# Patient Record
Sex: Male | Born: 1935 | Race: White | Hispanic: No | Marital: Married | Smoking: Former smoker
Health system: Southern US, Community
[De-identification: ages and names within clinical notes are randomized; demographics above are authoritative.]

## PROBLEM LIST (undated history)

## (undated) DIAGNOSIS — Z950 Presence of cardiac pacemaker: Secondary | ICD-10-CM

## (undated) DIAGNOSIS — J45909 Unspecified asthma, uncomplicated: Secondary | ICD-10-CM

## (undated) DIAGNOSIS — R600 Localized edema: Secondary | ICD-10-CM

## (undated) DIAGNOSIS — R32 Unspecified urinary incontinence: Secondary | ICD-10-CM

## (undated) DIAGNOSIS — E785 Hyperlipidemia, unspecified: Secondary | ICD-10-CM

## (undated) DIAGNOSIS — E119 Type 2 diabetes mellitus without complications: Secondary | ICD-10-CM

## (undated) DIAGNOSIS — C61 Malignant neoplasm of prostate: Secondary | ICD-10-CM

## (undated) DIAGNOSIS — G5603 Carpal tunnel syndrome, bilateral upper limbs: Secondary | ICD-10-CM

## (undated) DIAGNOSIS — I4891 Unspecified atrial fibrillation: Secondary | ICD-10-CM

## (undated) DIAGNOSIS — I5022 Chronic systolic (congestive) heart failure: Secondary | ICD-10-CM

## (undated) DIAGNOSIS — H9191 Unspecified hearing loss, right ear: Secondary | ICD-10-CM

## (undated) DIAGNOSIS — I495 Sick sinus syndrome: Secondary | ICD-10-CM

## (undated) DIAGNOSIS — Z974 Presence of external hearing-aid: Secondary | ICD-10-CM

## (undated) DIAGNOSIS — J189 Pneumonia, unspecified organism: Secondary | ICD-10-CM

## (undated) DIAGNOSIS — C9110 Chronic lymphocytic leukemia of B-cell type not having achieved remission: Secondary | ICD-10-CM

## (undated) DIAGNOSIS — I4821 Permanent atrial fibrillation: Secondary | ICD-10-CM

## (undated) DIAGNOSIS — R972 Elevated prostate specific antigen [PSA]: Secondary | ICD-10-CM

## (undated) DIAGNOSIS — R319 Hematuria, unspecified: Secondary | ICD-10-CM

## (undated) DIAGNOSIS — D494 Neoplasm of unspecified behavior of bladder: Secondary | ICD-10-CM

## (undated) DIAGNOSIS — I255 Ischemic cardiomyopathy: Secondary | ICD-10-CM

## (undated) DIAGNOSIS — Z8619 Personal history of other infectious and parasitic diseases: Secondary | ICD-10-CM

## (undated) DIAGNOSIS — I4510 Unspecified right bundle-branch block: Secondary | ICD-10-CM

## (undated) DIAGNOSIS — I2510 Atherosclerotic heart disease of native coronary artery without angina pectoris: Secondary | ICD-10-CM

## (undated) DIAGNOSIS — Z8669 Personal history of other diseases of the nervous system and sense organs: Secondary | ICD-10-CM

## (undated) DIAGNOSIS — I252 Old myocardial infarction: Secondary | ICD-10-CM

## (undated) DIAGNOSIS — I10 Essential (primary) hypertension: Secondary | ICD-10-CM

## (undated) DIAGNOSIS — M1990 Unspecified osteoarthritis, unspecified site: Secondary | ICD-10-CM

## (undated) HISTORY — PX: BACK SURGERY: SHX140

## (undated) HISTORY — DX: Chronic lymphocytic leukemia of B-cell type not having achieved remission: C91.10

## (undated) HISTORY — DX: Essential (primary) hypertension: I10

## (undated) HISTORY — PX: TOTAL HIP ARTHROPLASTY: SHX124

## (undated) HISTORY — PX: CARDIOVASCULAR STRESS TEST: SHX262

## (undated) HISTORY — PX: TONSILLECTOMY: SUR1361

## (undated) HISTORY — DX: Unspecified atrial fibrillation: I48.91

## (undated) HISTORY — PX: APPENDECTOMY: SHX54

## (undated) HISTORY — PX: OTHER SURGICAL HISTORY: SHX169

## (undated) HISTORY — PX: CARDIAC CATHETERIZATION: SHX172

## (undated) HISTORY — DX: Chronic systolic (congestive) heart failure: I50.22

## (undated) HISTORY — DX: Ischemic cardiomyopathy: I25.5

## (undated) HISTORY — DX: Sick sinus syndrome: I49.5

## (undated) HISTORY — DX: Hyperlipidemia, unspecified: E78.5

---

## 1998-06-22 HISTORY — PX: CARPAL TUNNEL RELEASE: SHX101

## 1999-09-03 ENCOUNTER — Ambulatory Visit (HOSPITAL_COMMUNITY): Admission: RE | Admit: 1999-09-03 | Discharge: 1999-09-03 | Payer: Self-pay | Admitting: Cardiovascular Disease

## 2001-05-16 ENCOUNTER — Encounter: Payer: Self-pay | Admitting: Internal Medicine

## 2001-05-16 ENCOUNTER — Encounter: Admission: RE | Admit: 2001-05-16 | Discharge: 2001-05-16 | Payer: Self-pay | Admitting: Internal Medicine

## 2001-11-07 ENCOUNTER — Ambulatory Visit (HOSPITAL_COMMUNITY): Admission: RE | Admit: 2001-11-07 | Discharge: 2001-11-07 | Payer: Self-pay | Admitting: Gastroenterology

## 2002-01-06 ENCOUNTER — Encounter: Payer: Self-pay | Admitting: Orthopedic Surgery

## 2002-01-06 ENCOUNTER — Encounter: Admission: RE | Admit: 2002-01-06 | Discharge: 2002-01-06 | Payer: Self-pay | Admitting: Orthopedic Surgery

## 2002-04-26 ENCOUNTER — Encounter: Payer: Self-pay | Admitting: Orthopedic Surgery

## 2002-04-26 ENCOUNTER — Encounter: Admission: RE | Admit: 2002-04-26 | Discharge: 2002-04-26 | Payer: Self-pay | Admitting: Orthopedic Surgery

## 2002-05-11 ENCOUNTER — Encounter: Payer: Self-pay | Admitting: Orthopedic Surgery

## 2002-05-11 ENCOUNTER — Encounter: Admission: RE | Admit: 2002-05-11 | Discharge: 2002-05-11 | Payer: Self-pay | Admitting: Neurosurgery

## 2002-05-26 ENCOUNTER — Encounter: Admission: RE | Admit: 2002-05-26 | Discharge: 2002-05-26 | Payer: Self-pay | Admitting: Orthopedic Surgery

## 2002-05-26 ENCOUNTER — Encounter: Payer: Self-pay | Admitting: Orthopedic Surgery

## 2002-09-26 ENCOUNTER — Encounter: Admission: RE | Admit: 2002-09-26 | Discharge: 2002-09-26 | Payer: Self-pay | Admitting: Orthopedic Surgery

## 2002-09-26 ENCOUNTER — Encounter: Payer: Self-pay | Admitting: Orthopedic Surgery

## 2002-09-27 ENCOUNTER — Encounter: Admission: RE | Admit: 2002-09-27 | Discharge: 2002-09-27 | Payer: Self-pay | Admitting: Orthopedic Surgery

## 2002-09-27 ENCOUNTER — Encounter: Payer: Self-pay | Admitting: Orthopedic Surgery

## 2002-10-20 ENCOUNTER — Ambulatory Visit (HOSPITAL_COMMUNITY): Admission: RE | Admit: 2002-10-20 | Discharge: 2002-10-20 | Payer: Self-pay | Admitting: Urology

## 2002-10-20 ENCOUNTER — Encounter: Payer: Self-pay | Admitting: Urology

## 2002-11-22 ENCOUNTER — Encounter: Payer: Self-pay | Admitting: Urology

## 2002-11-28 ENCOUNTER — Encounter (INDEPENDENT_AMBULATORY_CARE_PROVIDER_SITE_OTHER): Payer: Self-pay

## 2002-11-28 ENCOUNTER — Inpatient Hospital Stay (HOSPITAL_COMMUNITY): Admission: RE | Admit: 2002-11-28 | Discharge: 2002-11-30 | Payer: Self-pay | Admitting: Urology

## 2003-03-09 ENCOUNTER — Ambulatory Visit: Admission: RE | Admit: 2003-03-09 | Discharge: 2003-05-18 | Payer: Self-pay | Admitting: Radiation Oncology

## 2003-03-28 ENCOUNTER — Encounter: Payer: Self-pay | Admitting: Orthopedic Surgery

## 2003-03-28 ENCOUNTER — Encounter: Admission: RE | Admit: 2003-03-28 | Discharge: 2003-03-28 | Payer: Self-pay | Admitting: Orthopedic Surgery

## 2003-06-14 ENCOUNTER — Ambulatory Visit: Admission: RE | Admit: 2003-06-14 | Discharge: 2003-06-14 | Payer: Self-pay | Admitting: Radiation Oncology

## 2004-02-22 ENCOUNTER — Observation Stay (HOSPITAL_COMMUNITY): Admission: RE | Admit: 2004-02-22 | Discharge: 2004-02-23 | Payer: Self-pay | Admitting: Urology

## 2005-01-02 ENCOUNTER — Ambulatory Visit (HOSPITAL_COMMUNITY): Admission: RE | Admit: 2005-01-02 | Discharge: 2005-01-02 | Payer: Self-pay | Admitting: Gastroenterology

## 2006-07-15 ENCOUNTER — Inpatient Hospital Stay (HOSPITAL_COMMUNITY): Admission: RE | Admit: 2006-07-15 | Discharge: 2006-07-18 | Payer: Self-pay | Admitting: Orthopedic Surgery

## 2008-01-06 ENCOUNTER — Ambulatory Visit (HOSPITAL_COMMUNITY): Admission: RE | Admit: 2008-01-06 | Discharge: 2008-01-06 | Payer: Self-pay | Admitting: Cardiovascular Disease

## 2009-11-19 HISTORY — PX: CARPAL TUNNEL RELEASE: SHX101

## 2010-01-21 ENCOUNTER — Ambulatory Visit: Payer: Self-pay | Admitting: Cardiology

## 2010-02-18 ENCOUNTER — Ambulatory Visit: Payer: Self-pay | Admitting: Cardiovascular Disease

## 2010-03-18 ENCOUNTER — Ambulatory Visit: Payer: Self-pay | Admitting: Cardiovascular Disease

## 2010-04-15 ENCOUNTER — Ambulatory Visit: Payer: Self-pay | Admitting: Cardiovascular Disease

## 2010-04-22 ENCOUNTER — Ambulatory Visit: Payer: Self-pay | Admitting: Cardiology

## 2010-05-06 ENCOUNTER — Ambulatory Visit: Payer: Self-pay | Admitting: Cardiovascular Disease

## 2010-06-04 ENCOUNTER — Ambulatory Visit: Payer: Self-pay | Admitting: Cardiovascular Disease

## 2010-07-03 ENCOUNTER — Ambulatory Visit: Payer: Self-pay | Admitting: Cardiology

## 2010-07-23 HISTORY — PX: KNEE ARTHROSCOPY: SUR90

## 2010-07-29 ENCOUNTER — Encounter (INDEPENDENT_AMBULATORY_CARE_PROVIDER_SITE_OTHER): Payer: Medicare Other

## 2010-07-29 DIAGNOSIS — I4892 Unspecified atrial flutter: Secondary | ICD-10-CM

## 2010-07-29 DIAGNOSIS — Z7901 Long term (current) use of anticoagulants: Secondary | ICD-10-CM

## 2010-08-19 ENCOUNTER — Encounter (INDEPENDENT_AMBULATORY_CARE_PROVIDER_SITE_OTHER): Payer: Medicare Other

## 2010-08-19 DIAGNOSIS — Z7901 Long term (current) use of anticoagulants: Secondary | ICD-10-CM

## 2010-08-19 DIAGNOSIS — I4891 Unspecified atrial fibrillation: Secondary | ICD-10-CM

## 2010-09-16 ENCOUNTER — Ambulatory Visit (INDEPENDENT_AMBULATORY_CARE_PROVIDER_SITE_OTHER): Payer: Medicare Other | Admitting: *Deleted

## 2010-09-16 DIAGNOSIS — Z7901 Long term (current) use of anticoagulants: Secondary | ICD-10-CM

## 2010-09-16 DIAGNOSIS — I4891 Unspecified atrial fibrillation: Secondary | ICD-10-CM

## 2010-09-16 DIAGNOSIS — I480 Paroxysmal atrial fibrillation: Secondary | ICD-10-CM | POA: Insufficient documentation

## 2010-09-16 HISTORY — DX: Unspecified atrial fibrillation: I48.91

## 2010-09-16 LAB — POCT INR: INR: 2.1

## 2010-10-28 ENCOUNTER — Ambulatory Visit (INDEPENDENT_AMBULATORY_CARE_PROVIDER_SITE_OTHER): Payer: Medicare Other | Admitting: *Deleted

## 2010-10-28 DIAGNOSIS — I4891 Unspecified atrial fibrillation: Secondary | ICD-10-CM

## 2010-11-03 ENCOUNTER — Other Ambulatory Visit: Payer: Self-pay | Admitting: *Deleted

## 2010-11-03 MED ORDER — WARFARIN SODIUM 5 MG PO TABS: ORAL_TABLET | ORAL | Status: DC

## 2010-11-03 NOTE — Telephone Encounter (Signed)
Fax received from pharmacy. Refill completed. Jodette Amada Hallisey RN  

## 2010-11-04 ENCOUNTER — Other Ambulatory Visit: Payer: Self-pay | Admitting: *Deleted

## 2010-11-04 NOTE — Telephone Encounter (Signed)
Second refill paper/already done.Alfonso Ramus RN

## 2010-11-04 NOTE — H&P (Signed)
Oscar Baker, Oscar Baker NO.:  [id number]   MEDICAL RECORD NO.:  [id number]         PATIENT TYPE:  COIB   LOCATION:                               FACILITY:  [facility]   PHYSICIAN:  Vesta Mixer, M.D.      DATE OF BIRTH:   DATE OF ADMISSION:  01/06/2008  DATE OF DISCHARGE:                              HISTORY & PHYSICAL   Oscar Baker is an elderly gentleman with a history of sick sinus  syndrome.  He is admitted for elective cardioversion.   Oscar Baker has a history of bradycardia for quite some time.  He is  recently found to have atrial flutter.  He also has a history of  diabetes mellitus and hyperlipidemia.   Oscar has overall felt fairly well.  He has not had any episodes of chest  pain or shortness breath.  His rate has been well-controlled.  He has  not had any chest pain, syncope or presyncope.  He denies any PND,  orthopnea.   CURRENT MEDICATIONS:  1. Baby aspirin 81 mg a day.  2. Glucophage 1000 mg p.o. b.i.d.  3. Multivitamin once a day.  4. Vitamin B12 injections once a month.  5. Fish oil each day.  6. Simvastatin 40 mg a day.  7. Tricor 145 mg a day.  8. Glipizide 20 mg a day.  9. Ramipril 10 mg a day.  10.Coumadin 2.5 mg 2 days a week with 5 grams 5 days a week.   ALLERGIES:  None.   PAST MEDICAL HISTORY:  1. Atrial flutter with a well controlled ventricular response.  2. Diabetes mellitus.  3. Hypertension.   SOCIAL HISTORY:  The patient does not smoke and does not drink alcohol.   FAMILY HISTORY:  His family history is noncontributory.   REVIEW OF SYSTEMS:  Is as noted in the HPI.   PHYSICAL EXAMINATION:  GENERAL:  On exam, he is an elderly gentleman in  no acute distress.  He is alert and oriented x3.  Mood and affect are  normal.  VITAL SIGNS:  His weight is 252, blood pressure 120/70, with a heart  rate of 67.  HEENT:  Exam reveals 2+ carotids.  He has no bruits, no JVD, no  thyromegaly.  LUNGS:  Clear to auscultation.  HEART:   Regular rate, S1-S2.  ABDOMEN:  Exam reveals good bowel sounds and is nontender.  EXTREMITIES:  He has no clubbing, cyanosis, or edema.  NEURO:  Exam is nonfocal.   His EKG reveals atrial flutter with a well controlled ventricular  response.   His laboratory data is pending.  His INR is 2.8.   He presents with chronic atrial flutter.  We will schedule him for  elective cardioversion.  We have discussed risks, benefits, and options  of cardioversion.  He understands and agrees to proceed.           ______________________________  Vesta Mixer, M.D.     PJN/MEDQ  D:  01/03/2008  T:  01/04/2008  Job:  [id number]   cc:   Gwen Pounds, MD

## 2010-11-07 NOTE — Op Note (Signed)
NAME:  Oscar Baker, Oscar Baker                          ACCOUNT NO.:  [id number]   MEDICAL RECORD NO.:  [id number]                   PATIENT TYPE:  AMB   LOCATION:  DAY                                  FACILITY:  [facility]   PHYSICIAN:  Jamison Neighbor, M.D.               DATE OF BIRTH:  05-08-1933   DATE OF PROCEDURE:  02/22/2004  DATE OF DISCHARGE:                                 OPERATIVE REPORT   SERVICE:  Urology.   PREOPERATIVE DIAGNOSES:  Organic erectile dysfunction.   POSTOPERATIVE DIAGNOSES:  Organic erectile dysfunction.   PROCEDURE:  Cystoscopy and insertion of penile prosthesis.   SURGEON:  Jamison Neighbor, M.D.   ASSISTANT:  Thyra Breed, MD   ANESTHESIA:  General.   COMPLICATIONS:  Injury to urethra during dilation.   DRAINS:  16 French Councill tip Foley.   HISTORY:  This 77 year old male is status post radical prostatectomy. The  patient initially presented for evaluation of an elevated PSA as well as for  evaluation of erectile dysfunction. The patient was found to be diabetic and  had had longstanding problems with erections that predated surgery. The  patient has had a radical prostatectomy performed.  He is now interested in  having something done to correct his erectile dysfunction. He has been given  various options and has elected to have a rod style prosthesis placed.  He  understands the risks and benefits of the procedure.  Full informed consent  was obtained.   DESCRIPTION OF PROCEDURE:  After successful induction of general anesthesia,  the patient was placed in the supine position. He had a full 10 minute scrub  and painted with Betadine. Standard drapes were applied. The patient had a  subcoronal incision made and the penis was partially degloved. Corporotomies  were made on the right and left side. On the left hand side, dilation and  corporotomy was difficult and it was clear that the dilation had gone  outside of the corpora. The corporotomy was  repositioned and it turned out  that the dilators had been passing along side the corpora but not into the  corpora. When the corpora was repositioned, adequate dilation was obtained.  The other side was then dilated. The length turned out to be 19 cm, the  implant was selected and trimmed to that length and the device was placed  without difficulty. The corporotomies were irrigated and then closed with a  running suture of 2-0 Vicryl. The skin was closed with interrupted sutures  of 3-0 chromic. The Foley catheter was scheduled to be removed and blood was  noted at the meatus. It was thought that perhaps during the initial attempt  at dilation on the patient's right that perhaps the urethra had been injured  and it was felt that cystoscopy was warranted. Cystoscopy was performed,  urethra was visualized all the way down to the bladder. There was an area  of  false passage, this may have been due to the dilation and it may have been  due to initial passage of the Foley catheter which was somewhat tight  because of the patient's bladder neck.  In either case, this did not appear  to affect the corpora and appeared to be a completely separate incident from  the dilation and placement of the penile prosthesis.  Since there appeared  to be no connection whatsoever between the corpora and the urethra and since  this was a very small injury, it was felt that it would be safe to leave a  Foley catheter in place for approximately a week, obtain a voiding cystogram  and make sure that the patient had healed. The decision was made to place  the catheter.  It was felt that this was a small enough urethral injury that  it did not require suturing and it was all the way back at the bladder neck  where it would be very difficult to place a suture.  The penile prosthesis  was in no way involved with this injury and for that reason it was felt that  it was safe to leave this be. The patient had a new  catheter placed over a  wire to ensure that it was appropriate located within the bladder. The Foley  catheter was placed to drainage and drained clear urine. The patient had a  standard dressing of Xeroform gauze, dry gauze and Coban applied. The  patient tolerated the procedure well and was taken to the recovery room in  good condition.  He will have a 23 hour observation for close monitoring as  well as for control of his diabetes.  He will be sent home with antibiotic  coverage for 14 days and will have his Foley catheter removed in  approximately one week.                                               Jamison Neighbor, M.D.    RJE/MEDQ  D:  02/22/2004  T:  02/23/2004  Job:  [id number]

## 2010-11-07 NOTE — Procedures (Signed)
[place]  Patient:    Oscar Baker, Oscar Baker Visit Number: [id number] MRN: [id number]          Service Type: END Location: ENDO Attending Physician:  Orland Mustard Dictated by:   Llana Aliment. Randa Evens, M.D. Proc. Date: 11/07/01 Admit Date:  11/07/2001   CC:         Gwen Pounds, M.D.   Procedure Report  DATE OF BIRTH:  [birth year]  PROCEDURE:  Colonoscopy.  SURGEON:  James L. Edwards, M.D.  MEDICATIONS:  Fentanyl 80 mcg and Versed 8 mg IV.  SCOPE:  Olympus pediatric video colonoscope.  INDICATIONS:  Previous history of adenomatous colon polyps.  DESCRIPTION OF PROCEDURE:  The procedure had been explained to the patient and consent obtained.  With the patient in the left lateral decubitus position, the scope was inserted and advanced under direct visualization.  The prep was marginal, particularly the right colon was somewhat dirty.  Using abdominal pressure and position changes with the patient in the right lateral decubitus position, we were able to reach the cecum.  The ileocecal valve was seen.  The scope was withdrawn, and again, the cecum was somewhat dirty with sticky adherent stool, as was the ascending colon.  A small polyp could have been missed.  No gross lesions were seen.  The transverse colon, descending, and sigmoid colon were seen well, and no significant diverticular disease, polyps, or other lesions.  The scope was withdrawn down to the rectum and the rectum was free of polyps.  The patient tolerated the procedure well and was maintained on low flow oxygen and pulse oximeter throughout the procedure.  ASSESSMENT:  No obvious polyps in this high risk individual.  PLAN: 1. Will recommend repeating in three years and will plan on using the adult    scope at that time. 2. Yearly Hemoccults. Dictated by:   Llana Aliment. Randa Evens, M.D. Attending Physician:  Orland Mustard DD:  11/07/01 TD:  11/08/01 Job:  [id number]

## 2010-11-07 NOTE — Op Note (Signed)
NAMECHRISTION, LEONHARD                ACCOUNT NO.:  [id number]   MEDICAL RECORD NO.:  [id number]          PATIENT TYPE:  AMB   LOCATION:  ENDO                         FACILITY:  [facility]   PHYSICIAN:  James L. Malon Kindle., M.D.DATE OF BIRTH:  [birth year]   DATE OF PROCEDURE:  01/02/2005  DATE OF DISCHARGE:                                 OPERATIVE REPORT   PROCEDURE:  Colonoscopy.   MEDICATIONS:  Fentanyl 100 mcg, Versed 10 mg IV.   ENDOSCOPE:  Pediatric adjustable colonoscope.   INDICATIONS:  Previous history of colon polyps.   DESCRIPTION OF PROCEDURE:  The procedure explained to the patient and  consent obtained.  With the patient in the left lateral decubitus position,  the Olympus pediatric scope was inserted and advanced.  The patient had a  long, tortuous colon, and abdominal pressure and finally in the supine and  then the right lateral decubitus position we were able to reach the cecum.  The ileocecal valve was identified.  The scope was withdrawn and the colon  carefully examined upon withdrawal.  No polyps or other lesions seen.  No  significant diverticulosis.  The scope withdrawn.  The patient tolerated the  procedure well.   ASSESSMENT:  History of colon polyps with negative colonoscopy at this time,  V12.72.   Recommend yearly Hemoccults, repeat procedure in five years.       JLE/MEDQ  D:  01/02/2005  T:  01/02/2005  Job:  [id number]   cc:   Gwen Pounds, MD  Fax: [phone]

## 2010-11-07 NOTE — H&P (Signed)
Oscar Baker, Oscar Baker                ACCOUNT NO.:  [id number]   MEDICAL RECORD NO.:  [id number]         PATIENT TYPE:  LINP   LOCATION:                               FACILITY:  [facility]   PHYSICIAN:  Madlyn Frankel. Charlann Boxer, M.D.  DATE OF BIRTH:  [birth year]   DATE OF ADMISSION:  07/15/2006  DATE OF DISCHARGE:                              HISTORY & PHYSICAL   Procedure will be right total hip replacement   CHIEF COMPLAINTS:  Right hip pain.   HISTORY OF PRESENT ILLNESS:  This is a 77 year old male with a history  of right hip pain secondary to osteoarthritis.  He has been refractory  to all conservative treatment.  Due to his decreased quality of life he  has been scheduled for a right total hip replacement by surgeon Dr.  Durene Romans.   PAST MEDICAL HISTORY INCLUDES:  1. Asthma  2. Coronary artery disease.  3. Prostate cancer.  4. Diabetes type 2.  5. Osteoarthritis.   PAST SURGICAL HISTORY INCLUDES:  1,  Appendectomy early 1950.  1. Back surgery in 1960.  2. Knee surgery in late 1990.  3. Hand surgery in early.  4. Prostate cancer removal with radiation in 2004.  5. Penile prosthesis in 2005.   FAMILY HISTORY INCLUDES:  Heart disease, diabetes, arthritis, and  pulmonary fibrosis.   DRUG ALLERGIES:  CODEINE.   SOCIAL HISTORY:  He is married, retired.  Primary caregiver will be his  wife Oscar Baker.   MEDICATIONS INCLUDE:  1. Metformin 2000 mg.  2. Glipizide XL.  3. Altace.  4. Crestor.  5. Aspirin 81.  6. __________ .  7. Fish oil.  8. Furosemide as needed p.r.n.  9. Stool softener with laxative.   REVIEW OF SYSTEMS:  He has had no new signs or symptoms of any  cardiovascular, respiratory, gastrointestinal, genitourinary,  neurological, or musculoskeletal system complaints in the past 2 weeks.   PHYSICAL EXAM:  VITAL SIGNS:  Temperature 98.2, pulse 64, respirations  18, blood pressure 132/74.  GENERAL:  Awake, alert and oriented, well-developed, well-nourished male  in no acute distress.  NECK:  No carotid bruits.  Supple.  CHEST:  Lungs clear to auscultation bilaterally.  BREASTS:  Deferred.  HEART:  Regular rate and rhythm without gallops, clicks, rubs or  murmurs.  ABDOMEN:  Soft, nontender, nondistended.  Bowel sounds present.  GENITOURINARY:  Deferred.  EXTREMITIES:  Limited range of motion.  SKIN:  No cellulitis.  NEUROLOGIC:  Intact distal sensibilities.   LABS:  Pending.   X-RAYS:  Reviewed pelvis and right hip.   IMPRESSION:  Right hip osteoarthritis.   PLAN OF ACTION:  1. Right total hip replacement by Dr. Durene Romans on 07/15/2006 at      [facility].  The risks and complications were discussed.  2. Questions were encouraged answered and reviewed.  Look forward to      treating this very pleasant 77 year old male.     ______________________________  Yetta Glassman. Loreta Ave, [place]      Madlyn Frankel. Charlann Boxer, M.D.  Electronically Signed    BLM/MEDQ  D:  06/30/2006  T:  06/30/2006  Job:  [id number]   cc:   Gwen Pounds, MD  Fax: [phone]

## 2010-11-07 NOTE — Discharge Summary (Signed)
Oscar Baker, CORMAN                ACCOUNT NO.:  [id number]   MEDICAL RECORD NO.:  [id number]          PATIENT TYPE:  INP   LOCATION:  1514                         FACILITY:  [facility]   PHYSICIAN:  Madlyn Frankel. Charlann Boxer, M.D.  DATE OF BIRTH:  [birth year]   DATE OF ADMISSION:  07/15/2006  DATE OF DISCHARGE:  07/18/2006                               DISCHARGE SUMMARY   ADMITTING DIAGNOSES:  1. Osteoarthritis.  2. Diabetes type 2.  3. Coronary atherosclerosis of native coronary vessel.  4. Prostatic malignancy.   DISCHARGE DIAGNOSES:  1. Osteoarthritis.  2. Diabetes type 2.  3. Coronary atherosclerosis.  4. Prostatic malignancy.  5. Acute blood loss anemia.   CONSULTS:  None.   PROCEDURE:  Right total hip replacement.   SURGEON:  Dr. Durene Romans.   ASSISTANT:  Dwyane Luo, PA-C.   BRIEF HISTORY OF PRESENT ILLNESS:  This is a 76 year old male with right  hip pain secondary to osteoarthritis.  He has been refractory to  conservative treatment.  Due to decreased quality of life, he was  scheduled for a total hip replacement.   LABS:  Preadmission CBC showed hemoglobin 15.1, hematocrit 43.4.  Postoperatively hemoglobin 12.2, hematocrit 35.0.  Prior to discharge,  hemoglobin 12.8, hematocrit 37.7.  Preadmission white cell differential within normal limits.  Preadmission coagulation INR 1.0.  Preadmission chemistry showed his sodium 141, potassium 4.5, glucose  156.  Postoperatively, sodium 132, potassium 4.6, glucose 250.  On  August 17, 2006, sodium 132, potassium 3.9, glucose 199.  Urinalysis showed trace hemoglobin, few epithelials, otherwise negative.  Chest two-view showed chronic changes in the lung base without change.  EKG normal sinus rhythm.   HOSPITAL COURSE:  The patient underwent total hip replacement.  Discharged from PACU to orthopedic floor, tolerated procedure well.  On  postop day #1, the patient was doing well.  No complaints.  He was  afebrile.  He was stable  hemodynamically.  His dressing was clean, dry  and intact.  He was neuromuscular vascular intact.  PT OT was begun.  PT  evaluation documented, recommended home health care physical therapy.  Diabetes treatment program recommendations were made when his glucose  hit 243.  He was doing better; catheter was pulled out.  He was  afebrile.  His dressing was changed.  His wound was clean and dry.  He  was neuromuscular and vascularly intact.  Pain was well-controlled.  PT,  OT increased out of bed activity.  We are going to follow up on his labs  due to his sodium being 132, and we had increased his juice and soda.  Occupational therapy was completed.  Determined future OT needs.  Postop  day #3, the patient was doing very well, out of bed with use of a  walker.  Vital signs were stable.  He was afebrile.  He was  neuromuscular and vascularly intact.  His calf was soft and nontender.  He was ready to be discharged home.   DISCHARGE DISPOSITION:  Discharged home, health care physical therapy,  stable and improved condition.   DISCHARGE MEDICATIONS:  1.  Lovenox 40 mg 1 subcu q. 24 x11 days.  2. Robaxin 500 mg 1 p.o. q. 6 p.r.n. muscle spasm.  3. Norco 5/325, 1-2 p.o. q. 4-6 p.r.n. pain.  4. Enteric-coated aspirin 325 mg 1 daily after Lovenox completed.  5. Colace 100 mg 1 p.o. b.i.d. constipation.  6. Iron 325 mg 1 p.o. t.i.d.   DISCHARGE PHYSICAL THERAPY:  The patient is partial weightbearing 50%.  The use of rolling walker transition, transition to single-point cane  after 2 weeks.  Goals of therapy will be to minimize pain, maximize  muscle strength, increase range of motion encourage independence in  activities of daily living.   WOUND CARE:  Keep wound dry.  Change dressing daily.   DISCHARGE FOLLOWUP:  Follow with Dr. Charlann Boxer, at the office in 2 weeks for  wound care check.   DISCHARGE HOME MEDICATIONS:  1. Continuation of metformin 1000 mg 1 p.o. q.a.m. q.p.m.  2. Fish oil 2 p.o.  q.a.m.  3. Multivitamin 1 p.o. q.a.m.  4. Glipizide 10 mg, 1 p.o. q.a.m.  5. Crestor 10 mg 1 p.o. q.h.s.  6. Ramipril 10 mg 1 p.o. q.h.s.  7. Stool softener p.r.n.   DISCHARGE SPECIAL INSTRUCTIONS:  If the patient develops severe calf  pain or shortness of breath, please call emergency service immediately.  Otherwise, we will see back this patient in 2 weeks.     ______________________________  Yetta Glassman [address]ta [address], [place]      Madlyn Frankel. Charlann Boxer, M.D.  Electronically Signed    BLM/MEDQ  D:  08/27/2006  T:  08/27/2006  Job:  [id number]

## 2010-11-07 NOTE — Op Note (Signed)
Oscar Baker, Oscar Baker                ACCOUNT NO.:  [id number]   MEDICAL RECORD NO.:  [id number]          PATIENT TYPE:  INP   LOCATION:  0006                         FACILITY:  [facility]   PHYSICIAN:  Madlyn Frankel. Charlann Boxer, M.D.  DATE OF BIRTH:  Nov 29, 1932   DATE OF PROCEDURE:  07/15/2006  DATE OF DISCHARGE:                               OPERATIVE REPORT   PREOPERATIVE DIAGNOSIS:  End-stage right hip osteoarthritis.   POSTOPERATIVE DIAGNOSIS:  End-stage right hip osteoarthritis.   PROCEDURE:  Right total hip replacement.   COMPONENTS USED:  DePuy hip system size 56 ASR cup, Summit dual fix size  9 high offset Summit stem with a +5 adapter and a 50 ASR cup.   SURGEON:  Madlyn Frankel. Charlann Boxer, M.D.   ASSISTANT:  Yetta Glassman. [address]oreta [address], PA-C.   ANESTHESIA:  Spinal plus MAC.   BLOOD LOSS:  500.   DRAINS:  None.   COMPLICATIONS:  None.   INDICATIONS FOR PROCEDURE:  [name] is a pleasant, 77 year old  gentleman who I have been following for some time for right hip pain.  He does have some coexisting lumbar degenerative changes but has had  problems with his right hip for some time. He has had limited range of  motion with painful examination.  At this point, we discussed  nonoperative intervention versus hip replacement options. The risks and  benefits of infection, dislocation, component failure, and need for  revision were all discussed and consent obtained for total hip  replacement.   PROCEDURE IN DETAIL:  The patient was brought to the operative theater.  Once adequate anesthesia and preoperative antibiotics, 2 grams of Ancef,  were administered, the patient was positioned in the left lateral  decubitus position, right side up.  The right lower extremity was  prescrubbed and prepped and draped in a sterile fashion. A lateral based  incision was made for a posterior approach to the hip. The iliotibial  band and gluteus fascia were incised in line with the incision.  The  short external  rotators were identified and taken down separate from the  posterior capsule.  An L capsulotomy was then made with the posterior  leaflet saved for later anatomic repair.  The hip was dislocated,  landmarks were identified noting that the femoral head was above the tip  of the trochanter and consistent with his radiographs.  Neck osteotomy  was made in relationship to this.   I then exposed the femur and prepared the femur for the Summit stem  including straight axial conical reaming all the way to a size 9 reamer  and then broached starting with a size 6 up to a 9.   At this point, this was packed and attention directed to the acetabulum.  Acetabular exposure was obtained including labrectomy removing any  further debris from the fovea.  I then used a 5/8 inch osteotome and  debrided the posterior inferior osteophyte with an ossified transverse  ligament.  This helped to identify posterior column landmarks.   I then reamed beginning with a 47 reamer and I carried this up all the  way with curved reamers to a 55 reamer.  Using excellent bony bed  preparation, the landmarks were identified for impaction of the cup.  The final 56 ASR cup was then impacted to the level of what was  identified on the anterior wall.  The position of the cup was at 40  degrees of abduction and 20 degrees of forward flexion.   At this point, attention was redirected back to the femur for a trial  reduction with a size 9 broach impacted at the neck cut.  Using the high  offset neck and a +2 adapter initially, a trial reduction was carried  out. The patient's hip mobility improved significantly following his  joint replacement and removal of his arthritic joint.  His range of  motion was very secure with hip flexion, internal rotation to 80 degrees  as well as in the sleep position.  There was a couple millimeters of  shuck within extension and then leg lengths appeared to be within normal  limits.   With  this, I went ahead and removed these trial components and the final  9 high-offset stem was impacted. It was impacted relatively close to  where the broach was if not a little bit more than a millimeter or so  lower. I went ahead and trialed a +5 adapter given this and felt that in  extension that there was still a millimeter or 2 of shuck and the hip  stability was unchanged.  Given all these parameters, I went ahead and  chose the +5 adapter with the 50 ASR ball.   The adapter and head were then impacted onto a clean and dry trunnion  and the hip reduced.  The patient tolerated the procedure well without  complication.  His posterior capsule was reapproximated to the superior  leaflet using a #1 Ethibond.  The iliotibial band was reapproximated  using Ethibond, #1 Vicryl was used on the gluteus fascia in the subcu  layer with 2-0 Vicryl and 4-0 Monocryl on the subcu layer.  The  patient's hip was cleaned, dried and dressed sterilely with Steri-Strips  and a tape dressing.  He is brought to the recovery room stable.      Madlyn Frankel Charlann Boxer, M.D.  Electronically Signed     MDO/MEDQ  D:  07/15/2006  T:  07/15/2006  Job:  [id number]

## 2010-11-07 NOTE — H&P (Signed)
NAME:  Oscar Baker, Oscar Baker                          ACCOUNT NO.:  [id number]   MEDICAL RECORD NO.:  [id number]                   PATIENT TYPE:  INP   LOCATION:  0006                                 FACILITY:  [facility]   PHYSICIAN:  Jamison Neighbor, M.D.               DATE OF BIRTH:  1932/07/12   DATE OF ADMISSION:  11/28/2002  DATE OF DISCHARGE:                                HISTORY & PHYSICAL   ADMISSION DIAGNOSIS:  Carcinoma of the prostate.   SECONDARY DIAGNOSES:  1. Diabetes.  2. Arthritis.  3. Bradycardia.   HISTORY:  This 77 year old male is being admitted for radical prostatectomy  for treatment of carcinoma of the prostate.  The patient had a PSA of 10.45.  He underwent biopsy and found to have Gleason score of 8 on the left-hand  side involving 50% of the biopsy, Gleason score of 7 on the right-hand side  involving 20% of the biopsy.  The patient was staged with a CT scan and bone  scan that were negative.  He was given all the various options of therapy  including watchful waiting, surgery, various forms of radiation therapy,  chemotherapy, cryosurgery, and hormonal manipulation, and he elected to  undergo surgery.  He is aware of the risks for impotence and incontinence.  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  The patient gave full and informed consent.  He will be admitted following  that procedure.   PAST MEDICAL HISTORY:  1. Bradycardia.  2. Cardiac catheterization in the past that apparently was unremarkable.  He     has had no significant problems from this.  3. He had childhood asthma, no problems as an adult.  4. He does have arthritis, and this was also evident from the bone scan.  5. The patient is a known diabetic.   CURRENT MEDICATIONS:  1. Metformin 100 mg b.i.d.  2. Advicor 2 daily.  3. Glipizide standard release 5 mg daily.  4. Altace 10 mg daily.   SOCIAL HISTORY:  The patient drinks rare amounts of alcohol.  He  stopped  smoking 15 years ago, previously smoked for 20 years.   REVIEW OF SYSTEMS:  Pertinent for the arthritis changes as well as some  symptoms of bladder outlet obstruction and nocturia.  He has no significant  issues associated with the diabetes.   PHYSICAL EXAMINATION:  VITAL SIGNS:  Temperature 97.1, pulse 34,  respirations 15, blood pressure 140/89. Weight 251.  GENERAL: Well developed, well nourished male.  HEENT:  Normocephalic and atraumatic.  Cranial nerves II-XII grossly intact.  NECK:  Supple with no adenopathy or thyromegaly.  LUNGS:  Clear.  HEART:  Regular rate and rhythm.  No murmurs, thrills, gallops, rubs, or  heaves.  ABDOMEN:  Soft, nontender, with no palpable masses, rebound, or guarding.  GU:  The testicles were normal size, shape, and consistency.  Penis  was free  of any lesions.  RECTAL:  A 4+ prostate, slightly irregular on the left-hand side.  The  sphincter tone was normal.  EXTREMITIES:  No cyanosis, clubbing, or edema.   IMPRESSION:  Carcinoma of the prostate, bilateral.   PLAN:  Admit following bilateral pelvic lymph node dissection and radical  retropubic  prostatectomy.                                               Jamison Neighbor, M.D.    RJE/MEDQ  D:  11/28/2002  T:  11/28/2002  Job:  [id number]

## 2010-11-07 NOTE — Cardiovascular Report (Signed)
[place]  Patient:    Oscar Baker, Oscar Baker                       MRN: [id number] Proc. Date: 09/03/99 Adm. Date:  [id number] Disc. Date: [id number] Attending:  Koren Bound CC:         Cardiac Catheterization Laboratory                        Cardiac Catheterization  INDICATIONS:  [name] is a middle-aged gentleman with a history of bradycardia. He recently had a Cardiolite study which revealed anterior apical ischemia.  He is referred for heart catheterization for further evaluation.  DESCRIPTION OF PROCEDURE:  The right femoral artery was easily cannulated using the modified Seldinger technique.  HEMODYNAMICS:  The LV pressure was 139/5 and aortic pressure of 139/75.  ANGIOGRAPHY:  The Judkins left wall catheter was initially placed up into the aortic root but we were not able to find a left main coronary artery.  At this point the right coronary artery catheter was placed up in the right coronary artery.  Initial angiography revealed an anomalous circulation with both the right coronary artery and the left main arising from the right coronary cusp.  The right coronary artery is a fairly large and dominant vessel.  There are moderate irregularities throughout the course of the vessel.  The tightest stenosis is approximately 20-30%.  The posterior descending artery and posterolateral segment artery are relatively normal.  A Swan-Ganz catheter was placed up into the pulmonary circulation to help determine the course of the left main.  The left anterior descending artery is seen fairly well.  The left main does not appear to go between the pulmonary trunk and aorta.  Left main appears to go up  over the pulmonary trunk and gives rise to a relatively small left anterior descending artery.  This LAD seems to taper normally and supplies multiple septals.  The left circumflex artery is a moderate sized vessel and supplies  basically an  obtuse marginal distribution.  He has a few minor luminal irregularities but no  critical stenoses within this circulation.  COMPLICATIONS:  None.  CONCLUSIONS: 1. 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 2. Minor luminal irregularities but no critical coronary artery stenoses.  We will    continue with medical therapy.  The anterior wall perfusion defect probably s    due to his relatively small left anterior descending artery.  We will continue    with medical therapy. DD:  10/13/99 TD:  10/13/99 Job: [id number]

## 2010-11-07 NOTE — Op Note (Signed)
NAME:  Oscar Baker, Oscar Baker                          ACCOUNT NO.:  [id number]   MEDICAL RECORD NO.:  [id number]                   PATIENT TYPE:  INP   LOCATION:  0006                                 FACILITY:  [facility]   PHYSICIAN:  Jamison Neighbor, M.D.               DATE OF BIRTH:  28-Nov-1932   DATE OF PROCEDURE:  11/28/2002  DATE OF DISCHARGE:                                 OPERATIVE REPORT   PREOPERATIVE DIAGNOSIS:  Carcinoma of the prostate.   POSTOPERATIVE DIAGNOSIS:  Carcinoma of the prostate.   PROCEDURE:  Radical retropubic prostatectomy with bilateral pelvic lymph  node dissection.   SURGEON:  Jamison Neighbor, M.D.   ASSISTANTS:  Lindaann Slough, M.D., and Melvyn Novas, M.D.   ANESTHESIA:  General.   COMPLICATIONS:  None.   DRAINS:  A 20 French Silastic Foley catheter and #10 flat Blake drain.   HISTORY:  This 77 year old male underwent ultrasound biopsy because of an  elevated PSA.  He was found to have Gleason score 7 adenocarcinoma in 20% of  the biopsy on the right and Gleason score 8 in 50% of the biopsy on the  left.  Because the patient had a PSA of 10.45 (over 10) and had Gleason  score 7 and 8, it was felt that CT staging and bone scan were appropriate.  The patient had a bone scan performed, and this showed evidence of  degenerative changes in his shoulders and spine but no evidence of any bony  metastatic disease.  The CT scan showed some irregularity of the prostate  but no evidence of involvement of the seminal vesicles or nodes.  The  patient was apprised of various treatment options.  We discussed external  beam radiation therapy, seed implantation, combination therapy, hormone  therapy, cryosurgery, chemotherapy, as well as surgery.  The patient felt  that surgery was more appropriate for him.  He does understand that there  certainly is the risk that he will have a recurrent and/or positive margins  and may require additional radiation therapy.  He is  also aware that hormone  therapy or chemotherapy may be necessary in the future.  The patient is  aware of the risks for impotence and incontinence.  He was specifically told  that with his extensive disease, he was not a candidate for a nerve-sparing  option, and it is already noted that because of diabetes he does not have  good erections at this time.  The patient is aware of the risk for  incontinence as well as the risk for injury to adjacent structures such as  rectum.  Full and informed consent was obtained.   PROCEDURE:  After successful induction of general anesthesia, the patient  was placed in the supine position and prepped with Betadine and draped in  the usual sterile fashion.  A slight break was made and the patient was  placed into a  Trendelenburg position in order to fully stretch the lower  abdomen.  A lower midline incision was made from the umbilicus down through  the symphysis pubis.  This was carried down to the rectus sheath.  The  rectus sheath was opened in the midline and the rectus muscles were split.  The space of Retzius was identified and developed.  The Bookwalter retractor  was placed in preparation for the bilateral pelvic lymph node dissection.  The two pelvic lymph node dissections were essentially identical.  On each  side the packet was taken from the underside of the vein and the obturator  nerve was exposed and fully skeletonized.  The packets were controlled with  clips.  The specimens were sent for permanent section.  On the patient's  left-hand side the iliac vein was noted to be somewhat tortuous, but there  was not an aneurysm detected.  The endopelvic fascia was then opened on each  side and that dissection proceeded up to the puboprostatic ligaments.  The  puboprostatic ligaments were taken down bilaterally.  A stitch of 0 chromic  was used to prevent back bleeding.  The Hohenfellner was then placed between  the urethra and the dorsal vein  complex.  The dorsal vein complex was tied  off with two separate sutures of 0 Vicryl.  The dorsal vein was then  transected.  The resultant bundle was oversewn with a Vicryl suture.  The  urethra was cut on the top surface.  The Foley catheter was pulled out  through the cut urethra and the back wall of the urethra was cut.  The  prostate was elevated and the appropriate plane was developed.  The lateral  pedicle was taken down with a wide excision with no attempt at nerve-  sparing.  The seminal vessels were identified and exposed.  They were  controlled with clips and fully dissected free.  The vasa were clipped and  divided.  The remainder of the pedicle was taken down with ties and  electrocautery.  The bladder neck was then preserved as much as possible and  the entire specimen was removed.  A little thickened tissue near the bladder  neck was removed and was sent as a separate specimen even though this  clearly did not appear to be prostatic in origin.  The specimen was  carefully inspected and appeared to be an intact prostate with no obvious  breaches of the capsule.  Seminal vesicles were unremarkable with no signs  of tumor entering either seminal vesicle.  One stitch was placed in the  bladder neck to tighten it slightly.  The mucosa was then everted with 4-0  Vicryl sutures.  Double-armed Monocryl sutures were used to perform the  anastomosis.  Sutures were placed at the 12 o'clock, 2 o'clock, 5 o'clock, 7  o'clock, and 10 o'clock positions.  These were then placed in the  appropriate location on the bladder and a Foley catheter was inserted and  inflated.  The sutures were tied down, and good coaptation was obtained.  The catheter irrigated, and there was no leak.  The patient had a drain  placed through a small stab incision.  This was sutured in place with a silk  suture.  The fascia was closed with a running suture of #1 PDS.  The skin was closed with surgical clips.  The  patient tolerated the procedure well  and was taken to the recovery room in good condition.  Jamison Neighbor, M.D.    RJE/MEDQ  D:  11/28/2002  T:  11/28/2002  Job:  [id number]   cc:   Gwen Pounds, M.D.  [address]  [place]  [place] [address]  Fax: [phone]

## 2010-11-07 NOTE — Discharge Summary (Signed)
   NAME:  Oscar Baker, Oscar Baker                          ACCOUNT NO.:  [id number]   MEDICAL RECORD NO.:  [id number]                   PATIENT TYPE:  INP   LOCATION:  0356                                 FACILITY:  [facility]   PHYSICIAN:  Jamison Neighbor, M.D.               DATE OF BIRTH:  10-25-1932   DATE OF ADMISSION:  11/28/2002  DATE OF DISCHARGE:  11/30/2002                                 DISCHARGE SUMMARY   ADMISSION DIAGNOSES:  1. Carcinoma of the prostate.  2. Diabetes mellitus.  3. Hypertension.  4. Osteoarthritis.   DISCHARGE DIAGNOSES:  1. Carcinoma of the prostate.  2. Diabetes mellitus.  3. Hypertension.  4. Osteoarthritis.   HISTORY:  This 77 year old male was admitted for radical prostatectomy for  treatment of carcinoma of the prostate.  The patient had a PSA of 10.45 and  a Gleason score 8 on the left involving 50% of the biopsy, and Gleason score  7 on the right, involving 20% of the biopsy.  CT scan and bone scan were  negative.  The patient elected to undergo radical prostatectomy after  careful counseling of all the appropriate options.   PAST MEDICAL HISTORY:  1. Bradycardia.  2. Coronary artery disease.  3. Childhood asthma.  4. Diabetes.  5. Arthritis.   MEDICATIONS ON ADMISSION:  Metformin, Advicor, Glipizide, and Altace.   Social history, family history, and review of systems are well-defined on  the initial history and physical.   HOSPITAL COURSE:  The patient was taken to the operating room where he  underwent a successful radical prostatectomy including bilateral pelvic  lymph node dissection.  His postoperative course was unremarkable.  He was  rapidly advanced to a regular diet.  He had a bowel movement without any  difficulty.  His sugars were slightly elevated compared to baseline, much of  which we ascribe to the stress of the procedure.  He was ready for discharge  on postoperative day #2.  The Jackson-Pratt drain was removed, and the  patient  was sent home with a prescription for Tylox as well as Septra-DS  until the catheter is removed.  He is advised to use stool softeners and  laxatives as needed.  The patient will return in one week for staple removal  as well as possible Foley catheter removal.                                              Jamison Neighbor, M.D.   RJE/MEDQ  D:  12/05/2002  T:  12/05/2002  Job:  [id number]

## 2010-11-10 ENCOUNTER — Other Ambulatory Visit: Payer: Self-pay | Admitting: Cardiovascular Disease

## 2010-11-10 NOTE — Telephone Encounter (Signed)
Refill for Coumadin

## 2010-11-24 ENCOUNTER — Other Ambulatory Visit: Payer: Self-pay | Admitting: *Deleted

## 2010-11-24 DIAGNOSIS — E785 Hyperlipidemia, unspecified: Secondary | ICD-10-CM

## 2010-11-25 ENCOUNTER — Ambulatory Visit (INDEPENDENT_AMBULATORY_CARE_PROVIDER_SITE_OTHER): Payer: Medicare Other | Admitting: Cardiology

## 2010-11-25 ENCOUNTER — Other Ambulatory Visit (INDEPENDENT_AMBULATORY_CARE_PROVIDER_SITE_OTHER): Payer: Medicare Other | Admitting: *Deleted

## 2010-11-25 ENCOUNTER — Encounter (INDEPENDENT_AMBULATORY_CARE_PROVIDER_SITE_OTHER): Payer: Medicare Other | Admitting: *Deleted

## 2010-11-25 ENCOUNTER — Encounter: Payer: Self-pay | Admitting: Cardiology

## 2010-11-25 ENCOUNTER — Encounter: Payer: Self-pay | Admitting: *Deleted

## 2010-11-25 ENCOUNTER — Encounter: Payer: Self-pay | Admitting: Cardiovascular Disease

## 2010-11-25 DIAGNOSIS — E785 Hyperlipidemia, unspecified: Secondary | ICD-10-CM

## 2010-11-25 DIAGNOSIS — I4891 Unspecified atrial fibrillation: Secondary | ICD-10-CM

## 2010-11-25 DIAGNOSIS — I495 Sick sinus syndrome: Secondary | ICD-10-CM

## 2010-11-25 LAB — LIPID PANEL
Cholesterol: 179 mg/dL (ref 0–200)
HDL: 32.3 mg/dL — ABNORMAL LOW (ref >39.00)
Total CHOL/HDL Ratio: 6
Triglycerides: 137 mg/dL (ref 0.0–149.0)
VLDL: 27.4 mg/dL (ref 0.0–40.0)

## 2010-11-25 LAB — BASIC METABOLIC PANEL
BUN: 23 mg/dL (ref 6–23)
CO2: 24 mEq/L (ref 19–32)
Chloride: 110 mEq/L (ref 96–112)
GFR: 77.15 mL/min (ref >60.00)
Sodium: 139 mEq/L (ref 135–145)

## 2010-11-25 LAB — HEPATIC FUNCTION PANEL
Bilirubin, Direct: 0.1 mg/dL (ref 0.0–0.3)
Total Bilirubin: 0.6 mg/dL (ref 0.3–1.2)

## 2010-11-25 LAB — POCT INR: INR: 2.1

## 2010-11-25 NOTE — Assessment & Plan Note (Signed)
When is doing fairly well. I've asked him numerous times and in several different ways if he is having any episodes of syncope. He continues to be asymptomatic. We'll continue to keep a close eye on him and will refer him for pacemaker if he develops any episodes of syncope. At present I don't think that a pacemaker is indicated.

## 2010-11-25 NOTE — Progress Notes (Signed)
Oscar Baker Date of Birth  11-Oct-1932 [facility] [address]     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 He has a long history of atrial fibrillation/atrial flutter. He has had some breath episodes of bradycardia but has never had any syncope or presyncope. He notes that he is a little bit more fatigued than usual but has never had any episodes of syncope or presyncope. He also has a history of hypertension and hyperlipidemia  Current Outpatient Prescriptions  Medication Sig Dispense Refill  . aspirin 81 MG tablet Take 81 mg by mouth daily.        Tery Sanfilippo Calcium (STOOL SOFTENER PO) Take by mouth as needed.        . fenofibrate micronized (LOFIBRA) 200 MG capsule Take 1 capsule by mouth Daily.      . fish oil-omega-3 fatty acids 1000 MG capsule Take 2 g by mouth daily.        [name] glipiZIDE (GLUCOTROL) 10 MG tablet Take 5 mg by mouth daily.       . metFORMIN (GLUCOPHAGE) 1000 MG tablet Take 1,000 mg by mouth 2 (two) times daily with a meal.        . multivitamin (THERAGRAN) per tablet Take 1 tablet by mouth daily.        . ramipril (ALTACE) 10 MG capsule Take 10 mg by mouth daily.        . sitaGLIPtan (JANUVIA) 100 MG tablet Take 50 mg by mouth daily.       . vitamin B-12 (CYANOCOBALAMIN) 1000 MCG tablet Take 1,000 mcg by mouth daily.        [name] warfarin (COUMADIN) 5 MG tablet TAKE AS DIRECTED  90 tablet  3  . DISCONTD: Cyanocobalamin (VITAMIN B-12 IJ) Inject as directed every 3 (three) months.       . simvastatin (ZOCOR) 40 MG tablet Take 40 mg by mouth at bedtime. ( NOT TAKING ) cause d leg pain      . DISCONTD: furosemide (LASIX) 20 MG tablet Take 20 mg by mouth daily.           Allergies  Allergen Reactions  . Codeine   . Simvastatin     Leg aches    Past Medical History  Diagnosis Date  . Diabetes  mellitus   . Hyperlipidemia   . Hypertension     Past Surgical History  Procedure Date  . Back surgery     disk  . Appendectomy   . Tonsillectomy   . Total knee arthroplasty     History  Smoking status  . Former Smoker  . Types: Pipe  . Quit date: 11/25/1975  Smokeless tobacco  . Never Used    History  Alcohol Use No    Family History  Problem Relation Age of Onset  . Emphysema Mother   . Heart attack Father     Reviw of Systems:  Reviewed in the HPI.  All other systems are negative.  Physical Exam: BP 142/78  Pulse 37  Ht 6' (1.829 m)  Wt 240 lb (108.863 kg)  BMI 32.55 kg/m2 The patient is alert and oriented x 3.  The mood and affect are normal.  The skin is warm and dry.  Color is normal.  The HEENT exam reveals that the sclera are nonicteric.  The mucous  membranes are moist.  The carotids are 2+ without bruits.  There is no thyromegaly.  There is no JVD.  The lungs are clear.  The chest wall is non tender.  The heart exam reveals a regular rate with a normal S1 and S2.  There are no murmurs, gallops, or rubs.  The PMI is not displaced.   Abdominal exam reveals good bowel sounds.  There is no guarding or rebound.  There is no hepatosplenomegaly or tenderness.  There are no masses.  Exam of the legs reveal no clubbing, cyanosis, or edema.  The legs are without rashes.  The distal pulses are intact.  Cranial nerves II - XII are intact.  Motor and sensory functions are intact.  The gait is normal.  ECG: His EKG reveals atrial fibrillation. He has a slow ventricular response at 37 beats a minute. Assessment / Plan:

## 2010-11-27 NOTE — Progress Notes (Signed)
Pt called with results and a copy was mailed.Alfonso Ramus RN

## 2010-12-23 ENCOUNTER — Encounter: Payer: Medicare Other | Admitting: *Deleted

## 2010-12-30 ENCOUNTER — Ambulatory Visit (INDEPENDENT_AMBULATORY_CARE_PROVIDER_SITE_OTHER): Payer: Medicare Other | Admitting: *Deleted

## 2010-12-30 DIAGNOSIS — I4891 Unspecified atrial fibrillation: Secondary | ICD-10-CM

## 2010-12-30 LAB — POCT INR: INR: 1.4

## 2011-01-13 ENCOUNTER — Ambulatory Visit (INDEPENDENT_AMBULATORY_CARE_PROVIDER_SITE_OTHER): Payer: Medicare Other | Admitting: *Deleted

## 2011-01-13 DIAGNOSIS — I4891 Unspecified atrial fibrillation: Secondary | ICD-10-CM

## 2011-02-10 ENCOUNTER — Ambulatory Visit (INDEPENDENT_AMBULATORY_CARE_PROVIDER_SITE_OTHER): Payer: Medicare Other | Admitting: *Deleted

## 2011-02-10 DIAGNOSIS — I4891 Unspecified atrial fibrillation: Secondary | ICD-10-CM

## 2011-02-10 LAB — POCT INR: INR: 2.3

## 2011-02-12 ENCOUNTER — Telehealth: Payer: Self-pay | Admitting: *Deleted

## 2011-02-12 NOTE — Telephone Encounter (Signed)
Pt had endoscopy today. Pulse rate low, ecg strip received from dr edwards p 41 sr irreg. Pt denies SOB, no dizziness and states its been like this for a long time and i feel fine. Dr Ian Bushman notified and is ok with seeing him in a week on his first avail day in [facility] office. Pt satisfied with app. Alfonso Ramus RN

## 2011-02-16 ENCOUNTER — Telehealth: Payer: Self-pay | Admitting: Cardiovascular Disease

## 2011-02-16 NOTE — Telephone Encounter (Signed)
Pt wants to know if he can come earlier on 0829 or another day this week because he has learned that a friend has died and would like to attend the funeral that day please call

## 2011-02-16 NOTE — Telephone Encounter (Signed)
Unable to switch times and pt declines next week app, will keep current app. Pt was very understanding of schedule.

## 2011-02-18 ENCOUNTER — Encounter: Payer: Self-pay | Admitting: Cardiovascular Disease

## 2011-02-18 ENCOUNTER — Ambulatory Visit (INDEPENDENT_AMBULATORY_CARE_PROVIDER_SITE_OTHER): Payer: Medicare Other | Admitting: Cardiovascular Disease

## 2011-02-18 VITALS — BP 120/76 | HR 64 | Ht 72.0 in | Wt 240.8 lb

## 2011-02-18 DIAGNOSIS — I4891 Unspecified atrial fibrillation: Secondary | ICD-10-CM

## 2011-02-18 NOTE — Assessment & Plan Note (Signed)
He has atrial flutter with a very slow ventricular response. So far, he's been asymptomatic. In [place] problems. Specifically he denies any syncope or presyncope.  At this point I do not think that a pacemaker is indicated. Certainly he is likely to need to need a pacemaker the future but at present he doesn't meet qualifications. I've asked him to call me if he has any  episodes of syncope or presyncope.

## 2011-02-18 NOTE — Progress Notes (Signed)
Leitha Schuller Date of Birth  [birth year] [facility] [address]     [address] [place], [place]  [address] [phone]  Fax  [phone]  History of Present Illness:  77 yo with a hx of Sick sinus syndrome.  He was seen Dr. Randa Evens for an endoscopy. His heart rate was noted the 38. Inocencio has remained fairly asymptomatic. He denies any syncope or presyncope.    He was at a late this past weekend and painted his house so we can. He's not had any limitations.  Current Outpatient Prescriptions on File Prior to Visit  Medication Sig Dispense Refill  . aspirin 81 MG tablet Take 81 mg by mouth daily.        Tery Sanfilippo Calcium (STOOL SOFTENER PO) Take by mouth as needed.        . fenofibrate micronized (LOFIBRA) 200 MG capsule Take 1 capsule by mouth Daily.      . fish oil-omega-3 fatty acids 1000 MG capsule Take 2 g by mouth daily.        [name] glipiZIDE (GLUCOTROL) 10 MG tablet Take 5 mg by mouth daily.       . metFORMIN (GLUCOPHAGE) 1000 MG tablet Take 1,000 mg by mouth 2 (two) times daily with a meal.        . multivitamin (THERAGRAN) per tablet Take 1 tablet by mouth daily.        . ramipril (ALTACE) 10 MG capsule Take 10 mg by mouth 2 (two) times daily.       . sitaGLIPtan (JANUVIA) 100 MG tablet Take 50 mg by mouth daily.       . vitamin B-12 (CYANOCOBALAMIN) 1000 MCG tablet Take 1,000 mcg by mouth daily.        [name] warfarin (COUMADIN) 5 MG tablet TAKE AS DIRECTED  90 tablet  3    Allergies  Allergen Reactions  . Codeine   . Simvastatin     Leg aches    Past Medical History  Diagnosis Date  . Diabetes mellitus   . Hyperlipidemia   . Hypertension   . Sick sinus syndrome     a-Flutter with episodes of bradycardia    Past Surgical History  Procedure Date  . Back surgery     disk  . Appendectomy   . Tonsillectomy   . Total knee arthroplasty     History  Smoking status  . Former Smoker  . Types: Pipe  . Quit date: 11/25/1975    Smokeless tobacco  . Never Used    History  Alcohol Use No    Family History  Problem Relation Age of Onset  . Emphysema Mother   . Heart attack Father     Reviw of Systems:  Reviewed in the HPI.  All other systems are negative.  Physical Exam: BP 120/76  Pulse 64  Ht 6' (1.829 m)  Wt 240 lb 12.8 oz (109.226 kg)  BMI 32.66 kg/m2 The patient is alert and oriented x 3.  The mood and affect are normal.   Skin: warm and dry.  Color is normal.    HEENT:   the sclera are nonicteric.  The mucous membranes are moist.  The carotids are 2+ without bruits.  There is no thyromegaly.  There is no JVD.    Lungs: clear.  The chest wall is non tender.    Heart: regular rate with a normal S1 and S2.  There are no murmurs, gallops,  or rubs. The PMI is not displaced.     Abdomen: good bowel sounds.  There is no guarding or rebound.  There is no hepatosplenomegaly or tenderness.  There are no masses.   Extremities:  no clubbing, cyanosis, or edema.  The legs are without rashes.  The distal pulses are intact.   Neuro:  Cranial nerves II - XII are intact.  Motor and sensory functions are intact.    The gait is normal.  ECG:  Assessment / Plan:

## 2011-02-19 ENCOUNTER — Encounter: Payer: Self-pay | Admitting: Cardiovascular Disease

## 2011-03-10 ENCOUNTER — Ambulatory Visit (INDEPENDENT_AMBULATORY_CARE_PROVIDER_SITE_OTHER): Payer: Medicare Other | Admitting: *Deleted

## 2011-03-10 DIAGNOSIS — I4891 Unspecified atrial fibrillation: Secondary | ICD-10-CM

## 2011-04-07 ENCOUNTER — Ambulatory Visit (INDEPENDENT_AMBULATORY_CARE_PROVIDER_SITE_OTHER): Payer: Medicare Other | Admitting: *Deleted

## 2011-04-07 DIAGNOSIS — I4891 Unspecified atrial fibrillation: Secondary | ICD-10-CM

## 2011-05-19 ENCOUNTER — Ambulatory Visit (INDEPENDENT_AMBULATORY_CARE_PROVIDER_SITE_OTHER): Payer: Medicare Other | Admitting: *Deleted

## 2011-05-19 DIAGNOSIS — I4891 Unspecified atrial fibrillation: Secondary | ICD-10-CM

## 2011-05-19 DIAGNOSIS — I4892 Unspecified atrial flutter: Secondary | ICD-10-CM

## 2011-06-30 ENCOUNTER — Ambulatory Visit (INDEPENDENT_AMBULATORY_CARE_PROVIDER_SITE_OTHER): Payer: Medicare Other | Admitting: *Deleted

## 2011-06-30 DIAGNOSIS — I4891 Unspecified atrial fibrillation: Secondary | ICD-10-CM

## 2011-06-30 LAB — POCT INR: INR: 1.7

## 2011-07-21 DIAGNOSIS — I4892 Unspecified atrial flutter: Secondary | ICD-10-CM | POA: Diagnosis not present

## 2011-07-21 DIAGNOSIS — E785 Hyperlipidemia, unspecified: Secondary | ICD-10-CM | POA: Diagnosis not present

## 2011-07-21 DIAGNOSIS — E538 Deficiency of other specified B group vitamins: Secondary | ICD-10-CM | POA: Diagnosis not present

## 2011-07-21 DIAGNOSIS — I10 Essential (primary) hypertension: Secondary | ICD-10-CM | POA: Diagnosis not present

## 2011-07-21 DIAGNOSIS — E119 Type 2 diabetes mellitus without complications: Secondary | ICD-10-CM | POA: Diagnosis not present

## 2011-07-28 ENCOUNTER — Other Ambulatory Visit: Payer: Self-pay | Admitting: *Deleted

## 2011-07-28 MED ORDER — FENOFIBRATE MICRONIZED 200 MG PO CAPS: 200.0000 mg | ORAL_CAPSULE | Freq: Every day | ORAL | Status: DC

## 2011-07-29 DIAGNOSIS — M25559 Pain in unspecified hip: Secondary | ICD-10-CM | POA: Diagnosis not present

## 2011-07-31 DIAGNOSIS — M25559 Pain in unspecified hip: Secondary | ICD-10-CM | POA: Diagnosis not present

## 2011-08-17 ENCOUNTER — Ambulatory Visit (INDEPENDENT_AMBULATORY_CARE_PROVIDER_SITE_OTHER): Payer: Medicare Other | Admitting: Cardiovascular Disease

## 2011-08-17 ENCOUNTER — Encounter: Payer: Self-pay | Admitting: Cardiovascular Disease

## 2011-08-17 ENCOUNTER — Ambulatory Visit (INDEPENDENT_AMBULATORY_CARE_PROVIDER_SITE_OTHER): Payer: Medicare Other

## 2011-08-17 VITALS — BP 152/74 | HR 43 | Ht 72.0 in | Wt 242.1 lb

## 2011-08-17 DIAGNOSIS — I4891 Unspecified atrial fibrillation: Secondary | ICD-10-CM | POA: Diagnosis not present

## 2011-08-17 DIAGNOSIS — I495 Sick sinus syndrome: Secondary | ICD-10-CM | POA: Diagnosis not present

## 2011-08-17 DIAGNOSIS — I4892 Unspecified atrial flutter: Secondary | ICD-10-CM

## 2011-08-17 MED ORDER — WARFARIN SODIUM 5 MG PO TABS: ORAL_TABLET | ORAL | Status: DC

## 2011-08-17 NOTE — Assessment & Plan Note (Signed)
He has asymptomatic bradycardia for the past 13 years and

## 2011-08-17 NOTE — Progress Notes (Signed)
Oscar Baker Date of Birth  1933/03/15 [facility]     [place] Office  [address]    [address]   [address] [place], [place]  [address]    [place], [place]  [address] [phone]  Fax  [phone]  [phone]  Fax [phone]  Problem list: 1. Sick sinus syndrome with episodes of atrial flutter, frequent episodes of bradycardia 2. Hypertension 3. Dyslipidemia 4. Diabetes mellitus 5. Arthroscopic knee surgery  History of Present Illness:  Oscar Baker is doing well.  He has asymptomatic bradycardia that we have followed since July 2000.  He has never had any syncope.  He had left knee surgery a year ago and right knee surgery in Dec. 2012.  No cardiac complaints.    Current Outpatient Prescriptions on File Prior to Visit  Medication Sig Dispense Refill  . aspirin 81 MG tablet Take 81 mg by mouth daily.        Oscar Baker Calcium (STOOL SOFTENER PO) Take by mouth as needed.        . fenofibrate micronized (LOFIBRA) 200 MG capsule Take 1 capsule (200 mg total) by mouth daily before breakfast.  90 capsule  3  . fish oil-omega-3 fatty acids 1000 MG capsule Take 2 g by mouth daily.        [name] glipiZIDE (GLUCOTROL) 10 MG tablet Take 5 mg by mouth daily.       . metFORMIN (GLUCOPHAGE) 1000 MG tablet Take 1,000 mg by mouth 2 (two) times daily with a meal.        . multivitamin (THERAGRAN) per tablet Take 1 tablet by mouth daily.        . ramipril (ALTACE) 10 MG capsule Take 10 mg by mouth 2 (two) times daily.       . sitaGLIPtan (JANUVIA) 100 MG tablet Take 50 mg by mouth daily.       . vitamin B-12 (CYANOCOBALAMIN) 1000 MCG tablet Take 1,000 mcg by mouth daily.        [name] warfarin (COUMADIN) 5 MG tablet TAKE AS DIRECTED  90 tablet  3    Allergies  Allergen Reactions  . Codeine   . Simvastatin     Leg aches    Past Medical History  Diagnosis Date  . Diabetes mellitus   . Hyperlipidemia   . Hypertension   . Sick sinus syndrome     a-Flutter with episodes of  bradycardia    Past Surgical History  Procedure Date  . Back surgery     disk  . Appendectomy   . Tonsillectomy   . Total knee arthroplasty     History  Smoking status  . Former Smoker  . Types: Pipe  . Quit date: 11/25/1975  Smokeless tobacco  . Never Used    History  Alcohol Use No    Family History  Problem Relation Age of Onset  . Emphysema Mother   . Heart attack Father     Reviw of Systems:  Reviewed in the HPI.  All other systems are negative.  Physical Exam: Blood pressure 152/74, pulse 43, height 6' (1.829 m), weight 242 lb 1.9 oz (109.825 kg). General: Well developed, well nourished, in no acute distress.  Head: Normocephalic, atraumatic, sclera non-icteric, mucus membranes are moist,   Neck: Supple. Carotids are 2 + without bruits. No JVD  Lungs: Clear bilaterally to auscultation.  Heart: regular rate  With normal  S1 S2. No murmurs, gallops or rubs.  Abdomen: Soft,  non-tender, non-distended with normal bowel sounds. No hepatomegaly. No rebound/guarding. No masses.  Msk:  Strength and tone are normal  Extremities: No clubbing or cyanosis. No edema.  Distal pedal pulses are 2+ and equal bilaterally.  Neuro: Alert and oriented X 3. Moves all extremities spontaneously.  Psych:  Responds to questions appropriately with a normal affect.  ECG  Assessment / Plan:

## 2011-08-17 NOTE — Patient Instructions (Signed)
Your physician wants you to follow-up in: 6 months  You will receive a reminder letter in the mail two months in advance. If you don't receive a letter, please call our office to schedule the follow-up appointment.  Your physician recommends that you return for a FASTING lipid profile: 6 months   

## 2011-09-03 DIAGNOSIS — H2510 Age-related nuclear cataract, unspecified eye: Secondary | ICD-10-CM | POA: Diagnosis not present

## 2011-09-03 DIAGNOSIS — E119 Type 2 diabetes mellitus without complications: Secondary | ICD-10-CM | POA: Diagnosis not present

## 2011-09-29 ENCOUNTER — Ambulatory Visit (INDEPENDENT_AMBULATORY_CARE_PROVIDER_SITE_OTHER): Payer: Medicare Other | Admitting: *Deleted

## 2011-09-29 DIAGNOSIS — I4892 Unspecified atrial flutter: Secondary | ICD-10-CM

## 2011-09-29 DIAGNOSIS — I4891 Unspecified atrial fibrillation: Secondary | ICD-10-CM

## 2011-09-29 LAB — POCT INR: INR: 2

## 2011-10-09 ENCOUNTER — Other Ambulatory Visit: Payer: Self-pay

## 2011-10-09 MED ORDER — FENOFIBRATE MICRONIZED 200 MG PO CAPS: 200.0000 mg | ORAL_CAPSULE | Freq: Every day | ORAL | Status: DC

## 2011-10-09 NOTE — Telephone Encounter (Signed)
..   Requested Prescriptions   Signed Prescriptions Disp Refills  . fenofibrate micronized (LOFIBRA) 200 MG capsule 90 capsule 4    Sig: Take 1 capsule (200 mg total) by mouth daily before breakfast.    Authorizing Provider: Vesta Mixer    Ordering User: Lacie Scotts

## 2011-11-03 ENCOUNTER — Ambulatory Visit (INDEPENDENT_AMBULATORY_CARE_PROVIDER_SITE_OTHER): Payer: Medicare Other | Admitting: Pharmacist

## 2011-11-03 DIAGNOSIS — I4891 Unspecified atrial fibrillation: Secondary | ICD-10-CM

## 2011-11-03 DIAGNOSIS — I4892 Unspecified atrial flutter: Secondary | ICD-10-CM

## 2011-11-03 LAB — POCT INR: INR: 3

## 2011-12-08 ENCOUNTER — Ambulatory Visit (INDEPENDENT_AMBULATORY_CARE_PROVIDER_SITE_OTHER): Payer: Medicare Other | Admitting: *Deleted

## 2011-12-08 DIAGNOSIS — I4892 Unspecified atrial flutter: Secondary | ICD-10-CM | POA: Diagnosis not present

## 2011-12-08 DIAGNOSIS — I4891 Unspecified atrial fibrillation: Secondary | ICD-10-CM | POA: Diagnosis not present

## 2012-01-05 DIAGNOSIS — E538 Deficiency of other specified B group vitamins: Secondary | ICD-10-CM | POA: Diagnosis not present

## 2012-01-05 DIAGNOSIS — E785 Hyperlipidemia, unspecified: Secondary | ICD-10-CM | POA: Diagnosis not present

## 2012-01-05 DIAGNOSIS — E119 Type 2 diabetes mellitus without complications: Secondary | ICD-10-CM | POA: Diagnosis not present

## 2012-01-05 DIAGNOSIS — I10 Essential (primary) hypertension: Secondary | ICD-10-CM | POA: Diagnosis not present

## 2012-01-05 DIAGNOSIS — Z125 Encounter for screening for malignant neoplasm of prostate: Secondary | ICD-10-CM | POA: Diagnosis not present

## 2012-01-12 ENCOUNTER — Ambulatory Visit (INDEPENDENT_AMBULATORY_CARE_PROVIDER_SITE_OTHER): Payer: Medicare Other | Admitting: *Deleted

## 2012-01-12 DIAGNOSIS — I4892 Unspecified atrial flutter: Secondary | ICD-10-CM

## 2012-01-12 DIAGNOSIS — Z0000 Encounter for general adult medical examination without abnormal findings: Secondary | ICD-10-CM | POA: Diagnosis not present

## 2012-01-12 DIAGNOSIS — I4891 Unspecified atrial fibrillation: Secondary | ICD-10-CM

## 2012-01-12 DIAGNOSIS — E785 Hyperlipidemia, unspecified: Secondary | ICD-10-CM | POA: Diagnosis not present

## 2012-01-12 DIAGNOSIS — R05 Cough: Secondary | ICD-10-CM | POA: Diagnosis not present

## 2012-01-12 DIAGNOSIS — I10 Essential (primary) hypertension: Secondary | ICD-10-CM | POA: Diagnosis not present

## 2012-01-12 DIAGNOSIS — C61 Malignant neoplasm of prostate: Secondary | ICD-10-CM | POA: Diagnosis not present

## 2012-01-12 DIAGNOSIS — Z1212 Encounter for screening for malignant neoplasm of rectum: Secondary | ICD-10-CM | POA: Diagnosis not present

## 2012-02-16 ENCOUNTER — Ambulatory Visit (INDEPENDENT_AMBULATORY_CARE_PROVIDER_SITE_OTHER): Payer: Medicare Other | Admitting: Pharmacist

## 2012-02-16 DIAGNOSIS — I4892 Unspecified atrial flutter: Secondary | ICD-10-CM

## 2012-02-16 DIAGNOSIS — I4891 Unspecified atrial fibrillation: Secondary | ICD-10-CM

## 2012-02-16 LAB — POCT INR: INR: 3.3

## 2012-03-22 ENCOUNTER — Ambulatory Visit (INDEPENDENT_AMBULATORY_CARE_PROVIDER_SITE_OTHER): Payer: Medicare Other

## 2012-03-22 DIAGNOSIS — I4891 Unspecified atrial fibrillation: Secondary | ICD-10-CM

## 2012-03-22 DIAGNOSIS — I4892 Unspecified atrial flutter: Secondary | ICD-10-CM

## 2012-04-12 ENCOUNTER — Ambulatory Visit (INDEPENDENT_AMBULATORY_CARE_PROVIDER_SITE_OTHER): Payer: Medicare Other | Admitting: Cardiovascular Disease

## 2012-04-12 ENCOUNTER — Encounter: Payer: Self-pay | Admitting: Cardiovascular Disease

## 2012-04-12 VITALS — BP 155/75 | HR 45 | Ht 72.0 in | Wt 245.0 lb

## 2012-04-12 DIAGNOSIS — I4892 Unspecified atrial flutter: Secondary | ICD-10-CM

## 2012-04-12 DIAGNOSIS — I10 Essential (primary) hypertension: Secondary | ICD-10-CM | POA: Insufficient documentation

## 2012-04-12 MED ORDER — AMLODIPINE BESYLATE 5 MG PO TABS: 5.0000 mg | ORAL_TABLET | Freq: Every day | ORAL | Status: DC

## 2012-04-12 NOTE — Assessment & Plan Note (Signed)
His ventricular rate is very slow. He is basically asymptomatic. We'll continue the same medications. He's never had an episode of syncope.

## 2012-04-12 NOTE — Assessment & Plan Note (Addendum)
His BP is elevated today. He thinks that he might of been eating a little bit of extra salt. Given his slow heart rate it would be normal for his systolic blood pressure to be a little bit elevated.  We will add amlodipine 5 mg a day. I've warned him about looking out for any side effects including headache and mild leg edema. He'll call us if he has any of these problems.  We will give him information on the DASH diet.  I seen again in 3 months for followup visit and basic metabolic profile.

## 2012-04-12 NOTE — Patient Instructions (Addendum)
Your physician recommends that you schedule a follow-up appointment in: 3 MONTHS  Your physician has recommended you make the following change in your medication:  START AMLODIPINE 5 MG DAILY  Your physician recommends that you return for lab work in: 3 MONTHS// BMET   REDUCE HIGH SODIUM FOODS LIKE CANNED SOUP, GRAVY, SAUCES, READY PREPARED FOODS LIKE FROZEN FOODS; LEAN CUISINE, LASAGNA. BACON, SAUSAGE, LUNCH MEAT, FAST FOODS.[name]   DASH Diet The DASH diet stands for "Dietary Approaches to Stop Hypertension." It is a healthy eating plan that has been shown to reduce high blood pressure (hypertension) in as little as 14 days, while also possibly providing other significant health benefits. These other health benefits include reducing the risk of breast cancer after menopause and reducing the risk of type 2 diabetes, heart disease, colon cancer, and stroke. Health benefits also include weight loss and slowing kidney failure in patients with chronic kidney disease.  DIET GUIDELINES  Limit salt (sodium). Your diet should contain less than 1500 mg of sodium daily.  Limit refined or processed carbohydrates. Your diet should include mostly whole grains. Desserts and added sugars should be used sparingly.  Include small amounts of heart-healthy fats. These types of fats include nuts, oils, and tub margarine. Limit saturated and trans fats. These fats have been shown to be harmful in the body. CHOOSING FOODS  The following food groups are based on a 2000 calorie diet. See your Registered Dietitian for individual calorie needs. Grains and Grain Products (6 to 8 servings daily)  Eat More Often: Whole-wheat bread, brown rice, whole-grain or wheat pasta, quinoa, popcorn without added fat or salt (air popped).  Eat Less Often: White bread, white pasta, white rice, cornbread. Vegetables (4 to 5 servings daily)  Eat More Often: Fresh, frozen, and canned vegetables. Vegetables may be raw, steamed, roasted, or  grilled with a minimal amount of fat.  Eat Less Often/Avoid: Creamed or fried vegetables. Vegetables in a cheese sauce. Fruit (4 to 5 servings daily)  Eat More Often: All fresh, canned (in natural juice), or frozen fruits. Dried fruits without added sugar. One hundred percent fruit juice ( cup [237 mL] daily).  Eat Less Often: Dried fruits with added sugar. Canned fruit in light or heavy syrup. [employer], Fish, and Poultry (2 servings or less daily. One serving is 3 to 4 oz [85-114 g]).  Eat More Often: Ninety percent or leaner ground beef, tenderloin, sirloin. Round cuts of beef, chicken breast, [place] breast. All fish. Grill, bake, or broil your meat. Nothing should be fried.  Eat Less Often/Avoid: Fatty cuts of meat, [place], or chicken leg, thigh, or wing. Fried cuts of meat or fish. Dairy (2 to 3 servings)  Eat More Often: Low-fat or fat-free milk, low-fat plain or light yogurt, reduced-fat or part-skim cheese.  Eat Less Often/Avoid: Milk (whole, 2%).Whole milk yogurt. Full-fat cheeses. Nuts, Seeds, and Legumes (4 to 5 servings per week)  Eat More Often: All without added salt.  Eat Less Often/Avoid: Salted nuts and seeds, canned beans with added salt. Fats and Sweets (limited)  Eat More Often: Vegetable oils, tub margarines without trans fats, sugar-free gelatin. Mayonnaise and salad dressings.  Eat Less Often/Avoid: Coconut oils, palm oils, butter, stick margarine, cream, half and half, cookies, candy, pie. FOR MORE INFORMATION The Dash Diet Eating Plan: www.dashdiet.org Document Released: 05/28/2011 Document Revised: 08/31/2011 Document Reviewed: 05/28/2011 [facility] Patient Information 2013 [place], [place].

## 2012-04-12 NOTE — Progress Notes (Signed)
Oscar Baker Date of Birth  1932-11-30 [facility]     [place] Office  [address]    [address]   [address] [place], [place]  [address]    [place], [place]  [address] [phone]  Fax  [phone]  [phone]  Fax [phone]  Problem list: 1. Sick sinus syndrome with episodes of atrial flutter, frequent episodes of bradycardia 2. Hypertension 3. Dyslipidemia 4. Diabetes mellitus 5. Arthroscopic knee surgery  History of Present Illness:  Oscar Baker is doing well.  He has asymptomatic bradycardia that we have followed since July 2000.  He has never had any syncope.  He had left knee surgery a year ago and right knee surgery in Dec. 2012.  No cardiac complaints.    He has done well since I last saw him.  He has bad knees and has not been walking much - he tries to avoid salt as much as possible.  He denies any chest pain, shortness breath, or syncope.  Current Outpatient Prescriptions on File Prior to Visit  Medication Sig Dispense Refill  . aspirin 81 MG tablet Take 81 mg by mouth daily.        Tery Sanfilippo Calcium (STOOL SOFTENER PO) Take by mouth as needed.        . fenofibrate micronized (LOFIBRA) 200 MG capsule Take 1 capsule (200 mg total) by mouth daily before breakfast.  90 capsule  4  . fish oil-omega-3 fatty acids 1000 MG capsule Take 2 g by mouth daily.        [name] glipiZIDE (GLUCOTROL) 10 MG tablet Take 5 mg by mouth daily.       . metFORMIN (GLUCOPHAGE) 1000 MG tablet Take 1,000 mg by mouth 2 (two) times daily with a meal.        . multivitamin (THERAGRAN) per tablet Take 1 tablet by mouth daily.        . ramipril (ALTACE) 10 MG capsule Take 10 mg by mouth 2 (two) times daily.       . sitaGLIPtan (JANUVIA) 100 MG tablet Take 50 mg by mouth daily.       . vitamin B-12 (CYANOCOBALAMIN) 1000 MCG tablet Take 1,000 mcg by mouth daily.        [name] warfarin (COUMADIN) 5 MG tablet Take as directed by physician  90 tablet  3    Allergies  Allergen Reactions    . Codeine   . Simvastatin     Leg aches    Past Medical History  Diagnosis Date  . Diabetes mellitus   . Hyperlipidemia   . Hypertension   . Sick sinus syndrome     a-Flutter with episodes of bradycardia    Past Surgical History  Procedure Date  . Back surgery     disk  . Appendectomy   . Tonsillectomy   . Total knee arthroplasty     History  Smoking status  . Former Smoker  . Types: Pipe  . Quit date: 11/25/1975  Smokeless tobacco  . Never Used    History  Alcohol Use No    Family History  Problem Relation Age of Onset  . Emphysema Mother   . Heart attack Father     Reviw of Systems:  Reviewed in the HPI.  All other systems are negative.  Physical Exam: Blood pressure 170/82, pulse 45, height 6' (1.829 m), weight 245 lb (111.131 kg). General: Well developed, well nourished, in no acute distress.  Head: Normocephalic, atraumatic,  sclera non-icteric, mucus membranes are moist,   Neck: Supple. Carotids are 2 + without bruits. No JVD  Lungs: Clear bilaterally to auscultation.  Heart: regular rate  With normal  S1 S2. No murmurs, gallops or rubs.  Abdomen: Soft, non-tender, non-distended with normal bowel sounds. No hepatomegaly. No rebound/guarding. No masses.  Msk:  Strength and tone are normal  Extremities: No clubbing or cyanosis. No edema.  Distal pedal pulses are 2+ and equal bilaterally.  Neuro: Alert and oriented X 3. Moves all extremities spontaneously.  Psych:  Responds to questions appropriately with a normal affect.  ECG 04/12/2012-atrial fibrillation/ atrial flutter with a slow ventricular response.  He has a right bundle branch block.  Assessment / Plan:

## 2012-04-20 ENCOUNTER — Encounter: Payer: Self-pay | Admitting: Cardiovascular Disease

## 2012-04-28 ENCOUNTER — Ambulatory Visit (INDEPENDENT_AMBULATORY_CARE_PROVIDER_SITE_OTHER): Payer: Medicare Other | Admitting: *Deleted

## 2012-04-28 DIAGNOSIS — I4891 Unspecified atrial fibrillation: Secondary | ICD-10-CM | POA: Diagnosis not present

## 2012-04-28 DIAGNOSIS — I4892 Unspecified atrial flutter: Secondary | ICD-10-CM | POA: Diagnosis not present

## 2012-05-10 DIAGNOSIS — L219 Seborrheic dermatitis, unspecified: Secondary | ICD-10-CM | POA: Diagnosis not present

## 2012-05-10 DIAGNOSIS — L919 Hypertrophic disorder of the skin, unspecified: Secondary | ICD-10-CM | POA: Diagnosis not present

## 2012-05-10 DIAGNOSIS — L909 Atrophic disorder of skin, unspecified: Secondary | ICD-10-CM | POA: Diagnosis not present

## 2012-05-10 DIAGNOSIS — L259 Unspecified contact dermatitis, unspecified cause: Secondary | ICD-10-CM | POA: Diagnosis not present

## 2012-06-07 ENCOUNTER — Ambulatory Visit (INDEPENDENT_AMBULATORY_CARE_PROVIDER_SITE_OTHER): Payer: Medicare Other | Admitting: *Deleted

## 2012-06-07 DIAGNOSIS — I4891 Unspecified atrial fibrillation: Secondary | ICD-10-CM | POA: Diagnosis not present

## 2012-06-07 DIAGNOSIS — I4892 Unspecified atrial flutter: Secondary | ICD-10-CM | POA: Diagnosis not present

## 2012-06-07 LAB — POCT INR: INR: 2.7

## 2012-07-13 ENCOUNTER — Ambulatory Visit (INDEPENDENT_AMBULATORY_CARE_PROVIDER_SITE_OTHER): Payer: Medicare Other | Admitting: *Deleted

## 2012-07-13 ENCOUNTER — Ambulatory Visit (INDEPENDENT_AMBULATORY_CARE_PROVIDER_SITE_OTHER): Payer: Medicare Other | Admitting: Cardiovascular Disease

## 2012-07-13 ENCOUNTER — Encounter: Payer: Self-pay | Admitting: Cardiovascular Disease

## 2012-07-13 VITALS — BP 158/86 | HR 51 | Ht 72.0 in | Wt 244.0 lb

## 2012-07-13 DIAGNOSIS — I4892 Unspecified atrial flutter: Secondary | ICD-10-CM | POA: Diagnosis not present

## 2012-07-13 DIAGNOSIS — I495 Sick sinus syndrome: Secondary | ICD-10-CM

## 2012-07-13 DIAGNOSIS — I10 Essential (primary) hypertension: Secondary | ICD-10-CM

## 2012-07-13 DIAGNOSIS — E785 Hyperlipidemia, unspecified: Secondary | ICD-10-CM

## 2012-07-13 DIAGNOSIS — I4891 Unspecified atrial fibrillation: Secondary | ICD-10-CM

## 2012-07-13 LAB — LDL CHOLESTEROL, DIRECT: Direct LDL: 133.8 mg/dL

## 2012-07-13 LAB — BASIC METABOLIC PANEL
BUN: 15 mg/dL (ref 6–23)
Chloride: 105 mEq/L (ref 96–112)
Potassium: 4.3 mEq/L (ref 3.5–5.1)

## 2012-07-13 LAB — LIPID PANEL
Cholesterol: 205 mg/dL — ABNORMAL HIGH (ref 0–200)
Total CHOL/HDL Ratio: 8
Triglycerides: 188 mg/dL — ABNORMAL HIGH (ref 0.0–149.0)
VLDL: 37.6 mg/dL (ref 0.0–40.0)

## 2012-07-13 LAB — HEPATIC FUNCTION PANEL: Albumin: 4 g/dL (ref 3.5–5.2)

## 2012-07-13 MED ORDER — AMLODIPINE BESYLATE 5 MG PO TABS: 5.0000 mg | ORAL_TABLET | Freq: Every day | ORAL | Status: DC

## 2012-07-13 NOTE — Progress Notes (Signed)
Leitha Schuller Date of Birth  Sep 17, 1932 [facility]      Office  [address]    [address]   [address] [place], [place]  [address]    [place], [place]  [address] [phone]  Fax  [phone]  [phone]  Fax [phone]  Problem list: 1. Sick sinus syndrome with episodes of atrial flutter, frequent episodes of bradycardia 2. Hypertension 3. Dyslipidemia 4. Diabetes mellitus 5. Arthroscopic knee surgery  History of Present Illness:  Elliott is doing well.  He has asymptomatic bradycardia that we have followed since July 2000.  He has never had any syncope.  He had left knee surgery a year ago and right knee surgery in Dec. 2012.  No cardiac complaints.    He has done well since I last saw him.  He has bad knees and has not been walking much - he tries to avoid salt as much as possible.  He denies any chest pain, shortness breath, or syncope.  July 13, 2012 Rylend is doing well.  No syncope or presyncope.  [name]  Current Outpatient Prescriptions on File Prior to Visit  Medication Sig Dispense Refill  . amLODipine (NORVASC) 5 MG tablet Take 1 tablet (5 mg total) by mouth daily.  30 tablet  5  . aspirin 81 MG tablet Take 81 mg by mouth daily.        Tery Sanfilippo Calcium (STOOL SOFTENER PO) Take by mouth as needed.        . fenofibrate micronized (LOFIBRA) 200 MG capsule Take 1 capsule (200 mg total) by mouth daily before breakfast.  90 capsule  4  . fish oil-omega-3 fatty acids 1000 MG capsule Take 2 g by mouth daily.        [name] glipiZIDE (GLUCOTROL) 10 MG tablet Take 5 mg by mouth daily.       . metFORMIN (GLUCOPHAGE) 1000 MG tablet Take 1,000 mg by mouth 2 (two) times daily with a meal.        . multivitamin (THERAGRAN) per tablet Take 1 tablet by mouth daily.        . ramipril (ALTACE) 10 MG capsule Take 10 mg by mouth 2 (two) times daily.       . sitaGLIPtan (JANUVIA) 100 MG  tablet Take 50 mg by mouth daily.       . vitamin B-12 (CYANOCOBALAMIN) 1000 MCG tablet Take 1,000 mcg by mouth 2 (two) times daily.       [name] warfarin (COUMADIN) 5 MG tablet Take as directed by physician  90 tablet  3    Allergies  Allergen Reactions  . Codeine   . Simvastatin     Leg aches    Past Medical History  Diagnosis Date  . Diabetes mellitus   . Hyperlipidemia   . Hypertension   . Sick sinus syndrome     a-Flutter with episodes of bradycardia    Past Surgical History  Procedure Date  . Back surgery     disk  . Appendectomy   . Tonsillectomy   . Total knee arthroplasty     History  Smoking status  . Former Smoker  . Types: Pipe  . Quit date: 11/25/1975  Smokeless tobacco  . Never Used    History  Alcohol Use No    Family History  Problem Relation Age of Onset  . Emphysema Mother   .  Heart attack Father     Reviw of Systems:  Reviewed in the HPI.  All other systems are negative.  Physical Exam: Blood pressure 158/86, pulse 51, height 6' (1.829 m), weight 244 lb (110.678 kg), SpO2 95.00%. General: Well developed, well nourished, in no acute distress.  Head: Normocephalic, atraumatic, sclera non-icteric, mucus membranes are moist,   Neck: Supple. Carotids are 2 + without bruits. No JVD  Lungs: Clear bilaterally to auscultation.  Heart: regular rate  With normal  S1 S2. No murmurs, gallops or rubs.  Abdomen: Soft, non-tender, non-distended with normal bowel sounds. No hepatomegaly. No rebound/guarding. No masses.  Msk:  Strength and tone are normal  Extremities: No clubbing or cyanosis. No edema.  Distal pedal pulses are 2+ and equal bilaterally.  Neuro: Alert and oriented X 3. Moves all extremities spontaneously.  Psych:  Responds to questions appropriately with a normal affect.  ECG 04/12/2012-atrial fibrillation/ atrial flutter with a slow ventricular response.  He has a right bundle branch block.  Assessment / Plan:

## 2012-07-13 NOTE — Assessment & Plan Note (Signed)
His blood pressure is mildly elevated today but he does have a home. We will have her measure his blood pressure on a regular basis.

## 2012-07-13 NOTE — Assessment & Plan Note (Signed)
We'll measure fasting lipids today.

## 2012-07-13 NOTE — Patient Instructions (Addendum)
Your physician recommends that you return for a FASTING lipid profile: today and in 6 months   Your physician recommends that you continue on your current medications as directed. Please refer to the Current Medication list given to you today.  Your physician wants you to follow-up in: 6 months  You will receive a reminder letter in the mail two months in advance. If you don't receive a letter, please call our office to schedule the follow-up appointment.

## 2012-07-13 NOTE — Assessment & Plan Note (Signed)
Plan is doing very well. His heart rate slowed he is completely symptomatically. He's not had any episodes of chest pain or shortness of breath. He denies any syncope or presyncope.  We'll continue with his same medications. I seen again in 6 months for office visit, fasting labs and EKG.

## 2012-07-19 ENCOUNTER — Telehealth: Payer: Self-pay | Admitting: Cardiovascular Disease

## 2012-07-19 DIAGNOSIS — E785 Hyperlipidemia, unspecified: Secondary | ICD-10-CM

## 2012-07-19 MED ORDER — ATORVASTATIN CALCIUM 10 MG PO TABS: 10.0000 mg | ORAL_TABLET | Freq: Every day | ORAL | Status: DC

## 2012-07-19 NOTE — Telephone Encounter (Signed)
Pt agreed to start new med, lab date given.

## 2012-07-19 NOTE — Telephone Encounter (Signed)
Message copied by Antony Odea on Tue Jul 19, 2012 12:12 PM ------      Message from: Vesta Mixer      Created: Wed Jul 13, 2012  5:43 PM       His cholesterol is elevated.  He has not tolerated Simvastatin in the past - he may tolerate atorvastatin 10 mg a day.  Would also advise a stricter diet and weight loss program which will help

## 2012-07-19 NOTE — Telephone Encounter (Signed)
Pt rtn call 

## 2012-08-02 DIAGNOSIS — E785 Hyperlipidemia, unspecified: Secondary | ICD-10-CM | POA: Diagnosis not present

## 2012-08-02 DIAGNOSIS — E119 Type 2 diabetes mellitus without complications: Secondary | ICD-10-CM | POA: Diagnosis not present

## 2012-08-02 DIAGNOSIS — L259 Unspecified contact dermatitis, unspecified cause: Secondary | ICD-10-CM | POA: Diagnosis not present

## 2012-08-02 DIAGNOSIS — I10 Essential (primary) hypertension: Secondary | ICD-10-CM | POA: Diagnosis not present

## 2012-08-09 DIAGNOSIS — L259 Unspecified contact dermatitis, unspecified cause: Secondary | ICD-10-CM | POA: Diagnosis not present

## 2012-08-23 ENCOUNTER — Ambulatory Visit (INDEPENDENT_AMBULATORY_CARE_PROVIDER_SITE_OTHER): Payer: Medicare Other

## 2012-08-23 DIAGNOSIS — I4892 Unspecified atrial flutter: Secondary | ICD-10-CM

## 2012-08-23 DIAGNOSIS — I4891 Unspecified atrial fibrillation: Secondary | ICD-10-CM | POA: Diagnosis not present

## 2012-08-24 DIAGNOSIS — H6120 Impacted cerumen, unspecified ear: Secondary | ICD-10-CM | POA: Diagnosis not present

## 2012-08-30 ENCOUNTER — Other Ambulatory Visit: Payer: Self-pay | Admitting: *Deleted

## 2012-08-30 MED ORDER — WARFARIN SODIUM 5 MG PO TABS: ORAL_TABLET | ORAL | Status: DC

## 2012-09-06 ENCOUNTER — Ambulatory Visit (INDEPENDENT_AMBULATORY_CARE_PROVIDER_SITE_OTHER): Payer: Medicare Other | Admitting: *Deleted

## 2012-09-06 DIAGNOSIS — H2510 Age-related nuclear cataract, unspecified eye: Secondary | ICD-10-CM | POA: Diagnosis not present

## 2012-09-06 DIAGNOSIS — E119 Type 2 diabetes mellitus without complications: Secondary | ICD-10-CM | POA: Diagnosis not present

## 2012-09-06 LAB — HEPATIC FUNCTION PANEL
ALT: 22 U/L (ref 0–53)
Albumin: 3.8 g/dL (ref 3.5–5.2)
Alkaline Phosphatase: 35 U/L — ABNORMAL LOW (ref 39–117)
Bilirubin, Direct: 0 mg/dL (ref 0.0–0.3)
Total Protein: 6.7 g/dL (ref 6.0–8.3)

## 2012-09-06 LAB — BASIC METABOLIC PANEL
CO2: 23 mEq/L (ref 19–32)
Calcium: 8.9 mg/dL (ref 8.4–10.5)
Chloride: 105 mEq/L (ref 96–112)
Glucose, Bld: 81 mg/dL (ref 70–99)
Potassium: 3.9 mEq/L (ref 3.5–5.1)
Sodium: 137 mEq/L (ref 135–145)

## 2012-09-06 LAB — LIPID PANEL: Total CHOL/HDL Ratio: 6

## 2012-09-07 ENCOUNTER — Telehealth: Payer: Self-pay | Admitting: *Deleted

## 2012-09-07 NOTE — Telephone Encounter (Signed)
Reviewed lab results with patient. Patient encouraged to add daily exercise to regimen, that cholesterol medicine is working well but patient needs to add exercise in order to increase HDL and lower LDL.

## 2012-09-07 NOTE — Telephone Encounter (Signed)
Message copied by Antony Odea on Wed Sep 07, 2012 12:39 PM ------      Message from: Vesta Mixer      Created: Tue Sep 06, 2012  4:24 PM       Is currently on atorvastatin 10 mg a day. He is LDL is too high. His HDL is too low. He should increase his exercise in an effort to bring up his HDL. ------

## 2012-09-27 ENCOUNTER — Ambulatory Visit (INDEPENDENT_AMBULATORY_CARE_PROVIDER_SITE_OTHER): Payer: Medicare Other | Admitting: *Deleted

## 2012-09-27 DIAGNOSIS — I4891 Unspecified atrial fibrillation: Secondary | ICD-10-CM

## 2012-09-27 DIAGNOSIS — I4892 Unspecified atrial flutter: Secondary | ICD-10-CM

## 2012-09-27 LAB — POCT INR: INR: 2.6

## 2012-10-10 ENCOUNTER — Telehealth: Payer: Self-pay | Admitting: Cardiovascular Disease

## 2012-10-10 MED ORDER — FENOFIBRATE MICRONIZED 200 MG PO CAPS: 200.0000 mg | ORAL_CAPSULE | Freq: Every day | ORAL | Status: DC

## 2012-10-10 NOTE — Telephone Encounter (Signed)
Fax Received. Refill Completed. Deshanta Lady Chowoe (R.M.A)   

## 2012-11-08 ENCOUNTER — Ambulatory Visit (INDEPENDENT_AMBULATORY_CARE_PROVIDER_SITE_OTHER): Payer: Medicare Other | Admitting: *Deleted

## 2012-11-08 DIAGNOSIS — I4892 Unspecified atrial flutter: Secondary | ICD-10-CM | POA: Diagnosis not present

## 2012-11-08 DIAGNOSIS — I4891 Unspecified atrial fibrillation: Secondary | ICD-10-CM

## 2012-12-20 ENCOUNTER — Ambulatory Visit (INDEPENDENT_AMBULATORY_CARE_PROVIDER_SITE_OTHER): Payer: Medicare Other | Admitting: *Deleted

## 2012-12-20 DIAGNOSIS — I4891 Unspecified atrial fibrillation: Secondary | ICD-10-CM | POA: Diagnosis not present

## 2012-12-20 DIAGNOSIS — I4892 Unspecified atrial flutter: Secondary | ICD-10-CM

## 2012-12-20 LAB — POCT INR: INR: 3.1

## 2012-12-30 ENCOUNTER — Encounter: Payer: Self-pay | Admitting: Podiatry

## 2012-12-30 ENCOUNTER — Ambulatory Visit (INDEPENDENT_AMBULATORY_CARE_PROVIDER_SITE_OTHER): Payer: Medicare Other | Admitting: Podiatry

## 2012-12-30 DIAGNOSIS — L84 Corns and callosities: Secondary | ICD-10-CM

## 2012-12-30 DIAGNOSIS — M898X9 Other specified disorders of bone, unspecified site: Secondary | ICD-10-CM | POA: Diagnosis not present

## 2012-12-30 DIAGNOSIS — M79609 Pain in unspecified limb: Secondary | ICD-10-CM | POA: Diagnosis not present

## 2012-12-30 DIAGNOSIS — M779 Enthesopathy, unspecified: Secondary | ICD-10-CM | POA: Insufficient documentation

## 2012-12-30 DIAGNOSIS — M79675 Pain in left toe(s): Secondary | ICD-10-CM

## 2012-12-30 NOTE — Progress Notes (Signed)
Subjective: Been diabetic for 14-15 years. Blood sugar this morning was 77. Complaining of pain on 5th toe distal medial near nail border.   Objective: Varus rotated 5th toe left. Pedal pulses are all palpable.  Thick toe nails x 10. Digital corn at contact surface 4th and 5th with digital pain 5th left.  Assessment: Hammer toe with digital corn at contact surface 5th left. Mycotic nails x 10.  Plan: Debrided digital lesion. Reviewed all available treatment options, trimming or surgical shaving of bone.  Patient wishes to have it corrected. Will see him back when he sets up for his appointment to remove bone spur from 5th digit left foot.

## 2013-01-16 ENCOUNTER — Encounter: Payer: Self-pay | Admitting: Cardiovascular Disease

## 2013-01-16 ENCOUNTER — Ambulatory Visit (INDEPENDENT_AMBULATORY_CARE_PROVIDER_SITE_OTHER): Payer: Medicare Other | Admitting: *Deleted

## 2013-01-16 ENCOUNTER — Ambulatory Visit (INDEPENDENT_AMBULATORY_CARE_PROVIDER_SITE_OTHER): Payer: Medicare Other | Admitting: Cardiovascular Disease

## 2013-01-16 VITALS — BP 142/78 | HR 40 | Ht 72.0 in | Wt 236.0 lb

## 2013-01-16 DIAGNOSIS — I495 Sick sinus syndrome: Secondary | ICD-10-CM | POA: Diagnosis not present

## 2013-01-16 DIAGNOSIS — I4892 Unspecified atrial flutter: Secondary | ICD-10-CM

## 2013-01-16 DIAGNOSIS — I10 Essential (primary) hypertension: Secondary | ICD-10-CM

## 2013-01-16 DIAGNOSIS — I4891 Unspecified atrial fibrillation: Secondary | ICD-10-CM | POA: Diagnosis not present

## 2013-01-16 DIAGNOSIS — E785 Hyperlipidemia, unspecified: Secondary | ICD-10-CM | POA: Diagnosis not present

## 2013-01-16 DIAGNOSIS — Z125 Encounter for screening for malignant neoplasm of prostate: Secondary | ICD-10-CM | POA: Diagnosis not present

## 2013-01-16 DIAGNOSIS — R82998 Other abnormal findings in urine: Secondary | ICD-10-CM | POA: Diagnosis not present

## 2013-01-16 DIAGNOSIS — E119 Type 2 diabetes mellitus without complications: Secondary | ICD-10-CM | POA: Diagnosis not present

## 2013-01-16 DIAGNOSIS — E538 Deficiency of other specified B group vitamins: Secondary | ICD-10-CM | POA: Diagnosis not present

## 2013-01-16 LAB — POCT INR: INR: 2.5

## 2013-01-16 NOTE — Assessment & Plan Note (Addendum)
Oscar Baker remains asymptomatic.  His HR is slow .  We took him on a 1 minute walk.  His initial HR was 53 and remained unchanged during the walk.  His O2 sats remained normal.

## 2013-01-16 NOTE — Progress Notes (Signed)
Oscar Baker Date of Birth  1933/05/27 [facility]     [place] Office  [address]    [address]   [address] [place], [place]  [address]    [place], [place]  [address] [phone]  Fax  [phone]  [phone]  Fax [phone]  Problem list: 1. Sick sinus syndrome with episodes of atrial flutter, frequent episodes of bradycardia 2. Hypertension 3. Dyslipidemia 4. Diabetes mellitus 5. Arthroscopic knee surgery  History of Present Illness:  Oscar Baker is doing well.  He has asymptomatic bradycardia that we have followed since July 2000.  He has never had any syncope.  He had left knee surgery a year ago and right knee surgery in Dec. 2012.  No cardiac complaints.    He has done well since I last saw him.  He has bad knees and has not been walking much - he tries to avoid salt as much as possible.  He denies any chest pain, shortness breath, or syncope.  July 13, 2012 Oscar Baker is doing well.  No syncope or presyncope.  [name]   January 16, 2013:  Oscar Baker is doing well.  His HR continues to be slow.  He is asymptomatic - no syncope or presyncope.   Current Outpatient Prescriptions on File Prior to Visit  Medication Sig Dispense Refill  . amLODipine (NORVASC) 5 MG tablet Take 1 tablet (5 mg total) by mouth daily.  90 tablet  3  . aspirin 81 MG tablet Take 81 mg by mouth daily.        [name] atorvastatin (LIPITOR) 10 MG tablet Take 1 tablet (10 mg total) by mouth daily.  90 tablet  3  . Docusate Calcium (STOOL SOFTENER PO) Take by mouth as needed.        . fenofibrate micronized (LOFIBRA) 200 MG capsule Take 1 capsule (200 mg total) by mouth daily before breakfast.  90 capsule  3  . fish oil-omega-3 fatty acids 1000 MG capsule Take 2 g by mouth daily.        . fluticasone (FLONASE) 50 MCG/ACT nasal spray Place 1 spray into the nose as needed.       [name] glipiZIDE (GLUCOTROL) 10 MG tablet Take 5 mg by  mouth daily.       . metFORMIN (GLUCOPHAGE) 1000 MG tablet Take 1,000 mg by mouth 2 (two) times daily with a meal.        . multivitamin (THERAGRAN) per tablet Take 1 tablet by mouth daily.        . ramipril (ALTACE) 10 MG capsule Take 10 mg by mouth 2 (two) times daily.       . sitaGLIPtan (JANUVIA) 100 MG tablet Take 50 mg by mouth daily.       . vitamin B-12 (CYANOCOBALAMIN) 1000 MCG tablet Take 1,000 mcg by mouth 2 (two) times daily.       [name] warfarin (COUMADIN) 5 MG tablet Take as directed by coumadin clinic  90 tablet  1   No current facility-administered medications on file prior to visit.    Allergies  Allergen Reactions  . Codeine   . Simvastatin     Leg aches    Past Medical History  Diagnosis Date  . Diabetes mellitus   . Hyperlipidemia   . Hypertension   . Sick sinus syndrome     a-Flutter with episodes of bradycardia    Past  Surgical History  Procedure Laterality Date  . Back surgery      disk  . Appendectomy    . Tonsillectomy    . Total knee arthroplasty      History  Smoking status  . Former Smoker  . Types: Pipe  . Quit date: 11/25/1975  Smokeless tobacco  . Never Used    History  Alcohol Use No    Family History  Problem Relation Age of Onset  . Emphysema Mother   . Heart attack Father     Reviw of Systems:  Reviewed in the HPI.  All other systems are negative.  Physical Exam: Blood pressure 142/78, pulse 40, height 6' (1.829 m), weight 236 lb (107.049 kg). General: Well developed, well nourished, in no acute distress.  Head: Normocephalic, atraumatic, sclera non-icteric, mucus membranes are moist,   Neck: Supple. Carotids are 2 + without bruits. No JVD  Lungs: Clear bilaterally to auscultation.  Heart: regular rate  With normal  S1 S2. No murmurs, gallops or rubs.  Abdomen: Soft, non-tender, non-distended with normal bowel sounds. No hepatomegaly. No rebound/guarding. No masses.  Msk:  Strength and tone are  normal  Extremities: No clubbing or cyanosis. No edema.  Distal pedal pulses are 2+ and equal bilaterally.  Neuro: Alert and oriented X 3. Moves all extremities spontaneously.  Psych:  Responds to questions appropriately with a normal affect.  ECG  January 16, 2013:  Atrial flutter,  RBBB, slow ventricular response.    Assessment / Plan:

## 2013-01-16 NOTE — Assessment & Plan Note (Signed)
His BP remains well controlled.  Continue current meds.

## 2013-01-16 NOTE — Patient Instructions (Addendum)
Your physician wants you to follow-up in: 6 months  You will receive a reminder letter in the mail two months in advance. If you don't receive a letter, please call our office to schedule the follow-up appointment.  Your physician recommends that you continue on your current medications as directed. Please refer to the Current Medication list given to you today.  

## 2013-01-17 ENCOUNTER — Encounter: Payer: Self-pay | Admitting: Podiatry

## 2013-01-17 ENCOUNTER — Ambulatory Visit (INDEPENDENT_AMBULATORY_CARE_PROVIDER_SITE_OTHER): Payer: Medicare Other | Admitting: Podiatry

## 2013-01-17 VITALS — BP 138/67 | HR 43 | Ht 72.0 in | Wt 236.0 lb

## 2013-01-17 DIAGNOSIS — M779 Enthesopathy, unspecified: Secondary | ICD-10-CM

## 2013-01-17 DIAGNOSIS — M79609 Pain in unspecified limb: Secondary | ICD-10-CM | POA: Diagnosis not present

## 2013-01-17 DIAGNOSIS — M898X9 Other specified disorders of bone, unspecified site: Secondary | ICD-10-CM

## 2013-01-17 DIAGNOSIS — M79675 Pain in left toe(s): Secondary | ICD-10-CM

## 2013-01-24 ENCOUNTER — Ambulatory Visit (INDEPENDENT_AMBULATORY_CARE_PROVIDER_SITE_OTHER): Payer: Medicare Other | Admitting: Podiatry

## 2013-01-24 DIAGNOSIS — R05 Cough: Secondary | ICD-10-CM | POA: Diagnosis not present

## 2013-01-24 DIAGNOSIS — I4892 Unspecified atrial flutter: Secondary | ICD-10-CM | POA: Diagnosis not present

## 2013-01-24 DIAGNOSIS — M7150 Other bursitis, not elsewhere classified, unspecified site: Secondary | ICD-10-CM | POA: Diagnosis not present

## 2013-01-24 DIAGNOSIS — M898X9 Other specified disorders of bone, unspecified site: Secondary | ICD-10-CM

## 2013-01-24 DIAGNOSIS — Z1331 Encounter for screening for depression: Secondary | ICD-10-CM | POA: Diagnosis not present

## 2013-01-24 DIAGNOSIS — Z0000 Encounter for general adult medical examination without abnormal findings: Secondary | ICD-10-CM | POA: Diagnosis not present

## 2013-01-24 DIAGNOSIS — D72820 Lymphocytosis (symptomatic): Secondary | ICD-10-CM | POA: Diagnosis not present

## 2013-01-24 DIAGNOSIS — L219 Seborrheic dermatitis, unspecified: Secondary | ICD-10-CM | POA: Diagnosis not present

## 2013-01-24 DIAGNOSIS — J309 Allergic rhinitis, unspecified: Secondary | ICD-10-CM | POA: Diagnosis not present

## 2013-01-24 DIAGNOSIS — R1013 Epigastric pain: Secondary | ICD-10-CM | POA: Diagnosis not present

## 2013-01-24 DIAGNOSIS — M779 Enthesopathy, unspecified: Secondary | ICD-10-CM

## 2013-01-25 ENCOUNTER — Other Ambulatory Visit: Payer: Self-pay

## 2013-01-26 DIAGNOSIS — Z1212 Encounter for screening for malignant neoplasm of rectum: Secondary | ICD-10-CM | POA: Diagnosis not present

## 2013-01-31 NOTE — Progress Notes (Signed)
Patient came in to have bone spur removed from 5th digit left.  On previous visit office procedure was discussed and explained. Patient is ready to have the procedure done at the office.  Treatment: Office procedure done: Excision bone spur from 5th distal phalanx left foot.  Left great toe was anesthetized with 3ml of 1% Xylocaine with Epinephrine. Left foot was cleansed with Betadine solution and draped with sterile drape. 1cm long stab incision was placed with #15 blade at the distal medial end of the 5th digit. Frier elevator was used and detatched soft tissue from the bone spur. Small power bur was introduced into the area and rasped bone spur till the area becomes flat without any protruding bone.  Area was flushed well and closed with 5/0 Nylon suture. Compression bandage applied. Home instruction also given. Will change dressing next week and remove suture. Patient tolerated office procedure well.

## 2013-01-31 NOTE — Progress Notes (Signed)
5 days post op. Patient has no complaints. He the wound covered with band aid. Wound was moist with mild erythema surround the suture site. Sutures removed. Incision site is well coapted. Betadine cleansing done and covered with Selofix tape. Home care instruction given to the patient and to his wife to keep it clean and dry.  Wear open toed shoes till the area gets dry and pain free.  Return as needed.

## 2013-02-15 DIAGNOSIS — E119 Type 2 diabetes mellitus without complications: Secondary | ICD-10-CM | POA: Diagnosis not present

## 2013-02-15 DIAGNOSIS — C61 Malignant neoplasm of prostate: Secondary | ICD-10-CM | POA: Insufficient documentation

## 2013-02-28 ENCOUNTER — Ambulatory Visit (INDEPENDENT_AMBULATORY_CARE_PROVIDER_SITE_OTHER): Payer: Medicare Other | Admitting: Pharmacist

## 2013-02-28 DIAGNOSIS — I4892 Unspecified atrial flutter: Secondary | ICD-10-CM | POA: Diagnosis not present

## 2013-02-28 DIAGNOSIS — I4891 Unspecified atrial fibrillation: Secondary | ICD-10-CM | POA: Diagnosis not present

## 2013-03-06 ENCOUNTER — Ambulatory Visit (INDEPENDENT_AMBULATORY_CARE_PROVIDER_SITE_OTHER): Payer: Medicare Other | Admitting: Podiatry

## 2013-03-06 ENCOUNTER — Encounter: Payer: Self-pay | Admitting: Podiatry

## 2013-03-06 VITALS — BP 72/38 | HR 41 | Wt 237.0 lb

## 2013-03-06 DIAGNOSIS — S9031XA Contusion of right foot, initial encounter: Secondary | ICD-10-CM | POA: Insufficient documentation

## 2013-03-06 DIAGNOSIS — S9030XA Contusion of unspecified foot, initial encounter: Secondary | ICD-10-CM | POA: Insufficient documentation

## 2013-03-06 DIAGNOSIS — M79609 Pain in unspecified limb: Secondary | ICD-10-CM

## 2013-03-06 DIAGNOSIS — R609 Edema, unspecified: Secondary | ICD-10-CM

## 2013-03-06 DIAGNOSIS — M79675 Pain in left toe(s): Secondary | ICD-10-CM

## 2013-03-06 DIAGNOSIS — R600 Localized edema: Secondary | ICD-10-CM | POA: Insufficient documentation

## 2013-03-06 NOTE — Progress Notes (Signed)
Swollen foot and toes are turning black last week. Blackness has reduced in toes now. He noted ankles are black and blue like he has injured his ankle. He does not recall having any incident and has no pain.  Objective: Swollen ankle and foot right with positive sign of decreasing edema showing wrinkles. Red discolored dorsolateral aspect, lateral 1/2 of right foot. Fading ecchymosis on right 1st - 3rd toes with mild edema. No visible open skin or penetration mark.  Assessment: Cellular reaction from insect bite and edema is decreasing.  Plan: Reviewed findings.

## 2013-03-06 NOTE — Patient Instructions (Addendum)
Seen for swollen and red area on right foot. Possible due to insect bite. There is sign of decreasing redness and swelling. May use Hydrocortisone cream as needed for itching. Return as needed.

## 2013-03-09 DIAGNOSIS — R609 Edema, unspecified: Secondary | ICD-10-CM | POA: Diagnosis not present

## 2013-03-09 DIAGNOSIS — R319 Hematuria, unspecified: Secondary | ICD-10-CM | POA: Diagnosis not present

## 2013-03-09 DIAGNOSIS — Z6833 Body mass index (BMI) 33.0-33.9, adult: Secondary | ICD-10-CM | POA: Diagnosis not present

## 2013-03-09 DIAGNOSIS — C61 Malignant neoplasm of prostate: Secondary | ICD-10-CM | POA: Diagnosis not present

## 2013-03-09 DIAGNOSIS — Z7901 Long term (current) use of anticoagulants: Secondary | ICD-10-CM | POA: Diagnosis not present

## 2013-03-09 DIAGNOSIS — N3946 Mixed incontinence: Secondary | ICD-10-CM | POA: Insufficient documentation

## 2013-03-09 DIAGNOSIS — L0291 Cutaneous abscess, unspecified: Secondary | ICD-10-CM | POA: Diagnosis not present

## 2013-03-09 DIAGNOSIS — C679 Malignant neoplasm of bladder, unspecified: Secondary | ICD-10-CM | POA: Diagnosis not present

## 2013-03-10 ENCOUNTER — Encounter (INDEPENDENT_AMBULATORY_CARE_PROVIDER_SITE_OTHER): Payer: Medicare Other | Admitting: *Deleted

## 2013-03-10 DIAGNOSIS — R609 Edema, unspecified: Secondary | ICD-10-CM

## 2013-03-13 ENCOUNTER — Encounter: Payer: Self-pay | Admitting: Internal Medicine

## 2013-03-14 DIAGNOSIS — H6120 Impacted cerumen, unspecified ear: Secondary | ICD-10-CM | POA: Diagnosis not present

## 2013-03-15 ENCOUNTER — Ambulatory Visit (INDEPENDENT_AMBULATORY_CARE_PROVIDER_SITE_OTHER): Payer: Medicare Other | Admitting: Pharmacist

## 2013-03-15 DIAGNOSIS — I4892 Unspecified atrial flutter: Secondary | ICD-10-CM | POA: Diagnosis not present

## 2013-03-15 DIAGNOSIS — I4891 Unspecified atrial fibrillation: Secondary | ICD-10-CM

## 2013-03-21 ENCOUNTER — Other Ambulatory Visit: Payer: Self-pay | Admitting: *Deleted

## 2013-03-21 MED ORDER — WARFARIN SODIUM 5 MG PO TABS: ORAL_TABLET | ORAL | Status: DC

## 2013-03-23 ENCOUNTER — Other Ambulatory Visit: Payer: Self-pay | Admitting: *Deleted

## 2013-03-23 MED ORDER — WARFARIN SODIUM 5 MG PO TABS: ORAL_TABLET | ORAL | Status: DC

## 2013-04-25 ENCOUNTER — Other Ambulatory Visit: Payer: Self-pay

## 2013-04-25 ENCOUNTER — Ambulatory Visit (INDEPENDENT_AMBULATORY_CARE_PROVIDER_SITE_OTHER): Payer: Medicare Other | Admitting: *Deleted

## 2013-04-25 DIAGNOSIS — I4891 Unspecified atrial fibrillation: Secondary | ICD-10-CM

## 2013-04-25 DIAGNOSIS — I4892 Unspecified atrial flutter: Secondary | ICD-10-CM | POA: Diagnosis not present

## 2013-04-25 MED ORDER — ATORVASTATIN CALCIUM 10 MG PO TABS: 10.0000 mg | ORAL_TABLET | Freq: Every day | ORAL | Status: DC

## 2013-04-25 MED ORDER — AMLODIPINE BESYLATE 5 MG PO TABS: 5.0000 mg | ORAL_TABLET | Freq: Every day | ORAL | Status: DC

## 2013-04-25 MED ORDER — FENOFIBRATE MICRONIZED 200 MG PO CAPS: 200.0000 mg | ORAL_CAPSULE | Freq: Every day | ORAL | Status: DC

## 2013-04-27 ENCOUNTER — Other Ambulatory Visit: Payer: Self-pay

## 2013-06-06 ENCOUNTER — Ambulatory Visit (INDEPENDENT_AMBULATORY_CARE_PROVIDER_SITE_OTHER): Payer: Medicare Other | Admitting: General Practice

## 2013-06-06 DIAGNOSIS — I4891 Unspecified atrial fibrillation: Secondary | ICD-10-CM | POA: Diagnosis not present

## 2013-06-06 DIAGNOSIS — I4892 Unspecified atrial flutter: Secondary | ICD-10-CM

## 2013-06-06 LAB — POCT INR: INR: 2.8

## 2013-06-28 ENCOUNTER — Ambulatory Visit (INDEPENDENT_AMBULATORY_CARE_PROVIDER_SITE_OTHER): Payer: Medicare Other | Admitting: Cardiovascular Disease

## 2013-06-28 DIAGNOSIS — Z7901 Long term (current) use of anticoagulants: Secondary | ICD-10-CM | POA: Diagnosis not present

## 2013-06-28 DIAGNOSIS — I4892 Unspecified atrial flutter: Secondary | ICD-10-CM | POA: Diagnosis not present

## 2013-06-28 DIAGNOSIS — R05 Cough: Secondary | ICD-10-CM | POA: Diagnosis not present

## 2013-06-28 DIAGNOSIS — J40 Bronchitis, not specified as acute or chronic: Secondary | ICD-10-CM | POA: Diagnosis not present

## 2013-06-28 DIAGNOSIS — R059 Cough, unspecified: Secondary | ICD-10-CM | POA: Diagnosis not present

## 2013-06-28 DIAGNOSIS — Z6833 Body mass index (BMI) 33.0-33.9, adult: Secondary | ICD-10-CM | POA: Diagnosis not present

## 2013-06-28 LAB — POCT INR: INR: 3.6

## 2013-07-11 ENCOUNTER — Ambulatory Visit (INDEPENDENT_AMBULATORY_CARE_PROVIDER_SITE_OTHER): Payer: Medicare Other | Admitting: *Deleted

## 2013-07-11 DIAGNOSIS — I4892 Unspecified atrial flutter: Secondary | ICD-10-CM | POA: Diagnosis not present

## 2013-07-11 DIAGNOSIS — I4891 Unspecified atrial fibrillation: Secondary | ICD-10-CM

## 2013-07-11 LAB — POCT INR: INR: 3.2

## 2013-07-18 ENCOUNTER — Telehealth: Payer: Self-pay | Admitting: *Deleted

## 2013-07-18 DIAGNOSIS — J309 Allergic rhinitis, unspecified: Secondary | ICD-10-CM | POA: Diagnosis not present

## 2013-07-18 DIAGNOSIS — E669 Obesity, unspecified: Secondary | ICD-10-CM | POA: Diagnosis not present

## 2013-07-18 DIAGNOSIS — E119 Type 2 diabetes mellitus without complications: Secondary | ICD-10-CM | POA: Diagnosis not present

## 2013-07-18 DIAGNOSIS — Z7901 Long term (current) use of anticoagulants: Secondary | ICD-10-CM | POA: Diagnosis not present

## 2013-07-18 DIAGNOSIS — R059 Cough, unspecified: Secondary | ICD-10-CM | POA: Diagnosis not present

## 2013-07-18 DIAGNOSIS — E785 Hyperlipidemia, unspecified: Secondary | ICD-10-CM | POA: Diagnosis not present

## 2013-07-18 DIAGNOSIS — R05 Cough: Secondary | ICD-10-CM | POA: Diagnosis not present

## 2013-07-18 DIAGNOSIS — I10 Essential (primary) hypertension: Secondary | ICD-10-CM | POA: Diagnosis not present

## 2013-07-18 DIAGNOSIS — C61 Malignant neoplasm of prostate: Secondary | ICD-10-CM | POA: Diagnosis not present

## 2013-07-18 NOTE — Telephone Encounter (Signed)
Patient had called [employer] to change tier level for fenofibrate to lower tier due to cost, it was approved to move from tier 2 to tier 1, [employer] rep states she will call patient.

## 2013-07-25 ENCOUNTER — Ambulatory Visit (INDEPENDENT_AMBULATORY_CARE_PROVIDER_SITE_OTHER): Payer: Medicare Other

## 2013-07-25 DIAGNOSIS — I4891 Unspecified atrial fibrillation: Secondary | ICD-10-CM

## 2013-07-25 DIAGNOSIS — I4892 Unspecified atrial flutter: Secondary | ICD-10-CM | POA: Diagnosis not present

## 2013-07-25 LAB — POCT INR: INR: 3

## 2013-08-15 ENCOUNTER — Ambulatory Visit (INDEPENDENT_AMBULATORY_CARE_PROVIDER_SITE_OTHER): Payer: Medicare Other | Admitting: Cardiovascular Disease

## 2013-08-15 ENCOUNTER — Ambulatory Visit (INDEPENDENT_AMBULATORY_CARE_PROVIDER_SITE_OTHER): Payer: Medicare Other | Admitting: Pharmacist

## 2013-08-15 ENCOUNTER — Encounter: Payer: Self-pay | Admitting: Cardiovascular Disease

## 2013-08-15 VITALS — BP 142/70 | HR 42 | Ht 70.0 in | Wt 233.5 lb

## 2013-08-15 DIAGNOSIS — I4892 Unspecified atrial flutter: Secondary | ICD-10-CM

## 2013-08-15 DIAGNOSIS — I4891 Unspecified atrial fibrillation: Secondary | ICD-10-CM | POA: Diagnosis not present

## 2013-08-15 DIAGNOSIS — E785 Hyperlipidemia, unspecified: Secondary | ICD-10-CM | POA: Diagnosis not present

## 2013-08-15 DIAGNOSIS — I10 Essential (primary) hypertension: Secondary | ICD-10-CM | POA: Diagnosis not present

## 2013-08-15 LAB — POCT INR: INR: 2.9

## 2013-08-15 NOTE — Progress Notes (Signed)
Oscar Baker Date of Birth  1933/05/10 [place] [address]    [place]   [place], [place]  [address]    [place], [place]  [address] [phone]  Fax  [phone]  [phone]  Fax [phone]  Problem list: 1. Sick sinus syndrome with episodes of atrial flutter, frequent episodes of bradycardia 2. Hypertension 3. Dyslipidemia 4. Diabetes mellitus 5. Arthroscopic knee surgery  History of Present Illness:  Oscar Baker is doing well.  He has asymptomatic bradycardia that we have followed since July 2000.  He has never had any syncope.  He had left knee surgery a year ago and right knee surgery in Dec. 2012.  No cardiac complaints.    He has done well since I last saw him.  He has bad knees and has not been walking much - he tries to avoid salt as much as possible.  He denies any chest pain, shortness breath, or syncope.  July 13, 2012 Oscar Baker is doing well.  No syncope or presyncope.  [name]   January 16, 2013:  Oscar Baker is doing well.  His HR continues to be slow.  He is asymptomatic - no syncope or presyncope.  Feb. 24, 2015:  Oscar Baker is doing well.  No syncope. No pre-syncope.  Getting his coumadin checked monthly.  No cp or dyspnea.  Had a URI this winter. We walked around the office and he increased his HR to 63.  He is able to get his heart rate up into the 70s when he is working out at [employer].    Current Outpatient Prescriptions on File Prior to Visit  Medication Sig Dispense Refill  . amLODipine (NORVASC) 5 MG tablet Take 1 tablet (5 mg total) by mouth daily.  90 tablet  3  . aspirin 81 MG tablet Take 81 mg by mouth daily.        [name] Calcium (STOOL SOFTENER PO) Take by mouth as needed.        . fenofibrate micronized (LOFIBRA) 200 MG capsule Take 1 capsule (200 mg total) by mouth daily before breakfast.  90 capsule  3  . fish oil-omega-3  fatty acids 1000 MG capsule Take 2 g by mouth daily.        . fluticasone (FLONASE) 50 MCG/ACT nasal spray Place 1 spray into the nose as needed.       [name] glipiZIDE (GLUCOTROL) 10 MG tablet Take 5 mg by mouth daily.       . metFORMIN (GLUCOPHAGE) 1000 MG tablet Take 1,000 mg by mouth 2 (two) times daily with a meal.        . multivitamin (THERAGRAN) per tablet Take 1 tablet by mouth daily.        . ramipril (ALTACE) 10 MG capsule Take 10 mg by mouth 2 (two) times daily.       . sitaGLIPtan (JANUVIA) 100 MG tablet Take 50 mg by mouth daily.       . vitamin B-12 (CYANOCOBALAMIN) 1000 MCG tablet Take 1,000 mcg by mouth 2 (two) times daily.       [name] warfarin (COUMADIN) 5 MG tablet Take as directed by coumadin clinic  90 tablet  1   No current facility-administered medications on file prior to visit.    Allergies  Allergen Reactions  . Codeine   . Simvastatin  Leg aches    Past Medical History  Diagnosis Date  . Diabetes mellitus   . Hyperlipidemia   . Hypertension   . Sick sinus syndrome     a-Flutter with episodes of bradycardia    Past Surgical History  Procedure Laterality Date  . Back surgery      disk  . Appendectomy    . Tonsillectomy    . Total knee arthroplasty      History  Smoking status  . Former Smoker  . Types: Pipe  . Quit date: 11/25/1975  Smokeless tobacco  . Never Used    History  Alcohol Use No    Family History  Problem Relation Age of Onset  . Emphysema Mother   . Heart attack Father     Reviw of Systems:  Reviewed in the HPI.  All other systems are negative.  Physical Exam: Blood pressure 142/70, pulse 42, height 5\' 10"  (1.778 m), weight 233 lb 8 oz (105.915 kg). General: Well developed, well nourished, in no acute distress.  Head: Normocephalic, atraumatic, sclera non-icteric, mucus membranes are moist,   Neck: Supple. Carotids are 2 + without bruits. No JVD  Lungs: Clear bilaterally to auscultation.  Heart: regular rate  With  normal  S1 S2. No murmurs, gallops or rubs.  Abdomen: Soft, non-tender, non-distended with normal bowel sounds. No hepatomegaly. No rebound/guarding. No masses.  Msk:  Strength and tone are normal  Extremities: No clubbing or cyanosis. Trace bilateral edema.  Distal pedal pulses are 2+ and equal bilaterally.  Neuro: Alert and oriented X 3. Moves all extremities spontaneously.  Psych:  Responds to questions appropriately with a normal affect.  ECG  Feb. 24, 2015.  Atrial fib with a slow V response of 42.  His HR increased to 63 while walking around the office.   Assessment / Plan:

## 2013-08-15 NOTE — Patient Instructions (Signed)
Your physician wants you to follow-up in: 6 months  You will receive a reminder letter in the mail two months in advance. If you don't receive a letter, please call our office to schedule the follow-up appointment.  Your physician recommends that you continue on your current medications as directed. Please refer to the Current Medication list given to you today.  

## 2013-08-15 NOTE — Assessment & Plan Note (Signed)
He remains in atrial fibrillation. His heart rate is low. He is asymptomatic. His heart rate increased fairly well with ambulation around office. At this point he does not need pacemaker. I'll see him again in 3 months for followup visit.

## 2013-08-29 DIAGNOSIS — H6120 Impacted cerumen, unspecified ear: Secondary | ICD-10-CM | POA: Diagnosis not present

## 2013-09-11 DIAGNOSIS — H47329 Drusen of optic disc, unspecified eye: Secondary | ICD-10-CM | POA: Diagnosis not present

## 2013-09-11 DIAGNOSIS — E119 Type 2 diabetes mellitus without complications: Secondary | ICD-10-CM | POA: Diagnosis not present

## 2013-09-11 DIAGNOSIS — H2510 Age-related nuclear cataract, unspecified eye: Secondary | ICD-10-CM | POA: Diagnosis not present

## 2013-09-11 DIAGNOSIS — H52209 Unspecified astigmatism, unspecified eye: Secondary | ICD-10-CM | POA: Diagnosis not present

## 2013-09-12 DIAGNOSIS — J40 Bronchitis, not specified as acute or chronic: Secondary | ICD-10-CM | POA: Diagnosis not present

## 2013-09-18 ENCOUNTER — Telehealth: Payer: Self-pay | Admitting: *Deleted

## 2013-09-18 MED ORDER — WARFARIN SODIUM 5 MG PO TABS: ORAL_TABLET | ORAL | Status: DC

## 2013-09-18 NOTE — Telephone Encounter (Signed)
Patient requests coumadin refill be sent to [facility]. Thanks, MI

## 2013-09-26 ENCOUNTER — Ambulatory Visit (INDEPENDENT_AMBULATORY_CARE_PROVIDER_SITE_OTHER): Payer: Medicare Other | Admitting: Pharmacist

## 2013-09-26 DIAGNOSIS — I4892 Unspecified atrial flutter: Secondary | ICD-10-CM | POA: Diagnosis not present

## 2013-09-26 DIAGNOSIS — I4891 Unspecified atrial fibrillation: Secondary | ICD-10-CM

## 2013-09-26 LAB — POCT INR: INR: 2.6

## 2013-11-07 ENCOUNTER — Ambulatory Visit (INDEPENDENT_AMBULATORY_CARE_PROVIDER_SITE_OTHER): Payer: Medicare Other | Admitting: Pharmacist

## 2013-11-07 DIAGNOSIS — I4891 Unspecified atrial fibrillation: Secondary | ICD-10-CM

## 2013-11-07 DIAGNOSIS — I4892 Unspecified atrial flutter: Secondary | ICD-10-CM

## 2013-11-07 LAB — POCT INR: INR: 2.6

## 2013-12-04 ENCOUNTER — Telehealth: Payer: Self-pay | Admitting: Cardiovascular Disease

## 2013-12-04 NOTE — Telephone Encounter (Signed)
Spoke with patient who states he does not have any energy over the last couple of weeks; states he and Dr. Acie Fredrickson have been working on "low pulse" x 10 years.  Patient states he would like to be seen soon, states next appointment is in August with Dr. Acie Fredrickson.  Patient denies chest pain, SOB, dizziness, light headedness or syncope.  I advised patient that Dr. Acie Fredrickson is out of the office this week.  Patient offered that he has Coumadin check on June 30 and asked if he could be seen on this day.  I scheduled patient for June 30 and advised that we will work him in that morning after his appointment in [facility].  I advised patient to call back with worsening symptoms prior to this date.  Patient verbalized understanding and agreement and thanked me for the prompt call.

## 2013-12-04 NOTE — Telephone Encounter (Signed)
New Message:  Pt states he has not had much energy and his heart rate has been in the 40s. Pt is requesting to see Dr. Acie Fredrickson soon. Pt wants to be worked in. Pt also wants to speak with a nurse

## 2013-12-19 ENCOUNTER — Ambulatory Visit (INDEPENDENT_AMBULATORY_CARE_PROVIDER_SITE_OTHER): Payer: Medicare Other | Admitting: Cardiovascular Disease

## 2013-12-19 ENCOUNTER — Ambulatory Visit (INDEPENDENT_AMBULATORY_CARE_PROVIDER_SITE_OTHER): Payer: Medicare Other | Admitting: *Deleted

## 2013-12-19 ENCOUNTER — Encounter: Payer: Self-pay | Admitting: Cardiovascular Disease

## 2013-12-19 VITALS — BP 120/64 | HR 46 | Ht 70.0 in | Wt 236.0 lb

## 2013-12-19 DIAGNOSIS — R5381 Other malaise: Secondary | ICD-10-CM | POA: Diagnosis not present

## 2013-12-19 DIAGNOSIS — R5382 Chronic fatigue, unspecified: Secondary | ICD-10-CM

## 2013-12-19 DIAGNOSIS — R001 Bradycardia, unspecified: Secondary | ICD-10-CM

## 2013-12-19 DIAGNOSIS — R5383 Other fatigue: Secondary | ICD-10-CM | POA: Diagnosis not present

## 2013-12-19 DIAGNOSIS — I498 Other specified cardiac arrhythmias: Secondary | ICD-10-CM

## 2013-12-19 DIAGNOSIS — I4892 Unspecified atrial flutter: Secondary | ICD-10-CM

## 2013-12-19 DIAGNOSIS — I4891 Unspecified atrial fibrillation: Secondary | ICD-10-CM

## 2013-12-19 LAB — POCT INR: INR: 2.7

## 2013-12-19 NOTE — Patient Instructions (Signed)
Your physician has requested that you have an echocardiogram. Echocardiography is a painless test that uses sound waves to create images of your heart. It provides your doctor with information about the size and shape of your heart and how well your heart's chambers and valves are working. This procedure takes approximately one hour. There are no restrictions for this procedure.  Your physician has recommended that you wear a holter monitor. Holter monitors are medical devices that record the heart's electrical activity. Doctors most often use these monitors to diagnose arrhythmias. Arrhythmias are problems with the speed or rhythm of the heartbeat. The monitor is a small, portable device. You can wear one while you do your normal daily activities. This is usually used to diagnose what is causing palpitations/syncope (passing out).  You have been referred to Dr. Lovena Le for bradycardia  Your physician wants you to follow-up in: 6 months with Dr. Acie Fredrickson.  You will receive a reminder letter in the mail two months in advance. If you don't receive a letter, please call our office to schedule the follow-up appointment.

## 2013-12-19 NOTE — Progress Notes (Signed)
Oscar Baker Date of Birth  04/04/1933 [place] [address]    [place]   [place], [place]  [address]    [place], [place]  [address] [phone]  Fax  [phone]  [phone]  Fax [phone]  Problem list: 1. Sick sinus syndrome with episodes of atrial flutter, frequent episodes of bradycardia 2. Hypertension 3. Dyslipidemia 4. Diabetes mellitus 5. Arthroscopic knee surgery  History of Present Illness:  Oscar Baker is doing well.  He has asymptomatic bradycardia that we have followed since July 2000.  He has never had any syncope.  He had left knee surgery a year ago and right knee surgery in Dec. 2012.  No cardiac complaints.    He has done well since I last saw him.  He has bad knees and has not been walking much - he tries to avoid salt as much as possible.  He denies any chest pain, shortness breath, or syncope.  July 13, 2012 Oscar Baker is doing well.  No syncope or presyncope.  [name]   January 16, 2013:  Oscar Baker is doing well.  His HR continues to be slow.  He is asymptomatic - no syncope or presyncope.  Feb. 24, 2015:  Oscar Baker is doing well.  No syncope. No pre-syncope.  Getting his coumadin checked monthly.  No cp or dyspnea.  Had a URI this winter. We walked around the office and he increased his HR to 63.  He is able to get his heart rate up into the 70s when he is working out at [employer].  December 19, 2013:  Oscar Baker is here as a work in for fatigue and slow HR.    He has not had any episodes of syncope.   No chest pain , no dyspnea    Current Outpatient Prescriptions on File Prior to Visit  Medication Sig Dispense Refill  . amLODipine (NORVASC) 5 MG tablet Take 1 tablet (5 mg total) by mouth daily.  90 tablet  3  . aspirin 81 MG tablet Take 81 mg by mouth daily.        [name] atorvastatin (LIPITOR) 10 MG tablet Take 20 mg by mouth daily.      [name] Calcium (STOOL SOFTENER PO) Take by mouth as needed.        . fenofibrate micronized (LOFIBRA) 200 MG capsule Take 1 capsule (200 mg total) by mouth daily before breakfast.  90 capsule  3  . fish oil-omega-3 fatty acids 1000 MG capsule Take 2 g by mouth daily.        . fluticasone (FLONASE) 50 MCG/ACT nasal spray Place 1 spray into the nose as needed.       [name] glipiZIDE (GLUCOTROL) 10 MG tablet Take 5 mg by mouth daily.       . metFORMIN (GLUCOPHAGE) 1000 MG tablet Take 1,000 mg by mouth 2 (two) times daily with a meal.        . multivitamin (THERAGRAN) per tablet Take 1 tablet by mouth daily.        . ramipril (ALTACE) 10 MG capsule Take 10 mg by mouth 2 (two) times daily.       . sitaGLIPtan (JANUVIA) 100 MG tablet Take 50 mg by mouth daily.       . vitamin B-12 (CYANOCOBALAMIN) 1000 MCG tablet Take 1,000 mcg by mouth 2 (  two) times daily.       [name] warfarin (COUMADIN) 5 MG tablet Take as directed by coumadin clinic  90 tablet  1   No current facility-administered medications on file prior to visit.    Allergies  Allergen Reactions  . Codeine   . Simvastatin     Leg aches    Past Medical History  Diagnosis Date  . Diabetes mellitus   . Hyperlipidemia   . Hypertension   . Sick sinus syndrome     a-Flutter with episodes of bradycardia    Past Surgical History  Procedure Laterality Date  . Back surgery      disk  . Appendectomy    . Tonsillectomy    . Total knee arthroplasty      History  Smoking status  . Former Smoker  . Types: Pipe  . Quit date: 11/25/1975  Smokeless tobacco  . Never Used    History  Alcohol Use No    Family History  Problem Relation Age of Onset  . Emphysema Mother   . Heart attack Father     Reviw of Systems:  Reviewed in the HPI.  All other systems are negative.  Physical Exam: Blood pressure 120/64, pulse 46, height 5\' 10"  (1.778 m), weight 236 lb (107.049 kg). General: Well developed, well nourished, in no acute  distress.  Head: Normocephalic, atraumatic, sclera non-icteric, mucus membranes are moist,   Neck: Supple. Carotids are 2 + without bruits. No JVD  Lungs: Clear bilaterally to auscultation.  Heart: regular rate  With normal  S1 S2. No murmurs, gallops or rubs.  Abdomen: Soft, non-tender, non-distended with normal bowel sounds. No hepatomegaly. No rebound/guarding. No masses.  Msk:  Strength and tone are normal  Extremities: No clubbing or cyanosis. Trace bilateral edema.  Distal pedal pulses are 2+ and equal bilaterally.  Neuro: Alert and oriented X 3. Moves all extremities spontaneously.  Psych:  Responds to questions appropriately with a normal affect.  ECG  December 19, 2013:  Atrial fib with ventricular rate of  41.    His HR increased to 63 while walking around the office.   Assessment / Plan:

## 2013-12-19 NOTE — Assessment & Plan Note (Signed)
When presents with persistent fatigue. This is gradually worsened of the past several months. He's had bradycardia for quite some time. He's had atrial fibrillation for several years . He's been on Coumadin.  I discussed with Dr. Lovena Le who has agreed to see him for consideration for pacer.  Will repeat his echo ( Lv function was normal in 2009). Will get a 24 HR holter to evaluate his HR at night and see what his average HR is .   I'll see him back in 6 months.

## 2014-01-01 DIAGNOSIS — Z7901 Long term (current) use of anticoagulants: Secondary | ICD-10-CM | POA: Diagnosis not present

## 2014-01-01 DIAGNOSIS — R05 Cough: Secondary | ICD-10-CM | POA: Diagnosis not present

## 2014-01-01 DIAGNOSIS — R059 Cough, unspecified: Secondary | ICD-10-CM | POA: Diagnosis not present

## 2014-01-01 DIAGNOSIS — J40 Bronchitis, not specified as acute or chronic: Secondary | ICD-10-CM | POA: Diagnosis not present

## 2014-01-01 DIAGNOSIS — I4892 Unspecified atrial flutter: Secondary | ICD-10-CM | POA: Diagnosis not present

## 2014-01-01 DIAGNOSIS — Z6833 Body mass index (BMI) 33.0-33.9, adult: Secondary | ICD-10-CM | POA: Diagnosis not present

## 2014-01-03 ENCOUNTER — Encounter (INDEPENDENT_AMBULATORY_CARE_PROVIDER_SITE_OTHER): Payer: Medicare Other

## 2014-01-03 ENCOUNTER — Encounter: Payer: Self-pay | Admitting: *Deleted

## 2014-01-03 DIAGNOSIS — I495 Sick sinus syndrome: Secondary | ICD-10-CM | POA: Diagnosis not present

## 2014-01-03 DIAGNOSIS — I4891 Unspecified atrial fibrillation: Secondary | ICD-10-CM | POA: Diagnosis not present

## 2014-01-03 DIAGNOSIS — R5383 Other fatigue: Secondary | ICD-10-CM | POA: Diagnosis not present

## 2014-01-03 DIAGNOSIS — R5381 Other malaise: Secondary | ICD-10-CM

## 2014-01-03 DIAGNOSIS — R001 Bradycardia, unspecified: Secondary | ICD-10-CM

## 2014-01-03 DIAGNOSIS — R5382 Chronic fatigue, unspecified: Secondary | ICD-10-CM

## 2014-01-03 NOTE — Progress Notes (Signed)
Patient ID: Oscar Baker, male   DOB: [birth year], 80 y.o.   MRN: [id number] E-Cardio 24 hour holter monitor applied to patient.

## 2014-01-04 ENCOUNTER — Ambulatory Visit (HOSPITAL_COMMUNITY): Payer: Medicare Other | Attending: Cardiovascular Disease | Admitting: Radiology

## 2014-01-04 DIAGNOSIS — I4891 Unspecified atrial fibrillation: Secondary | ICD-10-CM

## 2014-01-04 DIAGNOSIS — I10 Essential (primary) hypertension: Secondary | ICD-10-CM | POA: Diagnosis not present

## 2014-01-04 DIAGNOSIS — J309 Allergic rhinitis, unspecified: Secondary | ICD-10-CM | POA: Diagnosis not present

## 2014-01-04 DIAGNOSIS — E785 Hyperlipidemia, unspecified: Secondary | ICD-10-CM | POA: Diagnosis not present

## 2014-01-04 DIAGNOSIS — R001 Bradycardia, unspecified: Secondary | ICD-10-CM

## 2014-01-04 DIAGNOSIS — Z87891 Personal history of nicotine dependence: Secondary | ICD-10-CM | POA: Diagnosis not present

## 2014-01-04 DIAGNOSIS — R5382 Chronic fatigue, unspecified: Secondary | ICD-10-CM

## 2014-01-04 NOTE — Progress Notes (Signed)
Echocardiogram performed.  

## 2014-01-08 ENCOUNTER — Telehealth: Payer: Self-pay | Admitting: Cardiovascular Disease

## 2014-01-08 NOTE — Telephone Encounter (Signed)
Reviewed echo results with patient who verbalized understanding and gratitude.  Patient is aware he will need to keep his appointment with Dr. Lovena Le for Thursday 7/23.

## 2014-01-08 NOTE — Telephone Encounter (Signed)
°  Patient is returning your call, please call back.  °

## 2014-01-11 ENCOUNTER — Ambulatory Visit (INDEPENDENT_AMBULATORY_CARE_PROVIDER_SITE_OTHER): Payer: Medicare Other | Admitting: Internal Medicine

## 2014-01-11 ENCOUNTER — Encounter: Payer: Self-pay | Admitting: *Deleted

## 2014-01-11 ENCOUNTER — Encounter: Payer: Self-pay | Admitting: Internal Medicine

## 2014-01-11 VITALS — BP 144/58 | HR 40 | Ht 70.0 in | Wt 231.8 lb

## 2014-01-11 DIAGNOSIS — I498 Other specified cardiac arrhythmias: Secondary | ICD-10-CM

## 2014-01-11 DIAGNOSIS — R001 Bradycardia, unspecified: Secondary | ICD-10-CM

## 2014-01-11 DIAGNOSIS — I4891 Unspecified atrial fibrillation: Secondary | ICD-10-CM

## 2014-01-11 DIAGNOSIS — I482 Chronic atrial fibrillation, unspecified: Secondary | ICD-10-CM

## 2014-01-11 NOTE — Progress Notes (Signed)
HPI Oscar Baker is referred today by Dr. Acie Fredrickson for evaluation of bradycardia in the setting of atrial fibrillation. The patient notes that he has progressive fatigue, weakness and sob with exertion over the past 2-3 years. He notes that he was diagnosed with atrial fib over 5 years ago. 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 He has been anti-coagulated. He denies peripheral edema. He stays active but does get tired quickly, particularly when he goes up an incline. He wore a cardiac monitor several weeks ago which demonstrated an average heart rate of 46/min and a low rate of 26/min. He has multiple episodes of bradycardia during the day down into the 30's. An echo demonstrated normal LV function.  He has not had syncope. Allergies  Allergen Reactions  . Simvastatin     Leg aches  . Codeine      Current Outpatient Prescriptions  Medication Sig Dispense Refill  . amLODipine (NORVASC) 5 MG tablet Take 1 tablet (5 mg total) by mouth daily.  90 tablet  3  . aspirin 81 MG tablet Take 81 mg by mouth daily.        [name] atorvastatin (LIPITOR) 10 MG tablet Take 20 mg by mouth daily.      [name] Calcium (STOOL SOFTENER PO) Take 1 capsule by mouth as needed.       . fenofibrate micronized (LOFIBRA) 200 MG capsule Take 1 capsule (200 mg total) by mouth daily before breakfast.  90 capsule  3  . fish oil-omega-3 fatty acids 1000 MG capsule Take 2 g by mouth daily.        . fluticasone (FLONASE) 50 MCG/ACT nasal spray Place 1 spray into the nose as needed.       [name] glipiZIDE (GLUCOTROL) 10 MG tablet Take 5 mg by mouth daily.       . metFORMIN (GLUCOPHAGE) 1000 MG tablet Take 1,000 mg by mouth 2 (two) times daily with a meal.        . multivitamin (THERAGRAN) per tablet Take 1 tablet by mouth daily.        . ramipril (ALTACE) 10 MG capsule Take 10 mg by mouth 2 (two) times daily.       . sitaGLIPtan (JANUVIA) 100 MG tablet Take 50 mg by mouth daily.       . vitamin B-12 (CYANOCOBALAMIN) 1000 MCG tablet Take  1,000 mcg by mouth 2 (two) times daily.       [name] warfarin (COUMADIN) 5 MG tablet Take as directed by coumadin clinic  90 tablet  1   No current facility-administered medications for this visit.     Past Medical History  Diagnosis Date  . Diabetes mellitus   . Hyperlipidemia   . Hypertension   . Sick sinus syndrome     a-Flutter with episodes of bradycardia    ROS:   All systems reviewed and negative except as noted in the HPI.   Past Surgical History  Procedure Laterality Date  . Back surgery      disk  . Appendectomy    . Tonsillectomy    . Total knee arthroplasty       Family History  Problem Relation Age of Onset  . Emphysema Mother   . Heart attack Father      History   Social History  . Marital Status: Married    Spouse Name: N/A    Number of Children: N/A  . Years of Education: N/A   Occupational History  .  Not on file.   Social History Main Topics  . Smoking status: Former Smoker    Types: Pipe    Quit date: 11/25/1975  . Smokeless tobacco: Never Used  . Alcohol Use: No  . Drug Use: No  . Sexual Activity: Not on file   Other Topics Concern  . Not on file   Social History Narrative  . No narrative on file     BP 144/58  Pulse 40  Ht 5\' 10"  (1.778 m)  Wt 231 lb 12.8 oz (105.144 kg)  BMI 33.26 kg/m2  Physical Exam:  Well appearing 80 yo man, NAD HEENT: Unremarkable Neck:  No JVD, no thyromegally Back:  No CVA tenderness Lungs:  Clear with no wheezes HEART:  IRegular brady rhythm, no murmurs, no rubs, no clicks Abd:  soft, positive bowel sounds, no organomegally, no rebound, no guarding Ext:  2 plus pulses, no edema, no cyanosis, no clubbing Skin:  No rashes no nodules Neuro:  CN II through XII intact, motor grossly intact  EKG - atrial fibrillation with a slow ventricular response.   Assess/Plan:

## 2014-01-11 NOTE — Patient Instructions (Signed)
Your physician has recommended that you have a pacemaker inserted. A pacemaker is a small device that is placed under the skin of your chest or abdomen to help control abnormal heart rhythms. This device uses electrical pulses to prompt the heart to beat at a normal rate. Pacemakers are used to treat heart rhythms that are too slow. Wire (leads) are attached to the pacemaker that goes into the chambers of you heart. This is done in the hospital and usually requires and overnight stay. Please see the instruction sheet given to you today for more information.  See instruction sheet 

## 2014-01-11 NOTE — Assessment & Plan Note (Signed)
His ventricular rate is very slow. He has chronotropic incompetence. I have recommended insertion of a rate responsive closed loop stimulation ppm. The risk/benefit/goals/expectations of the procedure have been discussed with the patient and he wishes to proceed.

## 2014-01-17 DIAGNOSIS — H6120 Impacted cerumen, unspecified ear: Secondary | ICD-10-CM | POA: Diagnosis not present

## 2014-01-30 ENCOUNTER — Ambulatory Visit (INDEPENDENT_AMBULATORY_CARE_PROVIDER_SITE_OTHER): Payer: Medicare Other | Admitting: Pharmacist

## 2014-01-30 DIAGNOSIS — I10 Essential (primary) hypertension: Secondary | ICD-10-CM | POA: Diagnosis not present

## 2014-01-30 DIAGNOSIS — E538 Deficiency of other specified B group vitamins: Secondary | ICD-10-CM | POA: Diagnosis not present

## 2014-01-30 DIAGNOSIS — I4891 Unspecified atrial fibrillation: Secondary | ICD-10-CM

## 2014-01-30 DIAGNOSIS — Z125 Encounter for screening for malignant neoplasm of prostate: Secondary | ICD-10-CM | POA: Diagnosis not present

## 2014-01-30 DIAGNOSIS — E119 Type 2 diabetes mellitus without complications: Secondary | ICD-10-CM | POA: Diagnosis not present

## 2014-01-30 DIAGNOSIS — I4892 Unspecified atrial flutter: Secondary | ICD-10-CM | POA: Diagnosis not present

## 2014-01-30 DIAGNOSIS — E785 Hyperlipidemia, unspecified: Secondary | ICD-10-CM | POA: Diagnosis not present

## 2014-01-30 LAB — POCT INR: INR: 2.5

## 2014-02-06 ENCOUNTER — Encounter (HOSPITAL_COMMUNITY): Payer: Self-pay | Admitting: Pharmacy Technician

## 2014-02-06 DIAGNOSIS — Z1212 Encounter for screening for malignant neoplasm of rectum: Secondary | ICD-10-CM | POA: Diagnosis not present

## 2014-02-06 DIAGNOSIS — E538 Deficiency of other specified B group vitamins: Secondary | ICD-10-CM | POA: Diagnosis not present

## 2014-02-06 DIAGNOSIS — R609 Edema, unspecified: Secondary | ICD-10-CM | POA: Diagnosis not present

## 2014-02-06 DIAGNOSIS — I495 Sick sinus syndrome: Secondary | ICD-10-CM | POA: Diagnosis not present

## 2014-02-06 DIAGNOSIS — Z1331 Encounter for screening for depression: Secondary | ICD-10-CM | POA: Diagnosis not present

## 2014-02-06 DIAGNOSIS — C9110 Chronic lymphocytic leukemia of B-cell type not having achieved remission: Secondary | ICD-10-CM | POA: Diagnosis not present

## 2014-02-06 DIAGNOSIS — Z0000 Encounter for general adult medical examination without abnormal findings: Secondary | ICD-10-CM | POA: Diagnosis not present

## 2014-02-06 DIAGNOSIS — I4892 Unspecified atrial flutter: Secondary | ICD-10-CM | POA: Diagnosis not present

## 2014-02-06 DIAGNOSIS — Z7901 Long term (current) use of anticoagulants: Secondary | ICD-10-CM | POA: Diagnosis not present

## 2014-02-06 DIAGNOSIS — I2789 Other specified pulmonary heart diseases: Secondary | ICD-10-CM | POA: Diagnosis not present

## 2014-02-07 ENCOUNTER — Telehealth: Payer: Self-pay | Admitting: Oncology

## 2014-02-07 DIAGNOSIS — I4891 Unspecified atrial fibrillation: Secondary | ICD-10-CM | POA: Diagnosis not present

## 2014-02-07 DIAGNOSIS — I442 Atrioventricular block, complete: Secondary | ICD-10-CM | POA: Diagnosis not present

## 2014-02-07 DIAGNOSIS — Z87891 Personal history of nicotine dependence: Secondary | ICD-10-CM | POA: Diagnosis not present

## 2014-02-07 DIAGNOSIS — I10 Essential (primary) hypertension: Secondary | ICD-10-CM | POA: Diagnosis not present

## 2014-02-07 DIAGNOSIS — E785 Hyperlipidemia, unspecified: Secondary | ICD-10-CM | POA: Diagnosis not present

## 2014-02-07 DIAGNOSIS — E119 Type 2 diabetes mellitus without complications: Secondary | ICD-10-CM | POA: Diagnosis not present

## 2014-02-07 DIAGNOSIS — Z7901 Long term (current) use of anticoagulants: Secondary | ICD-10-CM | POA: Diagnosis not present

## 2014-02-07 DIAGNOSIS — I498 Other specified cardiac arrhythmias: Secondary | ICD-10-CM | POA: Diagnosis not present

## 2014-02-07 DIAGNOSIS — Z7982 Long term (current) use of aspirin: Secondary | ICD-10-CM | POA: Diagnosis not present

## 2014-02-07 MED ORDER — CEFAZOLIN SODIUM-DEXTROSE 2-3 GM-% IV SOLR: 2.0000 g | INTRAVENOUS | Status: DC

## 2014-02-07 MED ORDER — SODIUM CHLORIDE 0.9 % IV SOLN
INTRAVENOUS | Status: DC
  Administered 2014-02-08: 07:00:00 via INTRAVENOUS

## 2014-02-07 MED ORDER — GENTAMICIN SULFATE 40 MG/ML IJ SOLN
80.0000 mg | INTRAMUSCULAR | Status: DC
  Filled 2014-02-07 (×2): qty 2

## 2014-02-07 MED ORDER — CHLORHEXIDINE GLUCONATE 4 % EX LIQD
60.0000 mL | Freq: Once | CUTANEOUS | Status: DC
  Filled 2014-02-07: qty 60

## 2014-02-07 NOTE — Telephone Encounter (Signed)
C/D 02/07/14 for appt. 02/14/14

## 2014-02-07 NOTE — Telephone Encounter (Signed)
S/W PATIENT AND GAVE NP APPT FOR 08/26 @ 10:30 W/DR. SHADAD,

## 2014-02-08 ENCOUNTER — Ambulatory Visit (HOSPITAL_COMMUNITY)
Admission: RE | Admit: 2014-02-08 | Discharge: 2014-02-09 | Disposition: A | Discharge status: Home / Self Care | Payer: Medicare Other | Source: Ambulatory Visit | Attending: Internal Medicine | Admitting: Internal Medicine

## 2014-02-08 ENCOUNTER — Encounter (HOSPITAL_COMMUNITY)
Admission: RE | Disposition: A | Discharge status: Home / Self Care | Payer: Medicare Other | Source: Ambulatory Visit | Attending: Internal Medicine

## 2014-02-08 ENCOUNTER — Encounter (HOSPITAL_COMMUNITY): Payer: Self-pay | Admitting: Physician Assistant

## 2014-02-08 DIAGNOSIS — R001 Bradycardia, unspecified: Secondary | ICD-10-CM

## 2014-02-08 DIAGNOSIS — Z7982 Long term (current) use of aspirin: Secondary | ICD-10-CM | POA: Insufficient documentation

## 2014-02-08 DIAGNOSIS — E785 Hyperlipidemia, unspecified: Secondary | ICD-10-CM | POA: Insufficient documentation

## 2014-02-08 DIAGNOSIS — I498 Other specified cardiac arrhythmias: Secondary | ICD-10-CM | POA: Insufficient documentation

## 2014-02-08 DIAGNOSIS — Z87891 Personal history of nicotine dependence: Secondary | ICD-10-CM | POA: Insufficient documentation

## 2014-02-08 DIAGNOSIS — E119 Type 2 diabetes mellitus without complications: Secondary | ICD-10-CM

## 2014-02-08 DIAGNOSIS — Z950 Presence of cardiac pacemaker: Secondary | ICD-10-CM

## 2014-02-08 DIAGNOSIS — I442 Atrioventricular block, complete: Secondary | ICD-10-CM | POA: Diagnosis not present

## 2014-02-08 DIAGNOSIS — I10 Essential (primary) hypertension: Secondary | ICD-10-CM | POA: Insufficient documentation

## 2014-02-08 DIAGNOSIS — Z7901 Long term (current) use of anticoagulants: Secondary | ICD-10-CM | POA: Insufficient documentation

## 2014-02-08 DIAGNOSIS — I4891 Unspecified atrial fibrillation: Secondary | ICD-10-CM | POA: Insufficient documentation

## 2014-02-08 HISTORY — DX: Presence of cardiac pacemaker: Z95.0

## 2014-02-08 HISTORY — PX: PERMANENT PACEMAKER INSERTION: SHX5480

## 2014-02-08 HISTORY — DX: Type 2 diabetes mellitus without complications: E11.9

## 2014-02-08 LAB — BASIC METABOLIC PANEL
ANION GAP: 11 (ref 5–15)
BUN: 19 mg/dL (ref 6–23)
CO2: 23 mEq/L (ref 19–32)
CREATININE: 1.16 mg/dL (ref 0.50–1.35)
Calcium: 9.5 mg/dL (ref 8.4–10.5)
Chloride: 106 mEq/L (ref 96–112)
GFR calc Af Amer: 67 mL/min — ABNORMAL LOW (ref >90)
GFR, EST NON AFRICAN AMERICAN: 58 mL/min — AB (ref >90)
Glucose, Bld: 119 mg/dL — ABNORMAL HIGH (ref 70–99)
Potassium: 4.7 mEq/L (ref 3.7–5.3)
SODIUM: 140 meq/L (ref 137–147)

## 2014-02-08 LAB — CBC
HCT: 38.9 % — ABNORMAL LOW (ref 39.0–52.0)
Hemoglobin: 13.5 g/dL (ref 13.0–17.0)
MCH: 34.5 pg — ABNORMAL HIGH (ref 26.0–34.0)
MCHC: 34.7 g/dL (ref 30.0–36.0)
MCV: 99.5 fL (ref 78.0–100.0)
PLATELETS: 233 10*3/uL (ref 150–400)
RBC: 3.91 MIL/uL — ABNORMAL LOW (ref 4.22–5.81)
RDW: 19 % — ABNORMAL HIGH (ref 11.5–15.5)
WBC: 31.8 10*3/uL — ABNORMAL HIGH (ref 4.0–10.5)

## 2014-02-08 LAB — SURGICAL PCR SCREEN
MRSA, PCR: NEGATIVE
Staphylococcus aureus: NEGATIVE

## 2014-02-08 LAB — GLUCOSE, CAPILLARY
GLUCOSE-CAPILLARY: 170 mg/dL — AB (ref 70–99)
Glucose-Capillary: 132 mg/dL — ABNORMAL HIGH (ref 70–99)
Glucose-Capillary: 154 mg/dL — ABNORMAL HIGH (ref 70–99)
Glucose-Capillary: 100 mg/dL — ABNORMAL HIGH (ref 70–99)

## 2014-02-08 LAB — PROTIME-INR
INR: 1.77 — ABNORMAL HIGH (ref 0.00–1.49)
PROTHROMBIN TIME: 20.6 s — AB (ref 11.6–15.2)

## 2014-02-08 SURGERY — PERMANENT PACEMAKER INSERTION
Anesthesia: LOCAL

## 2014-02-08 MED ORDER — HEPARIN (PORCINE) IN NACL 2-0.9 UNIT/ML-% IJ SOLN
INTRAMUSCULAR | Status: AC
  Filled 2014-02-08: qty 500

## 2014-02-08 MED ORDER — MUPIROCIN 2 % EX OINT
TOPICAL_OINTMENT | CUTANEOUS | Status: AC
  Filled 2014-02-08: qty 22

## 2014-02-08 MED ORDER — CEFAZOLIN SODIUM-DEXTROSE 2-3 GM-% IV SOLR
INTRAVENOUS | Status: AC
  Filled 2014-02-08: qty 50

## 2014-02-08 MED ORDER — ASPIRIN EC 81 MG PO TBEC
81.0000 mg | DELAYED_RELEASE_TABLET | Freq: Every day | ORAL | Status: DC
  Administered 2014-02-08: 81 mg via ORAL
  Filled 2014-02-08 (×2): qty 1

## 2014-02-08 MED ORDER — INSULIN ASPART 100 UNIT/ML ~~LOC~~ SOLN: 0.0000 [IU] | Freq: Every day | SUBCUTANEOUS | Status: DC

## 2014-02-08 MED ORDER — FENTANYL CITRATE 0.05 MG/ML IJ SOLN
INTRAMUSCULAR | Status: AC
  Filled 2014-02-08: qty 2

## 2014-02-08 MED ORDER — ACETAMINOPHEN 325 MG PO TABS: 325.0000 mg | ORAL_TABLET | ORAL | Status: DC | PRN

## 2014-02-08 MED ORDER — WARFARIN SODIUM 7.5 MG PO TABS
7.5000 mg | ORAL_TABLET | Freq: Once | ORAL | Status: AC
  Administered 2014-02-08: 7.5 mg via ORAL
  Filled 2014-02-08: qty 1

## 2014-02-08 MED ORDER — LINAGLIPTIN 5 MG PO TABS
5.0000 mg | ORAL_TABLET | Freq: Every day | ORAL | Status: DC
  Filled 2014-02-08 (×2): qty 1

## 2014-02-08 MED ORDER — METFORMIN HCL 500 MG PO TABS
1000.0000 mg | ORAL_TABLET | Freq: Two times a day (BID) | ORAL | Status: DC
  Administered 2014-02-09: 1000 mg via ORAL
  Filled 2014-02-08 (×4): qty 2

## 2014-02-08 MED ORDER — MUPIROCIN 2 % EX OINT
1.0000 "application " | TOPICAL_OINTMENT | Freq: Once | CUTANEOUS | Status: AC
  Administered 2014-02-08: 1 via TOPICAL

## 2014-02-08 MED ORDER — VITAMIN B-12 1000 MCG PO TABS
2000.0000 ug | ORAL_TABLET | Freq: Every day | ORAL | Status: DC
  Administered 2014-02-08: 2000 ug via ORAL
  Filled 2014-02-08 (×2): qty 2

## 2014-02-08 MED ORDER — ZOLPIDEM TARTRATE 5 MG PO TABS: 5.0000 mg | ORAL_TABLET | Freq: Every evening | ORAL | Status: DC | PRN

## 2014-02-08 MED ORDER — RAMIPRIL 10 MG PO CAPS
10.0000 mg | ORAL_CAPSULE | Freq: Every day | ORAL | Status: DC
  Administered 2014-02-08: 10 mg via ORAL
  Filled 2014-02-08 (×2): qty 1

## 2014-02-08 MED ORDER — ATORVASTATIN CALCIUM 20 MG PO TABS
20.0000 mg | ORAL_TABLET | Freq: Every day | ORAL | Status: DC
  Administered 2014-02-08: 20 mg via ORAL
  Filled 2014-02-08 (×2): qty 1

## 2014-02-08 MED ORDER — MIDAZOLAM HCL 5 MG/5ML IJ SOLN
INTRAMUSCULAR | Status: AC
  Filled 2014-02-08: qty 5

## 2014-02-08 MED ORDER — OMEGA-3-ACID ETHYL ESTERS 1 G PO CAPS
2.0000 g | ORAL_CAPSULE | Freq: Every day | ORAL | Status: DC
  Administered 2014-02-08: 2 g via ORAL
  Filled 2014-02-08 (×2): qty 2

## 2014-02-08 MED ORDER — AMLODIPINE BESYLATE 5 MG PO TABS
5.0000 mg | ORAL_TABLET | Freq: Every day | ORAL | Status: DC
  Administered 2014-02-08: 5 mg via ORAL
  Filled 2014-02-08 (×2): qty 1

## 2014-02-08 MED ORDER — LIDOCAINE HCL (PF) 1 % IJ SOLN
INTRAMUSCULAR | Status: AC
  Filled 2014-02-08: qty 60

## 2014-02-08 MED ORDER — DOCUSATE SODIUM 100 MG PO CAPS
100.0000 mg | ORAL_CAPSULE | Freq: Every day | ORAL | Status: DC
  Filled 2014-02-08 (×2): qty 1

## 2014-02-08 MED ORDER — FENOFIBRATE 160 MG PO TABS
160.0000 mg | ORAL_TABLET | Freq: Every day | ORAL | Status: DC
  Administered 2014-02-08: 160 mg via ORAL
  Filled 2014-02-08 (×2): qty 1

## 2014-02-08 MED ORDER — LORATADINE 10 MG PO TABS
10.0000 mg | ORAL_TABLET | Freq: Every day | ORAL | Status: DC
  Filled 2014-02-08 (×2): qty 1

## 2014-02-08 MED ORDER — ONDANSETRON HCL 4 MG/2ML IJ SOLN: 4.0000 mg | Freq: Four times a day (QID) | INTRAMUSCULAR | Status: DC | PRN

## 2014-02-08 MED ORDER — CEFAZOLIN SODIUM 1-5 GM-% IV SOLN
1.0000 g | Freq: Four times a day (QID) | INTRAVENOUS | Status: AC
  Administered 2014-02-08 – 2014-02-09 (×3): 1 g via INTRAVENOUS
  Filled 2014-02-08 (×3): qty 50

## 2014-02-08 MED ORDER — WARFARIN - PHARMACIST DOSING INPATIENT: Freq: Every day | Status: DC

## 2014-02-08 MED ORDER — ADULT MULTIVITAMIN W/MINERALS CH
1.0000 | ORAL_TABLET | Freq: Every day | ORAL | Status: DC
  Administered 2014-02-08: 1 via ORAL
  Filled 2014-02-08 (×2): qty 1

## 2014-02-08 MED ORDER — INSULIN ASPART 100 UNIT/ML ~~LOC~~ SOLN
0.0000 [IU] | Freq: Three times a day (TID) | SUBCUTANEOUS | Status: DC
  Administered 2014-02-08: 3 [IU] via SUBCUTANEOUS

## 2014-02-08 NOTE — Progress Notes (Signed)
UR Completed Myrel Rappleye Graves-Bigelow, RN,BSN [phone]  

## 2014-02-08 NOTE — CV Procedure (Signed)
SURGEON:  Cristopher Peru, MD     PREPROCEDURE DIAGNOSIS:  Symptomatic Bradycardia due to complete heart block and atrial fib    POSTPROCEDURE DIAGNOSIS:  Same as preprocedure diagnosis     PROCEDURES:   1. Pacemaker implantation.     INTRODUCTION: Oscar Baker is a 80 y.o. male  with a history of bradycardia who presents today for pacemaker implantation.  The patient reports intermittent episodes of dizziness over the past few months.  No reversible causes have been identified.  The patient therefore presents today for pacemaker implantation.     DESCRIPTION OF PROCEDURE:  Informed written consent was obtained, and   the patient was brought to the electrophysiology lab in a fasting state.  The patient required no sedation for the procedure today.  The patients left chest was prepped and draped in the usual sterile fashion by the EP lab staff. The skin overlying the left deltopectoral region was infiltrated with lidocaine for local analgesia.  A 4-cm incision was made over the left deltopectoral region.  A left subcutaneous pacemaker pocket was fashioned using a combination of sharp and blunt dissection. Electrocautery was required to assure hemostasis.     RV Lead Placement: The left axillary vein was therefore directly visualized and cannulated.  Through the left axillary vein, a Biotronik(serial number [id number]) active fixation right ventricular lead was advanced with fluoroscopic visualization into the right ventricular apical septal positions respectively.  Initial right ventricular lead R-waves measured 10.9mV with an impedance of 716 ohms and a threshold of 0.7 V at 0.5 msec.  The RV lead was secured to the pectoralis fascia using #2-0 silk over the suture sleeve.   Device Placement:  The leads were then connected to a Biotronik (serial number [id number]) pacemaker.  The pocket was irrigated with copious gentamicin solution.  The pacemaker was then placed into the pocket.  The pocket was then  closed in 2 layers with 2.0 Vicryl suture for the subcutaneous and subcuticular layers.  Steri-Strips and a sterile dressing were then applied.  There were no early apparent complications.     CONCLUSIONS:   1. Successful implantation of a Biotronik single chamber pacemaker for symptomatic bradycardia due to complete heart block  2. No early apparent complications.           Cristopher Peru, MD 02/08/2014 8:49 AM

## 2014-02-08 NOTE — H&P (View-Only) (Signed)
HPI [name] is referred today by Dr. Acie Fredrickson for evaluation of bradycardia in the setting of atrial fibrillation. The patient notes that he has progressive fatigue, weakness and sob with exertion over the past 2-3 years. He notes that he was diagnosed with atrial fib over 5 years ago. 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 He has been anti-coagulated. He denies peripheral edema. He stays active but does get tired quickly, particularly when he goes up an incline. He wore a cardiac monitor several weeks ago which demonstrated an average heart rate of 46/min and a low rate of 26/min. He has multiple episodes of bradycardia during the day down into the 30's. An echo demonstrated normal LV function.  He has not had syncope. Allergies  Allergen Reactions  . Simvastatin     Leg aches  . Codeine      Current Outpatient Prescriptions  Medication Sig Dispense Refill  . amLODipine (NORVASC) 5 MG tablet Take 1 tablet (5 mg total) by mouth daily.  90 tablet  3  . aspirin 81 MG tablet Take 81 mg by mouth daily.        [name] atorvastatin (LIPITOR) 10 MG tablet Take 20 mg by mouth daily.      [name] Calcium (STOOL SOFTENER PO) Take 1 capsule by mouth as needed.       . fenofibrate micronized (LOFIBRA) 200 MG capsule Take 1 capsule (200 mg total) by mouth daily before breakfast.  90 capsule  3  . fish oil-omega-3 fatty acids 1000 MG capsule Take 2 g by mouth daily.        . fluticasone (FLONASE) 50 MCG/ACT nasal spray Place 1 spray into the nose as needed.       [name] glipiZIDE (GLUCOTROL) 10 MG tablet Take 5 mg by mouth daily.       . metFORMIN (GLUCOPHAGE) 1000 MG tablet Take 1,000 mg by mouth 2 (two) times daily with a meal.        . multivitamin (THERAGRAN) per tablet Take 1 tablet by mouth daily.        . ramipril (ALTACE) 10 MG capsule Take 10 mg by mouth 2 (two) times daily.       . sitaGLIPtan (JANUVIA) 100 MG tablet Take 50 mg by mouth daily.       . vitamin B-12 (CYANOCOBALAMIN) 1000 MCG tablet Take  1,000 mcg by mouth 2 (two) times daily.       [name] warfarin (COUMADIN) 5 MG tablet Take as directed by coumadin clinic  90 tablet  1   No current facility-administered medications for this visit.     Past Medical History  Diagnosis Date  . Diabetes mellitus   . Hyperlipidemia   . Hypertension   . Sick sinus syndrome     a-Flutter with episodes of bradycardia    ROS:   All systems reviewed and negative except as noted in the HPI.   Past Surgical History  Procedure Laterality Date  . Back surgery      disk  . Appendectomy    . Tonsillectomy    . Total knee arthroplasty       Family History  Problem Relation Age of Onset  . Emphysema Mother   . Heart attack Father      History   Social History  . Marital Status: Married    Spouse Name: N/A    Number of Children: N/A  . Years of Education: N/A   Occupational History  .  Not on file.   Social History Main Topics  . Smoking status: Former Smoker    Types: Pipe    Quit date: 11/25/1975  . Smokeless tobacco: Never Used  . Alcohol Use: No  . Drug Use: No  . Sexual Activity: Not on file   Other Topics Concern  . Not on file   Social History Narrative  . No narrative on file     BP 144/58  Pulse 40  Ht 5\' 10"  (1.778 m)  Wt 231 lb 12.8 oz (105.144 kg)  BMI 33.26 kg/m2  Physical Exam:  Well appearing 80 yo man, NAD HEENT: Unremarkable Neck:  No JVD, no thyromegally Back:  No CVA tenderness Lungs:  Clear with no wheezes HEART:  IRegular brady rhythm, no murmurs, no rubs, no clicks Abd:  soft, positive bowel sounds, no organomegally, no rebound, no guarding Ext:  2 plus pulses, no edema, no cyanosis, no clubbing Skin:  No rashes no nodules Neuro:  CN II through XII intact, motor grossly intact  EKG - atrial fibrillation with a slow ventricular response.   Assess/Plan:

## 2014-02-08 NOTE — Progress Notes (Signed)
ANTICOAGULATION CONSULT NOTE - Initial Consult  Pharmacy Consult for warfarin Indication: atrial fibrillation  Allergies  Allergen Reactions  . Codeine Nausea And Vomiting  . Simvastatin Other (See Comments)    Leg aches    Patient Measurements: Height: 5\' 10"  (177.8 cm) Weight: 225 lb (102.059 kg) IBW/kg (Calculated) : 73   Vital Signs: Temp: 97.6 F (36.4 C) (08/20 0921) Temp src: Oral (08/20 0921) BP: 139/68 mmHg (08/20 0921) Pulse Rate: 59 (08/20 0921)  Labs:  Recent Labs  02/08/14 0700  HGB 13.5  HCT 38.9*  PLT 233  LABPROT 20.6*  INR 1.77*  CREATININE 1.16    Estimated Creatinine Clearance: 61.8 ml/min (by C-G formula based on Cr of 1.16).   Medical History: Past Medical History  Diagnosis Date  . Diabetes mellitus type 2, noninsulin dependent   . Hyperlipidemia   . Hypertension   . Sick sinus syndrome     a-Flutter with episodes of bradycardia; S/P Biotronik (serial number [id number])     Assessment: 78 YOM s/p pacemaker insertion today. Home warfarin dose is 5mg  daily with last dose taken on 8/17. INR this morning a little low at 1.77. Hgb and plts wnl. No overt bleeding noted.  Goal of Therapy:  INR 2-3 Monitor platelets by anticoagulation protocol: Yes   Plan:  1. Warfarin 7.5mg  po x1 tonight 2. Daily PT/INR 3. Follow for s/s bleeding and need to bridge  Davidjames Blansett D. Orra Nolde, PharmD, BCPS Clinical Pharmacist Pager: [phone] 02/08/2014 10:54 AM

## 2014-02-08 NOTE — Interval H&P Note (Signed)
History and Physical Interval Note:  02/08/2014 7:22 AM  [place]  has presented today for surgery, with the diagnosis of bradycardia  The various methods of treatment have been discussed with the patient and family. After consideration of risks, benefits and other options for treatment, the patient has consented to  Procedure(s): PERMANENT PACEMAKER INSERTION (N/A) as a surgical intervention .  The patient's history has been reviewed, patient examined, no change in status, stable for surgery.  I have reviewed the patient's chart and labs.  Questions were answered to the patient's satisfaction.     Mikle Bosworth.D.

## 2014-02-08 NOTE — Discharge Summary (Signed)
ELECTROPHYSIOLOGY PROCEDURE DISCHARGE SUMMARY    Patient ID: Oscar Baker,  MRN: [id number], DOB/AGE: 1932-11-30 80 y.o.  Admit date: 02/08/2014 Discharge date: 02/09/2014  Primary Care Physician: [name], MD Primary Cardiologist: N[place] Electrophysiologist: Lovena Le  Primary Discharge Diagnosis:  Symptomatic bradycardia status post pacemaker implantation this admission  Secondary Discharge Diagnosis:  1.  Permanent atrial fibrillation 2.  Diabetes 3.  Hypertension 4.  Hyperlipidemia  Allergies  Allergen Reactions  . Codeine Nausea And Vomiting  . Simvastatin Other (See Comments)    Leg aches     Procedures This Admission:  1.  Implantation of a BTK single chamber pacemaker on 02-08-14 by Dr Lovena Le.  See op note for full details.  There were no early apparent complications.  2.  CXR on 02-09-14 demonstrated no PTX  Brief HPI: Oscar Baker is a 80 y.o. male with a past medical history of permanent atrial fibrillation and symptomatic bradycardia.  He was referred to EP in the outpatient setting for treatment options.  Risks, benefits, and alternatives to pacemaker implantation were reviewed with the patient who wished to proceed.   Hospital Course:  The patient was admitted and underwent implantation of a Biotronik single chamber pacemaker with details as outlined above.   He was monitored on telemetry overnight which demonstrated atrial fibrillation with ventricular pacing.  Left chest was without hematoma or ecchymosis.  The device was interrogated and found to be functioning normally.  CXR was obtained and demonstrated no pneumothorax status post device implantation.  Wound care, arm mobility, and restrictions were reviewed with the patient.  Dr Lovena Le examined the patient and considered them stable for discharge to home.    Discharge Vitals: Blood pressure 126/57, pulse 59, temperature 98.2 F (36.8 C), temperature source Oral, resp. rate 18, height 5\' 10"  (1.778  m), weight 224 lb 13.5 oz (101.988 kg), SpO2 96.00%.   Labs:   Lab Results  Component Value Date   WBC 28.3* 02/09/2014   HGB 13.8 02/09/2014   HCT 40.8 02/09/2014   MCV 102.5* 02/09/2014   PLT 232 02/09/2014     Recent Labs Lab 02/08/14 0700  NA 140  K 4.7  CL 106  CO2 23  BUN 19  CREATININE 1.16  CALCIUM 9.5  GLUCOSE 119*     Discharge Medications:    Medication List    ASK your doctor about these medications       amLODipine 5 MG tablet  Commonly known as:  NORVASC  Take 1 tablet (5 mg total) by mouth daily.     aspirin 81 MG tablet  Take 81 mg by mouth daily.     atorvastatin 10 MG tablet  Commonly known as:  LIPITOR  Take 20 mg by mouth daily.     fenofibrate micronized 200 MG capsule  Commonly known as:  LOFIBRA  Take 1 capsule (200 mg total) by mouth daily before breakfast.     fexofenadine 180 MG tablet  Commonly known as:  ALLEGRA  Take 180 mg by mouth daily.     fish oil-omega-3 fatty acids 1000 MG capsule  Take 2 g by mouth daily.     metFORMIN 1000 MG tablet  Commonly known as:  GLUCOPHAGE  Take 1,000 mg by mouth 2 (two) times daily with a meal.     multivitamin per tablet  Take 1 tablet by mouth daily.     ramipril 10 MG capsule  Commonly known as:  ALTACE  Take 10 mg  by mouth daily.     sitaGLIPtin 100 MG tablet  Commonly known as:  JANUVIA  Take 50 mg by mouth every other day.     STOOL SOFTENER PO  Take 1 capsule by mouth daily.     vitamin B-12 1000 MCG tablet  Commonly known as:  CYANOCOBALAMIN  Take 2,000 mcg by mouth daily.     warfarin 5 MG tablet  Commonly known as:  COUMADIN  Take 5 mg by mouth daily.        Disposition:     Duration of Discharge Encounter: Greater than 30 minutes including physician time.  Signed,  Mikle Bosworth.D.

## 2014-02-09 ENCOUNTER — Encounter (HOSPITAL_COMMUNITY): Payer: Self-pay | Admitting: General Practice

## 2014-02-09 ENCOUNTER — Ambulatory Visit (HOSPITAL_COMMUNITY): Payer: Medicare Other

## 2014-02-09 DIAGNOSIS — E785 Hyperlipidemia, unspecified: Secondary | ICD-10-CM | POA: Diagnosis not present

## 2014-02-09 DIAGNOSIS — I10 Essential (primary) hypertension: Secondary | ICD-10-CM | POA: Diagnosis not present

## 2014-02-09 DIAGNOSIS — Z45018 Encounter for adjustment and management of other part of cardiac pacemaker: Secondary | ICD-10-CM | POA: Diagnosis not present

## 2014-02-09 DIAGNOSIS — I498 Other specified cardiac arrhythmias: Secondary | ICD-10-CM | POA: Diagnosis not present

## 2014-02-09 DIAGNOSIS — I4891 Unspecified atrial fibrillation: Secondary | ICD-10-CM | POA: Diagnosis not present

## 2014-02-09 DIAGNOSIS — E119 Type 2 diabetes mellitus without complications: Secondary | ICD-10-CM | POA: Diagnosis not present

## 2014-02-09 DIAGNOSIS — I442 Atrioventricular block, complete: Secondary | ICD-10-CM | POA: Diagnosis not present

## 2014-02-09 LAB — PROTIME-INR
INR: 1.39 (ref 0.00–1.49)
PROTHROMBIN TIME: 17.1 s — AB (ref 11.6–15.2)

## 2014-02-09 LAB — CBC
HCT: 40.8 % (ref 39.0–52.0)
Hemoglobin: 13.8 g/dL (ref 13.0–17.0)
MCH: 34.7 pg — ABNORMAL HIGH (ref 26.0–34.0)
MCHC: 33.8 g/dL (ref 30.0–36.0)
MCV: 102.5 fL — AB (ref 78.0–100.0)
PLATELETS: 232 10*3/uL (ref 150–400)
RBC: 3.98 MIL/uL — ABNORMAL LOW (ref 4.22–5.81)
RDW: 19.1 % — AB (ref 11.5–15.5)
WBC: 28.3 10*3/uL — ABNORMAL HIGH (ref 4.0–10.5)

## 2014-02-09 NOTE — Discharge Instructions (Signed)
° °  Supplemental Discharge Instructions for  Pacemaker/Defibrillator Patients  Activity No heavy lifting or vigorous activity with your left/right arm for 6 to 8 weeks.  Do not raise your left/right arm above your head for one week.  Gradually raise your affected arm as drawn below.                       08/24                   08/25                    08/26                    08/27            NO DRIVING for  one week    ; you may begin driving on     47/[address]     . WOUND CARE   Keep the wound area clean and dry.  Do not get this area wet for one week. No showers for one week; you may shower on      08/28        .   The tape/steri-strips on your wound will fall off; do not pull them off.  No bandage is needed on the site.  DO  NOT apply any creams, oils, or ointments to the wound area.   If you notice any drainage or discharge from the wound, any swelling or bruising at the site, or you develop a fever > 101? F after you are discharged home, call the office at once.  Special Instructions   You are still able to use cellular telephones; use the ear opposite the side where you have your pacemaker/defibrillator.  Avoid carrying your cellular phone near your device.   When traveling through airports, show security personnel your identification card to avoid being screened in the metal detectors.  Ask the security personnel to use the hand wand.   Avoid arc welding equipment, MRI testing (magnetic resonance imaging), TENS units (transcutaneous nerve stimulators).  Call the office for questions about other devices.   Avoid electrical appliances that are in poor condition or are not properly grounded.   Microwave ovens are safe to be near or to operate.  Additional information for defibrillator patients should your device go off:   If your device goes off ONCE and you feel fine afterward, notify the device clinic nurses.   If your device goes off ONCE and you do not feel well afterward, call 911.   If your device goes off TWICE, call 911.   If your device goes off THREE times in one day, call 911.  DO NOT DRIVE YOURSELF OR A FAMILY MEMBER WITH A DEFIBRILLATOR TO THE HOSPITAL--CALL 911.

## 2014-02-13 ENCOUNTER — Ambulatory Visit: Payer: Medicare Other | Admitting: Cardiovascular Disease

## 2014-02-13 ENCOUNTER — Other Ambulatory Visit: Payer: Self-pay | Admitting: Oncology

## 2014-02-13 DIAGNOSIS — D72820 Lymphocytosis (symptomatic): Secondary | ICD-10-CM

## 2014-02-14 ENCOUNTER — Telehealth: Payer: Self-pay | Admitting: Oncology

## 2014-02-14 ENCOUNTER — Ambulatory Visit (HOSPITAL_BASED_OUTPATIENT_CLINIC_OR_DEPARTMENT_OTHER): Payer: Medicare Other | Admitting: Oncology

## 2014-02-14 ENCOUNTER — Encounter: Payer: Self-pay | Admitting: Oncology

## 2014-02-14 ENCOUNTER — Other Ambulatory Visit (HOSPITAL_BASED_OUTPATIENT_CLINIC_OR_DEPARTMENT_OTHER): Payer: Medicare Other

## 2014-02-14 ENCOUNTER — Ambulatory Visit: Payer: Medicare Other

## 2014-02-14 ENCOUNTER — Other Ambulatory Visit (HOSPITAL_COMMUNITY)
Admission: RE | Admit: 2014-02-14 | Discharge: 2014-02-14 | Disposition: A | Discharge status: Home / Self Care | Payer: Medicare Other | Source: Ambulatory Visit | Attending: Oncology | Admitting: Oncology

## 2014-02-14 VITALS — BP 129/67 | HR 78 | Temp 97.9°F | Resp 21 | Ht 70.0 in | Wt 233.6 lb

## 2014-02-14 DIAGNOSIS — C8589 Other specified types of non-Hodgkin lymphoma, extranodal and solid organ sites: Secondary | ICD-10-CM

## 2014-02-14 DIAGNOSIS — D72829 Elevated white blood cell count, unspecified: Secondary | ICD-10-CM

## 2014-02-14 DIAGNOSIS — D7289 Other specified disorders of white blood cells: Secondary | ICD-10-CM | POA: Diagnosis not present

## 2014-02-14 DIAGNOSIS — D72820 Lymphocytosis (symptomatic): Secondary | ICD-10-CM

## 2014-02-14 DIAGNOSIS — D7589 Other specified diseases of blood and blood-forming organs: Secondary | ICD-10-CM

## 2014-02-14 LAB — COMPREHENSIVE METABOLIC PANEL (CC13)
ALK PHOS: 46 U/L (ref 40–150)
ALT: 26 U/L (ref 0–55)
ANION GAP: 6 meq/L (ref 3–11)
AST: 33 U/L (ref 5–34)
Albumin: 3.7 g/dL (ref 3.5–5.0)
BILIRUBIN TOTAL: 0.64 mg/dL (ref 0.20–1.20)
BUN: 21.2 mg/dL (ref 7.0–26.0)
CO2: 25 mEq/L (ref 22–29)
Calcium: 9.1 mg/dL (ref 8.4–10.4)
Chloride: 107 mEq/L (ref 98–109)
Creatinine: 1.2 mg/dL (ref 0.7–1.3)
GLUCOSE: 122 mg/dL (ref 70–140)
Potassium: 4.9 mEq/L (ref 3.5–5.1)
SODIUM: 138 meq/L (ref 136–145)
Total Protein: 6.5 g/dL (ref 6.4–8.3)

## 2014-02-14 LAB — CBC WITH DIFFERENTIAL/PLATELET
BASO%: 0.9 % (ref 0.0–2.0)
Basophils Absolute: 0.3 10*3/uL — ABNORMAL HIGH (ref 0.0–0.1)
EOS%: 2.4 % (ref 0.0–7.0)
Eosinophils Absolute: 0.7 10*3/uL — ABNORMAL HIGH (ref 0.0–0.5)
HCT: 40.1 % (ref 38.4–49.9)
HGB: 13.1 g/dL (ref 13.0–17.1)
LYMPH%: 73.6 % — ABNORMAL HIGH (ref 14.0–49.0)
MCH: 34 pg — AB (ref 27.2–33.4)
MCHC: 32.5 g/dL (ref 32.0–36.0)
MCV: 104.6 fL — ABNORMAL HIGH (ref 79.3–98.0)
MONO#: 1.2 10*3/uL — ABNORMAL HIGH (ref 0.1–0.9)
MONO%: 4.1 % (ref 0.0–14.0)
NEUT#: 5.6 10*3/uL (ref 1.5–6.5)
NEUT%: 19 % — ABNORMAL LOW (ref 39.0–75.0)
Platelets: 243 10*3/uL (ref 140–400)
RBC: 3.84 10*6/uL — AB (ref 4.20–5.82)
RDW: 19.9 % — AB (ref 11.0–14.6)
WBC: 29.6 10*3/uL — ABNORMAL HIGH (ref 4.0–10.3)
lymph#: 21.8 10*3/uL — ABNORMAL HIGH (ref 0.9–3.3)

## 2014-02-14 LAB — TECHNOLOGIST REVIEW

## 2014-02-14 NOTE — Telephone Encounter (Signed)
gv and printed appt sched and av sfo rpt for OCT and NOV.... °

## 2014-02-14 NOTE — Progress Notes (Signed)
Please see consult note.  

## 2014-02-14 NOTE — Consult Note (Signed)
Reason for Referral: Lymphocytosis.   HPI: Oscar Baker is a pleasant 80 year old gentleman currently of [place] where he lives at the majority of his life. He has a history of hypertension and diabetes as well as history of sick sinus syndrome with a pacemaker placed recently. He was noted over that period of time they have developed a leukocytosis and more specifically a lymphocytosis. His CBC checked by Dr. Virgina Jock on 01/30/2014 showed a white cell count of 25,000. His lymphocyte percentage was elevated at 67.1. His neutrophil percentage was 23% which was low. His hemoglobin was 13.7 with an MCV of 106 platelet count of 325. On 02/08/2014 his white blood count is 31.8 and repeated was 28.3 on 02/09/2014. He was referred to me for evaluation regarding this issue. He was also noted to have macrocytosis with an MCV ranging between 99 and 106. Clinically, he is asymptomatic from this. He has not reported any fevers, chills, sweats, weight loss or any constitutional symptoms. He did not report any lymphadenopathy, abdominal pain or early satiety. He continues to be fully functional at this time. He does not report any headaches or blurry vision or syncope. He does not report any chest pain, palpitation or leg edema. He does not report any shortness of breath, wheezing or cough. He does not report any nausea, vomiting, hemoptysis or hematemesis. He does not report any frequency, urgency hematuria. He does not report any lymphadenopathy, petechiae or rash. Rest of his review of systems unremarkable.   Past Medical History  Diagnosis Date  . Diabetes mellitus type 2, noninsulin dependent   . Hyperlipidemia   . Hypertension   . Sick sinus syndrome     a-Flutter with episodes of bradycardia; S/P Biotronik (serial number [id number])  . Pacemaker 02/08/2014    DR Lovena Le  :  Past Surgical History  Procedure Laterality Date  . Back surgery      disk  . Appendectomy    . Tonsillectomy    . Total knee  arthroplasty    . Pacemaker insertion  02/08/2014    Biotronik (serial number [id number])  . Insert / replace / remove pacemaker  02/08/2014     DR Lovena Le  :  Current Outpatient Prescriptions  Medication Sig Dispense Refill  . amLODipine (NORVASC) 5 MG tablet Take 1 tablet (5 mg total) by mouth daily.  90 tablet  3  . aspirin 81 MG tablet Take 81 mg by mouth daily.        [name] atorvastatin (LIPITOR) 10 MG tablet Take 20 mg by mouth daily.      [name] Calcium (STOOL SOFTENER PO) Take 1 capsule by mouth daily.       . fenofibrate micronized (LOFIBRA) 200 MG capsule Take 1 capsule (200 mg total) by mouth daily before breakfast.  90 capsule  3  . fexofenadine (ALLEGRA) 180 MG tablet Take 180 mg by mouth daily.      . fish oil-omega-3 fatty acids 1000 MG capsule Take 2 g by mouth daily.        . metFORMIN (GLUCOPHAGE) 1000 MG tablet Take 1,000 mg by mouth 2 (two) times daily with a meal.        . multivitamin (THERAGRAN) per tablet Take 1 tablet by mouth daily.        . ramipril (ALTACE) 10 MG capsule Take 10 mg by mouth daily.       . sitaGLIPtan (JANUVIA) 100 MG tablet Take 50 mg by mouth every other day.       [name]  vitamin B-12 (CYANOCOBALAMIN) 1000 MCG tablet Take 2,000 mcg by mouth daily.       [name] warfarin (COUMADIN) 5 MG tablet Take 5 mg by mouth daily.       No current facility-administered medications for this visit.       Allergies  Allergen Reactions  . Codeine Nausea And Vomiting  . Simvastatin Other (See Comments)    Leg aches  :  Family History  Problem Relation Age of Onset  . Emphysema Mother   . Heart attack Father   :  History   Social History  . Marital Status: Married    Spouse Name: N/A    Number of Children: N/A  . Years of Education: N/A   Occupational History  . Not on file.   Social History Main Topics  . Smoking status: Former Smoker    Types: Pipe    Quit date: 11/25/1975  . Smokeless tobacco: Never Used  . Alcohol Use: No  . Drug Use: No  .  Sexual Activity: Not on file   Other Topics Concern  . Not on file   Social History Narrative  . No narrative on file  :  Pertinent items are noted in HPI.  Exam: Blood pressure 129/67, pulse 78, temperature 97.9 F (36.6 C), temperature source Oral, resp. rate 21, height _0  (1.778 m), weight 233 lb 9.6 oz (105.96 kg), SpO2 99.00%. General appearance: alert and cooperative Head: Normocephalic, without obvious abnormality Throat: lips, mucosa, and tongue normal; teeth and gums normal Neck: no adenopathy Back: symmetric, no curvature. ROM normal. No CVA tenderness. Resp: clear to auscultation bilaterally Chest wall: no tenderness Cardio: regular rate and rhythm, S1, S2 normal, no murmur, click, rub or gallop GI: soft, non-tender; bowel sounds normal; no masses,  no organomegaly Extremities: extremities normal, atraumatic, no cyanosis or edema Pulses: 2+ and symmetric Skin: Skin color, texture, turgor normal. No rashes or lesions Lymph nodes: Cervical, supraclavicular, and axillary nodes normal.   Recent Labs  02/14/14 1035  WBC 29.6*  HGB 13.1  HCT 40.1  PLT 243    Recent Labs  02/14/14 1035  NA 138  K 4.9  CO2 25  GLUCOSE 122  BUN 21.2  CREATININE 1.2  CALCIUM 9.1     Blood smear review: Lymphocytosis with a lymphocytes noted on his smear.     Assessment and Plan:   80 year old gentleman with the following issues:  1. Lymphocytosis with leukocytosis: The differential diagnosis discussed today with the patient and his wife. This most likely represents lymphoproliferative disorder. The most likely condition would be CLL although other lymphomas can be considered. Mantle cell lymphoma among other non-Hodgkin's lymphoma can manifest itself as a leukemic phase and lymphocytosis. The pattern of his white cell increase is not suggest acute process at this point. He is completely asymptomatic without any constitutional symptoms.  To work this up, I will send  his blood for flow cytometry to document the diagnosis. He will also need a CT scan for staging purposes to look for lymphadenopathy and splenomegaly. A bone marrow biopsy could be required depending on his lymphoproliferative disorder diagnosis.  On the management standpoint, this most likely represents a chronic slow growing process that might not require immediate treatment. Observation and surveillance would be the likely approach unless the diagnosis indicates an aggressive form of lymphoma or his CT scan imaging suggests bulky adenopathy with organ compromise.  I will set him up with a followup after his CT scan to make  my final recommendation based on accumulated data.  2. Macrocytosis: This could be related to his lymphoproliferative disorder or he could have a plasma cell disorder as well. I will obtain a serum protein electrophoresis to complete the workup as well. I doubt he has multiple myeloma or myelodysplastic syndrome at this point. We will certainly continue to follow this as well.  All his questions were answered today to his satisfaction.

## 2014-02-14 NOTE — Progress Notes (Signed)
Checked in new patient with no financial issues prior to seeing the dr. He has appt card and has not been out of the country. °

## 2014-02-16 LAB — FLOW CYTOMETRY

## 2014-02-21 ENCOUNTER — Ambulatory Visit (INDEPENDENT_AMBULATORY_CARE_PROVIDER_SITE_OTHER): Payer: Medicare Other | Admitting: *Deleted

## 2014-02-21 DIAGNOSIS — R001 Bradycardia, unspecified: Secondary | ICD-10-CM

## 2014-02-21 DIAGNOSIS — I498 Other specified cardiac arrhythmias: Secondary | ICD-10-CM | POA: Diagnosis not present

## 2014-02-21 LAB — MDC_IDC_ENUM_SESS_TYPE_INCLINIC
Battery Remaining Longevity: 113 mo
Brady Statistic RV Percent Paced: 98 %
Date Time Interrogation Session: 20150902112522
Implantable Pulse Generator Model: 394971
Implantable Pulse Generator Serial Number: 68372291
Lead Channel Pacing Threshold Amplitude: 0.6 V
Lead Channel Pacing Threshold Pulse Width: 0.4 ms
Lead Channel Pacing Threshold Pulse Width: 0.4 ms
Lead Channel Sensing Intrinsic Amplitude: 8.8 mV
Lead Channel Setting Pacing Amplitude: 3 V
Lead Channel Setting Pacing Pulse Width: 0.4 ms
MDC IDC MSMT LEADCHNL RV IMPEDANCE VALUE: 604 Ohm
MDC IDC MSMT LEADCHNL RV PACING THRESHOLD AMPLITUDE: 0.6 V
MDC IDC MSMT LEADCHNL RV SENSING INTR AMPL: 3.5 mV

## 2014-02-21 NOTE — Progress Notes (Signed)
Wound check appointment. Steri-strips removed. Wound without redness or edema. Incision edges approximated, wound well healed. Normal device function. Thresholds, sensing, and impedances consistent with implant measurements.  R-waves 3.1-3.63mV down from 5.7-6.70mV post implant, Dr. Lovena Le aware.  No CXR @ this time.   Device programmed at 3.5V/auto capture programmed on for extra safety margin until 3 month visit. Histogram distribution appropriate for patient and level of activity. No high ventricular rates noted. Patient educated about wound care, arm mobility, lifting restrictions. ROV in 3 months with implanting physician.  Checked by [employer].

## 2014-03-09 ENCOUNTER — Encounter: Payer: Self-pay | Admitting: Internal Medicine

## 2014-03-13 ENCOUNTER — Ambulatory Visit (INDEPENDENT_AMBULATORY_CARE_PROVIDER_SITE_OTHER): Payer: Medicare Other

## 2014-03-13 DIAGNOSIS — I4892 Unspecified atrial flutter: Secondary | ICD-10-CM | POA: Diagnosis not present

## 2014-03-13 DIAGNOSIS — I4891 Unspecified atrial fibrillation: Secondary | ICD-10-CM

## 2014-03-13 LAB — POCT INR: INR: 2

## 2014-03-20 ENCOUNTER — Other Ambulatory Visit: Payer: Self-pay | Admitting: Pharmacist

## 2014-03-20 MED ORDER — WARFARIN SODIUM 5 MG PO TABS: 5.0000 mg | ORAL_TABLET | Freq: Every day | ORAL | Status: DC

## 2014-04-17 ENCOUNTER — Ambulatory Visit (HOSPITAL_COMMUNITY)
Admission: RE | Admit: 2014-04-17 | Discharge: 2014-04-17 | Disposition: A | Discharge status: Home / Self Care | Payer: Medicare Other | Source: Ambulatory Visit | Attending: Oncology | Admitting: Oncology

## 2014-04-17 ENCOUNTER — Encounter (HOSPITAL_COMMUNITY): Payer: Self-pay

## 2014-04-17 ENCOUNTER — Other Ambulatory Visit (HOSPITAL_BASED_OUTPATIENT_CLINIC_OR_DEPARTMENT_OTHER): Payer: Medicare Other

## 2014-04-17 DIAGNOSIS — D72829 Elevated white blood cell count, unspecified: Secondary | ICD-10-CM

## 2014-04-17 DIAGNOSIS — D72820 Lymphocytosis (symptomatic): Secondary | ICD-10-CM

## 2014-04-17 DIAGNOSIS — C8599 Non-Hodgkin lymphoma, unspecified, extranodal and solid organ sites: Secondary | ICD-10-CM | POA: Insufficient documentation

## 2014-04-17 DIAGNOSIS — R590 Localized enlarged lymph nodes: Secondary | ICD-10-CM | POA: Diagnosis not present

## 2014-04-17 LAB — CBC WITH DIFFERENTIAL/PLATELET
BASO%: 0.6 % (ref 0.0–2.0)
Basophils Absolute: 0.2 10*3/uL — ABNORMAL HIGH (ref 0.0–0.1)
EOS%: 1.7 % (ref 0.0–7.0)
Eosinophils Absolute: 0.7 10*3/uL — ABNORMAL HIGH (ref 0.0–0.5)
HCT: 41.4 % (ref 38.4–49.9)
HEMOGLOBIN: 13.4 g/dL (ref 13.0–17.1)
LYMPH%: 62.4 % — AB (ref 14.0–49.0)
MCH: 33.6 pg — ABNORMAL HIGH (ref 27.2–33.4)
MCHC: 32.4 g/dL (ref 32.0–36.0)
MCV: 103.9 fL — ABNORMAL HIGH (ref 79.3–98.0)
MONO#: 1.5 10*3/uL — ABNORMAL HIGH (ref 0.1–0.9)
MONO%: 3.8 % (ref 0.0–14.0)
NEUT%: 31.5 % — ABNORMAL LOW (ref 39.0–75.0)
NEUTROS ABS: 12.6 10*3/uL — AB (ref 1.5–6.5)
PLATELETS: 336 10*3/uL (ref 140–400)
RBC: 3.99 10*6/uL — AB (ref 4.20–5.82)
RDW: 19.2 % — ABNORMAL HIGH (ref 11.0–14.6)
WBC: 39.9 10*3/uL — AB (ref 4.0–10.3)
lymph#: 24.9 10*3/uL — ABNORMAL HIGH (ref 0.9–3.3)

## 2014-04-17 LAB — COMPREHENSIVE METABOLIC PANEL (CC13)
ALBUMIN: 3.6 g/dL (ref 3.5–5.0)
ALK PHOS: 57 U/L (ref 40–150)
ALT: 19 U/L (ref 0–55)
AST: 23 U/L (ref 5–34)
Anion Gap: 9 mEq/L (ref 3–11)
BILIRUBIN TOTAL: 0.8 mg/dL (ref 0.20–1.20)
BUN: 15.8 mg/dL (ref 7.0–26.0)
CO2: 23 mEq/L (ref 22–29)
Calcium: 9.5 mg/dL (ref 8.4–10.4)
Chloride: 108 mEq/L (ref 98–109)
Creatinine: 1.1 mg/dL (ref 0.7–1.3)
Glucose: 168 mg/dl — ABNORMAL HIGH (ref 70–140)
Potassium: 4.7 mEq/L (ref 3.5–5.1)
SODIUM: 140 meq/L (ref 136–145)
Total Protein: 6.9 g/dL (ref 6.4–8.3)

## 2014-04-17 LAB — TECHNOLOGIST REVIEW

## 2014-04-17 MED ORDER — IOHEXOL 300 MG/ML  SOLN
100.0000 mL | Freq: Once | INTRAMUSCULAR | Status: AC | PRN
  Administered 2014-04-17: 100 mL via INTRAVENOUS

## 2014-04-20 ENCOUNTER — Ambulatory Visit: Payer: Medicare Other | Admitting: Oncology

## 2014-04-24 ENCOUNTER — Ambulatory Visit (HOSPITAL_BASED_OUTPATIENT_CLINIC_OR_DEPARTMENT_OTHER): Payer: Medicare Other | Admitting: Oncology

## 2014-04-24 ENCOUNTER — Telehealth: Payer: Self-pay | Admitting: Oncology

## 2014-04-24 ENCOUNTER — Other Ambulatory Visit: Payer: Self-pay | Admitting: Oncology

## 2014-04-24 VITALS — BP 157/67 | HR 73 | Resp 20 | Ht 70.0 in | Wt 229.8 lb

## 2014-04-24 DIAGNOSIS — C9110 Chronic lymphocytic leukemia of B-cell type not having achieved remission: Secondary | ICD-10-CM | POA: Diagnosis not present

## 2014-04-24 NOTE — Telephone Encounter (Signed)
gv and printed appt sched and avs for pt for May 2016 °

## 2014-04-24 NOTE — Progress Notes (Signed)
Hematology and Oncology Follow Up Visit  Oscar Baker [id number] 02/12/1933 80 y.o. 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 He presented with leukocytosis and mild lymphadenopathy.  Current therapy: observation and surveillance:  Interim History:  Oscar Baker presents today for a follow-up visit. Since the last visit, he continues to be asymptomatic. He does not report any constitutional symptoms of fevers or chills or sweats. He does not report any painful adenopathy or change in his performance status. He does not report any headaches or blurry vision or syncope. He does not report any chest pain, palpitation or shortness of breath. He does not report any nausea, vomiting, abdominal pain. He does not report any frequency urgency or hesitancy. He does not report any skeletal complaints. Rest of his review of systems unremarkable.  Medications: I have reviewed the patient's current medications.  Current Outpatient Prescriptions  Medication Sig Dispense Refill  . amLODipine (NORVASC) 5 MG tablet Take 1 tablet (5 mg total) by mouth daily. 90 tablet 3  . aspirin 81 MG tablet Take 81 mg by mouth daily.      [name] atorvastatin (LIPITOR) 20 MG tablet Take 20 mg by mouth at bedtime.  2  . Docusate Calcium (STOOL SOFTENER PO) Take 1 capsule by mouth daily.     . fenofibrate micronized (LOFIBRA) 200 MG capsule Take 1 capsule (200 mg total) by mouth daily before breakfast. 90 capsule 3  . fexofenadine (ALLEGRA) 180 MG tablet Take 180 mg by mouth daily.    . fish oil-omega-3 fatty acids 1000 MG capsule Take 2 g by mouth daily.      . metFORMIN (GLUCOPHAGE) 1000 MG tablet Take 1,000 mg by mouth 2 (two) times daily with a meal.      . multivitamin (THERAGRAN) per tablet Take 1 tablet by mouth daily.      . sitaGLIPtan (JANUVIA) 100 MG tablet Take 50 mg by mouth every other day.     . vitamin B-12 (CYANOCOBALAMIN)  1000 MCG tablet Take 2,000 mcg by mouth daily.     [name] warfarin (COUMADIN) 5 MG tablet Take 1 tablet (5 mg total) by mouth daily. 90 tablet 1   No current facility-administered medications for this visit.     Allergies:  Allergies  Allergen Reactions  . Codeine Nausea And Vomiting  . Simvastatin Other (See Comments)    Leg aches    Past Medical History, Surgical history, Social history, and Family History were reviewed and updated.   Physical Exam: Blood pressure 157/67, pulse 73, temperature 0 F (-17.8 C), resp. rate 20, height 5\' 10"  (1.778 m), weight 229 lb 12.8 oz (104.237 kg). ECOG: 0 General appearance: alert and cooperative Head: Normocephalic, without obvious abnormality Neck: no adenopathy Lymph nodes: Cervical, supraclavicular, and axillary nodes normal. Heart:regular rate and rhythm, S1, S2 normal, no murmur, click, rub or gallop Lung:chest clear, no wheezing, rales, normal symmetric air entry.  Abdomin: soft, non-tender, without masses or organomegaly EXT:no erythema, induration, or nodules   Lab Results: Lab Results  Component Value Date   WBC 39.9* 04/17/2014   HGB 13.4 04/17/2014   HCT 41.4 04/17/2014   MCV 103.9* 04/17/2014   PLT 336 04/17/2014     Chemistry      Component Value Date/Time   NA 140 04/17/2014 0817   NA 140 02/08/2014 0700   K 4.7 04/17/2014 0817   K 4.7 02/08/2014 0700   CL  106 02/08/2014 0700   CO2 23 04/17/2014 0817   CO2 23 02/08/2014 0700   BUN 15.8 04/17/2014 0817   BUN 19 02/08/2014 0700   CREATININE 1.1 04/17/2014 0817   CREATININE 1.16 02/08/2014 0700      Component Value Date/Time   CALCIUM 9.5 04/17/2014 0817   CALCIUM 9.5 02/08/2014 0700   ALKPHOS 57 04/17/2014 0817   ALKPHOS 35* 09/06/2012 0742   AST 23 04/17/2014 0817   AST 26 09/06/2012 0742   ALT 19 04/17/2014 0817   ALT 22 09/06/2012 0742   BILITOT 0.80 04/17/2014 0817   BILITOT 0.7 09/06/2012 0742       Radiological Studies:  EXAM: CT CHEST,  ABDOMEN, AND PELVIS WITH CONTRAST  TECHNIQUE: Multidetector CT imaging of the chest, abdomen and pelvis was performed following the standard protocol during bolus administration of intravenous contrast.  CONTRAST: 147mL OMNIPAQUE IOHEXOL 300 MG/ML SOLN  COMPARISON: Chest radiograph 02/09/2014  FINDINGS: CT CHEST FINDINGS  There are multiple prominent axial lymph nodes which measure less than 10 mm short axis. These lymph nodes have normal morphology. There is 11 mm supraclavicular lymph node on the left (Image number 7 series 2). Small paratracheal lymph nodes are noted less than 10 mm. Subcarinal lymph node measures 12 mm short axis. No axillary lymphadenopathy. No pericardial fluid. Esophagus normal.  Review of the lung parenchyma demonstrates no suspicious nodularity.  CT ABDOMEN AND PELVIS FINDINGS  Hepatobiliary: No focal hepatic lesion. The gallbladder is normal.  Pancreas: No filling defects within the pulmonary arteries to suggest acute pulmonary embolism. No acute findings of the aorta or great vessels. No pericardial fluid. Spleen is normal volume.  Spleen: Spleen is normal volume.  Adrenals/Urinary Tract: Adrenal glands and kidneys are normal. Ureters and bladder normal.  Stomach/Bowel: Stomach, small bowel and colon are normal.  Vascular/Lymphatic: Abdominal aorta normal caliber. There is intimal calcification. Mild periportal lymphadenopathy. For example 21 mm short axis lymph node deep to the portal vein on image 68, series 2. Small periaortic lymph nodes are noted. 8 mm lymph node position between IVC and aorta at the level of the kidneys.  Right common iliac lymph node measuring 14 mm short axis on image 9 mm. No inguinal lymphadenopathy.  Reproductive: Post prostatectomy. Penile implant noted.  Other: No peritoneal disease.  Musculoskeletal: No aggressive osseous lesion.  IMPRESSION: Chest Impression:  1. Multiple small  axillary lymph nodes. 2. Small mediastinal lymph nodes. 3. Single mildly enlarged left supra / infraclavicular node.  Abdomen / Pelvis Impression:  1. Mildly enlarged periportal lymph nodes and periaortic lymph nodes. 2. Normal spleen. 3. Findings suggestive of chronic lymphocytic leukemia or less likely low-grade lymphoma.  Impression and Plan:  80 year old gentleman with lymphocytosis and leukocytosis with flow cytometry confirming the presence of CLL. His CT scan results on 04/17/2014 were discussed today. He has very small lymphadenopathy and no indication for treatment. The natural course of this disease was discussed today as well as indications for treatment. He currently has no indications to start treatment at this time. He does not report any painful or bulky adenopathy. He does not report any constitutional symptoms. The plan is to continue with active surveillance and repeat laboratory testing and clinical visit in 6 months. Sooner if he develops any symptoms. All his questions were answered today.   [facility], MD 11/3/20158:22 AM

## 2014-04-25 ENCOUNTER — Ambulatory Visit (INDEPENDENT_AMBULATORY_CARE_PROVIDER_SITE_OTHER): Payer: Medicare Other | Admitting: *Deleted

## 2014-04-25 DIAGNOSIS — I4892 Unspecified atrial flutter: Secondary | ICD-10-CM

## 2014-04-25 DIAGNOSIS — I4891 Unspecified atrial fibrillation: Secondary | ICD-10-CM

## 2014-04-25 LAB — POCT INR: INR: 2.8

## 2014-04-26 ENCOUNTER — Other Ambulatory Visit: Payer: Self-pay | Admitting: *Deleted

## 2014-04-26 MED ORDER — FENOFIBRATE MICRONIZED 200 MG PO CAPS: 200.0000 mg | ORAL_CAPSULE | Freq: Every day | ORAL | Status: DC

## 2014-04-27 DIAGNOSIS — Z7901 Long term (current) use of anticoagulants: Secondary | ICD-10-CM | POA: Diagnosis not present

## 2014-04-27 DIAGNOSIS — I495 Sick sinus syndrome: Secondary | ICD-10-CM | POA: Diagnosis not present

## 2014-04-27 DIAGNOSIS — E119 Type 2 diabetes mellitus without complications: Secondary | ICD-10-CM | POA: Diagnosis not present

## 2014-04-27 DIAGNOSIS — Z6832 Body mass index (BMI) 32.0-32.9, adult: Secondary | ICD-10-CM | POA: Diagnosis not present

## 2014-04-27 DIAGNOSIS — C9100 Acute lymphoblastic leukemia not having achieved remission: Secondary | ICD-10-CM | POA: Diagnosis not present

## 2014-04-27 DIAGNOSIS — Z950 Presence of cardiac pacemaker: Secondary | ICD-10-CM | POA: Diagnosis not present

## 2014-04-27 DIAGNOSIS — I10 Essential (primary) hypertension: Secondary | ICD-10-CM | POA: Diagnosis not present

## 2014-04-27 DIAGNOSIS — R05 Cough: Secondary | ICD-10-CM | POA: Diagnosis not present

## 2014-05-15 ENCOUNTER — Encounter: Payer: Self-pay | Admitting: Internal Medicine

## 2014-05-15 ENCOUNTER — Ambulatory Visit (INDEPENDENT_AMBULATORY_CARE_PROVIDER_SITE_OTHER): Payer: Medicare Other | Admitting: Internal Medicine

## 2014-05-15 VITALS — BP 124/50 | HR 67 | Ht 72.0 in | Wt 232.4 lb

## 2014-05-15 DIAGNOSIS — I482 Chronic atrial fibrillation, unspecified: Secondary | ICD-10-CM

## 2014-05-15 DIAGNOSIS — R001 Bradycardia, unspecified: Secondary | ICD-10-CM

## 2014-05-15 DIAGNOSIS — R05 Cough: Secondary | ICD-10-CM | POA: Diagnosis not present

## 2014-05-15 DIAGNOSIS — I10 Essential (primary) hypertension: Secondary | ICD-10-CM

## 2014-05-15 DIAGNOSIS — Z950 Presence of cardiac pacemaker: Secondary | ICD-10-CM | POA: Diagnosis not present

## 2014-05-15 LAB — MDC_IDC_ENUM_SESS_TYPE_INCLINIC
Date Time Interrogation Session: 20151124104721
Implantable Pulse Generator Model: 394971
Lead Channel Impedance Value: 585 Ohm
Lead Channel Pacing Threshold Amplitude: 0.7 V
Lead Channel Pacing Threshold Amplitude: 0.8 V
Lead Channel Pacing Threshold Amplitude: 0.8 V
Lead Channel Pacing Threshold Pulse Width: 0.4 ms
Lead Channel Pacing Threshold Pulse Width: 0.4 ms
Lead Channel Sensing Intrinsic Amplitude: 8 mV
MDC IDC MSMT LEADCHNL RV PACING THRESHOLD PULSEWIDTH: 0.4 ms
MDC IDC MSMT LEADCHNL RV SENSING INTR AMPL: 6.6 mV
MDC IDC PG SERIAL: 68372291
MDC IDC SET LEADCHNL RV PACING AMPLITUDE: 2.4 V
MDC IDC SET LEADCHNL RV PACING PULSEWIDTH: 0.4 ms
MDC IDC STAT BRADY RV PERCENT PACED: 93 %

## 2014-05-15 NOTE — Assessment & Plan Note (Signed)
His ventricular rate is controlled. No change in medications.  

## 2014-05-15 NOTE — Assessment & Plan Note (Signed)
HIs blood pressure is normal today. No change in meds.

## 2014-05-15 NOTE — Progress Notes (Signed)
HPI [name] returns today for ongoing followup of bradycardia in the setting of atrial fibrillation, s/p PPM incision. Since his implant he has been stable. He denies syncope. He has had a cough, thought due to an ACE inhibitor. He also has had nasal drainage.  Allergies  Allergen Reactions  . Codeine Nausea And Vomiting    Nausea and vomiting   . Simvastatin Other (See Comments)    Leg aches     Current Outpatient Prescriptions  Medication Sig Dispense Refill  . amLODipine (NORVASC) 10 MG tablet Take 10 mg by mouth daily.  2  . aspirin 81 MG tablet Take 81 mg by mouth daily.      [name] atorvastatin (LIPITOR) 20 MG tablet Take 20 mg by mouth at bedtime.  2  . Docusate Calcium (STOOL SOFTENER PO) Take 1 capsule by mouth as needed (constipation).     . fenofibrate micronized (LOFIBRA) 200 MG capsule Take 1 capsule (200 mg total) by mouth daily before breakfast. 90 capsule 3  . fexofenadine (ALLEGRA) 180 MG tablet Take 180 mg by mouth as needed for allergies or rhinitis (hayfever).     . fish oil-omega-3 fatty acids 1000 MG capsule Take 2 g by mouth daily.      [name] glipiZIDE (GLUCOTROL XL) 5 MG 24 hr tablet Take 5 mg by mouth daily with breakfast.    . metFORMIN (GLUCOPHAGE) 1000 MG tablet Take 1,000 mg by mouth 2 (two) times daily with a meal.      . multivitamin (THERAGRAN) per tablet Take 1 tablet by mouth daily.      . sitaGLIPtan (JANUVIA) 100 MG tablet Take 50 mg by mouth daily.     . vitamin B-12 (CYANOCOBALAMIN) 1000 MCG tablet Take 2,000 mcg by mouth daily.     [name] warfarin (COUMADIN) 5 MG tablet Take 1 tablet (5 mg total) by mouth daily. 90 tablet 1   No current facility-administered medications for this visit.     Past Medical History  Diagnosis Date  . Diabetes mellitus type 2, noninsulin dependent   . Hyperlipidemia   . Hypertension   . Sick sinus syndrome     a-Flutter with episodes of bradycardia; S/P Biotronik (serial number [id number])  . Pacemaker 02/08/2014    DR Lovena Le  . prostate ca dx'd 2006    xrt; prostatectomy    ROS:   All systems reviewed and negative except as noted in the HPI.   Past Surgical History  Procedure Laterality Date  . Back surgery      disk  . Appendectomy    . Tonsillectomy    . Total knee arthroplasty    . Pacemaker insertion  02/08/2014    Biotronik (serial number [id number])  . Insert / replace / remove pacemaker  02/08/2014     DR Lovena Le     Family History  Problem Relation Age of Onset  . Emphysema Mother   . Heart attack Father      History   Social History  . Marital Status: Married    Spouse Name: N/A    Number of Children: N/A  . Years of Education: N/A   Occupational History  . Not on file.   Social History Main Topics  . Smoking status: Former Smoker    Types: Pipe    Quit date: 11/25/1975  . Smokeless tobacco: Never Used  . Alcohol Use: No  . Drug Use: No  . Sexual Activity: Not on file  Other Topics Concern  . Not on file   Social History Narrative     BP 124/50 mmHg  Pulse 67  Ht 6' (1.829 m)  Wt 232 lb 6.4 oz (105.416 kg)  BMI 31.51 kg/m2  Physical Exam:  Well appearing 80 yo man, NAD HEENT: Unremarkable Neck:  No JVD, no thyromegally Back:  No CVA tenderness Lungs:  Clear with no wheezes HEART:  IRegular brady rhythm, no murmurs, no rubs, no clicks Abd:  soft, positive bowel sounds, no organomegally, no rebound, no guarding Ext:  2 plus pulses, no edema, no cyanosis, no clubbing Skin:  No rashes no nodules Neuro:  CN II through XII intact, motor grossly intact  PPM - undersensing noted and the device has been reprogrammed using unipolar sensing.   Assess/Plan:

## 2014-05-15 NOTE — Assessment & Plan Note (Signed)
Interogation demonstrated ventricular undersensing in the bipolar mode but R waves of 7 unipolar. He has been reprogrammed to sense unipolar.

## 2014-05-15 NOTE — Patient Instructions (Signed)
Your physician wants you to follow-up in: 9 months with Dr. Taylor You will receive a reminder letter in the mail two months in advance. If you don't receive a letter, please call our office to schedule the follow-up appointment.  Remote monitoring is used to monitor your Pacemaker or ICD from home. This monitoring reduces the number of office visits required to check your device to one time per year. It allows us to keep an eye on the functioning of your device to ensure it is working properly. You are scheduled for a device check from home on 08/14/14. You may send your transmission at any time that day. If you have a wireless device, the transmission will be sent automatically. After your physician reviews your transmission, you will receive a postcard with your next transmission date.    

## 2014-05-31 ENCOUNTER — Encounter (HOSPITAL_COMMUNITY): Payer: Self-pay | Admitting: Internal Medicine

## 2014-06-05 ENCOUNTER — Ambulatory Visit (INDEPENDENT_AMBULATORY_CARE_PROVIDER_SITE_OTHER): Payer: Medicare Other

## 2014-06-05 DIAGNOSIS — I4891 Unspecified atrial fibrillation: Secondary | ICD-10-CM

## 2014-06-05 DIAGNOSIS — I4892 Unspecified atrial flutter: Secondary | ICD-10-CM | POA: Diagnosis not present

## 2014-06-05 LAB — POCT INR: INR: 2.2

## 2014-06-06 DIAGNOSIS — J370 Chronic laryngitis: Secondary | ICD-10-CM | POA: Diagnosis not present

## 2014-06-06 DIAGNOSIS — J320 Chronic maxillary sinusitis: Secondary | ICD-10-CM | POA: Diagnosis not present

## 2014-06-06 DIAGNOSIS — J410 Simple chronic bronchitis: Secondary | ICD-10-CM | POA: Diagnosis not present

## 2014-06-06 DIAGNOSIS — J322 Chronic ethmoidal sinusitis: Secondary | ICD-10-CM | POA: Diagnosis not present

## 2014-06-06 DIAGNOSIS — H6122 Impacted cerumen, left ear: Secondary | ICD-10-CM | POA: Diagnosis not present

## 2014-06-19 DIAGNOSIS — J320 Chronic maxillary sinusitis: Secondary | ICD-10-CM | POA: Diagnosis not present

## 2014-06-19 DIAGNOSIS — J410 Simple chronic bronchitis: Secondary | ICD-10-CM | POA: Diagnosis not present

## 2014-06-19 DIAGNOSIS — J040 Acute laryngitis: Secondary | ICD-10-CM | POA: Diagnosis not present

## 2014-06-19 DIAGNOSIS — J322 Chronic ethmoidal sinusitis: Secondary | ICD-10-CM | POA: Diagnosis not present

## 2014-06-20 ENCOUNTER — Ambulatory Visit (INDEPENDENT_AMBULATORY_CARE_PROVIDER_SITE_OTHER): Payer: Medicare Other | Admitting: Cardiovascular Disease

## 2014-06-20 ENCOUNTER — Encounter: Payer: Self-pay | Admitting: Cardiovascular Disease

## 2014-06-20 VITALS — BP 124/62 | HR 74 | Ht 70.5 in | Wt 226.0 lb

## 2014-06-20 DIAGNOSIS — R062 Wheezing: Secondary | ICD-10-CM | POA: Diagnosis not present

## 2014-06-20 DIAGNOSIS — E785 Hyperlipidemia, unspecified: Secondary | ICD-10-CM

## 2014-06-20 DIAGNOSIS — R001 Bradycardia, unspecified: Secondary | ICD-10-CM

## 2014-06-20 DIAGNOSIS — I10 Essential (primary) hypertension: Secondary | ICD-10-CM | POA: Diagnosis not present

## 2014-06-20 MED ORDER — ALBUTEROL SULFATE HFA 108 (90 BASE) MCG/ACT IN AERS: INHALATION_SPRAY | RESPIRATORY_TRACT | Status: DC

## 2014-06-20 NOTE — Assessment & Plan Note (Signed)
Patient has had wheezing for the past several months Will start him in Albuterol HFA 2 puffs Q 6 hours for 5 days and then PRN  He has already had abx.  Does not need any more abx

## 2014-06-20 NOTE — Assessment & Plan Note (Signed)
He is s/p pacer , No syncope Doing great  Will follow up with Dr. Lovena Le

## 2014-06-20 NOTE — Assessment & Plan Note (Signed)
BP is well controlled 

## 2014-06-20 NOTE — Assessment & Plan Note (Signed)
Will check lipids at his next office visit

## 2014-06-20 NOTE — Patient Instructions (Addendum)
Your physician has recommended you make the following change in your medication:  START Albuterol inhaler - 2 puffs every 6 hours x 5 days then 2 puffs every 6 hours as needed  Your physician wants you to follow-up in: 1 year with Dr. Acie Fredrickson.  You will receive a reminder letter in the mail two months in advance. If you don't receive a letter, please call our office to schedule the follow-up appointment. Your physician recommends that you return for lab work in: 1 year on the day of or a few days before your office visit with Dr. Acie Fredrickson.  You will need to FAST for this appointment - nothing to eat or drink after midnight the night before except water.

## 2014-06-20 NOTE — Progress Notes (Signed)
[place] Date of Birth  08-Jan-1933 [place] [address]    [place]   [place], [place]  [address]    [place], [place]  [address] [phone]  Fax  [phone]  [phone]  Fax [phone]  Problem list: 1. Sick sinus syndrome with episodes of atrial flutter, frequent episodes of bradycardia - s/p pacer Aug. 2015.  2. Hypertension 3. Dyslipidemia 4. Diabetes mellitus 5. Arthroscopic knee surgery  History of Present Illness:  Oscar Baker is doing well.  He has asymptomatic bradycardia that we have followed since July 2000.  He has never had any syncope.  He had left knee surgery a year ago and right knee surgery in Dec. 2012.  No cardiac complaints.    He has done well since I last saw him.  He has bad knees and has not been walking much - he tries to avoid salt as much as possible.  He denies any chest pain, shortness breath, or syncope.  July 13, 2012 Oscar Baker is doing well.  No syncope or presyncope.  [name]   January 16, 2013:  Oscar Baker is doing well.  His HR continues to be slow.  He is asymptomatic - no syncope or presyncope.  Feb. 24, 2015:  Oscar Baker is doing well.  No syncope. No pre-syncope.  Getting his coumadin checked monthly.  No cp or dyspnea.  Had a URI this winter. We walked around the office and he increased his HR to 63.  He is able to get his heart rate up into the 70s when he is working out at [employer].  December 19, 2013:  Oscar Baker is here as a work in for fatigue and slow HR.    He has not had any episodes of syncope.   No chest pain , no dyspnea .  Dec. 30, 2015:  Oscar Baker is a 80 yo with hx of bradycardia and pacer placement, HTN, DM,  No CP or dyspnea. Has had URI,  Has been on several courses of Abx.  I suggested Zyrtec or claritan         Current Outpatient Prescriptions on File Prior to Visit  Medication Sig Dispense Refill   . amLODipine (NORVASC) 10 MG tablet Take 10 mg by mouth daily.  2  . aspirin 81 MG tablet Take 81 mg by mouth daily.      [name] atorvastatin (LIPITOR) 20 MG tablet Take 20 mg by mouth at bedtime.  2  . Docusate Calcium (STOOL SOFTENER PO) Take 1 capsule by mouth daily as needed (constipation).     . fenofibrate micronized (LOFIBRA) 200 MG capsule Take 1 capsule (200 mg total) by mouth daily before breakfast. 90 capsule 3  . fexofenadine (ALLEGRA) 180 MG tablet Take 180 mg by mouth daily as needed for allergies or rhinitis (hayfever).     . fish oil-omega-3 fatty acids 1000 MG capsule Take 2 g by mouth daily.      [name] glipiZIDE (GLUCOTROL XL) 5 MG 24 hr tablet Take 5 mg by mouth daily with breakfast.    . metFORMIN (GLUCOPHAGE) 1000 MG tablet Take 1,000 mg by mouth 2 (two) times daily with a meal.      . multivitamin (THERAGRAN) per tablet Take 1 tablet by mouth daily.      . sitaGLIPtan (JANUVIA) 100 MG tablet Take 50 mg by mouth  daily.     . vitamin B-12 (CYANOCOBALAMIN) 1000 MCG tablet Take 2,000 mcg by mouth daily.     [name] warfarin (COUMADIN) 5 MG tablet Take 1 tablet (5 mg total) by mouth daily. 90 tablet 1   No current facility-administered medications on file prior to visit.    Allergies  Allergen Reactions  . Codeine Nausea And Vomiting    Nausea and vomiting   . Simvastatin Other (See Comments)    Leg aches    Past Medical History  Diagnosis Date  . Diabetes mellitus type 2, noninsulin dependent   . Hyperlipidemia   . Hypertension   . Sick sinus syndrome     a-Flutter with episodes of bradycardia; S/P Biotronik (serial number [id number])  . Pacemaker 02/08/2014    DR Oscar Baker  . prostate ca dx'd 2006    xrt; prostatectomy    Past Surgical History  Procedure Laterality Date  . Back surgery      disk  . Appendectomy    . Tonsillectomy    . Total knee arthroplasty    . Pacemaker insertion  02/08/2014    Biotronik (serial number [id number])  . Insert / replace / remove  pacemaker  02/08/2014     DR Oscar Baker  . Permanent pacemaker insertion N/A 02/08/2014    Procedure: PERMANENT PACEMAKER INSERTION;  Surgeon: Oscar Lance, MD;  Location[facility] C[facility] LAB;  Service: Cardiovascular;  Laterality: N/A;    History  Smoking status  . Former Smoker  . Types: Pipe  . Quit date: 11/25/1975  Smokeless tobacco  . Never Used    History  Alcohol Use No    Family History  Problem Relation Age of Onset  . Emphysema Mother   . Heart attack Father     Reviw of Systems:  Reviewed in the HPI.  All other systems are negative.  Physical Exam: Blood pressure 124/62, pulse 74, height 5' 10.5" (1.791 m), weight 226 lb (102.513 kg). General: Well developed, well nourished, in no acute distress.  Head: Normocephalic, atraumatic, sclera non-icteric, mucus membranes are moist,   Neck: Supple. Carotids are 2 + without bruits. No JVD  Lungs: bilateral wheezing  Heart: regular rate  With normal  S1 S2. No murmurs, gallops or rubs.  Abdomen: Soft, non-tender, non-distended with normal bowel sounds. No hepatomegaly. No rebound/guarding. No masses.  Msk:  Strength and tone are normal  Extremities: No clubbing or cyanosis. Trace bilateral edema.  Distal pedal pulses are 2+ and equal bilaterally.  Neuro: Alert and oriented X 3. Moves all extremities spontaneously.  Psych:  Responds to questions appropriately with a normal affect.  ECG  .   Assessment / Plan:

## 2014-07-10 DIAGNOSIS — H903 Sensorineural hearing loss, bilateral: Secondary | ICD-10-CM | POA: Diagnosis not present

## 2014-07-10 DIAGNOSIS — J320 Chronic maxillary sinusitis: Secondary | ICD-10-CM | POA: Diagnosis not present

## 2014-07-10 DIAGNOSIS — H6122 Impacted cerumen, left ear: Secondary | ICD-10-CM | POA: Diagnosis not present

## 2014-07-10 DIAGNOSIS — J370 Chronic laryngitis: Secondary | ICD-10-CM | POA: Diagnosis not present

## 2014-07-10 DIAGNOSIS — J410 Simple chronic bronchitis: Secondary | ICD-10-CM | POA: Diagnosis not present

## 2014-07-10 DIAGNOSIS — J322 Chronic ethmoidal sinusitis: Secondary | ICD-10-CM | POA: Diagnosis not present

## 2014-07-17 ENCOUNTER — Ambulatory Visit (INDEPENDENT_AMBULATORY_CARE_PROVIDER_SITE_OTHER): Payer: Medicare Other | Admitting: *Deleted

## 2014-07-17 DIAGNOSIS — I4891 Unspecified atrial fibrillation: Secondary | ICD-10-CM | POA: Diagnosis not present

## 2014-07-17 DIAGNOSIS — I4892 Unspecified atrial flutter: Secondary | ICD-10-CM

## 2014-07-17 LAB — POCT INR: INR: 2.2

## 2014-07-18 DIAGNOSIS — M1711 Unilateral primary osteoarthritis, right knee: Secondary | ICD-10-CM | POA: Diagnosis not present

## 2014-07-18 DIAGNOSIS — Z96641 Presence of right artificial hip joint: Secondary | ICD-10-CM | POA: Diagnosis not present

## 2014-07-18 DIAGNOSIS — M25561 Pain in right knee: Secondary | ICD-10-CM | POA: Diagnosis not present

## 2014-07-18 DIAGNOSIS — M25551 Pain in right hip: Secondary | ICD-10-CM | POA: Diagnosis not present

## 2014-07-27 ENCOUNTER — Other Ambulatory Visit (INDEPENDENT_AMBULATORY_CARE_PROVIDER_SITE_OTHER): Payer: Medicare Other

## 2014-07-27 ENCOUNTER — Encounter: Payer: Self-pay | Admitting: Internal Medicine

## 2014-07-27 ENCOUNTER — Ambulatory Visit (INDEPENDENT_AMBULATORY_CARE_PROVIDER_SITE_OTHER): Payer: Medicare Other | Admitting: Internal Medicine

## 2014-07-27 VITALS — BP 124/80 | HR 65 | Temp 98.0°F | Ht 71.0 in | Wt 217.0 lb

## 2014-07-27 DIAGNOSIS — J45991 Cough variant asthma: Secondary | ICD-10-CM

## 2014-07-27 LAB — CBC WITH DIFFERENTIAL/PLATELET
BASOS ABS: 0.1 10*3/uL (ref 0.0–0.1)
Basophils Relative: 0.3 % (ref 0.0–3.0)
Eosinophils Absolute: 0.3 10*3/uL (ref 0.0–0.7)
Eosinophils Relative: 1.3 % (ref 0.0–5.0)
HEMATOCRIT: 41.1 % (ref 39.0–52.0)
Hemoglobin: 13.7 g/dL (ref 13.0–17.0)
Lymphocytes Relative: 68.8 % — ABNORMAL HIGH (ref 12.0–46.0)
Lymphs Abs: 15.8 10*3/uL — ABNORMAL HIGH (ref 0.7–4.0)
MCHC: 33.5 g/dL (ref 30.0–36.0)
MCV: 98.8 fl (ref 78.0–100.0)
MONOS PCT: 15.2 % — AB (ref 3.0–12.0)
Monocytes Absolute: 3.5 10*3/uL — ABNORMAL HIGH (ref 0.1–1.0)
NEUTROS PCT: 14.4 % — AB (ref 43.0–77.0)
Neutro Abs: 3.3 10*3/uL (ref 1.4–7.7)
PLATELETS: 255 10*3/uL (ref 150.0–400.0)
RBC: 4.15 Mil/uL — AB (ref 4.22–5.81)
RDW: 21.1 % — ABNORMAL HIGH (ref 11.5–15.5)
WBC: 22.9 10*3/uL (ref 4.0–10.5)

## 2014-07-27 MED ORDER — FAMOTIDINE 20 MG PO TABS: ORAL_TABLET | ORAL | Status: DC

## 2014-07-27 MED ORDER — AMOXICILLIN-POT CLAVULANATE 875-125 MG PO TABS: 1.0000 | ORAL_TABLET | Freq: Two times a day (BID) | ORAL | Status: DC

## 2014-07-27 MED ORDER — PREDNISONE 10 MG PO TABS: ORAL_TABLET | ORAL | Status: DC

## 2014-07-27 MED ORDER — TRAMADOL HCL 50 MG PO TABS: ORAL_TABLET | ORAL | Status: DC

## 2014-07-27 MED ORDER — PANTOPRAZOLE SODIUM 40 MG PO TBEC: 40.0000 mg | DELAYED_RELEASE_TABLET | Freq: Every day | ORAL | Status: DC

## 2014-07-27 NOTE — Patient Instructions (Addendum)
Augmentin 875 mg take one pill twice daily  X 10 days - take at breakfast and supper with large glass of water.  It would help reduce the usual side effects (diarrhea and yeast infections) if you ate cultured yogurt at lunch.  - Note: yourcoumadin may need to be adjusted on this  medication  Prednisone 10 mg take  4 each am x 2 days,   2 each am x 2 days,  1 each am x 2 days and stop   Pantoprazole (protonix) 40 mg   Take 30-60 min before first meal of the day and Pepcid 20 mg one bedtime until return to office - this is the best way to tell whether stomach acid is contributing to your problem.    For cough > mucinex dm 1200 mg every 12 hours and supplement with tramadol 50 mg 1 every 4 hours if needed   GERD (REFLUX)  is an extremely common cause of respiratory symptoms just like yours , many times with no obvious heartburn at all.    It can be treated with medication, but also with lifestyle changes including avoidance of late meals, excessive alcohol, smoking cessation, and avoid fatty foods, chocolate, peppermint, colas, red wine, and acidic juices such as orange juice.  NO MINT OR MENTHOL PRODUCTS SO NO COUGH DROPS  USE SUGARLESS CANDY INSTEAD (Jolley ranchers or Stover's or Life Savers) or even ice chips will also do - the key is to swallow to prevent all throat clearing. NO OIL BASED VITAMINS - use powdered substitutes.  Please remember to go to the lab   department downstairs for your tests - we will call you with the results when they are available.    Please schedule a follow up office visit in 2 weeks, sooner if needed  Late add consider trial of ICS next

## 2014-07-27 NOTE — Progress Notes (Signed)
Subjective:    Patient ID: Oscar Baker, male    DOB: 08/23/1932,   MRN: [id number]  HPI  27 yowm with CLL / never regular cig smoker/ stopped pipe in 1980 assoc with  some cough which completely  resolved  p quit  then summer  of 2015 gradual onset sensation "like something stuck in throat" > stopped ACEi > referred to [facility] seen in Jan 2016 with dx of bronchitis and referred to pulmonary by Dr Virgina Jock and first seen 07/27/2014    07/27/2014 [facility] office visit/ Oscar Baker   Chief Complaint  Patient presents with  . Pulmonary Consult    Referred by Dr. Virgina Jock. Pt c/o cough x 3-4 months- prod with moderate green sputum. He states cough is worse first thing in the am and then in the evening.   indolent onset persistent daily cough  X summer 2015 "when feet hit the floor each am"/ and worse at hs and after meals   / no better with hfa / d/c acei/ multile abx > no better and not impressed that saba helps either   No obvious other patterns in day to day or daytime variabilty or assoc  Sob  or cp or chest tightness, subjective wheeze overt sinus or hb symptoms. No unusual exp hx or h/o childhood pna/ asthma or knowledge of premature birth.  Sleeping ok without nocturnal  or early am exacerbation  of respiratory  c/o's or need for noct saba. Also denies any obvious fluctuation of symptoms with weather or environmental changes or other aggravating or alleviating factors except as outlined above   Current Medications, Allergies, Complete Past Medical History, Past Surgical History, Family History, and Social History were reviewed in [employer] record.           Review of Systems  Constitutional: Negative for fever, chills, activity change, appetite change and unexpected weight change.  HENT: Positive for sneezing. Negative for congestion, dental problem, postnasal drip, rhinorrhea, sore throat, trouble swallowing and voice change.   Eyes: Negative for visual  disturbance.  Respiratory: Positive for cough. Negative for choking and shortness of breath.   Cardiovascular: Negative for chest pain and leg swelling.  Gastrointestinal: Negative for nausea, vomiting and abdominal pain.  Genitourinary: Negative for difficulty urinating.  Musculoskeletal: Negative for arthralgias.  Skin: Negative for rash.  Psychiatric/Behavioral: Negative for behavioral problems and confusion.       Objective:   Physical Exam  amb pleasant wm nad /nasal tone to voice/ congested sounding cough prod of about a tsp of yellow mucus  Wt Readings from Last 3 Encounters:  07/27/14 217 lb (98.431 kg)  06/20/14 226 lb (102.513 kg)  05/15/14 232 lb 6.4 oz (105.416 kg)    Vital signs reviewed  HEENT: nl dentition, turbinates, and orophanx. Nl external ear canals without cough reflex   NECK :  without JVD/Nodes/TM/ nl carotid upstrokes bilaterally   LUNGS: no acc muscle use,  insp and exp rhonchi    CV:  RRR  no s3 or murmur or increase in P2, no edema   ABD:  soft and nontender with nl excursion in the supine position. No bruits or organomegaly, bowel sounds nl  MS:  warm without deformities, calf tenderness, cyanosis or clubbing  SKIN: warm and dry without lesions    NEURO:  alert, approp, no deficits   CT chest:  04/17/14  I personally reviewed images and agree with radiology impression as follows:   1. Multiple small  axillary lymph nodes. 2. Small mediastinal lymph nodes. 3. Single mildly enlarged left supra / infraclavicular node.     07/27/14 Labs ordered Cbc with diff >   eos 0.3 Allergy profile > pending       Assessment & Plan:

## 2014-07-28 NOTE — Assessment & Plan Note (Addendum)
The most common causes of chronic cough in immunocompetent adults include the following: upper airway cough syndrome (UACS), previously referred to as postnasal drip syndrome (PNDS), which is caused by variety of rhinosinus conditions; (2) asthma; (3) GERD; (4) chronic bronchitis from cigarette smoking or other inhaled environmental irritants; (5) nonasthmatic eosinophilic bronchitis; and (6) bronchiectasis.   These conditions, singly or in combination, have accounted for up to 94% of the causes of chronic cough in prospective studies.   Other conditions have constituted no >6% of the causes in prospective studies These have included bronchogenic carcinoma, chronic interstitial pneumonia, sarcoidosis, left ventricular failure, ACEI-induced cough, and aspiration from a condition associated with pharyngeal dysfunction.    Chronic cough is often simultaneously caused by more than one condition. A single cause has been found from 38 to 82% of the time, multiple causes from 18 to 62%. Multiply caused cough has been the result of three diseases up to 42% of the time.      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 perhaps with component of uacs fueled by gerd since cough worse p meals and intolerant of ACEi  rec max gerd rx/ short cycle pred/ rx augmentin since mucus purulent now > f/u in 2 weeks to consider trial of ICS next   Discussed with pt The standardized cough guidelines published in Chest by Lissa Morales in 2006 are still the best available and consist of a multiple step process (up to 12!) , not a single office visit,  and are intended  to address this problem logically,  with an alogrithm dependent on response to empiric treatment at  each progressive step  to determine a specific diagnosis with  minimal addtional testing needed. Therefore if adherence is an issue or can't be accurately verified,  it's very unlikely the standard evaluation and treatment will be successful here.    Furthermore,  response to therapy (other than acute cough suppression, which should only be used short term with avoidance of narcotic containing cough syrups if possible), can be a gradual process for which the patient may perceive immediate benefit.  Unlike going to an eye doctor where the best perscription is almost always the first one and is immediately effective, this is almost never the case in the management of chronic cough syndromes. Therefore the patient needs to commit up front to consistently adhere to recommendations  for up to 6 weeks of therapy directed at the likely underlying problem(s) before the response can be reasonably evaluated.

## 2014-07-30 ENCOUNTER — Telehealth: Payer: Self-pay | Admitting: *Deleted

## 2014-07-30 LAB — ALLERGY FULL PROFILE
Allergen, D pternoyssinus,d7: 1.41 kU/L — ABNORMAL HIGH
Allergen,Goose feathers, e70: 0.12 kU/L — ABNORMAL HIGH
Alternaria Alternata: <0.1 kU/L
BAHIA GRASS: 0.3 kU/L — AB
Bermuda Grass: 0.32 kU/L — ABNORMAL HIGH
Box Elder IgE: 0.51 kU/L — ABNORMAL HIGH
Candida Albicans: 2.05 kU/L — ABNORMAL HIGH
Cat Dander: <0.1 kU/L
Common Ragweed: 0.42 kU/L — ABNORMAL HIGH
Curvularia lunata: <0.1 kU/L
D. FARINAE: 1.3 kU/L — AB
Dog Dander: <0.1 kU/L
Elm IgE: 0.49 kU/L — ABNORMAL HIGH
Fescue: 0.29 kU/L — ABNORMAL HIGH
G005 Rye, Perennial: 0.29 kU/L — ABNORMAL HIGH
G009 Red Top: 0.31 kU/L — ABNORMAL HIGH
Goldenrod: 0.27 kU/L — ABNORMAL HIGH
Helminthosporium halodes: <0.1 kU/L
House Dust Hollister: 0.14 kU/L — ABNORMAL HIGH
IGE (IMMUNOGLOBULIN E), SERUM: 391 kU/L — AB (ref <115)
Lamb's Quarters: 0.51 kU/L — ABNORMAL HIGH
OAK CLASS: 0.24 kU/L — AB
Plantain: 0.29 kU/L — ABNORMAL HIGH
Sycamore Tree: 0.41 kU/L — ABNORMAL HIGH
Timothy Grass: 0.28 kU/L — ABNORMAL HIGH

## 2014-07-30 NOTE — Telephone Encounter (Signed)
Spoke with pt and notified of results per Dr. Wert. Pt verbalized understanding and denied any questions. 

## 2014-07-30 NOTE — Telephone Encounter (Signed)
-----   Message from Tanda Rockers, MD sent at 07/30/2014  3:23 PM EST ----- Let him know allergy tests pos multiple sources too numerous to list and nothing obvious to avoid for now but will discuss specifics and f/u next ov

## 2014-08-01 ENCOUNTER — Institutional Professional Consult (permissible substitution): Payer: Medicare Other | Admitting: Internal Medicine

## 2014-08-07 ENCOUNTER — Telehealth: Payer: Self-pay | Admitting: Internal Medicine

## 2014-08-07 NOTE — Telephone Encounter (Signed)
Informed pt that home monitor is automatic and he does not have to do anything. Reschedule remote appt b/c pt will be out of town.

## 2014-08-07 NOTE — Telephone Encounter (Signed)
New Msg          Pt calling, has questions about remote device check.   Pt isn't sure about the process.   Please return pt call.

## 2014-08-10 ENCOUNTER — Encounter: Payer: Self-pay | Admitting: Internal Medicine

## 2014-08-10 ENCOUNTER — Ambulatory Visit (INDEPENDENT_AMBULATORY_CARE_PROVIDER_SITE_OTHER): Payer: Medicare Other | Admitting: Internal Medicine

## 2014-08-10 VITALS — BP 146/76 | HR 71 | Ht 71.0 in | Wt 226.8 lb

## 2014-08-10 DIAGNOSIS — J45991 Cough variant asthma: Secondary | ICD-10-CM

## 2014-08-10 MED ORDER — MONTELUKAST SODIUM 10 MG PO TABS: 10.0000 mg | ORAL_TABLET | Freq: Every day | ORAL | Status: DC

## 2014-08-10 NOTE — Progress Notes (Signed)
Subjective:    Patient ID: Oscar Baker, male    DOB: 09/04/1932,   MRN: [id number]   Brief patient profile:  47 yowm with CLL / never regular cig smoker/ stopped pipe in 1980 assoc with  some cough which completely  resolved  p quit  then summer  of 2015 gradual onset sensation "like something stuck in throat" > stopped ACEi > referred to [facility] seen in Jan 2016 with dx of bronchitis and referred to pulmonary by Dr Virgina Jock and first seen 07/27/2014    History of Present Illness  07/27/2014 1st [facility] office visit/ Oscar Baker   Chief Complaint  Patient presents with  . Pulmonary Consult    Referred by Dr. Virgina Jock. Pt c/o cough x 3-4 months- prod with moderate green sputum. He states cough is worse first thing in the am and then in the evening.   indolent onset persistent daily cough  X summer 2015 "when feet hit the floor each am"/ and worse at hs and after meals   / no better with hfa / d/c acei/ multile abx > no better and not impressed that saba helps either  rec Augmentin 875 mg take one pill twice daily  X 10 days - take at breakfast and supper with large glass of water.  It would help reduce the usual side effects (diarrhea and yeast infections) if you ate cultured yogurt at lunch.  - Note: yourcoumadin may need to be adjusted on this  medication Prednisone 10 mg take  4 each am x 2 days,   2 each am x 2 days,  1 each am x 2 days and stop  Pantoprazole (protonix) 40 mg   Take 30-60 min before first meal of the day and Pepcid 20 mg one bedtime until return to office - this is the best way to tell whether stomach acid is contributing to your problem.   for cough > mucinex dm 1200 mg every 12 hours and supplement with tramadol 50 mg 1 every 4 hours if needed  GERD  Diet  Late add consider trial of ICS next    08/10/2014 f/u ov/Oscar Baker re: prob cough variant asthma  Chief Complaint  Patient presents with  . Follow-up    Pt states that his cough is much improved. He states he is still  "spitting up" clear sputum.   did not maintain on rx for gerd Cough is worse p breakfast > white mucus but much better than it was Not limited by breathing from desired activities    No obvious day to day or daytime variabilty or assoc chronic cough or cp or chest tightness, subjective wheeze overt sinus or hb symptoms. No unusual exp hx or h/o childhood pna/ asthma or knowledge of premature birth.  Sleeping ok without nocturnal  or early am exacerbation  of respiratory  c/o's or need for noct saba. Also denies any obvious fluctuation of symptoms with weather or environmental changes or other aggravating or alleviating factors except as outlined above   Current Medications, Allergies, Complete Past Medical History, Past Surgical History, Family History, and Social History were reviewed in [employer] record.  ROS  The following are not active complaints unless bolded sore throat, dysphagia, dental problems, itching, sneezing,  nasal congestion or excess/ purulent secretions, ear ache,   fever, chills, sweats, unintended wt loss, pleuritic or exertional cp, hemoptysis,  orthopnea pnd or leg swelling, presyncope, palpitations, heartburn, abdominal pain, anorexia, nausea, vomiting, diarrhea  or change  in bowel or urinary habits, change in stools or urine, dysuria,hematuria,  rash, arthralgias, visual complaints, headache, numbness weakness or ataxia or problems with walking or coordination,  change in mood/affect or memory.          Objective:   Physical Exam  amb pleasant wm nad   08/10/2014        227  Wt Readings from Last 3 Encounters:  07/27/14 217 lb (98.431 kg)  06/20/14 226 lb (102.513 kg)  05/15/14 232 lb 6.4 oz (105.416 kg)    Vital signs reviewed  HEENT: nl dentition, turbinates, and orophanx. Nl external ear canals without cough reflex   NECK :  without JVD/Nodes/TM/ nl carotid upstrokes bilaterally   LUNGS: no acc muscle use,  psueudowheeze resolves  with plm    CV:  RRR  no s3 or murmur or increase in P2, no edema   ABD:  soft and nontender with nl excursion in the supine position. No bruits or organomegaly, bowel sounds nl  MS:  warm without deformities, calf tenderness, cyanosis or clubbing  SKIN: warm and dry without lesions    NEURO:  alert, approp, no deficits   CT chest:  04/17/14  I personally reviewed images and agree with radiology impression as follows:   1. Multiple small axillary lymph nodes. 2. Small mediastinal lymph nodes. 3. Single mildly enlarged left supra / infraclavicular node.     07/27/14 Labs ordered Cbc with diff >   eos 0.3        Assessment & Plan:

## 2014-08-10 NOTE — Patient Instructions (Addendum)
Start singulair 10 mg each pm (montelukast) along with zyrtec at bedtime   For cough > mucinex dm 1200 every hours   Pantoprazole (protonix) 40 mg   Take 30-60 min before first meal of the day and Pepcid 20 mg one bedtime until return to office - this is the best way to tell whether stomach acid is contributing to your problem.    GERD (REFLUX)  is an extremely common cause of respiratory symptoms just like yours , many times with no obvious heartburn at all.    It can be treated with medication, but also with lifestyle changes including avoidance of late meals, excessive alcohol, smoking cessation, and avoid fatty foods, chocolate, peppermint, colas, red wine, and acidic juices such as orange juice.  NO MINT OR MENTHOL PRODUCTS SO NO COUGH DROPS  USE SUGARLESS CANDY INSTEAD (Jolley ranchers or Stover's or Life Savers) or even ice chips will also do - the key is to swallow to prevent all throat clearing. NO OIL BASED VITAMINS - use powdered substitutes.   See Tammy NP in 4  weeks with all your medications, even over the counter meds, separated in two separate bags, the ones you take no matter what vs the ones you stop once you feel better and take only as needed when you feel you need them.   Tammy  will generate for you a new user friendly medication calendar that will put [place] all on the same page re: your medication use.     Without this process, it simply isn't possible to assure that we are providing  your outpatient care  with  the attention to detail we feel you deserve.   If we cannot assure that you're getting that kind of care,  then we cannot manage your problem effectively from this clinic.  Once you have seen Tammy and we are sure that we're all on the same page with your medication use she will arrange follow up with me.

## 2014-08-11 ENCOUNTER — Encounter: Payer: Self-pay | Admitting: Internal Medicine

## 2014-08-11 NOTE — Assessment & Plan Note (Signed)
-   Allergy profile  07/27/14 Eos 0.3  IgE  391 > Pos RAST dust/ mold/treees/ grasses  Dx is cough variant asthma / allergic rhinitis vs uacs from pnds but either way may respond to singulair so good first choice  I had an extended discussion with the patient reviewing all relevant studies completed to date and  lasting 15 to 20 minutes of a 25 minute visit on the following ongoing concerns:  The standardized cough guidelines published in Chest by Lissa Morales in 2006 are still the best available and consist of a multiple step process (up to 12!) , not a single office visit,  and are intended  to address this problem logically,  with an alogrithm dependent on response to empiric treatment at  each progressive step  to determine a specific diagnosis with  minimal addtional testing needed. Therefore if adherence is an issue or can't be accurately verified,  it's very unlikely the standard evaluation and treatment will be successful here.    Furthermore, response to therapy (other than acute cough suppression, which should only be used short term with avoidance of narcotic containing cough syrups if possible), can be a gradual process for which the patient may perceive immediate benefit.  Unlike going to an eye doctor where the best perscription is almost always the first one and is immediately effective, this is almost never the case in the management of chronic cough syndromes. Therefore the patient needs to commit up front to consistently adhere to recommendations  for up to 6 weeks of therapy directed at the likely underlying problem(s) before the response can be reasonably evaluated.   Next step is med rec > we need to make sure he does what we ask him to do before we ask him to do more  See instructions for specific recommendations which were reviewed directly with the patient who was given a copy with highlighter outlining the key components.

## 2014-08-14 ENCOUNTER — Ambulatory Visit: Payer: Medicare Other

## 2014-08-15 ENCOUNTER — Encounter: Payer: Self-pay | Admitting: Cardiology

## 2014-08-17 ENCOUNTER — Institutional Professional Consult (permissible substitution): Payer: Medicare Other | Admitting: Pulmonary Disease

## 2014-08-20 ENCOUNTER — Ambulatory Visit (INDEPENDENT_AMBULATORY_CARE_PROVIDER_SITE_OTHER): Payer: Medicare Other | Admitting: *Deleted

## 2014-08-20 DIAGNOSIS — I495 Sick sinus syndrome: Secondary | ICD-10-CM | POA: Diagnosis not present

## 2014-08-20 LAB — MDC_IDC_ENUM_SESS_TYPE_REMOTE
Brady Statistic RV Percent Paced: 94 %
Implantable Pulse Generator Serial Number: 68372291
MDC IDC PG MODEL: 394971
MDC IDC SET LEADCHNL RV PACING AMPLITUDE: 2.4 V
MDC IDC SET LEADCHNL RV PACING PULSEWIDTH: 0.4 ms

## 2014-08-20 NOTE — Progress Notes (Signed)
Remote pacemaker transmission.   

## 2014-08-24 ENCOUNTER — Encounter: Payer: Self-pay | Admitting: Cardiology

## 2014-08-28 ENCOUNTER — Ambulatory Visit (INDEPENDENT_AMBULATORY_CARE_PROVIDER_SITE_OTHER): Payer: Medicare Other | Admitting: *Deleted

## 2014-08-28 DIAGNOSIS — I4891 Unspecified atrial fibrillation: Secondary | ICD-10-CM | POA: Diagnosis not present

## 2014-08-28 DIAGNOSIS — I4892 Unspecified atrial flutter: Secondary | ICD-10-CM

## 2014-08-28 LAB — POCT INR: INR: 2.5

## 2014-08-29 ENCOUNTER — Encounter: Payer: Self-pay | Admitting: Internal Medicine

## 2014-09-11 ENCOUNTER — Ambulatory Visit (INDEPENDENT_AMBULATORY_CARE_PROVIDER_SITE_OTHER): Payer: Medicare Other | Admitting: Adult Health

## 2014-09-11 ENCOUNTER — Encounter: Payer: Self-pay | Admitting: Adult Health

## 2014-09-11 VITALS — BP 124/76 | HR 71 | Temp 97.6°F | Ht 70.5 in | Wt 222.0 lb

## 2014-09-11 DIAGNOSIS — Z23 Encounter for immunization: Secondary | ICD-10-CM | POA: Diagnosis not present

## 2014-09-11 DIAGNOSIS — J45991 Cough variant asthma: Secondary | ICD-10-CM

## 2014-09-11 NOTE — Progress Notes (Signed)
Subjective:    Patient ID: Oscar Baker, male    DOB: Jan 02, 1933,   MRN: [id number]   Brief patient profile:  77 yowm with CLL / never regular cig smoker/ stopped pipe in 1980 assoc with  some cough which completely  resolved  p quit  then summer  of 2015 gradual onset sensation "like something stuck in throat" > stopped ACEi > referred to [facility] seen in Jan 2016 with dx of bronchitis and referred to pulmonary by Dr Virgina Jock and first seen 07/27/2014    History of Present Illness  07/27/2014 1st [facility] office visit/ Wert   Chief Complaint  Patient presents with  . Pulmonary Consult    Referred by Dr. Virgina Jock. Pt c/o cough x 3-4 months- prod with moderate green sputum. He states cough is worse first thing in the am and then in the evening.   indolent onset persistent daily cough  X summer 2015 "when feet hit the floor each am"/ and worse at hs and after meals   / no better with hfa / d/c acei/ multile abx > no better and not impressed that saba helps either  rec Augmentin 875 mg take one pill twice daily  X 10 days - take at breakfast and supper with large glass of water.  It would help reduce the usual side effects (diarrhea and yeast infections) if you ate cultured yogurt at lunch.  - Note: yourcoumadin may need to be adjusted on this  medication Prednisone 10 mg take  4 each am x 2 days,   2 each am x 2 days,  1 each am x 2 days and stop  Pantoprazole (protonix) 40 mg   Take 30-60 min before first meal of the day and Pepcid 20 mg one bedtime until return to office - this is the best way to tell whether stomach acid is contributing to your problem.   for cough > mucinex dm 1200 mg every 12 hours and supplement with tramadol 50 mg 1 every 4 hours if needed  GERD  Diet  Late add consider trial of ICS next    08/10/2014 f/u ov/Wert re: prob cough variant asthma  Chief Complaint  Patient presents with  . Follow-up    Pt states that his cough is much improved. He states he is still  "spitting up" clear sputum.   did not maintain on rx for gerd Cough is worse p breakfast > white mucus but much better than it was Not limited by breathing from desired activities   >add singulair   09/11/2014 Follow up : Prob Cough Variant Asthma  Returns for a 1 month follow up for Cough.  Has been placed on aggressive regimen of GERD/AR prevention .  Cough is resolved. He is feeling so much better.  Singulair was added last ov.  Prev RAST test showed multiple positive allergens with elevated IgE.  We reviewed all his meds and organized them into a med calendar with pt education  Appears to be taking correctly.  No chest pian, orthopnea, increased edema or fever.       Current Medications, Allergies, Complete Past Medical History, Past Surgical History, Family History, and Social History were reviewed in [employer] record.  ROS  The following are not active complaints unless bolded sore throat, dysphagia, dental problems, itching, sneezing,  nasal congestion or excess/ purulent secretions, ear ache,   fever, chills, sweats, unintended wt loss, pleuritic or exertional cp, hemoptysis,  orthopnea pnd or  leg swelling, presyncope, palpitations, heartburn, abdominal pain, anorexia, nausea, vomiting, diarrhea  or change in bowel or urinary habits, change in stools or urine, dysuria,hematuria,  rash, arthralgias, visual complaints, headache, numbness weakness or ataxia or problems with walking or coordination,  change in mood/affect or memory.          Objective:   Physical Exam  amb pleasant wm nad   08/10/2014        227>222 09/11/2014    Vital signs reviewed  HEENT: nl dentition, turbinates, and orophanx. Nl external ear canals without cough reflex   NECK :  without JVD/Nodes/TM/ nl carotid upstrokes bilaterally   LUNGS: no acc muscle use, CTA w/ no wheezing /cough or psuedowheeze    CV:  RRR  no s3 or murmur or increase in P2, tr edema   ABD:  soft  and nontender with nl excursion in the supine position. No bruits or organomegaly, bowel sounds nl  MS:  warm without deformities, calf tenderness, cyanosis or clubbing  SKIN: warm and dry without lesions    NEURO:  alert, approp, no deficits   CT chest:  04/17/14  I personally reviewed images and agree with radiology impression as follows:   1. Multiple small axillary lymph nodes. 2. Small mediastinal lymph nodes. 3. Single mildly enlarged left supra / infraclavicular node.     07/27/14 Labs ordered Cbc with diff >   eos 0.3        Assessment & Plan:

## 2014-09-11 NOTE — Addendum Note (Signed)
Addended by: Parke Poisson E on: 09/11/2014 10:15 AM   Modules accepted: Orders

## 2014-09-11 NOTE — Patient Instructions (Signed)
Follow med calendar closely and bring to each visit.  Keep up good work .  follow up Dr. Melvyn Novas  In 4-6 weeks and As needed

## 2014-09-11 NOTE — Assessment & Plan Note (Addendum)
Doing very well on regimen aimed at GERD/AR prevention  Cont on current regimen  If remains cough free on return , consider changing  PPI /Pepcid to As needed  List.  Patient's medications were reviewed today and patient education was given. Computerized medication calendar was adjusted/completed   Plan  Follow med calendar closely and bring to each visit.  Keep up good work .  follow up Dr. Melvyn Novas  In 4-6 weeks and As needed

## 2014-09-24 ENCOUNTER — Other Ambulatory Visit: Payer: Self-pay | Admitting: Cardiovascular Disease

## 2014-09-25 NOTE — Addendum Note (Signed)
Addended by: Parke Poisson E on: 09/25/2014 01:48 PM   Modules accepted: Orders, Medications

## 2014-10-08 DIAGNOSIS — H47322 Drusen of optic disc, left eye: Secondary | ICD-10-CM | POA: Diagnosis not present

## 2014-10-08 DIAGNOSIS — E119 Type 2 diabetes mellitus without complications: Secondary | ICD-10-CM | POA: Diagnosis not present

## 2014-10-08 DIAGNOSIS — H40013 Open angle with borderline findings, low risk, bilateral: Secondary | ICD-10-CM | POA: Diagnosis not present

## 2014-10-08 DIAGNOSIS — H2513 Age-related nuclear cataract, bilateral: Secondary | ICD-10-CM | POA: Diagnosis not present

## 2014-10-09 ENCOUNTER — Ambulatory Visit (INDEPENDENT_AMBULATORY_CARE_PROVIDER_SITE_OTHER): Payer: Medicare Other

## 2014-10-09 DIAGNOSIS — I4891 Unspecified atrial fibrillation: Secondary | ICD-10-CM

## 2014-10-09 DIAGNOSIS — I4892 Unspecified atrial flutter: Secondary | ICD-10-CM | POA: Diagnosis not present

## 2014-10-09 LAB — POCT INR: INR: 2

## 2014-10-16 DIAGNOSIS — E785 Hyperlipidemia, unspecified: Secondary | ICD-10-CM | POA: Diagnosis not present

## 2014-10-16 DIAGNOSIS — I272 Other secondary pulmonary hypertension: Secondary | ICD-10-CM | POA: Diagnosis not present

## 2014-10-16 DIAGNOSIS — C9100 Acute lymphoblastic leukemia not having achieved remission: Secondary | ICD-10-CM | POA: Diagnosis not present

## 2014-10-16 DIAGNOSIS — Z950 Presence of cardiac pacemaker: Secondary | ICD-10-CM | POA: Diagnosis not present

## 2014-10-16 DIAGNOSIS — E119 Type 2 diabetes mellitus without complications: Secondary | ICD-10-CM | POA: Diagnosis not present

## 2014-10-16 DIAGNOSIS — K219 Gastro-esophageal reflux disease without esophagitis: Secondary | ICD-10-CM | POA: Diagnosis not present

## 2014-10-16 DIAGNOSIS — Z1389 Encounter for screening for other disorder: Secondary | ICD-10-CM | POA: Diagnosis not present

## 2014-10-16 DIAGNOSIS — I10 Essential (primary) hypertension: Secondary | ICD-10-CM | POA: Diagnosis not present

## 2014-10-16 DIAGNOSIS — Z6833 Body mass index (BMI) 33.0-33.9, adult: Secondary | ICD-10-CM | POA: Diagnosis not present

## 2014-10-23 ENCOUNTER — Ambulatory Visit (HOSPITAL_BASED_OUTPATIENT_CLINIC_OR_DEPARTMENT_OTHER): Payer: Medicare Other | Admitting: Oncology

## 2014-10-23 ENCOUNTER — Telehealth: Payer: Self-pay | Admitting: Oncology

## 2014-10-23 ENCOUNTER — Other Ambulatory Visit (HOSPITAL_BASED_OUTPATIENT_CLINIC_OR_DEPARTMENT_OTHER): Payer: Medicare Other

## 2014-10-23 VITALS — BP 151/68 | HR 72 | Temp 97.9°F | Resp 18 | Ht 70.5 in | Wt 229.2 lb

## 2014-10-23 DIAGNOSIS — C9110 Chronic lymphocytic leukemia of B-cell type not having achieved remission: Secondary | ICD-10-CM

## 2014-10-23 DIAGNOSIS — R591 Generalized enlarged lymph nodes: Secondary | ICD-10-CM

## 2014-10-23 LAB — CBC WITH DIFFERENTIAL/PLATELET
BASO%: 0.4 % (ref 0.0–2.0)
BASOS ABS: 0.1 10*3/uL (ref 0.0–0.1)
EOS ABS: 0.5 10*3/uL (ref 0.0–0.5)
EOS%: 1.7 % (ref 0.0–7.0)
HCT: 40.2 % (ref 38.4–49.9)
HEMOGLOBIN: 13.1 g/dL (ref 13.0–17.1)
LYMPH#: 27.8 10*3/uL — AB (ref 0.9–3.3)
LYMPH%: 86 % — ABNORMAL HIGH (ref 14.0–49.0)
MCH: 33.2 pg (ref 27.2–33.4)
MCHC: 32.7 g/dL (ref 32.0–36.0)
MCV: 101.7 fL — AB (ref 79.3–98.0)
MONO#: 0.2 10*3/uL (ref 0.1–0.9)
MONO%: 0.7 % (ref 0.0–14.0)
NEUT%: 11.2 % — ABNORMAL LOW (ref 39.0–75.0)
NEUTROS ABS: 3.6 10*3/uL (ref 1.5–6.5)
Platelets: 269 10*3/uL (ref 140–400)
RBC: 3.95 10*6/uL — ABNORMAL LOW (ref 4.20–5.82)
RDW: 20.3 % — AB (ref 11.0–14.6)
WBC: 32.3 10*3/uL — ABNORMAL HIGH (ref 4.0–10.3)

## 2014-10-23 LAB — COMPREHENSIVE METABOLIC PANEL (CC13)
ALBUMIN: 3.9 g/dL (ref 3.5–5.0)
ALK PHOS: 59 U/L (ref 40–150)
ALT: 25 U/L (ref 0–55)
AST: 35 U/L — ABNORMAL HIGH (ref 5–34)
Anion Gap: 10 mEq/L (ref 3–11)
BUN: 18.5 mg/dL (ref 7.0–26.0)
CHLORIDE: 107 meq/L (ref 98–109)
CO2: 22 mEq/L (ref 22–29)
Calcium: 9.1 mg/dL (ref 8.4–10.4)
Creatinine: 1.1 mg/dL (ref 0.7–1.3)
EGFR: 60 mL/min/{1.73_m2} — AB (ref >90)
GLUCOSE: 132 mg/dL (ref 70–140)
POTASSIUM: 4.7 meq/L (ref 3.5–5.1)
Sodium: 140 mEq/L (ref 136–145)
Total Bilirubin: 0.82 mg/dL (ref 0.20–1.20)
Total Protein: 6.6 g/dL (ref 6.4–8.3)

## 2014-10-23 LAB — TECHNOLOGIST REVIEW

## 2014-10-23 NOTE — Telephone Encounter (Signed)
per pof to sch pt appt-gave pt copy of sch °

## 2014-10-23 NOTE — Progress Notes (Signed)
Hematology and Oncology Follow Up Visit  Oscar Baker [id number] 11-12-1932 81 y.o. 10/23/2014 8:40 AM [name], MDR[place], [name], MD   Principle Diagnosis: 81 year old gentleman with CLL diagnosed in August 2015. He presented with leukocytosis and mild lymphadenopathy.  Current therapy: Observation and surveillance:  Interim History:  Oscar Baker presents today for a follow-up visit. Since the last visit, he reports no issues. He does not report any constitutional symptoms of fevers or chills or sweats. He does not report any painful adenopathy or change in his performance status. He continues to perform activities of daily living without any major decline. His cardiac status is also stable with a pacemaker in place.   He does not report any headaches or blurry vision or syncope. He does not report any chest pain, palpitation or shortness of breath. He does not report any nausea, vomiting, abdominal pain. He does not report any frequency urgency or hesitancy. He does not report any skeletal complaints. Rest of his review of systems unremarkable.  Medications: I have reviewed the patient's current medications.  Current Outpatient Prescriptions  Medication Sig Dispense Refill  . albuterol (PROVENTIL HFA;VENTOLIN HFA) 108 (90 BASE) MCG/ACT inhaler Inhale 2 puffs into the lungs every 4 (four) hours as needed for wheezing or shortness of breath.    [name] amLODipine (NORVASC) 10 MG tablet Take 10 mg by mouth at bedtime.   2  . aspirin 81 MG tablet Take 81 mg by mouth at bedtime.     [name] atorvastatin (LIPITOR) 20 MG tablet Take 20 mg by mouth at bedtime.  2  . cetirizine (ZYRTEC) 10 MG tablet Take 10 mg by mouth at bedtime.    [name] dextromethorphan-guaiFENesin (MUCINEX DM) 30-600 MG per 12 hr tablet Take 1 tablet by mouth every 12 (twelve) hours as needed for cough.    [name] Calcium (STOOL SOFTENER PO) Per bottle as needed for constipation    . famotidine (PEPCID) 20 MG tablet Take 20 mg by mouth at  bedtime.    . fenofibrate micronized (LOFIBRA) 200 MG capsule Take 1 capsule (200 mg total) by mouth daily before breakfast. 90 capsule 3  . glipiZIDE (GLUCOTROL XL) 10 MG 24 hr tablet Take 10 mg by mouth daily with breakfast.    . metFORMIN (GLUCOPHAGE) 1000 MG tablet Take 1,000 mg by mouth 2 (two) times daily with a meal.      . montelukast (SINGULAIR) 10 MG tablet Take 1 tablet (10 mg total) by mouth at bedtime. 30 tablet 11  . multivitamin (THERAGRAN) per tablet Take 1 tablet by mouth daily.      . pantoprazole (PROTONIX) 40 MG tablet Take 40 mg by mouth daily before breakfast.    . sitaGLIPtan (JANUVIA) 100 MG tablet Take 100 mg by mouth at bedtime.     . traMADol (ULTRAM) 50 MG tablet 1-2 every 4 hours as needed for cough/pain    . triamcinolone cream (KENALOG) 0.5 % Use as needed for rash    . vitamin B-12 (CYANOCOBALAMIN) 1000 MCG tablet Take 1,000 mcg by mouth daily.     [name] warfarin (COUMADIN) 5 MG tablet TAKE 1 TABLET BY MOUTH DAILY 90 tablet 1   No current facility-administered medications for this visit.     Allergies:  Allergies  Allergen Reactions  . Altace [Ramipril]     "throat felt like had a knot in it"  . Codeine Nausea And Vomiting    Nausea and vomiting   . Simvastatin Other (See Comments)  Leg aches    Past Medical History, Surgical history, Social history, and Family History were reviewed and updated.   Physical Exam: Blood pressure 151/68, pulse 72, temperature 97.9 F (36.6 C), temperature source Oral, resp. rate 18, height 5' 10.5" (1.791 m), weight 229 lb 3.2 oz (103.964 kg), SpO2 96 %. ECOG: 0 General appearance: alert and cooperative Head: Normocephalic, without obvious abnormality Neck: no adenopathy Lymph nodes: Cervical, supraclavicular, and axillary nodes normal. Heart:regular rate and rhythm, S1, S2 normal, no murmur, click, rub or gallop Lung:chest clear, no wheezing, rales, normal symmetric air entry.  Abdomin: soft, non-tender, without  masses or organomegaly EXT:no erythema, induration, or nodules   Lab Results: Lab Results  Component Value Date   WBC 32.3* 10/23/2014   HGB 13.1 10/23/2014   HCT 40.2 10/23/2014   MCV 101.7* 10/23/2014   PLT 269 10/23/2014     Chemistry      Component Value Date/Time   NA 140 04/17/2014 0817   NA 140 02/08/2014 0700   K 4.7 04/17/2014 0817   K 4.7 02/08/2014 0700   CL 106 02/08/2014 0700   CO2 23 04/17/2014 0817   CO2 23 02/08/2014 0700   BUN 15.8 04/17/2014 0817   BUN 19 02/08/2014 0700   CREATININE 1.1 04/17/2014 0817   CREATININE 1.16 02/08/2014 0700      Component Value Date/Time   CALCIUM 9.5 04/17/2014 0817   CALCIUM 9.5 02/08/2014 0700   ALKPHOS 57 04/17/2014 0817   ALKPHOS 35* 09/06/2012 0742   AST 23 04/17/2014 0817   AST 26 09/06/2012 0742   ALT 19 04/17/2014 0817   ALT 22 09/06/2012 0742   BILITOT 0.80 04/17/2014 0817   BILITOT 0.7 09/06/2012 0742      Impression and Plan:  81 year old gentleman with: 1. Lymphocytosis  with flow cytometry confirming the presence of CLL. His CT scan results on 04/17/2014 showed very small lymphadenopathy and no indication for treatment. The natural course of this disease reviewed again today with Mr. Drewes.   He currently has no indications to start treatment at this time. He does not report any painful or bulky adenopathy. He does not report any constitutional symptoms. The plan is to continue with active surveillance and repeat laboratory testing and clinical visit in 6 months. Sooner if he develops any symptoms. All his questions were answered today.  2. Follow-up: Will be in 6 months sooner if needed to.   [name], MD 5/3/20168:40 AM

## 2014-10-30 ENCOUNTER — Ambulatory Visit (INDEPENDENT_AMBULATORY_CARE_PROVIDER_SITE_OTHER): Payer: Medicare Other | Admitting: Internal Medicine

## 2014-10-30 ENCOUNTER — Encounter: Payer: Self-pay | Admitting: Internal Medicine

## 2014-10-30 VITALS — BP 126/82 | HR 68 | Ht 70.5 in | Wt 227.6 lb

## 2014-10-30 DIAGNOSIS — J45991 Cough variant asthma: Secondary | ICD-10-CM | POA: Diagnosis not present

## 2014-10-30 MED ORDER — PREDNISONE 10 MG PO TABS: ORAL_TABLET | ORAL | Status: DC

## 2014-10-30 NOTE — Patient Instructions (Addendum)
Prednisone 10 mg take  4 each am x 2 days,   2 each am x 2 days,  1 each am x 2 days and stop   GERD (REFLUX)  is an extremely common cause of respiratory symptoms just like yours , many times with no obvious heartburn at all.    It can be treated with medication, but also with lifestyle changes including using bed blocks  avoidance of late meals, excessive alcohol, smoking cessation, and avoid fatty foods, chocolate, peppermint, colas, red wine, and acidic juices such as orange juice.  NO MINT OR MENTHOL PRODUCTS SO NO COUGH DROPS  USE SUGARLESS CANDY INSTEAD (Jolley ranchers or Stover's or Life Savers) or even ice chips will also do - the key is to swallow to prevent all throat clearing. NO OIL BASED VITAMINS - use powdered substitutes.  See calendar for specific medication instructions and bring it back for each and every office visit for every healthcare provider you see.  Without it,  you may not receive the best quality medical care that we feel you deserve.  You will note that the calendar groups together  your maintenance  medications that are timed at particular times of the day.  Think of this as your checklist for what your doctor has instructed you to do until your next evaluation to see what benefit  there is  to staying on a consistent group of medications intended to keep you well.  The other group at the bottom is entirely up to you to use as you see fit  for specific symptoms that may arise between visits that require you to treat them on an as needed basis.  Think of this as your action plan or "what if" list.   Separating the top medications from the bottom group is fundamental to providing you adequate care going forward.

## 2014-10-30 NOTE — Progress Notes (Signed)
Subjective:    Patient ID: Oscar Baker, male    DOB: Jun 19, 1933,   MRN: [id number]   Brief patient profile:  89 yowm with CLL / never regular cig smoker/ stopped pipe in 1980 assoc with  some cough which completely  resolved  p quit  then summer  of 2015 gradual onset sensation "like something stuck in throat" > stopped ACEi > referred to [facility] seen in Jan 2016 with dx of bronchitis and referred to pulmonary by Dr Virgina Jock and first seen 07/27/2014    History of Present Illness  07/27/2014 1st [facility] office visit/ Nargis Abrams   Chief Complaint  Patient presents with  . Pulmonary Consult    Referred by Dr. Virgina Jock. Pt c/o cough x 3-4 months- prod with moderate green sputum. He states cough is worse first thing in the am and then in the evening.   indolent onset persistent daily cough  X summer 2015 "when feet hit the floor each am"/ and worse at hs and after meals   / no better with hfa / d/c acei/ multile abx > no better and not impressed that saba helps either  rec Augmentin 875 mg take one pill twice daily  X 10 days - take at breakfast and supper with large glass of water.  It would help reduce the usual side effects (diarrhea and yeast infections) if you ate cultured yogurt at lunch.  - Note: your coumadin may need to be adjusted on this  medication Prednisone 10 mg take  4 each am x 2 days,   2 each am x 2 days,  1 each am x 2 days and stop  Pantoprazole (protonix) 40 mg   Take 30-60 min before first meal of the day and Pepcid 20 mg one bedtime until return to office - this is the best way to tell whether stomach acid is contributing to your problem.   for cough > mucinex dm 1200 mg every 12 hours and supplement with tramadol 50 mg 1 every 4 hours if needed  GERD  Diet  Late add consider trial of ICS next    08/10/2014 f/u ov/Jalaila Caradonna re: prob cough variant asthma  Chief Complaint  Patient presents with  . Follow-up    Pt states that his cough is much improved. He states he is still  "spitting up" clear sputum.   did not maintain on rx for gerd Cough is worse p breakfast > white mucus but much better than it was Not limited by breathing from desired activities   >add singulair   09/11/2014 Follow up : Prob Cough Variant Asthma  Returns for a 1 month follow up for Cough.  Has been placed on aggressive regimen of GERD/AR prevention .  Cough is resolved. He is feeling so much better.  Singulair was added last ov.  Prev RAST test showed multiple positive allergens with elevated IgE.    10/30/2014 f/u ov/Aphrodite Harpenau re: ? Cough variant asthma  Chief Complaint  Patient presents with  . Follow-up    Pt c/o increased cough x 2 days- prod with light yellow sputum.  His throat feels sore.  He states his breathing is still doing well.    was doing great x months  but outside a lot over May 7 weekend then developed knot in throat and urge to clear it, did not use action plan at bottom of calendar and since last ov added back fish oil.  Cough urge worse at hs / took tramadol  and improved and able to sleep  No obvious day to day or daytime variabilty or assoc sob  or cp or chest tightness, subjective wheeze overt sinus or hb symptoms. No unusual exp hx or h/o childhood pna/ asthma or knowledge of premature birth.  Sleeping ok without nocturnal  or early am exacerbation  of respiratory  c/o's or need for noct saba. Also denies any obvious fluctuation of symptoms with weather or environmental changes or other aggravating or alleviating factors except as outlined above   Current Medications, Allergies, Complete Past Medical History, Past Surgical History, Family History, and Social History were reviewed in [employer] record.  ROS  The following are not active complaints unless bolded sore throat, dysphagia, dental problems, itching, sneezing,  nasal congestion or excess/ purulent secretions, ear ache,   fever, chills, sweats, unintended wt loss, pleuritic or exertional  cp, hemoptysis,  orthopnea pnd or leg swelling, presyncope, palpitations, heartburn, abdominal pain, anorexia, nausea, vomiting, diarrhea  or change in bowel or urinary habits, change in stools or urine, dysuria,hematuria,  rash, arthralgias, visual complaints, headache, numbness weakness or ataxia or problems with walking or coordination,  change in mood/affect or memory.               Objective:   Physical Exam  amb pleasant wm nad   08/10/2014        227>222 09/11/2014 > 10/30/2014  228    Vital signs reviewed  HEENT: nl dentition, turbinates, and orophanx. Nl external ear canals without cough reflex   NECK :  without JVD/Nodes/TM/ nl carotid upstrokes bilaterally   LUNGS: no acc muscle use, CTA w/ no wheezing /cough or psuedowheeze    CV:  RRR  no s3 or murmur or increase in P2, 1+ pitting  edema both lower ext  ABD:  soft and nontender with nl excursion in the supine position. No bruits or organomegaly, bowel sounds nl  MS:  warm without deformities, calf tenderness, cyanosis or clubbing  SKIN: warm and dry without lesions    NEURO:  alert, approp, no deficits    CT chest:  04/17/14  I personally reviewed images and agree with radiology impression as follows:   1. Multiple small axillary lymph nodes. 2. Small mediastinal lymph nodes. 3. Single mildly enlarged left supra / infraclavicular node.       07/27/14 Labs ordered Cbc with diff >   eos 0.3        Assessment & Plan:

## 2014-10-30 NOTE — Assessment & Plan Note (Signed)
-   Allergy profile  07/27/14 Eos 0.3  IgE  391 > Pos RAST dust/ mold/treees/ grasses - trial of singulair 08/10/14 >>> -med calendar 09/11/2014    Doing better until prolonged outdoor exp but main symptoms are upper airway and not really typical for cough variant asthma but either way his symptoms seem to be breaking thru singulair (albeit after he restarted fish oil against my recs)  I had an extended discussion with the patient reviewing all relevant studies completed to date and  lasting 15 to 20 minutes of a 25 minute visit on the following ongoing concerns:  The standardized cough guidelines published in Chest by Lissa Morales in 2006 are still the best available and consist of a multiple step process (up to 12!) , not a single office visit,  and are intended  to address this problem logically,  with an alogrithm dependent on response to empiric treatment at  each progressive step  to determine a specific diagnosis with  minimal addtional testing needed. Therefore if adherence is an issue or can't be accurately verified,  it's very unlikely the standard evaluation and treatment will be successful here.    Furthermore, response to therapy (other than acute cough suppression, which should only be used short term with avoidance of narcotic containing cough syrups if possible), can be a gradual process for which the patient may perceive immediate benefit.  Unlike going to an eye doctor where the best perscription is almost always the first one and is immediately effective, this is almost never the case in the management of chronic cough syndromes. Therefore the patient needs to commit up front to consistently adhere to recommendations  for up to 6 weeks of therapy directed at the likely underlying problem(s) before the response can be reasonably evaluated.      Each maintenance medication was reviewed in detail including most importantly the difference between maintenance and as needed and under what  circumstances the prns are to be used. This was done in the context of a medication calendar review which provided the patient with a user-friendly unambiguous mechanism for medication administration and reconciliation and provides an action plan for all active problems. It is critical that this be shown to every doctor  for modification during the office visit if necessary so the patient can use it as a working document.  For now no change rx but rec Prednisone 10 mg take  4 each am x 2 days,   2 each am x 2 days,  1 each am x 2 days and stop and regroup in 6 weeks

## 2014-11-01 ENCOUNTER — Other Ambulatory Visit: Payer: Self-pay | Admitting: Internal Medicine

## 2014-11-01 MED ORDER — PANTOPRAZOLE SODIUM 40 MG PO TBEC: 40.0000 mg | DELAYED_RELEASE_TABLET | Freq: Every day | ORAL | Status: DC

## 2014-11-20 ENCOUNTER — Ambulatory Visit (INDEPENDENT_AMBULATORY_CARE_PROVIDER_SITE_OTHER): Payer: Medicare Other | Admitting: *Deleted

## 2014-11-20 ENCOUNTER — Ambulatory Visit (INDEPENDENT_AMBULATORY_CARE_PROVIDER_SITE_OTHER): Payer: Medicare Other | Admitting: Pharmacist

## 2014-11-20 DIAGNOSIS — I4892 Unspecified atrial flutter: Secondary | ICD-10-CM

## 2014-11-20 DIAGNOSIS — I4891 Unspecified atrial fibrillation: Secondary | ICD-10-CM

## 2014-11-20 DIAGNOSIS — N393 Stress incontinence (female) (male): Secondary | ICD-10-CM | POA: Diagnosis not present

## 2014-11-20 DIAGNOSIS — I495 Sick sinus syndrome: Secondary | ICD-10-CM | POA: Diagnosis not present

## 2014-11-20 DIAGNOSIS — N3942 Incontinence without sensory awareness: Secondary | ICD-10-CM | POA: Diagnosis not present

## 2014-11-20 LAB — POCT INR: INR: 2

## 2014-11-20 NOTE — Progress Notes (Signed)
Remote pacemaker transmission.   

## 2014-11-22 ENCOUNTER — Other Ambulatory Visit: Payer: Self-pay | Admitting: Internal Medicine

## 2014-11-22 MED ORDER — TRAMADOL HCL 50 MG PO TABS: 50.0000 mg | ORAL_TABLET | ORAL | Status: DC | PRN

## 2014-11-25 LAB — CUP PACEART REMOTE DEVICE CHECK
Brady Statistic RV Percent Paced: 95 %
Lead Channel Setting Pacing Pulse Width: 0.4 ms
MDC IDC SESS DTM: 20160605144832
MDC IDC SET LEADCHNL RV PACING AMPLITUDE: 2.4 V
Pulse Gen Model: 394971
Pulse Gen Serial Number: 68372291

## 2014-11-30 ENCOUNTER — Encounter: Payer: Self-pay | Admitting: Cardiology

## 2014-12-04 ENCOUNTER — Encounter: Payer: Self-pay | Admitting: Internal Medicine

## 2014-12-11 ENCOUNTER — Ambulatory Visit (INDEPENDENT_AMBULATORY_CARE_PROVIDER_SITE_OTHER): Payer: Medicare Other | Admitting: Internal Medicine

## 2014-12-11 ENCOUNTER — Encounter: Payer: Self-pay | Admitting: Internal Medicine

## 2014-12-11 VITALS — BP 124/76 | HR 66 | Ht 70.0 in | Wt 226.4 lb

## 2014-12-11 DIAGNOSIS — J45991 Cough variant asthma: Secondary | ICD-10-CM | POA: Diagnosis not present

## 2014-12-11 DIAGNOSIS — R05 Cough: Secondary | ICD-10-CM

## 2014-12-11 DIAGNOSIS — J3489 Other specified disorders of nose and nasal sinuses: Secondary | ICD-10-CM | POA: Insufficient documentation

## 2014-12-11 DIAGNOSIS — R058 Other specified cough: Secondary | ICD-10-CM | POA: Insufficient documentation

## 2014-12-11 DIAGNOSIS — E669 Obesity, unspecified: Secondary | ICD-10-CM | POA: Diagnosis not present

## 2014-12-11 MED ORDER — GABAPENTIN 100 MG PO CAPS: 100.0000 mg | ORAL_CAPSULE | Freq: Four times a day (QID) | ORAL | Status: DC

## 2014-12-11 NOTE — Progress Notes (Signed)
Subjective:    Patient ID: Oscar Baker, male    DOB: June 13, 1933,   MRN: [id number]   Brief patient profile:  50 yowm with CLL / never regular cig smoker/ stopped pipe in 1980 assoc with  some cough which completely  resolved  p quit  then summer  of 2015 gradual onset sensation "like something stuck in throat" > stopped ACEi > referred to [facility] seen in Jan 2016 with neg w/u and  dx of bronchitis and referred to pulmonary by Dr Virgina Jock and first seen 07/27/2014    History of Present Illness  07/27/2014 1st [facility] office visit/ Wert   Chief Complaint  Patient presents with  . Pulmonary Consult    Referred by Dr. Virgina Jock. Pt c/o cough x 3-4 months- prod with moderate green sputum. He states cough is worse first thing in the am and then in the evening.   indolent onset persistent daily cough  X summer 2015 "when feet hit the floor each am"/ and worse at hs and after meals   / no better with hfa / d/c acei/ multile abx > no better and not impressed that saba helps either  rec Augmentin 875 mg take one pill twice daily  X 10 days - take at breakfast and supper with large glass of water.  It would help reduce the usual side effects (diarrhea and yeast infections) if you ate cultured yogurt at lunch.  - Note: your coumadin may need to be adjusted on this  medication Prednisone 10 mg take  4 each am x 2 days,   2 each am x 2 days,  1 each am x 2 days and stop  Pantoprazole (protonix) 40 mg   Take 30-60 min before first meal of the day and Pepcid 20 mg one bedtime until return to office - this is the best way to tell whether stomach acid is contributing to your problem.   for cough > mucinex dm 1200 mg every 12 hours and supplement with tramadol 50 mg 1 every 4 hours if needed  GERD  Diet  Late add consider trial of ICS next    08/10/2014 f/u ov/Wert re: prob cough variant asthma  Chief Complaint  Patient presents with  . Follow-up    Pt states that his cough is much improved. He states  he is still "spitting up" clear sputum.   did not maintain on rx for gerd Cough is worse p breakfast > white mucus but much better than it was Not limited by breathing from desired activities   >add singulair     10/30/2014 f/u ov/Wert re: ? Cough variant asthma  Chief Complaint  Patient presents with  . Follow-up    Pt c/o increased cough x 2 days- prod with light yellow sputum.  His throat feels sore.  He states his breathing is still doing well.    was doing great x months  but outside a lot over May 7 weekend then developed knot in throat and urge to clear it, did not use action plan at bottom of calendar and since last ov added back fish oil.  Cough urge worse at hs / took tramadol and improved and able to sleep rec Prednisone 10 mg take  4 each am x 2 days,   2 each am x 2 days,  1 each am x 2 days and stop  GERD  Diet    12/11/2014 f/u ov/Wert re: urge to clear throat esp p eat  never completely  resolved since late summer / fall 2015  Chief Complaint  Patient presents with  . Follow-up    Pt states his cough is slightly improved. Still coughing up large amounts of sputum- white. Cough is worse in the am and after he eats.        Not a total of more than one tbsp per day / doesn't wake him / no better with saba and admits in retrospect never lost urge to clear throat since summer of 2015 when acei stopped   No obvious day to day or daytime variabilty or assoc sob  or cp or chest tightness, subjective wheeze overt sinus or hb symptoms. No unusual exp hx or h/o childhood pna/ asthma or knowledge of premature birth.  Sleeping ok without nocturnal  or early am exacerbation  of respiratory  c/o's or need for noct saba. Also denies any obvious fluctuation of symptoms with weather or environmental changes or other aggravating or alleviating factors except as outlined above   Current Medications, Allergies, Complete Past Medical History, Past Surgical History, Family History, and Social  History were reviewed in [employer] record.  ROS  The following are not active complaints unless bolded sore throat, dysphagia, dental problems, itching, sneezing,  nasal congestion or excess/ purulent secretions, ear ache,   fever, chills, sweats, unintended wt loss, pleuritic or exertional cp, hemoptysis,  orthopnea pnd or leg swelling, presyncope, palpitations, heartburn, abdominal pain, anorexia, nausea, vomiting, diarrhea  or change in bowel or urinary habits, change in stools or urine, dysuria,hematuria,  rash, arthralgias, visual complaints, headache, numbness weakness or ataxia or problems with walking or coordination,  change in mood/affect or memory.               Objective:   Physical Exam  amb pleasant wm nad   08/10/2014 227>222 09/11/2014 > 10/30/2014  228 > 12/11/2014  226    Vital signs reviewed  HEENT: nl dentition, turbinates, and orophanx. Nl external ear canals without cough reflex   NECK :  without JVD/Nodes/TM/ nl carotid upstrokes bilaterally   LUNGS: no acc muscle use, CTA w/ no wheezing /cough or psuedowheeze    CV:  RRR  no s3 or murmur or increase in P2,  trace edema both lower ext  ABD:  soft and nontender with nl excursion in the supine position. No bruits or organomegaly, bowel sounds nl  MS:  warm without deformities, calf tenderness, cyanosis or clubbing  SKIN: warm and dry without lesions    NEURO:  alert, approp, no deficits                Assessment & Plan:

## 2014-12-11 NOTE — Patient Instructions (Addendum)
GERD (REFLUX)  is an extremely common cause of respiratory symptoms just like yours , many times with no obvious heartburn at all.    It can be treated with medication, but also with lifestyle changes including elevation of the head of your bed (ideally with 6 inch  bed blocks),  Smoking cessation, avoidance of late meals, excessive alcohol, and avoid fatty foods, chocolate, peppermint, colas, red wine, and acidic juices such as orange juice.  NO MINT OR MENTHOL PRODUCTS SO NO COUGH DROPS  USE SUGARLESS CANDY INSTEAD (Jolley ranchers or Stover's or Life Savers) or even ice chips will also do - the key is to swallow to prevent all throat clearing. NO OIL BASED VITAMINS - use powdered substitutes.   Gabapentin 100 mg one four times a day   See Tammy NP 4  weeks with all your medications, even over the counter meds, separated in two separate bags, the ones you take no matter what vs the ones you stop once you feel better and take only as needed when you feel you need them.   Tammy  will generate for you a new user friendly medication calendar that will put [place] all on the same page re: your medication use.     Without this process, it simply isn't possible to assure that we are providing  your outpatient care  with  the attention to detail we feel you deserve.   If we cannot assure that you're getting that kind of care,  then we cannot manage your problem effectively from this clinic.  Once you have seen Tammy and we are sure that we're all on the same page with your medication use she will arrange follow up with me.  cxr ordered but not done, needs on return . Late add consider MCT next ov if not better and then if neg and no response to neurontin > wfu ent eval

## 2014-12-12 ENCOUNTER — Encounter: Payer: Self-pay | Admitting: Internal Medicine

## 2014-12-12 DIAGNOSIS — E669 Obesity, unspecified: Secondary | ICD-10-CM | POA: Insufficient documentation

## 2014-12-12 NOTE — Assessment & Plan Note (Signed)
-   Allergy profile  07/27/14 Eos 0.3  IgE  391 > Pos RAST dust/ mold/treees/ grasses - trial of singulair 08/10/14 >>>  Not even completely better on pred/ not responding to saba so rec shift gears and rx as UACS > see a/p

## 2014-12-12 NOTE — Assessment & Plan Note (Signed)
The most common causes of chronic cough in immunocompetent adults include the following: upper airway cough syndrome (UACS), previously referred to as postnasal drip syndrome (PNDS), which is caused by variety of rhinosinus conditions; (2) asthma; (3) GERD; (4) chronic bronchitis from cigarette smoking or other inhaled environmental irritants; (5) nonasthmatic eosinophilic bronchitis; and (6) bronchiectasis.   These conditions, singly or in combination, have accounted for up to 94% of the causes of chronic cough in prospective studies.   Other conditions have constituted no >6% of the causes in prospective studies These have included bronchogenic carcinoma, chronic interstitial pneumonia, sarcoidosis, left ventricular failure, ACEI-induced cough, and aspiration from a condition associated with pharyngeal dysfunction.    Chronic cough is often simultaneously caused by more than one condition. A single cause has been found from 38 to 82% of the time, multiple causes from 18 to 62%. Multiply caused cough has been the result of three diseases up to 42% of the time.       Based on hx and exam, this is most likely:  Classic Upper airway cough syndrome, so named because it's frequently impossible to sort out how much is  CR/sinusitis with freq throat clearing (which can be related to primary GERD)   vs  causing  secondary (" extra esophageal")  GERD from wide swings in gastric pressure that occur with throat clearing, often  promoting self use of mint and menthol lozenges that reduce the lower esophageal sphincter tone and exacerbate the problem further in a cyclical fashion.   These are the same pts (now being labeled as having "irritable larynx syndrome" by some cough centers) who not infrequently have a history of having failed to tolerate ace inhibitors,  dry powder inhalers or biphosphonates or report having atypical reflux symptoms that don't respond to standard doses of PPI , and are easily confused as  having aecopd or asthma flares by even experienced allergists/ pulmonologists.   The first step is to contineu to maximize acid suppression and trial of neurontin  then regroup   I had an extended discussion with the patient reviewing all relevant studies completed to date and  lasting 15 to 20 minutes of a 25 minute visit on the following ongoing concerns: - The standardized cough guidelines published in Chest by Lissa Morales in 2006 are still the best available and consist of a multiple step process (up to 12!) , not a single office visit,  and are intended  to address this problem logically,  with an alogrithm dependent on response to empiric treatment at  each progressive step  to determine a specific diagnosis with  minimal addtional testing needed. Therefore if adherence is an issue or can't be accurately verified,  it's very unlikely the standard evaluation and treatment will be successful here.    Furthermore, response to therapy (other than acute cough suppression, which should only be used short term with avoidance of narcotic containing cough syrups if possible), can be a gradual process for which the patient may perceive immediate benefit.  Unlike going to an eye doctor where the best perscription is almost always the first one and is immediately effective, this is almost never the case in the management of chronic cough syndromes. Therefore the patient needs to commit up front to consistently adhere to recommendations  for up to 6 weeks of therapy directed at the likely underlying problem(s) before the response can be reasonably evaluated.  - trust but verify next step : then consider Methacholine challenge to sort  out  Each maintenance medication was reviewed in detail including most importantly the difference between maintenance and as needed and under what circumstances the prns are to be used.  Please see instructions for details which were reviewed in writing and the patient given a  copy.

## 2014-12-12 NOTE — Assessment & Plan Note (Signed)
Body mass index is 32.48 kg/(m^2).   Needs to get in and stay in neg cal bal > defer f/u to primary care/ no tsh in EPIC

## 2014-12-17 ENCOUNTER — Other Ambulatory Visit: Payer: Self-pay

## 2014-12-26 DIAGNOSIS — N393 Stress incontinence (female) (male): Secondary | ICD-10-CM | POA: Diagnosis not present

## 2014-12-26 DIAGNOSIS — N3942 Incontinence without sensory awareness: Secondary | ICD-10-CM | POA: Diagnosis not present

## 2014-12-28 ENCOUNTER — Ambulatory Visit (INDEPENDENT_AMBULATORY_CARE_PROVIDER_SITE_OTHER): Payer: Medicare Other | Admitting: *Deleted

## 2014-12-28 DIAGNOSIS — I4891 Unspecified atrial fibrillation: Secondary | ICD-10-CM

## 2014-12-28 DIAGNOSIS — I4892 Unspecified atrial flutter: Secondary | ICD-10-CM

## 2014-12-28 LAB — POCT INR: INR: 2.2

## 2015-01-08 ENCOUNTER — Ambulatory Visit (INDEPENDENT_AMBULATORY_CARE_PROVIDER_SITE_OTHER)
Admission: RE | Admit: 2015-01-08 | Discharge: 2015-01-08 | Disposition: A | Discharge status: Home / Self Care | Payer: Medicare Other | Source: Ambulatory Visit | Attending: Adult Health | Admitting: Adult Health

## 2015-01-08 ENCOUNTER — Ambulatory Visit (INDEPENDENT_AMBULATORY_CARE_PROVIDER_SITE_OTHER): Payer: Medicare Other | Admitting: Adult Health

## 2015-01-08 ENCOUNTER — Encounter: Payer: Self-pay | Admitting: Adult Health

## 2015-01-08 VITALS — BP 148/80 | HR 66 | Temp 97.6°F | Ht 70.0 in | Wt 225.6 lb

## 2015-01-08 DIAGNOSIS — R05 Cough: Secondary | ICD-10-CM | POA: Diagnosis not present

## 2015-01-08 DIAGNOSIS — R059 Cough, unspecified: Secondary | ICD-10-CM

## 2015-01-08 DIAGNOSIS — R058 Other specified cough: Secondary | ICD-10-CM

## 2015-01-08 DIAGNOSIS — J45991 Cough variant asthma: Secondary | ICD-10-CM | POA: Diagnosis not present

## 2015-01-08 NOTE — Assessment & Plan Note (Signed)
May stop gabapentin .  Patient's medications were reviewed today and patient education was given. Computerized medication calendar was adjusted/completed  Cont w/ prevention tx of AR /GERD rx.

## 2015-01-08 NOTE — Patient Instructions (Addendum)
Chest xray today .  Follow med calendar closely and bring to each visit.  follow up Dr. Melvyn Novas  In 3 months and As needed

## 2015-01-08 NOTE — Progress Notes (Signed)
Subjective:    Patient ID: Oscar Baker, male    DOB: Aug 11, 1932,   MRN: [id number]   Brief patient profile:  64 yowm with CLL / never regular cig smoker/ stopped pipe in 1980 assoc with  some cough which completely  resolved  p quit  then summer  of 2015 gradual onset sensation "like something stuck in throat" > stopped ACEi > referred to [facility] seen in Jan 2016 with neg w/u and  dx of bronchitis and referred to pulmonary by Dr Virgina Jock and first seen 07/27/2014    History of Present Illness  07/27/2014 1st [facility] office visit/ Wert   Chief Complaint  Patient presents with  . Pulmonary Consult    Referred by Dr. Virgina Jock. Pt c/o cough x 3-4 months- prod with moderate green sputum. He states cough is worse first thing in the am and then in the evening.   indolent onset persistent daily cough  X summer 2015 "when feet hit the floor each am"/ and worse at hs and after meals   / no better with hfa / d/c acei/ multile abx > no better and not impressed that saba helps either  rec Augmentin 875 mg take one pill twice daily  X 10 days - take at breakfast and supper with large glass of water.  It would help reduce the usual side effects (diarrhea and yeast infections) if you ate cultured yogurt at lunch.  - Note: your coumadin may need to be adjusted on this  medication Prednisone 10 mg take  4 each am x 2 days,   2 each am x 2 days,  1 each am x 2 days and stop  Pantoprazole (protonix) 40 mg   Take 30-60 min before first meal of the day and Pepcid 20 mg one bedtime until return to office - this is the best way to tell whether stomach acid is contributing to your problem.   for cough > mucinex dm 1200 mg every 12 hours and supplement with tramadol 50 mg 1 every 4 hours if needed  GERD  Diet  Late add consider trial of ICS next    08/10/2014 f/u ov/Wert re: prob cough variant asthma  Chief Complaint  Patient presents with  . Follow-up    Pt states that his cough is much improved. He states  he is still "spitting up" clear sputum.   did not maintain on rx for gerd Cough is worse p breakfast > white mucus but much better than it was Not limited by breathing from desired activities   >add singulair     10/30/2014 f/u ov/Wert re: ? Cough variant asthma  Chief Complaint  Patient presents with  . Follow-up    Pt c/o increased cough x 2 days- prod with light yellow sputum.  His throat feels sore.  He states his breathing is still doing well.    was doing great x months  but outside a lot over May 7 weekend then developed knot in throat and urge to clear it, did not use action plan at bottom of calendar and since last ov added back fish oil.  Cough urge worse at hs / took tramadol and improved and able to sleep rec Prednisone 10 mg take  4 each am x 2 days,   2 each am x 2 days,  1 each am x 2 days and stop  GERD  Diet    12/11/2014 f/u ov/Wert re: urge to clear throat esp p eat  never completely  resolved since late summer / fall 2015  Chief Complaint  Patient presents with  . Follow-up    Pt states his cough is slightly improved. Still coughing up large amounts of sputum- white. Cough is worse in the am and after he eats.        Not a total of more than one tbsp per day / doesn't wake him / no better with saba and admits in retrospect never lost urge to clear throat since summer of 2015 when acei stopped  >>gabapentin rx   01/08/2015 Follow up : Cough/AR/congestion , former smoker -Pipe , CLL  Returns for follow up and med review . Has been seen for cough /congestion for years,  Says congestion is unchanged from last ov  Gabapentin was added last ov for cough .  Spirometry today was normal -with FEV1 82% , ratio 76, FVC 81%.  Using mucinex dm and zyrtec Gabapentin did not help.  Does not cough that much , mainly thick mucus that sticks in throat.  Says he can live with it and does not want to keep taking meds.  No chest pain , hemoptysis , wt loss, fever, sinus pain , edema  or orthopnea.  Last CT chest 03/2014 , lung parenchyma w/ nad.  Allergy profile was high RAST and IgE. eos on cbc ok.  Reviewed meds and organized them into a med calendar with pt education  Appears to be taking correctly .   Current Medications, Allergies, Complete Past Medical History, Past Surgical History, Family History, and Social History were reviewed in [employer] record.  ROS  The following are not active complaints unless bolded sore throat, dysphagia, dental problems, itching, sneezing,  nasal congestion or excess/ purulent secretions, ear ache,   fever, chills, sweats, unintended wt loss, pleuritic or exertional cp, hemoptysis,  orthopnea pnd or leg swelling, presyncope, palpitations, heartburn, abdominal pain, anorexia, nausea, vomiting, diarrhea  or change in bowel or urinary habits, change in stools or urine, dysuria,hematuria,  rash, arthralgias, visual complaints, headache, numbness weakness or ataxia or problems with walking or coordination,  change in mood/affect or memory.               Objective:   Physical Exam  amb pleasant wm nad   08/10/2014 227>222 09/11/2014 > 10/30/2014  228 > 12/11/2014  226 >225 01/08/2015  Vital signs reviewed  HEENT: nl dentition, turbinates, and orophanx. Nl external ear canals without cough reflex   NECK :  without JVD/Nodes/TM/ nl carotid upstrokes bilaterally   LUNGS: no acc muscle use, CTA w/ no wheezing /cough or psuedowheeze    CV:  RRR  no s3 or murmur or increase in P2,  trace edema both lower ext  ABD:  soft and nontender with nl excursion in the supine position. No bruits or organomegaly, bowel sounds nl  MS:  warm without deformities, calf tenderness, cyanosis or clubbing  SKIN: warm and dry without lesions    NEURO:  alert, approp, no deficits                Assessment & Plan:

## 2015-01-08 NOTE — Assessment & Plan Note (Signed)
Controlled without flare , suspect AR /drainage (positive RAST and high IgE ) contributing to ongoing mucus.  Cont on mucinex dm and zyrtec, singulair  Normal Spirometry today with no airway obstruction noted.   Plan  Cont on current regimen.

## 2015-01-08 NOTE — Progress Notes (Signed)
Chart and office note reviewed in detail along with available xrays/ labs > agree with a/p as outlined  

## 2015-01-11 NOTE — Progress Notes (Signed)
Quick Note:  lmtcb for pt. ______ 

## 2015-01-14 ENCOUNTER — Telehealth: Payer: Self-pay | Admitting: Adult Health

## 2015-01-14 NOTE — Telephone Encounter (Signed)
Per CXR result note:  Result Note     cxr ok,     No PNA or acute process    Cont w/ ov rec     Please contact office for sooner follow up if symptoms do not improve or worsen or seek emergency care    ---  ATC PT fast busy signal [facility]

## 2015-01-14 NOTE — Telephone Encounter (Signed)
Patient notified of CXR results. Patient says that he is still spitting up mucus, clear.  He says it is usually after he eats.  He wanted to let Tammy know.  FYI - TP

## 2015-01-15 NOTE — Telephone Encounter (Signed)
lmtcb X1 for pt  

## 2015-01-15 NOTE — Telephone Encounter (Signed)
Pt aware of recs.  Nothing further needed. 

## 2015-01-15 NOTE — Telephone Encounter (Signed)
Ok , cont to monitor  If worse call back  Please contact office for sooner follow up if symptoms do not improve or worsen or seek emergency care

## 2015-01-31 ENCOUNTER — Ambulatory Visit (INDEPENDENT_AMBULATORY_CARE_PROVIDER_SITE_OTHER): Payer: Medicare Other | Admitting: Internal Medicine

## 2015-01-31 VITALS — BP 132/80 | HR 72 | Ht 70.0 in | Wt 221.0 lb

## 2015-01-31 DIAGNOSIS — E669 Obesity, unspecified: Secondary | ICD-10-CM

## 2015-01-31 DIAGNOSIS — J45991 Cough variant asthma: Secondary | ICD-10-CM | POA: Diagnosis not present

## 2015-01-31 MED ORDER — MOMETASONE FURO-FORMOTEROL FUM 100-5 MCG/ACT IN AERO: 2.0000 | INHALATION_SPRAY | Freq: Two times a day (BID) | RESPIRATORY_TRACT | Status: DC

## 2015-01-31 MED ORDER — TRAMADOL HCL 50 MG PO TABS: ORAL_TABLET | ORAL | Status: DC

## 2015-01-31 MED ORDER — PREDNISONE 10 MG PO TABS: ORAL_TABLET | ORAL | Status: DC

## 2015-01-31 NOTE — Patient Instructions (Addendum)
Stop singulair and zyrtec    For drainage take chlortrimeton (chlorpheniramine) 4 mg every 4 hours available over the counter (may cause drowsiness)   Start dysmista one twice  Daily   Prednisone 10 mg take  4 each am x 2 days,   2 each am x 2 days,  1 each am x 2 days and stop   dulera 100 Take 2 puffs first thing in am and then another 2 puffs about 12 hours later.   See Tammy NP w/in 2 weeks with all your medications and your old med calendar

## 2015-01-31 NOTE — Progress Notes (Signed)
Subjective:    Patient ID: Oscar Baker, male    DOB: 1932-09-20,   MRN: [id number]   Brief patient profile:  57 yowm with CLL / never regular cig smoker/ stopped pipe in 1980 assoc with  some cough which completely  resolved  p quit  then summer  of 2015 gradual onset sensation "like something stuck in throat" > stopped ACEi > referred to [facility] seen in Jan 2016 with neg w/u and  dx of bronchitis and referred to pulmonary by Dr Virgina Jock and first seen 07/27/2014    History of Present Illness  07/27/2014 1st [facility] office visit/ Oscar Baker   Chief Complaint  Patient presents with  . Pulmonary Consult    Referred by Dr. Virgina Jock. Pt c/o cough x 3-4 months- prod with moderate green sputum. He states cough is worse first thing in the am and then in the evening.   indolent onset persistent daily cough  X summer 2015 "when feet hit the floor each am"/ and worse at hs and after meals   / no better with hfa / d/c acei/ multile abx > no better and not impressed that saba helps either  rec Augmentin 875 mg take one pill twice daily  X 10 days - take at breakfast and supper with large glass of water.  It would help reduce the usual side effects (diarrhea and yeast infections) if you ate cultured yogurt at lunch.  - Note: your coumadin may need to be adjusted on this  medication Prednisone 10 mg take  4 each am x 2 days,   2 each am x 2 days,  1 each am x 2 days and stop  Pantoprazole (protonix) 40 mg   Take 30-60 min before first meal of the day and Pepcid 20 mg one bedtime until return to office - this is the best way to tell whether stomach acid is contributing to your problem.   for cough > mucinex dm 1200 mg every 12 hours and supplement with tramadol 50 mg 1 every 4 hours if needed  GERD  Diet  Late add consider trial of ICS next    08/10/2014 f/u ov/Oscar Baker re: prob cough variant asthma  Chief Complaint  Patient presents with  . Follow-up    Pt states that his cough is much improved. He states  he is still "spitting up" clear sputum.   did not maintain on rx for gerd Cough is worse p breakfast > white mucus but much better than it was Not limited by breathing from desired activities   >add singulair     10/30/2014 f/u ov/Oscar Baker re: ? Cough variant asthma  Chief Complaint  Patient presents with  . Follow-up    Pt c/o increased cough x 2 days- prod with light yellow sputum.  His throat feels sore.  He states his breathing is still doing well.    was doing great x months  but outside a lot over May 7 weekend then developed knot in throat and urge to clear it, did not use action plan at bottom of calendar and since last ov added back fish oil.  Cough urge worse at hs / took tramadol and improved and able to sleep rec Prednisone 10 mg take  4 each am x 2 days,   2 each am x 2 days,  1 each am x 2 days and stop  GERD  Diet    12/11/2014 f/u ov/Oscar Baker re: urge to clear throat esp p eat  never completely  resolved since late summer / fall 2015  Chief Complaint  Patient presents with  . Follow-up    Pt states his cough is slightly improved. Still coughing up large amounts of sputum- white. Cough is worse in the am and after he eats.    Not a total of more than one tbsp per day / doesn't wake him / no better with saba and admits in retrospect never lost urge to clear throat since summer of 2015 when acei stopped  >>gabapentin rx   01/08/2015 Follow up : Cough/AR/congestion , former smoker -Pipe , CLL  Returns for follow up and med review . Has been seen for cough /congestion for years,  Says congestion is unchanged from last ov  Gabapentin was added last ov for cough .  Spirometry today was normal -with FEV1 82% , ratio 76, FVC 81%.  Using mucinex dm and zyrtec Gabapentin did not help.  Does not cough that much , mainly thick mucus that sticks in throat.  Says he can live with it and does not want to keep taking meds.  No chest pain , hemoptysis , wt loss, fever, sinus pain , edema or  orthopnea.  Last CT chest 03/2014 , lung parenchyma w/ nad.  Allergy profile was high RAST and IgE. eos on cbc ok.  Reviewed meds and organized them into a med calendar with pt education  Appears to be taking correctly .  rec Chest xray today .  Follow med calendar closely and bring to each visit.     01/31/2015 f/u ov/Oscar Baker re: chronic cough x one year, best rx is prednisone /no better on zyrtec or gerd rx  Chief Complaint  Patient presents with  . Cough    States that he is having continued mucus build up in his throat. At times when he coughs he does produce beige mucus.  not using med calendar/ confused with maint vs prns/ already on singulair and zyrtec   No obvious day to day or daytime variability or assoc sob or cp or chest tightness, subjective wheeze or overt sinus or hb symptoms. No unusual exp hx or h/o childhood pna/ asthma or knowledge of premature birth.  Sleeping ok without nocturnal  or early am exacerbation  of respiratory  c/o's or need for noct saba. Also denies any obvious fluctuation of symptoms with weather or environmental changes or other aggravating or alleviating factors except as outlined above   Current Medications, Allergies, Complete Past Medical History, Past Surgical History, Family History, and Social History were reviewed in [employer] record.  ROS  The following are not active complaints unless bolded sore throat, dysphagia, dental problems, itching, sneezing,  nasal congestion or excess/ purulent secretions, ear ache,   fever, chills, sweats, unintended wt loss, classically pleuritic or exertional cp, hemoptysis,  orthopnea pnd or leg swelling, presyncope, palpitations, abdominal pain, anorexia, nausea, vomiting, diarrhea  or change in bowel or bladder habits, change in stools or urine, dysuria,hematuria,  rash, arthralgias, visual complaints, headache, numbness, weakness or ataxia or problems with walking or coordination,  change in  mood/affect or memory.                        Objective:   Physical Exam  amb pleasant wm nad   08/10/2014 227>222 09/11/2014 > 10/30/2014  228 > 12/11/2014  226 >225 01/08/2015 > 01/31/2015  221  Vital signs reviewed  HEENT: nl dentition, turbinates, and orophanx.  Nl external ear canals without cough reflex   NECK :  without JVD/Nodes/TM/ nl carotid upstrokes bilaterally   LUNGS: no acc muscle use, CTA w/ no wheezing /cough or psuedowheeze    CV:  RRR  no s3 or murmur or increase in P2,  trace edema both lower ext  ABD:  soft and nontender with nl excursion in the supine position. No bruits or organomegaly, bowel sounds nl  MS:  warm without deformities, calf tenderness, cyanosis or clubbing  SKIN: warm and dry without lesions    NEURO:  alert, approp, no deficits    I personally reviewed images and agree with radiology impression as follows:  CXR:  01/08/15 1. Cardiac pacer noted in good anatomic position. Cardiomegaly with no pulmonary venous congestion. 2. No acute pulmonary infiltrate.                     Assessment & Plan:

## 2015-02-04 ENCOUNTER — Encounter: Payer: Self-pay | Admitting: Internal Medicine

## 2015-02-04 NOTE — Assessment & Plan Note (Signed)
Body mass index is 31.71 kg/(m^2).  No results found for: TSH   Contributing to gerd tendency/ doe/reviewed need  achieve and maintain neg calorie balance > defer f/u primary care including intermittently monitoring thyroid status

## 2015-02-04 NOTE — Assessment & Plan Note (Signed)
-   Allergy profile  07/27/14 Eos 0.3  IgE  391 > Pos RAST dust/ mold/treees/ grasses - trial of singulair 08/10/14 > d/c 01/31/15 as not effective  -med calendar 09/11/2014 , 01/08/2015  01/08/2015 Spirometry  normal -with FEV1 82% , ratio 76, FVC 81%   The proper method of use, as well as anticipated side effects, of a metered-dose inhaler are discussed and demonstrated to the patient. Improved effectiveness after extensive coaching during this visit to a level of approximately  75% so try dulera 100 2bid .   The prednisone response suggests cough variant asthma, eos bronchitis or eos rhinitis   So try dulera 100 2bid for the first two and dymista one bid for the last  I had an extended discussion with the patient reviewing all relevant studies completed to date and  lasting 15 to 20 minutes of a 25 minute visit    Each maintenance medication was reviewed in detail including most importantly the difference between maintenance and prns and under what circumstances the prns are to be triggered using an action plan format that is not reflected in the computer generated alphabetically organized AVS.    Please see instructions for details which were reviewed in writing and the patient given a copy highlighting the part that I personally wrote and discussed at today's ov.

## 2015-02-11 DIAGNOSIS — H903 Sensorineural hearing loss, bilateral: Secondary | ICD-10-CM | POA: Diagnosis not present

## 2015-02-11 DIAGNOSIS — H6122 Impacted cerumen, left ear: Secondary | ICD-10-CM | POA: Diagnosis not present

## 2015-02-12 ENCOUNTER — Ambulatory Visit (INDEPENDENT_AMBULATORY_CARE_PROVIDER_SITE_OTHER): Payer: Medicare Other

## 2015-02-12 ENCOUNTER — Ambulatory Visit (INDEPENDENT_AMBULATORY_CARE_PROVIDER_SITE_OTHER): Payer: Medicare Other | Admitting: Internal Medicine

## 2015-02-12 ENCOUNTER — Encounter: Payer: Self-pay | Admitting: Internal Medicine

## 2015-02-12 VITALS — BP 146/70 | HR 89 | Ht 70.5 in | Wt 219.1 lb

## 2015-02-12 DIAGNOSIS — Z950 Presence of cardiac pacemaker: Secondary | ICD-10-CM | POA: Diagnosis not present

## 2015-02-12 DIAGNOSIS — I10 Essential (primary) hypertension: Secondary | ICD-10-CM | POA: Diagnosis not present

## 2015-02-12 DIAGNOSIS — I482 Chronic atrial fibrillation, unspecified: Secondary | ICD-10-CM

## 2015-02-12 DIAGNOSIS — E119 Type 2 diabetes mellitus without complications: Secondary | ICD-10-CM | POA: Diagnosis not present

## 2015-02-12 DIAGNOSIS — E785 Hyperlipidemia, unspecified: Secondary | ICD-10-CM | POA: Diagnosis not present

## 2015-02-12 DIAGNOSIS — I4891 Unspecified atrial fibrillation: Secondary | ICD-10-CM | POA: Diagnosis not present

## 2015-02-12 DIAGNOSIS — I4892 Unspecified atrial flutter: Secondary | ICD-10-CM | POA: Diagnosis not present

## 2015-02-12 DIAGNOSIS — R001 Bradycardia, unspecified: Secondary | ICD-10-CM | POA: Diagnosis not present

## 2015-02-12 DIAGNOSIS — Z125 Encounter for screening for malignant neoplasm of prostate: Secondary | ICD-10-CM | POA: Diagnosis not present

## 2015-02-12 LAB — CUP PACEART INCLINIC DEVICE CHECK
Date Time Interrogation Session: 20160823104110
Lead Channel Pacing Threshold Amplitude: 0.7 V
Lead Channel Pacing Threshold Pulse Width: 0.4 ms
Lead Channel Pacing Threshold Pulse Width: 0.4 ms
Lead Channel Setting Pacing Pulse Width: 0.4 ms
MDC IDC MSMT LEADCHNL RV IMPEDANCE VALUE: 526 Ohm
MDC IDC MSMT LEADCHNL RV PACING THRESHOLD AMPLITUDE: 0.7 V
MDC IDC MSMT LEADCHNL RV PACING THRESHOLD AMPLITUDE: 0.7 V
MDC IDC MSMT LEADCHNL RV PACING THRESHOLD PULSEWIDTH: 0.4 ms
MDC IDC MSMT LEADCHNL RV SENSING INTR AMPL: 8 mV
MDC IDC MSMT LEADCHNL RV SENSING INTR AMPL: 8.4 mV
MDC IDC PG MODEL: 394971
MDC IDC PG SERIAL: 68372291
MDC IDC SET LEADCHNL RV PACING AMPLITUDE: 2.4 V
MDC IDC STAT BRADY RV PERCENT PACED: 96 %

## 2015-02-12 LAB — POCT INR: INR: 2.1

## 2015-02-12 NOTE — Assessment & Plan Note (Signed)
His Biotronik PPM is working normally. Will recheck in several months.

## 2015-02-12 NOTE — Patient Instructions (Signed)
Medication Instructions:  Your physician recommends that you continue on your current medications as directed. Please refer to the Current Medication list given to you today.   Labwork: None ordered  Testing/Procedures: None ordered  Follow-Up: Your physician wants you to follow-up in: [place].   You will receive a reminder letter in the mail two months in advance. If you don't receive a letter, please call our office to schedule the follow-up appointment.   Remote monitoring is used to monitor your Pacemaker from home. This monitoring reduces the number of office visits required to check your device to one time per year. It allows [place] to keep an eye on the functioning of your device to ensure it is working properly. You are scheduled for a device check from home on 05-14-15. You may send your transmission at any time that day. If you have a wireless device, the transmission will be sent automatically. After your physician reviews your transmission, you will receive a postcard with your next transmission date.     Any Other Special Instructions Will Be Listed Below (If Applicable).

## 2015-02-12 NOTE — Progress Notes (Signed)
HPI Oscar Baker returns today for ongoing followup of bradycardia in the setting of atrial fibrillation, s/p PPM insertion. Since his implant he has been stable. He denies syncope. His cough has improved. He sees Dr. Melvyn Novas. He also has had nasal drainage which has improved.  Allergies  Allergen Reactions  . Altace [Ramipril] Other (See Comments)    "throat felt like had a knot in it"  . Codeine Nausea And Vomiting    Nausea and vomiting   . Simvastatin Other (See Comments)    Leg aches     Current Outpatient Prescriptions  Medication Sig Dispense Refill  . amLODipine (NORVASC) 10 MG tablet Take 10 mg by mouth at bedtime.   2  . aspirin 81 MG tablet Take 81 mg by mouth at bedtime.     [name] atorvastatin (LIPITOR) 20 MG tablet Take 20 mg by mouth at bedtime.  2  . dextromethorphan-guaiFENesin (MUCINEX DM) 30-600 MG per 12 hr tablet Take 1 tablet by mouth every 12 (twelve) hours as needed for cough.    Oscar Baker Calcium (STOOL SOFTENER PO) Per bottle as needed for constipation    . famotidine (PEPCID) 20 MG tablet Take 20 mg by mouth at bedtime.    . fenofibrate micronized (LOFIBRA) 200 MG capsule Take 1 capsule (200 mg total) by mouth daily before breakfast. 90 capsule 3  . glipiZIDE (GLUCOTROL XL) 10 MG 24 hr tablet Take 10 mg by mouth daily with breakfast.    . metFORMIN (GLUCOPHAGE) 1000 MG tablet Take 1,000 mg by mouth 2 (two) times daily with a meal.      . mometasone-formoterol (DULERA) 100-5 MCG/ACT AERO Inhale 2 puffs into the lungs 2 (two) times daily. 1 Inhaler 0  . multivitamin (THERAGRAN) per tablet Take 1 tablet by mouth daily.      . pantoprazole (PROTONIX) 40 MG tablet Take 1 tablet (40 mg total) by mouth daily before breakfast. 30 tablet 11  . sitaGLIPtan (JANUVIA) 100 MG tablet Take 100 mg by mouth at bedtime.     . traMADol (ULTRAM) 50 MG tablet Take 1-2 tablets by mouth every 4 hours as needed for cough or pain    . triamcinolone cream (KENALOG) 0.5 % Apply 1  application topically daily as needed (rash).     . vitamin B-12 (CYANOCOBALAMIN) 1000 MCG tablet Take 1,000 mcg by mouth daily.     [name] warfarin (COUMADIN) 5 MG tablet TAKE 1 TABLET BY MOUTH DAILY 90 tablet 1   No current facility-administered medications for this visit.     Past Medical History  Diagnosis Date  . Diabetes mellitus type 2, noninsulin dependent   . Hyperlipidemia   . Hypertension   . Sick sinus syndrome     a-Flutter with episodes of bradycardia; S/P Biotronik (serial number [id number])  . Pacemaker 02/08/2014    DR Lovena Le  . prostate ca dx'd 2006    xrt; prostatectomy    ROS:   All systems reviewed and negative except as noted in the HPI.   Past Surgical History  Procedure Laterality Date  . Back surgery      disk  . Appendectomy    . Tonsillectomy    . Total knee arthroplasty    . Pacemaker insertion  02/08/2014    Biotronik (serial number [id number])  . Insert / replace / remove pacemaker  02/08/2014     DR Lovena Le  . Permanent pacemaker insertion N/A 02/08/2014    Procedure: PERMANENT PACEMAKER INSERTION;  Surgeon: Evans Lance, MD;  Location: [facility] CATH LAB;  Service: Cardiovascular;  Laterality: N/A;     Family History  Problem Relation Age of Onset  . Emphysema Mother   . Heart attack Father      Social History   Social History  . Marital Status: Married    Spouse Name: N/A  . Number of Children: N/A  . Years of Education: N/A   Occupational History  . Not on file.   Social History Main Topics  . Smoking status: Former Smoker -- 25 years    Types: Pipe    Quit date: 11/25/1975  . Smokeless tobacco: Never Used  . Alcohol Use: No  . Drug Use: No  . Sexual Activity: Not on file   Other Topics Concern  . Not on file   Social History Narrative     BP 146/70 mmHg  Pulse 89  Ht 5' 10.5" (1.791 m)  Wt 219 lb 1.9 oz (99.392 kg)  BMI 30.99 kg/m2  Physical Exam:  Well appearing 81 yo man, NAD HEENT: Unremarkable Neck:  6 cm JVD,  no thyromegally Back:  No CVA tenderness Lungs:  Clear with no wheezes HEART:  IRegular brady rhythm, no murmurs, no rubs, no clicks Abd:  soft, positive bowel sounds, no organomegally, no rebound, no guarding Ext:  2 plus pulses, no edema, no cyanosis, no clubbing Skin:  No rashes no nodules Neuro:  CN II through XII intact, motor grossly intact  PPM - normal device function except for minimal noise on ventricular lead.   Assess/Plan:

## 2015-02-12 NOTE — Assessment & Plan Note (Signed)
His ventricular rate is well controlled. He will continue his current meds. 

## 2015-02-12 NOTE — Assessment & Plan Note (Signed)
He remains with HTN. His blood pressure meds are unchanged. He is encouraged to maintain a low sodium diet.

## 2015-02-13 DIAGNOSIS — N3942 Incontinence without sensory awareness: Secondary | ICD-10-CM | POA: Diagnosis not present

## 2015-02-13 DIAGNOSIS — N393 Stress incontinence (female) (male): Secondary | ICD-10-CM | POA: Diagnosis not present

## 2015-02-18 ENCOUNTER — Ambulatory Visit (INDEPENDENT_AMBULATORY_CARE_PROVIDER_SITE_OTHER): Payer: Medicare Other | Admitting: Adult Health

## 2015-02-18 ENCOUNTER — Encounter: Payer: Self-pay | Admitting: Adult Health

## 2015-02-18 ENCOUNTER — Telehealth: Payer: Self-pay | Admitting: Adult Health

## 2015-02-18 VITALS — BP 120/70 | HR 61 | Temp 98.0°F | Ht 70.0 in | Wt 217.0 lb

## 2015-02-18 DIAGNOSIS — R05 Cough: Secondary | ICD-10-CM | POA: Diagnosis not present

## 2015-02-18 DIAGNOSIS — J45991 Cough variant asthma: Secondary | ICD-10-CM

## 2015-02-18 DIAGNOSIS — R058 Other specified cough: Secondary | ICD-10-CM

## 2015-02-18 MED ORDER — AZELASTINE-FLUTICASONE 137-50 MCG/ACT NA SUSP: NASAL | Status: DC

## 2015-02-18 NOTE — Telephone Encounter (Signed)
Per OV today with TP: Patient Instructions     May stop [facility] and Protonix .  Follow med calendar closely and bring to each visit.  Use sips of water to soothe throat and to stop throat clearing.  Use Chlortrimeton 4mg  1 to 2 At bedtime .  May use extra Chlortrimeton 1 extra every 4hrs as needed.  Continue On Pepcid 20mg  At bedtime  Follow up Dr. Melvyn Novas In 3 months and As needed  --- Called spoke with pt. He reports he was told dymista was going to be called in. This is not on med list and no mention in note this would be called in. Please advise TP thanks

## 2015-02-18 NOTE — Telephone Encounter (Signed)
RX has been sent in. Pt aware. Nothing further needed 

## 2015-02-18 NOTE — Patient Instructions (Addendum)
May stop Dulera and Protonix .  Follow med calendar closely and bring to each visit.  Use sips of water to soothe throat and to stop throat clearing.  Use Chlortrimeton 4mg  1 to 2 At bedtime  .  May use extra Chlortrimeton 1 extra every 4hrs as needed.  Continue  On Pepcid 20mg  At bedtime   Follow up Dr. Melvyn Novas  In  3  months and As needed

## 2015-02-18 NOTE — Progress Notes (Signed)
Subjective:    Patient ID: Oscar Baker, male    DOB: June 13, 1933,   MRN: [id number]   Brief patient profile:  50 yowm with CLL / never regular cig smoker/ stopped pipe in 1980 assoc with  some cough which completely  resolved  p quit  then summer  of 2015 gradual onset sensation "like something stuck in throat" > stopped ACEi > referred to [facility] seen in Jan 2016 with neg w/u and  dx of bronchitis and referred to pulmonary by Dr Virgina Jock and first seen 07/27/2014    History of Present Illness  07/27/2014 1st [facility] office visit/ Wert   Chief Complaint  Patient presents with  . Pulmonary Consult    Referred by Dr. Virgina Jock. Pt c/o cough x 3-4 months- prod with moderate green sputum. He states cough is worse first thing in the am and then in the evening.   indolent onset persistent daily cough  X summer 2015 "when feet hit the floor each am"/ and worse at hs and after meals   / no better with hfa / d/c acei/ multile abx > no better and not impressed that saba helps either  rec Augmentin 875 mg take one pill twice daily  X 10 days - take at breakfast and supper with large glass of water.  It would help reduce the usual side effects (diarrhea and yeast infections) if you ate cultured yogurt at lunch.  - Note: your coumadin may need to be adjusted on this  medication Prednisone 10 mg take  4 each am x 2 days,   2 each am x 2 days,  1 each am x 2 days and stop  Pantoprazole (protonix) 40 mg   Take 30-60 min before first meal of the day and Pepcid 20 mg one bedtime until return to office - this is the best way to tell whether stomach acid is contributing to your problem.   for cough > mucinex dm 1200 mg every 12 hours and supplement with tramadol 50 mg 1 every 4 hours if needed  GERD  Diet  Late add consider trial of ICS next    08/10/2014 f/u ov/Wert re: prob cough variant asthma  Chief Complaint  Patient presents with  . Follow-up    Pt states that his cough is much improved. He states  he is still "spitting up" clear sputum.   did not maintain on rx for gerd Cough is worse p breakfast > white mucus but much better than it was Not limited by breathing from desired activities   >add singulair     10/30/2014 f/u ov/Wert re: ? Cough variant asthma  Chief Complaint  Patient presents with  . Follow-up    Pt c/o increased cough x 2 days- prod with light yellow sputum.  His throat feels sore.  He states his breathing is still doing well.    was doing great x months  but outside a lot over May 7 weekend then developed knot in throat and urge to clear it, did not use action plan at bottom of calendar and since last ov added back fish oil.  Cough urge worse at hs / took tramadol and improved and able to sleep rec Prednisone 10 mg take  4 each am x 2 days,   2 each am x 2 days,  1 each am x 2 days and stop  GERD  Diet    12/11/2014 f/u ov/Wert re: urge to clear throat esp p eat  never completely  resolved since late summer / fall 2015  Chief Complaint  Patient presents with  . Follow-up    Pt states his cough is slightly improved. Still coughing up large amounts of sputum- white. Cough is worse in the am and after he eats.    Not a total of more than one tbsp per day / doesn't wake him / no better with saba and admits in retrospect never lost urge to clear throat since summer of 2015 when acei stopped  >>gabapentin rx   01/08/2015 Follow up : Cough/AR/congestion , former smoker -Pipe , CLL  Returns for follow up and med review . Has been seen for cough /congestion for years,  Says congestion is unchanged from last ov  Gabapentin was added last ov for cough .  Spirometry today was normal -with FEV1 82% , ratio 76, FVC 81%.  Using mucinex dm and zyrtec Gabapentin did not help.  Does not cough that much , mainly thick mucus that sticks in throat.  Says he can live with it and does not want to keep taking meds.  No chest pain , hemoptysis , wt loss, fever, sinus pain , edema or  orthopnea.  Last CT chest 03/2014 , lung parenchyma w/ nad.  Allergy profile was high RAST and IgE. eos on cbc ok.  Reviewed meds and organized them into a med calendar with pt education  Appears to be taking correctly .  rec Chest xray today .  Follow med calendar closely and bring to each visit.     01/31/2015 f/u ov/Wert re: chronic cough x one year, best rx is prednisone /no better on zyrtec or gerd rx  Chief Complaint  Patient presents with  . Cough    States that he is having continued mucus build up in his throat. At times when he coughs he does produce beige mucus.  not using med calendar/ confused with maint vs prns/ already on singulair and zyrtec  >>pred taper and stop singulair . rx dymista    02/18/2015 Follow up Georgiann Hahn Review [place]ra NP : Chronic cough  Pt returns for follow up and med review  Reviewed his meds with pt education and med calendar updated.  He is feeling some better. Cough is decreased but not gone.  Has mostly dry cough but occasional white mucus.   Feels like he has something stuck in throat a lot and has to clear it.  Does not want to take all these meds. Does not want to take PPI .  Feels dymista nasal spray helped because not as much drainage.  No chest pain, orthopnea, edema or fever.  Not using Dulera , does not feel it is helping.       Current Medications, Allergies, Complete Past Medical History, Past Surgical History, Family History, and Social History were reviewed in [employer] record.  ROS  The following are not active complaints unless bolded sore throat, dysphagia, dental problems, itching, sneezing,  nasal congestion or excess/ purulent secretions, ear ache,   fever, chills, sweats, unintended wt loss, classically pleuritic or exertional cp, hemoptysis,  orthopnea pnd or leg swelling, presyncope, palpitations, abdominal pain, anorexia, nausea, vomiting, diarrhea  or change in bowel or bladder habits, change in  stools or urine, dysuria,hematuria,  rash, arthralgias, visual complaints, headache, numbness, weakness or ataxia or problems with walking or coordination,  change in mood/affect or memory.          Objective:   Physical Exam  amb pleasant wm nad   08/10/2014 227>222 09/11/2014 > 10/30/2014  228 > 12/11/2014  226 >225 01/08/2015 > 01/31/2015  221>217 02/18/15  Vital signs reviewed  HEENT: nl dentition, turbinates, and orophanx. Nl external ear canals without cough reflex   NECK :  without JVD/Nodes/TM/ nl carotid upstrokes bilaterally   LUNGS: no acc muscle use, CTA w/ no wheezing /cough or psuedowheeze    CV:  RRR  no s3 or murmur or increase in P2,  trace edema both lower ext  ABD:  soft and nontender with nl excursion in the supine position. No bruits or organomegaly, bowel sounds nl  MS:  warm without deformities, calf tenderness, cyanosis or clubbing  SKIN: warm and dry without lesions    NEURO:  alert, approp, no deficits     CXR:  01/08/15 1. Cardiac pacer noted in good anatomic position. Cardiomegaly with no pulmonary venous congestion. 2. No acute pulmonary infiltrate.                     Assessment & Plan:

## 2015-02-18 NOTE — Telephone Encounter (Signed)
Sorry , thought I sent the rx to pharmacy  Yes please send with 5 refills.  Thanks

## 2015-02-19 DIAGNOSIS — Z0000 Encounter for general adult medical examination without abnormal findings: Secondary | ICD-10-CM | POA: Diagnosis not present

## 2015-02-19 DIAGNOSIS — Z950 Presence of cardiac pacemaker: Secondary | ICD-10-CM | POA: Diagnosis not present

## 2015-02-19 DIAGNOSIS — Z23 Encounter for immunization: Secondary | ICD-10-CM | POA: Diagnosis not present

## 2015-02-19 DIAGNOSIS — E669 Obesity, unspecified: Secondary | ICD-10-CM | POA: Diagnosis not present

## 2015-02-19 DIAGNOSIS — R600 Localized edema: Secondary | ICD-10-CM | POA: Diagnosis not present

## 2015-02-19 DIAGNOSIS — Z6831 Body mass index (BMI) 31.0-31.9, adult: Secondary | ICD-10-CM | POA: Diagnosis not present

## 2015-02-19 DIAGNOSIS — I272 Other secondary pulmonary hypertension: Secondary | ICD-10-CM | POA: Diagnosis not present

## 2015-02-19 DIAGNOSIS — R05 Cough: Secondary | ICD-10-CM | POA: Diagnosis not present

## 2015-02-19 DIAGNOSIS — C9100 Acute lymphoblastic leukemia not having achieved remission: Secondary | ICD-10-CM | POA: Diagnosis not present

## 2015-02-19 DIAGNOSIS — Z7901 Long term (current) use of anticoagulants: Secondary | ICD-10-CM | POA: Diagnosis not present

## 2015-02-19 DIAGNOSIS — I495 Sick sinus syndrome: Secondary | ICD-10-CM | POA: Diagnosis not present

## 2015-02-19 DIAGNOSIS — I517 Cardiomegaly: Secondary | ICD-10-CM | POA: Diagnosis not present

## 2015-02-20 ENCOUNTER — Telehealth: Payer: Self-pay | Admitting: Adult Health

## 2015-02-20 MED ORDER — AZELASTINE HCL 0.1 % NA SOLN: 1.0000 | Freq: Two times a day (BID) | NASAL | Status: DC

## 2015-02-20 NOTE — Telephone Encounter (Signed)
Received denial letter for pt's dymista [id number], 1-2 sprays in each nostril daily that was prescribed on 02/18/15 Per TP's phone note   Melvenia Needles, NP at 02/18/2015 4:11 PM     Status: Signed       Expand All Collapse All   Sorry , thought I sent the rx to pharmacy  Yes please send with 5 refills.  Thanks        Informed TP and her rec is for pt to use Astelin 1-2 sprays in each nostril BID x 5 refills.   Called and spoke with pt. Reviewed TP's rec. Pt voiced understanding and stated he will be out of town until next week but will pick up rx when he comes back into town. He had no further questions. Rx was sent to pharmacy. Nothing further needed.

## 2015-02-21 NOTE — Assessment & Plan Note (Signed)
UAC ? Triggers of AR  Some improvement with tx aimed at [place] /GERD  He does not like taking several medications and would like to simplify his regimen.   Plan  May stop Dulera and Protonix .  Follow med calendar closely and bring to each visit.  Use sips of water to soothe throat and to stop throat clearing.  Use Chlortrimeton 4mg  1 to 2 At bedtime  .  May use extra Chlortrimeton 1 extra every 4hrs as needed.  Continue  On Pepcid 20mg  At bedtime   Follow up Dr. Melvyn Novas  In  3  months and As needed

## 2015-02-21 NOTE — Progress Notes (Signed)
Chart and office note reviewed in detail  > agree with a/p as outlined    

## 2015-02-21 NOTE — Assessment & Plan Note (Signed)
No perceived benefit with Dulera  Will try off [facility] for now  Restart if worse off.

## 2015-03-05 DIAGNOSIS — Z1212 Encounter for screening for malignant neoplasm of rectum: Secondary | ICD-10-CM | POA: Diagnosis not present

## 2015-03-20 ENCOUNTER — Other Ambulatory Visit: Payer: Self-pay | Admitting: Cardiovascular Disease

## 2015-03-20 NOTE — Telephone Encounter (Signed)
Refill on warfarin

## 2015-03-26 ENCOUNTER — Ambulatory Visit (INDEPENDENT_AMBULATORY_CARE_PROVIDER_SITE_OTHER): Payer: Medicare Other | Admitting: *Deleted

## 2015-03-26 DIAGNOSIS — I4891 Unspecified atrial fibrillation: Secondary | ICD-10-CM

## 2015-03-26 DIAGNOSIS — I4892 Unspecified atrial flutter: Secondary | ICD-10-CM

## 2015-03-26 LAB — POCT INR: INR: 2.2

## 2015-04-03 ENCOUNTER — Ambulatory Visit (INDEPENDENT_AMBULATORY_CARE_PROVIDER_SITE_OTHER): Payer: Medicare Other | Admitting: Internal Medicine

## 2015-04-03 ENCOUNTER — Encounter: Payer: Self-pay | Admitting: Internal Medicine

## 2015-04-03 VITALS — BP 132/76 | HR 67 | Ht 70.0 in | Wt 219.0 lb

## 2015-04-03 DIAGNOSIS — R05 Cough: Secondary | ICD-10-CM | POA: Diagnosis not present

## 2015-04-03 DIAGNOSIS — R058 Other specified cough: Secondary | ICD-10-CM

## 2015-04-03 DIAGNOSIS — E669 Obesity, unspecified: Secondary | ICD-10-CM | POA: Diagnosis not present

## 2015-04-03 DIAGNOSIS — J45991 Cough variant asthma: Secondary | ICD-10-CM

## 2015-04-03 MED ORDER — PREDNISONE 10 MG PO TABS: ORAL_TABLET | ORAL | Status: DC

## 2015-04-03 MED ORDER — TRAMADOL HCL 50 MG PO TABS: ORAL_TABLET | ORAL | Status: DC

## 2015-04-03 MED ORDER — AZELASTINE-FLUTICASONE 137-50 MCG/ACT NA SUSP: 1.0000 | Freq: Two times a day (BID) | NASAL | Status: DC

## 2015-04-03 NOTE — Patient Instructions (Addendum)
Please see patient coordinator before you leave today  to schedule sinus CT   Prednisone 10 mg take  4 each am x 2 days,   2 each am x 2 days,  1 each am x 2 days and stop   Dysmista one twice daily each nostril   For urge to cough can take tramadol 50 mg up to every 4 hours and even take 2 at night when can afford to be drowsy and it will stop you from coughing   GERD (REFLUX)  is an extremely common cause of respiratory symptoms just like yours , many times with no obvious heartburn at all.    It can be treated with medication, but also with lifestyle changes including elevation of the head of your bed (ideally with 6 inch  bed blocks),  Smoking cessation, avoidance of late meals, excessive alcohol, and avoid fatty foods, chocolate, peppermint, colas, red wine, and acidic juices such as orange juice.  NO MINT OR MENTHOL PRODUCTS SO NO COUGH DROPS  USE SUGARLESS CANDY INSTEAD (Jolley ranchers or Stover's or Life Savers) or even ice chips will also do - the key is to swallow to prevent all throat clearing. NO OIL BASED VITAMINS - use powdered substitutes.    Please schedule a follow up office visit in 2 weeks, sooner if needed with all meds and med calendar in hand

## 2015-04-03 NOTE — Progress Notes (Signed)
Subjective:    Patient ID: Oscar Baker, male    DOB: June 13, 1933,   MRN: [id number]   Brief patient profile:  50 yowm with CLL / never regular cig smoker/ stopped pipe in 1980 assoc with  some cough which completely  resolved  p quit  then summer  of 2015 gradual onset sensation "like something stuck in throat" > stopped ACEi > referred to [facility] seen in Jan 2016 with neg w/u and  dx of bronchitis and referred to pulmonary by Dr Virgina Jock and first seen 07/27/2014    History of Present Illness  07/27/2014 1st [facility] office visit/ Kendelle Schweers   Chief Complaint  Patient presents with  . Pulmonary Consult    Referred by Dr. Virgina Jock. Pt c/o cough x 3-4 months- prod with moderate green sputum. He states cough is worse first thing in the am and then in the evening.   indolent onset persistent daily cough  X summer 2015 "when feet hit the floor each am"/ and worse at hs and after meals   / no better with hfa / d/c acei/ multile abx > no better and not impressed that saba helps either  rec Augmentin 875 mg take one pill twice daily  X 10 days - take at breakfast and supper with large glass of water.  It would help reduce the usual side effects (diarrhea and yeast infections) if you ate cultured yogurt at lunch.  - Note: your coumadin may need to be adjusted on this  medication Prednisone 10 mg take  4 each am x 2 days,   2 each am x 2 days,  1 each am x 2 days and stop  Pantoprazole (protonix) 40 mg   Take 30-60 min before first meal of the day and Pepcid 20 mg one bedtime until return to office - this is the best way to tell whether stomach acid is contributing to your problem.   for cough > mucinex dm 1200 mg every 12 hours and supplement with tramadol 50 mg 1 every 4 hours if needed  GERD  Diet  Late add consider trial of ICS next    08/10/2014 f/u ov/Laira Penninger re: prob cough variant asthma  Chief Complaint  Patient presents with  . Follow-up    Pt states that his cough is much improved. He states  he is still "spitting up" clear sputum.   did not maintain on rx for gerd Cough is worse p breakfast > white mucus but much better than it was Not limited by breathing from desired activities   >add singulair     10/30/2014 f/u ov/Jaxxen Voong re: ? Cough variant asthma  Chief Complaint  Patient presents with  . Follow-up    Pt c/o increased cough x 2 days- prod with light yellow sputum.  His throat feels sore.  He states his breathing is still doing well.    was doing great x months  but outside a lot over May 7 weekend then developed knot in throat and urge to clear it, did not use action plan at bottom of calendar and since last ov added back fish oil.  Cough urge worse at hs / took tramadol and improved and able to sleep rec Prednisone 10 mg take  4 each am x 2 days,   2 each am x 2 days,  1 each am x 2 days and stop  GERD  Diet    12/11/2014 f/u ov/Mariko Nowakowski re: urge to clear throat esp p eat  never completely  resolved since late summer / fall 2015  Chief Complaint  Patient presents with  . Follow-up    Pt states his cough is slightly improved. Still coughing up large amounts of sputum- white. Cough is worse in the am and after he eats.   Not a total of more than one tbsp per day / doesn't wake him / no better with saba and admits in retrospect never lost urge to clear throat since summer of 2015 when acei stopped  >>gabapentin rx   01/08/2015 Follow up : Cough/AR/congestion , former smoker -Pipe , CLL  Returns for follow up and med review . Has been seen for cough /congestion for years,  Says congestion is unchanged from last ov  Gabapentin was added last ov for cough .  Spirometry today was normal -with FEV1 82% , ratio 76, FVC 81%.  Using mucinex dm and zyrtec Gabapentin did not help.  Does not cough that much , mainly thick mucus that sticks in throat.  Says he can live with it and does not want to keep taking meds.  No chest pain , hemoptysis , wt loss, fever, sinus pain , edema or  orthopnea.  Last CT chest 03/2014 , lung parenchyma w/ nad.  Allergy profile was high RAST and IgE. eos on cbc ok.  Reviewed meds and organized them into a med calendar with pt education  Appears to be taking correctly .  rec Chest xray today .  Follow med calendar closely and bring to each visit.     01/31/2015 f/u ov/Rubyann Lingle re: chronic cough x one year, best rx is prednisone /no better on zyrtec or gerd rx  Chief Complaint  Patient presents with  . Cough    States that he is having continued mucus build up in his throat. At times when he coughs he does produce beige mucus.  not using med calendar/ confused with maint vs prns/ already on singulair and zyrtec  >>pred taper and stop singulair   rx dymista    02/18/2015 Follow up Georgiann Hahn Review /[place] NP : Chronic cough  Pt returns for follow up and med review  Reviewed his meds with pt education and med calendar updated.  He is feeling some better. Cough is decreased but not gone.  Has mostly dry cough but occasional white mucus.   Feels like he has something stuck in throat a lot and has to clear it.  Does not want to take all these meds. Does not want to take PPI .  Feels dymista nasal spray helped because not as much drainage.  Not using Dulera , does not feel it is helping rec May stop [name] and Protonix .  Follow med calendar closely and bring to each visit.  Use sips of water to soothe throat and to stop throat clearing.  Use Chlortrimeton 4mg  1 to 2 At bedtime  .  May use extra Chlortrimeton 1 extra every 4hrs as needed.  Continue  On Pepcid 20mg  At bedtime    04/03/2015  Acute extended  ov/Trevar Boehringer re: cough worse esp at hs  / never really better since summer of 2015  Chief Complaint  Patient presents with  . Acute Visit    C/o prod cough (yellow phlem). Does not feel it is related to GERD.     Constant sensation of pnds but really very little mucus production since last ov/ not clear he actually followed any of the  instructions consistently stating repeatedly "nothing you give  me works"  Even though last ov said he was quite a bit better and wanted off meds and clearly worse since stopped them.  Not limited by breathing from desired activities    No obvious day to day or daytime variability or assoc cp or chest tightness, subjective wheeze or overt   hb symptoms. No unusual exp hx or h/o childhood pna/ asthma or knowledge of premature birth.  Sleeping ok without nocturnal  or early am exacerbation  of respiratory  c/o's or need for noct saba. Also denies any obvious fluctuation of symptoms with weather or environmental changes or other aggravating or alleviating factors except as outlined above   Current Medications, Allergies, Complete Past Medical History, Past Surgical History, Family History, and Social History were reviewed in [employer] record.  ROS  The following are not active complaints unless bolded sore throat, dysphagia, dental problems, itching, sneezing,  nasal congestion or excess/ purulent secretions, ear ache,   fever, chills, sweats, unintended wt loss, classically pleuritic or exertional cp, hemoptysis,  orthopnea pnd or leg swelling, presyncope, palpitations, abdominal pain, anorexia, nausea, vomiting, diarrhea  or change in bowel or bladder habits, change in stools or urine, dysuria,hematuria,  rash, arthralgias, visual complaints, headache, numbness, weakness or ataxia or problems with walking or coordination,  change in mood/affect or memory.               Objective:   Physical Exam  amb pleasant wm nad with forced cough / hopeless affect and attitude noted  08/10/2014 227>222 09/11/2014 > 10/30/2014  228 > 12/11/2014  226 >225 01/08/2015 > 01/31/2015  221>217 02/18/15> 04/03/2015  219   Vital signs reviewed  HEENT: nl dentition, turbinates, and orophanx. Nl external ear canals without cough reflex   NECK :  without JVD/Nodes/TM/ nl carotid upstrokes  bilaterally   LUNGS: no acc muscle use, CTA w/ classic psuedowheeze resolves with plm   CV:  RRR  no s3 or murmur or increase in P2,  No sign edema both lower ext  ABD:  soft and nontender with nl excursion in the supine position. No bruits or organomegaly, bowel sounds nl  MS:  warm without deformities, calf tenderness, cyanosis or clubbing  SKIN: warm and dry without lesions    NEURO:  alert, approp, no deficits     CXR:  01/08/15 1. Cardiac pacer noted in good anatomic position. Cardiomegaly with no pulmonary venous congestion. 2. No acute pulmonary infiltrate.       Assessment & Plan:

## 2015-04-04 DIAGNOSIS — N3942 Incontinence without sensory awareness: Secondary | ICD-10-CM | POA: Diagnosis not present

## 2015-04-04 NOTE — Assessment & Plan Note (Signed)
Still have not completely excluded a component of asthma here and he appears to be at risk on basis of what appears to be sign atopy  rec  Prednisone 10 mg take  4 each am x 2 days,   2 each am x 2 days,  1 each am x 2 days and stop but no further inhalers as he was not convinced dulera helped at all  Consider MCT next if eval and rx of uacs as outlined is not fruitful

## 2015-04-04 NOTE — Assessment & Plan Note (Addendum)
-   Allergy profile  07/27/14 Eos 0.3  IgE  391 > Pos RAST dust/ mold/treees/ grasses - trial of singulair 08/10/14 > d/c 01/31/15 as not effective  -med calendar 09/11/2014 , 01/08/2015  01/08/2015 Spirometry  normal -  FEV1 82% , ratio 76, FVC 81% - 01/31/2015 p extensive coaching HFA effectiveness =    75% so try dulera 100 2bid > denied improvement so stoppe.  - trial of neurontin 100 qid 12/11/2014 >>> no benefit ., d/c neurontin 01/08/2015  - dymista added back 04/03/2015  - Sinus CT ordered  I had an extended discussion with the patient reviewing all relevant studies completed to date and  lasting 25 minutes of a 40 minute visit    The standardized cough guidelines published in Chest by Lissa Morales in 2006 are still the best available and consist of a multiple step process (up to 12!) , not a single office visit,  and are intended  to address this problem logically,  with an alogrithm dependent on response to empiric treatment at  each progressive step  to determine a specific diagnosis with  minimal addtional testing needed. Therefore if adherence is an issue or can't be accurately verified,  it's very unlikely the standard evaluation and treatment will be successful here.   Furthermore, response to therapy (other than acute cough suppression, which should only be used short term with avoidance of narcotic containing cough syrups if possible), can be a gradual process for which the patient may perceive immediate benefit.  Unlike going to an eye doctor where the best perscription is almost always the first one and is immediately effective, this is almost never the case in the management of chronic cough syndromes. Therefore the patient needs to commit up front to consistently adhere to recommendations  for up to 6 weeks of therapy directed at the likely underlying problem(s) before the response can be reasonably evaluated.  Each maintenance medication was reviewed in detail including most importantly  the difference between maintenance and prns and under what circumstances the prns are to be triggered using an action plan format that is not reflected in the computer generated alphabetically organized AVS.    Please see instructions for details which were reviewed in writing and the patient given a copy highlighting the part that I personally wrote and discussed at today's ov.

## 2015-04-04 NOTE — Assessment & Plan Note (Signed)
Body mass index is 31.42    No results found for: TSH   Contributing to gerd tendency/ doe/reviewed the need and the process to achieve and maintain neg calorie balance > defer f/u primary care including intermittently monitoring thyroid status

## 2015-04-08 ENCOUNTER — Other Ambulatory Visit: Payer: Self-pay | Admitting: Internal Medicine

## 2015-04-08 ENCOUNTER — Ambulatory Visit (INDEPENDENT_AMBULATORY_CARE_PROVIDER_SITE_OTHER)
Admission: RE | Admit: 2015-04-08 | Discharge: 2015-04-08 | Disposition: A | Discharge status: Home / Self Care | Payer: Medicare Other | Source: Ambulatory Visit | Attending: Internal Medicine | Admitting: Internal Medicine

## 2015-04-08 DIAGNOSIS — R05 Cough: Secondary | ICD-10-CM

## 2015-04-08 DIAGNOSIS — J329 Chronic sinusitis, unspecified: Secondary | ICD-10-CM | POA: Diagnosis not present

## 2015-04-08 DIAGNOSIS — R058 Other specified cough: Secondary | ICD-10-CM

## 2015-04-08 MED ORDER — AMOXICILLIN-POT CLAVULANATE 875-125 MG PO TABS: 1.0000 | ORAL_TABLET | Freq: Two times a day (BID) | ORAL | Status: DC

## 2015-04-08 NOTE — Progress Notes (Signed)
Quick Note:  LMTCB ______ 

## 2015-04-08 NOTE — Progress Notes (Signed)
Quick Note:  Spoke with pt and notified of results per Dr. Wert. Pt verbalized understanding and denied any questions.  ______ 

## 2015-04-17 ENCOUNTER — Encounter: Payer: Self-pay | Admitting: Adult Health

## 2015-04-17 ENCOUNTER — Ambulatory Visit (INDEPENDENT_AMBULATORY_CARE_PROVIDER_SITE_OTHER): Payer: Medicare Other | Admitting: Adult Health

## 2015-04-17 ENCOUNTER — Telehealth: Payer: Self-pay | Admitting: Adult Health

## 2015-04-17 VITALS — BP 128/62 | HR 64 | Temp 97.5°F | Ht 70.0 in | Wt 215.0 lb

## 2015-04-17 DIAGNOSIS — R058 Other specified cough: Secondary | ICD-10-CM

## 2015-04-17 DIAGNOSIS — R05 Cough: Secondary | ICD-10-CM | POA: Diagnosis not present

## 2015-04-17 NOTE — Progress Notes (Signed)
Subjective:    Patient ID: Oscar Baker, male    DOB: June 13, 1933,   MRN: [id number]   Brief patient profile:  50 yowm with CLL / never regular cig smoker/ stopped pipe in 1980 assoc with  some cough which completely  resolved  p quit  then summer  of 2015 gradual onset sensation "like something stuck in throat" > stopped ACEi > referred to [facility] seen in Jan 2016 with neg w/u and  dx of bronchitis and referred to pulmonary by Dr Virgina Jock and first seen 07/27/2014    History of Present Illness  07/27/2014 1st [facility] office visit/ Wert   Chief Complaint  Patient presents with  . Pulmonary Consult    Referred by Dr. Virgina Jock. Pt c/o cough x 3-4 months- prod with moderate green sputum. He states cough is worse first thing in the am and then in the evening.   indolent onset persistent daily cough  X summer 2015 "when feet hit the floor each am"/ and worse at hs and after meals   / no better with hfa / d/c acei/ multile abx > no better and not impressed that saba helps either  rec Augmentin 875 mg take one pill twice daily  X 10 days - take at breakfast and supper with large glass of water.  It would help reduce the usual side effects (diarrhea and yeast infections) if you ate cultured yogurt at lunch.  - Note: your coumadin may need to be adjusted on this  medication Prednisone 10 mg take  4 each am x 2 days,   2 each am x 2 days,  1 each am x 2 days and stop  Pantoprazole (protonix) 40 mg   Take 30-60 min before first meal of the day and Pepcid 20 mg one bedtime until return to office - this is the best way to tell whether stomach acid is contributing to your problem.   for cough > mucinex dm 1200 mg every 12 hours and supplement with tramadol 50 mg 1 every 4 hours if needed  GERD  Diet  Late add consider trial of ICS next    08/10/2014 f/u ov/Wert re: prob cough variant asthma  Chief Complaint  Patient presents with  . Follow-up    Pt states that his cough is much improved. He states  he is still "spitting up" clear sputum.   did not maintain on rx for gerd Cough is worse p breakfast > white mucus but much better than it was Not limited by breathing from desired activities   >add singulair     10/30/2014 f/u ov/Wert re: ? Cough variant asthma  Chief Complaint  Patient presents with  . Follow-up    Pt c/o increased cough x 2 days- prod with light yellow sputum.  His throat feels sore.  He states his breathing is still doing well.    was doing great x months  but outside a lot over May 7 weekend then developed knot in throat and urge to clear it, did not use action plan at bottom of calendar and since last ov added back fish oil.  Cough urge worse at hs / took tramadol and improved and able to sleep rec Prednisone 10 mg take  4 each am x 2 days,   2 each am x 2 days,  1 each am x 2 days and stop  GERD  Diet    12/11/2014 f/u ov/Wert re: urge to clear throat esp p eat  never completely  resolved since late summer / fall 2015  Chief Complaint  Patient presents with  . Follow-up    Pt states his cough is slightly improved. Still coughing up large amounts of sputum- white. Cough is worse in the am and after he eats.   Not a total of more than one tbsp per day / doesn't wake him / no better with saba and admits in retrospect never lost urge to clear throat since summer of 2015 when acei stopped  >>gabapentin rx   01/08/2015 Follow up : Cough/AR/congestion , former smoker -Pipe , CLL  Returns for follow up and med review . Has been seen for cough /congestion for years,  Says congestion is unchanged from last ov  Gabapentin was added last ov for cough .  Spirometry today was normal -with FEV1 82% , ratio 76, FVC 81%.  Using mucinex dm and zyrtec Gabapentin did not help.  Does not cough that much , mainly thick mucus that sticks in throat.  Says he can live with it and does not want to keep taking meds.  No chest pain , hemoptysis , wt loss, fever, sinus pain , edema or  orthopnea.  Last CT chest 03/2014 , lung parenchyma w/ nad.  Allergy profile was high RAST and IgE. eos on cbc ok.  Reviewed meds and organized them into a med calendar with pt education  Appears to be taking correctly .  rec Chest xray today .  Follow med calendar closely and bring to each visit.     01/31/2015 f/u ov/Wert re: chronic cough x one year, best rx is prednisone /no better on zyrtec or gerd rx  Chief Complaint  Patient presents with  . Cough    States that he is having continued mucus build up in his throat. At times when he coughs he does produce beige mucus.  not using med calendar/ confused with maint vs prns/ already on singulair and zyrtec  >>pred taper and stop singulair   rx dymista    02/18/2015 Follow up Georgiann Hahn Review /Brevin Mcfadden NP : Chronic cough  Pt returns for follow up and med review  Reviewed his meds with pt education and med calendar updated.  He is feeling some better. Cough is decreased but not gone.  Has mostly dry cough but occasional white mucus.   Feels like he has something stuck in throat a lot and has to clear it.  Does not want to take all these meds. Does not want to take PPI .  Feels dymista nasal spray helped because not as much drainage.  Not using Dulera , does not feel it is helping rec May stop [facility] and Protonix .  Follow med calendar closely and bring to each visit.  Use sips of water to soothe throat and to stop throat clearing.  Use Chlortrimeton 4mg  1 to 2 At bedtime  .  May use extra Chlortrimeton 1 extra every 4hrs as needed.  Continue  On Pepcid 20mg  At bedtime    04/03/2015  Acute extended  ov/Wert re: cough worse esp at hs  / never really better since summer of 2015  Chief Complaint  Patient presents with  . Acute Visit    C/o prod cough (yellow phlem). Does not feel it is related to GERD.     Constant sensation of pnds but really very little mucus production since last ov/ not clear he actually followed any of the  instructions consistently stating repeatedly "nothing you give  me works"  Even though last ov said he was quite a bit better and wanted off meds and clearly worse since stopped them. >> CT sinus, positive for maxillary sinusitis treated with Augmentin 10 days  04/17/2015 Follow up : Cough  Patient returns for a two-week follow-up. He was seen last visit with a worsening of his chronic cough. A CT sinus was done that showed a air-fluid level in the maxillary sinuses. He was started on Augmentin for 10 days. Patient says he's had minimal improvement in symptoms. Continues to have a cough throughout the day that is typically worse in the afternoon hours. Food makes his cough worse. Patient has a sensation that something is stuck in the back of his throat.  he is very frustrated with his cough and is requesting to switch doctors He denies any hemoptysis, chest pain, orthopnea, PND or leg swelling. We reviewed all his medications organize them into a medication calendar with patient education He is taking his medications correctly.     Current Medications, Allergies, Complete Past Medical History, Past Surgical History, Family History, and Social History were reviewed in [employer] record.  ROS  The following are not active complaints unless bolded sore throat, dysphagia, dental problems, itching, sneezing,  nasal congestion or excess/ purulent secretions, ear ache,   fever, chills, sweats, unintended wt loss, classically pleuritic or exertional cp, hemoptysis,  orthopnea pnd or leg swelling, presyncope, palpitations, abdominal pain, anorexia, nausea, vomiting, diarrhea  or change in bowel or bladder habits, change in stools or urine, dysuria,hematuria,  rash, arthralgias, visual complaints, headache, numbness, weakness or ataxia or problems with walking or coordination,  change in mood/affect or memory.               Objective:   Physical Exam  amb pleasant wm nad     08/10/2014 227>222 09/11/2014 > 10/30/2014  228 > 12/11/2014  226 >225 01/08/2015 > 01/31/2015  221>217 02/18/15> 04/03/2015  219 >215 04/17/2015   Vital signs reviewed  HEENT: nl dentition, turbinates, and orophanx. Nl external ear canals without cough reflex   NECK :  without JVD/Nodes/TM/ nl carotid upstrokes bilaterally   LUNGS: no acc muscle use, CTA w/  psuedowheeze on forced exp    CV:  RRR  no s3 or murmur or increase in P2,  No sign edema both lower ext  ABD:  soft and nontender with nl excursion in the supine position. No bruits or organomegaly, bowel sounds nl  MS:  warm without deformities, calf tenderness, cyanosis or clubbing  SKIN: warm and dry without lesions    NEURO:  alert, approp, no deficits     CXR:  01/08/15 1. Cardiac pacer noted in good anatomic position. Cardiomegaly with no pulmonary venous congestion. 2. No acute pulmonary infiltrate.       Assessment & Plan:

## 2015-04-17 NOTE — Patient Instructions (Addendum)
Follow med calendar closely and bring to each visit.  Use sips of water to soothe throat and to stop throat clearing.  Restart Chlortrimeton 4mg   2 At bedtime  .  May use extra Chlortrimeton 1 extra every 4hrs as needed.  Increase Pepcid 20mg   Twice daily   Make an appointment with Dr. Ernesto Rutherford to evaluate your throat and sinus issues.  Finish Augmentin .  follow up in 2 months and As needed  -we will call you with this appointment

## 2015-04-17 NOTE — Telephone Encounter (Signed)
Fine with me but doesn't necessarily need a pulmonary f/u > would suggest he see Kozlow or allergist of choice  if ent eval not fruitful

## 2015-04-17 NOTE — Telephone Encounter (Signed)
Pt had ov on 04/17/15 with TP, during this visit pt requested to switch from MW to another provider within the office.    MW please advise if your okay with pt switching and do you have a preference

## 2015-04-17 NOTE — Assessment & Plan Note (Addendum)
Patient has had an extensive workup for his chronic cough Patient has had an elevated IgE and positive for grass test and was tried on Singulair without any significant improvement. Spirometry showed no airflow obstruction. He has been tried on multiple regimens to prevent reflux, postnasal drip and cough suppression. Was given Neurontin without any perceived benefit. Recent CT sinus did show a mild sinus infection of the maxillary sinus was started on a ten-day course of Augmentin . Patient does request to change doctors. Patient's medications were reviewed today and patient education was given. Computerized medication calendar was adjusted/completed He is followed by ENT and requests to be seen by them for persistent sensation of something stuck in his throat.  Plan  Follow med calendar closely and bring to each visit.  Use sips of water to soothe throat and to stop throat clearing.  Restart Chlortrimeton 4mg   2 At bedtime  .  May use extra Chlortrimeton 1 extra every 4hrs as needed.  Increase Pepcid 20mg   Twice daily   Make an appointment with Dr. Ernesto Rutherford to evaluate your throat and sinus issues.  Finish Augmentin .  follow up in 2 months and As needed  -we will call you with this appointment

## 2015-04-17 NOTE — Progress Notes (Signed)
Chart and office note reviewed in detail along with available xrays/ labs > he has a classic upper airway cough syndrome and once he is done with Dr. Ernesto Rutherford I would consider referring him to an allergist if he is not better rather than returning to another pulmonary doctor.

## 2015-04-17 NOTE — Telephone Encounter (Signed)
Called and spoke with pt. Informed him of MW's recs. Pt stated that after thinking about it, he does not want to switch pulmonologist and would like to keep his ov with MW on 05/21/15 to discuss seeing an allergist. He stated that he sees the ENT within the next week and if it seems to solve the issue will cancel ov MW. Pt voiced understanding and had no further questions. Nothing further needed. Will sign off on message.

## 2015-04-18 ENCOUNTER — Telehealth: Payer: Self-pay | Admitting: Internal Medicine

## 2015-04-18 DIAGNOSIS — J342 Deviated nasal septum: Secondary | ICD-10-CM | POA: Diagnosis not present

## 2015-04-18 DIAGNOSIS — R05 Cough: Secondary | ICD-10-CM

## 2015-04-18 DIAGNOSIS — J322 Chronic ethmoidal sinusitis: Secondary | ICD-10-CM | POA: Diagnosis not present

## 2015-04-18 DIAGNOSIS — J301 Allergic rhinitis due to pollen: Secondary | ICD-10-CM | POA: Diagnosis not present

## 2015-04-18 DIAGNOSIS — J320 Chronic maxillary sinusitis: Secondary | ICD-10-CM | POA: Diagnosis not present

## 2015-04-18 DIAGNOSIS — J040 Acute laryngitis: Secondary | ICD-10-CM | POA: Diagnosis not present

## 2015-04-18 DIAGNOSIS — J410 Simple chronic bronchitis: Secondary | ICD-10-CM | POA: Diagnosis not present

## 2015-04-18 DIAGNOSIS — R059 Cough, unspecified: Secondary | ICD-10-CM

## 2015-04-18 NOTE — Telephone Encounter (Signed)
Katie, Please see message below and advise where we can place pt on CY schedule.

## 2015-04-18 NOTE — Telephone Encounter (Signed)
Called and spoke with pt Pt stated that he had an appt with Dr Ernesto Rutherford this morning and was told that cough was due to allergies and Dr Ernesto Rutherford wanted pt to f/u with Dr Annamaria Boots about this instead of Dr Melvyn Novas  Pt also stated that he needed a refill on his tramadol that rx'd on 04/03/15.  Pt states that he has been taking medication as rx'd and has helped with cough  Dr Melvyn Novas, are you ok with pt seeing Dr Annamaria Boots for an allergy consult? Are you ok with refilling tramadol? Please advise. Thanks

## 2015-04-18 NOTE — Telephone Encounter (Signed)
Spoke with the pt  He states he has decided he wants allergy consult with [facility]  I was looking for 1st available (not until 07/31/14) when phone stared going in and out and I could no longer hear the pt  I tried calling back x 3 and line busy  [facility]

## 2015-04-18 NOTE — Telephone Encounter (Signed)
Absolutely but it will likely be months before Dr Annamaria Boots can see a new allergy consult > if he wants to wait until first available, fine, otherwise I would send to Dr Neldon Mc but be sure he takes his med calendar tammy made with him as Kozlow may not be fully in epic yet

## 2015-04-18 NOTE — Telephone Encounter (Signed)
[phone] calling bqack

## 2015-04-19 NOTE — Telephone Encounter (Signed)
Next opening for CY is 07-2015; pt can call to see if sooner appt becomes available.

## 2015-04-19 NOTE — Telephone Encounter (Signed)
lmtcb for pt.  

## 2015-04-22 ENCOUNTER — Other Ambulatory Visit: Payer: Self-pay | Admitting: Internal Medicine

## 2015-04-22 NOTE — Addendum Note (Signed)
Addended by: Osa Craver on: 04/22/2015 11:32 AM   Modules accepted: Orders, Medications

## 2015-04-22 NOTE — Telephone Encounter (Signed)
Spoke with pt. He would like to see if Kozlow can get him in sooner than CY. Order will be placed. Nothing further was needed.

## 2015-04-29 ENCOUNTER — Telehealth: Payer: Self-pay | Admitting: Internal Medicine

## 2015-04-29 MED ORDER — TRAMADOL HCL 50 MG PO TABS: ORAL_TABLET | ORAL | Status: DC

## 2015-04-29 NOTE — Telephone Encounter (Signed)
Spoke with pt. Rx has been called in. Nothing further was needed.

## 2015-04-29 NOTE — Telephone Encounter (Signed)
Plan was to see Allergy = Kozlow asap and let him take over on all meds so rec 1) find out when [facility] can see him and if over 2 weeks needs to return here with med calendar before tramadol runs out 2) find out how many tramadol using per day and give him enough to last x 2 weeks as by the end of 2 weeks he needs to be seeing allergist or returning here.

## 2015-04-29 NOTE — Telephone Encounter (Signed)
Patient has appointment with Dr. Neldon Mc on 05/21/15.   Patient wants to know if we can send in 30 tablets because he says that no matter what he has to pay for 30 tablets.   Dr. Melvyn Novas, please advise if we can send in 30 tablets.

## 2015-04-29 NOTE — Telephone Encounter (Signed)
Pt is requesting refill on tramadol. Last refilled 10/12 #40 x 0 refills Take 1 tablet by mouth every 4 hours as needed for cough or pain Pt saw TP on 10/26 and told to f/u in 2 months Please advise MW thanks

## 2015-04-29 NOTE — Telephone Encounter (Signed)
That's fine

## 2015-04-30 ENCOUNTER — Telehealth: Payer: Self-pay | Admitting: Oncology

## 2015-04-30 ENCOUNTER — Other Ambulatory Visit (HOSPITAL_BASED_OUTPATIENT_CLINIC_OR_DEPARTMENT_OTHER): Payer: Medicare Other

## 2015-04-30 ENCOUNTER — Ambulatory Visit (HOSPITAL_BASED_OUTPATIENT_CLINIC_OR_DEPARTMENT_OTHER): Payer: Medicare Other | Admitting: Oncology

## 2015-04-30 VITALS — BP 145/70 | HR 98 | Temp 97.7°F | Resp 18 | Ht 70.0 in | Wt 224.8 lb

## 2015-04-30 DIAGNOSIS — C9110 Chronic lymphocytic leukemia of B-cell type not having achieved remission: Secondary | ICD-10-CM | POA: Diagnosis not present

## 2015-04-30 LAB — CBC WITH DIFFERENTIAL/PLATELET
BASO%: 0.4 % (ref 0.0–2.0)
Basophils Absolute: 0.1 10*3/uL (ref 0.0–0.1)
EOS ABS: 0.5 10*3/uL (ref 0.0–0.5)
EOS%: 1.4 % (ref 0.0–7.0)
HEMATOCRIT: 41.4 % (ref 38.4–49.9)
HEMOGLOBIN: 13.3 g/dL (ref 13.0–17.1)
LYMPH#: 30.5 10*3/uL — AB (ref 0.9–3.3)
LYMPH%: 84.9 % — ABNORMAL HIGH (ref 14.0–49.0)
MCH: 33.3 pg (ref 27.2–33.4)
MCHC: 32.2 g/dL (ref 32.0–36.0)
MCV: 103.4 fL — AB (ref 79.3–98.0)
MONO#: 0.1 10*3/uL (ref 0.1–0.9)
MONO%: 0.3 % (ref 0.0–14.0)
NEUT%: 13 % — ABNORMAL LOW (ref 39.0–75.0)
NEUTROS ABS: 4.7 10*3/uL (ref 1.5–6.5)
PLATELETS: 295 10*3/uL (ref 140–400)
RBC: 4 10*6/uL — ABNORMAL LOW (ref 4.20–5.82)
RDW: 21 % — AB (ref 11.0–14.6)
WBC: 35.9 10*3/uL — AB (ref 4.0–10.3)

## 2015-04-30 LAB — COMPREHENSIVE METABOLIC PANEL (CC13)
ALBUMIN: 3.8 g/dL (ref 3.5–5.0)
ALT: 22 U/L (ref 0–55)
ANION GAP: 6 meq/L (ref 3–11)
AST: 28 U/L (ref 5–34)
Alkaline Phosphatase: 60 U/L (ref 40–150)
BILIRUBIN TOTAL: 0.63 mg/dL (ref 0.20–1.20)
BUN: 19.6 mg/dL (ref 7.0–26.0)
CO2: 24 mEq/L (ref 22–29)
Calcium: 9.2 mg/dL (ref 8.4–10.4)
Chloride: 110 mEq/L — ABNORMAL HIGH (ref 98–109)
Creatinine: 1 mg/dL (ref 0.7–1.3)
EGFR: 67 mL/min/{1.73_m2} — AB (ref >90)
GLUCOSE: 140 mg/dL (ref 70–140)
Potassium: 4.8 mEq/L (ref 3.5–5.1)
SODIUM: 141 meq/L (ref 136–145)
TOTAL PROTEIN: 6.6 g/dL (ref 6.4–8.3)

## 2015-04-30 LAB — TECHNOLOGIST REVIEW

## 2015-04-30 NOTE — Telephone Encounter (Signed)
GAVE AND PRITNED APPT SCHED AND AVS FO RPT FOR mAY 2017

## 2015-04-30 NOTE — Progress Notes (Signed)
Hematology and Oncology Follow Up Visit  Oscar Baker [id number] [birth year] 81 y.o. 04/30/2015 9:31 AM [name], MDRusso, [name], MD   Principle Diagnosis: 81 year old gentleman with CLL diagnosed in August 2015. He presented with leukocytosis and mild lymphadenopathy.  Current therapy: Observation and surveillance:  Interim History:  [name] presents today for a follow-up visit. Since the last visit, he continues to have issues with sinus congestion and cough. He had been seen by pulmonary medicine, otolaryngology and most recently referred to allergy specialist. He does not report any constitutional symptoms of fevers or chills or sweats. He does not report any painful adenopathy or change in his performance status. He continues to perform activities of daily living without any major decline. He did lose weight in the last 6 months but mostly was intentional.  He does not report any headaches or blurry vision or syncope. He does not report any chest pain, palpitation or shortness of breath. He does not report any nausea, vomiting, abdominal pain. He does not report any frequency urgency or hesitancy. He does not report any skeletal complaints. Rest of his review of systems unremarkable.  Medications: I have reviewed the patient's current medications.  Current Outpatient Prescriptions  Medication Sig Dispense Refill  . albuterol (PROVENTIL HFA;VENTOLIN HFA) 108 (90 BASE) MCG/ACT inhaler Inhale 2 puffs into the lungs every 4 (four) hours as needed for wheezing or shortness of breath.    [name] amLODipine (NORVASC) 10 MG tablet Take 10 mg by mouth at bedtime.   2  . aspirin 81 MG tablet Take 81 mg by mouth at bedtime.     [name] atorvastatin (LIPITOR) 20 MG tablet Take 20 mg by mouth at bedtime.  2  . chlorpheniramine (CHLOR-TRIMETON) 4 MG tablet Take 1-2 tablets by mouth daily at bedtime    . chlorpheniramine (CHLOR-TRIMETON) 4 MG tablet Take extra tablet by mouth every 4 hours as needed for  drainage, throat tickle, and throat clearing    . dextromethorphan-guaiFENesin (MUCINEX DM) 30-600 MG per 12 hr tablet Take 1 tablet by mouth every 12 (twelve) hours as needed for cough (and congestion).     [name] Calcium (STOOL SOFTENER PO) Use as needed for constipation    . famotidine (PEPCID) 20 MG tablet Take 20 mg by mouth 2 (two) times daily.     . fenofibrate micronized (LOFIBRA) 200 MG capsule Take 1 capsule (200 mg total) by mouth daily before breakfast. 90 capsule 3  . glipiZIDE (GLUCOTROL XL) 10 MG 24 hr tablet Take 2.5 mg by mouth daily with breakfast.     . metFORMIN (GLUCOPHAGE) 1000 MG tablet Take 1,000 mg by mouth 2 (two) times daily with a meal.      . multivitamin (THERAGRAN) per tablet Take 1 tablet by mouth every morning.     . sitaGLIPtan (JANUVIA) 100 MG tablet Take 100 mg by mouth at bedtime.     . sodium chloride (OCEAN) 0.65 % SOLN nasal spray Place 1 spray into both nostrils as needed (nasal congestion).    . traMADol (ULTRAM) 50 MG tablet Take 1 tablet by mouth every 4 hours as needed for cough or pain 30 tablet 0  . triamcinolone cream (KENALOG) 0.5 % Apply 1 application topically daily as needed (rash).     . vitamin B-12 (CYANOCOBALAMIN) 1000 MCG tablet Take 1,000 mcg by mouth every morning.     . warfarin (COUMADIN) 5 MG tablet TAKE 1 TABLET BY MOUTH DAILY (Patient taking differently: TAKE 1 TABLET BY  MOUTH DAILY PER CLINIC) 90 tablet 1   No current facility-administered medications for this visit.     Allergies:  Allergies  Allergen Reactions  . Altace [Ramipril] Other (See Comments)    "throat felt like had a knot in it"  . Codeine Nausea And Vomiting    Nausea and vomiting   . Simvastatin Other (See Comments)    Leg aches    Past Medical History, Surgical history, Social history, and Family History were reviewed and updated.   Physical Exam: Blood pressure 145/70, pulse 98, temperature 97.7 F (36.5 C), temperature source Oral, resp. rate  18, height 5\' 10"  (1.778 m), weight 224 lb 12.8 oz (101.969 kg), SpO2 98 %. ECOG: 0 General appearance: alert and cooperative did not appear in any distress. Head: Normocephalic, without obvious abnormality Neck: no adenopathy no thyroid masses. Lymph nodes: Cervical, supraclavicular, and axillary nodes normal. Heart:regular rate and rhythm, S1, S2 normal, no murmur, click, rub or gallop Lung:chest clear, no wheezing, rales, normal symmetric air entry.  Abdomin: soft, non-tender, without masses or organomegaly no shifting dullness or ascites. EXT:no erythema, induration, or nodules   Lab Results: Lab Results  Component Value Date   WBC 35.9* 04/30/2015   HGB 13.3 04/30/2015   HCT 41.4 04/30/2015   MCV 103.4* 04/30/2015   PLT 295 04/30/2015     Chemistry      Component Value Date/Time   NA 140 10/23/2014 0824   NA 140 02/08/2014 0700   K 4.7 10/23/2014 0824   K 4.7 02/08/2014 0700   CL 106 02/08/2014 0700   CO2 22 10/23/2014 0824   CO2 23 02/08/2014 0700   BUN 18.5 10/23/2014 0824   BUN 19 02/08/2014 0700   CREATININE 1.1 10/23/2014 0824   CREATININE 1.16 02/08/2014 0700      Component Value Date/Time   CALCIUM 9.1 10/23/2014 0824   CALCIUM 9.5 02/08/2014 0700   ALKPHOS 59 10/23/2014 0824   ALKPHOS 35* 09/06/2012 0742   AST 35* 10/23/2014 0824   AST 26 09/06/2012 0742   ALT 25 10/23/2014 0824   ALT 22 09/06/2012 0742   BILITOT 0.82 10/23/2014 0824   BILITOT 0.7 09/06/2012 0742      Impression and Plan:  81 year old gentleman with: 1. Lymphocytosis  with flow cytometry confirming the presence of CLL. His CT scan results on 04/17/2014 showed very small lymphadenopathy and no indication for treatment.    He currently has no indications to start treatment at this time. He does not report any painful or bulky adenopathy. He does not report any constitutional symptoms. I do not think his sinus congestion and cough is related to immune dysregulation. His white cell  count has increased some but not dramatic that requires intervention. Indications for treatment for CLL were reviewed again and has no indications for treatment at this time. He will continue on active surveillance at this time.  2. Follow-up: Will be in 6 months sooner if needed to.   [facility], MD 11/8/20169:31 AM

## 2015-05-07 ENCOUNTER — Ambulatory Visit (INDEPENDENT_AMBULATORY_CARE_PROVIDER_SITE_OTHER): Payer: Medicare Other | Admitting: *Deleted

## 2015-05-07 DIAGNOSIS — I4891 Unspecified atrial fibrillation: Secondary | ICD-10-CM

## 2015-05-07 DIAGNOSIS — I4892 Unspecified atrial flutter: Secondary | ICD-10-CM

## 2015-05-07 DIAGNOSIS — H6122 Impacted cerumen, left ear: Secondary | ICD-10-CM | POA: Diagnosis not present

## 2015-05-07 LAB — POCT INR: INR: 2

## 2015-05-14 ENCOUNTER — Ambulatory Visit (INDEPENDENT_AMBULATORY_CARE_PROVIDER_SITE_OTHER): Payer: Medicare Other | Admitting: *Deleted

## 2015-05-14 DIAGNOSIS — I495 Sick sinus syndrome: Secondary | ICD-10-CM

## 2015-05-14 NOTE — Progress Notes (Signed)
Remote pacemaker transmission.   

## 2015-05-21 ENCOUNTER — Encounter: Payer: Self-pay | Admitting: Allergy and Immunology

## 2015-05-21 ENCOUNTER — Ambulatory Visit (INDEPENDENT_AMBULATORY_CARE_PROVIDER_SITE_OTHER): Payer: Medicare Other | Admitting: Allergy and Immunology

## 2015-05-21 ENCOUNTER — Ambulatory Visit: Payer: Medicare Other | Admitting: Internal Medicine

## 2015-05-21 VITALS — BP 130/72 | HR 72 | Temp 97.6°F | Resp 20 | Ht 68.9 in | Wt 222.7 lb

## 2015-05-21 DIAGNOSIS — J309 Allergic rhinitis, unspecified: Secondary | ICD-10-CM | POA: Insufficient documentation

## 2015-05-21 DIAGNOSIS — R05 Cough: Secondary | ICD-10-CM

## 2015-05-21 DIAGNOSIS — J387 Other diseases of larynx: Secondary | ICD-10-CM | POA: Diagnosis not present

## 2015-05-21 DIAGNOSIS — K219 Gastro-esophageal reflux disease without esophagitis: Secondary | ICD-10-CM

## 2015-05-21 DIAGNOSIS — J3089 Other allergic rhinitis: Secondary | ICD-10-CM

## 2015-05-21 DIAGNOSIS — R059 Cough, unspecified: Secondary | ICD-10-CM

## 2015-05-21 MED ORDER — DEXLANSOPRAZOLE 60 MG PO CPDR: DELAYED_RELEASE_CAPSULE | ORAL | Status: DC

## 2015-05-21 MED ORDER — MONTELUKAST SODIUM 10 MG PO TABS: ORAL_TABLET | ORAL | Status: DC

## 2015-05-21 NOTE — Patient Instructions (Signed)
  1. Allergen avoidance measures  2. Treat inflammation:   A. OTC Rhinocort one spray each nostril one time per day  B. montelukast 10 mg one tablet one time per day  3. Treat reflux:   A. Dexilant 60 mg one tablet in a.m.  B. Pepcid 20 mg one tablet in PM  C.Taper off all caffeine and chocolate consumption  4. May continue Mucinex and tramadol and OTC antihistamine if needed  5. Blood - IgA/G/M, antitetanus antibody titer, anti-pneumococcal antibody titer  6. Discuss with Dr. Virgina Jock about Prevnar & Pneumovax  7. Return in 4 weeks or earlier if problem

## 2015-05-21 NOTE — Progress Notes (Signed)
[place]    NEW PATIENT NOTE  Referring Provider: [name], MD Primary Provider: [name], MD    Subjective:   Patient ID: Oscar Baker is a 81 y.o. male with a chief complaint of Other  who presents to the clinic with the following problems:  HPI Comments:  Oscar Baker presents this clinic on 05/21/2015 in evaluation of cough. Since October 2015 he's had a nagging cough and feelings other something stuck in his throat. He has throat clearing and postnasal drip and intermittent sore throat. He wakens at nighttime with a cough. He makes some thick sputum production. He also has sneezing and is blowing on an intermittent basis. He has no associated anosmia and no ugly nasal discharge. He does not develop any shortness of breath or chest tightness. Oscar Baker has had evaluation with Dr. Melvyn Novas at Dr. Ernesto Rutherford and has had a CT scan of his sinuses which did not identify a tremendous amount of sinus disease in October 2016 and has had rhinoscopic evaluation by Dr. Ernesto Rutherford earlier this year.  Apparently his laryngeal structures are normal. He had a chest CT scan in October 2015 which did not identify any source of cough. He had an IgE level above 300 and low levels of IgE antibodies directed against various aeroallergens August 2016 He's been treated with a multitude of different medications including prednisone which helps temporarily, Mucinex which helps, tramadol which helps, inhale steroids which did not help, nasal steroids which did not help, and Pepcid twice a day which does not help.   Past Medical History  Diagnosis Date  . Diabetes mellitus type 2, noninsulin dependent ([place])   . Hyperlipidemia   . Hypertension   . Sick sinus syndrome ([place])     a-Flutter with episodes of bradycardia; S/P Biotronik (serial number [id number])  . Pacemaker 02/08/2014    DR Lovena Le  . prostate ca dx'd 2006    xrt; prostatectomy  . CLL (chronic  lymphocytic leukemia) ([place])     Past Surgical History  Procedure Laterality Date  . Back surgery      disk  . Appendectomy    . Tonsillectomy    . Total knee arthroplasty    . Pacemaker insertion  02/08/2014    Biotronik (serial number [id number])  . Insert / replace / remove pacemaker  02/08/2014     DR Lovena Le  . Permanent pacemaker insertion N/A 02/08/2014    Procedure: PERMANENT PACEMAKER INSERTION;  Surgeon: Evans Lance, MD;  Location: [facility] CATH LAB;  Service: Cardiovascular;  Laterality: N/A;    Outpatient Encounter Prescriptions as of 05/21/2015  Medication Sig  . amLODipine (NORVASC) 10 MG tablet Take 10 mg by mouth at bedtime.   [name] aspirin 81 MG tablet Take 81 mg by mouth at bedtime.   [name] atorvastatin (LIPITOR) 20 MG tablet Take 20 mg by mouth at bedtime.  . chlorpheniramine (CHLOR-TRIMETON) 4 MG tablet Take 1-2 tablets by mouth daily at bedtime  . chlorpheniramine (CHLOR-TRIMETON) 4 MG tablet Take extra tablet by mouth every 4 hours as needed for drainage, throat tickle, and throat clearing  . dextromethorphan-guaiFENesin (MUCINEX DM) 30-600 MG per 12 hr tablet Take 1 tablet by mouth every 12 (twelve) hours as needed for cough (and congestion).   [name] Calcium (STOOL SOFTENER PO) Use as needed for constipation  . famotidine (PEPCID) 20 MG tablet Take 20 mg by mouth 2 (two) times daily.   . fenofibrate micronized (LOFIBRA)  200 MG capsule Take 1 capsule (200 mg total) by mouth daily before breakfast.  . glipiZIDE (GLUCOTROL XL) 10 MG 24 hr tablet Take 2.5 mg by mouth daily with breakfast.   . metFORMIN (GLUCOPHAGE) 1000 MG tablet Take 1,000 mg by mouth 2 (two) times daily with a meal.    . multivitamin (THERAGRAN) per tablet Take 1 tablet by mouth every morning.   . sitaGLIPtan (JANUVIA) 100 MG tablet Take 100 mg by mouth at bedtime.   . sodium chloride (OCEAN) 0.65 % SOLN nasal spray Place 1 spray into both nostrils as needed (nasal congestion).  . traMADol (ULTRAM) 50 MG  tablet Take 1 tablet by mouth every 4 hours as needed for cough or pain  . triamcinolone cream (KENALOG) 0.5 % Apply 1 application topically daily as needed (rash).   . vitamin B-12 (CYANOCOBALAMIN) 1000 MCG tablet Take 1,000 mcg by mouth every morning.   . warfarin (COUMADIN) 5 MG tablet TAKE 1 TABLET BY MOUTH DAILY (Patient taking differently: TAKE 1 TABLET BY MOUTH DAILY PER CLINIC)  . albuterol (PROVENTIL HFA;VENTOLIN HFA) 108 (90 BASE) MCG/ACT inhaler Inhale 2 puffs into the lungs every 4 (four) hours as needed for wheezing or shortness of breath.   No facility-administered encounter medications on file as of 05/21/2015.    No orders of the defined types were placed in this encounter.    Allergies  Allergen Reactions  . Altace [Ramipril] Other (See Comments)    "throat felt like had a knot in it"  . Codeine Nausea And Vomiting    Nausea and vomiting   . Simvastatin Other (See Comments)    Leg aches    Review of Systems  Constitutional: Negative.   HENT: Positive for congestion, postnasal drip, sneezing and sore throat. Negative for ear discharge, ear pain, mouth sores, nosebleeds, rhinorrhea, sinus pressure, tinnitus, trouble swallowing and voice change.   Eyes: Negative.   Respiratory: Positive for cough. Negative for choking, chest tightness, shortness of breath, wheezing and stridor.   Cardiovascular: Negative for chest pain, palpitations and leg swelling.  Gastrointestinal: Negative.   Endocrine: Negative.   Musculoskeletal: Negative.   Skin: Negative.   Neurological: Negative.   Hematological: Positive for adenopathy (CLL).    Family History  Problem Relation Age of Onset  . Emphysema Mother   . Allergic rhinitis Mother   . Heart attack Father   . Allergic rhinitis Brother   . Pulmonary fibrosis Brother     Social History   Social History  . Marital Status: Married    Spouse Name: N/A  . Number of Children: N/A  . Years of Education: N/A   Occupational  History  . Not on file.   Social History Main Topics  . Smoking status: Former Smoker -- 25 years    Types: Pipe, Cigars    Quit date: 11/25/1975  . Smokeless tobacco: Never Used  . Alcohol Use: 0.0 oz/week    0 Standard drinks or equivalent per week     Comment: occasional  . Drug Use: No  . Sexual Activity: Not on file   Other Topics Concern  . Not on file   Social History Narrative    Environmental and Social history  Lives in a house with a damp environment, no animals located inside the household, carpeting in the bedroom, no plastic on the bed or pillows, and no smoking ongoing with inside the household.   Objective:   Filed Vitals:   05/21/15 0931  BP: 130/72  Pulse: 72  Temp: 97.6 F (36.4 C)  Resp: 20   Height: 5' 8.9" (175 cm) Weight: 222 lb 10.6 oz (101 kg)  Physical Exam  Constitutional: He appears well-developed and well-nourished. No distress.  No throat clearing no raspy voice no coughing  HENT:  Head: Normocephalic and atraumatic. Head is without right periorbital erythema and without left periorbital erythema.  Right Ear: Tympanic membrane, external ear and ear canal normal. No drainage or tenderness. No foreign bodies. Tympanic membrane is not injected, not scarred, not perforated, not erythematous, not retracted and not bulging. No middle ear effusion.  Left Ear: Tympanic membrane, external ear and ear canal normal. No drainage or tenderness. No foreign bodies. Tympanic membrane is not injected, not scarred, not perforated, not erythematous, not retracted and not bulging.  No middle ear effusion.  Nose: Nose normal. No mucosal edema, rhinorrhea, nose lacerations or sinus tenderness.  No foreign bodies.  Mouth/Throat: Oropharynx is clear and moist. No oropharyngeal exudate, posterior oropharyngeal edema, posterior oropharyngeal erythema or tonsillar abscesses.  Hearing aid  Eyes: Lids are normal. Right eye exhibits no chemosis, no discharge and no  exudate. No foreign body present in the right eye. Left eye exhibits no chemosis, no discharge and no exudate. No foreign body present in the left eye. Right conjunctiva is not injected. Left conjunctiva is not injected.  Neck: Neck supple. No tracheal tenderness present. No tracheal deviation and no edema present. No thyroid mass and no thyromegaly present.  Cardiovascular: Normal rate, regular rhythm, S1 normal and S2 normal.  Exam reveals no gallop.   No murmur heard. Pulmonary/Chest: No accessory muscle usage or stridor. No respiratory distress. He has no wheezes. He has no rhonchi. He has no rales.  Abdominal: Soft. He exhibits no distension and no mass. There is no tenderness. There is no rebound and no guarding.  Musculoskeletal: He exhibits no edema or tenderness.  Lymphadenopathy:       Head (right side): No tonsillar adenopathy present.       Head (left side): No tonsillar adenopathy present.    He has no cervical adenopathy.  Neurological: He is alert.  Skin: No rash noted. He is not diaphoretic.  Psychiatric: He has a normal mood and affect. His behavior is normal.    Diagnostics:  Allergy skin tests were performed. He demonstrated hypersensitivity to house dust mite, trees, and mold  Spirometry was performed and demonstrated an FEV1 of 2.46 @ 88 % of predicted. Following the administration of nebulized albuterol his FEV1 did not improve  The patient had an Asthma Control Test with the following results:  .     Assessment and Plan:    1. LPRD (laryngopharyngeal reflux disease)   2. Cough   3. Other allergic rhinitis      1. Allergen avoidance measures  2. Treat inflammation:   A. OTC Rhinocort one spray each nostril one time per day  B. montelukast 10 mg one tablet one time per day  3. Treat reflux:   A. Dexilant 60 mg one tablet in a.m.  B. Pepcid 20 mg one tablet in PM  C.Taper off all caffeine and chocolate consumption  4. May continue Mucinex and tramadol  and OTC antihistamine if needed  5. Blood - IgA/G/M, antitetanus antibody titer, anti-pneumococcal antibody titer  6. Discuss with Dr. Virgina Jock about Prevnar & Pneumovax  7. Return in 4 weeks or earlier if problem  Oscar Baker has some multifactorial form of cough most likely from contribution of atopic  disease and reflux for which we'll get him to treat inflammation and treat reflux as stated above. To be complete, we will check his B-cell immune function with the blood tests mentioned above although certainly having a IgE level above 300 which suggest that there does not appear to be a tremendous amount of immunosuppression giving rise to low-grade mucosal bacterial growth and inflammation. He's not really sure about his vaccination status regarding Prevnar & Pneumovax and he'll discuss this with Dr. Virgina Jock.    Allena Katz, MD [place]

## 2015-05-28 ENCOUNTER — Ambulatory Visit (INDEPENDENT_AMBULATORY_CARE_PROVIDER_SITE_OTHER): Payer: Medicare Other

## 2015-05-28 ENCOUNTER — Ambulatory Visit (INDEPENDENT_AMBULATORY_CARE_PROVIDER_SITE_OTHER): Payer: Medicare Other | Admitting: Cardiovascular Disease

## 2015-05-28 ENCOUNTER — Encounter: Payer: Self-pay | Admitting: Cardiovascular Disease

## 2015-05-28 ENCOUNTER — Ambulatory Visit: Payer: Medicare Other | Admitting: Cardiovascular Disease

## 2015-05-28 VITALS — BP 130/68 | HR 60 | Ht 70.0 in | Wt 221.0 lb

## 2015-05-28 DIAGNOSIS — I482 Chronic atrial fibrillation, unspecified: Secondary | ICD-10-CM

## 2015-05-28 DIAGNOSIS — I4892 Unspecified atrial flutter: Secondary | ICD-10-CM | POA: Diagnosis not present

## 2015-05-28 DIAGNOSIS — I10 Essential (primary) hypertension: Secondary | ICD-10-CM

## 2015-05-28 DIAGNOSIS — C9110 Chronic lymphocytic leukemia of B-cell type not having achieved remission: Secondary | ICD-10-CM

## 2015-05-28 DIAGNOSIS — E785 Hyperlipidemia, unspecified: Secondary | ICD-10-CM | POA: Diagnosis not present

## 2015-05-28 DIAGNOSIS — I4891 Unspecified atrial fibrillation: Secondary | ICD-10-CM | POA: Diagnosis not present

## 2015-05-28 LAB — COMPREHENSIVE METABOLIC PANEL
ALT: 22 U/L (ref 9–46)
AST: 26 U/L (ref 10–35)
Albumin: 4 g/dL (ref 3.6–5.1)
Alkaline Phosphatase: 47 U/L (ref 40–115)
BILIRUBIN TOTAL: 0.6 mg/dL (ref 0.2–1.2)
BUN: 19 mg/dL (ref 7–25)
CO2: 24 mmol/L (ref 20–31)
CREATININE: 1.04 mg/dL (ref 0.70–1.11)
Calcium: 9.3 mg/dL (ref 8.6–10.3)
Chloride: 105 mmol/L (ref 98–110)
GLUCOSE: 98 mg/dL (ref 65–99)
Potassium: 4.5 mmol/L (ref 3.5–5.3)
SODIUM: 138 mmol/L (ref 135–146)
Total Protein: 6.9 g/dL (ref 6.1–8.1)

## 2015-05-28 LAB — LIPID PANEL
CHOL/HDL RATIO: 6.3 ratio — AB (ref <=5.0)
Cholesterol: 145 mg/dL (ref 125–200)
HDL: 23 mg/dL — ABNORMAL LOW (ref >=40)
LDL CALC: 101 mg/dL (ref <130)
Triglycerides: 107 mg/dL (ref <150)
VLDL: 21 mg/dL (ref <30)

## 2015-05-28 LAB — POCT INR: INR: 2.2

## 2015-05-28 MED ORDER — FENOFIBRATE MICRONIZED 200 MG PO CAPS: 200.0000 mg | ORAL_CAPSULE | Freq: Every day | ORAL | Status: DC

## 2015-05-28 MED ORDER — ATORVASTATIN CALCIUM 20 MG PO TABS: 20.0000 mg | ORAL_TABLET | Freq: Every day | ORAL | Status: DC

## 2015-05-28 NOTE — Progress Notes (Signed)
Oscar Baker Date of Birth  April 20, 1933 [place] [address]    [place]   [place], [place]  [address]    [place], [place]  [address] [phone]  Fax  [phone]  [phone]  Fax [phone]  Problem list: 1. Sick sinus syndrome with episodes of atrial flutter, frequent episodes of bradycardia - s/p pacer Aug. 2015.  2. Hypertension 3. Dyslipidemia 4. Diabetes mellitus 5. Arthroscopic knee surgery  History of Present Illness:  Oscar Baker is doing well.  He has asymptomatic bradycardia that we have followed since July 2000.  He has never had any syncope.  He had left knee surgery a year ago and right knee surgery in Dec. 2012.  No cardiac complaints.    He has done well since I last saw him.  He has bad knees and has not been walking much - he tries to avoid salt as much as possible.  He denies any chest pain, shortness breath, or syncope.  July 13, 2012 Oscar Baker is doing well.  No syncope or presyncope.  [name]   January 16, 2013:  Oscar Baker is doing well.  His HR continues to be slow.  He is asymptomatic - no syncope or presyncope.  Feb. 24, 2015:  Oscar Baker is doing well.  No syncope. No pre-syncope.  Getting his coumadin checked monthly.  No cp or dyspnea.  Had a URI this winter. We walked around the office and he increased his HR to 63.  He is able to get his heart rate up into the 70s when he is working out at [employer].  December 19, 2013:  Oscar Baker is here as a work in for fatigue and slow HR.    He has not had any episodes of syncope.   No chest pain , no dyspnea .  Dec. 30, 2015:  Oscar Baker is a 81 yo with hx of bradycardia and pacer placement, HTN, DM,  No CP or dyspnea. Has had URI,  Has been on several courses of Abx.  I suggested Zyrtec or claritan   Dec. 6, 2016:  Doing well .  No CP , no dyspnea.   Has had a chronic sinus drainage.  ?  Allergies, ? GERD       Current Outpatient Prescriptions on File Prior to Visit  Medication Sig Dispense Refill  . amLODipine (NORVASC) 10 MG tablet Take 10 mg by mouth at bedtime.   2  . aspirin 81 MG tablet Take 81 mg by mouth at bedtime.     [name] atorvastatin (LIPITOR) 20 MG tablet Take 20 mg by mouth at bedtime.  2  . dextromethorphan-guaiFENesin (MUCINEX DM) 30-600 MG per 12 hr tablet Take 1 tablet by mouth every 12 (twelve) hours as needed for cough (and congestion).     [name] Calcium (STOOL SOFTENER PO) Use as needed for constipation    . famotidine (PEPCID) 20 MG tablet Take 20 mg by mouth 2 (two) times daily.     . fenofibrate micronized (LOFIBRA) 200 MG capsule Take 1 capsule (200 mg total) by mouth daily before breakfast. 90 capsule 3  . glipiZIDE (GLUCOTROL XL) 10 MG 24 hr tablet Take 2.5 mg by mouth daily with breakfast.     . metFORMIN (GLUCOPHAGE) 1000 MG tablet Take 1,000 mg by mouth 2 (two) times daily with a meal.      .  montelukast (SINGULAIR) 10 MG tablet Take one tablet once daily. 30 tablet 5  . multivitamin (THERAGRAN) per tablet Take 1 tablet by mouth every morning.     . sitaGLIPtan (JANUVIA) 100 MG tablet Take 100 mg by mouth at bedtime.     . sodium chloride (OCEAN) 0.65 % SOLN nasal spray Place 1 spray into both nostrils as needed (nasal congestion).    . traMADol (ULTRAM) 50 MG tablet Take 1 tablet by mouth every 4 hours as needed for cough or pain 30 tablet 0  . triamcinolone cream (KENALOG) 0.5 % Apply 1 application topically daily as needed (rash).     . vitamin B-12 (CYANOCOBALAMIN) 1000 MCG tablet Take 1,000 mcg by mouth every morning.     . warfarin (COUMADIN) 5 MG tablet TAKE 1 TABLET BY MOUTH DAILY (Patient taking differently: TAKE 1 TABLET BY MOUTH DAILY PER CLINIC) 90 tablet 1  . albuterol (PROVENTIL HFA;VENTOLIN HFA) 108 (90 BASE) MCG/ACT inhaler Inhale 2 puffs into the lungs every 4 (four) hours as needed for wheezing or shortness of breath.    .  chlorpheniramine (CHLOR-TRIMETON) 4 MG tablet daily as needed. Take 1-2 tablets by mouth daily at bedtime    . dexlansoprazole (DEXILANT) 60 MG capsule Take one tablet every morning before breakfast. (Patient not taking: Reported on 05/28/2015) 30 capsule 5   No current facility-administered medications on file prior to visit.    Allergies  Allergen Reactions  . Altace [Ramipril] Other (See Comments)    "throat felt like had a knot in it"  . Codeine Nausea And Vomiting    Nausea and vomiting   . Simvastatin Other (See Comments)    Leg aches    Past Medical History  Diagnosis Date  . Diabetes mellitus type 2, noninsulin dependent ([place])   . Hyperlipidemia   . Hypertension   . Sick sinus syndrome ([place])     a-Flutter with episodes of bradycardia; S/P Biotronik (serial number [id number])  . Pacemaker 02/08/2014    DR Lovena Le  . prostate ca dx'd 2006    xrt; prostatectomy  . CLL (chronic lymphocytic leukemia) ([place])     Past Surgical History  Procedure Laterality Date  . Back surgery      disk  . Appendectomy    . Tonsillectomy    . Total knee arthroplasty    . Pacemaker insertion  02/08/2014    Biotronik (serial number [id number])  . Insert / replace / remove pacemaker  02/08/2014     DR Lovena Le  . Permanent pacemaker insertion N/A 02/08/2014    Procedure: PERMANENT PACEMAKER INSERTION;  Surgeon: Evans Lance, MD;  Location: [facility] CATH LAB;  Service: Cardiovascular;  Laterality: N/A;    History  Smoking status  . Former Smoker -- 25 years  . Types: Pipe, Cigars  . Quit date: 11/25/1975  Smokeless tobacco  . Never Used    History  Alcohol Use  . 0.0 oz/week  . 0 Standard drinks or equivalent per week    Comment: occasional    Family History  Problem Relation Age of Onset  . Emphysema Mother   . Allergic rhinitis Mother   . Heart attack Father   . Allergic rhinitis Brother   . Pulmonary fibrosis Brother     Reviw of Systems:  Reviewed in the HPI.  All other  systems are negative.  Physical Exam: Blood pressure 130/68, pulse 60, height 5\' 10"  (1.778 m), weight 221 lb (100.245 kg). General: Well developed, well nourished, in  no acute distress.  Head: Normocephalic, atraumatic, sclera non-icteric, mucus membranes are moist,   Neck: Supple. Carotids are 2 + without bruits. No JVD  Lungs: bilateral wheezing  Heart: regular rate  With normal  S1 S2. No murmurs, gallops or rubs.  Abdomen: Soft, non-tender, non-distended with normal bowel sounds. No hepatomegaly. No rebound/guarding. No masses.  Msk:  Strength and tone are normal  Extremities: No clubbing or cyanosis. Trace bilateral edema.  Distal pedal pulses are 2+ and equal bilaterally.  Neuro: Alert and oriented X 3. Moves all extremities spontaneously.  Psych:  Responds to questions appropriately with a normal affect.  ECG  Dec.  6, 2016: Atrial fibrillation with ventricular pacing at 66 bpm.  Assessment / Plan:   1. Sick sinus syndrome with episodes of atrial flutter, frequent episodes of bradycardia - s/p pacer Aug. 2015.  2. Hypertension- his blood pressure is well-controlled. Continue current medications. 3. Dyslipidemia- we will refill his fenofibrate. I have also offered to refill his atorvastatin and follow his lipids. Will check fasting liver enzymes, lipid panel and basic metabolic profile today. I'll see him again in one year.  4. Diabetes mellitus 5. Arthroscopic knee surgery   Nahser, Wonda Cheng, MD  05/28/2015 10:53 AM    [facility] [place],  [place], [place]  [address] Pager [phone] Phone: [phone]; Fax: [phone]   [facility]  [address] [place], [place]  [address] [phone]   Fax [phone]

## 2015-05-28 NOTE — Patient Instructions (Signed)
Medication Instructions:  Your physician recommends that you continue on your current medications as directed. Please refer to the Current Medication list given to you today.   Labwork: TODAY - cholesterol, liver, basic metabolic panel   Testing/Procedures: None Ordered   Follow-Up: Your physician wants you to follow-up in: 1 year with Dr. Nahser.  You will receive a reminder letter in the mail two months in advance. If you don't receive a letter, please call our office to schedule the follow-up appointment.   If you need a refill on your cardiac medications before your next appointment, please call your pharmacy.   Thank you for choosing [facility]! Nariyah Osias, RN [phone]   

## 2015-05-29 LAB — PNEUMOCOCCAL IM (14 SEROTYPE)
PNEUMO AB TYPE 14: 15.8 ug/mL (ref >1.3)
PNEUMO AB TYPE 19 (19F): 3.9 ug/mL (ref >1.3)
PNEUMO AB TYPE 1: 0.5 ug/mL — AB (ref >1.3)
PNEUMO AB TYPE 23 (23F): 0.2 ug/mL — AB (ref >1.3)
PNEUMO AB TYPE 4: 0.1 ug/mL — AB (ref >1.3)
PNEUMO AB TYPE 51 (7F): 0.3 ug/mL — AB (ref >1.3)
PNEUMO AB TYPE 56 (18C): 1.6 ug/mL (ref >1.3)
Pneumo Ab Type 26 (6B)*: 0.3 ug/mL — ABNORMAL LOW (ref >1.3)
Pneumo Ab Type 3*: 1.4 ug/mL (ref >1.3)
Pneumo Ab Type 57 (19A)*: 1.7 ug/mL (ref >1.3)
Pneumo Ab Type 68 (9V)*: 1.3 ug/mL — ABNORMAL LOW (ref >1.3)
Pneumo Ab Type 8*: <0.1 ug/mL — ABNORMAL LOW (ref >1.3)
Pneumo Ab Type 9 (9N)*: 3.8 ug/mL (ref >1.3)

## 2015-05-29 LAB — IGG, IGA, IGM
IgA/Immunoglobulin A, Serum: 126 mg/dL (ref 61–437)
IgG (Immunoglobin G), Serum: 737 mg/dL (ref 700–1600)
IgM (Immunoglobulin M), Srm: 62 mg/dL (ref 15–143)

## 2015-05-29 LAB — TETANUS ANTIBODY, IGG: TETANUS AB, IGG: 0.3 [IU]/mL (ref <0.10)

## 2015-05-30 LAB — CUP PACEART REMOTE DEVICE CHECK
Brady Statistic RV Percent Paced: 93 %
Date Time Interrogation Session: 20161208153856
Implantable Lead Serial Number: 29625633
Lead Channel Impedance Value: 488 Ohm
Lead Channel Pacing Threshold Pulse Width: 0.4 ms
MDC IDC LEAD IMPLANT DT: 20150820
MDC IDC LEAD LOCATION: 753860
MDC IDC LEAD MODEL: 350975
MDC IDC MSMT LEADCHNL RV PACING THRESHOLD AMPLITUDE: 0.7 V
MDC IDC MSMT LEADCHNL RV SENSING INTR AMPL: 7.2 mV
MDC IDC PG SERIAL: 68372291

## 2015-05-31 ENCOUNTER — Encounter: Payer: Self-pay | Admitting: Cardiology

## 2015-06-03 NOTE — Progress Notes (Signed)
PATIENT ADVISED RESULTS FAXED RESULT TO PCP WHO WILL GIVE PT VACCINATIONS ALSO

## 2015-06-25 DIAGNOSIS — Z7901 Long term (current) use of anticoagulants: Secondary | ICD-10-CM | POA: Diagnosis not present

## 2015-06-25 DIAGNOSIS — Z1389 Encounter for screening for other disorder: Secondary | ICD-10-CM | POA: Diagnosis not present

## 2015-06-25 DIAGNOSIS — D692 Other nonthrombocytopenic purpura: Secondary | ICD-10-CM | POA: Diagnosis not present

## 2015-06-25 DIAGNOSIS — E784 Other hyperlipidemia: Secondary | ICD-10-CM | POA: Diagnosis not present

## 2015-06-25 DIAGNOSIS — I272 Other secondary pulmonary hypertension: Secondary | ICD-10-CM | POA: Diagnosis not present

## 2015-06-25 DIAGNOSIS — I517 Cardiomegaly: Secondary | ICD-10-CM | POA: Diagnosis not present

## 2015-06-25 DIAGNOSIS — E119 Type 2 diabetes mellitus without complications: Secondary | ICD-10-CM | POA: Diagnosis not present

## 2015-06-25 DIAGNOSIS — K219 Gastro-esophageal reflux disease without esophagitis: Secondary | ICD-10-CM | POA: Diagnosis not present

## 2015-06-25 DIAGNOSIS — I10 Essential (primary) hypertension: Secondary | ICD-10-CM | POA: Diagnosis not present

## 2015-06-25 DIAGNOSIS — I4892 Unspecified atrial flutter: Secondary | ICD-10-CM | POA: Diagnosis not present

## 2015-06-25 DIAGNOSIS — Z6833 Body mass index (BMI) 33.0-33.9, adult: Secondary | ICD-10-CM | POA: Diagnosis not present

## 2015-06-25 DIAGNOSIS — C9100 Acute lymphoblastic leukemia not having achieved remission: Secondary | ICD-10-CM | POA: Diagnosis not present

## 2015-07-02 ENCOUNTER — Ambulatory Visit (INDEPENDENT_AMBULATORY_CARE_PROVIDER_SITE_OTHER): Payer: Medicare Other | Admitting: Allergy and Immunology

## 2015-07-02 ENCOUNTER — Encounter: Payer: Self-pay | Admitting: Allergy and Immunology

## 2015-07-02 VITALS — BP 130/64 | HR 68 | Resp 22

## 2015-07-02 DIAGNOSIS — J309 Allergic rhinitis, unspecified: Secondary | ICD-10-CM

## 2015-07-02 DIAGNOSIS — J45991 Cough variant asthma: Secondary | ICD-10-CM

## 2015-07-02 DIAGNOSIS — J387 Other diseases of larynx: Secondary | ICD-10-CM | POA: Diagnosis not present

## 2015-07-02 DIAGNOSIS — H1010 Acute atopic conjunctivitis, unspecified eye: Secondary | ICD-10-CM | POA: Diagnosis not present

## 2015-07-02 DIAGNOSIS — K219 Gastro-esophageal reflux disease without esophagitis: Secondary | ICD-10-CM

## 2015-07-02 NOTE — Progress Notes (Signed)
[facility]  Follow-up Note  Referring Provider: [name], MD Primary Provider: [name], MD Date of Office Visit: 07/02/2015  Subjective:   Oscar Baker is a 82 y.o. male who returns to the [facility] in re-evaluation of the following:  HPI Comments:  Oscar Baker returns to this clinic on 07/02/2015 in reevaluation of his cough and upper airway symptoms. He has no respiratory tract symptoms at all. He has resolved his cough, throat clearing, postnasal drip, intermittent sore throat, nasal congestion, and sneezing. This is the best that he has felt since October 2015. He's been consistently using montelukast and Pepcid and has performed allergen avoidance measures against house dust mite and does not consume any caffeine or chocolate. He has stopped his Rhinocort and is not using the [place] and has no need to use any Mucinex or tramadol or antihistamine. He did receive a pneumonia vaccination from Dr. Virgina Jock this January which I assume is Prevnar. He will be receiving the flu vaccine in 2 weeks   Current Outpatient Prescriptions on File Prior to Visit  Medication Sig Dispense Refill  . amLODipine (NORVASC) 10 MG tablet Take 10 mg by mouth at bedtime.   2  . aspirin 81 MG tablet Take 81 mg by mouth at bedtime.     [name] atorvastatin (LIPITOR) 20 MG tablet Take 1 tablet (20 mg total) by mouth at bedtime. 90 tablet 3  . chlorpheniramine (CHLOR-TRIMETON) 4 MG tablet daily as needed. Take 1-2 tablets by mouth daily at bedtime    . Docusate Calcium (STOOL SOFTENER PO) Use as needed for constipation    . famotidine (PEPCID) 20 MG tablet Take 20 mg by mouth 2 (two) times daily.     . fenofibrate micronized (LOFIBRA) 200 MG capsule Take 1 capsule (200 mg total) by mouth daily before breakfast. 90 capsule 3  . glipiZIDE (GLUCOTROL XL) 10 MG 24 hr tablet Take 2.5 mg by mouth daily with breakfast.     . metFORMIN (GLUCOPHAGE)  1000 MG tablet Take 1,000 mg by mouth 2 (two) times daily with a meal.      . montelukast (SINGULAIR) 10 MG tablet Take one tablet once daily. 30 tablet 5  . multivitamin (THERAGRAN) per tablet Take 1 tablet by mouth every morning.     . sitaGLIPtan (JANUVIA) 100 MG tablet Take 100 mg by mouth at bedtime.     . traMADol (ULTRAM) 50 MG tablet Take 1 tablet by mouth every 4 hours as needed for cough or pain 30 tablet 0  . triamcinolone cream (KENALOG) 0.5 % Apply 1 application topically daily as needed (rash).     . vitamin B-12 (CYANOCOBALAMIN) 1000 MCG tablet Take 1,000 mcg by mouth every morning.     . warfarin (COUMADIN) 5 MG tablet TAKE 1 TABLET BY MOUTH DAILY (Patient taking differently: TAKE 1 TABLET BY MOUTH DAILY PER CLINIC) 90 tablet 1  . albuterol (PROVENTIL HFA;VENTOLIN HFA) 108 (90 BASE) MCG/ACT inhaler Inhale 2 puffs into the lungs every 4 (four) hours as needed for wheezing or shortness of breath. Reported on 07/02/2015    . budesonide (RHINOCORT ALLERGY) 32 MCG/ACT nasal spray Place 1 spray into both nostrils daily. Reported on 07/02/2015    . dexlansoprazole (DEXILANT) 60 MG capsule Take one tablet every morning before breakfast. (Patient not taking: Reported on 05/28/2015) 30 capsule 5  . dextromethorphan-guaiFENesin (MUCINEX DM) 30-600 MG per 12 hr tablet Take 1 tablet by  mouth every 12 (twelve) hours as needed for cough (and congestion). Reported on 07/02/2015    . sodium chloride (OCEAN) 0.65 % SOLN nasal spray Place 1 spray into both nostrils as needed (nasal congestion). Reported on 07/02/2015     No current facility-administered medications on file prior to visit.    No orders of the defined types were placed in this encounter.    Past Medical History  Diagnosis Date  . Diabetes mellitus type 2, noninsulin dependent ([place])   . Hyperlipidemia   . Hypertension   . Sick sinus syndrome ([place])     a-Flutter with episodes of bradycardia; S/P Biotronik (serial number [id number])  .  Pacemaker 02/08/2014    DR Lovena Le  . prostate ca dx'd 2006    xrt; prostatectomy  . CLL (chronic lymphocytic leukemia) ([place])     Past Surgical History  Procedure Laterality Date  . Back surgery      disk  . Appendectomy    . Tonsillectomy    . Total knee arthroplasty    . Pacemaker insertion  02/08/2014    Biotronik (serial number [id number])  . Insert / replace / remove pacemaker  02/08/2014     DR Lovena Le  . Permanent pacemaker insertion N/A 02/08/2014    Procedure: PERMANENT PACEMAKER INSERTION;  Surgeon: Evans Lance, MD;  Location: [facility] CATH LAB;  Service: Cardiovascular;  Laterality: N/A;    Allergies  Allergen Reactions  . Altace [Ramipril] Other (See Comments)    "throat felt like had a knot in it"  . Codeine Nausea And Vomiting    Nausea and vomiting   . Simvastatin Other (See Comments)    Leg aches    Review of systems negative except as noted in HPI / PMHx or noted below:  Review of Systems  Constitutional: Negative.   HENT: Negative.   Eyes: Negative.   Respiratory: Negative.   Cardiovascular: Negative.   Gastrointestinal: Negative.   Genitourinary: Negative.   Musculoskeletal: Negative.   Skin: Negative.   Neurological: Negative.   Endo/Heme/Allergies: Negative.   Psychiatric/Behavioral: Negative.      Objective:   Filed Vitals:   07/02/15 0906  BP: 130/64  Pulse: 68  Resp: 22          Physical Exam  Constitutional: He is well-developed, well-nourished, and in no distress. No distress.  HENT:  Head: Normocephalic. Head is without right periorbital erythema and without left periorbital erythema.  Nose: Nose normal. No mucosal edema or rhinorrhea.  Mouth/Throat: Oropharynx is clear and moist and mucous membranes are normal. No oropharyngeal exudate.  Hearing aids  Eyes: Conjunctivae and lids are normal. Pupils are equal, round, and reactive to light.  Neck: Trachea normal. No tracheal deviation present. No thyromegaly present.   Cardiovascular: Normal rate, regular rhythm, S1 normal, S2 normal and normal heart sounds.   No murmur heard. Pulmonary/Chest: Effort normal. No stridor. No tachypnea. No respiratory distress. He has no wheezes. He has no rales. He exhibits no tenderness.  Abdominal: Soft. He exhibits no distension and no mass. There is no hepatosplenomegaly. There is no tenderness. There is no rebound and no guarding.  Musculoskeletal: He exhibits no edema or tenderness.  Lymphadenopathy:       Head (right side): No tonsillar adenopathy present.       Head (left side): No tonsillar adenopathy present.    He has no cervical adenopathy.    He has no axillary adenopathy.  Neurological: He is alert. Gait normal.  Skin: No  rash noted. He is not diaphoretic. No erythema. No pallor. Nails show no clubbing.  Psychiatric: Mood and affect normal.    Diagnostics: Results of blood tests obtained on 06/03/2015 identified an Ig G level of 737 mg/deciliter, IgA of 126, IgM of 62, relatively low levels of antibodies directed against various serotypes of pneumococcus although he had 6 out of 14 serotypes antibody levels in normal range, and he had a tetanus antibody titer of 0.30 international unit/mL    Assessment and Plan:   1. LPRD (laryngopharyngeal reflux disease)   2. Allergic rhinoconjunctivitis   3.  Possible component of Cough variant asthma      1. Allergen avoidance measures  2. Treat inflammation:    A. Continue montelukast 10 mg one tablet one time per day  3. Treat reflux:   A. Pepcid 20 mg one tablet in PM  B. Remain off all caffeine and chocolate consumption  4. May continue Mucinex and tramadol and OTC antihistamine if needed  5. Return in 6 months or earlier if problem  Paola is doing great and we'll continue to have him use a combination of montelukast and Pepcid and perform allergen avoidance measures and see what happens over the course of the next 6 months. I've asked him to contact me  should he redevelop his cough in the future. Obviously his B cell immune system is apparently intact based upon the blood test results noted above.  Allena Katz, MD [place]

## 2015-07-02 NOTE — Patient Instructions (Signed)
  1. Allergen avoidance measures  2. Treat inflammation:    A. Continue montelukast 10 mg one tablet one time per day  3. Treat reflux:   A. Pepcid 20 mg one tablet in PM  B. Remain off all caffeine and chocolate consumption  4. May continue Mucinex and tramadol and OTC antihistamine if needed  5. Return in 6 months or earlier if problem

## 2015-07-09 ENCOUNTER — Ambulatory Visit (INDEPENDENT_AMBULATORY_CARE_PROVIDER_SITE_OTHER): Payer: Medicare Other

## 2015-07-09 DIAGNOSIS — I4892 Unspecified atrial flutter: Secondary | ICD-10-CM | POA: Diagnosis not present

## 2015-07-09 DIAGNOSIS — I4891 Unspecified atrial fibrillation: Secondary | ICD-10-CM

## 2015-07-09 LAB — POCT INR: INR: 1.7

## 2015-07-22 DIAGNOSIS — J301 Allergic rhinitis due to pollen: Secondary | ICD-10-CM | POA: Diagnosis not present

## 2015-07-22 DIAGNOSIS — H6122 Impacted cerumen, left ear: Secondary | ICD-10-CM | POA: Diagnosis not present

## 2015-07-22 DIAGNOSIS — H6523 Chronic serous otitis media, bilateral: Secondary | ICD-10-CM | POA: Diagnosis not present

## 2015-07-24 HISTORY — PX: CATARACT EXTRACTION: SUR2

## 2015-07-26 DIAGNOSIS — H2513 Age-related nuclear cataract, bilateral: Secondary | ICD-10-CM | POA: Diagnosis not present

## 2015-07-26 DIAGNOSIS — Z0100 Encounter for examination of eyes and vision without abnormal findings: Secondary | ICD-10-CM | POA: Diagnosis not present

## 2015-08-01 DIAGNOSIS — H6122 Impacted cerumen, left ear: Secondary | ICD-10-CM | POA: Diagnosis not present

## 2015-08-01 DIAGNOSIS — Z23 Encounter for immunization: Secondary | ICD-10-CM | POA: Diagnosis not present

## 2015-08-01 DIAGNOSIS — H6062 Unspecified chronic otitis externa, left ear: Secondary | ICD-10-CM | POA: Diagnosis not present

## 2015-08-06 ENCOUNTER — Ambulatory Visit (INDEPENDENT_AMBULATORY_CARE_PROVIDER_SITE_OTHER): Payer: Medicare Other | Admitting: *Deleted

## 2015-08-06 DIAGNOSIS — I4891 Unspecified atrial fibrillation: Secondary | ICD-10-CM | POA: Diagnosis not present

## 2015-08-06 DIAGNOSIS — I4892 Unspecified atrial flutter: Secondary | ICD-10-CM | POA: Diagnosis not present

## 2015-08-06 LAB — POCT INR: INR: 2.2

## 2015-08-08 DIAGNOSIS — H25041 Posterior subcapsular polar age-related cataract, right eye: Secondary | ICD-10-CM | POA: Diagnosis not present

## 2015-08-08 DIAGNOSIS — H25811 Combined forms of age-related cataract, right eye: Secondary | ICD-10-CM | POA: Diagnosis not present

## 2015-08-08 DIAGNOSIS — H2511 Age-related nuclear cataract, right eye: Secondary | ICD-10-CM | POA: Diagnosis not present

## 2015-08-13 ENCOUNTER — Ambulatory Visit (INDEPENDENT_AMBULATORY_CARE_PROVIDER_SITE_OTHER): Payer: Medicare Other | Admitting: *Deleted

## 2015-08-13 DIAGNOSIS — I495 Sick sinus syndrome: Secondary | ICD-10-CM

## 2015-08-14 ENCOUNTER — Ambulatory Visit (INDEPENDENT_AMBULATORY_CARE_PROVIDER_SITE_OTHER): Payer: Medicare Other | Admitting: Podiatry

## 2015-08-14 ENCOUNTER — Encounter: Payer: Self-pay | Admitting: Podiatry

## 2015-08-14 VITALS — BP 144/68 | HR 70

## 2015-08-14 DIAGNOSIS — L608 Other nail disorders: Secondary | ICD-10-CM | POA: Diagnosis not present

## 2015-08-14 DIAGNOSIS — S99921A Unspecified injury of right foot, initial encounter: Secondary | ICD-10-CM | POA: Diagnosis not present

## 2015-08-14 NOTE — Patient Instructions (Signed)
Seen for deformed nail on 3rd right that has gotten cracked skin due to micro trauma in shoes. No associated swelling or infection noted. Nail debrided and cleansed with Iodine. Return as needed.

## 2015-08-14 NOTE — Progress Notes (Signed)
Sore toe on 3rd right.  Subjective: 82 year old male presents complaining of bleeding toe 3rd right. Patient denies any recent injury. Has had lawn mower injury many years ago and injured the same nail. Since then the nail has been deformed.   Objective: Thick dystrophic nail 3rd right with fissured skin with old blood. No edema, erythema, or associated drainage. Neurovascular status are within normal   Assessment: Discolored, thick deformed nail 3rd right. Injury to 3rd digit. Fissured skin at distal end beneath the 3rd nail..  Plan: Reviewed clinical findings and available treatment options. Debrided nail and flattened nail bed. Betadine dressing applied. If problem reoccur, it may need total matrixectomy of the 3rd nail right. Return as needed.

## 2015-08-14 NOTE — Progress Notes (Signed)
Remote pacemaker transmission.   

## 2015-08-28 DIAGNOSIS — M25512 Pain in left shoulder: Secondary | ICD-10-CM | POA: Diagnosis not present

## 2015-08-28 DIAGNOSIS — Z471 Aftercare following joint replacement surgery: Secondary | ICD-10-CM | POA: Diagnosis not present

## 2015-08-28 DIAGNOSIS — S46112A Strain of muscle, fascia and tendon of long head of biceps, left arm, initial encounter: Secondary | ICD-10-CM | POA: Diagnosis not present

## 2015-08-28 DIAGNOSIS — Z96641 Presence of right artificial hip joint: Secondary | ICD-10-CM | POA: Diagnosis not present

## 2015-09-03 DIAGNOSIS — H6122 Impacted cerumen, left ear: Secondary | ICD-10-CM | POA: Diagnosis not present

## 2015-09-06 ENCOUNTER — Other Ambulatory Visit: Payer: Self-pay | Admitting: Cardiovascular Disease

## 2015-09-06 ENCOUNTER — Encounter: Payer: Self-pay | Admitting: Cardiology

## 2015-09-06 LAB — CUP PACEART REMOTE DEVICE CHECK
Brady Statistic RV Percent Paced: 95 %
Implantable Lead Implant Date: 20150820
Implantable Lead Model: 350975
Implantable Lead Serial Number: 29625633
Lead Channel Pacing Threshold Amplitude: 0.7 V
Lead Channel Pacing Threshold Pulse Width: 0.4 ms
Lead Channel Sensing Intrinsic Amplitude: 7.8 mV
MDC IDC LEAD LOCATION: 753860
MDC IDC MSMT LEADCHNL RV IMPEDANCE VALUE: 507 Ohm
MDC IDC PG SERIAL: 68372291
MDC IDC SESS DTM: 20170317134321

## 2015-09-10 ENCOUNTER — Ambulatory Visit (INDEPENDENT_AMBULATORY_CARE_PROVIDER_SITE_OTHER): Payer: Medicare Other | Admitting: *Deleted

## 2015-09-10 DIAGNOSIS — I4891 Unspecified atrial fibrillation: Secondary | ICD-10-CM | POA: Diagnosis not present

## 2015-09-10 DIAGNOSIS — I4892 Unspecified atrial flutter: Secondary | ICD-10-CM

## 2015-09-10 LAB — POCT INR: INR: 2.6

## 2015-09-21 HISTORY — PX: CATARACT EXTRACTION: SUR2

## 2015-09-26 DIAGNOSIS — H6692 Otitis media, unspecified, left ear: Secondary | ICD-10-CM | POA: Diagnosis not present

## 2015-09-26 DIAGNOSIS — H6062 Unspecified chronic otitis externa, left ear: Secondary | ICD-10-CM | POA: Diagnosis not present

## 2015-10-01 DIAGNOSIS — H6062 Unspecified chronic otitis externa, left ear: Secondary | ICD-10-CM | POA: Diagnosis not present

## 2015-10-02 DIAGNOSIS — Z471 Aftercare following joint replacement surgery: Secondary | ICD-10-CM | POA: Diagnosis not present

## 2015-10-02 DIAGNOSIS — Z96641 Presence of right artificial hip joint: Secondary | ICD-10-CM | POA: Diagnosis not present

## 2015-10-02 DIAGNOSIS — M25551 Pain in right hip: Secondary | ICD-10-CM | POA: Diagnosis not present

## 2015-10-15 ENCOUNTER — Ambulatory Visit (INDEPENDENT_AMBULATORY_CARE_PROVIDER_SITE_OTHER): Payer: Medicare Other | Admitting: *Deleted

## 2015-10-15 ENCOUNTER — Encounter: Payer: Self-pay | Admitting: Internal Medicine

## 2015-10-15 DIAGNOSIS — I517 Cardiomegaly: Secondary | ICD-10-CM | POA: Diagnosis not present

## 2015-10-15 DIAGNOSIS — I4891 Unspecified atrial fibrillation: Secondary | ICD-10-CM

## 2015-10-15 DIAGNOSIS — E11311 Type 2 diabetes mellitus with unspecified diabetic retinopathy with macular edema: Secondary | ICD-10-CM | POA: Diagnosis not present

## 2015-10-15 DIAGNOSIS — I495 Sick sinus syndrome: Secondary | ICD-10-CM | POA: Diagnosis not present

## 2015-10-15 DIAGNOSIS — H6062 Unspecified chronic otitis externa, left ear: Secondary | ICD-10-CM | POA: Diagnosis not present

## 2015-10-15 DIAGNOSIS — D692 Other nonthrombocytopenic purpura: Secondary | ICD-10-CM | POA: Diagnosis not present

## 2015-10-15 DIAGNOSIS — I4892 Unspecified atrial flutter: Secondary | ICD-10-CM | POA: Diagnosis not present

## 2015-10-15 DIAGNOSIS — I10 Essential (primary) hypertension: Secondary | ICD-10-CM | POA: Diagnosis not present

## 2015-10-15 DIAGNOSIS — M791 Myalgia: Secondary | ICD-10-CM | POA: Diagnosis not present

## 2015-10-15 DIAGNOSIS — I272 Other secondary pulmonary hypertension: Secondary | ICD-10-CM | POA: Diagnosis not present

## 2015-10-15 DIAGNOSIS — Z7901 Long term (current) use of anticoagulants: Secondary | ICD-10-CM | POA: Diagnosis not present

## 2015-10-15 DIAGNOSIS — Z6833 Body mass index (BMI) 33.0-33.9, adult: Secondary | ICD-10-CM | POA: Diagnosis not present

## 2015-10-15 DIAGNOSIS — B9562 Methicillin resistant Staphylococcus aureus infection as the cause of diseases classified elsewhere: Secondary | ICD-10-CM | POA: Diagnosis not present

## 2015-10-15 DIAGNOSIS — Z950 Presence of cardiac pacemaker: Secondary | ICD-10-CM | POA: Diagnosis not present

## 2015-10-15 DIAGNOSIS — L988 Other specified disorders of the skin and subcutaneous tissue: Secondary | ICD-10-CM | POA: Diagnosis not present

## 2015-10-15 LAB — POCT INR: INR: 2.3

## 2015-10-17 ENCOUNTER — Encounter: Payer: Self-pay | Admitting: Cardiovascular Disease

## 2015-10-17 DIAGNOSIS — H2512 Age-related nuclear cataract, left eye: Secondary | ICD-10-CM | POA: Diagnosis not present

## 2015-10-17 DIAGNOSIS — H25812 Combined forms of age-related cataract, left eye: Secondary | ICD-10-CM | POA: Diagnosis not present

## 2015-10-29 ENCOUNTER — Other Ambulatory Visit (HOSPITAL_BASED_OUTPATIENT_CLINIC_OR_DEPARTMENT_OTHER): Payer: Medicare Other

## 2015-10-29 ENCOUNTER — Ambulatory Visit (HOSPITAL_BASED_OUTPATIENT_CLINIC_OR_DEPARTMENT_OTHER): Payer: Medicare Other | Admitting: Oncology

## 2015-10-29 ENCOUNTER — Telehealth: Payer: Self-pay | Admitting: Oncology

## 2015-10-29 VITALS — BP 134/61 | HR 72 | Temp 98.2°F | Resp 18 | Ht 70.0 in | Wt 224.9 lb

## 2015-10-29 DIAGNOSIS — C9110 Chronic lymphocytic leukemia of B-cell type not having achieved remission: Secondary | ICD-10-CM | POA: Diagnosis not present

## 2015-10-29 LAB — CBC WITH DIFFERENTIAL/PLATELET
BASO%: 0.4 % (ref 0.0–2.0)
BASOS ABS: 0.2 10*3/uL — AB (ref 0.0–0.1)
EOS ABS: 0.5 10*3/uL (ref 0.0–0.5)
EOS%: 0.9 % (ref 0.0–7.0)
HEMATOCRIT: 43.1 % (ref 38.4–49.9)
HGB: 13.9 g/dL (ref 13.0–17.1)
LYMPH%: 84.7 % — ABNORMAL HIGH (ref 14.0–49.0)
MCH: 33.5 pg — AB (ref 27.2–33.4)
MCHC: 32.3 g/dL (ref 32.0–36.0)
MCV: 104 fL — AB (ref 79.3–98.0)
MONO#: 0.8 10*3/uL (ref 0.1–0.9)
MONO%: 1.6 % (ref 0.0–14.0)
NEUT#: 6.4 10*3/uL (ref 1.5–6.5)
NEUT%: 12.4 % — AB (ref 39.0–75.0)
Platelets: 276 10*3/uL (ref 140–400)
RBC: 4.15 10*6/uL — ABNORMAL LOW (ref 4.20–5.82)
RDW: 20.9 % — ABNORMAL HIGH (ref 11.0–14.6)
WBC: 52.1 10*3/uL (ref 4.0–10.3)
lymph#: 44.1 10*3/uL — ABNORMAL HIGH (ref 0.9–3.3)

## 2015-10-29 LAB — COMPREHENSIVE METABOLIC PANEL
ALBUMIN: 4 g/dL (ref 3.5–5.0)
ALK PHOS: 53 U/L (ref 40–150)
ALT: 27 U/L (ref 0–55)
ANION GAP: 8 meq/L (ref 3–11)
AST: 30 U/L (ref 5–34)
BILIRUBIN TOTAL: 0.62 mg/dL (ref 0.20–1.20)
BUN: 20.9 mg/dL (ref 7.0–26.0)
CO2: 24 meq/L (ref 22–29)
Calcium: 9.2 mg/dL (ref 8.4–10.4)
Chloride: 108 mEq/L (ref 98–109)
Creatinine: 1.2 mg/dL (ref 0.7–1.3)
EGFR: 56 mL/min/{1.73_m2} — AB (ref >90)
Glucose: 164 mg/dl — ABNORMAL HIGH (ref 70–140)
POTASSIUM: 4.5 meq/L (ref 3.5–5.1)
Sodium: 141 mEq/L (ref 136–145)
TOTAL PROTEIN: 6.9 g/dL (ref 6.4–8.3)

## 2015-10-29 LAB — TECHNOLOGIST REVIEW

## 2015-10-29 NOTE — Telephone Encounter (Signed)
Gave pt appt & avs °

## 2015-10-29 NOTE — Progress Notes (Signed)
Hematology and Oncology Follow Up Visit  Oscar Baker [id number] [birth year] 82 y.o. 10/29/2015 8:38 AM Oscar Baker, MDRusso, Oscar Reichmann, MD   Principle Diagnosis: 82 year old gentleman with CLL diagnosed in August 2015. He presented with leukocytosis and mild lymphadenopathy.  Current therapy: Observation and surveillance:  Interim History:  [name] presents today for a follow-up visit. Since the last visit, he is doing very well without any recent changes in his health. He does not report any constitutional symptoms of fevers or chills or sweats. He does not report any painful adenopathy or change in his performance status. He continues to perform activities of daily living without any major decline. He does report weight loss in the last year but is very minimal and is close to 5 pounds. He reports that his appetite have not changed and feels relatively that his taste is about the same. He continues to drive and have not really notice any major changes. He denied any recurrent infections or recent hospitalizations.  He does not report any headaches or blurry vision or syncope. He does not report any chest pain, palpitation or shortness of breath. He does not report any nausea, vomiting, abdominal pain. He does not report any frequency urgency or hesitancy. He does not report any skeletal complaints. Rest of his review of systems unremarkable.  Medications: I have reviewed the patient's current medications.  Current Outpatient Prescriptions  Medication Sig Dispense Refill  . amLODipine (NORVASC) 10 MG tablet Take 10 mg by mouth at bedtime.   2  . aspirin 81 MG tablet Take 81 mg by mouth at bedtime.     [name] atorvastatin (LIPITOR) 20 MG tablet Take 1 tablet (20 mg total) by mouth at bedtime. 90 tablet 3  . fenofibrate micronized (LOFIBRA) 200 MG capsule Take 1 capsule (200 mg total) by mouth daily before breakfast. 90 capsule 3  . GLIPIZIDE XL 2.5 MG 24 hr tablet   2  . metFORMIN (GLUCOPHAGE) 1000  MG tablet Take 1,000 mg by mouth 2 (two) times daily with a meal.      . multivitamin (THERAGRAN) per tablet Take 1 tablet by mouth every morning.     . sitaGLIPtan (JANUVIA) 100 MG tablet Take 100 mg by mouth at bedtime.     . vitamin B-12 (CYANOCOBALAMIN) 1000 MCG tablet Take 1,000 mcg by mouth every morning.     . warfarin (COUMADIN) 5 MG tablet TAKE 1 TABLET BY MOUTH ONCE DAILY 90 tablet 1  . Docusate Calcium (STOOL SOFTENER PO) Reported on 10/29/2015    . famotidine (PEPCID) 20 MG tablet Take 20 mg by mouth 2 (two) times daily. Reported on 10/29/2015    . triamcinolone cream (KENALOG) 0.5 % Apply 1 application topically daily as needed (rash). Reported on 10/29/2015     No current facility-administered medications for this visit.     Allergies:  Allergies  Allergen Reactions  . Altace [Ramipril] Other (See Comments)    "throat felt like had a knot in it"  . Codeine Nausea And Vomiting    Nausea and vomiting   . Simvastatin Other (See Comments)    Leg aches    Past Medical History, Surgical history, Social history, and Family History were reviewed and updated.   Physical Exam: Blood pressure 134/61, pulse 72, temperature 98.2 F (36.8 C), temperature source Oral, resp. rate 18, height 5\' 10"  (1.778 m), weight 224 lb 14.4 oz (102.014 kg), SpO2 97 %. ECOG: 0 General appearance: Pleasant-appearing gentleman without distress. Head: Normocephalic, without  obvious abnormality no oral ulcers or lesions. Neck: no adenopathy no thyroid masses. Lymph nodes: Cervical, supraclavicular, and axillary nodes normal. Heart:regular rate and rhythm, S1, S2 normal, no murmur, click, rub or gallop Lung:chest clear, no wheezing, rales, normal symmetric air entry.  Abdomin: soft, non-tender, without masses or organomegaly no rebound or guarding. EXT:no erythema, induration, or nodules   Lab Results: Lab Results  Component Value Date   WBC 52.1* 10/29/2015   HGB 13.9 10/29/2015   HCT 43.1  10/29/2015   MCV 104.0* 10/29/2015   PLT 276 10/29/2015     Chemistry      Component Value Date/Time   NA 138 05/28/2015 1059   NA 141 04/30/2015 0907   K 4.5 05/28/2015 1059   K 4.8 04/30/2015 0907   CL 105 05/28/2015 1059   CO2 24 05/28/2015 1059   CO2 24 04/30/2015 0907   BUN 19 05/28/2015 1059   BUN 19.6 04/30/2015 0907   CREATININE 1.04 05/28/2015 1059   CREATININE 1.0 04/30/2015 0907   CREATININE 1.16 02/08/2014 0700      Component Value Date/Time   CALCIUM 9.3 05/28/2015 1059   CALCIUM 9.2 04/30/2015 0907   ALKPHOS 47 05/28/2015 1059   ALKPHOS 60 04/30/2015 0907   AST 26 05/28/2015 1059   AST 28 04/30/2015 0907   ALT 22 05/28/2015 1059   ALT 22 04/30/2015 0907   BILITOT 0.6 05/28/2015 1059   BILITOT 0.63 04/30/2015 0907      Impression and Plan:  82 year old gentleman with: 1. Lymphocytosis: The clinical findings are consistent with CLL. His CT scan results on 04/17/2014 showed very small lymphadenopathy and no indication for treatment.   His laboratory data, physical examination and clinical presentation has not changed dramatically in the last 6 months. There is no indication to start treatment for this condition. The natural course of this disease was reviewed again with the patient today and indication for treatment were reviewed. If he started developing any progressive symptoms including painful adenopathy, cytopenias or recurrent infections we will consider treatment at that time. Rapid increase in his white cell count could also be an indication of accelerated disease. At this time there is no evidence of that with a doubling time that is over 12 months.  For the time being we'll continue with observation and surveillance.  2. Follow-up: Will be in 6 months sooner if needed to.   [facility], MD 5/9/20178:38 AM

## 2015-11-05 DIAGNOSIS — H6122 Impacted cerumen, left ear: Secondary | ICD-10-CM | POA: Diagnosis not present

## 2015-11-05 DIAGNOSIS — H903 Sensorineural hearing loss, bilateral: Secondary | ICD-10-CM | POA: Diagnosis not present

## 2015-11-05 DIAGNOSIS — H6063 Unspecified chronic otitis externa, bilateral: Secondary | ICD-10-CM | POA: Diagnosis not present

## 2015-11-12 ENCOUNTER — Ambulatory Visit (INDEPENDENT_AMBULATORY_CARE_PROVIDER_SITE_OTHER): Payer: Medicare Other | Admitting: *Deleted

## 2015-11-12 DIAGNOSIS — I495 Sick sinus syndrome: Secondary | ICD-10-CM

## 2015-11-12 NOTE — Progress Notes (Signed)
Remote pacemaker transmission.   

## 2015-11-26 ENCOUNTER — Ambulatory Visit (INDEPENDENT_AMBULATORY_CARE_PROVIDER_SITE_OTHER): Payer: Medicare Other

## 2015-11-26 DIAGNOSIS — I4891 Unspecified atrial fibrillation: Secondary | ICD-10-CM | POA: Diagnosis not present

## 2015-11-26 DIAGNOSIS — I4892 Unspecified atrial flutter: Secondary | ICD-10-CM | POA: Diagnosis not present

## 2015-11-26 LAB — POCT INR: INR: 1.8

## 2015-11-29 LAB — CUP PACEART REMOTE DEVICE CHECK
Brady Statistic RV Percent Paced: 96 %
Date Time Interrogation Session: 20170609122845
Implantable Lead Implant Date: 20150820
Implantable Lead Location: 753860
Implantable Lead Model: 350975
Implantable Lead Serial Number: 29625633
Lead Channel Impedance Value: 527 Ohm
Lead Channel Pacing Threshold Amplitude: 0.6 V
Lead Channel Pacing Threshold Pulse Width: 0.4 ms
Lead Channel Sensing Intrinsic Amplitude: 7.6 mV
Lead Channel Setting Pacing Amplitude: 2.4 V
Lead Channel Setting Pacing Pulse Width: 0.4 ms
Pulse Gen Model: 394934
Pulse Gen Serial Number: 68372291

## 2015-12-10 ENCOUNTER — Encounter: Payer: Self-pay | Admitting: Cardiology

## 2015-12-10 DIAGNOSIS — L570 Actinic keratosis: Secondary | ICD-10-CM | POA: Diagnosis not present

## 2015-12-31 ENCOUNTER — Encounter: Payer: Self-pay | Admitting: Allergy and Immunology

## 2015-12-31 ENCOUNTER — Ambulatory Visit (INDEPENDENT_AMBULATORY_CARE_PROVIDER_SITE_OTHER): Payer: Medicare Other

## 2015-12-31 ENCOUNTER — Ambulatory Visit (INDEPENDENT_AMBULATORY_CARE_PROVIDER_SITE_OTHER): Payer: Medicare Other | Admitting: Allergy and Immunology

## 2015-12-31 VITALS — BP 136/68 | HR 68 | Resp 22

## 2015-12-31 DIAGNOSIS — I4892 Unspecified atrial flutter: Secondary | ICD-10-CM

## 2015-12-31 DIAGNOSIS — J309 Allergic rhinitis, unspecified: Secondary | ICD-10-CM

## 2015-12-31 DIAGNOSIS — I4891 Unspecified atrial fibrillation: Secondary | ICD-10-CM

## 2015-12-31 DIAGNOSIS — K219 Gastro-esophageal reflux disease without esophagitis: Secondary | ICD-10-CM

## 2015-12-31 DIAGNOSIS — R05 Cough: Secondary | ICD-10-CM | POA: Diagnosis not present

## 2015-12-31 DIAGNOSIS — J387 Other diseases of larynx: Secondary | ICD-10-CM | POA: Diagnosis not present

## 2015-12-31 DIAGNOSIS — R059 Cough, unspecified: Secondary | ICD-10-CM

## 2015-12-31 DIAGNOSIS — H1010 Acute atopic conjunctivitis, unspecified eye: Secondary | ICD-10-CM | POA: Diagnosis not present

## 2015-12-31 LAB — POCT INR: INR: 2.1

## 2015-12-31 NOTE — Progress Notes (Signed)
Follow-up Note  Referring Provider: [name], MD Primary Provider: [name], MD Date of Office Visit: 12/31/2015  Subjective:   Oscar Baker (DOB: 01/24/1933) is a 82 y.o. male who returns to the [facility] on 12/31/2015 in re-evaluation of the following:  HPI: Valentino returns to this clinic in reevaluation of his cough probably secondary to LPR with possible component of atopic respiratory disease. Since I've last seen him in this clinic approximately 6 months ago he has completely resolved all of his respiratory tract symptoms and has tapered off all his medications for reflux and respiratory inflammation.    Medication List           amLODipine 10 MG tablet  Commonly known as:  NORVASC  Take 10 mg by mouth at bedtime.     aspirin 81 MG tablet  Take 81 mg by mouth at bedtime.     atorvastatin 20 MG tablet  Commonly known as:  LIPITOR  Take 1 tablet (20 mg total) by mouth at bedtime.     fenofibrate micronized 200 MG capsule  Commonly known as:  LOFIBRA  Take 1 capsule (200 mg total) by mouth daily before breakfast.     GLIPIZIDE XL 2.5 MG 24 hr tablet  Generic drug:  glipiZIDE     metFORMIN 1000 MG tablet  Commonly known as:  GLUCOPHAGE  Take 1,000 mg by mouth 2 (two) times daily with a meal.     multivitamin per tablet  Take 1 tablet by mouth every morning.     sitaGLIPtin 100 MG tablet  Commonly known as:  JANUVIA  Take 100 mg by mouth at bedtime.     STOOL SOFTENER PO  Reported on 10/29/2015     triamcinolone cream 0.5 %  Commonly known as:  KENALOG  Apply 1 application topically daily as needed (rash). Reported on 10/29/2015     vitamin B-12 1000 MCG tablet  Commonly known as:  CYANOCOBALAMIN  Take 1,000 mcg by mouth every morning.     warfarin 5 MG tablet  Commonly known as:  COUMADIN  TAKE 1 TABLET BY MOUTH ONCE DAILY        Past Medical History  Diagnosis Date  . Diabetes mellitus type 2, noninsulin dependent ([place])     . Hyperlipidemia   . Hypertension   . Sick sinus syndrome ([place])     a-Flutter with episodes of bradycardia; S/P Biotronik (serial number [id number])  . Pacemaker 02/08/2014    DR Lovena Le  . prostate ca dx'd 2006    xrt; prostatectomy  . CLL (chronic lymphocytic leukemia) ([place])     Past Surgical History  Procedure Laterality Date  . Back surgery      disk  . Appendectomy    . Tonsillectomy    . Total knee arthroplasty    . Pacemaker insertion  02/08/2014    Biotronik (serial number [id number])  . Insert / replace / remove pacemaker  02/08/2014     DR Lovena Le  . Permanent pacemaker insertion N/A 02/08/2014    Procedure: PERMANENT PACEMAKER INSERTION;  Surgeon: Evans Lance, MD;  Location: [facility] C[facility] LAB;  Service: Cardiovascular;  Laterality: N/A;    Allergies  Allergen Reactions  . Altace [Ramipril] Other (See Comments)    "throat felt like had a knot in it"  . Codeine Nausea And Vomiting    Nausea and vomiting   . Simvastatin Other (See Comments)    Leg aches  Review of systems negative except as noted in HPI / PMHx or noted below:  Review of Systems  Constitutional: Negative.   HENT: Negative.   Eyes: Negative.   Respiratory: Negative.   Cardiovascular: Negative.   Gastrointestinal: Negative.   Genitourinary: Negative.   Musculoskeletal: Negative.   Skin: Negative.   Neurological: Negative.   Endo/Heme/Allergies: Negative.   Psychiatric/Behavioral: Negative.      Objective:   Filed Vitals:   12/31/15 0821  BP: 136/68  Pulse: 68  Resp: 22          Physical Exam  Constitutional: He is well-developed, well-nourished, and in no distress.  HENT:  Head: Normocephalic.  Right Ear: Tympanic membrane, external ear and ear canal normal.  Left Ear: Tympanic membrane, external ear and ear canal normal.  Nose: Nose normal. No mucosal edema or rhinorrhea.  Mouth/Throat: Uvula is midline, oropharynx is clear and moist and mucous membranes are normal. No  oropharyngeal exudate.  Eyes: Conjunctivae are normal.  Neck: Trachea normal. No tracheal tenderness present. No tracheal deviation present. No thyromegaly present.  Cardiovascular: Normal rate, regular rhythm, S1 normal, S2 normal and normal heart sounds.   No murmur heard. Pulmonary/Chest: Breath sounds normal. No stridor. No respiratory distress. He has no wheezes. He has no rales.  Musculoskeletal: He exhibits no edema.  Lymphadenopathy:       Head (right side): No tonsillar adenopathy present.       Head (left side): No tonsillar adenopathy present.    He has no cervical adenopathy.  Neurological: He is alert. Gait normal.  Skin: No rash noted. He is not diaphoretic. No erythema. Nails show no clubbing.  Psychiatric: Mood and affect normal.    Diagnostics: None    Assessment and Plan:   1. Cough   2. LPRD (laryngopharyngeal reflux disease)   3. Allergic rhinoconjunctivitis     1. Allergen avoidance measures  2. Treat reflux by remaining off all caffeine and chocolate consumption  3. Use OTC antihistamine if needed  4. Return to clinic if needed.  Lake has done very well utilizing therapy directed against inflammation and reflux and now that he is healed up it does not appear as though he's requires any medications on a consistent or ongoing basis and I will assume that his respiratory tract inflammation has now resolved and he will not need to come back to this clinic for a follow-up visit. Certainly  If he develops recurrent problems as he moves forward he is welcome to return to this clinic for further evaluation.  Allena Katz, MD [place]

## 2015-12-31 NOTE — Patient Instructions (Addendum)
  1. Allergen avoidance measures  2. Treat reflux remaining off all caffeine and chocolate consumption  3. Use OTC antihistamine if needed  4. Return to clinic if needed.

## 2016-01-28 ENCOUNTER — Ambulatory Visit (INDEPENDENT_AMBULATORY_CARE_PROVIDER_SITE_OTHER): Payer: Medicare Other

## 2016-01-28 DIAGNOSIS — I4891 Unspecified atrial fibrillation: Secondary | ICD-10-CM

## 2016-01-28 DIAGNOSIS — I4892 Unspecified atrial flutter: Secondary | ICD-10-CM

## 2016-01-28 LAB — POCT INR: INR: 2.5

## 2016-02-04 DIAGNOSIS — L570 Actinic keratosis: Secondary | ICD-10-CM | POA: Diagnosis not present

## 2016-02-11 DIAGNOSIS — H6122 Impacted cerumen, left ear: Secondary | ICD-10-CM | POA: Diagnosis not present

## 2016-02-11 DIAGNOSIS — H6062 Unspecified chronic otitis externa, left ear: Secondary | ICD-10-CM | POA: Diagnosis not present

## 2016-02-25 ENCOUNTER — Ambulatory Visit (INDEPENDENT_AMBULATORY_CARE_PROVIDER_SITE_OTHER): Payer: Medicare Other | Admitting: *Deleted

## 2016-02-25 DIAGNOSIS — I4892 Unspecified atrial flutter: Secondary | ICD-10-CM | POA: Diagnosis not present

## 2016-02-25 DIAGNOSIS — E784 Other hyperlipidemia: Secondary | ICD-10-CM | POA: Diagnosis not present

## 2016-02-25 DIAGNOSIS — E119 Type 2 diabetes mellitus without complications: Secondary | ICD-10-CM | POA: Diagnosis not present

## 2016-02-25 DIAGNOSIS — Z125 Encounter for screening for malignant neoplasm of prostate: Secondary | ICD-10-CM | POA: Diagnosis not present

## 2016-02-25 DIAGNOSIS — I4891 Unspecified atrial fibrillation: Secondary | ICD-10-CM | POA: Diagnosis not present

## 2016-02-25 LAB — POCT INR: INR: 2.4

## 2016-03-03 DIAGNOSIS — I495 Sick sinus syndrome: Secondary | ICD-10-CM | POA: Diagnosis not present

## 2016-03-03 DIAGNOSIS — Z6832 Body mass index (BMI) 32.0-32.9, adult: Secondary | ICD-10-CM | POA: Diagnosis not present

## 2016-03-03 DIAGNOSIS — D692 Other nonthrombocytopenic purpura: Secondary | ICD-10-CM | POA: Diagnosis not present

## 2016-03-03 DIAGNOSIS — I4892 Unspecified atrial flutter: Secondary | ICD-10-CM | POA: Diagnosis not present

## 2016-03-03 DIAGNOSIS — I272 Other secondary pulmonary hypertension: Secondary | ICD-10-CM | POA: Diagnosis not present

## 2016-03-03 DIAGNOSIS — C9100 Acute lymphoblastic leukemia not having achieved remission: Secondary | ICD-10-CM | POA: Diagnosis not present

## 2016-03-03 DIAGNOSIS — E119 Type 2 diabetes mellitus without complications: Secondary | ICD-10-CM | POA: Diagnosis not present

## 2016-03-03 DIAGNOSIS — I517 Cardiomegaly: Secondary | ICD-10-CM | POA: Diagnosis not present

## 2016-03-03 DIAGNOSIS — Z950 Presence of cardiac pacemaker: Secondary | ICD-10-CM | POA: Diagnosis not present

## 2016-03-03 DIAGNOSIS — Z0000 Encounter for general adult medical examination without abnormal findings: Secondary | ICD-10-CM | POA: Diagnosis not present

## 2016-03-03 DIAGNOSIS — Z23 Encounter for immunization: Secondary | ICD-10-CM | POA: Diagnosis not present

## 2016-03-03 DIAGNOSIS — Z7901 Long term (current) use of anticoagulants: Secondary | ICD-10-CM | POA: Diagnosis not present

## 2016-03-10 DIAGNOSIS — H6062 Unspecified chronic otitis externa, left ear: Secondary | ICD-10-CM | POA: Diagnosis not present

## 2016-03-10 DIAGNOSIS — H6122 Impacted cerumen, left ear: Secondary | ICD-10-CM | POA: Diagnosis not present

## 2016-03-11 ENCOUNTER — Other Ambulatory Visit: Payer: Self-pay | Admitting: Cardiovascular Disease

## 2016-03-26 DIAGNOSIS — M1612 Unilateral primary osteoarthritis, left hip: Secondary | ICD-10-CM | POA: Diagnosis not present

## 2016-03-26 DIAGNOSIS — M25551 Pain in right hip: Secondary | ICD-10-CM | POA: Diagnosis not present

## 2016-03-26 DIAGNOSIS — Z96641 Presence of right artificial hip joint: Secondary | ICD-10-CM | POA: Diagnosis not present

## 2016-03-31 DIAGNOSIS — L570 Actinic keratosis: Secondary | ICD-10-CM | POA: Diagnosis not present

## 2016-04-07 ENCOUNTER — Ambulatory Visit (INDEPENDENT_AMBULATORY_CARE_PROVIDER_SITE_OTHER): Payer: Medicare Other | Admitting: *Deleted

## 2016-04-07 ENCOUNTER — Encounter: Payer: Self-pay | Admitting: Internal Medicine

## 2016-04-07 ENCOUNTER — Ambulatory Visit (INDEPENDENT_AMBULATORY_CARE_PROVIDER_SITE_OTHER): Payer: Medicare Other | Admitting: Internal Medicine

## 2016-04-07 VITALS — BP 132/70 | HR 60 | Ht 70.0 in | Wt 224.0 lb

## 2016-04-07 DIAGNOSIS — I482 Chronic atrial fibrillation, unspecified: Secondary | ICD-10-CM

## 2016-04-07 DIAGNOSIS — I4891 Unspecified atrial fibrillation: Secondary | ICD-10-CM | POA: Diagnosis not present

## 2016-04-07 DIAGNOSIS — I4892 Unspecified atrial flutter: Secondary | ICD-10-CM | POA: Diagnosis not present

## 2016-04-07 DIAGNOSIS — I495 Sick sinus syndrome: Secondary | ICD-10-CM | POA: Diagnosis not present

## 2016-04-07 LAB — POCT INR: INR: 2.6

## 2016-04-07 NOTE — Patient Instructions (Signed)
Medication Instructions:  Your physician recommends that you continue on your current medications as directed. Please refer to the Current Medication list given to you today.   Labwork: None Ordered    Testing/Procedures: None Ordered    Follow-Up: Your physician wants you to follow-up in: 1 year with Dr. Lovena Le.  You will receive a reminder letter in the mail two months in advance. If you don't receive a letter, please call our office to schedule the follow-up appointment.  Remote monitoring is used to monitor your Pacemaker of ICD from home. This monitoring reduces the number of office visits required to check your device to one time per year. It allows [place] to keep an eye on the functioning of your device to ensure it is working properly. You are scheduled for a device check from home on 07/07/16. You may send your transmission at any time that day. If you have a wireless device, the transmission will be sent automatically. After your physician reviews your transmission, you will receive a postcard with your next transmission date.   Any Other Special Instructions Will Be Listed Below (If Applicable).     If you need a refill on your cardiac medications before your next appointment, please call your pharmacy.

## 2016-04-07 NOTE — Progress Notes (Signed)
HPI Mr. Kmiecik returns today for ongoing followup of bradycardia in the setting of atrial fibrillation, s/p PPM insertion. Since his implant he has been stable. He denies syncope. He has minimal peripheral edema. He is pending left hip replacement. Allergies  Allergen Reactions  . Altace [Ramipril] Other (See Comments)    "throat felt like had a knot in it"  . Codeine Nausea And Vomiting    Nausea and vomiting   . Simvastatin Other (See Comments)    Leg aches     Current Outpatient Prescriptions  Medication Sig Dispense Refill  . amLODipine (NORVASC) 10 MG tablet Take 10 mg by mouth at bedtime.   2  . aspirin 81 MG tablet Take 81 mg by mouth at bedtime.     [name] atorvastatin (LIPITOR) 20 MG tablet Take 1 tablet (20 mg total) by mouth at bedtime. 90 tablet 3  . Docusate Calcium (STOOL SOFTENER PO) Reported on 10/29/2015    . fenofibrate micronized (LOFIBRA) 200 MG capsule Take 1 capsule (200 mg total) by mouth daily before breakfast. 90 capsule 3  . GLIPIZIDE XL 2.5 MG 24 hr tablet   2  . metFORMIN (GLUCOPHAGE) 1000 MG tablet Take 1,000 mg by mouth 2 (two) times daily with a meal.      . multivitamin (THERAGRAN) per tablet Take 1 tablet by mouth every morning.     . sitaGLIPtan (JANUVIA) 100 MG tablet Take 100 mg by mouth at bedtime.     . triamcinolone cream (KENALOG) 0.5 % Apply 1 application topically daily as needed (rash). Reported on 10/29/2015    . vitamin B-12 (CYANOCOBALAMIN) 1000 MCG tablet Take 1,000 mcg by mouth every morning.     . warfarin (COUMADIN) 5 MG tablet Take as directed by coumadin clinic 90 tablet 0   No current facility-administered medications for this visit.      Past Medical History:  Diagnosis Date  . CLL (chronic lymphocytic leukemia) ([place])   . Diabetes mellitus type 2, noninsulin dependent ([place])   . Hyperlipidemia   . Hypertension   . Pacemaker 02/08/2014   DR Lovena Le  . prostate ca dx'd 2006   xrt; prostatectomy  . Sick sinus syndrome ([facility])      a-Flutter with episodes of bradycardia; S/P Biotronik (serial number [id number])    ROS:   All systems reviewed and negative except as noted in the HPI.   Past Surgical History:  Procedure Laterality Date  . APPENDECTOMY    . BACK SURGERY     disk  . INSERT / REPLACE / REMOVE PACEMAKER  02/08/2014    DR Lovena Le  . PACEMAKER INSERTION  02/08/2014   Biotronik (serial number [id number])  . PERMANENT PACEMAKER INSERTION N/A 02/08/2014   Procedure: PERMANENT PACEMAKER INSERTION;  Surgeon: Evans Lance, MD;  Location: [facility] CATH LAB;  Service: Cardiovascular;  Laterality: N/A;  . TONSILLECTOMY    . TOTAL KNEE ARTHROPLASTY       Family History  Problem Relation Age of Onset  . Emphysema Mother   . Allergic rhinitis Mother   . Heart attack Father   . Allergic rhinitis Brother   . Pulmonary fibrosis Brother      Social History   Social History  . Marital status: Married    Spouse name: N/A  . Number of children: N/A  . Years of education: N/A   Occupational History  . Not on file.   Social History Main Topics  . Smoking status: Former Smoker  Years: 25.00    Types: Pipe, Cigars    Quit date: 11/25/1975  . Smokeless tobacco: Never Used  . Alcohol use 0.0 oz/week     Comment: occasional  . Drug use: No  . Sexual activity: Not on file   Other Topics Concern  . Not on file   Social History Narrative  . No narrative on file     BP 132/70   Pulse 60   Ht 5\' 10"  (1.778 m)   Wt 224 lb (101.6 kg)   SpO2 98%   BMI 32.14 kg/m   Physical Exam:  Well appearing 82 yo man, NAD HEENT: Unremarkable Neck:  6 cm JVD, no thyromegally Back:  No CVA tenderness Lungs:  Clear with no wheezes HEART:  IRegular brady rhythm, no murmurs, no rubs, no clicks Abd:  soft, positive bowel sounds, no organomegally, no rebound, no guarding Ext:  2 plus pulses, no edema, no cyanosis, no clubbing Skin:  No rashes no nodules Neuro:  CN II through XII intact, motor grossly intact  PPM  - normal device function except for minimal noise on ventricular lead.   Assess/Plan: 1. Atrial fib - his ventricular rate is well controlled. He will continue his current meds. 2. CHB - He is asymptomatic, pacing 95% of the time. 3. PPM - his Biotronik VVI PM is working normally. Will recheck in several months.  Mikle Bosworth.D.

## 2016-04-13 DIAGNOSIS — M1612 Unilateral primary osteoarthritis, left hip: Secondary | ICD-10-CM | POA: Diagnosis not present

## 2016-04-14 DIAGNOSIS — H6122 Impacted cerumen, left ear: Secondary | ICD-10-CM | POA: Diagnosis not present

## 2016-04-14 DIAGNOSIS — H6063 Unspecified chronic otitis externa, bilateral: Secondary | ICD-10-CM | POA: Diagnosis not present

## 2016-04-22 NOTE — H&P (Signed)
TOTAL HIP ADMISSION H&P  Patient is admitted for left total hip arthroplasty.  Subjective:  Chief Complaint:    Left hip primary OA / pain  HPI: Oscar Baker, 82 y.o. male, has a history of pain and functional disability in the left hip(s) due to arthritis and patient has failed non-surgical conservative treatments for greater than 12 weeks to include NSAID's and/or analgesics and activity modification.  Onset of symptoms was gradual starting 1+ years ago with gradually worsening course since that time.The patient noted no past surgery on the left hip(s).  Patient currently rates pain in the left hip at 8 out of 10 with activity. Patient has night pain, worsening of pain with activity and weight bearing, trendelenberg gait, pain that interfers with activities of daily living, pain with passive range of motion, crepitus and joint swelling. Patient has evidence of periarticular osteophytes and joint space narrowing by imaging studies. This condition presents safety issues increasing the risk of Baker.  There is no current active infection.   Risks, benefits and expectations were discussed with the patient.  Risks including but not limited to the risk of anesthesia, blood clots, nerve damage, blood vessel damage, failure of the prosthesis, infection and up to and including death.  Patient understand the risks, benefits and expectations and wishes to proceed with surgery.   PCP: [name], MD  D/C Plans:      Home  Post-op Meds:       No Rx given  Tranexamic Acid:      To be given - IV   Decadron:      is to be given  FYI:     Coumadin     Patient Active Problem List   Diagnosis Date Noted  . LPRD (laryngopharyngeal reflux disease) 05/21/2015  . Other allergic rhinitis 05/21/2015  . Obesity 12/12/2014  . Upper airway cough syndrome 12/11/2014  . Cough variant asthma 07/27/2014  . Wheezing 06/20/2014  . Symptomatic bradycardia - s/p Biotronik (serial number [id number]) 02/08/2014  .  Diabetes mellitus type 2, noninsulin dependent ([place])   . Edema of foot 03/06/2013  . Traumatic ecchymosis of right foot 03/06/2013  . Bone spur 12/30/2012  . Pain of toe of left foot 12/30/2012  . Hyperlipidemia 07/13/2012  . HTN (hypertension) 04/12/2012  . Sick sinus syndrome ([place]) 08/17/2011  . Atrial fibrillation ([place]) 09/16/2010   Past Medical History:  Diagnosis Date  . CLL (chronic lymphocytic leukemia) ([place])   . Diabetes mellitus type 2, noninsulin dependent ([place])   . Hyperlipidemia   . Hypertension   . Pacemaker 02/08/2014   DR Lovena Le  . prostate ca dx'd 2006   xrt; prostatectomy  . Sick sinus syndrome [facility])    a-Flutter with episodes of bradycardia; S/P Biotronik (serial number [id number])    Past Surgical History:  Procedure Laterality Date  . APPENDECTOMY    . BACK SURGERY     disk  . INSERT / REPLACE / REMOVE PACEMAKER  02/08/2014    DR Lovena Le  . PACEMAKER INSERTION  02/08/2014   Biotronik (serial number [id number])  . PERMANENT PACEMAKER INSERTION N/A 02/08/2014   Procedure: PERMANENT PACEMAKER INSERTION;  Surgeon: Evans Lance, MD;  Location: [facility] CATH LAB;  Service: Cardiovascular;  Laterality: N/A;  . TONSILLECTOMY    . TOTAL KNEE ARTHROPLASTY      No prescriptions prior to admission.   Allergies  Allergen Reactions  . Altace [Ramipril] Other (See Comments)    "throat felt like had a knot  in it"  . Codeine Nausea And Vomiting    Nausea and vomiting   . Simvastatin Other (See Comments)    Leg aches    Social History  Substance Use Topics  . Smoking status: Former Smoker    Years: 25.00    Types: Pipe, Cigars    Quit date: 11/25/1975  . Smokeless tobacco: Never Used  . Alcohol use 0.0 oz/week     Comment: occasional    Family History  Problem Relation Age of Onset  . Emphysema Mother   . Allergic rhinitis Mother   . Heart attack Father   . Allergic rhinitis Brother   . Pulmonary fibrosis Brother      Review of Systems  Constitutional:  Negative.   HENT: Negative.   Eyes: Negative.   Respiratory: Negative.   Cardiovascular: Negative.   Gastrointestinal: Negative.   Genitourinary: Negative.   Musculoskeletal: Positive for joint pain.  Skin: Negative.   Neurological: Negative.   Endo/Heme/Allergies: Positive for environmental allergies.  Psychiatric/Behavioral: Negative.     Objective:  Physical Exam  Constitutional: He is oriented to person, place, and time. He appears well-developed.  HENT:  Head: Normocephalic.  Eyes: Pupils are equal, round, and reactive to light.  Neck: Neck supple. No JVD present. No tracheal deviation present. No thyromegaly present.  Cardiovascular: Normal rate, regular rhythm, normal heart sounds and intact distal pulses.   Hx of a-fib and pacemaker.  Respiratory: Effort normal and breath sounds normal. No respiratory distress. He has no wheezes.  GI: Soft. There is no tenderness. There is no guarding.  Musculoskeletal:       Left hip: He exhibits decreased range of motion, decreased strength, tenderness and bony tenderness. He exhibits no swelling, no deformity and no laceration.  Lymphadenopathy:    He has no cervical adenopathy.  Neurological: He is alert and oriented to person, place, and time.  Skin: Skin is warm and dry.  Psychiatric: He has a normal mood and affect.      Labs:  Estimated body mass index is 32.14 kg/m as calculated from the following:   Height as of 04/07/16: 5\' 10"  (1.778 m).   Weight as of 04/07/16: 101.6 kg (224 lb).   Imaging Review Plain radiographs demonstrate severe degenerative joint disease of the left hip(s). The bone quality appears to be good for age and reported activity level.  Assessment/Plan:  End stage arthritis, left hip(s)  The patient history, physical examination, clinical judgement of the provider and imaging studies are consistent with end stage degenerative joint disease of the left hip(s) and total hip arthroplasty is deemed  medically necessary. The treatment options including medical management, injection therapy, arthroscopy and arthroplasty were discussed at length. The risks and benefits of total hip arthroplasty were presented and reviewed. The risks due to aseptic loosening, infection, stiffness, dislocation/subluxation,  thromboembolic complications and other imponderables were discussed.  The patient acknowledged the explanation, agreed to proceed with the plan and consent was signed. Patient is being admitted for inpatient treatment for surgery, pain control, PT, OT, prophylactic antibiotics, VTE prophylaxis, progressive ambulation and ADL's and discharge planning.The patient is planning to be discharged home.     West Pugh Liev Brockbank   PA-C  04/22/2016, 1:53 PM

## 2016-05-04 ENCOUNTER — Ambulatory Visit (INDEPENDENT_AMBULATORY_CARE_PROVIDER_SITE_OTHER): Payer: Medicare Other | Admitting: *Deleted

## 2016-05-04 DIAGNOSIS — I4891 Unspecified atrial fibrillation: Secondary | ICD-10-CM | POA: Diagnosis not present

## 2016-05-04 DIAGNOSIS — I4892 Unspecified atrial flutter: Secondary | ICD-10-CM | POA: Diagnosis not present

## 2016-05-04 LAB — POCT INR: INR: 2.4

## 2016-05-04 NOTE — Patient Instructions (Addendum)
Oscar Baker  05/04/2016   Your procedure is scheduled on:05-12-16   Report to [facility]  Entrance take [facility]  elevators to 3rd floor to  [place] at  0830  AM.  Call this number if you have problems the morning of surgery [phone]   Remember: ONLY 1 PERSON MAY GO WITH YOU TO SHORT STAY TO GET  READY MORNING OF [name].  Do not eat food or drink liquids :After Midnight.     Take these medicines the morning of surgery with A SIP OF WATER: Fenofibrate. DO NOT TAKE ANY DIABETIC MEDICATIONS DAY OF YOUR SURGERY                               You may not have any metal on your body including hair pins and              piercings  Do not wear jewelry, make-up, lotions, powders or perfumes, deodorant             Do not wear nail polish.  Do not shave  48 hours prior to surgery.              Men may shave face and neck.   Do not bring valuables to the hospital. [place].  Contacts, dentures or bridgework may not be worn into surgery.  Leave suitcase in the car. After surgery it may be brought to your room.     Patients discharged the day of surgery will not be allowed to drive home.  Name and phone number of your driver:[name] -spouse [phone] cell  Special Instructions: N/A              Please read over the following fact sheets you were given: _____________________________________________________________________             [facility] - Preparing for Surgery Before surgery, you can play an important role.  Because skin is not sterile, your skin needs to be as free of germs as possible.  You can reduce the number of germs on your skin by washing with CHG (chlorahexidine gluconate) soap before surgery.  CHG is an antiseptic cleaner which kills germs and bonds with the skin to continue killing germs even after washing. Please DO NOT use if you have an allergy to CHG or antibacterial  soaps.  If your skin becomes reddened/irritated stop using the CHG and inform your nurse when you arrive at Short Stay. Do not shave (including legs and underarms) for at least 48 hours prior to the first CHG shower.  You may shave your face/neck. Please follow these instructions carefully:  1.  Shower with CHG Soap the night before surgery and the  morning of Surgery.  2.  If you choose to wash your hair, wash your hair first as usual with your  normal  shampoo.  3.  After you shampoo, rinse your hair and body thoroughly to remove the  shampoo.                           4.  Use CHG as you would any other liquid soap.  You can apply chg directly  to the  skin and wash                       Gently with a scrungie or clean washcloth.  5.  Apply the CHG Soap to your body ONLY FROM THE NECK DOWN.   Do not use on face/ open                           Wound or open sores. Avoid contact with eyes, ears mouth and genitals (private parts).                       Wash face,  Genitals (private parts) with your normal soap.             6.  Wash thoroughly, paying special attention to the area where your surgery  will be performed.  7.  Thoroughly rinse your body with warm water from the neck down.  8.  DO NOT shower/wash with your normal soap after using and rinsing off  the CHG Soap.                9.  Pat yourself dry with a clean towel.            10.  Wear clean pajamas.            11.  Place clean sheets on your bed the night of your first shower and do not  sleep with pets. Day of Surgery : Do not apply any lotions/deodorants the morning of surgery.  Please wear clean clothes to the hospital/surgery center.  FAILURE TO FOLLOW THESE INSTRUCTIONS MAY RESULT IN THE CANCELLATION OF YOUR SURGERY    ________________________________________________________________________ How to Manage Your Diabetes Before and After Surgery  Why is it important to control my blood sugar before and after  surgery? . Improving blood sugar levels before and after surgery helps healing and can limit problems. . A way of improving blood sugar control is eating a healthy diet by: o  Eating less sugar and carbohydrates o  Increasing activity/exercise o  Talking with your doctor about reaching your blood sugar goals . High blood sugars (greater than 180 mg/dL) can raise your risk of infections and slow your recovery, so you will need to focus on controlling your diabetes during the weeks before surgery. . Make sure that the doctor who takes care of your diabetes knows about your planned surgery including the date and location.  How do I manage my blood sugar before surgery? . Check your blood sugar at least 4 times a day, starting 2 days before surgery, to make sure that the level is not too high or low. o Check your blood sugar the morning of your surgery when you wake up and every 2 hours until you get to the Short Stay unit. . If your blood sugar is less than 70 mg/dL, you will need to treat for low blood sugar: o Do not take insulin. o Treat a low blood sugar (less than 70 mg/dL) with  cup of clear juice (cranberry or apple), 4 glucose tablets, OR glucose gel. o Recheck blood sugar in 15 minutes after treatment (to make sure it is greater than 70 mg/dL). If your blood sugar is not greater than 70 mg/dL on recheck, call [phone] for further instructions. . Report your blood sugar to the short stay nurse when you get to Short Stay.  . If you  are admitted to the hospital after surgery: o Your blood sugar will be checked by the staff and you will probably be given insulin after surgery (instead of oral diabetes medicines) to make sure you have good blood sugar levels. o The goal for blood sugar control after surgery is 80-180 mg/dL.   WHAT DO I DO ABOUT MY DIABETES MEDICATION?  [name] Do not take oral diabetes medicines (pills) the morning of surgery.      . The day of surgery, do not take other  diabetes injectables, including Byetta (exenatide), Bydureon (exenatide ER), Victoza (liraglutide), or Trulicity (dulaglutide).  . If your CBG is greater than 220 mg/dL, you may take  of your sliding scale  . (correction) dose of insulin.     Patient Signature:  Date:   Nurse Signature:  Date:   Reviewed and Endorsed by [facility] Patient Education Committee, August 2015   Incentive Spirometer  An incentive spirometer is a tool that can help keep your lungs clear and active. This tool measures how well you are filling your lungs with each breath. Taking long deep breaths may help reverse or decrease the chance of developing breathing (pulmonary) problems (especially infection) following:  A long period of time when you are unable to move or be active. BEFORE THE PROCEDURE   If the spirometer includes an indicator to show your best effort, your nurse or respiratory therapist will set it to a desired goal.  If possible, sit up straight or lean slightly forward. Try not to slouch.  Hold the incentive spirometer in an upright position. INSTRUCTIONS FOR USE  1. Sit on the edge of your bed if possible, or sit up as far as you can in bed or on a chair. 2. Hold the incentive spirometer in an upright position. 3. Breathe out normally. 4. Place the mouthpiece in your mouth and seal your lips tightly around it. 5. Breathe in slowly and as deeply as possible, raising the piston or the ball toward the top of the column. 6. Hold your breath for 3-5 seconds or for as long as possible. Allow the piston or ball to fall to the bottom of the column. 7. Remove the mouthpiece from your mouth and breathe out normally. 8. Rest for a few seconds and repeat Steps 1 through 7 at least 10 times every 1-2 hours when you are awake. Take your time and take a few normal breaths between deep breaths. 9. The spirometer may include an indicator to show your best effort. Use the indicator as a goal to work toward  during each repetition. 10. After each set of 10 deep breaths, practice coughing to be sure your lungs are clear. If you have an incision (the cut made at the time of surgery), support your incision when coughing by placing a pillow or rolled up towels firmly against it. Once you are able to get out of bed, walk around indoors and cough well. You may stop using the incentive spirometer when instructed by your caregiver.  RISKS AND COMPLICATIONS  Take your time so you do not get dizzy or light-headed.  If you are in pain, you may need to take or ask for pain medication before doing incentive spirometry. It is harder to take a deep breath if you are having pain. AFTER USE  Rest and breathe slowly and easily.  It can be helpful to keep track of a log of your progress. Your caregiver can provide you with a simple table to  help with this. If you are using the spirometer at home, follow these instructions: [place] IF:   You are having difficultly using the spirometer.  You have trouble using the spirometer as often as instructed.  Your pain medication is not giving enough relief while using the spirometer.  You develop fever of 100.5 F (38.1 C) or higher. SEEK IMMEDIATE MEDICAL CARE IF:   You cough up bloody sputum that had not been present before.  You develop fever of 102 F (38.9 C) or greater.  You develop worsening pain at or near the incision site. MAKE SURE YOU:   Understand these instructions.  Will watch your condition.  Will get help right away if you are not doing well or get worse. Document Released: 10/19/2006 Document Revised: 08/31/2011 Document Reviewed: 12/20/2006 Exit[employer] Patient Information 2014 [employer], [place].   ________________________________________________________________________  WHAT IS A BLOOD TRANSFUSION? Blood Transfusion Information  A transfusion is the replacement of blood or some of its parts. Blood is made up of multiple cells  which provide different functions.  Red blood cells carry oxygen and are used for blood loss replacement.  White blood cells fight against infection.  Platelets control bleeding.  Plasma helps clot blood.  Other blood products are available for specialized needs, such as hemophilia or other clotting disorders. BEFORE THE TRANSFUSION  Who gives blood for transfusions?   Healthy volunteers who are fully evaluated to make sure their blood is safe. This is blood bank blood. Transfusion therapy is the safest it has ever been in the practice of medicine. Before blood is taken from a donor, a complete history is taken to make sure that person has no history of diseases nor engages in risky social behavior (examples are intravenous drug use or sexual activity with multiple partners). The donor's travel history is screened to minimize risk of transmitting infections, such as malaria. The donated blood is tested for signs of infectious diseases, such as HIV and hepatitis. The blood is then tested to be sure it is compatible with you in order to minimize the chance of a transfusion reaction. If you or a relative donates blood, this is often done in anticipation of surgery and is not appropriate for emergency situations. It takes many days to process the donated blood. RISKS AND COMPLICATIONS Although transfusion therapy is very safe and saves many lives, the main dangers of transfusion include:   Getting an infectious disease.  Developing a transfusion reaction. This is an allergic reaction to something in the blood you were given. Every precaution is taken to prevent this. The decision to have a blood transfusion has been considered carefully by your caregiver before blood is given. Blood is not given unless the benefits outweigh the risks. AFTER THE TRANSFUSION  Right after receiving a blood transfusion, you will usually feel much better and more energetic. This is especially true if your red blood  cells have gotten low (anemic). The transfusion raises the level of the red blood cells which carry oxygen, and this usually causes an energy increase.  The nurse administering the transfusion will monitor you carefully for complications. HOME CARE INSTRUCTIONS  No special instructions are needed after a transfusion. You may find your energy is better. Speak with your caregiver about any limitations on activity for underlying diseases you may have. SEEK MEDICAL CARE IF:   Your condition is not improving after your transfusion.  You develop redness or irritation at the intravenous (IV) site. SEEK IMMEDIATE MEDICAL CARE IF:  Any of the following symptoms occur over the next 12 hours:  Shaking chills.  You have a temperature by mouth above 102 F (38.9 C), not controlled by medicine.  Chest, back, or muscle pain.  People around you feel you are not acting correctly or are confused.  Shortness of breath or difficulty breathing.  Dizziness and fainting.  You get a rash or develop hives.  You have a decrease in urine output.  Your urine turns a dark color or changes to pink, red, or brown. Any of the following symptoms occur over the next 10 days:  You have a temperature by mouth above 102 F (38.9 C), not controlled by medicine.  Shortness of breath.  Weakness after normal activity.  The white part of the eye turns yellow (jaundice).  You have a decrease in the amount of urine or are urinating less often.  Your urine turns a dark color or changes to pink, red, or brown. Document Released: 06/05/2000 Document Revised: 08/31/2011 Document Reviewed: 01/23/2008 [facility] Patient Information 2014 [place], [place].  _______________________________________________________________________

## 2016-05-05 ENCOUNTER — Encounter (HOSPITAL_COMMUNITY)
Admission: RE | Admit: 2016-05-05 | Discharge: 2016-05-05 | Disposition: A | Discharge status: Home / Self Care | Payer: Medicare Other | Source: Ambulatory Visit | Attending: Orthopedic Surgery | Admitting: Orthopedic Surgery

## 2016-05-05 ENCOUNTER — Ambulatory Visit (HOSPITAL_BASED_OUTPATIENT_CLINIC_OR_DEPARTMENT_OTHER): Payer: Medicare Other | Admitting: Oncology

## 2016-05-05 ENCOUNTER — Other Ambulatory Visit (HOSPITAL_BASED_OUTPATIENT_CLINIC_OR_DEPARTMENT_OTHER): Payer: Medicare Other

## 2016-05-05 ENCOUNTER — Telehealth: Payer: Self-pay | Admitting: Oncology

## 2016-05-05 ENCOUNTER — Encounter (HOSPITAL_COMMUNITY): Payer: Self-pay

## 2016-05-05 VITALS — BP 135/60 | HR 67 | Temp 97.6°F | Resp 21 | Wt 224.1 lb

## 2016-05-05 DIAGNOSIS — C9110 Chronic lymphocytic leukemia of B-cell type not having achieved remission: Secondary | ICD-10-CM | POA: Diagnosis not present

## 2016-05-05 DIAGNOSIS — I10 Essential (primary) hypertension: Secondary | ICD-10-CM | POA: Insufficient documentation

## 2016-05-05 DIAGNOSIS — I495 Sick sinus syndrome: Secondary | ICD-10-CM | POA: Insufficient documentation

## 2016-05-05 DIAGNOSIS — M1612 Unilateral primary osteoarthritis, left hip: Secondary | ICD-10-CM | POA: Diagnosis not present

## 2016-05-05 DIAGNOSIS — E119 Type 2 diabetes mellitus without complications: Secondary | ICD-10-CM | POA: Insufficient documentation

## 2016-05-05 DIAGNOSIS — E785 Hyperlipidemia, unspecified: Secondary | ICD-10-CM | POA: Diagnosis not present

## 2016-05-05 DIAGNOSIS — Z01818 Encounter for other preprocedural examination: Secondary | ICD-10-CM | POA: Insufficient documentation

## 2016-05-05 DIAGNOSIS — R062 Wheezing: Secondary | ICD-10-CM | POA: Diagnosis not present

## 2016-05-05 LAB — COMPREHENSIVE METABOLIC PANEL
ALT: 21 U/L (ref 0–55)
ANION GAP: 10 meq/L (ref 3–11)
AST: 31 U/L (ref 5–34)
Albumin: 3.9 g/dL (ref 3.5–5.0)
Alkaline Phosphatase: 67 U/L (ref 40–150)
BILIRUBIN TOTAL: 0.75 mg/dL (ref 0.20–1.20)
BUN: 18.2 mg/dL (ref 7.0–26.0)
CALCIUM: 9.7 mg/dL (ref 8.4–10.4)
CO2: 22 mEq/L (ref 22–29)
CREATININE: 1.3 mg/dL (ref 0.7–1.3)
Chloride: 110 mEq/L — ABNORMAL HIGH (ref 98–109)
EGFR: 53 mL/min/{1.73_m2} — AB (ref >90)
Glucose: 137 mg/dl (ref 70–140)
Potassium: 5.5 mEq/L — ABNORMAL HIGH (ref 3.5–5.1)
Sodium: 141 mEq/L (ref 136–145)
TOTAL PROTEIN: 7.1 g/dL (ref 6.4–8.3)

## 2016-05-05 LAB — GLUCOSE, CAPILLARY: GLUCOSE-CAPILLARY: 79 mg/dL (ref 65–99)

## 2016-05-05 LAB — CBC WITH DIFFERENTIAL/PLATELET
BASO%: 0.2 % (ref 0.0–2.0)
Basophils Absolute: 0.1 10*3/uL (ref 0.0–0.1)
EOS ABS: 1 10*3/uL — AB (ref 0.0–0.5)
EOS%: 1.8 % (ref 0.0–7.0)
HEMATOCRIT: 43.2 % (ref 38.4–49.9)
HGB: 13.8 g/dL (ref 13.0–17.1)
LYMPH#: 51.3 10*3/uL — AB (ref 0.9–3.3)
LYMPH%: 88.3 % — AB (ref 14.0–49.0)
MCH: 33.4 pg (ref 27.2–33.4)
MCHC: 32.1 g/dL (ref 32.0–36.0)
MCV: 104.3 fL — AB (ref 79.3–98.0)
MONO#: 0.2 10*3/uL (ref 0.1–0.9)
MONO%: 0.4 % (ref 0.0–14.0)
NEUT%: 9.3 % — AB (ref 39.0–75.0)
NEUTROS ABS: 5.4 10*3/uL (ref 1.5–6.5)
PLATELETS: 262 10*3/uL (ref 140–400)
RBC: 4.14 10*6/uL — ABNORMAL LOW (ref 4.20–5.82)
RDW: 20.1 % — ABNORMAL HIGH (ref 11.0–14.6)
WBC: 58.1 10*3/uL (ref 4.0–10.3)

## 2016-05-05 LAB — SURGICAL PCR SCREEN
MRSA, PCR: NEGATIVE
Staphylococcus aureus: NEGATIVE

## 2016-05-05 LAB — LACTATE DEHYDROGENASE: LDH: 170 U/L (ref 125–245)

## 2016-05-05 LAB — TECHNOLOGIST REVIEW

## 2016-05-05 NOTE — Progress Notes (Signed)
Hematology and Oncology Follow Up Visit  [name] [id number] [birth year] 82 y.o. 05/05/2016 8:43 AM [name], [place], [name], MD   Principle Diagnosis: 82 year old gentleman with CLL diagnosed in August 2015. He presented with leukocytosis and mild lymphadenopathy.  Current therapy: Observation and surveillance:  Interim History:  [name] presents today for a follow-up visit. Since the last visit, he reports slight increase in his left hip pain. He is scheduled to have hip surgery in the next week or so. Despite his pain, he still ambulating without any major difficulties. He denied any falls or syncope. He denied any pathological fractures. He still able to drive and attend to her activities of daily living.   He does not report any constitutional symptoms of fevers or chills or sweats. He does not report any painful adenopathy or change in his performance status. He reports that his appetite have not changed and feels relatively that his taste is about the same.   He does not report any headaches or blurry vision or syncope. He does not report any chest pain, palpitation or shortness of breath. He does not report any nausea, vomiting, abdominal pain. He does not report any frequency urgency or hesitancy. He does not report any skeletal complaints. Rest of his review of systems unremarkable.  Medications: I have reviewed the patient's current medications.  Current Outpatient Prescriptions  Medication Sig Dispense Refill  . amLODipine (NORVASC) 10 MG tablet Take 10 mg by mouth at bedtime.   2  . atorvastatin (LIPITOR) 20 MG tablet Take 1 tablet (20 mg total) by mouth at bedtime. 90 tablet 3  . Docusate Calcium (STOOL SOFTENER PO) Take 1-2 tablets by mouth at bedtime as needed (constipation). Depends on how constipated if takes 1 or 2 tablets    . fenofibrate micronized (LOFIBRA) 200 MG capsule Take 1 capsule (200 mg total) by mouth daily before breakfast. 90 capsule 3  . GLIPIZIDE  XL 2.5 MG 24 hr tablet Take 2.5 mg by mouth daily with breakfast.   2  . metFORMIN (GLUCOPHAGE) 1000 MG tablet Take 1,000 mg by mouth 2 (two) times daily with a meal.      . sitaGLIPtan (JANUVIA) 100 MG tablet Take 100 mg by mouth at bedtime.     [name] aspirin 81 MG tablet Take 81 mg by mouth at bedtime. Will stop prior to surgery    . multivitamin (THERAGRAN) per tablet Take 1 tablet by mouth daily. Will stop prior to procedure    . vitamin B-12 (CYANOCOBALAMIN) 1000 MCG tablet Take 1,000 mcg by mouth daily. Will stop prior to procedure    . warfarin (COUMADIN) 5 MG tablet Take as directed by coumadin clinic (Patient not taking: Reported on 05/05/2016) 90 tablet 0   No current facility-administered medications for this visit.      Allergies:  Allergies  Allergen Reactions  . Altace [Ramipril] Other (See Comments)    "throat felt like had a knot in it"  . Codeine Nausea And Vomiting    Nausea and vomiting   . Simvastatin Other (See Comments)    Leg aches    Past Medical History, Surgical history, Social history, and Family History were reviewed and updated.   Physical Exam: Blood pressure 135/60, pulse 67, temperature 97.6 F (36.4 C), temperature source Oral, resp. rate (!) 21, weight 224 lb 1.6 oz (101.7 kg), SpO2 99 %. ECOG: 0 General appearance: Alert, awake gentleman without distress. Head: Normocephalic, without obvious abnormality no oral thrush. Neck: no  adenopathy no thyroid masses. Lymph nodes: Cervical, supraclavicular, and axillary nodes normal. Heart:regular rate and rhythm, S1, S2 normal, no murmur, click, rub or gallop Lung:chest clear, no wheezing, rales, normal symmetric air entry.  Abdomin: soft, non-tender, without masses or organomegaly no shifting dullness or ascites. EXT:no erythema, induration, or nodules   Lab Results: Lab Results  Component Value Date   WBC 58.1 (HH) 05/05/2016   HGB 13.8 05/05/2016   HCT 43.2 05/05/2016   MCV 104.3 (H) 05/05/2016    PLT 262 05/05/2016     Chemistry      Component Value Date/Time   NA 141 10/29/2015 0810   K 4.5 10/29/2015 0810   CL 105 05/28/2015 1059   CO2 24 10/29/2015 0810   BUN 20.9 10/29/2015 0810   CREATININE 1.2 10/29/2015 0810      Component Value Date/Time   CALCIUM 9.2 10/29/2015 0810   ALKPHOS 53 10/29/2015 0810   AST 30 10/29/2015 0810   ALT 27 10/29/2015 0810   BILITOT 0.62 10/29/2015 0810      Impression and Plan:  82 year old gentleman with: 1. Lymphocytosis: The clinical findings are consistent with CLL. His CT scan results on 04/17/2014 showed very small lymphadenopathy and no indication for treatment.   His laboratory data, physical examination and clinical presentation has not changed dramatically in the last 6 months. I continue to see no indication for treatment at this time and indications for treatment were reviewed today. He does not have any constitutional symptoms or rapidly changing clinical status. I recommended continued observation and surveillance.  2. Hip pain: Scheduled for surgery next week. There is no objection from hematology standpoint.  3. Follow-up: Will be in 6 months.    [facility], MD 11/14/20178:43 AM

## 2016-05-05 NOTE — Telephone Encounter (Signed)
Patient refused copy of AVS report and appointment schedule. Appointments scheduled per 05/05/16 los.

## 2016-05-05 NOTE — Pre-Procedure Instructions (Signed)
EKG 12'16, Echo 7'15, CXR 7'16 Epic. Labs done 05-05-16 [facility] requested -CBC/d, CMP, LDH-with  Chart -Dr. Alen Blew.

## 2016-05-06 LAB — HEMOGLOBIN A1C
HEMOGLOBIN A1C: 6 % — AB (ref 4.8–5.6)
MEAN PLASMA GLUCOSE: 126 mg/dL

## 2016-05-12 ENCOUNTER — Encounter (HOSPITAL_COMMUNITY): Payer: Self-pay

## 2016-05-12 ENCOUNTER — Inpatient Hospital Stay (HOSPITAL_COMMUNITY)
Admission: RE | Admit: 2016-05-12 | Discharge: 2016-05-13 | DRG: 470 | Disposition: A | Discharge status: Home / Self Care | Payer: Medicare Other | Source: Ambulatory Visit | Attending: Orthopedic Surgery | Admitting: Orthopedic Surgery

## 2016-05-12 ENCOUNTER — Inpatient Hospital Stay (HOSPITAL_COMMUNITY): Payer: Medicare Other

## 2016-05-12 ENCOUNTER — Encounter (HOSPITAL_COMMUNITY)
Admission: RE | Disposition: A | Discharge status: Home / Self Care | Payer: Self-pay | Source: Ambulatory Visit | Attending: Orthopedic Surgery

## 2016-05-12 ENCOUNTER — Inpatient Hospital Stay (HOSPITAL_COMMUNITY): Payer: Medicare Other | Admitting: Anesthesiology

## 2016-05-12 DIAGNOSIS — Z8546 Personal history of malignant neoplasm of prostate: Secondary | ICD-10-CM | POA: Diagnosis not present

## 2016-05-12 DIAGNOSIS — I10 Essential (primary) hypertension: Secondary | ICD-10-CM | POA: Diagnosis not present

## 2016-05-12 DIAGNOSIS — Z96642 Presence of left artificial hip joint: Secondary | ICD-10-CM | POA: Diagnosis not present

## 2016-05-12 DIAGNOSIS — Z950 Presence of cardiac pacemaker: Secondary | ICD-10-CM

## 2016-05-12 DIAGNOSIS — E785 Hyperlipidemia, unspecified: Secondary | ICD-10-CM | POA: Diagnosis not present

## 2016-05-12 DIAGNOSIS — Z87891 Personal history of nicotine dependence: Secondary | ICD-10-CM | POA: Diagnosis not present

## 2016-05-12 DIAGNOSIS — Z471 Aftercare following joint replacement surgery: Secondary | ICD-10-CM | POA: Diagnosis not present

## 2016-05-12 DIAGNOSIS — Z888 Allergy status to other drugs, medicaments and biological substances status: Secondary | ICD-10-CM | POA: Diagnosis not present

## 2016-05-12 DIAGNOSIS — Z856 Personal history of leukemia: Secondary | ICD-10-CM | POA: Diagnosis not present

## 2016-05-12 DIAGNOSIS — Z96649 Presence of unspecified artificial hip joint: Secondary | ICD-10-CM

## 2016-05-12 DIAGNOSIS — E119 Type 2 diabetes mellitus without complications: Secondary | ICD-10-CM | POA: Diagnosis not present

## 2016-05-12 DIAGNOSIS — Z8249 Family history of ischemic heart disease and other diseases of the circulatory system: Secondary | ICD-10-CM | POA: Diagnosis not present

## 2016-05-12 DIAGNOSIS — Z885 Allergy status to narcotic agent status: Secondary | ICD-10-CM | POA: Diagnosis not present

## 2016-05-12 DIAGNOSIS — E669 Obesity, unspecified: Secondary | ICD-10-CM | POA: Diagnosis present

## 2016-05-12 DIAGNOSIS — M1612 Unilateral primary osteoarthritis, left hip: Principal | ICD-10-CM | POA: Diagnosis present

## 2016-05-12 DIAGNOSIS — Z6832 Body mass index (BMI) 32.0-32.9, adult: Secondary | ICD-10-CM | POA: Diagnosis not present

## 2016-05-12 DIAGNOSIS — I4891 Unspecified atrial fibrillation: Secondary | ICD-10-CM | POA: Diagnosis not present

## 2016-05-12 DIAGNOSIS — Z9889 Other specified postprocedural states: Secondary | ICD-10-CM | POA: Insufficient documentation

## 2016-05-12 HISTORY — PX: TOTAL HIP ARTHROPLASTY: SHX124

## 2016-05-12 LAB — PROTIME-INR
INR: 1.14
PROTHROMBIN TIME: 14.7 s (ref 11.4–15.2)

## 2016-05-12 LAB — GLUCOSE, CAPILLARY
GLUCOSE-CAPILLARY: 111 mg/dL — AB (ref 65–99)
GLUCOSE-CAPILLARY: 241 mg/dL — AB (ref 65–99)
Glucose-Capillary: 137 mg/dL — ABNORMAL HIGH (ref 65–99)
Glucose-Capillary: 234 mg/dL — ABNORMAL HIGH (ref 65–99)

## 2016-05-12 LAB — CBC
HEMATOCRIT: 35.3 % — AB (ref 39.0–52.0)
HEMOGLOBIN: 11.9 g/dL — AB (ref 13.0–17.0)
MCH: 33.7 pg (ref 26.0–34.0)
MCHC: 33.7 g/dL (ref 30.0–36.0)
MCV: 100 fL (ref 78.0–100.0)
Platelets: 243 10*3/uL (ref 150–400)
RBC: 3.53 MIL/uL — ABNORMAL LOW (ref 4.22–5.81)
RDW: 19.2 % — AB (ref 11.5–15.5)
WBC: 60.7 10*3/uL (ref 4.0–10.5)

## 2016-05-12 LAB — CREATININE, SERUM
CREATININE: 0.96 mg/dL (ref 0.61–1.24)
GFR calc Af Amer: >60 mL/min (ref >60)

## 2016-05-12 LAB — TYPE AND SCREEN
ABO/RH(D): O POS
Antibody Screen: NEGATIVE

## 2016-05-12 LAB — APTT: APTT: 26 s (ref 24–36)

## 2016-05-12 SURGERY — ARTHROPLASTY, HIP, TOTAL, ANTERIOR APPROACH
Anesthesia: Spinal | Site: Hip | Laterality: Left

## 2016-05-12 MED ORDER — PROPOFOL 10 MG/ML IV BOLUS
INTRAVENOUS | Status: AC
  Filled 2016-05-12: qty 20

## 2016-05-12 MED ORDER — ONDANSETRON HCL 4 MG/2ML IJ SOLN
INTRAMUSCULAR | Status: AC
  Filled 2016-05-12: qty 2

## 2016-05-12 MED ORDER — PHENOL 1.4 % MT LIQD
1.0000 | OROMUCOSAL | Status: DC | PRN
  Filled 2016-05-12: qty 177

## 2016-05-12 MED ORDER — BUPIVACAINE HCL (PF) 0.5 % IJ SOLN
INTRAMUSCULAR | Status: AC
  Filled 2016-05-12: qty 90

## 2016-05-12 MED ORDER — LINAGLIPTIN 5 MG PO TABS
5.0000 mg | ORAL_TABLET | Freq: Every day | ORAL | Status: DC
  Administered 2016-05-12: 5 mg via ORAL
  Filled 2016-05-12: qty 1

## 2016-05-12 MED ORDER — DEXTROSE 5 % IV SOLN
500.0000 mg | Freq: Four times a day (QID) | INTRAVENOUS | Status: DC | PRN
  Administered 2016-05-12: 500 mg via INTRAVENOUS
  Filled 2016-05-12: qty 550
  Filled 2016-05-12: qty 5

## 2016-05-12 MED ORDER — SODIUM CHLORIDE 0.9 % IV SOLN
INTRAVENOUS | Status: DC
  Administered 2016-05-12: 16:00:00 via INTRAVENOUS

## 2016-05-12 MED ORDER — PROPOFOL 500 MG/50ML IV EMUL
INTRAVENOUS | Status: DC | PRN
  Administered 2016-05-12: 125 ug/kg/min via INTRAVENOUS
  Administered 2016-05-12: 75 ug/kg/min via INTRAVENOUS

## 2016-05-12 MED ORDER — DEXAMETHASONE SODIUM PHOSPHATE 10 MG/ML IJ SOLN
10.0000 mg | Freq: Once | INTRAMUSCULAR | Status: AC
  Administered 2016-05-13: 10 mg via INTRAVENOUS
  Filled 2016-05-12: qty 1

## 2016-05-12 MED ORDER — MIDAZOLAM HCL 2 MG/2ML IJ SOLN
INTRAMUSCULAR | Status: AC
  Filled 2016-05-12: qty 2

## 2016-05-12 MED ORDER — BISACODYL 10 MG RE SUPP: 10.0000 mg | Freq: Every day | RECTAL | Status: DC | PRN

## 2016-05-12 MED ORDER — DIPHENHYDRAMINE HCL 25 MG PO CAPS: 25.0000 mg | ORAL_CAPSULE | Freq: Four times a day (QID) | ORAL | Status: DC | PRN

## 2016-05-12 MED ORDER — MEPERIDINE HCL 50 MG/ML IJ SOLN: 6.2500 mg | INTRAMUSCULAR | Status: DC | PRN

## 2016-05-12 MED ORDER — POLYETHYLENE GLYCOL 3350 17 G PO PACK
17.0000 g | PACK | Freq: Two times a day (BID) | ORAL | Status: DC
  Administered 2016-05-12 – 2016-05-13 (×2): 17 g via ORAL
  Filled 2016-05-12 (×2): qty 1

## 2016-05-12 MED ORDER — ONDANSETRON HCL 4 MG/2ML IJ SOLN: 4.0000 mg | Freq: Once | INTRAMUSCULAR | Status: DC | PRN

## 2016-05-12 MED ORDER — BUPIVACAINE HCL (PF) 0.5 % IJ SOLN
INTRAMUSCULAR | Status: AC
  Filled 2016-05-12: qty 30

## 2016-05-12 MED ORDER — FENTANYL CITRATE (PF) 100 MCG/2ML IJ SOLN
INTRAMUSCULAR | Status: AC
  Filled 2016-05-12: qty 2

## 2016-05-12 MED ORDER — FENOFIBRATE 160 MG PO TABS
160.0000 mg | ORAL_TABLET | Freq: Every day | ORAL | Status: DC
  Administered 2016-05-13: 160 mg via ORAL
  Filled 2016-05-12: qty 1

## 2016-05-12 MED ORDER — LACTATED RINGERS IV SOLN
INTRAVENOUS | Status: DC
  Administered 2016-05-12 (×3): via INTRAVENOUS

## 2016-05-12 MED ORDER — PHENYLEPHRINE HCL 10 MG/ML IJ SOLN
INTRAMUSCULAR | Status: DC | PRN
Start: 2016-05-12 — End: 2016-05-12
  Administered 2016-05-12: 80 ug via INTRAVENOUS

## 2016-05-12 MED ORDER — ALUM & MAG HYDROXIDE-SIMETH 200-200-20 MG/5ML PO SUSP: 30.0000 mL | ORAL | Status: DC | PRN

## 2016-05-12 MED ORDER — HYDROMORPHONE HCL 1 MG/ML IJ SOLN: 0.5000 mg | INTRAMUSCULAR | Status: DC | PRN

## 2016-05-12 MED ORDER — MENTHOL 3 MG MT LOZG: 1.0000 | LOZENGE | OROMUCOSAL | Status: DC | PRN

## 2016-05-12 MED ORDER — ONDANSETRON HCL 4 MG PO TABS: 4.0000 mg | ORAL_TABLET | Freq: Four times a day (QID) | ORAL | Status: DC | PRN

## 2016-05-12 MED ORDER — PROPOFOL 10 MG/ML IV BOLUS
INTRAVENOUS | Status: AC
  Filled 2016-05-12: qty 60

## 2016-05-12 MED ORDER — FERROUS SULFATE 325 (65 FE) MG PO TABS
325.0000 mg | ORAL_TABLET | Freq: Three times a day (TID) | ORAL | Status: DC
  Administered 2016-05-13: 325 mg via ORAL
  Filled 2016-05-12: qty 1

## 2016-05-12 MED ORDER — METOCLOPRAMIDE HCL 5 MG PO TABS: 5.0000 mg | ORAL_TABLET | Freq: Three times a day (TID) | ORAL | Status: DC | PRN

## 2016-05-12 MED ORDER — DEXAMETHASONE SODIUM PHOSPHATE 10 MG/ML IJ SOLN
INTRAMUSCULAR | Status: AC
  Filled 2016-05-12: qty 1

## 2016-05-12 MED ORDER — 0.9 % SODIUM CHLORIDE (POUR BTL) OPTIME
TOPICAL | Status: DC | PRN
  Administered 2016-05-12: 1000 mL

## 2016-05-12 MED ORDER — WARFARIN - PHARMACIST DOSING INPATIENT: Freq: Every day | Status: DC

## 2016-05-12 MED ORDER — ENOXAPARIN SODIUM 40 MG/0.4ML ~~LOC~~ SOLN
40.0000 mg | SUBCUTANEOUS | Status: DC
  Administered 2016-05-13: 40 mg via SUBCUTANEOUS
  Filled 2016-05-12: qty 0.4

## 2016-05-12 MED ORDER — PROPOFOL 10 MG/ML IV BOLUS
INTRAVENOUS | Status: AC
  Filled 2016-05-12: qty 40

## 2016-05-12 MED ORDER — GLIPIZIDE ER 2.5 MG PO TB24
2.5000 mg | ORAL_TABLET | Freq: Every day | ORAL | Status: DC
  Administered 2016-05-13: 2.5 mg via ORAL
  Filled 2016-05-12: qty 1

## 2016-05-12 MED ORDER — METFORMIN HCL 500 MG PO TABS
1000.0000 mg | ORAL_TABLET | Freq: Two times a day (BID) | ORAL | Status: DC
  Administered 2016-05-12 – 2016-05-13 (×2): 1000 mg via ORAL
  Filled 2016-05-12 (×2): qty 2

## 2016-05-12 MED ORDER — CHLORHEXIDINE GLUCONATE 4 % EX LIQD: 60.0000 mL | Freq: Once | CUTANEOUS | Status: DC

## 2016-05-12 MED ORDER — WARFARIN SODIUM 2.5 MG PO TABS
7.5000 mg | ORAL_TABLET | Freq: Once | ORAL | Status: AC
Start: 2016-05-12 — End: 2016-05-12
  Administered 2016-05-12: 7.5 mg via ORAL
  Filled 2016-05-12: qty 1

## 2016-05-12 MED ORDER — METOCLOPRAMIDE HCL 5 MG/ML IJ SOLN: 5.0000 mg | Freq: Three times a day (TID) | INTRAMUSCULAR | Status: DC | PRN

## 2016-05-12 MED ORDER — STERILE WATER FOR IRRIGATION IR SOLN
Status: DC | PRN
  Administered 2016-05-12: 2000 mL

## 2016-05-12 MED ORDER — HYDROCODONE-ACETAMINOPHEN 7.5-325 MG PO TABS
1.0000 | ORAL_TABLET | ORAL | Status: DC
  Administered 2016-05-12: 1 via ORAL
  Administered 2016-05-12: 2 via ORAL
  Administered 2016-05-12: 1 via ORAL
  Administered 2016-05-13 (×2): 2 via ORAL
  Administered 2016-05-13: 1 via ORAL
  Filled 2016-05-12: qty 2
  Filled 2016-05-12 (×2): qty 1
  Filled 2016-05-12 (×3): qty 2

## 2016-05-12 MED ORDER — FENTANYL CITRATE (PF) 100 MCG/2ML IJ SOLN
25.0000 ug | INTRAMUSCULAR | Status: DC | PRN
  Administered 2016-05-12: 25 ug via INTRAVENOUS
  Administered 2016-05-12: 50 ug via INTRAVENOUS

## 2016-05-12 MED ORDER — TRANEXAMIC ACID 1000 MG/10ML IV SOLN
1000.0000 mg | INTRAVENOUS | Status: AC
  Administered 2016-05-12: 1000 mg via INTRAVENOUS
  Filled 2016-05-12: qty 1100

## 2016-05-12 MED ORDER — CEFAZOLIN SODIUM-DEXTROSE 2-4 GM/100ML-% IV SOLN
2.0000 g | Freq: Four times a day (QID) | INTRAVENOUS | Status: AC
  Administered 2016-05-12 – 2016-05-13 (×2): 2 g via INTRAVENOUS
  Filled 2016-05-12 (×2): qty 100

## 2016-05-12 MED ORDER — DEXAMETHASONE SODIUM PHOSPHATE 10 MG/ML IJ SOLN
10.0000 mg | Freq: Once | INTRAMUSCULAR | Status: AC
  Administered 2016-05-12: 10 mg via INTRAVENOUS

## 2016-05-12 MED ORDER — ONDANSETRON HCL 4 MG/2ML IJ SOLN
INTRAMUSCULAR | Status: DC | PRN
  Administered 2016-05-12: 4 mg via INTRAVENOUS

## 2016-05-12 MED ORDER — METHOCARBAMOL 500 MG PO TABS: 500.0000 mg | ORAL_TABLET | Freq: Four times a day (QID) | ORAL | Status: DC | PRN

## 2016-05-12 MED ORDER — DOCUSATE SODIUM 100 MG PO CAPS
100.0000 mg | ORAL_CAPSULE | Freq: Two times a day (BID) | ORAL | Status: DC
  Administered 2016-05-12 – 2016-05-13 (×2): 100 mg via ORAL
  Filled 2016-05-12 (×2): qty 1

## 2016-05-12 MED ORDER — ATORVASTATIN CALCIUM 20 MG PO TABS
20.0000 mg | ORAL_TABLET | Freq: Every day | ORAL | Status: DC
  Administered 2016-05-12: 20 mg via ORAL
  Filled 2016-05-12: qty 1

## 2016-05-12 MED ORDER — MAGNESIUM CITRATE PO SOLN: 1.0000 | Freq: Once | ORAL | Status: DC | PRN

## 2016-05-12 MED ORDER — AMLODIPINE BESYLATE 10 MG PO TABS
10.0000 mg | ORAL_TABLET | Freq: Every day | ORAL | Status: DC
  Administered 2016-05-12: 10 mg via ORAL
  Filled 2016-05-12: qty 1

## 2016-05-12 MED ORDER — CELECOXIB 200 MG PO CAPS
200.0000 mg | ORAL_CAPSULE | Freq: Two times a day (BID) | ORAL | Status: DC
  Administered 2016-05-12 – 2016-05-13 (×2): 200 mg via ORAL
  Filled 2016-05-12 (×2): qty 1

## 2016-05-12 MED ORDER — FENTANYL CITRATE (PF) 100 MCG/2ML IJ SOLN
INTRAMUSCULAR | Status: DC | PRN
  Administered 2016-05-12 (×2): 50 ug via INTRAVENOUS

## 2016-05-12 MED ORDER — CEFAZOLIN SODIUM-DEXTROSE 2-4 GM/100ML-% IV SOLN
2.0000 g | INTRAVENOUS | Status: AC
  Administered 2016-05-12: 2 g via INTRAVENOUS
  Filled 2016-05-12: qty 100

## 2016-05-12 MED ORDER — ONDANSETRON HCL 4 MG/2ML IJ SOLN
4.0000 mg | Freq: Four times a day (QID) | INTRAMUSCULAR | Status: DC | PRN
  Administered 2016-05-13: 4 mg via INTRAVENOUS
  Filled 2016-05-12: qty 2

## 2016-05-12 MED ORDER — CEFAZOLIN SODIUM-DEXTROSE 2-4 GM/100ML-% IV SOLN
INTRAVENOUS | Status: AC
  Filled 2016-05-12: qty 100

## 2016-05-12 MED ORDER — INSULIN ASPART 100 UNIT/ML ~~LOC~~ SOLN
0.0000 [IU] | Freq: Three times a day (TID) | SUBCUTANEOUS | Status: DC
  Administered 2016-05-12 – 2016-05-13 (×3): 5 [IU] via SUBCUTANEOUS

## 2016-05-12 MED ORDER — BUPIVACAINE HCL (PF) 0.5 % IJ SOLN
INTRAMUSCULAR | Status: DC | PRN
  Administered 2016-05-12: 3 mL via PERINEURAL

## 2016-05-12 SURGICAL SUPPLY — 38 items
ADH SKN CLS APL DERMABOND .7 (GAUZE/BANDAGES/DRESSINGS) ×1
BAG SPEC THK2 15X12 ZIP CLS (MISCELLANEOUS) ×1
BAG ZIPLOCK 12X15 (MISCELLANEOUS) ×2 IMPLANT
BLADE SAW SGTL 18X1.27X75 (BLADE) ×2 IMPLANT
BLADE SAW SGTL 18X1.27X75MM (BLADE) ×1
CAPT HIP TOTAL 2 ×2 IMPLANT
CLOTH BEACON ORANGE TIMEOUT ST (SAFETY) ×3 IMPLANT
COVER PERINEAL POST (MISCELLANEOUS) ×3 IMPLANT
DERMABOND ADVANCED (GAUZE/BANDAGES/DRESSINGS) ×2
DERMABOND ADVANCED .7 DNX12 (GAUZE/BANDAGES/DRESSINGS) ×1 IMPLANT
DRAPE STERI IOBAN 125X83 (DRAPES) ×3 IMPLANT
DRAPE U-SHAPE 47X51 STRL (DRAPES) ×6 IMPLANT
DRESSING AQUACEL AG SP 3.5X10 (GAUZE/BANDAGES/DRESSINGS) ×1 IMPLANT
DRSG AQUACEL AG SP 3.5X10 (GAUZE/BANDAGES/DRESSINGS) ×3
DURAPREP 26ML APPLICATOR (WOUND CARE) ×3 IMPLANT
ELECT REM PT RETURN 9FT ADLT (ELECTROSURGICAL) ×3
ELECTRODE REM PT RTRN 9FT ADLT (ELECTROSURGICAL) ×1 IMPLANT
GLOVE BIOGEL M STRL SZ7.5 (GLOVE) IMPLANT
GLOVE BIOGEL PI IND STRL 7.5 (GLOVE) ×1 IMPLANT
GLOVE BIOGEL PI IND STRL 8.5 (GLOVE) ×1 IMPLANT
GLOVE BIOGEL PI INDICATOR 7.5 (GLOVE) ×10
GLOVE BIOGEL PI INDICATOR 8.5 (GLOVE) ×2
GLOVE ECLIPSE 8.0 STRL XLNG CF (GLOVE) ×6 IMPLANT
GLOVE ORTHO TXT STRL SZ7.5 (GLOVE) ×3 IMPLANT
GLOVE SURG SS PI 7.5 STRL IVOR (GLOVE) ×4 IMPLANT
GOWN STRL REUS W/ TWL XL LVL3 (GOWN DISPOSABLE) IMPLANT
GOWN STRL REUS W/TWL LRG LVL3 (GOWN DISPOSABLE) ×3 IMPLANT
GOWN STRL REUS W/TWL XL LVL3 (GOWN DISPOSABLE) ×8 IMPLANT
HOLDER FOLEY CATH W/STRAP (MISCELLANEOUS) ×3 IMPLANT
PACK ANTERIOR HIP CUSTOM (KITS) ×3 IMPLANT
SUT MNCRL AB 4-0 PS2 18 (SUTURE) ×3 IMPLANT
SUT VIC AB 1 CT1 36 (SUTURE) ×9 IMPLANT
SUT VIC AB 2-0 CT1 27 (SUTURE) ×6
SUT VIC AB 2-0 CT1 TAPERPNT 27 (SUTURE) ×2 IMPLANT
SUT VLOC 180 0 24IN GS25 (SUTURE) ×3 IMPLANT
TRAY FOLEY CATH SILVER 14FR (SET/KITS/TRAYS/PACK) ×2 IMPLANT
TRAY FOLEY W/METER SILVER 16FR (SET/KITS/TRAYS/PACK) IMPLANT
YANKAUER SUCT BULB TIP 10FT TU (MISCELLANEOUS) ×2 IMPLANT

## 2016-05-12 NOTE — Op Note (Signed)
NAME:  Oscar Baker NO.: [id number]      MEDICAL RECORD NO.: [id number]      FACILITY:  [facility]      PHYSICIAN:  Paralee Cancel D  DATE OF BIRTH:  1932/12/26     DATE OF PROCEDURE:  05/12/2016                                 OPERATIVE REPORT         PREOPERATIVE DIAGNOSIS: Left  hip osteoarthritis.      POSTOPERATIVE DIAGNOSIS:  Left hip osteoarthritis.      PROCEDURE:  Left total hip replacement through an anterior approach   utilizing DePuy THR system, component size 25mm pinnacle cup, a size 36+4 neutral   Altrex liner, a size 10 Hi Tri Lock stem with a 36+1.5 delta ceramic   ball.      SURGEON:  Pietro Cassis. Alvan Dame, M.D.      ASSISTANT:  Nehemiah Massed, PA-C     ANESTHESIA:  Spinal.      SPECIMENS:  None.      COMPLICATIONS:  None.      BLOOD LOSS:  350 cc     DRAINS:  None.      INDICATION OF THE PROCEDURE:  [name] is a 82 y.o. male who had   presented to office for evaluation of left hip pain.  Radiographs revealed   progressive degenerative changes with bone-on-bone   articulation to the  hip joint.  The patient had painful limited range of   motion significantly affecting their overall quality of life.  The patient was failing to    respond to conservative measures, and at this point was ready   to proceed with more definitive measures.  The patient has noted progressive   degenerative changes in his hip, progressive problems and dysfunction   with regarding the hip prior to surgery.  Consent was obtained for   benefit of pain relief.  Specific risk of infection, DVT, component   failure, dislocation, need for revision surgery, as well discussion of   the anterior versus posterior approach were reviewed.  Consent was   obtained for benefit of anterior pain relief through an anterior   approach.      PROCEDURE IN DETAIL:  The patient was brought to operative theater.   Once adequate anesthesia, preoperative  antibiotics, 2gm of Ancef, 1 gm Tranexamic Acid, and 10 mg of Decadron administered.   The patient was positioned supine on the OSI Hanna table.  Once adequate   padding of boney process was carried out, we had predraped out the hip, and  used fluoroscopy to confirm orientation of the pelvis and position.      The left hip was then prepped and draped from proximal iliac crest to   mid thigh with shower curtain technique.      Time-out was performed identifying the patient, planned procedure, and   extremity.     An incision was then made 2 cm distal and lateral to the   anterior superior iliac spine extending over the orientation of the   tensor fascia lata muscle and sharp dissection was carried down to the   fascia of the muscle and protractor placed in the soft tissues.      The fascia was then  incised.  The muscle belly was identified and swept   laterally and retractor placed along the superior neck.  Following   cauterization of the circumflex vessels and removing some pericapsular   fat, a second cobra retractor was placed on the inferior neck.  A third   retractor was placed on the anterior acetabulum after elevating the   anterior rectus.  A L-capsulotomy was along the line of the   superior neck to the trochanteric fossa, then extended proximally and   distally.  Tag sutures were placed and the retractors were then placed   intracapsular.  We then identified the trochanteric fossa and   orientation of my neck cut, confirmed this radiographically   and then made a neck osteotomy with the femur on traction.  The femoral   head was removed without difficulty or complication.  Traction was let   off and retractors were placed posterior and anterior around the   acetabulum.      The labrum and foveal tissue were debrided.  I began reaming with a 67mm   reamer and reamed up to 73mm reamer with good bony bed preparation and a 61mm   cup was chosen.  The final 12mm Pinnacle cup was  then impacted under fluoroscopy  to confirm the depth of penetration and orientation with respect to   abduction.  A screw was placed followed by the hole eliminator.  The final   36+4 neutral Altrex liner was impacted with good visualized rim fit.  The cup was positioned anatomically within the acetabular portion of the pelvis.      At this point, the femur was rolled at 80 degrees.  Further capsule was   released off the inferior aspect of the femoral neck.  I then   released the superior capsule proximally.  The hook was placed laterally   along the femur and elevated manually and held in position with the bed   hook.  The leg was then extended and adducted with the leg rolled to 100   degrees of external rotation.  Once the proximal femur was fully   exposed, I used a box osteotome to set orientation.  I then began   broaching with the starting chili pepper broach and passed this by hand and then broached up to 10.  With the 10 broach in place I chose a high offset neck and did several trial reductions.  The offset was appropriate, leg lengths   appeared to be equal best matched with the +1.5 head ball confirmed radiographically.   Given these findings, I went ahead and dislocated the hip, repositioned all   retractors and positioned the right hip in the extended and abducted position.  The final 10 Hi Tri Lock stem was   chosen and it was impacted down to the level of neck cut.  Based on this   and the trial reduction, a 36+1.5 delta ceramic ball was chosen and   impacted onto a clean and dry trunnion, and the hip was reduced.  The   hip had been irrigated throughout the case again at this point.  I did   reapproximate the superior capsular leaflet to the anterior leaflet   using #1 Vicryl.  The fascia of the   tensor fascia lata muscle was then reapproximated using #1 Vicryl and #0 V-lock sutures.  The   remaining wound was closed with 2-0 Vicryl and running 4-0 Monocryl.   The hip was  cleaned, dried, and dressed  sterilely using Dermabond and   Aquacel dressing.  He was then brought   to recovery room in stable condition tolerating the procedure well.    Nehemiah Massed, PA-C was present for the entirety of the case involved from   preoperative positioning, perioperative retractor management, general   facilitation of the case, as well as primary wound closure as assistant.            Pietro Cassis Alvan Dame, M.D.        05/12/2016 1:01 PM

## 2016-05-12 NOTE — Anesthesia Preprocedure Evaluation (Addendum)
Anesthesia Evaluation  Patient identified by MRN, date of birth, ID band Patient awake    Reviewed: Allergy & Precautions, H&P , Patient's Chart, lab work & pertinent test results  Airway Mallampati: II  TM Distance: >3 FB Neck ROM: full    Dental no notable dental hx.    Pulmonary former smoker,    Pulmonary exam normal breath sounds clear to auscultation       Cardiovascular Exercise Tolerance: Good hypertension,  Rhythm:regular Rate:Normal     Neuro/Psych    GI/Hepatic   Endo/Other  diabetes  Renal/GU      Musculoskeletal   Abdominal   Peds  Hematology   Anesthesia Other Findings PT 1.14 Pacer check in 10/17 good for several months   Reproductive/Obstetrics                           Anesthesia Physical Anesthesia Plan  ASA: III  Anesthesia Plan: Spinal   Post-op Pain Management:    Induction:   Airway Management Planned:   Additional Equipment:   Intra-op Plan:   Post-operative Plan:   Informed Consent: I have reviewed the patients History and Physical, chart, labs and discussed the procedure including the risks, benefits and alternatives for the proposed anesthesia with the patient or authorized representative who has indicated his/her understanding and acceptance.   Dental Advisory Given  Plan Discussed with: CRNA  Anesthesia Plan Comments: (Lab work confirmed with CRNA in room. Platelets okay. Discussed spinal anesthetic, and patient consents to the procedure:  included risk of possible headache,backache, failed block, allergic reaction, and nerve injury. This patient was asked if she had any questions or concerns before the procedure started. )        Anesthesia Quick Evaluation

## 2016-05-12 NOTE — Anesthesia Procedure Notes (Signed)
Spinal  Patient location during procedure: OR Preanesthetic Checklist Completed: patient identified, site marked, surgical consent, pre-op evaluation, timeout performed, IV checked, risks and benefits discussed and monitors and equipment checked Spinal Block Patient position: sitting Prep: DuraPrep Patient monitoring: heart rate, cardiac monitor, continuous pulse ox and blood pressure Approach: left paramedian Location: L3-4 Injection technique: single-shot Needle Needle type: Sprotte  Needle gauge: 24 G Needle length: 9 cm Assessment Sensory level: T4 Additional Notes Attempted L34 without CSF Changed to L23 and after 2 attempts with 24 GA sprotte. I changed to 17 GA tuohy and LOr at 6 cm. 25 GA spinal thru sprotte. +CSF: No paresthesia 3cc .5% Marcaine

## 2016-05-12 NOTE — Interval H&P Note (Signed)
History and Physical Interval Note:  05/12/2016 10:12 AM  [place]  has presented today for surgery, with the diagnosis of LEFT HIP OA  The various methods of treatment have been discussed with the patient and family. After consideration of risks, benefits and other options for treatment, the patient has consented to  Procedure(s): LEFT TOTAL HIP ARTHROPLASTY ANTERIOR APPROACH (Left) as a surgical intervention .  The patient's history has been reviewed, patient examined, no change in status, stable for surgery.  I have reviewed the patient's chart and labs.  Questions were answered to the patient's satisfaction.     Mauri Pole

## 2016-05-12 NOTE — Transfer of Care (Signed)
Immediate Anesthesia Transfer of Care Note  Patient: Oscar Baker  Procedure(s) Performed: Procedure(s): LEFT TOTAL HIP ARTHROPLASTY ANTERIOR APPROACH (Left)  Patient Location: PACU  Anesthesia Type:Spinal  Level of Consciousness: sedated, patient cooperative and responds to stimulation  Airway & Oxygen Therapy: Patient Spontanous Breathing and Patient connected to face mask oxygen  Post-op Assessment: Report given to RN and Post -op Vital signs reviewed and stable  Post vital signs: Reviewed and stable  Last Vitals:  Vitals:   05/12/16 0834  BP: (!) 146/74  Pulse: 75  Resp: 18  Temp: 36.6 C    Last Pain:  Vitals:   05/12/16 0834  TempSrc: Oral         Complications: No apparent anesthesia complications

## 2016-05-12 NOTE — Progress Notes (Signed)
ANTICOAGULATION CONSULT NOTE - Initial Consult  Pharmacy Consult for Warfarin Indication: atrial fibrillation  Allergies  Allergen Reactions  . Altace [Ramipril] Other (See Comments)    "throat felt like had a knot in it"  . Codeine Nausea And Vomiting    Nausea and vomiting   . Simvastatin Other (See Comments)    Leg aches    Patient Measurements: Height: 5\' 10"  (177.8 cm) Weight: 225 lb (102.1 kg) IBW/kg (Calculated) : 73  Vital Signs: Temp: 97.5 F (36.4 C) (11/21 1500) Temp Source: Oral (11/21 0834) BP: 146/74 (11/21 0834) Pulse Rate: 64 (11/21 1500)  Labs:  Recent Labs  05/12/16 0910  APTT 26  LABPROT 14.7  INR 1.14    Estimated Creatinine Clearance: 53.3 mL/min (by C-G formula based on SCr of 1.3 mg/dL).   Medical History: Past Medical History:  Diagnosis Date  . CLL (chronic lymphocytic leukemia) ([place])   . Diabetes mellitus type 2, noninsulin dependent ([place])   . Hyperlipidemia   . Hypertension   . Pacemaker 02/08/2014   DR Lovena Le  . prostate ca dx'd 2006   xrt; prostatectomy  . Sick sinus syndrome [facility])    a-Flutter with episodes of bradycardia; S/P Biotronik (serial number [id number])    Assessment:  82 yr male with PMH significant for prostate cancer, CLL and AFib.  On warfarin PTA for AFib  S/P Left THA today  Postop pharmacy consulted to dose warfarin   PTA warfarin regimen = 5 mg daily  Last dose reported as taken on 05/04/16  INR (05/04/16) = 2.4  Goal of Therapy:  INR 2-3   Plan:   Warfarin 7.5mg  po x 1 dose tonight  Check daily PT/INR  Aveena Bari, Toribio Harbour, PharmD 05/12/2016,3:20 PM

## 2016-05-12 NOTE — Anesthesia Postprocedure Evaluation (Signed)
Anesthesia Post Note  Patient: Oscar Baker  Procedure(s) Performed: Procedure(s) (LRB): LEFT TOTAL HIP ARTHROPLASTY ANTERIOR APPROACH (Left)  Patient location during evaluation: PACU Anesthesia Type: Spinal Level of consciousness: awake Pain management: satisfactory to patient Vital Signs Assessment: post-procedure vital signs reviewed and stable Respiratory status: spontaneous breathing Cardiovascular status: blood pressure returned to baseline Postop Assessment: no headache and spinal receding Anesthetic complications: no    Last Vitals:  Vitals:   05/12/16 1705 05/12/16 1805  BP: 133/61 (!) 123/58  Pulse: 67 68  Resp: 15 16  Temp: 36.6 C 36.6 C    Last Pain:  Vitals:   05/12/16 1805  TempSrc: Oral  PainSc:                  [name]

## 2016-05-12 NOTE — Progress Notes (Addendum)
CRITICAL VALUE ALERT  Critical value received: wbc 60.7 Date 05/12/2016  Time of notification:  [id number]  Critical value read back: yes  Nurse who received alert: D Mateo Flow RN  MD notified (1st page):  Yes  Time of first page:  1751 MD notified (2nd page):  Time of second page:  Responding MD:  Rosario Adie PA  Time MD responded: [phone]

## 2016-05-13 ENCOUNTER — Encounter (HOSPITAL_COMMUNITY): Payer: Self-pay | Admitting: Orthopedic Surgery

## 2016-05-13 ENCOUNTER — Other Ambulatory Visit: Payer: Self-pay | Admitting: Cardiovascular Disease

## 2016-05-13 LAB — CBC
HCT: 33.9 % — ABNORMAL LOW (ref 39.0–52.0)
HEMOGLOBIN: 11.6 g/dL — AB (ref 13.0–17.0)
MCH: 34.1 pg — AB (ref 26.0–34.0)
MCHC: 34.2 g/dL (ref 30.0–36.0)
MCV: 99.7 fL (ref 78.0–100.0)
Platelets: 249 10*3/uL (ref 150–400)
RBC: 3.4 MIL/uL — AB (ref 4.22–5.81)
RDW: 19.2 % — ABNORMAL HIGH (ref 11.5–15.5)
WBC: 57.4 10*3/uL (ref 4.0–10.5)

## 2016-05-13 LAB — BASIC METABOLIC PANEL
Anion gap: 6 (ref 5–15)
BUN: 17 mg/dL (ref 6–20)
CHLORIDE: 105 mmol/L (ref 101–111)
CO2: 24 mmol/L (ref 22–32)
CREATININE: 1.08 mg/dL (ref 0.61–1.24)
Calcium: 8.6 mg/dL — ABNORMAL LOW (ref 8.9–10.3)
GFR calc Af Amer: >60 mL/min (ref >60)
GFR calc non Af Amer: >60 mL/min (ref >60)
GLUCOSE: 230 mg/dL — AB (ref 65–99)
POTASSIUM: 4.9 mmol/L (ref 3.5–5.1)
SODIUM: 135 mmol/L (ref 135–145)

## 2016-05-13 LAB — PROTIME-INR
INR: 1.17
Prothrombin Time: 14.9 seconds (ref 11.4–15.2)

## 2016-05-13 LAB — GLUCOSE, CAPILLARY
GLUCOSE-CAPILLARY: 208 mg/dL — AB (ref 65–99)
Glucose-Capillary: 212 mg/dL — ABNORMAL HIGH (ref 65–99)

## 2016-05-13 MED ORDER — POLYETHYLENE GLYCOL 3350 17 G PO PACK: 17.0000 g | PACK | Freq: Two times a day (BID) | ORAL | 0 refills | Status: DC

## 2016-05-13 MED ORDER — WARFARIN SODIUM 5 MG PO TABS: 5.0000 mg | ORAL_TABLET | Freq: Every day | ORAL | Status: DC

## 2016-05-13 MED ORDER — DOCUSATE SODIUM 100 MG PO CAPS: 100.0000 mg | ORAL_CAPSULE | Freq: Two times a day (BID) | ORAL | 0 refills | Status: DC

## 2016-05-13 MED ORDER — METHOCARBAMOL 500 MG PO TABS: 500.0000 mg | ORAL_TABLET | Freq: Four times a day (QID) | ORAL | 0 refills | Status: DC | PRN

## 2016-05-13 MED ORDER — ENOXAPARIN SODIUM 40 MG/0.4ML ~~LOC~~ SOLN: 40.0000 mg | SUBCUTANEOUS | 0 refills | Status: DC

## 2016-05-13 MED ORDER — FERROUS SULFATE 325 (65 FE) MG PO TABS: 325.0000 mg | ORAL_TABLET | Freq: Three times a day (TID) | ORAL | 3 refills | Status: DC

## 2016-05-13 MED ORDER — HYDROCODONE-ACETAMINOPHEN 7.5-325 MG PO TABS: 1.0000 | ORAL_TABLET | ORAL | 0 refills | Status: DC | PRN

## 2016-05-13 NOTE — Progress Notes (Signed)
[place] for Warfarin Indication: atrial fibrillation  Allergies  Allergen Reactions  . Altace [Ramipril] Other (See Comments)    "throat felt like had a knot in it"  . Codeine Nausea And Vomiting    Nausea and vomiting   . Simvastatin Other (See Comments)    Leg aches    Patient Measurements: Height: 5\' 10"  (177.8 cm) Weight: 225 lb (102.1 kg) IBW/kg (Calculated) : 73  Vital Signs: Temp: 97.9 F (36.6 C) (11/22 1006) Temp Source: Oral (11/22 1006) BP: 102/85 (11/22 1006) Pulse Rate: 66 (11/22 1006)  Labs:  Recent Labs  05/12/16 0910 05/12/16 1658 05/13/16 0503  HGB  --  11.9* 11.6*  HCT  --  35.3* 33.9*  PLT  --  243 249  APTT 26  --   --   LABPROT 14.7  --  14.9  INR 1.14  --  1.17  CREATININE  --  0.96 1.08    Estimated Creatinine Clearance: 64.2 mL/min (by C-G formula based on SCr of 1.08 mg/dL).   Medical History: Past Medical History:  Diagnosis Date  . CLL (chronic lymphocytic leukemia) ([place])   . Diabetes mellitus type 2, noninsulin dependent ([place])   . Hyperlipidemia   . Hypertension   . Pacemaker 02/08/2014   DR Lovena Le  . prostate ca dx'd 2006   xrt; prostatectomy  . Sick sinus syndrome [facility])    a-Flutter with episodes of bradycardia; S/P Biotronik (serial number [id number])    Assessment:  82 yr male with PMH significant for prostate cancer, CLL and AFib.  On chronic warfarin for AFib, interrupted for surgery.  S/P Left THA today  Postop pharmacy consulted to dose warfarin   PTA warfarin regimen = 5 mg daily  Last dose reported as taken on 05/04/16  INR (05/04/16) = 2.4  Goal of Therapy:  INR 2-3  Today: Received warfarin 7.5 mg PO x 1 last night No INR response yet, as expected Has orders for discharge home today on warfarin 5 mg PO daily. Has instructions from anticoagulation clinic to call them upon discharge.   Plan:   Warfarin 5 mg PO daily  Patient to contact anticoagulation  clinic today for additional instructions and to arrange next INR.  Clayburn Pert, PharmD, BCPS Pager: [phone] 05/13/2016  12:39 PM   .

## 2016-05-13 NOTE — Progress Notes (Signed)
     Subjective: 1 Day Post-Op Procedure(s) (LRB): LEFT TOTAL HIP ARTHROPLASTY ANTERIOR APPROACH (Left)   Patient reports pain as mild, pain controlled.  No events throughout the night. Ready to be discharged home.   Objective:   VITALS:   Vitals:   05/13/16 0244 05/13/16 0601  BP: 130/70 122/69  Pulse: 70 68  Resp: 16 16  Temp: 97.9 F (36.6 C) 97.8 F (36.6 C)    Dorsiflexion/Plantar flexion intact Incision: dressing C/D/I No cellulitis present Compartment soft  LABS  Recent Labs  05/12/16 1658 05/13/16 0503  HGB 11.9* 11.6*  HCT 35.3* 33.9*  WBC 60.7* 57.4*  PLT 243 249     Recent Labs  05/12/16 1658 05/13/16 0503  NA  --  135  K  --  4.9  BUN  --  17  CREATININE 0.96 1.08  GLUCOSE  --  230*     Assessment/Plan: 1 Day Post-Op Procedure(s) (LRB): LEFT TOTAL HIP ARTHROPLASTY ANTERIOR APPROACH (Left) Foley cath d/c'ed Advance diet Up with therapy D/C IV fluids Discharge home with outpatient monitoring of INR and Coumadin Follow up in 2 weeks at [facility] Follow up with OLIN,Jocilyn Trego D in 2 weeks.  Contact information:  [facility] [address] [place] [id number]    Obese (BMI 30-39.9) Estimated body mass index is 32.28 kg/m as calculated from the following:   Height as of this encounter: 5\' 10"  (1.778 m).   Weight as of this encounter: 102.1 kg (225 lb). Patient also counseled that weight may inhibit the healing process Patient counseled that losing weight will help with future health issues         [place] Pugh. Karlye Ihrig   PAC  05/13/2016, 9:03 AM

## 2016-05-13 NOTE — Discharge Instructions (Signed)

## 2016-05-13 NOTE — Evaluation (Signed)
Physical Therapy One Time Evaluation Patient Details Name: Oscar Baker MRN: [id number] DOB: 1932/12/25 Today's Date: 05/13/2016   History of Present Illness  Pt is an 82 year old male s/p L direct anterior THA  Clinical Impression  Patient evaluated by Physical Therapy with no further acute PT needs identified. All education has been completed and the patient has no further questions. Pt mobilizing well with RW and practiced one step twice.  Pt feels ready for d/c home.  Pt declines f/u PT at this time however wished to review exercises to perform on his own.  Pt provided with HEP and reviewed each exercise and safety.  Pt reported understanding.  PT is signing off. Thank you for this referral.     Follow Up Recommendations No PT follow up    Equipment Recommendations  None recommended by PT    Recommendations for Other Services       Precautions / Restrictions Precautions Precautions: Fall Restrictions Weight Bearing Restrictions: No Other Position/Activity Restrictions: WBAT      Mobility  Bed Mobility               General bed mobility comments: pt up in recliner on arrival  Transfers Overall transfer level: Needs assistance Equipment used: Rolling walker (2 wheeled) Transfers: Sit to/from Stand Sit to Stand: Supervision         General transfer comment: verbal cues for UE and LE positioning  Ambulation/Gait Ambulation/Gait assistance: Min guard Ambulation Distance (Feet): 200 Feet Assistive device: Rolling walker (2 wheeled) Gait Pattern/deviations: Step-through pattern     General Gait Details: slow but steady gait with RW, pt reports no increase in pain  Stairs Stairs: Yes Stairs assistance: Min guard Stair Management: Step to pattern;Forwards;With walker Number of Stairs: 1 General stair comments: pt educated on one step with RW, cues for safety and sequence  Wheelchair Mobility    Modified Rankin (Stroke Patients Only)        Balance                                             Pertinent Vitals/Pain Pain Assessment: No/denies pain    Home Living Family/patient expects to be discharged to:: Private residence Living Arrangements: Spouse/significant other Available Help at Discharge: Family Type of Home: House Home Access: Stairs to enter Entrance Stairs-Rails: None Entrance Stairs-Number of Steps: one and one Home Layout: One level [place]: Shower seat;Grab bars - toilet;Grab bars - tub/shower;Walker - 2 wheels;Walker - 4 wheels      Prior Function Level of Independence: Independent               Hand Dominance        Extremity/Trunk Assessment   Upper Extremity Assessment: Overall WFL for tasks assessed           Lower Extremity Assessment: LLE deficits/detail   LLE Deficits / Details: anticipated post op hip weakness especially hip flexion and abduction     Communication   Communication: HOH  Cognition Arousal/Alertness: Awake/alert Behavior During Therapy: WFL for tasks assessed/performed Overall Cognitive Status: Within Functional Limits for tasks assessed                      General Comments      Exercises     Assessment/Plan    PT Assessment Patent does not need any further  PT services  PT Problem List            PT Treatment Interventions      PT Goals (Current goals can be found in the Care Plan section)  Acute Rehab PT Goals Patient Stated Goal: home ASAP PT Goal Formulation: All assessment and education complete, DC therapy    Frequency     Barriers to discharge        Co-evaluation               End of Session Equipment Utilized During Treatment: Gait belt Activity Tolerance: Patient tolerated treatment well Patient left: in chair;with call bell/phone within reach;with family/visitor present           Time: [id number] PT Time Calculation (min) (ACUTE ONLY): 17 min   Charges:   PT  Evaluation $PT Eval Low Complexity: 1 Procedure     PT G Codes:        Ford Peddie,KATHrine E 05/13/2016, 12:06 PM Carmelia Bake, PT, DPT 05/13/2016 Pager: [phone]

## 2016-05-13 NOTE — Evaluation (Signed)
Occupational Therapy Evaluation Patient Details Name: Oscar Baker MRN: [id number] DOB: 1932-07-13 Today's Date: 05/13/2016    History of Present Illness s/p L DA THA   Clinical Impression   This 82 year old man was admitted for the above sx. All education was completed. No further OT is needed at this time    Follow Up Recommendations  No OT follow up    Equipment Recommendations  None recommended by OT    Recommendations for Other Services       Precautions / Restrictions Precautions Precautions: Fall Restrictions Weight Bearing Restrictions: No      Mobility Bed Mobility               General bed mobility comments: oob  Transfers Overall transfer level: Needs assistance Equipment used: Rolling walker (2 wheeled) Transfers: Sit to/from Stand Sit to Stand: Min guard         General transfer comment: for safety. Cues for UE/LE placement    Balance                                            ADL Overall ADL's : Needs assistance/impaired                         Toilet Transfer: Min guard;Ambulation       Tub/ Shower Transfer: Min guard;Ambulation;Walk-in shower     General ADL Comments: pt stood and used urinal in bathroom. He did not want to complete any ADL tasks at this time:  prefers to wait for his wife.  She will assist him as needed     Vision     Perception     Praxis      Pertinent Vitals/Pain Pain Assessment: No/denies pain     Hand Dominance     Extremity/Trunk Assessment Upper Extremity Assessment Upper Extremity Assessment: Overall WFL for tasks assessed           Communication Communication Communication: HOH   Cognition Arousal/Alertness: Awake/alert Behavior During Therapy: WFL for tasks assessed/performed Overall Cognitive Status: Within Functional Limits for tasks assessed                     General Comments       Exercises       Shoulder Instructions       Home Living Family/patient expects to be discharged to:: Private residence Living Arrangements: Spouse/significant other Available Help at Discharge: Family               Bathroom Shower/Tub: Walk-in Corporate treasurer Toilet: Handicapped height     Home Equipment: Shower seat;Grab bars - toilet;Grab bars - tub/shower          Prior Functioning/Environment Level of Independence: Independent                 OT Problem List:     OT Treatment/Interventions:      OT Goals(Current goals can be found in the care plan section) Acute Rehab OT Goals Patient Stated Goal: home ASAP OT Goal Formulation: All assessment and education complete, DC therapy  OT Frequency:     Barriers to D/C:            Co-evaluation              End of Session  Activity Tolerance: Patient tolerated treatment well Patient left: in chair;with call bell/phone within reach   Time: [id number] OT Time Calculation (min): 10 min Charges:  OT General Charges $OT Visit: 1 Procedure OT Evaluation $OT Eval Low Complexity: 1 Procedure G-Codes:    Keyaira Clapham 2016/05/17, 10:42 AM  Lesle Chris, OTR/L [phone] 05-17-16

## 2016-05-13 NOTE — Progress Notes (Signed)
Spoke with patient and spouse at bedside. They do not plan on any home PT. Patient states his wife will be his primary caregiver, he has all the DME that he will need from previous surgeries. No further needs assessed.

## 2016-05-18 NOTE — Discharge Summary (Signed)
Physician Discharge Summary  Patient ID: Oscar Baker MRN: [id number] DOB/AGE: 1933/03/26 82 y.o.  Admit date: 05/12/2016 Discharge date: 05/13/2016   Procedures:  Procedure(s) (LRB): LEFT TOTAL HIP ARTHROPLASTY ANTERIOR APPROACH (Left)  Attending Physician:  Dr. Paralee Cancel   Admission Diagnoses:   Left hip primary OA / pain  Discharge Diagnoses:  Principal Problem:   S/P left THA, AA  Past Medical History:  Diagnosis Date  . CLL (chronic lymphocytic leukemia) ([place])   . Diabetes mellitus type 2, noninsulin dependent ([place])   . Hyperlipidemia   . Hypertension   . Pacemaker 02/08/2014   DR Lovena Le  . prostate ca dx'd 2006   xrt; prostatectomy  . Sick sinus syndrome [facility])    a-Flutter with episodes of bradycardia; S/P Biotronik (serial number [id number])    HPI:    Oscar Baker, 82 y.o. male, has a history of pain and functional disability in the left hip(s) due to arthritis and patient has failed non-surgical conservative treatments for greater than 12 weeks to include NSAID's and/or analgesics and activity modification.  Onset of symptoms was gradual starting 1+ years ago with gradually worsening course since that time.The patient noted no past surgery on the left hip(s).  Patient currently rates pain in the left hip at 8 out of 10 with activity. Patient has night pain, worsening of pain with activity and weight bearing, trendelenberg gait, pain that interfers with activities of daily living, pain with passive range of motion, crepitus and joint swelling. Patient has evidence of periarticular osteophytes and joint space narrowing by imaging studies. This condition presents safety issues increasing the risk of falls.  There is no current active infection.   Risks, benefits and expectations were discussed with the patient.  Risks including but not limited to the risk of anesthesia, blood clots, nerve damage, blood vessel damage, failure of the prosthesis, infection and up to and  including death.  Patient understand the risks, benefits and expectations and wishes to proceed with surgery.   PCP: [name], MD   Discharged Condition: good  Hospital Course:  Patient underwent the above stated procedure on 05/12/2016. Patient tolerated the procedure well and brought to the recovery room in good condition and subsequently to the floor.  POD #1 BP: 122/69 ; Pulse: 68 ; Temp: 97.8 F (36.6 C) ; Resp: 16 Patient reports pain as mild, pain controlled.  No events throughout the night. Ready to be discharged home. Dorsiflexion/plantar flexion intact, incision: dressing C/D/I, no cellulitis present and compartment soft.   LABS  Basename    HGB     11.6  HCT     33.9    Discharge Exam: General appearance: alert, cooperative and no distress Extremities: Homans sign is negative, no sign of DVT, no edema, redness or tenderness in the calves or thighs and no ulcers, gangrene or trophic changes  Disposition: Home with follow up in 2 weeks   Follow-up Information    Mauri Pole, MD. Schedule an appointment as soon as possible for a visit in 2 week(s).   Specialty:  Orthopedic Surgery Contact information: [address] [place] [address] [phone]           Discharge Instructions    Call MD / Call 911    Complete by:  As directed    If you experience chest pain or shortness of breath, CALL 911 and be transported to the hospital emergency room.  If you develope a fever above 101 F,  pus (white drainage) or increased drainage or redness at the wound, or calf pain, call your surgeon's office.   Change dressing    Complete by:  As directed    Maintain surgical dressing until follow up in the clinic. If the edges start to pull up, may reinforce with tape. If the dressing is no longer working, may remove and cover with gauze and tape, but must keep the area dry and clean.  Call with any questions or concerns.   Constipation Prevention     Complete by:  As directed    Drink plenty of fluids.  Prune juice may be helpful.  You may use a stool softener, such as Colace (over the counter) 100 mg twice a day.  Use MiraLax (over the counter) for constipation as needed.   Diet - low sodium heart healthy    Complete by:  As directed    Discharge instructions    Complete by:  As directed    Maintain surgical dressing until follow up in the clinic. If the edges start to pull up, may reinforce with tape. If the dressing is no longer working, may remove and cover with gauze and tape, but must keep the area dry and clean.  Follow up in 2 weeks at [facility]. Call with any questions or concerns.   Increase activity slowly as tolerated    Complete by:  As directed    Weight bearing as tolerated with assist device (walker, cane, etc) as directed, use it as long as suggested by your surgeon or therapist, typically at least 4-6 weeks.   TED hose    Complete by:  As directed    Use stockings (TED hose) for 2 weeks on both leg(s).  You may remove them at night for sleeping.        Medication List    STOP taking these medications   acetaminophen 500 MG tablet Commonly known as:  TYLENOL     TAKE these medications   amLODipine 10 MG tablet Commonly known as:  NORVASC Take 10 mg by mouth at bedtime.   aspirin 81 MG tablet Take 81 mg by mouth at bedtime. Will stop prior to surgery   atorvastatin 20 MG tablet Commonly known as:  LIPITOR Take 1 tablet (20 mg total) by mouth at bedtime.   docusate sodium 100 MG capsule Commonly known as:  COLACE Take 1 capsule (100 mg total) by mouth 2 (two) times daily. What changed:  medication strength  how much to take  when to take this  reasons to take this  additional instructions   enoxaparin 40 MG/0.4ML injection Commonly known as:  LOVENOX Inject 0.4 mLs (40 mg total) into the skin daily.   ferrous sulfate 325 (65 FE) MG tablet Take 1 tablet (325 mg total) by mouth 3  (three) times daily after meals.   GLIPIZIDE XL 2.5 MG 24 hr tablet Generic drug:  glipiZIDE Take 2.5 mg by mouth daily with breakfast.   HYDROcodone-acetaminophen 7.5-325 MG tablet Commonly known as:  NORCO Take 1-2 tablets by mouth every 4 (four) hours as needed for moderate pain.   metFORMIN 1000 MG tablet Commonly known as:  GLUCOPHAGE Take 1,000 mg by mouth 2 (two) times daily with a meal.   methocarbamol 500 MG tablet Commonly known as:  ROBAXIN Take 1 tablet (500 mg total) by mouth every 6 (six) hours as needed for muscle spasms.   multivitamin per tablet Take 1 tablet by mouth daily. Will stop  prior to procedure   polyethylene glycol packet Commonly known as:  MIRALAX / GLYCOLAX Take 17 g by mouth 2 (two) times daily.   sitaGLIPtin 100 MG tablet Commonly known as:  JANUVIA Take 100 mg by mouth at bedtime.   vitamin B-12 1000 MCG tablet Commonly known as:  CYANOCOBALAMIN Take 1,000 mcg by mouth daily. Will stop prior to procedure   warfarin 5 MG tablet Commonly known as:  COUMADIN Take as directed by coumadin clinic        Signed: West Pugh. Kenneth Lax   PA-C  05/18/2016, 9:45 AM

## 2016-05-19 ENCOUNTER — Ambulatory Visit (INDEPENDENT_AMBULATORY_CARE_PROVIDER_SITE_OTHER): Payer: Medicare Other | Admitting: Pharmacist

## 2016-05-19 DIAGNOSIS — I4892 Unspecified atrial flutter: Secondary | ICD-10-CM

## 2016-05-19 DIAGNOSIS — I4891 Unspecified atrial fibrillation: Secondary | ICD-10-CM

## 2016-05-19 LAB — POCT INR: INR: 1.6

## 2016-05-27 ENCOUNTER — Ambulatory Visit (INDEPENDENT_AMBULATORY_CARE_PROVIDER_SITE_OTHER): Payer: Medicare Other | Admitting: *Deleted

## 2016-05-27 DIAGNOSIS — I4892 Unspecified atrial flutter: Secondary | ICD-10-CM | POA: Diagnosis not present

## 2016-05-27 DIAGNOSIS — I4891 Unspecified atrial fibrillation: Secondary | ICD-10-CM

## 2016-05-27 DIAGNOSIS — Z96642 Presence of left artificial hip joint: Secondary | ICD-10-CM | POA: Diagnosis not present

## 2016-05-27 DIAGNOSIS — Z471 Aftercare following joint replacement surgery: Secondary | ICD-10-CM | POA: Diagnosis not present

## 2016-05-27 LAB — POCT INR: INR: 2.7

## 2016-06-02 ENCOUNTER — Ambulatory Visit (INDEPENDENT_AMBULATORY_CARE_PROVIDER_SITE_OTHER): Payer: Medicare Other | Admitting: *Deleted

## 2016-06-02 ENCOUNTER — Ambulatory Visit (INDEPENDENT_AMBULATORY_CARE_PROVIDER_SITE_OTHER): Payer: Medicare Other | Admitting: Cardiovascular Disease

## 2016-06-02 ENCOUNTER — Encounter: Payer: Self-pay | Admitting: Cardiovascular Disease

## 2016-06-02 VITALS — BP 116/68 | HR 81 | Ht 70.0 in | Wt 219.0 lb

## 2016-06-02 DIAGNOSIS — I495 Sick sinus syndrome: Secondary | ICD-10-CM | POA: Diagnosis not present

## 2016-06-02 DIAGNOSIS — I482 Chronic atrial fibrillation, unspecified: Secondary | ICD-10-CM

## 2016-06-02 DIAGNOSIS — I4892 Unspecified atrial flutter: Secondary | ICD-10-CM

## 2016-06-02 DIAGNOSIS — I4891 Unspecified atrial fibrillation: Secondary | ICD-10-CM

## 2016-06-02 LAB — POCT INR: INR: 2.5

## 2016-06-02 NOTE — Patient Instructions (Signed)
Medication Instructions:  Your physician recommends that you continue on your current medications as directed. Please refer to the Current Medication list given to you today.   Labwork: None ordered   Testing/Procedures: None ordered   Follow-Up: Your physician wants you to follow-up in: 12 months with Dr Vilinda Boehringer will receive a reminder letter in the mail two months in advance. If you don't receive a letter, please call our office to schedule the follow-up appointment.   Any Other Special Instructions Will Be Listed Below (If Applicable).     If you need a refill on your cardiac medications before your next appointment, please call your pharmacy.

## 2016-06-02 NOTE — Progress Notes (Signed)
[place] Date of Birth  August 24, 1932 [place] [address]    [place]   [place], [place]  [address]    [place], [place]  [address] [phone]  Fax  [phone]  [phone]  Fax [phone]  Problem list: 1. Sick sinus syndrome with episodes of atrial flutter, frequent episodes of bradycardia - s/p pacer Aug. 2015.  2. Hypertension 3. Dyslipidemia 4. Diabetes mellitus 5. Arthroscopic knee surgery  History of Present Illness:  Oscar Baker is doing well.  He has asymptomatic bradycardia that we have followed since July 2000.  He has never had any syncope.  He had left knee surgery a year ago and right knee surgery in Dec. 2012.  No cardiac complaints.    He has done well since I last saw him.  He has bad knees and has not been walking much - he tries to avoid salt as much as possible.  He denies any chest pain, shortness breath, or syncope.  July 13, 2012 Oscar Baker is doing well.  No syncope or presyncope.  [name]   January 16, 2013:  Oscar Baker is doing well.  His HR continues to be slow.  He is asymptomatic - no syncope or presyncope.  Feb. 24, 2015:  Oscar Baker is doing well.  No syncope. No pre-syncope.  Getting his coumadin checked monthly.  No cp or dyspnea.  Had a URI this winter. We walked around the office and he increased his HR to 63.  He is able to get his heart rate up into the 70s when he is working out at [employer].  December 19, 2013:  Oscar Baker is here as a work in for fatigue and slow HR.    He has not had any episodes of syncope.   No chest pain , no dyspnea .  Dec. 30, 2015:  Oscar Baker is a 82 yo with hx of bradycardia and pacer,   placement, HTN, DM, -   No CP or dyspnea. Has had URI,  Has been on several courses of Abx.  I suggested Zyrtec or claritan   Dec. 6, 2016:  Doing well .  No CP , no dyspnea.   Has had a chronic sinus drainage.  ?  Allergies, ? GERD    Dec. 12, 2017:  Doing well,     Seen with wife , Oscar Baker  Left  hip replacement 3 weeks ago .   Oscar Baker)  No cardiac complaints.       Current Outpatient Prescriptions on File Prior to Visit  Medication Sig Dispense Refill  . amLODipine (NORVASC) 10 MG tablet Take 10 mg by mouth at bedtime.   2  . aspirin 81 MG tablet Take 81 mg by mouth at bedtime. Will stop prior to surgery    . atorvastatin (LIPITOR) 20 MG tablet Take 1 tablet (20 mg total) by mouth at bedtime. 90 tablet 3  . docusate sodium (COLACE) 100 MG capsule Take 1 capsule (100 mg total) by mouth 2 (two) times daily. 10 capsule 0  . fenofibrate micronized (LOFIBRA) 200 MG capsule TAKE 1 CAPSULE BY MOUTH DAILY BEFORE BREAKFAST 90 capsule 0  . GLIPIZIDE XL 2.5 MG 24 hr tablet Take 2.5 mg by mouth daily with breakfast.   2  . metFORMIN (GLUCOPHAGE) 1000 MG tablet Take 1,000 mg by mouth 2 (two) times daily with a  meal.      . multivitamin (THERAGRAN) per tablet Take 1 tablet by mouth daily. Will stop prior to procedure    . vitamin B-12 (CYANOCOBALAMIN) 1000 MCG tablet Take 1,000 mcg by mouth daily. Will stop prior to procedure    . warfarin (COUMADIN) 5 MG tablet Take as directed by coumadin clinic 90 tablet 0   No current facility-administered medications on file prior to visit.     Allergies  Allergen Reactions  . Altace [Ramipril] Other (See Comments)    "throat felt like had a knot in it"  . Codeine Nausea And Vomiting    Nausea and vomiting   . Simvastatin Other (See Comments)    Leg aches    Past Medical History:  Diagnosis Date  . CLL (chronic lymphocytic leukemia) ([place])   . Diabetes mellitus type 2, noninsulin dependent ([place])   . Hyperlipidemia   . Hypertension   . Pacemaker 02/08/2014   DR Lovena Le  . prostate ca dx'd 2006   xrt; prostatectomy  . Sick sinus syndrome [facility])    a-Flutter with episodes of bradycardia; S/P Biotronik (serial number [id number])    Past Surgical History:   Procedure Laterality Date  . APPENDECTOMY    . BACK SURGERY     disk  . INSERT / REPLACE / REMOVE PACEMAKER  02/08/2014    DR Lovena Le  . PACEMAKER INSERTION  02/08/2014   Biotronik (serial number [id number])  . PERMANENT PACEMAKER INSERTION N/A 02/08/2014   Procedure: PERMANENT PACEMAKER INSERTION;  Surgeon: Evans Lance, MD;  Location: [facility] CATH LAB;  Service: Cardiovascular;  Laterality: N/A;  . TONSILLECTOMY    . TOTAL HIP ARTHROPLASTY Left 05/12/2016   Procedure: LEFT TOTAL HIP ARTHROPLASTY ANTERIOR APPROACH;  Surgeon: Paralee Cancel, MD;  Location: [facility] ORS;  Service: Orthopedics;  Laterality: Left;  . TOTAL KNEE ARTHROPLASTY      History  Smoking Status  . Former Smoker  . Years: 25.00  . Types: Pipe, Cigars  . Quit date: 11/25/1975  Smokeless Tobacco  . Never Used    History  Alcohol Use  . 0.0 oz/week    Comment: occasional    Family History  Problem Relation Age of Onset  . Emphysema Mother   . Allergic rhinitis Mother   . Heart attack Father   . Allergic rhinitis Brother   . Pulmonary fibrosis Brother     Reviw of Systems:  Reviewed in the HPI.  All other systems are negative.  Physical Exam: Blood pressure 116/68, pulse 81, height 5\' 10"  (1.778 m), weight 219 lb (99.3 kg). General: Well developed, well nourished, in no acute distress.  Head: Normocephalic, atraumatic, sclera non-icteric, mucus membranes are moist,   Neck: Supple. Carotids are 2 + without bruits. No JVD  Lungs: bilateral wheezing  Heart: regular rate  With normal  S1 S2. No murmurs, gallops or rubs.  Abdomen: Soft, non-tender, non-distended with normal bowel sounds. No hepatomegaly. No rebound/guarding. No masses.  Msk:  Strength and tone are normal  Extremities: No clubbing or cyanosis. Trace bilateral edema.  Distal pedal pulses are 2+ and equal bilaterally.  Neuro: Alert and oriented X 3. Moves all extremities spontaneously.  Psych:  Responds to questions appropriately with a normal  affect.  ECG  DEc. 12, 2017:  A sensing with V pacing   Assessment / Plan:   1. Sick sinus syndrome with episodes of atrial flutter, frequent episodes of bradycardia - s/p pacer Aug. 2015.  2. Hypertension- his blood pressure is  well-controlled. Continue current medications. 3. Dyslipidemia-    4. Diabetes mellitus 5. Arthroscopic knee surgery   Mertie Moores, MD  06/02/2016 9:08 AM    [facility] [place],  [place], [place]  [address] Pager [phone] Phone: [phone]; Fax: [phone]   [facility]  [address] [place], [place]  [address] [phone]   Fax [phone]

## 2016-06-11 ENCOUNTER — Other Ambulatory Visit: Payer: Self-pay | Admitting: Cardiovascular Disease

## 2016-06-24 DIAGNOSIS — Z96642 Presence of left artificial hip joint: Secondary | ICD-10-CM | POA: Diagnosis not present

## 2016-06-24 DIAGNOSIS — M25562 Pain in left knee: Secondary | ICD-10-CM | POA: Diagnosis not present

## 2016-06-24 DIAGNOSIS — Z471 Aftercare following joint replacement surgery: Secondary | ICD-10-CM | POA: Diagnosis not present

## 2016-06-30 DIAGNOSIS — N183 Chronic kidney disease, stage 3 (moderate): Secondary | ICD-10-CM | POA: Diagnosis not present

## 2016-06-30 DIAGNOSIS — Z950 Presence of cardiac pacemaker: Secondary | ICD-10-CM | POA: Diagnosis not present

## 2016-06-30 DIAGNOSIS — E119 Type 2 diabetes mellitus without complications: Secondary | ICD-10-CM | POA: Diagnosis not present

## 2016-06-30 DIAGNOSIS — M1990 Unspecified osteoarthritis, unspecified site: Secondary | ICD-10-CM | POA: Diagnosis not present

## 2016-06-30 DIAGNOSIS — I4892 Unspecified atrial flutter: Secondary | ICD-10-CM | POA: Diagnosis not present

## 2016-06-30 DIAGNOSIS — C9100 Acute lymphoblastic leukemia not having achieved remission: Secondary | ICD-10-CM | POA: Diagnosis not present

## 2016-06-30 DIAGNOSIS — I129 Hypertensive chronic kidney disease with stage 1 through stage 4 chronic kidney disease, or unspecified chronic kidney disease: Secondary | ICD-10-CM | POA: Diagnosis not present

## 2016-06-30 DIAGNOSIS — E668 Other obesity: Secondary | ICD-10-CM | POA: Diagnosis not present

## 2016-06-30 DIAGNOSIS — Z6831 Body mass index (BMI) 31.0-31.9, adult: Secondary | ICD-10-CM | POA: Diagnosis not present

## 2016-06-30 DIAGNOSIS — I2789 Other specified pulmonary heart diseases: Secondary | ICD-10-CM | POA: Diagnosis not present

## 2016-07-07 ENCOUNTER — Ambulatory Visit (INDEPENDENT_AMBULATORY_CARE_PROVIDER_SITE_OTHER): Payer: Medicare Other | Admitting: *Deleted

## 2016-07-07 DIAGNOSIS — I4891 Unspecified atrial fibrillation: Secondary | ICD-10-CM | POA: Diagnosis not present

## 2016-07-07 DIAGNOSIS — I495 Sick sinus syndrome: Secondary | ICD-10-CM

## 2016-07-07 DIAGNOSIS — H6693 Otitis media, unspecified, bilateral: Secondary | ICD-10-CM | POA: Diagnosis not present

## 2016-07-07 DIAGNOSIS — H6063 Unspecified chronic otitis externa, bilateral: Secondary | ICD-10-CM | POA: Diagnosis not present

## 2016-07-07 DIAGNOSIS — I4892 Unspecified atrial flutter: Secondary | ICD-10-CM

## 2016-07-07 DIAGNOSIS — H6121 Impacted cerumen, right ear: Secondary | ICD-10-CM | POA: Diagnosis not present

## 2016-07-07 LAB — POCT INR: INR: 2.3

## 2016-07-07 NOTE — Progress Notes (Signed)
Remote pacemaker transmission.   

## 2016-07-14 LAB — CUP PACEART REMOTE DEVICE CHECK
Brady Statistic RV Percent Paced: 91 %
Date Time Interrogation Session: 20180123160822
Implantable Lead Implant Date: 20150820
Implantable Lead Location: 753860
Lead Channel Sensing Intrinsic Amplitude: 3.5 mV
Lead Channel Setting Pacing Amplitude: 2.4 V
Lead Channel Setting Pacing Pulse Width: 0.4 ms
MDC IDC LEAD SERIAL: 29625633
MDC IDC MSMT LEADCHNL RV IMPEDANCE VALUE: 507 Ohm
MDC IDC MSMT LEADCHNL RV PACING THRESHOLD AMPLITUDE: 0.6 V
MDC IDC MSMT LEADCHNL RV PACING THRESHOLD PULSEWIDTH: 0.4 ms
MDC IDC PG IMPLANT DT: 20150820
Pulse Gen Serial Number: 68372291

## 2016-07-15 ENCOUNTER — Encounter: Payer: Self-pay | Admitting: Cardiology

## 2016-07-27 DIAGNOSIS — H6061 Unspecified chronic otitis externa, right ear: Secondary | ICD-10-CM | POA: Diagnosis not present

## 2016-07-27 DIAGNOSIS — H903 Sensorineural hearing loss, bilateral: Secondary | ICD-10-CM | POA: Diagnosis not present

## 2016-07-29 ENCOUNTER — Encounter: Payer: Self-pay | Admitting: Cardiology

## 2016-08-04 ENCOUNTER — Other Ambulatory Visit: Payer: Self-pay | Admitting: Cardiovascular Disease

## 2016-08-04 DIAGNOSIS — C9110 Chronic lymphocytic leukemia of B-cell type not having achieved remission: Secondary | ICD-10-CM

## 2016-08-05 DIAGNOSIS — Z471 Aftercare following joint replacement surgery: Secondary | ICD-10-CM | POA: Diagnosis not present

## 2016-08-05 DIAGNOSIS — Z96642 Presence of left artificial hip joint: Secondary | ICD-10-CM | POA: Diagnosis not present

## 2016-08-18 ENCOUNTER — Ambulatory Visit (INDEPENDENT_AMBULATORY_CARE_PROVIDER_SITE_OTHER): Payer: Medicare Other | Admitting: *Deleted

## 2016-08-18 DIAGNOSIS — I4892 Unspecified atrial flutter: Secondary | ICD-10-CM

## 2016-08-18 DIAGNOSIS — H60549 Acute eczematoid otitis externa, unspecified ear: Secondary | ICD-10-CM | POA: Diagnosis not present

## 2016-08-18 DIAGNOSIS — I4891 Unspecified atrial fibrillation: Secondary | ICD-10-CM

## 2016-08-18 DIAGNOSIS — H6063 Unspecified chronic otitis externa, bilateral: Secondary | ICD-10-CM | POA: Diagnosis not present

## 2016-08-18 DIAGNOSIS — H903 Sensorineural hearing loss, bilateral: Secondary | ICD-10-CM | POA: Diagnosis not present

## 2016-08-18 DIAGNOSIS — H6122 Impacted cerumen, left ear: Secondary | ICD-10-CM | POA: Diagnosis not present

## 2016-08-18 LAB — POCT INR: INR: 1.7

## 2016-08-19 DIAGNOSIS — L570 Actinic keratosis: Secondary | ICD-10-CM | POA: Diagnosis not present

## 2016-08-19 DIAGNOSIS — L219 Seborrheic dermatitis, unspecified: Secondary | ICD-10-CM | POA: Diagnosis not present

## 2016-09-01 DIAGNOSIS — H903 Sensorineural hearing loss, bilateral: Secondary | ICD-10-CM | POA: Diagnosis not present

## 2016-09-01 DIAGNOSIS — H6063 Unspecified chronic otitis externa, bilateral: Secondary | ICD-10-CM | POA: Diagnosis not present

## 2016-09-07 ENCOUNTER — Other Ambulatory Visit: Payer: Self-pay | Admitting: Cardiovascular Disease

## 2016-09-08 ENCOUNTER — Ambulatory Visit (INDEPENDENT_AMBULATORY_CARE_PROVIDER_SITE_OTHER): Payer: Medicare Other

## 2016-09-08 DIAGNOSIS — I4891 Unspecified atrial fibrillation: Secondary | ICD-10-CM

## 2016-09-08 DIAGNOSIS — I4892 Unspecified atrial flutter: Secondary | ICD-10-CM

## 2016-09-08 LAB — POCT INR: INR: 2.5

## 2016-09-15 ENCOUNTER — Telehealth: Payer: Self-pay

## 2016-09-15 NOTE — Telephone Encounter (Signed)
Called pt and informed him that Dr. Lovena Le would like for pt to come into the office for an apt. Informed pt that a scheduler would be calling to make the apt. Pt voiced understanding.

## 2016-09-29 DIAGNOSIS — B078 Other viral warts: Secondary | ICD-10-CM | POA: Diagnosis not present

## 2016-09-29 DIAGNOSIS — L570 Actinic keratosis: Secondary | ICD-10-CM | POA: Diagnosis not present

## 2016-09-29 DIAGNOSIS — L219 Seborrheic dermatitis, unspecified: Secondary | ICD-10-CM | POA: Diagnosis not present

## 2016-10-06 ENCOUNTER — Encounter: Payer: Medicare Other | Admitting: *Deleted

## 2016-10-06 ENCOUNTER — Telehealth: Payer: Self-pay | Admitting: Cardiology

## 2016-10-06 NOTE — Telephone Encounter (Signed)
Patient is returning your call,thanks. °

## 2016-10-06 NOTE — Telephone Encounter (Signed)
LMOVM for pt to return call 

## 2016-10-07 NOTE — Telephone Encounter (Signed)
Spoke w/ pt and informed him that his home monitor has not updated since 10-03-16. Pt stated that he is out of town and will be home on Saturday 10-10-2016. Informed pt that his monitor will communicate with his device at that time and he doesn't have to do anything. Pt verbalized understanding.

## 2016-10-12 ENCOUNTER — Ambulatory Visit (INDEPENDENT_AMBULATORY_CARE_PROVIDER_SITE_OTHER): Payer: Medicare Other | Admitting: *Deleted

## 2016-10-12 DIAGNOSIS — I495 Sick sinus syndrome: Secondary | ICD-10-CM | POA: Diagnosis not present

## 2016-10-12 NOTE — Progress Notes (Signed)
Remote pacemaker transmission.   

## 2016-10-13 ENCOUNTER — Ambulatory Visit (INDEPENDENT_AMBULATORY_CARE_PROVIDER_SITE_OTHER): Payer: Medicare Other | Admitting: *Deleted

## 2016-10-13 DIAGNOSIS — I4891 Unspecified atrial fibrillation: Secondary | ICD-10-CM

## 2016-10-13 DIAGNOSIS — I4892 Unspecified atrial flutter: Secondary | ICD-10-CM

## 2016-10-13 LAB — POCT INR: INR: 2.1

## 2016-10-15 ENCOUNTER — Encounter: Payer: Self-pay | Admitting: Cardiology

## 2016-10-16 LAB — CUP PACEART REMOTE DEVICE CHECK
Implantable Lead Location: 753860
Implantable Lead Serial Number: 29625633
Implantable Pulse Generator Implant Date: 20150820
Lead Channel Setting Pacing Pulse Width: 0.4 ms
MDC IDC LEAD IMPLANT DT: 20150820
MDC IDC SESS DTM: 20180427095544
MDC IDC SET LEADCHNL RV PACING AMPLITUDE: 2.4 V
Pulse Gen Model: 394934
Pulse Gen Serial Number: 68372291

## 2016-10-20 DIAGNOSIS — Z6832 Body mass index (BMI) 32.0-32.9, adult: Secondary | ICD-10-CM | POA: Diagnosis not present

## 2016-10-20 DIAGNOSIS — D692 Other nonthrombocytopenic purpura: Secondary | ICD-10-CM | POA: Diagnosis not present

## 2016-10-20 DIAGNOSIS — R682 Dry mouth, unspecified: Secondary | ICD-10-CM | POA: Diagnosis not present

## 2016-10-20 DIAGNOSIS — I4892 Unspecified atrial flutter: Secondary | ICD-10-CM | POA: Diagnosis not present

## 2016-10-20 DIAGNOSIS — E119 Type 2 diabetes mellitus without complications: Secondary | ICD-10-CM | POA: Diagnosis not present

## 2016-10-20 DIAGNOSIS — I129 Hypertensive chronic kidney disease with stage 1 through stage 4 chronic kidney disease, or unspecified chronic kidney disease: Secondary | ICD-10-CM | POA: Diagnosis not present

## 2016-10-20 DIAGNOSIS — C9100 Acute lymphoblastic leukemia not having achieved remission: Secondary | ICD-10-CM | POA: Diagnosis not present

## 2016-10-20 DIAGNOSIS — I2789 Other specified pulmonary heart diseases: Secondary | ICD-10-CM | POA: Diagnosis not present

## 2016-10-20 DIAGNOSIS — E668 Other obesity: Secondary | ICD-10-CM | POA: Diagnosis not present

## 2016-10-29 ENCOUNTER — Encounter: Payer: Self-pay | Admitting: Cardiology

## 2016-11-03 ENCOUNTER — Ambulatory Visit (HOSPITAL_BASED_OUTPATIENT_CLINIC_OR_DEPARTMENT_OTHER): Payer: Medicare Other | Admitting: Oncology

## 2016-11-03 ENCOUNTER — Other Ambulatory Visit (HOSPITAL_BASED_OUTPATIENT_CLINIC_OR_DEPARTMENT_OTHER): Payer: Medicare Other

## 2016-11-03 ENCOUNTER — Telehealth: Payer: Self-pay | Admitting: Oncology

## 2016-11-03 VITALS — BP 128/59 | HR 86 | Temp 98.0°F | Resp 18 | Ht 70.0 in | Wt 221.8 lb

## 2016-11-03 DIAGNOSIS — C9110 Chronic lymphocytic leukemia of B-cell type not having achieved remission: Secondary | ICD-10-CM

## 2016-11-03 DIAGNOSIS — M25559 Pain in unspecified hip: Secondary | ICD-10-CM

## 2016-11-03 DIAGNOSIS — C9190 Lymphoid leukemia, unspecified not having achieved remission: Secondary | ICD-10-CM

## 2016-11-03 LAB — CBC WITH DIFFERENTIAL/PLATELET
BASO%: 0.3 % (ref 0.0–2.0)
Basophils Absolute: 0.1 10*3/uL (ref 0.0–0.1)
EOS%: 2.4 % (ref 0.0–7.0)
Eosinophils Absolute: 1 10*3/uL — ABNORMAL HIGH (ref 0.0–0.5)
HCT: 40.2 % (ref 38.4–49.9)
HGB: 13.3 g/dL (ref 13.0–17.1)
LYMPH%: 87 % — AB (ref 14.0–49.0)
MCH: 33.5 pg — ABNORMAL HIGH (ref 27.2–33.4)
MCHC: 33.1 g/dL (ref 32.0–36.0)
MCV: 101.1 fL — ABNORMAL HIGH (ref 79.3–98.0)
MONO#: 0.3 10*3/uL (ref 0.1–0.9)
MONO%: 0.7 % (ref 0.0–14.0)
NEUT%: 9.6 % — ABNORMAL LOW (ref 39.0–75.0)
NEUTROS ABS: 4.1 10*3/uL (ref 1.5–6.5)
PLATELETS: 279 10*3/uL (ref 140–400)
RBC: 3.97 10*6/uL — AB (ref 4.20–5.82)
RDW: 21.6 % — AB (ref 11.0–14.6)
WBC: 42.7 10*3/uL — AB (ref 4.0–10.3)
lymph#: 37.1 10*3/uL — ABNORMAL HIGH (ref 0.9–3.3)

## 2016-11-03 LAB — COMPREHENSIVE METABOLIC PANEL
ALT: 22 U/L (ref 0–55)
ANION GAP: 11 meq/L (ref 3–11)
AST: 33 U/L (ref 5–34)
Albumin: 3.9 g/dL (ref 3.5–5.0)
Alkaline Phosphatase: 60 U/L (ref 40–150)
BILIRUBIN TOTAL: 0.59 mg/dL (ref 0.20–1.20)
BUN: 20.2 mg/dL (ref 7.0–26.0)
CO2: 23 meq/L (ref 22–29)
CREATININE: 1.3 mg/dL (ref 0.7–1.3)
Calcium: 9.4 mg/dL (ref 8.4–10.4)
Chloride: 109 mEq/L (ref 98–109)
EGFR: 53 mL/min/{1.73_m2} — ABNORMAL LOW (ref >90)
GLUCOSE: 133 mg/dL (ref 70–140)
Potassium: 4.8 mEq/L (ref 3.5–5.1)
SODIUM: 142 meq/L (ref 136–145)
TOTAL PROTEIN: 6.8 g/dL (ref 6.4–8.3)

## 2016-11-03 LAB — TECHNOLOGIST REVIEW

## 2016-11-03 LAB — LACTATE DEHYDROGENASE: LDH: 167 U/L (ref 125–245)

## 2016-11-03 NOTE — Progress Notes (Signed)
Hematology and Oncology Follow Up Visit  Oscar Baker [id number] 1933/04/11 83 y.o. 11/03/2016 8:24 AM [name], MDRus[place], [name], MD   Principle Diagnosis: 83 year old gentleman with CLL diagnosed in August 2015. He presented with leukocytosis and mild lymphadenopathy.  Current therapy: Observation and surveillance:  Interim History:  [name] presents today for a follow-up visit. Since the last visit, he underwent left total hip replacement completed in November 2017. He did require 3 months of rehabilitation with improvement in his mobility and no longer reporting hip pain. He is exercising and ambulating without any major difficulties. He denied any falls or syncope. He denied any pathological fractures.    He does not report any constitutional symptoms of fevers or chills or sweats. He does not report any painful adenopathy or change in his performance status. He reports that his appetite is excellent and his weight is stable.   He does not report any headaches or blurry vision or syncope. He does not report any chest pain, palpitation or shortness of breath. He does not report any nausea, vomiting, abdominal pain. He does not report any frequency urgency or hesitancy. He does not report any skeletal complaints. Rest of his review of systems unremarkable.  Medications: I have reviewed the patient's current medications.  Current Outpatient Prescriptions  Medication Sig Dispense Refill  . amLODipine (NORVASC) 10 MG tablet Take 10 mg by mouth at bedtime.   2  . aspirin 81 MG tablet Take 81 mg by mouth at bedtime. Will stop prior to surgery    . atorvastatin (LIPITOR) 20 MG tablet TAKE 1 TABLET BY MOUTH AT BEDTIME 90 tablet 2  . docusate sodium (COLACE) 100 MG capsule Take 1 capsule (100 mg total) by mouth 2 (two) times daily. 10 capsule 0  . fenofibrate micronized (LOFIBRA) 200 MG capsule TAKE 1 CAPSULE BY MOUTH DAILY BEFORE BREAKFAST 90 capsule 2  . GLIPIZIDE XL 2.5 MG 24 hr tablet Take  2.5 mg by mouth daily with breakfast.   2  . metFORMIN (GLUCOPHAGE) 1000 MG tablet Take 1,000 mg by mouth 2 (two) times daily with a meal.      . multivitamin (THERAGRAN) per tablet Take 1 tablet by mouth daily. Will stop prior to procedure    . vitamin B-12 (CYANOCOBALAMIN) 1000 MCG tablet Take 1,000 mcg by mouth daily. Will stop prior to procedure    . warfarin (COUMADIN) 5 MG tablet TAKE AS DIRECTED BY COUMADIN CLINIC 90 tablet 1   No current facility-administered medications for this visit.      Allergies:  Allergies  Allergen Reactions  . Altace [Ramipril] Other (See Comments)    "throat felt like had a knot in it"  . Codeine Nausea And Vomiting    Nausea and vomiting   . Simvastatin Other (See Comments)    Leg aches    Past Medical History, Surgical history, Social history, and Family History were reviewed and updated.   Physical Exam: Blood pressure (!) 128/59, pulse 86, temperature 98 F (36.7 C), temperature source Oral, resp. rate 18, height 5\' 10"  (1.778 m), weight 221 lb 12.8 oz (100.6 kg), SpO2 95 %. ECOG: 0 General appearance: Well-appearing gentleman appeared without distress. Head: Normocephalic, without obvious abnormality no oral ulcers or lesions. Neck: no adenopathy no thyroid masses. Lymph nodes: Cervical, supraclavicular, and axillary nodes normal. Heart:regular rate and rhythm, S1, S2 normal, no murmur, click, rub or gallop Lung:chest clear, no wheezing, rales, normal symmetric air entry.  Abdomin: soft, non-tender, without masses or  organomegaly no rebound or guarding. EXT:no erythema, induration, or nodules   Lab Results: Lab Results  Component Value Date   WBC 42.7 (H) 11/03/2016   HGB 13.3 11/03/2016   HCT 40.2 11/03/2016   MCV 101.1 (H) 11/03/2016   PLT 279 11/03/2016     Chemistry      Component Value Date/Time   NA 135 05/13/2016 0503   NA 141 05/05/2016 0821   K 4.9 05/13/2016 0503   K 5.5 (H) 05/05/2016 0821   CL 105 05/13/2016  0503   CO2 24 05/13/2016 0503   CO2 22 05/05/2016 0821   BUN 17 05/13/2016 0503   BUN 18.2 05/05/2016 0821   CREATININE 1.08 05/13/2016 0503   CREATININE 1.3 05/05/2016 0821      Component Value Date/Time   CALCIUM 8.6 (L) 05/13/2016 0503   CALCIUM 9.7 05/05/2016 0821   ALKPHOS 67 05/05/2016 0821   AST 31 05/05/2016 0821   ALT 21 05/05/2016 0821   BILITOT 0.75 05/05/2016 0821      Impression and Plan:  82 year old gentleman with:  1. Lymphocytosis: The clinical findings are consistent with CLL. His CT scan results on 04/17/2014 showed very small lymphadenopathy and no indication for treatment.   His CBC was reviewed today and his white cell count not dramatically different in the last 3 years. He has normal hemoglobin and platelet count. Indication for treatment for this condition were reviewed again which include cytopenias, constitutional symptoms or symptomatic lymphadenopathy. He has not entities issues and have recommended continue monitoring for the time being. He understands that if he develops any worsening cytopenias or symptoms treatment will be indicated.  2. Hip pain: He is status post hip surgery with total replacement of his left hip with improvement in his pain.  3. Follow-up: Will be in 6 months.    [facility], MD 5/15/20188:24 AM

## 2016-11-03 NOTE — Telephone Encounter (Signed)
Appointments scheduled per 11/03/16 los. Patient was given a copy of the AVS report and appointment schedule per 11/03/16 los. °

## 2016-11-05 ENCOUNTER — Encounter: Payer: Self-pay | Admitting: Podiatry

## 2016-11-05 ENCOUNTER — Ambulatory Visit (INDEPENDENT_AMBULATORY_CARE_PROVIDER_SITE_OTHER): Payer: Medicare Other | Admitting: Podiatry

## 2016-11-05 DIAGNOSIS — M79671 Pain in right foot: Secondary | ICD-10-CM

## 2016-11-05 DIAGNOSIS — M79672 Pain in left foot: Secondary | ICD-10-CM

## 2016-11-05 DIAGNOSIS — S99921A Unspecified injury of right foot, initial encounter: Secondary | ICD-10-CM

## 2016-11-05 DIAGNOSIS — L608 Other nail disorders: Secondary | ICD-10-CM | POA: Diagnosis not present

## 2016-11-05 NOTE — Progress Notes (Signed)
Subjective: 83 year old male presents complaining of painful and bleeding right great toe. Patient denies any recent injury.   Objective: Thick dystrophic nail with old blood at distal margin of nail plate right great toe. All nails are thick and hypertrophic x 10. No edema, erythema, or associated drainage. Neurovascular status are within normal   Assessment: Injured nail right great toe without infection. Mycotic nails x 10. Painful toes.   Plan: Reviewed clinical findings and available treatment options. Debrided nails. Removed all irregular nail border from right great toe. Betadine dressing applied on right great toe nail bed. Return in 3 month.

## 2016-11-05 NOTE — Patient Instructions (Signed)
Seen for hypertrophic nails and injured right great toe nail. All nails debrided. Right great toe nail cleansed with Iodine and dressing applied.  Return in 3 months or as needed.

## 2016-11-10 ENCOUNTER — Ambulatory Visit (INDEPENDENT_AMBULATORY_CARE_PROVIDER_SITE_OTHER): Payer: Medicare Other

## 2016-11-10 DIAGNOSIS — I4891 Unspecified atrial fibrillation: Secondary | ICD-10-CM | POA: Diagnosis not present

## 2016-11-10 DIAGNOSIS — I4892 Unspecified atrial flutter: Secondary | ICD-10-CM

## 2016-11-10 LAB — POCT INR: INR: 2.5

## 2016-11-19 DIAGNOSIS — J301 Allergic rhinitis due to pollen: Secondary | ICD-10-CM | POA: Diagnosis not present

## 2016-11-19 DIAGNOSIS — H6121 Impacted cerumen, right ear: Secondary | ICD-10-CM | POA: Diagnosis not present

## 2016-12-01 DIAGNOSIS — E119 Type 2 diabetes mellitus without complications: Secondary | ICD-10-CM | POA: Diagnosis not present

## 2016-12-01 DIAGNOSIS — H47322 Drusen of optic disc, left eye: Secondary | ICD-10-CM | POA: Diagnosis not present

## 2016-12-01 DIAGNOSIS — H26493 Other secondary cataract, bilateral: Secondary | ICD-10-CM | POA: Diagnosis not present

## 2016-12-10 ENCOUNTER — Other Ambulatory Visit: Payer: Self-pay | Admitting: Cardiovascular Disease

## 2016-12-28 DIAGNOSIS — M48061 Spinal stenosis, lumbar region without neurogenic claudication: Secondary | ICD-10-CM | POA: Diagnosis not present

## 2016-12-28 DIAGNOSIS — Z96641 Presence of right artificial hip joint: Secondary | ICD-10-CM | POA: Diagnosis not present

## 2016-12-28 DIAGNOSIS — M5136 Other intervertebral disc degeneration, lumbar region: Secondary | ICD-10-CM | POA: Diagnosis not present

## 2016-12-28 DIAGNOSIS — M25551 Pain in right hip: Secondary | ICD-10-CM | POA: Diagnosis not present

## 2016-12-29 ENCOUNTER — Ambulatory Visit (INDEPENDENT_AMBULATORY_CARE_PROVIDER_SITE_OTHER): Payer: Medicare Other | Admitting: *Deleted

## 2016-12-29 DIAGNOSIS — I4891 Unspecified atrial fibrillation: Secondary | ICD-10-CM

## 2016-12-29 DIAGNOSIS — Z96641 Presence of right artificial hip joint: Secondary | ICD-10-CM | POA: Diagnosis not present

## 2016-12-29 DIAGNOSIS — I4892 Unspecified atrial flutter: Secondary | ICD-10-CM

## 2016-12-29 LAB — POCT INR: INR: 3

## 2017-01-11 ENCOUNTER — Ambulatory Visit (INDEPENDENT_AMBULATORY_CARE_PROVIDER_SITE_OTHER): Payer: Medicare Other | Admitting: *Deleted

## 2017-01-11 DIAGNOSIS — I495 Sick sinus syndrome: Secondary | ICD-10-CM | POA: Diagnosis not present

## 2017-01-12 NOTE — Progress Notes (Signed)
Remote pacemaker transmission.   

## 2017-01-13 DIAGNOSIS — Z96641 Presence of right artificial hip joint: Secondary | ICD-10-CM | POA: Diagnosis not present

## 2017-01-13 DIAGNOSIS — M5136 Other intervertebral disc degeneration, lumbar region: Secondary | ICD-10-CM | POA: Diagnosis not present

## 2017-01-13 DIAGNOSIS — M48061 Spinal stenosis, lumbar region without neurogenic claudication: Secondary | ICD-10-CM | POA: Diagnosis not present

## 2017-01-13 DIAGNOSIS — M25551 Pain in right hip: Secondary | ICD-10-CM | POA: Diagnosis not present

## 2017-01-14 ENCOUNTER — Encounter: Payer: Self-pay | Admitting: Cardiology

## 2017-01-27 LAB — CUP PACEART REMOTE DEVICE CHECK
Date Time Interrogation Session: 20180808051509
Implantable Lead Model: 350
Implantable Pulse Generator Implant Date: 20150820
Lead Channel Pacing Threshold Amplitude: 0.6 V
Lead Channel Setting Pacing Amplitude: 2.4 V
MDC IDC LEAD IMPLANT DT: 20150820
MDC IDC LEAD LOCATION: 753860
MDC IDC LEAD SERIAL: 29625633
MDC IDC MSMT LEADCHNL RV IMPEDANCE VALUE: 501 Ohm
MDC IDC MSMT LEADCHNL RV PACING THRESHOLD PULSEWIDTH: 0.4 ms
MDC IDC PG SERIAL: 68372291
MDC IDC SET LEADCHNL RV PACING PULSEWIDTH: 0.4 ms
MDC IDC STAT BRADY RV PERCENT PACED: 93 %

## 2017-01-28 DIAGNOSIS — L570 Actinic keratosis: Secondary | ICD-10-CM | POA: Diagnosis not present

## 2017-01-28 DIAGNOSIS — L0291 Cutaneous abscess, unspecified: Secondary | ICD-10-CM | POA: Diagnosis not present

## 2017-02-02 ENCOUNTER — Ambulatory Visit (INDEPENDENT_AMBULATORY_CARE_PROVIDER_SITE_OTHER): Payer: Medicare Other

## 2017-02-02 DIAGNOSIS — I4891 Unspecified atrial fibrillation: Secondary | ICD-10-CM

## 2017-02-02 DIAGNOSIS — I4892 Unspecified atrial flutter: Secondary | ICD-10-CM | POA: Diagnosis not present

## 2017-02-02 LAB — POCT INR: INR: 2.5

## 2017-02-09 ENCOUNTER — Ambulatory Visit (INDEPENDENT_AMBULATORY_CARE_PROVIDER_SITE_OTHER): Payer: Medicare Other | Admitting: Podiatry

## 2017-02-09 ENCOUNTER — Encounter: Payer: Self-pay | Admitting: Podiatry

## 2017-02-09 DIAGNOSIS — L600 Ingrowing nail: Secondary | ICD-10-CM | POA: Diagnosis not present

## 2017-02-09 DIAGNOSIS — M79672 Pain in left foot: Secondary | ICD-10-CM | POA: Diagnosis not present

## 2017-02-09 DIAGNOSIS — M79671 Pain in right foot: Secondary | ICD-10-CM | POA: Diagnosis not present

## 2017-02-09 DIAGNOSIS — B351 Tinea unguium: Secondary | ICD-10-CM | POA: Diagnosis not present

## 2017-02-09 NOTE — Progress Notes (Signed)
Subjective: 83 year old male presents complaining of painful ingrown nail right great toe.   Objective: Thick dystrophic nail with ingrown nail at medial border right great toe. All nails are thick and hypertrophic x 10. No edema, erythema, or associated drainage. Neurovascular status are within normal   Assessment: Ingrown nail painful right great toe medial border without infection. Mycotic nails x 10. Painful toes.   Plan: Reviewed clinical findings and available treatment options. Debrided nails. Removed all irregular nail border from right great toe. Betadine dressing applied on right great toe nail bed. Return in 3 month.

## 2017-02-09 NOTE — Patient Instructions (Signed)
Seen for painful ingrown right great toe nail and hypertrophic nails. All nails debrided. Right great toe nail cleansed and dressed with Iodine.  Return in 3 months or as needed.

## 2017-03-02 DIAGNOSIS — Z125 Encounter for screening for malignant neoplasm of prostate: Secondary | ICD-10-CM | POA: Diagnosis not present

## 2017-03-02 DIAGNOSIS — E119 Type 2 diabetes mellitus without complications: Secondary | ICD-10-CM | POA: Diagnosis not present

## 2017-03-02 DIAGNOSIS — I10 Essential (primary) hypertension: Secondary | ICD-10-CM | POA: Diagnosis not present

## 2017-03-02 DIAGNOSIS — E784 Other hyperlipidemia: Secondary | ICD-10-CM | POA: Diagnosis not present

## 2017-03-09 DIAGNOSIS — I517 Cardiomegaly: Secondary | ICD-10-CM | POA: Diagnosis not present

## 2017-03-09 DIAGNOSIS — R682 Dry mouth, unspecified: Secondary | ICD-10-CM | POA: Diagnosis not present

## 2017-03-09 DIAGNOSIS — Z23 Encounter for immunization: Secondary | ICD-10-CM | POA: Diagnosis not present

## 2017-03-09 DIAGNOSIS — C9100 Acute lymphoblastic leukemia not having achieved remission: Secondary | ICD-10-CM | POA: Diagnosis not present

## 2017-03-09 DIAGNOSIS — Z950 Presence of cardiac pacemaker: Secondary | ICD-10-CM | POA: Diagnosis not present

## 2017-03-09 DIAGNOSIS — D692 Other nonthrombocytopenic purpura: Secondary | ICD-10-CM | POA: Diagnosis not present

## 2017-03-09 DIAGNOSIS — Z0000 Encounter for general adult medical examination without abnormal findings: Secondary | ICD-10-CM | POA: Diagnosis not present

## 2017-03-09 DIAGNOSIS — Z1389 Encounter for screening for other disorder: Secondary | ICD-10-CM | POA: Diagnosis not present

## 2017-03-09 DIAGNOSIS — I129 Hypertensive chronic kidney disease with stage 1 through stage 4 chronic kidney disease, or unspecified chronic kidney disease: Secondary | ICD-10-CM | POA: Diagnosis not present

## 2017-03-09 DIAGNOSIS — Z7901 Long term (current) use of anticoagulants: Secondary | ICD-10-CM | POA: Diagnosis not present

## 2017-03-09 DIAGNOSIS — Z6830 Body mass index (BMI) 30.0-30.9, adult: Secondary | ICD-10-CM | POA: Diagnosis not present

## 2017-03-09 DIAGNOSIS — E119 Type 2 diabetes mellitus without complications: Secondary | ICD-10-CM | POA: Diagnosis not present

## 2017-03-12 DIAGNOSIS — Z1212 Encounter for screening for malignant neoplasm of rectum: Secondary | ICD-10-CM | POA: Diagnosis not present

## 2017-03-16 ENCOUNTER — Ambulatory Visit (INDEPENDENT_AMBULATORY_CARE_PROVIDER_SITE_OTHER): Payer: Medicare Other | Admitting: *Deleted

## 2017-03-16 DIAGNOSIS — I4891 Unspecified atrial fibrillation: Secondary | ICD-10-CM | POA: Diagnosis not present

## 2017-03-16 DIAGNOSIS — I482 Chronic atrial fibrillation, unspecified: Secondary | ICD-10-CM

## 2017-03-16 DIAGNOSIS — I4892 Unspecified atrial flutter: Secondary | ICD-10-CM | POA: Diagnosis not present

## 2017-03-16 LAB — POCT INR: INR: 2.6

## 2017-03-30 DIAGNOSIS — C61 Malignant neoplasm of prostate: Secondary | ICD-10-CM | POA: Diagnosis not present

## 2017-04-05 ENCOUNTER — Other Ambulatory Visit: Payer: Self-pay | Admitting: Urology

## 2017-04-05 DIAGNOSIS — N5231 Erectile dysfunction following radical prostatectomy: Secondary | ICD-10-CM | POA: Diagnosis not present

## 2017-04-05 DIAGNOSIS — C61 Malignant neoplasm of prostate: Secondary | ICD-10-CM

## 2017-04-05 DIAGNOSIS — R9721 Rising PSA following treatment for malignant neoplasm of prostate: Secondary | ICD-10-CM | POA: Diagnosis not present

## 2017-04-08 DIAGNOSIS — M25561 Pain in right knee: Secondary | ICD-10-CM | POA: Diagnosis not present

## 2017-04-08 DIAGNOSIS — M7041 Prepatellar bursitis, right knee: Secondary | ICD-10-CM | POA: Diagnosis not present

## 2017-04-12 ENCOUNTER — Ambulatory Visit (INDEPENDENT_AMBULATORY_CARE_PROVIDER_SITE_OTHER): Payer: Medicare Other | Admitting: *Deleted

## 2017-04-12 DIAGNOSIS — M25561 Pain in right knee: Secondary | ICD-10-CM | POA: Diagnosis not present

## 2017-04-12 DIAGNOSIS — I495 Sick sinus syndrome: Secondary | ICD-10-CM | POA: Diagnosis not present

## 2017-04-12 DIAGNOSIS — M7041 Prepatellar bursitis, right knee: Secondary | ICD-10-CM | POA: Diagnosis not present

## 2017-04-12 NOTE — Progress Notes (Signed)
Remote pacemaker transmission.   

## 2017-04-13 ENCOUNTER — Encounter (HOSPITAL_COMMUNITY)
Admission: RE | Admit: 2017-04-13 | Discharge: 2017-04-13 | Disposition: A | Discharge status: Home / Self Care | Payer: Medicare Other | Source: Ambulatory Visit | Attending: Urology | Admitting: Urology

## 2017-04-13 DIAGNOSIS — C61 Malignant neoplasm of prostate: Secondary | ICD-10-CM | POA: Diagnosis not present

## 2017-04-13 DIAGNOSIS — S8991XA Unspecified injury of right lower leg, initial encounter: Secondary | ICD-10-CM | POA: Diagnosis not present

## 2017-04-13 MED ORDER — TECHNETIUM TC 99M MEDRONATE IV KIT
19.3000 | PACK | Freq: Once | INTRAVENOUS | Status: AC | PRN
  Administered 2017-04-13: 19.3 via INTRAVENOUS

## 2017-04-14 LAB — CUP PACEART REMOTE DEVICE CHECK
Date Time Interrogation Session: 20181024164722
Implantable Lead Location: 753860
Implantable Lead Serial Number: 29625633
MDC IDC LEAD IMPLANT DT: 20150820
MDC IDC PG IMPLANT DT: 20150820
Pulse Gen Model: 394934
Pulse Gen Serial Number: 68372291

## 2017-04-16 ENCOUNTER — Encounter: Payer: Self-pay | Admitting: Cardiology

## 2017-04-20 DIAGNOSIS — C61 Malignant neoplasm of prostate: Secondary | ICD-10-CM | POA: Diagnosis not present

## 2017-04-21 ENCOUNTER — Ambulatory Visit (INDEPENDENT_AMBULATORY_CARE_PROVIDER_SITE_OTHER): Payer: Medicare Other | Admitting: *Deleted

## 2017-04-21 ENCOUNTER — Ambulatory Visit (INDEPENDENT_AMBULATORY_CARE_PROVIDER_SITE_OTHER): Payer: Medicare Other | Admitting: Internal Medicine

## 2017-04-21 ENCOUNTER — Encounter: Payer: Self-pay | Admitting: Internal Medicine

## 2017-04-21 VITALS — BP 140/80 | HR 72 | Ht 70.0 in | Wt 216.0 lb

## 2017-04-21 DIAGNOSIS — I4891 Unspecified atrial fibrillation: Secondary | ICD-10-CM

## 2017-04-21 DIAGNOSIS — I495 Sick sinus syndrome: Secondary | ICD-10-CM | POA: Diagnosis not present

## 2017-04-21 DIAGNOSIS — I4892 Unspecified atrial flutter: Secondary | ICD-10-CM

## 2017-04-21 LAB — POCT INR: INR: 2.3

## 2017-04-21 NOTE — Patient Instructions (Signed)
Medication Instructions:  Your physician recommends that you continue on your current medications as directed. Please refer to the Current Medication list given to you today.  Labwork: None ordered.  Testing/Procedures: None ordered.  Follow-Up: Your physician wants you to follow-up in: one year with Dr. Lovena Le.   You will receive a reminder letter in the mail two months in advance. If you don't receive a letter, please call our office to schedule the follow-up appointment.  Remote monitoring is used to monitor your Pacemaker from home. This monitoring reduces the number of office visits required to check your device to one time per year. It allows [place] to keep an eye on the functioning of your device to ensure it is working properly. You are scheduled for a device check from home on 07/12/2016. You may send your transmission at any time that day. If you have a wireless device, the transmission will be sent automatically. After your physician reviews your transmission, you will receive a postcard with your next transmission date.     Any Other Special Instructions Will Be Listed Below (If Applicable).     If you need a refill on your cardiac medications before your next appointment, please call your pharmacy.

## 2017-04-21 NOTE — Progress Notes (Signed)
HPI [name] returns today for ongoing evaluation and management of atrial fibrillation and symptomatic bradycardia status post pacemaker insertion. In the interim, he has undergone hip replacement surgery which was uncomplicated, and has been diagnosed with recurrent prostate cancer. He has undergone hormone therapy for this. He denies chest pain or shortness of breath. He does have some swelling in his right leg. Allergies  Allergen Reactions  . Altace [Ramipril] Other (See Comments)    "throat felt like had a knot in it"  . Codeine Nausea And Vomiting    Nausea and vomiting   . Simvastatin Other (See Comments)    Leg aches     Current Outpatient Prescriptions  Medication Sig Dispense Refill  . amLODipine (NORVASC) 10 MG tablet Take 10 mg by mouth at bedtime.   2  . aspirin 81 MG tablet Take 81 mg by mouth at bedtime. Will stop prior to surgery    . atorvastatin (LIPITOR) 20 MG tablet TAKE 1 TABLET BY MOUTH AT BEDTIME 90 tablet 2  . docusate sodium (COLACE) 100 MG capsule Take 1 capsule (100 mg total) by mouth 2 (two) times daily. 10 capsule 0  . fenofibrate micronized (LOFIBRA) 200 MG capsule TAKE 1 CAPSULE BY MOUTH DAILY BEFORE BREAKFAST 90 capsule 2  . JANUVIA 100 MG tablet Take 100 mg by mouth at bedtime.   2  . metFORMIN (GLUCOPHAGE) 1000 MG tablet Take 1,000 mg by mouth 2 (two) times daily with a meal.      . multivitamin (THERAGRAN) per tablet Take 1 tablet by mouth daily. Will stop prior to procedure    . vitamin B-12 (CYANOCOBALAMIN) 1000 MCG tablet Take 1,000 mcg by mouth daily. Will stop prior to procedure    . warfarin (COUMADIN) 5 MG tablet Take as diected by [facility] 90 tablet 1   No current facility-administered medications for this visit.      Past Medical History:  Diagnosis Date  . CLL (chronic lymphocytic leukemia) ([place])   . Diabetes mellitus type 2, noninsulin dependent ([place])   . Hyperlipidemia   . Hypertension   . Pacemaker 02/08/2014   DR Lovena Le  . prostate ca dx'd 2006   xrt; prostatectomy  . Sick sinus syndrome ([facility])    a-Flutter with episodes of bradycardia; S/P Biotronik (serial number [id number])    ROS:   All systems reviewed and negative except as noted in the HPI.   Past Surgical History:  Procedure Laterality Date  . APPENDECTOMY    . BACK SURGERY     disk  . INSERT / REPLACE / REMOVE PACEMAKER  02/08/2014    DR Lovena Le  . PACEMAKER INSERTION  02/08/2014   Biotronik (serial number [id number])  . PERMANENT PACEMAKER INSERTION N/A 02/08/2014   Procedure: PERMANENT PACEMAKER INSERTION;  Surgeon: Evans Lance, MD;  Location: [facility] CATH LAB;  Service: Cardiovascular;  Laterality: N/A;  . TONSILLECTOMY    . TOTAL HIP ARTHROPLASTY Left 05/12/2016   Procedure: LEFT TOTAL HIP ARTHROPLASTY ANTERIOR APPROACH;  Surgeon: Paralee Cancel, MD;  Location: [facility] ORS;  Service: Orthopedics;  Laterality: Left;  . TOTAL KNEE ARTHROPLASTY       Family History  Problem Relation Age of Onset  . Emphysema Mother   . Allergic rhinitis Mother   . Heart attack Father   . Allergic rhinitis Brother   . Pulmonary fibrosis Brother      Social History   Social History  . Marital status: Married    Spouse  name: N/A  . Number of children: N/A  . Years of education: N/A   Occupational History  . Not on file.   Social History Main Topics  . Smoking status: Former Smoker    Years: 25.00    Types: Pipe, Cigars    Quit date: 11/25/1975  . Smokeless tobacco: Never Used  . Alcohol use 0.0 oz/week     Comment: occasional  . Drug use: No  . Sexual activity: Not Currently   Other Topics Concern  . Not on file   Social History Narrative  . No narrative on file     BP 140/80   Pulse 72   Ht 5\' 10"  (1.778 m)   Wt 216 lb (98 kg)   BMI 30.99 kg/m   Physical Exam:  Well appearing 83 year old man, NAD HEENT: Unremarkable Neck:  7 cm JVD, no thyromegally Lymphatics:  No adenopathy Back:  No CVA tenderness Lungs:  Clear,  with no wheezes, rales, or rhonchi HEART:  IRegular rate rhythm, no murmurs, no rubs, no clicks Abd:  soft, positive bowel sounds, no organomegally, no rebound, no guarding Ext:  2 plus pulses, no edema, no cyanosis, no clubbing Skin:  No rashes no nodules Neuro:  CN II through XII intact, motor grossly intact   DEVICE  Normal device function.  See PaceArt for details.   Assess/Plan: 1. Chronic atrial fibrillation - his ventricular rate is well controlled. No change in medical therapy. 2. Pacemaker - his Biotronik single chamber pacemaker is working normally. We'll recheck in several months. 3. Hypertension - his blood pressure today is minimally elevated. He is encouraged to maintain a low-sodium diet. 4. Dyslipidemia - he will continue his statin therapy.  Cristopher Peru, M.D.

## 2017-04-22 DIAGNOSIS — R319 Hematuria, unspecified: Secondary | ICD-10-CM

## 2017-04-22 HISTORY — DX: Hematuria, unspecified: R31.9

## 2017-04-27 DIAGNOSIS — M25561 Pain in right knee: Secondary | ICD-10-CM | POA: Diagnosis not present

## 2017-04-27 DIAGNOSIS — M7041 Prepatellar bursitis, right knee: Secondary | ICD-10-CM | POA: Diagnosis not present

## 2017-04-30 LAB — CUP PACEART INCLINIC DEVICE CHECK
Date Time Interrogation Session: 20181031124100
Implantable Lead Location: 753860
Implantable Lead Model: 350
Implantable Lead Serial Number: 29625633
Implantable Pulse Generator Implant Date: 20150820
Lead Channel Impedance Value: 507 Ohm
Lead Channel Sensing Intrinsic Amplitude: 8.3 mV
Lead Channel Setting Pacing Amplitude: 2.4 V
Lead Channel Setting Pacing Pulse Width: 0.4 ms
MDC IDC LEAD IMPLANT DT: 20150820
MDC IDC MSMT LEADCHNL RV PACING THRESHOLD AMPLITUDE: 0.8 V
MDC IDC MSMT LEADCHNL RV PACING THRESHOLD PULSEWIDTH: 0.4 ms
MDC IDC PG SERIAL: 68372291

## 2017-05-02 DIAGNOSIS — R319 Hematuria, unspecified: Secondary | ICD-10-CM | POA: Diagnosis not present

## 2017-05-02 DIAGNOSIS — R339 Retention of urine, unspecified: Secondary | ICD-10-CM | POA: Diagnosis not present

## 2017-05-02 DIAGNOSIS — I10 Essential (primary) hypertension: Secondary | ICD-10-CM | POA: Diagnosis not present

## 2017-05-02 DIAGNOSIS — T83021A Displacement of indwelling urethral catheter, initial encounter: Secondary | ICD-10-CM | POA: Diagnosis not present

## 2017-05-02 DIAGNOSIS — Z87891 Personal history of nicotine dependence: Secondary | ICD-10-CM | POA: Diagnosis not present

## 2017-05-02 DIAGNOSIS — I4891 Unspecified atrial fibrillation: Secondary | ICD-10-CM | POA: Diagnosis not present

## 2017-05-02 DIAGNOSIS — E119 Type 2 diabetes mellitus without complications: Secondary | ICD-10-CM | POA: Diagnosis not present

## 2017-05-03 DIAGNOSIS — R310 Gross hematuria: Secondary | ICD-10-CM | POA: Diagnosis not present

## 2017-05-04 ENCOUNTER — Other Ambulatory Visit: Payer: Medicare Other

## 2017-05-04 ENCOUNTER — Ambulatory Visit: Payer: Medicare Other | Admitting: Oncology

## 2017-05-04 ENCOUNTER — Telehealth: Payer: Self-pay | Admitting: *Deleted

## 2017-05-04 NOTE — Telephone Encounter (Signed)
Pt called to inform [facility] that he was at the [facility] on Sunday night and he saw a doctor & was told he had blood in his urine, therefore the doctor stopped his Warfarin until tomorrow & that he will start Warfarin back tomorrow night. Also, he states he took Doxycycline 100mg  for 2 doses only, while off his Coumadin. Also, he is taking Uribel daily. Made pt aware that Doxycycline can interfere with Coumadin & he verbalized understanding, however, he was off his Coumadin & restarting tomorrow. Pt advised to call back with any new medications & he verbalized understanding.

## 2017-05-07 DIAGNOSIS — R310 Gross hematuria: Secondary | ICD-10-CM | POA: Diagnosis not present

## 2017-05-09 ENCOUNTER — Emergency Department (HOSPITAL_COMMUNITY)
Admission: EM | Admit: 2017-05-09 | Discharge: 2017-05-09 | Disposition: A | Discharge status: Home / Self Care | Payer: Medicare Other | Attending: Emergency Medicine | Admitting: Emergency Medicine

## 2017-05-09 ENCOUNTER — Encounter (HOSPITAL_COMMUNITY): Payer: Self-pay

## 2017-05-09 ENCOUNTER — Other Ambulatory Visit: Payer: Self-pay

## 2017-05-09 DIAGNOSIS — Z8546 Personal history of malignant neoplasm of prostate: Secondary | ICD-10-CM | POA: Insufficient documentation

## 2017-05-09 DIAGNOSIS — R339 Retention of urine, unspecified: Secondary | ICD-10-CM | POA: Insufficient documentation

## 2017-05-09 DIAGNOSIS — E119 Type 2 diabetes mellitus without complications: Secondary | ICD-10-CM | POA: Diagnosis not present

## 2017-05-09 DIAGNOSIS — I4891 Unspecified atrial fibrillation: Secondary | ICD-10-CM | POA: Insufficient documentation

## 2017-05-09 DIAGNOSIS — Z7984 Long term (current) use of oral hypoglycemic drugs: Secondary | ICD-10-CM | POA: Diagnosis not present

## 2017-05-09 DIAGNOSIS — Z95811 Presence of heart assist device: Secondary | ICD-10-CM | POA: Insufficient documentation

## 2017-05-09 DIAGNOSIS — Z79899 Other long term (current) drug therapy: Secondary | ICD-10-CM | POA: Insufficient documentation

## 2017-05-09 DIAGNOSIS — I10 Essential (primary) hypertension: Secondary | ICD-10-CM | POA: Insufficient documentation

## 2017-05-09 DIAGNOSIS — Z7982 Long term (current) use of aspirin: Secondary | ICD-10-CM | POA: Insufficient documentation

## 2017-05-09 DIAGNOSIS — Z87891 Personal history of nicotine dependence: Secondary | ICD-10-CM | POA: Diagnosis not present

## 2017-05-09 DIAGNOSIS — Z7901 Long term (current) use of anticoagulants: Secondary | ICD-10-CM | POA: Diagnosis not present

## 2017-05-09 DIAGNOSIS — Z856 Personal history of leukemia: Secondary | ICD-10-CM | POA: Diagnosis not present

## 2017-05-09 LAB — URINALYSIS, ROUTINE W REFLEX MICROSCOPIC
BACTERIA UA: NONE SEEN
BILIRUBIN URINE: NEGATIVE
Glucose, UA: NEGATIVE mg/dL
Ketones, ur: NEGATIVE mg/dL
NITRITE: NEGATIVE
PH: 7 (ref 5.0–8.0)
Protein, ur: NEGATIVE mg/dL
SPECIFIC GRAVITY, URINE: 1.006 (ref 1.005–1.030)
Squamous Epithelial / LPF: NONE SEEN

## 2017-05-09 MED ORDER — TAMSULOSIN HCL 0.4 MG PO CAPS: ORAL_CAPSULE | ORAL | 0 refills | Status: DC

## 2017-05-09 NOTE — ED Triage Notes (Addendum)
Pt states that last Sunday, he started experiencing hematuria. He had a catheter removed Friday morning and states that he hasn't had a good urine stream since then. He reports that he is uncontrollably leaking urine, but cannot pee. Endorses pain in his penis, urethra and bladder. A&Ox4. Ambulatory. Hx prostate cancer

## 2017-05-09 NOTE — Discharge Instructions (Signed)
Start taking the Flomax prescription this evening. It may tend to make you dizzy so be careful when you stand up.

## 2017-05-09 NOTE — ED Provider Notes (Signed)
[place] DEPT Provider Note   CSN: [id number] Arrival date & time: 05/09/17  1225     History   Chief Complaint Chief Complaint  Patient presents with  . Urinary Retention    HPI Oscar Baker is a 83 y.o. male.  He presents for evaluation of hematuria and urinary retention.  He has had some dribbling.  He recently had Foley catheterization which was removed several days ago.  He began to have problems similar to this about 10 days ago when he had blood in his urine.  He states that his urologist recently started him on treatment for recurrent prostate cancer.  He denies fever, chills, nausea, vomiting, weakness or dizziness.  There are no other known modifying factors.  HPI  Past Medical History:  Diagnosis Date  . CLL (chronic lymphocytic leukemia) ([place])   . Diabetes mellitus type 2, noninsulin dependent ([place])   . Hyperlipidemia   . Hypertension   . Pacemaker 02/08/2014   DR Lovena Le  . prostate ca dx'd 2006   xrt; prostatectomy  . Sick sinus syndrome [facility])    a-Flutter with episodes of bradycardia; S/P Biotronik (serial number [id number])    Patient Active Problem List   Diagnosis Date Noted  . S/P left THA, AA 05/12/2016  . LPRD (laryngopharyngeal reflux disease) 05/21/2015  . Other allergic rhinitis 05/21/2015  . Obesity 12/12/2014  . Upper airway cough syndrome 12/11/2014  . Cough variant asthma 07/27/2014  . Wheezing 06/20/2014  . Symptomatic bradycardia - s/p Biotronik (serial number [id number]) 02/08/2014  . Diabetes mellitus type 2, noninsulin dependent ([place])   . Edema of foot 03/06/2013  . Traumatic ecchymosis of right foot 03/06/2013  . Bone spur 12/30/2012  . Pain of toe of left foot 12/30/2012  . Hyperlipidemia 07/13/2012  . HTN (hypertension) 04/12/2012  . Sick sinus syndrome ([place]) 08/17/2011  . Atrial fibrillation ([place]) 09/16/2010    Past Surgical History:  Procedure Laterality Date  . APPENDECTOMY    . BACK  SURGERY     disk  . INSERT / REPLACE / REMOVE PACEMAKER  02/08/2014    DR Lovena Le  . LEFT TOTAL HIP ARTHROPLASTY ANTERIOR APPROACH Left 05/12/2016   Performed by Paralee Cancel, MD at [facility] ORS  . PACEMAKER INSERTION  02/08/2014   Biotronik (serial number [id number])  . PERMANENT PACEMAKER INSERTION N/A 02/08/2014   Performed by Evans Lance, MD at [facility] CATH LAB  . TONSILLECTOMY    . TOTAL KNEE ARTHROPLASTY         Home Medications    Prior to Admission medications   Medication Sig Start Date End Date Taking? Authorizing Provider  amLODipine (NORVASC) 10 MG tablet Take 10 mg by mouth at bedtime.  04/28/14  Yes [provider]  aspirin 81 MG tablet Take 81 mg by mouth at bedtime. Will stop prior to surgery   Yes [provider]  atorvastatin (LIPITOR) 20 MG tablet TAKE 1 TABLET BY MOUTH AT BEDTIME 08/04/16  Yes Nahser, Wonda Cheng, MD  docusate sodium (COLACE) 100 MG capsule Take 1 capsule (100 mg total) by mouth 2 (two) times daily. 05/13/16  Yes Babish, Rodman Key, PA-C  doxycycline (VIBRAMYCIN) 100 MG capsule Take 100 mg 2 (two) times daily by mouth.   Yes [provider]  fenofibrate micronized (LOFIBRA) 200 MG capsule TAKE 1 CAPSULE BY MOUTH DAILY BEFORE BREAKFAST 09/07/16  Yes Nahser, Wonda Cheng, MD  JANUVIA 100 MG tablet Take 100 mg by mouth at bedtime.  02/27/17  Yes [provider]  metFORMIN (GLUCOPHAGE) 1000 MG tablet Take 1,000 mg by mouth 2 (two) times daily with a meal.     Yes [provider]  Meth-Hyo-M Bl-Na Phos-Ph Sal (URIBEL) 118 MG CAPS Take 118 mg daily as needed by mouth (painful urination).   Yes [provider]  multivitamin [facility]) per tablet Take 1 tablet by mouth daily. Will stop prior to procedure   Yes [provider]  vitamin B-12 (CYANOCOBALAMIN) 1000 MCG tablet Take 1,000 mcg by mouth daily. Will stop prior to procedure   Yes [provider]  warfarin (COUMADIN) 5 MG tablet Take as diected by  [facility] 12/10/16  Yes Nahser, Wonda Cheng, MD  tamsulosin [facility]) 0.4 MG CAPS capsule 1 q HS 05/09/17   Daleen Bo, MD    Family History Family History  Problem Relation Age of Onset  . Emphysema Mother   . Allergic rhinitis Mother   . Heart attack Father   . Allergic rhinitis Brother   . Pulmonary fibrosis Brother     Social History Social History   Tobacco Use  . Smoking status: Former Smoker    Years: 25.00    Types: Pipe, Cigars    Last attempt to quit: 11/25/1975    Years since quitting: 41.4  . Smokeless tobacco: Never Used  Substance Use Topics  . Alcohol use: Yes    Alcohol/week: 0.0 oz    Comment: occasional  . Drug use: No     Allergies   Altace [ramipril]; Codeine; and Simvastatin   Review of Systems Review of Systems  All other systems reviewed and are negative.    Physical Exam Updated Vital Signs BP 121/76   Pulse 68   Temp 98 F (36.7 C) (Oral)   Resp 18   SpO2 97%   Physical Exam  Constitutional: He is oriented to person, place, and time. He appears well-developed and well-nourished.  HENT:  Head: Normocephalic and atraumatic.  Right Ear: External ear normal.  Left Ear: External ear normal.  Eyes: Conjunctivae and EOM are normal. Pupils are equal, round, and reactive to light.  Neck: Normal range of motion and phonation normal. Neck supple.  Cardiovascular: Normal rate.  Pulmonary/Chest: Effort normal. He exhibits no bony tenderness.  Abdominal: There is no tenderness.  Musculoskeletal: Normal range of motion.  Neurological: He is alert and oriented to person, place, and time. No cranial nerve deficit or sensory deficit. He exhibits normal muscle tone. Coordination normal.  Skin: Skin is warm, dry and intact.  Psychiatric: He has a normal mood and affect. His behavior is normal. Judgment and thought content normal.  Nursing note and vitals reviewed.    ED Treatments / Results  Labs (all labs ordered are listed, but only  abnormal results are displayed) Labs Reviewed  URINALYSIS, ROUTINE W REFLEX MICROSCOPIC - Abnormal; Notable for the following components:      Result Value   Color, Urine STRAW (*)    Hgb urine dipstick MODERATE (*)    Leukocytes, UA TRACE (*)    All other components within normal limits    EKG  EKG Interpretation None       Radiology No results found.  Procedures Procedures (including critical care time)  Medications Ordered in ED Medications - No data to display   Initial Impression / Assessment and Plan / ED Course  I have reviewed the triage vital signs and the nursing notes.  Pertinent labs & imaging results that were available during  my care of the patient were reviewed by me and considered in my medical decision making (see chart for details).  Clinical Course as of May 09 1618  Sun May 09, 2017  1618 No apparent infection Bacteria, UA: NONE SEEN [EW]    Clinical Course User Index [EW] Daleen Bo, MD     Patient Vitals for the past 24 hrs:  BP Temp Temp src Pulse Resp SpO2  05/09/17 1607 121/76 - - 68 18 97 %  05/09/17 1311 140/66 98 F (36.7 C) Oral 69 17 97 %    4:17 PM Reevaluation with update and discussion. After initial assessment and treatment, an updated evaluation reveals no change in clinical status.  Findings discussed with the patient and all questions were answered. Daleen Bo      Final Clinical Impressions(s) / ED Diagnoses   Final diagnoses:  Urinary retention   Recurrent urinary retention, cause not clear, but likely related to recent diagnosis of prostate cancer, recurrent.  Patient improved after placement of Foley catheter.  No apparent urinary tract infection.   Nursing Notes Reviewed/ Care Coordinated Applicable Imaging Reviewed Interpretation of Laboratory Data incorporated into ED treatment  The patient appears reasonably screened and/or stabilized for discharge and I doubt any other medical condition or other [facility]  requiring further screening, evaluation, or treatment in the ED at this time prior to discharge.  Plan: Home Medications-continue usual medications; Home Treatments-rest, normal diet; return here if the recommended treatment, does not improve the symptoms; Recommended follow up- Urology asap  ED Discharge Orders        Ordered    tamsulosin (FLOMAX) 0.4 MG CAPS capsule     05/09/17 1614       Daleen Bo, MD 05/09/17 [phone]

## 2017-05-10 DIAGNOSIS — N3942 Incontinence without sensory awareness: Secondary | ICD-10-CM | POA: Diagnosis not present

## 2017-05-10 DIAGNOSIS — R3914 Feeling of incomplete bladder emptying: Secondary | ICD-10-CM | POA: Diagnosis not present

## 2017-05-11 ENCOUNTER — Telehealth: Payer: Self-pay | Admitting: Oncology

## 2017-05-11 ENCOUNTER — Ambulatory Visit (HOSPITAL_BASED_OUTPATIENT_CLINIC_OR_DEPARTMENT_OTHER): Payer: Medicare Other | Admitting: Oncology

## 2017-05-11 ENCOUNTER — Other Ambulatory Visit (HOSPITAL_BASED_OUTPATIENT_CLINIC_OR_DEPARTMENT_OTHER): Payer: Medicare Other

## 2017-05-11 ENCOUNTER — Other Ambulatory Visit: Payer: Self-pay | Admitting: Cardiovascular Disease

## 2017-05-11 VITALS — BP 135/66 | HR 73 | Temp 97.8°F | Resp 18 | Ht 70.0 in | Wt 211.9 lb

## 2017-05-11 DIAGNOSIS — C9110 Chronic lymphocytic leukemia of B-cell type not having achieved remission: Secondary | ICD-10-CM

## 2017-05-11 DIAGNOSIS — C9190 Lymphoid leukemia, unspecified not having achieved remission: Secondary | ICD-10-CM

## 2017-05-11 LAB — COMPREHENSIVE METABOLIC PANEL
ALBUMIN: 3.8 g/dL (ref 3.5–5.0)
ALT: 19 U/L (ref 0–55)
AST: 26 U/L (ref 5–34)
Alkaline Phosphatase: 65 U/L (ref 40–150)
Anion Gap: 9 mEq/L (ref 3–11)
BILIRUBIN TOTAL: 0.75 mg/dL (ref 0.20–1.20)
BUN: 21.1 mg/dL (ref 7.0–26.0)
CHLORIDE: 107 meq/L (ref 98–109)
CO2: 23 meq/L (ref 22–29)
CREATININE: 1 mg/dL (ref 0.7–1.3)
Calcium: 9 mg/dL (ref 8.4–10.4)
EGFR: >60 mL/min/{1.73_m2} (ref >60)
GLUCOSE: 180 mg/dL — AB (ref 70–140)
Potassium: 4.6 mEq/L (ref 3.5–5.1)
Sodium: 139 mEq/L (ref 136–145)
TOTAL PROTEIN: 6.8 g/dL (ref 6.4–8.3)

## 2017-05-11 LAB — CBC WITH DIFFERENTIAL/PLATELET
BASO%: 0.6 % (ref 0.0–2.0)
BASOS ABS: 0.3 10*3/uL — AB (ref 0.0–0.1)
EOS ABS: 0.2 10*3/uL (ref 0.0–0.5)
EOS%: 0.5 % (ref 0.0–7.0)
HCT: 36.9 % — ABNORMAL LOW (ref 38.4–49.9)
HEMOGLOBIN: 12 g/dL — AB (ref 13.0–17.1)
LYMPH%: 81.6 % — AB (ref 14.0–49.0)
MCH: 33.9 pg — AB (ref 27.2–33.4)
MCHC: 32.6 g/dL (ref 32.0–36.0)
MCV: 103.9 fL — AB (ref 79.3–98.0)
MONO#: 0.1 10*3/uL (ref 0.1–0.9)
MONO%: 0.3 % (ref 0.0–14.0)
NEUT%: 17 % — ABNORMAL LOW (ref 39.0–75.0)
NEUTROS ABS: 7.3 10*3/uL — AB (ref 1.5–6.5)
Platelets: 291 10*3/uL (ref 140–400)
RBC: 3.55 10*6/uL — AB (ref 4.20–5.82)
RDW: 19.9 % — ABNORMAL HIGH (ref 11.0–14.6)
WBC: 42.7 10*3/uL — AB (ref 4.0–10.3)
lymph#: 34.9 10*3/uL — ABNORMAL HIGH (ref 0.9–3.3)

## 2017-05-11 LAB — TECHNOLOGIST REVIEW

## 2017-05-11 NOTE — Telephone Encounter (Signed)
Patient declined avs and calendar  °

## 2017-05-11 NOTE — Progress Notes (Signed)
Hematology and Oncology Follow Up Visit  Oscar Baker [id number] [birth year] 83 y.o. 05/11/2017 8:55 AM Oscar Baker, MDRusso, Oscar Reichmann, MD   Principle Diagnosis: 83 year old gentleman with:  1. CLL diagnosed in August 2015. He presented with leukocytosis and mild lymphadenopathy.  2.  Prostate cancer diagnosed in 2004.  He underwent a prostatectomy and the final pathology revealed a T3BN0 Gleason score 8.  He received adjuvant radiation therapy.  He developed biochemical relapse in 2018 with a PSA of 3.  Current therapy: Observation and surveillance for CLL.  He was started on androgen deprivation therapy for his prostate cancer after recent PSA rise  Interim History:  Oscar Baker presents today for a follow-up visit. Since the last visit, he was seen in the emergency department last week for urinary retention.  Foley catheter was placed and currently follows with Dr. Diona Fanti regarding this issue and his prostate cancer.  He reports his PSA has increased up to 3 and was started on androgen deprivation therapy. He does not report any constitutional symptoms of fevers or chills or sweats. He does not report any painful adenopathy or change in his performance status. He reports that his appetite is excellent and his weight is stable.  He continues to attend to activities of daily living without any decline.  He does not report any headaches or blurry vision or syncope. He does not report any chest pain, palpitation or shortness of breath. He does not report any nausea, vomiting, abdominal pain. He does not report any frequency urgency or hesitancy. He does not report any skeletal complaints. Rest of his review of systems unremarkable.  Medications: I have reviewed the patient's current medications.  Current Outpatient Medications  Medication Sig Dispense Refill  . amLODipine (NORVASC) 10 MG tablet Take 10 mg by mouth at bedtime.   2  . aspirin 81 MG tablet Take 81 mg by mouth at bedtime. Will stop  prior to surgery    . atorvastatin (LIPITOR) 20 MG tablet TAKE 1 TABLET BY MOUTH AT BEDTIME 90 tablet 2  . docusate sodium (COLACE) 100 MG capsule Take 1 capsule (100 mg total) by mouth 2 (two) times daily. 10 capsule 0  . doxycycline (VIBRAMYCIN) 100 MG capsule Take 100 mg 2 (two) times daily by mouth.    . fenofibrate micronized (LOFIBRA) 200 MG capsule TAKE 1 CAPSULE BY MOUTH DAILY BEFORE BREAKFAST 90 capsule 2  . JANUVIA 100 MG tablet Take 100 mg by mouth at bedtime.   2  . metFORMIN (GLUCOPHAGE) 1000 MG tablet Take 1,000 mg by mouth 2 (two) times daily with a meal.      . Meth-Hyo-M Bl-Na Phos-Ph Sal (URIBEL) 118 MG CAPS Take 118 mg daily as needed by mouth (painful urination).    . multivitamin (THERAGRAN) per tablet Take 1 tablet by mouth daily. Will stop prior to procedure    . tamsulosin (FLOMAX) 0.4 MG CAPS capsule 1 q HS 30 capsule 0  . vitamin B-12 (CYANOCOBALAMIN) 1000 MCG tablet Take 1,000 mcg by mouth daily. Will stop prior to procedure    . warfarin (COUMADIN) 5 MG tablet Take as diected by [facility] 90 tablet 1   No current facility-administered medications for this visit.      Allergies:  Allergies  Allergen Reactions  . Altace [Ramipril] Other (See Comments)    "throat felt like had a knot in it"  . Codeine Nausea And Vomiting    Nausea and vomiting   . Simvastatin Other (See Comments)  Leg aches    Past Medical History, Surgical history, Social history, and Family History were reviewed and updated.   Physical Exam: Blood pressure 135/66, pulse 73, temperature 97.8 F (36.6 C), temperature source Oral, resp. rate 18, height 5\' 10"  (1.778 m), weight 211 lb 14.4 oz (96.1 kg), SpO2 100 %. ECOG: 0 General appearance: Alert, awake gentleman without distress. Head: Normocephalic, without obvious abnormality no oral ulcers or thrush. Neck: no adenopathy no thyroid masses. Lymph nodes: Cervical, supraclavicular, and axillary nodes normal. Heart:regular rate  and rhythm, S1, S2 normal, no murmur, click, rub or gallop Lung:chest clear, no wheezing, rales, normal symmetric air entry.  Abdomin: soft, non-tender, without masses or organomegaly no pitting dullness or ascites. EXT:no erythema, induration, or nodules   Lab Results: Lab Results  Component Value Date   WBC 42.7 (H) 05/11/2017   HGB 12.0 (L) 05/11/2017   HCT 36.9 (L) 05/11/2017   MCV 103.9 (H) 05/11/2017   PLT 291 05/11/2017     Chemistry      Component Value Date/Time   NA 142 11/03/2016 0735   K 4.8 11/03/2016 0735   CL 105 05/13/2016 0503   CO2 23 11/03/2016 0735   BUN 20.2 11/03/2016 0735   CREATININE 1.3 11/03/2016 0735      Component Value Date/Time   CALCIUM 9.4 11/03/2016 0735   ALKPHOS 60 11/03/2016 0735   AST 33 11/03/2016 0735   ALT 22 11/03/2016 0735   BILITOT 0.59 11/03/2016 0735      Impression and Plan:  83 year old gentleman with:  1. Lymphocytosis: The clinical findings are consistent with CLL. His CT scan results on 04/17/2014 showed very small lymphadenopathy and no indication for treatment.   His CBC was reviewed today and his white cell count remained stable in the last year.  He has no indication to start treatment regarding this condition.  I recommended continued observation and surveillance and consider starting treatment if he develops worsening cytopenias or symptomatic lymphadenopathy.  2.  Prostate cancer: His PSA started to rise slowly and currently receiving androgen deprivation therapy under the care of Dr. Diona Fanti.  He has had urinary retention and has a Foley catheter in place.  No additional therapy is needed from medical oncology standpoint.  3. Follow-up: Will be in 6 months.    Oscar Button, MD 11/20/20188:55 AM

## 2017-05-12 ENCOUNTER — Ambulatory Visit (INDEPENDENT_AMBULATORY_CARE_PROVIDER_SITE_OTHER): Payer: Medicare Other | Admitting: Podiatry

## 2017-05-12 ENCOUNTER — Encounter: Payer: Self-pay | Admitting: Podiatry

## 2017-05-12 DIAGNOSIS — L600 Ingrowing nail: Secondary | ICD-10-CM

## 2017-05-12 DIAGNOSIS — M79671 Pain in right foot: Secondary | ICD-10-CM | POA: Diagnosis not present

## 2017-05-12 DIAGNOSIS — M79672 Pain in left foot: Secondary | ICD-10-CM

## 2017-05-12 DIAGNOSIS — B351 Tinea unguium: Secondary | ICD-10-CM

## 2017-05-12 NOTE — Progress Notes (Signed)
Subjective: 83 y.o. year old male patient presents complaining of painful nails. Patient requests toe nails trimmed.   Objective: Dermatologic: Thick yellow deformed nails x 10. Ingrown hallucal nails both great toes. Vascular: Pedal pulses are all palpable. Orthopedic: No growth deformities. Neurologic: All epicritic and tactile sensations grossly intact.  Assessment: Dystrophic mycotic nails x 10. Painful ingrown nails both great toes.  Treatment: All mycotic nails debrided.  Bleeding right great toe nail cleansed with Iodine and dressing applied. Return in 3 months or as needed.

## 2017-05-12 NOTE — Patient Instructions (Signed)
Seen for hypertrophic and ingrown nails. All nails debrided. Bleeding nail cleansed with iodine and dressing applied on right great toe. Return in 3 months or as needed.

## 2017-05-18 DIAGNOSIS — R3914 Feeling of incomplete bladder emptying: Secondary | ICD-10-CM | POA: Diagnosis not present

## 2017-05-19 DIAGNOSIS — R310 Gross hematuria: Secondary | ICD-10-CM | POA: Diagnosis not present

## 2017-05-19 DIAGNOSIS — R3914 Feeling of incomplete bladder emptying: Secondary | ICD-10-CM | POA: Diagnosis not present

## 2017-05-25 DIAGNOSIS — Z5111 Encounter for antineoplastic chemotherapy: Secondary | ICD-10-CM | POA: Diagnosis not present

## 2017-05-25 DIAGNOSIS — C61 Malignant neoplasm of prostate: Secondary | ICD-10-CM | POA: Diagnosis not present

## 2017-05-26 DIAGNOSIS — C61 Malignant neoplasm of prostate: Secondary | ICD-10-CM | POA: Diagnosis not present

## 2017-05-26 DIAGNOSIS — R338 Other retention of urine: Secondary | ICD-10-CM | POA: Diagnosis not present

## 2017-05-29 ENCOUNTER — Other Ambulatory Visit: Payer: Self-pay | Admitting: Cardiovascular Disease

## 2017-05-29 DIAGNOSIS — C9110 Chronic lymphocytic leukemia of B-cell type not having achieved remission: Secondary | ICD-10-CM

## 2017-06-01 ENCOUNTER — Ambulatory Visit: Payer: Medicare Other | Admitting: Cardiovascular Disease

## 2017-06-02 ENCOUNTER — Encounter: Payer: Self-pay | Admitting: Cardiovascular Disease

## 2017-06-02 ENCOUNTER — Ambulatory Visit (INDEPENDENT_AMBULATORY_CARE_PROVIDER_SITE_OTHER): Payer: Medicare Other | Admitting: Cardiovascular Disease

## 2017-06-02 ENCOUNTER — Ambulatory Visit (INDEPENDENT_AMBULATORY_CARE_PROVIDER_SITE_OTHER): Payer: Medicare Other | Admitting: *Deleted

## 2017-06-02 VITALS — BP 122/62 | HR 77 | Ht 70.0 in | Wt 208.4 lb

## 2017-06-02 DIAGNOSIS — I4892 Unspecified atrial flutter: Secondary | ICD-10-CM

## 2017-06-02 DIAGNOSIS — E782 Mixed hyperlipidemia: Secondary | ICD-10-CM

## 2017-06-02 DIAGNOSIS — I10 Essential (primary) hypertension: Secondary | ICD-10-CM | POA: Diagnosis not present

## 2017-06-02 DIAGNOSIS — I4891 Unspecified atrial fibrillation: Secondary | ICD-10-CM | POA: Diagnosis not present

## 2017-06-02 DIAGNOSIS — I482 Chronic atrial fibrillation, unspecified: Secondary | ICD-10-CM

## 2017-06-02 LAB — POCT INR: INR: 2.1

## 2017-06-02 NOTE — Patient Instructions (Signed)
Medication Instructions:  Your physician recommends that you continue on your current medications as directed. Please refer to the Current Medication list given to you today.   Labwork: None Ordered   Testing/Procedures: None Ordered   Follow-Up: Your physician wants you to follow-up in: 1 year with Dr. Nahser.  You will receive a reminder letter in the mail two months in advance. If you don't receive a letter, please call our office to schedule the follow-up appointment.   If you need a refill on your cardiac medications before your next appointment, please call your pharmacy.   Thank you for choosing [facility]! Rashaun Curl, RN [phone]    

## 2017-06-02 NOTE — Progress Notes (Signed)
Oscar Baker Date of Birth  06/03/1933 [place] [address]    [place], [place]  [address]    [place], [place]  [address] [phone]  Fax  [phone]  [phone]  Fax [phone]  Problem list: 1. Sick sinus syndrome with episodes of atrial flutter, frequent episodes of bradycardia - s/p pacer Aug. 2015.  CHADS2Vasc = 4 ( age 83, HTN ,DM)  2. Hypertension 3. Dyslipidemia 4. Diabetes mellitus 5. Arthroscopic knee surgery  History of Present Illness:  Oscar Baker is doing well.  He has asymptomatic bradycardia that we have followed since July 2000.  He has never had any syncope.  He had left knee surgery a year ago and right knee surgery in Dec. 2012.  No cardiac complaints.    He has done well since I last saw him.  He has bad knees and has not been walking much - he tries to avoid salt as much as possible.  He denies any chest pain, shortness breath, or syncope.  July 13, 2012 Oscar Baker is doing well.  No syncope or presyncope.  [name]    January 16, 2013:  Oscar Baker is doing well.  His HR continues to be slow.  He is asymptomatic - no syncope or presyncope.  Feb. 24, 2015:  Oscar Baker is doing well.  No syncope. No pre-syncope.  Getting his coumadin checked monthly.  No cp or dyspnea.  Had a URI this winter. We walked around the office and he increased his HR to 63.  He is able to get his heart rate up into the 70s when he is working out at [employer].  December 19, 2013:  Oscar Baker is here as a work in for fatigue and slow HR.    He has not had any episodes of syncope.   No chest pain , no dyspnea .  Dec. 30, 2015:  Oscar Baker is a 83 yo with hx of bradycardia and pacer,   placement, HTN, DM, -   No CP or dyspnea. Has had URI,  Has been on several courses of Abx.  I suggested Zyrtec or claritan   Dec. 6, 2016:  Doing well .  No CP , no dyspnea.    Has had a chronic sinus drainage.  ? Allergies, ? GERD    Dec. 12, 2017:  Doing well,     Seen with wife , Opal Sidles  Left  hip replacement 3 weeks ago .   [facility] Dame)  No cardiac complaints.     Dec. 12, 2018:  Prostate cancer has returned.  Sees Dr. Diona Fanti.   Has an indwelling catheter at this point.  No cardiac issues ,  Is moving - has lost his meds.  Has not had amlodipine in 3 weeks.  BP is still normal   Current Outpatient Medications on File Prior to Visit  Medication Sig Dispense Refill  . amLODipine (NORVASC) 10 MG tablet Take 10 mg by mouth at bedtime.   2  . aspirin 81 MG tablet Take 81 mg by mouth at bedtime. Will stop prior to surgery    . atorvastatin (LIPITOR) 20 MG tablet TAKE 1 TABLET BY MOUTH DAILY AT 6PM 90 tablet 0  . docusate sodium (COLACE) 100 MG capsule Take 1 capsule (100 mg total) by mouth 2 (two) times daily. 10 capsule 0  .  fenofibrate micronized (LOFIBRA) 200 MG capsule TAKE 1 CAPSULE BY MOUTH DAILY BEFORE BREAKFAST 90 capsule 2  . JANUVIA 100 MG tablet Take 100 mg by mouth at bedtime.   2  . metFORMIN (GLUCOPHAGE) 1000 MG tablet Take 1,000 mg by mouth 2 (two) times daily with a meal.      . multivitamin (THERAGRAN) per tablet Take 1 tablet by mouth daily. Will stop prior to procedure    . vitamin B-12 (CYANOCOBALAMIN) 1000 MCG tablet Take 1,000 mcg by mouth daily. Will stop prior to procedure    . warfarin (COUMADIN) 5 MG tablet TAKE AS DIRECTED BY COUMADIN CLINIC 90 tablet 1   No current facility-administered medications on file prior to visit.     Allergies  Allergen Reactions  . Altace [Ramipril] Other (See Comments)    "throat felt like had a knot in it"  . Codeine Nausea And Vomiting    Nausea and vomiting   . Simvastatin Other (See Comments)    Leg aches    Past Medical History:  Diagnosis Date  . CLL (chronic lymphocytic leukemia) ([place])   . Diabetes mellitus type 2, noninsulin dependent ([place])   . Hyperlipidemia   . Hypertension   .  Pacemaker 02/08/2014   DR Lovena Le  . prostate ca dx'd 2006   xrt; prostatectomy  . Sick sinus syndrome [facility])    a-Flutter with episodes of bradycardia; S/P Biotronik (serial number [id number])    Past Surgical History:  Procedure Laterality Date  . APPENDECTOMY    . BACK SURGERY     disk  . INSERT / REPLACE / REMOVE PACEMAKER  02/08/2014    DR Lovena Le  . PACEMAKER INSERTION  02/08/2014   Biotronik (serial number [id number])  . PERMANENT PACEMAKER INSERTION N/A 02/08/2014   Procedure: PERMANENT PACEMAKER INSERTION;  Surgeon: Evans Lance, MD;  Location: [facility] CATH LAB;  Service: Cardiovascular;  Laterality: N/A;  . TONSILLECTOMY    . TOTAL HIP ARTHROPLASTY Left 05/12/2016   Procedure: LEFT TOTAL HIP ARTHROPLASTY ANTERIOR APPROACH;  Surgeon: Paralee Cancel, MD;  Location: [facility] ORS;  Service: Orthopedics;  Laterality: Left;  . TOTAL KNEE ARTHROPLASTY      Social History   Tobacco Use  Smoking Status Former Smoker  . Years: 25.00  . Types: Pipe, Cigars  . Last attempt to quit: 11/25/1975  . Years since quitting: 41.5  Smokeless Tobacco Never Used    Social History   Substance and Sexual Activity  Alcohol Use Yes  . Alcohol/week: 0.0 oz   Comment: occasional    Family History  Problem Relation Age of Onset  . Emphysema Mother   . Allergic rhinitis Mother   . Heart attack Father   . Allergic rhinitis Brother   . Pulmonary fibrosis Brother     Reviw of Systems:  Reviewed in the HPI.  All other systems are negative.  Physical Exam: Blood pressure 122/62, pulse 77, height 5\' 10"  (1.778 m), weight 208 lb 6.4 oz (94.5 kg), SpO2 96 %.  GEN:  Well nourished, well developed in no acute distress HEENT: Normal NECK: No JVD; No carotid bruits LYMPHATICS: No lymphadenopathy CARDIAC: RR, no murmurs, rubs, gallops RESPIRATORY:  Clear to auscultation without rales, wheezing or rhonchi  ABDOMEN: Soft, non-tender, non-distended MUSCULOSKELETAL:  No edema; No deformity  SKIN: Warm and  dry NEUROLOGIC:  Alert and oriented x 3   ECG June 02, 2017: Atrial fibrillation.  Ventricular paced rhythm at 77 bpm.  Assessment / Plan:   1. Atrial  fib:     CHADS2Vasc = 4 ( age 83, HTN ,DM)   - s/p pacer Aug. 2015. For sick sinus syndrome  , doing well . No syncope   2. Hypertension-   .BP is great today despite the fact that he has not had amlodipine in 2-3 weeks.  I have advised him to continue to follow his blood pressure on a regular basis.  He is lost his amlodipine when he moved.  If his blood pressure increases we will try to provide him samples until he is able to refill his prescriptions.  Currently, his insurance company will not pay for additional medications.  3. Dyslipidemia-   he has lost his atorvastatin.  He still has a few fenofibrate's left .  4. Diabetes mellitus 5. Arthroscopic knee surgery   Mertie Moores, MD  06/02/2017 3:05 PM    [place],  [place], [place]  [address] Pager [phone] Phone: [phone]; Fax: [phone]

## 2017-06-02 NOTE — Patient Instructions (Addendum)
Continue on same dosage 1 tablet every day. Recheck INR in 6 weeks.  [facility] [phone]

## 2017-06-22 DIAGNOSIS — J189 Pneumonia, unspecified organism: Secondary | ICD-10-CM

## 2017-06-22 HISTORY — DX: Pneumonia, unspecified organism: J18.9

## 2017-06-29 ENCOUNTER — Other Ambulatory Visit: Payer: Self-pay | Admitting: Cardiovascular Disease

## 2017-06-29 DIAGNOSIS — H6062 Unspecified chronic otitis externa, left ear: Secondary | ICD-10-CM | POA: Diagnosis not present

## 2017-06-29 DIAGNOSIS — H6122 Impacted cerumen, left ear: Secondary | ICD-10-CM | POA: Diagnosis not present

## 2017-07-07 DIAGNOSIS — R310 Gross hematuria: Secondary | ICD-10-CM | POA: Diagnosis not present

## 2017-07-07 DIAGNOSIS — R3914 Feeling of incomplete bladder emptying: Secondary | ICD-10-CM | POA: Diagnosis not present

## 2017-07-12 ENCOUNTER — Ambulatory Visit (INDEPENDENT_AMBULATORY_CARE_PROVIDER_SITE_OTHER): Payer: Medicare Other | Admitting: *Deleted

## 2017-07-12 DIAGNOSIS — I495 Sick sinus syndrome: Secondary | ICD-10-CM

## 2017-07-12 NOTE — Progress Notes (Signed)
Remote pacemaker transmission.   

## 2017-07-13 ENCOUNTER — Ambulatory Visit (INDEPENDENT_AMBULATORY_CARE_PROVIDER_SITE_OTHER): Payer: Medicare Other | Admitting: *Deleted

## 2017-07-13 ENCOUNTER — Encounter: Payer: Self-pay | Admitting: Cardiology

## 2017-07-13 DIAGNOSIS — Z5181 Encounter for therapeutic drug level monitoring: Secondary | ICD-10-CM | POA: Diagnosis not present

## 2017-07-13 DIAGNOSIS — I4892 Unspecified atrial flutter: Secondary | ICD-10-CM

## 2017-07-13 DIAGNOSIS — I4891 Unspecified atrial fibrillation: Secondary | ICD-10-CM

## 2017-07-13 LAB — POCT INR: INR: 2.4

## 2017-07-13 NOTE — Patient Instructions (Signed)
Description   Continue on same dosage 1 tablet everyday.  Recheck INR in 6 weeks. [facility] [phone]     

## 2017-07-14 DIAGNOSIS — C61 Malignant neoplasm of prostate: Secondary | ICD-10-CM | POA: Diagnosis not present

## 2017-07-14 DIAGNOSIS — R310 Gross hematuria: Secondary | ICD-10-CM | POA: Diagnosis not present

## 2017-07-14 LAB — CUP PACEART REMOTE DEVICE CHECK
Battery Remaining Percentage: 75 %
Brady Statistic RV Percent Paced: 78 %
Implantable Lead Location: 753860
Implantable Lead Serial Number: 29625633
Lead Channel Pacing Threshold Amplitude: 0.6 V
Lead Channel Pacing Threshold Pulse Width: 0.4 ms
MDC IDC LEAD IMPLANT DT: 20150820
MDC IDC MSMT LEADCHNL RV IMPEDANCE VALUE: 492 Ohm
MDC IDC PG IMPLANT DT: 20150820
MDC IDC PG SERIAL: 68372291
MDC IDC SESS DTM: 20190123055132
MDC IDC SET LEADCHNL RV PACING AMPLITUDE: 2.4 V
MDC IDC SET LEADCHNL RV PACING PULSEWIDTH: 0.4 ms
Pulse Gen Model: 394934

## 2017-07-19 ENCOUNTER — Emergency Department (HOSPITAL_COMMUNITY): Payer: Medicare Other

## 2017-07-19 ENCOUNTER — Encounter (HOSPITAL_COMMUNITY): Payer: Self-pay

## 2017-07-19 ENCOUNTER — Other Ambulatory Visit: Payer: Self-pay

## 2017-07-19 ENCOUNTER — Inpatient Hospital Stay (HOSPITAL_COMMUNITY)
Admission: EM | Admit: 2017-07-19 | Discharge: 2017-07-23 | DRG: 152 | Disposition: A | Discharge status: Skilled Nursing Facility | Payer: Medicare Other | Attending: Nephrology | Admitting: Nephrology

## 2017-07-19 DIAGNOSIS — R05 Cough: Secondary | ICD-10-CM

## 2017-07-19 DIAGNOSIS — H6020 Malignant otitis externa, unspecified ear: Secondary | ICD-10-CM | POA: Diagnosis present

## 2017-07-19 DIAGNOSIS — T148XXA Other injury of unspecified body region, initial encounter: Secondary | ICD-10-CM | POA: Diagnosis not present

## 2017-07-19 DIAGNOSIS — S79911A Unspecified injury of right hip, initial encounter: Secondary | ICD-10-CM | POA: Diagnosis not present

## 2017-07-19 DIAGNOSIS — H9110 Presbycusis, unspecified ear: Secondary | ICD-10-CM | POA: Diagnosis present

## 2017-07-19 DIAGNOSIS — Z7901 Long term (current) use of anticoagulants: Secondary | ICD-10-CM | POA: Diagnosis not present

## 2017-07-19 DIAGNOSIS — E119 Type 2 diabetes mellitus without complications: Secondary | ICD-10-CM | POA: Diagnosis not present

## 2017-07-19 DIAGNOSIS — E871 Hypo-osmolality and hyponatremia: Secondary | ICD-10-CM | POA: Diagnosis present

## 2017-07-19 DIAGNOSIS — I480 Paroxysmal atrial fibrillation: Secondary | ICD-10-CM | POA: Diagnosis present

## 2017-07-19 DIAGNOSIS — M5184 Other intervertebral disc disorders, thoracic region: Secondary | ICD-10-CM | POA: Diagnosis not present

## 2017-07-19 DIAGNOSIS — W19XXXA Unspecified fall, initial encounter: Secondary | ICD-10-CM

## 2017-07-19 DIAGNOSIS — Z79899 Other long term (current) drug therapy: Secondary | ICD-10-CM

## 2017-07-19 DIAGNOSIS — H6690 Otitis media, unspecified, unspecified ear: Secondary | ICD-10-CM

## 2017-07-19 DIAGNOSIS — H7291 Unspecified perforation of tympanic membrane, right ear: Secondary | ICD-10-CM | POA: Diagnosis present

## 2017-07-19 DIAGNOSIS — G8929 Other chronic pain: Secondary | ICD-10-CM | POA: Diagnosis present

## 2017-07-19 DIAGNOSIS — Z23 Encounter for immunization: Secondary | ICD-10-CM | POA: Diagnosis not present

## 2017-07-19 DIAGNOSIS — I5022 Chronic systolic (congestive) heart failure: Secondary | ICD-10-CM

## 2017-07-19 DIAGNOSIS — R791 Abnormal coagulation profile: Secondary | ICD-10-CM | POA: Diagnosis present

## 2017-07-19 DIAGNOSIS — Z66 Do not resuscitate: Secondary | ICD-10-CM | POA: Diagnosis present

## 2017-07-19 DIAGNOSIS — S3993XA Unspecified injury of pelvis, initial encounter: Secondary | ICD-10-CM | POA: Diagnosis not present

## 2017-07-19 DIAGNOSIS — Z885 Allergy status to narcotic agent status: Secondary | ICD-10-CM

## 2017-07-19 DIAGNOSIS — L8990 Pressure ulcer of unspecified site, unspecified stage: Secondary | ICD-10-CM

## 2017-07-19 DIAGNOSIS — I5043 Acute on chronic combined systolic (congestive) and diastolic (congestive) heart failure: Secondary | ICD-10-CM | POA: Diagnosis present

## 2017-07-19 DIAGNOSIS — K5900 Constipation, unspecified: Secondary | ICD-10-CM | POA: Diagnosis not present

## 2017-07-19 DIAGNOSIS — E569 Vitamin deficiency, unspecified: Secondary | ICD-10-CM | POA: Diagnosis not present

## 2017-07-19 DIAGNOSIS — Z7289 Other problems related to lifestyle: Secondary | ICD-10-CM | POA: Diagnosis not present

## 2017-07-19 DIAGNOSIS — F5109 Other insomnia not due to a substance or known physiological condition: Secondary | ICD-10-CM | POA: Diagnosis not present

## 2017-07-19 DIAGNOSIS — Y92009 Unspecified place in unspecified non-institutional (private) residence as the place of occurrence of the external cause: Secondary | ICD-10-CM

## 2017-07-19 DIAGNOSIS — R262 Difficulty in walking, not elsewhere classified: Secondary | ICD-10-CM | POA: Diagnosis present

## 2017-07-19 DIAGNOSIS — A4901 Methicillin susceptible Staphylococcus aureus infection, unspecified site: Secondary | ICD-10-CM | POA: Diagnosis present

## 2017-07-19 DIAGNOSIS — J80 Acute respiratory distress syndrome: Secondary | ICD-10-CM | POA: Diagnosis not present

## 2017-07-19 DIAGNOSIS — R059 Cough, unspecified: Secondary | ICD-10-CM

## 2017-07-19 DIAGNOSIS — J3089 Other allergic rhinitis: Secondary | ICD-10-CM | POA: Diagnosis not present

## 2017-07-19 DIAGNOSIS — Z825 Family history of asthma and other chronic lower respiratory diseases: Secondary | ICD-10-CM

## 2017-07-19 DIAGNOSIS — Z7982 Long term (current) use of aspirin: Secondary | ICD-10-CM

## 2017-07-19 DIAGNOSIS — I454 Nonspecific intraventricular block: Secondary | ICD-10-CM | POA: Diagnosis present

## 2017-07-19 DIAGNOSIS — I110 Hypertensive heart disease with heart failure: Secondary | ICD-10-CM | POA: Diagnosis present

## 2017-07-19 DIAGNOSIS — D473 Essential (hemorrhagic) thrombocythemia: Secondary | ICD-10-CM | POA: Diagnosis not present

## 2017-07-19 DIAGNOSIS — S0101XD Laceration without foreign body of scalp, subsequent encounter: Secondary | ICD-10-CM | POA: Diagnosis not present

## 2017-07-19 DIAGNOSIS — R0602 Shortness of breath: Secondary | ICD-10-CM | POA: Diagnosis not present

## 2017-07-19 DIAGNOSIS — J0140 Acute pansinusitis, unspecified: Secondary | ICD-10-CM | POA: Diagnosis present

## 2017-07-19 DIAGNOSIS — I5021 Acute systolic (congestive) heart failure: Secondary | ICD-10-CM | POA: Diagnosis not present

## 2017-07-19 DIAGNOSIS — N400 Enlarged prostate without lower urinary tract symptoms: Secondary | ICD-10-CM | POA: Diagnosis present

## 2017-07-19 DIAGNOSIS — Z87891 Personal history of nicotine dependence: Secondary | ICD-10-CM | POA: Diagnosis not present

## 2017-07-19 DIAGNOSIS — Z111 Encounter for screening for respiratory tuberculosis: Secondary | ICD-10-CM | POA: Diagnosis not present

## 2017-07-19 DIAGNOSIS — Z7984 Long term (current) use of oral hypoglycemic drugs: Secondary | ICD-10-CM

## 2017-07-19 DIAGNOSIS — Z8546 Personal history of malignant neoplasm of prostate: Secondary | ICD-10-CM

## 2017-07-19 DIAGNOSIS — H66011 Acute suppurative otitis media with spontaneous rupture of ear drum, right ear: Secondary | ICD-10-CM | POA: Diagnosis not present

## 2017-07-19 DIAGNOSIS — Z888 Allergy status to other drugs, medicaments and biological substances status: Secondary | ICD-10-CM

## 2017-07-19 DIAGNOSIS — M25551 Pain in right hip: Secondary | ICD-10-CM | POA: Diagnosis not present

## 2017-07-19 DIAGNOSIS — R0982 Postnasal drip: Secondary | ICD-10-CM | POA: Diagnosis present

## 2017-07-19 DIAGNOSIS — M5186 Other intervertebral disc disorders, lumbar region: Secondary | ICD-10-CM | POA: Diagnosis not present

## 2017-07-19 DIAGNOSIS — H5500 Unspecified nystagmus: Secondary | ICD-10-CM | POA: Diagnosis present

## 2017-07-19 DIAGNOSIS — Z96659 Presence of unspecified artificial knee joint: Secondary | ICD-10-CM | POA: Diagnosis present

## 2017-07-19 DIAGNOSIS — H6091 Unspecified otitis externa, right ear: Secondary | ICD-10-CM

## 2017-07-19 DIAGNOSIS — I5040 Unspecified combined systolic (congestive) and diastolic (congestive) heart failure: Secondary | ICD-10-CM | POA: Diagnosis not present

## 2017-07-19 DIAGNOSIS — H6691 Otitis media, unspecified, right ear: Principal | ICD-10-CM | POA: Diagnosis present

## 2017-07-19 DIAGNOSIS — I10 Essential (primary) hypertension: Secondary | ICD-10-CM | POA: Diagnosis not present

## 2017-07-19 DIAGNOSIS — I5032 Chronic diastolic (congestive) heart failure: Secondary | ICD-10-CM | POA: Diagnosis not present

## 2017-07-19 DIAGNOSIS — S0990XA Unspecified injury of head, initial encounter: Secondary | ICD-10-CM | POA: Diagnosis not present

## 2017-07-19 DIAGNOSIS — I4891 Unspecified atrial fibrillation: Secondary | ICD-10-CM | POA: Diagnosis not present

## 2017-07-19 DIAGNOSIS — M6281 Muscle weakness (generalized): Secondary | ICD-10-CM | POA: Diagnosis not present

## 2017-07-19 DIAGNOSIS — Z9181 History of falling: Secondary | ICD-10-CM | POA: Diagnosis not present

## 2017-07-19 DIAGNOSIS — S0081XA Abrasion of other part of head, initial encounter: Secondary | ICD-10-CM | POA: Diagnosis not present

## 2017-07-19 DIAGNOSIS — Z950 Presence of cardiac pacemaker: Secondary | ICD-10-CM | POA: Diagnosis not present

## 2017-07-19 DIAGNOSIS — H9191 Unspecified hearing loss, right ear: Secondary | ICD-10-CM | POA: Diagnosis present

## 2017-07-19 DIAGNOSIS — W1830XA Fall on same level, unspecified, initial encounter: Secondary | ICD-10-CM | POA: Diagnosis present

## 2017-07-19 DIAGNOSIS — S0101XA Laceration without foreign body of scalp, initial encounter: Secondary | ICD-10-CM | POA: Diagnosis not present

## 2017-07-19 DIAGNOSIS — R2689 Other abnormalities of gait and mobility: Secondary | ICD-10-CM | POA: Diagnosis not present

## 2017-07-19 DIAGNOSIS — C9110 Chronic lymphocytic leukemia of B-cell type not having achieved remission: Secondary | ICD-10-CM | POA: Diagnosis present

## 2017-07-19 DIAGNOSIS — I495 Sick sinus syndrome: Secondary | ICD-10-CM | POA: Diagnosis present

## 2017-07-19 DIAGNOSIS — R279 Unspecified lack of coordination: Secondary | ICD-10-CM | POA: Diagnosis not present

## 2017-07-19 DIAGNOSIS — R51 Headache: Secondary | ICD-10-CM | POA: Diagnosis not present

## 2017-07-19 DIAGNOSIS — E785 Hyperlipidemia, unspecified: Secondary | ICD-10-CM | POA: Diagnosis not present

## 2017-07-19 DIAGNOSIS — I429 Cardiomyopathy, unspecified: Secondary | ICD-10-CM | POA: Diagnosis not present

## 2017-07-19 DIAGNOSIS — Z96643 Presence of artificial hip joint, bilateral: Secondary | ICD-10-CM | POA: Diagnosis present

## 2017-07-19 DIAGNOSIS — Z856 Personal history of leukemia: Secondary | ICD-10-CM | POA: Diagnosis not present

## 2017-07-19 DIAGNOSIS — H60541 Acute eczematoid otitis externa, right ear: Secondary | ICD-10-CM | POA: Diagnosis not present

## 2017-07-19 DIAGNOSIS — I5033 Acute on chronic diastolic (congestive) heart failure: Secondary | ICD-10-CM | POA: Diagnosis present

## 2017-07-19 DIAGNOSIS — Z9079 Acquired absence of other genital organ(s): Secondary | ICD-10-CM

## 2017-07-19 DIAGNOSIS — Z8249 Family history of ischemic heart disease and other diseases of the circulatory system: Secondary | ICD-10-CM

## 2017-07-19 DIAGNOSIS — S199XXA Unspecified injury of neck, initial encounter: Secondary | ICD-10-CM | POA: Diagnosis not present

## 2017-07-19 LAB — COMPREHENSIVE METABOLIC PANEL
ALBUMIN: 2.4 g/dL — AB (ref 3.5–5.0)
ALT: 19 U/L (ref 17–63)
ANION GAP: 12 (ref 5–15)
AST: 26 U/L (ref 15–41)
Alkaline Phosphatase: 71 U/L (ref 38–126)
BILIRUBIN TOTAL: 1 mg/dL (ref 0.3–1.2)
BUN: 13 mg/dL (ref 6–20)
CHLORIDE: 100 mmol/L — AB (ref 101–111)
CO2: 18 mmol/L — AB (ref 22–32)
Calcium: 8.2 mg/dL — ABNORMAL LOW (ref 8.9–10.3)
Creatinine, Ser: 0.89 mg/dL (ref 0.61–1.24)
GFR calc Af Amer: >60 mL/min (ref >60)
GFR calc non Af Amer: >60 mL/min (ref >60)
GLUCOSE: 294 mg/dL — AB (ref 65–99)
POTASSIUM: 4.4 mmol/L (ref 3.5–5.1)
SODIUM: 130 mmol/L — AB (ref 135–145)
TOTAL PROTEIN: 5.5 g/dL — AB (ref 6.5–8.1)

## 2017-07-19 LAB — CBC WITH DIFFERENTIAL/PLATELET
BAND NEUTROPHILS: 8 %
BASOS ABS: 0 10*3/uL (ref 0.0–0.1)
BASOS PCT: 0 %
BLASTS: 0 %
EOS ABS: 0 10*3/uL (ref 0.0–0.7)
EOS PCT: 0 %
HEMATOCRIT: 31.2 % — AB (ref 39.0–52.0)
Hemoglobin: 10.4 g/dL — ABNORMAL LOW (ref 13.0–17.0)
LYMPHS ABS: 30.9 10*3/uL — AB (ref 0.7–4.0)
LYMPHS PCT: 55 %
MCH: 32.1 pg (ref 26.0–34.0)
MCHC: 33.3 g/dL (ref 30.0–36.0)
MCV: 96.3 fL (ref 78.0–100.0)
METAMYELOCYTES PCT: 1 %
MONO ABS: 2.8 10*3/uL — AB (ref 0.1–1.0)
MONOS PCT: 5 %
Myelocytes: 1 %
NEUTROS ABS: 22.4 10*3/uL — AB (ref 1.7–7.7)
Neutrophils Relative %: 30 %
OTHER: 0 %
PLATELETS: 445 10*3/uL — AB (ref 150–400)
Promyelocytes Absolute: 0 %
RBC: 3.24 MIL/uL — ABNORMAL LOW (ref 4.22–5.81)
RDW: 18.2 % — AB (ref 11.5–15.5)
WBC: 56.1 10*3/uL (ref 4.0–10.5)
nRBC: 0 /100 WBC

## 2017-07-19 LAB — URINALYSIS, ROUTINE W REFLEX MICROSCOPIC
BILIRUBIN URINE: NEGATIVE
Glucose, UA: >=500 mg/dL — AB
KETONES UR: 20 mg/dL — AB
LEUKOCYTES UA: NEGATIVE
Nitrite: POSITIVE — AB
Protein, ur: 30 mg/dL — AB
SPECIFIC GRAVITY, URINE: 1.044 — AB (ref 1.005–1.030)
SQUAMOUS EPITHELIAL / LPF: NONE SEEN
pH: 6 (ref 5.0–8.0)

## 2017-07-19 LAB — GLUCOSE, CAPILLARY: Glucose-Capillary: 234 mg/dL — ABNORMAL HIGH (ref 65–99)

## 2017-07-19 LAB — BRAIN NATRIURETIC PEPTIDE: B NATRIURETIC PEPTIDE 5: 727.6 pg/mL — AB (ref 0.0–100.0)

## 2017-07-19 LAB — PROTIME-INR
INR: 4.39
Prothrombin Time: 41.6 seconds — ABNORMAL HIGH (ref 11.4–15.2)

## 2017-07-19 LAB — I-STAT TROPONIN, ED: Troponin i, poc: 0 ng/mL (ref 0.00–0.08)

## 2017-07-19 LAB — CBG MONITORING, ED
Glucose-Capillary: 263 mg/dL — ABNORMAL HIGH (ref 65–99)
Glucose-Capillary: 252 mg/dL — ABNORMAL HIGH (ref 65–99)

## 2017-07-19 LAB — INFLUENZA PANEL BY PCR (TYPE A & B)
INFLAPCR: NEGATIVE
INFLBPCR: NEGATIVE

## 2017-07-19 LAB — I-STAT CG4 LACTIC ACID, ED
LACTIC ACID, VENOUS: 1.22 mmol/L (ref 0.5–1.9)
LACTIC ACID, VENOUS: 2.77 mmol/L — AB (ref 0.5–1.9)

## 2017-07-19 MED ORDER — ACETAMINOPHEN 325 MG PO TABS
650.0000 mg | ORAL_TABLET | Freq: Once | ORAL | Status: AC
  Administered 2017-07-19: 650 mg via ORAL
  Filled 2017-07-19: qty 2

## 2017-07-19 MED ORDER — TRAMADOL HCL 50 MG PO TABS
50.0000 mg | ORAL_TABLET | Freq: Four times a day (QID) | ORAL | Status: DC | PRN
  Administered 2017-07-19 – 2017-07-22 (×3): 50 mg via ORAL
  Filled 2017-07-19 (×3): qty 1

## 2017-07-19 MED ORDER — LIDOCAINE HCL 4 % EX SOLN: 0.0000 mL | Freq: Once | CUTANEOUS | Status: DC | PRN

## 2017-07-19 MED ORDER — OXYMETAZOLINE HCL 0.05 % NA SOLN: 1.0000 | Freq: Once | NASAL | Status: DC | PRN

## 2017-07-19 MED ORDER — AMLODIPINE BESYLATE 10 MG PO TABS
10.0000 mg | ORAL_TABLET | Freq: Every day | ORAL | Status: DC
  Administered 2017-07-20 – 2017-07-22 (×3): 10 mg via ORAL
  Filled 2017-07-19 (×3): qty 1

## 2017-07-19 MED ORDER — INSULIN ASPART 100 UNIT/ML ~~LOC~~ SOLN
0.0000 [IU] | Freq: Three times a day (TID) | SUBCUTANEOUS | Status: DC
  Administered 2017-07-19 – 2017-07-20 (×3): 5 [IU] via SUBCUTANEOUS
  Administered 2017-07-20 – 2017-07-21 (×2): 3 [IU] via SUBCUTANEOUS
  Administered 2017-07-21 (×2): 5 [IU] via SUBCUTANEOUS
  Administered 2017-07-22: 3 [IU] via SUBCUTANEOUS
  Administered 2017-07-22: 5 [IU] via SUBCUTANEOUS
  Administered 2017-07-22: 7 [IU] via SUBCUTANEOUS
  Administered 2017-07-23 (×2): 9 [IU] via SUBCUTANEOUS
  Filled 2017-07-19: qty 1

## 2017-07-19 MED ORDER — NEOMYCIN-POLYMYXIN-HC 3.5-10000-1 OT SUSP
3.0000 [drp] | Freq: Four times a day (QID) | OTIC | Status: DC
  Administered 2017-07-20 – 2017-07-23 (×15): 3 [drp] via OTIC
  Filled 2017-07-19: qty 10

## 2017-07-19 MED ORDER — NEOMYCIN-COLIST-HC-THONZONIUM 3.3-3-10-0.5 MG/ML OT SUSP
3.0000 [drp] | Freq: Once | OTIC | Status: DC
  Filled 2017-07-19: qty 10

## 2017-07-19 MED ORDER — IOPAMIDOL (ISOVUE-300) INJECTION 61%
INTRAVENOUS | Status: AC
  Administered 2017-07-19: 75 mL
  Filled 2017-07-19: qty 75

## 2017-07-19 MED ORDER — SODIUM CHLORIDE 0.9% FLUSH
3.0000 mL | Freq: Two times a day (BID) | INTRAVENOUS | Status: DC
  Administered 2017-07-20 – 2017-07-22 (×7): 3 mL via INTRAVENOUS

## 2017-07-19 MED ORDER — FLUTICASONE PROPIONATE 50 MCG/ACT NA SUSP
2.0000 | Freq: Every day | NASAL | Status: DC
  Administered 2017-07-20 – 2017-07-23 (×5): 2 via NASAL
  Filled 2017-07-19: qty 16

## 2017-07-19 MED ORDER — ONDANSETRON HCL 4 MG/2ML IJ SOLN: 4.0000 mg | Freq: Four times a day (QID) | INTRAMUSCULAR | Status: DC | PRN

## 2017-07-19 MED ORDER — TRIPLE ANTIBIOTIC 3.5-400-5000 EX OINT
1.0000 "application " | TOPICAL_OINTMENT | Freq: Once | CUTANEOUS | Status: DC | PRN
  Filled 2017-07-19: qty 1

## 2017-07-19 MED ORDER — FUROSEMIDE 10 MG/ML IJ SOLN
40.0000 mg | Freq: Two times a day (BID) | INTRAMUSCULAR | Status: DC
  Administered 2017-07-20 – 2017-07-23 (×7): 40 mg via INTRAVENOUS
  Filled 2017-07-19 (×7): qty 4

## 2017-07-19 MED ORDER — PIPERACILLIN-TAZOBACTAM 3.375 G IVPB
3.3750 g | Freq: Three times a day (TID) | INTRAVENOUS | Status: DC
  Administered 2017-07-19 – 2017-07-23 (×12): 3.375 g via INTRAVENOUS
  Filled 2017-07-19 (×13): qty 50

## 2017-07-19 MED ORDER — ADULT MULTIVITAMIN W/MINERALS CH
1.0000 | ORAL_TABLET | Freq: Every day | ORAL | Status: DC
  Administered 2017-07-20 – 2017-07-23 (×4): 1 via ORAL
  Filled 2017-07-19 (×4): qty 1

## 2017-07-19 MED ORDER — SODIUM CHLORIDE 0.9 % IV SOLN: 250.0000 mL | INTRAVENOUS | Status: DC | PRN

## 2017-07-19 MED ORDER — CLINDAMYCIN PHOSPHATE 600 MG/50ML IV SOLN: 600.0000 mg | Freq: Once | INTRAVENOUS | Status: DC

## 2017-07-19 MED ORDER — NEOMYCIN-POLYMYXIN-HC 3.5-10000-1 OT SUSP
3.0000 [drp] | Freq: Once | OTIC | Status: AC
  Administered 2017-07-19: 3 [drp] via OTIC
  Filled 2017-07-19: qty 10

## 2017-07-19 MED ORDER — LIDOCAINE HCL 2 % EX GEL
1.0000 "application " | Freq: Once | CUTANEOUS | Status: DC | PRN
  Filled 2017-07-19: qty 5

## 2017-07-19 MED ORDER — OXYMETAZOLINE HCL 0.05 % NA SOLN
2.0000 | Freq: Two times a day (BID) | NASAL | Status: AC
  Administered 2017-07-20 – 2017-07-21 (×4): 2 via NASAL
  Filled 2017-07-19: qty 15

## 2017-07-19 MED ORDER — DOCUSATE SODIUM 100 MG PO CAPS
100.0000 mg | ORAL_CAPSULE | Freq: Two times a day (BID) | ORAL | Status: DC
  Administered 2017-07-20 – 2017-07-23 (×6): 100 mg via ORAL
  Filled 2017-07-19 (×6): qty 1

## 2017-07-19 MED ORDER — ASPIRIN EC 81 MG PO TBEC
81.0000 mg | DELAYED_RELEASE_TABLET | Freq: Every day | ORAL | Status: DC
  Administered 2017-07-20 – 2017-07-22 (×4): 81 mg via ORAL
  Filled 2017-07-19 (×4): qty 1

## 2017-07-19 MED ORDER — VITAMIN B-12 1000 MCG PO TABS
1000.0000 ug | ORAL_TABLET | Freq: Every day | ORAL | Status: DC
  Administered 2017-07-20 – 2017-07-23 (×4): 1000 ug via ORAL
  Filled 2017-07-19 (×4): qty 1

## 2017-07-19 MED ORDER — SALINE SPRAY 0.65 % NA SOLN
2.0000 | NASAL | Status: DC | PRN
  Filled 2017-07-19: qty 44

## 2017-07-19 MED ORDER — SILVER NITRATE-POT NITRATE 75-25 % EX MISC
1.0000 | Freq: Once | CUTANEOUS | Status: DC | PRN
  Filled 2017-07-19: qty 1

## 2017-07-19 MED ORDER — TETANUS-DIPHTH-ACELL PERTUSSIS 5-2.5-18.5 LF-MCG/0.5 IM SUSP
0.5000 mL | Freq: Once | INTRAMUSCULAR | Status: AC
  Administered 2017-07-19: 0.5 mL via INTRAMUSCULAR
  Filled 2017-07-19: qty 0.5

## 2017-07-19 MED ORDER — LIDOCAINE-EPINEPHRINE (PF) 1 %-1:200000 IJ SOLN
0.0000 mL | Freq: Once | INTRAMUSCULAR | Status: DC | PRN
  Filled 2017-07-19: qty 30

## 2017-07-19 MED ORDER — LIDOCAINE-EPINEPHRINE-TETRACAINE (LET) SOLUTION
3.0000 mL | Freq: Once | NASAL | Status: AC
  Administered 2017-07-19: 3 mL via TOPICAL
  Filled 2017-07-19: qty 3

## 2017-07-19 MED ORDER — ACETAMINOPHEN 325 MG PO TABS
650.0000 mg | ORAL_TABLET | ORAL | Status: DC | PRN
  Administered 2017-07-23: 650 mg via ORAL
  Filled 2017-07-19 (×2): qty 2

## 2017-07-19 MED ORDER — DEXTROSE 5 % IV SOLN
100.0000 mg | Freq: Two times a day (BID) | INTRAVENOUS | Status: DC
  Administered 2017-07-19 – 2017-07-23 (×8): 100 mg via INTRAVENOUS
  Filled 2017-07-19 (×10): qty 100

## 2017-07-19 MED ORDER — SODIUM CHLORIDE 0.9% FLUSH: 3.0000 mL | INTRAVENOUS | Status: DC | PRN

## 2017-07-19 MED ORDER — ATORVASTATIN CALCIUM 20 MG PO TABS
20.0000 mg | ORAL_TABLET | Freq: Every day | ORAL | Status: DC
  Administered 2017-07-20 – 2017-07-22 (×3): 20 mg via ORAL
  Filled 2017-07-19 (×3): qty 1

## 2017-07-19 MED ORDER — LIDOCAINE-EPINEPHRINE (PF) 2 %-1:200000 IJ SOLN
10.0000 mL | Freq: Once | INTRAMUSCULAR | Status: AC
  Administered 2017-07-19: 10 mL
  Filled 2017-07-19: qty 20

## 2017-07-19 NOTE — ED Notes (Signed)
Called lab.  They are running bnp.

## 2017-07-19 NOTE — ED Notes (Signed)
Pt taken to Xray.

## 2017-07-19 NOTE — ED Provider Notes (Signed)
[facility] EMERGENCY DEPARTMENT Provider Note   CSN: [id number] Arrival date & time: 07/19/17  0946     History   Chief Complaint Chief Complaint  Patient presents with  . Fall    HPI Oscar Baker is a 84 y.o. male.  HPI 84 year old Caucasian male with past medical history significant for hypertension, hyperlipidemia, CLL, diabetes, prostate cancer, A. fib currently anticoagulated with Coumadin that presents to the emergency department today with multiple complaints.  Patient was in his recliner this morning when his wife heard him yell for help.  Patient states that when he went to get out of his recliner his right hip gave out and he fell striking the back of his head on the ground.  Patient was not able to ambulate following the accident 9 1 was called and sent to the ED for evaluation.  On further evaluation and history patient does state that he has had a worsening cough for the past 2-3 days.  Reports sputum production.  He also reports some chest congestion, shortness of breath.  Denies any associated chest pain.  He denies associated URI symptoms including sore throat, nasal congestion, fevers or chills.  Patient denies any history of CHF and is not taking any diuretics.  Patient also complains of some drainage of brown clear fluid from his right ear with associated hearing loss and pain for the past 2-3 days.  Patient also notes some redness behind his right ear with some swollen lymph nodes and tenderness to palpation.  Nothing makes his symptoms better or worse.  Again patient complains of right hip pain after fall today along with laceration to the back of the posterior occiput.  Patient has not been ambulatory due to pain in his right hip.  Patient does report that the right hip pain is chronic in nature however it is worsened after fall today.  Reports generalized weakness for the past week.  Patient does have CLL but is not taking any chemotherapy or radiation.   He is followed by oncology.  Unsure of last tetanus shot.  Pt denies any fever, chill, ha, vision changes, lightheadedness, dizziness, congestion, neck pain, cp,abd pain, n/v/d, urinary symptoms, change in bowel habits, melena, hematochezia, lower extremity paresthesias.  Past Medical History:  Diagnosis Date  . CLL (chronic lymphocytic leukemia) ([place])   . Diabetes mellitus type 2, noninsulin dependent ([place])   . Hyperlipidemia   . Hypertension   . Pacemaker 02/08/2014   DR Lovena Le  . prostate ca dx'd 2006   xrt; prostatectomy  . Sick sinus syndrome [facility])    a-Flutter with episodes of bradycardia; S/P Biotronik (serial number [id number])    Patient Active Problem List   Diagnosis Date Noted  . S/P left THA, AA 05/12/2016  . LPRD (laryngopharyngeal reflux disease) 05/21/2015  . Other allergic rhinitis 05/21/2015  . Obesity 12/12/2014  . Upper airway cough syndrome 12/11/2014  . Cough variant asthma 07/27/2014  . Wheezing 06/20/2014  . Symptomatic bradycardia - s/p Biotronik (serial number [id number]) 02/08/2014  . Diabetes mellitus type 2, noninsulin dependent ([place])   . Edema of foot 03/06/2013  . Traumatic ecchymosis of right foot 03/06/2013  . Bone spur 12/30/2012  . Pain of toe of left foot 12/30/2012  . Hyperlipidemia 07/13/2012  . HTN (hypertension) 04/12/2012  . Sick sinus syndrome ([place]) 08/17/2011  . Atrial fibrillation ([place]) 09/16/2010    Past Surgical History:  Procedure Laterality Date  . APPENDECTOMY    . BACK SURGERY  disk  . INSERT / REPLACE / REMOVE PACEMAKER  02/08/2014    DR Lovena Le  . PACEMAKER INSERTION  02/08/2014   Biotronik (serial number [id number])  . PERMANENT PACEMAKER INSERTION N/A 02/08/2014   Procedure: PERMANENT PACEMAKER INSERTION;  Surgeon: Evans Lance, MD;  Location: [facility] CATH LAB;  Service: Cardiovascular;  Laterality: N/A;  . TONSILLECTOMY    . TOTAL HIP ARTHROPLASTY Left 05/12/2016   Procedure: LEFT TOTAL HIP ARTHROPLASTY ANTERIOR  APPROACH;  Surgeon: Paralee Cancel, MD;  Location: [facility] ORS;  Service: Orthopedics;  Laterality: Left;  . TOTAL KNEE ARTHROPLASTY         Home Medications    Prior to Admission medications   Medication Sig Start Date End Date Taking? Authorizing Provider  acetaminophen (TYLENOL) 500 MG tablet Take 1,000 mg by mouth as needed for mild pain.   Yes [provider]  aspirin 81 MG tablet Take 81 mg by mouth at bedtime. Will stop prior to surgery   Yes [provider]  atorvastatin (LIPITOR) 20 MG tablet TAKE 1 TABLET BY MOUTH DAILY AT 6PM 06/01/17  Yes Nahser, Wonda Cheng, MD  docusate sodium (COLACE) 100 MG capsule Take 1 capsule (100 mg total) by mouth 2 (two) times daily. 05/13/16  Yes Babish, Rodman Key, PA-C  fenofibrate micronized (LOFIBRA) 200 MG capsule TAKE 1 CAPSULE BY MOUTH DAILY BEFORE BREAKFAST 06/29/17  Yes Nahser, Wonda Cheng, MD  fluticasone (FLONASE) 50 MCG/ACT nasal spray Place 1 spray into both nostrils as needed for allergies or rhinitis.   Yes [provider]  glipiZIDE (GLUCOTROL XL) 2.5 MG 24 hr tablet Take 2.5 mg by mouth daily with breakfast.   Yes [provider]  JANUVIA 100 MG tablet Take 100 mg by mouth at bedtime.  02/27/17  Yes [provider]  metFORMIN (GLUCOPHAGE) 1000 MG tablet Take 1,000 mg by mouth 2 (two) times daily with a meal.     Yes [provider]  multivitamin (THERAGRAN) per tablet Take 1 tablet by mouth daily. Will stop prior to procedure   Yes [provider]  vitamin B-12 (CYANOCOBALAMIN) 1000 MCG tablet Take 1,000 mcg by mouth daily. Will stop prior to procedure   Yes [provider]  warfarin (COUMADIN) 5 MG tablet TAKE AS DIRECTED BY COUMADIN CLINIC 06/02/17  Yes Nahser, Wonda Cheng, MD  amLODipine (NORVASC) 10 MG tablet Take 10 mg by mouth at bedtime.  04/28/14   [provider]    Family History Family History  Problem Relation Age of Onset  . Emphysema Mother   . Allergic  rhinitis Mother   . Heart attack Father   . Allergic rhinitis Brother   . Pulmonary fibrosis Brother     Social History Social History   Tobacco Use  . Smoking status: Former Smoker    Years: 25.00    Types: Pipe, Cigars    Last attempt to quit: 11/25/1975    Years since quitting: 41.6  . Smokeless tobacco: Never Used  Substance Use Topics  . Alcohol use: Yes    Alcohol/week: 0.0 oz    Comment: occasional  . Drug use: No     Allergies   Altace [ramipril]; Codeine; and Simvastatin   Review of Systems Review of Systems  Constitutional: Negative for chills and fever.  HENT: Positive for congestion, ear discharge and ear pain. Negative for sore throat.   Eyes: Negative for visual disturbance.  Respiratory: Positive for cough and shortness of breath.   Cardiovascular: Negative for chest pain.  Gastrointestinal: Negative for abdominal pain, diarrhea, nausea and vomiting.  Genitourinary: Negative for dysuria, flank pain, frequency, hematuria, scrotal swelling, testicular pain and urgency.  Musculoskeletal: Positive for arthralgias and myalgias.  Skin: Positive for wound. Negative for rash.  Neurological: Negative for dizziness, syncope, weakness, light-headedness, numbness and headaches.  Psychiatric/Behavioral: Negative for sleep disturbance. The patient is not nervous/anxious.      Physical Exam Updated Vital Signs BP (!) 122/50   Pulse (!) 59   Temp 98.6 F (37 C) (Oral)   Resp (!) 21   Ht 6' (1.829 m)   Wt 94.3 kg (208 lb)   SpO2 95%   BMI 28.21 kg/m   Physical Exam  Constitutional: He is oriented to person, place, and time. He appears well-developed and well-nourished.  Non-toxic appearance. No distress.  HENT:  Head: Normocephalic.  Right Ear: There is drainage, swelling and tenderness. There is mastoid tenderness. A middle ear effusion is present.  Left Ear: Tympanic membrane, external ear and ear canal normal.  Mouth/Throat: Oropharynx is clear and moist.   Patient has erythema to the posterior aspect of the right ear with some tenderness over the mastoid region.  Does have postauricular lymph node enlargement that are tender to palpation.  Some redness streaks down the right side of his neck.  Patient has brown serous drainage from the right ear unable to visualize the TM due to the edematous canal.  Minimal pain with manipulation of the auricle.  Patient has approximately 1 cm laceration to the posterior occiput with bleeding controlled.  No skull depression noted.  No hemotympanum no septal hematoma.  No raccoon eyes or battle sign.  Eyes: Conjunctivae and EOM are normal. Pupils are equal, round, and reactive to light. Right eye exhibits no discharge. Left eye exhibits no discharge.  Neck: Normal range of motion. Neck supple.  No c spine midline tenderness. No paraspinal tenderness. No deformities or step offs noted. Full ROM. Supple. No nuchal rigidity.    Cardiovascular: Normal rate, regular rhythm, normal heart sounds and intact distal pulses. Exam reveals no gallop and no friction rub.  No murmur heard. Pulmonary/Chest: Effort normal. Tachypnea noted. No respiratory distress. He has rhonchi. He has rales. He exhibits no tenderness.  Abdominal: Soft. Bowel sounds are normal. He exhibits no distension. There is no tenderness. There is no rebound and no guarding.  Musculoskeletal: Normal range of motion. He exhibits no tenderness.  No midline T spine or L spine tenderness. No deformities or step offs noted. Full ROM. Pelvis is stable.  Patient does have some pain to palpation of the right SI joint.  No obvious edema or ecchymosis noted.  Limited range of motion due to pain.  Patient able to bear weight on the right hip due to the pain.  DP pulses are 2+ bilaterally.  Sensation intact.  Brisk cap refill.  Full range motion of the right knee without pain.  Lymphadenopathy:    He has no cervical adenopathy.  Neurological: He is alert and oriented to  person, place, and time.  Skin: Skin is warm and dry. Capillary refill takes less than 2 seconds. No rash noted.  Psychiatric: His behavior is normal. Judgment and thought content normal.  Nursing note and vitals reviewed.    ED Treatments / Results  Labs (all labs ordered are listed, but only abnormal results are displayed) Labs Reviewed  COMPREHENSIVE METABOLIC PANEL - Abnormal; Notable for the following components:      Result Value   Sodium 130 (*)  Chloride 100 (*)    CO2 18 (*)    Glucose, Bld 294 (*)    Calcium 8.2 (*)    Total Protein 5.5 (*)    Albumin 2.4 (*)    All other components within normal limits  CBC WITH DIFFERENTIAL/PLATELET - Abnormal; Notable for the following components:   WBC 56.1 (*)    RBC 3.24 (*)    Hemoglobin 10.4 (*)    HCT 31.2 (*)    RDW 18.2 (*)    Platelets 445 (*)    Neutro Abs 22.4 (*)    Lymphs Abs 30.9 (*)    Monocytes Absolute 2.8 (*)    All other components within normal limits  URINALYSIS, ROUTINE W REFLEX MICROSCOPIC - Abnormal; Notable for the following components:   APPearance HAZY (*)    Specific Gravity, Urine 1.044 (*)    Glucose, UA >=500 (*)    Hgb urine dipstick LARGE (*)    Ketones, ur 20 (*)    Protein, ur 30 (*)    Nitrite POSITIVE (*)    Bacteria, UA RARE (*)    All other components within normal limits  PROTIME-INR - Abnormal; Notable for the following components:   Prothrombin Time 41.6 (*)    INR 4.39 (*)    All other components within normal limits  CBG MONITORING, ED - Abnormal; Notable for the following components:   Glucose-Capillary 263 (*)    All other components within normal limits  URINE CULTURE  AEROBIC CULTURE (SUPERFICIAL SPECIMEN)  INFLUENZA PANEL BY PCR (TYPE A & B)  BRAIN NATRIURETIC PEPTIDE  I-STAT TROPONIN, ED  I-STAT CG4 LACTIC ACID, ED  I-STAT CG4 LACTIC ACID, ED    EKG  EKG Interpretation  Date/Time:  Monday July 19 2017 09:54:53 EST Ventricular Rate:  61 PR Interval:      QRS Duration: 198 QT Interval:  516 QTC Calculation: 519 R Axis:   -99 Text Interpretation:  Ventricular-paced rhythm Abnormal ECG Confirmed by Quintella Reichert [phone]) on 07/19/2017 10:07:56 AM       Radiology Dg Chest 2 View  Result Date: 07/19/2017 CLINICAL DATA:  Patient fell this morning. History of 1 week of sickness with weakness and instability. History of atrial fibrillation on anticoagulants. Abnormal lung sounds, some productive cough. EXAM: CHEST  2 VIEW COMPARISON:  PA and lateral chest x-ray of January 08, 2015 FINDINGS: The lungs are adequately inflated. The interstitial markings are increased. The cardiac silhouette is enlarged and the pulmonary vascularity is engorged. There is no alveolar pneumonia. There is no large pleural effusion. The ICD is in stable position. There is multilevel degenerative disc disease of the thoracic spine. There is mild degenerative change of the right shoulder. IMPRESSION: CHF with mild interstitial edema.  No alveolar pneumonia. Electronically Signed   By: David  Martinique M.D.   On: 07/19/2017 11:25   Dg Pelvis 1-2 Views  Result Date: 07/19/2017 CLINICAL DATA:  Fall, right hip abrasion EXAM: PELVIS - 1-2 VIEW COMPARISON:  None. FINDINGS: Right hip arthroplasty.  No evidence of complication. Left hip arthroplasty.  No evidence of complication. Surgical clips overlying the pelvis. Visualized bony pelvis appears intact. Degenerative changes of the lower lumbar spine. IMPRESSION: Bilateral hip arthroplasties, without evidence of complication. No fracture or dislocation is seen. Electronically Signed   By: Julian Hy M.D.   On: 07/19/2017 11:33   Ct Head Wo Contrast  Result Date: 07/19/2017 CLINICAL DATA:  Fall, head/C-spine trauma EXAM: CT HEAD WITHOUT CONTRAST CT CERVICAL SPINE  WITHOUT CONTRAST TECHNIQUE: Multidetector CT imaging of the head and cervical spine was performed following the standard protocol without intravenous contrast. Multiplanar CT  image reconstructions of the cervical spine were also generated. COMPARISON:  None. FINDINGS: CT HEAD FINDINGS Brain: No evidence of acute infarction, hemorrhage, hydrocephalus, extra-axial collection or mass lesion/mass effect. Subcortical white matter and periventricular small vessel ischemic changes. Vascular: Intracranial atherosclerosis. Skull: Normal. Negative for fracture or focal lesion. Sinuses/Orbits: Partial opacification of the right greater than left frontal sinuses. Near complete opacification of the right greater than left ethmoid sinuses. Partial opacification of the bilateral sphenoid sinuses. Near complete opacification of the right maxillary sinus. Partial opacification of the left maxillary sinus. Partial opacification of the right mastoid air cells. Other: Mild soft tissue swelling overlying the right parietal scalp (series 3/image 25). CT CERVICAL SPINE FINDINGS Alignment: Normal cervical lordosis. Skull base and vertebrae: No acute fracture. No primary bone lesion or focal pathologic process. Soft tissues and spinal canal: No prevertebral fluid or swelling. No visible canal hematoma. Multiple small bilateral cervical lymph nodes measuring up to 10 mm short axis, likely related to the patient's history of CLL. Disc levels:  Moderate multilevel degenerative changes. Spinal canal is patent. Upper chest: Visualized lung apices are clear. Other: Visualized thyroid is grossly unremarkable. IMPRESSION: Mild soft tissue swelling overlying the right parietal scalp. No evidence of acute intracranial abnormality. Small vessel ischemic changes. Extensive paranasal sinus disease, as above. No evidence of traumatic injury to the cervical spine. Moderate multilevel degenerative changes. Multiple small bilateral cervical lymph nodes measuring up to 10 mm short axis, likely related to the patient's history of CLL. Electronically Signed   By: Julian Hy M.D.   On: 07/19/2017 13:31   Ct Cervical Spine  Wo Contrast  Result Date: 07/19/2017 CLINICAL DATA:  Fall, head/C-spine trauma EXAM: CT HEAD WITHOUT CONTRAST CT CERVICAL SPINE WITHOUT CONTRAST TECHNIQUE: Multidetector CT imaging of the head and cervical spine was performed following the standard protocol without intravenous contrast. Multiplanar CT image reconstructions of the cervical spine were also generated. COMPARISON:  None. FINDINGS: CT HEAD FINDINGS Brain: No evidence of acute infarction, hemorrhage, hydrocephalus, extra-axial collection or mass lesion/mass effect. Subcortical white matter and periventricular small vessel ischemic changes. Vascular: Intracranial atherosclerosis. Skull: Normal. Negative for fracture or focal lesion. Sinuses/Orbits: Partial opacification of the right greater than left frontal sinuses. Near complete opacification of the right greater than left ethmoid sinuses. Partial opacification of the bilateral sphenoid sinuses. Near complete opacification of the right maxillary sinus. Partial opacification of the left maxillary sinus. Partial opacification of the right mastoid air cells. Other: Mild soft tissue swelling overlying the right parietal scalp (series 3/image 25). CT CERVICAL SPINE FINDINGS Alignment: Normal cervical lordosis. Skull base and vertebrae: No acute fracture. No primary bone lesion or focal pathologic process. Soft tissues and spinal canal: No prevertebral fluid or swelling. No visible canal hematoma. Multiple small bilateral cervical lymph nodes measuring up to 10 mm short axis, likely related to the patient's history of CLL. Disc levels:  Moderate multilevel degenerative changes. Spinal canal is patent. Upper chest: Visualized lung apices are clear. Other: Visualized thyroid is grossly unremarkable. IMPRESSION: Mild soft tissue swelling overlying the right parietal scalp. No evidence of acute intracranial abnormality. Small vessel ischemic changes. Extensive paranasal sinus disease, as above. No evidence of  traumatic injury to the cervical spine. Moderate multilevel degenerative changes. Multiple small bilateral cervical lymph nodes measuring up to 10 mm short axis, likely related to the patient's history  of CLL. Electronically Signed   By: Julian Hy M.D.   On: 07/19/2017 13:31   Dg Femur Min 2 Views Right  Result Date: 07/19/2017 CLINICAL DATA:  Status post fall, pain EXAM: RIGHT FEMUR 2 VIEWS COMPARISON:  None. FINDINGS: There is no evidence of fracture or other focal bone lesions. There is a right total hip arthroplasty without failure or complication. There is mild tricompartmental joint space narrowing of right knee. There is no right knee joint effusion. There is peripheral vascular atherosclerotic disease. Soft tissues are unremarkable. IMPRESSION: No acute osseous injury of the right femur. Electronically Signed   By: Kathreen Devoid   On: 07/19/2017 11:24   Ct Temporal Bones W Contrast  Result Date: 07/19/2017 CLINICAL DATA:  Fall. Abrasion to right side of head. Drainage from right ear. EXAM: CT TEMPORAL BONES WITH CONTRAST TECHNIQUE: Axial and coronal plane CT imaging of the petrous temporal bones was performed with thin-collimation image reconstruction after intravenous contrast administration. Multiplanar CT image reconstructions were also generated. CONTRAST:  62mL ISOVUE-300 IOPAMIDOL (ISOVUE-300) INJECTION 61% COMPARISON:  CT head without contrast from the same day. Limited sinus CT 04/08/2015. FINDINGS: Limited postcontrast imaging the brain is within normal limits. Globes and orbits are within normal limits. Chronic bilateral maxillary sinus disease is present, right greater than left. There is diffuse ethmoid and inferior frontal sinus disease. A right frontal sinus effusion is noted. There are fluid levels in the sphenoid sinuses bilaterally. The right external auditory canal is filled with soft tissue and fluid. There is fluid or soft tissue within the right middle ear canal,  surrounding the middle ear ossicles. Diffuse mastoid effusion is present. There is no associated fracture. The oval window is open. Inner ear structures are normally formed. Lateral and superior semicircular canals are covered. Facial nerve canal is covered. The internal auditory canal and vestibular aqueduct are normal. There is no coalescence of mastoid air cells or evidence for subperiosteal abscess. The left external auditory canal is clear. Tympanic membrane is visualized and normal. The left middle ear ossicles are normally formed and articulating. The oval window is patent. The mastoid air cells are clear. The inner ear structures are normally formed. Lateral and superior semicircular canals are covered. The facial nerve canal is covered. The internal auditory canal and vestibular aqueduct are normal. IMPRESSION: 1. Extensive soft tissue swelling in fluid in the right external auditory canal compatible with otitis externa. No bone destruction is present. 2. Right middle ear effusion and mastoid effusion is present as well. 3. No coalescence of mastoid air cells or subperiosteal abscess. 4. Left temporal bone is within normal limits. 5. Extensive acute on chronic sinus disease. Electronically Signed   By: San Morelle M.D.   On: 07/19/2017 13:43    Procedures .Marland KitchenLaceration Repair Date/Time: 07/19/2017 5:01 PM Performed by: Doristine Devoid, PA-C Authorized by: Doristine Devoid, PA-C   Consent:    Consent obtained:  Verbal   Consent given by:  Patient   Risks discussed:  Infection, need for additional repair, nerve damage, poor wound healing, poor cosmetic result, pain, retained foreign body, tendon damage and vascular damage   Alternatives discussed:  No treatment Anesthesia (see MAR for exact dosages):    Anesthesia method:  Topical application   Topical anesthetic:  LET Laceration details:    Location:  Scalp   Scalp location:  R parietal   Length (cm):  1 Repair type:     Repair type:  Simple Pre-procedure details:  Preparation:  Patient was prepped and draped in usual sterile fashion and imaging obtained to evaluate for foreign bodies Exploration:    Hemostasis achieved with:  Direct pressure   Wound exploration: wound explored through full range of motion and entire depth of wound probed and visualized     Wound extent: no foreign bodies/material noted, no muscle damage noted, no underlying fracture noted and no vascular damage noted     Contaminated: no   Treatment:    Area cleansed with:  Saline and Betadine   Amount of cleaning:  Standard   Irrigation solution:  Sterile saline   Irrigation method:  Pressure wash   Visualized foreign bodies/material removed: no   Skin repair:    Repair method:  Staples   Number of staples:  2 Approximation:    Approximation:  Close   Vermilion border: well-aligned   Post-procedure details:    Dressing:  Antibiotic ointment and non-adherent dressing   Patient tolerance of procedure:  Tolerated well, no immediate complications   (including critical care time)  Medications Ordered in ED Medications  neomycin-colistin-hydrocortisone-thonzonium (CORTISPORIN TC) OTIC (EAR) suspension 3 drop (not administered)  lidocaine-EPINEPHrine (XYLOCAINE-EPINEPHrine) 1 %-1:200000 (PF) injection 0-30 mL (not administered)  lidocaine (XYLOCAINE) 2 % jelly 1 application (not administered)  lidocaine (XYLOCAINE) 4 % external solution 0-50 mL (not administered)  silver nitrate applicators applicator 1 Stick (not administered)  oxymetazoline (AFRIN) 0.05 % nasal spray 1 spray (not administered)  TRIPLE ANTIBIOTIC [id number] OINT 1 application (not administered)  doxycycline (VIBRAMYCIN) 100 mg in dextrose 5 % 250 mL IVPB (not administered)  piperacillin-tazobactam (ZOSYN) IVPB 3.375 g (not administered)  iopamidol (ISOVUE-300) 61 % injection (75 mLs  Contrast Given 07/19/17 1200)  lidocaine-EPINEPHrine-tetracaine (LET) solution  (3 mLs Topical Given 07/19/17 1522)  lidocaine-EPINEPHrine (XYLOCAINE W/EPI) 2 %-1:200000 (PF) injection 10 mL (10 mLs Infiltration Given 07/19/17 1522)  Tdap (BOOSTRIX) injection 0.5 mL (0.5 mLs Intramuscular Given 07/19/17 1525)  acetaminophen (TYLENOL) tablet 650 mg (650 mg Oral Given 07/19/17 1522)     Initial Impression / Assessment and Plan / ED Course  I have reviewed the triage vital signs and the nursing notes.  Pertinent labs & imaging results that were available during my care of the patient were reviewed by me and considered in my medical decision making (see chart for details).     Patient presents to the ED for evaluation of mechanical fall along with cough, right ear drainage and generalized weakness.  Patient with history of CLL that is followed by oncology.  Also has a history of A. fib and on Coumadin.  On exam patient is overall well-appearing and nontoxic.  Vital signs are reassuring.  Patient is afebrile, no tachycardia, no hypotension noted.  Patient is not hypoxic and mild tachypnea is noted.  Patient does have a 1 cm laceration to the posterior occiput with bleeding controlled.  No skull depression noted.  The patient does have a significant amount of brown serous drainage from the right ear with an edematous ear canal unable to visualize the TM.  Patient has some erythema and pain over the mastoid region of the right ear with posterior auricular lymph node enlargement and erythema that extend the right side of the neck.  Lungs with rhonchorous sounds throughout.  Nose wheezing noted.  Mild increased work of breathing but no retractions.  No focal abdominal tenderness on palpation or signs of trauma.  Patient does have some pain over the right SI joint with manipulation and range of  motion however no signs of edema, ecchymosis noted.  Patient's lab work as notable for a leukocytosis of 56,000.  Patient with history of CLL this correlates with his disease process.  Patient's  troponin was negative.  Hemoglobin appears at baseline.  Electrolytes are reassuring.  Kidney function is normal.  Patient's INR is 4.3 patient on Coumadin.  Urine does show signs of infection with nitrites and RBCs along with WBCs and rare bacteria.  Urine culture pending.  Normal lactic acid.  Influenza pending.  I did obtain wound culture from the right ear drainage that is pending at this time.  She reveals mild interstitial edema without any focal concern for pneumonia.  EKG does not show any signs of ischemia or acute changes at this time.  Given patient's fall and on Coumadin CT head was ordered.  I did also perform a CT of the temporal region to rule out mastoiditis.  Patient's pelvis is stable without any signs of fracture.  CT head shows no acute intracranial bleeding.  Cervical spine shows no acute abnormalities X except for enlarged lymph nodes consistent with CLL.  Temporal bone CT scan does not show any signs of acute mastoiditis.  Does note otitis externa without any bony erosion.  First I spoke with Dr. Erik Obey with ENT.  He has recommended that a cover patient for Pseudomonas and MRSA.  He has recommended Cipro however patient has an extensive cardiac history including congestive heart failure.  Do not feel comfortable starting Cipro in this patient.  Will cover patient with IV Zosyn for Pseudomonas and doxycycline for MRSA coverage.  I obtain culture of the right ear.  Dr. Erik Obey will see patient in consultation.  I did place 3 staples of patient's laceration to his right occiput.  Irrigation was performed.  Tetanus was updated.  UA does show signs of infection however Zosyn will cover for this and culture is pending at this time.  Patient has significant pain to the right hip cannot bear weight on the right hip.  X-ray shows no signs of fracture however suspect possible occult fracture versus chronic findings.  This will be followed up in the hospital setting.  Given patient's fall risk  with his elevated INR 4.3 from the patient would benefit from admission and observation.  Patient was discussed with hospital medicine who agrees to admission and will see patient in the ED and place admission orders.  Patient remains hemodynamically stable this time.  He was updated on plan of care.  She was also seen and evaluated recent was agreed with the above plan.    Final Clinical Impressions(s) / ED Diagnoses   Final diagnoses:  Fall, initial encounter  Laceration of scalp, initial encounter  Otitis externa of right ear, unspecified chronicity, unspecified type  Right hip pain  Cough  SOB (shortness of breath)    ED Discharge Orders    None       Aaron Edelman 07/19/17 1708    Quintella Reichert, MD 07/23/17 1252

## 2017-07-19 NOTE — ED Notes (Signed)
RN attempted to call report; RN to call back; 

## 2017-07-19 NOTE — H&P (Signed)
History and Physical    STEPHANIE MCGLONE RSW:[id number] DOB: May 29, 1933 DOA: 07/19/2017  PCP: [name], MD   Patient coming from: Home  Chief Complaint: fall and bleeding  HPI: Oscar Baker is a 84 y.o. male with medical history significant of atrial fibrillation on warfarin, CLL not on any treatment, sick sinus syndrome status post pacer, prostate cancer status post prostatectomy, type 2 diabetes on oral hypoglycemics, bilateral hip replacements who comes in after mechanical fall at home, ear drainage for several days, and sinus congestion and shortness of breath.  Patient reports that for the past 3 weeks he has had sinus congestion and significant postnasal drip.  He also reports increased cough that is initially clear but occasionally also yellowish.  He has not had any fevers however his wife did record a temperature of 100.0 approximately 2 weeks ago.  He continues to have sinus congestion and nasal congestion as well.  Approximately 4 days ago he began to notice significant hearing loss on the right side.  He had does carry a diagnosis of presbycusis however this was new onset profound hearing loss on the right side that he and his wife noted.  He also began to have pain in that ear and then spontaneously began draining clear serosanguineous fluid from that ear. Today he was sitting in his chair when he stood up and noticed some right-sided hip weakness which caused him to fall onto that right hip.  He also hit his head and had significant amounts of bleeding during that time.  His INR was a little bit above 4 as they checked it today.  He denied any loss of consciousness chest pain palpitations, lower extremity edema, orthopnea, PND.  He denied any fevers, nausea vomiting, diarrhea, rash.  ED Course: In the ED patient was noted to have Vitals that were relatively reassuring.  Labs were notable for mild hyponatremia, and CBC was notable for white count of 56.1, hemoglobin of 10.4 which was  down from 11 2 months ago, and platelets of 445 which was increased.  Chest x-ray showed CHF with mild edema.  Right hip x-ray showed no evidence of fracture.  Head CT showed only some right parietal scalp swelling and multiple small axillary lymph nodes.  CT of the temporal bones showed extensive swelling on the right external auditory canal consistent with otitis externa.  He was also noted to be a right middle ear effusion and mastoid effusion as well.  ENT was consulted due to concern of malignant otitis externa versus mastoiditis in the setting of ear infection in a cancer patient.  ENT felt like mastoiditis at this time was not likely.  However they did recommend treatment for both MRSA and Pseudomonas.  Patient was started on doxycycline IV as well as Zosyn IV.  Patient scalp was stapled closed.  Review of Systems: As per HPI otherwise 10 point review of systems negative.    Past Medical History:  Diagnosis Date  . CLL (chronic lymphocytic leukemia) ([place])   . Diabetes mellitus type 2, noninsulin dependent ([place])   . Hyperlipidemia   . Hypertension   . Pacemaker 02/08/2014   DR Lovena Le  . prostate ca dx'd 2006   xrt; prostatectomy  . Sick sinus syndrome [facility])    a-Flutter with episodes of bradycardia; S/P Biotronik (serial number [id number])    Past Surgical History:  Procedure Laterality Date  . APPENDECTOMY    . BACK SURGERY     disk  . INSERT / REPLACE /  REMOVE PACEMAKER  02/08/2014    DR Lovena Le  . PACEMAKER INSERTION  02/08/2014   Biotronik (serial number [id number])  . PERMANENT PACEMAKER INSERTION N/A 02/08/2014   Procedure: PERMANENT PACEMAKER INSERTION;  Surgeon: Evans Lance, MD;  Location: [facility] CATH LAB;  Service: Cardiovascular;  Laterality: N/A;  . TONSILLECTOMY    . TOTAL HIP ARTHROPLASTY Left 05/12/2016   Procedure: LEFT TOTAL HIP ARTHROPLASTY ANTERIOR APPROACH;  Surgeon: Paralee Cancel, MD;  Location: [facility] ORS;  Service: Orthopedics;  Laterality: Left;  . TOTAL KNEE  ARTHROPLASTY       reports that he quit smoking about 41 years ago. His smoking use included pipe and cigars. He quit after 25.00 years of use. he has never used smokeless tobacco. He reports that he drinks alcohol. He reports that he does not use drugs.  Allergies  Allergen Reactions  . Altace [Ramipril] Other (See Comments)    "throat felt like had a knot in it"  . Codeine Nausea And Vomiting    Nausea and vomiting   . Simvastatin Other (See Comments)    Leg aches    Family History  Problem Relation Age of Onset  . Emphysema Mother   . Allergic rhinitis Mother   . Heart attack Father   . Allergic rhinitis Brother   . Pulmonary fibrosis Brother    Unacceptable: Noncontributory, unremarkable, or negative. Acceptable: Family history reviewed and not pertinent (If you reviewed it)  Prior to Admission medications   Medication Sig Start Date End Date Taking? Authorizing Provider  acetaminophen (TYLENOL) 500 MG tablet Take 1,000 mg by mouth as needed for mild pain.   Yes [provider]  aspirin 81 MG tablet Take 81 mg by mouth at bedtime. Will stop prior to surgery   Yes [provider]  atorvastatin (LIPITOR) 20 MG tablet TAKE 1 TABLET BY MOUTH DAILY AT 6PM 06/01/17  Yes Nahser, Wonda Cheng, MD  docusate sodium (COLACE) 100 MG capsule Take 1 capsule (100 mg total) by mouth 2 (two) times daily. 05/13/16  Yes Babish, Rodman Key, PA-C  fenofibrate micronized (LOFIBRA) 200 MG capsule TAKE 1 CAPSULE BY MOUTH DAILY BEFORE BREAKFAST 06/29/17  Yes Nahser, Wonda Cheng, MD  fluticasone (FLONASE) 50 MCG/ACT nasal spray Place 1 spray into both nostrils as needed for allergies or rhinitis.   Yes [provider]  glipiZIDE (GLUCOTROL XL) 2.5 MG 24 hr tablet Take 2.5 mg by mouth daily with breakfast.   Yes [provider]  JANUVIA 100 MG tablet Take 100 mg by mouth at bedtime.  02/27/17  Yes [provider]  metFORMIN (GLUCOPHAGE) 1000 MG tablet Take 1,000 mg by  mouth 2 (two) times daily with a meal.     Yes [provider]  multivitamin (THERAGRAN) per tablet Take 1 tablet by mouth daily. Will stop prior to procedure   Yes [provider]  vitamin B-12 (CYANOCOBALAMIN) 1000 MCG tablet Take 1,000 mcg by mouth daily. Will stop prior to procedure   Yes [provider]  warfarin (COUMADIN) 5 MG tablet TAKE AS DIRECTED BY COUMADIN CLINIC 06/02/17  Yes Nahser, Wonda Cheng, MD  amLODipine (NORVASC) 10 MG tablet Take 10 mg by mouth at bedtime.  04/28/14   [provider]    Physical Exam: Vitals:   07/19/17 1430 07/19/17 1445 07/19/17 1500 07/19/17 1515  BP: (!) 124/58 (!) 130/54 (!) 116/92 (!) 122/50  Pulse: (!) 58 66 77 (!) 59  Resp: (!) 23 (!) 30 (!) 32 (!) 21  Temp:      TempSrc:      SpO2: 94% 94% 94% 95%  Weight:      Height:        Constitutional: NAD, calm, comfortable Vitals:   07/19/17 1430 07/19/17 1445 07/19/17 1500 07/19/17 1515  BP: (!) 124/58 (!) 130/54 (!) 116/92 (!) 122/50  Pulse: (!) 58 66 77 (!) 59  Resp: (!) 23 (!) 30 (!) 32 (!) 21  Temp:      TempSrc:      SpO2: 94% 94% 94% 95%  Weight:      Height:       Eyes: PERRL, lids and conjunctivae normal ENMT: Mucous membranes are moist. Noted to have debris in R ear canal with small pinhole noted in opaque TM Neck: Shotty cervical lymphadenopathy Respiratory: No increased work of breathing, with scattered rhonchi and some crackles at bases Cardiovascular: Regular rate and rhythm, no murmurs / rubs / gallops.  Pacer site noted  abdomen: no tenderness, no masses palpated. No hepatosplenomegaly. Bowel sounds positive.  Musculoskeletal: No edema, pain with internal or external rotation of right hip skin: no rashes on visible skin Psychiatric: Normal judgment and insight. Alert and oriented x 3. Normal mood.    Labs on Admission: I have personally reviewed following labs and imaging studies  CBC: Recent Labs  Lab 07/19/17 1030  WBC 56.1*    NEUTROABS 22.4*  HGB 10.4*  HCT 31.2*  MCV 96.3  PLT 400*   Basic Metabolic Panel: Recent Labs  Lab 07/19/17 1030  NA 130*  K 4.4  CL 100*  CO2 18*  GLUCOSE 294*  BUN 13  CREATININE 0.89  CALCIUM 8.2*   GFR: Estimated Creatinine Clearance: 76.3 mL/min (by C-G formula based on SCr of 0.89 mg/dL). Liver Function Tests: Recent Labs  Lab 07/19/17 1030  AST 26  ALT 19  ALKPHOS 71  BILITOT 1.0  PROT 5.5*  ALBUMIN 2.4*   No results for input(s): LIPASE, AMYLASE in the last 168 hours. No results for input(s): AMMONIA in the last 168 hours. Coagulation Profile: Recent Labs  Lab 07/13/17 0910 07/19/17 1030  INR 2.4 4.39*   Cardiac Enzymes: No results for input(s): CKTOTAL, CKMB, CKMBINDEX, TROPONINI in the last 168 hours. BNP (last 3 results) No results for input(s): PROBNP in the last 8760 hours. HbA1C: No results for input(s): HGBA1C in the last 72 hours. CBG: Recent Labs  Lab 07/19/17 0950  GLUCAP 263*   Lipid Profile: No results for input(s): CHOL, HDL, LDLCALC, TRIG, CHOLHDL, LDLDIRECT in the last 72 hours. Thyroid Function Tests: No results for input(s): TSH, T4TOTAL, FREET4, T3FREE, THYROIDAB in the last 72 hours. Anemia Panel: No results for input(s): VITAMINB12, FOLATE, FERRITIN, TIBC, IRON, RETICCTPCT in the last 72 hours. Urine analysis:    Component Value Date/Time   COLORURINE YELLOW 07/19/2017 1545   APPEARANCEUR HAZY (A) 07/19/2017 1545   LABSPEC 1.044 (H) 07/19/2017 1545   PHURINE 6.0 07/19/2017 1545   GLUCOSEU >=500 (A) 07/19/2017 1545   HGBUR LARGE (A) 07/19/2017 1545   BILIRUBINUR NEGATIVE 07/19/2017 1545   KETONESUR 20 (A) 07/19/2017 1545   PROTEINUR 30 (A) 07/19/2017 1545   NITRITE POSITIVE (A) 07/19/2017 1545   LEUKOCYTESUR NEGATIVE 07/19/2017 1545    Radiological Exams on Admission: Dg Chest 2 View  Result Date: 07/19/2017 CLINICAL DATA:  Patient fell this morning. History of 1 week of sickness with weakness and instability.  History of atrial fibrillation on anticoagulants. Abnormal lung sounds, some productive cough. EXAM:  CHEST  2 VIEW COMPARISON:  PA and lateral chest x-ray of January 08, 2015 FINDINGS: The lungs are adequately inflated. The interstitial markings are increased. The cardiac silhouette is enlarged and the pulmonary vascularity is engorged. There is no alveolar pneumonia. There is no large pleural effusion. The ICD is in stable position. There is multilevel degenerative disc disease of the thoracic spine. There is mild degenerative change of the right shoulder. IMPRESSION: CHF with mild interstitial edema.  No alveolar pneumonia. Electronically Signed   By: David  Martinique M.D.   On: 07/19/2017 11:25   Dg Pelvis 1-2 Views  Result Date: 07/19/2017 CLINICAL DATA:  Fall, right hip abrasion EXAM: PELVIS - 1-2 VIEW COMPARISON:  None. FINDINGS: Right hip arthroplasty.  No evidence of complication. Left hip arthroplasty.  No evidence of complication. Surgical clips overlying the pelvis. Visualized bony pelvis appears intact. Degenerative changes of the lower lumbar spine. IMPRESSION: Bilateral hip arthroplasties, without evidence of complication. No fracture or dislocation is seen. Electronically Signed   By: Julian Hy M.D.   On: 07/19/2017 11:33   Ct Head Wo Contrast  Result Date: 07/19/2017 CLINICAL DATA:  Fall, head/C-spine trauma EXAM: CT HEAD WITHOUT CONTRAST CT CERVICAL SPINE WITHOUT CONTRAST TECHNIQUE: Multidetector CT imaging of the head and cervical spine was performed following the standard protocol without intravenous contrast. Multiplanar CT image reconstructions of the cervical spine were also generated. COMPARISON:  None. FINDINGS: CT HEAD FINDINGS Brain: No evidence of acute infarction, hemorrhage, hydrocephalus, extra-axial collection or mass lesion/mass effect. Subcortical white matter and periventricular small vessel ischemic changes. Vascular: Intracranial atherosclerosis. Skull: Normal. Negative  for fracture or focal lesion. Sinuses/Orbits: Partial opacification of the right greater than left frontal sinuses. Near complete opacification of the right greater than left ethmoid sinuses. Partial opacification of the bilateral sphenoid sinuses. Near complete opacification of the right maxillary sinus. Partial opacification of the left maxillary sinus. Partial opacification of the right mastoid air cells. Other: Mild soft tissue swelling overlying the right parietal scalp (series 3/image 25). CT CERVICAL SPINE FINDINGS Alignment: Normal cervical lordosis. Skull base and vertebrae: No acute fracture. No primary bone lesion or focal pathologic process. Soft tissues and spinal canal: No prevertebral fluid or swelling. No visible canal hematoma. Multiple small bilateral cervical lymph nodes measuring up to 10 mm short axis, likely related to the patient's history of CLL. Disc levels:  Moderate multilevel degenerative changes. Spinal canal is patent. Upper chest: Visualized lung apices are clear. Other: Visualized thyroid is grossly unremarkable. IMPRESSION: Mild soft tissue swelling overlying the right parietal scalp. No evidence of acute intracranial abnormality. Small vessel ischemic changes. Extensive paranasal sinus disease, as above. No evidence of traumatic injury to the cervical spine. Moderate multilevel degenerative changes. Multiple small bilateral cervical lymph nodes measuring up to 10 mm short axis, likely related to the patient's history of CLL. Electronically Signed   By: Julian Hy M.D.   On: 07/19/2017 13:31   Ct Cervical Spine Wo Contrast  Result Date: 07/19/2017 CLINICAL DATA:  Fall, head/C-spine trauma EXAM: CT HEAD WITHOUT CONTRAST CT CERVICAL SPINE WITHOUT CONTRAST TECHNIQUE: Multidetector CT imaging of the head and cervical spine was performed following the standard protocol without intravenous contrast. Multiplanar CT image reconstructions of the cervical spine were also generated.  COMPARISON:  None. FINDINGS: CT HEAD FINDINGS Brain: No evidence of acute infarction, hemorrhage, hydrocephalus, extra-axial collection or mass lesion/mass effect. Subcortical white matter and periventricular small vessel ischemic changes. Vascular: Intracranial atherosclerosis. Skull: Normal. Negative for fracture or  focal lesion. Sinuses/Orbits: Partial opacification of the right greater than left frontal sinuses. Near complete opacification of the right greater than left ethmoid sinuses. Partial opacification of the bilateral sphenoid sinuses. Near complete opacification of the right maxillary sinus. Partial opacification of the left maxillary sinus. Partial opacification of the right mastoid air cells. Other: Mild soft tissue swelling overlying the right parietal scalp (series 3/image 25). CT CERVICAL SPINE FINDINGS Alignment: Normal cervical lordosis. Skull base and vertebrae: No acute fracture. No primary bone lesion or focal pathologic process. Soft tissues and spinal canal: No prevertebral fluid or swelling. No visible canal hematoma. Multiple small bilateral cervical lymph nodes measuring up to 10 mm short axis, likely related to the patient's history of CLL. Disc levels:  Moderate multilevel degenerative changes. Spinal canal is patent. Upper chest: Visualized lung apices are clear. Other: Visualized thyroid is grossly unremarkable. IMPRESSION: Mild soft tissue swelling overlying the right parietal scalp. No evidence of acute intracranial abnormality. Small vessel ischemic changes. Extensive paranasal sinus disease, as above. No evidence of traumatic injury to the cervical spine. Moderate multilevel degenerative changes. Multiple small bilateral cervical lymph nodes measuring up to 10 mm short axis, likely related to the patient's history of CLL. Electronically Signed   By: Julian Hy M.D.   On: 07/19/2017 13:31   Dg Femur Min 2 Views Right  Result Date: 07/19/2017 CLINICAL DATA:  Status post  fall, pain EXAM: RIGHT FEMUR 2 VIEWS COMPARISON:  None. FINDINGS: There is no evidence of fracture or other focal bone lesions. There is a right total hip arthroplasty without failure or complication. There is mild tricompartmental joint space narrowing of right knee. There is no right knee joint effusion. There is peripheral vascular atherosclerotic disease. Soft tissues are unremarkable. IMPRESSION: No acute osseous injury of the right femur. Electronically Signed   By: Kathreen Devoid   On: 07/19/2017 11:24   Ct Temporal Bones W Contrast  Result Date: 07/19/2017 CLINICAL DATA:  Fall. Abrasion to right side of head. Drainage from right ear. EXAM: CT TEMPORAL BONES WITH CONTRAST TECHNIQUE: Axial and coronal plane CT imaging of the petrous temporal bones was performed with thin-collimation image reconstruction after intravenous contrast administration. Multiplanar CT image reconstructions were also generated. CONTRAST:  68mL ISOVUE-300 IOPAMIDOL (ISOVUE-300) INJECTION 61% COMPARISON:  CT head without contrast from the same day. Limited sinus CT 04/08/2015. FINDINGS: Limited postcontrast imaging the brain is within normal limits. Globes and orbits are within normal limits. Chronic bilateral maxillary sinus disease is present, right greater than left. There is diffuse ethmoid and inferior frontal sinus disease. A right frontal sinus effusion is noted. There are fluid levels in the sphenoid sinuses bilaterally. The right external auditory canal is filled with soft tissue and fluid. There is fluid or soft tissue within the right middle ear canal, surrounding the middle ear ossicles. Diffuse mastoid effusion is present. There is no associated fracture. The oval window is open. Inner ear structures are normally formed. Lateral and superior semicircular canals are covered. Facial nerve canal is covered. The internal auditory canal and vestibular aqueduct are normal. There is no coalescence of mastoid air cells or evidence  for subperiosteal abscess. The left external auditory canal is clear. Tympanic membrane is visualized and normal. The left middle ear ossicles are normally formed and articulating. The oval window is patent. The mastoid air cells are clear. The inner ear structures are normally formed. Lateral and superior semicircular canals are covered. The facial nerve canal is covered. The internal auditory  canal and vestibular aqueduct are normal. IMPRESSION: 1. Extensive soft tissue swelling in fluid in the right external auditory canal compatible with otitis externa. No bone destruction is present. 2. Right middle ear effusion and mastoid effusion is present as well. 3. No coalescence of mastoid air cells or subperiosteal abscess. 4. Left temporal bone is within normal limits. 5. Extensive acute on chronic sinus disease. Electronically Signed   By: San Morelle M.D.   On: 07/19/2017 13:43    EKG: Independently reviewed.  Paced rhythm  Assessment/Plan Active Problems:   Malignant otitis externa   Fall at home, initial encounter   Acute on chronic diastolic CHF (congestive heart failure) ([place])    ##) Fall with elevated INR: Currently appears to be mechanical fall. -PT evaluation - Hold warfarin, no need for reversal at this time -Case management for possible rehab -We will consider CT of the hip if high suspicion for fracture.  Patient cannot obtain MRI due to ferromagnetic pacer  ##) Mild likely diastolic heart failure exacerbation: - Will obtain echo - We will give furosemide IV 40 mg twice daily -2 g sodium restricted diet, strict ins and outs, 2 L fluid restriction, weight daily  ##) Otitis externa: His exam and imaging is all consistent with relatively mild otitis externa.  I have low suspicion for either MRSA or Pseudomonas at this time.  We will consider culturing drainage of fluid from the ear however suspect that mostly will be skin organisms. -ENT consult appreciate  recommendations -Continue Zosyn and doxycycline IV at this time  ##) Atrial fibrillation complicated by sick sinus syndrome status post pacer on warfarin: -Hold warfarin -Telemetry  ##) CLL: Likely row stage 0 as patient is not merit any treatment.  He has minimal lymphadenopathy and no evidence of other organ involvement.  His platelets are if anything high.    ##) Type 2 diabetes: -Sliding scale insulin, before meals at bedtime -Hold home oral meds  ##) Benign prostatic hypertrophy: -Continue home meds  ##) Prostate cancer now with rising PSA: Low suspicion for metastatic prostatic disease to his bones at this time.  However if he continues to have severe bone pain or further fractures noted on imaging he might merit further workup for bony metastases.  FEN: -Fluids: restrict - electrolytes: monitor and supp - nutrition: 2gNa restricted diet  Prophy: elevated INR  DIspo: pending PT recs  DNR  Cristy Folks MD [facility]  If 7PM-7AM, please contact night-coverage [email or web address] Password [facility]  07/19/2017, 5:48 PM

## 2017-07-19 NOTE — ED Notes (Signed)
Let applied

## 2017-07-19 NOTE — ED Triage Notes (Signed)
Per [facility], Pt is coming from home where he was noted to have a fall this moment on the right hip and abrasion noted to the right head. Pt reports being sick for one week and got up this morning. Reports being weak and falling. Hx of Afib and Blood Thinner. Noted to have Rhonchi upon assessment and reported gray sputum along with vomiting. CBG 336 per EMS

## 2017-07-19 NOTE — Consult Note (Signed)
Oscar Baker, Oscar Baker 84 y.o., male [id number]     Chief Complaint:   RIGHT ear pain and drainage  HPI:  84 yo wm has felt poorly all week.  Has had facial pressure and colorful anterior and posterior drainage.  Hearing has been poor, RIGHT>LEFT.  Fell this morning, but not obviously from LOC.  Has sick sinus syndrome, A fib, Type II DM, prostate Ca.  Occ sinus infection.  Rare ear infection.    CT scan in ED showed diffuse opacification of paranasal sinuses.  Fluid in RIGHT mastoid and middle ear.  Swelling and fluid in RIGHT canal.  LEFT ear clear.    Discussed care with ED physician earlier today, including ear culture, topical Cortisporin Otic Susp, and IV coverage to include MRSA and Pseudomonas.    PMH: Past Medical History:  Diagnosis Date  . CLL (chronic lymphocytic leukemia) ([place])   . Diabetes mellitus type 2, noninsulin dependent ([place])   . Hyperlipidemia   . Hypertension   . Pacemaker 02/08/2014   DR Lovena Le  . prostate ca dx'd 2006   xrt; prostatectomy  . Sick sinus syndrome ([facility])    a-Flutter with episodes of bradycardia; S/P Biotronik (serial number [id number])    Surg Hx: Past Surgical History:  Procedure Laterality Date  . APPENDECTOMY    . BACK SURGERY     disk  . INSERT / REPLACE / REMOVE PACEMAKER  02/08/2014    DR Lovena Le  . PACEMAKER INSERTION  02/08/2014   Biotronik (serial number [id number])  . PERMANENT PACEMAKER INSERTION N/A 02/08/2014   Procedure: PERMANENT PACEMAKER INSERTION;  Surgeon: Evans Lance, MD;  Location: [facility] CATH LAB;  Service: Cardiovascular;  Laterality: N/A;  . TONSILLECTOMY    . TOTAL HIP ARTHROPLASTY Left 05/12/2016   Procedure: LEFT TOTAL HIP ARTHROPLASTY ANTERIOR APPROACH;  Surgeon: Paralee Cancel, MD;  Location: [facility] ORS;  Service: Orthopedics;  Laterality: Left;  . TOTAL KNEE ARTHROPLASTY      FHx:   Family History  Problem Relation Age of Onset  . Emphysema Mother   . Allergic rhinitis Mother   . Heart attack Father   . Allergic rhinitis  Brother   . Pulmonary fibrosis Brother    SocHx:  reports that he quit smoking about 41 years ago. His smoking use included pipe and cigars. He quit after 25.00 years of use. he has never used smokeless tobacco. He reports that he drinks alcohol. He reports that he does not use drugs.  ALLERGIES:  Allergies  Allergen Reactions  . Altace [Ramipril] Other (See Comments)    "throat felt like had a knot in it"  . Codeine Nausea And Vomiting    Nausea and vomiting   . Simvastatin Other (See Comments)    Leg aches     (Not in a hospital admission)  Results for orders placed or performed during the hospital encounter of 07/19/17 (from the past 48 hour(s))  CBG monitoring, ED     Status: Abnormal   Collection Time: 07/19/17  9:50 AM  Result Value Ref Range   Glucose-Capillary 263 (H) 65 - 99 mg/dL  Comprehensive metabolic panel     Status: Abnormal   Collection Time: 07/19/17 10:30 AM  Result Value Ref Range   Sodium 130 (L) 135 - 145 mmol/L   Potassium 4.4 3.5 - 5.1 mmol/L   Chloride 100 (L) 101 - 111 mmol/L   CO2 18 (L) 22 - 32 mmol/L   Glucose, Bld 294 (H) 65 - 99 mg/dL  BUN 13 6 - 20 mg/dL   Creatinine, Ser 0.89 0.61 - 1.24 mg/dL   Calcium 8.2 (L) 8.9 - 10.3 mg/dL   Total Protein 5.5 (L) 6.5 - 8.1 g/dL   Albumin 2.4 (L) 3.5 - 5.0 g/dL   AST 26 15 - 41 U/L   ALT 19 17 - 63 U/L   Alkaline Phosphatase 71 38 - 126 U/L   Total Bilirubin 1.0 0.3 - 1.2 mg/dL   GFR calc non Af Amer >60 >60 mL/min   GFR calc Af Amer >60 >60 mL/min    Comment: (NOTE) The eGFR has been calculated using the CKD EPI equation. This calculation has not been validated in all clinical situations. eGFR's persistently <60 mL/min signify possible Chronic Kidney Disease.    Anion gap 12 5 - 15  CBC with Differential     Status: Abnormal   Collection Time: 07/19/17 10:30 AM  Result Value Ref Range   WBC 56.1 (HH) 4.0 - 10.5 K/uL    Comment: REPEATED TO VERIFY CRITICAL RESULT CALLED TO, READ BACK BY AND  VERIFIED WITH: EMILY ATKINS,RN AT 1126 07/19/17 BY ZBEECH.    RBC 3.24 (L) 4.22 - 5.81 MIL/uL   Hemoglobin 10.4 (L) 13.0 - 17.0 g/dL   HCT 31.2 (L) 39.0 - 52.0 %   MCV 96.3 78.0 - 100.0 fL   MCH 32.1 26.0 - 34.0 pg   MCHC 33.3 30.0 - 36.0 g/dL   RDW 18.2 (H) 11.5 - 15.5 %   Platelets 445 (H) 150 - 400 K/uL   Neutrophils Relative % 30 %   Lymphocytes Relative 55 %   Monocytes Relative 5 %   Eosinophils Relative 0 %   Basophils Relative 0 %   Band Neutrophils 8 %   Metamyelocytes Relative 1 %   Myelocytes 1 %   Promyelocytes Absolute 0 %   Blasts 0 %   nRBC 0 0 /100 WBC   Other 0 %   Neutro Abs 22.4 (H) 1.7 - 7.7 K/uL   Lymphs Abs 30.9 (H) 0.7 - 4.0 K/uL   Monocytes Absolute 2.8 (H) 0.1 - 1.0 K/uL   Eosinophils Absolute 0.0 0.0 - 0.7 K/uL   Basophils Absolute 0.0 0.0 - 0.1 K/uL   WBC Morphology MILD LEFT SHIFT (1-5% METAS, OCC MYELO, OCC BANDS)     Comment: MODERATE LEFT SHIFT (>5% METAS AND MYELOS,OCC PRO NOTED) TOXIC GRANULATION   Protime-INR     Status: Abnormal   Collection Time: 07/19/17 10:30 AM  Result Value Ref Range   Prothrombin Time 41.6 (H) 11.4 - 15.2 seconds    Comment: REPEATED TO VERIFY   INR 4.39 (HH)     Comment: REPEATED TO VERIFY CRITICAL RESULT CALLED TO, READ BACK BY AND VERIFIED WITHDaleen Squibb [id number] WILDERK   I-stat troponin, ED     Status: None   Collection Time: 07/19/17 10:49 AM  Result Value Ref Range   Troponin i, poc 0.00 0.00 - 0.08 ng/mL   Comment 3            Comment: Due to the release kinetics of cTnI, a negative result within the first hours of the onset of symptoms does not rule out myocardial infarction with certainty. If myocardial infarction is still suspected, repeat the test at appropriate intervals.   I-Stat CG4 Lactic Acid, ED     Status: None   Collection Time: 07/19/17 10:52 AM  Result Value Ref Range   Lactic Acid, Venous 1.22 0.5 -  1.9 mmol/L  Influenza panel by PCR (type A & B)     Status: None    Collection Time: 07/19/17  3:35 PM  Result Value Ref Range   Influenza A By PCR NEGATIVE NEGATIVE   Influenza B By PCR NEGATIVE NEGATIVE    Comment: (NOTE) The Xpert Xpress Flu assay is intended as an aid in the diagnosis of  influenza and should not be used as a sole basis for treatment.  This  assay is FDA approved for nasopharyngeal swab specimens only. Nasal  washings and aspirates are unacceptable for Xpert Xpress Flu testing.   Urinalysis, Routine w reflex microscopic     Status: Abnormal   Collection Time: 07/19/17  3:45 PM  Result Value Ref Range   Color, Urine YELLOW YELLOW   APPearance HAZY (A) CLEAR   Specific Gravity, Urine 1.044 (H) 1.005 - 1.030   pH 6.0 5.0 - 8.0   Glucose, UA >=500 (A) NEGATIVE mg/dL   Hgb urine dipstick LARGE (A) NEGATIVE   Bilirubin Urine NEGATIVE NEGATIVE   Ketones, ur 20 (A) NEGATIVE mg/dL   Protein, ur 30 (A) NEGATIVE mg/dL   Nitrite POSITIVE (A) NEGATIVE   Leukocytes, UA NEGATIVE NEGATIVE   RBC / HPF TOO NUMEROUS TO COUNT 0 - 5 RBC/hpf   WBC, UA 0-5 0 - 5 WBC/hpf   Bacteria, UA RARE (A) NONE SEEN   Squamous Epithelial / LPF NONE SEEN NONE SEEN   Mucus PRESENT    Dg Chest 2 View  Result Date: 07/19/2017 CLINICAL DATA:  Patient fell this morning. History of 1 week of sickness with weakness and instability. History of atrial fibrillation on anticoagulants. Abnormal lung sounds, some productive cough. EXAM: CHEST  2 VIEW COMPARISON:  PA and lateral chest x-ray of January 08, 2015 FINDINGS: The lungs are adequately inflated. The interstitial markings are increased. The cardiac silhouette is enlarged and the pulmonary vascularity is engorged. There is no alveolar pneumonia. There is no large pleural effusion. The ICD is in stable position. There is multilevel degenerative disc disease of the thoracic spine. There is mild degenerative change of the right shoulder. IMPRESSION: CHF with mild interstitial edema.  No alveolar pneumonia. Electronically Signed    By: David  Martinique M.D.   On: 07/19/2017 11:25   Dg Pelvis 1-2 Views  Result Date: 07/19/2017 CLINICAL DATA:  Fall, right hip abrasion EXAM: PELVIS - 1-2 VIEW COMPARISON:  None. FINDINGS: Right hip arthroplasty.  No evidence of complication. Left hip arthroplasty.  No evidence of complication. Surgical clips overlying the pelvis. Visualized bony pelvis appears intact. Degenerative changes of the lower lumbar spine. IMPRESSION: Bilateral hip arthroplasties, without evidence of complication. No fracture or dislocation is seen. Electronically Signed   By: Julian Hy M.D.   On: 07/19/2017 11:33   Ct Head Wo Contrast  Result Date: 07/19/2017 CLINICAL DATA:  Fall, head/C-spine trauma EXAM: CT HEAD WITHOUT CONTRAST CT CERVICAL SPINE WITHOUT CONTRAST TECHNIQUE: Multidetector CT imaging of the head and cervical spine was performed following the standard protocol without intravenous contrast. Multiplanar CT image reconstructions of the cervical spine were also generated. COMPARISON:  None. FINDINGS: CT HEAD FINDINGS Brain: No evidence of acute infarction, hemorrhage, hydrocephalus, extra-axial collection or mass lesion/mass effect. Subcortical white matter and periventricular small vessel ischemic changes. Vascular: Intracranial atherosclerosis. Skull: Normal. Negative for fracture or focal lesion. Sinuses/Orbits: Partial opacification of the right greater than left frontal sinuses. Near complete opacification of the right greater than left ethmoid sinuses. Partial opacification of  the bilateral sphenoid sinuses. Near complete opacification of the right maxillary sinus. Partial opacification of the left maxillary sinus. Partial opacification of the right mastoid air cells. Other: Mild soft tissue swelling overlying the right parietal scalp (series 3/image 25). CT CERVICAL SPINE FINDINGS Alignment: Normal cervical lordosis. Skull base and vertebrae: No acute fracture. No primary bone lesion or focal pathologic  process. Soft tissues and spinal canal: No prevertebral fluid or swelling. No visible canal hematoma. Multiple small bilateral cervical lymph nodes measuring up to 10 mm short axis, likely related to the patient's history of CLL. Disc levels:  Moderate multilevel degenerative changes. Spinal canal is patent. Upper chest: Visualized lung apices are clear. Other: Visualized thyroid is grossly unremarkable. IMPRESSION: Mild soft tissue swelling overlying the right parietal scalp. No evidence of acute intracranial abnormality. Small vessel ischemic changes. Extensive paranasal sinus disease, as above. No evidence of traumatic injury to the cervical spine. Moderate multilevel degenerative changes. Multiple small bilateral cervical lymph nodes measuring up to 10 mm short axis, likely related to the patient's history of CLL. Electronically Signed   By: Julian Hy M.D.   On: 07/19/2017 13:31   Ct Cervical Spine Wo Contrast  Result Date: 07/19/2017 CLINICAL DATA:  Fall, head/C-spine trauma EXAM: CT HEAD WITHOUT CONTRAST CT CERVICAL SPINE WITHOUT CONTRAST TECHNIQUE: Multidetector CT imaging of the head and cervical spine was performed following the standard protocol without intravenous contrast. Multiplanar CT image reconstructions of the cervical spine were also generated. COMPARISON:  None. FINDINGS: CT HEAD FINDINGS Brain: No evidence of acute infarction, hemorrhage, hydrocephalus, extra-axial collection or mass lesion/mass effect. Subcortical white matter and periventricular small vessel ischemic changes. Vascular: Intracranial atherosclerosis. Skull: Normal. Negative for fracture or focal lesion. Sinuses/Orbits: Partial opacification of the right greater than left frontal sinuses. Near complete opacification of the right greater than left ethmoid sinuses. Partial opacification of the bilateral sphenoid sinuses. Near complete opacification of the right maxillary sinus. Partial opacification of the left  maxillary sinus. Partial opacification of the right mastoid air cells. Other: Mild soft tissue swelling overlying the right parietal scalp (series 3/image 25). CT CERVICAL SPINE FINDINGS Alignment: Normal cervical lordosis. Skull base and vertebrae: No acute fracture. No primary bone lesion or focal pathologic process. Soft tissues and spinal canal: No prevertebral fluid or swelling. No visible canal hematoma. Multiple small bilateral cervical lymph nodes measuring up to 10 mm short axis, likely related to the patient's history of CLL. Disc levels:  Moderate multilevel degenerative changes. Spinal canal is patent. Upper chest: Visualized lung apices are clear. Other: Visualized thyroid is grossly unremarkable. IMPRESSION: Mild soft tissue swelling overlying the right parietal scalp. No evidence of acute intracranial abnormality. Small vessel ischemic changes. Extensive paranasal sinus disease, as above. No evidence of traumatic injury to the cervical spine. Moderate multilevel degenerative changes. Multiple small bilateral cervical lymph nodes measuring up to 10 mm short axis, likely related to the patient's history of CLL. Electronically Signed   By: Julian Hy M.D.   On: 07/19/2017 13:31   Dg Femur Min 2 Views Right  Result Date: 07/19/2017 CLINICAL DATA:  Status post fall, pain EXAM: RIGHT FEMUR 2 VIEWS COMPARISON:  None. FINDINGS: There is no evidence of fracture or other focal bone lesions. There is a right total hip arthroplasty without failure or complication. There is mild tricompartmental joint space narrowing of right knee. There is no right knee joint effusion. There is peripheral vascular atherosclerotic disease. Soft tissues are unremarkable. IMPRESSION: No acute osseous injury of  the right femur. Electronically Signed   By: Kathreen Devoid   On: 07/19/2017 11:24   Ct Temporal Bones W Contrast  Result Date: 07/19/2017 CLINICAL DATA:  Fall. Abrasion to right side of head. Drainage from right  ear. EXAM: CT TEMPORAL BONES WITH CONTRAST TECHNIQUE: Axial and coronal plane CT imaging of the petrous temporal bones was performed with thin-collimation image reconstruction after intravenous contrast administration. Multiplanar CT image reconstructions were also generated. CONTRAST:  2m ISOVUE-300 IOPAMIDOL (ISOVUE-300) INJECTION 61% COMPARISON:  CT head without contrast from the same day. Limited sinus CT 04/08/2015. FINDINGS: Limited postcontrast imaging the brain is within normal limits. Globes and orbits are within normal limits. Chronic bilateral maxillary sinus disease is present, right greater than left. There is diffuse ethmoid and inferior frontal sinus disease. A right frontal sinus effusion is noted. There are fluid levels in the sphenoid sinuses bilaterally. The right external auditory canal is filled with soft tissue and fluid. There is fluid or soft tissue within the right middle ear canal, surrounding the middle ear ossicles. Diffuse mastoid effusion is present. There is no associated fracture. The oval window is open. Inner ear structures are normally formed. Lateral and superior semicircular canals are covered. Facial nerve canal is covered. The internal auditory canal and vestibular aqueduct are normal. There is no coalescence of mastoid air cells or evidence for subperiosteal abscess. The left external auditory canal is clear. Tympanic membrane is visualized and normal. The left middle ear ossicles are normally formed and articulating. The oval window is patent. The mastoid air cells are clear. The inner ear structures are normally formed. Lateral and superior semicircular canals are covered. The facial nerve canal is covered. The internal auditory canal and vestibular aqueduct are normal. IMPRESSION: 1. Extensive soft tissue swelling in fluid in the right external auditory canal compatible with otitis externa. No bone destruction is present. 2. Right middle ear effusion and mastoid effusion  is present as well. 3. No coalescence of mastoid air cells or subperiosteal abscess. 4. Left temporal bone is within normal limits. 5. Extensive acute on chronic sinus disease. Electronically Signed   By: CSan MorelleM.D.   On: 07/19/2017 13:43      Blood pressure (!) 122/50, pulse (!) 59, temperature 98.6 F (37 C), temperature source Oral, resp. rate (!) 21, height 6' (1.829 m), weight 94.3 kg (208 lb), SpO2 95 %.  PHYSICAL EXAM: Overall appearance:  Stocky, weak.   Head: NCAT Ears:  Clear AS.  Frank mucopus in RIGHT canal with secondary eczematoid external otitis RIGHT.  I could not see the RIGHT TM.  Some skin changes over the lobule.  No mastoid swelling or edema.  No post auricular or jugular lymph nodes.  No swelling of the external ear.   Nose:  clear Oral Cavity:  clear Oral Pharynx/Hypopharynx/Larynx: not examined  Neuro: cranial nerves intact.  LEFT beating nystagmus, more prominent with LEFTWARD gaze direction.   Neck:  No nodes  Studies Reviewed:  CT temporal bones    Assessment/Plan Acute RIGHT otitis media including fluid in the RIGHT mastoid system, and spontaneous rupture with secondary infection of the RIGHT ear canal skin.  I do not think this is Malignant external otitis.  His diabetes is type II and usually fairly well controlled.  Acute pansinusitis.  Plan:  IV coverage for Pseudomonas and MRSA should cover most other potentially significant organisms in sinuses and ears.  Topical cortisporin.    I would like nasal hygiene measures, Afrin spray  briefly, Flonase x 1 mo plus, and evaluation, cleaning, and audiometry in my office in the immediate future upon discharge.  Jodi Marble 3/53/2992, 6:31 PM

## 2017-07-19 NOTE — ED Notes (Signed)
Meal tray ordered 

## 2017-07-20 ENCOUNTER — Inpatient Hospital Stay (HOSPITAL_COMMUNITY): Payer: Medicare Other

## 2017-07-20 ENCOUNTER — Telehealth: Payer: Self-pay | Admitting: Nurse Practitioner

## 2017-07-20 ENCOUNTER — Other Ambulatory Visit: Payer: Self-pay

## 2017-07-20 DIAGNOSIS — I429 Cardiomyopathy, unspecified: Secondary | ICD-10-CM

## 2017-07-20 DIAGNOSIS — L8990 Pressure ulcer of unspecified site, unspecified stage: Secondary | ICD-10-CM

## 2017-07-20 HISTORY — PX: TRANSTHORACIC ECHOCARDIOGRAM: SHX275

## 2017-07-20 LAB — GLUCOSE, CAPILLARY
Glucose-Capillary: 272 mg/dL — ABNORMAL HIGH (ref 65–99)
Glucose-Capillary: 276 mg/dL — ABNORMAL HIGH (ref 65–99)
Glucose-Capillary: 257 mg/dL — ABNORMAL HIGH (ref 65–99)
Glucose-Capillary: 283 mg/dL — ABNORMAL HIGH (ref 65–99)

## 2017-07-20 LAB — CBC
HCT: 29.7 % — ABNORMAL LOW (ref 39.0–52.0)
Hemoglobin: 10.2 g/dL — ABNORMAL LOW (ref 13.0–17.0)
MCH: 32.8 pg (ref 26.0–34.0)
MCHC: 34.3 g/dL (ref 30.0–36.0)
MCV: 95.5 fL (ref 78.0–100.0)
Platelets: 437 10*3/uL — ABNORMAL HIGH (ref 150–400)
RBC: 3.11 MIL/uL — ABNORMAL LOW (ref 4.22–5.81)
RDW: 18.3 % — ABNORMAL HIGH (ref 11.5–15.5)
WBC: 68.9 10*3/uL (ref 4.0–10.5)

## 2017-07-20 LAB — BASIC METABOLIC PANEL
Anion gap: 11 (ref 5–15)
BUN: 19 mg/dL (ref 6–20)
CO2: 19 mmol/L — ABNORMAL LOW (ref 22–32)
Calcium: 7.8 mg/dL — ABNORMAL LOW (ref 8.9–10.3)
Chloride: 98 mmol/L — ABNORMAL LOW (ref 101–111)
Creatinine, Ser: 1.03 mg/dL (ref 0.61–1.24)
GFR calc Af Amer: >60 mL/min (ref >60)
GFR calc non Af Amer: >60 mL/min (ref >60)
Glucose, Bld: 247 mg/dL — ABNORMAL HIGH (ref 65–99)
Potassium: 4.1 mmol/L (ref 3.5–5.1)
Sodium: 128 mmol/L — ABNORMAL LOW (ref 135–145)

## 2017-07-20 LAB — PROTIME-INR
INR: 5.42
Prothrombin Time: 49.1 s — ABNORMAL HIGH (ref 11.4–15.2)

## 2017-07-20 LAB — ECHOCARDIOGRAM COMPLETE
Height: 70 in
Weight: 3264 oz

## 2017-07-20 LAB — MAGNESIUM: Magnesium: 1.6 mg/dL — ABNORMAL LOW (ref 1.7–2.4)

## 2017-07-20 MED ORDER — MAGNESIUM SULFATE 2 GM/50ML IV SOLN
2.0000 g | Freq: Once | INTRAVENOUS | Status: AC
  Administered 2017-07-20: 2 g via INTRAVENOUS
  Filled 2017-07-20: qty 50

## 2017-07-20 MED ORDER — FUROSEMIDE 10 MG/ML IJ SOLN
40.0000 mg | Freq: Once | INTRAMUSCULAR | Status: AC
  Administered 2017-07-20: 40 mg via INTRAVENOUS
  Filled 2017-07-20: qty 4

## 2017-07-20 MED ORDER — GUAIFENESIN-DM 100-10 MG/5ML PO SYRP
5.0000 mL | ORAL_SOLUTION | ORAL | Status: DC | PRN
  Administered 2017-07-20 – 2017-07-21 (×7): 5 mL via ORAL
  Filled 2017-07-20 (×7): qty 5

## 2017-07-20 NOTE — Progress Notes (Signed)
CRITICAL VALUE ALERT  Critical Value:  INR = 5.42  Date & Time Notied:  07/20/17 1610  Provider Notified: Silas Sacramento  Orders Received/Actions taken: awaiting

## 2017-07-20 NOTE — Telephone Encounter (Signed)
    [facility] Pre-operative Risk Assessment    Request for surgical clearance:  1. What type of surgery is being performed? Cystoscopy, Transurethral Resection of Bladder Tumor  2. When is this surgery scheduled? Pending   3. What type of clearance is required (medical clearance vs. Pharmacy clearance to hold med vs. Both)?  Both  4. Are there any medications that need to be held prior to surgery and how long? Request to stop Coumadin 5 days before    5. Practice name and name of physician performing surgery? Dr. Diona Fanti, [facility]   6. What is your office phone and fax number?  Phone: [phone], Fax: [phone]  7. Anesthesia type (None, local, MAC, general) ? Not Indicated   Oscar Baker 07/20/2017, 4:42 PM  _________________________________________________________________   (provider comments below)

## 2017-07-20 NOTE — Evaluation (Signed)
Physical Therapy Evaluation Patient Details Name: Oscar Baker MRN: [id number] DOB: 28-Dec-1932 Today's Date: 07/20/2017   History of Present Illness  Pt is an 84 y.o. 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 Xray shows no acute osseous injury to R femur or pelvis. CT head/neck shows no acute intracranial abnormality; small bilateral cervical lymph nodes likely related to pt's h/o chronic lymphocytic leukemia. PMH includes DM, HTN, pacemaker, sick sinus syndrome, prostate cancer, bilateral THA (left in 2017).     Clinical Impression  Pt presents with significant R hip pain and an overall decrease in functional mobility secondary to above. PTA, pt indep with all mobility; since recent fall at home, has been limited by hip pain. Today, pt able to amb short distance with RW and minA for balance; significant difficulty WB through RLE. Pt would benefit from continued acute PT services to maximize functional mobility and independence prior to d/c with SNF-level therapies.     Follow Up Recommendations SNF;Supervision for mobility/OOB    Equipment Recommendations  Rolling walker with 5" wheels    Recommendations for Other Services       Precautions / Restrictions Precautions Precautions: Fall Precaution Comments: R hip pain Restrictions Weight Bearing Restrictions: No      Mobility  Bed Mobility Overal bed mobility: Needs Assistance Bed Mobility: Supine to Sit     Supine to sit: Min guard;Min assist     General bed mobility comments: Increased time and effort for bed mobility secondary to R hip pain; 1x minA to maintain seated balance secondary to core fatigue and hip pain  Transfers Overall transfer level: Needs assistance Equipment used: Rolling walker (2 wheeled) Transfers: Sit to/from Stand Sit to Stand: Min assist;From elevated surface          General transfer comment: Able to stand with RW and minA to assist trunk elevation from elevated bed height. Increased time and effort secondary to R hip pain. Cues for hand placement  Ambulation/Gait Ambulation/Gait assistance: Min assist Ambulation Distance (Feet): 10 Feet Assistive device: Rolling walker (2 wheeled) Gait Pattern/deviations: Step-to pattern;Decreased weight shift to right;Antalgic;Trunk flexed Gait velocity: Decreased Gait velocity interpretation: <1.8 ft/sec, indicative of risk for recurrent falls General Gait Details: Slow, antalgic amb with RW; 1x LOB requiring minA and UE support to correct. Pt with decreased ability to completely WB through RLE due to pain  Stairs            Wheelchair Mobility    Modified Rankin (Stroke Patients Only)       Balance Overall balance assessment: Needs assistance   Sitting balance-Leahy Scale: Fair       Standing balance-Leahy Scale: Poor                               Pertinent Vitals/Pain Pain Assessment: Faces Faces Pain Scale: Hurts even more Pain Location: R hip Pain Descriptors / Indicators: Throbbing;Constant Pain Intervention(s): Monitored during session;Limited activity within patient's tolerance;Repositioned    Home Living Family/patient expects to be discharged to:: Private residence Living Arrangements: Spouse/significant other Available Help at Discharge: Family;Available PRN/intermittently Type of Home: Apartment Home Access: Level entry     Home Layout: One level Home Equipment: None      Prior Function Level of Independence: Independent  Hand Dominance        Extremity/Trunk Assessment   Upper Extremity Assessment Upper Extremity Assessment: Overall WFL for tasks assessed    Lower Extremity Assessment Lower Extremity Assessment: RLE deficits/detail;Generalized weakness RLE Deficits / Details: R hip flex 3/5, knee flex/ext grossly  4/5 RLE: Unable to fully assess due to pain       Communication   Communication: HOH  Cognition Arousal/Alertness: Awake/alert Behavior During Therapy: WFL for tasks assessed/performed Overall Cognitive Status: Within Functional Limits for tasks assessed                                        General Comments General comments (skin integrity, edema, etc.): Wife present throughout session    Exercises     Assessment/Plan    PT Assessment Patient needs continued PT services  PT Problem List Decreased strength;Decreased range of motion;Decreased activity tolerance;Decreased balance;Decreased mobility;Decreased knowledge of use of DME;Pain       PT Treatment Interventions DME instruction;Gait training;Functional mobility training;Therapeutic activities;Therapeutic exercise;Balance training;Patient/family education    PT Goals (Current goals can be found in the Care Plan section)  Acute Rehab PT Goals Patient Stated Goal: Decreased pain PT Goal Formulation: With patient Time For Goal Achievement: 08/03/17 Potential to Achieve Goals: Good    Frequency Min 2X/week   Barriers to discharge        Co-evaluation               AM-PAC PT "6 Clicks" Daily Activity  Outcome Measure Difficulty turning over in bed (including adjusting bedclothes, sheets and blankets)?: A Little Difficulty moving from lying on back to sitting on the side of the bed? : A Little Difficulty sitting down on and standing up from a chair with arms (e.g., wheelchair, bedside commode, etc,.)?: Unable Help needed moving to and from a bed to chair (including a wheelchair)?: A Little Help needed walking in hospital room?: A Little Help needed climbing 3-5 steps with a railing? : A Lot 6 Click Score: 15    End of Session Equipment Utilized During Treatment: Gait belt Activity Tolerance: Patient limited by pain Patient left: in chair;with call bell/phone within reach;with family/visitor  present Nurse Communication: Mobility status PT Visit Diagnosis: Other abnormalities of gait and mobility (R26.89);Pain Pain - Right/Left: Right Pain - part of body: Hip    Time: 5465-0354 PT Time Calculation (min) (ACUTE ONLY): 29 min   Charges:   PT Evaluation $PT Eval Moderate Complexity: 1 Mod PT Treatments $Therapeutic Activity: 8-22 mins   PT G Codes:       Mabeline Caras, PT, DPT Acute Rehab Services  Pager: [place] 07/20/2017, 11:51 AM

## 2017-07-20 NOTE — Progress Notes (Signed)
Inpatient Diabetes Program Recommendations  AACE/ADA: New Consensus Statement on Inpatient Glycemic Control (2015)  Target Ranges:  Prepandial:   less than 140 mg/dL      Peak postprandial:   less than 180 mg/dL (1-2 hours)      Critically ill patients:  140 - 180 mg/dL   Lab Results  Component Value Date   GLUCAP 276 (H) 07/20/2017   HGBA1C 6.0 (H) 05/05/2016    Review of Glycemic Control Results for Oscar Baker, Oscar Baker (MRN [id number]) as of 07/20/2017 11:41  Ref. Range 07/19/2017 09:50 07/19/2017 19:02 07/19/2017 23:08 07/20/2017 07:30 07/20/2017 11:20  Glucose-Capillary Latest Ref Range: 65 - 99 mg/dL 263 (H) 252 (H) 234 (H) 272 (H) 276 (H)  Results for Oscar Baker, Oscar Baker (MRN [id number]) as of 07/21/2017 09:34  Ref. Range 07/20/2017 07:30 07/20/2017 11:20 07/20/2017 17:05 07/20/2017 20:52 07/21/2017 07:39  Glucose-Capillary Latest Ref Range: 65 - 99 mg/dL 272 (H) 276 (H) 257 (H) 283 (H) 246 (H)   Diabetes history: DM2 Outpatient Diabetes medications: Glucotrol 2.5 mg + Januvia 100 q hs + Metformin 1 gm bid Current orders for Inpatient glycemic control: Novolog correction sensitive tid  Inpatient Diabetes Program Recommendations:   -A1c -While in the hospital and oral medications held: Lantus 10 units daily  Thank you, Oscar Baker. Oscar Baker , RN, MSN, CDE  Diabetes Coordinator Inpatient Glycemic Control Team Team Pager [phone] (8am-5pm) 07/20/2017 11:43 AM

## 2017-07-20 NOTE — Progress Notes (Signed)
  Echocardiogram 2D Echocardiogram has been performed.  Jennette Dubin 07/20/2017, 9:36 AM

## 2017-07-20 NOTE — Progress Notes (Signed)
PROGRESS NOTE    Oscar Baker  URK:[id number] DOB: 08-Sep-1932 DOA: 07/19/2017 PCP: [name], MD  Brief Narrative: 84 y.o. male with medical history significant of atrial fibrillation on warfarin, CLL not on any treatment, sick sinus syndrome status post pacer, prostate cancer status post prostatectomy, type 2 diabetes on oral hypoglycemics, bilateral hip replacements who comes in after mechanical fall at home, ear drainage for several days, and sinus congestion and shortness of breath.      Assessment & Plan:   Active Problems:   Malignant otitis externa   Fall at home, initial encounter   Acute on chronic diastolic CHF (congestive heart failure) (HCC)   Acute on chronic diastolic heart failure (HCC)   Pressure injury of skin  ##) Fall with elevated INR: Mechanical fall, admission head CT was negative -PT evaluation recommends sniff - Hold warfarin, no need for reversal at this time -Case management for possible rehab -Repeat noncontrast head CT today  ##) Mild likely diastolic heart failure exacerbation: -Echo on 07/20/2017 shows EF of 45-50%, basal and mid inferior hypokinesis, mild LVH -Continue furosemide IV 40 mg twice daily -2 g sodium restricted diet, strict ins and outs, 2 L fluid restriction, weight daily  ##) Otitis externa: Patient has fever to 100.4 this morning -CT temporal bones on admission shows evidence of fluid in the mastoid cells, however no evidence of mastoiditis -ENT consult appreciate recommendations -Continue Zosyn and doxycycline IV at this time to cover for MRSA, and Pseudomonas  ##) Atrial fibrillation complicated by sick sinus syndrome status post pacer on warfarin: -Hold warfarin -Telemetry  ##) CLL: Likely row stage 0 as patient is not merit any treatment.  He has minimal lymphadenopathy and no evidence of other organ involvement.  His platelets are if anything high.    ##) Type 2 diabetes: -Sliding scale insulin, before meals at  bedtime -Hold home oral meds  ##) Benign prostatic hypertrophy: -Continue home meds  ##) Prostate cancer now with rising PSA: Low suspicion for metastatic disease to hip - Nuclear medicine bone scan on 04/13/2017 shows no evidence of metastatic disease in the hip  FEN: -Fluids: restrict - electrolytes: monitor and supp - nutrition: 2gNa restricted diet  Prophy: elevated INR  DIspo: pending PT recs  DNR    Consultants:   Ear nose and throat  Procedures:   Echo 07/20/2017:  - Left ventricle: The cavity size was mildly dilated. Wall   thickness was increased in a pattern of mild LVH. Systolic   function was mildly reduced. The estimated ejection fraction was   in the range of 45% to 50%. Hypokinesis of the basal-midinferior   myocardium. - Ventricular septum: Septal motion showed abnormal function and   dyssynchrony conistent with bundle branch block or ventricular   pacing. - Aortic valve: Transvalvular velocity was within the normal range.   There was no stenosis. There was no regurgitation. Valve area   (VTI): 1.95 cm^2. Valve area (Vmax): 2.21 cm^2. Valve area   (Vmean): 1.93 cm^2. - Mitral valve: Mildly calcified annulus. Transvalvular velocity   was within the normal range. There was no evidence for stenosis.   There was no regurgitation. - Left atrium: The atrium was severely dilated. - Right ventricle: The cavity size was normal. Wall thickness was   normal. Systolic function was normal. - Right atrium: The atrium was severely dilated. - Atrial septum: No defect or patent foramen ovale was identified. - Tricuspid valve: There was trivial regurgitation.  Antimicrobials:  Zosyn and doxycycline started 07/19/2017   Subjective: Patient reports doing well.  He is work with PT who felt that he might benefit from skilled nursing facility.  He otherwise reports continued cough that is productive of mildly clear sputum.  And continued ear  drainage.  Objective: Vitals:   07/20/17 0409 07/20/17 0700 07/20/17 0730 07/20/17 0738  BP: (!) 105/56  (!) 116/53   Pulse: 82  70   Resp: (!) 22  20 (!) 36  Temp: 98.2 F (36.8 C)  98.4 F (36.9 C)   TempSrc: Oral  Oral   SpO2: 92% 96% 92%   Weight: 92.5 kg (204 lb)     Height:        Intake/Output Summary (Last 24 hours) at 07/20/2017 1212 Last data filed at 07/20/2017 0848 Gross per 24 hour  Intake 1560 ml  Output 300 ml  Net 1260 ml   Filed Weights   07/19/17 0953 07/19/17 2302 07/20/17 0409  Weight: 94.3 kg (208 lb) 93.4 kg (205 lb 14.4 oz) 92.5 kg (204 lb)    Examination:  General exam: Appears calm and comfortable  Respiratory system: Clear to auscultation. Respiratory effort normal. Cardiovascular system: S1 & S2 heard, RRR. No JVD, murmurs, rubs, gallops or clicks. No pedal edema. Gastrointestinal system: Abdomen is nondistended, soft and nontender. No organomegaly or masses felt. Normal bowel sounds heard. Central nervous system: Alert and oriented. No focal neurological deficits. Extremities: Symmetric 5 x 5 power. Skin: No rashes, lesions or ulcers Psychiatry: Judgement and insight appear normal. Mood & affect appropriate.     Data Reviewed: I have personally reviewed following labs and imaging studies  CBC: Recent Labs  Lab 07/19/17 1030 07/20/17 0517  WBC 56.1* 68.9*  NEUTROABS 22.4*  --   HGB 10.4* 10.2*  HCT 31.2* 29.7*  MCV 96.3 95.5  PLT 445* 099*   Basic Metabolic Panel: Recent Labs  Lab 07/19/17 1030 07/20/17 0517  NA 130* 128*  K 4.4 4.1  CL 100* 98*  CO2 18* 19*  GLUCOSE 294* 247*  BUN 13 19  CREATININE 0.89 1.03  CALCIUM 8.2* 7.8*  MG  --  1.6*   GFR: Estimated Creatinine Clearance: 63.2 mL/min (by C-G formula based on SCr of 1.03 mg/dL). Liver Function Tests: Recent Labs  Lab 07/19/17 1030  AST 26  ALT 19  ALKPHOS 71  BILITOT 1.0  PROT 5.5*  ALBUMIN 2.4*   No results for input(s): LIPASE, AMYLASE in the last  168 hours. No results for input(s): AMMONIA in the last 168 hours. Coagulation Profile: Recent Labs  Lab 07/19/17 1030 07/20/17 0517  INR 4.39* 5.42*   Cardiac Enzymes: No results for input(s): CKTOTAL, CKMB, CKMBINDEX, TROPONINI in the last 168 hours. BNP (last 3 results) No results for input(s): PROBNP in the last 8760 hours. HbA1C: No results for input(s): HGBA1C in the last 72 hours. CBG: Recent Labs  Lab 07/19/17 0950 07/19/17 1902 07/19/17 2308 07/20/17 0730 07/20/17 1120  GLUCAP 263* 252* 234* 272* 276*   Lipid Profile: No results for input(s): CHOL, HDL, LDLCALC, TRIG, CHOLHDL, LDLDIRECT in the last 72 hours. Thyroid Function Tests: No results for input(s): TSH, T4TOTAL, FREET4, T3FREE, THYROIDAB in the last 72 hours. Anemia Panel: No results for input(s): VITAMINB12, FOLATE, FERRITIN, TIBC, IRON, RETICCTPCT in the last 72 hours. Sepsis Labs: Recent Labs  Lab 07/19/17 1052 07/19/17 1913  LATICACIDVEN 1.22 2.77*    Recent Results (from the past 240 hour(s))  Wound or Superficial Culture  Status: None (Preliminary result)   Collection Time: 07/19/17  4:53 PM  Result Value Ref Range Status   Specimen Description EAR RIGHT  Final   Special Requests Normal  Final   Gram Stain   Final    FEW WBC PRESENT, PREDOMINANTLY PMN ABUNDANT GRAM POSITIVE COCCI MODERATE GRAM POSITIVE RODS    Culture TOO YOUNG TO READ  Final   Report Status PENDING  Incomplete         Radiology Studies: Dg Chest 2 View  Result Date: 07/19/2017 CLINICAL DATA:  Patient fell this morning. History of 1 week of sickness with weakness and instability. History of atrial fibrillation on anticoagulants. Abnormal lung sounds, some productive cough. EXAM: CHEST  2 VIEW COMPARISON:  PA and lateral chest x-ray of January 08, 2015 FINDINGS: The lungs are adequately inflated. The interstitial markings are increased. The cardiac silhouette is enlarged and the pulmonary vascularity is engorged.  There is no alveolar pneumonia. There is no large pleural effusion. The ICD is in stable position. There is multilevel degenerative disc disease of the thoracic spine. There is mild degenerative change of the right shoulder. IMPRESSION: CHF with mild interstitial edema.  No alveolar pneumonia. Electronically Signed   By: David  Martinique M.D.   On: 07/19/2017 11:25   Dg Pelvis 1-2 Views  Result Date: 07/19/2017 CLINICAL DATA:  Fall, right hip abrasion EXAM: PELVIS - 1-2 VIEW COMPARISON:  None. FINDINGS: Right hip arthroplasty.  No evidence of complication. Left hip arthroplasty.  No evidence of complication. Surgical clips overlying the pelvis. Visualized bony pelvis appears intact. Degenerative changes of the lower lumbar spine. IMPRESSION: Bilateral hip arthroplasties, without evidence of complication. No fracture or dislocation is seen. Electronically Signed   By: Julian Hy M.D.   On: 07/19/2017 11:33   Ct Head Wo Contrast  Result Date: 07/19/2017 CLINICAL DATA:  Fall, head/C-spine trauma EXAM: CT HEAD WITHOUT CONTRAST CT CERVICAL SPINE WITHOUT CONTRAST TECHNIQUE: Multidetector CT imaging of the head and cervical spine was performed following the standard protocol without intravenous contrast. Multiplanar CT image reconstructions of the cervical spine were also generated. COMPARISON:  None. FINDINGS: CT HEAD FINDINGS Brain: No evidence of acute infarction, hemorrhage, hydrocephalus, extra-axial collection or mass lesion/mass effect. Subcortical white matter and periventricular small vessel ischemic changes. Vascular: Intracranial atherosclerosis. Skull: Normal. Negative for fracture or focal lesion. Sinuses/Orbits: Partial opacification of the right greater than left frontal sinuses. Near complete opacification of the right greater than left ethmoid sinuses. Partial opacification of the bilateral sphenoid sinuses. Near complete opacification of the right maxillary sinus. Partial opacification of the  left maxillary sinus. Partial opacification of the right mastoid air cells. Other: Mild soft tissue swelling overlying the right parietal scalp (series 3/image 25). CT CERVICAL SPINE FINDINGS Alignment: Normal cervical lordosis. Skull base and vertebrae: No acute fracture. No primary bone lesion or focal pathologic process. Soft tissues and spinal canal: No prevertebral fluid or swelling. No visible canal hematoma. Multiple small bilateral cervical lymph nodes measuring up to 10 mm short axis, likely related to the patient's history of CLL. Disc levels:  Moderate multilevel degenerative changes. Spinal canal is patent. Upper chest: Visualized lung apices are clear. Other: Visualized thyroid is grossly unremarkable. IMPRESSION: Mild soft tissue swelling overlying the right parietal scalp. No evidence of acute intracranial abnormality. Small vessel ischemic changes. Extensive paranasal sinus disease, as above. No evidence of traumatic injury to the cervical spine. Moderate multilevel degenerative changes. Multiple small bilateral cervical lymph nodes measuring up to 10  mm short axis, likely related to the patient's history of CLL. Electronically Signed   By: Julian Hy M.D.   On: 07/19/2017 13:31   Ct Cervical Spine Wo Contrast  Result Date: 07/19/2017 CLINICAL DATA:  Fall, head/C-spine trauma EXAM: CT HEAD WITHOUT CONTRAST CT CERVICAL SPINE WITHOUT CONTRAST TECHNIQUE: Multidetector CT imaging of the head and cervical spine was performed following the standard protocol without intravenous contrast. Multiplanar CT image reconstructions of the cervical spine were also generated. COMPARISON:  None. FINDINGS: CT HEAD FINDINGS Brain: No evidence of acute infarction, hemorrhage, hydrocephalus, extra-axial collection or mass lesion/mass effect. Subcortical white matter and periventricular small vessel ischemic changes. Vascular: Intracranial atherosclerosis. Skull: Normal. Negative for fracture or focal lesion.  Sinuses/Orbits: Partial opacification of the right greater than left frontal sinuses. Near complete opacification of the right greater than left ethmoid sinuses. Partial opacification of the bilateral sphenoid sinuses. Near complete opacification of the right maxillary sinus. Partial opacification of the left maxillary sinus. Partial opacification of the right mastoid air cells. Other: Mild soft tissue swelling overlying the right parietal scalp (series 3/image 25). CT CERVICAL SPINE FINDINGS Alignment: Normal cervical lordosis. Skull base and vertebrae: No acute fracture. No primary bone lesion or focal pathologic process. Soft tissues and spinal canal: No prevertebral fluid or swelling. No visible canal hematoma. Multiple small bilateral cervical lymph nodes measuring up to 10 mm short axis, likely related to the patient's history of CLL. Disc levels:  Moderate multilevel degenerative changes. Spinal canal is patent. Upper chest: Visualized lung apices are clear. Other: Visualized thyroid is grossly unremarkable. IMPRESSION: Mild soft tissue swelling overlying the right parietal scalp. No evidence of acute intracranial abnormality. Small vessel ischemic changes. Extensive paranasal sinus disease, as above. No evidence of traumatic injury to the cervical spine. Moderate multilevel degenerative changes. Multiple small bilateral cervical lymph nodes measuring up to 10 mm short axis, likely related to the patient's history of CLL. Electronically Signed   By: Julian Hy M.D.   On: 07/19/2017 13:31   Dg Femur Min 2 Views Right  Result Date: 07/19/2017 CLINICAL DATA:  Status post fall, pain EXAM: RIGHT FEMUR 2 VIEWS COMPARISON:  None. FINDINGS: There is no evidence of fracture or other focal bone lesions. There is a right total hip arthroplasty without failure or complication. There is mild tricompartmental joint space narrowing of right knee. There is no right knee joint effusion. There is peripheral vascular  atherosclerotic disease. Soft tissues are unremarkable. IMPRESSION: No acute osseous injury of the right femur. Electronically Signed   By: Kathreen Devoid   On: 07/19/2017 11:24   Ct Temporal Bones W Contrast  Result Date: 07/19/2017 CLINICAL DATA:  Fall. Abrasion to right side of head. Drainage from right ear. EXAM: CT TEMPORAL BONES WITH CONTRAST TECHNIQUE: Axial and coronal plane CT imaging of the petrous temporal bones was performed with thin-collimation image reconstruction after intravenous contrast administration. Multiplanar CT image reconstructions were also generated. CONTRAST:  27mL ISOVUE-300 IOPAMIDOL (ISOVUE-300) INJECTION 61% COMPARISON:  CT head without contrast from the same day. Limited sinus CT 04/08/2015. FINDINGS: Limited postcontrast imaging the brain is within normal limits. Globes and orbits are within normal limits. Chronic bilateral maxillary sinus disease is present, right greater than left. There is diffuse ethmoid and inferior frontal sinus disease. A right frontal sinus effusion is noted. There are fluid levels in the sphenoid sinuses bilaterally. The right external auditory canal is filled with soft tissue and fluid. There is fluid or soft tissue within the  right middle ear canal, surrounding the middle ear ossicles. Diffuse mastoid effusion is present. There is no associated fracture. The oval window is open. Inner ear structures are normally formed. Lateral and superior semicircular canals are covered. Facial nerve canal is covered. The internal auditory canal and vestibular aqueduct are normal. There is no coalescence of mastoid air cells or evidence for subperiosteal abscess. The left external auditory canal is clear. Tympanic membrane is visualized and normal. The left middle ear ossicles are normally formed and articulating. The oval window is patent. The mastoid air cells are clear. The inner ear structures are normally formed. Lateral and superior semicircular canals are  covered. The facial nerve canal is covered. The internal auditory canal and vestibular aqueduct are normal. IMPRESSION: 1. Extensive soft tissue swelling in fluid in the right external auditory canal compatible with otitis externa. No bone destruction is present. 2. Right middle ear effusion and mastoid effusion is present as well. 3. No coalescence of mastoid air cells or subperiosteal abscess. 4. Left temporal bone is within normal limits. 5. Extensive acute on chronic sinus disease. Electronically Signed   By: San Morelle M.D.   On: 07/19/2017 13:43        Scheduled Meds: . amLODipine  10 mg Oral QHS  . aspirin EC  81 mg Oral QHS  . atorvastatin  20 mg Oral q1800  . docusate sodium  100 mg Oral BID  . fluticasone  2 spray Each Nare Daily  . furosemide  40 mg Intravenous BID  . insulin aspart  0-9 Units Subcutaneous TID WC  . multivitamin with minerals  1 tablet Oral Daily  . neomycin-polymyxin-hydrocortisone  3 drop Right EAR Q6H  . oxymetazoline  2 spray Each Nare BID  . sodium chloride flush  3 mL Intravenous Q12H  . vitamin B-12  1,000 mcg Oral Daily   Continuous Infusions: . sodium chloride    . doxycycline (VIBRAMYCIN) IV Stopped (07/20/17 0109)  . piperacillin-tazobactam (ZOSYN)  IV 3.375 g (07/20/17 1002)     LOS: 1 day    Time spent: 74    Cristy Folks, MD [facility]  If 7PM-7AM, please contact night-coverage [email or web address] Password [email or web address] 07/20/2017, 12:12 PM

## 2017-07-20 NOTE — Progress Notes (Signed)
Pt was coughing and complains of little short of breath, fine crackles on left lung base. MD was notified and ordered 40 mg IV lasix. Robitussin given. Will continue to monitor pt.

## 2017-07-21 ENCOUNTER — Inpatient Hospital Stay (HOSPITAL_COMMUNITY): Payer: Medicare Other

## 2017-07-21 ENCOUNTER — Other Ambulatory Visit: Payer: Self-pay

## 2017-07-21 DIAGNOSIS — I5021 Acute systolic (congestive) heart failure: Secondary | ICD-10-CM

## 2017-07-21 DIAGNOSIS — M25551 Pain in right hip: Secondary | ICD-10-CM

## 2017-07-21 DIAGNOSIS — H6690 Otitis media, unspecified, unspecified ear: Secondary | ICD-10-CM

## 2017-07-21 DIAGNOSIS — Y92009 Unspecified place in unspecified non-institutional (private) residence as the place of occurrence of the external cause: Secondary | ICD-10-CM

## 2017-07-21 DIAGNOSIS — E119 Type 2 diabetes mellitus without complications: Secondary | ICD-10-CM

## 2017-07-21 DIAGNOSIS — I5022 Chronic systolic (congestive) heart failure: Secondary | ICD-10-CM

## 2017-07-21 DIAGNOSIS — I5033 Acute on chronic diastolic (congestive) heart failure: Secondary | ICD-10-CM

## 2017-07-21 DIAGNOSIS — W19XXXA Unspecified fall, initial encounter: Secondary | ICD-10-CM

## 2017-07-21 LAB — CBC
HCT: 28.2 % — ABNORMAL LOW (ref 39.0–52.0)
Hemoglobin: 9.7 g/dL — ABNORMAL LOW (ref 13.0–17.0)
MCH: 32.7 pg (ref 26.0–34.0)
MCHC: 34.4 g/dL (ref 30.0–36.0)
MCV: 94.9 fL (ref 78.0–100.0)
Platelets: 456 10*3/uL — ABNORMAL HIGH (ref 150–400)
RBC: 2.97 MIL/uL — ABNORMAL LOW (ref 4.22–5.81)
RDW: 18.5 % — ABNORMAL HIGH (ref 11.5–15.5)
WBC: 70.1 K/uL (ref 4.0–10.5)

## 2017-07-21 LAB — BASIC METABOLIC PANEL
CO2: 21 mmol/L — ABNORMAL LOW (ref 22–32)
Calcium: 7.5 mg/dL — ABNORMAL LOW (ref 8.9–10.3)
Creatinine, Ser: 1.14 mg/dL (ref 0.61–1.24)
GFR calc Af Amer: >60 mL/min (ref >60)
Sodium: 124 mmol/L — ABNORMAL LOW (ref 135–145)

## 2017-07-21 LAB — URINE CULTURE

## 2017-07-21 LAB — BASIC METABOLIC PANEL WITH GFR
Anion gap: 11 (ref 5–15)
BUN: 23 mg/dL — ABNORMAL HIGH (ref 6–20)
Chloride: 92 mmol/L — ABNORMAL LOW (ref 101–111)
GFR calc non Af Amer: 58 mL/min — ABNORMAL LOW (ref >60)
Glucose, Bld: 232 mg/dL — ABNORMAL HIGH (ref 65–99)
Potassium: 3.8 mmol/L (ref 3.5–5.1)

## 2017-07-21 LAB — GLUCOSE, CAPILLARY
Glucose-Capillary: 246 mg/dL — ABNORMAL HIGH (ref 65–99)
Glucose-Capillary: 251 mg/dL — ABNORMAL HIGH (ref 65–99)
Glucose-Capillary: 262 mg/dL — ABNORMAL HIGH (ref 65–99)
Glucose-Capillary: 239 mg/dL — ABNORMAL HIGH (ref 65–99)

## 2017-07-21 LAB — OSMOLALITY: OSMOLALITY: 274 mosm/kg — AB (ref 275–295)

## 2017-07-21 LAB — PROTIME-INR
INR: 3.18
Prothrombin Time: 32.4 s — ABNORMAL HIGH (ref 11.4–15.2)

## 2017-07-21 LAB — MAGNESIUM: Magnesium: 1.9 mg/dL (ref 1.7–2.4)

## 2017-07-21 LAB — NA AND K (SODIUM & POTASSIUM), RAND UR
Potassium Urine: 34 mmol/L
SODIUM UR: 29 mmol/L

## 2017-07-21 LAB — PATHOLOGIST SMEAR REVIEW

## 2017-07-21 LAB — OSMOLALITY, URINE: Osmolality, Ur: 489 mOsm/kg (ref 300–900)

## 2017-07-21 MED ORDER — POTASSIUM CHLORIDE CRYS ER 20 MEQ PO TBCR
40.0000 meq | EXTENDED_RELEASE_TABLET | Freq: Once | ORAL | Status: AC
  Administered 2017-07-21: 40 meq via ORAL
  Filled 2017-07-21: qty 2

## 2017-07-21 MED ORDER — WARFARIN - PHARMACIST DOSING INPATIENT
Freq: Every day | Status: DC
  Administered 2017-07-21: 1
  Administered 2017-07-22: 17:00:00

## 2017-07-21 MED ORDER — ZOLPIDEM TARTRATE 5 MG PO TABS
5.0000 mg | ORAL_TABLET | Freq: Once | ORAL | Status: AC
  Administered 2017-07-21: 5 mg via ORAL
  Filled 2017-07-21: qty 1

## 2017-07-21 MED ORDER — WARFARIN SODIUM 2 MG PO TABS
2.0000 mg | ORAL_TABLET | Freq: Once | ORAL | Status: AC
  Administered 2017-07-21: 2 mg via ORAL
  Filled 2017-07-21: qty 1

## 2017-07-21 NOTE — Clinical Social Work Note (Signed)
Clinical Social Work Assessment  Patient Details  Name: Oscar Baker MRN: [id number] Date of Birth: 1933/06/06  Date of referral:  07/21/17               Reason for consult:  Facility Placement, Discharge Planning                Permission sought to share information with:  Facility Sport and exercise psychologist, Family Supports Permission granted to share information::  Yes, Verbal Permission Granted  Name::     Oscar Baker  Agency::  [facility]  Relationship::  Wife  Contact Information:  [phone]  Housing/Transportation Living arrangements for the past 2 months:  [place] of Information:  Patient, Medical Team, Spouse Patient Interpreter Needed:  None Criminal Activity/Legal Involvement Pertinent to Current Situation/Hospitalization:  No - Comment as needed Significant Relationships:  Spouse, Other Family Members Lives with:  Spouse Do you feel safe going back to the place where you live?  Yes Need for family participation in patient care:  Yes (Comment)  Care giving concerns:  PT recommending SNF once medically stable for discharge.   Social Worker assessment / plan:  CSW met with patient. Wife and another family member at bedside. CSW introduced role and explained that PT recommendations would be discussed. Patient and his wife agreeable to SNF. Patient said his first, second, and third preference is [facility]. Patient is a Chief Executive Officer. CSW has sent referral and left message for admissions coordinator to notify. No further concerns. CSW encouraged patient and his wife to contact CSW as needed. CSW will continue to follow patient and his family for support and facilitate discharge to SNF once medically stable.  Employment status:  Retired Forensic scientist:  Medicare PT Recommendations:  [place] / Referral to community resources:  [place]  Patient/Family's Response to care:  Patient and his wife agreeable to SNF  placement at [facility]. Patient's family supportive and involved in patient's care. Patient and his wife appreciated social work intervention.  Patient/Family's Understanding of and Emotional Response to Diagnosis, Current Treatment, and Prognosis:  Patient and his wife have a good understanding of the reason for admission and his need for rehab prior to returning home. Patient and his wife appear happy with hospital care.  Emotional Assessment Appearance:  Appears stated age Attitude/Demeanor/Rapport:  Engaged Affect (typically observed):  Accepting, Appropriate, Calm Orientation:  Oriented to Self, Oriented to Place, Oriented to  Time, Oriented to Situation Alcohol / Substance use:  Never Used Psych involvement (Current and /or in the community):  No (Comment)  Discharge Needs  Concerns to be addressed:  Care Coordination Readmission within the last 30 days:  No Current discharge risk:  Dependent with Mobility Barriers to Discharge:  Continued Medical Work up, [employer] day 2/3.)   Candie Chroman, LCSW 07/21/2017, 12:48 PM

## 2017-07-21 NOTE — Telephone Encounter (Signed)
Called and spoke with the patient and his wife. He is currently hospitalized and plans for surgery have been put on hold. Will defer cardiac clearance at this time.    Tami Lin Duke, PA-C 07/21/2017, 2:52 PM

## 2017-07-21 NOTE — Progress Notes (Signed)
PROGRESS NOTE    Oscar Baker  TKZ:[id number] DOB: 1932-10-29 DOA: 07/19/2017 PCP: [name], MD   Brief Narrative: 84 y.o.malewith medical history significant ofatrial fibrillation on warfarin, CLL not on any treatment, sick sinus syndrome status post pacer, prostate cancer status post prostatectomy, type 2 diabetes on oral hypoglycemics, bilateral hip replacements who comes in after mechanical fall at home, ear drainage for several days, and sinus congestion and shortness of breath.   Assessment & Plan:   #Fall: CT head was negative for acute finding.  PT recommended skilled nursing home.  Social worker referral.  Continue to provide supportive care.  #Paroxysmal atrial fibrillation with supratherapeutic INR: INR is 3.1 today.  No sign of bleeding.  Refer pharmacist to dose of Coumadin and monitor INR.  #Sick sinus syndrome status post pacemaker: Continue also provide supportive care and telemetry monitoring.  #Chronic systolic CHF with mild exacerbation: Patient with elevated BNP of 727.  Chest x-ray with stable cardiomegaly and pulmonary vascular congestion.  Continue IV Lasix.  Echo showed mildly reduced systolic function with EF of 45-50%.  Daily weight.  #Otitis media with a spontaneous rupture of drum: ENT evaluation ongoing.  Follow-up with ENT immediately after discharge from the hospital for formal audiogram and ear cleaning, per ENT team.  Continue doxycycline and Zosyn for now.  #Hypervolemic hyponatremia in the setting of CHF: Serum osmolality low.  Check urine lites and urine osmolality.  Continue on IV Lasix.  Monitor lab.  #CLL: Has leukocytosis.  Recommended outpatient follow-up with oncology.  #Type 2 diabetes: Continue current insulin regimen.  Monitor blood sugar level.  #Hypertension: On Norvasc, diuretics  #Chronic right hip pain: Patient with history of hip surgery.  Continue PT OT and pain management.  DVT prophylaxis: coumadin Code Status: DNR Family  Communication: No family at bedside Disposition Plan: Likely discharge to SNF in 1-2 days    Consultants:   ENT  Procedures: None Antimicrobials: Zosyn and doxycycline  Subjective: Seen and examined at bedside.  Reported chronic right hip pain.  Shortness of breath is better.  No chest pain.  No nausea or vomiting.  Objective: Vitals:   07/20/17 2006 07/21/17 0034 07/21/17 0624 07/21/17 1252  BP: 127/65 (!) 102/56 115/65 105/66  Pulse: 85 66 (!) 59 67  Resp: 18 18 18 20   Temp: 98.8 F (37.1 C) 99.6 F (37.6 C) 98.4 F (36.9 C) 97.8 F (36.6 C)  TempSrc: Oral Oral Oral Oral  SpO2: 96% 94% 95% 100%  Weight:   94.2 kg (207 lb 10.8 oz)   Height:        Intake/Output Summary (Last 24 hours) at 07/21/2017 1258 Last data filed at 07/21/2017 1011 Gross per 24 hour  Intake 1260 ml  Output 1350 ml  Net -90 ml   Filed Weights   07/19/17 2302 07/20/17 0409 07/21/17 0624  Weight: 93.4 kg (205 lb 14.4 oz) 92.5 kg (204 lb) 94.2 kg (207 lb 10.8 oz)    Examination:  General exam: Appears calm and comfortable  Respiratory system: Bilateral basal crackle, no wheezing. Cardiovascular system: S1 & S2 heard, RRR.  No pedal edema. Gastrointestinal system: Abdomen is nondistended, soft and nontender. Normal bowel sounds heard. Central nervous system: Alert and oriented. No focal neurological deficits. Extremities: Symmetric 5 x 5 power. Skin: No rashes, lesions or ulcers   Data Reviewed: I have personally reviewed following labs and imaging studies  CBC: Recent Labs  Lab 07/19/17 1030 07/20/17 0517 07/21/17 0439  WBC 56.1* 68.9*  70.1*  NEUTROABS 22.4*  --   --   HGB 10.4* 10.2* 9.7*  HCT 31.2* 29.7* 28.2*  MCV 96.3 95.5 94.9  PLT 445* 437* 841*   Basic Metabolic Panel: Recent Labs  Lab 07/19/17 1030 07/20/17 0517 07/21/17 0439  NA 130* 128* 124*  K 4.4 4.1 3.8  CL 100* 98* 92*  CO2 18* 19* 21*  GLUCOSE 294* 247* 232*  BUN 13 19 23*  CREATININE 0.89 1.03 1.14    CALCIUM 8.2* 7.8* 7.5*  MG  --  1.6* 1.9   GFR: Estimated Creatinine Clearance: 57.6 mL/min (by C-G formula based on SCr of 1.14 mg/dL). Liver Function Tests: Recent Labs  Lab 07/19/17 1030  AST 26  ALT 19  ALKPHOS 71  BILITOT 1.0  PROT 5.5*  ALBUMIN 2.4*   No results for input(s): LIPASE, AMYLASE in the last 168 hours. No results for input(s): AMMONIA in the last 168 hours. Coagulation Profile: Recent Labs  Lab 07/19/17 1030 07/20/17 0517 07/21/17 0439  INR 4.39* 5.42* 3.18   Cardiac Enzymes: No results for input(s): CKTOTAL, CKMB, CKMBINDEX, TROPONINI in the last 168 hours. BNP (last 3 results) No results for input(s): PROBNP in the last 8760 hours. HbA1C: No results for input(s): HGBA1C in the last 72 hours. CBG: Recent Labs  Lab 07/20/17 1120 07/20/17 1705 07/20/17 2052 07/21/17 0739 07/21/17 1127  GLUCAP 276* 257* 283* 246* 251*   Lipid Profile: No results for input(s): CHOL, HDL, LDLCALC, TRIG, CHOLHDL, LDLDIRECT in the last 72 hours. Thyroid Function Tests: No results for input(s): TSH, T4TOTAL, FREET4, T3FREE, THYROIDAB in the last 72 hours. Anemia Panel: No results for input(s): VITAMINB12, FOLATE, FERRITIN, TIBC, IRON, RETICCTPCT in the last 72 hours. Sepsis Labs: Recent Labs  Lab 07/19/17 1052 07/19/17 1913  LATICACIDVEN 1.22 2.77*    Recent Results (from the past 240 hour(s))  Urine culture     Status: Abnormal   Collection Time: 07/19/17  3:33 PM  Result Value Ref Range Status   Specimen Description URINE, RANDOM  Final   Special Requests NONE  Final   Culture >=100,000 COLONIES/mL STAPHYLOCOCCUS AUREUS (A)  Final   Report Status 07/21/2017 FINAL  Final   Organism ID, Bacteria STAPHYLOCOCCUS AUREUS (A)  Final      Susceptibility   Staphylococcus aureus - MIC*    CIPROFLOXACIN <=0.5 SENSITIVE Sensitive     GENTAMICIN <=0.5 SENSITIVE Sensitive     NITROFURANTOIN <=16 SENSITIVE Sensitive     OXACILLIN 0.5 SENSITIVE Sensitive      TETRACYCLINE <=1 SENSITIVE Sensitive     VANCOMYCIN 1 SENSITIVE Sensitive     TRIMETH/SULFA <=10 SENSITIVE Sensitive     CLINDAMYCIN <=0.25 SENSITIVE Sensitive     RIFAMPIN <=0.5 SENSITIVE Sensitive     Inducible Clindamycin NEGATIVE Sensitive     * >=100,000 COLONIES/mL STAPHYLOCOCCUS AUREUS  Wound or Superficial Culture     Status: None (Preliminary result)   Collection Time: 07/19/17  4:53 PM  Result Value Ref Range Status   Specimen Description EAR RIGHT  Final   Special Requests Normal  Final   Gram Stain   Final    FEW WBC PRESENT, PREDOMINANTLY PMN ABUNDANT GRAM POSITIVE COCCI MODERATE GRAM POSITIVE RODS    Culture TOO YOUNG TO READ  Final   Report Status PENDING  Incomplete         Radiology Studies: Ct Head Wo Contrast  Result Date: 07/20/2017 CLINICAL DATA:  Initial evaluation for acute head trauma, fall at home. Headache and  ataxia. Ear drainage for several days EXAM: CT HEAD WITHOUT CONTRAST TECHNIQUE: Contiguous axial images were obtained from the base of the skull through the vertex without intravenous contrast. COMPARISON:  Prior CT from 07/19/2017. FINDINGS: Brain: Stable atrophy with chronic microvascular ischemic disease. No acute intracranial hemorrhage. No acute large vessel territory infarct. No mass lesion, midline shift or mass effect. No hydrocephalus. No extra-axial fluid collection. Vascular: No hyperdense vessel. Scattered vascular calcifications noted within the carotid siphons. Skull: Small evolving right parietal scalp contusion/laceration. Skin staples in place. Calvarium intact. Sinuses/Orbits: Globes and orbital soft tissues within normal limits. Patient status post lens extraction bilaterally. Other: Moderate to severe opacity throughout the paranasal sinuses with superimposed air-fluid levels, suggesting acute sinusitis. Right mastoid air cells and middle ear cavity are largely opacified. No discernible fracture. IMPRESSION: 1. Stable appearance of the  brain. No acute intracranial abnormality. 2. Small evolving right parietal scalp contusion/laceration with skin staples in place. No calvarial fracture. 3. Acute pan sinusitis. Right mastoid effusion with opacification of the right middle ear cavity. Clinical correlation for possible acute otomastoiditis recommended. Electronically Signed   By: Jeannine Boga M.D.   On: 07/20/2017 20:49   Ct Head Wo Contrast  Result Date: 07/19/2017 CLINICAL DATA:  Fall, head/C-spine trauma EXAM: CT HEAD WITHOUT CONTRAST CT CERVICAL SPINE WITHOUT CONTRAST TECHNIQUE: Multidetector CT imaging of the head and cervical spine was performed following the standard protocol without intravenous contrast. Multiplanar CT image reconstructions of the cervical spine were also generated. COMPARISON:  None. FINDINGS: CT HEAD FINDINGS Brain: No evidence of acute infarction, hemorrhage, hydrocephalus, extra-axial collection or mass lesion/mass effect. Subcortical white matter and periventricular small vessel ischemic changes. Vascular: Intracranial atherosclerosis. Skull: Normal. Negative for fracture or focal lesion. Sinuses/Orbits: Partial opacification of the right greater than left frontal sinuses. Near complete opacification of the right greater than left ethmoid sinuses. Partial opacification of the bilateral sphenoid sinuses. Near complete opacification of the right maxillary sinus. Partial opacification of the left maxillary sinus. Partial opacification of the right mastoid air cells. Other: Mild soft tissue swelling overlying the right parietal scalp (series 3/image 25). CT CERVICAL SPINE FINDINGS Alignment: Normal cervical lordosis. Skull base and vertebrae: No acute fracture. No primary bone lesion or focal pathologic process. Soft tissues and spinal canal: No prevertebral fluid or swelling. No visible canal hematoma. Multiple small bilateral cervical lymph nodes measuring up to 10 mm short axis, likely related to the patient's  history of CLL. Disc levels:  Moderate multilevel degenerative changes. Spinal canal is patent. Upper chest: Visualized lung apices are clear. Other: Visualized thyroid is grossly unremarkable. IMPRESSION: Mild soft tissue swelling overlying the right parietal scalp. No evidence of acute intracranial abnormality. Small vessel ischemic changes. Extensive paranasal sinus disease, as above. No evidence of traumatic injury to the cervical spine. Moderate multilevel degenerative changes. Multiple small bilateral cervical lymph nodes measuring up to 10 mm short axis, likely related to the patient's history of CLL. Electronically Signed   By: Julian Hy M.D.   On: 07/19/2017 13:31   Ct Cervical Spine Wo Contrast  Result Date: 07/19/2017 CLINICAL DATA:  Fall, head/C-spine trauma EXAM: CT HEAD WITHOUT CONTRAST CT CERVICAL SPINE WITHOUT CONTRAST TECHNIQUE: Multidetector CT imaging of the head and cervical spine was performed following the standard protocol without intravenous contrast. Multiplanar CT image reconstructions of the cervical spine were also generated. COMPARISON:  None. FINDINGS: CT HEAD FINDINGS Brain: No evidence of acute infarction, hemorrhage, hydrocephalus, extra-axial collection or mass lesion/mass effect. Subcortical white  matter and periventricular small vessel ischemic changes. Vascular: Intracranial atherosclerosis. Skull: Normal. Negative for fracture or focal lesion. Sinuses/Orbits: Partial opacification of the right greater than left frontal sinuses. Near complete opacification of the right greater than left ethmoid sinuses. Partial opacification of the bilateral sphenoid sinuses. Near complete opacification of the right maxillary sinus. Partial opacification of the left maxillary sinus. Partial opacification of the right mastoid air cells. Other: Mild soft tissue swelling overlying the right parietal scalp (series 3/image 25). CT CERVICAL SPINE FINDINGS Alignment: Normal cervical  lordosis. Skull base and vertebrae: No acute fracture. No primary bone lesion or focal pathologic process. Soft tissues and spinal canal: No prevertebral fluid or swelling. No visible canal hematoma. Multiple small bilateral cervical lymph nodes measuring up to 10 mm short axis, likely related to the patient's history of CLL. Disc levels:  Moderate multilevel degenerative changes. Spinal canal is patent. Upper chest: Visualized lung apices are clear. Other: Visualized thyroid is grossly unremarkable. IMPRESSION: Mild soft tissue swelling overlying the right parietal scalp. No evidence of acute intracranial abnormality. Small vessel ischemic changes. Extensive paranasal sinus disease, as above. No evidence of traumatic injury to the cervical spine. Moderate multilevel degenerative changes. Multiple small bilateral cervical lymph nodes measuring up to 10 mm short axis, likely related to the patient's history of CLL. Electronically Signed   By: Julian Hy M.D.   On: 07/19/2017 13:31   Dg Chest Port 1 View  Result Date: 07/21/2017 CLINICAL DATA:  Shortness of breath. EXAM: PORTABLE CHEST 1 VIEW COMPARISON:  Radiographs of July 19, 2017. FINDINGS: Stable cardiomediastinal silhouette. Single lead left-sided pacemaker is unchanged in position. Mild central pulmonary vascular congestion is noted. No acute pulmonary disease is noted. No pneumothorax or pleural effusion is noted. Bony thorax is unremarkable. IMPRESSION: Stable cardiomegaly with central pulmonary vascular congestion. No significant change compared to prior exam. Electronically Signed   By: Marijo Conception, M.D.   On: 07/21/2017 12:52   Ct Temporal Bones W Contrast  Result Date: 07/19/2017 CLINICAL DATA:  Fall. Abrasion to right side of head. Drainage from right ear. EXAM: CT TEMPORAL BONES WITH CONTRAST TECHNIQUE: Axial and coronal plane CT imaging of the petrous temporal bones was performed with thin-collimation image reconstruction after  intravenous contrast administration. Multiplanar CT image reconstructions were also generated. CONTRAST:  79mL ISOVUE-300 IOPAMIDOL (ISOVUE-300) INJECTION 61% COMPARISON:  CT head without contrast from the same day. Limited sinus CT 04/08/2015. FINDINGS: Limited postcontrast imaging the brain is within normal limits. Globes and orbits are within normal limits. Chronic bilateral maxillary sinus disease is present, right greater than left. There is diffuse ethmoid and inferior frontal sinus disease. A right frontal sinus effusion is noted. There are fluid levels in the sphenoid sinuses bilaterally. The right external auditory canal is filled with soft tissue and fluid. There is fluid or soft tissue within the right middle ear canal, surrounding the middle ear ossicles. Diffuse mastoid effusion is present. There is no associated fracture. The oval window is open. Inner ear structures are normally formed. Lateral and superior semicircular canals are covered. Facial nerve canal is covered. The internal auditory canal and vestibular aqueduct are normal. There is no coalescence of mastoid air cells or evidence for subperiosteal abscess. The left external auditory canal is clear. Tympanic membrane is visualized and normal. The left middle ear ossicles are normally formed and articulating. The oval window is patent. The mastoid air cells are clear. The inner ear structures are normally formed. Lateral and superior semicircular  canals are covered. The facial nerve canal is covered. The internal auditory canal and vestibular aqueduct are normal. IMPRESSION: 1. Extensive soft tissue swelling in fluid in the right external auditory canal compatible with otitis externa. No bone destruction is present. 2. Right middle ear effusion and mastoid effusion is present as well. 3. No coalescence of mastoid air cells or subperiosteal abscess. 4. Left temporal bone is within normal limits. 5. Extensive acute on chronic sinus disease.  Electronically Signed   By: San Morelle M.D.   On: 07/19/2017 13:43        Scheduled Meds: . amLODipine  10 mg Oral QHS  . aspirin EC  81 mg Oral QHS  . atorvastatin  20 mg Oral q1800  . docusate sodium  100 mg Oral BID  . fluticasone  2 spray Each Nare Daily  . furosemide  40 mg Intravenous BID  . insulin aspart  0-9 Units Subcutaneous TID WC  . multivitamin with minerals  1 tablet Oral Daily  . neomycin-polymyxin-hydrocortisone  3 drop Right EAR Q6H  . sodium chloride flush  3 mL Intravenous Q12H  . vitamin B-12  1,000 mcg Oral Daily   Continuous Infusions: . sodium chloride    . doxycycline (VIBRAMYCIN) IV Stopped (07/21/17 7482)  . piperacillin-tazobactam (ZOSYN)  IV 3.375 g (07/21/17 0957)     LOS: 2 days    Derryl Uher Tanna Furry, MD [facility] Pager [phone]  If 7PM-7AM, please contact night-coverage [email or web address] Password [facility] 07/21/2017, 12:58 PM

## 2017-07-21 NOTE — Clinical Social Work Placement (Signed)
   CLINICAL SOCIAL WORK PLACEMENT  NOTE  Date:  07/21/2017  Patient Details  Name: Oscar Baker MRN: [id number] Date of Birth: 1933/01/11  Clinical Social Work is seeking post-discharge placement for this patient at the [place] level of care (*CSW will initial, date and re-position this form in  chart as items are completed):  Yes   Patient/family provided with [facility]'s list of facilities offering this level of care within the geographic area requested by the patient (or if unable, by the patient's family).  Yes   Patient/family informed of their freedom to choose among providers that offer the needed level of care, that participate in Medicare, Medicaid or managed care program needed by the patient, have an available bed and are willing to accept the patient.  Yes   Patient/family informed of 's ownership interest in [facility] and [facility], as well as of the fact that they are under no obligation to receive care at these facilities.  PASRR submitted to EDS on 07/21/17     PASRR number received on 07/21/17     Existing PASRR number confirmed on       FL2 transmitted to all facilities in geographic area requested by pt/family on 07/21/17     FL2 transmitted to all facilities within larger geographic area on       Patient informed that his/her managed care company has contracts with or will negotiate with certain facilities, including the following:            Patient/family informed of bed offers received.  Patient chooses bed at       Physician recommends and patient chooses bed at      Patient to be transferred to   on  .  Patient to be transferred to facility by       Patient family notified on   of transfer.  Name of family member notified:        PHYSICIAN Please sign FL2     Additional Comment:    _______________________________________________ Candie Chroman, LCSW 07/21/2017,  12:51 PM

## 2017-07-21 NOTE — Progress Notes (Signed)
[facility] for warfarin Indication: atrial fibrillation  Allergies  Allergen Reactions  . Altace [Ramipril] Other (See Comments)    "throat felt like had a knot in it"  . Codeine Nausea And Vomiting    Nausea and vomiting   . Simvastatin Other (See Comments)    Leg aches    Patient Measurements: Height: 5\' 10"  (177.8 cm) Weight: 207 lb 10.8 oz (94.2 kg) IBW/kg (Calculated) : 73   Labs: Recent Labs    07/19/17 1030 07/20/17 0517 07/21/17 0439  HGB 10.4* 10.2* 9.7*  HCT 31.2* 29.7* 28.2*  PLT 445* 437* 456*  LABPROT 41.6* 49.1* 32.4*  INR 4.39* 5.42* 3.18  CREATININE 0.89 1.03 1.14    Estimated Creatinine Clearance: 57.6 mL/min (by C-G formula based on SCr of 1.14 mg/dL).  Assessment: 28 YOM on warfarin PTA for AFib. Admitted with a fall- CT negative. INR was elevated on admission, down to 3.18 today. Home dose of warfarin is 5mg  daily.    Goal of Therapy:  INR 2-2.5 per protocol Monitor platelets by anticoagulation protocol: Yes   Plan:  Warfarin 2mg  po x1 tonight to prevent INR from dropping below goal Daily INR Follow for s/s bleeding  Kineta Fudala D. Debbra Digiulio, PharmD, BCPS Clinical Pharmacist 07/21/2017 2:32 PM

## 2017-07-21 NOTE — Progress Notes (Signed)
Pt. Requesting med to help him sleep. On call for [facility] paged to make aware.  

## 2017-07-21 NOTE — NC FL2 (Signed)
[place] LEVEL OF CARE SCREENING TOOL     IDENTIFICATION  Patient Name: Oscar Baker Birthdate: 1932/10/21 Sex: male Admission Date (Current Location): 07/19/2017  [facility] and [place] Number:  Herbalist and Address:  [facility], [place] [address] [place], [place] [address]      Provider Number: [id number]  Attending Physician Name and Address:  Rosita Fire, MD  Relative Name and Phone Number:       Current Level of Care: Hospital Recommended Level of Care: [place] Prior Approval Number:    Date Approved/Denied:   PASRR Number: [id number]  Discharge Plan: SNF    Current Diagnoses: Patient Active Problem List   Diagnosis Date Noted  . Pressure injury of skin 07/20/2017  . Malignant otitis externa 07/19/2017  . Fall at home, initial encounter 07/19/2017  . Acute on chronic diastolic CHF (congestive heart failure) ([place]) 07/19/2017  . Acute on chronic diastolic heart failure ([place]) 07/19/2017  . S/P left THA, AA 05/12/2016  . LPRD (laryngopharyngeal reflux disease) 05/21/2015  . Other allergic rhinitis 05/21/2015  . Obesity 12/12/2014  . Upper airway cough syndrome 12/11/2014  . Cough variant asthma 07/27/2014  . Wheezing 06/20/2014  . Symptomatic bradycardia - s/p Biotronik (serial number [id number]) 02/08/2014  . Diabetes mellitus type 2, noninsulin dependent ([place])   . Edema of foot 03/06/2013  . Traumatic ecchymosis of right foot 03/06/2013  . Bone spur 12/30/2012  . Pain of toe of left foot 12/30/2012  . Hyperlipidemia 07/13/2012  . HTN (hypertension) 04/12/2012  . Sick sinus syndrome ([place]) 08/17/2011  . Atrial fibrillation ([place]) 09/16/2010    Orientation RESPIRATION BLADDER Height & Weight     Self, Time, Situation, Place  O2(Nasal Canula 2 L) Continent, External catheter Weight: 207 lb 10.8 oz (94.2 kg) Height:  5\' 10"  (177.8 cm)  BEHAVIORAL SYMPTOMS/MOOD NEUROLOGICAL BOWEL  NUTRITION STATUS  (None) (None) Continent Diet(2 gram sodium)  AMBULATORY STATUS COMMUNICATION OF NEEDS Skin   Limited Assist Verbally Bruising, Other (Comment), PU Stage and Appropriate Care(Skin tear/Laceration.)   PU Stage 2 Dressing: (Sacrum: Foam.)                   Personal Care Assistance Level of Assistance              Functional Limitations Info  Sight, Hearing, Speech Sight Info: Adequate Hearing Info: Adequate Speech Info: Adequate    SPECIAL CARE FACTORS FREQUENCY  PT (By licensed PT)     PT Frequency: 5 x week              Contractures Contractures Info: Not present    Additional Factors Info  Code Status, Allergies Code Status Info: DNR Allergies Info: Altace (Ramipril), Codeine, Simvastatin           Current Medications (07/21/2017):  This is the current hospital active medication list Current Facility-Administered Medications  Medication Dose Route Frequency Provider Last Rate Last Dose  . 0.9 %  sodium chloride infusion  250 mL Intravenous PRN Purohit, Shrey C, MD      . acetaminophen (TYLENOL) tablet 650 mg  650 mg Oral Q4H PRN Purohit, Shrey C, MD      . amLODipine (NORVASC) tablet 10 mg  10 mg Oral QHS Purohit, Shrey C, MD   10 mg at 07/20/17 2044  . aspirin EC tablet 81 mg  81 mg Oral QHS Purohit, Shrey C, MD   81 mg at 07/20/17  2044  . atorvastatin (LIPITOR) tablet 20 mg  20 mg Oral q1800 Purohit, Shrey C, MD   20 mg at 07/20/17 1759  . docusate sodium (COLACE) capsule 100 mg  100 mg Oral BID Purohit, Shrey C, MD   100 mg at 07/21/17 1006  . doxycycline (VIBRAMYCIN) 100 mg in dextrose 5 % 250 mL IVPB  100 mg Intravenous Q12H Doristine Devoid, PA-C   Stopped at 07/21/17 2671  . fluticasone (FLONASE) 50 MCG/ACT nasal spray 2 spray  2 spray Each Nare Daily Jodi Marble, MD   2 spray at 07/21/17 1008  . furosemide (LASIX) injection 40 mg  40 mg Intravenous BID Purohit, Shrey C, MD   40 mg at 07/21/17 0958  . guaiFENesin-dextromethorphan  (ROBITUSSIN DM) 100-10 MG/5ML syrup 5 mL  5 mL Oral Q4H PRN Purohit, Shrey C, MD   5 mL at 07/21/17 1202  . insulin aspart (novoLOG) injection 0-9 Units  0-9 Units Subcutaneous TID WC Purohit, Konrad Dolores, MD   5 Units at 07/21/17 1237  . lidocaine (XYLOCAINE) 2 % jelly 1 application  1 application Topical Once PRN Jodi Marble, MD      . lidocaine (XYLOCAINE) 4 % external solution 0-50 mL  0-50 mL Topical Once PRN Jodi Marble, MD      . lidocaine-EPINEPHrine (XYLOCAINE-EPINEPHrine) 1 %-1:200000 (PF) injection 0-30 mL  0-30 mL Intradermal Once PRN Jodi Marble, MD      . multivitamin with minerals tablet 1 tablet  1 tablet Oral Daily Purohit, Konrad Dolores, MD   1 tablet at 07/21/17 1006  . neomycin-polymyxin-hydrocortisone (CORTISPORIN) OTIC (EAR) suspension 3 drop  3 drop Right EAR I4P Jodi Marble, MD   3 drop at 07/21/17 1202  . ondansetron (ZOFRAN) injection 4 mg  4 mg Intravenous Q6H PRN Purohit, Shrey C, MD      . oxymetazoline (AFRIN) 0.05 % nasal spray 1 spray  1 spray Each Nare Once PRN Jodi Marble, MD      . piperacillin-tazobactam (ZOSYN) IVPB 3.375 g  3.375 g Intravenous Q8H Ocie Cornfield T, PA-C 12.5 mL/hr at 07/21/17 0957 3.375 g at 07/21/17 0957  . silver nitrate applicators applicator 1 Stick  1 Stick Topical Once PRN Jodi Marble, MD      . sodium chloride (OCEAN) 0.65 % nasal spray 2 spray  2 spray Each Nare PRN Jodi Marble, MD      . sodium chloride flush (NS) 0.9 % injection 3 mL  3 mL Intravenous Q12H Purohit, Shrey C, MD   3 mL at 07/21/17 1008  . sodium chloride flush (NS) 0.9 % injection 3 mL  3 mL Intravenous PRN Purohit, Shrey C, MD      . traMADol (ULTRAM) tablet 50 mg  50 mg Oral Q6H PRN Purohit, Konrad Dolores, MD   50 mg at 07/20/17 2044  . TRIPLE ANTIBIOTIC [phone] OINT 1 application  1 application Apply externally Once PRN Jodi Marble, MD      . vitamin B-12 (CYANOCOBALAMIN) tablet 1,000 mcg  1,000 mcg Oral Daily Purohit, Konrad Dolores, MD   1,000 mcg at  07/21/17 1006     Discharge Medications: Please see discharge summary for a list of discharge medications.  Relevant Imaging Results:  Relevant Lab Results:   Additional Information SS#: [id number]  Candie Chroman, LCSW

## 2017-07-21 NOTE — Progress Notes (Signed)
07/21/2017 10:11 AM  Oscar Baker [id number]  Hosp Day 3    Temp:  [98.2 F (36.8 C)-99.6 F (37.6 C)] 98.4 F (36.9 C) (01/30 0624) Pulse Rate:  [54-85] 59 (01/30 0624) Resp:  [18-22] 18 (01/30 0624) BP: (102-138)/(56-97) 115/65 (01/30 0624) SpO2:  [94 %-96 %] 95 % (01/30 0624) Weight:  [94.2 kg (207 lb 10.8 oz)] 94.2 kg (207 lb 10.8 oz) (01/30 0624),     Intake/Output Summary (Last 24 hours) at 07/21/2017 1011 Last data filed at 07/21/2017 1011 Gross per 24 hour  Intake 1260 ml  Output 1350 ml  Net -90 ml    Results for orders placed or performed during the hospital encounter of 07/19/17 (from the past 24 hour(s))  Glucose, capillary     Status: Abnormal   Collection Time: 07/20/17 11:20 AM  Result Value Ref Range   Glucose-Capillary 276 (H) 65 - 99 mg/dL   Comment 1 Notify RN   Glucose, capillary     Status: Abnormal   Collection Time: 07/20/17  5:05 PM  Result Value Ref Range   Glucose-Capillary 257 (H) 65 - 99 mg/dL   Comment 1 Notify RN   Glucose, capillary     Status: Abnormal   Collection Time: 07/20/17  8:52 PM  Result Value Ref Range   Glucose-Capillary 283 (H) 65 - 99 mg/dL   Comment 1 Notify RN    Comment 2 Document in Chart   Basic metabolic panel     Status: Abnormal   Collection Time: 07/21/17  4:39 AM  Result Value Ref Range   Sodium 124 (L) 135 - 145 mmol/L   Potassium 3.8 3.5 - 5.1 mmol/L   Chloride 92 (L) 101 - 111 mmol/L   CO2 21 (L) 22 - 32 mmol/L   Glucose, Bld 232 (H) 65 - 99 mg/dL   BUN 23 (H) 6 - 20 mg/dL   Creatinine, Ser 1.14 0.61 - 1.24 mg/dL   Calcium 7.5 (L) 8.9 - 10.3 mg/dL   GFR calc non Af Amer 58 (L) >60 mL/min   GFR calc Af Amer >60 >60 mL/min   Anion gap 11 5 - 15  CBC     Status: Abnormal   Collection Time: 07/21/17  4:39 AM  Result Value Ref Range   WBC 70.1 (HH) 4.0 - 10.5 K/uL   RBC 2.97 (L) 4.22 - 5.81 MIL/uL   Hemoglobin 9.7 (L) 13.0 - 17.0 g/dL   HCT 28.2 (L) 39.0 - 52.0 %   MCV 94.9 78.0 - 100.0 fL   MCH 32.7  26.0 - 34.0 pg   MCHC 34.4 30.0 - 36.0 g/dL   RDW 18.5 (H) 11.5 - 15.5 %   Platelets 456 (H) 150 - 400 K/uL  Magnesium     Status: None   Collection Time: 07/21/17  4:39 AM  Result Value Ref Range   Magnesium 1.9 1.7 - 2.4 mg/dL  Protime-INR     Status: Abnormal   Collection Time: 07/21/17  4:39 AM  Result Value Ref Range   Prothrombin Time 32.4 (H) 11.4 - 15.2 seconds   INR 3.18   Glucose, capillary     Status: Abnormal   Collection Time: 07/21/17  7:39 AM  Result Value Ref Range   Glucose-Capillary 246 (H) 65 - 99 mg/dL   Comment 1 Notify RN    Culture: gram neg rods and gram pos cocci.  SUBJECTIVE:  Less ear pain. Still unable to get out of bed by himself  OBJECTIVE:  Less RIGHT ear drainage.  Still nystagmus to LEFT.  Facial n intact  IMPRESSION:  Otitis media with spont rupture of drum.  Possible inner ear damage with nystagmus and possible balance issues.   PLAN:  Await cultures.  Need to get him to my office promptly upon discharge for a formal audiogram and ear cleaning.   Jodi Marble

## 2017-07-22 LAB — RENAL FUNCTION PANEL
ALBUMIN: 1.8 g/dL — AB (ref 3.5–5.0)
ANION GAP: 14 (ref 5–15)
BUN: 22 mg/dL — ABNORMAL HIGH (ref 6–20)
CO2: 22 mmol/L (ref 22–32)
Calcium: 7.8 mg/dL — ABNORMAL LOW (ref 8.9–10.3)
Chloride: 90 mmol/L — ABNORMAL LOW (ref 101–111)
Creatinine, Ser: 0.9 mg/dL (ref 0.61–1.24)
GLUCOSE: 223 mg/dL — AB (ref 65–99)
PHOSPHORUS: 1.7 mg/dL — AB (ref 2.5–4.6)
POTASSIUM: 4 mmol/L (ref 3.5–5.1)
SODIUM: 126 mmol/L — AB (ref 135–145)

## 2017-07-22 LAB — GLUCOSE, CAPILLARY
Glucose-Capillary: 233 mg/dL — ABNORMAL HIGH (ref 65–99)
Glucose-Capillary: 280 mg/dL — ABNORMAL HIGH (ref 65–99)
Glucose-Capillary: 319 mg/dL — ABNORMAL HIGH (ref 65–99)
Glucose-Capillary: 284 mg/dL — ABNORMAL HIGH (ref 65–99)

## 2017-07-22 LAB — PROTIME-INR
INR: 2.1
Prothrombin Time: 23.4 seconds — ABNORMAL HIGH (ref 11.4–15.2)

## 2017-07-22 LAB — AEROBIC CULTURE W GRAM STAIN (SUPERFICIAL SPECIMEN)

## 2017-07-22 LAB — MAGNESIUM: MAGNESIUM: 1.8 mg/dL (ref 1.7–2.4)

## 2017-07-22 LAB — AEROBIC CULTURE  (SUPERFICIAL SPECIMEN): SPECIAL REQUESTS: NORMAL

## 2017-07-22 LAB — TSH: TSH: 0.436 u[IU]/mL (ref 0.350–4.500)

## 2017-07-22 MED ORDER — POTASSIUM PHOSPHATE MONOBASIC 500 MG PO TABS: 500.0000 mg | ORAL_TABLET | Freq: Three times a day (TID) | ORAL | Status: DC

## 2017-07-22 MED ORDER — METHYLPREDNISOLONE 4 MG PO TBPK: 4.0000 mg | ORAL_TABLET | ORAL | Status: DC

## 2017-07-22 MED ORDER — METHYLPREDNISOLONE 4 MG PO TBPK
4.0000 mg | ORAL_TABLET | Freq: Three times a day (TID) | ORAL | Status: DC
  Administered 2017-07-23 (×2): 4 mg via ORAL

## 2017-07-22 MED ORDER — WARFARIN SODIUM 3 MG PO TABS
3.0000 mg | ORAL_TABLET | Freq: Once | ORAL | Status: AC
  Administered 2017-07-22: 3 mg via ORAL
  Filled 2017-07-22: qty 1

## 2017-07-22 MED ORDER — METHYLPREDNISOLONE 4 MG PO TBPK: 8.0000 mg | ORAL_TABLET | Freq: Every morning | ORAL | Status: DC

## 2017-07-22 MED ORDER — MAGNESIUM SULFATE 2 GM/50ML IV SOLN
2.0000 g | Freq: Once | INTRAVENOUS | Status: AC
  Administered 2017-07-22: 2 g via INTRAVENOUS
  Filled 2017-07-22: qty 50

## 2017-07-22 MED ORDER — METHYLPREDNISOLONE 4 MG PO TBPK
12.0000 mg | ORAL_TABLET | ORAL | Status: DC
  Filled 2017-07-22: qty 21

## 2017-07-22 MED ORDER — METHYLPREDNISOLONE 4 MG PO TBPK: 4.0000 mg | ORAL_TABLET | Freq: Four times a day (QID) | ORAL | Status: DC

## 2017-07-22 MED ORDER — K PHOS MONO-SOD PHOS DI & MONO 155-852-130 MG PO TABS
500.0000 mg | ORAL_TABLET | Freq: Three times a day (TID) | ORAL | Status: DC
  Administered 2017-07-22 – 2017-07-23 (×3): 500 mg via ORAL
  Filled 2017-07-22 (×4): qty 2

## 2017-07-22 MED ORDER — ALBUMIN HUMAN 25 % IV SOLN
25.0000 g | Freq: Four times a day (QID) | INTRAVENOUS | Status: AC
  Administered 2017-07-22 – 2017-07-23 (×3): 25 g via INTRAVENOUS
  Filled 2017-07-22 (×3): qty 100

## 2017-07-22 MED ORDER — METHYLPREDNISOLONE 4 MG PO TBPK
12.0000 mg | ORAL_TABLET | Freq: Every evening | ORAL | Status: AC
  Administered 2017-07-22: 12 mg via ORAL

## 2017-07-22 MED ORDER — LORAZEPAM 2 MG/ML IJ SOLN: 0.5000 mg | Freq: Once | INTRAMUSCULAR | Status: DC

## 2017-07-22 MED ORDER — ZOLPIDEM TARTRATE 5 MG PO TABS
5.0000 mg | ORAL_TABLET | Freq: Every evening | ORAL | Status: DC | PRN
  Administered 2017-07-22: 5 mg via ORAL
  Filled 2017-07-22: qty 1

## 2017-07-22 MED ORDER — METHYLPREDNISOLONE 4 MG PO TBPK: 8.0000 mg | ORAL_TABLET | Freq: Every evening | ORAL | Status: DC

## 2017-07-22 NOTE — Progress Notes (Signed)
Patient ID: Oscar Baker, male   DOB: 1933/02/24, 84 y.o.   MRN: [id number]  Spoke with Dr. Alvan Dame regarding hip pain. Given no acute process on x-ray no acute intervention needed. Suggested Medrol dose pack and outpatient follow up once he is discharged.    Lisette Abu, PA-C Orthopedic Surgery [phone]

## 2017-07-22 NOTE — Plan of Care (Signed)
Pt. Able to assist with bed mobility with minimal assistance.

## 2017-07-22 NOTE — Plan of Care (Signed)
  Health Behavior/Discharge Planning: Ability to manage health-related needs will improve 07/22/2017 1607 - Not Progressing by Imagene Gurney, RN

## 2017-07-22 NOTE — Progress Notes (Signed)
PROGRESS NOTE    Oscar Baker  JJH:[id number] DOB: 1933/03/31 DOA: 07/19/2017 PCP: [name], MD   Brief Narrative: 84 y.o.malewith medical history significant ofatrial fibrillation on warfarin, CLL not on any treatment, sick sinus syndrome status post pacer, prostate cancer status post prostatectomy, type 2 diabetes on oral hypoglycemics, bilateral hip replacements who comes in after mechanical fall at home, ear drainage for several days, and sinus congestion and shortness of breath.   Assessment & Plan:   #Fall: CT head was negative for acute finding.  PT recommended skilled nursing home.  Social worker referral.  Continue to provide supportive care.  #Paroxysmal atrial fibrillation with supratherapeutic INR on admission:  -INR 2.1 today.  He has dark colored urine.  Monitor.  Resuming Coumadin.  Discussed with the patient and family.  #Sick sinus syndrome status post pacemaker: Continue also provide supportive care and telemetry monitoring.  #Chronic systolic CHF with mild exacerbation: Patient with elevated BNP of 727.  Chest x-ray with stable cardiomegaly and pulmonary vascular congestion.  Continue IV Lasix.  Echo showed mildly reduced systolic function with EF of 45-50%.  Daily weight. -Shortness of breath he is not at baseline.  Plan to continue IV Lasix today.  Replete magnesium sulfate.  #Hypophosphatemia: Order K-Phos.  Monitor lab.  #Otitis media with a spontaneous rupture of drum: ENT evaluation ongoing.  Follow-up with ENT immediately after discharge from the hospital for formal audiogram and ear cleaning, per ENT team.  Continue doxycycline and Zosyn for now.  Follow-up final culture result.  #Hypervolemic hyponatremia in the setting of CHF: Serum sodium level 126.  Patient with very low albumin, order 2 doses of albumin.  Continue on IV Lasix.  Monitor lab.  #CLL: Has leukocytosis.  Recommended outpatient follow-up with oncology.  #Type 2 diabetes: Continue current  insulin regimen.  Monitor blood sugar level.  #Hypertension: On Norvasc, diuretics  #Chronic right hip pain: Patient had a bilateral hip repair in the past.  As per patient and family, the hip device was recalled by the company however watching conservatively by his orthopedist.  He has difficulty walking because of the pain.  Orthopedics consult requested to evaluate.  X-ray with no acute finding.  Continue PT OT evaluation.   DVT prophylaxis: coumadin Code Status: DNR Family Communication: Patient is a brother and wife at bedside.  Disposition Plan: Likely discharge to SNF in 1-2 days    Consultants:   ENT  Orthopedics  Procedures: None Antimicrobials: Zosyn and doxycycline  Subjective: Seen and examined at bedside.  Continue to complain of right hip pain and difficulty walking.  Shortness of breath is better but not at baseline.  No chest pain.  No nausea vomiting.  Family at bedside.  Objective: Vitals:   07/21/17 0624 07/21/17 1252 07/21/17 2016 07/22/17 0427  BP: 115/65 105/66 (!) 117/54 (!) 123/55  Pulse: (!) 59 67 70 (!) 58  Resp: 18 20 20 18   Temp: 98.4 F (36.9 C) 97.8 F (36.6 C) 99.1 F (37.3 C) 99.2 F (37.3 C)  TempSrc: Oral Oral Oral Oral  SpO2: 95% 100% 96% 95%  Weight: 94.2 kg (207 lb 10.8 oz)   93.7 kg (206 lb 9.1 oz)  Height:        Intake/Output Summary (Last 24 hours) at 07/22/2017 1111 Last data filed at 07/22/2017 0915 Gross per 24 hour  Intake 1775 ml  Output 3200 ml  Net -1425 ml   Filed Weights   07/20/17 0409 07/21/17 8185 07/22/17 6314  Weight: 92.5 kg (204 lb) 94.2 kg (207 lb 10.8 oz) 93.7 kg (206 lb 9.1 oz)    Examination:  General exam: Not in distress Respiratory system: Basal rhonchi, no wheezing Cardiovascular system: Regular rate rhythm S1-S2 normal.  Bilateral.  Trace edema.. Gastrointestinal system: Abdomen is nondistended, soft and nontender. Normal bowel sounds heard. Central nervous system: Alert and oriented. No focal  neurological deficits. Extremities: Symmetric 5 x 5 power. Skin: No rashes, lesions or ulcers   Data Reviewed: I have personally reviewed following labs and imaging studies  CBC: Recent Labs  Lab 07/19/17 1030 07/20/17 0517 07/21/17 0439  WBC 56.1* 68.9* 70.1*  NEUTROABS 22.4*  --   --   HGB 10.4* 10.2* 9.7*  HCT 31.2* 29.7* 28.2*  MCV 96.3 95.5 94.9  PLT 445* 437* 161*   Basic Metabolic Panel: Recent Labs  Lab 07/19/17 1030 07/20/17 0517 07/21/17 0439 07/22/17 0541  NA 130* 128* 124* 126*  K 4.4 4.1 3.8 4.0  CL 100* 98* 92* 90*  CO2 18* 19* 21* 22  GLUCOSE 294* 247* 232* 223*  BUN 13 19 23* 22*  CREATININE 0.89 1.03 1.14 0.90  CALCIUM 8.2* 7.8* 7.5* 7.8*  MG  --  1.6* 1.9 1.8  PHOS  --   --   --  1.7*   GFR: Estimated Creatinine Clearance: 72.8 mL/min (by C-G formula based on SCr of 0.9 mg/dL). Liver Function Tests: Recent Labs  Lab 07/19/17 1030 07/22/17 0541  AST 26  --   ALT 19  --   ALKPHOS 71  --   BILITOT 1.0  --   PROT 5.5*  --   ALBUMIN 2.4* 1.8*   No results for input(s): LIPASE, AMYLASE in the last 168 hours. No results for input(s): AMMONIA in the last 168 hours. Coagulation Profile: Recent Labs  Lab 07/19/17 1030 07/20/17 0517 07/21/17 0439 07/22/17 0541  INR 4.39* 5.42* 3.18 2.10   Cardiac Enzymes: No results for input(s): CKTOTAL, CKMB, CKMBINDEX, TROPONINI in the last 168 hours. BNP (last 3 results) No results for input(s): PROBNP in the last 8760 hours. HbA1C: No results for input(s): HGBA1C in the last 72 hours. CBG: Recent Labs  Lab 07/21/17 0739 07/21/17 1127 07/21/17 1627 07/21/17 2100 07/22/17 0737  GLUCAP 246* 251* 262* 239* 233*   Lipid Profile: No results for input(s): CHOL, HDL, LDLCALC, TRIG, CHOLHDL, LDLDIRECT in the last 72 hours. Thyroid Function Tests: Recent Labs    07/22/17 0541  TSH 0.436   Anemia Panel: No results for input(s): VITAMINB12, FOLATE, FERRITIN, TIBC, IRON, RETICCTPCT in the last 72  hours. Sepsis Labs: Recent Labs  Lab 07/19/17 1052 07/19/17 1913  LATICACIDVEN 1.22 2.77*    Recent Results (from the past 240 hour(s))  Urine culture     Status: Abnormal   Collection Time: 07/19/17  3:33 PM  Result Value Ref Range Status   Specimen Description URINE, RANDOM  Final   Special Requests NONE  Final   Culture >=100,000 COLONIES/mL STAPHYLOCOCCUS AUREUS (A)  Final   Report Status 07/21/2017 FINAL  Final   Organism ID, Bacteria STAPHYLOCOCCUS AUREUS (A)  Final      Susceptibility   Staphylococcus aureus - MIC*    CIPROFLOXACIN <=0.5 SENSITIVE Sensitive     GENTAMICIN <=0.5 SENSITIVE Sensitive     NITROFURANTOIN <=16 SENSITIVE Sensitive     OXACILLIN 0.5 SENSITIVE Sensitive     TETRACYCLINE <=1 SENSITIVE Sensitive     VANCOMYCIN 1 SENSITIVE Sensitive     TRIMETH/SULFA <=10  SENSITIVE Sensitive     CLINDAMYCIN <=0.25 SENSITIVE Sensitive     RIFAMPIN <=0.5 SENSITIVE Sensitive     Inducible Clindamycin NEGATIVE Sensitive     * >=100,000 COLONIES/mL STAPHYLOCOCCUS AUREUS  Wound or Superficial Culture     Status: None (Preliminary result)   Collection Time: 07/19/17  4:53 PM  Result Value Ref Range Status   Specimen Description EAR RIGHT  Final   Special Requests Normal  Final   Gram Stain   Final    FEW WBC PRESENT, PREDOMINANTLY PMN ABUNDANT GRAM POSITIVE COCCI MODERATE GRAM POSITIVE RODS    Culture   Final    ABUNDANT STAPHYLOCOCCUS AUREUS ABUNDANT GROUP A STREP (S.PYOGENES) ISOLATED SUSCEPTIBILITIES TO FOLLOW    Report Status PENDING  Incomplete  Culture, blood (routine x 2)     Status: None (Preliminary result)   Collection Time: 07/20/17  7:10 AM  Result Value Ref Range Status   Specimen Description BLOOD LEFT ANTECUBITAL  Final   Special Requests IN PEDIATRIC BOTTLE Blood Culture adequate volume  Final   Culture NO GROWTH 1 DAY  Final   Report Status PENDING  Incomplete  Culture, blood (routine x 2)     Status: None (Preliminary result)   Collection  Time: 07/20/17  7:40 AM  Result Value Ref Range Status   Specimen Description BLOOD LEFT ARM  Final   Special Requests IN PEDIATRIC BOTTLE Blood Culture adequate volume  Final   Culture NO GROWTH 1 DAY  Final   Report Status PENDING  Incomplete         Radiology Studies: Ct Head Wo Contrast  Result Date: 07/20/2017 CLINICAL DATA:  Initial evaluation for acute head trauma, fall at home. Headache and ataxia. Ear drainage for several days EXAM: CT HEAD WITHOUT CONTRAST TECHNIQUE: Contiguous axial images were obtained from the base of the skull through the vertex without intravenous contrast. COMPARISON:  Prior CT from 07/19/2017. FINDINGS: Brain: Stable atrophy with chronic microvascular ischemic disease. No acute intracranial hemorrhage. No acute large vessel territory infarct. No mass lesion, midline shift or mass effect. No hydrocephalus. No extra-axial fluid collection. Vascular: No hyperdense vessel. Scattered vascular calcifications noted within the carotid siphons. Skull: Small evolving right parietal scalp contusion/laceration. Skin staples in place. Calvarium intact. Sinuses/Orbits: Globes and orbital soft tissues within normal limits. Patient status post lens extraction bilaterally. Other: Moderate to severe opacity throughout the paranasal sinuses with superimposed air-fluid levels, suggesting acute sinusitis. Right mastoid air cells and middle ear cavity are largely opacified. No discernible fracture. IMPRESSION: 1. Stable appearance of the brain. No acute intracranial abnormality. 2. Small evolving right parietal scalp contusion/laceration with skin staples in place. No calvarial fracture. 3. Acute pan sinusitis. Right mastoid effusion with opacification of the right middle ear cavity. Clinical correlation for possible acute otomastoiditis recommended. Electronically Signed   By: Jeannine Boga M.D.   On: 07/20/2017 20:49   Dg Chest Port 1 View  Result Date: 07/21/2017 CLINICAL  DATA:  Shortness of breath. EXAM: PORTABLE CHEST 1 VIEW COMPARISON:  Radiographs of July 19, 2017. FINDINGS: Stable cardiomediastinal silhouette. Single lead left-sided pacemaker is unchanged in position. Mild central pulmonary vascular congestion is noted. No acute pulmonary disease is noted. No pneumothorax or pleural effusion is noted. Bony thorax is unremarkable. IMPRESSION: Stable cardiomegaly with central pulmonary vascular congestion. No significant change compared to prior exam. Electronically Signed   By: Marijo Conception, M.D.   On: 07/21/2017 12:52        Scheduled Meds: .  amLODipine  10 mg Oral QHS  . aspirin EC  81 mg Oral QHS  . atorvastatin  20 mg Oral q1800  . docusate sodium  100 mg Oral BID  . fluticasone  2 spray Each Nare Daily  . furosemide  40 mg Intravenous BID  . insulin aspart  0-9 Units Subcutaneous TID WC  . multivitamin with minerals  1 tablet Oral Daily  . neomycin-polymyxin-hydrocortisone  3 drop Right EAR Q6H  . sodium chloride flush  3 mL Intravenous Q12H  . vitamin B-12  1,000 mcg Oral Daily  . warfarin  3 mg Oral ONCE-1800  . Warfarin - Pharmacist Dosing Inpatient   Does not apply q1800   Continuous Infusions: . sodium chloride    . doxycycline (VIBRAMYCIN) IV Stopped (07/22/17 1443)  . piperacillin-tazobactam (ZOSYN)  IV 3.375 g (07/22/17 0907)     LOS: 3 days    Rockey Guarino Tanna Furry, MD [facility] Pager [phone]  If 7PM-7AM, please contact night-coverage [email or web address] Password [email or web address] 07/22/2017, 11:11 AM

## 2017-07-22 NOTE — Progress Notes (Addendum)
Physical Therapy Treatment Patient Details Name: Oscar Baker MRN: [id number] DOB: 1932/09/15 Today's Date: 07/22/2017    History of Present Illness Pt is an 84 y.o. 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 Xray shows no acute osseous injury to R femur or pelvis. CT head/neck shows no acute intracranial abnormality; small bilateral cervical lymph nodes likely related to pt's h/o chronic lymphocytic leukemia. PMH includes DM, HTN, pacemaker, sick sinus syndrome, prostate cancer, bilateral THA (left in 2017).     PT Comments    On arrival to pts room. Pt up in chair and linins soaked with bloody urine. RN made aware and arrived during session to replace condom cath. Pt required max A to don new gown. He was mod A to rise into standing and min A to ambulate 75 ft with rollator. Pt very fatigued at end of session and requested return to bed. Pt would benefit from rollator secondary to decreased activity tolerance. Equipment recommendations update.   He continues to be appropriate for SNF placement at d/c to maximize functional independence and activity tolerance. Will continue to follow acutely.   Follow Up Recommendations  SNF;Supervision for mobility/OOB     Equipment Recommendations  (rollator)    Recommendations for Other Services       Precautions / Restrictions Precautions Precautions: Fall Precaution Comments: R hip pain Restrictions Weight Bearing Restrictions: No    Mobility  Bed Mobility Overal bed mobility: Needs Assistance Bed Mobility: Sit to Supine       Sit to supine: Min assist   General bed mobility comments: Cueing for technique. Assist to elevate LEs on to bed, Pt able to scoot up in bed with multimodal cues to pull up on the bed rails and push with BIL LEs.  Transfers Overall transfer level: Needs assistance Equipment  used: (rollator) Transfers: Sit to/from Stand Sit to Stand: Mod assist         General transfer comment: mod A to stand from recliner. Cues for hand placement and technique.   Ambulation/Gait Ambulation/Gait assistance: Min assist Ambulation Distance (Feet): 75 Feet Assistive device: (rollator) Gait Pattern/deviations: Step-to pattern;Decreased weight shift to right;Antalgic;Trunk flexed Gait velocity: Decreased Gait velocity interpretation: Below normal speed for age/gender General Gait Details: Pt with slow, labored, antalgic gait. Pt taking several short standing rests during ambulation secondary to fatigue and pain.  Cues for self monitoring activity tolerance and increased WB through R LE.   Stairs            Wheelchair Mobility    Modified Rankin (Stroke Patients Only)       Balance Overall balance assessment: Needs assistance Sitting-balance support: Bilateral upper extremity supported Sitting balance-Leahy Scale: Fair   Postural control: Left lateral lean Standing balance support: Bilateral upper extremity supported Standing balance-Leahy Scale: Poor                              Cognition Arousal/Alertness: Awake/alert Behavior During Therapy: WFL for tasks assessed/performed Overall Cognitive Status: Within Functional Limits for tasks assessed                                        Exercises      General Comments General comments (skin integrity,  edema, etc.): On arrival to room pt up in recliner. Condom catheter had fallen off and linins were soaked with bloody urin. Assisted pt with gown change and removed damp linins. Pt with sacral wound. RN aware of blood in urine.      Pertinent Vitals/Pain Pain Assessment: Faces Faces Pain Scale: Hurts even more Pain Location: R hip Pain Descriptors / Indicators: Throbbing;Constant(with movement) Pain Intervention(s): Monitored during session;Limited activity within patient's  tolerance;Repositioned    Home Living                      Prior Function            PT Goals (current goals can now be found in the care plan section) Acute Rehab PT Goals Patient Stated Goal: Decreased pain PT Goal Formulation: With patient Time For Goal Achievement: 08/03/17 Potential to Achieve Goals: Good Progress towards PT goals: Progressing toward goals    Frequency    Min 2X/week      PT Plan Equipment recommendations need to be updated    Co-evaluation              AM-PAC PT "6 Clicks" Daily Activity  Outcome Measure  Difficulty turning over in bed (including adjusting bedclothes, sheets and blankets)?: Unable Difficulty moving from lying on back to sitting on the side of the bed? : Unable Difficulty sitting down on and standing up from a chair with arms (e.g., wheelchair, bedside commode, etc,.)?: Unable Help needed moving to and from a bed to chair (including a wheelchair)?: A Little Help needed walking in hospital room?: A Little Help needed climbing 3-5 steps with a railing? : A Lot 6 Click Score: 11    End of Session Equipment Utilized During Treatment: Gait belt Activity Tolerance: Patient limited by pain Patient left: with call bell/phone within reach;with family/visitor present;in bed Nurse Communication: Mobility status;Other (comment)(bloody urine) PT Visit Diagnosis: Other abnormalities of gait and mobility (R26.89);Pain Pain - Right/Left: Right Pain - part of body: Hip     Time: 5176-1607 PT Time Calculation (min) (ACUTE ONLY): 38 min  Charges:  $Gait Training: 23-37 mins $Therapeutic Activity: 8-22 mins                    G Codes:       Benjiman Core, [place] Pager [phone] Acute Rehab   Allena Katz 07/22/2017, 2:32 PM

## 2017-07-22 NOTE — Progress Notes (Signed)
Output via condom cath appears red, clear, non-odorous.  Dr. Carolin Sicks is aware.

## 2017-07-22 NOTE — Progress Notes (Signed)
Pt. Requesting sleeping pill for tonight. On call for [facility] paged to make aware.

## 2017-07-22 NOTE — Progress Notes (Signed)
[place] for warfarin Indication: atrial fibrillation  Allergies  Allergen Reactions  . Altace [Ramipril] Other (See Comments)    "throat felt like had a knot in it"  . Codeine Nausea And Vomiting    Nausea and vomiting   . Simvastatin Other (See Comments)    Leg aches    Patient Measurements: Height: 5\' 10"  (177.8 cm) Weight: 206 lb 9.1 oz (93.7 kg) IBW/kg (Calculated) : 73   Labs: Recent Labs    07/19/17 1030 07/20/17 0517 07/21/17 0439 07/22/17 0541  HGB 10.4* 10.2* 9.7*  --   HCT 31.2* 29.7* 28.2*  --   PLT 445* 437* 456*  --   LABPROT 41.6* 49.1* 32.4* 23.4*  INR 4.39* 5.42* 3.18 2.10  CREATININE 0.89 1.03 1.14 0.90    Estimated Creatinine Clearance: 72.8 mL/min (by C-G formula based on SCr of 0.9 mg/dL).  Assessment: 86 YOM on warfarin PTA for AFib. Admitted with a fall- CT negative. INR was elevated on admission and warfarin was held until last evening. A smaller dose was given yesterday in order to prevent INR from dropping to <2. INR today 2.1.  Home dose of warfarin is 5mg  daily.  Patient is also currently on doxycycline which can potentiate INR. Will follow for change to antibiotics and adjust warfarin as needed.   Goal of Therapy:  INR 2-2.5 per protocol Monitor platelets by anticoagulation protocol: Yes   Plan:  Warfarin 3mg  po x1 tonight Daily INR Follow for s/s bleeding  Hawkins Seaman D. Llewelyn Sheaffer, PharmD, BCPS Clinical Pharmacist Clinical Phone for 07/22/2017 until 3:30pm: [phone] If after 3:30pm, please call main pharmacy at [phone] 07/22/2017 10:12 AM

## 2017-07-23 DIAGNOSIS — E785 Hyperlipidemia, unspecified: Secondary | ICD-10-CM | POA: Diagnosis not present

## 2017-07-23 DIAGNOSIS — J0140 Acute pansinusitis, unspecified: Secondary | ICD-10-CM | POA: Diagnosis not present

## 2017-07-23 DIAGNOSIS — I480 Paroxysmal atrial fibrillation: Secondary | ICD-10-CM | POA: Diagnosis not present

## 2017-07-23 DIAGNOSIS — R279 Unspecified lack of coordination: Secondary | ICD-10-CM | POA: Diagnosis not present

## 2017-07-23 DIAGNOSIS — J3089 Other allergic rhinitis: Secondary | ICD-10-CM | POA: Diagnosis not present

## 2017-07-23 DIAGNOSIS — E119 Type 2 diabetes mellitus without complications: Secondary | ICD-10-CM | POA: Diagnosis not present

## 2017-07-23 DIAGNOSIS — W19XXXA Unspecified fall, initial encounter: Secondary | ICD-10-CM | POA: Diagnosis not present

## 2017-07-23 DIAGNOSIS — R791 Abnormal coagulation profile: Secondary | ICD-10-CM | POA: Diagnosis not present

## 2017-07-23 DIAGNOSIS — Z96643 Presence of artificial hip joint, bilateral: Secondary | ICD-10-CM | POA: Diagnosis not present

## 2017-07-23 DIAGNOSIS — Z66 Do not resuscitate: Secondary | ICD-10-CM | POA: Diagnosis not present

## 2017-07-23 DIAGNOSIS — C9110 Chronic lymphocytic leukemia of B-cell type not having achieved remission: Secondary | ICD-10-CM | POA: Diagnosis not present

## 2017-07-23 DIAGNOSIS — H6691 Otitis media, unspecified, right ear: Secondary | ICD-10-CM | POA: Diagnosis not present

## 2017-07-23 DIAGNOSIS — Z8546 Personal history of malignant neoplasm of prostate: Secondary | ICD-10-CM | POA: Diagnosis not present

## 2017-07-23 DIAGNOSIS — K5900 Constipation, unspecified: Secondary | ICD-10-CM | POA: Diagnosis not present

## 2017-07-23 DIAGNOSIS — M25551 Pain in right hip: Secondary | ICD-10-CM | POA: Diagnosis not present

## 2017-07-23 DIAGNOSIS — Z23 Encounter for immunization: Secondary | ICD-10-CM | POA: Diagnosis not present

## 2017-07-23 DIAGNOSIS — H6091 Unspecified otitis externa, right ear: Secondary | ICD-10-CM | POA: Diagnosis not present

## 2017-07-23 DIAGNOSIS — Z888 Allergy status to other drugs, medicaments and biological substances status: Secondary | ICD-10-CM | POA: Diagnosis not present

## 2017-07-23 DIAGNOSIS — F5109 Other insomnia not due to a substance or known physiological condition: Secondary | ICD-10-CM | POA: Diagnosis not present

## 2017-07-23 DIAGNOSIS — I495 Sick sinus syndrome: Secondary | ICD-10-CM | POA: Diagnosis not present

## 2017-07-23 DIAGNOSIS — Z950 Presence of cardiac pacemaker: Secondary | ICD-10-CM | POA: Diagnosis not present

## 2017-07-23 DIAGNOSIS — Z79899 Other long term (current) drug therapy: Secondary | ICD-10-CM | POA: Diagnosis not present

## 2017-07-23 DIAGNOSIS — I5032 Chronic diastolic (congestive) heart failure: Secondary | ICD-10-CM | POA: Diagnosis not present

## 2017-07-23 DIAGNOSIS — N401 Enlarged prostate with lower urinary tract symptoms: Secondary | ICD-10-CM | POA: Diagnosis not present

## 2017-07-23 DIAGNOSIS — E871 Hypo-osmolality and hyponatremia: Secondary | ICD-10-CM | POA: Diagnosis not present

## 2017-07-23 DIAGNOSIS — H6690 Otitis media, unspecified, unspecified ear: Secondary | ICD-10-CM | POA: Diagnosis not present

## 2017-07-23 DIAGNOSIS — I110 Hypertensive heart disease with heart failure: Secondary | ICD-10-CM | POA: Diagnosis not present

## 2017-07-23 DIAGNOSIS — I4891 Unspecified atrial fibrillation: Secondary | ICD-10-CM | POA: Diagnosis not present

## 2017-07-23 DIAGNOSIS — I5040 Unspecified combined systolic (congestive) and diastolic (congestive) heart failure: Secondary | ICD-10-CM | POA: Diagnosis not present

## 2017-07-23 DIAGNOSIS — H66017 Acute suppurative otitis media with spontaneous rupture of ear drum, recurrent, unspecified ear: Secondary | ICD-10-CM | POA: Diagnosis not present

## 2017-07-23 DIAGNOSIS — B356 Tinea cruris: Secondary | ICD-10-CM | POA: Diagnosis not present

## 2017-07-23 DIAGNOSIS — I5033 Acute on chronic diastolic (congestive) heart failure: Secondary | ICD-10-CM | POA: Diagnosis not present

## 2017-07-23 DIAGNOSIS — H6061 Unspecified chronic otitis externa, right ear: Secondary | ICD-10-CM | POA: Diagnosis not present

## 2017-07-23 DIAGNOSIS — Z7901 Long term (current) use of anticoagulants: Secondary | ICD-10-CM | POA: Diagnosis not present

## 2017-07-23 DIAGNOSIS — R0982 Postnasal drip: Secondary | ICD-10-CM | POA: Diagnosis not present

## 2017-07-23 DIAGNOSIS — Z9181 History of falling: Secondary | ICD-10-CM | POA: Diagnosis not present

## 2017-07-23 DIAGNOSIS — Z885 Allergy status to narcotic agent status: Secondary | ICD-10-CM | POA: Diagnosis not present

## 2017-07-23 DIAGNOSIS — W1830XA Fall on same level, unspecified, initial encounter: Secondary | ICD-10-CM | POA: Diagnosis not present

## 2017-07-23 DIAGNOSIS — J80 Acute respiratory distress syndrome: Secondary | ICD-10-CM | POA: Diagnosis not present

## 2017-07-23 DIAGNOSIS — M6281 Muscle weakness (generalized): Secondary | ICD-10-CM | POA: Diagnosis not present

## 2017-07-23 DIAGNOSIS — E569 Vitamin deficiency, unspecified: Secondary | ICD-10-CM | POA: Diagnosis not present

## 2017-07-23 DIAGNOSIS — I5043 Acute on chronic combined systolic (congestive) and diastolic (congestive) heart failure: Secondary | ICD-10-CM | POA: Diagnosis not present

## 2017-07-23 DIAGNOSIS — I5022 Chronic systolic (congestive) heart failure: Secondary | ICD-10-CM | POA: Diagnosis not present

## 2017-07-23 DIAGNOSIS — Z856 Personal history of leukemia: Secondary | ICD-10-CM | POA: Diagnosis not present

## 2017-07-23 DIAGNOSIS — C9112 Chronic lymphocytic leukemia of B-cell type in relapse: Secondary | ICD-10-CM | POA: Diagnosis not present

## 2017-07-23 DIAGNOSIS — G8929 Other chronic pain: Secondary | ICD-10-CM | POA: Diagnosis not present

## 2017-07-23 DIAGNOSIS — R2689 Other abnormalities of gait and mobility: Secondary | ICD-10-CM | POA: Diagnosis not present

## 2017-07-23 DIAGNOSIS — H7291 Unspecified perforation of tympanic membrane, right ear: Secondary | ICD-10-CM | POA: Diagnosis not present

## 2017-07-23 DIAGNOSIS — Z111 Encounter for screening for respiratory tuberculosis: Secondary | ICD-10-CM | POA: Diagnosis not present

## 2017-07-23 DIAGNOSIS — Y92009 Unspecified place in unspecified non-institutional (private) residence as the place of occurrence of the external cause: Secondary | ICD-10-CM | POA: Diagnosis not present

## 2017-07-23 DIAGNOSIS — R339 Retention of urine, unspecified: Secondary | ICD-10-CM | POA: Diagnosis not present

## 2017-07-23 DIAGNOSIS — I10 Essential (primary) hypertension: Secondary | ICD-10-CM | POA: Diagnosis not present

## 2017-07-23 DIAGNOSIS — S0101XD Laceration without foreign body of scalp, subsequent encounter: Secondary | ICD-10-CM | POA: Diagnosis not present

## 2017-07-23 LAB — RENAL FUNCTION PANEL
Albumin: 2.3 g/dL — ABNORMAL LOW (ref 3.5–5.0)
Anion gap: 11 (ref 5–15)
BUN: 23 mg/dL — AB (ref 6–20)
CO2: 26 mmol/L (ref 22–32)
CREATININE: 1.1 mg/dL (ref 0.61–1.24)
Calcium: 8 mg/dL — ABNORMAL LOW (ref 8.9–10.3)
Chloride: 91 mmol/L — ABNORMAL LOW (ref 101–111)
GFR calc non Af Amer: >60 mL/min (ref >60)
GLUCOSE: 354 mg/dL — AB (ref 65–99)
Phosphorus: 2.6 mg/dL (ref 2.5–4.6)
Potassium: 4.3 mmol/L (ref 3.5–5.1)
SODIUM: 128 mmol/L — AB (ref 135–145)

## 2017-07-23 LAB — GLUCOSE, CAPILLARY
Glucose-Capillary: 390 mg/dL — ABNORMAL HIGH (ref 65–99)
Glucose-Capillary: 400 mg/dL — ABNORMAL HIGH (ref 65–99)

## 2017-07-23 LAB — PROTIME-INR
INR: 2
Prothrombin Time: 22.5 s — ABNORMAL HIGH (ref 11.4–15.2)

## 2017-07-23 LAB — CBC
HCT: 26.6 % — ABNORMAL LOW (ref 39.0–52.0)
Hemoglobin: 9 g/dL — ABNORMAL LOW (ref 13.0–17.0)
MCH: 32 pg (ref 26.0–34.0)
MCHC: 33.8 g/dL (ref 30.0–36.0)
MCV: 94.7 fL (ref 78.0–100.0)
PLATELETS: 511 10*3/uL — AB (ref 150–400)
RBC: 2.81 MIL/uL — AB (ref 4.22–5.81)
RDW: 18.1 % — ABNORMAL HIGH (ref 11.5–15.5)
WBC: 61 10*3/uL (ref 4.0–10.5)

## 2017-07-23 MED ORDER — GUAIFENESIN-DM 100-10 MG/5ML PO SYRP: 5.0000 mL | ORAL_SOLUTION | ORAL | 0 refills | Status: DC | PRN

## 2017-07-23 MED ORDER — CEPHALEXIN 500 MG PO CAPS: 500.0000 mg | ORAL_CAPSULE | Freq: Two times a day (BID) | ORAL | 0 refills | Status: AC

## 2017-07-23 MED ORDER — INSULIN ASPART 100 UNIT/ML ~~LOC~~ SOLN: 0.0000 [IU] | Freq: Three times a day (TID) | SUBCUTANEOUS | 0 refills | Status: DC

## 2017-07-23 MED ORDER — POTASSIUM CHLORIDE ER 10 MEQ PO TBCR: 10.0000 meq | EXTENDED_RELEASE_TABLET | Freq: Every day | ORAL | 0 refills | Status: DC

## 2017-07-23 MED ORDER — DOXYCYCLINE HYCLATE 50 MG PO CAPS: 100.0000 mg | ORAL_CAPSULE | Freq: Two times a day (BID) | ORAL | 0 refills | Status: AC

## 2017-07-23 MED ORDER — METHYLPREDNISOLONE 4 MG PO TBPK: ORAL_TABLET | ORAL | 0 refills | Status: DC

## 2017-07-23 MED ORDER — TRAMADOL HCL 50 MG PO TABS: 50.0000 mg | ORAL_TABLET | Freq: Four times a day (QID) | ORAL | 0 refills | Status: DC | PRN

## 2017-07-23 MED ORDER — OXYMETAZOLINE HCL 0.05 % NA SOLN: 1.0000 | Freq: Once | NASAL | 0 refills | Status: DC | PRN

## 2017-07-23 MED ORDER — WARFARIN SODIUM 3 MG PO TABS: 3.0000 mg | ORAL_TABLET | Freq: Once | ORAL | Status: DC

## 2017-07-23 NOTE — Progress Notes (Signed)
Attempted to call report x 1  

## 2017-07-23 NOTE — Care Management Important Message (Signed)
Important Message  Patient Details  Name: Oscar Baker MRN: [id number] Date of Birth: 10/30/1932   Medicare Important Message Given:  Yes    Andres Bantz P [place] 07/23/2017, 2:28 PM

## 2017-07-23 NOTE — Progress Notes (Signed)
[name] for warfarin Indication: atrial fibrillation  Allergies  Allergen Reactions  . Altace [Ramipril] Other (See Comments)    "throat felt like had a knot in it"  . Codeine Nausea And Vomiting    Nausea and vomiting   . Simvastatin Other (See Comments)    Leg aches    Patient Measurements: Height: 5\' 10"  (177.8 cm) Weight: 202 lb 13.2 oz (92 kg)(bed) IBW/kg (Calculated) : 73   Labs: Recent Labs    07/21/17 0439 07/22/17 0541 07/23/17 0503  HGB 9.7*  --  9.0*  HCT 28.2*  --  26.6*  PLT 456*  --  511*  LABPROT 32.4* 23.4* 22.5*  INR 3.18 2.10 2.00  CREATININE 1.14 0.90 1.10    Estimated Creatinine Clearance: 59 mL/min (by C-G formula based on SCr of 1.1 mg/dL).  Assessment: 30 YOM on warfarin PTA for AFib. Admitted with a fall- CT negative. INR was elevated on admission and warfarin was held until 1/30. Home dose of warfarin is 5mg  daily.  INR today 2. Hgb has slowly been trending down, plts remain elevated. Noted dark colored/red urine mentioned in MD and RN notes.  Patient is also currently on doxycycline which can potentiate INR. Will follow for change to antibiotics and adjust warfarin as needed.  Goal of Therapy:  INR 2-2.5 per protocol Monitor platelets by anticoagulation protocol: Yes   Plan:  Warfarin 3mg  po x1 tonight Daily INR Follow for s/s bleeding  *Recommend resuming patient's home DM medications or increasing SSI in light of elevated CBGs and systemic steroids starting today *Recommend de-escalating to Augmentin to treat otitis media as well as UTI  Oscar Baker D. Yasmin Bronaugh, PharmD, BCPS Clinical Pharmacist Clinical Phone for 07/23/2017 until 3:30pm: [phone] If after 3:30pm, please call main pharmacy at [phone] 07/23/2017 8:13 AM

## 2017-07-23 NOTE — Clinical Social Work Note (Signed)
CSW facilitated patient discharge including contacting patient family and facility to confirm patient discharge plans. Clinical information faxed to facility and family agreeable with plan. CSW arranged ambulance transport via [facility] to [facility] at 1:00 pm. RN to call report prior to discharge ([phone] Room [id number]).  CSW will sign off for now as social work intervention is no longer needed. Please consult [place] again if new needs arise.  Dayton Scrape, [place]

## 2017-07-23 NOTE — Plan of Care (Signed)
Pt. Able to able to participate in ADLs.

## 2017-07-23 NOTE — Discharge Summary (Addendum)
Physician Discharge Summary  Oscar Baker DJS:[id number] DOB: [birth year] DOA: 07/19/2017  PCP: [name], MD  Admit date: 07/19/2017 Discharge date: 07/23/2017  Admitted From:home Disposition:SNF  Recommendations for Outpatient Follow-up:  1. Follow up with PCP in 1-2 weeks 2. Please obtain BMP/CBC in one week  Home Health:SNF Equipment/Devices:none Discharge Condition:stable CODE STATUS:DNR Diet recommendation: Carbohydrate modified heart healthy diet.  Brief/Interim Summary: 84 y.o.malewith medical history significant ofatrial fibrillation on warfarin, CLL not on any treatment, sick sinus syndrome status post pacer, prostate cancer status post prostatectomy, type 2 diabetes on oral hypoglycemics, bilateral hip replacements who comes in after mechanical fall at home, ear drainage for several days, and sinus congestion and shortness of breath.  #Fall: CT head was negative for acute finding.  PT recommended skilled nursing home.  Patient is going to skilled nursing home for rehab.  #Paroxysmal atrial fibrillation with supratherapeutic INR on admission:  -INR 2.0 today.    Urine is clear.  Continue Coumadin and monitor INR.  #Sick sinus syndrome status post pacemaker:   #Chronic systolic CHF with mild exacerbation: Patient with elevated BNP of 727.  Chest x-ray with stable cardiomegaly and pulmonary vascular congestion.   Echo showed mildly reduced systolic function with EF of 45-50%.   -Treated with IV Lasix with improvement.  Continue oral Lasix twice a day on discharge.  Recommended low-salt diet, daily weight and follow-up with cardiologist.  #Hypophosphatemia: Improved.  Encourage oral intake.  #Otitis media with a spontaneous rupture of drum: ENT evaluated the patient.  The culture growing a Streptococcus and Staphylococcus.  Treated with doxycycline and Zosyn in the hospital.  Plan to discharge with oral doxycycline and Keflex for 3 more days.  Clinically improved.   Recommend to follow-up with ENT within a week for further evaluation.  Discussed with the patient and family at bedside.  #Hypervolemic hyponatremia in the setting of CHF: Serum sodium level 128.  Continue oral Lasix on discharge.  Recommended to check lab to monitor BMP and electrolytes.  Clinically improved.  #CLL: Has leukocytosis.  Recommended outpatient follow-up with oncology.  #Type 2 diabetes: Continue home medication.  #Hypertension: On Norvasc, diuretics  #Chronic right hip pain: Patient had a bilateral hip repair in the past.  X-ray with no acute finding.  Evaluated by orthopedics.  Also started on Medrol pack and outpatient follow-up.  I recommend to monitor blood sugar level while on a steroid.  Patient will continue therapy and follow-up with Dr. Alvan Dame outpatient.  Pain medication ordered.  Patient is clinically stable to transfer his care to outpatient.  Recommended to follow-up with ENT, cardiology, PCP and orthopedics.  Patient also follow-up with urologist after evaluation done by cardiologist.  I discussed this with the patient, his wife and brother at bedside.  Discussed with the social worker regarding discharge plan.   Discharge Diagnoses:  Active Problems:   Fall at home, initial encounter   Acute on chronic mild systolic CHF     Right hip pain   Acute otitis media    Discharge Instructions  Discharge Instructions    (HEART FAILURE PATIENTS) Call MD:  Anytime you have any of the following symptoms: 1) 3 pound weight gain in 24 hours or 5 pounds in 1 week 2) shortness of breath, with or without a dry hacking cough 3) swelling in the hands, feet or stomach 4) if you have to sleep on extra pillows at night in order to breathe.   Complete by:  As directed  Call MD for:  difficulty breathing, headache or visual disturbances   Complete by:  As directed    Call MD for:  extreme fatigue   Complete by:  As directed    Call MD for:  hives   Complete by:  As directed     Call MD for:  persistant dizziness or light-headedness   Complete by:  As directed    Call MD for:  persistant nausea and vomiting   Complete by:  As directed    Call MD for:  severe uncontrolled pain   Complete by:  As directed    Call MD for:  temperature >100.4   Complete by:  As directed    Diet - low sodium heart healthy   Complete by:  As directed    Diet Carb Modified   Complete by:  As directed    Increase activity slowly   Complete by:  As directed      Allergies as of 07/23/2017      Reactions   Altace [ramipril] Other (See Comments)   "throat felt like had a knot in it"   Codeine Nausea And Vomiting   Nausea and vomiting   Simvastatin Other (See Comments)   Leg aches      Medication List    TAKE these medications   acetaminophen 500 MG tablet Commonly known as:  TYLENOL Take 1,000 mg by mouth as needed for mild pain.   amLODipine 10 MG tablet Commonly known as:  NORVASC Take 10 mg by mouth at bedtime.   aspirin 81 MG tablet Take 81 mg by mouth at bedtime. Will stop prior to surgery   atorvastatin 20 MG tablet Commonly known as:  LIPITOR TAKE 1 TABLET BY MOUTH DAILY AT 6PM   cephALEXin 500 MG capsule Commonly known as:  KEFLEX Take 1 capsule (500 mg total) by mouth 2 (two) times daily for 3 days.   docusate sodium 100 MG capsule Commonly known as:  COLACE Take 1 capsule (100 mg total) by mouth 2 (two) times daily.   doxycycline 50 MG capsule Commonly known as:  VIBRAMYCIN Take 2 capsules (100 mg total) by mouth 2 (two) times daily for 3 days.   fenofibrate micronized 200 MG capsule Commonly known as:  LOFIBRA TAKE 1 CAPSULE BY MOUTH DAILY BEFORE BREAKFAST   fluticasone 50 MCG/ACT nasal spray Commonly known as:  FLONASE Place 1 spray into both nostrils as needed for allergies or rhinitis.   glipiZIDE 2.5 MG 24 hr tablet Commonly known as:  GLUCOTROL XL Take 2.5 mg by mouth daily with breakfast.   guaiFENesin-dextromethorphan 100-10 MG/5ML  syrup Commonly known as:  ROBITUSSIN DM Take 5 mLs by mouth every 4 (four) hours as needed for cough.   insulin aspart 100 UNIT/ML injection Commonly known as:  novoLOG Inject 0-9 Units into the skin 3 (three) times daily with meals.   JANUVIA 100 MG tablet Generic drug:  sitaGLIPtin Take 100 mg by mouth at bedtime.   metFORMIN 1000 MG tablet Commonly known as:  GLUCOPHAGE Take 1,000 mg by mouth 2 (two) times daily with a meal.   methylPREDNISolone 4 MG Tbpk tablet Commonly known as:  MEDROL DOSEPAK As instructed   multivitamin per tablet Take 1 tablet by mouth daily. Will stop prior to procedure   oxymetazoline 0.05 % nasal spray Commonly known as:  AFRIN Place 1 spray into both nostrils once as needed for congestion (Bedside procedure).   potassium chloride 10 MEQ tablet Commonly known as:  K-DUR Take 1 tablet (10 mEq total) by mouth daily.   traMADol 50 MG tablet Commonly known as:  ULTRAM Take 1 tablet (50 mg total) by mouth every 6 (six) hours as needed for severe pain.   vitamin B-12 1000 MCG tablet Commonly known as:  CYANOCOBALAMIN Take 1,000 mcg by mouth daily. Will stop prior to procedure   warfarin 5 MG tablet Commonly known as:  COUMADIN Take as directed. If you are unsure how to take this medication, talk to your nurse or doctor. Original instructions:  TAKE AS DIRECTED BY COUMADIN CLINIC       Contact information for follow-up providers    [name], MD. Schedule an appointment as soon as possible for a visit in 1 week(s).   Specialty:  Internal Medicine Contact information: [name] [address] [phone]        Jodi Marble, MD. Schedule an appointment as soon as possible for a visit in 1 week(s).   Specialty:  Otolaryngology Contact information: [address] [place] [address] [phone]        Paralee Cancel, MD. Schedule an appointment as soon as possible for a visit in 2 week(s).    Specialty:  Orthopedic Surgery Contact information: [address] [place] [address] [phone]        Nahser, Wonda Cheng, MD. Schedule an appointment as soon as possible for a visit in 2 week(s).   Specialty:  Cardiology Contact information: [place] [address] [phone]            Contact information for after-discharge care    Destination    [facility] Follow up.   Service:  Skilled Nursing Contact information: [address] [place] [phone]                 Allergies  Allergen Reactions  . Altace [Ramipril] Other (See Comments)    "throat felt like had a knot in it"  . Codeine Nausea And Vomiting    Nausea and vomiting   . Simvastatin Other (See Comments)    Leg aches    Consultations: Orthopedics ENT  Procedures/Studies: None  Subjective: Seen and examined at bedside.  Reported feeling better.  Denied headache, dizziness, nausea, vomiting, chest pain, shortness of breath.  Right hip pain is stable.  Discharge Exam: Vitals:   07/22/17 2100 07/23/17 0557  BP: (!) 107/52 (!) 111/51  Pulse: (!) 58 65  Resp: 20   Temp: 98.9 F (37.2 C) (!) 97.4 F (36.3 C)  SpO2: 92%    Vitals:   07/22/17 0427 07/22/17 1400 07/22/17 2100 07/23/17 0557  BP: (!) 123/55 119/60 (!) 107/52 (!) 111/51  Pulse: (!) 58 72 (!) 58 65  Resp: 18  20   Temp: 99.2 F (37.3 C) 98.4 F (36.9 C) 98.9 F (37.2 C) (!) 97.4 F (36.3 C)  TempSrc: Oral Oral Oral Oral  SpO2: 95% 99% 92%   Weight: 93.7 kg (206 lb 9.1 oz)   92 kg (202 lb 13.2 oz)  Height:        General: Pt is alert, awake, not in acute distress Cardiovascular: RRR, S1/S2 +, no rubs, no gallops Respiratory: CTA bilaterally, no wheezing, no rhonchi Abdominal: Soft, NT, ND, bowel sounds + Extremities: no edema, no cyanosis    The results of significant diagnostics from this hospitalization (including imaging,  microbiology, ancillary and laboratory) are listed below for reference.  Microbiology: Recent Results (from the past 240 hour(s))  Urine culture     Status: Abnormal   Collection Time: 07/19/17  3:33 PM  Result Value Ref Range Status   Specimen Description URINE, RANDOM  Final   Special Requests NONE  Final   Culture >=100,000 COLONIES/mL STAPHYLOCOCCUS AUREUS (A)  Final   Report Status 07/21/2017 FINAL  Final   Organism ID, Bacteria STAPHYLOCOCCUS AUREUS (A)  Final      Susceptibility   Staphylococcus aureus - MIC*    CIPROFLOXACIN <=0.5 SENSITIVE Sensitive     GENTAMICIN <=0.5 SENSITIVE Sensitive     NITROFURANTOIN <=16 SENSITIVE Sensitive     OXACILLIN 0.5 SENSITIVE Sensitive     TETRACYCLINE <=1 SENSITIVE Sensitive     VANCOMYCIN 1 SENSITIVE Sensitive     TRIMETH/SULFA <=10 SENSITIVE Sensitive     CLINDAMYCIN <=0.25 SENSITIVE Sensitive     RIFAMPIN <=0.5 SENSITIVE Sensitive     Inducible Clindamycin NEGATIVE Sensitive     * >=100,000 COLONIES/mL STAPHYLOCOCCUS AUREUS  Wound or Superficial Culture     Status: None   Collection Time: 07/19/17  4:53 PM  Result Value Ref Range Status   Specimen Description EAR RIGHT  Final   Special Requests Normal  Final   Gram Stain   Final    FEW WBC PRESENT, PREDOMINANTLY PMN ABUNDANT GRAM POSITIVE COCCI MODERATE GRAM POSITIVE RODS    Culture   Final    ABUNDANT STAPHYLOCOCCUS AUREUS ABUNDANT GROUP A STREP (S.PYOGENES) ISOLATED    Report Status 07/22/2017 FINAL  Final   Organism ID, Bacteria STAPHYLOCOCCUS AUREUS  Final   Organism ID, Bacteria GROUP A STREP (S.PYOGENES) ISOLATED  Final      Susceptibility   Group a strep (s.pyogenes) isolated - MIC*    PENICILLIN <=0.06 SENSITIVE Sensitive     CEFTRIAXONE <=0.12 SENSITIVE Sensitive     ERYTHROMYCIN <=0.12 SENSITIVE Sensitive     LEVOFLOXACIN 0.5 SENSITIVE Sensitive     VANCOMYCIN <=0.12 SENSITIVE Sensitive     MOXIFLOXACIN 0.12 SENSITIVE Sensitive     TIGECYCLINE <=0.06  SENSITIVE Sensitive     * ABUNDANT GROUP A STREP (S.PYOGENES) ISOLATED   Staphylococcus aureus - MIC*    CIPROFLOXACIN <=0.5 SENSITIVE Sensitive     ERYTHROMYCIN <=0.25 SENSITIVE Sensitive     GENTAMICIN <=0.5 SENSITIVE Sensitive     OXACILLIN 0.5 SENSITIVE Sensitive     TETRACYCLINE <=1 SENSITIVE Sensitive     VANCOMYCIN 1 SENSITIVE Sensitive     TRIMETH/SULFA <=10 SENSITIVE Sensitive     CLINDAMYCIN <=0.25 SENSITIVE Sensitive     RIFAMPIN <=0.5 SENSITIVE Sensitive     Inducible Clindamycin NEGATIVE Sensitive     * ABUNDANT STAPHYLOCOCCUS AUREUS  Culture, blood (routine x 2)     Status: None (Preliminary result)   Collection Time: 07/20/17  7:10 AM  Result Value Ref Range Status   Specimen Description BLOOD LEFT ANTECUBITAL  Final   Special Requests IN PEDIATRIC BOTTLE Blood Culture adequate volume  Final   Culture NO GROWTH 2 DAYS  Final   Report Status PENDING  Incomplete  Culture, blood (routine x 2)     Status: None (Preliminary result)   Collection Time: 07/20/17  7:40 AM  Result Value Ref Range Status   Specimen Description BLOOD LEFT ARM  Final   Special Requests IN PEDIATRIC BOTTLE Blood Culture adequate volume  Final   Culture NO GROWTH 2 DAYS  Final   Report Status PENDING  Incomplete     Labs: BNP (last  3 results) Recent Labs    07/19/17 1030  BNP 629.4*   Basic Metabolic Panel: Recent Labs  Lab 07/19/17 1030 07/20/17 0517 07/21/17 0439 07/22/17 0541 07/23/17 0503  NA 130* 128* 124* 126* 128*  K 4.4 4.1 3.8 4.0 4.3  CL 100* 98* 92* 90* 91*  CO2 18* 19* 21* 22 26  GLUCOSE 294* 247* 232* 223* 354*  BUN 13 19 23* 22* 23*  CREATININE 0.89 1.03 1.14 0.90 1.10  CALCIUM 8.2* 7.8* 7.5* 7.8* 8.0*  MG  --  1.6* 1.9 1.8  --   PHOS  --   --   --  1.7* 2.6   Liver Function Tests: Recent Labs  Lab 07/19/17 1030 07/22/17 0541 07/23/17 0503  AST 26  --   --   ALT 19  --   --   ALKPHOS 71  --   --   BILITOT 1.0  --   --   PROT 5.5*  --   --   ALBUMIN 2.4*  1.8* 2.3*   No results for input(s): LIPASE, AMYLASE in the last 168 hours. No results for input(s): AMMONIA in the last 168 hours. CBC: Recent Labs  Lab 07/19/17 1030 07/20/17 0517 07/21/17 0439 07/23/17 0503  WBC 56.1* 68.9* 70.1* 61.0*  NEUTROABS 22.4*  --   --   --   HGB 10.4* 10.2* 9.7* 9.0*  HCT 31.2* 29.7* 28.2* 26.6*  MCV 96.3 95.5 94.9 94.7  PLT 445* 437* 456* 511*   Cardiac Enzymes: No results for input(s): CKTOTAL, CKMB, CKMBINDEX, TROPONINI in the last 168 hours. BNP: Invalid input(s): POCBNP CBG: Recent Labs  Lab 07/22/17 1158 07/22/17 1639 07/22/17 2055 07/23/17 0738 07/23/17 1141  GLUCAP 280* 319* 284* 390* 400*   D-Dimer No results for input(s): DDIMER in the last 72 hours. Hgb A1c No results for input(s): HGBA1C in the last 72 hours. Lipid Profile No results for input(s): CHOL, HDL, LDLCALC, TRIG, CHOLHDL, LDLDIRECT in the last 72 hours. Thyroid function studies Recent Labs    07/22/17 0541  TSH 0.436   Anemia work up No results for input(s): VITAMINB12, FOLATE, FERRITIN, TIBC, IRON, RETICCTPCT in the last 72 hours. Urinalysis    Component Value Date/Time   COLORURINE YELLOW 07/19/2017 1545   APPEARANCEUR HAZY (A) 07/19/2017 1545   LABSPEC 1.044 (H) 07/19/2017 1545   PHURINE 6.0 07/19/2017 1545   GLUCOSEU >=500 (A) 07/19/2017 1545   HGBUR LARGE (A) 07/19/2017 1545   BILIRUBINUR NEGATIVE 07/19/2017 1545   KETONESUR 20 (A) 07/19/2017 1545   PROTEINUR 30 (A) 07/19/2017 1545   NITRITE POSITIVE (A) 07/19/2017 1545   LEUKOCYTESUR NEGATIVE 07/19/2017 1545   Sepsis Labs Invalid input(s): PROCALCITONIN,  WBC,  LACTICIDVEN Microbiology Recent Results (from the past 240 hour(s))  Urine culture     Status: Abnormal   Collection Time: 07/19/17  3:33 PM  Result Value Ref Range Status   Specimen Description URINE, RANDOM  Final   Special Requests NONE  Final   Culture >=100,000 COLONIES/mL STAPHYLOCOCCUS AUREUS (A)  Final   Report Status  07/21/2017 FINAL  Final   Organism ID, Bacteria STAPHYLOCOCCUS AUREUS (A)  Final      Susceptibility   Staphylococcus aureus - MIC*    CIPROFLOXACIN <=0.5 SENSITIVE Sensitive     GENTAMICIN <=0.5 SENSITIVE Sensitive     NITROFURANTOIN <=16 SENSITIVE Sensitive     OXACILLIN 0.5 SENSITIVE Sensitive     TETRACYCLINE <=1 SENSITIVE Sensitive     VANCOMYCIN 1 SENSITIVE Sensitive  TRIMETH/SULFA <=10 SENSITIVE Sensitive     CLINDAMYCIN <=0.25 SENSITIVE Sensitive     RIFAMPIN <=0.5 SENSITIVE Sensitive     Inducible Clindamycin NEGATIVE Sensitive     * >=100,000 COLONIES/mL STAPHYLOCOCCUS AUREUS  Wound or Superficial Culture     Status: None   Collection Time: 07/19/17  4:53 PM  Result Value Ref Range Status   Specimen Description EAR RIGHT  Final   Special Requests Normal  Final   Gram Stain   Final    FEW WBC PRESENT, PREDOMINANTLY PMN ABUNDANT GRAM POSITIVE COCCI MODERATE GRAM POSITIVE RODS    Culture   Final    ABUNDANT STAPHYLOCOCCUS AUREUS ABUNDANT GROUP A STREP (S.PYOGENES) ISOLATED    Report Status 07/22/2017 FINAL  Final   Organism ID, Bacteria STAPHYLOCOCCUS AUREUS  Final   Organism ID, Bacteria GROUP A STREP (S.PYOGENES) ISOLATED  Final      Susceptibility   Group a strep (s.pyogenes) isolated - MIC*    PENICILLIN <=0.06 SENSITIVE Sensitive     CEFTRIAXONE <=0.12 SENSITIVE Sensitive     ERYTHROMYCIN <=0.12 SENSITIVE Sensitive     LEVOFLOXACIN 0.5 SENSITIVE Sensitive     VANCOMYCIN <=0.12 SENSITIVE Sensitive     MOXIFLOXACIN 0.12 SENSITIVE Sensitive     TIGECYCLINE <=0.06 SENSITIVE Sensitive     * ABUNDANT GROUP A STREP (S.PYOGENES) ISOLATED   Staphylococcus aureus - MIC*    CIPROFLOXACIN <=0.5 SENSITIVE Sensitive     ERYTHROMYCIN <=0.25 SENSITIVE Sensitive     GENTAMICIN <=0.5 SENSITIVE Sensitive     OXACILLIN 0.5 SENSITIVE Sensitive     TETRACYCLINE <=1 SENSITIVE Sensitive     VANCOMYCIN 1 SENSITIVE Sensitive     TRIMETH/SULFA <=10 SENSITIVE Sensitive      CLINDAMYCIN <=0.25 SENSITIVE Sensitive     RIFAMPIN <=0.5 SENSITIVE Sensitive     Inducible Clindamycin NEGATIVE Sensitive     * ABUNDANT STAPHYLOCOCCUS AUREUS  Culture, blood (routine x 2)     Status: None (Preliminary result)   Collection Time: 07/20/17  7:10 AM  Result Value Ref Range Status   Specimen Description BLOOD LEFT ANTECUBITAL  Final   Special Requests IN PEDIATRIC BOTTLE Blood Culture adequate volume  Final   Culture NO GROWTH 2 DAYS  Final   Report Status PENDING  Incomplete  Culture, blood (routine x 2)     Status: None (Preliminary result)   Collection Time: 07/20/17  7:40 AM  Result Value Ref Range Status   Specimen Description BLOOD LEFT ARM  Final   Special Requests IN PEDIATRIC BOTTLE Blood Culture adequate volume  Final   Culture NO GROWTH 2 DAYS  Final   Report Status PENDING  Incomplete     Time coordinating discharge: 33 minutes  SIGNED:   Rosita Fire, MD  [facility] 07/23/2017, 12:10 PM  If 7PM-7AM, please contact night-coverage [email or web address] Password [email or web address]

## 2017-07-23 NOTE — Consult Note (Signed)
   [facility] Inpatient Consult   07/23/2017  AASHIR UMHOLTZ 1932/11/08 [id number]  Patient screened for potential [facility] services. Patient is in the [place] of the [place] [facility] services under patient's Medicare plan. Spoke with inpatient care management staff and patient is very weak and need for rehab.   Met with patient, wife and friend at the bedside to assess for needs.  Patient and wife states he will go for rehab short term.   Primary care provider is endorsed as Dr. [name].  Explained [facility]'s role in the network for community care management needs.  They accepted the brochure, 24 hour magnet with contact information. No current [facility] Care Management needs noted.  For questions contact:   Natividad Brood, RN BSN [facility] Liaison  [phone] business mobile phone Toll free office [phone]

## 2017-07-23 NOTE — Progress Notes (Signed)
Report called to RN at [facility], IV's removed, telebox returned, patient taken via PTAR to facility

## 2017-07-23 NOTE — Clinical Social Work Placement (Signed)
   CLINICAL SOCIAL WORK PLACEMENT  NOTE  Date:  07/23/2017  Patient Details  Name: Oscar Baker MRN: [id number] Date of Birth: 03/15/1933  Clinical Social Work is seeking post-discharge placement for this patient at the [place] level of care (*CSW will initial, date and re-position this form in  chart as items are completed):  Yes   Patient/family provided with [facility] Work Department's list of facilities offering this level of care within the geographic area requested by the patient (or if unable, by the patient's family).  Yes   Patient/family informed of their freedom to choose among providers that offer the needed level of care, that participate in Medicare, Medicaid or managed care program needed by the patient, have an available bed and are willing to accept the patient.  Yes   Patient/family informed of [facility]'s ownership interest in [facility] and [facility], as well as of the fact that they are under no obligation to receive care at these facilities.  PASRR submitted to EDS on 07/21/17     PASRR number received on 07/21/17     Existing PASRR number confirmed on       FL2 transmitted to all facilities in geographic area requested by pt/family on 07/21/17     FL2 transmitted to all facilities within larger geographic area on       Patient informed that his/her managed care company has contracts with or will negotiate with certain facilities, including the following:        Yes   Patient/family informed of bed offers received.  Patient chooses bed at [facility]     Physician recommends and patient chooses bed at      Patient to be transferred to [facility] on 07/23/17.  Patient to be transferred to facility by PTAR     Patient family notified on 07/23/17 of transfer.  Name of family member notified:  Pablo Stauffer     PHYSICIAN Please prepare prescriptions, Please sign DNR     Additional Comment:     _______________________________________________ Candie Chroman, LCSW 07/23/2017, 11:52 AM

## 2017-07-25 LAB — CULTURE, BLOOD (ROUTINE X 2)
Culture: NO GROWTH
Culture: NO GROWTH
Special Requests: ADEQUATE
Special Requests: ADEQUATE

## 2017-07-26 DIAGNOSIS — H66017 Acute suppurative otitis media with spontaneous rupture of ear drum, recurrent, unspecified ear: Secondary | ICD-10-CM | POA: Diagnosis not present

## 2017-07-26 DIAGNOSIS — E119 Type 2 diabetes mellitus without complications: Secondary | ICD-10-CM | POA: Diagnosis not present

## 2017-07-26 DIAGNOSIS — C9112 Chronic lymphocytic leukemia of B-cell type in relapse: Secondary | ICD-10-CM | POA: Diagnosis not present

## 2017-07-26 DIAGNOSIS — I495 Sick sinus syndrome: Secondary | ICD-10-CM | POA: Diagnosis not present

## 2017-07-26 DIAGNOSIS — M25551 Pain in right hip: Secondary | ICD-10-CM | POA: Diagnosis not present

## 2017-07-26 DIAGNOSIS — I10 Essential (primary) hypertension: Secondary | ICD-10-CM | POA: Diagnosis not present

## 2017-07-26 DIAGNOSIS — I480 Paroxysmal atrial fibrillation: Secondary | ICD-10-CM | POA: Diagnosis not present

## 2017-07-26 DIAGNOSIS — I5022 Chronic systolic (congestive) heart failure: Secondary | ICD-10-CM | POA: Diagnosis not present

## 2017-07-26 DIAGNOSIS — E871 Hypo-osmolality and hyponatremia: Secondary | ICD-10-CM | POA: Diagnosis not present

## 2017-07-26 DIAGNOSIS — M6281 Muscle weakness (generalized): Secondary | ICD-10-CM | POA: Diagnosis not present

## 2017-07-26 DIAGNOSIS — W19XXXA Unspecified fall, initial encounter: Secondary | ICD-10-CM | POA: Diagnosis not present

## 2017-07-26 DIAGNOSIS — N401 Enlarged prostate with lower urinary tract symptoms: Secondary | ICD-10-CM | POA: Diagnosis not present

## 2017-07-27 ENCOUNTER — Ambulatory Visit: Payer: Medicare Other | Admitting: Cardiovascular Disease

## 2017-07-28 DIAGNOSIS — I10 Essential (primary) hypertension: Secondary | ICD-10-CM | POA: Diagnosis not present

## 2017-07-28 DIAGNOSIS — M25551 Pain in right hip: Secondary | ICD-10-CM | POA: Diagnosis not present

## 2017-07-28 DIAGNOSIS — I5022 Chronic systolic (congestive) heart failure: Secondary | ICD-10-CM | POA: Diagnosis not present

## 2017-07-28 DIAGNOSIS — E871 Hypo-osmolality and hyponatremia: Secondary | ICD-10-CM | POA: Diagnosis not present

## 2017-07-28 DIAGNOSIS — I495 Sick sinus syndrome: Secondary | ICD-10-CM | POA: Diagnosis not present

## 2017-07-28 DIAGNOSIS — H66017 Acute suppurative otitis media with spontaneous rupture of ear drum, recurrent, unspecified ear: Secondary | ICD-10-CM | POA: Diagnosis not present

## 2017-07-28 DIAGNOSIS — I480 Paroxysmal atrial fibrillation: Secondary | ICD-10-CM | POA: Diagnosis not present

## 2017-07-28 DIAGNOSIS — N401 Enlarged prostate with lower urinary tract symptoms: Secondary | ICD-10-CM | POA: Diagnosis not present

## 2017-07-28 DIAGNOSIS — C9112 Chronic lymphocytic leukemia of B-cell type in relapse: Secondary | ICD-10-CM | POA: Diagnosis not present

## 2017-07-28 DIAGNOSIS — W19XXXA Unspecified fall, initial encounter: Secondary | ICD-10-CM | POA: Diagnosis not present

## 2017-07-28 DIAGNOSIS — M6281 Muscle weakness (generalized): Secondary | ICD-10-CM | POA: Diagnosis not present

## 2017-07-28 DIAGNOSIS — E119 Type 2 diabetes mellitus without complications: Secondary | ICD-10-CM | POA: Diagnosis not present

## 2017-07-29 DIAGNOSIS — F5109 Other insomnia not due to a substance or known physiological condition: Secondary | ICD-10-CM | POA: Diagnosis not present

## 2017-07-29 DIAGNOSIS — K5900 Constipation, unspecified: Secondary | ICD-10-CM | POA: Diagnosis not present

## 2017-07-30 DIAGNOSIS — N401 Enlarged prostate with lower urinary tract symptoms: Secondary | ICD-10-CM | POA: Diagnosis not present

## 2017-07-30 DIAGNOSIS — I480 Paroxysmal atrial fibrillation: Secondary | ICD-10-CM | POA: Diagnosis not present

## 2017-07-30 DIAGNOSIS — C9112 Chronic lymphocytic leukemia of B-cell type in relapse: Secondary | ICD-10-CM | POA: Diagnosis not present

## 2017-07-30 DIAGNOSIS — M6281 Muscle weakness (generalized): Secondary | ICD-10-CM | POA: Diagnosis not present

## 2017-07-30 DIAGNOSIS — H66017 Acute suppurative otitis media with spontaneous rupture of ear drum, recurrent, unspecified ear: Secondary | ICD-10-CM | POA: Diagnosis not present

## 2017-07-30 DIAGNOSIS — I10 Essential (primary) hypertension: Secondary | ICD-10-CM | POA: Diagnosis not present

## 2017-07-30 DIAGNOSIS — I5022 Chronic systolic (congestive) heart failure: Secondary | ICD-10-CM | POA: Diagnosis not present

## 2017-07-30 DIAGNOSIS — I495 Sick sinus syndrome: Secondary | ICD-10-CM | POA: Diagnosis not present

## 2017-07-30 DIAGNOSIS — E871 Hypo-osmolality and hyponatremia: Secondary | ICD-10-CM | POA: Diagnosis not present

## 2017-07-30 DIAGNOSIS — W19XXXA Unspecified fall, initial encounter: Secondary | ICD-10-CM | POA: Diagnosis not present

## 2017-07-30 DIAGNOSIS — E119 Type 2 diabetes mellitus without complications: Secondary | ICD-10-CM | POA: Diagnosis not present

## 2017-07-30 DIAGNOSIS — M25551 Pain in right hip: Secondary | ICD-10-CM | POA: Diagnosis not present

## 2017-08-02 DIAGNOSIS — E871 Hypo-osmolality and hyponatremia: Secondary | ICD-10-CM | POA: Diagnosis not present

## 2017-08-02 DIAGNOSIS — W19XXXA Unspecified fall, initial encounter: Secondary | ICD-10-CM | POA: Diagnosis not present

## 2017-08-02 DIAGNOSIS — M6281 Muscle weakness (generalized): Secondary | ICD-10-CM | POA: Diagnosis not present

## 2017-08-02 DIAGNOSIS — N401 Enlarged prostate with lower urinary tract symptoms: Secondary | ICD-10-CM | POA: Diagnosis not present

## 2017-08-02 DIAGNOSIS — H66017 Acute suppurative otitis media with spontaneous rupture of ear drum, recurrent, unspecified ear: Secondary | ICD-10-CM | POA: Diagnosis not present

## 2017-08-02 DIAGNOSIS — H6061 Unspecified chronic otitis externa, right ear: Secondary | ICD-10-CM | POA: Diagnosis not present

## 2017-08-02 DIAGNOSIS — I495 Sick sinus syndrome: Secondary | ICD-10-CM | POA: Diagnosis not present

## 2017-08-02 DIAGNOSIS — I480 Paroxysmal atrial fibrillation: Secondary | ICD-10-CM | POA: Diagnosis not present

## 2017-08-02 DIAGNOSIS — H6691 Otitis media, unspecified, right ear: Secondary | ICD-10-CM | POA: Diagnosis not present

## 2017-08-02 DIAGNOSIS — I10 Essential (primary) hypertension: Secondary | ICD-10-CM | POA: Diagnosis not present

## 2017-08-02 DIAGNOSIS — I5022 Chronic systolic (congestive) heart failure: Secondary | ICD-10-CM | POA: Diagnosis not present

## 2017-08-02 DIAGNOSIS — M25551 Pain in right hip: Secondary | ICD-10-CM | POA: Diagnosis not present

## 2017-08-02 DIAGNOSIS — C9112 Chronic lymphocytic leukemia of B-cell type in relapse: Secondary | ICD-10-CM | POA: Diagnosis not present

## 2017-08-02 DIAGNOSIS — E119 Type 2 diabetes mellitus without complications: Secondary | ICD-10-CM | POA: Diagnosis not present

## 2017-08-04 DIAGNOSIS — B356 Tinea cruris: Secondary | ICD-10-CM | POA: Diagnosis not present

## 2017-08-09 DIAGNOSIS — H66017 Acute suppurative otitis media with spontaneous rupture of ear drum, recurrent, unspecified ear: Secondary | ICD-10-CM | POA: Diagnosis not present

## 2017-08-09 DIAGNOSIS — C9112 Chronic lymphocytic leukemia of B-cell type in relapse: Secondary | ICD-10-CM | POA: Diagnosis not present

## 2017-08-09 DIAGNOSIS — W19XXXA Unspecified fall, initial encounter: Secondary | ICD-10-CM | POA: Diagnosis not present

## 2017-08-09 DIAGNOSIS — I480 Paroxysmal atrial fibrillation: Secondary | ICD-10-CM | POA: Diagnosis not present

## 2017-08-09 DIAGNOSIS — I5022 Chronic systolic (congestive) heart failure: Secondary | ICD-10-CM | POA: Diagnosis not present

## 2017-08-09 DIAGNOSIS — R339 Retention of urine, unspecified: Secondary | ICD-10-CM | POA: Diagnosis not present

## 2017-08-09 DIAGNOSIS — I10 Essential (primary) hypertension: Secondary | ICD-10-CM | POA: Diagnosis not present

## 2017-08-09 DIAGNOSIS — M6281 Muscle weakness (generalized): Secondary | ICD-10-CM | POA: Diagnosis not present

## 2017-08-09 DIAGNOSIS — I495 Sick sinus syndrome: Secondary | ICD-10-CM | POA: Diagnosis not present

## 2017-08-09 DIAGNOSIS — E119 Type 2 diabetes mellitus without complications: Secondary | ICD-10-CM | POA: Diagnosis not present

## 2017-08-09 DIAGNOSIS — M25551 Pain in right hip: Secondary | ICD-10-CM | POA: Diagnosis not present

## 2017-08-09 DIAGNOSIS — E871 Hypo-osmolality and hyponatremia: Secondary | ICD-10-CM | POA: Diagnosis not present

## 2017-08-10 ENCOUNTER — Ambulatory Visit: Payer: Medicare Other | Admitting: Podiatry

## 2017-08-10 DIAGNOSIS — H66017 Acute suppurative otitis media with spontaneous rupture of ear drum, recurrent, unspecified ear: Secondary | ICD-10-CM | POA: Diagnosis not present

## 2017-08-10 DIAGNOSIS — N401 Enlarged prostate with lower urinary tract symptoms: Secondary | ICD-10-CM | POA: Diagnosis not present

## 2017-08-10 DIAGNOSIS — E871 Hypo-osmolality and hyponatremia: Secondary | ICD-10-CM | POA: Diagnosis not present

## 2017-08-10 DIAGNOSIS — I5022 Chronic systolic (congestive) heart failure: Secondary | ICD-10-CM | POA: Diagnosis not present

## 2017-08-10 DIAGNOSIS — W19XXXA Unspecified fall, initial encounter: Secondary | ICD-10-CM | POA: Diagnosis not present

## 2017-08-10 DIAGNOSIS — I480 Paroxysmal atrial fibrillation: Secondary | ICD-10-CM | POA: Diagnosis not present

## 2017-08-10 DIAGNOSIS — I10 Essential (primary) hypertension: Secondary | ICD-10-CM | POA: Diagnosis not present

## 2017-08-10 DIAGNOSIS — E119 Type 2 diabetes mellitus without complications: Secondary | ICD-10-CM | POA: Diagnosis not present

## 2017-08-10 DIAGNOSIS — M25551 Pain in right hip: Secondary | ICD-10-CM | POA: Diagnosis not present

## 2017-08-10 DIAGNOSIS — I495 Sick sinus syndrome: Secondary | ICD-10-CM | POA: Diagnosis not present

## 2017-08-10 DIAGNOSIS — M6281 Muscle weakness (generalized): Secondary | ICD-10-CM | POA: Diagnosis not present

## 2017-08-10 DIAGNOSIS — C9112 Chronic lymphocytic leukemia of B-cell type in relapse: Secondary | ICD-10-CM | POA: Diagnosis not present

## 2017-08-11 ENCOUNTER — Ambulatory Visit (INDEPENDENT_AMBULATORY_CARE_PROVIDER_SITE_OTHER): Payer: Medicare Other | Admitting: Physician Assistant

## 2017-08-11 ENCOUNTER — Encounter: Payer: Self-pay | Admitting: Physician Assistant

## 2017-08-11 VITALS — BP 122/50 | HR 65 | Ht 70.0 in | Wt 195.0 lb

## 2017-08-11 DIAGNOSIS — I482 Chronic atrial fibrillation: Secondary | ICD-10-CM | POA: Diagnosis not present

## 2017-08-11 DIAGNOSIS — Z950 Presence of cardiac pacemaker: Secondary | ICD-10-CM | POA: Diagnosis not present

## 2017-08-11 DIAGNOSIS — E119 Type 2 diabetes mellitus without complications: Secondary | ICD-10-CM | POA: Diagnosis not present

## 2017-08-11 DIAGNOSIS — M25551 Pain in right hip: Secondary | ICD-10-CM | POA: Diagnosis not present

## 2017-08-11 DIAGNOSIS — I10 Essential (primary) hypertension: Secondary | ICD-10-CM

## 2017-08-11 DIAGNOSIS — I420 Dilated cardiomyopathy: Secondary | ICD-10-CM | POA: Diagnosis not present

## 2017-08-11 DIAGNOSIS — I5022 Chronic systolic (congestive) heart failure: Secondary | ICD-10-CM

## 2017-08-11 DIAGNOSIS — S7001XA Contusion of right hip, initial encounter: Secondary | ICD-10-CM | POA: Diagnosis not present

## 2017-08-11 DIAGNOSIS — Z96642 Presence of left artificial hip joint: Secondary | ICD-10-CM | POA: Insufficient documentation

## 2017-08-11 DIAGNOSIS — I4821 Permanent atrial fibrillation: Secondary | ICD-10-CM

## 2017-08-11 DIAGNOSIS — Z96641 Presence of right artificial hip joint: Secondary | ICD-10-CM | POA: Diagnosis not present

## 2017-08-11 DIAGNOSIS — Z01810 Encounter for preprocedural cardiovascular examination: Secondary | ICD-10-CM | POA: Diagnosis not present

## 2017-08-11 DIAGNOSIS — Z96643 Presence of artificial hip joint, bilateral: Secondary | ICD-10-CM | POA: Insufficient documentation

## 2017-08-11 NOTE — Progress Notes (Signed)
Cardiology Office Note:    Date:  08/11/2017   ID:  Oscar Baker, Oscar Baker 08-17-1932, MRN [id number]  PCP:  [name], MD  Cardiologist:  Mertie Moores, MD   Referring MD: [name], MD   Chief Complaint  Patient presents with  . Hospitalization Follow-up    s/p fall; heart failure    History of Present Illness:    Oscar Baker is a 84 y.o. male with atrial fibrillation/flutter, sick sinus syndrome s/p pacemaker in 2015, diabetes, hypertension, hyperlipidemia, chronic lymphocytic leukemia, prostate cancer.  CHADS2-VASc=4 (HTN, DM, 84 yo).  He is on long-term anticoagulation with Coumadin.  Last seen by Dr. Acie Fredrickson December 2018.  Surgical clearance was recently requested by Dr. Diona Fanti for transurethral resection of bladder tumor (date TBD).  He was admitted 1/28-2/1 after mechanical fall at home in the setting of otitis media and spontaneous rupture of the tympanic membrane.  He was noted to be short of breath with pulmonary vascular congestion on chest x-ray.  Echocardiogram demonstrated worsened LV function with an EF of 45-50% and inferior hypokinesis.  Troponin was negative x1.  BNP was elevated at 728.  He was diuresed with IV Lasix.  He was not seen by cardiology.  He was not discharged on Lasix.  [name] returns for follow-up.  He is here today with his wife.  He was discharged to [facility].  He was discharged home yesterday.  He tells me that he has been having issues with balance for the last several weeks.  He notes lower extremity edema from time to time.  It may have been worse before he went to the hospital.  His edema is better now.  He does not remember being short of breath.  However, he does feel that his breathing is better now.  His weight is down about 7 pounds since discharge from the hospital.  He denies orthopnea or PND.  He denies syncope.  When he fell, he was getting up from a chair and his leg felt tight and he lost his balance.  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 Chronic systolic heart failure (HCC)    Echo 1/19: Mild LVH, EF 45-50, inf HK, MAC, severe LAE, severe RAE // Echo 7/15: Mild LVH, mod focal basal sept hypertrophy, EF 55-60, AV peak and mean 16/9, trivial MR, mod LAE, PASP 38  . CLL (chronic lymphocytic leukemia) ([place])   . Diabetes mellitus type 2, noninsulin dependent ([place])   . 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 History of nuclear stress test    Nuclear stress test 12/0 7: No ischemia, EF 51  . Hyperlipidemia   . Hypertension   . Pacemaker 02/08/2014   DR Lovena Le  . prostate ca dx'd 2006   xrt; prostatectomy  . Sick sinus syndrome (  [place])    a-Flutter with episodes of bradycardia; S/P Biotronik (serial number [id number])    Past Surgical History:  Procedure Laterality Date  . APPENDECTOMY    . BACK SURGERY     disk  . INSERT / REPLACE / REMOVE PACEMAKER  02/08/2014    DR Lovena Le  . PACEMAKER INSERTION  02/08/2014   Biotronik (serial number [id number])  . PERMANENT PACEMAKER INSERTION N/A 02/08/2014   Procedure: PERMANENT PACEMAKER INSERTION;  Surgeon: Evans Lance, MD;  Location: [facility] CATH LAB;  Service: Cardiovascular;  Laterality: N/A;  . TONSILLECTOMY    . TOTAL HIP ARTHROPLASTY Left 05/12/2016   Procedure: LEFT TOTAL HIP ARTHROPLASTY ANTERIOR  APPROACH;  Surgeon: Paralee Cancel, MD;  Location: [facility] ORS;  Service: Orthopedics;  Laterality: Left;  . TOTAL KNEE ARTHROPLASTY      Current Medications: Current Meds  Medication Sig  . acetaminophen (TYLENOL) 500 MG tablet Take 1,000 mg by mouth as needed for mild pain.  [name] amLODipine (NORVASC) 10 MG tablet Take 10 mg by mouth at bedtime.   [name] aspirin 81 MG tablet Take 81 mg by mouth at bedtime. Will stop prior to surgery  . atorvastatin (LIPITOR) 20 MG tablet TAKE 1 TABLET BY MOUTH DAILY AT 6PM  . fenofibrate micronized (LOFIBRA) 200 MG capsule TAKE 1 CAPSULE BY MOUTH DAILY BEFORE BREAKFAST  . fluticasone (FLONASE) 50 MCG/ACT nasal spray Place 1 spray into both nostrils as needed for allergies or rhinitis.  [name] glipiZIDE (GLUCOTROL XL) 2.5 MG 24 hr tablet Take 2.5 mg by mouth daily with breakfast.  . JANUVIA 100 MG tablet Take 100 mg by mouth at bedtime.   . metFORMIN (GLUCOPHAGE) 1000 MG tablet Take 1,000 mg by mouth 2 (two) times daily with a meal.    . multivitamin (THERAGRAN) per tablet Take 1 tablet by mouth daily. Will stop prior to procedure  . vitamin B-12 (CYANOCOBALAMIN) 1000 MCG tablet Take 1,000 mcg by mouth daily. Will stop prior to procedure  . warfarin (COUMADIN) 5 MG tablet TAKE AS DIRECTED BY COUMADIN CLINIC     Allergies:   Altace [ramipril]; Codeine; and Simvastatin   Social History   Tobacco Use  . Smoking status: Former Smoker    Years: 25.00    Types: Pipe, Cigars    Last attempt to quit: 11/25/1975    Years since quitting: 41.7  . Smokeless tobacco: Never Used  Substance Use Topics  . Alcohol use: Yes    Alcohol/week: 0.0 oz    Comment: occasional  . Drug use: No     Family Hx: The patient's family history includes Allergic rhinitis in his brother and mother; Emphysema in his mother; Heart attack in his father; Pulmonary fibrosis in his brother.  ROS:   Please see the history of present illness.    Review of Systems  Constitution: Positive for  malaise/fatigue and weight loss.  HENT: Positive for hearing loss.   Cardiovascular: Positive for irregular heartbeat and leg swelling.  Gastrointestinal: Positive for constipation.  Genitourinary: Positive for hematuria and incomplete emptying.  Neurological: Positive for loss of balance.   All other systems reviewed and are negative.   EKGs/Labs/Other Test Reviewed:    EKG:  EKG is  ordered today.  The ekg ordered today demonstrates probable underlying atrial fibrillation, ventricular paced, heart rate 65  Recent Labs: 07/19/2017: ALT 19; B Natriuretic Peptide 727.6 07/22/2017: Magnesium 1.8; TSH 0.436 07/23/2017: BUN 23; Creatinine, Ser 1.10; Hemoglobin 9.0; Platelets 511; Potassium 4.3; Sodium 128   Recent  Lipid Panel Lab Results  Component Value Date/Time   CHOL 145 05/28/2015 10:59 AM   TRIG 107 05/28/2015 10:59 AM   HDL 23 (L) 05/28/2015 10:59 AM   CHOLHDL 6.3 (H) 05/28/2015 10:59 AM   LDLCALC 101 05/28/2015 10:59 AM   LDLDIRECT 133.8 07/13/2012 09:19 AM    Physical Exam:    VS:  BP (!) 122/50   Pulse 65   Ht _0  (1.778 m)   Wt 195 lb (88.5 kg)   SpO2 96%   BMI 27.98 kg/m     Wt Readings from Last 3 Encounters:  08/11/17 195 lb (88.5 kg)  07/23/17 202 lb 13.2 oz (92 kg)  06/02/17 208 lb 6.4 oz (94.5 kg)     Physical Exam  Constitutional: He is oriented to person, place, and time. He appears well-developed and well-nourished. No distress.  HENT:  Head: Normocephalic and atraumatic.  Neck: No JVD present.  Cardiovascular: Normal rate and regular rhythm.  No murmur heard. Pulmonary/Chest: Effort normal. He has no rales.  Abdominal: Soft.  Musculoskeletal: He exhibits edema (trace bilat edema).  Neurological: He is alert and oriented to person, place, and time.  Skin: Skin is warm and dry.    ASSESSMENT & PLAN:    1.  Chronic systolic CHF (congestive heart failure) ([place]) Recent admission to the hospital with mechanical fall in the setting of otitis  media and perforated tympanic membrane as well as sinusitis.  He does remember receiving significant amounts of IV fluids.  He had pulmonary vascular congestion on his chest x-ray.  Echocardiogram demonstrates EF 45-50% with inferior hypokinesis.  His ejection fraction was previously normal.  He had one troponin that was negative.  His breathing has been fairly stable/improved since discharge.  He has no evidence of volume overload on exam today.  He needs surgical clearance for upcoming bladder surgery.  As he is a diabetic, he is at high risk for coronary artery disease.  -Arrange Lexiscan Myoview  -Consider changing amlodipine to beta-blocker, hydralazine/nitrates (questionable ACE allergy)  2.  Dilated cardiomyopathy ([place]) Echocardiogram in the hospital demonstrated EF 45-50 with inferior hypokinesis.  Arrange nuclear stress test as outlined above.  3.  Preoperative cardiovascular examination He needs bladder surgery in the near future with Dr. Diona Fanti.  Given his reduced ejection fraction, we will proceed with nuclear stress test initially.  He will follow-up in the next 2 weeks to make further recommendations.  4.  Permanent atrial fibrillation (HCC) Continue Coumadin for anticoagulation.  Once we are able to make a disposition regarding his surgery, we will need to refer him back to the Coumadin clinic to make recommendations regarding holding his Coumadin for his bladder surgery.  Of note, he really has no indication to be on aspirin in addition to Coumadin.  If his stress test is low risk, we will need to discontinue his aspirin.  5.  Essential hypertension The patient's blood pressure is controlled on his current regimen.  Continue current therapy.   6.  Diabetes mellitus type 2, noninsulin dependent ([place]) Continue follow-up with primary care  7.  Cardiac pacemaker in situ Follow-up with EP as planned.   Dispo:  Return in about 2 weeks (around 08/25/2017) for Close Follow Up, w/ Dr.  Acie Fredrickson, or Richardson Dopp, PA-C.   Medication Adjustments/Labs and Tests Ordered: Current medicines are reviewed at length with the patient today.  Concerns regarding medicines are outlined above.  Tests Ordered: Orders Placed This Encounter  Procedures  . Myocardial Perfusion  Imaging  . EKG 12-Lead   Medication Changes: No orders of the defined types were placed in this encounter.   Signed, Richardson Dopp, PA-C  08/11/2017 5:34 PM    [facility] [address], [place], [place]  [address] Phone: [phone]; Fax: [phone]

## 2017-08-11 NOTE — Patient Instructions (Addendum)
Medication Instructions:  1. Your physician recommends that you continue on your current medications as directed. Please refer to the Current Medication list given to you today.   Labwork: NONE ORDERED TODAY  Testing/Procedures: Your physician has requested that you have a lexiscan myoview. For further information please visit [email or web address]. Please follow instruction sheet, as given.    Follow-Up: DR. Acie Fredrickson OR SCOTT WEAVER, PAC SAME DAY DR. Acie Fredrickson IS IN THE OFFICE IN 2 WEEKS ; YOU CAN DO MARCH 13TH FOR SCOTT WEAVER, PAC ; DR. Acie Fredrickson WILL BE IN THE OFFICE THAT DAY  Any Other Special Instructions Will Be Listed Below (If Applicable).     If you need a refill on your cardiac medications before your next appointment, please call your pharmacy.

## 2017-08-13 DIAGNOSIS — G8929 Other chronic pain: Secondary | ICD-10-CM | POA: Diagnosis not present

## 2017-08-13 DIAGNOSIS — C61 Malignant neoplasm of prostate: Secondary | ICD-10-CM | POA: Diagnosis not present

## 2017-08-13 DIAGNOSIS — M25551 Pain in right hip: Secondary | ICD-10-CM | POA: Diagnosis not present

## 2017-08-13 DIAGNOSIS — Z7901 Long term (current) use of anticoagulants: Secondary | ICD-10-CM | POA: Diagnosis not present

## 2017-08-13 DIAGNOSIS — I110 Hypertensive heart disease with heart failure: Secondary | ICD-10-CM | POA: Diagnosis not present

## 2017-08-13 DIAGNOSIS — E119 Type 2 diabetes mellitus without complications: Secondary | ICD-10-CM | POA: Diagnosis not present

## 2017-08-13 DIAGNOSIS — I480 Paroxysmal atrial fibrillation: Secondary | ICD-10-CM | POA: Diagnosis not present

## 2017-08-13 DIAGNOSIS — Z5181 Encounter for therapeutic drug level monitoring: Secondary | ICD-10-CM | POA: Diagnosis not present

## 2017-08-13 DIAGNOSIS — C9110 Chronic lymphocytic leukemia of B-cell type not having achieved remission: Secondary | ICD-10-CM | POA: Diagnosis not present

## 2017-08-13 DIAGNOSIS — I5022 Chronic systolic (congestive) heart failure: Secondary | ICD-10-CM | POA: Diagnosis not present

## 2017-08-16 ENCOUNTER — Telehealth (HOSPITAL_COMMUNITY): Payer: Self-pay | Admitting: *Deleted

## 2017-08-16 DIAGNOSIS — H6691 Otitis media, unspecified, right ear: Secondary | ICD-10-CM | POA: Diagnosis not present

## 2017-08-16 DIAGNOSIS — H903 Sensorineural hearing loss, bilateral: Secondary | ICD-10-CM | POA: Diagnosis not present

## 2017-08-16 DIAGNOSIS — H6061 Unspecified chronic otitis externa, right ear: Secondary | ICD-10-CM | POA: Diagnosis not present

## 2017-08-16 NOTE — Telephone Encounter (Signed)
Patient given detailed instructions per Myocardial Perfusion Study Information Sheet for the test on 08/18/17 at 0945. Patient notified to arrive 15 minutes early and that it is imperative to arrive on time for appointment to keep from having the test rescheduled.  If you need to cancel or reschedule your appointment, please call the office within 24 hours of your appointment. . Patient verbalized understanding.Ailene Royal, [place]anae [place]

## 2017-08-18 ENCOUNTER — Telehealth: Payer: Self-pay | Admitting: *Deleted

## 2017-08-18 ENCOUNTER — Ambulatory Visit (HOSPITAL_COMMUNITY): Payer: Medicare Other | Attending: Cardiovascular Disease

## 2017-08-18 ENCOUNTER — Encounter: Payer: Self-pay | Admitting: Physician Assistant

## 2017-08-18 DIAGNOSIS — I493 Ventricular premature depolarization: Secondary | ICD-10-CM | POA: Diagnosis not present

## 2017-08-18 DIAGNOSIS — I499 Cardiac arrhythmia, unspecified: Secondary | ICD-10-CM | POA: Diagnosis not present

## 2017-08-18 DIAGNOSIS — I519 Heart disease, unspecified: Secondary | ICD-10-CM | POA: Diagnosis not present

## 2017-08-18 DIAGNOSIS — R0602 Shortness of breath: Secondary | ICD-10-CM | POA: Insufficient documentation

## 2017-08-18 DIAGNOSIS — R55 Syncope and collapse: Secondary | ICD-10-CM | POA: Diagnosis not present

## 2017-08-18 DIAGNOSIS — E119 Type 2 diabetes mellitus without complications: Secondary | ICD-10-CM | POA: Diagnosis not present

## 2017-08-18 DIAGNOSIS — Z8249 Family history of ischemic heart disease and other diseases of the circulatory system: Secondary | ICD-10-CM | POA: Diagnosis not present

## 2017-08-18 DIAGNOSIS — G8929 Other chronic pain: Secondary | ICD-10-CM | POA: Diagnosis not present

## 2017-08-18 DIAGNOSIS — M25551 Pain in right hip: Secondary | ICD-10-CM | POA: Diagnosis not present

## 2017-08-18 DIAGNOSIS — I110 Hypertensive heart disease with heart failure: Secondary | ICD-10-CM | POA: Diagnosis not present

## 2017-08-18 DIAGNOSIS — I480 Paroxysmal atrial fibrillation: Secondary | ICD-10-CM | POA: Diagnosis not present

## 2017-08-18 DIAGNOSIS — I5022 Chronic systolic (congestive) heart failure: Secondary | ICD-10-CM | POA: Diagnosis not present

## 2017-08-18 DIAGNOSIS — I420 Dilated cardiomyopathy: Secondary | ICD-10-CM | POA: Insufficient documentation

## 2017-08-18 DIAGNOSIS — R110 Nausea: Secondary | ICD-10-CM | POA: Diagnosis not present

## 2017-08-18 LAB — MYOCARDIAL PERFUSION IMAGING
CHL CUP RESTING HR STRESS: 64 {beats}/min
CSEPPHR: 81 {beats}/min
LHR: 0.36
SDS: 0
SRS: 9
SSS: 9
TID: 0.91

## 2017-08-18 MED ORDER — TECHNETIUM TC 99M TETROFOSMIN IV KIT
10.2000 | PACK | Freq: Once | INTRAVENOUS | Status: AC | PRN
  Administered 2017-08-18: 10.2 via INTRAVENOUS
  Filled 2017-08-18: qty 11

## 2017-08-18 MED ORDER — REGADENOSON 0.4 MG/5ML IV SOLN
0.4000 mg | Freq: Once | INTRAVENOUS | Status: AC
  Administered 2017-08-18: 0.4 mg via INTRAVENOUS

## 2017-08-18 MED ORDER — TECHNETIUM TC 99M TETROFOSMIN IV KIT
32.8000 | PACK | Freq: Once | INTRAVENOUS | Status: AC | PRN
  Administered 2017-08-18: 32.8 via INTRAVENOUS
  Filled 2017-08-18: qty 33

## 2017-08-18 NOTE — Telephone Encounter (Signed)
Tried to reach pt to go over Myoview. No answer. I will try again tomorrow.

## 2017-08-18 NOTE — Telephone Encounter (Signed)
-----   Message from Liliane Shi, [place] sent at 08/18/2017  2:51 PM EST ----- His stress test suggests that he had a heart attack at some point in the past.  There is no evidence of ischemia (loss of blood flow from a blockage).  I reviewed this with Dr. Acie Fredrickson.  Unless he has chest pain or worsening shortness of breath, we can continue to manage him with medication.  He should be ok to proceed with his bladder surgery.  PLAN:  1. Decrease Amlodipine to 5 mg Once daily 2. Start Coreg 3.125 mg Twice daily  3. Please find out if he has his bladder surgery scheduled yet and let me know when it is. 4. Keep follow up as planned.  Please fax a copy of this study result to his PCP:  [name], MD  Richardson Dopp, PA-C    08/18/2017 2:45 PM

## 2017-08-19 DIAGNOSIS — I480 Paroxysmal atrial fibrillation: Secondary | ICD-10-CM | POA: Diagnosis not present

## 2017-08-19 DIAGNOSIS — M25551 Pain in right hip: Secondary | ICD-10-CM | POA: Diagnosis not present

## 2017-08-19 DIAGNOSIS — E119 Type 2 diabetes mellitus without complications: Secondary | ICD-10-CM | POA: Diagnosis not present

## 2017-08-19 DIAGNOSIS — G8929 Other chronic pain: Secondary | ICD-10-CM | POA: Diagnosis not present

## 2017-08-19 DIAGNOSIS — I110 Hypertensive heart disease with heart failure: Secondary | ICD-10-CM | POA: Diagnosis not present

## 2017-08-19 DIAGNOSIS — I5022 Chronic systolic (congestive) heart failure: Secondary | ICD-10-CM | POA: Diagnosis not present

## 2017-08-19 LAB — POCT INR: INR: 1.3

## 2017-08-19 NOTE — Telephone Encounter (Signed)
-----   Message from Liliane Shi, [place] sent at 08/18/2017  2:51 PM EST ----- His stress test suggests that he had a heart attack at some point in the past.  There is no evidence of ischemia (loss of blood flow from a blockage).  I reviewed this with Dr. Acie Fredrickson.  Unless he has chest pain or worsening shortness of breath, we can continue to manage him with medication.  He should be ok to proceed with his bladder surgery.  PLAN:  1. Decrease Amlodipine to 5 mg Once daily 2. Start Coreg 3.125 mg Twice daily  3. Please find out if he has his bladder surgery scheduled yet and let me know when it is. 4. Keep follow up as planned.  Please fax a copy of this study result to his PCP:  [name], MD  Richardson Dopp, PA-C    08/18/2017 2:45 PM

## 2017-08-19 NOTE — Telephone Encounter (Signed)
I tried to reach pt today as well to go over results and medication recommendations. No answer again today.

## 2017-08-20 ENCOUNTER — Ambulatory Visit (INDEPENDENT_AMBULATORY_CARE_PROVIDER_SITE_OTHER): Payer: Medicare Other | Admitting: Cardiovascular Disease

## 2017-08-20 DIAGNOSIS — Z8619 Personal history of other infectious and parasitic diseases: Secondary | ICD-10-CM

## 2017-08-20 DIAGNOSIS — I482 Chronic atrial fibrillation: Secondary | ICD-10-CM | POA: Diagnosis not present

## 2017-08-20 DIAGNOSIS — Z5181 Encounter for therapeutic drug level monitoring: Secondary | ICD-10-CM

## 2017-08-20 DIAGNOSIS — I4821 Permanent atrial fibrillation: Secondary | ICD-10-CM

## 2017-08-20 HISTORY — DX: Personal history of other infectious and parasitic diseases: Z86.19

## 2017-08-20 MED ORDER — CARVEDILOL 3.125 MG PO TABS: 3.1250 mg | ORAL_TABLET | Freq: Two times a day (BID) | ORAL | 3 refills | Status: DC

## 2017-08-20 NOTE — Patient Instructions (Signed)
Description   Today March 1st take 1 and 1/2 tablets (7.5mg ) then continue on same dosage 1 tablet every day. Recheck INR on Tuesday March 5th  Call   Coumadin Clinic [phone] with any concerns Pt wants to have INR checked in the coumadin clinic Ask pt if he wants home health to check INR or to come back to clinic Please call [facility] [phone] if wants them to check INR

## 2017-08-20 NOTE — Telephone Encounter (Signed)
-----   Message from Liliane Shi, [place] sent at 08/18/2017  2:51 PM EST ----- His stress test suggests that he had a heart attack at some point in the past.  There is no evidence of ischemia (loss of blood flow from a blockage).  I reviewed this with Dr. Acie Fredrickson.  Unless he has chest pain or worsening shortness of breath, we can continue to manage him with medication.  He should be ok to proceed with his bladder surgery.  PLAN:  1. Decrease Amlodipine to 5 mg Once daily 2. Start Coreg 3.125 mg Twice daily  3. Please find out if he has his bladder surgery scheduled yet and let me know when it is. 4. Keep follow up as planned.  Please fax a copy of this study result to his PCP:  [name], MD  Richardson Dopp, PA-C    08/18/2017 2:45 PM

## 2017-08-20 NOTE — Telephone Encounter (Signed)
Pt has been notified of Myoview results and findings by phone with verbal understanding. When going over medications changes with the pt he states to me that he has not been taking the amlodipine, said Dr. Acie Fredrickson took him off Amlodipine.   I reviewed Dr. Jamie Brookes notes from 06/02/17. Dr. Acie Fredrickson dictated: Hypertension-   .BP is great today despite the fact that he has not had amlodipine in 2-3 weeks.  I have advised him to continue to follow his blood pressure on a regular basis.  He is lost his amlodipine when he moved.  If his blood pressure increases we will try to provide him samples until he is able to refill his prescriptions.  Currently, his insurance company will not pay for additional medications.   I advised pt I will check with Dr. Acie Fredrickson as to plan for the amlodipine. I did advise pt that we do want him to start Coreg 3.125 mg BID. Pt states his surgery has not yet been scheduled as to they are waiting on Cardiology clearance. I advised pt to keep his appt 3/13 with Richardson Dopp, PA. Pt is agreeable to plan of care. New Rx has been sent in for Coreg 3.125 BID. Advised pt I will call back as to recommendation for Amlodipine. Pt thanked me for my call and my help today.   I reviewed with Dr. Acie Fredrickson as to recommendations for Amlodipine for the pt. Per Dr. Acie Fredrickson pt can stay off the Amlodipine and we will continue with the new start of Coreg 3.125mg  BID. Pt asked if we could tell when he had the heart attack. I answered that Nicki Reaper did not mention as to how long ago in the past. Advised pt not sure if they can tell, though he could d/w Scott W. PA 3/13 when he see's him. Pt again thanked me for my help.

## 2017-08-20 NOTE — Telephone Encounter (Signed)
Left message today to go over Myoview and recommendations. [name]

## 2017-08-24 ENCOUNTER — Ambulatory Visit (INDEPENDENT_AMBULATORY_CARE_PROVIDER_SITE_OTHER): Payer: Medicare Other | Admitting: *Deleted

## 2017-08-24 DIAGNOSIS — I4892 Unspecified atrial flutter: Secondary | ICD-10-CM | POA: Diagnosis not present

## 2017-08-24 DIAGNOSIS — I482 Chronic atrial fibrillation: Secondary | ICD-10-CM | POA: Diagnosis not present

## 2017-08-24 DIAGNOSIS — I4891 Unspecified atrial fibrillation: Secondary | ICD-10-CM

## 2017-08-24 DIAGNOSIS — Z5181 Encounter for therapeutic drug level monitoring: Secondary | ICD-10-CM | POA: Diagnosis not present

## 2017-08-24 DIAGNOSIS — I4821 Permanent atrial fibrillation: Secondary | ICD-10-CM

## 2017-08-24 LAB — POCT INR: INR: 1.6

## 2017-08-24 NOTE — Patient Instructions (Signed)
Description   Today take 1 and 1/2 tablets then start taking 1 tablet every day except 1.5 tablets on Saturdays. Recheck INR on Call Coumadin Clinic [phone] with any concerns Pt wants to have INR checked in the coumadin clinic Ask pt if he wants home health to check INR or to come back to clinic Please call [facility] [phone] if wants them to check INR

## 2017-08-25 ENCOUNTER — Encounter: Payer: Self-pay | Admitting: Cardiovascular Disease

## 2017-08-25 DIAGNOSIS — E119 Type 2 diabetes mellitus without complications: Secondary | ICD-10-CM | POA: Diagnosis not present

## 2017-08-25 DIAGNOSIS — I110 Hypertensive heart disease with heart failure: Secondary | ICD-10-CM | POA: Diagnosis not present

## 2017-08-25 DIAGNOSIS — M25551 Pain in right hip: Secondary | ICD-10-CM | POA: Diagnosis not present

## 2017-08-25 DIAGNOSIS — G8929 Other chronic pain: Secondary | ICD-10-CM | POA: Diagnosis not present

## 2017-08-25 DIAGNOSIS — I5022 Chronic systolic (congestive) heart failure: Secondary | ICD-10-CM | POA: Diagnosis not present

## 2017-08-25 DIAGNOSIS — I480 Paroxysmal atrial fibrillation: Secondary | ICD-10-CM | POA: Diagnosis not present

## 2017-08-26 DIAGNOSIS — I129 Hypertensive chronic kidney disease with stage 1 through stage 4 chronic kidney disease, or unspecified chronic kidney disease: Secondary | ICD-10-CM | POA: Diagnosis not present

## 2017-08-26 DIAGNOSIS — H903 Sensorineural hearing loss, bilateral: Secondary | ICD-10-CM | POA: Diagnosis not present

## 2017-08-26 DIAGNOSIS — Z7901 Long term (current) use of anticoagulants: Secondary | ICD-10-CM | POA: Diagnosis not present

## 2017-08-26 DIAGNOSIS — C9100 Acute lymphoblastic leukemia not having achieved remission: Secondary | ICD-10-CM | POA: Diagnosis not present

## 2017-08-26 DIAGNOSIS — Z6829 Body mass index (BMI) 29.0-29.9, adult: Secondary | ICD-10-CM | POA: Diagnosis not present

## 2017-08-26 DIAGNOSIS — M25551 Pain in right hip: Secondary | ICD-10-CM | POA: Diagnosis not present

## 2017-08-26 DIAGNOSIS — M1990 Unspecified osteoarthritis, unspecified site: Secondary | ICD-10-CM | POA: Diagnosis not present

## 2017-08-26 DIAGNOSIS — C61 Malignant neoplasm of prostate: Secondary | ICD-10-CM | POA: Diagnosis not present

## 2017-08-26 DIAGNOSIS — G8929 Other chronic pain: Secondary | ICD-10-CM | POA: Diagnosis not present

## 2017-08-26 DIAGNOSIS — H6120 Impacted cerumen, unspecified ear: Secondary | ICD-10-CM | POA: Diagnosis not present

## 2017-08-26 DIAGNOSIS — I480 Paroxysmal atrial fibrillation: Secondary | ICD-10-CM | POA: Diagnosis not present

## 2017-08-26 DIAGNOSIS — R2681 Unsteadiness on feet: Secondary | ICD-10-CM | POA: Diagnosis not present

## 2017-08-26 DIAGNOSIS — H66011 Acute suppurative otitis media with spontaneous rupture of ear drum, right ear: Secondary | ICD-10-CM | POA: Diagnosis not present

## 2017-08-26 DIAGNOSIS — I5022 Chronic systolic (congestive) heart failure: Secondary | ICD-10-CM | POA: Diagnosis not present

## 2017-08-26 DIAGNOSIS — H9202 Otalgia, left ear: Secondary | ICD-10-CM | POA: Diagnosis not present

## 2017-08-26 DIAGNOSIS — E119 Type 2 diabetes mellitus without complications: Secondary | ICD-10-CM | POA: Diagnosis not present

## 2017-08-26 DIAGNOSIS — N3289 Other specified disorders of bladder: Secondary | ICD-10-CM | POA: Diagnosis not present

## 2017-08-26 DIAGNOSIS — I110 Hypertensive heart disease with heart failure: Secondary | ICD-10-CM | POA: Diagnosis not present

## 2017-08-26 DIAGNOSIS — B029 Zoster without complications: Secondary | ICD-10-CM | POA: Diagnosis not present

## 2017-08-26 DIAGNOSIS — I2510 Atherosclerotic heart disease of native coronary artery without angina pectoris: Secondary | ICD-10-CM | POA: Diagnosis not present

## 2017-08-27 DIAGNOSIS — E119 Type 2 diabetes mellitus without complications: Secondary | ICD-10-CM | POA: Diagnosis not present

## 2017-08-27 DIAGNOSIS — I110 Hypertensive heart disease with heart failure: Secondary | ICD-10-CM | POA: Diagnosis not present

## 2017-08-27 DIAGNOSIS — M25551 Pain in right hip: Secondary | ICD-10-CM | POA: Diagnosis not present

## 2017-08-27 DIAGNOSIS — I480 Paroxysmal atrial fibrillation: Secondary | ICD-10-CM | POA: Diagnosis not present

## 2017-08-27 DIAGNOSIS — G8929 Other chronic pain: Secondary | ICD-10-CM | POA: Diagnosis not present

## 2017-08-27 DIAGNOSIS — I5022 Chronic systolic (congestive) heart failure: Secondary | ICD-10-CM | POA: Diagnosis not present

## 2017-08-30 DIAGNOSIS — H6521 Chronic serous otitis media, right ear: Secondary | ICD-10-CM | POA: Diagnosis not present

## 2017-08-30 DIAGNOSIS — B029 Zoster without complications: Secondary | ICD-10-CM | POA: Diagnosis not present

## 2017-09-01 ENCOUNTER — Ambulatory Visit (INDEPENDENT_AMBULATORY_CARE_PROVIDER_SITE_OTHER): Payer: Medicare Other | Admitting: Physician Assistant

## 2017-09-01 ENCOUNTER — Ambulatory Visit (INDEPENDENT_AMBULATORY_CARE_PROVIDER_SITE_OTHER): Payer: Medicare Other | Admitting: Pharmacist

## 2017-09-01 ENCOUNTER — Encounter: Payer: Self-pay | Admitting: Physician Assistant

## 2017-09-01 VITALS — BP 116/48 | HR 61 | Ht 70.0 in | Wt 200.0 lb

## 2017-09-01 DIAGNOSIS — Z01810 Encounter for preprocedural cardiovascular examination: Secondary | ICD-10-CM | POA: Diagnosis not present

## 2017-09-01 DIAGNOSIS — I482 Chronic atrial fibrillation: Secondary | ICD-10-CM | POA: Diagnosis not present

## 2017-09-01 DIAGNOSIS — I5022 Chronic systolic (congestive) heart failure: Secondary | ICD-10-CM

## 2017-09-01 DIAGNOSIS — Z950 Presence of cardiac pacemaker: Secondary | ICD-10-CM | POA: Diagnosis not present

## 2017-09-01 DIAGNOSIS — I4821 Permanent atrial fibrillation: Secondary | ICD-10-CM

## 2017-09-01 DIAGNOSIS — I255 Ischemic cardiomyopathy: Secondary | ICD-10-CM

## 2017-09-01 HISTORY — DX: Ischemic cardiomyopathy: I25.5

## 2017-09-01 LAB — POCT INR: INR: 2.6

## 2017-09-01 MED ORDER — FUROSEMIDE 20 MG PO TABS: 20.0000 mg | ORAL_TABLET | Freq: Every day | ORAL | 6 refills | Status: DC | PRN

## 2017-09-01 MED ORDER — FUROSEMIDE 20 MG PO TABS: 20.0000 mg | ORAL_TABLET | Freq: Every day | ORAL | 0 refills | Status: DC | PRN

## 2017-09-01 NOTE — Patient Instructions (Addendum)
Medication Instructions:  You can take Lasix 20 mg (Furosemide) once daily as needed. Weigh daily.   If your weight increases by 3 lbs in 1 day or 5 lbs in 1 week or you have increased leg swelling, take Lasix that day. Call if you have to take Lasix more than 2 days in a row.  STOP ASPIRIN  Labwork: None   Testing/Procedures: None   Follow-Up: Mertie Moores, MD ON December 02, 2017 @ 8 AM   Any Other Special Instructions Will Be Listed Below (If Applicable).  If you need a refill on your cardiac medications before your next appointment, please call your pharmacy.

## 2017-09-01 NOTE — Patient Instructions (Signed)
Description   Continue 1 tablet every day except 1.5 tablets on Saturdays. Recheck INR in 2 weeks. Call Coumadin Clinic [phone] with any concerns

## 2017-09-01 NOTE — Progress Notes (Signed)
Cardiology Office Note:    Date:  09/01/2017   ID:  Baker, Oscar [birth year], MRN [id number]  PCP:  [name], MD  Cardiologist:  Mertie Moores, MD   Referring MD: [name], MD   Chief Complaint  Patient presents with  . Follow-up    CHF, recent stress test    History of Present Illness:    Oscar Baker is a 84 y.o. male with atrial fibrillation/flutter, sick sinus syndrome s/p pacemaker in 2015, diabetes, hypertension, hyperlipidemia, chronic lymphocytic leukemia, prostate cancer.  CHADS2-VASc=4 (HTN, DM, 84 yo).  He is on long-term anticoagulation with Coumadin.  He has a bladder tumor and surgical clearance has been requested by his urologist for transurethral resection.  However, he was admitted in January 2019 after a mechanical fall at home.  He was noted to have pulmonary vascular congestion on chest x-ray during that admission and his EF was 45-50 with inferior hypokinesis by echocardiogram.  He was given IV Lasix.  I saw him in follow-up 08/11/17 and arranged a nuclear stress test.  This demonstrated inferior scar but no ischemia.  I reviewed this with Dr. Acie Fredrickson.  We felt the patient could be continued on medical management unless he had anginal symptoms or worsening shortness of breath.  [name] returns for close follow-up.  He is here with his wife.  Since last seen, he denies any chest discomfort.  He denies any significant shortness of breath.  He denies syncope.  He denies paroxysmal nocturnal dyspnea.  He has noted worsening leg swelling.  He has not seen a significant weight gain.  Unfortunately, he developed what is believed to be shingles over his left ear and temple area.  He is on valacyclovir and has also been seen by ENT.  He sees his urologist later this month.  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 Atrial fibrillation ([place]) 09/16/2010  . Chronic systolic heart failure (HCC)    Echo 1/19: Mild LVH, EF 45-50, inf HK, MAC, severe LAE, severe RAE // Echo 7/15: Mild LVH, mod focal basal sept hypertrophy, EF 55-60, AV peak and mean 16/9, trivial MR, mod LAE, PASP 38  . CLL (chronic lymphocytic leukemia) ([place])   . Diabetes mellitus type 2, noninsulin dependent ([place])   . 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 History of myocardial infarction    Nuclear stress test 2/19: Large inferior infarct, no ischemia  . History of nuclear stress test    Nuclear stress test 12/0 7: No ischemia, EF 51  . Hyperlipidemia   . Hypertension   . Ischemic cardiomyopathy 09/01/2017   Presumed +CAD with Nuclear stress test 08/18/17 - Inferior scar, no ischemia, intermediate risk // med management unless +angina or worse dyspnea  .  Pacemaker 02/08/2014   DR Lovena Le  . prostate ca dx'd 2006   xrt; prostatectomy  . Sick sinus syndrome [facility])    a-Flutter with episodes of bradycardia; S/P Biotronik (serial number [id number])    Past Surgical History:  Procedure Laterality Date  . APPENDECTOMY    . BACK SURGERY     disk  . INSERT / REPLACE / REMOVE PACEMAKER  02/08/2014    DR Lovena Le  . PACEMAKER INSERTION  02/08/2014   Biotronik (serial number [id number])  . PERMANENT PACEMAKER INSERTION N/A 02/08/2014   Procedure: PERMANENT  PACEMAKER INSERTION;  Surgeon: Evans Lance, MD;  Location: [facility] CATH LAB;  Service: Cardiovascular;  Laterality: N/A;  . TONSILLECTOMY    . TOTAL HIP ARTHROPLASTY Left 05/12/2016   Procedure: LEFT TOTAL HIP ARTHROPLASTY ANTERIOR APPROACH;  Surgeon: Paralee Cancel, MD;  Location: [facility] ORS;  Service: Orthopedics;  Laterality: Left;  . TOTAL KNEE ARTHROPLASTY      Current Medications: Current Meds  Medication Sig  . acetaminophen (TYLENOL) 500 MG tablet Take 1,000 mg by mouth as needed for mild pain.  [name] atorvastatin (LIPITOR) 20 MG tablet TAKE 1 TABLET BY MOUTH DAILY AT 6PM  . carvedilol (COREG) 3.125 MG tablet Take 1 tablet (3.125 mg total) by mouth 2 (two) times daily.  . fenofibrate micronized (LOFIBRA) 200 MG capsule TAKE 1 CAPSULE BY MOUTH DAILY BEFORE BREAKFAST  . gabapentin (NEURONTIN) 100 MG capsule Take 100 mg by mouth daily as needed (FOR PAIN).   [name] glipiZIDE (GLUCOTROL XL) 2.5 MG 24 hr tablet Take 2.5 mg by mouth as needed (FOR DIABETES).   [name] JANUVIA 100 MG tablet Take 100 mg by mouth at bedtime.   . metFORMIN (GLUCOPHAGE) 1000 MG tablet Take 1,000 mg by mouth 2 (two) times daily with a meal.    . multivitamin (THERAGRAN) per tablet Take 1 tablet by mouth daily. Will stop prior to procedure  . valACYclovir (VALTREX) 500 MG tablet Take 500 mg by mouth 3 (three) times daily.   . vitamin B-12 (CYANOCOBALAMIN) 1000 MCG tablet Take 1,000 mcg by mouth daily. Will stop prior to procedure  . warfarin (COUMADIN) 5 MG tablet TAKE AS DIRECTED BY COUMADIN CLINIC  . [DISCONTINUED] aspirin 81 MG tablet Take 81 mg by mouth at bedtime. Will stop prior to surgery     Allergies:   Altace [ramipril]; Codeine; and Simvastatin   Social History   Tobacco Use  . Smoking status: Former Smoker    Years: 25.00    Types: Pipe, Cigars    Last attempt to quit: 11/25/1975    Years since quitting: 41.7  . Smokeless tobacco: Never Used  Substance Use Topics  . Alcohol use: Yes    Alcohol/week: 0.0 oz     Comment: occasional  . Drug use: No     Family Hx: The patient's family history includes Allergic rhinitis in his brother and mother; Emphysema in his mother; Heart attack in his father; Pulmonary fibrosis in his brother.  ROS:   Please see the history of present illness.    ROS All other systems reviewed and are negative.   EKGs/Labs/Other Test Reviewed:    EKG:  EKG is  ordered today.  The ekg ordered today demonstrates probable sinus bradycardia with complete heart block and junctional or ventricular escape with right bundle branch block morphology, heart rate 61  Recent Labs: 07/19/2017: ALT 19; B Natriuretic Peptide 727.6 07/22/2017: Magnesium 1.8; TSH 0.436 07/23/2017: BUN 23; Creatinine, Ser 1.10; Hemoglobin 9.0; Platelets 511;  Potassium 4.3; Sodium 128   Recent Lipid Panel Lab Results  Component Value Date/Time   CHOL 145 05/28/2015 10:59 AM   TRIG 107 05/28/2015 10:59 AM   HDL 23 (L) 05/28/2015 10:59 AM   CHOLHDL 6.3 (H) 05/28/2015 10:59 AM   LDLCALC 101 05/28/2015 10:59 AM   LDLDIRECT 133.8 07/13/2012 09:19 AM    Physical Exam:    VS:  BP (!) 116/48   Pulse 61   Ht _0  (1.778 m)   Wt 200 lb (90.7 kg)   SpO2 90%   BMI 28.70 kg/m     Wt Readings from Last 3 Encounters:  09/01/17 200 lb (90.7 kg)  08/18/17 195 lb (88.5 kg)  08/11/17 195 lb (88.5 kg)     Physical Exam  Constitutional: He is oriented to person, place, and time. He appears well-developed and well-nourished. No distress.  HENT:  Head: Normocephalic.  Left ear erythematous and swollen  Neck: Neck supple.  Cardiovascular: Normal rate and regular rhythm.  No murmur heard. Pulmonary/Chest: Effort normal. He has no rales.  Abdominal: Soft.  Musculoskeletal: He exhibits edema (trace-1+ bilat LE edema).  Neurological: He is alert and oriented to person, place, and time.  Skin: Skin is warm and dry.    ASSESSMENT & PLAN:    #1.  Ischemic cardiomyopathy  Recent stress test demonstrates  inferior scar.  EF is 45-50 with inferior wall motion abnormality by echocardiogram January 2019.  He has likely had a myocardial infarction at some point in the past.  His stress test showed no ischemia.  He is currently not having any symptoms of angina.  I have reviewed his case previously with Dr. Acie Fredrickson.  As long as he remains free of angina and is not having worsening issues with shortness of breath, we can continue to treat him medically.  Continue beta-blocker, statin.  He has no indication for aspirin at this time.  -DC aspirin  #2.  Chronic systolic CHF (congestive heart failure) (HCC) EF 45-50 by echocardiogram.  He is NYHA 2.  He does have some increased lower extremity swelling as well as recent weight increase (by our scales).  His breathing is overall stable.  I will give him Lasix to use as needed.  He has  a questionable allergy to ACE inhibitor in the past.  At this point, I do not think his blood pressure would tolerate the addition of hydralazine and nitrates.  We could certainly consider this in the future.  -Lasix 20 mg daily as needed for weight gain or increased swelling  -Continue current dose of beta-blocker  -Consider hydralazine +/- nitrates in the future  #3.  Permanent atrial fibrillation (HCC) He appears to be in sinus rhythm with complete heart block today.  Continue warfarin.  This is managed in our Coumadin clinic.  #4.  Preoperative cardiovascular examination As noted, I have reviewed his case with Dr. Acie Fredrickson previously.  As he is not having anginal symptoms, he does not require any further testing.  He may proceed with his urologic procedure at acceptable risk.  #5.  Cardiac pacemaker in situ Recent pacemaker interrogation in January 2019.  Continue follow-up with EP as planned.   Dispo:  Return in about 3 months (around 12/02/2017) for Routine Follow Up, w/ Dr. Acie Fredrickson.   Medication Adjustments/Labs and Tests Ordered: Current medicines are reviewed at length  with the patient today.  Concerns regarding medicines are outlined above.  Tests Ordered: Orders Placed This Encounter  Procedures  .  EKG 12-Lead   Medication Changes: Meds ordered this encounter  Medications  . furosemide (LASIX) 20 MG tablet    Sig: Take 1 tablet (20 mg total) by mouth daily as needed.    Dispense:  90 tablet    Refill:  0    DOSE CHANGE    Signed, Richardson Dopp, PA-C  09/01/2017 11:38 AM    [facility] [address], [place], [place]  [address] Phone: [phone]; Fax: [phone]

## 2017-09-06 DIAGNOSIS — L049 Acute lymphadenitis, unspecified: Secondary | ICD-10-CM | POA: Insufficient documentation

## 2017-09-06 DIAGNOSIS — Z01118 Encounter for examination of ears and hearing with other abnormal findings: Secondary | ICD-10-CM | POA: Diagnosis not present

## 2017-09-06 DIAGNOSIS — L03818 Cellulitis of other sites: Secondary | ICD-10-CM | POA: Diagnosis not present

## 2017-09-06 DIAGNOSIS — L0390 Cellulitis, unspecified: Secondary | ICD-10-CM | POA: Insufficient documentation

## 2017-09-06 DIAGNOSIS — R94128 Abnormal results of other function studies of ear and other special senses: Secondary | ICD-10-CM | POA: Insufficient documentation

## 2017-09-07 DIAGNOSIS — Z87891 Personal history of nicotine dependence: Secondary | ICD-10-CM | POA: Diagnosis not present

## 2017-09-07 DIAGNOSIS — N184 Chronic kidney disease, stage 4 (severe): Secondary | ICD-10-CM | POA: Diagnosis not present

## 2017-09-07 DIAGNOSIS — C61 Malignant neoplasm of prostate: Secondary | ICD-10-CM | POA: Diagnosis not present

## 2017-09-07 DIAGNOSIS — I2510 Atherosclerotic heart disease of native coronary artery without angina pectoris: Secondary | ICD-10-CM | POA: Diagnosis not present

## 2017-09-07 DIAGNOSIS — C9110 Chronic lymphocytic leukemia of B-cell type not having achieved remission: Secondary | ICD-10-CM | POA: Diagnosis not present

## 2017-09-07 DIAGNOSIS — M1990 Unspecified osteoarthritis, unspecified site: Secondary | ICD-10-CM | POA: Diagnosis not present

## 2017-09-07 DIAGNOSIS — E1122 Type 2 diabetes mellitus with diabetic chronic kidney disease: Secondary | ICD-10-CM | POA: Diagnosis not present

## 2017-09-07 DIAGNOSIS — Z7901 Long term (current) use of anticoagulants: Secondary | ICD-10-CM | POA: Diagnosis not present

## 2017-09-07 DIAGNOSIS — I5022 Chronic systolic (congestive) heart failure: Secondary | ICD-10-CM | POA: Diagnosis not present

## 2017-09-07 DIAGNOSIS — Z950 Presence of cardiac pacemaker: Secondary | ICD-10-CM | POA: Diagnosis not present

## 2017-09-07 DIAGNOSIS — E7849 Other hyperlipidemia: Secondary | ICD-10-CM | POA: Diagnosis not present

## 2017-09-07 DIAGNOSIS — R2681 Unsteadiness on feet: Secondary | ICD-10-CM | POA: Diagnosis not present

## 2017-09-07 DIAGNOSIS — E785 Hyperlipidemia, unspecified: Secondary | ICD-10-CM | POA: Diagnosis not present

## 2017-09-07 DIAGNOSIS — I130 Hypertensive heart and chronic kidney disease with heart failure and stage 1 through stage 4 chronic kidney disease, or unspecified chronic kidney disease: Secondary | ICD-10-CM | POA: Diagnosis not present

## 2017-09-07 DIAGNOSIS — Z7984 Long term (current) use of oral hypoglycemic drugs: Secondary | ICD-10-CM | POA: Diagnosis not present

## 2017-09-10 DIAGNOSIS — C9110 Chronic lymphocytic leukemia of B-cell type not having achieved remission: Secondary | ICD-10-CM | POA: Diagnosis not present

## 2017-09-10 DIAGNOSIS — I2510 Atherosclerotic heart disease of native coronary artery without angina pectoris: Secondary | ICD-10-CM | POA: Diagnosis not present

## 2017-09-10 DIAGNOSIS — E1122 Type 2 diabetes mellitus with diabetic chronic kidney disease: Secondary | ICD-10-CM | POA: Diagnosis not present

## 2017-09-10 DIAGNOSIS — R2681 Unsteadiness on feet: Secondary | ICD-10-CM | POA: Diagnosis not present

## 2017-09-10 DIAGNOSIS — I5022 Chronic systolic (congestive) heart failure: Secondary | ICD-10-CM | POA: Diagnosis not present

## 2017-09-10 DIAGNOSIS — I130 Hypertensive heart and chronic kidney disease with heart failure and stage 1 through stage 4 chronic kidney disease, or unspecified chronic kidney disease: Secondary | ICD-10-CM | POA: Diagnosis not present

## 2017-09-13 DIAGNOSIS — I2510 Atherosclerotic heart disease of native coronary artery without angina pectoris: Secondary | ICD-10-CM | POA: Diagnosis not present

## 2017-09-13 DIAGNOSIS — I5022 Chronic systolic (congestive) heart failure: Secondary | ICD-10-CM | POA: Diagnosis not present

## 2017-09-13 DIAGNOSIS — C9110 Chronic lymphocytic leukemia of B-cell type not having achieved remission: Secondary | ICD-10-CM | POA: Diagnosis not present

## 2017-09-13 DIAGNOSIS — R2681 Unsteadiness on feet: Secondary | ICD-10-CM | POA: Diagnosis not present

## 2017-09-13 DIAGNOSIS — E1122 Type 2 diabetes mellitus with diabetic chronic kidney disease: Secondary | ICD-10-CM | POA: Diagnosis not present

## 2017-09-13 DIAGNOSIS — I130 Hypertensive heart and chronic kidney disease with heart failure and stage 1 through stage 4 chronic kidney disease, or unspecified chronic kidney disease: Secondary | ICD-10-CM | POA: Diagnosis not present

## 2017-09-14 ENCOUNTER — Ambulatory Visit (INDEPENDENT_AMBULATORY_CARE_PROVIDER_SITE_OTHER): Payer: Medicare Other | Admitting: *Deleted

## 2017-09-14 DIAGNOSIS — Z5181 Encounter for therapeutic drug level monitoring: Secondary | ICD-10-CM

## 2017-09-14 DIAGNOSIS — H6521 Chronic serous otitis media, right ear: Secondary | ICD-10-CM | POA: Diagnosis not present

## 2017-09-14 DIAGNOSIS — I482 Chronic atrial fibrillation: Secondary | ICD-10-CM

## 2017-09-14 DIAGNOSIS — I4821 Permanent atrial fibrillation: Secondary | ICD-10-CM

## 2017-09-14 DIAGNOSIS — H90A31 Mixed conductive and sensorineural hearing loss, unilateral, right ear with restricted hearing on the contralateral side: Secondary | ICD-10-CM | POA: Insufficient documentation

## 2017-09-14 DIAGNOSIS — B029 Zoster without complications: Secondary | ICD-10-CM | POA: Diagnosis not present

## 2017-09-14 LAB — POCT INR: INR: 1.9

## 2017-09-14 NOTE — Patient Instructions (Signed)
Description   Today March 26th take 1 and 1/2 tablets then continue 1 tablet every day except 1 and 1/2 tablets on Saturdays. Recheck INR in 2 weeks. Call Coumadin Clinic [phone] with any concerns Will finish Clindamycin on Friday

## 2017-09-15 ENCOUNTER — Encounter: Payer: Self-pay | Admitting: Podiatry

## 2017-09-15 ENCOUNTER — Ambulatory Visit (INDEPENDENT_AMBULATORY_CARE_PROVIDER_SITE_OTHER): Payer: Medicare Other | Admitting: Podiatry

## 2017-09-15 DIAGNOSIS — M79671 Pain in right foot: Secondary | ICD-10-CM

## 2017-09-15 DIAGNOSIS — L600 Ingrowing nail: Secondary | ICD-10-CM

## 2017-09-15 DIAGNOSIS — M79672 Pain in left foot: Secondary | ICD-10-CM

## 2017-09-15 DIAGNOSIS — I130 Hypertensive heart and chronic kidney disease with heart failure and stage 1 through stage 4 chronic kidney disease, or unspecified chronic kidney disease: Secondary | ICD-10-CM | POA: Diagnosis not present

## 2017-09-15 DIAGNOSIS — I5022 Chronic systolic (congestive) heart failure: Secondary | ICD-10-CM | POA: Diagnosis not present

## 2017-09-15 DIAGNOSIS — E1122 Type 2 diabetes mellitus with diabetic chronic kidney disease: Secondary | ICD-10-CM | POA: Diagnosis not present

## 2017-09-15 DIAGNOSIS — I2510 Atherosclerotic heart disease of native coronary artery without angina pectoris: Secondary | ICD-10-CM | POA: Diagnosis not present

## 2017-09-15 DIAGNOSIS — R2681 Unsteadiness on feet: Secondary | ICD-10-CM | POA: Diagnosis not present

## 2017-09-15 DIAGNOSIS — B351 Tinea unguium: Secondary | ICD-10-CM

## 2017-09-15 DIAGNOSIS — C9110 Chronic lymphocytic leukemia of B-cell type not having achieved remission: Secondary | ICD-10-CM | POA: Diagnosis not present

## 2017-09-15 NOTE — Progress Notes (Signed)
Subjective: 84 y.o. year old male patient presents using walker complaining of painful nails. Patient requests toe nails trimmed. He missed last month appointment. Been in hospital with ear infection that caused him with balance problem last month. He lost hearing on right side due to the infection.  Objective: Dermatologic: Thick yellow deformed nails x 10. Ingrown right great toe nail without infection. Vascular: Pedal pulses are all palpable. Orthopedic: No gross deformities. Neurologic: All epicritic and tactile sensations grossly intact.  Assessment: Dystrophic mycotic nails x 10. Ingrown right great toe nail. Painful toes.  Treatment: All mycotic nails debrided.  Bleeding right great toe lateral border cauterized with Silver Nitrate stick. No further dressing needed. Return in 3 months or as needed.

## 2017-09-15 NOTE — Patient Instructions (Signed)
Seen for hypertrophic nails. All nails debrided. Return in 3 months or as needed.  

## 2017-09-17 DIAGNOSIS — N3942 Incontinence without sensory awareness: Secondary | ICD-10-CM | POA: Diagnosis not present

## 2017-09-17 DIAGNOSIS — R509 Fever, unspecified: Secondary | ICD-10-CM | POA: Diagnosis not present

## 2017-09-17 DIAGNOSIS — J111 Influenza due to unidentified influenza virus with other respiratory manifestations: Secondary | ICD-10-CM | POA: Diagnosis not present

## 2017-09-17 DIAGNOSIS — E119 Type 2 diabetes mellitus without complications: Secondary | ICD-10-CM | POA: Diagnosis not present

## 2017-09-17 DIAGNOSIS — Z6829 Body mass index (BMI) 29.0-29.9, adult: Secondary | ICD-10-CM | POA: Diagnosis not present

## 2017-09-17 DIAGNOSIS — N5231 Erectile dysfunction following radical prostatectomy: Secondary | ICD-10-CM | POA: Diagnosis not present

## 2017-09-17 DIAGNOSIS — R310 Gross hematuria: Secondary | ICD-10-CM | POA: Diagnosis not present

## 2017-09-17 DIAGNOSIS — R05 Cough: Secondary | ICD-10-CM | POA: Diagnosis not present

## 2017-09-17 DIAGNOSIS — C61 Malignant neoplasm of prostate: Secondary | ICD-10-CM | POA: Diagnosis not present

## 2017-09-17 DIAGNOSIS — J181 Lobar pneumonia, unspecified organism: Secondary | ICD-10-CM | POA: Diagnosis not present

## 2017-09-20 ENCOUNTER — Ambulatory Visit (INDEPENDENT_AMBULATORY_CARE_PROVIDER_SITE_OTHER): Payer: Medicare Other | Admitting: *Deleted

## 2017-09-20 DIAGNOSIS — E1122 Type 2 diabetes mellitus with diabetic chronic kidney disease: Secondary | ICD-10-CM | POA: Diagnosis not present

## 2017-09-20 DIAGNOSIS — I2510 Atherosclerotic heart disease of native coronary artery without angina pectoris: Secondary | ICD-10-CM | POA: Diagnosis not present

## 2017-09-20 DIAGNOSIS — R2681 Unsteadiness on feet: Secondary | ICD-10-CM | POA: Diagnosis not present

## 2017-09-20 DIAGNOSIS — I482 Chronic atrial fibrillation: Secondary | ICD-10-CM

## 2017-09-20 DIAGNOSIS — I4821 Permanent atrial fibrillation: Secondary | ICD-10-CM

## 2017-09-20 DIAGNOSIS — I130 Hypertensive heart and chronic kidney disease with heart failure and stage 1 through stage 4 chronic kidney disease, or unspecified chronic kidney disease: Secondary | ICD-10-CM | POA: Diagnosis not present

## 2017-09-20 DIAGNOSIS — Z5181 Encounter for therapeutic drug level monitoring: Secondary | ICD-10-CM | POA: Diagnosis not present

## 2017-09-20 DIAGNOSIS — C9110 Chronic lymphocytic leukemia of B-cell type not having achieved remission: Secondary | ICD-10-CM | POA: Diagnosis not present

## 2017-09-20 DIAGNOSIS — I5022 Chronic systolic (congestive) heart failure: Secondary | ICD-10-CM | POA: Diagnosis not present

## 2017-09-20 LAB — POCT INR: INR: 2.6

## 2017-09-20 NOTE — Patient Instructions (Signed)
Description   Tomorrow take 1/2 tablet then continue taking 1 tablet every day except 1 and 1/2 tablets on Saturdays. Recheck INR in 1 week. Call Coumadin Clinic [phone] with any concerns

## 2017-09-22 DIAGNOSIS — I5022 Chronic systolic (congestive) heart failure: Secondary | ICD-10-CM | POA: Diagnosis not present

## 2017-09-22 DIAGNOSIS — C9110 Chronic lymphocytic leukemia of B-cell type not having achieved remission: Secondary | ICD-10-CM | POA: Diagnosis not present

## 2017-09-22 DIAGNOSIS — I130 Hypertensive heart and chronic kidney disease with heart failure and stage 1 through stage 4 chronic kidney disease, or unspecified chronic kidney disease: Secondary | ICD-10-CM | POA: Diagnosis not present

## 2017-09-22 DIAGNOSIS — E1122 Type 2 diabetes mellitus with diabetic chronic kidney disease: Secondary | ICD-10-CM | POA: Diagnosis not present

## 2017-09-22 DIAGNOSIS — R2681 Unsteadiness on feet: Secondary | ICD-10-CM | POA: Diagnosis not present

## 2017-09-22 DIAGNOSIS — I2510 Atherosclerotic heart disease of native coronary artery without angina pectoris: Secondary | ICD-10-CM | POA: Diagnosis not present

## 2017-09-28 ENCOUNTER — Ambulatory Visit (INDEPENDENT_AMBULATORY_CARE_PROVIDER_SITE_OTHER): Payer: Medicare Other | Admitting: *Deleted

## 2017-09-28 DIAGNOSIS — C9110 Chronic lymphocytic leukemia of B-cell type not having achieved remission: Secondary | ICD-10-CM | POA: Diagnosis not present

## 2017-09-28 DIAGNOSIS — R2681 Unsteadiness on feet: Secondary | ICD-10-CM | POA: Diagnosis not present

## 2017-09-28 DIAGNOSIS — I2510 Atherosclerotic heart disease of native coronary artery without angina pectoris: Secondary | ICD-10-CM | POA: Diagnosis not present

## 2017-09-28 DIAGNOSIS — Z5181 Encounter for therapeutic drug level monitoring: Secondary | ICD-10-CM

## 2017-09-28 DIAGNOSIS — I4821 Permanent atrial fibrillation: Secondary | ICD-10-CM

## 2017-09-28 DIAGNOSIS — I130 Hypertensive heart and chronic kidney disease with heart failure and stage 1 through stage 4 chronic kidney disease, or unspecified chronic kidney disease: Secondary | ICD-10-CM | POA: Diagnosis not present

## 2017-09-28 DIAGNOSIS — E1122 Type 2 diabetes mellitus with diabetic chronic kidney disease: Secondary | ICD-10-CM | POA: Diagnosis not present

## 2017-09-28 DIAGNOSIS — I5022 Chronic systolic (congestive) heart failure: Secondary | ICD-10-CM | POA: Diagnosis not present

## 2017-09-28 DIAGNOSIS — I482 Chronic atrial fibrillation: Secondary | ICD-10-CM | POA: Diagnosis not present

## 2017-09-28 LAB — POCT INR: INR: 1.8

## 2017-09-28 NOTE — Patient Instructions (Signed)
Description   Tonight take 1.5 tablets, then continue taking 1 tablet every day except 1.5  tablets on Saturdays. Recheck INR in 2 weeks.  Call Coumadin Clinic [phone] with any concerns

## 2017-09-29 DIAGNOSIS — I5022 Chronic systolic (congestive) heart failure: Secondary | ICD-10-CM | POA: Diagnosis not present

## 2017-09-29 DIAGNOSIS — E1122 Type 2 diabetes mellitus with diabetic chronic kidney disease: Secondary | ICD-10-CM | POA: Diagnosis not present

## 2017-09-29 DIAGNOSIS — R2681 Unsteadiness on feet: Secondary | ICD-10-CM | POA: Diagnosis not present

## 2017-09-29 DIAGNOSIS — I130 Hypertensive heart and chronic kidney disease with heart failure and stage 1 through stage 4 chronic kidney disease, or unspecified chronic kidney disease: Secondary | ICD-10-CM | POA: Diagnosis not present

## 2017-09-29 DIAGNOSIS — C9110 Chronic lymphocytic leukemia of B-cell type not having achieved remission: Secondary | ICD-10-CM | POA: Diagnosis not present

## 2017-09-29 DIAGNOSIS — I2510 Atherosclerotic heart disease of native coronary artery without angina pectoris: Secondary | ICD-10-CM | POA: Diagnosis not present

## 2017-10-11 ENCOUNTER — Ambulatory Visit (INDEPENDENT_AMBULATORY_CARE_PROVIDER_SITE_OTHER): Payer: Medicare Other | Admitting: *Deleted

## 2017-10-11 DIAGNOSIS — I495 Sick sinus syndrome: Secondary | ICD-10-CM | POA: Diagnosis not present

## 2017-10-11 NOTE — Progress Notes (Signed)
Remote pacemaker transmission.   

## 2017-10-12 ENCOUNTER — Encounter: Payer: Self-pay | Admitting: Cardiology

## 2017-10-12 ENCOUNTER — Ambulatory Visit (INDEPENDENT_AMBULATORY_CARE_PROVIDER_SITE_OTHER): Payer: Medicare Other | Admitting: *Deleted

## 2017-10-12 DIAGNOSIS — Z5181 Encounter for therapeutic drug level monitoring: Secondary | ICD-10-CM

## 2017-10-12 DIAGNOSIS — I482 Chronic atrial fibrillation: Secondary | ICD-10-CM

## 2017-10-12 DIAGNOSIS — I4821 Permanent atrial fibrillation: Secondary | ICD-10-CM

## 2017-10-12 LAB — POCT INR: INR: 2.2

## 2017-10-12 NOTE — Patient Instructions (Signed)
Description   Continue taking 1 tablet every day except 1.5  tablets on Saturdays. Recheck INR in 3 weeks.  Call Coumadin Clinic [phone] with any concerns

## 2017-10-14 LAB — CUP PACEART REMOTE DEVICE CHECK
Date Time Interrogation Session: 20190425091702
MDC IDC LEAD IMPLANT DT: 20150820
MDC IDC LEAD LOCATION: 753860
MDC IDC LEAD SERIAL: 29625633
MDC IDC PG IMPLANT DT: 20150820
Pulse Gen Model: 394934
Pulse Gen Serial Number: 68372291

## 2017-10-19 DIAGNOSIS — R062 Wheezing: Secondary | ICD-10-CM | POA: Diagnosis not present

## 2017-10-19 DIAGNOSIS — R05 Cough: Secondary | ICD-10-CM | POA: Diagnosis not present

## 2017-10-20 ENCOUNTER — Other Ambulatory Visit: Payer: Self-pay

## 2017-10-20 ENCOUNTER — Ambulatory Visit: Payer: Medicare Other | Attending: Internal Medicine | Admitting: Physical Therapy

## 2017-10-20 ENCOUNTER — Encounter: Payer: Self-pay | Admitting: Physical Therapy

## 2017-10-20 DIAGNOSIS — R296 Repeated falls: Secondary | ICD-10-CM | POA: Insufficient documentation

## 2017-10-20 DIAGNOSIS — R2689 Other abnormalities of gait and mobility: Secondary | ICD-10-CM | POA: Diagnosis not present

## 2017-10-20 DIAGNOSIS — M6281 Muscle weakness (generalized): Secondary | ICD-10-CM | POA: Diagnosis not present

## 2017-10-20 DIAGNOSIS — R2681 Unsteadiness on feet: Secondary | ICD-10-CM | POA: Insufficient documentation

## 2017-10-21 NOTE — Therapy (Signed)
[place] [address] [place], [name], [address] Phone: [phone]   Fax:  [phone]  Physical Therapy Evaluation  Patient Details  Name: Oscar Baker MRN: [id number] Date of Birth: 1932/11/30 Referring Provider: [name], MD   Encounter Date: 10/20/2017  PT End of Session - 10/20/17 1559    Visit Number  1    Number of Visits  25    Date for PT Re-Evaluation  01/18/18    Authorization Type  Medicare & BCBS supplement    Authorization Time Period  medicare guidelines with medical necessity; 100% coverage    PT Start Time  0840    PT Stop Time  0927    PT Time Calculation (min)  47 min    Equipment Utilized During Treatment  Gait belt    Activity Tolerance  Patient tolerated treatment well    Behavior During Therapy  [facility] for tasks assessed/performed       Past Medical History:  Diagnosis Date  . Atrial fibrillation ([place]) 09/16/2010  . Chronic systolic heart failure (HCC)    Echo 1/19: Mild LVH, EF 45-50, inf HK, MAC, severe LAE, severe RAE // Echo 7/15: Mild LVH, mod focal basal sept hypertrophy, EF 55-60, AV peak and mean 16/9, trivial MR, mod LAE, PASP 38  . CLL (chronic lymphocytic leukemia) ([place])   . Diabetes mellitus type 2, noninsulin dependent ([place])   . 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 History of myocardial infarction    Nuclear stress test 2/19: Large inferior infarct, no ischemia  . History of nuclear stress test    Nuclear stress test 12/0 7: No ischemia, EF 51  . Hyperlipidemia   . Hypertension   . Ischemic cardiomyopathy 09/01/2017   Presumed +CAD with Nuclear stress test 08/18/17 - Inferior scar, no ischemia, intermediate risk // med management unless +angina or worse dyspnea  . Pacemaker 02/08/2014   DR Lovena Le  . prostate ca dx'd 2006   xrt;  prostatectomy  . Sick sinus syndrome [facility])    a-Flutter with episodes of bradycardia; S/P Biotronik (serial number [id number])    Past Surgical History:  Procedure Laterality Date  . APPENDECTOMY    . BACK SURGERY     disk  . INSERT / REPLACE / REMOVE PACEMAKER  02/08/2014    DR Lovena Le  . PACEMAKER INSERTION  02/08/2014   Biotronik (serial number [id number])  . PERMANENT PACEMAKER INSERTION N/A 02/08/2014   Procedure: PERMANENT PACEMAKER INSERTION;  Surgeon: Evans Lance, MD;  Location: [facility] C[facility] LAB;  Service: Cardiovascular;  Laterality: N/A;  . TONSILLECTOMY    . TOTAL HIP ARTHROPLASTY Left 05/12/2016   Procedure: LEFT TOTAL HIP ARTHROPLASTY ANTERIOR APPROACH;  Surgeon: Paralee Cancel, MD;  Location: [facility] ORS;  Service: Orthopedics;  Laterality: Left;  . TOTAL KNEE ARTHROPLASTY      There were no vitals filed for this visit.   Subjective Assessment - 10/20/17 0851    Subjective  This 84yo male was referred on 10/06/2017 by [name], MD for PT evaluation with gait abnormality. He fell 07/19/2017 with laceration to head with hospitalization 07/19/17 -07/23/17. Bad ear infection which resulted in hearing loss to right ear & also limited mobility. SNF at [facility] for 19 days. Home Health PT thru 09/29/17.     Patient is accompained by:  Family member wife, Oscar Baker  Pertinent History  Left THA 05/12/16, right THA 2008, CHF, MI, DM2, HTN, OA, prostate CA, shingles, A-fib, hearing loss, knee sg,     Limitations  Lifting;Standing;House hold activities;Walking    Patient Stated Goals  to get off walker, be able to go out community    Currently in Pain?  No/denies         [facility] PT Assessment - 10/20/17 0845      Assessment   Medical Diagnosis  Gait Abnormality    Referring Provider  [name], MD    Onset Date/Surgical Date  10/06/17 PT referral    Hand Dominance  Right    Prior Therapy  HHPT      Precautions   Precautions  Fall      Balance Screen   Has the patient fallen in the  past 6 months  Yes    How many times?  2 trip,     Has the patient had a decrease in activity level because of a fear of falling?   No    Is the patient reluctant to leave their home because of a fear of falling?   No      Home Film/video editor residence    Living Arrangements  Spouse/significant other    Type of [place] 3rd floor    [place]  One level    [place] - 2 wheels;Cane - single point;Grab bars - tub/shower;Toilet riser      Prior Function   Level of Independence  Independent;Independent with household mobility without device;Independent with community mobility without device    Vocation  Retired      Art therapist   Posture/Postural Control  Postural limitations    Postural Limitations  Rounded Shoulders;Forward head;Flexed trunk      Transfers   Transfers  Sit to Stand;Stand to Sit    Sit to Stand  5: Supervision;With upper extremity assist;With armrests;From chair/3-in-1    Stand to Sit  5: Supervision;With upper extremity assist;With armrests;To chair/3-in-1      Ambulation/Gait   Ambulation/Gait  Yes    Ambulation/Gait Assistance  3: Mod assist;5: Supervision supervision RW & ModA cane    Ambulation/Gait Assistance Details  excessive UE weight bearing on RW    Ambulation Distance (Feet)  100 Feet    Assistive device  Rolling walker;Straight cane    Gait Pattern  Step-through pattern;Decreased arm swing - left;Decreased step length - left;Decreased stance time - right;Decreased stride length;Lateral hip instability;Trunk flexed;Wide base of support    Ambulation Surface  Indoor;Level    Gait velocity  1.70 ft/sec cane & 2.31 ft/sec RW      Standardized Balance Assessment   Standardized Balance Assessment  Berg Balance Test;Timed Up and Go Test      Berg Balance Test   Sit to Stand  Able to stand  independently using hands    Standing Unsupported  Able to stand 2 minutes with  supervision    Sitting with Back Unsupported but Feet Supported on Floor or Stool  Able to sit safely and securely 2 minutes    Stand to Sit  Controls descent by using hands    Transfers  Able to transfer safely, definite need of hands    Standing Unsupported with Eyes Closed  Able to stand 10 seconds with supervision    Standing Ubsupported with Feet Together  Needs help to attain position  but able to stand for 30 seconds with feet together    From Standing, Reach Forward with Outstretched Arm  Reaches forward but needs supervision    From Standing Position, Pick up Object from [place] to pick up shoe, needs supervision    From Standing Position, Turn to Look Behind Over each Shoulder  Turn sideways only but maintains balance    Turn 360 Degrees  Needs assistance while turning    Standing Unsupported, Alternately Place Feet on Step/Stool  Needs assistance to keep from falling or unable to try    Standing Unsupported, One Foot in [employer] balance while stepping or standing    Standing on One Leg  Unable to try or needs assist to prevent fall    Total Score  26      Timed Up and Go Test   Normal TUG (seconds)  18.1 18.10sec cane with modA, 13.71sec RW supervision                Objective measurements completed on examination: See above findings.                PT Short Term Goals - 10/21/17 1248      PT SHORT TERM GOAL #1   Title  Patient demonstrates understanding of initial HEP. (All STGs Target Date: 11/19/2017)    Time  1    Period  Months    Status  New    Target Date  11/19/17      PT SHORT TERM GOAL #2   Title  Patient reaches 5" anteriorly and looks over shoulders with trunk rotation without UE support with supervision.     Time  1    Period  Months    Status  New    Target Date  11/19/17      PT SHORT TERM GOAL #3   Title  Patient ambulates 300' with cane with minA scanning environment & negotiating around obstacles.     Time  1    Period   Months    Status  New    Target Date  11/19/17      PT SHORT TERM GOAL #4   Title  Patient negotiates ramps, curbs and stairs with 1 rail with cane with minA.    Time  1    Period  Months    Status  New    Target Date  11/19/17        PT Long Term Goals - 10/21/17 1242      PT LONG TERM GOAL #1   Title  Patient verbalizes & demonstrates ongoing HEP / fitness plan. (All LTGs Target Date: 01/14/2018)    Time  12    Period  Weeks    Status  New    Target Date  01/14/18      PT LONG TERM GOAL #2   Title  Berg Balance >45/56 to indicate lower fall risk.     Time  12    Period  Weeks    Status  New    Target Date  01/14/18      PT LONG TERM GOAL #3   Title  Timed Up-Go with cane or less <13.5sec independently.     Time  12    Period  Weeks    Status  New    Target Date  01/14/18      PT LONG TERM GOAL #4   Title  Patient ambulates 500' outdoors including  grass with cane or less modified independent.     Time  12    Period  Weeks    Status  New    Target Date  01/14/18      PT LONG TERM GOAL #5   Title  Patient negotiates ramps, curbs with cane or less and stairs with single rail modified independent.     Time  12    Period  Weeks    Status  New    Target Date  01/14/18             Plan - 10/21/17 1227    Clinical Impression Statement  This 84yo male had recent fall with head laceration and bad ear infection with resulting hearing loss. He had decline in mobility which has resulted in deconditioning & weakness. Berg Balance Test score of 26/56 indicates high fall risk & dependency in standing ADLs. Timed Up-Go 13.71sec RW and 18.10sec with cane with modA both indicating fall risk.  Patient ambulates with RW with excessive UE weight bearing for support at 2.70f/sec. PT assessed gait with cane which requires modA for turns & scanning with gait velocity of 1.70 ft/sec. Patient would benefit from skilled PT to progress his safety, moblity and function.     History  and Personal Factors relevant to plan of care:  Left THA 05/12/16, right THA 2008, CHF, MI, DM2, HTN, OA, prostate CA, shingles, A-fib, hearing loss, knee sg    Clinical Presentation  Evolving    Clinical Presentation due to:  A-fib, OA with pain, high fall risk with recent history of falls    Clinical Decision Making  Moderate    Rehab Potential  Good    PT Frequency  2x / week    PT Duration  12 weeks    PT Treatment/Interventions  ADLs/Self Care Home Management;DME Instruction;Gait training;Stair training;Functional mobility training;Therapeutic exercise;Therapeutic activities;Balance training;Neuromuscular re-education;Patient/family education;Vestibular;Canalith Repostioning    PT Next Visit Plan  HEP for stength & balance    Consulted and Agree with Plan of Care  Patient;Family member/caregiver    Family Member Consulted  wife, JAleksandr Pellow      Patient will benefit from skilled therapeutic intervention in order to improve the following deficits and impairments:  Abnormal gait, Decreased activity tolerance, Decreased balance, Decreased endurance, Decreased coordination, Decreased knowledge of use of DME, Decreased mobility, Decreased strength, Postural dysfunction  Visit Diagnosis: Muscle weakness (generalized)  Other abnormalities of gait and mobility  Unsteadiness on feet  Repeated falls     Problem List Patient Active Problem List   Diagnosis Date Noted  . Ischemic cardiomyopathy 09/01/2017  . Right hip pain   . Acute otitis media   . Chronic systolic CHF (congestive heart failure) ([place]   . Pressure injury of skin 07/20/2017  . Malignant otitis externa 07/19/2017  . Fall at home, initial encounter 07/19/2017  . S/P left THA, AA 05/12/2016  . LPRD (laryngopharyngeal reflux disease) 05/21/2015  . Other allergic rhinitis 05/21/2015  . Obesity 12/12/2014  . Upper airway cough syndrome 12/11/2014  . Cough variant asthma 07/27/2014  . Wheezing 06/20/2014  .  Symptomatic bradycardia - s/p Biotronik (serial number [id number] 02/08/2014  . Diabetes mellitus type 2, noninsulin dependent ([place]   . Edema of foot 03/06/2013  . Traumatic ecchymosis of right foot 03/06/2013  . Bone spur 12/30/2012  . Pain of toe of left foot 12/30/2012  . Hyperlipidemia 07/13/2012  . HTN (hypertension) 04/12/2012  . [place] 08/17/2011  .  Permanent atrial fibrillation ([place]) 09/16/2010    Ilma Achee PT, DPT 10/21/2017, 12:53 PM  [place] [address] [place], [name], [address] Phone: [phone]   Fax:  [phone]  Name: Oscar Baker MRN: [id number] Date of Birth: 1933-05-17

## 2017-10-25 DIAGNOSIS — C61 Malignant neoplasm of prostate: Secondary | ICD-10-CM | POA: Diagnosis not present

## 2017-10-25 DIAGNOSIS — R310 Gross hematuria: Secondary | ICD-10-CM | POA: Diagnosis not present

## 2017-10-26 ENCOUNTER — Encounter: Payer: Self-pay | Admitting: Physical Therapy

## 2017-10-26 ENCOUNTER — Other Ambulatory Visit: Payer: Self-pay | Admitting: Urology

## 2017-10-26 ENCOUNTER — Ambulatory Visit: Payer: Medicare Other | Admitting: Physical Therapy

## 2017-10-26 DIAGNOSIS — R2681 Unsteadiness on feet: Secondary | ICD-10-CM | POA: Diagnosis not present

## 2017-10-26 DIAGNOSIS — R296 Repeated falls: Secondary | ICD-10-CM | POA: Diagnosis not present

## 2017-10-26 DIAGNOSIS — M6281 Muscle weakness (generalized): Secondary | ICD-10-CM | POA: Diagnosis not present

## 2017-10-26 DIAGNOSIS — R2689 Other abnormalities of gait and mobility: Secondary | ICD-10-CM

## 2017-10-26 NOTE — Therapy (Signed)
[place] [address] [place], [name], [address] Phone: [phone]   Fax:  [phone]  Physical Therapy Treatment  Patient Details  Name: Oscar Baker MRN: [id number] Date of Birth: 1932-07-27 Referring Provider: [name], MD   Encounter Date: 10/26/2017  PT End of Session - 10/26/17 0851    Visit Number  2    Number of Visits  25    Date for PT Re-Evaluation  01/18/18    Authorization Type  Medicare & BCBS supplement    Authorization Time Period  medicare guidelines with medical necessity; 100% coverage    PT Start Time  [phone]    PT Stop Time  0930    PT Time Calculation (min)  41 min    Equipment Utilized During Treatment  Gait belt    Activity Tolerance  Patient tolerated treatment well    Behavior During Therapy  [facility] for tasks assessed/performed       Past Medical History:  Diagnosis Date  . Atrial fibrillation ([place]) 09/16/2010  . Chronic systolic heart failure (HCC)    Echo 1/19: Mild LVH, EF 45-50, inf HK, MAC, severe LAE, severe RAE // Echo 7/15: Mild LVH, mod focal basal sept hypertrophy, EF 55-60, AV peak and mean 16/9, trivial MR, mod LAE, PASP 38  . CLL (chronic lymphocytic leukemia) ([place])   . Diabetes mellitus type 2, noninsulin dependent ([place])   . 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 History of myocardial infarction    Nuclear stress test 2/19: Large inferior infarct, no ischemia  . History of nuclear stress test    Nuclear stress test 12/0 7: No ischemia, EF 51  . Hyperlipidemia   . Hypertension   . Ischemic cardiomyopathy 09/01/2017   Presumed +CAD with Nuclear stress test 08/18/17 - Inferior scar, no ischemia, intermediate risk // med management unless +angina or worse dyspnea  . Pacemaker 02/08/2014   DR Lovena Le  . prostate ca dx'd 2006   xrt;  prostatectomy  . Sick sinus syndrome [facility])    a-Flutter with episodes of bradycardia; S/P Biotronik (serial number [id number])    Past Surgical History:  Procedure Laterality Date  . APPENDECTOMY    . BACK SURGERY     disk  . INSERT / REPLACE / REMOVE PACEMAKER  02/08/2014    DR Lovena Le  . PACEMAKER INSERTION  02/08/2014   Biotronik (serial number [id number])  . PERMANENT PACEMAKER INSERTION N/A 02/08/2014   Procedure: PERMANENT PACEMAKER INSERTION;  Surgeon: Evans Lance, MD;  Location: [facility] CATH LAB;  Service: Cardiovascular;  Laterality: N/A;  . TONSILLECTOMY    . TOTAL HIP ARTHROPLASTY Left 05/12/2016   Procedure: LEFT TOTAL HIP ARTHROPLASTY ANTERIOR APPROACH;  Surgeon: Paralee Cancel, MD;  [facility] ORS;  Service: Orthopedics;  Laterality: Left;  . TOTAL KNEE ARTHROPLASTY      There were no vitals filed for this visit.  Subjective Assessment - 10/26/17 0850    Subjective  No new complaints. No falls or pain to report.     Pertinent History  Left THA 05/12/16, right THA 2008, CHF, MI, DM2, HTN, OA, prostate CA, shingles, A-fib, hearing loss, knee sg,     Limitations  Lifting;Standing;House hold activities;Walking    Patient Stated Goals  to get off walker, be able to go out community    Currently in Pain?  No/denies  treatment: issued the following to pt's HEP today with min guard assist needed for balance. Cues on ex form and technique provided as well.    Sit to Stand / Stand to Sit / Transfers    Sit on edge of a solid chair with arms, feet flat on floor. Lean forward over feet and stand up with hands on chair arms. Sit down slowly with hands on chair arms. Repeat _10_ times per session. Do _1_ sessions per day.  Copyright  VHI. All rights reserved.   Perform these at a counter for balance assistance as needed:  Toe / Heel Raise (Standing)    Standing with support, raise heels up and hold for 5 seconds, then raise toes up for 5 seconds. Repeat _10_ times. 1  time a day  Copyright  VHI. All rights reserved.     Standing Hip Abduction  While standing upright, place band around both ankles and kick leg out to the side while keeping toes straight on both feet. Avoid leaning to the side or hiking hip up to gain more motion. Repeat with other leg, alternating legs.   10 reps each leg. 1 time a day        LOOPED ELASTIC BAND HIP EXTENSION  While standing with an elastic band looped around your ankles, move the target leg back as shown. Repeat with the other leg, alternating legs.    Keep your knees straight the entire time.   10 reps each leg. 1 time a day.    Perform these in the corner with a chair in front for safety:      Feet Together, Varied Arm Positions - Eyes Closed    Stand with feet together and arms at sides. Close eyes and visualize upright position. Hold _30_ seconds. Repeat _3_ times per session. Do _1_ sessions per day.  Copyright  VHI. All rights reserved.    Single Leg - Eyes Open    Holding support, lift right leg while maintaining balance over left leg. Progress to removing hands from support surface for longer periods of time. Hold_15_ seconds.  Then left left leg up while standing on right leg. Progress to removing hands for support surface as able. Hold for 15 seconds.   Do _1_ sessions per day.  Copyright  VHI. All rights reserved.       PT Education - 10/26/17 0929    Education provided  Yes    Education Details  HEP for strengthening and balance    Person(s) Educated  Patient    Methods  Explanation;Demonstration;Verbal cues;Handout    Comprehension  Verbalized understanding;Returned demonstration;Verbal cues required;Need further instruction       PT Short Term Goals - 10/21/17 1248      PT SHORT TERM GOAL #1   Title  Patient demonstrates understanding of initial HEP. (All STGs Target Date: 11/19/2017)    Time  1    Period  Months    Status  New    Target Date  11/19/17        PT SHORT TERM GOAL #2   Title  Patient reaches 5" anteriorly and looks over shoulders with trunk rotation without UE support with supervision.     Time  1    Period  Months    Status  New    Target Date  11/19/17      PT SHORT TERM GOAL #3   Title  Patient ambulates 300' with cane with minA scanning environment & negotiating around  obstacles.     Time  1    Period  Months    Status  New    Target Date  11/19/17      PT SHORT TERM GOAL #4   Title  Patient negotiates ramps, curbs and stairs with 1 rail with cane with minA.    Time  1    Period  Months    Status  New    Target Date  11/19/17        PT Long Term Goals - 10/21/17 1242      PT LONG TERM GOAL #1   Title  Patient verbalizes & demonstrates ongoing HEP / fitness plan. (All LTGs Target Date: 01/14/2018)    Time  12    Period  Weeks    Status  New    Target Date  01/14/18      PT LONG TERM GOAL #2   Title  Berg Balance >45/56 to indicate lower fall risk.     Time  12    Period  Weeks    Status  New    Target Date  01/14/18      PT LONG TERM GOAL #3   Title  Timed Up-Go with cane or less <13.5sec independently.     Time  12    Period  Weeks    Status  New    Target Date  01/14/18      PT LONG TERM GOAL #4   Title  Patient ambulates 500' outdoors including grass with cane or less modified independent.     Time  12    Period  Weeks    Status  New    Target Date  01/14/18      PT LONG TERM GOAL #5   Title  Patient negotiates ramps, curbs with cane or less and stairs with single rail modified independent.     Time  12    Period  Weeks    Status  New    Target Date  01/14/18            Plan - 10/26/17 0852    Clinical Impression Statement  Today's skilled session focused on establishment of an HEP to address strengthening and balance. No issues reported during performance in session today. Pt is progressing toward goals and should benefit from continued PT to progress toward unmet goals.      Rehab Potential  Good    PT Frequency  2x / week    PT Duration  12 weeks    PT Treatment/Interventions  ADLs/Self Care Home Management;DME Instruction;Gait training;Stair training;Functional mobility training;Therapeutic exercise;Therapeutic activities;Balance training;Neuromuscular re-education;Patient/family education;Vestibular;Canalith Repostioning    PT Next Visit Plan  continued to address balance- balance board, stepping strategies, dynamic gait in parallel bars    Consulted and Agree with Plan of Care  Patient;Family member/caregiver    Family Member Consulted  wife, Kendarious Gudino       Patient will benefit from skilled therapeutic intervention in order to improve the following deficits and impairments:  Abnormal gait, Decreased activity tolerance, Decreased balance, Decreased endurance, Decreased coordination, Decreased knowledge of use of DME, Decreased mobility, Decreased strength, Postural dysfunction  Visit Diagnosis: Muscle weakness (generalized)  Unsteadiness on feet  Other abnormalities of gait and mobility     Problem List Patient Active Problem List   Diagnosis Date Noted  . Ischemic cardiomyopathy 09/01/2017  . Right hip pain   . Acute otitis media   . Chronic systolic CHF (  congestive heart failure) ([place])   . Pressure injury of skin 07/20/2017  . Malignant otitis externa 07/19/2017  . Fall at home, initial encounter 07/19/2017  . S/P left THA, AA 05/12/2016  . LPRD (laryngopharyngeal reflux disease) 05/21/2015  . Other allergic rhinitis 05/21/2015  . Obesity 12/12/2014  . Upper airway cough syndrome 12/11/2014  . Cough variant asthma 07/27/2014  . Wheezing 06/20/2014  . Symptomatic bradycardia - s/p Biotronik (serial number [id number]) 02/08/2014  . Diabetes mellitus type 2, noninsulin dependent ([place])   . Edema of foot 03/06/2013  . Traumatic ecchymosis of right foot 03/06/2013  . Bone spur 12/30/2012  . Pain of toe of left foot 12/30/2012  .  Hyperlipidemia 07/13/2012  . HTN (hypertension) 04/12/2012  . Sick sinus syndrome ([place]) 08/17/2011  . Permanent atrial fibrillation ([place]) 09/16/2010    Willow Ora, PTA, [place] [address] [place], [place] [address] [phone] 10/26/17, 2:25 PM   Name: Oscar Baker MRN: [id number] Date of Birth: 1933-05-24

## 2017-10-26 NOTE — Patient Instructions (Addendum)
Sit to Stand / Stand to Sit / Transfers    Sit on edge of a solid chair with arms, feet flat on floor. Lean forward over feet and stand up with hands on chair arms. Sit down slowly with hands on chair arms. Repeat _10_ times per session. Do _1_ sessions per day.  Copyright  VHI. All rights reserved.   Perform these at a counter for balance assistance as needed:  Toe / Heel Raise (Standing)    Standing with support, raise heels up and hold for 5 seconds, then raise toes up for 5 seconds. Repeat _10_ times. 1 time a day  Copyright  VHI. All rights reserved.     Standing Hip Abduction  While standing upright, place band around both ankles and kick leg out to the side while keeping toes straight on both feet. Avoid leaning to the side or hiking hip up to gain more motion. Repeat with other leg, alternating legs.   10 reps each leg. 1 time a day        LOOPED ELASTIC BAND HIP EXTENSION  While standing with an elastic band looped around your ankles, move the target leg back as shown. Repeat with the other leg, alternating legs.    Keep your knees straight the entire time.   10 reps each leg. 1 time a day.    Perform these in the corner with a chair in front for safety:      Feet Together, Varied Arm Positions - Eyes Closed    Stand with feet together and arms at sides. Close eyes and visualize upright position. Hold _30_ seconds. Repeat _3_ times per session. Do _1_ sessions per day.  Copyright  VHI. All rights reserved.    Single Leg - Eyes Open    Holding support, lift right leg while maintaining balance over left leg. Progress to removing hands from support surface for longer periods of time. Hold_15_ seconds.  Then left left leg up while standing on right leg. Progress to removing hands for support surface as able. Hold for 15 seconds.   Do _1_ sessions per day.  Copyright  VHI. All rights reserved.

## 2017-10-27 ENCOUNTER — Encounter: Payer: Self-pay | Admitting: Physical Therapy

## 2017-10-27 ENCOUNTER — Ambulatory Visit: Payer: Medicare Other | Admitting: Physical Therapy

## 2017-10-27 DIAGNOSIS — R296 Repeated falls: Secondary | ICD-10-CM

## 2017-10-27 DIAGNOSIS — M6281 Muscle weakness (generalized): Secondary | ICD-10-CM

## 2017-10-27 DIAGNOSIS — R2681 Unsteadiness on feet: Secondary | ICD-10-CM | POA: Diagnosis not present

## 2017-10-27 DIAGNOSIS — R2689 Other abnormalities of gait and mobility: Secondary | ICD-10-CM

## 2017-10-28 NOTE — Therapy (Signed)
[facility] [address] [place], [name], [address] Phone: [phone]   Fax:  [phone]  Physical Therapy Treatment  Patient Details  Name: Oscar [place] MRN: [id number] Date of Birth: 1932/12/30 Referring Provider: [name], MD   Encounter Date: 10/27/2017     10/27/17 1107  PT Visits / Re-Eval  Visit Number 3  Number of Visits 25  Date for PT Re-Evaluation 01/18/18  Authorization  Authorization Type Medicare & BCBS supplement  Authorization Time Period medicare guidelines with medical necessity; 100% coverage  PT Time Calculation  PT Start Time 1105  PT Stop Time 1145  PT Time Calculation (min) 40 min  PT - End of Session  Equipment Utilized During Treatment Gait belt  Activity Tolerance Patient tolerated treatment well  Behavior During Therapy [facility] for tasks assessed/performed    Past Medical History:  Diagnosis Date  . Atrial fibrillation ([place]) 09/16/2010  . Chronic systolic heart failure (HCC)    Echo 1/19: Mild LVH, EF 45-50, inf HK, MAC, severe LAE, severe RAE // Echo 7/15: Mild LVH, mod focal basal sept hypertrophy, EF 55-60, AV peak and mean 16/9, trivial MR, mod LAE, PASP 38  . CLL (chronic lymphocytic leukemia) ([place])   . Diabetes mellitus type 2, noninsulin dependent ([place])   . 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 History of myocardial infarction    Nuclear stress test 2/19: Large inferior infarct, no ischemia  . History of nuclear stress test    Nuclear stress test 12/0 7: No ischemia, EF 51  . Hyperlipidemia   . Hypertension   . Ischemic cardiomyopathy 09/01/2017   Presumed +CAD with Nuclear stress test 08/18/17 - Inferior scar, no ischemia, intermediate risk // med management unless +angina or worse dyspnea  . Pacemaker 02/08/2014   DR Lovena Le  .  prostate ca dx'd 2006   xrt; prostatectomy  . Sick sinus syndrome [facility])    a-Flutter with episodes of bradycardia; S/P Biotronik (serial number [id number])    Past Surgical History:  Procedure Laterality Date  . APPENDECTOMY    . BACK SURGERY     disk  . INSERT / REPLACE / REMOVE PACEMAKER  02/08/2014    DR Lovena Le  . PACEMAKER INSERTION  02/08/2014   Biotronik (serial number [id number])  . PERMANENT PACEMAKER INSERTION N/A 02/08/2014   Procedure: PERMANENT PACEMAKER INSERTION;  Surgeon: Evans Lance, MD;  Location: [facility] CATH LAB;  Service: Cardiovascular;  Laterality: N/A;  . TONSILLECTOMY    . TOTAL HIP ARTHROPLASTY Left 05/12/2016   Procedure: LEFT TOTAL HIP ARTHROPLASTY ANTERIOR APPROACH;  Surgeon: Paralee Cancel, MD;  [facility];  Service: Orthopedics;  Laterality: Left;  . TOTAL KNEE ARTHROPLASTY      There were no vitals filed for this visit.     10/27/17 1106  Symptoms/Limitations  Subjective No new complaints. No falls or pain to report.   Pertinent History Left THA 05/12/16, right THA 2008, CHF, MI, DM2, HTN, OA, prostate CA, shingles, A-fib, hearing loss, knee sg,   Limitations Lifting;Standing;House hold activities;Walking  Patient Stated Goals to get off walker, be able to go out community  Pain Assessment  Currently in Pain? No/denies     10/27/17 1108  Transfers  Transfers Sit to Stand;Stand to Sit  Sit to Stand 5: Supervision;With upper extremity assist;With armrests;From chair/3-in-1  Stand to Sit 5:  Supervision;With upper extremity assist;With armrests;To chair/3-in-1  Ambulation/Gait  Ambulation/Gait Yes  Ambulation/Gait Assistance 5: Supervision  Ambulation/Gait Assistance Details cues for decreased UE weight bearing on RW  Ambulation Distance (Feet) 110 Feet (x2)  Assistive device Rolling walker  Gait Pattern Step-through pattern;Decreased arm swing - left;Decreased step length - left;Decreased stance time - right;Decreased stride length;Lateral hip  instability;Trunk flexed;Wide base of support  Ambulation Surface Level;Indoor  High Level Balance  High Level Balance Activities Marching forwards;Marching backwards;Tandem walking (tandem/heel/toe walking fwd/bwd)  High Level Balance Comments in parallel bars with light UE support on bars x 3 laps each. min guard assist for balance with cues on posture, weight shifting and stance stability.                    10/27/17 1142  Balance Exercises: Standing  Standing Eyes Closed Wide (BOA);Head turns;Foam/compliant surface;Other reps (comment);30 secs;Limitations  Balance Beam standing across blue foam beam: alternating fwd stepping to floor/back onto beam, then alternating bwd stepping to floor/back onto beam. light UE support on bars with min guard assist. cues on step length/height and weight shifting to assist with balance.                                 Balance Exercises: Standing  Standing Eyes Closed Limitations on airex in parallel bars: EC no head movements, progressing to EC head movements left<>right, then up<>down, min assist with no UE support. cues on posture and weight shifitng to assist with balance correction.                                    PT Short Term Goals - 10/21/17 1248      PT SHORT TERM GOAL #1   Title  Patient demonstrates understanding of initial HEP. (All STGs Target Date: 11/19/2017)    Time  1    Period  Months    Status  New    Target Date  11/19/17      PT SHORT TERM GOAL #2   Title  Patient reaches 5" anteriorly and looks over shoulders with trunk rotation without UE support with supervision.     Time  1    Period  Months    Status  New    Target Date  11/19/17      PT SHORT TERM GOAL #3   Title  Patient ambulates 300' with cane with minA scanning environment & negotiating around obstacles.     Time  1    Period  Months    Status  New    Target Date  11/19/17      PT SHORT TERM GOAL #4   Title  Patient negotiates ramps, curbs and stairs with  1 rail with cane with minA.    Time  1    Period  Months    Status  New    Target Date  11/19/17        PT Long Term Goals - 10/21/17 1242      PT LONG TERM GOAL #1   Title  Patient verbalizes & demonstrates ongoing HEP / fitness plan. (All LTGs Target Date: 01/14/2018)    Time  12    Period  Weeks    Status  New    Target Date  01/14/18      PT LONG TERM  GOAL #2   Title  Berg Balance >45/56 to indicate lower fall risk.     Time  12    Period  Weeks    Status  New    Target Date  01/14/18      PT LONG TERM GOAL #3   Title  Timed Up-Go with cane or less <13.5sec independently.     Time  12    Period  Weeks    Status  New    Target Date  01/14/18      PT LONG TERM GOAL #4   Title  Patient ambulates 500' outdoors including grass with cane or less modified independent.     Time  12    Period  Weeks    Status  New    Target Date  01/14/18      PT LONG TERM GOAL #5   Title  Patient negotiates ramps, curbs with cane or less and stairs with single rail modified independent.     Time  12    Period  Weeks    Status  New    Target Date  01/14/18         10/27/17 1107  Plan  Clinical Impression Statement Today's skilled session continued to address high level balance reactions with no significant issues noted. Pt did need occasional rest breaks due to fatigue. Pt continues to be challenged by complaint surfaces and/or vision removed. Pt should benefit from continued PT to progress toward unmet goals.  Pt will benefit from skilled therapeutic intervention in order to improve on the following deficits Abnormal gait;Decreased activity tolerance;Decreased balance;Decreased endurance;Decreased coordination;Decreased knowledge of use of DME;Decreased mobility;Decreased strength;Postural dysfunction  Rehab Potential Good  PT Frequency 2x / week  PT Duration 12 weeks  PT Treatment/Interventions ADLs/Self Care Home Management;DME Instruction;Gait training;Stair training;Functional  mobility training;Therapeutic exercise;Therapeutic activities;Balance training;Neuromuscular re-education;Patient/family education;Vestibular;Canalith Repostioning  PT Next Visit Plan continue to address balance deficites, progressing to gait training with cane  Consulted and Agree with Plan of Care Patient;Family member/caregiver  Family Member Consulted wife, Kenon [place]ashmit      Patient will benefit from skilled therapeutic intervention in order to improve the following deficits and impairments:  Abnormal gait, Decreased activity tolerance, Decreased balance, Decreased endurance, Decreased coordination, Decreased knowledge of use of DME, Decreased mobility, Decreased strength, Postural dysfunction  Visit Diagnosis: Muscle weakness (generalized)  Unsteadiness on feet  Other abnormalities of gait and mobility  Repeated falls     Problem List Patient Active Problem List   Diagnosis Date Noted  . Ischemic cardiomyopathy 09/01/2017  . Right hip pain   . Acute otitis media   . Chronic systolic CHF (congestive heart failure) ([place])   . Pressure injury of skin 07/20/2017  . Malignant otitis externa 07/19/2017  . Fall at home, initial encounter 07/19/2017  . S/P left THA, AA 05/12/2016  . LPRD (laryngopharyngeal reflux disease) 05/21/2015  . Other allergic rhinitis 05/21/2015  . Obesity 12/12/2014  . Upper airway cough syndrome 12/11/2014  . Cough variant asthma 07/27/2014  . Wheezing 06/20/2014  . Symptomatic bradycardia - s/p Biotronik (serial number [id number]) 02/08/2014  . Diabetes mellitus type 2, noninsulin dependent ([place])   . Edema of foot 03/06/2013  . Traumatic ecchymosis of right foot 03/06/2013  . Bone spur 12/30/2012  . Pain of toe of left foot 12/30/2012  . Hyperlipidemia 07/13/2012  . HTN (hypertension) 04/12/2012  . Sick sinus syndrome ([place]) 08/17/2011  . Permanent atrial fibrillation ([place]) 09/16/2010   Willow Ora,  PTA, [facility] [address], [place], [place] [address] [phone] 10/28/17, 11:14 PM   Name: KESHAN REHA MRN: [id number] Date of Birth: Nov 22, 1932

## 2017-10-29 ENCOUNTER — Encounter (HOSPITAL_BASED_OUTPATIENT_CLINIC_OR_DEPARTMENT_OTHER): Payer: Self-pay | Admitting: *Deleted

## 2017-11-01 ENCOUNTER — Other Ambulatory Visit: Payer: Self-pay

## 2017-11-01 ENCOUNTER — Telehealth: Payer: Self-pay | Admitting: *Deleted

## 2017-11-01 ENCOUNTER — Encounter (HOSPITAL_BASED_OUTPATIENT_CLINIC_OR_DEPARTMENT_OTHER): Payer: Self-pay

## 2017-11-01 ENCOUNTER — Ambulatory Visit (INDEPENDENT_AMBULATORY_CARE_PROVIDER_SITE_OTHER): Payer: Medicare Other | Admitting: *Deleted

## 2017-11-01 ENCOUNTER — Ambulatory Visit: Payer: Medicare Other | Admitting: Physical Therapy

## 2017-11-01 ENCOUNTER — Encounter: Payer: Self-pay | Admitting: Physical Therapy

## 2017-11-01 DIAGNOSIS — I482 Chronic atrial fibrillation: Secondary | ICD-10-CM

## 2017-11-01 DIAGNOSIS — R296 Repeated falls: Secondary | ICD-10-CM | POA: Diagnosis not present

## 2017-11-01 DIAGNOSIS — R2689 Other abnormalities of gait and mobility: Secondary | ICD-10-CM | POA: Diagnosis not present

## 2017-11-01 DIAGNOSIS — R2681 Unsteadiness on feet: Secondary | ICD-10-CM | POA: Diagnosis not present

## 2017-11-01 DIAGNOSIS — I4821 Permanent atrial fibrillation: Secondary | ICD-10-CM

## 2017-11-01 DIAGNOSIS — Z5181 Encounter for therapeutic drug level monitoring: Secondary | ICD-10-CM

## 2017-11-01 DIAGNOSIS — M6281 Muscle weakness (generalized): Secondary | ICD-10-CM

## 2017-11-01 LAB — POCT INR: INR: 2.1

## 2017-11-01 NOTE — Therapy (Signed)
[facility] [address] [place], [name], [address] Phone: [phone]   Fax:  [phone]  Physical Therapy Treatment  Patient Details  Name: Oscar [place] MRN: [id number] Date of Birth: 1933-02-20 Referring Provider: [name], MD   Encounter Date: 11/01/2017  PT End of Session - 11/01/17 0851    Visit Number  4    Number of Visits  25    Date for PT Re-Evaluation  01/18/18    Authorization Type  Medicare & BCBS supplement    Authorization Time Period  medicare guidelines with medical necessity; 100% coverage    PT Start Time  0848    PT Stop Time  0927    PT Time Calculation (min)  39 min    Equipment Utilized During Treatment  Gait belt    Activity Tolerance  Patient tolerated treatment well    Behavior During Therapy  [facility] for tasks assessed/performed       Past Medical History:  Diagnosis Date  . Bladder tumor   . Chronic systolic heart failure (HCC)    Echo 1/19: Mild LVH, EF 45-50, inf HK, MAC, severe LAE, severe RAE // Echo 7/15: Mild LVH, mod focal basal sept hypertrophy, EF 55-60, AV peak and mean 16/9, trivial MR, mod LAE, PASP 38  . CLL (chronic lymphocytic leukemia) [facility]) oncologist-  dr Ilene Qua--   dx [phone] ;  Lymphocytosis, CLL - per lov note 05-11-2017 currently under active survillance,  CT 04-17-2014 show very small lymphadenopathy, no indication for treatment  . Coronary artery disease    cardiologist-  dr Cathie Olden--  08-18-2017 Intermittant risk nuclear study w/ large area of inferior infartion with no evidence ishcemia   . Diabetes mellitus type 2, noninsulin dependent ([place])   . History of MI (myocardial infarction)    per myoview nuclear study 08-18-2017 , unknown when  . Hyperlipidemia   . Hypertension   . Ischemic cardiomyopathy 09/01/2017   Presumed +CAD with Nuclear stress test 08/18/17 - Inferior scar, no ischemia, intermediate risk // med management unless +angina or worse dyspnea  .  OA (osteoarthritis)   . Pacemaker 02/08/2014   followed by dr g. taylor--  single chamber Biotronik due to SSS  . Permanent atrial fibrillation ([place])   . Prostate cancer [facility]) urologist-  dr Diona Fanti   dx 2004--  Gleason 8, PSA 10.45--  11-28-2002  s/p  radical prostatectomy;  recurrent w/ increasing PSA, started ADT treatment  . Sick sinus syndrome [facility])    a-Flutter with episodes of bradycardia; S/P Biotronik (serial number [id number]) 02-08-2014    Past Surgical History:  Procedure Laterality Date  . APPENDECTOMY    . BACK SURGERY     disk  . CARDIAC CATHETERIZATION  09-03-1999  dr Cathie Olden   abnormal cardiolite study:  minor luminal irregularities but no critial coronary artery stenosis  . CARDIOVASCULAR STRESS TEST  08-18-2017  dr Cathie Olden   Intermediate risk nuclear study w/ large area inferior infarction, no evidence of ishcemia (consistant w/ prior MI)/  study not gated due to frequent PVCs  . INSERTION PENILE PROSTHESIS  02-22-2004    dr Mattie Marlin  [facility]  . PERMANENT PACEMAKER INSERTION N/A 02/08/2014   Procedure: PERMANENT PACEMAKER INSERTION;  Surgeon: Evans Lance, MD;  Location: [facility] CATH LAB;  Service: Cardiovascular;  Laterality: N/A;  . RADICAL RETROPUBIC PROSTATECTOMY W/ BILATERAL PELVIC LYMPH NODE DISSECTION  11-28-2002   dr Mattie Marlin  [facility]  . TONSILLECTOMY    . TOTAL  HIP ARTHROPLASTY Left 05/12/2016   Procedure: LEFT TOTAL HIP ARTHROPLASTY ANTERIOR APPROACH;  Surgeon: Paralee Cancel, MD;  [facility];  Service: Orthopedics;  Laterality: Left;  . TOTAL KNEE ARTHROPLASTY Right 07-15-2006   dr Alvan Dame  [facility]  . TRANSTHORACIC ECHOCARDIOGRAM  07/20/2017   mild LVH, ef 45-50%, hypokinesis of the basal-midinferior myocardium, due to AFib unable to evaluate diastolic function/  severe LAE and RAE/  trivial PR and TR    There were no vitals filed for this visit.  Subjective Assessment - 11/01/17 0850    Subjective  No new complaints. No falls or pain to report.     Pertinent History   Left THA 05/12/16, right THA 2008, CHF, MI, DM2, HTN, OA, prostate CA, shingles, A-fib, hearing loss, knee sg,     Limitations  Lifting;Standing;House hold activities;Walking    Patient Stated Goals  to get off walker, be able to go out community    Currently in Pain?  No/denies           [facility] Adult PT Treatment/Exercise - 11/01/17 0851      Transfers   Transfers  Sit to Stand;Stand to Sit    Sit to Stand  5: Supervision;With upper extremity assist;With armrests;From chair/3-in-1    Stand to Sit  5: Supervision;With upper extremity assist;With armrests;To chair/3-in-1      Ambulation/Gait   Ambulation/Gait  Yes    Ambulation/Gait Assistance  5: Supervision;4: Min assist;3: Mod assist    Ambulation/Gait Assistance Details  used cane to walk around gym between activities with min assist intially progressing to mod assist with fatigue. cues on posture, sequencing and technique    Ambulation Distance (Feet)  110 Feet x2 with RW; around gym with cane    Assistive device  Rolling walker;Straight cane    Gait Pattern  Step-through pattern;Decreased arm swing - left;Decreased step length - left;Decreased stance time - right;Decreased stride length;Lateral hip instability;Trunk flexed;Wide base of support    Ambulation Surface  Level;Indoor      High Level Balance   High Level Balance Activities  Side stepping;Tandem walking;Marching forwards;Marching backwards tandem gait fwd/bwd    High Level Balance Comments  on red mats next to counter top for balance assist as needed:       Knee/Hip Exercises: Aerobic   Other Aerobic  Scifit UE/LE's at level 2.0 for 8 minutes with >/= 60 rpm for strengthening and activity tolerance               PT Short Term Goals - 10/21/17 1248      PT SHORT TERM GOAL #1   Title  Patient demonstrates understanding of initial HEP. (All STGs Target Date: 11/19/2017)    Time  1    Period  Months    Status  New    Target Date  11/19/17      PT SHORT  TERM GOAL #2   Title  Patient reaches 5" anteriorly and looks over shoulders with trunk rotation without UE support with supervision.     Time  1    Period  Months    Status  New    Target Date  11/19/17      PT SHORT TERM GOAL #3   Title  Patient ambulates 300' with cane with minA scanning environment & negotiating around obstacles.     Time  1    Period  Months    Status  New    Target Date  11/19/17  PT SHORT TERM GOAL #4   Title  Patient negotiates ramps, curbs and stairs with 1 rail with cane with minA.    Time  1    Period  Months    Status  New    Target Date  11/19/17        PT Long Term Goals - 10/21/17 1242      PT LONG TERM GOAL #1   Title  Patient verbalizes & demonstrates ongoing HEP / fitness plan. (All LTGs Target Date: 01/14/2018)    Time  12    Period  Weeks    Status  New    Target Date  01/14/18      PT LONG TERM GOAL #2   Title  Berg Balance >45/56 to indicate lower fall risk.     Time  12    Period  Weeks    Status  New    Target Date  01/14/18      PT LONG TERM GOAL #3   Title  Timed Up-Go with cane or less <13.5sec independently.     Time  12    Period  Weeks    Status  New    Target Date  01/14/18      PT LONG TERM GOAL #4   Title  Patient ambulates 500' outdoors including grass with cane or less modified independent.     Time  12    Period  Weeks    Status  New    Target Date  01/14/18      PT LONG TERM GOAL #5   Title  Patient negotiates ramps, curbs with cane or less and stairs with single rail modified independent.     Time  12    Period  Weeks    Status  New    Target Date  01/14/18            Plan - 11/01/17 0851    Clinical Impression Statement  Today's skilled session continued to address balance, strengthening and initiated gait traning with cane. Pt requires up to mod assist with gait with cane when fatigued. Pt is progressing toward goals and should benefit from continued PT to progress toward unmet goals.      Rehab Potential  Good    PT Frequency  2x / week    PT Duration  12 weeks    PT Treatment/Interventions  ADLs/Self Care Home Management;DME Instruction;Gait training;Stair training;Functional mobility training;Therapeutic exercise;Therapeutic activities;Balance training;Neuromuscular re-education;Patient/family education;Vestibular;Canalith Repostioning    PT Next Visit Plan  continue to address balance deficites, continue to gait train with cane    Consulted and Agree with Plan of Care  Patient;Family member/caregiver    Family Member Consulted  wife, Zevin Nevares       Patient will benefit from skilled therapeutic intervention in order to improve the following deficits and impairments:  Abnormal gait, Decreased activity tolerance, Decreased balance, Decreased endurance, Decreased coordination, Decreased knowledge of use of DME, Decreased mobility, Decreased strength, Postural dysfunction  Visit Diagnosis: Muscle weakness (generalized)  Unsteadiness on feet  Other abnormalities of gait and mobility  Repeated falls     Problem List Patient Active Problem List   Diagnosis Date Noted  . Ischemic cardiomyopathy 09/01/2017  . Right hip pain   . Acute otitis media   . Chronic systolic CHF (congestive heart failure) ([place])   . Pressure injury of skin 07/20/2017  . Malignant otitis externa 07/19/2017  . Fall at home, initial encounter 07/19/2017  .  S/P left THA, AA 05/12/2016  . LPRD (laryngopharyngeal reflux disease) 05/21/2015  . Other allergic rhinitis 05/21/2015  . Obesity 12/12/2014  . Upper airway cough syndrome 12/11/2014  . Cough variant asthma 07/27/2014  . Wheezing 06/20/2014  . Symptomatic bradycardia - s/p Biotronik (serial number [id number]) 02/08/2014  . Diabetes mellitus type 2, noninsulin dependent ([place])   . Edema of foot 03/06/2013  . Traumatic ecchymosis of right foot 03/06/2013  . Bone spur 12/30/2012  . Pain of toe of left foot 12/30/2012  . Hyperlipidemia  07/13/2012  . HTN (hypertension) 04/12/2012  . Sick sinus syndrome ([place]) 08/17/2011  . Permanent atrial fibrillation ([place]) 09/16/2010    Willow Ora, PTA, [facility] [address], [place], [place] [address] [phone] 11/01/17, 9:32 AM   Name: Oscar [place] MRN: [id number] Date of Birth: [birth year]

## 2017-11-01 NOTE — Patient Instructions (Addendum)
Description   Continue holding for upcoming procedure on 11/04/17. Resume normal dose after procedure on 11/04/17. Normal dose: Continue taking 1 tablet every day except 1.5  tablets on Saturdays. Recheck INR in 1 week after procedure on 11/04/17.  Call [facility] [phone] with any concerns

## 2017-11-01 NOTE — Telephone Encounter (Signed)
   [facility] Pre-operative Risk Assessment    Request for surgical clearance:  1. What type of surgery is being performed? Cystoscopy transurethral resection of bladder tumor.  2. When is this surgery scheduled? TBD  3. What type of clearance is required (medical clearance vs. Pharmacy clearance to hold med vs. Both)? Medical and Pharmacy  4. Are there any medications that need to be held prior to surgery and how long? Stop coumadin 5 days prior to surgery.  5. Practice name and name of physician performing surgery? [facility]   6. What is your office phone number? [phone]   7.   What is your office fax number? [phone]  8.   Anesthesia type (None, local, MAC, general) ? Not listed    Oscar [place] D 11/01/2017, 11:10 AM  _________________________________________________________________   (provider comments below)

## 2017-11-01 NOTE — Progress Notes (Signed)
Spoke with:  Oscar [place]:  After Midnight, no gum, candy, or mints   Arrival time:  1015AM Labs:  Istat 8, PT/INR AM medications:  Coreg, Fenofibrate (EKG 09/02/2017 in epic) Pre op orders:  Yes Ride home:  Opal Sidles (wife) [phone]

## 2017-11-01 NOTE — Telephone Encounter (Signed)
Patient with diagnosis of Afib on warfarin for anticoagulation.    Procedure: TURP Date of procedure: 11/04/17  CHADS2-VASc score of  5 (CHF, HTN, AGE, DM2, stroke/tia x 2, CAD, AGE, male)  Per office protocol, patient can hold warfarin for 5 days prior to procedure.   Patient will not need bridging with Lovenox (enoxaparin) around procedure.

## 2017-11-02 DIAGNOSIS — I4892 Unspecified atrial flutter: Secondary | ICD-10-CM | POA: Diagnosis not present

## 2017-11-02 DIAGNOSIS — N183 Chronic kidney disease, stage 3 (moderate): Secondary | ICD-10-CM | POA: Diagnosis not present

## 2017-11-02 DIAGNOSIS — N329 Bladder disorder, unspecified: Secondary | ICD-10-CM | POA: Diagnosis not present

## 2017-11-02 DIAGNOSIS — R2689 Other abnormalities of gait and mobility: Secondary | ICD-10-CM | POA: Diagnosis not present

## 2017-11-02 DIAGNOSIS — Z7901 Long term (current) use of anticoagulants: Secondary | ICD-10-CM | POA: Diagnosis not present

## 2017-11-02 DIAGNOSIS — I130 Hypertensive heart and chronic kidney disease with heart failure and stage 1 through stage 4 chronic kidney disease, or unspecified chronic kidney disease: Secondary | ICD-10-CM | POA: Diagnosis not present

## 2017-11-02 DIAGNOSIS — E1122 Type 2 diabetes mellitus with diabetic chronic kidney disease: Secondary | ICD-10-CM | POA: Diagnosis not present

## 2017-11-02 DIAGNOSIS — N401 Enlarged prostate with lower urinary tract symptoms: Secondary | ICD-10-CM | POA: Diagnosis not present

## 2017-11-02 DIAGNOSIS — Z6831 Body mass index (BMI) 31.0-31.9, adult: Secondary | ICD-10-CM | POA: Diagnosis not present

## 2017-11-02 DIAGNOSIS — I5022 Chronic systolic (congestive) heart failure: Secondary | ICD-10-CM | POA: Diagnosis not present

## 2017-11-02 DIAGNOSIS — I2510 Atherosclerotic heart disease of native coronary artery without angina pectoris: Secondary | ICD-10-CM | POA: Diagnosis not present

## 2017-11-02 DIAGNOSIS — I129 Hypertensive chronic kidney disease with stage 1 through stage 4 chronic kidney disease, or unspecified chronic kidney disease: Secondary | ICD-10-CM | POA: Diagnosis not present

## 2017-11-02 NOTE — Progress Notes (Signed)
Received device orders via fax that were requested from dr gregg taylor, place in chart.

## 2017-11-02 NOTE — Telephone Encounter (Signed)
   Primary Cardiologist: Mertie Moores, MD  Left message for patient to call back.  APP needs to assess for any clinical change since last OV.  Chart reviewed as part of pre-operative protocol coverage.   84 y.o. male with a hx of AFib, SSS s/p PPM, diabetes, hypertension, hyperlipidemia, CLL, prostate CA, systolic CHF and presumed ischemic CM.  Stress test in 3/19 demonstrated inf scar but no ischemia.  EF is 45-50 by echo.  Last seen 3/19.  He was doing well and no further testing was planned in regards to his upcoming urologic surgery.  Richardson Dopp, PA-C 11/02/2017, 4:57 PM

## 2017-11-03 ENCOUNTER — Ambulatory Visit: Payer: Medicare Other | Admitting: Physical Therapy

## 2017-11-03 ENCOUNTER — Encounter: Payer: Self-pay | Admitting: Physical Therapy

## 2017-11-03 DIAGNOSIS — R2681 Unsteadiness on feet: Secondary | ICD-10-CM

## 2017-11-03 DIAGNOSIS — R296 Repeated falls: Secondary | ICD-10-CM | POA: Diagnosis not present

## 2017-11-03 DIAGNOSIS — M6281 Muscle weakness (generalized): Secondary | ICD-10-CM | POA: Diagnosis not present

## 2017-11-03 DIAGNOSIS — R2689 Other abnormalities of gait and mobility: Secondary | ICD-10-CM
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
## 2017-11-04 ENCOUNTER — Encounter (HOSPITAL_BASED_OUTPATIENT_CLINIC_OR_DEPARTMENT_OTHER)
Admission: RE | Disposition: A | Discharge status: Home / Self Care | Payer: Self-pay | Source: Ambulatory Visit | Attending: Urology

## 2017-11-04 ENCOUNTER — Ambulatory Visit (HOSPITAL_BASED_OUTPATIENT_CLINIC_OR_DEPARTMENT_OTHER): Payer: Medicare Other | Admitting: Anesthesiology

## 2017-11-04 ENCOUNTER — Encounter (HOSPITAL_BASED_OUTPATIENT_CLINIC_OR_DEPARTMENT_OTHER): Payer: Self-pay

## 2017-11-04 ENCOUNTER — Ambulatory Visit (HOSPITAL_BASED_OUTPATIENT_CLINIC_OR_DEPARTMENT_OTHER)
Admission: RE | Admit: 2017-11-04 | Discharge: 2017-11-04 | Disposition: A | Discharge status: Home / Self Care | Payer: Medicare Other | Source: Ambulatory Visit | Attending: Urology | Admitting: Urology

## 2017-11-04 DIAGNOSIS — I252 Old myocardial infarction: Secondary | ICD-10-CM | POA: Insufficient documentation

## 2017-11-04 DIAGNOSIS — I110 Hypertensive heart disease with heart failure: Secondary | ICD-10-CM | POA: Insufficient documentation

## 2017-11-04 DIAGNOSIS — I2510 Atherosclerotic heart disease of native coronary artery without angina pectoris: Secondary | ICD-10-CM | POA: Insufficient documentation

## 2017-11-04 DIAGNOSIS — Z8546 Personal history of malignant neoplasm of prostate: Secondary | ICD-10-CM | POA: Diagnosis not present

## 2017-11-04 DIAGNOSIS — N3289 Other specified disorders of bladder: Secondary | ICD-10-CM | POA: Diagnosis not present

## 2017-11-04 DIAGNOSIS — Z7984 Long term (current) use of oral hypoglycemic drugs: Secondary | ICD-10-CM | POA: Insufficient documentation

## 2017-11-04 DIAGNOSIS — I5022 Chronic systolic (congestive) heart failure: Secondary | ICD-10-CM | POA: Insufficient documentation

## 2017-11-04 DIAGNOSIS — Z79899 Other long term (current) drug therapy: Secondary | ICD-10-CM | POA: Diagnosis not present

## 2017-11-04 DIAGNOSIS — I482 Chronic atrial fibrillation: Secondary | ICD-10-CM | POA: Insufficient documentation

## 2017-11-04 DIAGNOSIS — E785 Hyperlipidemia, unspecified: Secondary | ICD-10-CM | POA: Insufficient documentation

## 2017-11-04 DIAGNOSIS — J189 Pneumonia, unspecified organism: Secondary | ICD-10-CM | POA: Diagnosis not present

## 2017-11-04 DIAGNOSIS — N320 Bladder-neck obstruction: Secondary | ICD-10-CM | POA: Insufficient documentation

## 2017-11-04 DIAGNOSIS — E119 Type 2 diabetes mellitus without complications: Secondary | ICD-10-CM | POA: Diagnosis not present

## 2017-11-04 DIAGNOSIS — I781 Nevus, non-neoplastic: Secondary | ICD-10-CM | POA: Diagnosis not present

## 2017-11-04 DIAGNOSIS — Z7901 Long term (current) use of anticoagulants: Secondary | ICD-10-CM | POA: Insufficient documentation

## 2017-11-04 DIAGNOSIS — Z87891 Personal history of nicotine dependence: Secondary | ICD-10-CM | POA: Insufficient documentation

## 2017-11-04 HISTORY — DX: Unspecified urinary incontinence: R32

## 2017-11-04 HISTORY — DX: Permanent atrial fibrillation: I48.21

## 2017-11-04 HISTORY — DX: Presence of external hearing-aid: Z97.4

## 2017-11-04 HISTORY — DX: Elevated prostate specific antigen (PSA): R97.20

## 2017-11-04 HISTORY — DX: Personal history of other infectious and parasitic diseases: Z86.19

## 2017-11-04 HISTORY — DX: Hematuria, unspecified: R31.9

## 2017-11-04 HISTORY — DX: Unspecified asthma, uncomplicated: J45.909

## 2017-11-04 HISTORY — DX: Localized edema: R60.0

## 2017-11-04 HISTORY — DX: Pneumonia, unspecified organism: J18.9

## 2017-11-04 HISTORY — DX: Carpal tunnel syndrome, bilateral upper limbs: G56.03

## 2017-11-04 HISTORY — PX: CYSTOSCOPY: SHX5120

## 2017-11-04 HISTORY — DX: Unspecified hearing loss, right ear: H91.91

## 2017-11-04 HISTORY — DX: Personal history of other diseases of the nervous system and sense organs: Z86.69

## 2017-11-04 HISTORY — DX: Unspecified osteoarthritis, unspecified site: M19.90

## 2017-11-04 HISTORY — DX: Old myocardial infarction: I25.2

## 2017-11-04 HISTORY — DX: Atherosclerotic heart disease of native coronary artery without angina pectoris: I25.10

## 2017-11-04 HISTORY — DX: Neoplasm of unspecified behavior of bladder: D49.4

## 2017-11-04 HISTORY — DX: Unspecified right bundle-branch block: I45.10

## 2017-11-04 HISTORY — DX: Malignant neoplasm of prostate: C61

## 2017-11-04 LAB — POCT I-STAT, CHEM 8
BUN: 16 mg/dL (ref 6–20)
CREATININE: 0.8 mg/dL (ref 0.61–1.24)
Calcium, Ion: 1.26 mmol/L (ref 1.15–1.40)
Chloride: 105 mmol/L (ref 101–111)
GLUCOSE: 129 mg/dL — AB (ref 65–99)
HCT: 38 % — ABNORMAL LOW (ref 39.0–52.0)
Hemoglobin: 12.9 g/dL — ABNORMAL LOW (ref 13.0–17.0)
Potassium: 4.3 mmol/L (ref 3.5–5.1)
SODIUM: 143 mmol/L (ref 135–145)
TCO2: 26 mmol/L (ref 22–32)

## 2017-11-04 LAB — GLUCOSE, CAPILLARY: GLUCOSE-CAPILLARY: 115 mg/dL — AB (ref 65–99)

## 2017-11-04 LAB — PROTIME-INR
INR: 1.18
Prothrombin Time: 15 seconds (ref 11.4–15.2)

## 2017-11-04 SURGERY — CYSTOSCOPY
Anesthesia: General

## 2017-11-04 MED ORDER — PROPOFOL 10 MG/ML IV BOLUS
INTRAVENOUS | Status: AC
  Filled 2017-11-04: qty 20

## 2017-11-04 MED ORDER — DEXAMETHASONE SODIUM PHOSPHATE 10 MG/ML IJ SOLN
INTRAMUSCULAR | Status: AC
  Filled 2017-11-04: qty 1

## 2017-11-04 MED ORDER — ONDANSETRON HCL 4 MG/2ML IJ SOLN
INTRAMUSCULAR | Status: AC
  Filled 2017-11-04: qty 2

## 2017-11-04 MED ORDER — LACTATED RINGERS IV SOLN
INTRAVENOUS | Status: DC
  Administered 2017-11-04: 11:00:00 via INTRAVENOUS
  Filled 2017-11-04: qty 1000

## 2017-11-04 MED ORDER — CEFAZOLIN SODIUM-DEXTROSE 2-4 GM/100ML-% IV SOLN
INTRAVENOUS | Status: AC
  Filled 2017-11-04: qty 100

## 2017-11-04 MED ORDER — PHENYLEPHRINE 40 MCG/ML (10ML) SYRINGE FOR IV PUSH (FOR BLOOD PRESSURE SUPPORT)
PREFILLED_SYRINGE | INTRAVENOUS | Status: AC
  Filled 2017-11-04: qty 10

## 2017-11-04 MED ORDER — LIDOCAINE 2% (20 MG/ML) 5 ML SYRINGE
INTRAMUSCULAR | Status: AC
  Filled 2017-11-04: qty 5

## 2017-11-04 MED ORDER — FENTANYL CITRATE (PF) 100 MCG/2ML IJ SOLN
INTRAMUSCULAR | Status: DC | PRN
  Administered 2017-11-04: 50 ug via INTRAVENOUS
  Administered 2017-11-04 (×2): 25 ug via INTRAVENOUS

## 2017-11-04 MED ORDER — FENTANYL CITRATE (PF) 100 MCG/2ML IJ SOLN
INTRAMUSCULAR | Status: AC
  Filled 2017-11-04: qty 2

## 2017-11-04 MED ORDER — PROPOFOL 10 MG/ML IV BOLUS
INTRAVENOUS | Status: DC | PRN
  Administered 2017-11-04: 130 mg via INTRAVENOUS

## 2017-11-04 MED ORDER — DEXAMETHASONE SODIUM PHOSPHATE 4 MG/ML IJ SOLN
INTRAMUSCULAR | Status: DC | PRN
  Administered 2017-11-04: 5 mg via INTRAVENOUS

## 2017-11-04 MED ORDER — CEFAZOLIN SODIUM-DEXTROSE 2-4 GM/100ML-% IV SOLN
2.0000 g | INTRAVENOUS | Status: AC
  Administered 2017-11-04: 2 g via INTRAVENOUS
  Filled 2017-11-04: qty 100

## 2017-11-04 MED ORDER — ONDANSETRON HCL 4 MG/2ML IJ SOLN
INTRAMUSCULAR | Status: DC | PRN
  Administered 2017-11-04: 4 mg via INTRAVENOUS

## 2017-11-04 MED ORDER — LIDOCAINE 2% (20 MG/ML) 5 ML SYRINGE
INTRAMUSCULAR | Status: DC | PRN
  Administered 2017-11-04: 60 mg via INTRAVENOUS

## 2017-11-04 SURGICAL SUPPLY — 32 items
BAG DRAIN URO-CYSTO SKYTR STRL (DRAIN) ×3 IMPLANT
BAG DRN ANRFLXCHMBR STRAP LEK (BAG)
BAG DRN UROCATH (DRAIN) ×1
BAG URINE DRAINAGE (UROLOGICAL SUPPLIES) IMPLANT
BAG URINE LEG 19OZ MD ST LTX (BAG) IMPLANT
CATH FOLEY 2WAY SLVR  5CC 20FR (CATHETERS)
CATH FOLEY 2WAY SLVR  5CC 22FR (CATHETERS)
CATH FOLEY 2WAY SLVR 5CC 20FR (CATHETERS) IMPLANT
CATH FOLEY 2WAY SLVR 5CC 22FR (CATHETERS) IMPLANT
CATH FOLEY 3WAY 30CC 22F (CATHETERS) IMPLANT
CLOTH BEACON ORANGE TIMEOUT ST (SAFETY) ×3 IMPLANT
ELECT REM PT RETURN 9FT ADLT (ELECTROSURGICAL)
ELECTRODE REM PT RTRN 9FT ADLT (ELECTROSURGICAL) ×1 IMPLANT
EVACUATOR MICROVAS BLADDER (UROLOGICAL SUPPLIES) IMPLANT
GLOVE BIO SURGEON STRL SZ8 (GLOVE) ×3 IMPLANT
GOWN STRL REUS W/ TWL XL LVL3 (GOWN DISPOSABLE) ×1 IMPLANT
GOWN STRL REUS W/TWL XL LVL3 (GOWN DISPOSABLE) ×6 IMPLANT
GOWN XL W/COTTON TOWEL STD (GOWNS) ×3 IMPLANT
HOLDER FOLEY CATH W/STRAP (MISCELLANEOUS) IMPLANT
IV NS IRRIG 3000ML ARTHROMATIC (IV SOLUTION) ×5 IMPLANT
KIT TURNOVER CYSTO (KITS) ×3 IMPLANT
LOOP CUT BIPOLAR 24F LRG (ELECTROSURGICAL) ×3 IMPLANT
MANIFOLD NEPTUNE II (INSTRUMENTS) ×3 IMPLANT
NEEDLE HYPO 22GX1.5 SAFETY (NEEDLE) IMPLANT
NS IRRIG 500ML POUR BTL (IV SOLUTION) ×3 IMPLANT
PACK CYSTO (CUSTOM PROCEDURE TRAY) ×3 IMPLANT
PLUG CATH AND CAP STER (CATHETERS) IMPLANT
SYRINGE IRR TOOMEY STRL 70CC (SYRINGE) ×3 IMPLANT
TUBE CONNECTING 12'X1/4 (SUCTIONS) ×1
TUBE CONNECTING 12X1/4 (SUCTIONS) ×1 IMPLANT
WATER STERILE IRR 3000ML UROMA (IV SOLUTION) ×3 IMPLANT
WATER STERILE IRR 500ML POUR (IV SOLUTION) IMPLANT

## 2017-11-04 NOTE — Anesthesia Procedure Notes (Signed)
Procedure Name: LMA Insertion Date/Time: 11/04/2017 12:30 PM Performed by: Belinda Block, MD Pre-anesthesia Checklist: Patient identified, Emergency Drugs available, Suction available and Patient being monitored Patient Re-evaluated:Patient Re-evaluated prior to induction Oxygen Delivery Method: Circle system utilized Preoxygenation: Pre-oxygenation with 100% oxygen Induction Type: IV induction Ventilation: Mask ventilation without difficulty LMA: LMA inserted LMA Size: 4.0 Number of attempts: 1 Airway Equipment and Method: Bite block Placement Confirmation: positive ETCO2 Tube secured with: Tape Dental Injury: Teeth and Oropharynx as per pre-operative assessment

## 2017-11-04 NOTE — Op Note (Signed)
Preoperative diagnosis: Papillary lesions of bladder, possible bladder cancer  Postoperative diagnosis: No evidence of bladder lesions  Principal procedure: Cystoscopy, cauterization of vessels  Surgeon: Reo Portela  Anesthesia: General with LMA  Complications: None  Specimen: None  Estimated blood loss: None  Indications: 84 year old male with history of recurrent prostate cancer following radical prostatectomy and salvage radiotherapy.  The patient was recently started on androgen deprivation therapy.  He has had recurrent gross hematuria.  He has had several cystoscopies over the past few months with persistent raised urothelium in the trigonal region.  I worried that this could be urothelial carcinoma, as the patient has had radiotherapy.  Seeing that his recent cystoscopy revealed persistent lesions in his trigonal area, he presents this time for TURBT.  Additionally, epirubicin will be administered postoperatively.  The patient is aware of the procedure, risks and complications.  He does have a semirigid penile prosthesis and realizes there is some risk of erosion, especially with postoperative catheter placement.  Findings: Urethral meatus was mildly stenotic.  Following dilatation, the remaining urethra was normal.  Tight bladder neck contracture, passed with the resectoscope sheath/obturator with moderate difficulty.  Bladder revealed no raised lesions.  There were telangiectatic vessels prominent within the trigonal region and in the bladder neck consistent with prior radiotherapy.  There were no bladder stones.  Ureteral orifices were normal in configuration location.  Description of procedure: The patient was properly identified in the holding area.  He received preoperative IV antibiotics.  He was taken to the operating room where general anesthetic was administered with the LMA.  He was placed in the dorsolithotomy position.  Genitalia and perineum were prepped and draped.  Proper  timeout was performed.  Urethral meatus was dilated to 30 [place] with [employer] sounds.  The resectoscope sheath was passed using the visual obturator.  Some difficulty getting through a moderate bladder neck contracture.  Once again, the bladder was circumferentially inspected.  There were no papillary lesions noted consistent with prior visualization of the bladder.  There were the telangiectatic vessels noted in the bladder neck area, more prominent in the trigonal region.  Ureteral orifices were normal in configuration location.  As there are no lesions within the bladder, no biopsies were taken.  However, the vessels were cauterized with the loop.  It was difficult to get high in the trigone and higher than the distal bladder neck, as the patient did have a semirigid prosthesis.  However, all telangiectatic vessels were coagulated but were within reach.  At this point, I did not feel that a catheter needed to be placed.  The scope was then removed after the bladder was drained.  Patient was then awakened and taken to PACU in stable condition.  He tolerated the procedure well.

## 2017-11-04 NOTE — Transfer of Care (Addendum)
  Last Vitals:  Vitals Value Taken Time  BP 100/61 11/04/2017 12:57 PM  Temp    Pulse 69 11/04/2017  1:01 PM  Resp    SpO2 94 % 11/04/2017  1:01 PM  Vitals shown include unvalidated device data.  Last Pain:  Vitals:   11/04/17 1054  TempSrc:   PainSc: 0-No pain      Patients Stated Pain Goal: 8 (11/04/17 1054) Immediate Anesthesia Transfer of Care Note  Patient: [place]  Procedure(s) Performed: Procedure(s) (LRB): CYSTOSCOPY AND CAUTERIZATION OF BLADDER (N/A)  Patient Location: PACU  Anesthesia Type: General  Level of Consciousness: awake, alert  and oriented  Airway & Oxygen Therapy: Patient Spontanous Breathing and Patient connected to nasal cannula oxygen  Post-op Assessment: Report given to PACU RN and Post -op Vital signs reviewed and stable  Post vital signs: Reviewed and stable  Complications: No apparent anesthesia complications

## 2017-11-04 NOTE — Discharge Instructions (Signed)
1. You may see some blood in the urine and may have some burning with urination for 48-72 hours. You also may notice that you have to urinate more frequently or urgently after your procedure which is normal.  2. You should call should you develop an inability urinate, fever > 101, persistent nausea and vomiting that prevents you from eating or drinking to stay hydrated.  3. If you have a stent, you will likely urinate more frequently and urgently until the stent is removed and you may experience some discomfort/pain in the lower abdomen and flank especially when urinating. You may take pain medication prescribed to you if needed for pain. You may also intermittently have blood in the urine until the stent is removed. 4. If you have a catheter, you will be taught how to take care of the catheter by the nursing staff prior to discharge from the hospital.  You may periodically feel a strong urge to void with the catheter in place.  This is a bladder spasm and most often can occur when having a bowel movement or moving around. It is typically self-limited and usually will stop after a few minutes.  You may use some Vaseline or Neosporin around the tip of the catheter to reduce friction at the tip of the penis. You may also see some blood in the urine.  A very small amount of blood can make the urine look quite red.  As long as the catheter is draining well, there usually is not a problem.  However, if the catheter is not draining well and is bloody, you should call the office [phone]) to notify us.   CYSTOSCOPY HOME CARE INSTRUCTIONS  Activity: Rest for the remainder of the day.  Do not drive or operate equipment today.  You may resume normal activities in one to two days as instructed by your physician.   Meals: Drink plenty of liquids and eat light foods such as gelatin or soup this evening.  You may return to a normal meal plan tomorrow.  Return to Work: You may return to work in one to two days  or as instructed by your physician.  Special Instructions / Symptoms: Call your physician if any of these symptoms occur:   -persistent or heavy bleeding  -bleeding which continues after first few urination  -large blood clots that are difficult to pass  -urine stream diminishes or stops completely  -fever equal to or higher than 101 degrees Farenheit.  -cloudy urine with a strong, foul odor  -severe pain  Females should always wipe from front to back after elimination.  You may feel some burning pain when you urinate.  This should disappear with time.  Applying moist heat to the lower abdomen or a hot tub bath may help relieve the pain. \  Follow-Up / Date of Return Visit to Your Physician: Call for an appointment to arrange follow-up.  Patient Signature:  ________________________________________________________  Nurse's Signature:  ________________________________________________________   Post Anesthesia Home Care Instructions  Activity: Get plenty of rest for the remainder of the day. A responsible individual must stay with you for 24 hours following the procedure.  For the next 24 hours, DO NOT: -Drive a car -Paediatric nurse -Drink alcoholic beverages -Take any medication unless instructed by your physician -Make any legal decisions or sign important papers.  Meals: Start with liquid foods such as gelatin or soup. Progress to regular foods as tolerated. Avoid greasy, spicy, heavy foods. If nausea and/or vomiting occur, drink  only clear liquids until the nausea and/or vomiting subsides. Call your physician if vomiting continues.  Special Instructions/Symptoms: Your throat may feel dry or sore from the anesthesia or the breathing tube placed in your throat during surgery. If this causes discomfort, gargle with warm salt water. The discomfort should disappear within 24 hours.  If you had a scopolamine patch placed behind your ear for the management of post- operative  nausea and/or vomiting:  1. The medication in the patch is effective for 72 hours, after which it should be removed.  Wrap patch in a tissue and discard in the trash. Wash hands thoroughly with soap and water. 2. You may remove the patch earlier than 72 hours if you experience unpleasant side effects which may include dry mouth, dizziness or visual disturbances. 3. Avoid touching the patch. Wash your hands with soap and water after contact with the patch.

## 2017-11-04 NOTE — Anesthesia Preprocedure Evaluation (Addendum)
Anesthesia Evaluation  Patient identified by MRN, date of birth, ID band Patient awake    Reviewed: Allergy & Precautions, NPO status , Patient's Chart, lab work & pertinent test results  Airway Mallampati: II  TM Distance: >3 FB     Dental   Pulmonary asthma , pneumonia, former smoker,    breath sounds clear to auscultation       Cardiovascular hypertension, + CAD and +CHF  + dysrhythmias + pacemaker  Rhythm:Regular Rate:Normal     Neuro/Psych    GI/Hepatic negative GI ROS, Neg liver ROS,   Endo/Other  diabetes  Renal/GU negative Renal ROS     Musculoskeletal   Abdominal   Peds  Hematology   Anesthesia Other Findings   Reproductive/Obstetrics                             Anesthesia Physical Anesthesia Plan  ASA: III  Anesthesia Plan: General   Post-op Pain Management:    Induction: Intravenous  PONV Risk Score and Plan: 2 and Treatment may vary due to age or medical condition  Airway Management Planned: LMA  Additional Equipment:   Intra-op Plan:   Post-operative Plan: Extubation in OR  Informed Consent: I have reviewed the patients History and Physical, chart, labs and discussed the procedure including the risks, benefits and alternatives for the proposed anesthesia with the patient or authorized representative who has indicated his/her understanding and acceptance.   Dental advisory given  Plan Discussed with: CRNA and Anesthesiologist  Anesthesia Plan Comments:        Anesthesia Quick Evaluation

## 2017-11-04 NOTE — H&P (Signed)
Chief Complaint: Bladder cancer  History of Present Illness: 84 year old male with recurrent prostate cancer, currently on androgen deprivation therapy.  He presented earlier this year with gross hematuria.  Evaluation revealed the patient to have papillary tumors, 3 cm in size, on the posterior bladder wall.  He presents at this time for TURBT/placement of epirubicin.  Past Medical History:  Diagnosis Date  . Asthma 1950's   history of  . Atrial fibrillation ([place])   . Bilateral carpal tunnel syndrome   . Bilateral lower extremity edema   . Bladder tumor   . Chronic systolic heart failure (HCC)    Echo 1/19: Mild LVH, EF 45-50, inf HK, MAC, severe LAE, severe RAE // Echo 7/15: Mild LVH, mod focal basal sept hypertrophy, EF 55-60, AV peak and mean 16/9, trivial MR, mod LAE, PASP 38  . CLL (chronic lymphocytic leukemia) [facility]) oncologist-  dr Ilene Qua--   dx [phone] ;  Lymphocytosis, CLL - per lov note 05-11-2017 currently under active survillance,  CT 04-17-2014 show very small lymphadenopathy, no indication for treatment  . Coronary artery disease    cardiologist-  dr Cathie Olden--  08-18-2017 Intermittant risk nuclear study w/ large area of inferior infartion with no evidence ishcemia   . 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 Diabetes mellitus type 2, noninsulin dependent ([place])   . Elevated PSA    since prostatectomy but now resolved  . Hematuria 04/2017  . History of ear infection    Right  . History of MI (myocardial infarction)    per myoview nuclear study 08-18-2017 , unknown when  . History of shingles 08/2017   L ear and scalp, possible  . Hyperlipidemia   . Hypertension   . Ischemic cardiomyopathy 09/01/2017   Presumed +CAD with Nuclear stress test 08/18/17 - Inferior scar, no ischemia, intermediate risk // med management unless +angina or worse dyspnea  . OA (osteoarthritis)   . Pacemaker 02/08/2014   followed by dr g. taylor--  single chamber Biotronik due to SSS  . Permanent atrial  fibrillation ([place])   . Pneumonia 2019   Left lung  . Prostate cancer [facility]) urologist-  dr Diona Fanti   dx 2004--  Gleason 8, PSA 10.45--  11-28-2002  s/p  radical prostatectomy;  recurrent w/ increasing PSA, started ADT treatment  . RBBB (right bundle branch block)   . Sick sinus syndrome ([place])    a-Flutter with episodes of bradycardia; S/P Biotronik (serial number [id number]) 02-08-2014  . Urinary incontinence   . Wears hearing aid in right ear    receiver and transmitter    Past Surgical History:  Procedure Laterality Date  . APPENDECTOMY    . BACK SURGERY     disk  . CARDIAC CATHETERIZATION  09-03-1999  dr Cathie Olden   abnormal cardiolite study:  minor luminal irregularities but no critial coronary artery stenosis  . CARDIOVASCULAR STRESS TEST  08-18-2017  dr Cathie Olden   Intermediate risk nuclear study w/ large area inferior infarction, no evidence of ishcemia (consistant w/ prior MI)/  study not gated due to frequent PVCs  . CARPAL TUNNEL RELEASE Right 2000  . CARPAL TUNNEL RELEASE Left 11/19/2009  . CATARACT EXTRACTION Right 07/2015  . CATARACT EXTRACTION Left 09/2015  . INSERTION PENILE PROSTHESIS  02-22-2004    dr Mattie Marlin  [facility]  . KNEE ARTHROSCOPY Left 07/2010  . PERMANENT PACEMAKER INSERTION N/A 02/08/2014   Procedure: PERMANENT PACEMAKER INSERTION;  Surgeon: Evans Lance, MD;  Location: [facility] CATH LAB;  Service: Cardiovascular;  Laterality: N/A;  . RADICAL RETROPUBIC PROSTATECTOMY W/ BILATERAL PELVIC LYMPH NODE DISSECTION  11-28-2002   dr Mattie Marlin  [facility]  . TONSILLECTOMY    . TOTAL HIP ARTHROPLASTY Left 05/12/2016   Procedure: LEFT TOTAL HIP ARTHROPLASTY ANTERIOR APPROACH;  Surgeon: Paralee Cancel, MD;  Location: [facility]S;  Service: Orthopedics;  Laterality: Left;  . TOTAL HIP ARTHROPLASTY Right 07-15-2006   dr Alvan Dame  [facility]  . TRANSTHORACIC ECHOCARDIOGRAM  07/20/2017   mild LVH, ef 45-50%, hypokinesis of the basal-midinferior myocardium, due to AFib unable to evaluate diastolic function/   severe LAE and RAE/  trivial PR and TR    Home Medications:  Allergies as of 11/04/2017      Reactions   Altace [ramipril] Other (See Comments)   "throat felt like had a knot in it"   Codeine Nausea And Vomiting   Nausea and vomiting   Simvastatin Other (See Comments)   Leg aches      Medication List    Notice   Cannot display discharge medications because the patient has not yet been admitted.     Allergies:  Allergies  Allergen Reactions  . Altace [Ramipril] Other (See Comments)    "throat felt like had a knot in it"  . Codeine Nausea And Vomiting    Nausea and vomiting   . Simvastatin Other (See Comments)    Leg aches    Family History  Problem Relation Age of Onset  . Emphysema Mother   . Allergic rhinitis Mother   . Heart attack Father   . Allergic rhinitis Brother   . Pulmonary fibrosis Brother     Social History:  reports that he quit smoking about 41 years ago. His smoking use included pipe and cigars. He quit after 25.00 years of use. He has never used smokeless tobacco. He reports that he drinks alcohol. He reports that he does not use drugs.  ROS: A complete review of systems was performed.  All systems are negative except for pertinent findings as noted.  Physical Exam:  Vital signs in last 24 hours:   Constitutional:  Alert and oriented, No acute distress Cardiovascular: Regular rate and rhythm, No JVD Respiratory: Normal respiratory effort, Lungs clear bilaterally GI: Abdomen is soft, nontender, nondistended, no abdominal masses Genitourinary: No CVAT. Normal male phallus, testes are descended bilaterally and non-tender and without masses, scrotum is normal in appearance without lesions or masses, perineum is normal on inspection.  Semirigid penile prosthesis present Rectal: Normal sphincter tone, no rectal masses, prostate is non tender and without nodularity. Prostate  Lymphatic: No lymphadenopathy Neurologic: Grossly intact, no focal  deficits Psychiatric: Normal mood and affect  Laboratory Data:  No results for input(s): WBC, HGB, HCT, PLT in the last 72 hours.  No results for input(s): NA, K, CL, GLUCOSE, BUN, CALCIUM, CREATININE in the last 72 hours.  Invalid input(s): CO3   No results found for this or any previous visit (from the past 24 hour(s)). No results found for this or any previous visit (from the past 240 hour(s)).  Renal Function: No results for input(s): CREATININE in the last 168 hours. CrCl cannot be calculated (Patient's most recent lab result is older than the maximum 21 days allowed.).  Radiologic Imaging: No results found.  Impression/Assessment:  Bladder cancer, 3 cm in size  Plan:  TURBT, placement of epirubicin postoperatively

## 2017-11-05 ENCOUNTER — Encounter (HOSPITAL_BASED_OUTPATIENT_CLINIC_OR_DEPARTMENT_OTHER): Payer: Self-pay | Admitting: Urology

## 2017-11-05 NOTE — Anesthesia Postprocedure Evaluation (Signed)
Anesthesia Post Note  Patient: Oscar Baker  Procedure(s) Performed: CYSTOSCOPY AND CAUTERIZATION OF BLADDER (N/A )     Anesthesia Post Evaluation  Last Vitals:  Vitals:   11/04/17 1345 11/04/17 1455  BP: 118/75 129/72  Pulse: 66 63  Resp: 17 (!) 22  Temp:  36.6 C  SpO2: 98% 100%    Last Pain:  Vitals:   11/04/17 1600  TempSrc:   PainSc: 0-No pain                 Vedder Brittian

## 2017-11-08 ENCOUNTER — Encounter: Payer: Self-pay | Admitting: Physical Therapy

## 2017-11-08 ENCOUNTER — Ambulatory Visit: Payer: Medicare Other | Admitting: Physical Therapy

## 2017-11-08 DIAGNOSIS — R2681 Unsteadiness on feet: Secondary | ICD-10-CM

## 2017-11-08 DIAGNOSIS — M6281 Muscle weakness (generalized): Secondary | ICD-10-CM

## 2017-11-08 DIAGNOSIS — R2689 Other abnormalities of gait and mobility: Secondary | ICD-10-CM | POA: Diagnosis not present

## 2017-11-08 DIAGNOSIS — R296 Repeated falls: Secondary | ICD-10-CM | POA: Diagnosis not present

## 2017-11-08 NOTE — Patient Instructions (Addendum)
Perform these in a corner with a chair in front of you for safety:  Feet Apart (Compliant Surface) Varied Arm Positions - Eyes Closed    Stand on compliant surface: _pillow/s_ with feet shoulder width apart and arms at sides. Close eyes and visualize upright position. Hold_30_ seconds. Repeat _3_ times per session. Do _1-2_ sessions per day.  Copyright  VHI. All rights reserved.   Feet Apart (Compliant Surface) Head Motion - Eyes Closed    Stand on compliant surface: _pillow/s_ with feet shoulder width apart. Close eyes and move head slowly: 1. Up and down x 10 reps 2. Left and right x 10 reps 3. Up at ceiling toward right/down at floor toward left x 10 reps (diagonal movement) 4. Up at ceiling toward left/down at floor toward right x 10 reps (diagonal movement)  Do _1-2_ sessions per day.  Copyright  VHI. All rights reserved.    Single Leg - Eyes Open    Holding support, lift right leg while maintaining balance over other leg. Progress to removing hands from support surface for longer periods of time. Hold_30_ seconds. Repeat _3_ times each leg per session. Do _1-2_ sessions per day.  Copyright  VHI. All rights reserved.

## 2017-11-08 NOTE — Therapy (Signed)
[facility] [address] [place], [name], [address] Phone: [phone]   Fax:  [phone]  Physical Therapy Treatment  Patient Details  Name: Oscar Baker MRN: [id number] Date of Birth: [birth year] Referring Provider: [name], MD   Encounter Date: 11/08/2017  PT End of Session - 11/08/17 0850    Visit Number  6    Number of Visits  25    Date for PT Re-Evaluation  01/18/18    Authorization Type  Medicare & BCBS supplement    Authorization Time Period  medicare guidelines with medical necessity; 100% coverage    PT Start Time  0847    PT Stop Time  0930    PT Time Calculation (min)  43 min    Equipment Utilized During Treatment  Gait belt    Activity Tolerance  Patient tolerated treatment well    Behavior During Therapy  [facility] for tasks assessed/performed       Past Medical History:  Diagnosis Date  . Asthma 1950's   history of  . Atrial fibrillation ([place])   . Bilateral carpal tunnel syndrome   . Bilateral lower extremity edema   . Bladder tumor   . Chronic systolic heart failure (HCC)    Echo 1/19: Mild LVH, EF 45-50, inf HK, MAC, severe LAE, severe RAE // Echo 7/15: Mild LVH, mod focal basal sept hypertrophy, EF 55-60, AV peak and mean 16/9, trivial MR, mod LAE, PASP 38  . CLL (chronic lymphocytic leukemia) [facility]) oncologist-  dr Ilene Qua--   dx [phone] ;  Lymphocytosis, CLL - per lov note 05-11-2017 currently under active survillance,  CT 04-17-2014 show very small lymphadenopathy, no indication for treatment  . Coronary artery disease    cardiologist-  dr Cathie Olden--  08-18-2017 Intermittant risk nuclear study w/ large area of inferior infartion with no evidence ishcemia   . 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 Diabetes mellitus type 2, noninsulin dependent ([place])   . Elevated PSA    since prostatectomy but now resolved  . Hematuria 04/2017  . History of ear infection    Right  . History of MI (myocardial infarction)     per myoview nuclear study 08-18-2017 , unknown when  . History of shingles 08/2017   L ear and scalp, possible  . Hyperlipidemia   . Hypertension   . Ischemic cardiomyopathy 09/01/2017   Presumed +CAD with Nuclear stress test 08/18/17 - Inferior scar, no ischemia, intermediate risk // med management unless +angina or worse dyspnea  . OA (osteoarthritis)   . Pacemaker 02/08/2014   followed by dr g. taylor--  single chamber Biotronik due to SSS  . Permanent atrial fibrillation ([place])   . Pneumonia 2019   Left lung  . Prostate cancer [facility]) urologist-  dr Diona Fanti   dx 2004--  Gleason 8, PSA 10.45--  11-28-2002  s/p  radical prostatectomy;  recurrent w/ increasing PSA, started ADT treatment  . RBBB (right bundle branch block)   . Sick sinus syndrome ([place])    a-Flutter with episodes of bradycardia; S/P Biotronik (serial number [id number]) 02-08-2014  . Urinary incontinence   . Wears hearing aid in right ear    receiver and transmitter    Past Surgical History:  Procedure Laterality Date  . APPENDECTOMY    . BACK SURGERY     disk  . CARDIAC CATHETERIZATION  09-03-1999  dr Cathie Olden   abnormal cardiolite study:  minor luminal irregularities but no critial coronary artery stenosis  .  CARDIOVASCULAR STRESS TEST  08-18-2017  dr Cathie Olden   Intermediate risk nuclear study w/ large area inferior infarction, no evidence of ishcemia (consistant w/ prior MI)/  study not gated due to frequent PVCs  . CARPAL TUNNEL RELEASE Right 2000  . CARPAL TUNNEL RELEASE Left 11/19/2009  . CATARACT EXTRACTION Right 07/2015  . CATARACT EXTRACTION Left 09/2015  . CYSTOSCOPY N/A 11/04/2017   Procedure: CYSTOSCOPY AND CAUTERIZATION OF BLADDER;  Surgeon: Franchot Gallo, MD;  Location: [facility];  Service: Urology;  Laterality: N/A;  . INSERTION PENILE PROSTHESIS  02-22-2004    dr Mattie Marlin  [facility]  . KNEE ARTHROSCOPY Left 07/2010  . PERMANENT PACEMAKER INSERTION N/A 02/08/2014   Procedure: PERMANENT  PACEMAKER INSERTION;  Surgeon: Evans Lance, MD;  Location: [facility] CATH LAB;  Service: Cardiovascular;  Laterality: N/A;  . RADICAL RETROPUBIC PROSTATECTOMY W/ BILATERAL PELVIC LYMPH NODE DISSECTION  11-28-2002   dr Mattie Marlin  [facility]  . TONSILLECTOMY    . TOTAL HIP ARTHROPLASTY Left 05/12/2016   Procedure: LEFT TOTAL HIP ARTHROPLASTY ANTERIOR APPROACH;  Surgeon: Paralee Cancel, MD;  [facility];  Service: Orthopedics;  Laterality: Left;  . TOTAL HIP ARTHROPLASTY Right 07-15-2006   dr Alvan Dame  [facility]  . TRANSTHORACIC ECHOCARDIOGRAM  07/20/2017   mild LVH, ef 45-50%, hypokinesis of the basal-midinferior myocardium, due to AFib unable to evaluate diastolic function/  severe LAE and RAE/  trivial PR and TR    There were no vitals filed for this visit.  Subjective Assessment - 11/08/17 0849    Subjective  No new complaints. No falls or pain to report. Spouse has emailed pictures of the gym near them with equipement available.     Pertinent History  Left THA 05/12/16, right THA 2008, CHF, MI, DM2, HTN, OA, prostate CA, shingles, A-fib, hearing loss, knee sg,     Limitations  Lifting;Standing;House hold activities;Walking    Patient Stated Goals  to get off walker, be able to go out community    Currently in Pain?  No/denies    Pain Score  0-No pain         [facility] Adult PT Treatment/Exercise - 11/08/17 0855      Ambulation/Gait   Ambulation/Gait  Yes    Ambulation/Gait Assistance  4: Min guard;4: Min assist;3: Mod assist    Ambulation/Gait Assistance Details  gait with cane: min guard initially increasing to mod assist as distance progressed due to increased right lateral lean/increased postural sway.     Ambulation Distance (Feet)  115 Feet x1, plus around gym    Assistive device  Straight cane with rubber quad tip    Gait Pattern  Step-through pattern;Decreased arm swing - left;Decreased step length - left;Decreased stance time - right;Decreased stride length;Lateral hip instability;Trunk  flexed;Wide base of support    Ambulation Surface  Level;Indoor          Balance Exercises - 11/08/17 0929      Balance Exercises: Standing   SLS with Vectors  Solid surface;Foam/compliant surface;Other reps (comment);Limitations      Balance Exercises: Standing   SLS with Vectors Limitations  on floor with 3 foam bubbles in semi-circle pattern in front of him: single leg stance with toe taps to each for 3 laps with each leg. min assist for balance, no UE support. cues on posture, stance position and weight shifting to assist with balance; standing on 1 inch foam with 2 foam bubbles in front: alternating fwd toe taps to each x 5  reps each leg with min to mod assist.       advanced corner balance HEP to the following with min guard assist in session today, cues on posture and weight shifting to assist with balance. Perform these in a corner with a chair in front of you for safety:  Feet Apart (Compliant Surface) Varied Arm Positions - Eyes Closed    Stand on compliant surface: _pillow/s_ with feet shoulder width apart and arms at sides. Close eyes and visualize upright position. Hold_30_ seconds. Repeat _3_ times per session. Do _1-2_ sessions per day.  Copyright  VHI. All rights reserved.   Feet Apart (Compliant Surface) Head Motion - Eyes Closed    Stand on compliant surface: _pillow/s_ with feet shoulder width apart. Close eyes and move head slowly: 1. Up and down x 10 reps 2. Left and right x 10 reps 3. Up at ceiling toward right/down at floor toward left x 10 reps (diagonal movement) 4. Up at ceiling toward left/down at floor toward right x 10 reps (diagonal movement)  Do _1-2_ sessions per day.  Copyright  VHI. All rights reserved.    Single Leg - Eyes Open    Holding support, lift right leg while maintaining balance over other leg. Progress to removing hands from support surface for longer periods of time. Hold_30_ seconds. Repeat _3_ times each leg per  session. Do _1-2_ sessions per day.  Copyright  VHI. All rights reserved.    PT Education - 11/08/17 0919    Education provided  Yes    Education Details  advanced corner balance HEP    Person(s) Educated  Patient    Methods  Explanation;Demonstration;Verbal cues;Handout    Comprehension  Need further instruction;Verbalized understanding;Returned demonstration       PT Short Term Goals - 10/21/17 1248      PT SHORT TERM GOAL #1   Title  Patient demonstrates understanding of initial HEP. (All STGs Target Date: 11/19/2017)    Time  1    Period  Months    Status  New    Target Date  11/19/17      PT SHORT TERM GOAL #2   Title  Patient reaches 5" anteriorly and looks over shoulders with trunk rotation without UE support with supervision.     Time  1    Period  Months    Status  New    Target Date  11/19/17      PT SHORT TERM GOAL #3   Title  Patient ambulates 300' with cane with minA scanning environment & negotiating around obstacles.     Time  1    Period  Months    Status  New    Target Date  11/19/17      PT SHORT TERM GOAL #4   Title  Patient negotiates ramps, curbs and stairs with 1 rail with cane with minA.    Time  1    Period  Months    Status  New    Target Date  11/19/17        PT Long Term Goals - 10/21/17 1242      PT LONG TERM GOAL #1   Title  Patient verbalizes & demonstrates ongoing HEP / fitness plan. (All LTGs Target Date: 01/14/2018)    Time  12    Period  Weeks    Status  New    Target Date  01/14/18      PT LONG TERM GOAL #2  Title  Berg Balance >45/56 to indicate lower fall risk.     Time  12    Period  Weeks    Status  New    Target Date  01/14/18      PT LONG TERM GOAL #3   Title  Timed Up-Go with cane or less <13.5sec independently.     Time  12    Period  Weeks    Status  New    Target Date  01/14/18      PT LONG TERM GOAL #4   Title  Patient ambulates 500' outdoors including grass with cane or less modified independent.       Time  12    Period  Weeks    Status  New    Target Date  01/14/18      PT LONG TERM GOAL #5   Title  Patient negotiates ramps, curbs with cane or less and stairs with single rail modified independent.     Time  12    Period  Weeks    Status  New    Target Date  01/14/18            Plan - 11/08/17 0850    Clinical Impression Statement  Today's skilled session continued to focus on gait with cane and balance reactions. Advanced pt's corner balance HEP today without any issues noted. Also reviewed the pictures of the gym his spouse sent via email- pt is to continue with the stationary bike and begin using the UE pully system. He is to check and see if the seated LE weight machine seat is adjustable to make it easier for him to get onto/off of. The pt is progressing toward goals and should benefit from continued PT to progress toward unmet goals.                          Rehab Potential  Good    PT Frequency  2x / week    PT Duration  12 weeks    PT Treatment/Interventions  ADLs/Self Care Home Management;DME Instruction;Gait training;Stair training;Functional mobility training;Therapeutic exercise;Therapeutic activities;Balance training;Neuromuscular re-education;Patient/family education;Vestibular;Canalith Repostioning    PT Next Visit Plan  continue to address balance deficites, continue to gait train with cane    Consulted and Agree with Plan of Care  Patient;Family member/caregiver    Family Member Consulted  wife, Kristapher [place]       Patient will benefit from skilled therapeutic intervention in order to improve the following deficits and impairments:  Abnormal gait, Decreased activity tolerance, Decreased balance, Decreased endurance, Decreased coordination, Decreased knowledge of use of DME, Decreased mobility, Decreased strength, Postural dysfunction  Visit Diagnosis: Muscle weakness (generalized)  Unsteadiness on feet  Other abnormalities of gait and mobility  Repeated  falls     Problem List Patient Active Problem List   Diagnosis Date Noted  . Ischemic cardiomyopathy 09/01/2017  . Right hip pain   . Acute otitis media   . Chronic systolic CHF (congestive heart failure) ([place])   . Pressure injury of skin 07/20/2017  . Malignant otitis externa 07/19/2017  . Fall at home, initial encounter 07/19/2017  . S/P left THA, AA 05/12/2016  . LPRD (laryngopharyngeal reflux disease) 05/21/2015  . Other allergic rhinitis 05/21/2015  . Obesity 12/12/2014  . Upper airway cough syndrome 12/11/2014  . Cough variant asthma 07/27/2014  . Wheezing 06/20/2014  . Symptomatic bradycardia - s/p Biotronik (serial number [id number]) 02/08/2014  .  Diabetes mellitus type 2, noninsulin dependent ([place])   . Edema of foot 03/06/2013  . Traumatic ecchymosis of right foot 03/06/2013  . Bone spur 12/30/2012  . Pain of toe of left foot 12/30/2012  . Hyperlipidemia 07/13/2012  . HTN (hypertension) 04/12/2012  . Sick sinus syndrome ([place]) 08/17/2011  . Permanent atrial fibrillation ([place]) 09/16/2010   Willow Ora, PTA, [facility] [address] [employer] [place], [place] [address] [phone] 11/08/17, 11:47 AM   Name: Oscar Baker MRN: [id number] Date of Birth: Aug 22, 1932

## 2017-11-09 ENCOUNTER — Inpatient Hospital Stay: Payer: Medicare Other | Attending: Oncology | Admitting: Oncology

## 2017-11-09 ENCOUNTER — Inpatient Hospital Stay: Payer: Medicare Other

## 2017-11-09 VITALS — BP 105/54 | HR 71 | Temp 97.9°F | Resp 18 | Ht 70.0 in | Wt 214.2 lb

## 2017-11-09 DIAGNOSIS — R232 Flushing: Secondary | ICD-10-CM | POA: Diagnosis not present

## 2017-11-09 DIAGNOSIS — Z7984 Long term (current) use of oral hypoglycemic drugs: Secondary | ICD-10-CM | POA: Diagnosis not present

## 2017-11-09 DIAGNOSIS — Z79818 Long term (current) use of other agents affecting estrogen receptors and estrogen levels: Secondary | ICD-10-CM

## 2017-11-09 DIAGNOSIS — C61 Malignant neoplasm of prostate: Secondary | ICD-10-CM | POA: Diagnosis not present

## 2017-11-09 DIAGNOSIS — C9110 Chronic lymphocytic leukemia of B-cell type not having achieved remission: Secondary | ICD-10-CM | POA: Diagnosis not present

## 2017-11-09 DIAGNOSIS — Z79899 Other long term (current) drug therapy: Secondary | ICD-10-CM | POA: Diagnosis not present

## 2017-11-09 DIAGNOSIS — H6122 Impacted cerumen, left ear: Secondary | ICD-10-CM | POA: Diagnosis not present

## 2017-11-09 DIAGNOSIS — C9190 Lymphoid leukemia, unspecified not having achieved remission: Secondary | ICD-10-CM

## 2017-11-09 DIAGNOSIS — Z7901 Long term (current) use of anticoagulants: Secondary | ICD-10-CM | POA: Diagnosis not present

## 2017-11-09 DIAGNOSIS — Z9181 History of falling: Secondary | ICD-10-CM | POA: Insufficient documentation

## 2017-11-09 LAB — CBC WITH DIFFERENTIAL/PLATELET
BASOS ABS: 0.3 10*3/uL — AB (ref 0.0–0.1)
BASOS PCT: 1 %
EOS ABS: 1 10*3/uL — AB (ref 0.0–0.5)
Eosinophils Relative: 3 %
HEMATOCRIT: 36.8 % — AB (ref 38.4–49.9)
HEMOGLOBIN: 11.9 g/dL — AB (ref 13.0–17.1)
Lymphocytes Relative: 79 %
Lymphs Abs: 23.5 10*3/uL — ABNORMAL HIGH (ref 0.9–3.3)
MCH: 33.3 pg (ref 27.2–33.4)
MCHC: 32.3 g/dL (ref 32.0–36.0)
MCV: 103.1 fL — ABNORMAL HIGH (ref 79.3–98.0)
Monocytes Absolute: 0.5 10*3/uL (ref 0.1–0.9)
Monocytes Relative: 2 %
NEUTROS ABS: 4.3 10*3/uL (ref 1.5–6.5)
Neutrophils Relative %: 15 %
Platelets: 255 10*3/uL (ref 140–400)
RBC: 3.57 MIL/uL — ABNORMAL LOW (ref 4.20–5.82)
RDW: 20 % — ABNORMAL HIGH (ref 11.0–14.6)
WBC: 29.6 10*3/uL — ABNORMAL HIGH (ref 4.0–10.3)

## 2017-11-09 LAB — COMPREHENSIVE METABOLIC PANEL
ALBUMIN: 3.9 g/dL (ref 3.5–5.0)
ALK PHOS: 58 U/L (ref 40–150)
ALT: 16 U/L (ref 0–55)
AST: 23 U/L (ref 5–34)
Anion gap: 8 (ref 3–11)
BILIRUBIN TOTAL: 0.4 mg/dL (ref 0.2–1.2)
BUN: 20 mg/dL (ref 7–26)
CALCIUM: 9.3 mg/dL (ref 8.4–10.4)
CO2: 25 mmol/L (ref 22–29)
CREATININE: 1.04 mg/dL (ref 0.70–1.30)
Chloride: 106 mmol/L (ref 98–109)
GFR calc Af Amer: >60 mL/min (ref >60)
GFR calc non Af Amer: >60 mL/min (ref >60)
GLUCOSE: 175 mg/dL — AB (ref 70–140)
Potassium: 4.3 mmol/L (ref 3.5–5.1)
SODIUM: 139 mmol/L (ref 136–145)
Total Protein: 6.5 g/dL (ref 6.4–8.3)

## 2017-11-09 NOTE — Progress Notes (Signed)
Hematology and Oncology Follow Up Visit  Oscar Baker [id number] 02/19/1933 84 y.o. 11/09/2017 8:43 AM Oscar Baker, MDRusso, Oscar Reichmann, MD   Principle Diagnosis: 84 year old man with:  1.  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 He presented with leukocytosis with no significant adenopathy.  2.  Prostate cancer presented with a Gleason score 4+4 = 8 and underwent a prostatectomy in 2004.  He developed biochemical relapse in 2018 with a PSA of 3.  Current therapy:   Observation and surveillance for CLL.    He is currently on Lupron every 4 months under the care of Dr. Diona Fanti for prostate cancer.  Interim History:  Oscar Baker is here for a follow-up.  Since the last visit, he was hospitalized in early January of this year after presented with a fall and presented with ear infection and sinus congestion.  He has recovered reasonably well after that episode and has not had any falls after while using a walker.  He still ambulates with the help of a walker without any difficulties.  He also had a cystoscopy under the care of Dr. Diona Fanti on Nov 04, 2017.  He does report some hot flashes associated with Lupron but no other complications.  He denies any lymphadenopathy or easy bruising.  He denies any weight loss or excessive fatigue.  He denies any recurrent infections.  He continues to attend activities of daily living without any decline.   He does not report any headaches or blurry vision or syncope.  He denies any fevers or chills or sweats.  He does not report any chest pain, palpitation or shortness of breath.  He denies any cough, wheezing or hemoptysis.  He does not report any nausea, vomiting, abdominal pain. He does not report any frequency urgency or hesitancy.  He did have an episode of hematuria resolved.  He does not report any arthralgias or myalgias.  He does not report any petechiae or rash.  Rest of his review of systems is negative.  Medications: I have reviewed the patient's  current medications.  Current Outpatient Medications  Medication Sig Dispense Refill  . acetaminophen (TYLENOL) 500 MG tablet Take 1,000 mg by mouth as needed for mild pain.    [name] atorvastatin (LIPITOR) 20 MG tablet TAKE 1 TABLET BY MOUTH DAILY AT 6PM 90 tablet 0  . carvedilol (COREG) 3.125 MG tablet Take 1 tablet (3.125 mg total) by mouth 2 (two) times daily. 180 tablet 3  . fenofibrate micronized (LOFIBRA) 200 MG capsule TAKE 1 CAPSULE BY MOUTH DAILY BEFORE BREAKFAST 90 capsule 3  . furosemide (LASIX) 20 MG tablet Take 1 tablet (20 mg total) by mouth daily as needed. 30 tablet 6  . glipiZIDE (GLUCOTROL XL) 2.5 MG 24 hr tablet Take 2.5 mg by mouth as needed (FOR DIABETES).     [name] JANUVIA 100 MG tablet Take 100 mg by mouth at bedtime.   2  . metFORMIN (GLUCOPHAGE) 1000 MG tablet Take 1,000 mg by mouth 2 (two) times daily with a meal.      . multivitamin (THERAGRAN) per tablet Take 1 tablet by mouth daily. Will stop prior to procedure    . vitamin B-12 (CYANOCOBALAMIN) 1000 MCG tablet Take 1,000 mcg by mouth daily. Will stop prior to procedure    . warfarin (COUMADIN) 5 MG tablet TAKE AS DIRECTED BY COUMADIN CLINIC 90 tablet 1   No current facility-administered medications for this visit.      Allergies:  Allergies  Allergen Reactions  . Altace [  Ramipril] Other (See Comments)    "throat felt like had a knot in it"  . Codeine Nausea And Vomiting    Nausea and vomiting   . Simvastatin Other (See Comments)    Leg aches    Past Medical History, Surgical history, Social history, and Family History reviewed and updated today.   Physical Exam:  ECOG: 0 General appearance: Well-appearing gentleman without distress.. Head: Atraumatic without abnormalities. Eyes: No scleral icterus. Oropharynx: Without any thrush or ulcers. Lymph nodes: No lymphadenopathy noted cervical, supraclavicular, and axillary nodes  Heart: Regular rate and rhythm without any murmurs or gallops. Lung: There to  auscultation without any rhonchi, wheezes or dullness to percussion. Abdomin: Soft, nontender without any rebound or guarding.  Good bowel sounds. Musculoskeletal: No joint deformity or effusion. Skin: No rashes or lesions.   Lab Results: Lab Results  Component Value Date   WBC 61.0 (HH) 07/23/2017   HGB 12.9 (L) 11/04/2017   HCT 38.0 (L) 11/04/2017   MCV 94.7 07/23/2017   PLT 511 (H) 07/23/2017     Chemistry      Component Value Date/Time   NA 143 11/04/2017 1033   NA 139 05/11/2017 0831   K 4.3 11/04/2017 1033   K 4.6 05/11/2017 0831   CL 105 11/04/2017 1033   CO2 26 07/23/2017 0503   CO2 23 05/11/2017 0831   BUN 16 11/04/2017 1033   BUN 21.1 05/11/2017 0831   CREATININE 0.80 11/04/2017 1033   CREATININE 1.0 05/11/2017 0831      Component Value Date/Time   CALCIUM 8.0 (L) 07/23/2017 0503   CALCIUM 9.0 05/11/2017 0831   ALKPHOS 71 07/19/2017 1030   ALKPHOS 65 05/11/2017 0831   AST 26 07/19/2017 1030   AST 26 05/11/2017 0831   ALT 19 07/19/2017 1030   ALT 19 05/11/2017 0831   BILITOT 1.0 07/19/2017 1030   BILITOT 0.75 05/11/2017 0831      Impression and Plan:  84 year old gentleman with:  1.  Stage 0 CLL after presenting with lymphocytosis: His laboratory data from today were reviewed and showed a white cell count of 29,000 which is close to his baseline.  The natural course of this disease was reviewed today with the patient as well as indication for treatment.  He remains asymptomatic from this disease and does not have any indication for treatment.  These were include painful adenopathy, constitutional symptoms or cytopenias.  I plan to observe him closely and institute treatment if he develops any of these complaints.  2.  Prostate cancer: He has biochemical relapse with a rise in his PSA.  He was treated with Lupron and his PSA is undetectable.  He continues to follow with Oscar Baker regarding this issue.  He will likely require additional therapy in the future  if he develops castration resistant disease and I will discuss these options with him at the time.  3. Follow-up: Will be in 6 months.   15  minutes was spent with the patient face-to-face today.  More than 50% of time was dedicated to patient counseling, education and coordination of his care.    Zola Button, MD 5/21/20198:43 AM

## 2017-11-10 ENCOUNTER — Ambulatory Visit: Payer: Medicare Other | Admitting: Physical Therapy

## 2017-11-10 ENCOUNTER — Ambulatory Visit (INDEPENDENT_AMBULATORY_CARE_PROVIDER_SITE_OTHER): Payer: Medicare Other | Admitting: *Deleted

## 2017-11-10 ENCOUNTER — Encounter: Payer: Self-pay | Admitting: Physical Therapy

## 2017-11-10 DIAGNOSIS — R2689 Other abnormalities of gait and mobility: Secondary | ICD-10-CM

## 2017-11-10 DIAGNOSIS — R296 Repeated falls: Secondary | ICD-10-CM

## 2017-11-10 DIAGNOSIS — M6281 Muscle weakness (generalized): Secondary | ICD-10-CM | POA: Diagnosis not present

## 2017-11-10 DIAGNOSIS — I4821 Permanent atrial fibrillation: Secondary | ICD-10-CM

## 2017-11-10 DIAGNOSIS — Z5181 Encounter for therapeutic drug level monitoring: Secondary | ICD-10-CM

## 2017-11-10 DIAGNOSIS — R2681 Unsteadiness on feet: Secondary | ICD-10-CM | POA: Diagnosis not present

## 2017-11-10 DIAGNOSIS — I482 Chronic atrial fibrillation: Secondary | ICD-10-CM | POA: Diagnosis not present

## 2017-11-10 LAB — POCT INR: INR: 1.2 — AB (ref 2.0–3.0)

## 2017-11-10 NOTE — Therapy (Signed)
[facility] [address] [place], [name], [address] Phone: [phone]   Fax:  [phone]  Physical Therapy Treatment  Patient Details  Name: Oscar Baker MRN: [id number] Date of Birth: 01-13-1933 Referring Provider: [name], MD   Encounter Date: 11/10/2017  PT End of Session - 11/10/17 2141    Visit Number  7    Number of Visits  25    Date for PT Re-Evaluation  01/18/18    Authorization Type  Medicare & BCBS supplement    Authorization Time Period  medicare guidelines with medical necessity; 100% coverage    PT Start Time  0800    PT Stop Time  0845    PT Time Calculation (min)  45 min    Equipment Utilized During Treatment  Gait belt    Activity Tolerance  Patient tolerated treatment well    Behavior During Therapy  [facility] for tasks assessed/performed       Past Medical History:  Diagnosis Date  . Asthma 1950's   history of  . Atrial fibrillation ([place])   . Bilateral carpal tunnel syndrome   . Bilateral lower extremity edema   . Bladder tumor   . Chronic systolic heart failure (HCC)    Echo 1/19: Mild LVH, EF 45-50, inf HK, MAC, severe LAE, severe RAE // Echo 7/15: Mild LVH, mod focal basal sept hypertrophy, EF 55-60, AV peak and mean 16/9, trivial MR, mod LAE, PASP 38  . CLL (chronic lymphocytic leukemia) [facility]) oncologist-  dr Ilene Qua--   dx [phone] ;  Lymphocytosis, CLL - per lov note 05-11-2017 currently under active survillance,  CT 04-17-2014 show very small lymphadenopathy, no indication for treatment  . Coronary artery disease    cardiologist-  dr Cathie Olden--  08-18-2017 Intermittant risk nuclear study w/ large area of inferior infartion with no evidence ishcemia   . 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 Diabetes mellitus type 2, noninsulin dependent ([place])   . Elevated PSA    since prostatectomy but now resolved  . Hematuria 04/2017  . History of ear infection    Right  . History of MI (myocardial infarction)     per myoview nuclear study 08-18-2017 , unknown when  . History of shingles 08/2017   L ear and scalp, possible  . Hyperlipidemia   . Hypertension   . Ischemic cardiomyopathy 09/01/2017   Presumed +CAD with Nuclear stress test 08/18/17 - Inferior scar, no ischemia, intermediate risk // med management unless +angina or worse dyspnea  . OA (osteoarthritis)   . Pacemaker 02/08/2014   followed by dr g. taylor--  single chamber Biotronik due to SSS  . Permanent atrial fibrillation ([place])   . Pneumonia 2019   Left lung  . Prostate cancer [facility]) urologist-  dr Diona Fanti   dx 2004--  Gleason 8, PSA 10.45--  11-28-2002  s/p  radical prostatectomy;  recurrent w/ increasing PSA, started ADT treatment  . RBBB (right bundle branch block)   . Sick sinus syndrome ([place])    a-Flutter with episodes of bradycardia; S/P Biotronik (serial number [id number]) 02-08-2014  . Urinary incontinence   . Wears hearing aid in right ear    receiver and transmitter    Past Surgical History:  Procedure Laterality Date  . APPENDECTOMY    . BACK SURGERY     disk  . CARDIAC CATHETERIZATION  09-03-1999  dr Cathie Olden   abnormal cardiolite study:  minor luminal irregularities but no critial coronary artery stenosis  .  CARDIOVASCULAR STRESS TEST  08-18-2017  dr Cathie Olden   Intermediate risk nuclear study w/ large area inferior infarction, no evidence of ishcemia (consistant w/ prior MI)/  study not gated due to frequent PVCs  . CARPAL TUNNEL RELEASE Right 2000  . CARPAL TUNNEL RELEASE Left 11/19/2009  . CATARACT EXTRACTION Right 07/2015  . CATARACT EXTRACTION Left 09/2015  . CYSTOSCOPY N/A 11/04/2017   Procedure: CYSTOSCOPY AND CAUTERIZATION OF BLADDER;  Surgeon: Franchot Gallo, MD;  Location: [facility];  Service: Urology;  Laterality: N/A;  . INSERTION PENILE PROSTHESIS  02-22-2004    dr Mattie Marlin  [facility]  . KNEE ARTHROSCOPY Left 07/2010  . PERMANENT PACEMAKER INSERTION N/A 02/08/2014   Procedure: PERMANENT  PACEMAKER INSERTION;  Surgeon: Evans Lance, MD;  Location: [facility] CATH LAB;  Service: Cardiovascular;  Laterality: N/A;  . RADICAL RETROPUBIC PROSTATECTOMY W/ BILATERAL PELVIC LYMPH NODE DISSECTION  11-28-2002   dr Mattie Marlin  [facility]  . TONSILLECTOMY    . TOTAL HIP ARTHROPLASTY Left 05/12/2016   Procedure: LEFT TOTAL HIP ARTHROPLASTY ANTERIOR APPROACH;  Surgeon: Paralee Cancel, MD;  Location: [facility];  Service: Orthopedics;  Laterality: Left;  . TOTAL HIP ARTHROPLASTY Right 07-15-2006   dr Alvan Dame  [facility]  . TRANSTHORACIC ECHOCARDIOGRAM  07/20/2017   mild LVH, ef 45-50%, hypokinesis of the basal-midinferior myocardium, due to AFib unable to evaluate diastolic function/  severe LAE and RAE/  trivial PR and TR    There were no vitals filed for this visit.  Subjective Assessment - 11/10/17 0800    Subjective  He has been doing the exercises.     Pertinent History  Left THA 05/12/16, right THA 2008, CHF, MI, DM2, HTN, OA, prostate CA, shingles, A-fib, hearing loss, knee sg,     Limitations  Lifting;Standing;House hold activities;Walking    Patient Stated Goals  to get off walker, be able to go out community    Currently in Pain?  No/denies                       [facility] Adult PT Treatment/Exercise - 11/10/17 0800      Transfers   Transfers  Sit to Stand;Stand to Sit    Sit to Stand  2: Max assist;5: Supervision;With upper extremity assist;With armrests;From chair/3-in-1 MaxA when he attempted to stand without support ant. to stab    Sit to Stand Details (indicate cue type and reason)  5 reps with RUE assist & 5 reps with LUE assist with RW close for stabilziation intermittent touch    Stand to Sit  5: Supervision;With upper extremity assist;With armrests;To chair/3-in-1    Number of Reps  10 reps      Ambulation/Gait   Ambulation/Gait  Yes    Ambulation/Gait Assistance  4: Min assist    Ambulation/Gait Assistance Details  tactile & verbal cues on upright posture, step width & wt shift.      Ambulation Distance (Feet)  75 Feet 75' X 2    Assistive device  Straight cane    Gait Pattern  Step-through pattern;Decreased arm swing - left;Decreased step length - left;Decreased stance time - right;Decreased stride length;Lateral hip instability;Trunk flexed;Wide base of support    Ambulation Surface  Level;Indoor      Knee/Hip Exercises: Stretches   Passive Hamstring Stretch  Right;Left;2 reps;30 seconds    Passive Hamstring Stretch Limitations  seated knee extension with trunk flexion    Gastroc Stretch  Right;Left;2 reps;30 seconds    Gastroc  Stretch Limitations  seated knee extended with belt pull    Other Knee/Hip Stretches  combination of belt gastroc & hamstring trunk flexion stretch  2 reps per LE 30 sec hold      Knee/Hip Exercises: Machines for Strengthening   Cybex Leg Press  75# 10 reps 2 sets;  50# 3 reps for pt to sense too low weight & 100# 1 rep to sense too much               PT Education - 11/10/17 0845    Education provided  Yes    Education Details  added hamstring & gactroc stretches to HEP, knee extension machine (used internet to describe how to properly use machine), sit to stand with single UE (RW close with goal to not touch to staibilize)     Person(s) Educated  Patient    Methods  Explanation;Verbal cues;Handout;Demonstration;Tactile cues       PT Short Term Goals - 11/10/17 2142      PT SHORT TERM GOAL #1   Title  Patient demonstrates understanding of initial HEP. (All STGs Target Date: 11/19/2017)    Time  1    Period  Months    Status  On-going    Target Date  11/19/17      PT SHORT TERM GOAL #2   Title  Patient reaches 5" anteriorly and looks over shoulders with trunk rotation without UE support with supervision.     Time  1    Period  Months    Status  On-going    Target Date  11/19/17      PT SHORT TERM GOAL #3   Title  Patient ambulates 300' with cane with minA scanning environment & negotiating around obstacles.     Time  1     Period  Months    Status  On-going    Target Date  11/19/17      PT SHORT TERM GOAL #4   Title  Patient negotiates ramps, curbs and stairs with 1 rail with cane with minA.    Time  1    Period  Months    Status  On-going    Target Date  11/19/17        PT Long Term Goals - 11/10/17 2143      PT LONG TERM GOAL #1   Title  Patient verbalizes & demonstrates ongoing HEP / fitness plan. (All LTGs Target Date: 01/14/2018)    Time  12    Period  Weeks    Status  On-going    Target Date  01/14/18      PT LONG TERM GOAL #2   Title  Berg Balance >45/56 to indicate lower fall risk.     Time  12    Period  Weeks    Status  On-going    Target Date  01/14/18      PT LONG TERM GOAL #3   Title  Timed Up-Go with cane or less <13.5sec independently.     Time  12    Period  Weeks    Status  On-going    Target Date  01/14/18      PT LONG TERM GOAL #4   Title  Patient ambulates 500' outdoors including grass with cane or less modified independent.     Time  12    Period  Weeks    Status  On-going    Target Date  01/14/18  PT LONG TERM GOAL #5   Title  Patient negotiates ramps, curbs with cane or less and stairs with single rail modified independent.     Time  12    Period  Weeks    Status  On-going    Target Date  01/14/18            Plan - 11/10/17 2210    Clinical Impression Statement  Patient stood up without support object to stabilize & required maxA to prevent fall. He seems to understand safety of having something (RW, table, counter) for stabilization. He reports improved understanding how to use knee extension machine at his fitness room including weight determination.      Rehab Potential  Good    PT Frequency  2x / week    PT Duration  12 weeks    PT Treatment/Interventions  ADLs/Self Care Home Management;DME Instruction;Gait training;Stair training;Functional mobility training;Therapeutic exercise;Therapeutic activities;Balance training;Neuromuscular  re-education;Patient/family education;Vestibular;Canalith Repostioning    PT Next Visit Plan  assess STGs, continue to address balance deficites, continue to gait train with cane    Consulted and Agree with Plan of Care  Patient       Patient will benefit from skilled therapeutic intervention in order to improve the following deficits and impairments:  Abnormal gait, Decreased activity tolerance, Decreased balance, Decreased endurance, Decreased coordination, Decreased knowledge of use of DME, Decreased mobility, Decreased strength, Postural dysfunction  Visit Diagnosis: Muscle weakness (generalized)  Unsteadiness on feet  Other abnormalities of gait and mobility  Repeated falls     Problem List Patient Active Problem List   Diagnosis Date Noted  . Ischemic cardiomyopathy 09/01/2017  . Right hip pain   . Acute otitis media   . Chronic systolic CHF (congestive heart failure) ([place])   . Pressure injury of skin 07/20/2017  . Malignant otitis externa 07/19/2017  . Fall at home, initial encounter 07/19/2017  . S/P left THA, AA 05/12/2016  . LPRD (laryngopharyngeal reflux disease) 05/21/2015  . Other allergic rhinitis 05/21/2015  . Obesity 12/12/2014  . Upper airway cough syndrome 12/11/2014  . Cough variant asthma 07/27/2014  . Wheezing 06/20/2014  . Symptomatic bradycardia - s/p Biotronik (serial number [id number]) 02/08/2014  . Diabetes mellitus type 2, noninsulin dependent ([place])   . Edema of foot 03/06/2013  . Traumatic ecchymosis of right foot 03/06/2013  . Bone spur 12/30/2012  . Pain of toe of left foot 12/30/2012  . Hyperlipidemia 07/13/2012  . HTN (hypertension) 04/12/2012  . Sick sinus syndrome ([place]) 08/17/2011  . Permanent atrial fibrillation ([place]) 09/16/2010    Ames Hoban PT, DPT 11/10/2017, 10:18 PM  [facility] [address] [place], [name], [address] Phone: [phone]   Fax:   [phone]  Name: Oscar Baker MRN: [id number] Date of Birth: July 11, 1932

## 2017-11-10 NOTE — Telephone Encounter (Signed)
Seems patient has procedure done on 5/16. Please contact requesting provider and ask if still needs clearance or not.

## 2017-11-10 NOTE — Patient Instructions (Signed)
Access Code: [email or web address]  URL: [email or web address]  Date: 11/10/2017  Prepared by: Jamey Reas   Exercises  Seated Hamstring Stretch - 2 reps - 1 sets - 30 seconds hold - 2x daily - 7x weekly  Seated Gastroc Stretch with Strap - 2 reps - 1 sets - 30 seconds hold - 2x daily - 7x weekly  Seated Hamstring Stretch with Strap - 2 reps - 1 sets - 30 seconds hold - 2x daily - 7x weekly  Knee Extension with Weight Machine - 10 reps - 2 sets - 2 hold - 1x daily - 4x weekly

## 2017-11-10 NOTE — Telephone Encounter (Signed)
Called Oscar Baker Dr. Dahlstedt's nurse at [facility] to see if patient still needed surgical clearance. She stated that patient does not. Informed Oscar Baker that I would make note of this in patient's chart.

## 2017-11-10 NOTE — Patient Instructions (Signed)
Description   Today May 22nd take 1 and 1/2 tablet then tomorrow May 23rd take 1 and 1/2 tablet then continue  taking 1 tablet every day except 1 and 1/2  tablets on Saturdays. Recheck INR in 1 week .  Call [facility] [phone] with any concerns

## 2017-11-17 ENCOUNTER — Ambulatory Visit: Payer: Medicare Other | Admitting: Physical Therapy

## 2017-11-17 ENCOUNTER — Encounter: Payer: Self-pay | Admitting: Physical Therapy

## 2017-11-17 DIAGNOSIS — R2689 Other abnormalities of gait and mobility: Secondary | ICD-10-CM

## 2017-11-17 DIAGNOSIS — R296 Repeated falls: Secondary | ICD-10-CM

## 2017-11-17 DIAGNOSIS — M6281 Muscle weakness (generalized): Secondary | ICD-10-CM

## 2017-11-17 DIAGNOSIS — R2681 Unsteadiness on feet: Secondary | ICD-10-CM

## 2017-11-17 NOTE — Therapy (Signed)
[name], [name], [address] Phone: [phone]   Fax:  [phone]  Physical Therapy Treatment  Patient Details  Name: Oscar Baker MRN: [id number] Date of Birth: Feb 11, 1933 Referring Provider: [name], MD   Encounter Date: 11/17/2017  PT End of Session - 11/17/17 0853    Visit Number  8    Number of Visits  25    Date for PT Re-Evaluation  01/18/18    Authorization Type  Medicare & BCBS supplement    Authorization Time Period  medicare guidelines with medical necessity; 100% coverage    PT Start Time  0848    PT Stop Time  0930    PT Time Calculation (min)  42 min    Equipment Utilized During Treatment  Gait belt    Activity Tolerance  Patient tolerated treatment well    Behavior During Therapy  [facility] for tasks assessed/performed       Past Medical History:  Diagnosis Date  . Asthma 1950's   history of  . Atrial fibrillation ([place])   . Bilateral carpal tunnel syndrome   . Bilateral lower extremity edema   . Bladder tumor   . Chronic systolic heart failure (HCC)    Echo 1/19: Mild LVH, EF 45-50, inf HK, MAC, severe LAE, severe RAE // Echo 7/15: Mild LVH, mod focal basal sept hypertrophy, EF 55-60, AV peak and mean 16/9, trivial MR, mod LAE, PASP 38  . CLL (chronic lymphocytic leukemia) [facility]) oncologist-  dr Ilene Qua--   dx [phone] ;  Lymphocytosis, CLL - per lov note 05-11-2017 currently under active survillance,  CT 04-17-2014 show very small lymphadenopathy, no indication for treatment  . Coronary artery disease    cardiologist-  dr Cathie Olden--  08-18-2017 Intermittant risk nuclear study w/ large area of inferior infartion with no evidence ishcemia   . 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 Diabetes mellitus type 2, noninsulin dependent ([place])   . Elevated PSA    since prostatectomy but now resolved  . Hematuria 04/2017  . History of ear infection    Right  . History of MI (myocardial infarction)     per myoview nuclear study 08-18-2017 , unknown when  . History of shingles 08/2017   L ear and scalp, possible  . Hyperlipidemia   . Hypertension   . Ischemic cardiomyopathy 09/01/2017   Presumed +CAD with Nuclear stress test 08/18/17 - Inferior scar, no ischemia, intermediate risk // med management unless +angina or worse dyspnea  . OA (osteoarthritis)   . Pacemaker 02/08/2014   followed by dr g. taylor--  single chamber Biotronik due to SSS  . Permanent atrial fibrillation ([place])   . Pneumonia 2019   Left lung  . Prostate cancer [facility]) urologist-  dr Diona Fanti   dx 2004--  Gleason 8, PSA 10.45--  11-28-2002  s/p  radical prostatectomy;  recurrent w/ increasing PSA, started ADT treatment  . RBBB (right bundle branch block)   . Sick sinus syndrome ([place])    a-Flutter with episodes of bradycardia; S/P Biotronik (serial number [id number]) 02-08-2014  . Urinary incontinence   . Wears hearing aid in right ear    receiver and transmitter    Past Surgical History:  Procedure Laterality Date  . APPENDECTOMY    . BACK SURGERY     disk  . CARDIAC CATHETERIZATION  09-03-1999  dr Cathie Olden   abnormal cardiolite study:  minor luminal irregularities but no critial coronary artery stenosis  .  CARDIOVASCULAR STRESS TEST  08-18-2017  dr Cathie Olden   Intermediate risk nuclear study w/ large area inferior infarction, no evidence of ishcemia (consistant w/ prior MI)/  study not gated due to frequent PVCs  . CARPAL TUNNEL RELEASE Right 2000  . CARPAL TUNNEL RELEASE Left 11/19/2009  . CATARACT EXTRACTION Right 07/2015  . CATARACT EXTRACTION Left 09/2015  . CYSTOSCOPY N/A 11/04/2017   Procedure: CYSTOSCOPY AND CAUTERIZATION OF BLADDER;  Surgeon: Franchot Gallo, MD;  Location: [facility];  Service: Urology;  Laterality: N/A;  . INSERTION PENILE PROSTHESIS  02-22-2004    dr Mattie Marlin  [facility]  . KNEE ARTHROSCOPY Left 07/2010  . PERMANENT PACEMAKER INSERTION N/A 02/08/2014   Procedure: PERMANENT  PACEMAKER INSERTION;  Surgeon: Evans Lance, MD;  Location: [facility] CATH LAB;  Service: Cardiovascular;  Laterality: N/A;  . RADICAL RETROPUBIC PROSTATECTOMY W/ BILATERAL PELVIC LYMPH NODE DISSECTION  11-28-2002   dr Mattie Marlin  [facility]  . TONSILLECTOMY    . TOTAL HIP ARTHROPLASTY Left 05/12/2016   Procedure: LEFT TOTAL HIP ARTHROPLASTY ANTERIOR APPROACH;  Surgeon: Paralee Cancel, MD;  Location: [facility];  Service: Orthopedics;  Laterality: Left;  . TOTAL HIP ARTHROPLASTY Right 07-15-2006   dr Alvan Dame  [facility]  . TRANSTHORACIC ECHOCARDIOGRAM  07/20/2017   mild LVH, ef 45-50%, hypokinesis of the basal-midinferior myocardium, due to AFib unable to evaluate diastolic function/  severe LAE and RAE/  trivial PR and TR    There were no vitals filed for this visit.  Subjective Assessment - 11/17/17 0852    Subjective  No new complaints. No falls or pain to report. Has not used the gym as yet since they went to the [place].     Patient is accompained by:  Family member    Pertinent History  Left THA 05/12/16, right THA 2008, CHF, MI, DM2, HTN, OA, prostate CA, shingles, A-fib, hearing loss, knee sg,     Limitations  Lifting;Standing;House hold activities;Walking    Patient Stated Goals  to get off walker, be able to go out community    Currently in Pain?  No/denies    Pain Score  0-No pain          [facility] Adult PT Treatment/Exercise - 11/17/17 0907      Transfers   Transfers  Sit to Stand;Stand to Sit    Sit to Stand  5: Supervision;With upper extremity assist;From chair/3-in-1;With armrests    Stand to Sit  5: Supervision;With upper extremity assist;To chair/3-in-1      Ambulation/Gait   Ambulation/Gait  Yes    Ambulation/Gait Assistance  4: Min assist;3: Mod assist    Ambulation/Gait Assistance Details  cues needed for posture, cane placement and to ensure left foot clearance with swing phase.     Ambulation Distance (Feet)  215 Feet x1    Assistive device  Straight cane    Gait Pattern  Step-through  pattern;Decreased arm swing - left;Decreased step length - left;Decreased stance time - right;Decreased stride length;Lateral hip instability;Trunk flexed;Wide base of support    Ambulation Surface  Level;Indoor    Stairs  Yes    Stairs Assistance  4: Min guard    Stair Management Technique  One rail Right;One rail Left;Alternating pattern;Step to pattern;Forwards;With cane    Number of Stairs  4 x2 reps    Height of Stairs  6    Ramp  4: Min assist    Ramp Details (indicate cue type and reason)  x1 with cane. cues on posture,  step length and sequencing    Curb  3: Mod assist    Curb Details (indicate cue type and reason)  min assist to descend with cane, mod assist with touch to window seal needed with ascending      Neuro Re-ed    Neuro Re-ed Details   for balance/mm re-education: pt able to reach ~4-5 inches forward/and back with no balance issues; pt able to look over bil shoulders with good upper trunk rotation with no issues/balance loss;          PT Education - 11/17/17 0925    Education provided  Yes    Education Details  consolidated HEP with it broken down into specific days for each so not to be overwhelming; use of cane in kitchen only at this time    Person(s) Educated  Patient    Methods  Explanation;Demonstration;Verbal cues;Handout    Comprehension  Verbalized understanding;Returned demonstration;Verbal cues required       PT Short Term Goals - 11/17/17 0859      PT SHORT TERM GOAL #1   Title  Patient demonstrates understanding of initial HEP. (All STGs Target Date: 11/19/2017)    Baseline  11/17/17: HEP consolidated today, needs to be reviewed at next session    Time  --    Period  --    Status  On-going      PT SHORT TERM GOAL #2   Title  Patient reaches 5" anteriorly and looks over shoulders with trunk rotation without UE support with supervision.     Baseline  11/17/17: met today    Time  --    Period  --    Status  Achieved      PT SHORT TERM GOAL #3    Title  Patient ambulates 300' with cane with minA scanning environment & negotiating around obstacles.     Baseline  11/17/17: not met today    Time  --    Period  --    Status  Not Met      PT SHORT TERM GOAL #4   Title  Patient negotiates ramps, curbs and stairs with 1 rail with cane with minA.    Baseline  11/17/17: met today on stairs with 1 rail/cane, min assist on ramp and mod assist with curb negotiation    Time  --    Period  --    Status  Partially Met        PT Long Term Goals - 11/10/17 2143      PT LONG TERM GOAL #1   Title  Patient verbalizes & demonstrates ongoing HEP / fitness plan. (All LTGs Target Date: 01/14/2018)    Time  12    Period  Weeks    Status  On-going    Target Date  01/14/18      PT LONG TERM GOAL #2   Title  Berg Balance >45/56 to indicate lower fall risk.     Time  12    Period  Weeks    Status  On-going    Target Date  01/14/18      PT LONG TERM GOAL #3   Title  Timed Up-Go with cane or less <13.5sec independently.     Time  12    Period  Weeks    Status  On-going    Target Date  01/14/18      PT LONG TERM GOAL #4   Title  Patient ambulates 500' outdoors including grass with  cane or less modified independent.     Time  12    Period  Weeks    Status  On-going    Target Date  01/14/18      PT LONG TERM GOAL #5   Title  Patient negotiates ramps, curbs with cane or less and stairs with single rail modified independent.     Time  12    Period  Weeks    Status  On-going    Target Date  01/14/18            Plan - 11/17/17 0853    Clinical Impression Statement  Today's skilled session focused on progress toward STGs. Pt met the goal for unsupported standing, partially met the barriers goal and did not meet the gait with cane goal. Pt's HEP has been consolidated today, will need to be reviewed at next session.                                             Rehab Potential  Good    PT Frequency  2x / week    PT Duration  12 weeks    PT  Treatment/Interventions  ADLs/Self Care Home Management;DME Instruction;Gait training;Stair training;Functional mobility training;Therapeutic exercise;Therapeutic activities;Balance training;Neuromuscular re-education;Patient/family education;Vestibular;Canalith Repostioning    PT Next Visit Plan  review consolidated HEP, make any needed corrections and print out at next session; continue with dynamic balance. strengthening and gait with cane.     Consulted and Agree with Plan of Care  Patient       Patient will benefit from skilled therapeutic intervention in order to improve the following deficits and impairments:  Abnormal gait, Decreased activity tolerance, Decreased balance, Decreased endurance, Decreased coordination, Decreased knowledge of use of DME, Decreased mobility, Decreased strength, Postural dysfunction  Visit Diagnosis: Muscle weakness (generalized)  Unsteadiness on feet  Other abnormalities of gait and mobility  Repeated falls     Problem List Patient Active Problem List   Diagnosis Date Noted  . Ischemic cardiomyopathy 09/01/2017  . Right hip pain   . Acute otitis media   . Chronic systolic CHF (congestive heart failure) ([place])   . Pressure injury of skin 07/20/2017  . Malignant otitis externa 07/19/2017  . Fall at home, initial encounter 07/19/2017  . S/P left THA, AA 05/12/2016  . LPRD (laryngopharyngeal reflux disease) 05/21/2015  . Other allergic rhinitis 05/21/2015  . Obesity 12/12/2014  . Upper airway cough syndrome 12/11/2014  . Cough variant asthma 07/27/2014  . Wheezing 06/20/2014  . Symptomatic bradycardia - s/p Biotronik (serial number [id number]) 02/08/2014  . Diabetes mellitus type 2, noninsulin dependent ([place])   . Edema of foot 03/06/2013  . Traumatic ecchymosis of right foot 03/06/2013  . Bone spur 12/30/2012  . Pain of toe of left foot 12/30/2012  . Hyperlipidemia 07/13/2012  . HTN (hypertension) 04/12/2012  . Sick sinus syndrome ([place])  08/17/2011  . Permanent atrial fibrillation ([place]) 09/16/2010    Willow Ora, PTA, [place] [address], [place], [place] [address] [phone] 11/17/17, 7:59 PM   Name: Oscar Baker MRN: [id number] Date of Birth: 1932/11/20

## 2017-11-17 NOTE — Patient Instructions (Addendum)
Perform these every day:      Perform these on Monday/Friday's for strengthening:  Sit to Stand / Stand to Sit / Transfers    Sit on edge of a solid chair with arms, feet flat on floor. Lean forward over feet and stand up with hands on chair arms. Sit down slowly with hands on chair arms. Repeat _10_ times per session. Do _1_ sessions per day.  Copyright  VHI. All rights reserved.   Perform these at a counter for balance assistance as needed:  Toe / Heel Raise (Standing)    Standing with support, raise heels up and hold for 5 seconds, then raise toes up for 5 seconds. Repeat _10_ times. 1 time a day  Copyright  VHI. All rights reserved.     Standing Hip Abduction  While standing upright, place band around both ankles and kick leg out to the side while keeping toes straight on both feet. Avoid leaning to the side or hiking hip up to gain more motion. Repeat with other leg, alternating legs.   10 reps each leg. 1 time a day        LOOPED ELASTIC BAND HIP EXTENSION  While standing with an elastic band looped around your ankles, move the target leg back as shown. Repeat with the other leg, alternating legs.    Keep your knees straight the entire time.   Perform these for balance on Tuesday/Thursday's:  Perform these in a corner with a chair in front of you for safety:  Feet Apart (Compliant Surface) Varied Arm Positions - Eyes Closed    Stand on compliant surface: _pillow/s_ with feet shoulder width apart and arms at sides. Close eyes and visualize upright position. Hold_30_ seconds. Repeat _3_ times per session. Do _1-2_ sessions per day.  Copyright  VHI. All rights reserved.   Feet Apart (Compliant Surface) Head Motion - Eyes Closed    Stand on compliant surface: _pillow/s_ with feet shoulder width apart. Close eyes and move head slowly: 1. Up and down x 10 reps 2. Left and right x 10 reps 3. Up at ceiling toward right/down at floor  toward left x 10 reps (diagonal movement) 4. Up at ceiling toward left/down at floor toward right x 10 reps (diagonal movement)  Do _1-2_ sessions per day.  Copyright  VHI. All rights reserved.    Single Leg - Eyes Open    Holding support, lift right leg while maintaining balance over other leg. Progress to removing hands from support surface for longer periods of time. Hold_30_ seconds. Repeat _3_ times each leg per session. Do _1-2_ sessions per day.  Copyright  VHI. All rights reserved.  10 reps each leg. 1 time a day.   Perform these on Wednesday/Saturday's at the gym:  1. Ride the bike. 2. Use leg press, knee extension machine 3. Use UE weight machines you have been shown

## 2017-11-18 ENCOUNTER — Encounter: Payer: Self-pay | Admitting: Physical Therapy

## 2017-11-18 ENCOUNTER — Ambulatory Visit: Payer: Medicare Other | Admitting: Physical Therapy

## 2017-11-18 DIAGNOSIS — R2689 Other abnormalities of gait and mobility: Secondary | ICD-10-CM

## 2017-11-18 DIAGNOSIS — M6281 Muscle weakness (generalized): Secondary | ICD-10-CM

## 2017-11-18 DIAGNOSIS — R2681 Unsteadiness on feet: Secondary | ICD-10-CM

## 2017-11-18 DIAGNOSIS — R296 Repeated falls: Secondary | ICD-10-CM

## 2017-11-18 NOTE — Therapy (Signed)
[facility] [address] [place], [name], [address] Phone: [phone]   Fax:  [phone]  Physical Therapy Treatment  Patient Details  Name: Oscar Baker MRN: [id number] Date of Birth: 06/14/1933 Referring Provider: [name], MD   Encounter Date: 11/18/2017  PT End of Session - 11/18/17 1246    Visit Number  9    Number of Visits  25    Date for PT Re-Evaluation  01/18/18    Authorization Type  Medicare & BCBS supplement    Authorization Time Period  medicare guidelines with medical necessity; 100% coverage    PT Start Time  0800    PT Stop Time  0844    PT Time Calculation (min)  44 min    Equipment Utilized During Treatment  Gait belt    Activity Tolerance  Patient tolerated treatment well    Behavior During Therapy  [facility] for tasks assessed/performed       Past Medical History:  Diagnosis Date  . Asthma 1950's   history of  . Atrial fibrillation ([place])   . Bilateral carpal tunnel syndrome   . Bilateral lower extremity edema   . Bladder tumor   . Chronic systolic heart failure (HCC)    Echo 1/19: Mild LVH, EF 45-50, inf HK, MAC, severe LAE, severe RAE // Echo 7/15: Mild LVH, mod focal basal sept hypertrophy, EF 55-60, AV peak and mean 16/9, trivial MR, mod LAE, PASP 38  . CLL (chronic lymphocytic leukemia) [facility]) oncologist-  dr Ilene Qua--   dx [phone] ;  Lymphocytosis, CLL - per lov note 05-11-2017 currently under active survillance,  CT 04-17-2014 show very small lymphadenopathy, no indication for treatment  . Coronary artery disease    cardiologist-  dr Cathie Olden--  08-18-2017 Intermittant risk nuclear study w/ large area of inferior infartion with no evidence ishcemia   . 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 Diabetes mellitus type 2, noninsulin dependent ([place])   . Elevated PSA    since prostatectomy but now resolved  . Hematuria 04/2017  . History of ear infection    Right  . History of MI (myocardial infarction)     per myoview nuclear study 08-18-2017 , unknown when  . History of shingles 08/2017   L ear and scalp, possible  . Hyperlipidemia   . Hypertension   . Ischemic cardiomyopathy 09/01/2017   Presumed +CAD with Nuclear stress test 08/18/17 - Inferior scar, no ischemia, intermediate risk // med management unless +angina or worse dyspnea  . OA (osteoarthritis)   . Pacemaker 02/08/2014   followed by dr g. taylor--  single chamber Biotronik due to SSS  . Permanent atrial fibrillation ([place])   . Pneumonia 2019   Left lung  . Prostate cancer [facility]) urologist-  dr Diona Fanti   dx 2004--  Gleason 8, PSA 10.45--  11-28-2002  s/p  radical prostatectomy;  recurrent w/ increasing PSA, started ADT treatment  . RBBB (right bundle branch block)   . Sick sinus syndrome ([place])    a-Flutter with episodes of bradycardia; S/P Biotronik (serial number [id number]) 02-08-2014  . Urinary incontinence   . Wears hearing aid in right ear    receiver and transmitter    Past Surgical History:  Procedure Laterality Date  . APPENDECTOMY    . BACK SURGERY     disk  . CARDIAC CATHETERIZATION  09-03-1999  dr Cathie Olden   abnormal cardiolite study:  minor luminal irregularities but no critial coronary artery stenosis  .  CARDIOVASCULAR STRESS TEST  08-18-2017  dr Cathie Olden   Intermediate risk nuclear study w/ large area inferior infarction, no evidence of ishcemia (consistant w/ prior MI)/  study not gated due to frequent PVCs  . CARPAL TUNNEL RELEASE Right 2000  . CARPAL TUNNEL RELEASE Left 11/19/2009  . CATARACT EXTRACTION Right 07/2015  . CATARACT EXTRACTION Left 09/2015  . CYSTOSCOPY N/A 11/04/2017   Procedure: CYSTOSCOPY AND CAUTERIZATION OF BLADDER;  Surgeon: Franchot Gallo, MD;  Location: [facility];  Service: Urology;  Laterality: N/A;  . INSERTION PENILE PROSTHESIS  02-22-2004    dr Mattie Marlin  [facility]  . KNEE ARTHROSCOPY Left 07/2010  . PERMANENT PACEMAKER INSERTION N/A 02/08/2014   Procedure: PERMANENT  PACEMAKER INSERTION;  Surgeon: Evans Lance, MD;  Location: [facility] CATH LAB;  Service: Cardiovascular;  Laterality: N/A;  . RADICAL RETROPUBIC PROSTATECTOMY W/ BILATERAL PELVIC LYMPH NODE DISSECTION  11-28-2002   dr Mattie Marlin  [facility]  . TONSILLECTOMY    . TOTAL HIP ARTHROPLASTY Left 05/12/2016   Procedure: LEFT TOTAL HIP ARTHROPLASTY ANTERIOR APPROACH;  Surgeon: Paralee Cancel, MD;  Location: [facility];  Service: Orthopedics;  Laterality: Left;  . TOTAL HIP ARTHROPLASTY Right 07-15-2006   dr Alvan Dame  [facility]  . TRANSTHORACIC ECHOCARDIOGRAM  07/20/2017   mild LVH, ef 45-50%, hypokinesis of the basal-midinferior myocardium, due to AFib unable to evaluate diastolic function/  severe LAE and RAE/  trivial PR and TR    There were no vitals filed for this visit.  Subjective Assessment - 11/18/17 0800    Subjective  He is tired a lot especially in mornings.     Patient is accompained by:  Family member    Pertinent History  Left THA 05/12/16, right THA 2008, CHF, MI, DM2, HTN, OA, prostate CA, shingles, A-fib, hearing loss, knee sg,     Limitations  Lifting;Standing;House hold activities;Walking    Patient Stated Goals  to get off walker, be able to go out community    Currently in Pain?  Yes    Pain Score  4     Pain Location  Leg    Pain Orientation  Right;Left;Posterior    Pain Descriptors / Indicators  Aching    Pain Onset  1 to 4 weeks ago    Pain Frequency  Intermittent    Aggravating Factors   walking    Pain Relieving Factors  sitting                       [facility] Adult PT Treatment/Exercise - 11/18/17 0800      Transfers   Transfers  Sit to Stand;Stand to Sit    Sit to Stand  5: Supervision;With upper extremity assist;From chair/3-in-1;With armrests chairs with & without armrests    Sit to Stand Details (indicate cue type and reason)  demo, verbal & tactile cues on stabilzation with forward weight shift followed by upright posture with upper extension over feet.     Stand to Sit   5: Supervision;With upper extremity assist;To chair/3-in-1;With armrests chairs with & without armrests    Stand to Sit Details  demo, verbal & tactile cues on maintaining weight over feet while "folding" to lower hands first.       Ambulation/Gait   Ambulation/Gait  Yes    Ambulation/Gait Assistance  4: Min assist    Ambulation/Gait Assistance Details  assessed gait with straight cane with quad tip & with std tip: Patient had more fluency with less balance  losses with quad tip.  Pt able to scan environment & negotiated around obstacles with minA & tactile cues.     Ambulation Distance (Feet)  300 Feet 100'. 300', 100' X 2    Assistive device  Straight cane quad tip    Gait Pattern  Step-through pattern;Decreased arm swing - left;Decreased step length - left;Decreased stance time - right;Decreased stride length;Lateral hip instability;Trunk flexed;Wide base of support    Ambulation Surface  Indoor;Level    Stairs  --    Stairs Assistance  --    Stair Management Technique  --    Number of Stairs  --    Height of Stairs  --    Ramp  --    Curb  --      Neuro Re-ed    Neuro Re-ed Details   --             PT Education - 11/18/17 0840    Education provided  Yes    Education Details  PT recommendation for quad tip for cane along with differences noted by PT    Person(s) Educated  Patient    Methods  Explanation;Verbal cues    Comprehension  Verbalized understanding       PT Short Term Goals - 11/18/17 1247      PT SHORT TERM GOAL #1   Title  Patient demonstrates understanding of initial HEP. (All STGs Target Date: 11/19/2017)    Baseline  11/18/2017  MET Pt verbalized better understanding since consolidated at last PT appt    Status  Achieved      PT SHORT TERM GOAL #2   Title  Patient reaches 5" anteriorly and looks over shoulders with trunk rotation without UE support with supervision.     Baseline  11/17/17: met today    Status  Achieved      PT SHORT TERM GOAL #3    Title  Patient ambulates 300' with cane with minA scanning environment & negotiating around obstacles.     Baseline  MET 11/18/2017    Status  Achieved      PT SHORT TERM GOAL #4   Title  Patient negotiates ramps, curbs and stairs with 1 rail with cane with minA.    Baseline  11/17/17: met today on stairs with 1 rail/cane, min assist on ramp and mod assist with curb negotiation    Status  Partially Met        PT Long Term Goals - 11/10/17 2143      PT LONG TERM GOAL #1   Title  Patient verbalizes & demonstrates ongoing HEP / fitness plan. (All LTGs Target Date: 01/14/2018)    Time  12    Period  Weeks    Status  On-going    Target Date  01/14/18      PT LONG TERM GOAL #2   Title  Berg Balance >45/56 to indicate lower fall risk.     Time  12    Period  Weeks    Status  On-going    Target Date  01/14/18      PT LONG TERM GOAL #3   Title  Timed Up-Go with cane or less <13.5sec independently.     Time  12    Period  Weeks    Status  On-going    Target Date  01/14/18      PT LONG TERM GOAL #4   Title  Patient ambulates 500' outdoors including grass with cane or  less modified independent.     Time  12    Period  Weeks    Status  On-going    Target Date  01/14/18      PT LONG TERM GOAL #5   Title  Patient negotiates ramps, curbs with cane or less and stairs with single rail modified independent.     Time  12    Period  Weeks    Status  On-going    Target Date  01/14/18            Plan - 11/18/17 1249    Clinical Impression Statement  Patient met or partially met all STGs set for 30 days.  He appears more stable with quad tip on straight cane than regular small circular tip.  Patient improved sit to from stand including chairs without armrests with cues on technique to improve stabilization.     Rehab Potential  Good    PT Frequency  2x / week    PT Duration  12 weeks    PT Treatment/Interventions  ADLs/Self Care Home Management;DME Instruction;Gait training;Stair  training;Functional mobility training;Therapeutic exercise;Therapeutic activities;Balance training;Neuromuscular re-education;Patient/family education;Vestibular;Canalith Repostioning    PT Next Visit Plan  review consolidated HEP, make any needed corrections and print out at next session; continue with dynamic balance. strengthening and gait with cane with quad tip     Consulted and Agree with Plan of Care  Patient       Patient will benefit from skilled therapeutic intervention in order to improve the following deficits and impairments:  Abnormal gait, Decreased activity tolerance, Decreased balance, Decreased endurance, Decreased coordination, Decreased knowledge of use of DME, Decreased mobility, Decreased strength, Postural dysfunction  Visit Diagnosis: Muscle weakness (generalized)  Unsteadiness on feet  Other abnormalities of gait and mobility  Repeated falls     Problem List Patient Active Problem List   Diagnosis Date Noted  . Ischemic cardiomyopathy 09/01/2017  . Right hip pain   . Acute otitis media   . Chronic systolic CHF (congestive heart failure) ([place])   . Pressure injury of skin 07/20/2017  . Malignant otitis externa 07/19/2017  . Fall at home, initial encounter 07/19/2017  . S/P left THA, AA 05/12/2016  . LPRD (laryngopharyngeal reflux disease) 05/21/2015  . Other allergic rhinitis 05/21/2015  . Obesity 12/12/2014  . Upper airway cough syndrome 12/11/2014  . Cough variant asthma 07/27/2014  . Wheezing 06/20/2014  . Symptomatic bradycardia - s/p Biotronik (serial number [id number]) 02/08/2014  . Diabetes mellitus type 2, noninsulin dependent ([place])   . Edema of foot 03/06/2013  . Traumatic ecchymosis of right foot 03/06/2013  . Bone spur 12/30/2012  . Pain of toe of left foot 12/30/2012  . Hyperlipidemia 07/13/2012  . HTN (hypertension) 04/12/2012  . Sick sinus syndrome ([place]) 08/17/2011  . Permanent atrial fibrillation ([place]) 09/16/2010    ,  PT, DPT 11/18/2017, 12:58 PM  [facility] [address] [place], [name], [address] Phone: [phone]   Fax:  [phone]  Name: Oscar Baker MRN: [id number] Date of Birth: 1932-08-08

## 2017-11-19 ENCOUNTER — Ambulatory Visit (INDEPENDENT_AMBULATORY_CARE_PROVIDER_SITE_OTHER): Payer: Medicare Other | Admitting: *Deleted

## 2017-11-19 DIAGNOSIS — I482 Chronic atrial fibrillation: Secondary | ICD-10-CM

## 2017-11-19 DIAGNOSIS — I4821 Permanent atrial fibrillation: Secondary | ICD-10-CM

## 2017-11-19 LAB — POCT INR: INR: 2 (ref 2.0–3.0)

## 2017-11-19 NOTE — Patient Instructions (Addendum)
Description   Continue  taking 1 tablet every day except 1 and 1/2  tablets on Saturdays. Recheck INR in 2 weeks with MD Appt.  Call [facility] [phone] with any concerns

## 2017-11-20 ENCOUNTER — Inpatient Hospital Stay (HOSPITAL_COMMUNITY)
Admission: EM | Admit: 2017-11-20 | Discharge: 2017-12-05 | DRG: 853 | Disposition: A | Discharge status: Home Health | Payer: Medicare Other | Attending: Cardiothoracic Surgery | Admitting: Cardiothoracic Surgery

## 2017-11-20 ENCOUNTER — Other Ambulatory Visit: Payer: Self-pay

## 2017-11-20 ENCOUNTER — Encounter (HOSPITAL_COMMUNITY): Payer: Self-pay | Admitting: Emergency Medicine

## 2017-11-20 ENCOUNTER — Emergency Department (HOSPITAL_COMMUNITY): Payer: Medicare Other

## 2017-11-20 DIAGNOSIS — E871 Hypo-osmolality and hyponatremia: Secondary | ICD-10-CM | POA: Diagnosis not present

## 2017-11-20 DIAGNOSIS — A413 Sepsis due to Hemophilus influenzae: Principal | ICD-10-CM | POA: Diagnosis present

## 2017-11-20 DIAGNOSIS — Z9079 Acquired absence of other genital organ(s): Secondary | ICD-10-CM

## 2017-11-20 DIAGNOSIS — Z87891 Personal history of nicotine dependence: Secondary | ICD-10-CM

## 2017-11-20 DIAGNOSIS — Z96643 Presence of artificial hip joint, bilateral: Secondary | ICD-10-CM | POA: Diagnosis present

## 2017-11-20 DIAGNOSIS — R0902 Hypoxemia: Secondary | ICD-10-CM | POA: Diagnosis not present

## 2017-11-20 DIAGNOSIS — S0003XA Contusion of scalp, initial encounter: Secondary | ICD-10-CM | POA: Diagnosis present

## 2017-11-20 DIAGNOSIS — I110 Hypertensive heart disease with heart failure: Secondary | ICD-10-CM | POA: Diagnosis present

## 2017-11-20 DIAGNOSIS — Z9842 Cataract extraction status, left eye: Secondary | ICD-10-CM

## 2017-11-20 DIAGNOSIS — J189 Pneumonia, unspecified organism: Secondary | ICD-10-CM | POA: Diagnosis not present

## 2017-11-20 DIAGNOSIS — I480 Paroxysmal atrial fibrillation: Secondary | ICD-10-CM | POA: Diagnosis present

## 2017-11-20 DIAGNOSIS — I5022 Chronic systolic (congestive) heart failure: Secondary | ICD-10-CM | POA: Diagnosis not present

## 2017-11-20 DIAGNOSIS — I10 Essential (primary) hypertension: Secondary | ICD-10-CM

## 2017-11-20 DIAGNOSIS — J95860 Postprocedural hematoma of a respiratory system organ or structure following a respiratory system procedure: Secondary | ICD-10-CM | POA: Diagnosis not present

## 2017-11-20 DIAGNOSIS — R091 Pleurisy: Secondary | ICD-10-CM | POA: Diagnosis not present

## 2017-11-20 DIAGNOSIS — J181 Lobar pneumonia, unspecified organism: Secondary | ICD-10-CM | POA: Diagnosis present

## 2017-11-20 DIAGNOSIS — J9811 Atelectasis: Secondary | ICD-10-CM | POA: Diagnosis not present

## 2017-11-20 DIAGNOSIS — D62 Acute posthemorrhagic anemia: Secondary | ICD-10-CM | POA: Diagnosis not present

## 2017-11-20 DIAGNOSIS — I482 Chronic atrial fibrillation: Secondary | ICD-10-CM | POA: Diagnosis present

## 2017-11-20 DIAGNOSIS — Z7901 Long term (current) use of anticoagulants: Secondary | ICD-10-CM

## 2017-11-20 DIAGNOSIS — J942 Hemothorax: Secondary | ICD-10-CM | POA: Diagnosis not present

## 2017-11-20 DIAGNOSIS — I495 Sick sinus syndrome: Secondary | ICD-10-CM | POA: Diagnosis present

## 2017-11-20 DIAGNOSIS — R1310 Dysphagia, unspecified: Secondary | ICD-10-CM | POA: Diagnosis present

## 2017-11-20 DIAGNOSIS — R053 Chronic cough: Secondary | ICD-10-CM

## 2017-11-20 DIAGNOSIS — Z8546 Personal history of malignant neoplasm of prostate: Secondary | ICD-10-CM | POA: Diagnosis not present

## 2017-11-20 DIAGNOSIS — Y838 Other surgical procedures as the cause of abnormal reaction of the patient, or of later complication, without mention of misadventure at the time of the procedure: Secondary | ICD-10-CM | POA: Diagnosis not present

## 2017-11-20 DIAGNOSIS — Z974 Presence of external hearing-aid: Secondary | ICD-10-CM

## 2017-11-20 DIAGNOSIS — I252 Old myocardial infarction: Secondary | ICD-10-CM | POA: Diagnosis not present

## 2017-11-20 DIAGNOSIS — I447 Left bundle-branch block, unspecified: Secondary | ICD-10-CM | POA: Diagnosis present

## 2017-11-20 DIAGNOSIS — J869 Pyothorax without fistula: Secondary | ICD-10-CM | POA: Diagnosis present

## 2017-11-20 DIAGNOSIS — A419 Sepsis, unspecified organism: Secondary | ICD-10-CM | POA: Diagnosis not present

## 2017-11-20 DIAGNOSIS — E1165 Type 2 diabetes mellitus with hyperglycemia: Secondary | ICD-10-CM | POA: Diagnosis present

## 2017-11-20 DIAGNOSIS — Z888 Allergy status to other drugs, medicaments and biological substances status: Secondary | ICD-10-CM

## 2017-11-20 DIAGNOSIS — Y95 Nosocomial condition: Secondary | ICD-10-CM | POA: Diagnosis present

## 2017-11-20 DIAGNOSIS — E119 Type 2 diabetes mellitus without complications: Secondary | ICD-10-CM

## 2017-11-20 DIAGNOSIS — R0602 Shortness of breath: Secondary | ICD-10-CM

## 2017-11-20 DIAGNOSIS — R590 Localized enlarged lymph nodes: Secondary | ICD-10-CM

## 2017-11-20 DIAGNOSIS — I509 Heart failure, unspecified: Secondary | ICD-10-CM | POA: Diagnosis not present

## 2017-11-20 DIAGNOSIS — J45909 Unspecified asthma, uncomplicated: Secondary | ICD-10-CM | POA: Diagnosis present

## 2017-11-20 DIAGNOSIS — Z4682 Encounter for fitting and adjustment of non-vascular catheter: Secondary | ICD-10-CM

## 2017-11-20 DIAGNOSIS — I255 Ischemic cardiomyopathy: Secondary | ICD-10-CM | POA: Diagnosis present

## 2017-11-20 DIAGNOSIS — I2510 Atherosclerotic heart disease of native coronary artery without angina pectoris: Secondary | ICD-10-CM | POA: Diagnosis present

## 2017-11-20 DIAGNOSIS — E785 Hyperlipidemia, unspecified: Secondary | ICD-10-CM

## 2017-11-20 DIAGNOSIS — Z8249 Family history of ischemic heart disease and other diseases of the circulatory system: Secondary | ICD-10-CM

## 2017-11-20 DIAGNOSIS — Z825 Family history of asthma and other chronic lower respiratory diseases: Secondary | ICD-10-CM

## 2017-11-20 DIAGNOSIS — Z950 Presence of cardiac pacemaker: Secondary | ICD-10-CM

## 2017-11-20 DIAGNOSIS — C9190 Lymphoid leukemia, unspecified not having achieved remission: Secondary | ICD-10-CM | POA: Diagnosis not present

## 2017-11-20 DIAGNOSIS — Z9689 Presence of other specified functional implants: Secondary | ICD-10-CM

## 2017-11-20 DIAGNOSIS — Z9889 Other specified postprocedural states: Secondary | ICD-10-CM

## 2017-11-20 DIAGNOSIS — C9110 Chronic lymphocytic leukemia of B-cell type not having achieved remission: Secondary | ICD-10-CM

## 2017-11-20 DIAGNOSIS — J9601 Acute respiratory failure with hypoxia: Secondary | ICD-10-CM | POA: Diagnosis present

## 2017-11-20 DIAGNOSIS — M1990 Unspecified osteoarthritis, unspecified site: Secondary | ICD-10-CM | POA: Diagnosis present

## 2017-11-20 DIAGNOSIS — Z452 Encounter for adjustment and management of vascular access device: Secondary | ICD-10-CM | POA: Diagnosis not present

## 2017-11-20 DIAGNOSIS — D72829 Elevated white blood cell count, unspecified: Secondary | ICD-10-CM | POA: Diagnosis not present

## 2017-11-20 DIAGNOSIS — I959 Hypotension, unspecified: Secondary | ICD-10-CM | POA: Diagnosis not present

## 2017-11-20 DIAGNOSIS — Z885 Allergy status to narcotic agent status: Secondary | ICD-10-CM

## 2017-11-20 DIAGNOSIS — Z7984 Long term (current) use of oral hypoglycemic drugs: Secondary | ICD-10-CM

## 2017-11-20 DIAGNOSIS — R918 Other nonspecific abnormal finding of lung field: Secondary | ICD-10-CM | POA: Diagnosis not present

## 2017-11-20 DIAGNOSIS — E861 Hypovolemia: Secondary | ICD-10-CM | POA: Diagnosis not present

## 2017-11-20 DIAGNOSIS — I97638 Postprocedural hematoma of a circulatory system organ or structure following other circulatory system procedure: Secondary | ICD-10-CM | POA: Diagnosis not present

## 2017-11-20 DIAGNOSIS — J90 Pleural effusion, not elsewhere classified: Secondary | ICD-10-CM | POA: Diagnosis not present

## 2017-11-20 DIAGNOSIS — I4891 Unspecified atrial fibrillation: Secondary | ICD-10-CM | POA: Diagnosis not present

## 2017-11-20 DIAGNOSIS — H9191 Unspecified hearing loss, right ear: Secondary | ICD-10-CM | POA: Diagnosis present

## 2017-11-20 DIAGNOSIS — Z789 Other specified health status: Secondary | ICD-10-CM

## 2017-11-20 DIAGNOSIS — J168 Pneumonia due to other specified infectious organisms: Secondary | ICD-10-CM | POA: Diagnosis not present

## 2017-11-20 DIAGNOSIS — J309 Allergic rhinitis, unspecified: Secondary | ICD-10-CM | POA: Diagnosis present

## 2017-11-20 DIAGNOSIS — Z9841 Cataract extraction status, right eye: Secondary | ICD-10-CM

## 2017-11-20 DIAGNOSIS — Z923 Personal history of irradiation: Secondary | ICD-10-CM

## 2017-11-20 DIAGNOSIS — K219 Gastro-esophageal reflux disease without esophagitis: Secondary | ICD-10-CM | POA: Diagnosis present

## 2017-11-20 DIAGNOSIS — R05 Cough: Secondary | ICD-10-CM

## 2017-11-20 DIAGNOSIS — L905 Scar conditions and fibrosis of skin: Secondary | ICD-10-CM

## 2017-11-20 DIAGNOSIS — D649 Anemia, unspecified: Secondary | ICD-10-CM

## 2017-11-20 DIAGNOSIS — J9690 Respiratory failure, unspecified, unspecified whether with hypoxia or hypercapnia: Secondary | ICD-10-CM | POA: Diagnosis not present

## 2017-11-20 DIAGNOSIS — I4510 Unspecified right bundle-branch block: Secondary | ICD-10-CM | POA: Diagnosis present

## 2017-11-20 LAB — COMPREHENSIVE METABOLIC PANEL
ALBUMIN: 3.2 g/dL — AB (ref 3.5–5.0)
ALT: 26 U/L (ref 17–63)
AST: 36 U/L (ref 15–41)
Alkaline Phosphatase: 72 U/L (ref 38–126)
Anion gap: 9 (ref 5–15)
BUN: 17 mg/dL (ref 6–20)
CHLORIDE: 105 mmol/L (ref 101–111)
CO2: 22 mmol/L (ref 22–32)
Calcium: 8.8 mg/dL — ABNORMAL LOW (ref 8.9–10.3)
Creatinine, Ser: 1.07 mg/dL (ref 0.61–1.24)
GFR calc Af Amer: >60 mL/min (ref >60)
GFR calc non Af Amer: >60 mL/min (ref >60)
GLUCOSE: 256 mg/dL — AB (ref 65–99)
POTASSIUM: 4.8 mmol/L (ref 3.5–5.1)
SODIUM: 136 mmol/L (ref 135–145)
Total Bilirubin: 1.1 mg/dL (ref 0.3–1.2)
Total Protein: 6.8 g/dL (ref 6.5–8.1)

## 2017-11-20 LAB — I-STAT CG4 LACTIC ACID, ED
LACTIC ACID, VENOUS: 1.38 mmol/L (ref 0.5–1.9)
LACTIC ACID, VENOUS: 2.2 mmol/L — AB (ref 0.5–1.9)

## 2017-11-20 LAB — CBC WITH DIFFERENTIAL/PLATELET
BASOS PCT: 0 %
Basophils Absolute: 0 10*3/uL (ref 0.0–0.1)
EOS PCT: 0 %
Eosinophils Absolute: 0 10*3/uL (ref 0.0–0.7)
HEMATOCRIT: 33.3 % — AB (ref 39.0–52.0)
Hemoglobin: 11 g/dL — ABNORMAL LOW (ref 13.0–17.0)
LYMPHS ABS: 20.2 10*3/uL — AB (ref 0.7–4.0)
Lymphocytes Relative: 57 %
MCH: 32.8 pg (ref 26.0–34.0)
MCHC: 33 g/dL (ref 30.0–36.0)
MCV: 99.4 fL (ref 78.0–100.0)
MONOS PCT: 7 %
Monocytes Absolute: 2.5 10*3/uL — ABNORMAL HIGH (ref 0.1–1.0)
Neutro Abs: 12.8 10*3/uL — ABNORMAL HIGH (ref 1.7–7.7)
Neutrophils Relative %: 36 %
Platelets: 372 10*3/uL (ref 150–400)
RBC: 3.35 MIL/uL — ABNORMAL LOW (ref 4.22–5.81)
RDW: 17.7 % — AB (ref 11.5–15.5)
WBC: 35.5 10*3/uL — AB (ref 4.0–10.5)

## 2017-11-20 LAB — TROPONIN I: Troponin I: <0.03 ng/mL (ref <0.03)

## 2017-11-20 LAB — EXPECTORATED SPUTUM ASSESSMENT W REFEX TO RESP CULTURE

## 2017-11-20 LAB — EXPECTORATED SPUTUM ASSESSMENT W GRAM STAIN, RFLX TO RESP C: Special Requests: NORMAL

## 2017-11-20 LAB — PROTIME-INR
INR: 2.01
Prothrombin Time: 22.6 seconds — ABNORMAL HIGH (ref 11.4–15.2)

## 2017-11-20 LAB — BRAIN NATRIURETIC PEPTIDE: B Natriuretic Peptide: 717.8 pg/mL — ABNORMAL HIGH (ref 0.0–100.0)

## 2017-11-20 LAB — MRSA PCR SCREENING: MRSA by PCR: NEGATIVE

## 2017-11-20 LAB — GLUCOSE, CAPILLARY: Glucose-Capillary: 241 mg/dL — ABNORMAL HIGH (ref 65–99)

## 2017-11-20 LAB — CBG MONITORING, ED: Glucose-Capillary: 244 mg/dL — ABNORMAL HIGH (ref 65–99)

## 2017-11-20 LAB — STREP PNEUMONIAE URINARY ANTIGEN: STREP PNEUMO URINARY ANTIGEN: NEGATIVE

## 2017-11-20 MED ORDER — FENOFIBRATE 54 MG PO TABS
54.0000 mg | ORAL_TABLET | Freq: Every day | ORAL | Status: DC
  Administered 2017-11-21 – 2017-11-28 (×8): 54 mg via ORAL
  Filled 2017-11-20 (×9): qty 1

## 2017-11-20 MED ORDER — VANCOMYCIN HCL 10 G IV SOLR
2000.0000 mg | Freq: Once | INTRAVENOUS | Status: AC
  Administered 2017-11-20: 2000 mg via INTRAVENOUS
  Filled 2017-11-20: qty 2000

## 2017-11-20 MED ORDER — IPRATROPIUM-ALBUTEROL 0.5-2.5 (3) MG/3ML IN SOLN
3.0000 mL | RESPIRATORY_TRACT | Status: DC | PRN
  Administered 2017-11-20 – 2017-11-21 (×3): 3 mL via RESPIRATORY_TRACT
  Filled 2017-11-20 (×6): qty 3

## 2017-11-20 MED ORDER — WARFARIN - PHARMACIST DOSING INPATIENT: Freq: Every day | Status: DC

## 2017-11-20 MED ORDER — VANCOMYCIN HCL IN DEXTROSE 750-5 MG/150ML-% IV SOLN
750.0000 mg | Freq: Two times a day (BID) | INTRAVENOUS | Status: DC
  Administered 2017-11-21: 750 mg via INTRAVENOUS
  Filled 2017-11-20 (×2): qty 150

## 2017-11-20 MED ORDER — ATORVASTATIN CALCIUM 20 MG PO TABS
20.0000 mg | ORAL_TABLET | Freq: Every day | ORAL | Status: DC
  Administered 2017-11-21 – 2017-11-28 (×8): 20 mg via ORAL
  Filled 2017-11-20 (×8): qty 1

## 2017-11-20 MED ORDER — SODIUM CHLORIDE 0.9 % IV SOLN
500.0000 mg | INTRAVENOUS | Status: DC
  Administered 2017-11-20: 500 mg via INTRAVENOUS
  Filled 2017-11-20 (×2): qty 500

## 2017-11-20 MED ORDER — SODIUM CHLORIDE 0.9 % IV BOLUS
250.0000 mL | Freq: Once | INTRAVENOUS | Status: AC
  Administered 2017-11-20: 250 mL via INTRAVENOUS

## 2017-11-20 MED ORDER — IPRATROPIUM BROMIDE 0.02 % IN SOLN
0.5000 mg | Freq: Once | RESPIRATORY_TRACT | Status: AC
  Administered 2017-11-20: 0.5 mg via RESPIRATORY_TRACT
  Filled 2017-11-20: qty 2.5

## 2017-11-20 MED ORDER — ACETAMINOPHEN 500 MG PO TABS
1000.0000 mg | ORAL_TABLET | Freq: Once | ORAL | Status: AC
  Administered 2017-11-20: 1000 mg via ORAL
  Filled 2017-11-20: qty 2

## 2017-11-20 MED ORDER — GLIPIZIDE ER 2.5 MG PO TB24
2.5000 mg | ORAL_TABLET | Freq: Every day | ORAL | Status: DC | PRN
  Filled 2017-11-20: qty 1

## 2017-11-20 MED ORDER — SODIUM CHLORIDE 0.9 % IV SOLN
1.0000 g | Freq: Three times a day (TID) | INTRAVENOUS | Status: DC
  Administered 2017-11-21 (×2): 1 g via INTRAVENOUS
  Filled 2017-11-20 (×4): qty 1

## 2017-11-20 MED ORDER — SODIUM CHLORIDE 0.9 % IV SOLN: INTRAVENOUS | Status: DC

## 2017-11-20 MED ORDER — SODIUM CHLORIDE 0.9 % IV SOLN
1.0000 g | Freq: Once | INTRAVENOUS | Status: AC
  Administered 2017-11-20: 1 g via INTRAVENOUS
  Filled 2017-11-20: qty 1

## 2017-11-20 MED ORDER — INSULIN ASPART 100 UNIT/ML ~~LOC~~ SOLN
0.0000 [IU] | Freq: Every day | SUBCUTANEOUS | Status: DC
  Administered 2017-11-20: 2 [IU] via SUBCUTANEOUS
  Administered 2017-11-21: 3 [IU] via SUBCUTANEOUS
  Administered 2017-11-22: 2 [IU] via SUBCUTANEOUS
  Administered 2017-11-23: 4 [IU] via SUBCUTANEOUS
  Administered 2017-11-24: 3 [IU] via SUBCUTANEOUS
  Administered 2017-11-25: 4 [IU] via SUBCUTANEOUS
  Administered 2017-11-26 – 2017-11-27 (×2): 2 [IU] via SUBCUTANEOUS

## 2017-11-20 MED ORDER — INSULIN ASPART 100 UNIT/ML ~~LOC~~ SOLN
0.0000 [IU] | Freq: Three times a day (TID) | SUBCUTANEOUS | Status: DC
  Administered 2017-11-21: 5 [IU] via SUBCUTANEOUS
  Administered 2017-11-21 – 2017-11-22 (×2): 3 [IU] via SUBCUTANEOUS
  Administered 2017-11-22: 2 [IU] via SUBCUTANEOUS
  Administered 2017-11-22: 3 [IU] via SUBCUTANEOUS
  Administered 2017-11-23: 2 [IU] via SUBCUTANEOUS
  Administered 2017-11-23 (×2): 7 [IU] via SUBCUTANEOUS
  Administered 2017-11-24: 9 [IU] via SUBCUTANEOUS
  Administered 2017-11-24: 7 [IU] via SUBCUTANEOUS
  Administered 2017-11-25: 5 [IU] via SUBCUTANEOUS
  Administered 2017-11-25: 9 [IU] via SUBCUTANEOUS
  Administered 2017-11-25: 3 [IU] via SUBCUTANEOUS
  Administered 2017-11-26: 7 [IU] via SUBCUTANEOUS
  Administered 2017-11-26: 3 [IU] via SUBCUTANEOUS
  Administered 2017-11-26 – 2017-11-27 (×2): 2 [IU] via SUBCUTANEOUS
  Administered 2017-11-27: 7 [IU] via SUBCUTANEOUS
  Administered 2017-11-27: 5 [IU] via SUBCUTANEOUS
  Administered 2017-11-28: 9 [IU] via SUBCUTANEOUS
  Administered 2017-11-28: 2 [IU] via SUBCUTANEOUS
  Administered 2017-11-28: 9 [IU] via SUBCUTANEOUS
  Administered 2017-11-29 (×2): 1 [IU] via SUBCUTANEOUS

## 2017-11-20 MED ORDER — HYDROCODONE-ACETAMINOPHEN 5-325 MG PO TABS
1.0000 | ORAL_TABLET | Freq: Four times a day (QID) | ORAL | Status: DC | PRN
  Administered 2017-11-20: 1 via ORAL
  Administered 2017-11-21 – 2017-11-22 (×3): 2 via ORAL
  Administered 2017-11-22: 1 via ORAL
  Filled 2017-11-20: qty 2
  Filled 2017-11-20: qty 1
  Filled 2017-11-20 (×2): qty 2
  Filled 2017-11-20: qty 1

## 2017-11-20 MED ORDER — LINAGLIPTIN 5 MG PO TABS
5.0000 mg | ORAL_TABLET | Freq: Every day | ORAL | Status: DC
  Administered 2017-11-21 – 2017-11-23 (×3): 5 mg via ORAL
  Filled 2017-11-20 (×3): qty 1

## 2017-11-20 MED ORDER — ALBUTEROL SULFATE (2.5 MG/3ML) 0.083% IN NEBU
5.0000 mg | INHALATION_SOLUTION | Freq: Once | RESPIRATORY_TRACT | Status: AC
  Administered 2017-11-20: 5 mg via RESPIRATORY_TRACT
  Filled 2017-11-20: qty 6

## 2017-11-20 MED ORDER — METFORMIN HCL 500 MG PO TABS
1000.0000 mg | ORAL_TABLET | Freq: Two times a day (BID) | ORAL | Status: DC
  Administered 2017-11-21 – 2017-11-22 (×4): 1000 mg via ORAL
  Filled 2017-11-20 (×4): qty 2

## 2017-11-20 MED ORDER — ORAL CARE MOUTH RINSE
15.0000 mL | Freq: Two times a day (BID) | OROMUCOSAL | Status: DC
  Administered 2017-11-20 – 2017-11-29 (×16): 15 mL via OROMUCOSAL

## 2017-11-20 MED ORDER — CARVEDILOL 3.125 MG PO TABS
3.1250 mg | ORAL_TABLET | Freq: Two times a day (BID) | ORAL | Status: DC
  Administered 2017-11-20 – 2017-11-29 (×18): 3.125 mg via ORAL
  Filled 2017-11-20 (×17): qty 1

## 2017-11-20 MED ORDER — WARFARIN SODIUM 7.5 MG PO TABS
7.5000 mg | ORAL_TABLET | Freq: Once | ORAL | Status: AC
  Administered 2017-11-20: 7.5 mg via ORAL
  Filled 2017-11-20 (×2): qty 1

## 2017-11-20 MED ORDER — FUROSEMIDE 10 MG/ML IJ SOLN
20.0000 mg | Freq: Every day | INTRAMUSCULAR | Status: DC
  Administered 2017-11-20 – 2017-11-22 (×3): 20 mg via INTRAVENOUS
  Filled 2017-11-20 (×3): qty 2

## 2017-11-20 NOTE — H&P (Signed)
TRH H&P   Patient Demographics:    Oscar Baker, is a 84 y.o. male  MRN: [id number]   DOB - 10-21-1932  Admit Date - 11/20/2017  Outpatient Primary MD for the patient is [name], MD  Referring MD/NP/PA:  Joline Maxcy  Outpatient Specialists:  Dr. Luberta Robertson  Patient coming from: home  Chief Complaint  Patient presents with  . Cough  . Fever      HPI:    Oscar Baker  is a 84 y.o. male, w CLL, prostate cancer, Pafib, sick sinus, w h/o pacer, CHF (EF 45%) presents with c/o increase in dyspnea,  Cough with yellow/ green sputum for the past week.  Worse dyspnea today and therefore presented to ED,  Not on home o2.    In Ed,  CXR  IMPRESSION: 1. Acute LEFT lower lobe pneumonia with an associated LEFT pleural effusion. 2. Stable moderate cardiomegaly. Pulmonary venous hypertension without overt edema.  CT brain IMPRESSION: 1. No evidence of acute intracranial abnormality. 2. Small right frontotemporal scalp hematoma. 3. Mild chronic small vessel ischemic disease. 4. Persistent large right mastoid effusion. Resolved right middle ear effusion. 5. Improved paranasal sinus aeration.    Na 136, K 4.8, Bun 17, Creatinine 1.07 Ast 36, Alt 26 Glucose 256  Wbc 35.5, hgb 11.0, Plt 372 INR 2.01 BNP 717.8  LA 1.38  Pt will be admitted for pneumonia   Review of systems:    In addition to the HPI above,  No Fever-chills, No Headache, No changes with Vision or hearing, No problems swallowing food or Liquids, No Chest pain No Abdominal pain, No Nausea or Vommitting, Bowel movements are regular, No Blood in stool or Urine, No dysuria, No new skin rashes or bruises, No new joints pains-aches,  No new weakness, tingling, numbness in any extremity, No recent weight gain or loss, No polyuria, polydypsia or polyphagia, No significant Mental Stressors.  A full 10  point Review of Systems was done, except as stated above, all other Review of Systems were negative.        With Past History of the following :    Past Medical History:  Diagnosis Date  . Asthma 1950's   history of  . Atrial fibrillation ([place])   . Bilateral carpal tunnel syndrome   . Bilateral lower extremity edema   . Bladder tumor   . Chronic systolic heart failure (HCC)    Echo 1/19: Mild LVH, EF 45-50, inf HK, MAC, severe LAE, severe RAE // Echo 7/15: Mild LVH, mod focal basal sept hypertrophy, EF 55-60, AV peak and mean 16/9, trivial MR, mod LAE, PASP 38  . CLL (chronic lymphocytic leukemia) [facility]) oncologist-  dr Ilene Qua--   dx [phone] ;  Lymphocytosis, CLL - per lov note 05-11-2017 currently under active survillance,  CT 04-17-2014 show very small lymphadenopathy, no indication for treatment  . Coronary artery disease  cardiologist-  dr Cathie Olden--  08-18-2017 Intermittant risk nuclear study w/ large area of inferior infartion with no evidence ishcemia   . 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 Diabetes mellitus type 2, noninsulin dependent ([place])   . Elevated PSA    since prostatectomy but now resolved  . Hematuria 04/2017  . History of ear infection    Right  . History of MI (myocardial infarction)    per myoview nuclear study 08-18-2017 , unknown when  . History of shingles 08/2017   L ear and scalp, possible  . Hyperlipidemia   . Hypertension   . Ischemic cardiomyopathy 09/01/2017   Presumed +CAD with Nuclear stress test 08/18/17 - Inferior scar, no ischemia, intermediate risk // med management unless +angina or worse dyspnea  . OA (osteoarthritis)   . Pacemaker 02/08/2014   followed by dr g. taylor--  single chamber Biotronik due to SSS  . Permanent atrial fibrillation ([place])   . Pneumonia 2019   Left lung  . Prostate cancer [facility]) urologist-  dr Diona Fanti   dx 2004--  Gleason 8, PSA 10.45--  11-28-2002  s/p  radical prostatectomy;  recurrent w/ increasing PSA, started ADT  treatment  . RBBB (right bundle branch block)   . Sick sinus syndrome ([place])    a-Flutter with episodes of bradycardia; S/P Biotronik (serial number [id number]) 02-08-2014  . Urinary incontinence   . Wears hearing aid in right ear    receiver and transmitter      Past Surgical History:  Procedure Laterality Date  . APPENDECTOMY    . BACK SURGERY     disk  . CARDIAC CATHETERIZATION  09-03-1999  dr Cathie Olden   abnormal cardiolite study:  minor luminal irregularities but no critial coronary artery stenosis  . CARDIOVASCULAR STRESS TEST  08-18-2017  dr Cathie Olden   Intermediate risk nuclear study w/ large area inferior infarction, no evidence of ishcemia (consistant w/ prior MI)/  study not gated due to frequent PVCs  . CARPAL TUNNEL RELEASE Right 2000  . CARPAL TUNNEL RELEASE Left 11/19/2009  . CATARACT EXTRACTION Right 07/2015  . CATARACT EXTRACTION Left 09/2015  . CYSTOSCOPY N/A 11/04/2017   Procedure: CYSTOSCOPY AND CAUTERIZATION OF BLADDER;  Surgeon: Franchot Gallo, MD;  Location: [facility];  Service: Urology;  Laterality: N/A;  . INSERTION PENILE PROSTHESIS  02-22-2004    dr Mattie Marlin  [facility]  . KNEE ARTHROSCOPY Left 07/2010  . PERMANENT PACEMAKER INSERTION N/A 02/08/2014   Procedure: PERMANENT PACEMAKER INSERTION;  Surgeon: Evans Lance, MD;  Location[facility] CATH LAB;  Service: Cardiovascular;  Laterality: N/A;  . RADICAL RETROPUBIC PROSTATECTOMY W/ BILATERAL PELVIC LYMPH NODE DISSECTION  11-28-2002   dr Mattie Marlin  [facility]  . TONSILLECTOMY    . TOTAL HIP ARTHROPLASTY Left 05/12/2016   Procedure: LEFT TOTAL HIP ARTHROPLASTY ANTERIOR APPROACH;  Surgeon: Paralee Cancel, MD;  Location: [facility] ORS;  Service: Orthopedics;  Laterality: Left;  . TOTAL HIP ARTHROPLASTY Right 07-15-2006   dr Alvan Dame  [facility]  . TRANSTHORACIC ECHOCARDIOGRAM  07/20/2017   mild LVH, ef 45-50%, hypokinesis of the basal-midinferior myocardium, due to AFib unable to evaluate diastolic function/  severe LAE and RAE/   trivial PR and TR      Social History:     Social History   Tobacco Use  . Smoking status: Former Smoker    Years: 25.00    Types: Pipe, Cigars    Last attempt to quit: 11/25/1975    Years since quitting: 42.0  .  Smokeless tobacco: Never Used  Substance Use Topics  . Alcohol use: Yes    Alcohol/week: 0.0 oz    Comment: occasional     Lives - at home  Mobility - walks by self   Family History :     Family History  Problem Relation Age of Onset  . Emphysema Mother   . Allergic rhinitis Mother   . Heart attack Father   . Allergic rhinitis Brother   . Pulmonary fibrosis Brother        Home Medications:   Prior to Admission medications   Medication Sig Start Date End Date Taking? Authorizing Provider  atorvastatin (LIPITOR) 20 MG tablet TAKE 1 TABLET BY MOUTH DAILY AT 6PM 06/01/17  Yes Nahser, Wonda Cheng, MD  carvedilol (COREG) 3.125 MG tablet Take 1 tablet (3.125 mg total) by mouth 2 (two) times daily. 08/20/17 11/20/17 Yes Weaver, Scott T, PA-C  fenofibrate micronized (LOFIBRA) 200 MG capsule TAKE 1 CAPSULE BY MOUTH DAILY BEFORE BREAKFAST 06/29/17  Yes Nahser, Wonda Cheng, MD  furosemide (LASIX) 20 MG tablet Take 1 tablet (20 mg total) by mouth daily as needed. 09/01/17 11/30/17 Yes Weaver, Scott T, PA-C  glipiZIDE (GLUCOTROL XL) 2.5 MG 24 hr tablet Take 2.5 mg by mouth as needed (FOR DIABETES BS >140).    Yes [provider]  JANUVIA 100 MG tablet Take 100 mg by mouth at bedtime.  02/27/17  Yes [provider]  metFORMIN (GLUCOPHAGE) 1000 MG tablet Take 1,000 mg by mouth 2 (two) times daily with a meal.     Yes [provider]  multivitamin (THERAGRAN) per tablet Take 1 tablet by mouth daily. Will stop prior to procedure   Yes [provider]  vitamin B-12 (CYANOCOBALAMIN) 1000 MCG tablet Take 1,000 mcg by mouth daily. Will stop prior to procedure   Yes [provider]  warfarin (COUMADIN) 5 MG tablet TAKE AS DIRECTED BY COUMADIN  CLINIC Patient taking differently: Take 5-7.5 mg by mouth See admin instructions. Take 7.67m on Saturdays and 563mon all other days or TAKE AS DIRECTED BY COUMADIN CLINIC 06/02/17  Yes Nahser, PhWonda ChengMD     Allergies:     Allergies  Allergen Reactions  . Altace [Ramipril] Other (See Comments)    "throat felt like had a knot in it"  . Codeine Nausea And Vomiting    Nausea and vomiting   . Simvastatin Other (See Comments)    Leg aches     Physical Exam:   Vitals  Blood pressure 109/65, pulse (!) 58, temperature 98.1 F (36.7 C), temperature source Oral, resp. rate (!) 27, SpO2 100 %.   1. General  lying in bed in NAD,    2. Normal affect and insight, Not Suicidal or Homicidal, Awake Alert, Oriented X 3.  3. No F.N deficits, ALL C.Nerves Intact, Strength 5/5 all 4 extremities, Sensation intact all 4 extremities, Plantars down going.  4. Ears and Eyes appear Normal, Conjunctivae clear, PERRLA. Moist Oral Mucosa.  5. Supple Neck, No JVD, No cervical lymphadenopathy appriciated, No Carotid Bruits.  6. Symmetrical Chest wall movement, Good air movement bilaterally, + crackles left lower lung and right lower lung  7. RRR, No Gallops, Rubs or Murmurs, No Parasternal Heave.  8. Positive Bowel Sounds, Abdomen Soft, No tenderness, No organomegaly appriciated,No rebound -guarding or rigidity.  9.  No Cyanosis, Normal Skin Turgor, No Skin Rash or Bruise.  10. Good muscle tone,  joints appear normal , no effusions, Normal ROM.  11. No Palpable Lymph Nodes in Neck or Axillae     Data Review:    CBC Recent Labs  Lab 11/20/17 1339  WBC 35.5*  HGB 11.0*  HCT 33.3*  PLT 372  MCV 99.4  MCH 32.8  MCHC 33.0  RDW 17.7*  LYMPHSABS 20.2*  MONOABS 2.5*  EOSABS 0.0  BASOSABS 0.0   ------------------------------------------------------------------------------------------------------------------  Chemistries  Recent Labs  Lab 11/20/17 1339  NA 136  K 4.8  CL 105   CO2 22  GLUCOSE 256*  BUN 17  CREATININE 1.07  CALCIUM 8.8*  AST 36  ALT 26  ALKPHOS 72  BILITOT 1.1   ------------------------------------------------------------------------------------------------------------------ estimated creatinine clearance is 61.2 mL/min (by C-G formula based on SCr of 1.07 mg/dL). ------------------------------------------------------------------------------------------------------------------ No results for input(s): TSH, T4TOTAL, T3FREE, THYROIDAB in the last 72 hours.  Invalid input(s): FREET3  Coagulation profile Recent Labs  Lab 11/19/17 0816 11/20/17 1339  INR 2.0 2.01   ------------------------------------------------------------------------------------------------------------------- No results for input(s): DDIMER in the last 72 hours. -------------------------------------------------------------------------------------------------------------------  Cardiac Enzymes No results for input(s): CKMB, TROPONINI, MYOGLOBIN in the last 168 hours.  Invalid input(s): CK ------------------------------------------------------------------------------------------------------------------    Component Value Date/Time   BNP 717.8 (H) 11/20/2017 1339     ---------------------------------------------------------------------------------------------------------------  Urinalysis    Component Value Date/Time   COLORURINE YELLOW 07/19/2017 1545   APPEARANCEUR HAZY (A) 07/19/2017 1545   LABSPEC 1.044 (H) 07/19/2017 1545   PHURINE 6.0 07/19/2017 1545   GLUCOSEU >=500 (A) 07/19/2017 1545   HGBUR LARGE (A) 07/19/2017 1545   BILIRUBINUR NEGATIVE 07/19/2017 1545   KETONESUR 20 (A) 07/19/2017 1545   PROTEINUR 30 (A) 07/19/2017 1545   NITRITE POSITIVE (A) 07/19/2017 1545   LEUKOCYTESUR NEGATIVE 07/19/2017 1545    ----------------------------------------------------------------------------------------------------------------   Imaging Results:    Dg  Chest 2 View  Result Date: 11/20/2017 CLINICAL DATA:  84 year old with chronic cough, presenting with acute worsening, associated with congestion, shortness of breath and fever. EXAM: CHEST - 2 VIEW COMPARISON:  07/21/2017, 07/19/2017, 01/08/2015 and earlier. FINDINGS: AP ERECT and LATERAL images were obtained. LEFT subclavian single lead transvenous pacemaker with the lead tip at the expected location of the RV apex, unchanged. Cardiac silhouette moderately enlarged, unchanged since earlier this year but increased in size since 2016. Thoracic aorta mildly atherosclerotic, unchanged. Hilar and mediastinal contours otherwise unremarkable. Airspace consolidation involving the LEFT lower lobe associated with a LEFT pleural effusion. Pulmonary venous hypertension without overt edema. Degenerative changes involving the thoracic and UPPER lumbar spine. IMPRESSION: 1. Acute LEFT lower lobe pneumonia with an associated LEFT pleural effusion. 2. Stable moderate cardiomegaly. Pulmonary venous hypertension without overt edema. Electronically Signed   By: Evangeline Dakin M.D.   On: 11/20/2017 14:10   Ct Head Wo Contrast  Result Date: 11/20/2017 CLINICAL DATA:  Tripped and fell 3 days ago, hitting the head. Bruising to the right temple. On anticoagulation. Initial encounter. EXAM: CT HEAD WITHOUT CONTRAST TECHNIQUE: Contiguous axial images were obtained from the base of the skull through the vertex without intravenous contrast. COMPARISON:  07/20/2017 FINDINGS: Brain: There is no evidence of acute infarct, intracranial hemorrhage, mass, midline shift, or extra-axial fluid collection. Mild cerebral atrophy is unchanged. Periventricular white matter hypodensities are also unchanged and nonspecific but compatible with mild chronic small vessel ischemic disease. Vascular: Calcified atherosclerosis at the skull base. No hyperdense vessel. Skull: No fracture or focal osseous lesion. Sinuses/Orbits: Improved paranasal sinus  aeration. Moderate residual right ethmoid and right maxillary sinus mucosal thickening. Large right mastoid effusion, mildly increased  from prior. Resolved right middle ear opacification. At most trace fluid at the left mastoid tip. Other: Small right frontotemporal scalp hematoma. IMPRESSION: 1. No evidence of acute intracranial abnormality. 2. Small right frontotemporal scalp hematoma. 3. Mild chronic small vessel ischemic disease. 4. Persistent large right mastoid effusion. Resolved right middle ear effusion. 5. Improved paranasal sinus aeration. Electronically Signed   By: Logan Bores M.D.   On: 11/20/2017 15:14       Assessment & Plan:    Active Problems:   HCAP (healthcare-associated pneumonia)    Hcap Sputum gram stain, culture Urine strep antigen Urine legionella antigen Blood culture x2 vanco iv pharmacy to dose Cefepime iv pharmacy to dose zithromax 554m iv qday Check cbc , cmp in am  Hypertension Cont carvedilol  Pafib/ CHF Cont carvedilol  Cont coumadin pharmacy to dose  Hyperlipidemia Cont Lipitor 244mpo qhs,  Cont fenofibrate  Dm2 fsbs ac and qhs, ISS Cont Metformin Cont Glipizide Cont Januvia  CLL Check cbc in am   DVT Prophylaxis coumadin,  SCDs   AM Labs Ordered, also please review Full Orders  Family Communication: Admission, patients condition and plan of care including tests being ordered have been discussed with the patient  who indicate understanding and agree with the plan and Code Status.  Code Status DNR  Likely DC to  home  Condition GUARDED    Consults called: none  Admission status: inpatient   Time spent in minutes : 45   JaJani Gravel.D on 11/20/2017 at 7:18 PM  Between 7am to 7pm - Pager - [phone] After 7pm go to [email or web address] - password [email or web address] - Office  [phone]

## 2017-11-20 NOTE — Progress Notes (Signed)
ANTICOAGULATION CONSULT NOTE - Initial Consult  Pharmacy Consult for warfarin Indication: atrial fibrillation  Allergies  Allergen Reactions  . Altace [Ramipril] Other (See Comments)    "throat felt like had a knot in it"  . Codeine Nausea And Vomiting    Nausea and vomiting   . Simvastatin Other (See Comments)    Leg aches    Patient Measurements:    Vital Signs: Temp: 98.1 F (36.7 C) (06/01 1841) Temp Source: Oral (06/01 1841) BP: 109/65 (06/01 1845) Pulse Rate: 58 (06/01 1845)  Labs: Recent Labs    11/19/17 0816 11/20/17 1339  HGB  --  11.0*  HCT  --  33.3*  PLT  --  372  LABPROT  --  22.6*  INR 2.0 2.01  CREATININE  --  1.07    Estimated Creatinine Clearance: 61.2 mL/min (by C-G formula based on SCr of 1.07 mg/dL).  Assessment: CC/HPI: 84 y.o. male admitted on 11/20/2017 with pneumonia.   PMH: afib CLL, CAD, HTN  Anticoag: warfarin pta for afib Admit INR 2.01  PTA warfarin 7.5 mg Sat, 5 mg AOD - last dose 5/31  CHA2DS2/VAS Stroke Risk Points  Current as of 13 minutes ago     5 >= 2 Points: High Risk  1 - 1.99 Points: Medium Risk  0 Points: Low Risk    The patient's score has not changed in the past year.:  No Change     Details    This score determines the patient's risk of having a stroke if the  patient has atrial fibrillation.       Points Metrics  1 Has Congestive Heart Failure:  Yes    Current as of 13 minutes ago  0 Has Vascular Disease:  No    Current as of 13 minutes ago  1 Has Hypertension:  Yes    Current as of 13 minutes ago  2 Age:  71    Current as of 13 minutes ago  1 Has Diabetes:  Yes    Current as of 13 minutes ago  0 Had Stroke:  No  Had TIA:  No  Had thromboembolism:  No    Current as of 13 minutes ago  0 Male:  No    Current as of 13 minutes ago    Renal: SCr 1.07  Heme/Onc: H&H 11/33.3, Plt 372  Goal of Therapy:  INR 2-3 Monitor platelets by anticoagulation protocol: Yes   Plan:  Warfarin 7.5 mg x  1 Daily INR  Levester Fresh, PharmD, BCPS, BCCCP Clinical Pharmacist Clinical phone for 11/20/2017 from 0830 - 2100: [phone] If after 2100, please call main pharmacy at: [phone] 11/20/2017 7:31 PM

## 2017-11-20 NOTE — ED Triage Notes (Signed)
Patient to ED c/o worsening cough, congestion, and SOB with fever - states the cough has been going on for months. Patient also had a mechanical fall on Wednesday while trying to reach the house phone - hit his head (on coumadin). Also c/o rib cage soreness, worse with coughing and moving. A&O x 4. Denies LOC. Resp e/u at rest.

## 2017-11-20 NOTE — ED Notes (Signed)
Attempted to call report to floor 

## 2017-11-20 NOTE — Progress Notes (Addendum)
Pharmacy Antibiotic Note  Oscar Baker is a 84 y.o. male admitted on 11/20/2017 with pneumonia.  Pharmacy has been consulted for vancomycin dosing. Tmax 100.4, WBC 35.5, LA up to 2.2. SCr 1.07 on admit, CrCl~60-65.  Plan: Cefepime 1g IV ordered x 1 per EDP Vancomycin 2g IV x 1; then 750mg  IV q12h Monitor clinical progress, c/s, renal function F/u de-escalation plan/LOT, vancomycin trough as indicated F/u if cefepime to continue?  Code sepsis called at 1807 - started at 1824    Temp (24hrs), Avg:100.4 F (38 C), Min:100.4 F (38 C), Max:100.4 F (38 C)  Recent Labs  Lab 11/20/17 1339 11/20/17 1353 11/20/17 1643  WBC 35.5*  --   --   CREATININE 1.07  --   --   LATICACIDVEN  --  1.38 2.20*    Estimated Creatinine Clearance: 61.2 mL/min (by C-G formula based on SCr of 1.07 mg/dL).    Allergies  Allergen Reactions  . Altace [Ramipril] Other (See Comments)    "throat felt like had a knot in it"  . Codeine Nausea And Vomiting    Nausea and vomiting   . Simvastatin Other (See Comments)    Leg aches    Elicia [name], PharmD, BCPS Clinical Pharmacist 11/20/2017 5:03 PM

## 2017-11-20 NOTE — ED Provider Notes (Addendum)
[facility] EMERGENCY DEPARTMENT Provider Note   CSN: [id number] Arrival date & time: 11/20/17  1239     History   Chief Complaint Chief Complaint  Patient presents with  . Cough  . Fever    HPI Oscar Baker is a 84 y.o. male with a history of chronic lymphocytic leukemia, chronic atrial fibrillation on Coumadin, symptomatic bradycardia s/p Biotronik, chronic systolic CHF, DM Type II, HTN, HOH, prostate cancer s/p prostatectomy 2004 w/ biochemical relapse in 2018 on Lupron.  He presents from home with a chief complaint of dyspnea with associated productive cough with green and yellow sputum, chest congestion, and fatigue.  His symptoms have been gradually worsening over the last week.  He is extremely dyspneic and speaking in short phrases while sitting upright.  He is also diaphoretic and has accessory muscle use.  His wife also reports worsening swelling to the bilateral lower legs over the last week.  He has 20 mg of Lasix that he takes as needed.  He reports that for the last week he has been wearing diapers so he has not taken any Lasix because of how frequently it makes him void.  His wife reports that he has had a productive cough "for months",  but it is worsened over the last week.  She reports that he has been laying around and sleeping most of the week because he is felt very tired.  She reports that he was noted to have a fever when he arrived to the ED today in triage.  He also suffered a mechanical fall 4 days ago.  He reports that he was using his walker when the phone rang. He turned and started to walk without his walker when he turned to answer the phone and fell forward onto the floor.  He reports that he hit the right side of his head and his left ribs when he hit the floor.  He reports constant pain to the left ribs.  Pain is worse with coughing, deep breaths, and moving.  No alleviating factors.  No treatment prior to arrival.  He also complains of  pain to the right side of his forehead.  He notes some bruising that appeared when he awoke this morning.  He denies dizziness, lightheadedness, visual changes, syncope, numbness, or focal weakness.  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  Ambulatory with a walker.  Oncology: Dr. Zola Button; Baseline leukocytosis is ~29K.   The history is provided by the patient. No language interpreter was used.  Cough  Pertinent negatives include no chest pain, no headaches and no shortness of breath.  Fever   Associated symptoms include congestion and cough. Pertinent negatives include no chest pain, no diarrhea, no vomiting and no headaches.    Past Medical History:  Diagnosis Date  . Asthma 1950's   history of  . Atrial fibrillation ([place])   . Bilateral carpal tunnel syndrome   . Bilateral lower extremity edema   . Bladder tumor   . Chronic systolic heart failure (HCC)    Echo 1/19: Mild LVH, EF 45-50, inf HK, MAC, severe LAE, severe RAE // Echo 7/15: Mild LVH, mod focal basal sept hypertrophy, EF 55-60, AV peak and mean 16/9, trivial MR, mod LAE, PASP 38  . CLL (chronic lymphocytic leukemia) [facility]) oncologist-  dr Ilene Qua--   dx [phone] ;  Lymphocytosis, CLL - per lov note 05-11-2017 currently under active survillance,  CT 04-17-2014 show very small lymphadenopathy, no indication for treatment  . Coronary artery disease    cardiologist-  dr Cathie Olden--  08-18-2017 Intermittant risk nuclear study w/ large area of inferior infartion with no evidence ishcemia   . 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 Diabetes mellitus type 2, noninsulin dependent ([place])   . Elevated PSA    since prostatectomy but now resolved  . Hematuria 04/2017  . History of ear infection    Right  . History of MI (myocardial infarction)    per myoview nuclear study 08-18-2017 , unknown when  . History of  shingles 08/2017   L ear and scalp, possible  . Hyperlipidemia   . Hypertension   . Ischemic cardiomyopathy 09/01/2017   Presumed +CAD with Nuclear stress test 08/18/17 - Inferior scar, no ischemia, intermediate risk // med management unless +angina or worse dyspnea  . OA (osteoarthritis)   . Pacemaker 02/08/2014   followed by dr g. taylor--  single chamber Biotronik due to SSS  . Permanent atrial fibrillation ([place])   . Pneumonia 2019   Left lung  . Prostate cancer [facility]) urologist-  dr Diona Fanti   dx 2004--  Gleason 8, PSA 10.45--  11-28-2002  s/p  radical prostatectomy;  recurrent w/ increasing PSA, started ADT treatment  . RBBB (right bundle branch block)   . Sick sinus syndrome ([place])    a-Flutter with episodes of bradycardia; S/P Biotronik (serial number [id number]) 02-08-2014  . Urinary incontinence   . Wears hearing aid in right ear    receiver and transmitter    Patient Active Problem List   Diagnosis Date Noted  . Sepsis ([place])   . Hypoxemia   . Pneumonia of left lower lobe due to infectious organism ([place])   . Mediastinal lymphadenopathy   . Chronic cough 11/23/2017  . CLL (chronic lymphocytic leukemia) ([place]) 11/23/2017  . Pleural effusion 11/23/2017  . HCAP (healthcare-associated pneumonia) 11/20/2017  . Anemia 11/20/2017  . Ischemic cardiomyopathy 09/01/2017  . Right hip pain   . Acute otitis media   . Chronic systolic CHF (congestive heart failure) ([place])   . Pressure injury of skin 07/20/2017  . Malignant otitis externa 07/19/2017  . Fall at home, initial encounter 07/19/2017  . S/P left THA, AA 05/12/2016  . LPRD (laryngopharyngeal reflux disease) 05/21/2015  . Other allergic rhinitis 05/21/2015  . Obesity 12/12/2014  . Upper airway cough syndrome 12/11/2014  . Cough variant asthma 07/27/2014  . Wheezing 06/20/2014  . Symptomatic bradycardia - s/p Biotronik (serial number [id number]) 02/08/2014  . Diabetes mellitus type 2, noninsulin dependent ([place])   . Edema of  foot 03/06/2013  . Traumatic ecchymosis of right foot 03/06/2013  . Bone spur 12/30/2012  . Pain of toe of left foot 12/30/2012  . Hyperlipidemia 07/13/2012  . HTN (hypertension) 04/12/2012  . Sick sinus syndrome ([place]) 08/17/2011  . Permanent atrial fibrillation ([place]) 09/16/2010    Past Surgical History:  Procedure Laterality Date  . APPENDECTOMY    . BACK SURGERY     disk  . CARDIAC CATHETERIZATION  09-03-1999  dr Cathie Olden   abnormal cardiolite study:  minor luminal irregularities but no critial coronary artery stenosis  . CARDIOVASCULAR STRESS TEST  08-18-2017  dr Cathie Olden   Intermediate risk nuclear study w/ large area inferior infarction, no evidence of ishcemia (consistant w/ prior MI)/  study not gated due to frequent PVCs  . CARPAL TUNNEL RELEASE  Right 2000  . CARPAL TUNNEL RELEASE Left 11/19/2009  . CATARACT EXTRACTION Right 07/2015  . CATARACT EXTRACTION Left 09/2015  . CYSTOSCOPY N/A 11/04/2017   Procedure: CYSTOSCOPY AND CAUTERIZATION OF BLADDER;  Surgeon: Franchot Gallo, MD;  Location: [facility];  Service: Urology;  Laterality: N/A;  . INSERTION PENILE PROSTHESIS  02-22-2004    dr Mattie Marlin  [facility]  . IR THORACENTESIS ASP PLEURAL SPACE W/IMG GUIDE  11/26/2017  . KNEE ARTHROSCOPY Left 07/2010  . PERMANENT PACEMAKER INSERTION N/A 02/08/2014   Procedure: PERMANENT PACEMAKER INSERTION;  Surgeon: Evans Lance, MD;  Location: [facility] CATH LAB;  Service: Cardiovascular;  Laterality: N/A;  . RADICAL RETROPUBIC PROSTATECTOMY W/ BILATERAL PELVIC LYMPH NODE DISSECTION  11-28-2002   dr Mattie Marlin  [facility]  . TONSILLECTOMY    . TOTAL HIP ARTHROPLASTY Left 05/12/2016   Procedure: LEFT TOTAL HIP ARTHROPLASTY ANTERIOR APPROACH;  Surgeon: Paralee Cancel, MD;  Location: [facility];  Service: Orthopedics;  Laterality: Left;  . TOTAL HIP ARTHROPLASTY Right 07-15-2006   dr Alvan Dame  [facility]  . TRANSTHORACIC ECHOCARDIOGRAM  07/20/2017   mild LVH, ef 45-50%, hypokinesis of the basal-midinferior  myocardium, due to AFib unable to evaluate diastolic function/  severe LAE and RAE/  trivial PR and TR        Home Medications    Prior to Admission medications   Medication Sig Start Date End Date Taking? Authorizing Provider  atorvastatin (LIPITOR) 20 MG tablet TAKE 1 TABLET BY MOUTH DAILY AT 6PM 06/01/17  Yes Nahser, Wonda Cheng, MD  carvedilol (COREG) 3.125 MG tablet Take 1 tablet (3.125 mg total) by mouth 2 (two) times daily. 08/20/17 11/20/17 Yes Weaver, Scott T, PA-C  fenofibrate micronized (LOFIBRA) 200 MG capsule TAKE 1 CAPSULE BY MOUTH DAILY BEFORE BREAKFAST 06/29/17  Yes Nahser, Wonda Cheng, MD  furosemide (LASIX) 20 MG tablet Take 1 tablet (20 mg total) by mouth daily as needed. 09/01/17 11/30/17 Yes Weaver, Scott T, PA-C  glipiZIDE (GLUCOTROL XL) 2.5 MG 24 hr tablet Take 2.5 mg by mouth as needed (FOR DIABETES BS >140).    Yes [provider]  JANUVIA 100 MG tablet Take 100 mg by mouth at bedtime.  02/27/17  Yes [provider]  metFORMIN (GLUCOPHAGE) 1000 MG tablet Take 1,000 mg by mouth 2 (two) times daily with a meal.     Yes [provider]  multivitamin (THERAGRAN) per tablet Take 1 tablet by mouth daily. Will stop prior to procedure   Yes [provider]  vitamin B-12 (CYANOCOBALAMIN) 1000 MCG tablet Take 1,000 mcg by mouth daily. Will stop prior to procedure   Yes [provider]  warfarin (COUMADIN) 5 MG tablet TAKE AS DIRECTED BY COUMADIN CLINIC Patient taking differently: Take 5-7.5 mg by mouth See admin instructions. Take 7.62m on Saturdays and 554mon all other days or TAKE AS DIRECTED BY COUMADIN CLINIC 06/02/17  Yes Nahser, PhWonda ChengMD    Family History Family History  Problem Relation Age of Onset  . Emphysema Mother   . Allergic rhinitis Mother   . Heart attack Father   . Allergic rhinitis Brother   . Pulmonary fibrosis Brother     Social History Social History   Tobacco Use  . Smoking status: Former Smoker    Years:  25.00    Types: Pipe, Cigars    Last attempt to quit: 11/25/1975    Years since quitting: 42.0  . Smokeless tobacco: Never Used  Substance Use Topics  .  Alcohol use: Yes    Alcohol/week: 0.0 oz    Comment: occasional  . Drug use: No     Allergies   Altace [ramipril]; Codeine; and Simvastatin   Review of Systems Review of Systems  Constitutional: Positive for diaphoresis, fatigue and fever. Negative for appetite change.  HENT: Positive for congestion.   Respiratory: Positive for cough. Negative for shortness of breath.   Cardiovascular: Negative for chest pain.  Gastrointestinal: Negative for abdominal pain, diarrhea, nausea and vomiting.  Genitourinary: Negative for dysuria.  Musculoskeletal: Negative for back pain.  Skin: Negative for rash.  Allergic/Immunologic: Negative for immunocompromised state.  Neurological: Negative for headaches.  Psychiatric/Behavioral: Negative for confusion.     Physical Exam Updated Vital Signs BP 110/62   Pulse 75   Temp 97.9 F (36.6 C) (Oral)   Resp (!) 28   Ht _0  (1.778 m)   Wt 63 kg (138 lb 12.8 oz)   SpO2 97%   BMI 19.92 kg/m   Physical Exam  Constitutional: He is oriented to person, place, and time. He appears well-developed. No distress.  He is ill-appearing, but nontoxic.  Diaphoretic.  HENT:  Head: Normocephalic.  Eyes: Conjunctivae are normal.  Neck: Normal range of motion. Neck supple. No JVD present.  Cardiovascular: Normal rate. An irregularly irregular rhythm present. Exam reveals no gallop and no friction rub.  No murmur heard. Pulses:      Dorsalis pedis pulses are 2+ on the right side, and 2+ on the left side.  Pulmonary/Chest: Accessory muscle usage present. Tachypnea noted. No respiratory distress. He has rhonchi. He has rales in the left lower field.  Accessory muscle use.  No nasal flaring or retractions.  Tender to palpation over the left anterolateral ribs.  No overlying erythema, edema, ecchymosis,  or warmth.  Abdominal: Soft. Bowel sounds are normal. He exhibits no distension and no mass. There is no tenderness. There is no rebound and no guarding. No hernia.  Abdomen soft, nontender.  Musculoskeletal: He exhibits edema. He exhibits no tenderness.  1+ pitting edema in the bilateral lower extremities.  Neurological: He is alert and oriented to person, place, and time.  Skin: Skin is warm. Capillary refill takes less than 2 seconds. No rash noted. He is diaphoretic.  Psychiatric: His behavior is normal.  Nursing note and vitals reviewed.    ED Treatments / Results  Labs (all labs ordered are listed, but only abnormal results are displayed) Labs Reviewed  CULTURE, BLOOD (ROUTINE X 2) - Abnormal; Notable for the following components:      Result Value   Culture   (*)    Value: HAEMOPHILUS INFLUENZAE BETA LACTAMASE POSITIVE HEALTH DEPARTMENT NOTIFIED Referred to [facility] in [place] for Serotyping. Performed at [facility], [place] [address] [place], [place] [address]    All other components within normal limits  CULTURE, BLOOD (ROUTINE X 2) - Abnormal; Notable for the following components:   Culture   (*)    Value: HAEMOPHILUS INFLUENZAE BETA LACTAMASE POSITIVE HEALTH DEPARTMENT NOTIFIED Performed at [facility], [place] [address], [place], [place] [address]    All other components within normal limits  BLOOD CULTURE ID PANEL (REFLEXED) - Abnormal; Notable for the following components:   Haemophilus influenzae DETECTED (*)    All other components within normal limits  CULTURE, BLOOD (ROUTINE X 2) - Abnormal; Notable for the following components:   Culture   (*)    Value: STAPHYLOCOCCUS SPECIES (  COAGULASE NEGATIVE) THE SIGNIFICANCE OF ISOLATING THIS ORGANISM FROM A SINGLE SET OF BLOOD CULTURES WHEN MULTIPLE SETS ARE DRAWN IS UNCERTAIN. PLEASE NOTIFY THE MICROBIOLOGY DEPARTMENT WITHIN ONE WEEK IF SPECIATION AND SENSITIVITIES ARE  REQUIRED. Performed at [facility], [place] [address] [place], [place] [address]    All other components within normal limits  BLOOD CULTURE ID PANEL (REFLEXED) - Abnormal; Notable for the following components:   Staphylococcus species DETECTED (*)    Methicillin resistance DETECTED (*)    All other components within normal limits  COMPREHENSIVE METABOLIC PANEL - Abnormal; Notable for the following components:   Glucose, Bld 256 (*)    Calcium 8.8 (*)    Albumin 3.2 (*)    All other components within normal limits  CBC WITH DIFFERENTIAL/PLATELET - Abnormal; Notable for the following components:   WBC 35.5 (*)    RBC 3.35 (*)    Hemoglobin 11.0 (*)    HCT 33.3 (*)    RDW 17.7 (*)    Neutro Abs 12.8 (*)    Lymphs Abs 20.2 (*)    Monocytes Absolute 2.5 (*)    All other components within normal limits  PROTIME-INR - Abnormal; Notable for the following components:   Prothrombin Time 22.6 (*)    All other components within normal limits  BRAIN NATRIURETIC PEPTIDE - Abnormal; Notable for the following components:   B Natriuretic Peptide 717.8 (*)    All other components within normal limits  PROTIME-INR - Abnormal; Notable for the following components:   Prothrombin Time 23.4 (*)    All other components within normal limits  GLUCOSE, CAPILLARY - Abnormal; Notable for the following components:   Glucose-Capillary 241 (*)    All other components within normal limits  CBC - Abnormal; Notable for the following components:   WBC 40.6 (*)    RBC 3.10 (*)    Hemoglobin 10.1 (*)    HCT 31.0 (*)    RDW 17.9 (*)    All other components within normal limits  COMPREHENSIVE METABOLIC PANEL - Abnormal; Notable for the following components:   CO2 21 (*)    Glucose, Bld 233 (*)    Calcium 8.4 (*)    Total Protein 5.7 (*)    Albumin 2.6 (*)    Total Bilirubin 1.6 (*)    All other components within normal limits  GLUCOSE, CAPILLARY - Abnormal; Notable for the following components:    Glucose-Capillary 242 (*)    All other components within normal limits  GLUCOSE, CAPILLARY - Abnormal; Notable for the following components:   Glucose-Capillary 212 (*)    All other components within normal limits  GLUCOSE, CAPILLARY - Abnormal; Notable for the following components:   Glucose-Capillary 214 (*)    All other components within normal limits  GLUCOSE, CAPILLARY - Abnormal; Notable for the following components:   Glucose-Capillary 261 (*)    All other components within normal limits  PROTIME-INR - Abnormal; Notable for the following components:   Prothrombin Time 31.3 (*)    All other components within normal limits  BASIC METABOLIC PANEL - Abnormal; Notable for the following components:   Sodium 130 (*)    Chloride 99 (*)    Glucose, Bld 190 (*)    Calcium 8.2 (*)    All other components within normal limits  CBC - Abnormal; Notable for the following components:   WBC 47.3 (*)    RBC 3.04 (*)    Hemoglobin 10.1 (*)  HCT 30.3 (*)    RDW 18.1 (*)    All other components within normal limits  GLUCOSE, CAPILLARY - Abnormal; Notable for the following components:   Glucose-Capillary 261 (*)    All other components within normal limits  GLUCOSE, CAPILLARY - Abnormal; Notable for the following components:   Glucose-Capillary 194 (*)    All other components within normal limits  GLUCOSE, CAPILLARY - Abnormal; Notable for the following components:   Glucose-Capillary 239 (*)    All other components within normal limits  GLUCOSE, CAPILLARY - Abnormal; Notable for the following components:   Glucose-Capillary 211 (*)    All other components within normal limits  PROTIME-INR - Abnormal; Notable for the following components:   Prothrombin Time 35.9 (*)    All other components within normal limits  BASIC METABOLIC PANEL - Abnormal; Notable for the following components:   Sodium 131 (*)    Chloride 98 (*)    Glucose, Bld 226 (*)    Calcium 8.7 (*)    All other components  within normal limits  CBC - Abnormal; Notable for the following components:   WBC 60.0 (*)    RBC 3.37 (*)    Hemoglobin 10.8 (*)    HCT 33.1 (*)    RDW 18.1 (*)    Platelets 455 (*)    All other components within normal limits  GLUCOSE, CAPILLARY - Abnormal; Notable for the following components:   Glucose-Capillary 232 (*)    All other components within normal limits  GLUCOSE, CAPILLARY - Abnormal; Notable for the following components:   Glucose-Capillary 179 (*)    All other components within normal limits  GLUCOSE, CAPILLARY - Abnormal; Notable for the following components:   Glucose-Capillary 315 (*)    All other components within normal limits  PROTIME-INR - Abnormal; Notable for the following components:   Prothrombin Time 36.1 (*)    All other components within normal limits  BASIC METABOLIC PANEL - Abnormal; Notable for the following components:   Sodium 132 (*)    Chloride 96 (*)    Glucose, Bld 350 (*)    BUN 22 (*)    Calcium 8.6 (*)    All other components within normal limits  CBC - Abnormal; Notable for the following components:   WBC 46.7 (*)    RBC 3.07 (*)    Hemoglobin 9.9 (*)    HCT 29.5 (*)    RDW 18.0 (*)    Platelets 463 (*)    All other components within normal limits  GLUCOSE, CAPILLARY - Abnormal; Notable for the following components:   Glucose-Capillary 338 (*)    All other components within normal limits  GLUCOSE, CAPILLARY - Abnormal; Notable for the following components:   Glucose-Capillary 342 (*)    All other components within normal limits  GLUCOSE, CAPILLARY - Abnormal; Notable for the following components:   Glucose-Capillary 415 (*)    All other components within normal limits  GLUCOSE, CAPILLARY - Abnormal; Notable for the following components:   Glucose-Capillary 438 (*)    All other components within normal limits  GLUCOSE, RANDOM - Abnormal; Notable for the following components:   Glucose, Bld 508 (*)    All other components  within normal limits  GLUCOSE, CAPILLARY - Abnormal; Notable for the following components:   Glucose-Capillary 462 (*)    All other components within normal limits  GLUCOSE, CAPILLARY - Abnormal; Notable for the following components:   Glucose-Capillary 308 (*)    All  other components within normal limits  PROTIME-INR - Abnormal; Notable for the following components:   Prothrombin Time 23.1 (*)    All other components within normal limits  CBC WITH DIFFERENTIAL/PLATELET - Abnormal; Notable for the following components:   WBC 43.6 (*)    RBC 2.89 (*)    Hemoglobin 9.4 (*)    HCT 27.4 (*)    RDW 18.2 (*)    Platelets 507 (*)    Neutro Abs 19.6 (*)    Lymphs Abs 22.7 (*)    Monocytes Absolute 1.3 (*)    All other components within normal limits  BASIC METABOLIC PANEL - Abnormal; Notable for the following components:   Chloride 97 (*)    Glucose, Bld 297 (*)    BUN 31 (*)    Calcium 8.7 (*)    All other components within normal limits  GLUCOSE, CAPILLARY - Abnormal; Notable for the following components:   Glucose-Capillary 292 (*)    All other components within normal limits  GLUCOSE, CAPILLARY - Abnormal; Notable for the following components:   Glucose-Capillary 318 (*)    All other components within normal limits  GLUCOSE, CAPILLARY - Abnormal; Notable for the following components:   Glucose-Capillary 227 (*)    All other components within normal limits  GLUCOSE, CAPILLARY - Abnormal; Notable for the following components:   Glucose-Capillary 267 (*)    All other components within normal limits  PROTIME-INR - Abnormal; Notable for the following components:   Prothrombin Time 17.4 (*)    All other components within normal limits  CBC WITH DIFFERENTIAL/PLATELET - Abnormal; Notable for the following components:   WBC 37.3 (*)    RBC 3.01 (*)    Hemoglobin 9.8 (*)    HCT 28.9 (*)    RDW 18.5 (*)    Platelets 550 (*)    Neutro Abs 13.4 (*)    Lymphs Abs 21.3 (*)     Monocytes Absolute 2.6 (*)    All other components within normal limits  BASIC METABOLIC PANEL - Abnormal; Notable for the following components:   Chloride 100 (*)    Glucose, Bld 238 (*)    BUN 32 (*)    Calcium 8.7 (*)    All other components within normal limits  PROTEIN, TOTAL - Abnormal; Notable for the following components:   Total Protein 5.9 (*)    All other components within normal limits  GLUCOSE, CAPILLARY - Abnormal; Notable for the following components:   Glucose-Capillary 307 (*)    All other components within normal limits  GLUCOSE, CAPILLARY - Abnormal; Notable for the following components:   Glucose-Capillary 232 (*)    All other components within normal limits  LACTATE DEHYDROGENASE, PLEURAL OR PERITONEAL FLUID - Abnormal; Notable for the following components:   LD, Fluid 2,393 (*)    All other components within normal limits  BODY FLUID CELL COUNT WITH DIFFERENTIAL - Abnormal; Notable for the following components:   Appearance, Fluid CLOUDY (*)    WBC, Fluid 6,005 (*)    Neutrophil Count, Fluid 96 (*)    Monocyte-Macrophage-Serous Fluid 3 (*)    All other components within normal limits  GLUCOSE, CAPILLARY - Abnormal; Notable for the following components:   Glucose-Capillary 198 (*)    All other components within normal limits  GLUCOSE, CAPILLARY - Abnormal; Notable for the following components:   Glucose-Capillary 337 (*)    All other components within normal limits  PROTIME-INR - Abnormal; Notable for the following components:  Prothrombin Time 17.4 (*)    All other components within normal limits  CBC WITH DIFFERENTIAL/PLATELET - Abnormal; Notable for the following components:   WBC 37.7 (*)    RBC 3.15 (*)    Hemoglobin 10.2 (*)    HCT 30.2 (*)    RDW 18.6 (*)    Platelets 631 (*)    Neutro Abs 9.8 (*)    Lymphs Abs 26.8 (*)    Monocytes Absolute 1.1 (*)    All other components within normal limits  BASIC METABOLIC PANEL - Abnormal; Notable for  the following components:   Chloride 96 (*)    Glucose, Bld 180 (*)    BUN 31 (*)    All other components within normal limits  GLUCOSE, CAPILLARY - Abnormal; Notable for the following components:   Glucose-Capillary 274 (*)    All other components within normal limits  GLUCOSE, CAPILLARY - Abnormal; Notable for the following components:   Glucose-Capillary 178 (*)    All other components within normal limits  GLUCOSE, CAPILLARY - Abnormal; Notable for the following components:   Glucose-Capillary 294 (*)    All other components within normal limits  URINALYSIS, ROUTINE W REFLEX MICROSCOPIC - Abnormal; Notable for the following components:   APPearance HAZY (*)    Glucose, UA >=500 (*)    Hgb urine dipstick SMALL (*)    Leukocytes, UA MODERATE (*)    WBC, UA >50 (*)    All other components within normal limits  PROTIME-INR - Abnormal; Notable for the following components:   Prothrombin Time 16.8 (*)    All other components within normal limits  CBC WITH DIFFERENTIAL/PLATELET - Abnormal; Notable for the following components:   WBC 47.4 (*)    RBC 3.21 (*)    Hemoglobin 10.3 (*)    HCT 31.1 (*)    RDW 18.7 (*)    Platelets 671 (*)    Neutro Abs 13.7 (*)    Lymphs Abs 31.8 (*)    Monocytes Absolute 1.9 (*)    All other components within normal limits  BASIC METABOLIC PANEL - Abnormal; Notable for the following components:   Chloride 100 (*)    Glucose, Bld 155 (*)    BUN 28 (*)    All other components within normal limits  GLUCOSE, CAPILLARY - Abnormal; Notable for the following components:   Glucose-Capillary 325 (*)    All other components within normal limits  GLUCOSE, CAPILLARY - Abnormal; Notable for the following components:   Glucose-Capillary 249 (*)    All other components within normal limits  GLUCOSE, CAPILLARY - Abnormal; Notable for the following components:   Glucose-Capillary 359 (*)    All other components within normal limits  COMPREHENSIVE METABOLIC  PANEL - Abnormal; Notable for the following components:   Potassium 5.3 (*)    Chloride 97 (*)    Glucose, Bld 320 (*)    BUN 30 (*)    Total Protein 6.2 (*)    Albumin 2.5 (*)    All other components within normal limits  BLOOD GAS, ARTERIAL - Abnormal; Notable for the following components:   pH, Arterial 7.485 (*)    pO2, Arterial 55.6 (*)    Acid-Base Excess 2.1 (*)    All other components within normal limits  GLUCOSE, CAPILLARY - Abnormal; Notable for the following components:   Glucose-Capillary 419 (*)    All other components within normal limits  PROTIME-INR - Abnormal; Notable for the following components:   Prothrombin  Time 16.4 (*)    All other components within normal limits  CBC WITH DIFFERENTIAL/PLATELET - Abnormal; Notable for the following components:   WBC 51.3 (*)    RBC 3.21 (*)    Hemoglobin 10.3 (*)    HCT 31.5 (*)    RDW 18.9 (*)    Platelets 715 (*)    Neutro Abs 15.9 (*)    Lymphs Abs 34.4 (*)    All other components within normal limits  BASIC METABOLIC PANEL - Abnormal; Notable for the following components:   Chloride 99 (*)    Glucose, Bld 102 (*)    BUN 30 (*)    All other components within normal limits  GLUCOSE, CAPILLARY - Abnormal; Notable for the following components:   Glucose-Capillary 164 (*)    All other components within normal limits  GLUCOSE, CAPILLARY - Abnormal; Notable for the following components:   Glucose-Capillary 146 (*)    All other components within normal limits  GLUCOSE, CAPILLARY - Abnormal; Notable for the following components:   Glucose-Capillary 100 (*)    All other components within normal limits  GLUCOSE, CAPILLARY - Abnormal; Notable for the following components:   Glucose-Capillary 124 (*)    All other components within normal limits  GLUCOSE, CAPILLARY - Abnormal; Notable for the following components:   Glucose-Capillary 224 (*)    All other components within normal limits  GLUCOSE, CAPILLARY - Abnormal;  Notable for the following components:   Glucose-Capillary 140 (*)    All other components within normal limits  GLUCOSE, CAPILLARY - Abnormal; Notable for the following components:   Glucose-Capillary 184 (*)    All other components within normal limits  I-STAT CG4 LACTIC ACID, ED - Abnormal; Notable for the following components:   Lactic Acid, Venous 2.20 (*)    All other components within normal limits  CBG MONITORING, ED - Abnormal; Notable for the following components:   Glucose-Capillary 244 (*)    All other components within normal limits  CULTURE, EXPECTORATED SPUTUM-ASSESSMENT  MRSA PCR SCREENING  CULTURE, RESPIRATORY (NON-EXPECTORATED)  CULTURE, BLOOD (ROUTINE X 2)  GRAM STAIN  BODY FLUID CULTURE  SURGICAL PCR SCREEN  TROPONIN I  HIV ANTIBODY (ROUTINE TESTING)  STREP PNEUMONIAE URINARY ANTIGEN  LEGIONELLA PNEUMOPHILA SEROGP 1 UR AG  MAGNESIUM  LACTATE DEHYDROGENASE  AMYLASE, PLEURAL OR PERITONEAL FLUID   PROTEIN, PLEURAL OR PERITONEAL FLUID  GLUCOSE, PLEURAL OR PERITONEAL FLUID  PATHOLOGIST SMEAR REVIEW  GLUCOSE, CAPILLARY  PH, BODY FLUID  CBC  I-STAT CG4 LACTIC ACID, ED  TYPE AND SCREEN  PREPARE RBC (CROSSMATCH)  ABO/RH  CYTOLOGY - NON PAP  CYTOLOGY - NON PAP    EKG EKG Interpretation  Date/Time:  Saturday November 20 2017 16:35:27 EDT Ventricular Rate:  113 PR Interval:    QRS Duration: 171 QT Interval:  409 QTC Calculation: 478 R Axis:   -80 Text Interpretation:  Atrial fibrillation new Paired ventricular premature complexes Left bundle branch block Baseline wander in lead(s) II III V3 V4 Confirmed by Blanchie Dessert ([id number]) on 11/20/2017 4:53:18 PM   Radiology Dg Chest Port 1 View  Result Date: 11/29/2017 CLINICAL DATA:  Shortness of Breath EXAM: PORTABLE CHEST 1 VIEW COMPARISON:  11/27/2017 FINDINGS: Cardiac shadow is stable. Pacing device is again seen. Right lung is clear. Small left pleural effusion is noted. There is a rounded area of  density noted laterally likely related to a loculated component. No bony abnormality is seen. IMPRESSION: Stable appearing left pleural effusion with some degree  of loculation. Electronically Signed   By: Inez Catalina M.D.   On: 11/29/2017 08:59    Procedures .Critical Care Performed by: Joanne Gavel, PA-C Authorized by: Joanne Gavel, PA-C   Critical care provider statement:    Critical care time (minutes):  35   Critical care time was exclusive of:  Separately billable procedures and treating other patients and teaching time   Critical care was necessary to treat or prevent imminent or life-threatening deterioration of the following conditions:  Sepsis   Critical care was time spent personally by me on the following activities:  Examination of patient, obtaining history from patient or surrogate, evaluation of patient's response to treatment, development of treatment plan with patient or surrogate, ordering and review of laboratory studies, pulse oximetry, ordering and review of radiographic studies, re-evaluation of patient's condition and ordering and performing treatments and interventions   I assumed direction of critical care for this patient from another provider in my specialty: no     (including critical care time)  Medications Ordered in ED Medications  insulin aspart (novoLOG) injection 0-9 Units ( Subcutaneous Automatically Held 12/07/17 1700)  insulin aspart (novoLOG) injection 0-5 Units ( Subcutaneous Automatically Held 12/14/17 2200)  atorvastatin (LIPITOR) tablet 20 mg ( Oral Automatically Held 12/07/17 1800)  carvedilol (COREG) tablet 3.125 mg ( Oral Automatically Held 12/14/17 2200)  fenofibrate tablet 54 mg ( Oral Automatically Held 12/07/17 1000)  MEDLINE mouth rinse ( Mouth Rinse Automatically Held 12/14/17 2200)  HYDROcodone-acetaminophen (NORCO/VICODIN) 5-325 MG per tablet 1-2 tablet ( Oral MAR Hold 11/29/17 1426)  guaiFENesin (MUCINEX) 12 hr tablet 600 mg ( Oral  Automatically Held 12/07/17 2200)  cefTRIAXone (ROCEPHIN) 2 g in sodium chloride 0.9 % 100 mL IVPB ( Intravenous Automatically Held 12/07/17 1600)  chlorpheniramine-HYDROcodone (TUSSIONEX) 10-8 MG/5ML suspension 5 mL ( Oral MAR Hold 11/29/17 1426)  RESOURCE THICKENUP CLEAR ( Oral MAR Hold 11/29/17 1426)  methylPREDNISolone sodium succinate (SOLU-MEDROL) 40 mg/mL injection 40 mg ( Intravenous Automatically Held 12/11/17 1000)  lidocaine (XYLOCAINE) 2 % injection (  Not Given 11/26/17 1445)  furosemide (LASIX) tablet 20 mg ( Oral Automatically Held 12/12/17 1000)  insulin aspart (novoLOG) injection 10 Units ( Subcutaneous Automatically Held 12/14/17 1630)  ceFAZolin (ANCEF) IVPB 2g/100 mL premix (has no administration in time range)  insulin glargine (LANTUS) injection 20 Units ( Subcutaneous Automatically Held 12/14/17 2200)  ipratropium-albuterol (DUONEB) 0.5-2.5 (3) MG/3ML nebulizer solution 3 mL ( Nebulization MAR Hold 11/29/17 1426)  lactated ringers infusion ( Intravenous New Bag/Given 11/29/17 1447)  acetaminophen (TYLENOL) tablet 1,000 mg (1,000 mg Oral Given 11/20/17 1714)  sodium chloride 0.9 % bolus 250 mL (0 mLs Intravenous Stopped 11/20/17 1745)  albuterol (PROVENTIL) (2.5 MG/3ML) 0.083% nebulizer solution 5 mg (5 mg Nebulization Given 11/20/17 1714)  ipratropium (ATROVENT) nebulizer solution 0.5 mg (0.5 mg Nebulization Given 11/20/17 1715)  ceFEPIme (MAXIPIME) 1 g in sodium chloride 0.9 % 100 mL IVPB (0 g Intravenous Stopped 11/20/17 1854)  vancomycin (VANCOCIN) 2,000 mg in sodium chloride 0.9 % 500 mL IVPB (0 mg Intravenous Stopped 11/20/17 2055)  warfarin (COUMADIN) tablet 7.5 mg (7.5 mg Oral Given 11/20/17 2034)  magnesium sulfate IVPB 4 g 100 mL (0 g Intravenous Stopped 11/21/17 1358)  warfarin (COUMADIN) tablet 7.5 mg (7.5 mg Oral Given 11/21/17 1748)  acetylcysteine (MUCOMYST) 20 % nebulizer / oral solution 2 mL (2 mLs Nebulization Given 11/21/17 1331)  acetylcysteine (MUCOMYST) 20 % nebulizer / oral  solution 2 mL (2 mLs Nebulization Given 11/22/17 0900)  warfarin (COUMADIN) tablet 1 mg (1 mg Oral Given 11/22/17 1732)  furosemide (LASIX) injection 40 mg (40 mg Intravenous Given 11/26/17 0855)  insulin aspart (novoLOG) injection 10 Units (10 Units Subcutaneous Given 11/24/17 1107)  phytonadione (VITAMIN K) tablet 5 mg (5 mg Oral Given 11/24/17 1324)  insulin aspart (novoLOG) injection 10 Units (10 Units Subcutaneous Given 11/24/17 1258)  insulin aspart (novoLOG) injection 10 Units (10 Units Subcutaneous Given 11/25/17 0815)  warfarin (COUMADIN) tablet 5 mg (5 mg Oral Given 11/26/17 1646)  insulin aspart (novoLOG) injection 10 Units (10 Units Subcutaneous Given 11/28/17 1423)  insulin aspart (novoLOG) injection 8 Units (8 Units Subcutaneous Given 11/28/17 1422)     Initial Impression / Assessment and Plan / ED Course  I have reviewed the triage vital signs and the nursing notes.  Pertinent labs & imaging results that were available during my care of the patient were reviewed by me and considered in my medical decision making (see chart for details).     84 year old male with a history of chronic lymphocytic leukemia, chronic atrial fibrillation on Coumadin, symptomatic bradycardia s/p Biotronik, chronic systolic CHF, DM Type II, HTN, HOH, prostate cancer s/p prostatectomy 2004 w/ biochemical relapse in 2018 on Lupron.  Patient was seen and evaluated along with Dr. Maryan Rued, attending physician.  He presents with dyspnea, productive cough with green and yellow sputum, chest congestion, fatigue, and bilateral leg swelling that has been progressively worsening over the last week.  He also had a mechanical fall 4 days ago and has a hematoma on the right forehead and endorses left rib pain.  100.4 oral temp on arrival, 103.5 rectally. Lactate 1.38->2.20 since arrival.  Patient's baseline leukocytosis from CLL is ~28-29K, elevated to 35.5K today. Blood Cx x3 obtained.  CXR with left lower lobe pneumonia.  Cefepime  and vancomycin dosed per pharmacy.  Tylenol given.  EKG with new paired ventricular premature complexes, and continued atrial fibrillation and left bundle branch block.  Troponin is pending.  BNP 717.  Patient is tachypneic with accessory muscle use.  Albuterol and ipratropium nebulizer treatment ordered. Patient placed on [place] O2. CXR with left pleural effusion, stable moderate cardiomegaly, and pulmonary venous hypertension without overt edema.  The patient has a hematoma to the right forehead.  Ecchymosis is noted to the right, lateral periorbital area.  CT head with small right frontotemporal hematoma, but otherwise negative for acute pathology.  After breathing treatment, on reexamination tachypnea and accessory muscle use has resolved.  Spoke with Dr. Maudie Mercury, hospitalist, who will accept the patient for admission. The patient appears reasonably stabilized for admission considering the current resources, flow, and capabilities available in the ED at this time, and I doubt any other [facility] requiring further screening and/or treatment in the ED prior to admission.  Final Clinical Impressions(s) / ED Diagnoses   Final diagnoses:  Sepsis, due to unspecified organism [facility])  Pneumonia of left lower lobe due to infectious organism [facility])    ED Discharge Orders    None       Joanne Gavel, PA-C 11/20/17 Agustin Cree, MD 11/21/17 2154    Joline Maxcy A, PA-C 11/29/17 1533    Blanchie Dessert, MD 12/03/17 1431

## 2017-11-21 LAB — BLOOD CULTURE ID PANEL (REFLEXED)
Acinetobacter baumannii: NOT DETECTED
CANDIDA GLABRATA: NOT DETECTED
CANDIDA KRUSEI: NOT DETECTED
CANDIDA PARAPSILOSIS: NOT DETECTED
Candida albicans: NOT DETECTED
Candida tropicalis: NOT DETECTED
ENTEROBACTER CLOACAE COMPLEX: NOT DETECTED
ESCHERICHIA COLI: NOT DETECTED
Enterobacteriaceae species: NOT DETECTED
Enterococcus species: NOT DETECTED
Haemophilus influenzae: DETECTED — AB
KLEBSIELLA OXYTOCA: NOT DETECTED
Klebsiella pneumoniae: NOT DETECTED
LISTERIA MONOCYTOGENES: NOT DETECTED
Neisseria meningitidis: NOT DETECTED
PROTEUS SPECIES: NOT DETECTED
Pseudomonas aeruginosa: NOT DETECTED
SERRATIA MARCESCENS: NOT DETECTED
STAPHYLOCOCCUS SPECIES: NOT DETECTED
STREPTOCOCCUS PNEUMONIAE: NOT DETECTED
STREPTOCOCCUS PYOGENES: NOT DETECTED
Staphylococcus aureus (BCID): NOT DETECTED
Streptococcus agalactiae: NOT DETECTED
Streptococcus species: NOT DETECTED

## 2017-11-21 LAB — PROTIME-INR
INR: 2.1
Prothrombin Time: 23.4 seconds — ABNORMAL HIGH (ref 11.4–15.2)

## 2017-11-21 LAB — GLUCOSE, CAPILLARY
GLUCOSE-CAPILLARY: 261 mg/dL — AB (ref 65–99)
GLUCOSE-CAPILLARY: 261 mg/dL — AB (ref 65–99)
Glucose-Capillary: 242 mg/dL — ABNORMAL HIGH (ref 65–99)
Glucose-Capillary: 212 mg/dL — ABNORMAL HIGH (ref 65–99)
Glucose-Capillary: 214 mg/dL — ABNORMAL HIGH (ref 65–99)

## 2017-11-21 LAB — COMPREHENSIVE METABOLIC PANEL
ALK PHOS: 62 U/L (ref 38–126)
ALT: 21 U/L (ref 17–63)
AST: 24 U/L (ref 15–41)
Albumin: 2.6 g/dL — ABNORMAL LOW (ref 3.5–5.0)
Anion gap: 11 (ref 5–15)
BILIRUBIN TOTAL: 1.6 mg/dL — AB (ref 0.3–1.2)
BUN: 16 mg/dL (ref 6–20)
CALCIUM: 8.4 mg/dL — AB (ref 8.9–10.3)
CO2: 21 mmol/L — AB (ref 22–32)
Chloride: 105 mmol/L (ref 101–111)
Creatinine, Ser: 0.97 mg/dL (ref 0.61–1.24)
GFR calc Af Amer: >60 mL/min (ref >60)
GFR calc non Af Amer: >60 mL/min (ref >60)
Glucose, Bld: 233 mg/dL — ABNORMAL HIGH (ref 65–99)
Potassium: 4 mmol/L (ref 3.5–5.1)
SODIUM: 137 mmol/L (ref 135–145)
TOTAL PROTEIN: 5.7 g/dL — AB (ref 6.5–8.1)

## 2017-11-21 LAB — CBC
HEMATOCRIT: 31 % — AB (ref 39.0–52.0)
HEMOGLOBIN: 10.1 g/dL — AB (ref 13.0–17.0)
MCH: 32.6 pg (ref 26.0–34.0)
MCHC: 32.6 g/dL (ref 30.0–36.0)
MCV: 100 fL (ref 78.0–100.0)
Platelets: 369 10*3/uL (ref 150–400)
RBC: 3.1 MIL/uL — AB (ref 4.22–5.81)
RDW: 17.9 % — ABNORMAL HIGH (ref 11.5–15.5)
WBC: 40.6 10*3/uL — AB (ref 4.0–10.5)

## 2017-11-21 LAB — HIV ANTIBODY (ROUTINE TESTING W REFLEX): HIV Screen 4th Generation wRfx: NONREACTIVE

## 2017-11-21 MED ORDER — IPRATROPIUM-ALBUTEROL 0.5-2.5 (3) MG/3ML IN SOLN
3.0000 mL | Freq: Three times a day (TID) | RESPIRATORY_TRACT | Status: DC
  Administered 2017-11-21 – 2017-11-26 (×14): 3 mL via RESPIRATORY_TRACT
  Filled 2017-11-21 (×13): qty 3

## 2017-11-21 MED ORDER — ACETYLCYSTEINE 20 % IN SOLN
2.0000 mL | Freq: Two times a day (BID) | RESPIRATORY_TRACT | Status: AC
  Administered 2017-11-21 – 2017-11-22 (×2): 2 mL via RESPIRATORY_TRACT
  Filled 2017-11-21 (×3): qty 4

## 2017-11-21 MED ORDER — WARFARIN SODIUM 7.5 MG PO TABS
7.5000 mg | ORAL_TABLET | Freq: Once | ORAL | Status: AC
  Administered 2017-11-21: 7.5 mg via ORAL
  Filled 2017-11-21: qty 1

## 2017-11-21 MED ORDER — ACETYLCYSTEINE 10 % IN SOLN
2.0000 mL | Freq: Once | RESPIRATORY_TRACT | Status: DC
  Filled 2017-11-21 (×5): qty 2

## 2017-11-21 MED ORDER — IPRATROPIUM-ALBUTEROL 0.5-2.5 (3) MG/3ML IN SOLN: 3.0000 mL | Freq: Three times a day (TID) | RESPIRATORY_TRACT | Status: DC

## 2017-11-21 MED ORDER — GUAIFENESIN ER 600 MG PO TB12
600.0000 mg | ORAL_TABLET | Freq: Two times a day (BID) | ORAL | Status: DC
  Administered 2017-11-21 – 2017-11-28 (×16): 600 mg via ORAL
  Filled 2017-11-21 (×16): qty 1

## 2017-11-21 MED ORDER — ACETYLCYSTEINE 20 % IN SOLN
2.0000 mL | Freq: Once | RESPIRATORY_TRACT | Status: AC
  Administered 2017-11-21: 2 mL via RESPIRATORY_TRACT
  Filled 2017-11-21: qty 4
  Filled 2017-11-21: qty 2
  Filled 2017-11-21: qty 4

## 2017-11-21 MED ORDER — MAGNESIUM SULFATE 4 GM/100ML IV SOLN
4.0000 g | Freq: Once | INTRAVENOUS | Status: AC
  Administered 2017-11-21: 4 g via INTRAVENOUS
  Filled 2017-11-21: qty 100

## 2017-11-21 MED ORDER — GLIPIZIDE ER 2.5 MG PO TB24
2.5000 mg | ORAL_TABLET | Freq: Every day | ORAL | Status: DC
  Administered 2017-11-21 – 2017-11-22 (×2): 2.5 mg via ORAL
  Filled 2017-11-21 (×3): qty 1

## 2017-11-21 MED ORDER — SODIUM CHLORIDE 0.9 % IV SOLN
2.0000 g | INTRAVENOUS | Status: DC
  Administered 2017-11-21 – 2017-11-28 (×8): 2 g via INTRAVENOUS
  Filled 2017-11-21 (×10): qty 20

## 2017-11-21 NOTE — Progress Notes (Signed)
PHARMACY - PHYSICIAN COMMUNICATION CRITICAL VALUE ALERT - BLOOD CULTURE IDENTIFICATION (BCID)  Oscar Baker is an 84 y.o. male who presented to [facility] on 11/20/2017 with a chief complaint of HCAP  Name of physician (or Provider) Contacted: Dr Zigmund Daniel  Current antibiotics: Cefepime, Vanc, Azithromycin  Changes to prescribed antibiotics recommended:  DC cefepime vanc azithromycin Ceftriaxone 2 g q24h  Results for orders placed or performed during the hospital encounter of 11/20/17  Blood Culture ID Panel (Reflexed) (Collected: 11/20/2017  4:35 PM)  Result Value Ref Range   Enterococcus species NOT DETECTED NOT DETECTED   Listeria monocytogenes NOT DETECTED NOT DETECTED   Staphylococcus species NOT DETECTED NOT DETECTED   Staphylococcus aureus NOT DETECTED NOT DETECTED   Streptococcus species NOT DETECTED NOT DETECTED   Streptococcus agalactiae NOT DETECTED NOT DETECTED   Streptococcus pneumoniae NOT DETECTED NOT DETECTED   Streptococcus pyogenes NOT DETECTED NOT DETECTED   Acinetobacter baumannii NOT DETECTED NOT DETECTED   Enterobacteriaceae species NOT DETECTED NOT DETECTED   Enterobacter cloacae complex NOT DETECTED NOT DETECTED   Escherichia coli NOT DETECTED NOT DETECTED   Klebsiella oxytoca NOT DETECTED NOT DETECTED   Klebsiella pneumoniae NOT DETECTED NOT DETECTED   Proteus species NOT DETECTED NOT DETECTED   Serratia marcescens NOT DETECTED NOT DETECTED   Haemophilus influenzae DETECTED (A) NOT DETECTED   Neisseria meningitidis NOT DETECTED NOT DETECTED   Pseudomonas aeruginosa NOT DETECTED NOT DETECTED   Candida albicans NOT DETECTED NOT DETECTED   Candida glabrata NOT DETECTED NOT DETECTED   Candida krusei NOT DETECTED NOT DETECTED   Candida parapsilosis NOT DETECTED NOT DETECTED   Candida tropicalis NOT DETECTED NOT DETECTED   Levester Fresh, PharmD, BCPS, BCCCP Clinical Pharmacist Clinical phone for 11/21/2017 from 0830 - 2100: [phone] If after 2100, please call  main pharmacy at: [phone] 11/21/2017 3:56 PM

## 2017-11-21 NOTE — Progress Notes (Signed)
[place] for warfarin Indication: atrial fibrillation  Allergies  Allergen Reactions  . Altace [Ramipril] Other (See Comments)    "throat felt like had a knot in it"  . Codeine Nausea And Vomiting    Nausea and vomiting   . Simvastatin Other (See Comments)    Leg aches    Patient Measurements: Height: 5\' 10"  (177.8 cm) Weight: 138 lb 12.8 oz (63 kg) IBW/kg (Calculated) : 73  Vital Signs: Temp: 98.3 F (36.8 C) (06/02 0743) Temp Source: Oral (06/02 0743) BP: 121/63 (06/02 0743) Pulse Rate: 61 (06/02 0743)  Labs: Recent Labs    11/19/17 0816 11/20/17 1339 11/20/17 1831 11/21/17 0226  HGB  --  11.0*  --  10.1*  HCT  --  33.3*  --  31.0*  PLT  --  372  --  369  LABPROT  --  22.6*  --  23.4*  INR 2.0 2.01  --  2.10  CREATININE  --  1.07  --  0.97  TROPONINI  --   --  <0.03  --     Estimated Creatinine Clearance: 51.4 mL/min (by C-G formula based on SCr of 0.97 mg/dL).  Assessment: CC/HPI: 84 y.o. male admitted on 11/20/2017 with pneumonia. on warfarin PTA for AFib- home dose is 5mg  daily except 7.5mg  on Saturdays, admit INR 2.01  INR today remains in range at 2.1. Hgb dropped slightly, plts stable and wnl - no bleeding noted.  Goal of Therapy:  INR 2-3 Monitor platelets by anticoagulation protocol: Yes   Plan:  Warfarin 7.5 mg x 1 Daily INR  Maniah Nading D. Gaia Gullikson, PharmD, BCPS Clinical Pharmacist [phone] Please check AMION for all [place] numbers 11/21/2017 11:31 AM

## 2017-11-21 NOTE — Progress Notes (Signed)
PROGRESS NOTE    Oscar Baker  OVF:[id number] DOB: [birth year] DOA: 11/20/2017 PCP: [name], MD   Brief Narrative: 84-year-old male with a history of CLL, prostate cancer, sick sinus syndrome, pacemaker, paroxysmal atrial fibrillation, CHF with ejection fraction 45% admitted with fever increasing shortness of breath cough with yellow to green phlegm for 1 week prior to admission.  He was found to have  left lower lobe pneumonia with associated effusion and is admitted for the treatment of the same. Assessment & Plan:   Principal Problem:   HCAP (healthcare-associated pneumonia) Active Problems:   HTN (hypertension)   Hyperlipidemia   Diabetes mellitus type 2, noninsulin dependent ([place])   Anemia  1]HCAP-patient reports feeling better.still with productive cough.follow up cxr tomorrow.  His white count has gone up to 40.6.  Patient has underlying CLL and chronically elevated WBCs.  He is on triple antibiotics Vanco cefepime and azithromycin.  Follow-up labs tomorrow clinically he appears and feels better.  Follow blood cultures follow-up urine Legionella and strep pneumo.  2] paroxysmal atrial fibrillation with CHF continue carvedilol and Coumadin and Lasix.  3] history of hypertension continue carvedilol and Lasix.  4] type 2 diabetes blood sugar blood sugar has been elevated.  Patient's glipizide is ordered as as needed.  Change that to standing dose.  Continue glipizide metformin and Januvia.  And sliding scale insulin.  DVT prophylaxis: Lovenox Code Status: DO NOT RESUSCITATE Family Communication no family available Disposition Plan.  TBD   Consultants: None    Procedures: None Antimicrobials: Vanco cefepime and azithromycin  Subjective: Patient reports that he is feeling better but still has a lot of phlegm and productive cough.  He is very hard of hearing.   Objective: Vitals:   11/21/17 0426 11/21/17 0500 11/21/17 0537 11/21/17 0743  BP: 107/61  (!) 128/50 121/63    Pulse: 67 69 78 61  Resp: (!) 28 (!) 28 19 (!) 28  Temp: 97.8 F (36.6 C)  98.2 F (36.8 C) 98.3 F (36.8 C)  TempSrc: Oral  Oral Oral  SpO2: 96% 95%  95%  Weight:   63 kg (138 lb 12.8 oz)   Height:        Intake/Output Summary (Last 24 hours) at 11/21/2017 1058 Last data filed at 11/21/2017 0505 Gross per 24 hour  Intake 1860 ml  Output 650 ml  Net 1210 ml   Filed Weights   11/20/17 2100 11/21/17 0537  Weight: 97.1 kg (214 lb 1.1 oz) 63 kg (138 lb 12.8 oz)    Examination:  General exam: Appears calm and comfortable  Respiratory system: Coarse breath sounds left more than right auscultation. Respiratory effort normal. Cardiovascular system: S1 & S2 heard, RRR. No JVD, murmurs, rubs, gallops or clicks. No pedal edema. Gastrointestinal system: Abdomen is nondistended, soft and nontender. No organomegaly or masses felt. Normal bowel sounds heard. Central nervous system: Alert and oriented. No focal neurological deficits. Extremities: Trace edema bilateral lower extremities. Skin: No rashes, lesions or ulcers Psychiatry: Judgement and insight appear normal. Mood & affect appropriate.     Data Reviewed: I have personally reviewed following labs and imaging studies  CBC: Recent Labs  Lab 11/20/17 1339 11/21/17 0226  WBC 35.5* 40.6*  NEUTROABS 12.8*  --   HGB 11.0* 10.1*  HCT 33.3* 31.0*  MCV 99.4 100.0  PLT 372 841   Basic Metabolic Panel: Recent Labs  Lab 11/20/17 1339 11/21/17 0226  NA 136 137  K 4.8 4.0  CL 105 105  CO2 22 21*  GLUCOSE 256* 233*  BUN 17 16  CREATININE 1.07 0.97  CALCIUM 8.8* 8.4*   GFR: Estimated Creatinine Clearance: 51.4 mL/min (by C-G formula based on SCr of 0.97 mg/dL). Liver Function Tests: Recent Labs  Lab 11/20/17 1339 11/21/17 0226  AST 36 24  ALT 26 21  ALKPHOS 72 62  BILITOT 1.1 1.6*  PROT 6.8 5.7*  ALBUMIN 3.2* 2.6*   No results for input(s): LIPASE, AMYLASE in the last 168 hours. No results for input(s): AMMONIA  in the last 168 hours. Coagulation Profile: Recent Labs  Lab 11/19/17 0816 11/20/17 1339 11/21/17 0226  INR 2.0 2.01 2.10   Cardiac Enzymes: Recent Labs  Lab 11/20/17 1831  TROPONINI <0.03   BNP (last 3 results) No results for input(s): PROBNP in the last 8760 hours. HbA1C: No results for input(s): HGBA1C in the last 72 hours. CBG: Recent Labs  Lab 11/20/17 1632 11/20/17 2024 11/21/17 0747  GLUCAP 244* 241* 242*   Lipid Profile: No results for input(s): CHOL, HDL, LDLCALC, TRIG, CHOLHDL, LDLDIRECT in the last 72 hours. Thyroid Function Tests: No results for input(s): TSH, T4TOTAL, FREET4, T3FREE, THYROIDAB in the last 72 hours. Anemia Panel: No results for input(s): VITAMINB12, FOLATE, FERRITIN, TIBC, IRON, RETICCTPCT in the last 72 hours. Sepsis Labs: Recent Labs  Lab 11/20/17 1353 11/20/17 1643  LATICACIDVEN 1.38 2.20*    Recent Results (from the past 240 hour(s))  Culture, sputum-assessment     Status: None   Collection Time: 11/20/17  6:32 PM  Result Value Ref Range Status   Specimen Description EXPECTORATED SPUTUM  Final   Special Requests Normal  Final   Sputum evaluation   Final    THIS SPECIMEN IS ACCEPTABLE FOR SPUTUM CULTURE Performed at [facility], [place] [address] [place], [place] [address]    Report Status 11/20/2017 FINAL  Final  Culture, respiratory (NON-Expectorated)     Status: None (Preliminary result)   Collection Time: 11/20/17  6:32 PM  Result Value Ref Range Status   Specimen Description EXPECTORATED SPUTUM  Final   Special Requests Normal Reflexed from [id number]  Final   Gram Stain   Final    MODERATE WBC PRESENT, PREDOMINANTLY PMN RARE SQUAMOUS EPITHELIAL CELLS PRESENT ABUNDANT GRAM POSITIVE COCCI IN PAIRS IN CLUSTERS MODERATE GRAM NEGATIVE RODS    Culture   Final    TOO YOUNG TO READ Performed at [facility], [place] [address] [place], [place] [address]    Report Status PENDING  Incomplete  MRSA PCR Screening      Status: None   Collection Time: 11/20/17  8:28 PM  Result Value Ref Range Status   MRSA by PCR NEGATIVE NEGATIVE Final    Comment:        The GeneXpert MRSA Assay (FDA approved for NASAL specimens only), is one component of a comprehensive MRSA colonization surveillance program. It is not intended to diagnose MRSA infection nor to guide or monitor treatment for MRSA infections. Performed at [facility], [place] [address] [place], [place] [address]          Radiology Studies: Dg Chest 2 View  Result Date: 11/20/2017 CLINICAL DATA:  84 year old with chronic cough, presenting with acute worsening, associated with congestion, shortness of breath and fever. EXAM: CHEST - 2 VIEW COMPARISON:  07/21/2017, 07/19/2017, 01/08/2015 and earlier. FINDINGS: AP ERECT and LATERAL images were obtained. LEFT subclavian single lead transvenous pacemaker with the lead tip at the expected location of  the RV apex, unchanged. Cardiac silhouette moderately enlarged, unchanged since earlier this year but increased in size since 2016. Thoracic aorta mildly atherosclerotic, unchanged. Hilar and mediastinal contours otherwise unremarkable. Airspace consolidation involving the LEFT lower lobe associated with a LEFT pleural effusion. Pulmonary venous hypertension without overt edema. Degenerative changes involving the thoracic and UPPER lumbar spine. IMPRESSION: 1. Acute LEFT lower lobe pneumonia with an associated LEFT pleural effusion. 2. Stable moderate cardiomegaly. Pulmonary venous hypertension without overt edema. Electronically Signed   By: Evangeline Dakin M.D.   On: 11/20/2017 14:10   Ct Head Wo Contrast  Result Date: 11/20/2017 CLINICAL DATA:  Tripped and fell 3 days ago, hitting the head. Bruising to the right temple. On anticoagulation. Initial encounter. EXAM: CT HEAD WITHOUT CONTRAST TECHNIQUE: Contiguous axial images were obtained from the base of the skull through the vertex without intravenous  contrast. COMPARISON:  07/20/2017 FINDINGS: Brain: There is no evidence of acute infarct, intracranial hemorrhage, mass, midline shift, or extra-axial fluid collection. Mild cerebral atrophy is unchanged. Periventricular white matter hypodensities are also unchanged and nonspecific but compatible with mild chronic small vessel ischemic disease. Vascular: Calcified atherosclerosis at the skull base. No hyperdense vessel. Skull: No fracture or focal osseous lesion. Sinuses/Orbits: Improved paranasal sinus aeration. Moderate residual right ethmoid and right maxillary sinus mucosal thickening. Large right mastoid effusion, mildly increased from prior. Resolved right middle ear opacification. At most trace fluid at the left mastoid tip. Other: Small right frontotemporal scalp hematoma. IMPRESSION: 1. No evidence of acute intracranial abnormality. 2. Small right frontotemporal scalp hematoma. 3. Mild chronic small vessel ischemic disease. 4. Persistent large right mastoid effusion. Resolved right middle ear effusion. 5. Improved paranasal sinus aeration. Electronically Signed   By: Logan Bores M.D.   On: 11/20/2017 15:14        Scheduled Meds: . acetylcysteine  2 mL Nebulization Once  . atorvastatin  20 mg Oral q1800  . carvedilol  3.125 mg Oral BID  . fenofibrate  54 mg Oral Daily  . furosemide  20 mg Intravenous Daily  . insulin aspart  0-5 Units Subcutaneous QHS  . insulin aspart  0-9 Units Subcutaneous TID WC  . linagliptin  5 mg Oral Daily  . mouth rinse  15 mL Mouth Rinse BID  . metFORMIN  1,000 mg Oral BID WC  . Warfarin - Pharmacist Dosing Inpatient   Does not apply q1800   Continuous Infusions: . azithromycin Stopped (11/20/17 2318)  . ceFEPime (MAXIPIME) IV 1 g (11/21/17 0931)  . vancomycin Stopped (11/21/17 5621)     LOS: 1 day     Georgette Shell, MD [facility]  If 7PM-7AM, please contact night-coverage [email or web address] Password [facility] 11/21/2017, 10:58 AM

## 2017-11-22 ENCOUNTER — Ambulatory Visit: Payer: Medicare Other | Admitting: Physical Therapy

## 2017-11-22 ENCOUNTER — Telehealth: Payer: Self-pay | Admitting: *Deleted

## 2017-11-22 ENCOUNTER — Inpatient Hospital Stay (HOSPITAL_COMMUNITY): Payer: Medicare Other

## 2017-11-22 LAB — BASIC METABOLIC PANEL
ANION GAP: 9 (ref 5–15)
BUN: 17 mg/dL (ref 6–20)
CO2: 22 mmol/L (ref 22–32)
Calcium: 8.2 mg/dL — ABNORMAL LOW (ref 8.9–10.3)
Chloride: 99 mmol/L — ABNORMAL LOW (ref 101–111)
Creatinine, Ser: 0.82 mg/dL (ref 0.61–1.24)
GFR calc Af Amer: >60 mL/min (ref >60)
GLUCOSE: 190 mg/dL — AB (ref 65–99)
POTASSIUM: 4.4 mmol/L (ref 3.5–5.1)
Sodium: 130 mmol/L — ABNORMAL LOW (ref 135–145)

## 2017-11-22 LAB — LEGIONELLA PNEUMOPHILA SEROGP 1 UR AG: L. pneumophila Serogp 1 Ur Ag: NEGATIVE

## 2017-11-22 LAB — GLUCOSE, CAPILLARY
GLUCOSE-CAPILLARY: 194 mg/dL — AB (ref 65–99)
Glucose-Capillary: 239 mg/dL — ABNORMAL HIGH (ref 65–99)
Glucose-Capillary: 211 mg/dL — ABNORMAL HIGH (ref 65–99)
Glucose-Capillary: 232 mg/dL — ABNORMAL HIGH (ref 65–99)

## 2017-11-22 LAB — CBC
HEMATOCRIT: 30.3 % — AB (ref 39.0–52.0)
HEMOGLOBIN: 10.1 g/dL — AB (ref 13.0–17.0)
MCH: 33.2 pg (ref 26.0–34.0)
MCHC: 33.3 g/dL (ref 30.0–36.0)
MCV: 99.7 fL (ref 78.0–100.0)
Platelets: 381 10*3/uL (ref 150–400)
RBC: 3.04 MIL/uL — ABNORMAL LOW (ref 4.22–5.81)
RDW: 18.1 % — AB (ref 11.5–15.5)
WBC: 47.3 10*3/uL — ABNORMAL HIGH (ref 4.0–10.5)

## 2017-11-22 LAB — MAGNESIUM: Magnesium: 1.9 mg/dL (ref 1.7–2.4)

## 2017-11-22 LAB — PROTIME-INR
INR: 3.05
Prothrombin Time: 31.3 seconds — ABNORMAL HIGH (ref 11.4–15.2)

## 2017-11-22 MED ORDER — FUROSEMIDE 10 MG/ML IJ SOLN
40.0000 mg | Freq: Every day | INTRAMUSCULAR | Status: DC
  Administered 2017-11-23: 40 mg via INTRAVENOUS
  Filled 2017-11-22: qty 4

## 2017-11-22 MED ORDER — WARFARIN SODIUM 1 MG PO TABS
1.0000 mg | ORAL_TABLET | Freq: Once | ORAL | Status: AC
Start: 2017-11-22 — End: 2017-11-22
  Administered 2017-11-22: 1 mg via ORAL
  Filled 2017-11-22: qty 1

## 2017-11-22 NOTE — Progress Notes (Signed)
Inpatient Diabetes Program Recommendations  AACE/ADA: New Consensus Statement on Inpatient Glycemic Control (2015)  Target Ranges:  Prepandial:   less than 140 mg/dL      Peak postprandial:   less than 180 mg/dL (1-2 hours)      Critically ill patients:  140 - 180 mg/dL   Lab Results  Component Value Date   GLUCAP 239 (H) 11/22/2017   HGBA1C 6.0 (H) 05/05/2016    Review of Glycemic Control Results for CHADRIC, Oscar Baker (MRN [id number]) as of 11/22/2017 12:18  Ref. Range 11/21/2017 16:22 11/21/2017 21:10 11/22/2017 07:46 11/22/2017 12:03  Glucose-Capillary Latest Ref Range: 65 - 99 mg/dL 261 (H) 261 (H) 194 (H) 239 (H)   Diabetes history: Type 2 DM Outpatient Diabetes medications: Glipizide 2.5 mg QD at lunch, Januvia 100 mg QD, Metformin 1000 mg BID Current orders for Inpatient glycemic control: Glipizide 2.5 mg QD at lunch, Tradjenta 5 mg QD, Metformin 1000 mg BID, Novolog 0-9 units TID, Novolog 0-5 units QHS  Inpatient Diabetes Program Recommendations:    Last noted A1C was from 04/2016, consider repeating A1C?  In the setting of infection, consider discontinuing oral agents to establish better glycemic control and add basal bolus regimen. Consider Lantus 12 units QD and Novolog 3 units TID (assuming patient is consuming >50%).  Thanks Bronson Curb, MSN, RNC-OB Diabetes Coordinator [phone] (8a-5p)

## 2017-11-22 NOTE — Progress Notes (Signed)
[facility] for Warfarin Indication: atrial fibrillation  Allergies  Allergen Reactions  . Altace [Ramipril] Other (See Comments)    "throat felt like had a knot in it"  . Codeine Nausea And Vomiting    Nausea and vomiting   . Simvastatin Other (See Comments)    Leg aches    Patient Measurements: Height: 5\' 10"  (177.8 cm) Weight: 138 lb 12.8 oz (63 kg) IBW/kg (Calculated) : 73  Vital Signs: Temp: 97.9 F (36.6 C) (06/03 0748) Temp Source: Oral (06/03 0748) BP: 138/66 (06/03 0748) Pulse Rate: 65 (06/03 0748)  Labs: Recent Labs    11/20/17 1339 11/20/17 1831 11/21/17 0226 11/22/17 0241  HGB 11.0*  --  10.1* 10.1*  HCT 33.3*  --  31.0* 30.3*  PLT 372  --  369 381  LABPROT 22.6*  --  23.4* 31.3*  INR 2.01  --  2.10 3.05  CREATININE 1.07  --  0.97 0.82  TROPONINI  --  <0.03  --   --     Estimated Creatinine Clearance: 60.8 mL/min (by C-G formula based on SCr of 0.82 mg/dL).  Assessment: 84 y.o. male admitted on 11/20/2017 with pneumonia. on warfarin PTA for AFib- home dose is 5mg  daily except 7.5mg  on Saturdays, admit INR 2.01  INR today is slightly SUPRAtherapeutic with a large jump up (INR 3.05 << 2.1, goal of 2-3). The jump up in INR may be related to the patient's sepsis, recent antibiotics, or oral intake. Given the jump - will give a small dose this evening and monitor trends. CBC stable - no bleeding noted.   Goal of Therapy:  INR 2-3 Monitor platelets by anticoagulation protocol: Yes   Plan:  - Warfarin 1 mg x 1 dose at 1800 today - Will continue to monitor for any signs/symptoms of bleeding and will follow up with PT/INR in the a.m.   Thank you for allowing pharmacy to be a part of this patient's care.  Alycia Rossetti, PharmD, BCPS Clinical Pharmacist Pager: [phone] Clinical phone for 11/22/2017 from 7a-3:30p: [phone] If after 3:30p, please call main pharmacy at: [phone] 11/22/2017 9:48 AM

## 2017-11-22 NOTE — Telephone Encounter (Signed)
Patient called to report he was in the [place] pt to call back once discharged from the Hospital.

## 2017-11-22 NOTE — Progress Notes (Signed)
PROGRESS NOTE    Oscar Baker  RCV:[id number] DOB: 08/03/1932 DOA: 11/20/2017 PCP: [name], MD  Brief Narrative:84year-old male with a history of CLL, prostate cancer, sick sinus syndrome, pacemaker, paroxysmal atrial fibrillation, CHF with ejection fraction 45% admitted with fever increasing shortness of breath cough with yellow to green phlegm for 1 week prior to admission.  He was found to have  left lower lobe pneumonia with associated effusion and is admitted for the treatment of the same.   Assessment & Plan:   Principal Problem:   HCAP (healthcare-associated pneumonia) Active Problems:   HTN (hypertension)   Hyperlipidemia   Diabetes mellitus type 2, noninsulin dependent ([place])   Anemia  1]HCAP-patient reports feeling better.still with productive cough.follow up cxr showed increasing interstitial and airspace disease of the left lung consistent with infection increasing left effusion and progressive edema in the right lung. Vanco and azithromycin has been DC'd yesterday and continue ceftriaxone.  Blood culture came back positive for H. influenzae.  Increasing leukocytosis secondary to CLL.  2] paroxysmal atrial fibrillation with CHF continue carvedilol and Coumadin.  Increase the dose of Lasix to 40 mg daily.  Patient is not on potassium replacement acute remains normal at 4.4 follow-up tomorrow.  3] history of hypertension continue carvedilol and Lasix.  4] type 2 diabetes blood sugar blood sugar has been elevated.  Patient's glipizide is ordered as as needed.  Change that to standing dose.  Continue glipizide metformin and Januvia.  And sliding scale insulin.  5]hyponatremia-?  Secondary to pneumonia follow-up levels.      DVT prophylaxis: Coumadin Code Status: DNR Family Communication: No family available Disposition Plan: TBD   Consultants: None  Procedures: None  Antimicrobials: Ceftriaxone Subjective: Patient resting in bed feeling better requested an  extra dose of Mucomyst yesterday.  Objective: Vitals:   11/22/17 0400 11/22/17 0748 11/22/17 0904 11/22/17 1204  BP: (!) 121/58 138/66  110/67  Pulse: 64 65  67  Resp:  (!) 25  (!) 21  Temp: 98.1 F (36.7 C) 97.9 F (36.6 C)  97.8 F (36.6 C)  TempSrc: Oral Oral  Oral  SpO2: 97% 95% 96% 96%  Weight:      Height:        Intake/Output Summary (Last 24 hours) at 11/22/2017 1218 Last data filed at 11/22/2017 1115 Gross per 24 hour  Intake 100 ml  Output 2200 ml  Net -2100 ml   Filed Weights   11/20/17 2100 11/21/17 0537  Weight: 97.1 kg (214 lb 1.1 oz) 63 kg (138 lb 12.8 oz)    Examination:  General exam: Appears calm and comfortable  Respiratory system: Coarse breath sounds to auscultation. Respiratory effort normal. Cardiovascular system: S1 & S2 heard, RRR. No JVD, murmurs, rubs, gallops or clicks. No pedal edema. Gastrointestinal system: Abdomen is nondistended, soft and nontender. No organomegaly or masses felt. Normal bowel sounds heard. Central nervous system: Alert and oriented. No focal neurological deficits. Extremities: Symmetric 5 x 5 power. Skin: No rashes, lesions or ulcers Psychiatry: Judgement and insight appear normal. Mood & affect appropriate.     Data Reviewed: I have personally reviewed following labs and imaging studies  CBC: Recent Labs  Lab 11/20/17 1339 11/21/17 0226 11/22/17 0241  WBC 35.5* 40.6* 47.3*  NEUTROABS 12.8*  --   --   HGB 11.0* 10.1* 10.1*  HCT 33.3* 31.0* 30.3*  MCV 99.4 100.0 99.7  PLT 372 369 102   Basic Metabolic Panel: Recent Labs  Lab 11/20/17 1339 11/21/17  0226 11/22/17 0241  NA 136 137 130*  K 4.8 4.0 4.4  CL 105 105 99*  CO2 22 21* 22  GLUCOSE 256* 233* 190*  BUN 17 16 17   CREATININE 1.07 0.97 0.82  CALCIUM 8.8* 8.4* 8.2*  MG  --   --  1.9   GFR: Estimated Creatinine Clearance: 60.8 mL/min (by C-G formula based on SCr of 0.82 mg/dL). Liver Function Tests: Recent Labs  Lab 11/20/17 1339 11/21/17 0226   AST 36 24  ALT 26 21  ALKPHOS 72 62  BILITOT 1.1 1.6*  PROT 6.8 5.7*  ALBUMIN 3.2* 2.6*   No results for input(s): LIPASE, AMYLASE in the last 168 hours. No results for input(s): AMMONIA in the last 168 hours. Coagulation Profile: Recent Labs  Lab 11/19/17 0816 11/20/17 1339 11/21/17 0226 11/22/17 0241  INR 2.0 2.01 2.10 3.05   Cardiac Enzymes: Recent Labs  Lab 11/20/17 1831  TROPONINI <0.03   BNP (last 3 results) No results for input(s): PROBNP in the last 8760 hours. HbA1C: No results for input(s): HGBA1C in the last 72 hours. CBG: Recent Labs  Lab 11/21/17 1400 11/21/17 1622 11/21/17 2110 11/22/17 0746 11/22/17 1203  GLUCAP 214* 261* 261* 194* 239*   Lipid Profile: No results for input(s): CHOL, HDL, LDLCALC, TRIG, CHOLHDL, LDLDIRECT in the last 72 hours. Thyroid Function Tests: No results for input(s): TSH, T4TOTAL, FREET4, T3FREE, THYROIDAB in the last 72 hours. Anemia Panel: No results for input(s): VITAMINB12, FOLATE, FERRITIN, TIBC, IRON, RETICCTPCT in the last 72 hours. Sepsis Labs: Recent Labs  Lab 11/20/17 1353 11/20/17 1643  LATICACIDVEN 1.38 2.20*    Recent Results (from the past 240 hour(s))  Blood culture (routine x 2)     Status: None (Preliminary result)   Collection Time: 11/20/17  4:30 PM  Result Value Ref Range Status   Specimen Description BLOOD RIGHT ANTECUBITAL  Final   Special Requests   Final    BOTTLES DRAWN AEROBIC AND ANAEROBIC Blood Culture adequate volume   Culture  Setup Time   Final    GRAM NEGATIVE RODS IN BOTH AEROBIC AND ANAEROBIC BOTTLES CRITICAL RESULT CALLED TO, READ BACK BY AND VERIFIED WITH: Ailene Rud AT 1610 11/21/17 BY L BENFIELD Performed at [facility], [place] [address], [place], [place] [address]    Culture GRAM NEGATIVE RODS  Final   Report Status PENDING  Incomplete  Blood culture (routine x 2)     Status: Abnormal (Preliminary result)   Collection Time: 11/20/17  4:35 PM  Result Value Ref  Range Status   Specimen Description BLOOD RIGHT HAND  Final   Special Requests   Final    BOTTLES DRAWN AEROBIC AND ANAEROBIC Blood Culture adequate volume   Culture  Setup Time   Final    GRAM NEGATIVE RODS IN BOTH AEROBIC AND ANAEROBIC BOTTLES CRITICAL RESULT CALLED TO, READ BACK BY AND VERIFIED WITH: M MACCIA,PHARMD AT 4098 11/21/17 BY L BENFIELD    Culture (A)  Final    HAEMOPHILUS INFLUENZAE BETA LACTAMASE POSITIVE Performed at [facility], [place] [address] [place], [place] [address]    Report Status PENDING  Incomplete  Blood Culture ID Panel (Reflexed)     Status: Abnormal   Collection Time: 11/20/17  4:35 PM  Result Value Ref Range Status   Enterococcus species NOT DETECTED NOT DETECTED Final   Listeria monocytogenes NOT DETECTED NOT DETECTED Final   Staphylococcus species NOT DETECTED NOT DETECTED Final   Staphylococcus aureus  NOT DETECTED NOT DETECTED Final   Streptococcus species NOT DETECTED NOT DETECTED Final   Streptococcus agalactiae NOT DETECTED NOT DETECTED Final   Streptococcus pneumoniae NOT DETECTED NOT DETECTED Final   Streptococcus pyogenes NOT DETECTED NOT DETECTED Final   Acinetobacter baumannii NOT DETECTED NOT DETECTED Final   Enterobacteriaceae species NOT DETECTED NOT DETECTED Final   Enterobacter cloacae complex NOT DETECTED NOT DETECTED Final   Escherichia coli NOT DETECTED NOT DETECTED Final   Klebsiella oxytoca NOT DETECTED NOT DETECTED Final   Klebsiella pneumoniae NOT DETECTED NOT DETECTED Final   Proteus species NOT DETECTED NOT DETECTED Final   Serratia marcescens NOT DETECTED NOT DETECTED Final   Haemophilus influenzae DETECTED (A) NOT DETECTED Final    Comment: CRITICAL RESULT CALLED TO, READ BACK BY AND VERIFIED WITH: M MACCIA,PHARMD AT 1547 11/21/17 BY L BENFIELD    Neisseria meningitidis NOT DETECTED NOT DETECTED Final   Pseudomonas aeruginosa NOT DETECTED NOT DETECTED Final   Candida albicans NOT DETECTED NOT DETECTED Final    Candida glabrata NOT DETECTED NOT DETECTED Final   Candida krusei NOT DETECTED NOT DETECTED Final   Candida parapsilosis NOT DETECTED NOT DETECTED Final   Candida tropicalis NOT DETECTED NOT DETECTED Final    Comment: Performed at [facility], [place]. [address] [place], [place] [address]  Culture, sputum-assessment     Status: None   Collection Time: 11/20/17  6:32 PM  Result Value Ref Range Status   Specimen Description EXPECTORATED SPUTUM  Final   Special Requests Normal  Final   Sputum evaluation   Final    THIS SPECIMEN IS ACCEPTABLE FOR SPUTUM CULTURE Performed at [facility], [address] [place], [place] [address]    Report Status 11/20/2017 FINAL  Final  Culture, respiratory (NON-Expectorated)     Status: None (Preliminary result)   Collection Time: 11/20/17  6:32 PM  Result Value Ref Range Status   Specimen Description EXPECTORATED SPUTUM  Final   Special Requests Normal Reflexed from [id number]  Final   Gram Stain   Final    MODERATE WBC PRESENT, PREDOMINANTLY PMN RARE SQUAMOUS EPITHELIAL CELLS PRESENT ABUNDANT GRAM POSITIVE COCCI IN PAIRS IN CLUSTERS MODERATE GRAM NEGATIVE RODS    Culture   Final    CULTURE REINCUBATED FOR BETTER GROWTH Performed at [facility], [place] [address] [place], [place] [address]    Report Status PENDING  Incomplete  MRSA PCR Screening     Status: None   Collection Time: 11/20/17  8:28 PM  Result Value Ref Range Status   MRSA by PCR NEGATIVE NEGATIVE Final    Comment:        The GeneXpert MRSA Assay (FDA approved for NASAL specimens only), is one component of a comprehensive MRSA colonization surveillance program. It is not intended to diagnose MRSA infection nor to guide or monitor treatment for MRSA infections. Performed at [facility], [place] [address], [place], [place] [address]          Radiology Studies: Dg Chest 1 View  Result Date: 11/22/2017 CLINICAL DATA:  Progressive cough, congestion, and  shortness of breath. Fall. EXAM: CHEST  1 VIEW COMPARISON:  Two-view chest x-ray 11/20/2017 FINDINGS: The heart size is exaggerated by low lung volumes. Increased asymmetric left-sided interstitial and airspace disease is noted. A left pleural effusion is present. Right-sided interstitial disease is increasing. IMPRESSION: 1. Increasing interstitial and airspace disease in the left lung consistent with edema and infection. 2. Increasing left  pleural effusion. 3. Progressive edema in the right lung. Electronically Signed   By: San Morelle M.D.   On: 11/22/2017 07:34   Dg Chest 2 View  Result Date: 11/20/2017 CLINICAL DATA:  84 year old with chronic cough, presenting with acute worsening, associated with congestion, shortness of breath and fever. EXAM: CHEST - 2 VIEW COMPARISON:  07/21/2017, 07/19/2017, 01/08/2015 and earlier. FINDINGS: AP ERECT and LATERAL images were obtained. LEFT subclavian single lead transvenous pacemaker with the lead tip at the expected location of the RV apex, unchanged. Cardiac silhouette moderately enlarged, unchanged since earlier this year but increased in size since 2016. Thoracic aorta mildly atherosclerotic, unchanged. Hilar and mediastinal contours otherwise unremarkable. Airspace consolidation involving the LEFT lower lobe associated with a LEFT pleural effusion. Pulmonary venous hypertension without overt edema. Degenerative changes involving the thoracic and UPPER lumbar spine. IMPRESSION: 1. Acute LEFT lower lobe pneumonia with an associated LEFT pleural effusion. 2. Stable moderate cardiomegaly. Pulmonary venous hypertension without overt edema. Electronically Signed   By: Evangeline Dakin M.D.   On: 11/20/2017 14:10   Ct Head Wo Contrast  Result Date: 11/20/2017 CLINICAL DATA:  Tripped and fell 3 days ago, hitting the head. Bruising to the right temple. On anticoagulation. Initial encounter. EXAM: CT HEAD WITHOUT CONTRAST TECHNIQUE: Contiguous axial images were  obtained from the base of the skull through the vertex without intravenous contrast. COMPARISON:  07/20/2017 FINDINGS: Brain: There is no evidence of acute infarct, intracranial hemorrhage, mass, midline shift, or extra-axial fluid collection. Mild cerebral atrophy is unchanged. Periventricular white matter hypodensities are also unchanged and nonspecific but compatible with mild chronic small vessel ischemic disease. Vascular: Calcified atherosclerosis at the skull base. No hyperdense vessel. Skull: No fracture or focal osseous lesion. Sinuses/Orbits: Improved paranasal sinus aeration. Moderate residual right ethmoid and right maxillary sinus mucosal thickening. Large right mastoid effusion, mildly increased from prior. Resolved right middle ear opacification. At most trace fluid at the left mastoid tip. Other: Small right frontotemporal scalp hematoma. IMPRESSION: 1. No evidence of acute intracranial abnormality. 2. Small right frontotemporal scalp hematoma. 3. Mild chronic small vessel ischemic disease. 4. Persistent large right mastoid effusion. Resolved right middle ear effusion. 5. Improved paranasal sinus aeration. Electronically Signed   By: Logan Bores M.D.   On: 11/20/2017 15:14        Scheduled Meds: . atorvastatin  20 mg Oral q1800  . carvedilol  3.125 mg Oral BID  . fenofibrate  54 mg Oral Daily  . furosemide  20 mg Intravenous Daily  . glipiZIDE  2.5 mg Oral Q lunch  . guaiFENesin  600 mg Oral BID  . insulin aspart  0-5 Units Subcutaneous QHS  . insulin aspart  0-9 Units Subcutaneous TID WC  . ipratropium-albuterol  3 mL Nebulization TID  . linagliptin  5 mg Oral Daily  . mouth rinse  15 mL Mouth Rinse BID  . metFORMIN  1,000 mg Oral BID WC  . warfarin  1 mg Oral ONCE-1800  . Warfarin - Pharmacist Dosing Inpatient   Does not apply q1800   Continuous Infusions: . cefTRIAXone (ROCEPHIN)  IV Stopped (11/21/17 2000)     LOS: 2 days     Georgette Shell, MD [facility] If 7PM-7AM, please contact night-coverage [email or web address] Password [email or web address] 11/22/2017, 12:18 PM

## 2017-11-22 NOTE — Plan of Care (Signed)
Discussed plan of care with patient.  Encouraged patient to drink more water because it will loosen the mucus in his chest make it easier to cough up.  Patient was receptive to the suggestion and displayed good teach back.

## 2017-11-22 NOTE — Evaluation (Signed)
Physical Therapy Evaluation Patient Details Name: Oscar Baker MRN: [id number] DOB: 28-Jul-1932 Today's Date: 11/22/2017   History of Present Illness  84 year old male with a history of CLL, prostate cancer, sick sinus syndrome, pacemaker, paroxysmal atrial fibrillation, CHF with ejection fraction 45% admitted with fever increasing shortness of breath cough with yellow to green phlegm for 1 week prior to admission.  He was found to have  left lower lobe pneumonia with associated effusion  Clinical Impression  PTA pt independent with ambulation with RW, independent with iADLs. Pt with history of falls and working with outpatient PT on balance and mobility. Pt currently limited in his safe mobility by oxygen desaturation (see General Comments) and decreased strength and endurance. Pt is currently mod I for bed mobility , minA for transfers and min guard for ambulation of 200 feet with RW. PT recommends pt return to Outpatient PT at [facility] facility at d/c to resume balance and mobility training. PT will follow acutely    Follow Up Recommendations Outpatient PT;Supervision/Assistance - 24 hour    Equipment Recommendations  Other (comment)(pt came with RW and it has been lost in hospital may need ne)    Recommendations for Other Services       Precautions / Restrictions Precautions Precautions: Fall Restrictions Weight Bearing Restrictions: No      Mobility  Bed Mobility Overal bed mobility: Modified Independent(requires increased time and effort)             General bed mobility comments: utilizes bedrail to pull to EoB  Transfers Overall transfer level: Needs assistance   Transfers: Sit to/from Stand Sit to Stand: Min assist         General transfer comment: able to power up however requires minA to steady at RW  Ambulation/Gait Ambulation/Gait assistance: Min guard Ambulation Distance (Feet): 200 Feet Assistive device: Rolling walker (2 wheeled) Gait  Pattern/deviations: Step-through pattern;Decreased stride length;Trunk flexed;Shuffle     General Gait Details: hands-on min guard for safety, vc for upright posture, slow, steady gait. Pt with 3/4 DoE by end of ambulation      Balance Overall balance assessment: Needs assistance Sitting-balance support: No upper extremity supported;Feet supported Sitting balance-Leahy Scale: Fair     Standing balance support: Bilateral upper extremity supported Standing balance-Leahy Scale: Poor Standing balance comment: requires B UE support                              Pertinent Vitals/Pain Pain Assessment: 0-10 Pain Score: 5  Pain Location: L flank from coughing Pain Descriptors / Indicators: Sore;Throbbing Pain Intervention(s): Limited activity within patient's tolerance;Monitored during session;Repositioned    Home Living Family/patient expects to be discharged to:: Private residence Living Arrangements: Spouse/significant other Available Help at Discharge: Family;Available PRN/intermittently Type of Home: Apartment Home Access: Level entry     Home Layout: One level Home Equipment: Grab bars - toilet;Grab bars - tub/shower;Walker - 2 wheels      Prior Function Level of Independence: Independent                  Extremity/Trunk Assessment   Upper Extremity Assessment Upper Extremity Assessment: Generalized weakness    Lower Extremity Assessment Lower Extremity Assessment: Generalized weakness       Communication   Communication: HOH  Cognition Arousal/Alertness: Awake/alert Behavior During Therapy: WFL for tasks assessed/performed Overall Cognitive Status: Within Functional Limits for tasks assessed  General Comments General comments (skin integrity, edema, etc.): Pt on 3.5 L O2 via nasal cannula on entry SaO2 95%O2, with ambulation SaO2 dropped to 77%O2 with poor wave form, however pt  experiencing 3/4 DoE, pt instructed in pursed lipped breathing with particular attention to breathing in through his nose to receive supllementary O2, with 30 sec rest SaO2 rebounded to 93%O2        Assessment/Plan    PT Assessment Patient needs continued PT services  PT Problem List Decreased strength;Decreased balance;Decreased mobility;Cardiopulmonary status limiting activity;Pain;Decreased safety awareness       PT Treatment Interventions DME instruction;Gait training;Functional mobility training;Therapeutic activities;Therapeutic exercise;Balance training;Cognitive remediation;Patient/family education    PT Goals (Current goals can be found in the Care Plan section)  Acute Rehab PT Goals Patient Stated Goal: stop coughing PT Goal Formulation: With patient Time For Goal Achievement: 12/06/17 Potential to Achieve Goals: Fair    Frequency Min 3X/week    AM-PAC PT "6 Clicks" Daily Activity  Outcome Measure Difficulty turning over in bed (including adjusting bedclothes, sheets and blankets)?: A Lot Difficulty moving from lying on back to sitting on the side of the bed? : A Lot Difficulty sitting down on and standing up from a chair with arms (e.g., wheelchair, bedside commode, etc,.)?: Unable Help needed moving to and from a bed to chair (including a wheelchair)?: A Little Help needed walking in hospital room?: A Little Help needed climbing 3-5 steps with a railing? : A Lot 6 Click Score: 13    End of Session Equipment Utilized During Treatment: Gait belt;Oxygen Activity Tolerance: Patient tolerated treatment well Patient left: in chair;with call bell/phone within reach;with chair alarm set;with nursing/sitter in room Nurse Communication: Mobility status;Patient requests pain meds PT Visit Diagnosis: Unsteadiness on feet (R26.81);Other abnormalities of gait and mobility (R26.89);Muscle weakness (generalized) (M62.81);Difficulty in walking, not elsewhere classified  (R26.2);Repeated falls (R29.6);History of falling (Z91.81);Pain Pain - part of body: (L flank with coughing)    Time: 1525-1550 PT Time Calculation (min) (ACUTE ONLY): 25 min   Charges:   PT Evaluation $PT Eval Moderate Complexity: 1 Mod PT Treatments $Gait Training: 8-22 mins   PT G Codes:        Oscar Baker PT, DPT Acute Rehabilitation  [phone] Pager [phone]    [place] 11/22/2017, 4:28 PM

## 2017-11-23 ENCOUNTER — Inpatient Hospital Stay (HOSPITAL_COMMUNITY): Payer: Medicare Other

## 2017-11-23 DIAGNOSIS — C9110 Chronic lymphocytic leukemia of B-cell type not having achieved remission: Secondary | ICD-10-CM

## 2017-11-23 DIAGNOSIS — J90 Pleural effusion, not elsewhere classified: Secondary | ICD-10-CM

## 2017-11-23 DIAGNOSIS — R05 Cough: Secondary | ICD-10-CM

## 2017-11-23 DIAGNOSIS — R053 Chronic cough: Secondary | ICD-10-CM

## 2017-11-23 DIAGNOSIS — J189 Pneumonia, unspecified organism: Secondary | ICD-10-CM

## 2017-11-23 DIAGNOSIS — R918 Other nonspecific abnormal finding of lung field: Secondary | ICD-10-CM

## 2017-11-23 DIAGNOSIS — R1310 Dysphagia, unspecified: Secondary | ICD-10-CM

## 2017-11-23 LAB — GLUCOSE, CAPILLARY
GLUCOSE-CAPILLARY: 179 mg/dL — AB (ref 65–99)
Glucose-Capillary: 315 mg/dL — ABNORMAL HIGH (ref 65–99)
Glucose-Capillary: 338 mg/dL — ABNORMAL HIGH (ref 65–99)
Glucose-Capillary: 342 mg/dL — ABNORMAL HIGH (ref 65–99)

## 2017-11-23 LAB — CULTURE, RESPIRATORY W GRAM STAIN: Culture: NORMAL

## 2017-11-23 LAB — BASIC METABOLIC PANEL
ANION GAP: 9 (ref 5–15)
BUN: 19 mg/dL (ref 6–20)
CALCIUM: 8.7 mg/dL — AB (ref 8.9–10.3)
CHLORIDE: 98 mmol/L — AB (ref 101–111)
CO2: 24 mmol/L (ref 22–32)
Creatinine, Ser: 0.94 mg/dL (ref 0.61–1.24)
GFR calc non Af Amer: >60 mL/min (ref >60)
GLUCOSE: 226 mg/dL — AB (ref 65–99)
POTASSIUM: 4.6 mmol/L (ref 3.5–5.1)
Sodium: 131 mmol/L — ABNORMAL LOW (ref 135–145)

## 2017-11-23 LAB — CBC
HEMATOCRIT: 33.1 % — AB (ref 39.0–52.0)
HEMOGLOBIN: 10.8 g/dL — AB (ref 13.0–17.0)
MCH: 32 pg (ref 26.0–34.0)
MCHC: 32.6 g/dL (ref 30.0–36.0)
MCV: 98.2 fL (ref 78.0–100.0)
Platelets: 455 10*3/uL — ABNORMAL HIGH (ref 150–400)
RBC: 3.37 MIL/uL — AB (ref 4.22–5.81)
RDW: 18.1 % — ABNORMAL HIGH (ref 11.5–15.5)
WBC: 60 10*3/uL (ref 4.0–10.5)

## 2017-11-23 LAB — CULTURE, RESPIRATORY: SPECIAL REQUESTS: NORMAL

## 2017-11-23 LAB — PROTIME-INR
INR: 3.64
PROTHROMBIN TIME: 35.9 s — AB (ref 11.4–15.2)

## 2017-11-23 MED ORDER — INSULIN ASPART 100 UNIT/ML ~~LOC~~ SOLN
4.0000 [IU] | Freq: Three times a day (TID) | SUBCUTANEOUS | Status: DC
  Administered 2017-11-23 – 2017-11-28 (×15): 4 [IU] via SUBCUTANEOUS

## 2017-11-23 MED ORDER — RESOURCE THICKENUP CLEAR PO POWD
ORAL | Status: DC | PRN
  Filled 2017-11-23: qty 125

## 2017-11-23 MED ORDER — HYDROCOD POLST-CPM POLST ER 10-8 MG/5ML PO SUER
5.0000 mL | Freq: Two times a day (BID) | ORAL | Status: DC | PRN
  Administered 2017-11-24 – 2017-11-27 (×3): 5 mL via ORAL
  Filled 2017-11-23 (×3): qty 5

## 2017-11-23 MED ORDER — METHYLPREDNISOLONE SODIUM SUCC 40 MG IJ SOLR
40.0000 mg | Freq: Two times a day (BID) | INTRAMUSCULAR | Status: DC
  Administered 2017-11-23 – 2017-11-24 (×3): 40 mg via INTRAVENOUS
  Filled 2017-11-23 (×3): qty 1

## 2017-11-23 MED ORDER — FUROSEMIDE 10 MG/ML IJ SOLN
40.0000 mg | Freq: Two times a day (BID) | INTRAMUSCULAR | Status: AC
  Administered 2017-11-23 – 2017-11-26 (×6): 40 mg via INTRAVENOUS
  Filled 2017-11-23 (×6): qty 4

## 2017-11-23 MED ORDER — WARFARIN - PHARMACIST DOSING INPATIENT
Freq: Every day | Status: DC
  Administered 2017-11-26: 17:00:00

## 2017-11-23 MED ORDER — INSULIN GLARGINE 100 UNIT/ML ~~LOC~~ SOLN
15.0000 [IU] | Freq: Every day | SUBCUTANEOUS | Status: DC
  Administered 2017-11-23: 15 [IU] via SUBCUTANEOUS
  Filled 2017-11-23: qty 0.15

## 2017-11-23 NOTE — Progress Notes (Signed)
[place] for Warfarin - hold Indication: atrial fibrillation  Allergies  Allergen Reactions  . Altace [Ramipril] Other (See Comments)    "throat felt like had a knot in it"  . Codeine Nausea And Vomiting    Nausea and vomiting   . Simvastatin Other (See Comments)    Leg aches    Patient Measurements: Height: 5\' 10"  (177.8 cm) Weight: 138 lb 12.8 oz (63 kg) IBW/kg (Calculated) : 73  Vital Signs: Temp: 97.4 F (36.3 C) (06/04 1239) Temp Source: Axillary (06/04 1239) BP: 117/96 (06/04 0752) Pulse Rate: 60 (06/04 1239)  Labs: Recent Labs    11/20/17 1831  11/21/17 0226 11/22/17 0241 11/23/17 0218  HGB  --    < > 10.1* 10.1* 10.8*  HCT  --   --  31.0* 30.3* 33.1*  PLT  --   --  369 381 455*  LABPROT  --   --  23.4* 31.3* 35.9*  INR  --   --  2.10 3.05 3.64  CREATININE  --   --  0.97 0.82 0.94  TROPONINI <0.03  --   --   --   --    < > = values in this interval not displayed.    Estimated Creatinine Clearance: 53.1 mL/min (by C-G formula based on SCr of 0.94 mg/dL).  Assessment: 84 y.o. male admitted on 11/20/2017 with pneumonia. on warfarin PTA for AFib- home dose is 5mg  daily except 7.5mg  on Saturdays, admit INR 2.01  INR today remains SUPRAtherapeutic despite a low dose given yesterday evening (INR 3.64 << 3.05, goal of 2-3). The jump up in INR may be related to the patient's sepsis, recent antibiotics, or oral intake. CBC stable - no bleeding noted. Will hold warfarin dose this evening.   Goal of Therapy:  INR 2-3 Monitor platelets by anticoagulation protocol: Yes   Plan:  -Asked by Dr. Nelda Marseille to hold warfarin for thoracentesis.  Already planning to hold tonight due to supratherapeutic INR.  Will f/u AM INR.  Marguerite Olea, [facility] Clinical Pharmacist Pager [phone]  11/23/2017 3:34 PM

## 2017-11-23 NOTE — Consult Note (Addendum)
Name: Oscar Baker MRN: [id number] DOB: [birth year]    ADMISSION DATE:  11/20/2017 CONSULTATION DATE: 11/23/2017  REFERRING MD : [facility]  CHIEF COMPLAINT: Chronic cough, green sputum production for over a year  BRIEF PATIENT DESCRIPTION: Elderly male no acute distress at rest  SIGNIFICANT EVENTS    STUDIES:     HISTORY OF PRESENT ILLNESS:  Oscar Baker is an 84 year old male with extensive past medical history that includes but is not limited to prostate cancer 2004 with a radical prostatectomy 36 radiation treatments, elevated PSA at this last year up to 3 treated with hormone therapy and is now back at 0 Plan he is followed by Osc[facility]ad and by Oscar Baker.  Recent diagnosis of CLL with no treatment this time is being monitored by oncology.  He has a history of paroxysmal atrial fibrillation as a permanent pacemaker in place and is on chronic Coumadin therapy with his latest INR 3.64.  He also has known diabetes mellitus, anemia, hypertension, and presumed chronic healthcare associated pneumonia.  After long discussions with him the findings been having a chronic cyclic cough for over a year.  Intermittently production of yellow to green sputum he is treated with antibiotics and mucolytic's and improves it occurs again within 1 or 2 weeks.  He was extensively worked up by Oscar Baker for cyclic cough.  Suggestions were made for him to follow-up with an allergist which he has not.  He further notes that during meals he sometimes chokes especially it in the mail order is with dry material such as a saltine cracker.  He has not had a swallowing evaluation is evident on his chart that he remembers.  Pulmonary is called for consultation today 11/23/2017 ostensibly for a left pleural effusion again noted his INR is 3.64.  Question whether this could be parapneumonic from chronic infections or from heart failure.  His last 2D echo in 06/2017 demonstrates an EF of 45 to 50%.  He also has left ventricular  hypertrophy.  Along with hypokinesis of the basilar mid inferior myocardium.  He also demonstrates atrial fibrillation.  He does continue to have intermittent green sputum production despite being on antibiotics.  His cough is very hoarse and explosive.  His wife notes that his coughing does escalate during times of eating.  She also she further notes he is been coughing like this for over a year.  Suspect he needs a swallow evaluation to rule out aspiration.  Pleural effusion can be monitored and/or we can consider doing a thoracentesis once his INR has been corrected.  This will be diagnostic and therapeutic may make his breathing better from size amount of fluid is removed we can also send it for test to make sure this is not a recurring infection of the parapneumonic effusion.   PAST MEDICAL HISTORY :   has a past medical history of Asthma (1950's), Atrial fibrillation (HCC), Bilateral carpal tunnel syndrome, Bilateral lower extremity edema, Bladder tumor, Chronic systolic heart failure (HCC), CLL (chronic lymphocytic leukemia) ([place]) (oncologist-  dr Oscar Baker--), Coronary artery disease, Deafness in right ear, Diabetes mellitus type 2, noninsulin dependent ([place]), Elevated PSA, Hematuria (04/2017), History of ear infection, History of MI (myocardial infarction), History of shingles (08/2017), Hyperlipidemia, Hypertension, Ischemic cardiomyopathy (09/01/2017), OA (osteoarthritis), Pacemaker (02/08/2014), Permanent atrial fibrillation ([place]), Pneumonia (2019), Prostate cancer [facility]) (urologist-  dr Diona Baker), RBBB (right bundle branch block), Sick sinus syndrome ([place]), Urinary incontinence, and Wears hearing aid in right ear.  has a past surgical history that  includes Back surgery; Appendectomy; permanent pacemaker insertion (N/A, 02/08/2014); Total hip arthroplasty (Left, 05/12/2016); Cardiovascular stress test (08-18-2017  dr Oscar Baker); transthoracic echocardiogram (07/20/2017); Cardiac catheterization  (09-03-1999  dr Oscar Baker); Tonsillectomy; RADICAL RETROPUBIC PROSTATECTOMY W/ BILATERAL PELVIC LYMPH NODE DISSECTION (11-28-2002   dr Oscar Baker  [facility]); INSERTION PENILE PROSTHESIS (02-22-2004    dr Oscar Baker  [facility]); Total hip arthroplasty (Right, 07-15-2006   dr Oscar Baker  [facility]); Carpal tunnel release (Right, 2000); Carpal tunnel release (Left, 11/19/2009); Cataract extraction (Right, 07/2015); Cataract extraction (Left, 09/2015); Knee arthroscopy (Left, 07/2010); and Cystoscopy (N/A, 11/04/2017). Prior to Admission medications   Medication Sig Start Date End Date Taking? Authorizing Provider  atorvastatin (LIPITOR) 20 MG tablet TAKE 1 TABLET BY MOUTH DAILY AT 6PM 06/01/17  Yes Nahser, Wonda Cheng, MD  carvedilol (COREG) 3.125 MG tablet Take 1 tablet (3.125 mg total) by mouth 2 (two) times daily. 08/20/17 11/20/17 Yes Weaver, Scott T, PA-C  fenofibrate micronized (LOFIBRA) 200 MG capsule TAKE 1 CAPSULE BY MOUTH DAILY BEFORE BREAKFAST 06/29/17  Yes Nahser, Wonda Cheng, MD  furosemide (LASIX) 20 MG tablet Take 1 tablet (20 mg total) by mouth daily as needed. 09/01/17 11/30/17 Yes Weaver, Scott T, PA-C  glipiZIDE (GLUCOTROL XL) 2.5 MG 24 hr tablet Take 2.5 mg by mouth as needed (FOR DIABETES BS >140).    Yes [provider]  JANUVIA 100 MG tablet Take 100 mg by mouth at bedtime.  02/27/17  Yes [provider]  metFORMIN (GLUCOPHAGE) 1000 MG tablet Take 1,000 mg by mouth 2 (two) times daily with a meal.     Yes [provider]  multivitamin (THERAGRAN) per tablet Take 1 tablet by mouth daily. Will stop prior to procedure   Yes [provider]  vitamin B-12 (CYANOCOBALAMIN) 1000 MCG tablet Take 1,000 mcg by mouth daily. Will stop prior to procedure   Yes [provider]  warfarin (COUMADIN) 5 MG tablet TAKE AS DIRECTED BY COUMADIN CLINIC Patient taking differently: Take 5-7.5 mg by mouth See admin instructions. Take 7.5mg  on Saturdays and 5mg  on all other days or TAKE AS DIRECTED BY  COUMADIN CLINIC 06/02/17  Yes Nahser, Wonda Cheng, MD   Allergies  Allergen Reactions  . Altace [Ramipril] Other (See Comments)    "throat felt like had a knot in it"  . Codeine Nausea And Vomiting    Nausea and vomiting   . Simvastatin Other (See Comments)    Leg aches    FAMILY HISTORY:  family history includes Allergic rhinitis in his brother and mother; Emphysema in his mother; Heart attack in his father; Pulmonary fibrosis in his brother. SOCIAL HISTORY:  reports that he quit smoking about 42 years ago. His smoking use included pipe and cigars. He quit after 25.00 years of use. He has never used smokeless tobacco. He reports that he drinks alcohol. He reports that he does not use drugs.  REVIEW OF SYSTEMS:   10 point review of system taken, please see HPI for positives and negatives.   SUBJECTIVE:  84 year old male in no acute distress does have a chronic hoarse cough. VITAL SIGNS: Temp:  [97.6 F (36.4 C)-98.5 F (36.9 C)] 97.6 F (36.4 C) (06/04 0752) Pulse Rate:  [62-72] 62 (06/04 0752) Resp:  [21-27] 27 (06/04 0752) BP: (108-133)/(66-96) 117/96 (06/04 0752) SpO2:  [92 %-99 %] 94 % (06/04 0752)  PHYSICAL EXAMINATION: General: Well-nourished well-developed male Neuro: Intact follows commands moves all extremities he is hard of hearing HEENT: No JVD lymphadenopathy is appreciated.,  Oropharynx is unremarkable Cardiovascular: Heart sounds are regular regular Lungs: Coarse rhonchi bilaterally diminished in the left base dull percussion left base Abdomen: Soft nontender Musculoskeletal: Intact Skin: Warm and dry  Recent Labs  Lab 11/21/17 0226 11/22/17 0241 11/23/17 0218  NA 137 130* 131*  K 4.0 4.4 4.6  CL 105 99* 98*  CO2 21* 22 24  BUN 16 17 19   CREATININE 0.97 0.82 0.94  GLUCOSE 233* 190* 226*   Recent Labs  Lab 11/21/17 0226 11/22/17 0241 11/23/17 0218  HGB 10.1* 10.1* 10.8*  HCT 31.0* 30.3* 33.1*  WBC 40.6* 47.3* 60.0*  PLT 369 381 455*   Dg  Chest 1 View  Result Date: 11/23/2017 CLINICAL DATA:  Hypoxia.  Atrial fibrillation. EXAM: CHEST  1 VIEW COMPARISON:  November 22, 2017 FINDINGS: There is opacity throughout much of the left lung, in part due to pleural effusion. There is also patchy airspace consolidation throughout much of the left lung. There is slight atelectatic change in the right base. The right lung is otherwise clear. Heart is mildly enlarged with pulmonary vascularity within normal limits. Pacemaker lead is attached to the right ventricle. No adenopathy. There is aortic atherosclerosis. There is degenerative change in each shoulder. IMPRESSION: Opacity throughout much of the left lung likely due to a combination of pleural effusion and airspace consolidation, essentially stable. Slight right base atelectasis. Right lung otherwise clear. Stable cardiomegaly. Stable pacemaker placement. There is aortic atherosclerosis. Aortic Atherosclerosis (ICD10-I70.0). Electronically Signed   By: Lowella Grip III M.D.   On: 11/23/2017 08:17   Dg Chest 1 View  Result Date: 11/22/2017 CLINICAL DATA:  Progressive cough, congestion, and shortness of breath. Fall. EXAM: CHEST  1 VIEW COMPARISON:  Two-view chest x-ray 11/20/2017 FINDINGS: The heart size is exaggerated by low lung volumes. Increased asymmetric left-sided interstitial and airspace disease is noted. A left pleural effusion is present. Right-sided interstitial disease is increasing. IMPRESSION: 1. Increasing interstitial and airspace disease in the left lung consistent with edema and infection. 2. Increasing left pleural effusion. 3. Progressive edema in the right lung. Electronically Signed   By: San Morelle M.D.   On: 11/22/2017 07:34    ASSESSMENT: Principal Problem:   HCAP (healthcare-associated pneumonia) Active Problems:   HTN (hypertension)   Hyperlipidemia   Diabetes mellitus type 2, noninsulin dependent (HCC)   LPRD (laryngopharyngeal reflux disease)   Other  allergic rhinitis   Anemia   Chronic cough   CLL (chronic lymphocytic leukemia) (HCC)   Pleural effusion   Discussion:  Mr. Mcneish is an 84 year old male with extensive past medical history that includes but is not limited to prostate cancer 2004 with a radical prostatectomy 36 radiation treatments, elevated PSA at this last year up to 3 treated with hormone therapy and is now back at 0 Plan he is followed by Osc[facility]ad and by Oscar Baker.  Recent diagnosis of CLL with no treatment this time is being monitored by oncology.  He has a history of paroxysmal atrial fibrillation as a permanent pacemaker in place and is on chronic Coumadin therapy with his latest INR 3.64.  He also has known diabetes mellitus, anemia, hypertension, and presumed chronic healthcare associated pneumonia.  After long discussions with him the findings been having a chronic cyclic cough for over a year.  Intermittently production of yellow to green sputum he is treated with antibiotics and mucolytic's and improves it occurs again within 1 or 2 weeks.  He was extensively worked up by Oscar Baker for  cyclic cough.  Suggestions were made for him to follow-up with an allergist which he has not.  He further notes that during meals he sometimes chokes especially it in the mail order is with dry material such as a saltine cracker.  He has not had a swallowing evaluation is evident on his chart that he remembers.  Pulmonary is called for consultation today 11/23/2017 ostensibly for a left pleural effusion again noted his INR is 3.64.  Question whether this could be parapneumonic from chronic infections or from heart failure.  His last 2D echo in 06/2017 demonstrates an EF of 45 to 50%.  He also has left ventricular hypertrophy.  Along with hypokinesis of the basilar mid inferior myocardium.  He also demonstrates atrial fibrillation.  He does continue to have intermittent green sputum production despite being on antibiotics.  His cough is  very hoarse and explosive.  His wife notes that his coughing does escalate during times of eating.  She also she further notes he is been coughing like this for over a year.  Suspect he needs a swallow evaluation to rule out aspiration.  Pleural effusion can be monitored and/or we can consider doing a thoracentesis once his INR has been corrected.  This will be diagnostic and therapeutic may make his breathing better from size amount of fluid is removed we can also send it for test to make sure this is not a recurring infection of the parapneumonic effusion.    PLAN: Swallow evaluation  Thoracentesis done his INR will need to be corrected in 2 or less prior to any invasive procedure  Cough suppressant  Mucolytic  Oxygen therapy as needed  All other issues per primary care    [facility] Minor ACNP Maryanna Shape PCCM Pager [phone] till 1 pm If no answer page [phone] 11/23/2017, 12:30 PM  Attending Note:  84 year old male with what seems to be chronic cough at this point that appears to be more related to chronic aspiration.  Patient comes back with cough productive of green sputum that has been occurring for well over a year.  I reviewed serial CXR, LLL infiltrate is chronic but pleural effusion is now of a larger magnitude than ever seen before.  On exam, patient with decreased BS on the left compared to right but coarse BS diffusely.  Discussed with PCCM-NP.  Pulmonary infiltrate: ?chronic aspiration  - CT of the chest without contrast   Dysphagia:  - Swallow evaluation  Pleural effusion:  - CT of the chest as above  - Will likely need to sample fluid but would like to see INR in the 1.5-2.0 range, recommend holding coumadin and INR in AM  Hypoxemia:  - Titrate O2 for sat of 88-92%  Cough: likely from aspiration  - Swallow evaluation  - Speech pathology  PCCM will continue to follow  Patient seen and examined, agree with above note.  I dictated the care and orders written  for this patient under my direction.  Rush Farmer, M.D. [facility] Pulmonary/Critical Care Medicine. Pager: [phone]. After hours pager: [phone].

## 2017-11-23 NOTE — Evaluation (Signed)
Clinical/Bedside Swallow Evaluation Patient Details  Name: Oscar Baker MRN: [id number] Date of Birth: 03/10/1933  Today's Date: 11/23/2017 Time: SLP Start Time (ACUTE ONLY): 71 SLP Stop Time (ACUTE ONLY): 1514 SLP Time Calculation (min) (ACUTE ONLY): 11 min  Past Medical History:  Past Medical History:  Diagnosis Date  . Asthma 1950's   history of  . Atrial fibrillation ([place])   . Bilateral carpal tunnel syndrome   . Bilateral lower extremity edema   . Bladder tumor   . Chronic systolic heart failure (HCC)    Echo 1/19: Mild LVH, EF 45-50, inf HK, MAC, severe LAE, severe RAE // Echo 7/15: Mild LVH, mod focal basal sept hypertrophy, EF 55-60, AV peak and mean 16/9, trivial MR, mod LAE, PASP 38  . CLL (chronic lymphocytic leukemia) [facility]) oncologist-  dr Ilene Qua--   dx [phone] ;  Lymphocytosis, CLL - per lov note 05-11-2017 currently under active survillance,  CT 04-17-2014 show very small lymphadenopathy, no indication for treatment  . Coronary artery disease    cardiologist-  dr Cathie Olden--  08-18-2017 Intermittant risk nuclear study w/ large area of inferior infartion with no evidence ishcemia   . 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 Diabetes mellitus type 2, noninsulin dependent ([place])   . Elevated PSA    since prostatectomy but now resolved  . Hematuria 04/2017  . History of ear infection    Right  . History of MI (myocardial infarction)    per myoview nuclear study 08-18-2017 , unknown when  . History of shingles 08/2017   L ear and scalp, possible  . Hyperlipidemia   . Hypertension   . Ischemic cardiomyopathy 09/01/2017   Presumed +CAD with Nuclear stress test 08/18/17 - Inferior scar, no ischemia, intermediate risk // med management unless +angina or worse dyspnea  . OA (osteoarthritis)   . Pacemaker 02/08/2014   followed by dr g. taylor--  single chamber Biotronik due to SSS  . Permanent atrial fibrillation ([place])   . Pneumonia 2019   Left lung  . Prostate cancer [name]) urologist-  dr  Diona Fanti   dx 2004--  Gleason 8, PSA 10.45--  11-28-2002  s/p  radical prostatectomy;  recurrent w/ increasing PSA, started ADT treatment  . RBBB (right bundle branch block)   . Sick sinus syndrome ([place])    a-Flutter with episodes of bradycardia; S/P Biotronik (serial number [id number]) 02-08-2014  . Urinary incontinence   . Wears hearing aid in right ear    receiver and transmitter   Past Surgical History:  Past Surgical History:  Procedure Laterality Date  . APPENDECTOMY    . BACK SURGERY     disk  . CARDIAC CATHETERIZATION  09-03-1999  dr Cathie Olden   abnormal cardiolite study:  minor luminal irregularities but no critial coronary artery stenosis  . CARDIOVASCULAR STRESS TEST  08-18-2017  dr Cathie Olden   Intermediate risk nuclear study w/ large area inferior infarction, no evidence of ishcemia (consistant w/ prior MI)/  study not gated due to frequent PVCs  . CARPAL TUNNEL RELEASE Right 2000  . CARPAL TUNNEL RELEASE Left 11/19/2009  . CATARACT EXTRACTION Right 07/2015  . CATARACT EXTRACTION Left 09/2015  . CYSTOSCOPY N/A 11/04/2017   Procedure: CYSTOSCOPY AND CAUTERIZATION OF BLADDER;  Surgeon: Franchot Gallo, MD;  Location: [facility];  Service: Urology;  Laterality: N/A;  . INSERTION PENILE PROSTHESIS  02-22-2004    dr Mattie Marlin  [facility]  . KNEE ARTHROSCOPY Left 07/2010  . PERMANENT PACEMAKER INSERTION N/A  02/08/2014   Procedure: PERMANENT PACEMAKER INSERTION;  Surgeon: Evans Lance, MD;  Location[facility] CATH LAB;  Service: Cardiovascular;  Laterality: N/A;  . RADICAL RETROPUBIC PROSTATECTOMY W/ BILATERAL PELVIC LYMPH NODE DISSECTION  11-28-2002   dr Mattie Marlin  [facility]  . TONSILLECTOMY    . TOTAL HIP ARTHROPLASTY Left 05/12/2016   Procedure: LEFT TOTAL HIP ARTHROPLASTY ANTERIOR APPROACH;  Surgeon: Paralee Cancel, MD;  Location: [facility] ORS;  Service: Orthopedics;  Laterality: Left;  . TOTAL HIP ARTHROPLASTY Right 07-15-2006   dr Alvan Dame  [facility]  . TRANSTHORACIC ECHOCARDIOGRAM  07/20/2017    mild LVH, ef 45-50%, hypokinesis of the basal-midinferior myocardium, due to AFib unable to evaluate diastolic function/  severe LAE and RAE/  trivial PR and TR   HPI:  Pt is an 84 year-old male with a history of CLL, prostate cancer, sick sinus syndrome, pacemaker, paroxysmal atrial fibrillation, CHF with ejection fraction 45% admitted with LLL PNA. Per consult note from critical care, pt/wife describe cyclic cough for over a year, with coughing sometimes escalating during meals, especially with dry foods.   Assessment / Plan / Recommendation Clinical Impression  Pt does not have coughing with thin liquids and his vocal quality remains clear; however, shortly after drinking, his RR climbed from around 15 at baseline into the 30s. Dr. Nelda Marseille reports similar findings and voices his concern for aspiration. Pt says that he often becomes SOB with intake. Although he denies other swallowing difficulty, he also says he feels like the liquids get "hung up" in the back of his mouth. His wife and friend also describe anterior spillage of thin liquids and at times excessive saliva production. Discussed pursuing MBS to better assess oropharyngeal function and risk for airway compromise given concern for silent aspiration. He is in agreement to proceed - will complete this afternoon. SLP Visit Diagnosis: Dysphagia, unspecified (R13.10)    Aspiration Risk  Mild aspiration risk    Diet Recommendation Other (Comment)(defer recommendations pending MBS)   Medication Administration: Whole meds with puree    Other  Recommendations Oral Care Recommendations: Oral care QID   Follow up Recommendations (tba)      Frequency and Duration            Prognosis        Swallow Study   General HPI: Pt is an 84 year-old male with a history of CLL, prostate cancer, sick sinus syndrome, pacemaker, paroxysmal atrial fibrillation, CHF with ejection fraction 45% admitted with LLL PNA. Per consult note from critical  care, pt/wife describe cyclic cough for over a year, with coughing sometimes escalating during meals, especially with dry foods. Type of Study: Bedside Swallow Evaluation Previous Swallow Assessment: none in chart Diet Prior to this Study: Regular;Thin liquids Temperature Spikes Noted: No Respiratory Status: Nasal cannula History of Recent Intubation: No Behavior/Cognition: Alert;Cooperative;Pleasant mood;Other (Comment)(HOH) Oral Cavity Assessment: Within Functional Limits Oral Care Completed by SLP: No Oral Cavity - Dentition: Adequate natural dentition Vision: Functional for self-feeding Self-Feeding Abilities: Able to feed self Patient Positioning: Upright in bed Baseline Vocal Quality: Normal Volitional Swallow: Able to elicit    Oral/Motor/Sensory Function Overall Oral Motor/Sensory Function: Within functional limits   Ice Chips Ice chips: Not tested   Thin Liquid Thin Liquid: Impaired Presentation: Cup;Self Fed Pharyngeal  Phase Impairments: Change in Vital Signs(RR changed from 15 at baseline into the 30s)    Nectar Thick Nectar Thick Liquid: Not tested   Honey Thick Honey Thick Liquid: Not tested   Puree Puree:  Not tested   Solid   GO   Solid: Not tested        [name] 11/23/2017,3:36 PM  [name], M.A. CCC-SLP [phone]

## 2017-11-23 NOTE — Progress Notes (Signed)
Modified Barium Swallow Progress Note  Patient Details  Name: Oscar Baker MRN: [id number] Date of Birth: [birth year]  Today's Date: 11/23/2017  Modified Barium Swallow completed.  Full report located under Chart Review in the Imaging Section.  Brief recommendations include the following:  Clinical Impression  Pt has a mild oropharyngeal dysphagia with structural component due to suspected osteophytes most prominent at C3-4, C4-5 that impede full epiglottic deflection and airway closure. What further impacts his ability to protect his airway is his impulsive intake (large boluses and fast rate) and premature spillage. Thin and nectar thick liquids spill into the pharynx, filling the valleculae and spilling onward to the pyriform sinuses before the swallow. Then, as he swallows, the thin liquids spills into the partially open airway, allowing for trace amounts of penetration and aspiration. Pt had significant baseline coughing that made it very challenging to determine whether coughing was related to aspiration or not.   SLP provided Mod cues for attempts at a chin tuck, oral holding, and smaller bolus sizes. Small sips were the most effective at protecting the airway with thin liquids although I suspect if he could have performed the oral hold that this would further increase his safety with swallowing. Unfortunately, pt could not consistently utilize these strategies. No airway compromise occurred with thicker liquids or solids, although pt did have to take intermittent pauses during oral prep with solids to catch his breath.   Although pt's dysphagia is mild, considering his impulsivity as well as his recurrent PNA and chronic cough, would favor starting a more conservative diet at this time: Dys 3 diet and nectar thick liquids. SLP will f/u to facilitate hopeful transition back to thin liquids with improved ability to take small sips, contain liquids orally, and increase oral care/use of aspiration  precautions.   Swallow Evaluation Recommendations       SLP Diet Recommendations: Dysphagia 3 (Mech soft) solids;Nectar thick liquid   Liquid Administration via: Cup   Medication Administration: Whole meds with puree   Supervision: Patient able to self feed;Intermittent supervision to cue for compensatory strategies   Compensations: Slow rate;Small sips/bites   Postural Changes: Seated upright at 90 degrees   Oral Care Recommendations: Oral care BID   Other Recommendations: Order thickener from pharmacy;Prohibited food (jello, ice cream, thin soups);Remove water pitcher    [name] 11/23/2017,5:03 PM   [name], M.A. CCC-SLP [phone]

## 2017-11-23 NOTE — Care Management Note (Addendum)
Case Management Note  Patient Details  Name: Oscar Baker MRN: [id number] Date of Birth: September 21, 1932  Subjective/Objective:    From home, presents with HCAP, pl effusion, afib, hx of CLL, per pt eval rec to resume his outpatient physical therapy, NCM contacted MD to make sure this would be ok for patient to resume his outpatient physical therapy at discharge. Per MD ,yes patient can resume at dc.  NCM sent referral thru epic to resume outpatient pt/ot when he is discharged. Awaiting inr to be less than 2 for thorocentesis.               Action/Plan: DC home when ready.   Expected Discharge Date:  11/24/17               Expected Discharge Plan:  Home/Self Care  In-House Referral:     Discharge planning Services  CM Consult  Post Acute Care Choice:    Choice offered to:     DME Arranged:    DME Agency:     HH Arranged:    HH Agency:     Status of Service:  Completed, signed off  If discussed at [employer] of Stay Meetings, dates discussed:    Additional Comments:  Zenon Mayo, RN 11/23/2017, 9:37 AM

## 2017-11-23 NOTE — Progress Notes (Signed)
[place] for Warfarin Indication: atrial fibrillation  Allergies  Allergen Reactions  . Altace [Ramipril] Other (See Comments)    "throat felt like had a knot in it"  . Codeine Nausea And Vomiting    Nausea and vomiting   . Simvastatin Other (See Comments)    Leg aches    Patient Measurements: Height: 5\' 10"  (177.8 cm) Weight: 138 lb 12.8 oz (63 kg) IBW/kg (Calculated) : 73  Vital Signs: Temp: 97.6 F (36.4 C) (06/04 0752) Temp Source: Oral (06/04 0752) BP: 117/96 (06/04 0752) Pulse Rate: 62 (06/04 0752)  Labs: Recent Labs    11/20/17 1831 11/21/17 0226 11/22/17 0241 11/23/17 0218  HGB  --  10.1* 10.1* 10.8*  HCT  --  31.0* 30.3* 33.1*  PLT  --  369 381 455*  LABPROT  --  23.4* 31.3* 35.9*  INR  --  2.10 3.05 3.64  CREATININE  --  0.97 0.82 0.94  TROPONINI <0.03  --   --   --     Estimated Creatinine Clearance: 53.1 mL/min (by C-G formula based on SCr of 0.94 mg/dL).  Assessment: 84 y.o. male admitted on 11/20/2017 with pneumonia. on warfarin PTA for AFib- home dose is 5mg  daily except 7.5mg  on Saturdays, admit INR 2.01  INR today remains SUPRAtherapeutic despite a low dose given yesterday evening (INR 3.64 << 3.05, goal of 2-3). The jump up in INR may be related to the patient's sepsis, recent antibiotics, or oral intake. CBC stable - no bleeding noted. Will hold warfarin dose this evening.   Goal of Therapy:  INR 2-3 Monitor platelets by anticoagulation protocol: Yes   Plan:  - Hold Warfarin - Will continue to monitor for any signs/symptoms of bleeding and will follow up with PT/INR in the a.m.   Thank you for allowing pharmacy to be a part of this patient's care.  Alycia Rossetti, PharmD, BCPS Clinical Pharmacist Pager: [phone] Clinical phone for 11/23/2017 from 7a-3:30p: [phone] If after 3:30p, please call main pharmacy at: [phone] 11/23/2017 8:46 AM

## 2017-11-23 NOTE — Progress Notes (Addendum)
PROGRESS NOTE    Oscar Baker  EXH:[id number] DOB: 22-Dec-1932 DOA: 11/20/2017 PCP: [name], MD  Brief Narrative:84year-old male with a history of CLL, prostate cancer, sick sinus syndrome, pacemaker, paroxysmal atrial fibrillation, CHF with ejection fraction 45% admitted with fever increasing shortness of breath cough with yellow to green phlegm for 1 week prior to admission. He was found to have left lower lobe pneumonia with associated effusion and is admitted for the treatment of the same.    Assessment & Plan:   Principal Problem:   HCAP (healthcare-associated pneumonia) Active Problems:   HTN (hypertension)   Hyperlipidemia   Diabetes mellitus type 2, noninsulin dependent ([place])   Anemia  1]HCAP/H influenza bacteremia-patient reports his feels breathing is worse today compared to yesterday.  Not able to bring up any phlegm.  Currently on ceftriaxone for H influenza bacteremia.  Blood culture came back positive for H. influenzae.  Increasing leukocytosis secondary to CLL.  We will start him on steroids .  Called in consult to pulmonary today.  2]paroxysmal atrial fibrillation with CHF continue carvedilol and Coumadin.  Increase the dose of Lasix to 40 mg twice a day for 3 days.  Then reevaluate and adjust the dose as needed.  Chest x-ray done yesterday and today noted with left sided infiltrates and opacity and pleural effusion.  Patient is not on potassium replacement acute remains normal at 4.6 follow-up tomorrow.  Echocardiogram with ejection fraction 45 to 50% January 2019.  3]history of hypertension continue carvedilol and Lasix.  4]type 2 diabetes  blood sugar has been elevated.   5]hyponatremia-?  Secondary to pneumonia follow-up levels.  Better today.     DVT prophylaxis: Coumadin Code Status: DO NOT RESUSCITATE Family Communication wife in the room discussed with wife Disposition Plan: He came from home and he had outpatient PT which will be continued  upon discharge.  However he is not ready to be discharged yet I would think he will be here at least through the weekend.   Consultants: Called pulmonary consult 11/23/2017  Procedures: None Antimicrobials: Ceftriaxone  Subjective: Patient complaining of increased difficulty breathing and pleuritic chest pain and not able to bring up phlegm in spite of Mucomyst.  Also complaining of wheezing increased.   Objective: Vitals:   11/23/17 0056 11/23/17 0415 11/23/17 0732 11/23/17 0752  BP: 121/66 108/67  (!) 117/96  Pulse: 63 63  62  Resp:  (!) 27  (!) 27  Temp: 97.6 F (36.4 C) 98 F (36.7 C)  97.6 F (36.4 C)  TempSrc: Oral Oral  Oral  SpO2: 94% 94% 99% 94%  Weight:      Height:        Intake/Output Summary (Last 24 hours) at 11/23/2017 1215 Last data filed at 11/23/2017 1152 Gross per 24 hour  Intake 100 ml  Output 2025 ml  Net -1925 ml   Filed Weights   11/20/17 2100 11/21/17 0537  Weight: 97.1 kg (214 lb 1.1 oz) 63 kg (138 lb 12.8 oz)    Examination:  General exam: Appears calm and comfortable  Respiratory system: Diffuse wheezing auscultation. Respiratory effort normal. Cardiovascular system: S1 & S2 heard, RRR. No JVD, murmurs, rubs, gallops or clicks. No pedal edema. Gastrointestinal system: Abdomen is nondistended, soft and nontender. No organomegaly or masses felt. Normal bowel sounds heard. Central nervous system: Alert and oriented. No focal neurological deficits. Extremities: Symmetric 5 x 5 power. Skin: No rashes, lesions or ulcers Psychiatry: Judgement and insight appear normal. Mood & affect  appropriate.     Data Reviewed: I have personally reviewed following labs and imaging studies  CBC: Recent Labs  Lab 11/20/17 1339 11/21/17 0226 11/22/17 0241 11/23/17 0218  WBC 35.5* 40.6* 47.3* 60.0*  NEUTROABS 12.8*  --   --   --   HGB 11.0* 10.1* 10.1* 10.8*  HCT 33.3* 31.0* 30.3* 33.1*  MCV 99.4 100.0 99.7 98.2  PLT 372 369 381 517*   Basic Metabolic  Panel: Recent Labs  Lab 11/20/17 1339 11/21/17 0226 11/22/17 0241 11/23/17 0218  NA 136 137 130* 131*  K 4.8 4.0 4.4 4.6  CL 105 105 99* 98*  CO2 22 21* 22 24  GLUCOSE 256* 233* 190* 226*  BUN 17 16 17 19   CREATININE 1.07 0.97 0.82 0.94  CALCIUM 8.8* 8.4* 8.2* 8.7*  MG  --   --  1.9  --    GFR: Estimated Creatinine Clearance: 53.1 mL/min (by C-G formula based on SCr of 0.94 mg/dL). Liver Function Tests: Recent Labs  Lab 11/20/17 1339 11/21/17 0226  AST 36 24  ALT 26 21  ALKPHOS 72 62  BILITOT 1.1 1.6*  PROT 6.8 5.7*  ALBUMIN 3.2* 2.6*   No results for input(s): LIPASE, AMYLASE in the last 168 hours. No results for input(s): AMMONIA in the last 168 hours. Coagulation Profile: Recent Labs  Lab 11/19/17 0816 11/20/17 1339 11/21/17 0226 11/22/17 0241 11/23/17 0218  INR 2.0 2.01 2.10 3.05 3.64   Cardiac Enzymes: Recent Labs  Lab 11/20/17 1831  TROPONINI <0.03   BNP (last 3 results) No results for input(s): PROBNP in the last 8760 hours. HbA1C: No results for input(s): HGBA1C in the last 72 hours. CBG: Recent Labs  Lab 11/22/17 0746 11/22/17 1203 11/22/17 1608 11/22/17 2211 11/23/17 0750  GLUCAP 194* 239* 211* 232* 179*   Lipid Profile: No results for input(s): CHOL, HDL, LDLCALC, TRIG, CHOLHDL, LDLDIRECT in the last 72 hours. Thyroid Function Tests: No results for input(s): TSH, T4TOTAL, FREET4, T3FREE, THYROIDAB in the last 72 hours. Anemia Panel: No results for input(s): VITAMINB12, FOLATE, FERRITIN, TIBC, IRON, RETICCTPCT in the last 72 hours. Sepsis Labs: Recent Labs  Lab 11/20/17 1353 11/20/17 1643  LATICACIDVEN 1.38 2.20*    Recent Results (from the past 240 hour(s))  Blood culture (routine x 2)     Status: Abnormal (Preliminary result)   Collection Time: 11/20/17  4:30 PM  Result Value Ref Range Status   Specimen Description BLOOD RIGHT ANTECUBITAL  Final   Special Requests   Final    BOTTLES DRAWN AEROBIC AND ANAEROBIC Blood  Culture adequate volume   Culture  Setup Time   Final    GRAM NEGATIVE RODS IN BOTH AEROBIC AND ANAEROBIC BOTTLES CRITICAL RESULT CALLED TO, READ BACK BY AND VERIFIED WITH: M MACCIA,PHARMD AT 1547 11/21/17 BY L BENFIELD    Culture (A)  Final    HAEMOPHILUS INFLUENZAE BETA LACTAMASE POSITIVE HEALTH DEPARTMENT NOTIFIED Performed at [facility], [place] [address], [place], [place] [address]    Report Status PENDING  Incomplete  Blood culture (routine x 2)     Status: Abnormal (Preliminary result)   Collection Time: 11/20/17  4:35 PM  Result Value Ref Range Status   Specimen Description BLOOD RIGHT HAND  Final   Special Requests   Final    BOTTLES DRAWN AEROBIC AND ANAEROBIC Blood Culture adequate volume   Culture  Setup Time   Final    GRAM NEGATIVE RODS IN BOTH AEROBIC AND ANAEROBIC BOTTLES CRITICAL  RESULT CALLED TO, READ BACK BY AND VERIFIED WITH: Ailene Rud AT 1547 11/21/17 BY L BENFIELD    Culture (A)  Final    HAEMOPHILUS INFLUENZAE BETA LACTAMASE POSITIVE HEALTH DEPARTMENT NOTIFIED Referred to [facility] in [place], [place] for Serotyping. Performed at [facility], [place] [address] [place], [place] [address]    Report Status PENDING  Incomplete  Blood Culture ID Panel (Reflexed)     Status: Abnormal   Collection Time: 11/20/17  4:35 PM  Result Value Ref Range Status   Enterococcus species NOT DETECTED NOT DETECTED Final   Listeria monocytogenes NOT DETECTED NOT DETECTED Final   Staphylococcus species NOT DETECTED NOT DETECTED Final   Staphylococcus aureus NOT DETECTED NOT DETECTED Final   Streptococcus species NOT DETECTED NOT DETECTED Final   Streptococcus agalactiae NOT DETECTED NOT DETECTED Final   Streptococcus pneumoniae NOT DETECTED NOT DETECTED Final   Streptococcus pyogenes NOT DETECTED NOT DETECTED Final   Acinetobacter baumannii NOT DETECTED NOT DETECTED Final   Enterobacteriaceae species NOT DETECTED NOT DETECTED Final     Enterobacter cloacae complex NOT DETECTED NOT DETECTED Final   Escherichia coli NOT DETECTED NOT DETECTED Final   Klebsiella oxytoca NOT DETECTED NOT DETECTED Final   Klebsiella pneumoniae NOT DETECTED NOT DETECTED Final   Proteus species NOT DETECTED NOT DETECTED Final   Serratia marcescens NOT DETECTED NOT DETECTED Final   Haemophilus influenzae DETECTED (A) NOT DETECTED Final    Comment: CRITICAL RESULT CALLED TO, READ BACK BY AND VERIFIED WITH: M MACCIA,PHARMD AT 1547 11/21/17 BY L BENFIELD    Neisseria meningitidis NOT DETECTED NOT DETECTED Final   Pseudomonas aeruginosa NOT DETECTED NOT DETECTED Final   Candida albicans NOT DETECTED NOT DETECTED Final   Candida glabrata NOT DETECTED NOT DETECTED Final   Candida krusei NOT DETECTED NOT DETECTED Final   Candida parapsilosis NOT DETECTED NOT DETECTED Final   Candida tropicalis NOT DETECTED NOT DETECTED Final    Comment: Performed at [facility], [address] [place], [place] [address]  Culture, sputum-assessment     Status: None   Collection Time: 11/20/17  6:32 PM  Result Value Ref Range Status   Specimen Description EXPECTORATED SPUTUM  Final   Special Requests Normal  Final   Sputum evaluation   Final    THIS SPECIMEN IS ACCEPTABLE FOR SPUTUM CULTURE Performed at [facility], [address], [place], [place] [address]    Report Status 11/20/2017 FINAL  Final  Culture, respiratory (NON-Expectorated)     Status: None   Collection Time: 11/20/17  6:32 PM  Result Value Ref Range Status   Specimen Description EXPECTORATED SPUTUM  Final   Special Requests Normal Reflexed from [id number]  Final   Gram Stain   Final    MODERATE WBC PRESENT, PREDOMINANTLY PMN RARE SQUAMOUS EPITHELIAL CELLS PRESENT ABUNDANT GRAM POSITIVE COCCI IN PAIRS IN CLUSTERS MODERATE GRAM NEGATIVE RODS    Culture   Final    Consistent with normal respiratory flora. Performed at [facility], [place] [address] [place], [place] [address]     Report Status 11/23/2017 FINAL  Final  MRSA PCR Screening     Status: None   Collection Time: 11/20/17  8:28 PM  Result Value Ref Range Status   MRSA by PCR NEGATIVE NEGATIVE Final    Comment:        The GeneXpert MRSA Assay (FDA approved for NASAL specimens only), is one component of a  comprehensive MRSA colonization surveillance program. It is not intended to diagnose MRSA infection nor to guide or monitor treatment for MRSA infections. Performed at [facility], [place] [address], [place], [place] [address]          Radiology Studies: Dg Chest 1 View  Result Date: 11/23/2017 CLINICAL DATA:  Hypoxia.  Atrial fibrillation. EXAM: CHEST  1 VIEW COMPARISON:  November 22, 2017 FINDINGS: There is opacity throughout much of the left lung, in part due to pleural effusion. There is also patchy airspace consolidation throughout much of the left lung. There is slight atelectatic change in the right base. The right lung is otherwise clear. Heart is mildly enlarged with pulmonary vascularity within normal limits. Pacemaker lead is attached to the right ventricle. No adenopathy. There is aortic atherosclerosis. There is degenerative change in each shoulder. IMPRESSION: Opacity throughout much of the left lung likely due to a combination of pleural effusion and airspace consolidation, essentially stable. Slight right base atelectasis. Right lung otherwise clear. Stable cardiomegaly. Stable pacemaker placement. There is aortic atherosclerosis. Aortic Atherosclerosis (ICD10-I70.0). Electronically Signed   By: Lowella Grip III M.D.   On: 11/23/2017 08:17   Dg Chest 1 View  Result Date: 11/22/2017 CLINICAL DATA:  Progressive cough, congestion, and shortness of breath. Fall. EXAM: CHEST  1 VIEW COMPARISON:  Two-view chest x-ray 11/20/2017 FINDINGS: The heart size is exaggerated by low lung volumes. Increased asymmetric left-sided interstitial and airspace disease is noted. A left pleural effusion is  present. Right-sided interstitial disease is increasing. IMPRESSION: 1. Increasing interstitial and airspace disease in the left lung consistent with edema and infection. 2. Increasing left pleural effusion. 3. Progressive edema in the right lung. Electronically Signed   By: San Morelle M.D.   On: 11/22/2017 07:34        Scheduled Meds: . atorvastatin  20 mg Oral q1800  . carvedilol  3.125 mg Oral BID  . fenofibrate  54 mg Oral Daily  . furosemide  40 mg Intravenous BID  . glipiZIDE  2.5 mg Oral Q lunch  . guaiFENesin  600 mg Oral BID  . insulin aspart  0-5 Units Subcutaneous QHS  . insulin aspart  0-9 Units Subcutaneous TID WC  . ipratropium-albuterol  3 mL Nebulization TID  . linagliptin  5 mg Oral Daily  . mouth rinse  15 mL Mouth Rinse BID  . methylPREDNISolone (SOLU-MEDROL) injection  40 mg Intravenous Q12H  . Warfarin - Pharmacist Dosing Inpatient   Does not apply q1800   Continuous Infusions: . cefTRIAXone (ROCEPHIN)  IV Stopped (11/22/17 1609)     LOS: 3 days     Georgette Shell, MD [facility]  If 7PM-7AM, please contact night-coverage [email or web address] Password [email or web address] 11/23/2017, 12:15 PM

## 2017-11-24 ENCOUNTER — Ambulatory Visit: Payer: Medicare Other | Admitting: Physical Therapy

## 2017-11-24 DIAGNOSIS — R590 Localized enlarged lymph nodes: Secondary | ICD-10-CM

## 2017-11-24 DIAGNOSIS — C9190 Lymphoid leukemia, unspecified not having achieved remission: Secondary | ICD-10-CM

## 2017-11-24 DIAGNOSIS — J181 Lobar pneumonia, unspecified organism: Secondary | ICD-10-CM

## 2017-11-24 DIAGNOSIS — J189 Pneumonia, unspecified organism: Secondary | ICD-10-CM

## 2017-11-24 DIAGNOSIS — R05 Cough: Secondary | ICD-10-CM

## 2017-11-24 LAB — PROTIME-INR
INR: 3.66
Prothrombin Time: 36.1 seconds — ABNORMAL HIGH (ref 11.4–15.2)

## 2017-11-24 LAB — BASIC METABOLIC PANEL
Anion gap: 8 (ref 5–15)
BUN: 22 mg/dL — ABNORMAL HIGH (ref 6–20)
CHLORIDE: 96 mmol/L — AB (ref 101–111)
CO2: 28 mmol/L (ref 22–32)
CREATININE: 0.96 mg/dL (ref 0.61–1.24)
Calcium: 8.6 mg/dL — ABNORMAL LOW (ref 8.9–10.3)
GFR calc non Af Amer: >60 mL/min (ref >60)
Glucose, Bld: 350 mg/dL — ABNORMAL HIGH (ref 65–99)
POTASSIUM: 4.8 mmol/L (ref 3.5–5.1)
SODIUM: 132 mmol/L — AB (ref 135–145)

## 2017-11-24 LAB — GLUCOSE, CAPILLARY
GLUCOSE-CAPILLARY: 415 mg/dL — AB (ref 65–99)
Glucose-Capillary: 438 mg/dL — ABNORMAL HIGH (ref 65–99)
Glucose-Capillary: 462 mg/dL — ABNORMAL HIGH (ref 65–99)
Glucose-Capillary: 308 mg/dL — ABNORMAL HIGH (ref 65–99)
Glucose-Capillary: 292 mg/dL — ABNORMAL HIGH (ref 65–99)

## 2017-11-24 LAB — CBC
HEMATOCRIT: 29.5 % — AB (ref 39.0–52.0)
HEMOGLOBIN: 9.9 g/dL — AB (ref 13.0–17.0)
MCH: 32.2 pg (ref 26.0–34.0)
MCHC: 33.6 g/dL (ref 30.0–36.0)
MCV: 96.1 fL (ref 78.0–100.0)
Platelets: 463 10*3/uL — ABNORMAL HIGH (ref 150–400)
RBC: 3.07 MIL/uL — AB (ref 4.22–5.81)
RDW: 18 % — ABNORMAL HIGH (ref 11.5–15.5)
WBC: 46.7 10*3/uL — AB (ref 4.0–10.5)

## 2017-11-24 LAB — GLUCOSE, RANDOM: Glucose, Bld: 508 mg/dL (ref 65–99)

## 2017-11-24 LAB — CULTURE, BLOOD (ROUTINE X 2): Special Requests: ADEQUATE

## 2017-11-24 MED ORDER — METHYLPREDNISOLONE SODIUM SUCC 40 MG IJ SOLR
40.0000 mg | Freq: Every day | INTRAMUSCULAR | Status: DC
  Administered 2017-11-25 – 2017-11-29 (×5): 40 mg via INTRAVENOUS
  Filled 2017-11-24 (×5): qty 1

## 2017-11-24 MED ORDER — INSULIN ASPART 100 UNIT/ML ~~LOC~~ SOLN
10.0000 [IU] | Freq: Once | SUBCUTANEOUS | Status: AC
  Administered 2017-11-24: 10 [IU] via SUBCUTANEOUS

## 2017-11-24 MED ORDER — INSULIN GLARGINE 100 UNIT/ML ~~LOC~~ SOLN
22.0000 [IU] | Freq: Every day | SUBCUTANEOUS | Status: DC
  Administered 2017-11-24 – 2017-11-27 (×4): 22 [IU] via SUBCUTANEOUS
  Filled 2017-11-24 (×4): qty 0.22

## 2017-11-24 MED ORDER — PHYTONADIONE 5 MG PO TABS
5.0000 mg | ORAL_TABLET | Freq: Once | ORAL | Status: AC
  Administered 2017-11-24: 5 mg via ORAL
  Filled 2017-11-24: qty 1

## 2017-11-24 NOTE — Progress Notes (Signed)
Inpatient Diabetes Program Recommendations  AACE/ADA: New Consensus Statement on Inpatient Glycemic Control (2015)  Target Ranges:  Prepandial:   less than 140 mg/dL      Peak postprandial:   less than 180 mg/dL (1-2 hours)      Critically ill patients:  140 - 180 mg/dL   Lab Results  Component Value Date   GLUCAP 438 (H) 11/24/2017   HGBA1C 6.0 (H) 05/05/2016    Review of Glycemic Control Results for GENEROSO, CROPPER (MRN [id number]) as of 11/24/2017 11:02  Ref. Range 11/23/2017 18:35 11/23/2017 21:53 11/24/2017 07:54 11/24/2017 09:13  Glucose-Capillary Latest Ref Range: 65 - 99 mg/dL 338 (H) 342 (H) 415 (H) 438 (H)   Diabetes history: Type 2 DM Outpatient Diabetes medications: Glipizide 2.5 mg QD at lunch, Januvia 100 mg QD, Metformin 1000 mg BID Current orders for Inpatient glycemic control: Novolog 0-9 units TID, Novolog 0-5 units QHS, Lantus 15 units QHS, Novolog 4 units TID  Inpatient Diabetes Program Recommendations:    Spoke with RN to verify orders and to ensure insulin administration. Noted Novolog 10 units X 1 and decrease to steroids to Solumedrol 40 mg QD. At this time would recommend increasing Lantus to 22 units QHS.  Thanks, Bronson Curb, MSN, RNC-OB Diabetes Coordinator [phone] (8a-5p)

## 2017-11-24 NOTE — Plan of Care (Signed)
Discussed plan of care with patient.  Good teach back displayed.

## 2017-11-24 NOTE — Progress Notes (Addendum)
Name: Oscar Baker MRN: [id number] DOB: 09/28/1932    ADMISSION DATE:  11/20/2017 CONSULTATION DATE: 11/23/2017  REFERRING MD : [facility]  CHIEF COMPLAINT: Chronic cough, green sputum production for over a year  BRIEF PATIENT DESCRIPTION: 84 y/o M admitted 6/1 with chronic cough, green sputum production.  Prior pulmonary work up for cough by Dr. Melvyn Novas included recommendation for allergy follow up (pt has not yet completed).  Concern for aspiration with eating > SLP evaluation demonstrated aspiration.  Found to have a left pleural effusion, PCCM consulted 6/4 for evaluation.  06/2017 ECHO demonstrated an EF of 45-50%, LVH, hypokinesis of basilar mid inferior myocardium. Pt reported ongoing green sputum production despite being on antibiotics.His cough is very hoarse and explosive.  His wife notes that his coughing does escalate during times of eating.  She also she further notes he is been coughing like this for over a year.   PMH:  Prostate cancer s/p prostatectomy / radiation and hormone therapy followed by Dr. Diona Fanti, Dr. Alen Blew.  CLL with no treatment, monitored by ONC.  PAF on coumadin, pacemaker, DM, Anemia, HTN.   SUBJECTIVE:  Pt reports completing swallow eval with concern for aspiration.  Wife at bedside.    VITAL SIGNS: Temp:  [97.4 F (36.3 C)-98.1 F (36.7 C)] 98 F (36.7 C) (06/05 0413) Pulse Rate:  [60-64] 61 (06/05 0756) Resp:  [15-26] 15 (06/05 0756) BP: (108-122)/(58-74) 122/70 (06/05 0756) SpO2:  [90 %-94 %] 91 % (06/05 0756)  PHYSICAL EXAMINATION: General: elderly male in NAD, sitting up in chair  HEENT: MM pink/moist, good dentition, HOH / bilateral hearing aides Neuro: AAOx4, speech clear, MAE  CV: s1s2 rrr, no m/r/g PULM: even/non-labored, clear on right, diminished on L NT:ZGYF, non-tender, bsx4 active  Extremities: warm/dry, BLE 1+ edema  Skin: no rashes or lesions  Recent Labs  Lab 11/22/17 0241 11/23/17 0218 11/24/17 0306  NA 130* 131* 132*  K 4.4  4.6 4.8  CL 99* 98* 96*  CO2 _0 BUN 17 19 22*  CREATININE 0.82 0.94 0.96  GLUCOSE 190* 226* 350*   Recent Labs  Lab 11/22/17 0241 11/23/17 0218 11/24/17 0306  HGB 10.1* 10.8* 9.9*  HCT 30.3* 33.1* 29.5*  WBC 47.3* 60.0* 46.7*  PLT 381 455* 463*   Dg Chest 1 View  Result Date: 11/23/2017 CLINICAL DATA:  Hypoxia.  Atrial fibrillation. EXAM: CHEST  1 VIEW COMPARISON:  November 22, 2017 FINDINGS: There is opacity throughout much of the left lung, in part due to pleural effusion. There is also patchy airspace consolidation throughout much of the left lung. There is slight atelectatic change in the right base. The right lung is otherwise clear. Heart is mildly enlarged with pulmonary vascularity within normal limits. Pacemaker lead is attached to the right ventricle. No adenopathy. There is aortic atherosclerosis. There is degenerative change in each shoulder. IMPRESSION: Opacity throughout much of the left lung likely due to a combination of pleural effusion and airspace consolidation, essentially stable. Slight right base atelectasis. Right lung otherwise clear. Stable cardiomegaly. Stable pacemaker placement. There is aortic atherosclerosis. Aortic Atherosclerosis (ICD10-I70.0). Electronically Signed   By: Lowella Grip III M.D.   On: 11/23/2017 08:17   Ct Chest Wo Contrast  Result Date: 11/23/2017 CLINICAL DATA:  Left pleural effusion. EXAM: CT CHEST WITHOUT CONTRAST TECHNIQUE: Multidetector CT imaging of the chest was performed following the standard protocol without IV contrast. COMPARISON:  Chest x-ray from same day. CT chest dated April 17, 2014. FINDINGS:  Cardiovascular: Mild cardiomegaly with unchanged left chest wall pacemaker. No pericardial effusion. Normal caliber thoracic aorta. Coronary, aortic arch, and branch vessel atherosclerotic vascular disease. Mediastinum/Nodes: Multiple prominent bilateral axillary, left supraclavicular, and mediastinal lymph nodes measuring up to 10  mm in short axis are overall similar to prior study. The thyroid gland trachea, and esophagus demonstrate no significant findings. Lungs/Pleura: Moderate to large left pleural effusion. Trace right pleural effusion. Complete collapse of the left lower lobe. Areas of mucous impaction in the right lower lobe with subsegmental atelectasis. Mild diffuse peribronchial thickening. No consolidation or pneumothorax. No suspicious pulmonary nodule. Mild mosaic attenuation throughout the lungs. Upper Abdomen: No acute abnormality. Musculoskeletal: No chest wall abnormality. No acute or significant osseous findings. Degenerative changes of the thoracic spine. IMPRESSION: 1. Moderate to large left pleural effusion with complete collapse of the left lower lobe. 2. Trace right pleural effusion with right lower lobe subsegmental atelectasis. 3. Mild mosaic attenuation throughout the lungs could reflect chronic small airways or small vessel disease. 4. Prominent bilateral axillary, left supraclavicular, and mediastinal lymph nodes measuring up to 10 mm in short axis are overall similar to prior study and likely reflect patient's history of chronic lymphocytic leukemia. 5.  Aortic atherosclerosis (ICD10-I70.0). Electronically Signed   By: Titus Dubin M.D.   On: 11/23/2017 18:00   Dg Swallowing Func-speech Pathology  Result Date: 11/23/2017 Objective Swallowing Evaluation: Type of Study: MBS-Modified Barium Swallow Study  Patient Details Name: Oscar Baker MRN: [id number] Date of Birth: [birth year] Today's Date: 11/23/2017 Time: SLP Start Time (ACUTE ONLY): 2202 -SLP Stop Time (ACUTE ONLY): 1637 SLP Time Calculation (min) (ACUTE ONLY): 53 min Past Medical History: Past Medical History: Diagnosis Date . Asthma 1950's  history of . Atrial fibrillation ([place])  . Bilateral carpal tunnel syndrome  . Bilateral lower extremity edema  . Bladder tumor  . Chronic systolic heart failure (HCC)   Echo 1/19: Mild LVH, EF 45-50, inf HK, MAC,  severe LAE, severe RAE // Echo 7/15: Mild LVH, mod focal basal sept hypertrophy, EF 55-60, AV peak and mean 16/9, trivial MR, mod LAE, PASP 38 . CLL (chronic lymphocytic leukemia) [facility]) oncologist-  dr Ilene Qua--  dx [phone] ;  Lymphocytosis, CLL - per lov note 05-11-2017 currently under active survillance,  CT 04-17-2014 show very small lymphadenopathy, no indication for treatment . Coronary artery disease   cardiologist-  dr Cathie Olden--  08-18-2017 Intermittant risk nuclear study w/ large area of inferior infartion with no evidence ishcemia  . Deafness in right ear  . Diabetes mellitus type 2, noninsulin dependent ([place])  . Elevated PSA   since prostatectomy but now resolved . Hematuria 04/2017 . History of ear infection   Right . History of MI (myocardial infarction)   per myoview nuclear study 08-18-2017 , unknown when . History of shingles 08/2017  L ear and scalp, possible . Hyperlipidemia  . Hypertension  . Ischemic cardiomyopathy 09/01/2017  Presumed +CAD with Nuclear stress test 08/18/17 - Inferior scar, no ischemia, intermediate risk // med management unless +angina or worse dyspnea . OA (osteoarthritis)  . Pacemaker 02/08/2014  followed by dr g. taylor--  single chamber Biotronik due to SSS . Permanent atrial fibrillation ([place])  . Pneumonia 2019  Left lung . Prostate cancer [facility]) urologist-  dr Diona Fanti  dx 2004--  Gleason 8, PSA 10.45--  11-28-2002  s/p  radical prostatectomy;  recurrent w/ increasing PSA, started ADT treatment . RBBB (right bundle branch block)  . Sick sinus syndrome ([place])  a-Flutter with episodes of bradycardia; S/P Biotronik (serial number [id number]) 02-08-2014 . Urinary incontinence  . Wears hearing aid in right ear   receiver and transmitter Past Surgical History: Past Surgical History: Procedure Laterality Date . APPENDECTOMY   . BACK SURGERY    disk . CARDIAC CATHETERIZATION  09-03-1999  dr Cathie Olden  abnormal cardiolite study:  minor luminal irregularities but no critial coronary artery  stenosis . CARDIOVASCULAR STRESS TEST  08-18-2017  dr Cathie Olden  Intermediate risk nuclear study w/ large area inferior infarction, no evidence of ishcemia (consistant w/ prior MI)/  study not gated due to frequent PVCs . CARPAL TUNNEL RELEASE Right 2000 . CARPAL TUNNEL RELEASE Left 11/19/2009 . CATARACT EXTRACTION Right 07/2015 . CATARACT EXTRACTION Left 09/2015 . CYSTOSCOPY N/A 11/04/2017  Procedure: CYSTOSCOPY AND CAUTERIZATION OF BLADDER;  Surgeon: Franchot Gallo, MD;  Location: [facility];  Service: Urology;  Laterality: N/A; . INSERTION PENILE PROSTHESIS  02-22-2004    dr Mattie Marlin  [facility] . KNEE ARTHROSCOPY Left 07/2010 . PERMANENT PACEMAKER INSERTION N/A 02/08/2014  Procedure: PERMANENT PACEMAKER INSERTION;  Surgeon: Evans Lance, MD;  Location: [facility] CATH LAB;  Service: Cardiovascular;  Laterality: N/A; . RADICAL RETROPUBIC PROSTATECTOMY W/ BILATERAL PELVIC LYMPH NODE DISSECTION  11-28-2002   dr Mattie Marlin  [facility] . TONSILLECTOMY   . TOTAL HIP ARTHROPLASTY Left 05/12/2016  Procedure: LEFT TOTAL HIP ARTHROPLASTY ANTERIOR APPROACH;  Surgeon: Paralee Cancel, MD;  Location: [facility]S;  Service: Orthopedics;  Laterality: Left; . TOTAL HIP ARTHROPLASTY Right 07-15-2006   dr Alvan Dame  [facility] . TRANSTHORACIC ECHOCARDIOGRAM  07/20/2017  mild LVH, ef 45-50%, hypokinesis of the basal-midinferior myocardium, due to AFib unable to evaluate diastolic function/  severe LAE and RAE/  trivial PR and TR HPI: Pt is an 84 year-old male with a history of CLL, prostate cancer, sick sinus syndrome, pacemaker, paroxysmal atrial fibrillation, CHF with ejection fraction 45% admitted with LLL PNA. Per consult note from critical care, pt/wife describe cyclic cough for over a year, with coughing sometimes escalating during meals, especially with dry foods.  Subjective: alert, denies swallowing difficulty Assessment / Plan / Recommendation CHL IP CLINICAL IMPRESSIONS 11/23/2017 Clinical Impression Pt has a mild oropharyngeal dysphagia with  structural component due to suspected osteophytes most prominent at C3-4, C4-5 that impede full epiglottic deflection and airway closure. What further impacts his ability to protect his airway is his impulsive intake (large boluses and fast rate) and premature spillage. Thin and nectar thick liquids spill into the pharynx, filling the valleculae and spilling onward to the pyriform sinuses before the swallow. Then, as he swallows, the thin liquids spills into the partially open airway, allowing for trace amounts of penetration and aspiration. Pt had significant baseline coughing that made it very challenging to determine whether coughing was related to aspiration or not. SLP provided Mod cues for attempts at a chin tuck, oral holding, and smaller bolus sizes. Small sips were the most effective at protecting the airway with thin liquids although I suspect if he could have performed the oral hold that this would further increase his safety with swallowing. Unfortunately, pt could not consistently utilize these strategies. No airway compromise occurred with thicker liquids or solids, although pt did have to take intermittent pauses during oral prep with solids to catch his breath. Although pt's dysphagia is mild, considering his impulsivity as well as his recurrent PNA and chronic cough, would favor starting a more conservative diet at this time: Dys 3 diet and nectar thick liquids. SLP  will f/u to facilitate hopeful transition back to thin liquids with improved ability to take small sips, contain liquids orally, and increase oral care/use of aspiration precautions.  SLP Visit Diagnosis Dysphagia, oropharyngeal phase (R13.12) Attention and concentration deficit following -- Frontal lobe and executive function deficit following -- Impact on safety and function Mild aspiration risk   CHL IP TREATMENT RECOMMENDATION 11/23/2017 Treatment Recommendations Therapy as outlined in treatment plan below   Prognosis 11/23/2017  Prognosis for Safe Diet Advancement Good Barriers to Reach Goals -- Barriers/Prognosis Comment -- CHL IP DIET RECOMMENDATION 11/23/2017 SLP Diet Recommendations Dysphagia 3 (Mech soft) solids;Nectar thick liquid Liquid Administration via Cup Medication Administration Whole meds with puree Compensations Slow rate;Small sips/bites Postural Changes Seated upright at 90 degrees   CHL IP OTHER RECOMMENDATIONS 11/23/2017 Recommended Consults -- Oral Care Recommendations Oral care BID Other Recommendations Order thickener from pharmacy;Prohibited food (jello, ice cream, thin soups);Remove water pitcher   CHL IP FOLLOW UP RECOMMENDATIONS 11/23/2017 Follow up Recommendations Outpatient SLP;Home health SLP   CHL IP FREQUENCY AND DURATION 11/23/2017 Speech Therapy Frequency (ACUTE ONLY) min 2x/week Treatment Duration 2 weeks      CHL IP ORAL PHASE 11/23/2017 Oral Phase Impaired Oral - Pudding Teaspoon -- Oral - Pudding Cup -- Oral - Honey Teaspoon -- Oral - Honey Cup -- Oral - Nectar Teaspoon -- Oral - Nectar Cup Decreased bolus cohesion;Premature spillage Oral - Nectar Straw -- Oral - Thin Teaspoon -- Oral - Thin Cup Decreased bolus cohesion;Premature spillage Oral - Thin Straw Decreased bolus cohesion;Premature spillage Oral - Puree Piecemeal swallowing Oral - Mech Soft Delayed oral transit Oral - Regular -- Oral - Multi-Consistency -- Oral - Pill -- Oral Phase - Comment --  CHL IP PHARYNGEAL PHASE 11/23/2017 Pharyngeal Phase Impaired Pharyngeal- Pudding Teaspoon -- Pharyngeal -- Pharyngeal- Pudding Cup -- Pharyngeal -- Pharyngeal- Honey Teaspoon -- Pharyngeal -- Pharyngeal- Honey Cup -- Pharyngeal -- Pharyngeal- Nectar Teaspoon -- Pharyngeal -- Pharyngeal- Nectar Cup Reduced epiglottic inversion;Reduced airway/laryngeal closure;Delayed swallow initiation-pyriform sinuses Pharyngeal -- Pharyngeal- Nectar Straw -- Pharyngeal -- Pharyngeal- Thin Teaspoon -- Pharyngeal -- Pharyngeal- Thin Cup Reduced epiglottic inversion;Reduced  airway/laryngeal closure;Delayed swallow initiation-pyriform sinuses;Penetration/Aspiration during swallow Pharyngeal Material enters airway, CONTACTS cords and not ejected out Pharyngeal- Thin Straw Reduced epiglottic inversion;Reduced airway/laryngeal closure;Delayed swallow initiation-pyriform sinuses;Penetration/Aspiration during swallow Pharyngeal Material enters airway, passes BELOW cords without attempt by patient to eject out (silent aspiration) Pharyngeal- Puree Reduced epiglottic inversion;Reduced airway/laryngeal closure Pharyngeal -- Pharyngeal- Mechanical Soft Reduced epiglottic inversion;Reduced airway/laryngeal closure Pharyngeal -- Pharyngeal- Regular -- Pharyngeal -- Pharyngeal- Multi-consistency -- Pharyngeal -- Pharyngeal- Pill -- Pharyngeal -- Pharyngeal Comment --  CHL IP CERVICAL ESOPHAGEAL PHASE 11/23/2017 Cervical Esophageal Phase WFL Pudding Teaspoon -- Pudding Cup -- Honey Teaspoon -- Honey Cup -- Nectar Teaspoon -- Nectar Cup -- Nectar Straw -- Thin Teaspoon -- Thin Cup -- Thin Straw -- Puree -- Mechanical Soft -- Regular -- Multi-consistency -- Pill -- Cervical Esophageal Comment -- No flowsheet data found. [name] 11/23/2017, 5:04 PM  [name], M.A. CCC-SLP [phone]             SIGNIFICANT EVENTS  6/01  Admit  6/04  PCCM consulted   STUDIES:  SLP 6/5 >>   ASSESSMENT:  Discussion:  84 y/o M admitted with cough with green sputum production that has been occurring for greater than one year.  Concern for aspiration PNA.  Chronic nature of cough appears to be related to recurrent aspiration events.  LLL infiltrate appears to be chronic but associated  effusion is larger than prior exams.   HCAP in setting of recurrent Aspiration  Acute Hypoxemic Respiratory Failure - in setting of aspiration, effusion  Left Pleural Effusion  Laryngopharyngeal Reflux Disease / Dysphagia  Chronic Cough  Allergic Rhinitis  H. Influenzae Bacteremia - sputum negative but  suspect from pulmonary source CLL  P: Appreciate SLP, rec's for D3 diet / nectar thick liquids Aspiration precautions  Will need thoracentesis but will need INR to be 2 or less.  Last dose coumadin 6/3.  Repeat INR in am 6/6, if not in range, may need FFP.   One dose 59m Vitamin K today, ok for patient to eat greens 6/5 Wean O2 for saturations of 88-94% Reduce solumedrol to 40 mg QD, wean to off Will assess with [place]6/5 to review pleural space Follow intermittent CXR  Pulmonary hygiene - IS, mobilize    BNoe Gens NP-C [facility] Pgr: [phone] or if no answer [phone]/10/2017, 10:21 AM  Attending Note:  84year old male with chronic cough that seems to be related to aspiration.  Patient returns with cough productive of green sputum and concern for aspiration pneumonia, pleural effusion, hypoxemia and dysphagia.  On exam, crackles L>R noted.  I reviewed CXR myself, infiltrate on the left noted.  I reviewed chest CT myself, bilateral infiltrate L>R noted and signs of chronic inflammation and LAN noted.  Discussed with PCCM-NP.  Infiltrate, concern for PNA  - Treat with abx for now  - F/u on cultures  - Culture pleural fluid when taped   Effusion:  - Vitamin K   - INR in AM  - If INR<2 then thora  LAN: likely reactive  - F/U imaging after infection is treated  Dysphagia: confirmed  - Dysphagia 3 diet and care while swallowing  Hypoxemia:  - Titrate O2 for sat of 88-92%.  Cough: likely related to aspiration  - Speech pathology  - Care with feedings  PCCM will continue to follow  Patient seen and examined, agree with above note.  I dictated the care and orders written for this patient under my direction.  YRush Farmer MBuckland

## 2017-11-24 NOTE — Progress Notes (Signed)
Physical Therapy Treatment Patient Details Name: Oscar Baker MRN: [id number] DOB: 11-Dec-1932 Today's Date: 11/24/2017    History of Present Illness 84 year old male with a history of CLL, prostate cancer, sick sinus syndrome, pacemaker, paroxysmal atrial fibrillation, CHF with ejection fraction 45% admitted with fever increasing shortness of breath cough with yellow to green phlegm for 1 week prior to admission.  He was found to have  left lower lobe pneumonia with associated effusion    PT Comments    Pt making good progress towards his goals, however limited in safe mobility by oxygen desaturation (see General Comments) and decreased balance. Pt reports that he walked with nursing earlier and that he is feeling stronger today. Pt currently requires supervision for bed mobility, min guard for transfers and ambulation of 300 feet with RW. D/c recommendations remain appropriate at this time. PT will continue to follow acutely until d/c.     Follow Up Recommendations  Outpatient PT;Supervision/Assistance - 24 hour     Equipment Recommendations  Other (comment)(pt came with RW and it has been lost in hospital may need ne)    Recommendations for Other Services       Precautions / Restrictions Precautions Precautions: Fall Restrictions Weight Bearing Restrictions: No    Mobility  Bed Mobility Overal bed mobility: Modified Independent(requires increased time and effort)             General bed mobility comments: utilizes bedrail to pull to EoB  Transfers Overall transfer level: Needs assistance   Transfers: Sit to/from Stand Sit to Stand: Min guard         General transfer comment: min guard for safety, good power up and steadying with RW  Ambulation/Gait Ambulation/Gait assistance: Min guard Ambulation Distance (Feet): 300 Feet Assistive device: Rolling walker (2 wheeled) Gait Pattern/deviations: Step-through pattern;Decreased stride length;Trunk  flexed;Shuffle Gait velocity: slowed Gait velocity interpretation: 1.31 - 2.62 ft/sec, indicative of limited community ambulator General Gait Details: hands-on min guard for safety, vc for upright posture, slow, steady gait.          Balance Overall balance assessment: Needs assistance Sitting-balance support: No upper extremity supported;Feet supported Sitting balance-Leahy Scale: Fair     Standing balance support: Bilateral upper extremity supported Standing balance-Leahy Scale: Poor Standing balance comment: requires B UE support                             Cognition Arousal/Alertness: Awake/alert Behavior During Therapy: WFL for tasks assessed/performed Overall Cognitive Status: Within Functional Limits for tasks assessed                                           General Comments General comments (skin integrity, edema, etc.): pt on 2L O2 via nasal cannula, SaO2 at rest 98%O2, with ambulation O2 dropped to 83%, however poor waveform and with standing rest break and relaxation of hand with pulse ox meter SaO2 rebounded to 92%O2      Pertinent Vitals/Pain Pain Assessment: No/denies pain           PT Goals (current goals can now be found in the care plan section) Acute Rehab PT Goals Patient Stated Goal: stop coughing PT Goal Formulation: With patient Time For Goal Achievement: 12/06/17 Potential to Achieve Goals: Fair    Frequency    Min 3X/week  PT Plan Current plan remains appropriate       AM-PAC PT "6 Clicks" Daily Activity  Outcome Measure  Difficulty turning over in bed (including adjusting bedclothes, sheets and blankets)?: A Lot Difficulty moving from lying on back to sitting on the side of the bed? : A Lot Difficulty sitting down on and standing up from a chair with arms (e.g., wheelchair, bedside commode, etc,.)?: Unable Help needed moving to and from a bed to chair (including a wheelchair)?: A Little Help  needed walking in hospital room?: A Little Help needed climbing 3-5 steps with a railing? : A Lot 6 Click Score: 13    End of Session Equipment Utilized During Treatment: Gait belt;Oxygen Activity Tolerance: Patient tolerated treatment well Patient left: in chair;with call bell/phone within reach;with chair alarm set;with nursing/sitter in room Nurse Communication: Mobility status;Patient requests pain meds PT Visit Diagnosis: Unsteadiness on feet (R26.81);Other abnormalities of gait and mobility (R26.89);Muscle weakness (generalized) (M62.81);Difficulty in walking, not elsewhere classified (R26.2);Repeated falls (R29.6);History of falling (Z91.81);Pain     Time: 3825-0539 PT Time Calculation (min) (ACUTE ONLY): 24 min  Charges:  $Gait Training: 23-37 mins                    G Codes:       Julann Mcgilvray B. Migdalia Dk PT, DPT Acute Rehabilitation  [phone] Pager [phone]     [place] 11/24/2017, 5:06 PM

## 2017-11-24 NOTE — Procedures (Signed)
   Left Pleural [place] Assessment   Noe Gens, NP-C [facility] Pgr: [phone] or if no answer [phone] 11/24/2017, 11:54 AM

## 2017-11-24 NOTE — Progress Notes (Signed)
Patient ID: Oscar Baker, male   DOB: February 28, 1933, 84 y.o.   MRN: [id number]  PROGRESS NOTE    PRIMO INNIS  YYT:[id number] DOB: 1932-11-30 DOA: 11/20/2017 PCP: [name], MD     Brief Narrative:   84 year-old male with a history of CLL, prostate cancer, sick sinus syndrome, pacemaker, paroxysmal atrial fibrillation, CHF with ejection fraction 45% admitted with fever increasing shortness of breath cough with yellow to green phlegm for 1 week prior to admission. He was found to have left lower lobe pneumonia with associated effusion and is admitted for the treatment of the same.    Assessment & Plan:   Principal Problem:   HCAP (healthcare-associated pneumonia) Active Problems:   HTN (hypertension)   Hyperlipidemia   Diabetes mellitus type 2, noninsulin dependent (HCC)   LPRD (laryngopharyngeal reflux disease)   Other allergic rhinitis   Anemia   Chronic cough   CLL (chronic lymphocytic leukemia) (HCC)   Pleural effusion  1]HCAP/H influenza bacteremia-patient improving clinically.  Currently on ceftriaxone for H influenza bacteremia. Blood culture came back positive for H. influenzae. Increasing leukocytosis secondary to CLL.  We will start him on steroids pulmonary consulted and are considering thoracentesis for his large pleural effusion.  2]paroxysmal atrial fibrillation with CHF continue carvedilol and Coumadin.  Increase the dose of Lasix to 40 mg twice a day for 2 more days.  Then reevaluate and adjust the dose as needed.  Chest x-ray done yesterday and today noted with left sided infiltrates and opacity and pleural effusion. Patient is not on potassium replacement acute remains normal at 4. 8  follow-up tomorrow.  Echocardiogram with ejection fraction 45 to 50% January 2019.  3]history of hypertension continue carvedilol and Lasix.  4]type 2 diabetes  blood sugar has been elevated.  Increasing sliding scale needs and Lantus 20 to 22 units nightly weaning  steroids.   5]hyponatremia-Secondary to pneumonia follow-up levels.   Improving slowly      DVT prophylaxis: SCDs and on Coumadin  code Status: DNR  family Communication: Wife  disposition Plan: Unknown  Consultants:   Pulmonology  Procedures: Thoracentesis planned   Antimicrobials:   Rocephin   Subjective: Patient feeling much better today.  He feels like he is less short of breath.  He is wanting to go home.  Objective: Vitals:   11/23/17 2355 11/24/17 0413 11/24/17 0753 11/24/17 0756  BP: 112/74 (!) 113/58  122/70  Pulse: 64 62  61  Resp: (!) 22 (!) 22  15  Temp: 97.8 F (36.6 C) 98 F (36.7 C)    TempSrc: Oral Oral    SpO2: 91% 94% 92% 91%  Weight:      Height:        Intake/Output Summary (Last 24 hours) at 11/24/2017 0948 Last data filed at 11/24/2017 0550 Gross per 24 hour  Intake 100 ml  Output 4350 ml  Net -4250 ml   Filed Weights   11/20/17 2100 11/21/17 0537  Weight: 97.1 kg (214 lb 1.1 oz) 63 kg (138 lb 12.8 oz)    Examination:  General exam: Appears calm and comfortable  Respiratory system: Clear to auscultation. Respiratory effort normal.  Decreased breath sounds on the left Cardiovascular system: S1 & S2 heard, RRR. No JVD, murmurs, rubs, gallops or clicks. No pedal edema. Gastrointestinal system: Abdomen is nondistended, soft and nontender. No organomegaly or masses felt. Normal bowel sounds heard. Central nervous system: Alert and oriented. No focal neurological deficits. Extremities: Symmetric 5 x 5 power.  Skin: No rashes, lesions or ulcers Psychiatry: Judgement and insight appear normal. Mood & affect appropriate.     Data Reviewed: I have personally reviewed following labs and imaging studies  CBC: Recent Labs  Lab 11/20/17 1339 11/21/17 0226 11/22/17 0241 11/23/17 0218 11/24/17 0306  WBC 35.5* 40.6* 47.3* 60.0* 46.7*  NEUTROABS 12.8*  --   --   --   --   HGB 11.0* 10.1* 10.1* 10.8* 9.9*  HCT 33.3* 31.0* 30.3* 33.1*  29.5*  MCV 99.4 100.0 99.7 98.2 96.1  PLT 372 369 381 455* 503*   Basic Metabolic Panel: Recent Labs  Lab 11/20/17 1339 11/21/17 0226 11/22/17 0241 11/23/17 0218 11/24/17 0306  NA 136 137 130* 131* 132*  K 4.8 4.0 4.4 4.6 4.8  CL 105 105 99* 98* 96*  CO2 22 21* _0 GLUCOSE 256* 233* 190* 226* 350*  BUN _1 22*  CREATININE 1.07 0.97 0.82 0.94 0.96  CALCIUM 8.8* 8.4* 8.2* 8.7* 8.6*  MG  --   --  1.9  --   --    GFR: Estimated Creatinine Clearance: 52 mL/min (by C-G formula based on SCr of 0.96 mg/dL). Liver Function Tests: Recent Labs  Lab 11/20/17 1339 11/21/17 0226  AST 36 24  ALT 26 21  ALKPHOS 72 62  BILITOT 1.1 1.6*  PROT 6.8 5.7*  ALBUMIN 3.2* 2.6*   No results for input(s): LIPASE, AMYLASE in the last 168 hours. No results for input(s): AMMONIA in the last 168 hours. Coagulation Profile: Recent Labs  Lab 11/20/17 1339 11/21/17 0226 11/22/17 0241 11/23/17 0218 11/24/17 0306  INR 2.01 2.10 3.05 3.64 3.66   Cardiac Enzymes: Recent Labs  Lab 11/20/17 1831  TROPONINI <0.03   BNP (last 3 results) No results for input(s): PROBNP in the last 8760 hours. HbA1C: No results for input(s): HGBA1C in the last 72 hours. CBG: Recent Labs  Lab 11/23/17 1208 11/23/17 1835 11/23/17 2153 11/24/17 0754 11/24/17 0913  GLUCAP 315* 338* 342* 415* 438*   Lipid Profile: No results for input(s): CHOL, HDL, LDLCALC, TRIG, CHOLHDL, LDLDIRECT in the last 72 hours. Thyroid Function Tests: No results for input(s): TSH, T4TOTAL, FREET4, T3FREE, THYROIDAB in the last 72 hours. Anemia Panel: No results for input(s): VITAMINB12, FOLATE, FERRITIN, TIBC, IRON, RETICCTPCT in the last 72 hours. Sepsis Labs: Recent Labs  Lab 11/20/17 1353 11/20/17 1643  LATICACIDVEN 1.38 2.20*    Recent Results (from the past 240 hour(s))  Blood culture (routine x 2)     Status: Abnormal   Collection Time: 11/20/17  4:30 PM  Result Value Ref Range Status   Specimen  Description BLOOD RIGHT ANTECUBITAL  Final   Special Requests   Final    BOTTLES DRAWN AEROBIC AND ANAEROBIC Blood Culture adequate volume   Culture  Setup Time   Final    GRAM NEGATIVE RODS IN BOTH AEROBIC AND ANAEROBIC BOTTLES CRITICAL RESULT CALLED TO, READ BACK BY AND VERIFIED WITH: M MACCIA,PHARMD AT 1547 11/21/17 BY L BENFIELD    Culture (A)  Final    HAEMOPHILUS INFLUENZAE BETA LACTAMASE POSITIVE HEALTH DEPARTMENT NOTIFIED Performed at [facility], [place] [address] [place], [place] [address]    Report Status 11/24/2017 FINAL  Final  Blood culture (routine x 2)     Status: Abnormal (Preliminary result)   Collection Time: 11/20/17  4:35 PM  Result Value Ref Range Status   Specimen Description BLOOD RIGHT HAND  Final   Special Requests  Final    BOTTLES DRAWN AEROBIC AND ANAEROBIC Blood Culture adequate volume   Culture  Setup Time   Final    GRAM NEGATIVE RODS IN BOTH AEROBIC AND ANAEROBIC BOTTLES CRITICAL RESULT CALLED TO, READ BACK BY AND VERIFIED WITH: M MACCIA,PHARMD AT 1547 11/21/17 BY L BENFIELD    Culture (A)  Final    HAEMOPHILUS INFLUENZAE BETA LACTAMASE POSITIVE HEALTH DEPARTMENT NOTIFIED Referred to [facility] in [place], Eugenio Saenz for Serotyping. Performed at [facility], [place] [address], [place], [place] [address]    Report Status PENDING  Incomplete  Blood Culture ID Panel (Reflexed)     Status: Abnormal   Collection Time: 11/20/17  4:35 PM  Result Value Ref Range Status   Enterococcus species NOT DETECTED NOT DETECTED Final   Listeria monocytogenes NOT DETECTED NOT DETECTED Final   Staphylococcus species NOT DETECTED NOT DETECTED Final   Staphylococcus aureus NOT DETECTED NOT DETECTED Final   Streptococcus species NOT DETECTED NOT DETECTED Final   Streptococcus agalactiae NOT DETECTED NOT DETECTED Final   Streptococcus pneumoniae NOT DETECTED NOT DETECTED Final   Streptococcus pyogenes NOT DETECTED NOT DETECTED Final    Acinetobacter baumannii NOT DETECTED NOT DETECTED Final   Enterobacteriaceae species NOT DETECTED NOT DETECTED Final   Enterobacter cloacae complex NOT DETECTED NOT DETECTED Final   Escherichia coli NOT DETECTED NOT DETECTED Final   Klebsiella oxytoca NOT DETECTED NOT DETECTED Final   Klebsiella pneumoniae NOT DETECTED NOT DETECTED Final   Proteus species NOT DETECTED NOT DETECTED Final   Serratia marcescens NOT DETECTED NOT DETECTED Final   Haemophilus influenzae DETECTED (A) NOT DETECTED Final    Comment: CRITICAL RESULT CALLED TO, READ BACK BY AND VERIFIED WITH: M MACCIA,PHARMD AT 1547 11/21/17 BY L BENFIELD    Neisseria meningitidis NOT DETECTED NOT DETECTED Final   Pseudomonas aeruginosa NOT DETECTED NOT DETECTED Final   Candida albicans NOT DETECTED NOT DETECTED Final   Candida glabrata NOT DETECTED NOT DETECTED Final   Candida krusei NOT DETECTED NOT DETECTED Final   Candida parapsilosis NOT DETECTED NOT DETECTED Final   Candida tropicalis NOT DETECTED NOT DETECTED Final    Comment: Performed at [facility], [address] [place], [place] [address]  Culture, sputum-assessment     Status: None   Collection Time: 11/20/17  6:32 PM  Result Value Ref Range Status   Specimen Description EXPECTORATED SPUTUM  Final   Special Requests Normal  Final   Sputum evaluation   Final    THIS SPECIMEN IS ACCEPTABLE FOR SPUTUM CULTURE Performed at [facility], [address], [place], [place] [address]    Report Status 11/20/2017 FINAL  Final  Culture, respiratory (NON-Expectorated)     Status: None   Collection Time: 11/20/17  6:32 PM  Result Value Ref Range Status   Specimen Description EXPECTORATED SPUTUM  Final   Special Requests Normal Reflexed from [phone]  Final   Gram Stain   Final    MODERATE WBC PRESENT, PREDOMINANTLY PMN RARE SQUAMOUS EPITHELIAL CELLS PRESENT ABUNDANT GRAM POSITIVE COCCI IN PAIRS IN CLUSTERS MODERATE GRAM NEGATIVE RODS    Culture   Final     Consistent with normal respiratory flora. Performed at [facility], [place] [address] [place], [place] [address]    Report Status 11/23/2017 FINAL  Final  MRSA PCR Screening     Status: None   Collection Time: 11/20/17  8:28 PM  Result Value Ref Range Status  MRSA by PCR NEGATIVE NEGATIVE Final    Comment:        The GeneXpert MRSA Assay (FDA approved for NASAL specimens only), is one component of a comprehensive MRSA colonization surveillance program. It is not intended to diagnose MRSA infection nor to guide or monitor treatment for MRSA infections. Performed at [facility], [place] [address] [place], [place] [address]          Radiology Studies: Dg Chest 1 View  Result Date: 11/23/2017 CLINICAL DATA:  Hypoxia.  Atrial fibrillation. EXAM: CHEST  1 VIEW COMPARISON:  November 22, 2017 FINDINGS: There is opacity throughout much of the left lung, in part due to pleural effusion. There is also patchy airspace consolidation throughout much of the left lung. There is slight atelectatic change in the right base. The right lung is otherwise clear. Heart is mildly enlarged with pulmonary vascularity within normal limits. Pacemaker lead is attached to the right ventricle. No adenopathy. There is aortic atherosclerosis. There is degenerative change in each shoulder. IMPRESSION: Opacity throughout much of the left lung likely due to a combination of pleural effusion and airspace consolidation, essentially stable. Slight right base atelectasis. Right lung otherwise clear. Stable cardiomegaly. Stable pacemaker placement. There is aortic atherosclerosis. Aortic Atherosclerosis (ICD10-I70.0). Electronically Signed   By: Lowella Grip III M.D.   On: 11/23/2017 08:17   Ct Chest Wo Contrast  Result Date: 11/23/2017 CLINICAL DATA:  Left pleural effusion. EXAM: CT CHEST WITHOUT CONTRAST TECHNIQUE: Multidetector CT imaging of the chest was performed following the standard protocol without IV  contrast. COMPARISON:  Chest x-ray from same day. CT chest dated April 17, 2014. FINDINGS: Cardiovascular: Mild cardiomegaly with unchanged left chest wall pacemaker. No pericardial effusion. Normal caliber thoracic aorta. Coronary, aortic arch, and branch vessel atherosclerotic vascular disease. Mediastinum/Nodes: Multiple prominent bilateral axillary, left supraclavicular, and mediastinal lymph nodes measuring up to 10 mm in short axis are overall similar to prior study. The thyroid gland trachea, and esophagus demonstrate no significant findings. Lungs/Pleura: Moderate to large left pleural effusion. Trace right pleural effusion. Complete collapse of the left lower lobe. Areas of mucous impaction in the right lower lobe with subsegmental atelectasis. Mild diffuse peribronchial thickening. No consolidation or pneumothorax. No suspicious pulmonary nodule. Mild mosaic attenuation throughout the lungs. Upper Abdomen: No acute abnormality. Musculoskeletal: No chest wall abnormality. No acute or significant osseous findings. Degenerative changes of the thoracic spine. IMPRESSION: 1. Moderate to large left pleural effusion with complete collapse of the left lower lobe. 2. Trace right pleural effusion with right lower lobe subsegmental atelectasis. 3. Mild mosaic attenuation throughout the lungs could reflect chronic small airways or small vessel disease. 4. Prominent bilateral axillary, left supraclavicular, and mediastinal lymph nodes measuring up to 10 mm in short axis are overall similar to prior study and likely reflect patient's history of chronic lymphocytic leukemia. 5.  Aortic atherosclerosis (ICD10-I70.0). Electronically Signed   By: Titus Dubin M.D.   On: 11/23/2017 18:00   Dg Swallowing Func-speech Pathology  Result Date: 11/23/2017 Objective Swallowing Evaluation: Type of Study: MBS-Modified Barium Swallow Study  Patient Details Name: JERZY ROEPKE MRN: [id number] Date of Birth: July 26, 1932 Today's  Date: 11/23/2017 Time: SLP Start Time (ACUTE ONLY): 6803 -SLP Stop Time (ACUTE ONLY): 1637 SLP Time Calculation (min) (ACUTE ONLY): 53 min Past Medical History: Past Medical History: Diagnosis Date . Asthma 1950's  history of . Atrial fibrillation ([place])  . Bilateral carpal tunnel syndrome  . Bilateral lower extremity edema  .  Bladder tumor  . Chronic systolic heart failure (HCC)   Echo 1/19: Mild LVH, EF 45-50, inf HK, MAC, severe LAE, severe RAE // Echo 7/15: Mild LVH, mod focal basal sept hypertrophy, EF 55-60, AV peak and mean 16/9, trivial MR, mod LAE, PASP 38 . CLL (chronic lymphocytic leukemia) [facility]) oncologist-  dr Ilene Qua--  dx [phone] ;  Lymphocytosis, CLL - per lov note 05-11-2017 currently under active survillance,  CT 04-17-2014 show very small lymphadenopathy, no indication for treatment . Coronary artery disease   cardiologist-  dr Cathie Olden--  08-18-2017 Intermittant risk nuclear study w/ large area of inferior infartion with no evidence ishcemia  . Deafness in right ear  . Diabetes mellitus type 2, noninsulin dependent ([place])  . Elevated PSA   since prostatectomy but now resolved . Hematuria 04/2017 . History of ear infection   Right . History of MI (myocardial infarction)   per myoview nuclear study 08-18-2017 , unknown when . History of shingles 08/2017  L ear and scalp, possible . Hyperlipidemia  . Hypertension  . Ischemic cardiomyopathy 09/01/2017  Presumed +CAD with Nuclear stress test 08/18/17 - Inferior scar, no ischemia, intermediate risk // med management unless +angina or worse dyspnea . OA (osteoarthritis)  . Pacemaker 02/08/2014  followed by dr g. taylor--  single chamber Biotronik due to SSS . Permanent atrial fibrillation ([place])  . Pneumonia 2019  Left lung . Prostate cancer [facility]) urologist-  dr Diona Fanti  dx 2004--  Gleason 8, PSA 10.45--  11-28-2002  s/p  radical prostatectomy;  recurrent w/ increasing PSA, started ADT treatment . RBBB (right bundle branch block)  . Sick sinus syndrome ([place])    a-Flutter with episodes of bradycardia; S/P Biotronik (serial number [id number]) 02-08-2014 . Urinary incontinence  . Wears hearing aid in right ear   receiver and transmitter Past Surgical History: Past Surgical History: Procedure Laterality Date . APPENDECTOMY   . BACK SURGERY    disk . CARDIAC CATHETERIZATION  09-03-1999  dr Cathie Olden  abnormal cardiolite study:  minor luminal irregularities but no critial coronary artery stenosis . CARDIOVASCULAR STRESS TEST  08-18-2017  dr Cathie Olden  Intermediate risk nuclear study w/ large area inferior infarction, no evidence of ishcemia (consistant w/ prior MI)/  study not gated due to frequent PVCs . CARPAL TUNNEL RELEASE Right 2000 . CARPAL TUNNEL RELEASE Left 11/19/2009 . CATARACT EXTRACTION Right 07/2015 . CATARACT EXTRACTION Left 09/2015 . CYSTOSCOPY N/A 11/04/2017  Procedure: CYSTOSCOPY AND CAUTERIZATION OF BLADDER;  Surgeon: Franchot Gallo, MD;  Location: [facility];  Service: Urology;  Laterality: N/A; . INSERTION PENILE PROSTHESIS  02-22-2004    dr Mattie Marlin  [facility] . KNEE ARTHROSCOPY Left 07/2010 . PERMANENT PACEMAKER INSERTION N/A 02/08/2014  Procedure: PERMANENT PACEMAKER INSERTION;  Surgeon: Evans Lance, MD;  Location: [facility] CATH LAB;  Service: Cardiovascular;  Laterality: N/A; . RADICAL RETROPUBIC PROSTATECTOMY W/ BILATERAL PELVIC LYMPH NODE DISSECTION  11-28-2002   dr Mattie Marlin  [facility] . TONSILLECTOMY   . TOTAL HIP ARTHROPLASTY Left 05/12/2016  Procedure: LEFT TOTAL HIP ARTHROPLASTY ANTERIOR APPROACH;  Surgeon: Paralee Cancel, MD;  Location: [facility];  Service: Orthopedics;  Laterality: Left; . TOTAL HIP ARTHROPLASTY Right 07-15-2006   dr Alvan Dame  [facility] . TRANSTHORACIC ECHOCARDIOGRAM  07/20/2017  mild LVH, ef 45-50%, hypokinesis of the basal-midinferior myocardium, due to AFib unable to evaluate diastolic function/  severe LAE and RAE/  trivial PR and TR HPI: Pt is an 84 year-old male with a history of CLL, prostate cancer, sick sinus syndrome,  pacemaker,  paroxysmal atrial fibrillation, CHF with ejection fraction 45% admitted with LLL PNA. Per consult note from critical care, pt/wife describe cyclic cough for over a year, with coughing sometimes escalating during meals, especially with dry foods.  Subjective: alert, denies swallowing difficulty Assessment / Plan / Recommendation CHL IP CLINICAL IMPRESSIONS 11/23/2017 Clinical Impression Pt has a mild oropharyngeal dysphagia with structural component due to suspected osteophytes most prominent at C3-4, C4-5 that impede full epiglottic deflection and airway closure. What further impacts his ability to protect his airway is his impulsive intake (large boluses and fast rate) and premature spillage. Thin and nectar thick liquids spill into the pharynx, filling the valleculae and spilling onward to the pyriform sinuses before the swallow. Then, as he swallows, the thin liquids spills into the partially open airway, allowing for trace amounts of penetration and aspiration. Pt had significant baseline coughing that made it very challenging to determine whether coughing was related to aspiration or not. SLP provided Mod cues for attempts at a chin tuck, oral holding, and smaller bolus sizes. Small sips were the most effective at protecting the airway with thin liquids although I suspect if he could have performed the oral hold that this would further increase his safety with swallowing. Unfortunately, pt could not consistently utilize these strategies. No airway compromise occurred with thicker liquids or solids, although pt did have to take intermittent pauses during oral prep with solids to catch his breath. Although pt's dysphagia is mild, considering his impulsivity as well as his recurrent PNA and chronic cough, would favor starting a more conservative diet at this time: Dys 3 diet and nectar thick liquids. SLP will f/u to facilitate hopeful transition back to thin liquids with improved ability to take small sips, contain  liquids orally, and increase oral care/use of aspiration precautions.  SLP Visit Diagnosis Dysphagia, oropharyngeal phase (R13.12) Attention and concentration deficit following -- Frontal lobe and executive function deficit following -- Impact on safety and function Mild aspiration risk   CHL IP TREATMENT RECOMMENDATION 11/23/2017 Treatment Recommendations Therapy as outlined in treatment plan below   Prognosis 11/23/2017 Prognosis for Safe Diet Advancement Good Barriers to Reach Goals -- Barriers/Prognosis Comment -- CHL IP DIET RECOMMENDATION 11/23/2017 SLP Diet Recommendations Dysphagia 3 (Mech soft) solids;Nectar thick liquid Liquid Administration via Cup Medication Administration Whole meds with puree Compensations Slow rate;Small sips/bites Postural Changes Seated upright at 90 degrees   CHL IP OTHER RECOMMENDATIONS 11/23/2017 Recommended Consults -- Oral Care Recommendations Oral care BID Other Recommendations Order thickener from pharmacy;Prohibited food (jello, ice cream, thin soups);Remove water pitcher   CHL IP FOLLOW UP RECOMMENDATIONS 11/23/2017 Follow up Recommendations Outpatient SLP;Home health SLP   CHL IP FREQUENCY AND DURATION 11/23/2017 Speech Therapy Frequency (ACUTE ONLY) min 2x/week Treatment Duration 2 weeks      CHL IP ORAL PHASE 11/23/2017 Oral Phase Impaired Oral - Pudding Teaspoon -- Oral - Pudding Cup -- Oral - Honey Teaspoon -- Oral - Honey Cup -- Oral - Nectar Teaspoon -- Oral - Nectar Cup Decreased bolus cohesion;Premature spillage Oral - Nectar Straw -- Oral - Thin Teaspoon -- Oral - Thin Cup Decreased bolus cohesion;Premature spillage Oral - Thin Straw Decreased bolus cohesion;Premature spillage Oral - Puree Piecemeal swallowing Oral - Mech Soft Delayed oral transit Oral - Regular -- Oral - Multi-Consistency -- Oral - Pill -- Oral Phase - Comment --  CHL IP PHARYNGEAL PHASE 11/23/2017 Pharyngeal Phase Impaired Pharyngeal- Pudding Teaspoon -- Pharyngeal -- Pharyngeal- Pudding Cup -- Pharyngeal --  Pharyngeal-  Honey Teaspoon -- Pharyngeal -- Pharyngeal- Honey Cup -- Pharyngeal -- Pharyngeal- Nectar Teaspoon -- Pharyngeal -- Pharyngeal- Nectar Cup Reduced epiglottic inversion;Reduced airway/laryngeal closure;Delayed swallow initiation-pyriform sinuses Pharyngeal -- Pharyngeal- Nectar Straw -- Pharyngeal -- Pharyngeal- Thin Teaspoon -- Pharyngeal -- Pharyngeal- Thin Cup Reduced epiglottic inversion;Reduced airway/laryngeal closure;Delayed swallow initiation-pyriform sinuses;Penetration/Aspiration during swallow Pharyngeal Material enters airway, CONTACTS cords and not ejected out Pharyngeal- Thin Straw Reduced epiglottic inversion;Reduced airway/laryngeal closure;Delayed swallow initiation-pyriform sinuses;Penetration/Aspiration during swallow Pharyngeal Material enters airway, passes BELOW cords without attempt by patient to eject out (silent aspiration) Pharyngeal- Puree Reduced epiglottic inversion;Reduced airway/laryngeal closure Pharyngeal -- Pharyngeal- Mechanical Soft Reduced epiglottic inversion;Reduced airway/laryngeal closure Pharyngeal -- Pharyngeal- Regular -- Pharyngeal -- Pharyngeal- Multi-consistency -- Pharyngeal -- Pharyngeal- Pill -- Pharyngeal -- Pharyngeal Comment --  CHL IP CERVICAL ESOPHAGEAL PHASE 11/23/2017 Cervical Esophageal Phase WFL Pudding Teaspoon -- Pudding Cup -- Honey Teaspoon -- Honey Cup -- Nectar Teaspoon -- Nectar Cup -- Nectar Straw -- Thin Teaspoon -- Thin Cup -- Thin Straw -- Puree -- Mechanical Soft -- Regular -- Multi-consistency -- Pill -- Cervical Esophageal Comment -- No flowsheet data found. [name] 11/23/2017, 5:04 PM  [name], M.A. CCC-SLP [phone]                  Scheduled Meds: . atorvastatin  20 mg Oral q1800  . carvedilol  3.125 mg Oral BID  . fenofibrate  54 mg Oral Daily  . furosemide  40 mg Intravenous BID  . guaiFENesin  600 mg Oral BID  . insulin aspart  0-5 Units Subcutaneous QHS  . insulin aspart  0-9 Units Subcutaneous  TID WC  . insulin aspart  10 Units Subcutaneous Once  . insulin aspart  4 Units Subcutaneous TID WC  . insulin glargine  15 Units Subcutaneous QHS  . ipratropium-albuterol  3 mL Nebulization TID  . mouth rinse  15 mL Mouth Rinse BID  . methylPREDNISolone (SOLU-MEDROL) injection  40 mg Intravenous Q12H  . Warfarin - Pharmacist Dosing Inpatient   Does not apply q1800   Continuous Infusions: . cefTRIAXone (ROCEPHIN)  IV Stopped (11/23/17 1930)     LOS: 4 days    Time spent: 73    Santiana Glidden A, MD [facility] Pager [phone]  If 7PM-7AM, please contact night-coverage [email or web address] Password [facility] 11/24/2017, 9:48 AM

## 2017-11-24 NOTE — Progress Notes (Signed)
  Speech Language Pathology Treatment: Dysphagia  Patient Details Name: Oscar Baker MRN: [id number] DOB: 05/19/1933 Today's Date: 11/24/2017 Time: 7782-4235 SLP Time Calculation (min) (ACUTE ONLY): 32 min  Assessment / Plan / Recommendation Clinical Impression  SLP provided skilled observation during consumption of nectar thick liquids with a delayed throat clear noted. Min cues were provided for smaller sip size. Today's session also focused heavily on education with the pt and his wife regarding MBS results/recommendations, current diet/liquid textures, aspiration precautions, the importance of oral care, and the risks of aspiration. His menu was reviewed with him to identify foods that are appropriate with current restrictions. All questions were answered at this time. Will continue to follow for tolerance and advanced trials of thin liquids.   HPI HPI: Pt is an 84 year-old male with a history of CLL, prostate cancer, sick sinus syndrome, pacemaker, paroxysmal atrial fibrillation, CHF with ejection fraction 45% admitted with LLL PNA. Per consult note from critical care, pt/wife describe cyclic cough for over a year, with coughing sometimes escalating during meals, especially with dry foods.      SLP Plan  Continue with current plan of care       Recommendations  Diet recommendations: Dysphagia 3 (mechanical soft);Nectar-thick liquid Liquids provided via: Cup Medication Administration: Whole meds with puree Supervision: Patient able to self feed;Intermittent supervision to cue for compensatory strategies Compensations: Slow rate;Small sips/bites Postural Changes and/or Swallow Maneuvers: Seated upright 90 degrees                Oral Care Recommendations: Oral care QID Follow up Recommendations: Outpatient SLP;Home health SLP SLP Visit Diagnosis: Dysphagia, oropharyngeal phase (R13.12) Plan: Continue with current plan of care       GO                Oscar Baker 11/24/2017, 11:23 AM  Oscar Baker, M.A. CCC-SLP [phone]

## 2017-11-24 NOTE — Progress Notes (Signed)
[facility] for Warfarin - hold Indication: atrial fibrillation  Allergies  Allergen Reactions  . Altace [Ramipril] Other (See Comments)    "throat felt like had a knot in it"  . Codeine Nausea And Vomiting    Nausea and vomiting   . Simvastatin Other (See Comments)    Leg aches    Patient Measurements: Height: 5\' 10"  (177.8 cm) Weight: 138 lb 12.8 oz (63 kg) IBW/kg (Calculated) : 73  Vital Signs: Temp: 98 F (36.7 C) (06/05 0413) Temp Source: Oral (06/05 0413) BP: 122/70 (06/05 0756) Pulse Rate: 61 (06/05 0756)  Labs: Recent Labs    11/22/17 0241 11/23/17 0218 11/24/17 0306  HGB 10.1* 10.8* 9.9*  HCT 30.3* 33.1* 29.5*  PLT 381 455* 463*  LABPROT 31.3* 35.9* 36.1*  INR 3.05 3.64 3.66  CREATININE 0.82 0.94 0.96    Estimated Creatinine Clearance: 52 mL/min (by C-G formula based on SCr of 0.96 mg/dL).  Assessment: 84 y.o. male admitted on 11/20/2017 with pneumonia. on warfarin PTA for AFib- home dose is 5mg  daily except 7.5mg  on Saturdays, admit INR 2.01  INR = 3.66 today despite holding warfarin  Planning thoracentesis when INR = 1.5 to 2  Goal of Therapy:  INR 2-3 Monitor platelets by anticoagulation protocol: Yes   Plan:  Hold warfarin Daily INR  Thank you Anette Guarneri, PharmD [phone]  11/24/2017 10:11 AM

## 2017-11-25 ENCOUNTER — Inpatient Hospital Stay (HOSPITAL_COMMUNITY): Payer: Medicare Other

## 2017-11-25 DIAGNOSIS — R0902 Hypoxemia: Secondary | ICD-10-CM

## 2017-11-25 LAB — BLOOD CULTURE ID PANEL (REFLEXED)
Acinetobacter baumannii: NOT DETECTED
CANDIDA PARAPSILOSIS: NOT DETECTED
Candida albicans: NOT DETECTED
Candida glabrata: NOT DETECTED
Candida krusei: NOT DETECTED
Candida tropicalis: NOT DETECTED
ENTEROBACTER CLOACAE COMPLEX: NOT DETECTED
ENTEROBACTERIACEAE SPECIES: NOT DETECTED
Enterococcus species: NOT DETECTED
Escherichia coli: NOT DETECTED
Haemophilus influenzae: NOT DETECTED
KLEBSIELLA PNEUMONIAE: NOT DETECTED
Klebsiella oxytoca: NOT DETECTED
LISTERIA MONOCYTOGENES: NOT DETECTED
METHICILLIN RESISTANCE: DETECTED — AB
NEISSERIA MENINGITIDIS: NOT DETECTED
PROTEUS SPECIES: NOT DETECTED
PSEUDOMONAS AERUGINOSA: NOT DETECTED
STAPHYLOCOCCUS AUREUS BCID: NOT DETECTED
STREPTOCOCCUS PNEUMONIAE: NOT DETECTED
Serratia marcescens: NOT DETECTED
Staphylococcus species: DETECTED — AB
Streptococcus agalactiae: NOT DETECTED
Streptococcus pyogenes: NOT DETECTED
Streptococcus species: NOT DETECTED

## 2017-11-25 LAB — PROTIME-INR
INR: 2.07
PROTHROMBIN TIME: 23.1 s — AB (ref 11.4–15.2)

## 2017-11-25 LAB — BASIC METABOLIC PANEL
Anion gap: 9 (ref 5–15)
BUN: 31 mg/dL — AB (ref 6–20)
CHLORIDE: 97 mmol/L — AB (ref 101–111)
CO2: 29 mmol/L (ref 22–32)
CREATININE: 0.97 mg/dL (ref 0.61–1.24)
Calcium: 8.7 mg/dL — ABNORMAL LOW (ref 8.9–10.3)
GFR calc Af Amer: >60 mL/min (ref >60)
GFR calc non Af Amer: >60 mL/min (ref >60)
GLUCOSE: 297 mg/dL — AB (ref 65–99)
POTASSIUM: 4.6 mmol/L (ref 3.5–5.1)
Sodium: 135 mmol/L (ref 135–145)

## 2017-11-25 LAB — GLUCOSE, CAPILLARY
GLUCOSE-CAPILLARY: 307 mg/dL — AB (ref 65–99)
Glucose-Capillary: 318 mg/dL — ABNORMAL HIGH (ref 65–99)
Glucose-Capillary: 227 mg/dL — ABNORMAL HIGH (ref 65–99)
Glucose-Capillary: 267 mg/dL — ABNORMAL HIGH (ref 65–99)

## 2017-11-25 LAB — CBC WITH DIFFERENTIAL/PLATELET
BASOS PCT: 0 %
Basophils Absolute: 0 10*3/uL (ref 0.0–0.1)
EOS ABS: 0 10*3/uL (ref 0.0–0.7)
EOS PCT: 0 %
HEMATOCRIT: 27.4 % — AB (ref 39.0–52.0)
HEMOGLOBIN: 9.4 g/dL — AB (ref 13.0–17.0)
LYMPHS PCT: 52 %
Lymphs Abs: 22.7 10*3/uL — ABNORMAL HIGH (ref 0.7–4.0)
MCH: 32.5 pg (ref 26.0–34.0)
MCHC: 34.3 g/dL (ref 30.0–36.0)
MCV: 94.8 fL (ref 78.0–100.0)
MONOS PCT: 3 %
Monocytes Absolute: 1.3 10*3/uL — ABNORMAL HIGH (ref 0.1–1.0)
NEUTROS ABS: 19.6 10*3/uL — AB (ref 1.7–7.7)
Neutrophils Relative %: 45 %
Platelets: 507 10*3/uL — ABNORMAL HIGH (ref 150–400)
RBC: 2.89 MIL/uL — ABNORMAL LOW (ref 4.22–5.81)
RDW: 18.2 % — ABNORMAL HIGH (ref 11.5–15.5)
WBC: 43.6 10*3/uL — ABNORMAL HIGH (ref 4.0–10.5)

## 2017-11-25 LAB — GRAM STAIN

## 2017-11-25 LAB — PROTEIN, TOTAL: TOTAL PROTEIN: 5.9 g/dL — AB (ref 6.5–8.1)

## 2017-11-25 LAB — LACTATE DEHYDROGENASE: LDH: 120 U/L (ref 98–192)

## 2017-11-25 MED ORDER — INSULIN ASPART 100 UNIT/ML ~~LOC~~ SOLN
10.0000 [IU] | Freq: Once | SUBCUTANEOUS | Status: AC
  Administered 2017-11-25: 10 [IU] via SUBCUTANEOUS

## 2017-11-25 NOTE — Progress Notes (Signed)
[facility] for Warfarin - hold Indication: atrial fibrillation  Allergies  Allergen Reactions  . Altace [Ramipril] Other (See Comments)    "throat felt like had a knot in it"  . Codeine Nausea And Vomiting    Nausea and vomiting   . Simvastatin Other (See Comments)    Leg aches    Patient Measurements: Height: 5\' 10"  (177.8 cm) Weight: 138 lb 12.8 oz (63 kg) IBW/kg (Calculated) : 73  Vital Signs: Temp: 97.6 F (36.4 C) (06/06 0759) Temp Source: Oral (06/06 0759) BP: 109/64 (06/06 0759) Pulse Rate: 74 (06/06 0759)  Labs: Recent Labs    11/23/17 0218 11/24/17 0306 11/25/17 0532  HGB 10.8* 9.9* 9.4*  HCT 33.1* 29.5* 27.4*  PLT 455* 463* 507*  LABPROT 35.9* 36.1* 23.1*  INR 3.64 3.66 2.07  CREATININE 0.94 0.96 0.97    Estimated Creatinine Clearance: 51.4 mL/min (by C-G formula based on SCr of 0.97 mg/dL).  Assessment: 84 y.o. male admitted on 11/20/2017 with pneumonia. on warfarin PTA for AFib- home dose is 5mg  daily except 7.5mg  on Saturdays, admit INR 2.01  INR = 2.07 after vitamin K Planning thoracentesis when INR = 1.5 to 2  Goal of Therapy:  INR 2-3 Monitor platelets by anticoagulation protocol: Yes   Plan:  Hold warfarin Daily INR  Thank you Anette Guarneri, PharmD [phone]  11/25/2017 8:07 AM

## 2017-11-25 NOTE — Progress Notes (Signed)
Name: Oscar Baker MRN: [id number] DOB: 12/13/1932    ADMISSION DATE:  11/20/2017 CONSULTATION DATE: 11/23/2017  REFERRING MD : [facility]  CHIEF COMPLAINT: Chronic cough, green sputum production for over a year  BRIEF PATIENT DESCRIPTION: 84 y/o M admitted 6/1 with chronic cough, green sputum production.  Prior pulmonary work up for cough by Dr. Melvyn Novas included recommendation for allergy follow up (pt has not yet completed).  Concern for aspiration with eating > SLP evaluation demonstrated aspiration.  Found to have a left pleural effusion, PCCM consulted 6/4 for evaluation.  06/2017 ECHO demonstrated an EF of 45-50%, LVH, hypokinesis of basilar mid inferior myocardium. Pt reported ongoing green sputum production despite being on antibiotics.His cough is very hoarse and explosive.  His wife notes that his coughing does escalate during times of eating.  She also she further notes he is been coughing like this for over a year.   PMH:  Prostate cancer s/p prostatectomy / radiation and hormone therapy followed by Dr. Diona Fanti, Dr. Alen Blew.  CLL with no treatment, monitored by ONC.  PAF on coumadin, pacemaker, DM, Anemia, HTN.   SUBJECTIVE:  Pt reports completing swallow eval with concern for aspiration.  Wife at bedside.    VITAL SIGNS: Temp:  [97.2 F (36.2 C)-99.4 F (37.4 C)] 97.5 F (36.4 C) (06/06 1201) Pulse Rate:  [65-74] 65 (06/06 1201) Resp:  [16-26] 26 (06/06 1201) BP: (109-123)/(64-91) 120/91 (06/06 1201) SpO2:  [92 %-96 %] 96 % (06/06 1449)  PHYSICAL EXAMINATION: General: elderly male in NAD, sitting up in chair  HEENT: MM pink/moist, good dentition, HOH / bilateral hearing aides Neuro: AAOx4, speech clear, MAE  CV: s1s2 rrr, no m/r/g PULM: even/non-labored, clear on right, diminished on L AS:TMHD, non-tender, bsx4 active  Extremities: warm/dry, BLE 1+ edema  Skin: no rashes or lesions  Recent Labs  Lab 11/23/17 0218 11/24/17 0306 11/24/17 0930 11/25/17 0532  NA 131*  132*  --  135  K 4.6 4.8  --  4.6  CL 98* 96*  --  97*  CO2 24 28  --  29  BUN 19 22*  --  31*  CREATININE 0.94 0.96  --  0.97  GLUCOSE 226* 350* 508* 297*   Recent Labs  Lab 11/23/17 0218 11/24/17 0306 11/25/17 0532  HGB 10.8* 9.9* 9.4*  HCT 33.1* 29.5* 27.4*  WBC 60.0* 46.7* 43.6*  PLT 455* 463* 507*   Ct Chest Wo Contrast  Result Date: 11/23/2017 CLINICAL DATA:  Left pleural effusion. EXAM: CT CHEST WITHOUT CONTRAST TECHNIQUE: Multidetector CT imaging of the chest was performed following the standard protocol without IV contrast. COMPARISON:  Chest x-ray from same day. CT chest dated April 17, 2014. FINDINGS: Cardiovascular: Mild cardiomegaly with unchanged left chest wall pacemaker. No pericardial effusion. Normal caliber thoracic aorta. Coronary, aortic arch, and branch vessel atherosclerotic vascular disease. Mediastinum/Nodes: Multiple prominent bilateral axillary, left supraclavicular, and mediastinal lymph nodes measuring up to 10 mm in short axis are overall similar to prior study. The thyroid gland trachea, and esophagus demonstrate no significant findings. Lungs/Pleura: Moderate to large left pleural effusion. Trace right pleural effusion. Complete collapse of the left lower lobe. Areas of mucous impaction in the right lower lobe with subsegmental atelectasis. Mild diffuse peribronchial thickening. No consolidation or pneumothorax. No suspicious pulmonary nodule. Mild mosaic attenuation throughout the lungs. Upper Abdomen: No acute abnormality. Musculoskeletal: No chest wall abnormality. No acute or significant osseous findings. Degenerative changes of the thoracic spine. IMPRESSION: 1. Moderate to large left  pleural effusion with complete collapse of the left lower lobe. 2. Trace right pleural effusion with right lower lobe subsegmental atelectasis. 3. Mild mosaic attenuation throughout the lungs could reflect chronic small airways or small vessel disease. 4. Prominent bilateral  axillary, left supraclavicular, and mediastinal lymph nodes measuring up to 10 mm in short axis are overall similar to prior study and likely reflect patient's history of chronic lymphocytic leukemia. 5.  Aortic atherosclerosis (ICD10-I70.0). Electronically Signed   By: Titus Dubin M.D.   On: 11/23/2017 18:00   Dg Swallowing Func-speech Pathology  Result Date: 11/23/2017 Objective Swallowing Evaluation: Type of Study: MBS-Modified Barium Swallow Study  Patient Details Name: Oscar Baker MRN: [id number] Date of Birth: June 11, 1933 Today's Date: 11/23/2017 Time: SLP Start Time (ACUTE ONLY): 5993 -SLP Stop Time (ACUTE ONLY): 1637 SLP Time Calculation (min) (ACUTE ONLY): 53 min Past Medical History: Past Medical History: Diagnosis Date . Asthma 1950's  history of . Atrial fibrillation ([place])  . Bilateral carpal tunnel syndrome  . Bilateral lower extremity edema  . Bladder tumor  . Chronic systolic heart failure (HCC)   Echo 1/19: Mild LVH, EF 45-50, inf HK, MAC, severe LAE, severe RAE // Echo 7/15: Mild LVH, mod focal basal sept hypertrophy, EF 55-60, AV peak and mean 16/9, trivial MR, mod LAE, PASP 38 . CLL (chronic lymphocytic leukemia) [facility]) oncologist-  dr Ilene Qua--  dx [phone] ;  Lymphocytosis, CLL - per lov note 05-11-2017 currently under active survillance,  CT 04-17-2014 show very small lymphadenopathy, no indication for treatment . Coronary artery disease   cardiologist-  dr Cathie Olden--  08-18-2017 Intermittant risk nuclear study w/ large area of inferior infartion with no evidence ishcemia  . Deafness in right ear  . Diabetes mellitus type 2, noninsulin dependent ([place])  . Elevated PSA   since prostatectomy but now resolved . Hematuria 04/2017 . History of ear infection   Right . History of MI (myocardial infarction)   per myoview nuclear study 08-18-2017 , unknown when . History of shingles 08/2017  L ear and scalp, possible . Hyperlipidemia  . Hypertension  . Ischemic cardiomyopathy 09/01/2017  Presumed +CAD  with Nuclear stress test 08/18/17 - Inferior scar, no ischemia, intermediate risk // med management unless +angina or worse dyspnea . OA (osteoarthritis)  . Pacemaker 02/08/2014  followed by dr g. taylor--  single chamber Biotronik due to SSS . Permanent atrial fibrillation ([place])  . Pneumonia 2019  Left lung . Prostate cancer [facility]) urologist-  dr Diona Fanti  dx 2004--  Gleason 8, PSA 10.45--  11-28-2002  s/p  radical prostatectomy;  recurrent w/ increasing PSA, started ADT treatment . RBBB (right bundle branch block)  . Sick sinus syndrome ([place])   a-Flutter with episodes of bradycardia; S/P Biotronik (serial number [id number]) 02-08-2014 . Urinary incontinence  . Wears hearing aid in right ear   receiver and transmitter Past Surgical History: Past Surgical History: Procedure Laterality Date . APPENDECTOMY   . BACK SURGERY    disk . CARDIAC CATHETERIZATION  09-03-1999  dr Cathie Olden  abnormal cardiolite study:  minor luminal irregularities but no critial coronary artery stenosis . CARDIOVASCULAR STRESS TEST  08-18-2017  dr Cathie Olden  Intermediate risk nuclear study w/ large area inferior infarction, no evidence of ishcemia (consistant w/ prior MI)/  study not gated due to frequent PVCs . CARPAL TUNNEL RELEASE Right 2000 . CARPAL TUNNEL RELEASE Left 11/19/2009 . CATARACT EXTRACTION Right 07/2015 . CATARACT EXTRACTION Left 09/2015 . CYSTOSCOPY N/A 11/04/2017  Procedure: CYSTOSCOPY AND CAUTERIZATION OF  BLADDER;  Surgeon: Franchot Gallo, MD;  Location: [facility];  Service: Urology;  Laterality: N/A; . INSERTION PENILE PROSTHESIS  02-22-2004    dr Mattie Marlin  [facility] . KNEE ARTHROSCOPY Left 07/2010 . PERMANENT PACEMAKER INSERTION N/A 02/08/2014  Procedure: PERMANENT PACEMAKER INSERTION;  Surgeon: Evans Lance, MD;  Location: [facility] CATH LAB;  Service: Cardiovascular;  Laterality: N/A; . RADICAL RETROPUBIC PROSTATECTOMY W/ BILATERAL PELVIC LYMPH NODE DISSECTION  11-28-2002   dr Mattie Marlin  [facility] . TONSILLECTOMY   . TOTAL HIP  ARTHROPLASTY Left 05/12/2016  Procedure: LEFT TOTAL HIP ARTHROPLASTY ANTERIOR APPROACH;  Surgeon: Paralee Cancel, MD;  Location: [facility] ORS;  Service: Orthopedics;  Laterality: Left; . TOTAL HIP ARTHROPLASTY Right 07-15-2006   dr Alvan Dame  [facility] . TRANSTHORACIC ECHOCARDIOGRAM  07/20/2017  mild LVH, ef 45-50%, hypokinesis of the basal-midinferior myocardium, due to AFib unable to evaluate diastolic function/  severe LAE and RAE/  trivial PR and TR HPI: Pt is an 84 year-old male with a history of CLL, prostate cancer, sick sinus syndrome, pacemaker, paroxysmal atrial fibrillation, CHF with ejection fraction 45% admitted with LLL PNA. Per consult note from critical care, pt/wife describe cyclic cough for over a year, with coughing sometimes escalating during meals, especially with dry foods.  Subjective: alert, denies swallowing difficulty Assessment / Plan / Recommendation CHL IP CLINICAL IMPRESSIONS 11/23/2017 Clinical Impression Pt has a mild oropharyngeal dysphagia with structural component due to suspected osteophytes most prominent at C3-4, C4-5 that impede full epiglottic deflection and airway closure. What further impacts his ability to protect his airway is his impulsive intake (large boluses and fast rate) and premature spillage. Thin and nectar thick liquids spill into the pharynx, filling the valleculae and spilling onward to the pyriform sinuses before the swallow. Then, as he swallows, the thin liquids spills into the partially open airway, allowing for trace amounts of penetration and aspiration. Pt had significant baseline coughing that made it very challenging to determine whether coughing was related to aspiration or not. SLP provided Mod cues for attempts at a chin tuck, oral holding, and smaller bolus sizes. Small sips were the most effective at protecting the airway with thin liquids although I suspect if he could have performed the oral hold that this would further increase his safety with swallowing.  Unfortunately, pt could not consistently utilize these strategies. No airway compromise occurred with thicker liquids or solids, although pt did have to take intermittent pauses during oral prep with solids to catch his breath. Although pt's dysphagia is mild, considering his impulsivity as well as his recurrent PNA and chronic cough, would favor starting a more conservative diet at this time: Dys 3 diet and nectar thick liquids. SLP will f/u to facilitate hopeful transition back to thin liquids with improved ability to take small sips, contain liquids orally, and increase oral care/use of aspiration precautions.  SLP Visit Diagnosis Dysphagia, oropharyngeal phase (R13.12) Attention and concentration deficit following -- Frontal lobe and executive function deficit following -- Impact on safety and function Mild aspiration risk   CHL IP TREATMENT RECOMMENDATION 11/23/2017 Treatment Recommendations Therapy as outlined in treatment plan below   Prognosis 11/23/2017 Prognosis for Safe Diet Advancement Good Barriers to Reach Goals -- Barriers/Prognosis Comment -- CHL IP DIET RECOMMENDATION 11/23/2017 SLP Diet Recommendations Dysphagia 3 (Mech soft) solids;Nectar thick liquid Liquid Administration via Cup Medication Administration Whole meds with puree Compensations Slow rate;Small sips/bites Postural Changes Seated upright at 90 degrees   CHL IP OTHER RECOMMENDATIONS 11/23/2017 Recommended Consults --  Oral Care Recommendations Oral care BID Other Recommendations Order thickener from pharmacy;Prohibited food (jello, ice cream, thin soups);Remove water pitcher   CHL IP FOLLOW UP RECOMMENDATIONS 11/23/2017 Follow up Recommendations Outpatient SLP;Home health SLP   CHL IP FREQUENCY AND DURATION 11/23/2017 Speech Therapy Frequency (ACUTE ONLY) min 2x/week Treatment Duration 2 weeks      CHL IP ORAL PHASE 11/23/2017 Oral Phase Impaired Oral - Pudding Teaspoon -- Oral - Pudding Cup -- Oral - Honey Teaspoon -- Oral - Honey Cup -- Oral -  Nectar Teaspoon -- Oral - Nectar Cup Decreased bolus cohesion;Premature spillage Oral - Nectar Straw -- Oral - Thin Teaspoon -- Oral - Thin Cup Decreased bolus cohesion;Premature spillage Oral - Thin Straw Decreased bolus cohesion;Premature spillage Oral - Puree Piecemeal swallowing Oral - Mech Soft Delayed oral transit Oral - Regular -- Oral - Multi-Consistency -- Oral - Pill -- Oral Phase - Comment --  CHL IP PHARYNGEAL PHASE 11/23/2017 Pharyngeal Phase Impaired Pharyngeal- Pudding Teaspoon -- Pharyngeal -- Pharyngeal- Pudding Cup -- Pharyngeal -- Pharyngeal- Honey Teaspoon -- Pharyngeal -- Pharyngeal- Honey Cup -- Pharyngeal -- Pharyngeal- Nectar Teaspoon -- Pharyngeal -- Pharyngeal- Nectar Cup Reduced epiglottic inversion;Reduced airway/laryngeal closure;Delayed swallow initiation-pyriform sinuses Pharyngeal -- Pharyngeal- Nectar Straw -- Pharyngeal -- Pharyngeal- Thin Teaspoon -- Pharyngeal -- Pharyngeal- Thin Cup Reduced epiglottic inversion;Reduced airway/laryngeal closure;Delayed swallow initiation-pyriform sinuses;Penetration/Aspiration during swallow Pharyngeal Material enters airway, CONTACTS cords and not ejected out Pharyngeal- Thin Straw Reduced epiglottic inversion;Reduced airway/laryngeal closure;Delayed swallow initiation-pyriform sinuses;Penetration/Aspiration during swallow Pharyngeal Material enters airway, passes BELOW cords without attempt by patient to eject out (silent aspiration) Pharyngeal- Puree Reduced epiglottic inversion;Reduced airway/laryngeal closure Pharyngeal -- Pharyngeal- Mechanical Soft Reduced epiglottic inversion;Reduced airway/laryngeal closure Pharyngeal -- Pharyngeal- Regular -- Pharyngeal -- Pharyngeal- Multi-consistency -- Pharyngeal -- Pharyngeal- Pill -- Pharyngeal -- Pharyngeal Comment --  CHL IP CERVICAL ESOPHAGEAL PHASE 11/23/2017 Cervical Esophageal Phase WFL Pudding Teaspoon -- Pudding Cup -- Honey Teaspoon -- Honey Cup -- Nectar Teaspoon -- Nectar Cup -- Nectar Straw  -- Thin Teaspoon -- Thin Cup -- Thin Straw -- Puree -- Mechanical Soft -- Regular -- Multi-consistency -- Pill -- Cervical Esophageal Comment -- No flowsheet data found. [name] 11/23/2017, 5:04 PM  [name], M.A. CCC-SLP [phone]             SIGNIFICANT EVENTS  6/01  Admit  6/04  PCCM consulted   STUDIES:  SLP 6/5 >>   ASSESSMENT:  Discussion:  84 y/o M admitted with cough with green sputum production that has been occurring for greater than one year.  Concern for aspiration PNA.  Chronic nature of cough appears to be related to recurrent aspiration events.  LLL infiltrate appears to be chronic but associated effusion is larger than prior exams.   HCAP in setting of recurrent Aspiration  Acute Hypoxemic Respiratory Failure - in setting of aspiration, effusion  Left Pleural Effusion  Laryngopharyngeal Reflux Disease / Dysphagia  Chronic Cough  Allergic Rhinitis  H. Influenzae Bacteremia - sputum negative but suspect from pulmonary source CLL  84 year old male with chronic cough and aspiration.  Returns s/p fall and pleural effusion.  On exam, decreased BS on the left.  I reviewed CXR myself, pleural effusion noted.  Dicussed with PCCM-NP.  Infiltrate concerning for pneumonia  - Abx as ordered  - F/U CXR post thora  Effusion:  - Hold further vitamin K  - INR in AM  - IR to perform thora  LAN: likely reactive  - F/U CT post treatment of  infection  Dysphagia: confirmed  - Dysphagia 3 diet and care while swallowing  Hypoxemia:  - Titrate O2 for sat of 88-92%.  Cough: likely related to aspiration  - Speech pathology  - Care with feedings  PCCM will continue to follow  Rush Farmer, M.D. [facility] Pulmonary/Critical Care Medicine. Pager: [phone]. After hours pager: [phone].

## 2017-11-25 NOTE — Progress Notes (Signed)
  Speech Language Pathology Treatment: Dysphagia  Patient Details Name: Oscar Baker MRN: [id number] DOB: [birth year] Today's Date: 11/25/2017 Time: 4081-4481 SLP Time Calculation (min) (ACUTE ONLY): 36 min  Assessment / Plan / Recommendation Clinical Impression  Thorough education was provided about relationship between respiratory system and swallowing, risks of aspiration, and precautions to reduce that risk. Pt is frustrated with ordering because he reports inconsistencies in what he is allowed to order (particularly with dietary restrictions from heart healthy/carb mod diet). We discussed again foods that he can order as well as how he can prepare foods at home to coincide with a softer diet. He has been requesting tomato juice with meals because it is naturally nectar thick. SLP provided advanced trials of water to practice smaller sip size and oral holding to reduce the risk of aspiration. Pt continues to take large volumes despite Mod cues, and he performed oral holding but with piecemeal swallowing due to large boluses. Instructed pt to use a spoon in order to adjust to what a smaller sip size feels like. Pt self-fed spoonfuls of water, still with intermittent delayed coughing. Given baseline coughing it is difficult to tell if this is related to aspiration, although risk should be low with these smaller volumes. Recommend continuing Dys 3 diet and nectar thick liquids (pt verbalizes that he is agreeable) but allowing ice chips in between meals after oral care. SLP provided education and demonstrations about how to effectively perform oral care. Will continue to follow.   HPI HPI: Pt is an 84 year-old male with a history of CLL, prostate cancer, sick sinus syndrome, pacemaker, paroxysmal atrial fibrillation, CHF with ejection fraction 45% admitted with LLL PNA. Per consult note from critical care, pt/wife describe cyclic cough for over a year, with coughing sometimes escalating during meals,  especially with dry foods.      SLP Plan  Continue with current plan of care       Recommendations  Diet recommendations: Dysphagia 3 (mechanical soft);Nectar-thick liquid;Other(comment)(ice chips btwn meals after oral care) Liquids provided via: Cup Medication Administration: Whole meds with puree Supervision: Patient able to self feed;Intermittent supervision to cue for compensatory strategies Compensations: Slow rate;Small sips/bites Postural Changes and/or Swallow Maneuvers: Seated upright 90 degrees                Oral Care Recommendations: Oral care QID;Oral care prior to ice chip/H20 Follow up Recommendations: Outpatient SLP;Home health SLP SLP Visit Diagnosis: Dysphagia, oropharyngeal phase (R13.12) Plan: Continue with current plan of care       GO                [name] 11/25/2017, 9:35 AM  [name], M.A. CCC-SLP [phone]

## 2017-11-25 NOTE — Procedures (Signed)
Thoracentesis Procedure Note  Pre-operative Diagnosis: Pleural effusion  Post-operative Diagnosis: same  Indications: Pleural effusion  Procedure Details  Consent: Informed consent was obtained. Risks of the procedure were discussed including: infection, bleeding, pain, pneumothorax.  Under sterile conditions the patient was positioned. Betadine solution and sterile drapes were utilized.  1% buffered lidocaine was used to anesthetize the 6 rib space. Fluid was obtained without any difficulties and minimal blood loss.  A dressing was applied to the wound and wound care instructions were provided.   Findings 10 ml of bloody pleural fluid was obtained. A sample was sent to Pathology for cytogenetics, flow, and cell counts, as well as for infection analysis.  Complications:  None; patient tolerated the procedure well.          Condition: stable  Plan A follow up chest x-ray was ordered. Bed Rest for 0 hours. Tylenol 650 mg. for pain.  Attending Attestation: I performed the procedure.  Rush Farmer, M.D. [facility] Pulmonary/Critical Care Medicine. Pager: [phone]. After hours pager: [phone].

## 2017-11-25 NOTE — Plan of Care (Signed)
Discussed plan of care with patient.  Encouraged patient to continue ambulating in the hallway.  Patient wants to contribute towards his improving his health.  Good teach back displayed.

## 2017-11-25 NOTE — Progress Notes (Signed)
Patient ID: Oscar Baker, male   DOB: [birth year], 84 y.o.   MRN: [id number] Patient ID: Oscar Baker, male   DOB: 06-09-1933, 84 y.o.   MRN: [id number]  PROGRESS NOTE    Oscar Baker  BDZ:[id number] DOB: February 15, 1933 DOA: 11/20/2017 PCP: [name], MD     Brief Narrative:   84 year-old male with a history of CLL, prostate cancer, sick sinus syndrome, pacemaker, paroxysmal atrial fibrillation, CHF with ejection fraction 45% admitted with fever increasing shortness of breath cough with yellow to green phlegm for 1 week prior to admission. He was found to have left lower lobe pneumonia with associated effusion and is admitted for the treatment of the same.    Assessment & Plan:   Principal Problem:   HCAP (healthcare-associated pneumonia) Active Problems:   HTN (hypertension)   Hyperlipidemia   Diabetes mellitus type 2, noninsulin dependent (HCC)   LPRD (laryngopharyngeal reflux disease)   Other allergic rhinitis   Anemia   Chronic cough   CLL (chronic lymphocytic leukemia) (HCC)   Pleural effusion   Pneumonia of left lower lobe due to infectious organism [facility])   Mediastinal lymphadenopathy  1]HCAP/H influenza bacteremia-patient improving clinically every day.  Currently on ceftriaxone for H influenza bacteremia. Blood culture came back positive for H. influenzae.  Repeat blood cultures pending. Increasing leukocytosis secondary to CLL.   Steroids were started and are being weaned due to hyperglycemia, pulmonary consulted and are considering thoracentesis for his large pleural effusion once INR goes below 2.  2]paroxysmal atrial fibrillation with CHF continue carvedilol and Coumadin on hold right now possible tap.  Increase the dose of Lasix to 40 mg twice a day for 1 more day.  Then reevaluate and adjust the dose as needed.  Chest x-ray done yesterday and today noted with left sided infiltrates and opacity and pleural effusion. Patient is not on potassium replacement acute remains  normal at 4.  6 follow-up tomorrow.  Echocardiogram with ejection fraction 45 to 50% January 2019.  3]history of hypertension continue carvedilol and Lasix.  4]type 2 diabetes  blood sugar has been elevated.  Increasing sliding scale needs and Lantus 20 to 22 units nightly weaning steroids.  5]hyponatremia-Secondary to pneumonia follow-up levels.   Improving slowly  6) high aspiration risk-patient is not happy about thickening his liquids.  Speech therapy and dietitian consulted.      DVT prophylaxis: SCDs and Coumadin on hold for now code Status: DNR  family Communication: Wife  disposition Plan: Per pulmonary team  Consultants:   Pulmonology  Procedures: Thoracentesis planned   Antimicrobials:   Rocephin   Subjective: Patient feeling much better today he is off oxygen his shortness of breath is improved.  He is not very happy about having to thicken his  liquids.   Objective: Vitals:   11/24/17 2359 11/25/17 0300 11/25/17 0729 11/25/17 0759  BP: 113/66 118/69  109/64  Pulse: 67 65  74  Resp: 16 (!) 26  (!) 24  Temp: 98 F (36.7 C) (!) 97.2 F (36.2 C)  97.6 F (36.4 C)  TempSrc: Oral Oral  Oral  SpO2: 94% 94% 95% 94%  Weight:      Height:        Intake/Output Summary (Last 24 hours) at 11/25/2017 0905 Last data filed at 11/25/2017 0001 Gross per 24 hour  Intake 240 ml  Output 1900 ml  Net -1660 ml   Filed Weights   11/20/17 2100 11/21/17 0537  Weight: 97.1 kg (214  lb 1.1 oz) 63 kg (138 lb 12.8 oz)    Examination:  General exam: Appears calm and comfortable  Respiratory system: Clear to auscultation. Respiratory effort normal.   much improved breath sounds on the left. Cardiovascular system: S1 & S2 heard, RRR. No JVD, murmurs, rubs, gallops or clicks. No pedal edema. Gastrointestinal system: Abdomen is nondistended, soft and nontender. No organomegaly or masses felt. Normal bowel sounds heard. Central nervous system: Alert and oriented. No  focal neurological deficits. Extremities: Symmetric 5 x 5 power. Skin: No rashes, lesions or ulcers Psychiatry: Judgement and insight appear normal. Mood & affect appropriate.     Data Reviewed: I have personally reviewed following labs and imaging studies  CBC: Recent Labs  Lab 11/20/17 1339 11/21/17 0226 11/22/17 0241 11/23/17 0218 11/24/17 0306 11/25/17 0532  WBC 35.5* 40.6* 47.3* 60.0* 46.7* 43.6*  NEUTROABS 12.8*  --   --   --   --  19.6*  HGB 11.0* 10.1* 10.1* 10.8* 9.9* 9.4*  HCT 33.3* 31.0* 30.3* 33.1* 29.5* 27.4*  MCV 99.4 100.0 99.7 98.2 96.1 94.8  PLT 372 369 381 455* 463* 761*   Basic Metabolic Panel: Recent Labs  Lab 11/21/17 0226 11/22/17 0241 11/23/17 0218 11/24/17 0306 11/24/17 0930 11/25/17 0532  NA 137 130* 131* 132*  --  135  K 4.0 4.4 4.6 4.8  --  4.6  CL 105 99* 98* 96*  --  97*  CO2 21* 22 24 28   --  29  GLUCOSE 233* 190* 226* 350* 508* 297*  BUN 16 17 19  22*  --  31*  CREATININE 0.97 0.82 0.94 0.96  --  0.97  CALCIUM 8.4* 8.2* 8.7* 8.6*  --  8.7*  MG  --  1.9  --   --   --   --    GFR: Estimated Creatinine Clearance: 51.4 mL/min (by C-G formula based on SCr of 0.97 mg/dL). Liver Function Tests: Recent Labs  Lab 11/20/17 1339 11/21/17 0226  AST 36 24  ALT 26 21  ALKPHOS 72 62  BILITOT 1.1 1.6*  PROT 6.8 5.7*  ALBUMIN 3.2* 2.6*   No results for input(s): LIPASE, AMYLASE in the last 168 hours. No results for input(s): AMMONIA in the last 168 hours. Coagulation Profile: Recent Labs  Lab 11/21/17 0226 11/22/17 0241 11/23/17 0218 11/24/17 0306 11/25/17 0532  INR 2.10 3.05 3.64 3.66 2.07   Cardiac Enzymes: Recent Labs  Lab 11/20/17 1831  TROPONINI <0.03   BNP (last 3 results) No results for input(s): PROBNP in the last 8760 hours. HbA1C: No results for input(s): HGBA1C in the last 72 hours. CBG: Recent Labs  Lab 11/24/17 0913 11/24/17 1221 11/24/17 1655 11/24/17 2147 11/25/17 0758  GLUCAP 438* 462* 308* 292* 318*     Lipid Profile: No results for input(s): CHOL, HDL, LDLCALC, TRIG, CHOLHDL, LDLDIRECT in the last 72 hours. Thyroid Function Tests: No results for input(s): TSH, T4TOTAL, FREET4, T3FREE, THYROIDAB in the last 72 hours. Anemia Panel: No results for input(s): VITAMINB12, FOLATE, FERRITIN, TIBC, IRON, RETICCTPCT in the last 72 hours. Sepsis Labs: Recent Labs  Lab 11/20/17 1353 11/20/17 1643  LATICACIDVEN 1.38 2.20*    Recent Results (from the past 240 hour(s))  Blood culture (routine x 2)     Status: Abnormal   Collection Time: 11/20/17  4:30 PM  Result Value Ref Range Status   Specimen Description BLOOD RIGHT ANTECUBITAL  Final   Special Requests   Final    BOTTLES DRAWN AEROBIC AND ANAEROBIC  Blood Culture adequate volume   Culture  Setup Time   Final    GRAM NEGATIVE RODS IN BOTH AEROBIC AND ANAEROBIC BOTTLES CRITICAL RESULT CALLED TO, READ BACK BY AND VERIFIED WITH: M MACCIA,PHARMD AT 1547 11/21/17 BY L BENFIELD    Culture (A)  Final    HAEMOPHILUS INFLUENZAE BETA LACTAMASE POSITIVE HEALTH DEPARTMENT NOTIFIED Performed at [facility], [place] [address], [place], [place] [address]    Report Status 11/24/2017 FINAL  Final  Blood culture (routine x 2)     Status: Abnormal (Preliminary result)   Collection Time: 11/20/17  4:35 PM  Result Value Ref Range Status   Specimen Description BLOOD RIGHT HAND  Final   Special Requests   Final    BOTTLES DRAWN AEROBIC AND ANAEROBIC Blood Culture adequate volume   Culture  Setup Time   Final    GRAM NEGATIVE RODS IN BOTH AEROBIC AND ANAEROBIC BOTTLES CRITICAL RESULT CALLED TO, READ BACK BY AND VERIFIED WITH: M MACCIA,PHARMD AT 1547 11/21/17 BY L BENFIELD    Culture (A)  Final    HAEMOPHILUS INFLUENZAE BETA LACTAMASE POSITIVE HEALTH DEPARTMENT NOTIFIED Referred to [facility] in [place], [place] for Serotyping. Performed at [facility], [place] [address], [place], [place] [address]    Report Status  PENDING  Incomplete  Blood Culture ID Panel (Reflexed)     Status: Abnormal   Collection Time: 11/20/17  4:35 PM  Result Value Ref Range Status   Enterococcus species NOT DETECTED NOT DETECTED Final   Listeria monocytogenes NOT DETECTED NOT DETECTED Final   Staphylococcus species NOT DETECTED NOT DETECTED Final   Staphylococcus aureus NOT DETECTED NOT DETECTED Final   Streptococcus species NOT DETECTED NOT DETECTED Final   Streptococcus agalactiae NOT DETECTED NOT DETECTED Final   Streptococcus pneumoniae NOT DETECTED NOT DETECTED Final   Streptococcus pyogenes NOT DETECTED NOT DETECTED Final   Acinetobacter baumannii NOT DETECTED NOT DETECTED Final   Enterobacteriaceae species NOT DETECTED NOT DETECTED Final   Enterobacter cloacae complex NOT DETECTED NOT DETECTED Final   Escherichia coli NOT DETECTED NOT DETECTED Final   Klebsiella oxytoca NOT DETECTED NOT DETECTED Final   Klebsiella pneumoniae NOT DETECTED NOT DETECTED Final   Proteus species NOT DETECTED NOT DETECTED Final   Serratia marcescens NOT DETECTED NOT DETECTED Final   Haemophilus influenzae DETECTED (A) NOT DETECTED Final    Comment: CRITICAL RESULT CALLED TO, READ BACK BY AND VERIFIED WITH: M MACCIA,PHARMD AT 1547 11/21/17 BY L BENFIELD    Neisseria meningitidis NOT DETECTED NOT DETECTED Final   Pseudomonas aeruginosa NOT DETECTED NOT DETECTED Final   Candida albicans NOT DETECTED NOT DETECTED Final   Candida glabrata NOT DETECTED NOT DETECTED Final   Candida krusei NOT DETECTED NOT DETECTED Final   Candida parapsilosis NOT DETECTED NOT DETECTED Final   Candida tropicalis NOT DETECTED NOT DETECTED Final    Comment: Performed at [facility]: [facility], [address], [place], [place] [address]  Culture, sputum-assessment     Status: None   Collection Time: 11/20/17  6:32 PM  Result Value Ref Range Status   Specimen Description EXPECTORATED SPUTUM  Final   Special Requests Normal  Final   Sputum evaluation   Final     THIS SPECIMEN IS ACCEPTABLE FOR SPUTUM CULTURE Performed at [facility], [address], [place], [place] [address]    Report Status 11/20/2017 FINAL  Final  Culture, respiratory (NON-Expectorated)     Status:  None   Collection Time: 11/20/17  6:32 PM  Result Value Ref Range Status   Specimen Description EXPECTORATED SPUTUM  Final   Special Requests Normal Reflexed from [id number]  Final   Gram Stain   Final    MODERATE WBC PRESENT, PREDOMINANTLY PMN RARE SQUAMOUS EPITHELIAL CELLS PRESENT ABUNDANT GRAM POSITIVE COCCI IN PAIRS IN CLUSTERS MODERATE GRAM NEGATIVE RODS    Culture   Final    Consistent with normal respiratory flora. Performed at [facility], [place] [address] [place], [place] [address]    Report Status 11/23/2017 FINAL  Final  MRSA PCR Screening     Status: None   Collection Time: 11/20/17  8:28 PM  Result Value Ref Range Status   MRSA by PCR NEGATIVE NEGATIVE Final    Comment:        The GeneXpert MRSA Assay (FDA approved for NASAL specimens only), is one component of a comprehensive MRSA colonization surveillance program. It is not intended to diagnose MRSA infection nor to guide or monitor treatment for MRSA infections. Performed at [facility], [place] [address] [place], [place] [address]   Culture, blood (routine x 2)     Status: None (Preliminary result)   Collection Time: 11/24/17  1:09 PM  Result Value Ref Range Status   Specimen Description BLOOD RIGHT ANTECUBITAL  Final   Special Requests   Final    BOTTLES DRAWN AEROBIC ONLY Blood Culture results may not be optimal due to an inadequate volume of blood received in culture bottles   Culture  Setup Time   Final    GRAM POSITIVE COCCI AEROBIC BOTTLE ONLY Organism ID to follow Performed at [facility], [place] [address], [place], [place] [address]    Culture [facility] POSITIVE COCCI  Final   Report Status PENDING  Incomplete         Radiology Studies: Ct Chest Wo  Contrast  Result Date: 11/23/2017 CLINICAL DATA:  Left pleural effusion. EXAM: CT CHEST WITHOUT CONTRAST TECHNIQUE: Multidetector CT imaging of the chest was performed following the standard protocol without IV contrast. COMPARISON:  Chest x-ray from same day. CT chest dated April 17, 2014. FINDINGS: Cardiovascular: Mild cardiomegaly with unchanged left chest wall pacemaker. No pericardial effusion. Normal caliber thoracic aorta. Coronary, aortic arch, and branch vessel atherosclerotic vascular disease. Mediastinum/Nodes: Multiple prominent bilateral axillary, left supraclavicular, and mediastinal lymph nodes measuring up to 10 mm in short axis are overall similar to prior study. The thyroid gland trachea, and esophagus demonstrate no significant findings. Lungs/Pleura: Moderate to large left pleural effusion. Trace right pleural effusion. Complete collapse of the left lower lobe. Areas of mucous impaction in the right lower lobe with subsegmental atelectasis. Mild diffuse peribronchial thickening. No consolidation or pneumothorax. No suspicious pulmonary nodule. Mild mosaic attenuation throughout the lungs. Upper Abdomen: No acute abnormality. Musculoskeletal: No chest wall abnormality. No acute or significant osseous findings. Degenerative changes of the thoracic spine. IMPRESSION: 1. Moderate to large left pleural effusion with complete collapse of the left lower lobe. 2. Trace right pleural effusion with right lower lobe subsegmental atelectasis. 3. Mild mosaic attenuation throughout the lungs could reflect chronic small airways or small vessel disease. 4. Prominent bilateral axillary, left supraclavicular, and mediastinal lymph nodes measuring up to 10 mm in short axis are overall similar to prior study and likely reflect patient's history of chronic lymphocytic leukemia. 5.  Aortic atherosclerosis (ICD10-I70.0). Electronically Signed   By: Titus Dubin M.D.   On: 11/23/2017  18:00   Dg Swallowing  Func-speech Pathology  Result Date: 11/23/2017 Objective Swallowing Evaluation: Type of Study: MBS-Modified Barium Swallow Study  Patient Details Name: Oscar Baker MRN: [id number] Date of Birth: April 25, 1933 Today's Date: 11/23/2017 Time: SLP Start Time (ACUTE ONLY): 4008 -SLP Stop Time (ACUTE ONLY): 1637 SLP Time Calculation (min) (ACUTE ONLY): 53 min Past Medical History: Past Medical History: Diagnosis Date . Asthma 1950's  history of . Atrial fibrillation ([place])  . Bilateral carpal tunnel syndrome  . Bilateral lower extremity edema  . Bladder tumor  . Chronic systolic heart failure (HCC)   Echo 1/19: Mild LVH, EF 45-50, inf HK, MAC, severe LAE, severe RAE // Echo 7/15: Mild LVH, mod focal basal sept hypertrophy, EF 55-60, AV peak and mean 16/9, trivial MR, mod LAE, PASP 38 . CLL (chronic lymphocytic leukemia) [facility]) oncologist-  dr Ilene Qua--  dx [phone] ;  Lymphocytosis, CLL - per lov note 05-11-2017 currently under active survillance,  CT 04-17-2014 show very small lymphadenopathy, no indication for treatment . Coronary artery disease   cardiologist-  dr Cathie Olden--  08-18-2017 Intermittant risk nuclear study w/ large area of inferior infartion with no evidence ishcemia  . Deafness in right ear  . Diabetes mellitus type 2, noninsulin dependent ([place])  . Elevated PSA   since prostatectomy but now resolved . Hematuria 04/2017 . History of ear infection   Right . History of MI (myocardial infarction)   per myoview nuclear study 08-18-2017 , unknown when . History of shingles 08/2017  L ear and scalp, possible . Hyperlipidemia  . Hypertension  . Ischemic cardiomyopathy 09/01/2017  Presumed +CAD with Nuclear stress test 08/18/17 - Inferior scar, no ischemia, intermediate risk // med management unless +angina or worse dyspnea . OA (osteoarthritis)  . Pacemaker 02/08/2014  followed by dr g. taylor--  single chamber Biotronik due to SSS . Permanent atrial fibrillation ([place])  . Pneumonia 2019  Left lung . Prostate cancer [facility])  urologist-  dr Diona Fanti  dx 2004--  Gleason 8, PSA 10.45--  11-28-2002  s/p  radical prostatectomy;  recurrent w/ increasing PSA, started ADT treatment . RBBB (right bundle branch block)  . Sick sinus syndrome ([place])   a-Flutter with episodes of bradycardia; S/P Biotronik (serial number [id number]) 02-08-2014 . Urinary incontinence  . Wears hearing aid in right ear   receiver and transmitter Past Surgical History: Past Surgical History: Procedure Laterality Date . APPENDECTOMY   . BACK SURGERY    disk . CARDIAC CATHETERIZATION  09-03-1999  dr Cathie Olden  abnormal cardiolite study:  minor luminal irregularities but no critial coronary artery stenosis . CARDIOVASCULAR STRESS TEST  08-18-2017  dr Cathie Olden  Intermediate risk nuclear study w/ large area inferior infarction, no evidence of ishcemia (consistant w/ prior MI)/  study not gated due to frequent PVCs . CARPAL TUNNEL RELEASE Right 2000 . CARPAL TUNNEL RELEASE Left 11/19/2009 . CATARACT EXTRACTION Right 07/2015 . CATARACT EXTRACTION Left 09/2015 . CYSTOSCOPY N/A 11/04/2017  Procedure: CYSTOSCOPY AND CAUTERIZATION OF BLADDER;  Surgeon: Franchot Gallo, MD;  Location: [facility];  Service: Urology;  Laterality: N/A; . INSERTION PENILE PROSTHESIS  02-22-2004    dr Mattie Marlin  [facility] . KNEE ARTHROSCOPY Left 07/2010 . PERMANENT PACEMAKER INSERTION N/A 02/08/2014  Procedure: PERMANENT PACEMAKER INSERTION;  Surgeon: Evans Lance, MD;  Location[facility] C[facility] LAB;  Service: Cardiovascular;  Laterality: N/A; . RADICAL RETROPUBIC PROSTATECTOMY W/ BILATERAL PELVIC LYMPH NODE DISSECTION  11-28-2002   dr Mattie Marlin  [facility] . TONSILLECTOMY   .  TOTAL HIP ARTHROPLASTY Left 05/12/2016  Procedure: LEFT TOTAL HIP ARTHROPLASTY ANTERIOR APPROACH;  Surgeon: Paralee Cancel, MD;  Location: [facility]S;  Service: Orthopedics;  Laterality: Left; . TOTAL HIP ARTHROPLASTY Right 07-15-2006   dr Alvan Dame  [facility] . TRANSTHORACIC ECHOCARDIOGRAM  07/20/2017  mild LVH, ef 45-50%, hypokinesis of the  basal-midinferior myocardium, due to AFib unable to evaluate diastolic function/  severe LAE and RAE/  trivial PR and TR HPI: Pt is an 84 year-old male with a history of CLL, prostate cancer, sick sinus syndrome, pacemaker, paroxysmal atrial fibrillation, CHF with ejection fraction 45% admitted with LLL PNA. Per consult note from critical care, pt/wife describe cyclic cough for over a year, with coughing sometimes escalating during meals, especially with dry foods.  Subjective: alert, denies swallowing difficulty Assessment / Plan / Recommendation CHL IP CLINICAL IMPRESSIONS 11/23/2017 Clinical Impression Pt has a mild oropharyngeal dysphagia with structural component due to suspected osteophytes most prominent at C3-4, C4-5 that impede full epiglottic deflection and airway closure. What further impacts his ability to protect his airway is his impulsive intake (large boluses and fast rate) and premature spillage. Thin and nectar thick liquids spill into the pharynx, filling the valleculae and spilling onward to the pyriform sinuses before the swallow. Then, as he swallows, the thin liquids spills into the partially open airway, allowing for trace amounts of penetration and aspiration. Pt had significant baseline coughing that made it very challenging to determine whether coughing was related to aspiration or not. SLP provided Mod cues for attempts at a chin tuck, oral holding, and smaller bolus sizes. Small sips were the most effective at protecting the airway with thin liquids although I suspect if he could have performed the oral hold that this would further increase his safety with swallowing. Unfortunately, pt could not consistently utilize these strategies. No airway compromise occurred with thicker liquids or solids, although pt did have to take intermittent pauses during oral prep with solids to catch his breath. Although pt's dysphagia is mild, considering his impulsivity as well as his recurrent PNA and  chronic cough, would favor starting a more conservative diet at this time: Dys 3 diet and nectar thick liquids. SLP will f/u to facilitate hopeful transition back to thin liquids with improved ability to take small sips, contain liquids orally, and increase oral care/use of aspiration precautions.  SLP Visit Diagnosis Dysphagia, oropharyngeal phase (R13.12) Attention and concentration deficit following -- Frontal lobe and executive function deficit following -- Impact on safety and function Mild aspiration risk   CHL IP TREATMENT RECOMMENDATION 11/23/2017 Treatment Recommendations Therapy as outlined in treatment plan below   Prognosis 11/23/2017 Prognosis for Safe Diet Advancement Good Barriers to Reach Goals -- Barriers/Prognosis Comment -- CHL IP DIET RECOMMENDATION 11/23/2017 SLP Diet Recommendations Dysphagia 3 (Mech soft) solids;Nectar thick liquid Liquid Administration via Cup Medication Administration Whole meds with puree Compensations Slow rate;Small sips/bites Postural Changes Seated upright at 90 degrees   CHL IP OTHER RECOMMENDATIONS 11/23/2017 Recommended Consults -- Oral Care Recommendations Oral care BID Other Recommendations Order thickener from pharmacy;Prohibited food (jello, ice cream, thin soups);Remove water pitcher   CHL IP FOLLOW UP RECOMMENDATIONS 11/23/2017 Follow up Recommendations Outpatient SLP;Home health SLP   CHL IP FREQUENCY AND DURATION 11/23/2017 Speech Therapy Frequency (ACUTE ONLY) min 2x/week Treatment Duration 2 weeks      CHL IP ORAL PHASE 11/23/2017 Oral Phase Impaired Oral - Pudding Teaspoon -- Oral - Pudding Cup -- Oral - Honey Teaspoon -- Oral - Honey Cup -- Oral -  Nectar Teaspoon -- Oral - Nectar Cup Decreased bolus cohesion;Premature spillage Oral - Nectar Straw -- Oral - Thin Teaspoon -- Oral - Thin Cup Decreased bolus cohesion;Premature spillage Oral - Thin Straw Decreased bolus cohesion;Premature spillage Oral - Puree Piecemeal swallowing Oral - Mech Soft Delayed oral transit Oral  - Regular -- Oral - Multi-Consistency -- Oral - Pill -- Oral Phase - Comment --  CHL IP PHARYNGEAL PHASE 11/23/2017 Pharyngeal Phase Impaired Pharyngeal- Pudding Teaspoon -- Pharyngeal -- Pharyngeal- Pudding Cup -- Pharyngeal -- Pharyngeal- Honey Teaspoon -- Pharyngeal -- Pharyngeal- Honey Cup -- Pharyngeal -- Pharyngeal- Nectar Teaspoon -- Pharyngeal -- Pharyngeal- Nectar Cup Reduced epiglottic inversion;Reduced airway/laryngeal closure;Delayed swallow initiation-pyriform sinuses Pharyngeal -- Pharyngeal- Nectar Straw -- Pharyngeal -- Pharyngeal- Thin Teaspoon -- Pharyngeal -- Pharyngeal- Thin Cup Reduced epiglottic inversion;Reduced airway/laryngeal closure;Delayed swallow initiation-pyriform sinuses;Penetration/Aspiration during swallow Pharyngeal Material enters airway, CONTACTS cords and not ejected out Pharyngeal- Thin Straw Reduced epiglottic inversion;Reduced airway/laryngeal closure;Delayed swallow initiation-pyriform sinuses;Penetration/Aspiration during swallow Pharyngeal Material enters airway, passes BELOW cords without attempt by patient to eject out (silent aspiration) Pharyngeal- Puree Reduced epiglottic inversion;Reduced airway/laryngeal closure Pharyngeal -- Pharyngeal- Mechanical Soft Reduced epiglottic inversion;Reduced airway/laryngeal closure Pharyngeal -- Pharyngeal- Regular -- Pharyngeal -- Pharyngeal- Multi-consistency -- Pharyngeal -- Pharyngeal- Pill -- Pharyngeal -- Pharyngeal Comment --  CHL IP CERVICAL ESOPHAGEAL PHASE 11/23/2017 Cervical Esophageal Phase WFL Pudding Teaspoon -- Pudding Cup -- Honey Teaspoon -- Honey Cup -- Nectar Teaspoon -- Nectar Cup -- Nectar Straw -- Thin Teaspoon -- Thin Cup -- Thin Straw -- Puree -- Mechanical Soft -- Regular -- Multi-consistency -- Pill -- Cervical Esophageal Comment -- No flowsheet data found. [name] 11/23/2017, 5:04 PM  [name], M.A. CCC-SLP [phone]                  Scheduled Meds: . atorvastatin  20 mg Oral q1800   . carvedilol  3.125 mg Oral BID  . fenofibrate  54 mg Oral Daily  . furosemide  40 mg Intravenous BID  . guaiFENesin  600 mg Oral BID  . insulin aspart  0-5 Units Subcutaneous QHS  . insulin aspart  0-9 Units Subcutaneous TID WC  . insulin aspart  4 Units Subcutaneous TID WC  . insulin glargine  22 Units Subcutaneous QHS  . ipratropium-albuterol  3 mL Nebulization TID  . mouth rinse  15 mL Mouth Rinse BID  . methylPREDNISolone (SOLU-MEDROL) injection  40 mg Intravenous Daily  . Warfarin - Pharmacist Dosing Inpatient   Does not apply q1800   Continuous Infusions: . cefTRIAXone (ROCEPHIN)  IV Stopped (11/24/17 1836)     LOS: 5 days    Time spent: 74    Latiqua Daloia A, MD [facility] Pager [phone]  If 7PM-7AM, please contact night-coverage [email or web address] Password [email or web address] 11/25/2017, 9:05 AM

## 2017-11-25 NOTE — Progress Notes (Signed)
Patient is not compliant with adding thickener to liquids.  This AM he did not want the Nurse Tech to add thickener to his prune juice.  This RN explained the thickener is necessary because we do not want him to aspirate.  Patient said he knows what's best for him.  Nurse tech and I said we could not give him the prune juice without adding the thickener.  Patient requested to to add the thickener himself.  He put some thickener in his beverage, but not enough for the consistency of nectar.

## 2017-11-25 NOTE — Progress Notes (Signed)
PHARMACY - PHYSICIAN COMMUNICATION CRITICAL VALUE ALERT - BLOOD CULTURE IDENTIFICATION (BCID)  Oscar Baker is an 84 y.o. male who presented to [facility] on 11/20/2017 with a chief complaint of cough/fever  Assessment: 39 YOM admitted on 6/1 with concern for PNA and found to have bacteremia with likely causative PNA bacteria, H influenzae B-lactamase positive and antibiotics have been narrowed to Rocephin. Repeat blood cultures from 6/5 are growing GPC in 1 of 3 sets - with BCID showing coag neg staph - likely representative of a contaminant. No antibiotic changes recommended at this time.   Name of physician (or Provider) Contacted: Shanon Brow  Current antibiotics: Rocephin 2g IV every 24 hours  Changes to prescribed antibiotics recommended:  Patient is on recommended antibiotics - No changes needed  Results for orders placed or performed during the hospital encounter of 11/20/17  Blood Culture ID Panel (Reflexed) (Collected: 11/20/2017  4:35 PM)  Result Value Ref Range   Enterococcus species NOT DETECTED NOT DETECTED   Listeria monocytogenes NOT DETECTED NOT DETECTED   Staphylococcus species NOT DETECTED NOT DETECTED   Staphylococcus aureus NOT DETECTED NOT DETECTED   Streptococcus species NOT DETECTED NOT DETECTED   Streptococcus agalactiae NOT DETECTED NOT DETECTED   Streptococcus pneumoniae NOT DETECTED NOT DETECTED   Streptococcus pyogenes NOT DETECTED NOT DETECTED   Acinetobacter baumannii NOT DETECTED NOT DETECTED   Enterobacteriaceae species NOT DETECTED NOT DETECTED   Enterobacter cloacae complex NOT DETECTED NOT DETECTED   Escherichia coli NOT DETECTED NOT DETECTED   Klebsiella oxytoca NOT DETECTED NOT DETECTED   Klebsiella pneumoniae NOT DETECTED NOT DETECTED   Proteus species NOT DETECTED NOT DETECTED   Serratia marcescens NOT DETECTED NOT DETECTED   Haemophilus influenzae DETECTED (A) NOT DETECTED   Neisseria meningitidis NOT DETECTED NOT DETECTED   Pseudomonas aeruginosa  NOT DETECTED NOT DETECTED   Candida albicans NOT DETECTED NOT DETECTED   Candida glabrata NOT DETECTED NOT DETECTED   Candida krusei NOT DETECTED NOT DETECTED   Candida parapsilosis NOT DETECTED NOT DETECTED   Candida tropicalis NOT DETECTED NOT DETECTED    Lawson Radar 11/25/2017  10:23 AM

## 2017-11-26 ENCOUNTER — Inpatient Hospital Stay (HOSPITAL_COMMUNITY): Payer: Medicare Other

## 2017-11-26 ENCOUNTER — Encounter (HOSPITAL_COMMUNITY): Payer: Self-pay | Admitting: Student

## 2017-11-26 HISTORY — PX: IR THORACENTESIS ASP PLEURAL SPACE W/IMG GUIDE: IMG5380

## 2017-11-26 LAB — CBC WITH DIFFERENTIAL/PLATELET
Basophils Absolute: 0 10*3/uL (ref 0.0–0.1)
Basophils Relative: 0 %
Eosinophils Absolute: 0 10*3/uL (ref 0.0–0.7)
Eosinophils Relative: 0 %
HEMATOCRIT: 28.9 % — AB (ref 39.0–52.0)
HEMOGLOBIN: 9.8 g/dL — AB (ref 13.0–17.0)
LYMPHS PCT: 57 %
Lymphs Abs: 21.3 10*3/uL — ABNORMAL HIGH (ref 0.7–4.0)
MCH: 32.6 pg (ref 26.0–34.0)
MCHC: 33.9 g/dL (ref 30.0–36.0)
MCV: 96 fL (ref 78.0–100.0)
MONOS PCT: 7 %
Monocytes Absolute: 2.6 10*3/uL — ABNORMAL HIGH (ref 0.1–1.0)
Neutro Abs: 13.4 10*3/uL — ABNORMAL HIGH (ref 1.7–7.7)
Neutrophils Relative %: 36 %
Platelets: 550 10*3/uL — ABNORMAL HIGH (ref 150–400)
RBC: 3.01 MIL/uL — AB (ref 4.22–5.81)
RDW: 18.5 % — ABNORMAL HIGH (ref 11.5–15.5)
WBC: 37.3 10*3/uL — AB (ref 4.0–10.5)

## 2017-11-26 LAB — GLUCOSE, PLEURAL OR PERITONEAL FLUID: GLUCOSE FL: 154 mg/dL

## 2017-11-26 LAB — BODY FLUID CELL COUNT WITH DIFFERENTIAL
Eos, Fluid: 0 %
Lymphs, Fluid: 1 %
MONOCYTE-MACROPHAGE-SEROUS FLUID: 3 % — AB (ref 50–90)
Neutrophil Count, Fluid: 96 % — ABNORMAL HIGH (ref 0–25)
Total Nucleated Cell Count, Fluid: 6005 cu mm — ABNORMAL HIGH (ref 0–1000)

## 2017-11-26 LAB — PROTEIN, PLEURAL OR PERITONEAL FLUID: Total protein, fluid: 3.5 g/dL

## 2017-11-26 LAB — AMYLASE, PLEURAL OR PERITONEAL FLUID: AMYLASE FL: 19 U/L

## 2017-11-26 LAB — BASIC METABOLIC PANEL
ANION GAP: 7 (ref 5–15)
BUN: 32 mg/dL — ABNORMAL HIGH (ref 6–20)
CHLORIDE: 100 mmol/L — AB (ref 101–111)
CO2: 29 mmol/L (ref 22–32)
Calcium: 8.7 mg/dL — ABNORMAL LOW (ref 8.9–10.3)
Creatinine, Ser: 0.91 mg/dL (ref 0.61–1.24)
GFR calc Af Amer: >60 mL/min (ref >60)
GFR calc non Af Amer: >60 mL/min (ref >60)
Glucose, Bld: 238 mg/dL — ABNORMAL HIGH (ref 65–99)
Potassium: 4.4 mmol/L (ref 3.5–5.1)
Sodium: 136 mmol/L (ref 135–145)

## 2017-11-26 LAB — GLUCOSE, CAPILLARY
GLUCOSE-CAPILLARY: 198 mg/dL — AB (ref 65–99)
GLUCOSE-CAPILLARY: 337 mg/dL — AB (ref 65–99)
GLUCOSE-CAPILLARY: 274 mg/dL — AB (ref 65–99)
Glucose-Capillary: 232 mg/dL — ABNORMAL HIGH (ref 65–99)

## 2017-11-26 LAB — PROTIME-INR
INR: 1.44
Prothrombin Time: 17.4 seconds — ABNORMAL HIGH (ref 11.4–15.2)

## 2017-11-26 LAB — LACTATE DEHYDROGENASE, PLEURAL OR PERITONEAL FLUID: LD FL: 2393 U/L — AB (ref 3–23)

## 2017-11-26 MED ORDER — FUROSEMIDE 20 MG PO TABS
20.0000 mg | ORAL_TABLET | Freq: Every day | ORAL | Status: DC
  Administered 2017-11-26 – 2017-11-28 (×3): 20 mg via ORAL
  Filled 2017-11-26 (×3): qty 1

## 2017-11-26 MED ORDER — IPRATROPIUM-ALBUTEROL 0.5-2.5 (3) MG/3ML IN SOLN
3.0000 mL | Freq: Two times a day (BID) | RESPIRATORY_TRACT | Status: DC
  Administered 2017-11-26 – 2017-11-28 (×4): 3 mL via RESPIRATORY_TRACT
  Filled 2017-11-26 (×4): qty 3

## 2017-11-26 MED ORDER — LIDOCAINE HCL (PF) 2 % IJ SOLN
INTRAMUSCULAR | Status: AC
  Filled 2017-11-26: qty 10

## 2017-11-26 MED ORDER — WARFARIN SODIUM 5 MG PO TABS
5.0000 mg | ORAL_TABLET | Freq: Once | ORAL | Status: AC
  Administered 2017-11-26: 5 mg via ORAL
  Filled 2017-11-26: qty 1

## 2017-11-26 NOTE — Progress Notes (Signed)
Patient continues to refuse to allow staff to use thicker in his fluids this morning Patient educated on importance of using thicker to prevent aspiration  Cough remained persistent throughout the night  Pt declined PRN medications available for cough, says they don't help his cough at all.

## 2017-11-26 NOTE — Progress Notes (Addendum)
Name: Oscar Baker MRN: [id number] DOB: 1932/09/05    ADMISSION DATE:  11/20/2017 CONSULTATION DATE: 11/23/2017  REFERRING MD : [facility]  CHIEF COMPLAINT: Chronic cough, green sputum production for over a year  BRIEF PATIENT DESCRIPTION: 84 y/o M admitted 6/1 with chronic cough, green sputum production.  Prior pulmonary work up for cough by Dr. Melvyn Novas included recommendation for allergy follow up (pt has not yet completed).  Concern for aspiration with eating > SLP evaluation demonstrated aspiration.  Found to have a left pleural effusion, PCCM consulted 6/4 for evaluation.  06/2017 ECHO demonstrated an EF of 45-50%, LVH, hypokinesis of basilar mid inferior myocardium. Pt reported ongoing green sputum production despite being on antibiotics.His cough is very hoarse and explosive.  His wife notes that his coughing does escalate during times of eating.  She also she further notes he is been coughing like this for over a year, so suspect this is a chronic aspiration.[name] LLL infiltrate appears to be chronic but associated effusion is larger than prior exams.    PMH:  Prostate cancer s/p prostatectomy / radiation and hormone therapy followed by Dr. Diona Fanti, Dr. Alen Blew.  CLL with no treatment, monitored by ONC.  PAF on coumadin, pacemaker, DM, Anemia, HTN.   SUBJECTIVE: Pt. sitting up in chair, awake and alert on 2 L [place]  VITAL SIGNS: Temp:  [96.3 F (35.7 C)-97.8 F (36.6 C)] 96.3 F (35.7 C) (06/07 0820) Pulse Rate:  [61-77] 61 (06/07 0820) Resp:  [15-27] 15 (06/07 0820) BP: (111-136)/(53-91) 132/53 (06/07 0820) SpO2:  [92 %-98 %] 92 % (06/07 0820)  PHYSICAL EXAMINATION: General: elderly male in NAD, sitting up in chair , on 2 L [place] with sats of 97% HEENT: MM pink/moist, good dentition, HOH / bilateral hearing aides, + LAD Neuro: A&Ox4, speech clear, MAE x 4 CV: s1s2 rrr, no m/r/g per bedside tele PULM: even/non-labored, scattered rhonchi, diminished per bases L>R GM:WNUU, non-tender, ND bsx4  active  Extremities: warm/dry, BLE 1+ edema  Skin: no rashes or lesions  Recent Labs  Lab 11/24/17 0306 11/24/17 0930 11/25/17 0532 11/26/17 0403  NA 132*  --  135 136  K 4.8  --  4.6 4.4  CL 96*  --  97* 100*  CO2 28  --  29 29  BUN 22*  --  31* 32*  CREATININE 0.96  --  0.97 0.91  GLUCOSE 350* 508* 297* 238*   Recent Labs  Lab 11/24/17 0306 11/25/17 0532 11/26/17 0403  HGB 9.9* 9.4* 9.8*  HCT 29.5* 27.4* 28.9*  WBC 46.7* 43.6* 37.3*  PLT 463* 507* 550*   Dg Chest Port 1 View  Result Date: 11/25/2017 CLINICAL DATA:  84 year old male with shortness of breath and pleural effusion. EXAM: PORTABLE CHEST 1 VIEW COMPARISON:  11/23/2017 CT, chest radiograph and prior studies FINDINGS: Cardiomediastinal silhouette is unchanged. A LEFT pacemaker again noted. LEFT pleural effusion and LEFT LOWER lung consolidation/atelectasis again noted. The RIGHT lung is clear. No pneumothorax or acute bony abnormality. IMPRESSION: Little significant change in appearance of the chest with continued LEFT pleural effusion and LEFT LOWER lung atelectasis/consolidation. Electronically Signed   By: Margarette Canada M.D.   On: 11/25/2017 16:53   SIGNIFICANT EVENTS  6/01  Admit  6/04  PCCM consulted   STUDIES:  SLP 6/5 >>  Delayed cough with thin liquids even small spoon sized amounts Given baseline coughing it is difficult to tell if this is related to aspiration Recommend continuing Dys 3 diet and nectar thick  liquids  but allowing ice chips in between meals after oral care. >> Speech will continue to follow  Imaging CXR 6/6 Little significant change in appearance of the chest with continued LEFT pleural effusion and LEFT LOWER lung atelectasis/consolidation.  CT Chest 6/4>> Moderate to large left pleural effusion with complete collapse of the left lower lobe. 2. Trace right pleural effusion with right lower lobe subsegmental atelectasis. 3. Mild mosaic attenuation throughout the lungs could  reflect chronic small airways or small vessel disease.  Pleural Fluid Eval:( Were able to get 10 cc of fluid prior to stopping due to coughing) 11/25/2017:  Gram Stain>> ABUNDANT WBC PRESENT,BOTH PMN AND MONONUCLEAR  NO ORGANISMS SEEN    ASSESSMENT: HCAP in setting of recurrent Aspiration  Acute Hypoxemic Respiratory Failure - in setting of aspiration, effusion  Left Pleural Effusion  Laryngopharyngeal Reflux Disease / Dysphagia  Chronic Cough  Allergic Rhinitis  H. Influenzae Bacteremia - sputum negative but suspect from pulmonary source CLL  Pulmonary: A Infiltrate concerning for aspiration pneumonia Sats 92 % on 2 L SLP  Remains suspicious for aspiration Continues to cough Pleural Effusion>> post thora attempt  6/6 at bedside Had to stop due to coughing>> plan for thora today in IR, CT guided INR is 1.44  LAN>> suspect reactive  P: Titrate oxygen for sats 88-92% ABX as ordered Trend CXR follow effusion and infiltrate Re-attempt Thora >> CT guided per IR 6/7 >> INR 1.44 CXR post procedure Fluid for evaluation Follow pleural fluid micro Continue Dysphagia 3 diet, swallow precautions, SLP to follow Aggressive pulmonary toilet Mobilize as able  Continue Mucinex for mucocilliary clearance  Flutter valve and IS Consider Mucomyst nebs for sputum clearance after Thora today. ( Pt. States they help him cough up secretions)  Follow up CT Chest post antibiotic treatment to re-evaluate infection/ lympadenopathy  PCCM will follow   Magdalen Spatz, AGACNP-BC [place]. Pager: [phone].  11/26/2017 8:58 AM  Attending Note:  84 year old male with history of chronic cough and aspiration.  Returns s/p fall and pleural effusion.  On exam, decreased BS L>R.  I reviewed CXR myself, pleural effusion noted.  We performed a thora yesterday but was only able to remove 10 ml of fluid that was sent to analysis.  Analysis pending.  Discussed with  PCCM-NP.  Infiltrate: concerning for pneumonia  - Abx as ordered above  - F/U CXR post thora today  Effusion:  - Hold further K at this point  - Restart coumadin post thora today  - IR to perform thora today  - Await fluid analysis  LAN: likely reactive  - F/U CT post treatment of infection  Dysphagia:  - Dysphagia 3 diet  Cough: concern for aspiration  - Speech pathology following  - Care with feeding.  Inocencio Homes being done by IR.  If there are evidence of empyema on the fluid analysis then please call PCCM for placement of a chest tube.  Otherwise, PCCM will see again on Monday.  Patient seen and examined, agree with above note.  I dictated the care and orders written for this patient under my direction.  Rush Farmer, [place]

## 2017-11-26 NOTE — Progress Notes (Signed)
Patient ID: Oscar Baker, male   DOB: 01/25/1933, 84 y.o.   MRN: [id number] Patient ID: Oscar Baker, male   DOB: 1932/12/25, 84 y.o.   MRN: [id number]  PROGRESS NOTE    Oscar Baker  YJE:[id number] DOB: 1932-12-09 DOA: 11/20/2017 PCP: [name], MD     Brief Narrative:   84 year-old male with a history of CLL, prostate cancer, sick sinus syndrome, pacemaker, paroxysmal atrial fibrillation, CHF with ejection fraction 45% admitted with fever increasing shortness of breath cough with yellow to green phlegm for 1 week prior to admission. He was found to have left lower lobe pneumonia with associated effusion and is admitted for the treatment of the same.  Thoracentesis was attempted on 11/25/2017 by pulmonology service but had to be aborted due to coughing.  Being reattempted  11/26/2017 by IR.    Assessment & Plan:   Principal Problem:   HCAP (healthcare-associated pneumonia) Active Problems:   HTN (hypertension)   Hyperlipidemia   Diabetes mellitus type 2, noninsulin dependent (HCC)   LPRD (laryngopharyngeal reflux disease)   Other allergic rhinitis   Anemia   Chronic cough   CLL (chronic lymphocytic leukemia) (HCC)   Pleural effusion   Pneumonia of left lower lobe due to infectious organism [facility])   Mediastinal lymphadenopathy   Hypoxemia  1]HCAP/H influenza bacteremia-patient improving clinically every day.  Currently on ceftriaxone 2 g IV every 24 hours for H influenza bacteremia. Blood culture came back positive for H. influenzae.  Repeat blood cultures negative thus far. Increasing leukocytosis secondary to CLL.   Steroids were started and are being weaned due to hyperglycemia, pulmonary consulted and are following.  Follow-up on pleural fluid cultures also pending  2]paroxysmal atrial fibrillation with CHF continue carvedilol and Coumadin on hold right now possible tap.  Increase the dose of Lasix to 40 mg IV twice daily for 3 days and then placed back on home dose of 20 mg  orally daily.  Chest x-ray done yesterday and today noted with left sided infiltrates and opacity and pleural effusion. Patient is not on potassium replacement acute remains normal at 4.4 follow-up tomorrow.  Echocardiogram with ejection fraction 45 to 50% January 2019.  3]history of hypertension continue carvedilol and Lasix.  4]type 2 diabetes  blood sugar has been elevated.  Increasing sliding scale needs and Lantus 20 to 22 units nightly weaning steroids.  5]hyponatremia-Secondary to pneumonia follow-up levels.   Improving slowly  6) high aspiration risk-patient is not happy about thickening his liquids.  Speech therapy and dietitian consulted.      DVT prophylaxis: SCDs and Coumadin on hold for now code Status: DNR  family Communication: Wife  disposition Plan: Per pulmonary team  Consultants:   Pulmonology  Procedures: Thoracentesis planned   Antimicrobials:   Rocephin 2 g IV every 24 hours   Subjective: No complaints.  Feeling better daily.  Objective: Vitals:   11/26/17 0400 11/26/17 0500 11/26/17 0734 11/26/17 0820  BP: (!) 120/57   (!) 132/53  Pulse: 75   61  Resp: (!) 25   15  Temp: (!) 97.5 F (36.4 C) 97.6 F (36.4 C)  (!) 96.3 F (35.7 C)  TempSrc: Oral Oral  Axillary  SpO2: 95%  98% 92%  Weight:      Height:        Intake/Output Summary (Last 24 hours) at 11/26/2017 1440 Last data filed at 11/26/2017 1000 Gross per 24 hour  Intake -  Output 3600 ml  Net -3600  ml   Filed Weights   11/20/17 2100 11/21/17 0537  Weight: 97.1 kg (214 lb 1.1 oz) 63 kg (138 lb 12.8 oz)    Examination:  General exam: Appears calm and comfortable  Respiratory system: Clear to auscultation. Respiratory effort normal.   much improved breath sounds on the left. Cardiovascular system: S1 & S2 heard, RRR. No JVD, murmurs, rubs, gallops or clicks. No pedal edema. Gastrointestinal system: Abdomen is nondistended, soft and nontender. No organomegaly or masses felt.  Normal bowel sounds heard. Central nervous system: Alert and oriented. No focal neurological deficits. Extremities: Symmetric 5 x 5 power. Skin: No rashes, lesions or ulcers Psychiatry: Judgement and insight appear normal. Mood & affect appropriate.     Data Reviewed: I have personally reviewed following labs and imaging studies  CBC: Recent Labs  Lab 11/20/17 1339  11/22/17 0241 11/23/17 0218 11/24/17 0306 11/25/17 0532 11/26/17 0403  WBC 35.5*   < > 47.3* 60.0* 46.7* 43.6* 37.3*  NEUTROABS 12.8*  --   --   --   --  19.6* 13.4*  HGB 11.0*   < > 10.1* 10.8* 9.9* 9.4* 9.8*  HCT 33.3*   < > 30.3* 33.1* 29.5* 27.4* 28.9*  MCV 99.4   < > 99.7 98.2 96.1 94.8 96.0  PLT 372   < > 381 455* 463* 507* 550*   < > = values in this interval not displayed.   Basic Metabolic Panel: Recent Labs  Lab 11/22/17 0241 11/23/17 0218 11/24/17 0306 11/24/17 0930 11/25/17 0532 11/26/17 0403  NA 130* 131* 132*  --  135 136  K 4.4 4.6 4.8  --  4.6 4.4  CL 99* 98* 96*  --  97* 100*  CO2 22 24 28   --  29 29  GLUCOSE 190* 226* 350* 508* 297* 238*  BUN 17 19 22*  --  31* 32*  CREATININE 0.82 0.94 0.96  --  0.97 0.91  CALCIUM 8.2* 8.7* 8.6*  --  8.7* 8.7*  MG 1.9  --   --   --   --   --    GFR: Estimated Creatinine Clearance: 54.8 mL/min (by C-G formula based on SCr of 0.91 mg/dL). Liver Function Tests: Recent Labs  Lab 11/20/17 1339 11/21/17 0226 11/25/17 1922  AST 36 24  --   ALT 26 21  --   ALKPHOS 72 62  --   BILITOT 1.1 1.6*  --   PROT 6.8 5.7* 5.9*  ALBUMIN 3.2* 2.6*  --    No results for input(s): LIPASE, AMYLASE in the last 168 hours. No results for input(s): AMMONIA in the last 168 hours. Coagulation Profile: Recent Labs  Lab 11/22/17 0241 11/23/17 0218 11/24/17 0306 11/25/17 0532 11/26/17 0403  INR 3.05 3.64 3.66 2.07 1.44   Cardiac Enzymes: Recent Labs  Lab 11/20/17 1831  TROPONINI <0.03   BNP (last 3 results) No results for input(s): PROBNP in the last 8760  hours. HbA1C: No results for input(s): HGBA1C in the last 72 hours. CBG: Recent Labs  Lab 11/25/17 1200 11/25/17 1650 11/25/17 2112 11/26/17 0818 11/26/17 1337  GLUCAP 227* 267* 307* 232* 198*   Lipid Profile: No results for input(s): CHOL, HDL, LDLCALC, TRIG, CHOLHDL, LDLDIRECT in the last 72 hours. Thyroid Function Tests: No results for input(s): TSH, T4TOTAL, FREET4, T3FREE, THYROIDAB in the last 72 hours. Anemia Panel: No results for input(s): VITAMINB12, FOLATE, FERRITIN, TIBC, IRON, RETICCTPCT in the last 72 hours. Sepsis Labs: Recent Labs  Lab 11/20/17 1353  11/20/17 1643  LATICACIDVEN 1.38 2.20*    Recent Results (from the past 240 hour(s))  Blood culture (routine x 2)     Status: Abnormal   Collection Time: 11/20/17  4:30 PM  Result Value Ref Range Status   Specimen Description BLOOD RIGHT ANTECUBITAL  Final   Special Requests   Final    BOTTLES DRAWN AEROBIC AND ANAEROBIC Blood Culture adequate volume   Culture  Setup Time   Final    GRAM NEGATIVE RODS IN BOTH AEROBIC AND ANAEROBIC BOTTLES CRITICAL RESULT CALLED TO, READ BACK BY AND VERIFIED WITH: M MACCIA,PHARMD AT 1547 11/21/17 BY L BENFIELD    Culture (A)  Final    HAEMOPHILUS INFLUENZAE BETA LACTAMASE POSITIVE HEALTH DEPARTMENT NOTIFIED Performed at [facility], [place] [address] [place], [place] [address]    Report Status 11/24/2017 FINAL  Final  Blood culture (routine x 2)     Status: Abnormal (Preliminary result)   Collection Time: 11/20/17  4:35 PM  Result Value Ref Range Status   Specimen Description BLOOD RIGHT HAND  Final   Special Requests   Final    BOTTLES DRAWN AEROBIC AND ANAEROBIC Blood Culture adequate volume   Culture  Setup Time   Final    GRAM NEGATIVE RODS IN BOTH AEROBIC AND ANAEROBIC BOTTLES CRITICAL RESULT CALLED TO, READ BACK BY AND VERIFIED WITH: M MACCIA,PHARMD AT 1547 11/21/17 BY L BENFIELD    Culture (A)  Final    HAEMOPHILUS INFLUENZAE BETA LACTAMASE POSITIVE HEALTH  DEPARTMENT NOTIFIED Referred to [facility] in [place], [place] for Serotyping. Performed at [facility], [place] [address] [place], [place] [address]    Report Status PENDING  Incomplete  Blood Culture ID Panel (Reflexed)     Status: Abnormal   Collection Time: 11/20/17  4:35 PM  Result Value Ref Range Status   Enterococcus species NOT DETECTED NOT DETECTED Final   Listeria monocytogenes NOT DETECTED NOT DETECTED Final   Staphylococcus species NOT DETECTED NOT DETECTED Final   Staphylococcus aureus NOT DETECTED NOT DETECTED Final   Streptococcus species NOT DETECTED NOT DETECTED Final   Streptococcus agalactiae NOT DETECTED NOT DETECTED Final   Streptococcus pneumoniae NOT DETECTED NOT DETECTED Final   Streptococcus pyogenes NOT DETECTED NOT DETECTED Final   Acinetobacter baumannii NOT DETECTED NOT DETECTED Final   Enterobacteriaceae species NOT DETECTED NOT DETECTED Final   Enterobacter cloacae complex NOT DETECTED NOT DETECTED Final   Escherichia coli NOT DETECTED NOT DETECTED Final   Klebsiella oxytoca NOT DETECTED NOT DETECTED Final   Klebsiella pneumoniae NOT DETECTED NOT DETECTED Final   Proteus species NOT DETECTED NOT DETECTED Final   Serratia marcescens NOT DETECTED NOT DETECTED Final   Haemophilus influenzae DETECTED (A) NOT DETECTED Final    Comment: CRITICAL RESULT CALLED TO, READ BACK BY AND VERIFIED WITH: M MACCIA,PHARMD AT 1547 11/21/17 BY L BENFIELD    Neisseria meningitidis NOT DETECTED NOT DETECTED Final   Pseudomonas aeruginosa NOT DETECTED NOT DETECTED Final   Candida albicans NOT DETECTED NOT DETECTED Final   Candida glabrata NOT DETECTED NOT DETECTED Final   Candida krusei NOT DETECTED NOT DETECTED Final   Candida parapsilosis NOT DETECTED NOT DETECTED Final   Candida tropicalis NOT DETECTED NOT DETECTED Final    Comment: Performed at [facility], [address], [place], [place] [address]  Culture, sputum-assessment      Status: None   Collection Time: 11/20/17  6:32 PM  Result Value  Ref Range Status   Specimen Description EXPECTORATED SPUTUM  Final   Special Requests Normal  Final   Sputum evaluation   Final    THIS SPECIMEN IS ACCEPTABLE FOR SPUTUM CULTURE Performed at [facility], [place] [address] [place], [place] [address]    Report Status 11/20/2017 FINAL  Final  Culture, respiratory (NON-Expectorated)     Status: None   Collection Time: 11/20/17  6:32 PM  Result Value Ref Range Status   Specimen Description EXPECTORATED SPUTUM  Final   Special Requests Normal Reflexed from [phone]  Final   Gram Stain   Final    MODERATE WBC PRESENT, PREDOMINANTLY PMN RARE SQUAMOUS EPITHELIAL CELLS PRESENT ABUNDANT GRAM POSITIVE COCCI IN PAIRS IN CLUSTERS MODERATE GRAM NEGATIVE RODS    Culture   Final    Consistent with normal respiratory flora. Performed at [facility], [place] [address], [place], [place] [address]    Report Status 11/23/2017 FINAL  Final  MRSA PCR Screening     Status: None   Collection Time: 11/20/17  8:28 PM  Result Value Ref Range Status   MRSA by PCR NEGATIVE NEGATIVE Final    Comment:        The GeneXpert MRSA Assay (FDA approved for NASAL specimens only), is one component of a comprehensive MRSA colonization surveillance program. It is not intended to diagnose MRSA infection nor to guide or monitor treatment for MRSA infections. Performed at [facility], [place] [address], [place], [place] [address]   Culture, blood (routine x 2)     Status: None (Preliminary result)   Collection Time: 11/24/17  1:00 PM  Result Value Ref Range Status   Specimen Description BLOOD LEFT ANTECUBITAL  Final   Special Requests   Final    BOTTLES DRAWN AEROBIC AND ANAEROBIC Blood Culture adequate volume   Culture   Final    NO GROWTH 1 DAY Performed at [facility], [place] [address], [place],  [address]    Report Status PENDING  Incomplete  Culture, blood (routine x  2)     Status: Abnormal (Preliminary result)   Collection Time: 11/24/17  1:09 PM  Result Value Ref Range Status   Specimen Description BLOOD RIGHT ANTECUBITAL  Final   Special Requests   Final    BOTTLES DRAWN AEROBIC ONLY Blood Culture results may not be optimal due to an inadequate volume of blood received in culture bottles   Culture  Setup Time   Final    GRAM POSITIVE COCCI AEROBIC BOTTLE ONLY CRITICAL RESULT CALLED TO, READ BACK BY AND VERIFIED WITH: Ferne Coe PharmD 10:25 11/25/17 ([email or web address]) Performed at [facility], [address] [place],  [address]    Culture STAPHYLOCOCCUS SPECIES (COAGULASE NEGATIVE) (A)  Final   Report Status PENDING  Incomplete  Blood Culture ID Panel (Reflexed)     Status: Abnormal   Collection Time: 11/24/17  1:09 PM  Result Value Ref Range Status   Enterococcus species NOT DETECTED NOT DETECTED Final   Listeria monocytogenes NOT DETECTED NOT DETECTED Final   Staphylococcus species DETECTED (A) NOT DETECTED Final    Comment: Methicillin (oxacillin) resistant coagulase negative staphylococcus. Possible blood culture contaminant (unless isolated from more than one blood culture draw or clinical case suggests pathogenicity). No antibiotic treatment is indicated for blood  culture contaminants. CRITICAL RESULT CALLED TO, READ BACK BY AND VERIFIED WITH: Ferne Coe PharmD 10:25 11/25/17 ([email or web address])    Staphylococcus aureus NOT  DETECTED NOT DETECTED Final   Methicillin resistance DETECTED (A) NOT DETECTED Final    Comment: CRITICAL RESULT CALLED TO, READ BACK BY AND VERIFIED WITH: Ferne Coe PharmD 10:25 11/25/17 ([email or web address])    Streptococcus species NOT DETECTED NOT DETECTED Final   Streptococcus agalactiae NOT DETECTED NOT DETECTED Final   Streptococcus pneumoniae NOT DETECTED NOT DETECTED Final   Streptococcus pyogenes NOT DETECTED NOT DETECTED Final   Acinetobacter baumannii NOT DETECTED NOT DETECTED Final   Enterobacteriaceae species NOT DETECTED  NOT DETECTED Final   Enterobacter cloacae complex NOT DETECTED NOT DETECTED Final   Escherichia coli NOT DETECTED NOT DETECTED Final   Klebsiella oxytoca NOT DETECTED NOT DETECTED Final   Klebsiella pneumoniae NOT DETECTED NOT DETECTED Final   Proteus species NOT DETECTED NOT DETECTED Final   Serratia marcescens NOT DETECTED NOT DETECTED Final   Haemophilus influenzae NOT DETECTED NOT DETECTED Final   Neisseria meningitidis NOT DETECTED NOT DETECTED Final   Pseudomonas aeruginosa NOT DETECTED NOT DETECTED Final   Candida albicans NOT DETECTED NOT DETECTED Final   Candida glabrata NOT DETECTED NOT DETECTED Final   Candida krusei NOT DETECTED NOT DETECTED Final   Candida parapsilosis NOT DETECTED NOT DETECTED Final   Candida tropicalis NOT DETECTED NOT DETECTED Final    Comment: Performed at [facility], [place]. [address], [place], [place] [address]  Gram stain     Status: None   Collection Time: 11/25/17  3:43 PM  Result Value Ref Range Status   Specimen Description FLUID PLEURAL  Final   Special Requests NONE  Final   Gram Stain   Final    ABUNDANT WBC PRESENT,BOTH PMN AND MONONUCLEAR NO ORGANISMS SEEN Performed at [facility], [address] [place], [place] [address]    Report Status 11/25/2017 FINAL  Final         Radiology Studies: Dg Chest 1 View  Result Date: 11/26/2017 CLINICAL DATA:  Status post thoracentesis. EXAM: CHEST  1 VIEW COMPARISON:  Yesterday. FINDINGS: Significant decrease in amount of left pleural fluid following thoracentesis. No pneumothorax. Stable enlarged cardiac silhouette. Dense airspace opacity at the left lung base, increased. Clear right lung with an overlying skin fold. Bilateral shoulder degenerative changes. IMPRESSION: 1. No pneumothorax following left thoracentesis. 2. Dense left lower lobe atelectasis or pneumonia, significantly increased. 3. Stable cardiomegaly. Electronically Signed   By: Claudie Revering M.D.   On: 11/26/2017 13:06    Dg Chest Port 1 View  Result Date: 11/25/2017 CLINICAL DATA:  84 year old male with shortness of breath and pleural effusion. EXAM: PORTABLE CHEST 1 VIEW COMPARISON:  11/23/2017 CT, chest radiograph and prior studies FINDINGS: Cardiomediastinal silhouette is unchanged. A LEFT pacemaker again noted. LEFT pleural effusion and LEFT LOWER lung consolidation/atelectasis again noted. The RIGHT lung is clear. No pneumothorax or acute bony abnormality. IMPRESSION: Little significant change in appearance of the chest with continued LEFT pleural effusion and LEFT LOWER lung atelectasis/consolidation. Electronically Signed   By: Margarette Canada M.D.   On: 11/25/2017 16:53   Ir Thoracentesis Asp Pleural Space W/img Guide  Result Date: 11/26/2017 INDICATION: Patient with left pleural effusion. Request is made for diagnostic and therapeutic thoracentesis. EXAM: ULTRASOUND GUIDED DIAGNOSTIC AND THERAPEUTIC LEFT THORACENTESIS MEDICATIONS: 10 mL 2% lidocaine COMPLICATIONS: None immediate. PROCEDURE: An ultrasound guided thoracentesis was thoroughly discussed with the patient and questions answered. The benefits, risks, alternatives and complications were also discussed. The patient understands and wishes to proceed with the procedure. Written consent was obtained. Ultrasound was  performed to localize and mark an adequate pocket of fluid in the left chest. The area was then prepped and draped in the normal sterile fashion. 2% lidocaine was used for local anesthesia. Under ultrasound guidance a Safe-T-Centesis catheter was introduced. Thoracentesis was performed. The catheter was removed and a dressing applied. FINDINGS: A total of approximately 70 mL of cloudy yellow fluid was removed. Samples were sent to the laboratory as requested by the clinical team. IMPRESSION: Successful ultrasound guided diagnostic and therapeutic left thoracentesis yielding 70 mL of pleural fluid. Read by: Brynda Greathouse PA-C Electronically Signed   By:  Markus Daft M.D.   On: 11/26/2017 14:15        Scheduled Meds: . atorvastatin  20 mg Oral q1800  . carvedilol  3.125 mg Oral BID  . fenofibrate  54 mg Oral Daily  . guaiFENesin  600 mg Oral BID  . insulin aspart  0-5 Units Subcutaneous QHS  . insulin aspart  0-9 Units Subcutaneous TID WC  . insulin aspart  4 Units Subcutaneous TID WC  . insulin glargine  22 Units Subcutaneous QHS  . ipratropium-albuterol  3 mL Nebulization BID  . lidocaine      . mouth rinse  15 mL Mouth Rinse BID  . methylPREDNISolone (SOLU-MEDROL) injection  40 mg Intravenous Daily  . Warfarin - Pharmacist Dosing Inpatient   Does not apply q1800   Continuous Infusions: . cefTRIAXone (ROCEPHIN)  IV Stopped (11/25/17 1822)     LOS: 6 days    Time spent: 68    Tanaisha Pittman A, MD [facility] Pager [phone]  If 7PM-7AM, please contact night-coverage [email or web address] Password [email or web address] 11/26/2017, 2:40 PM

## 2017-11-26 NOTE — Progress Notes (Signed)
[facility] for Warfarin Indication: atrial fibrillation  Allergies  Allergen Reactions  . Altace [Ramipril] Other (See Comments)    "throat felt like had a knot in it"  . Codeine Nausea And Vomiting    Nausea and vomiting   . Simvastatin Other (See Comments)    Leg aches    Patient Measurements: Height: 5\' 10"  (177.8 cm) Weight: 138 lb 12.8 oz (63 kg) IBW/kg (Calculated) : 73  Vital Signs: Temp: 96.3 F (35.7 C) (06/07 0820) Temp Source: Axillary (06/07 0820) BP: 132/53 (06/07 0820) Pulse Rate: 61 (06/07 0820)  Labs: Recent Labs    11/24/17 0306 11/25/17 0532 11/26/17 0403  HGB 9.9* 9.4* 9.8*  HCT 29.5* 27.4* 28.9*  PLT 463* 507* 550*  LABPROT 36.1* 23.1* 17.4*  INR 3.66 2.07 1.44  CREATININE 0.96 0.97 0.91    Estimated Creatinine Clearance: 54.8 mL/min (by C-G formula based on SCr of 0.91 mg/dL).  Assessment: 84 y.o. male admitted on 11/20/2017 with pneumonia. on warfarin PTA for AFib- home dose is 5mg  daily except 7.5mg  on Saturdays, admit INR 2.01. Warfarin was reversed with Vit K 5 mg on 6/5 for thoracentesis - done 6/6 and 6/7. Per CCM note, okay to resume warfarin post-thoracentesis today.   INR today is SUBtherapeutic (1.44 << 2.07, goal of 2-3). CBC wnl.   Goal of Therapy:  INR 2-3 Monitor platelets by anticoagulation protocol: Yes   Plan: 1. Warfarin 5 mg x 1 at 1800 today 2. Will continue to monitor for any signs/symptoms of bleeding and will follow up with PT/INR in the a.m.   Thank you for allowing pharmacy to be a part of this patient's care.  Alycia Rossetti, PharmD, BCPS Clinical Pharmacist Pager: [phone] Clinical phone for 11/26/2017 from 7a-3:30p: [phone] If after 3:30p, please call main pharmacy at: [phone] 11/26/2017 3:30 PM

## 2017-11-26 NOTE — Progress Notes (Signed)
PT Cancellation Note  Patient Details Name: Oscar Baker MRN: [id number] DOB: Apr 03, 1933   Cancelled Treatment:    Reason Eval/Treat Not Completed: (P) Patient at procedure or test/unavailable Pt having thoracentesis. PT will follow back next week.  Riyana Biel B. Migdalia Dk PT, DPT Acute Rehabilitation  [phone] Pager [phone]      [place] 11/26/2017, 12:52 PM

## 2017-11-26 NOTE — Progress Notes (Signed)
  Speech Language Pathology Treatment: Dysphagia  Patient Details Name: Oscar Baker MRN: [id number] DOB: [birth year] Today's Date: 11/26/2017 Time: 8144-8185 SLP Time Calculation (min) (ACUTE ONLY): 16 min  Assessment / Plan / Recommendation Clinical Impression  Pt continues to have complaints about using the thickener, wanting to only drink V8 juice on his trays and not allowing thickener in his prune juice. SLP provided cup sips of water with Min cues for smaller pacing with delayed coughing still noted, concerning for reduced airway protection. Pt does not want to take smaller sips because he feels like he is not getting enough liquid at a time. Results of MBS were reiterated. Also discussed with pt about the importance of consistency whether continues with nectar thick liquids or advances to thin liquids with use of compensatory strategies. Pt seems adamant to do what he wants to do, not wanting to make modifications to how or what he is swallowing. Education was reinforced about good oral care. Would continue current diet for now.   HPI HPI: Pt is an 84 year-old male with a history of CLL, prostate cancer, sick sinus syndrome, pacemaker, paroxysmal atrial fibrillation, CHF with ejection fraction 45% admitted with LLL PNA. Per consult note from critical care, pt/wife describe cyclic cough for over a year, with coughing sometimes escalating during meals, especially with dry foods.      SLP Plan  Continue with current plan of care       Recommendations  Diet recommendations: Dysphagia 3 (mechanical soft);Nectar-thick liquid;Other(comment)(ice chips between meals after oral care) Liquids provided via: Cup Medication Administration: Whole meds with puree Supervision: Patient able to self feed;Intermittent supervision to cue for compensatory strategies Compensations: Slow rate;Small sips/bites Postural Changes and/or Swallow Maneuvers: Seated upright 90 degrees                Oral  Care Recommendations: Oral care QID;Oral care prior to ice chip/H20 Follow up Recommendations: Outpatient SLP;Home health SLP SLP Visit Diagnosis: Dysphagia, oropharyngeal phase (R13.12) Plan: Continue with current plan of care       GO                [name] 11/26/2017, 11:08 AM  [name], M.A. CCC-SLP [phone]

## 2017-11-26 NOTE — Progress Notes (Signed)
Physical Therapy Treatment Patient Details Name: Oscar Baker MRN: [id number] DOB: 07-Sep-1932 Today's Date: 11/26/2017    History of Present Illness 84 year old male with a history of CLL, prostate cancer, sick sinus syndrome, pacemaker, paroxysmal atrial fibrillation, CHF with ejection fraction 45% admitted with fever increasing shortness of breath cough with yellow to green phlegm for 1 week prior to admission.  He was found to have  left lower lobe pneumonia with associated effusion    PT Comments    Pt making progress towards goals, however continues to be limited in safe mobility by oxygen desaturation and increased fatigue. Pt currently mod I for bed mobility, minA for power up into standing and min guard for ambulation of 250 feet. D/c plans remain appropriate at this time. PT will continue to follow acutely to progress strength and endurance.     Follow Up Recommendations  Outpatient PT;Supervision/Assistance - 24 hour     Equipment Recommendations  Other (comment)(pt came with RW and it has been lost in hospital may need ne)    Recommendations for Other Services       Precautions / Restrictions Precautions Precautions: Fall Restrictions Weight Bearing Restrictions: No    Mobility  Bed Mobility Overal bed mobility: Modified Independent(requires increased time and effort)             General bed mobility comments: utilizes bedrail to pull to EoB  Transfers Overall transfer level: Needs assistance   Transfers: Sit to/from Stand Sit to Stand: Min assist         General transfer comment: minA for power up and steadying, required 2x attempts, 2nd with minA   Ambulation/Gait Ambulation/Gait assistance: Min guard Ambulation Distance (Feet): 250 Feet Assistive device: Rolling walker (2 wheeled) Gait Pattern/deviations: Step-through pattern;Decreased stride length;Trunk flexed;Shuffle Gait velocity: slowed Gait velocity interpretation: <1.8 ft/sec, indicate of  risk for recurrent falls General Gait Details: hands-on min guard for safety, vc for upright posture, slow, gait with increased fatigue and more reliance on RW as pt progressed Pt with 3/4 DoE by end of ambulation           Balance Overall balance assessment: Needs assistance Sitting-balance support: No upper extremity supported;Feet supported Sitting balance-Leahy Scale: Fair     Standing balance support: Bilateral upper extremity supported Standing balance-Leahy Scale: Poor Standing balance comment: requires B UE support                             Cognition Arousal/Alertness: Awake/alert Behavior During Therapy: WFL for tasks assessed/performed Overall Cognitive Status: Within Functional Limits for tasks assessed                                           General Comments General comments (skin integrity, edema, etc.): Pt on 3 L O2 via nasal cannula, SaO2 98%O2 at rest, with ambulation SaO2 decreased to 88%O2 requiring standing rest break and vc for pursed lipped breathing to recover to 92%O2      Pertinent Vitals/Pain Pain Assessment: No/denies pain           PT Goals (current goals can now be found in the care plan section) Acute Rehab PT Goals Patient Stated Goal: feel less tired PT Goal Formulation: With patient Time For Goal Achievement: 12/06/17 Potential to Achieve Goals: Fair Progress towards PT goals: Progressing toward goals  Frequency    Min 3X/week      PT Plan Current plan remains appropriate       AM-PAC PT "6 Clicks" Daily Activity  Outcome Measure  Difficulty turning over in bed (including adjusting bedclothes, sheets and blankets)?: A Lot Difficulty moving from lying on back to sitting on the side of the bed? : A Lot Difficulty sitting down on and standing up from a chair with arms (e.g., wheelchair, bedside commode, etc,.)?: Unable Help needed moving to and from a bed to chair (including a wheelchair)?: A  Little Help needed walking in hospital room?: A Little Help needed climbing 3-5 steps with a railing? : A Lot 6 Click Score: 13    End of Session Equipment Utilized During Treatment: Gait belt;Oxygen Activity Tolerance: Patient tolerated treatment well Patient left: in chair;with call bell/phone within reach;with chair alarm set;with nursing/sitter in room Nurse Communication: Mobility status;Patient requests pain meds PT Visit Diagnosis: Unsteadiness on feet (R26.81);Other abnormalities of gait and mobility (R26.89);Muscle weakness (generalized) (M62.81);Difficulty in walking, not elsewhere classified (R26.2);Repeated falls (R29.6);History of falling (Z91.81);Pain     Time: 5427-0623 PT Time Calculation (min) (ACUTE ONLY): 14 min  Charges:  $Gait Training: 8-22 mins                    G Codes:       Donathan Buller B. Migdalia Dk PT, DPT Acute Rehabilitation  [phone] Pager [phone]     [place] 11/26/2017, 4:13 PM

## 2017-11-26 NOTE — Procedures (Signed)
PROCEDURE SUMMARY:  Successful US guided diagnostic and therapeutic thoracentesis. Yielded 70 mL of cloudy, yellow fluid.   Pt tolerated procedure well, however were not able to aspirate more than 70 mL despite re-attempt.  Unsure if septations/webbing/pocketing of fluid present.  Nothing discrete visible during ultrasound. No immediate complications.  Specimen was sent for labs. CXR ordered.  Docia Barrier PA-C 11/26/2017 2:02 PM

## 2017-11-26 NOTE — Progress Notes (Signed)
Results for ABOU, STERKEL (MRN [id number]) as of 11/26/2017 11:04  Ref. Range 11/25/2017 07:58 11/25/2017 12:00 11/25/2017 16:50 11/25/2017 21:12 11/26/2017 08:18  Glucose-Capillary Latest Ref Range: 65 - 99 mg/dL 318 (H) 227 (H) 267 (H) 307 (H) 232 (H)  Noted that blood sugars continue to be greater than 200 mg/dl. Recommend increasing Lantus to 26 units daily (increase by 20%) and increase meal coverage to 6 units TID if eating at least 50% of meals and while on steroids.   Harvel Ricks RN BSN CDE Diabetes Coordinator Pager: [phone]  8am-5pm

## 2017-11-27 ENCOUNTER — Inpatient Hospital Stay (HOSPITAL_COMMUNITY): Payer: Medicare Other

## 2017-11-27 DIAGNOSIS — J869 Pyothorax without fistula: Secondary | ICD-10-CM

## 2017-11-27 LAB — URINALYSIS, ROUTINE W REFLEX MICROSCOPIC
Bacteria, UA: NONE SEEN
Bilirubin Urine: NEGATIVE
Glucose, UA: >=500 mg/dL — AB
Ketones, ur: NEGATIVE mg/dL
Nitrite: NEGATIVE
Protein, ur: NEGATIVE mg/dL
Specific Gravity, Urine: 1.018 (ref 1.005–1.030)
WBC, UA: >50 WBC/hpf — ABNORMAL HIGH (ref 0–5)
pH: 7 (ref 5.0–8.0)

## 2017-11-27 LAB — CBC WITH DIFFERENTIAL/PLATELET
BASOS PCT: 0 %
Basophils Absolute: 0 10*3/uL (ref 0.0–0.1)
EOS PCT: 0 %
Eosinophils Absolute: 0 10*3/uL (ref 0.0–0.7)
HEMATOCRIT: 30.2 % — AB (ref 39.0–52.0)
HEMOGLOBIN: 10.2 g/dL — AB (ref 13.0–17.0)
LYMPHS PCT: 71 %
Lymphs Abs: 26.8 10*3/uL — ABNORMAL HIGH (ref 0.7–4.0)
MCH: 32.4 pg (ref 26.0–34.0)
MCHC: 33.8 g/dL (ref 30.0–36.0)
MCV: 95.9 fL (ref 78.0–100.0)
Monocytes Absolute: 1.1 10*3/uL — ABNORMAL HIGH (ref 0.1–1.0)
Monocytes Relative: 3 %
Neutro Abs: 9.8 10*3/uL — ABNORMAL HIGH (ref 1.7–7.7)
Neutrophils Relative %: 26 %
Platelets: 631 10*3/uL — ABNORMAL HIGH (ref 150–400)
RBC: 3.15 MIL/uL — ABNORMAL LOW (ref 4.22–5.81)
RDW: 18.6 % — ABNORMAL HIGH (ref 11.5–15.5)
WBC: 37.7 10*3/uL — ABNORMAL HIGH (ref 4.0–10.5)

## 2017-11-27 LAB — BASIC METABOLIC PANEL
ANION GAP: 10 (ref 5–15)
BUN: 31 mg/dL — ABNORMAL HIGH (ref 6–20)
CHLORIDE: 96 mmol/L — AB (ref 101–111)
CO2: 31 mmol/L (ref 22–32)
Calcium: 8.9 mg/dL (ref 8.9–10.3)
Creatinine, Ser: 0.95 mg/dL (ref 0.61–1.24)
GFR calc Af Amer: >60 mL/min (ref >60)
GLUCOSE: 180 mg/dL — AB (ref 65–99)
POTASSIUM: 4.4 mmol/L (ref 3.5–5.1)
Sodium: 137 mmol/L (ref 135–145)

## 2017-11-27 LAB — GLUCOSE, CAPILLARY
Glucose-Capillary: 178 mg/dL — ABNORMAL HIGH (ref 65–99)
Glucose-Capillary: 294 mg/dL — ABNORMAL HIGH (ref 65–99)
Glucose-Capillary: 325 mg/dL — ABNORMAL HIGH (ref 65–99)

## 2017-11-27 LAB — PROTIME-INR
INR: 1.44
Prothrombin Time: 17.4 seconds — ABNORMAL HIGH (ref 11.4–15.2)

## 2017-11-27 LAB — CULTURE, BLOOD (ROUTINE X 2)

## 2017-11-27 LAB — SURGICAL PCR SCREEN
MRSA, PCR: NEGATIVE
Staphylococcus aureus: NEGATIVE

## 2017-11-27 MED ORDER — WARFARIN - PHARMACIST DOSING INPATIENT: Freq: Every day | Status: DC

## 2017-11-27 NOTE — Progress Notes (Signed)
Patient ID: Oscar Baker, male   DOB: 02-06-1933, 84 y.o.   MRN: [id number] Patient ID: Oscar Baker, male   DOB: [birth year], 84 y.o.   MRN: [id number]  PROGRESS NOTE    Oscar Baker  GUR:[id number] DOB: 05-04-1933 DOA: 11/20/2017 PCP: [name], MD     Brief Narrative:   84 year-old male with a history of CLL, prostate cancer, sick sinus syndrome, pacemaker, paroxysmal atrial fibrillation, CHF with ejection fraction 45% admitted with fever increasing shortness of breath cough with yellow to green phlegm for 1 week prior to admission. He was found to have left lower lobe pneumonia with associated effusion and is admitted for the treatment of the same.  Thoracentesis was attempted on 11/25/2017 by pulmonology service but had to be aborted due to coughing.  IR did thoracentesis on 11/26/2017 with only 70 cc fluid obtained.    Assessment & Plan:   Principal Problem:   HCAP (healthcare-associated pneumonia) Active Problems:   HTN (hypertension)   Hyperlipidemia   Diabetes mellitus type 2, noninsulin dependent (HCC)   LPRD (laryngopharyngeal reflux disease)   Other allergic rhinitis   Anemia   Chronic cough   CLL (chronic lymphocytic leukemia) (HCC)   Pleural effusion   Pneumonia of left lower lobe due to infectious organism [facility])   Mediastinal lymphadenopathy   Hypoxemia  1]HCAP/H influenza bacteremia with loculated pleural effusion on the left-patient improving clinically every day.  Currently on ceftriaxone 2 g IV every 24 hours for H influenza bacteremia. Blood culture came back positive for H. influenzae.  Repeat blood cultures negative thus far except for likely contaminant. leukocytosis secondary to CLL.   Steroids were started and managed by pulmonary who are consulted and are following.  Follow-up on pleural fluid cultures also pending.  Discussed case with pulmonary team today who are recommending CT surgery evaluation for loculated pleural effusion.  Spoke to Dr.  Darcey Nora.  2]paroxysmal atrial fibrillation with CHF continue carvedilol and Coumadin was on hold for tap.   Resumed on 11/26/2017. Increase the dose of Lasix to 40 mg IV twice daily for 3 days and then placed back on home dose of 20 mg orally daily.  Chest x-ray done yesterday and today noted with left sided infiltrates and opacity and pleural effusion.   Echocardiogram with ejection fraction 45 to 50% January 2019.  3]history of hypertension continue carvedilol and Lasix.  4]type 2 diabetes  blood sugar has been elevated.  Increasing sliding scale needs and Lantus 20 to 22 units.  On steroids.  Control is improving.  5]hyponatremia-Secondary to pneumonia follow-up levels.   Improving slowly  6) high aspiration risk-patient is not happy about thickening his liquids.  Speech therapy and dietitian consulted.    DVT prophylaxis: SCDs and Coumadin on hold for now code Status: DNR  family Communication: Wife  disposition Plan: Per pulmonary team  Consultants:   Pulmonology  CT surgery  Procedures: Thoracentesis    Antimicrobials:   Rocephin 2 g IV every 24 hours   Subjective: Patient is ready to go home.  Not much fluid was drawn off his thoracentesis yesterday by IR.  His breathing is a little bit better.  Objective: Vitals:   11/27/17 0737 11/27/17 0800 11/27/17 0832 11/27/17 1100  BP: 109/71   115/71  Pulse: 72   76  Resp: 20   (!) 34  Temp: 97.9 F (36.6 C)   97.8 F (36.6 C)  TempSrc: Oral   Oral  SpO2: 94% 92% 93%  90%  Weight:      Height:        Intake/Output Summary (Last 24 hours) at 11/27/2017 1154 Last data filed at 11/27/2017 1015 Gross per 24 hour  Intake 1240 ml  Output 2800 ml  Net -1560 ml   Filed Weights   11/20/17 2100 11/21/17 0537  Weight: 97.1 kg (214 lb 1.1 oz) 63 kg (138 lb 12.8 oz)    Examination:  General exam: Appears calm and comfortable  Respiratory system: Clear to auscultation. Respiratory effort normal.   much improved  breath sounds on the left but still decreased. Cardiovascular system: S1 & S2 heard, RRR. No JVD, murmurs, rubs, gallops or clicks. No pedal edema. Gastrointestinal system: Abdomen is nondistended, soft and nontender. No organomegaly or masses felt. Normal bowel sounds heard. Central nervous system: Alert and oriented. No focal neurological deficits. Extremities: Symmetric 5 x 5 power. Skin: No rashes, lesions or ulcers Psychiatry: Judgement and insight appear normal. Mood & affect appropriate.     Data Reviewed: I have personally reviewed following labs and imaging studies  CBC: Recent Labs  Lab 11/20/17 1339  11/23/17 0218 11/24/17 0306 11/25/17 0532 11/26/17 0403 11/27/17 0240  WBC 35.5*   < > 60.0* 46.7* 43.6* 37.3* 37.7*  NEUTROABS 12.8*  --   --   --  19.6* 13.4* 9.8*  HGB 11.0*   < > 10.8* 9.9* 9.4* 9.8* 10.2*  HCT 33.3*   < > 33.1* 29.5* 27.4* 28.9* 30.2*  MCV 99.4   < > 98.2 96.1 94.8 96.0 95.9  PLT 372   < > 455* 463* 507* 550* 631*   < > = values in this interval not displayed.   Basic Metabolic Panel: Recent Labs  Lab 11/22/17 0241 11/23/17 0218 11/24/17 0306 11/24/17 0930 11/25/17 0532 11/26/17 0403 11/27/17 0240  NA 130* 131* 132*  --  135 136 137  K 4.4 4.6 4.8  --  4.6 4.4 4.4  CL 99* 98* 96*  --  97* 100* 96*  CO2 22 24 28   --  29 29 31   GLUCOSE 190* 226* 350* 508* 297* 238* 180*  BUN 17 19 22*  --  31* 32* 31*  CREATININE 0.82 0.94 0.96  --  0.97 0.91 0.95  CALCIUM 8.2* 8.7* 8.6*  --  8.7* 8.7* 8.9  MG 1.9  --   --   --   --   --   --    GFR: Estimated Creatinine Clearance: 52.5 mL/min (by C-G formula based on SCr of 0.95 mg/dL). Liver Function Tests: Recent Labs  Lab 11/20/17 1339 11/21/17 0226 11/25/17 1922  AST 36 24  --   ALT 26 21  --   ALKPHOS 72 62  --   BILITOT 1.1 1.6*  --   PROT 6.8 5.7* 5.9*  ALBUMIN 3.2* 2.6*  --    No results for input(s): LIPASE, AMYLASE in the last 168 hours. No results for input(s): AMMONIA in the last  168 hours. Coagulation Profile: Recent Labs  Lab 11/23/17 0218 11/24/17 0306 11/25/17 0532 11/26/17 0403 11/27/17 0240  INR 3.64 3.66 2.07 1.44 1.44   Cardiac Enzymes: Recent Labs  Lab 11/20/17 1831  TROPONINI <0.03   BNP (last 3 results) No results for input(s): PROBNP in the last 8760 hours. HbA1C: No results for input(s): HGBA1C in the last 72 hours. CBG: Recent Labs  Lab 11/26/17 1337 11/26/17 1629 11/26/17 2105 11/27/17 0735 11/27/17 1145  GLUCAP 198* 337* 274* 178* 294*  Lipid Profile: No results for input(s): CHOL, HDL, LDLCALC, TRIG, CHOLHDL, LDLDIRECT in the last 72 hours. Thyroid Function Tests: No results for input(s): TSH, T4TOTAL, FREET4, T3FREE, THYROIDAB in the last 72 hours. Anemia Panel: No results for input(s): VITAMINB12, FOLATE, FERRITIN, TIBC, IRON, RETICCTPCT in the last 72 hours. Sepsis Labs: Recent Labs  Lab 11/20/17 1353 11/20/17 1643  LATICACIDVEN 1.38 2.20*    Recent Results (from the past 240 hour(s))  Blood culture (routine x 2)     Status: Abnormal   Collection Time: 11/20/17  4:30 PM  Result Value Ref Range Status   Specimen Description BLOOD RIGHT ANTECUBITAL  Final   Special Requests   Final    BOTTLES DRAWN AEROBIC AND ANAEROBIC Blood Culture adequate volume   Culture  Setup Time   Final    GRAM NEGATIVE RODS IN BOTH AEROBIC AND ANAEROBIC BOTTLES CRITICAL RESULT CALLED TO, READ BACK BY AND VERIFIED WITH: M MACCIA,PHARMD AT 1547 11/21/17 BY L BENFIELD    Culture (A)  Final    HAEMOPHILUS INFLUENZAE BETA LACTAMASE POSITIVE HEALTH DEPARTMENT NOTIFIED Performed at [facility], [place] [address] [place], [place] [address]    Report Status 11/24/2017 FINAL  Final  Blood culture (routine x 2)     Status: Abnormal (Preliminary result)   Collection Time: 11/20/17  4:35 PM  Result Value Ref Range Status   Specimen Description BLOOD RIGHT HAND  Final   Special Requests   Final    BOTTLES DRAWN AEROBIC AND ANAEROBIC Blood  Culture adequate volume   Culture  Setup Time   Final    GRAM NEGATIVE RODS IN BOTH AEROBIC AND ANAEROBIC BOTTLES CRITICAL RESULT CALLED TO, READ BACK BY AND VERIFIED WITH: M MACCIA,PHARMD AT 1547 11/21/17 BY L BENFIELD    Culture (A)  Final    HAEMOPHILUS INFLUENZAE BETA LACTAMASE POSITIVE HEALTH DEPARTMENT NOTIFIED Referred to [facility] in [place], [place] for Serotyping. Performed at [facility], [place] [address] [place],  [address]    Report Status PENDING  Incomplete  Blood Culture ID Panel (Reflexed)     Status: Abnormal   Collection Time: 11/20/17  4:35 PM  Result Value Ref Range Status   Enterococcus species NOT DETECTED NOT DETECTED Final   Listeria monocytogenes NOT DETECTED NOT DETECTED Final   Staphylococcus species NOT DETECTED NOT DETECTED Final   Staphylococcus aureus NOT DETECTED NOT DETECTED Final   Streptococcus species NOT DETECTED NOT DETECTED Final   Streptococcus agalactiae NOT DETECTED NOT DETECTED Final   Streptococcus pneumoniae NOT DETECTED NOT DETECTED Final   Streptococcus pyogenes NOT DETECTED NOT DETECTED Final   Acinetobacter baumannii NOT DETECTED NOT DETECTED Final   Enterobacteriaceae species NOT DETECTED NOT DETECTED Final   Enterobacter cloacae complex NOT DETECTED NOT DETECTED Final   Escherichia coli NOT DETECTED NOT DETECTED Final   Klebsiella oxytoca NOT DETECTED NOT DETECTED Final   Klebsiella pneumoniae NOT DETECTED NOT DETECTED Final   Proteus species NOT DETECTED NOT DETECTED Final   Serratia marcescens NOT DETECTED NOT DETECTED Final   Haemophilus influenzae DETECTED (A) NOT DETECTED Final    Comment: CRITICAL RESULT CALLED TO, READ BACK BY AND VERIFIED WITH: M MACCIA,PHARMD AT 1547 11/21/17 BY L BENFIELD    Neisseria meningitidis NOT DETECTED NOT DETECTED Final   Pseudomonas aeruginosa NOT DETECTED NOT DETECTED Final   Candida albicans NOT DETECTED NOT DETECTED Final   Candida glabrata NOT  DETECTED NOT DETECTED Final   Candida krusei NOT  DETECTED NOT DETECTED Final   Candida parapsilosis NOT DETECTED NOT DETECTED Final   Candida tropicalis NOT DETECTED NOT DETECTED Final    Comment: Performed at [facility], [place] [address] [place], [place] [address]  Culture, sputum-assessment     Status: None   Collection Time: 11/20/17  6:32 PM  Result Value Ref Range Status   Specimen Description EXPECTORATED SPUTUM  Final   Special Requests Normal  Final   Sputum evaluation   Final    THIS SPECIMEN IS ACCEPTABLE FOR SPUTUM CULTURE Performed at [facility], [address] [place], [place] [address]    Report Status 11/20/2017 FINAL  Final  Culture, respiratory (NON-Expectorated)     Status: None   Collection Time: 11/20/17  6:32 PM  Result Value Ref Range Status   Specimen Description EXPECTORATED SPUTUM  Final   Special Requests Normal Reflexed from [id number]  Final   Gram Stain   Final    MODERATE WBC PRESENT, PREDOMINANTLY PMN RARE SQUAMOUS EPITHELIAL CELLS PRESENT ABUNDANT GRAM POSITIVE COCCI IN PAIRS IN CLUSTERS MODERATE GRAM NEGATIVE RODS    Culture   Final    Consistent with normal respiratory flora. Performed at [facility], [place] [address], [place], [place] [address]    Report Status 11/23/2017 FINAL  Final  MRSA PCR Screening     Status: None   Collection Time: 11/20/17  8:28 PM  Result Value Ref Range Status   MRSA by PCR NEGATIVE NEGATIVE Final    Comment:        The GeneXpert MRSA Assay (FDA approved for NASAL specimens only), is one component of a comprehensive MRSA colonization surveillance program. It is not intended to diagnose MRSA infection nor to guide or monitor treatment for MRSA infections. Performed at [facility], [place] [address] [place], [place] [address]   Culture, blood (routine x 2)     Status: None (Preliminary result)   Collection Time: 11/24/17  1:00 PM  Result Value Ref Range Status   Specimen Description  BLOOD LEFT ANTECUBITAL  Final   Special Requests   Final    BOTTLES DRAWN AEROBIC AND ANAEROBIC Blood Culture adequate volume   Culture   Final    NO GROWTH 2 DAYS Performed at [facility], [place] [address], [place], [place] [address]    Report Status PENDING  Incomplete  Culture, blood (routine x 2)     Status: Abnormal   Collection Time: 11/24/17  1:09 PM  Result Value Ref Range Status   Specimen Description BLOOD RIGHT ANTECUBITAL  Final   Special Requests   Final    BOTTLES DRAWN AEROBIC ONLY Blood Culture results may not be optimal due to an inadequate volume of blood received in culture bottles   Culture  Setup Time   Final    GRAM POSITIVE COCCI AEROBIC BOTTLE ONLY CRITICAL RESULT CALLED TO, READ BACK BY AND VERIFIED WITH: Ferne Coe PharmD 10:25 11/25/17 ([email or web address])    Culture (A)  Final    STAPHYLOCOCCUS SPECIES (COAGULASE NEGATIVE) THE SIGNIFICANCE OF ISOLATING THIS ORGANISM FROM A SINGLE SET OF BLOOD CULTURES WHEN MULTIPLE SETS ARE DRAWN IS UNCERTAIN. PLEASE NOTIFY THE MICROBIOLOGY DEPARTMENT WITHIN ONE WEEK IF SPECIATION AND SENSITIVITIES ARE REQUIRED. Performed at [facility], [place] [address] [place], [place] [address]    Report Status 11/27/2017 FINAL  Final  Blood Culture ID Panel (Reflexed)     Status: Abnormal   Collection Time: 11/24/17  1:09 PM  Result Value Ref Range Status   Enterococcus species NOT DETECTED NOT DETECTED Final   Listeria monocytogenes NOT DETECTED NOT DETECTED Final   Staphylococcus species DETECTED (A) NOT DETECTED Final    Comment: Methicillin (oxacillin) resistant coagulase negative staphylococcus. Possible blood culture contaminant (unless isolated from more than one blood culture draw or clinical case suggests pathogenicity). No antibiotic treatment is indicated for blood  culture contaminants. CRITICAL RESULT CALLED TO, READ BACK BY AND VERIFIED WITH: Ferne Coe PharmD 10:25 11/25/17 ([email or web address])    Staphylococcus aureus NOT DETECTED  NOT DETECTED Final   Methicillin resistance DETECTED (A) NOT DETECTED Final    Comment: CRITICAL RESULT CALLED TO, READ BACK BY AND VERIFIED WITH: Ferne Coe PharmD 10:25 11/25/17 ([email or web address])    Streptococcus species NOT DETECTED NOT DETECTED Final   Streptococcus agalactiae NOT DETECTED NOT DETECTED Final   Streptococcus pneumoniae NOT DETECTED NOT DETECTED Final   Streptococcus pyogenes NOT DETECTED NOT DETECTED Final   Acinetobacter baumannii NOT DETECTED NOT DETECTED Final   Enterobacteriaceae species NOT DETECTED NOT DETECTED Final   Enterobacter cloacae complex NOT DETECTED NOT DETECTED Final   Escherichia coli NOT DETECTED NOT DETECTED Final   Klebsiella oxytoca NOT DETECTED NOT DETECTED Final   Klebsiella pneumoniae NOT DETECTED NOT DETECTED Final   Proteus species NOT DETECTED NOT DETECTED Final   Serratia marcescens NOT DETECTED NOT DETECTED Final   Haemophilus influenzae NOT DETECTED NOT DETECTED Final   Neisseria meningitidis NOT DETECTED NOT DETECTED Final   Pseudomonas aeruginosa NOT DETECTED NOT DETECTED Final   Candida albicans NOT DETECTED NOT DETECTED Final   Candida glabrata NOT DETECTED NOT DETECTED Final   Candida krusei NOT DETECTED NOT DETECTED Final   Candida parapsilosis NOT DETECTED NOT DETECTED Final   Candida tropicalis NOT DETECTED NOT DETECTED Final    Comment: Performed at [facility], [address], [place], [place] [address]  Gram stain     Status: None   Collection Time: 11/25/17  3:43 PM  Result Value Ref Range Status   Specimen Description FLUID PLEURAL  Final   Special Requests NONE  Final   Gram Stain   Final    ABUNDANT WBC PRESENT,BOTH PMN AND MONONUCLEAR NO ORGANISMS SEEN Performed at [facility], [address], [place], [place] [address]    Report Status 11/25/2017 FINAL  Final  Body fluid culture     Status: None (Preliminary result)   Collection Time: 11/26/17  1:01 PM  Result Value Ref Range Status   Specimen Description  PLEURAL FLUID LEFT  Final   Special Requests NONE  Final   Gram Stain   Final    MODERATE WBC PRESENT,BOTH PMN AND MONONUCLEAR NO ORGANISMS SEEN    Culture   Final    NO GROWTH < 24 HOURS Performed at [facility], [place] [address], [place],  [address]    Report Status PENDING  Incomplete         Radiology Studies: Dg Chest 1 View  Result Date: 11/26/2017 CLINICAL DATA:  Status post thoracentesis. EXAM: CHEST  1 VIEW COMPARISON:  Yesterday. FINDINGS: Significant decrease in amount of left pleural fluid following thoracentesis. No pneumothorax. Stable enlarged cardiac silhouette. Dense airspace opacity at the left lung base, increased. Clear right lung with an overlying skin fold. Bilateral shoulder degenerative changes. IMPRESSION: 1. No pneumothorax following left thoracentesis. 2. Dense left lower lobe atelectasis or pneumonia, significantly increased. 3. Stable cardiomegaly. Electronically Signed   By:  Claudie Revering M.D.   On: 11/26/2017 13:06   Dg Chest Port 1 View  Result Date: 11/27/2017 CLINICAL DATA:  Pneumonia EXAM: PORTABLE CHEST 1 VIEW COMPARISON:  November 26, 2017 FINDINGS: There is layering pleural effusion on the left. There is consolidation in the left base. There is mild right base atelectasis. Right lung otherwise is clear. Heart is mildly enlarged with pulmonary vascularity normal. Pacemaker lead attached to right ventricle. No pneumothorax. No adenopathy. No bone lesions. IMPRESSION: Layering pleural effusion on the left with persistent consolidation left base. Mild right base atelectasis. Stable cardiac prominence. Electronically Signed   By: Lowella Grip III M.D.   On: 11/27/2017 09:27   Dg Chest Port 1 View  Result Date: 11/25/2017 CLINICAL DATA:  84 year old male with shortness of breath and pleural effusion. EXAM: PORTABLE CHEST 1 VIEW COMPARISON:  11/23/2017 CT, chest radiograph and prior studies FINDINGS: Cardiomediastinal silhouette is unchanged. A LEFT  pacemaker again noted. LEFT pleural effusion and LEFT LOWER lung consolidation/atelectasis again noted. The RIGHT lung is clear. No pneumothorax or acute bony abnormality. IMPRESSION: Little significant change in appearance of the chest with continued LEFT pleural effusion and LEFT LOWER lung atelectasis/consolidation. Electronically Signed   By: Margarette Canada M.D.   On: 11/25/2017 16:53   Ir Thoracentesis Asp Pleural Space W/img Guide  Result Date: 11/26/2017 INDICATION: Patient with left pleural effusion. Request is made for diagnostic and therapeutic thoracentesis. EXAM: ULTRASOUND GUIDED DIAGNOSTIC AND THERAPEUTIC LEFT THORACENTESIS MEDICATIONS: 10 mL 2% lidocaine COMPLICATIONS: None immediate. PROCEDURE: An ultrasound guided thoracentesis was thoroughly discussed with the patient and questions answered. The benefits, risks, alternatives and complications were also discussed. The patient understands and wishes to proceed with the procedure. Written consent was obtained. Ultrasound was performed to localize and mark an adequate pocket of fluid in the left chest. The area was then prepped and draped in the normal sterile fashion. 2% lidocaine was used for local anesthesia. Under ultrasound guidance a Safe-T-Centesis catheter was introduced. Thoracentesis was performed. The catheter was removed and a dressing applied. FINDINGS: A total of approximately 70 mL of cloudy yellow fluid was removed. Samples were sent to the laboratory as requested by the clinical team. IMPRESSION: Successful ultrasound guided diagnostic and therapeutic left thoracentesis yielding 70 mL of pleural fluid. Read by: Brynda Greathouse PA-C Electronically Signed   By: Markus Daft M.D.   On: 11/26/2017 14:15        Scheduled Meds: . atorvastatin  20 mg Oral q1800  . carvedilol  3.125 mg Oral BID  . fenofibrate  54 mg Oral Daily  . furosemide  20 mg Oral Daily  . guaiFENesin  600 mg Oral BID  . insulin aspart  0-5 Units Subcutaneous  QHS  . insulin aspart  0-9 Units Subcutaneous TID WC  . insulin aspart  4 Units Subcutaneous TID WC  . insulin glargine  22 Units Subcutaneous QHS  . ipratropium-albuterol  3 mL Nebulization BID  . mouth rinse  15 mL Mouth Rinse BID  . methylPREDNISolone (SOLU-MEDROL) injection  40 mg Intravenous Daily  . Warfarin - Pharmacist Dosing Inpatient   Does not apply q1800   Continuous Infusions: . cefTRIAXone (ROCEPHIN)  IV Stopped (11/26/17 1758)     LOS: 7 days    Time spent: 34    Judd Mccubbin A, MD [facility] Pager [phone]  If 7PM-7AM, please contact night-coverage [email or web address] Password [facility] 11/27/2017, 11:54 AM  Patient ID: Oscar Baker, male   DOB: March 20, 1933, 84 y.o.  MRN: [id number]

## 2017-11-27 NOTE — Consult Note (Signed)
[address]       ,[place] [address]             [phone]        Oscar Baker [place] Medical Record #[id number] Date of Birth: 07/02/1932  Referring:[facility]  Primary Care: [name], MD Primary Cardiologist:Philip Nahser, MD  Chief Complaint:    Chief Complaint  Patient presents with  . Cough  . Fever  Patient examined, most recent chest x-rays and CT scan of chest images personally reviewed and discussed with patient and wife  History of Present Illness:     84 year old male with progressive decline in health since early 2019 from previous fall requiring placement in skilled nursing facility recently admitted with left lower lobe pneumonia with associated shortness of breath productive cough and fever.  On admission chest x-ray there is a area of consolidation in the left lower lung.  He was treated with antibiotics but developed a progressive left pleural effusion which is now quite extensive involving the entire left pleural space.  2 attempts at thoracentesis, the last being with ultrasound guidance, were unsuccessful at draining the effusion.  2 to 3 ounces of fluid removed yesterday show multiple white blood cells, cloudy appearance, elevated LDH and negative Gram stain.  Thoracic surgical evaluation for drainage of loculated effusion was requested.  Patient is currently on room air and has intermittent shortness of breath and pleuritic pain on the left side.  Patient has history of CLL, baseline white count greater than 30,000 Patient has a remote history of smoking. He has been treated as an outpatient by Dr. Melvyn Novas for pulmonary problems related to GERD.  He has diabetes. No prior history of thoracic trauma or pneumothorax. History of atrial fibrillation and pacemaker, on chronic Coumadin which has been held for the thoracenteses procedures  Current Activity/ Functional Status: Patient unable to walk except with a rolling walker but  currently was residing at home with his wife prior to admission   Zubrod Score: At the time of surgery this patient's most appropriate activity status/level should be described as: _0     0    Normal activity, no symptoms _1     1    Restricted in physical strenuous activity but ambulatory, able to do out light work _2     2    Ambulatory and capable of self care, unable to do work activities, up and about                 more than 50%  Of the time                            _3     3    Only limited self care, in bed greater than 50% of waking hours _4     4    Completely disabled, no self care, confined to bed or chair _5     5    Moribund  Past Medical History:  Diagnosis Date  . Asthma 1950's   history of  . Atrial fibrillation ([place])   . Bilateral carpal tunnel syndrome   . Bilateral lower extremity edema   . Bladder tumor   . Chronic systolic heart failure (HCC)    Echo 1/19: Mild LVH, EF 45-50, inf HK, MAC, severe LAE, severe RAE // Echo 7/15: Mild LVH, mod focal basal sept hypertrophy, EF 55-60, AV peak and mean 16/9, trivial MR, mod  LAE, PASP 38  . CLL (chronic lymphocytic leukemia) [facility]) oncologist-  dr Ilene Qua--   dx [phone] ;  Lymphocytosis, CLL - per lov note 05-11-2017 currently under active survillance,  CT 04-17-2014 show very small lymphadenopathy, no indication for treatment  . Coronary artery disease    cardiologist-  dr Cathie Olden--  08-18-2017 Intermittant risk nuclear study w/ large area of inferior infartion with no evidence ishcemia   . 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 Diabetes mellitus type 2, noninsulin dependent ([place])   . Elevated PSA    since prostatectomy but now resolved  . Hematuria 04/2017  . History of ear infection    Right  . History of MI (myocardial infarction)    per myoview nuclear study 08-18-2017 , unknown when  . History of shingles 08/2017   L ear and scalp, possible  . Hyperlipidemia   . Hypertension   . Ischemic cardiomyopathy 09/01/2017   Presumed +CAD  with Nuclear stress test 08/18/17 - Inferior scar, no ischemia, intermediate risk // med management unless +angina or worse dyspnea  . OA (osteoarthritis)   . Pacemaker 02/08/2014   followed by dr g. taylor--  single chamber Biotronik due to SSS  . Permanent atrial fibrillation ([place])   . Pneumonia 2019   Left lung  . Prostate cancer [facility]) urologist-  dr Diona Fanti   dx 2004--  Gleason 8, PSA 10.45--  11-28-2002  s/p  radical prostatectomy;  recurrent w/ increasing PSA, started ADT treatment  . RBBB (right bundle branch block)   . Sick sinus syndrome ([place])    a-Flutter with episodes of bradycardia; S/P Biotronik (serial number [id number]) 02-08-2014  . Urinary incontinence   . Wears hearing aid in right ear    receiver and transmitter    Past Surgical History:  Procedure Laterality Date  . APPENDECTOMY    . BACK SURGERY     disk  . CARDIAC CATHETERIZATION  09-03-1999  dr Cathie Olden   abnormal cardiolite study:  minor luminal irregularities but no critial coronary artery stenosis  . CARDIOVASCULAR STRESS TEST  08-18-2017  dr Cathie Olden   Intermediate risk nuclear study w/ large area inferior infarction, no evidence of ishcemia (consistant w/ prior MI)/  study not gated due to frequent PVCs  . CARPAL TUNNEL RELEASE Right 2000  . CARPAL TUNNEL RELEASE Left 11/19/2009  . CATARACT EXTRACTION Right 07/2015  . CATARACT EXTRACTION Left 09/2015  . CYSTOSCOPY N/A 11/04/2017   Procedure: CYSTOSCOPY AND CAUTERIZATION OF BLADDER;  Surgeon: Franchot Gallo, MD;  [facility];  Service: Urology;  Laterality: N/A;  . INSERTION PENILE PROSTHESIS  02-22-2004    dr Mattie Marlin  [facility]  . IR THORACENTESIS ASP PLEURAL SPACE W/IMG GUIDE  11/26/2017  . KNEE ARTHROSCOPY Left 07/2010  . PERMANENT PACEMAKER INSERTION N/A 02/08/2014   Procedure: PERMANENT PACEMAKER INSERTION;  Surgeon: Evans Lance, MD;  Location: [facility] CATH LAB;  Service: Cardiovascular;  Laterality: N/A;  . RADICAL RETROPUBIC  PROSTATECTOMY W/ BILATERAL PELVIC LYMPH NODE DISSECTION  11-28-2002   dr Mattie Marlin  [facility]  . TONSILLECTOMY    . TOTAL HIP ARTHROPLASTY Left 05/12/2016   Procedure: LEFT TOTAL HIP ARTHROPLASTY ANTERIOR APPROACH;  Surgeon: Paralee Cancel, MD;  Location: [facility];  Service: Orthopedics;  Laterality: Left;  . TOTAL HIP ARTHROPLASTY Right 07-15-2006   dr Alvan Dame  [facility]  . TRANSTHORACIC ECHOCARDIOGRAM  07/20/2017   mild LVH, ef 45-50%, hypokinesis of the basal-midinferior myocardium, due to AFib unable to evaluate diastolic function/  severe LAE and RAE/  trivial PR and TR    Social History   Tobacco Use  Smoking Status Former Smoker  . Years: 25.00  . Types: Pipe, Cigars  . Last attempt to quit: 11/25/1975  . Years since quitting: 42.0  Smokeless Tobacco Never Used    Social History   Substance and Sexual Activity  Alcohol Use Yes  . Alcohol/week: 0.0 oz   Comment: occasional     Allergies  Allergen Reactions  . Altace [Ramipril] Other (See Comments)    "throat felt like had a knot in it"  . Codeine Nausea And Vomiting    Nausea and vomiting   . Simvastatin Other (See Comments)    Leg aches    Current Facility-Administered Medications  Medication Dose Route Frequency Provider Last Rate Last Dose  . atorvastatin (LIPITOR) tablet 20 mg  20 mg Oral q1800 Jani Gravel, MD   20 mg at 11/26/17 1646  . carvedilol (COREG) tablet 3.125 mg  3.125 mg Oral BID Jani Gravel, MD   3.125 mg at 11/27/17 0827  . cefTRIAXone (ROCEPHIN) 2 g in sodium chloride 0.9 % 100 mL IVPB  2 g Intravenous Q24H Georgette Shell, MD 200 mL/hr at 11/27/17 1342 2 g at 11/27/17 1342  . chlorpheniramine-HYDROcodone (TUSSIONEX) 10-8 MG/5ML suspension 5 mL  5 mL Oral Q12H PRN Minor, Grace Bushy, NP   5 mL at 11/26/17 2240  . fenofibrate tablet 54 mg  54 mg Oral Daily Jani Gravel, MD   54 mg at 11/27/17 5462  . furosemide (LASIX) tablet 20 mg  20 mg Oral Daily Derrill Kay A, MD   20 mg at 11/27/17 0827  . guaiFENesin (MUCINEX)  12 hr tablet 600 mg  600 mg Oral BID Georgette Shell, MD   600 mg at 11/27/17 0827  . HYDROcodone-acetaminophen (NORCO/VICODIN) 5-325 MG per tablet 1-2 tablet  1-2 tablet Oral Q6H PRN Vertis Kelch, NP   2 tablet at 11/22/17 2117  . insulin aspart (novoLOG) injection 0-5 Units  0-5 Units Subcutaneous QHS Jani Gravel, MD   2 Units at 11/26/17 2240  . insulin aspart (novoLOG) injection 0-9 Units  0-9 Units Subcutaneous TID WC Jani Gravel, MD   5 Units at 11/27/17 1339  . insulin aspart (novoLOG) injection 4 Units  4 Units Subcutaneous TID WC Georgette Shell, MD   4 Units at 11/27/17 1340  . insulin glargine (LANTUS) injection 22 Units  22 Units Subcutaneous QHS Phillips Grout, MD   22 Units at 11/26/17 2240  . ipratropium-albuterol (DUONEB) 0.5-2.5 (3) MG/3ML nebulizer solution 3 mL  3 mL Nebulization Q4H PRN Bodenheimer, Charles A, NP   3 mL at 11/21/17 1331  . ipratropium-albuterol (DUONEB) 0.5-2.5 (3) MG/3ML nebulizer solution 3 mL  3 mL Nebulization BID Phillips Grout, MD   3 mL at 11/27/17 0722  . MEDLINE mouth rinse  15 mL Mouth Rinse BID Jani Gravel, MD   15 mL at 11/27/17 0829  . methylPREDNISolone sodium succinate (SOLU-MEDROL) 40 mg/mL injection 40 mg  40 mg Intravenous Daily Ollis, Brandi L, NP   40 mg at 11/27/17 0827  . [place]   Oral PRN Georgette Shell, MD      . Warfarin - Pharmacist Dosing Inpatient   Does not apply q1800 Ivin Poot, MD        Medications Prior to Admission  Medication Sig Dispense Refill Last Dose  . atorvastatin (LIPITOR) 20 MG tablet TAKE 1  TABLET BY MOUTH DAILY AT 6PM 90 tablet 0 11/19/2017 at Unknown time  . carvedilol (COREG) 3.125 MG tablet Take 1 tablet (3.125 mg total) by mouth 2 (two) times daily. 180 tablet 3 11/20/2017 at Unknown time  . fenofibrate micronized (LOFIBRA) 200 MG capsule TAKE 1 CAPSULE BY MOUTH DAILY BEFORE BREAKFAST 90 capsule 3 11/20/2017 at Unknown time  . furosemide (LASIX) 20 MG tablet Take 1  tablet (20 mg total) by mouth daily as needed. 30 tablet 6  at Unknown  . glipiZIDE (GLUCOTROL XL) 2.5 MG 24 hr tablet Take 2.5 mg by mouth as needed (FOR DIABETES BS >140).    11/20/2017 at Unknown time  . JANUVIA 100 MG tablet Take 100 mg by mouth at bedtime.   2 11/19/2017 at Unknown time  . metFORMIN (GLUCOPHAGE) 1000 MG tablet Take 1,000 mg by mouth 2 (two) times daily with a meal.     11/20/2017 at Unknown time  . multivitamin (THERAGRAN) per tablet Take 1 tablet by mouth daily. Will stop prior to procedure   11/20/2017 at Unknown time  . vitamin B-12 (CYANOCOBALAMIN) 1000 MCG tablet Take 1,000 mcg by mouth daily. Will stop prior to procedure   11/20/2017 at Unknown time  . warfarin (COUMADIN) 5 MG tablet TAKE AS DIRECTED BY COUMADIN CLINIC (Patient taking differently: Take 5-7.5 mg by mouth See admin instructions. Take 7.27m on Saturdays and 545mon all other days or TAKE AS DIRECTED BY COUMADIN CLINIC) 90 tablet 1 11/19/2017 at 2200    Family History  Problem Relation Age of Onset  . Emphysema Mother   . Allergic rhinitis Mother   . Heart attack Father   . Allergic rhinitis Brother   . Pulmonary fibrosis Brother      Review of Systems:   ROS History of prostate cancer Echocardiogram early 2019 shows EF 45% without aortic stenosis    Cardiac Review of Systems: Y or  [    ]= no  Chest Pain [yBlue.Reese  ]  Resting SOB [   ] Exertional SOB  [yBlue.Reese]  Orthopnea [ y ]   Pedal Edema [   ]    Palpitations [  ] Syncope  [  ]   Presyncope [   ]  General Review of Systems: [Y] = yes [  ]=no Constitional: recent weight change [  ]; anorexia [yBlue.Reese]; fatigue [  ]; nausea [  ]; night sweats [  ]; fever [  ]; or chills [  ]                                                               Dental: Last Dentist visit: 1 year  Eye : blurred vision [  ]; diplopia [   ]; vision changes [  ];  Amaurosis fugax[  ]; Resp: cough [  ];  wheezing[  ];  hemoptysis[  ]; shortness of breath[y  ]; paroxysmal nocturnal dyspnea[  ];  dyspnea on exertion[ y ]; or orthopnea[  ];  GI:  gallstones[  ], vomiting[  ];  dysphagia[  ]; melena[  ];  hematochezia [  ]; heartburn[  ];   Hx of  Colonoscopy[  ]; GU: kidney stones [  ]; hematuria[  ];  dysuria [  ];  nocturia[  ];  history of     obstruction [  ]; urinary frequency [  ]             Skin: rash, swelling[  ];, hair loss[  ];  peripheral edema[  ];  or itching[  ]; Musculosketetal: myalgias[  ];  joint swelling[  ];  joint erythema[  ];  joint pain[  ];  back pain[  ];  Heme/Lymph: bruising[  ];  bleeding[  ];  anemia[  ];  Neuro: TIA[  ];  headaches[  ];  stroke[  ];  vertigo[  ];  seizures[  ];   paresthesias[  ];  difficulty walking[  ];  Psych:depression[  ]; anxiety[  ];  Endocrine: diabetes[  ];  thyroid dysfunction[  ];            {    Physical Exam: BP 115/71 (BP Location: Right Arm)   Pulse 76   Temp 97.8 F (36.6 C) (Oral)   Resp (!) 34   Ht _0  (1.778 m)   Wt 138 lb 12.8 oz (63 kg)   SpO2 90%   BMI 19.92 kg/m        Exam    General- alert and comfortable.  Ecchymoses over the right supraorbital ridge and temple area from a previous fall.    Neck- no JVD, no cervical adenopathy palpable, no carotid bruit   Lungs- clear breath sounds on right, image breath sounds of left hemithorax   Cor-irregular heart rate of atrial fibrillation rhythm, no murmur , gallop   Abdomen- soft, non-tender   Extremities - warm, non-tender, minimal edema   Neuro- oriented, appropriate, no focal weakness    Diagnostic Studies & Laboratory data:     Recent Radiology Findings:   Dg Chest 1 View  Result Date: 11/26/2017 CLINICAL DATA:  Status post thoracentesis. EXAM: CHEST  1 VIEW COMPARISON:  Yesterday. FINDINGS: Significant decrease in amount of left pleural fluid following thoracentesis. No pneumothorax. Stable enlarged cardiac silhouette. Dense airspace opacity at the left lung base, increased. Clear right lung with an overlying skin fold. Bilateral shoulder  degenerative changes. IMPRESSION: 1. No pneumothorax following left thoracentesis. 2. Dense left lower lobe atelectasis or pneumonia, significantly increased. 3. Stable cardiomegaly. Electronically Signed   By: Claudie Revering M.D.   On: 11/26/2017 13:06   Dg Chest Port 1 View  Result Date: 11/27/2017 CLINICAL DATA:  Pneumonia EXAM: PORTABLE CHEST 1 VIEW COMPARISON:  November 26, 2017 FINDINGS: There is layering pleural effusion on the left. There is consolidation in the left base. There is mild right base atelectasis. Right lung otherwise is clear. Heart is mildly enlarged with pulmonary vascularity normal. Pacemaker lead attached to right ventricle. No pneumothorax. No adenopathy. No bone lesions. IMPRESSION: Layering pleural effusion on the left with persistent consolidation left base. Mild right base atelectasis. Stable cardiac prominence. Electronically Signed   By: Lowella Grip III M.D.   On: 11/27/2017 09:27   Dg Chest Port 1 View  Result Date: 11/25/2017 CLINICAL DATA:  84 year old male with shortness of breath and pleural effusion. EXAM: PORTABLE CHEST 1 VIEW COMPARISON:  11/23/2017 CT, chest radiograph and prior studies FINDINGS: Cardiomediastinal silhouette is unchanged. A LEFT pacemaker again noted. LEFT pleural effusion and LEFT LOWER lung consolidation/atelectasis again noted. The RIGHT lung is clear. No pneumothorax or acute bony abnormality. IMPRESSION: Little significant change in appearance of the chest with continued LEFT pleural effusion and LEFT LOWER lung  atelectasis/consolidation. Electronically Signed   By: Margarette Canada M.D.   On: 11/25/2017 16:53   Ir Thoracentesis Asp Pleural Space W/img Guide  Result Date: 11/26/2017 INDICATION: Patient with left pleural effusion. Request is made for diagnostic and therapeutic thoracentesis. EXAM: ULTRASOUND GUIDED DIAGNOSTIC AND THERAPEUTIC LEFT THORACENTESIS MEDICATIONS: 10 mL 2% lidocaine COMPLICATIONS: None immediate. PROCEDURE: An ultrasound  guided thoracentesis was thoroughly discussed with the patient and questions answered. The benefits, risks, alternatives and complications were also discussed. The patient understands and wishes to proceed with the procedure. Written consent was obtained. Ultrasound was performed to localize and mark an adequate pocket of fluid in the left chest. The area was then prepped and draped in the normal sterile fashion. 2% lidocaine was used for local anesthesia. Under ultrasound guidance a Safe-T-Centesis catheter was introduced. Thoracentesis was performed. The catheter was removed and a dressing applied. FINDINGS: A total of approximately 70 mL of cloudy yellow fluid was removed. Samples were sent to the laboratory as requested by the clinical team. IMPRESSION: Successful ultrasound guided diagnostic and therapeutic left thoracentesis yielding 70 mL of pleural fluid. Read by: Brynda Greathouse PA-C Electronically Signed   By: Markus Daft M.D.   On: 11/26/2017 14:15     I have independently reviewed the above radiologic studies and discussed with the patient   Recent Lab Findings: Lab Results  Component Value Date   WBC 37.7 (H) 11/27/2017   HGB 10.2 (L) 11/27/2017   HCT 30.2 (L) 11/27/2017   PLT 631 (H) 11/27/2017   GLUCOSE 180 (H) 11/27/2017   CHOL 145 05/28/2015   TRIG 107 05/28/2015   HDL 23 (L) 05/28/2015   LDLDIRECT 133.8 07/13/2012   LDLCALC 101 05/28/2015   ALT 21 11/21/2017   AST 24 11/21/2017   NA 137 11/27/2017   K 4.4 11/27/2017   CL 96 (L) 11/27/2017   CREATININE 0.95 11/27/2017   BUN 31 (H) 11/27/2017   CO2 31 11/27/2017   TSH 0.436 07/22/2017   INR 1.44 11/27/2017   HGBA1C 6.0 (H) 05/05/2016      Assessment / Plan:   84 year old gentleman with limited functional status, recent falls and CLL admitted with left lower lobe pneumonia.  Despite aggressive therapy patient has developed a large loculated pleural effusion-empyema.  2 attempts with thoracentesis have been unable to  drain this fluid which appears infected by cytology  Surgical drainage with a limited VATS is the patient's only option for long-term resolution of this process and to prevent slow progressive decline and demise.  Patient and wife understand that this is not minor surgery and will require a significant period of recovery and rehabilitation.  They understand that he could develop serious complications in the ICU including delirium, aspiration, progressive pneumonia, cardiac complications, infectious complications and death.  They understand that his DNR status will be revised for the procedure and for the postoperative recovery.  We will hold his Coumadin and schedule surgery for mid day on June 10 at [facility].     I  spent 30 minutes counseling the patient face to face.   _0 @ 11/27/2017 2:35 PM

## 2017-11-27 NOTE — Progress Notes (Signed)
[facility] War[facility]in Indication: atrial fibrillation  Allergies  Allergen Reactions  . Altace [Ramipril] Other (See Comments)    "throat felt like had a knot in it"  . Codeine Nausea And Vomiting    Nausea and vomiting   . Simvastatin Other (See Comments)    Leg aches    Patient Measurements: Height: 5\' 10"  (177.8 cm) Weight: 138 lb 12.8 oz (63 kg) IBW/kg (Calculated) : 73  Vital Signs: Temp: 97.9 F (36.6 C) (06/08 0737) Temp Source: Oral (06/08 0737) BP: 109/71 (06/08 0737) Pulse Rate: 72 (06/08 0737)  Labs: Recent Labs    11/25/17 0532 11/26/17 0403 11/27/17 0240  HGB 9.4* 9.8* 10.2*  HCT 27.4* 28.9* 30.2*  PLT 507* 550* 631*  LABPROT 23.1* 17.4* 17.4*  INR 2.07 1.44 1.44  CREATININE 0.97 0.91 0.95    Estimated Creatinine Clearance: 52.5 mL/min (by C-G formula based on SCr of 0.95 mg/dL).  Assessment: 84 y.o. male admitted on 11/20/2017 with pneumonia. On warfarin PTA for AFib- home dose is 5mg  daily except 7.5mg  on Saturdays, admit INR 2.01. Warfarin was reversed with Vit K 5 mg on 6/5 for thoracentesis - done 6/6 and 6/7. Per CCM note, okay to resume warfarin post-thoracentesis on 6/7.   INR today is SUBtherapeutic at 1.44. Hgb is 10.2, plt 631. No s/sx of bleeding. On home fenofibrate dose and concurrent steroid therapy, which can increase warfarin sensitivity.    Goal of Therapy:  INR 2-3 Monitor platelets by anticoagulation protocol: Yes   Plan: 1. Warfarin 7.5 mg x 1  2. Will continue to monitor for any signs/symptoms of bleeding and will follow up with PT/INR in the a.m.   Thank you for allowing pharmacy to be a part of this patient's care.  Doylene Canard, PharmD Clinical Pharmacist  Pager: [phone] Phone: [phone] 11/27/2017 11:42 AM

## 2017-11-27 NOTE — Progress Notes (Addendum)
SATURATION QUALIFICATIONS: (This note is used to comply with regulatory documentation for home oxygen)  Patient Saturations on Room Air at Rest = 95%  Patient Saturations on Room Air while Ambulating = 94%  Patient Saturations on 0 Liters of oxygen while Ambulating = 93%  Please briefly explain why patient needs home oxygen:  Patient ambulated 500 feet with walker on room air. No complaints of SOB. Saturations remained above 93%.   5320: Patient ambulated an additional 500 feet without c/o SOB. RA sat 94%

## 2017-11-28 DIAGNOSIS — A419 Sepsis, unspecified organism: Secondary | ICD-10-CM

## 2017-11-28 LAB — GLUCOSE, CAPILLARY
GLUCOSE-CAPILLARY: 359 mg/dL — AB (ref 65–99)
GLUCOSE-CAPILLARY: 419 mg/dL — AB (ref 65–99)
Glucose-Capillary: 249 mg/dL — ABNORMAL HIGH (ref 65–99)
Glucose-Capillary: 164 mg/dL — ABNORMAL HIGH (ref 65–99)
Glucose-Capillary: 146 mg/dL — ABNORMAL HIGH (ref 65–99)

## 2017-11-28 LAB — COMPREHENSIVE METABOLIC PANEL
ALT: 20 U/L (ref 17–63)
AST: 22 U/L (ref 15–41)
Albumin: 2.5 g/dL — ABNORMAL LOW (ref 3.5–5.0)
Alkaline Phosphatase: 77 U/L (ref 38–126)
Anion gap: 10 (ref 5–15)
BUN: 30 mg/dL — ABNORMAL HIGH (ref 6–20)
CO2: 28 mmol/L (ref 22–32)
Calcium: 9.1 mg/dL (ref 8.9–10.3)
Chloride: 97 mmol/L — ABNORMAL LOW (ref 101–111)
Creatinine, Ser: 1.01 mg/dL (ref 0.61–1.24)
GFR calc Af Amer: >60 mL/min (ref >60)
GFR calc non Af Amer: >60 mL/min (ref >60)
Glucose, Bld: 320 mg/dL — ABNORMAL HIGH (ref 65–99)
Potassium: 5.3 mmol/L — ABNORMAL HIGH (ref 3.5–5.1)
Sodium: 135 mmol/L (ref 135–145)
Total Bilirubin: 1 mg/dL (ref 0.3–1.2)
Total Protein: 6.2 g/dL — ABNORMAL LOW (ref 6.5–8.1)

## 2017-11-28 LAB — CBC WITH DIFFERENTIAL/PLATELET
BASOS ABS: 0 10*3/uL (ref 0.0–0.1)
Basophils Relative: 0 %
EOS ABS: 0 10*3/uL (ref 0.0–0.7)
Eosinophils Relative: 0 %
HCT: 31.1 % — ABNORMAL LOW (ref 39.0–52.0)
Hemoglobin: 10.3 g/dL — ABNORMAL LOW (ref 13.0–17.0)
LYMPHS ABS: 31.8 10*3/uL — AB (ref 0.7–4.0)
Lymphocytes Relative: 67 %
MCH: 32.1 pg (ref 26.0–34.0)
MCHC: 33.1 g/dL (ref 30.0–36.0)
MCV: 96.9 fL (ref 78.0–100.0)
Monocytes Absolute: 1.9 10*3/uL — ABNORMAL HIGH (ref 0.1–1.0)
Monocytes Relative: 4 %
NEUTROS ABS: 13.7 10*3/uL — AB (ref 1.7–7.7)
Neutrophils Relative %: 29 %
Platelets: 671 10*3/uL — ABNORMAL HIGH (ref 150–400)
RBC: 3.21 MIL/uL — ABNORMAL LOW (ref 4.22–5.81)
RDW: 18.7 % — AB (ref 11.5–15.5)
WBC: 47.4 10*3/uL — ABNORMAL HIGH (ref 4.0–10.5)

## 2017-11-28 LAB — BASIC METABOLIC PANEL
Anion gap: 9 (ref 5–15)
BUN: 28 mg/dL — AB (ref 6–20)
CALCIUM: 8.9 mg/dL (ref 8.9–10.3)
CO2: 29 mmol/L (ref 22–32)
CREATININE: 1.01 mg/dL (ref 0.61–1.24)
Chloride: 100 mmol/L — ABNORMAL LOW (ref 101–111)
GFR calc non Af Amer: >60 mL/min (ref >60)
Glucose, Bld: 155 mg/dL — ABNORMAL HIGH (ref 65–99)
Potassium: 4.5 mmol/L (ref 3.5–5.1)
SODIUM: 138 mmol/L (ref 135–145)

## 2017-11-28 LAB — BLOOD GAS, ARTERIAL
Acid-Base Excess: 2.1 mmol/L — ABNORMAL HIGH (ref 0.0–2.0)
Bicarbonate: 25.3 mmol/L (ref 20.0–28.0)
Drawn by: 51185
FIO2: 0.21
O2 Saturation: 88.9 %
Patient temperature: 98.6
pCO2 arterial: 34 mmHg (ref 32.0–48.0)
pH, Arterial: 7.485 — ABNORMAL HIGH (ref 7.350–7.450)
pO2, Arterial: 55.6 mmHg — ABNORMAL LOW (ref 83.0–108.0)

## 2017-11-28 LAB — PREPARE RBC (CROSSMATCH)

## 2017-11-28 LAB — PROTIME-INR
INR: 1.38
PROTHROMBIN TIME: 16.8 s — AB (ref 11.4–15.2)

## 2017-11-28 LAB — ABO/RH: ABO/RH(D): O POS

## 2017-11-28 MED ORDER — INSULIN GLARGINE 100 UNIT/ML ~~LOC~~ SOLN: 25.0000 [IU] | Freq: Every day | SUBCUTANEOUS | Status: DC

## 2017-11-28 MED ORDER — CEFAZOLIN SODIUM-DEXTROSE 2-4 GM/100ML-% IV SOLN
2.0000 g | INTRAVENOUS | Status: AC
  Administered 2017-11-29: 2 g via INTRAVENOUS

## 2017-11-28 MED ORDER — INSULIN ASPART 100 UNIT/ML ~~LOC~~ SOLN
10.0000 [IU] | Freq: Three times a day (TID) | SUBCUTANEOUS | Status: DC
  Administered 2017-11-28: 10 [IU] via SUBCUTANEOUS

## 2017-11-28 MED ORDER — INSULIN ASPART 100 UNIT/ML ~~LOC~~ SOLN
10.0000 [IU] | Freq: Once | SUBCUTANEOUS | Status: AC
  Administered 2017-11-28: 10 [IU] via SUBCUTANEOUS

## 2017-11-28 MED ORDER — INSULIN GLARGINE 100 UNIT/ML ~~LOC~~ SOLN
30.0000 [IU] | Freq: Every day | SUBCUTANEOUS | Status: DC
  Filled 2017-11-28: qty 0.3

## 2017-11-28 MED ORDER — INSULIN ASPART 100 UNIT/ML ~~LOC~~ SOLN
8.0000 [IU] | Freq: Three times a day (TID) | SUBCUTANEOUS | Status: AC
  Administered 2017-11-28: 8 [IU] via SUBCUTANEOUS

## 2017-11-28 MED ORDER — INSULIN ASPART 100 UNIT/ML ~~LOC~~ SOLN: 8.0000 [IU] | Freq: Three times a day (TID) | SUBCUTANEOUS | Status: DC

## 2017-11-28 MED ORDER — INSULIN GLARGINE 100 UNIT/ML ~~LOC~~ SOLN
20.0000 [IU] | Freq: Every day | SUBCUTANEOUS | Status: DC
  Administered 2017-11-28: 20 [IU] via SUBCUTANEOUS
  Filled 2017-11-28 (×2): qty 0.2

## 2017-11-28 MED ORDER — IPRATROPIUM-ALBUTEROL 0.5-2.5 (3) MG/3ML IN SOLN: 3.0000 mL | Freq: Two times a day (BID) | RESPIRATORY_TRACT | Status: DC | PRN

## 2017-11-28 MED ORDER — INSULIN GLARGINE 100 UNIT/ML ~~LOC~~ SOLN: 30.0000 [IU] | Freq: Every day | SUBCUTANEOUS | Status: DC

## 2017-11-28 NOTE — Progress Notes (Signed)
Procedure(s) (LRB): VIDEO ASSISTED THORACOSCOPY (VATS)/EMPYEMA (Left) Subjective: Patient with some shortness of breath but afebrile with room air saturation above 90%  Plan drainage of left parapneumonic effusion-empyema with VATS and OR tomorrow  Procedure indications benefits alternatives and risks have been discussed with patient and family.  Continue IV Rocephin until surgery Holding Coumadin [chronic A. Fib] with heparin bridge to surgery.  Stop heparin tomorrow morning.  Objective: Vital signs in last 24 hours: Temp:  [97.6 F (36.4 C)-98.8 F (37.1 C)] 97.8 F (36.6 C) (06/09 0746) Pulse Rate:  [60-74] 67 (06/09 1025) Cardiac Rhythm: Ventricular paced;Bundle branch block (06/09 1015) Resp:  [16-20] 18 (06/09 0803) BP: (97-112)/(54-64) 108/55 (06/09 1025) SpO2:  [89 %-96 %] 94 % (06/09 1025)  Hemodynamic parameters for last 24 hours:    Intake/Output from previous day: 06/08 0701 - 06/09 0700 In: 700 [P.O.:700] Out: 2850 [Urine:2850] Intake/Output this shift: Total I/O In: 360 [P.O.:360] Out: -   Alert responsive, breathing comfortably Elderly somewhat fragile Diminished breath sounds at left base  Lab Results: Recent Labs    11/27/17 0240 11/28/17 0257  WBC 37.7* 47.4*  HGB 10.2* 10.3*  HCT 30.2* 31.1*  PLT 631* 671*   BMET:  Recent Labs    11/28/17 0257 11/28/17 1301  NA 138 135  K 4.5 5.3*  CL 100* 97*  CO2 29 28  GLUCOSE 155* 320*  BUN 28* 30*  CREATININE 1.01 1.01  CALCIUM 8.9 9.1    PT/INR:  Recent Labs    11/28/17 0257  LABPROT 16.8*  INR 1.38   ABG    Component Value Date/Time   PHART 7.485 (H) 11/28/2017 1345   HCO3 25.3 11/28/2017 1345   TCO2 26 11/04/2017 1033   O2SAT 88.9 11/28/2017 1345   CBG (last 3)  Recent Labs    11/28/17 0746 11/28/17 1009 11/28/17 1357  GLUCAP 249* 359* 419*    Assessment/Plan: S/P Procedure(s) (LRB): VIDEO ASSISTED THORACOSCOPY (VATS)/EMPYEMA (Left) Left VATS tomorrow   LOS: 8  days    Tharon Aquas Trigt III 11/28/2017

## 2017-11-28 NOTE — Progress Notes (Signed)
[place] for Warfarin Indication: atrial fibrillation  Allergies  Allergen Reactions  . Altace [Ramipril] Other (See Comments)    "throat felt like had a knot in it"  . Codeine Nausea And Vomiting    Nausea and vomiting   . Simvastatin Other (See Comments)    Leg aches    Patient Measurements: Height: 5\' 10"  (177.8 cm) Weight: 138 lb 12.8 oz (63 kg) IBW/kg (Calculated) : 73  Vital Signs: Temp: 97.8 F (36.6 C) (06/09 0746) Temp Source: Oral (06/09 0746) BP: 108/55 (06/09 1025) Pulse Rate: 67 (06/09 1025)  Labs: Recent Labs    11/26/17 0403 11/27/17 0240 11/28/17 0257  HGB 9.8* 10.2* 10.3*  HCT 28.9* 30.2* 31.1*  PLT 550* 631* 671*  LABPROT 17.4* 17.4* 16.8*  INR 1.44 1.44 1.38  CREATININE 0.91 0.95 1.01    Estimated Creatinine Clearance: 49.4 mL/min (by C-G formula based on SCr of 1.01 mg/dL).  Assessment: 84 y.o. male admitted on 11/20/2017 with pneumonia. On warfarin PTA for AFib- home dose is 5mg  daily except 7.5mg  on Saturdays, admit INR 2.01. Warfarin was reversed with Vit K 5 mg on 6/5 for thoracentesis - done 6/6 and 6/7. Per CCM note, okay to resume warfarin post-thoracentesis on 6/7.   INR today is SUBtherapeutic at 1.38- warfarin dose stopped last night due to possible VATs planned for 6/10. CBC stable. No s/sx of bleeding. On home fenofibrate dose and concurrent steroid therapy, which can increase warfarin sensitivity.    Goal of Therapy:  INR 2-3 Monitor platelets by anticoagulation protocol: Yes   Plan: 1. Hold warfarin for possible VATs on 6/10 2. Will continue to monitor for any signs/symptoms of bleeding and will follow up with PT/INR in the a.m.   Thank you for allowing pharmacy to be a part of this patient's care.  Doylene Canard, PharmD Clinical Pharmacist  Pager: [phone] Phone: [phone] 11/28/2017 11:01 AM

## 2017-11-28 NOTE — Plan of Care (Signed)
Pt on room air today with no respiratory distress.  Ambulated hallway with good tolerance.  Appetite good.  Verbalizes understanding of upcoming procedure and disease process.  Continues to refuse thickened liquids.  Reviewed importance for aspiration prevention.

## 2017-11-28 NOTE — Progress Notes (Signed)
Patient ID: Oscar Baker, male   DOB: [birth year], 84 y.o.   MRN: [id number]  Patient ID: Oscar Baker, male   DOB: 03-Mar-1933, 84 y.o.   MRN: [id number] Patient ID: Oscar Baker, male   DOB: 15-Dec-1932, 84 y.o.   MRN: [id number]  PROGRESS NOTE    Oscar Baker  MWU:[id number] DOB: 1933-03-16 DOA: 11/20/2017 PCP: [name], Oscar Baker     Brief Narrative:   84 year-old male with Baker history of CLL, prostate cancer, sick sinus syndrome, pacemaker, paroxysmal atrial fibrillation, CHF with ejection fraction 45% admitted with fever increasing shortness of breath cough with yellow to green phlegm for 1 week prior to admission. He was found to have left lower lobe pneumonia with associated effusion and is admitted for the treatment of the same.  Thoracentesis was attempted on 11/25/2017 by pulmonology service but had to be aborted due to coughing.  IR did thoracentesis on 11/26/2017 with only 70 cc fluid obtained.  CT surgery consulted on 11/27/2017 recommending VATS on Monday morning.    Assessment & Plan:   Principal Problem:   HCAP (healthcare-associated pneumonia) Active Problems:   HTN (hypertension)   Hyperlipidemia   Diabetes mellitus type 2, noninsulin dependent (HCC)   LPRD (laryngopharyngeal reflux disease)   Other allergic rhinitis   Anemia   Chronic cough   CLL (chronic lymphocytic leukemia) (HCC)   Pleural effusion   Pneumonia of left lower lobe due to infectious organism [facility])   Mediastinal lymphadenopathy   Hypoxemia   Sepsis (HCC)  1]HCAP/H influenza bacteremia with complicated loculated pleural effusion on the left-patient improving clinically every day.  Currently on ceftriaxone 2 g IV every 24 hours for H influenza bacteremia. Blood culture came back positive for H. influenzae.  Repeat blood cultures negative thus far except for likely contaminant. leukocytosis secondary to CLL.   Steroids  per pulmonary service.   pleural fluid cultures both from tap from IR and pulmonary are  negative thus far.  Discussed case with pulmonary team 11/27/2017 who are recommending CT surgery evaluation for loculated pleural effusion.   plan now is for VATS surgery in the morning.  2]paroxysmal atrial fibrillation with CHF continue carvedilol and Coumadin was on hold for tap.   Resumed on 11/26/2017. Increase the dose of Lasix to 40 mg IV twice daily for 3 days and then placed back on home dose of 20 mg orally daily.  Chest x-ray with persistent pleural effusion despite 2 failed attempts at thoracentesis with minimal fluid drained.  Echocardiogram with ejection fraction 45 to 50% January 2019.  Coumadin on hold again since 11/27/2017 for surgery in the morning.  3]history of hypertension continue carvedilol and Lasix.  4]type 2 diabetes  blood sugar has been elevated.  Increasing sliding scale needs and Lantus 20 to 22 units.  On steroids.  Control is improving.  5]hyponatremia- resolved  6) high aspiration risk-patient is not happy about thickening his liquids.  Speech therapy and dietitian consulted.  7.)  Sepsis on admission from pneumonia is resolved.  8.)  Acute hypoxic respiratory failure secondary to the above issues of also resolved.  Patient's been weaned down to room air with normal O2 sats.  9.)  CLL-persistent leukocytosis secondary to CLL    DVT prophylaxis: SCDs and Coumadin on hold for now code Status: DNR which will be rescinded during operative. family Communication: Wife  disposition Plan: Pending surgical outcome  Consultants:   Pulmonology  CT surgery  Procedures: Thoracentesis  VATS surgery planned  for 11/29/2017   Antimicrobials:   Rocephin 2 g IV every 24 hours   Subjective: Patient has been updated by Dr. Darcey Nora about the need for VATS surgery.  Him and his wife have agreed to proceed with this plan.  I had another long discussion with the patient about his aspiration risk.  He is not very happy about his dysphagia diet are about thickening  his liquids.  I highly suspect he will be noncompliant with this.  Continue education.  Objective: Vitals:   11/28/17 0300 11/28/17 0746 11/28/17 0803 11/28/17 1025  BP: 110/64 (!) 109/59  (!) 108/55  Pulse: 72 74 71 67  Resp: 16 18 18    Temp: 97.6 F (36.4 C) 97.8 F (36.6 C)    TempSrc: Oral Oral    SpO2: 96% (!) 89% 91% 94%  Weight:      Height:        Intake/Output Summary (Last 24 hours) at 11/28/2017 1105 Last data filed at 11/28/2017 0917 Gross per 24 hour  Intake 600 ml  Output 2050 ml  Net -1450 ml   Filed Weights   11/20/17 2100 11/21/17 0537  Weight: 97.1 kg (214 lb 1.1 oz) 63 kg (138 lb 12.8 oz)    Examination:  General exam: Appears calm and comfortable  Respiratory system: Clear to auscultation. Respiratory effort normal.   much improved breath sounds on the left but still decreased. Cardiovascular system: S1 & S2 heard, RRR. No JVD, murmurs, rubs, gallops or clicks. No pedal edema. Gastrointestinal system: Abdomen is nondistended, soft and nontender. No organomegaly or masses felt. Normal bowel sounds heard. Central nervous system: Alert and oriented. No focal neurological deficits. Extremities: Symmetric 5 x 5 power. Skin: No rashes, lesions or ulcers Psychiatry: Judgement and insight appear normal. Mood & affect appropriate.     Data Reviewed: I have personally reviewed following labs and imaging studies  CBC: Recent Labs  Lab 11/24/17 0306 11/25/17 0532 11/26/17 0403 11/27/17 0240 11/28/17 0257  WBC 46.7* 43.6* 37.3* 37.7* 47.4*  NEUTROABS  --  19.6* 13.4* 9.8* 13.7*  HGB 9.9* 9.4* 9.8* 10.2* 10.3*  HCT 29.5* 27.4* 28.9* 30.2* 31.1*  MCV 96.1 94.8 96.0 95.9 96.9  PLT 463* 507* 550* 631* 937*   Basic Metabolic Panel: Recent Labs  Lab 11/22/17 0241  11/24/17 0306 11/24/17 0930 11/25/17 0532 11/26/17 0403 11/27/17 0240 11/28/17 0257  NA 130*   < > 132*  --  135 136 137 138  K 4.4   < > 4.8  --  4.6 4.4 4.4 4.5  CL 99*   < > 96*  --   97* 100* 96* 100*  CO2 22   < > 28  --  29 29 31 29   GLUCOSE 190*   < > 350* 508* 297* 238* 180* 155*  BUN 17   < > 22*  --  31* 32* 31* 28*  CREATININE 0.82   < > 0.96  --  0.97 0.91 0.95 1.01  CALCIUM 8.2*   < > 8.6*  --  8.7* 8.7* 8.9 8.9  MG 1.9  --   --   --   --   --   --   --    < > = values in this interval not displayed.   GFR: Estimated Creatinine Clearance: 49.4 mL/min (by C-G formula based on SCr of 1.01 mg/dL). Liver Function Tests: Recent Labs  Lab 11/25/17 1922  PROT 5.9*   No results for input(s): LIPASE, AMYLASE in  the last 168 hours. No results for input(s): AMMONIA in the last 168 hours. Coagulation Profile: Recent Labs  Lab 11/24/17 0306 11/25/17 0532 11/26/17 0403 11/27/17 0240 11/28/17 0257  INR 3.66 2.07 1.44 1.44 1.38   Cardiac Enzymes: No results for input(s): CKTOTAL, CKMB, CKMBINDEX, TROPONINI in the last 168 hours. BNP (last 3 results) No results for input(s): PROBNP in the last 8760 hours. HbA1C: No results for input(s): HGBA1C in the last 72 hours. CBG: Recent Labs  Lab 11/27/17 0735 11/27/17 1145 11/27/17 1705 11/28/17 0746 11/28/17 1009  GLUCAP 178* 294* 325* 249* 359*   Lipid Profile: No results for input(s): CHOL, HDL, LDLCALC, TRIG, CHOLHDL, LDLDIRECT in the last 72 hours. Thyroid Function Tests: No results for input(s): TSH, T4TOTAL, FREET4, T3FREE, THYROIDAB in the last 72 hours. Anemia Panel: No results for input(s): VITAMINB12, FOLATE, FERRITIN, TIBC, IRON, RETICCTPCT in the last 72 hours. Sepsis Labs: No results for input(s): PROCALCITON, LATICACIDVEN in the last 168 hours.  Recent Results (from the past 240 hour(s))  Blood culture (routine x 2)     Status: Abnormal   Collection Time: 11/20/17  4:30 PM  Result Value Ref Range Status   Specimen Description BLOOD RIGHT ANTECUBITAL  Final   Special Requests   Final    BOTTLES DRAWN AEROBIC AND ANAEROBIC Blood Culture adequate volume   Culture  Setup Time   Final     GRAM NEGATIVE RODS IN BOTH AEROBIC AND ANAEROBIC BOTTLES CRITICAL RESULT CALLED TO, READ BACK BY AND VERIFIED WITH: M MACCIA,PHARMD AT 1547 11/21/17 BY L BENFIELD    Culture (Baker)  Final    HAEMOPHILUS INFLUENZAE BETA LACTAMASE POSITIVE HEALTH DEPARTMENT NOTIFIED Performed at [facility], [place] [address], [place], [place] [address]    Report Status 11/24/2017 FINAL  Final  Blood culture (routine x 2)     Status: Abnormal (Preliminary result)   Collection Time: 11/20/17  4:35 PM  Result Value Ref Range Status   Specimen Description BLOOD RIGHT HAND  Final   Special Requests   Final    BOTTLES DRAWN AEROBIC AND ANAEROBIC Blood Culture adequate volume   Culture  Setup Time   Final    GRAM NEGATIVE RODS IN BOTH AEROBIC AND ANAEROBIC BOTTLES CRITICAL RESULT CALLED TO, READ BACK BY AND VERIFIED WITH: M MACCIA,PHARMD AT 1547 11/21/17 BY L BENFIELD    Culture (Baker)  Final    HAEMOPHILUS INFLUENZAE BETA LACTAMASE POSITIVE HEALTH DEPARTMENT NOTIFIED Referred to [facility] in [place], [place] for Serotyping. Performed at [facility], [place] [address] [place], [place] [address]    Report Status PENDING  Incomplete  Blood Culture ID Panel (Reflexed)     Status: Abnormal   Collection Time: 11/20/17  4:35 PM  Result Value Ref Range Status   Enterococcus species NOT DETECTED NOT DETECTED Final   Listeria monocytogenes NOT DETECTED NOT DETECTED Final   Staphylococcus species NOT DETECTED NOT DETECTED Final   Staphylococcus aureus NOT DETECTED NOT DETECTED Final   Streptococcus species NOT DETECTED NOT DETECTED Final   Streptococcus agalactiae NOT DETECTED NOT DETECTED Final   Streptococcus pneumoniae NOT DETECTED NOT DETECTED Final   Streptococcus pyogenes NOT DETECTED NOT DETECTED Final   Acinetobacter baumannii NOT DETECTED NOT DETECTED Final   Enterobacteriaceae species NOT DETECTED NOT DETECTED Final   Enterobacter cloacae complex NOT DETECTED NOT  DETECTED Final   Escherichia coli NOT DETECTED NOT DETECTED Final   Klebsiella oxytoca NOT DETECTED NOT DETECTED Final  Klebsiella pneumoniae NOT DETECTED NOT DETECTED Final   Proteus species NOT DETECTED NOT DETECTED Final   Serratia marcescens NOT DETECTED NOT DETECTED Final   Haemophilus influenzae DETECTED (Baker) NOT DETECTED Final    Comment: CRITICAL RESULT CALLED TO, READ BACK BY AND VERIFIED WITH: M MACCIA,PHARMD AT 1547 11/21/17 BY L BENFIELD    Neisseria meningitidis NOT DETECTED NOT DETECTED Final   Pseudomonas aeruginosa NOT DETECTED NOT DETECTED Final   Candida albicans NOT DETECTED NOT DETECTED Final   Candida glabrata NOT DETECTED NOT DETECTED Final   Candida krusei NOT DETECTED NOT DETECTED Final   Candida parapsilosis NOT DETECTED NOT DETECTED Final   Candida tropicalis NOT DETECTED NOT DETECTED Final    Comment: Performed at [facility], [place] [address] [place], [place] [address]  Culture, sputum-assessment     Status: None   Collection Time: 11/20/17  6:32 PM  Result Value Ref Range Status   Specimen Description EXPECTORATED SPUTUM  Final   Special Requests Normal  Final   Sputum evaluation   Final    THIS SPECIMEN IS ACCEPTABLE FOR SPUTUM CULTURE Performed at [facility], [address], [place], [place] [address]    Report Status 11/20/2017 FINAL  Final  Culture, respiratory (NON-Expectorated)     Status: None   Collection Time: 11/20/17  6:32 PM  Result Value Ref Range Status   Specimen Description EXPECTORATED SPUTUM  Final   Special Requests Normal Reflexed from [id number]  Final   Gram Stain   Final    MODERATE WBC PRESENT, PREDOMINANTLY PMN RARE SQUAMOUS EPITHELIAL CELLS PRESENT ABUNDANT GRAM POSITIVE COCCI IN PAIRS IN CLUSTERS MODERATE GRAM NEGATIVE RODS    Culture   Final    Consistent with normal respiratory flora. Performed at [facility], [place] [address] [place], [place] [address]    Report Status 11/23/2017 FINAL  Final  MRSA  PCR Screening     Status: None   Collection Time: 11/20/17  8:28 PM  Result Value Ref Range Status   MRSA by PCR NEGATIVE NEGATIVE Final    Comment:        The GeneXpert MRSA Assay (FDA approved for NASAL specimens only), is one component of Baker comprehensive MRSA colonization surveillance program. It is not intended to diagnose MRSA infection nor to guide or monitor treatment for MRSA infections. Performed at [facility], [place] [address], [place], [place] [address]   Culture, blood (routine x 2)     Status: None (Preliminary result)   Collection Time: 11/24/17  1:00 PM  Result Value Ref Range Status   Specimen Description BLOOD LEFT ANTECUBITAL  Final   Special Requests   Final    BOTTLES DRAWN AEROBIC AND ANAEROBIC Blood Culture adequate volume   Culture   Final    NO GROWTH 3 DAYS Performed at [facility], [place] [address], [place], [place] [address]    Report Status PENDING  Incomplete  Culture, blood (routine x 2)     Status: Abnormal   Collection Time: 11/24/17  1:09 PM  Result Value Ref Range Status   Specimen Description BLOOD RIGHT ANTECUBITAL  Final   Special Requests   Final    BOTTLES DRAWN AEROBIC ONLY Blood Culture results may not be optimal due to an inadequate volume of blood received in culture bottles   Culture  Setup Time   Final    GRAM POSITIVE COCCI AEROBIC BOTTLE ONLY CRITICAL RESULT CALLED TO, READ  BACK BY AND VERIFIED WITH: Ferne Coe PharmD 10:25 11/25/17 (wilsonm)    Culture (Baker)  Final    STAPHYLOCOCCUS SPECIES (COAGULASE NEGATIVE) THE SIGNIFICANCE OF ISOLATING THIS ORGANISM FROM Baker SINGLE SET OF BLOOD CULTURES WHEN MULTIPLE SETS ARE DRAWN IS UNCERTAIN. PLEASE NOTIFY THE MICROBIOLOGY DEPARTMENT WITHIN ONE WEEK IF SPECIATION AND SENSITIVITIES ARE REQUIRED. Performed at [facility], [place] [address] [place], [place] [address]    Report Status 11/27/2017 FINAL  Final  Blood Culture ID Panel (Reflexed)     Status: Abnormal   Collection  Time: 11/24/17  1:09 PM  Result Value Ref Range Status   Enterococcus species NOT DETECTED NOT DETECTED Final   Listeria monocytogenes NOT DETECTED NOT DETECTED Final   Staphylococcus species DETECTED (Baker) NOT DETECTED Final    Comment: Methicillin (oxacillin) resistant coagulase negative staphylococcus. Possible blood culture contaminant (unless isolated from more than one blood culture draw or clinical case suggests pathogenicity). No antibiotic treatment is indicated for blood  culture contaminants. CRITICAL RESULT CALLED TO, READ BACK BY AND VERIFIED WITH: Ferne Coe PharmD 10:25 11/25/17 (wilsonm)    Staphylococcus aureus NOT DETECTED NOT DETECTED Final   Methicillin resistance DETECTED (Baker) NOT DETECTED Final    Comment: CRITICAL RESULT CALLED TO, READ BACK BY AND VERIFIED WITH: Ferne Coe PharmD 10:25 11/25/17 (wilsonm)    Streptococcus species NOT DETECTED NOT DETECTED Final   Streptococcus agalactiae NOT DETECTED NOT DETECTED Final   Streptococcus pneumoniae NOT DETECTED NOT DETECTED Final   Streptococcus pyogenes NOT DETECTED NOT DETECTED Final   Acinetobacter baumannii NOT DETECTED NOT DETECTED Final   Enterobacteriaceae species NOT DETECTED NOT DETECTED Final   Enterobacter cloacae complex NOT DETECTED NOT DETECTED Final   Escherichia coli NOT DETECTED NOT DETECTED Final   Klebsiella oxytoca NOT DETECTED NOT DETECTED Final   Klebsiella pneumoniae NOT DETECTED NOT DETECTED Final   Proteus species NOT DETECTED NOT DETECTED Final   Serratia marcescens NOT DETECTED NOT DETECTED Final   Haemophilus influenzae NOT DETECTED NOT DETECTED Final   Neisseria meningitidis NOT DETECTED NOT DETECTED Final   Pseudomonas aeruginosa NOT DETECTED NOT DETECTED Final   Candida albicans NOT DETECTED NOT DETECTED Final   Candida glabrata NOT DETECTED NOT DETECTED Final   Candida krusei NOT DETECTED NOT DETECTED Final   Candida parapsilosis NOT DETECTED NOT DETECTED Final   Candida tropicalis NOT  DETECTED NOT DETECTED Final    Comment: Performed at [facility], [address] [place], [place] [address]  Gram stain     Status: None   Collection Time: 11/25/17  3:43 PM  Result Value Ref Range Status   Specimen Description FLUID PLEURAL  Final   Special Requests NONE  Final   Gram Stain   Final    ABUNDANT WBC PRESENT,BOTH PMN AND MONONUCLEAR NO ORGANISMS SEEN Performed at [facility], [address], [place], [place] [address]    Report Status 11/25/2017 FINAL  Final  Body fluid culture     Status: None (Preliminary result)   Collection Time: 11/26/17  1:01 PM  Result Value Ref Range Status   Specimen Description PLEURAL FLUID LEFT  Final   Special Requests NONE  Final   Gram Stain   Final    MODERATE WBC PRESENT,BOTH PMN AND MONONUCLEAR NO ORGANISMS SEEN    Culture   Final    NO GROWTH < 24 HOURS Performed at [facility], [place] [address], [place], [place] [address]    Report  Status PENDING  Incomplete  Surgical pcr screen     Status: None   Collection Time: 11/27/17  2:54 PM  Result Value Ref Range Status   MRSA, PCR NEGATIVE NEGATIVE Final   Staphylococcus aureus NEGATIVE NEGATIVE Final    Comment: (NOTE) The Xpert SA Assay (FDA approved for NASAL specimens in patients 11 years of age and older), is one component of Baker comprehensive surveillance program. It is not intended to diagnose infection nor to guide or monitor treatment. Performed at [facility], [place] [address] [place], [place] [address]          Radiology Studies: Dg Chest 1 View  Result Date: 11/26/2017 CLINICAL DATA:  Status post thoracentesis. EXAM: CHEST  1 VIEW COMPARISON:  Yesterday. FINDINGS: Significant decrease in amount of left pleural fluid following thoracentesis. No pneumothorax. Stable enlarged cardiac silhouette. Dense airspace opacity at the left lung base, increased. Clear right lung with an overlying skin fold. Bilateral shoulder degenerative changes.  IMPRESSION: 1. No pneumothorax following left thoracentesis. 2. Dense left lower lobe atelectasis or pneumonia, significantly increased. 3. Stable cardiomegaly. Electronically Signed   By: Claudie Revering M.D.   On: 11/26/2017 13:06   Dg Chest Port 1 View  Result Date: 11/27/2017 CLINICAL DATA:  Pneumonia EXAM: PORTABLE CHEST 1 VIEW COMPARISON:  November 26, 2017 FINDINGS: There is layering pleural effusion on the left. There is consolidation in the left base. There is mild right base atelectasis. Right lung otherwise is clear. Heart is mildly enlarged with pulmonary vascularity normal. Pacemaker lead attached to right ventricle. No pneumothorax. No adenopathy. No bone lesions. IMPRESSION: Layering pleural effusion on the left with persistent consolidation left base. Mild right base atelectasis. Stable cardiac prominence. Electronically Signed   By: Lowella Grip III M.D.   On: 11/27/2017 09:27   Ir Thoracentesis Asp Pleural Space W/img Guide  Result Date: 11/26/2017 INDICATION: Patient with left pleural effusion. Request is made for diagnostic and therapeutic thoracentesis. EXAM: ULTRASOUND GUIDED DIAGNOSTIC AND THERAPEUTIC LEFT THORACENTESIS MEDICATIONS: 10 mL 2% lidocaine COMPLICATIONS: None immediate. PROCEDURE: An ultrasound guided thoracentesis was thoroughly discussed with the patient and questions answered. The benefits, risks, alternatives and complications were also discussed. The patient understands and wishes to proceed with the procedure. Written consent was obtained. Ultrasound was performed to localize and mark an adequate pocket of fluid in the left chest. The area was then prepped and draped in the normal sterile fashion. 2% lidocaine was used for local anesthesia. Under ultrasound guidance Baker Safe-T-Centesis catheter was introduced. Thoracentesis was performed. The catheter was removed and Baker dressing applied. FINDINGS: Baker total of approximately 70 mL of cloudy yellow fluid was removed. Samples were  sent to the laboratory as requested by the clinical team. IMPRESSION: Successful ultrasound guided diagnostic and therapeutic left thoracentesis yielding 70 mL of pleural fluid. Read by: Brynda Greathouse PA-C Electronically Signed   By: Markus Daft M.D.   On: 11/26/2017 14:15        Scheduled Meds: . atorvastatin  20 mg Oral q1800  . carvedilol  3.125 mg Oral BID  . fenofibrate  54 mg Oral Daily  . furosemide  20 mg Oral Daily  . guaiFENesin  600 mg Oral BID  . insulin aspart  0-5 Units Subcutaneous QHS  . insulin aspart  0-9 Units Subcutaneous TID WC  . insulin aspart  4 Units Subcutaneous TID WC  . insulin glargine  22 Units Subcutaneous QHS  . ipratropium-albuterol  3 mL Nebulization BID  .  mouth rinse  15 mL Mouth Rinse BID  . methylPREDNISolone (SOLU-MEDROL) injection  40 mg Intravenous Daily  . Warfarin - Pharmacist Dosing Inpatient   Does not apply q1800   Continuous Infusions: . cefTRIAXone (ROCEPHIN)  IV Stopped (11/27/17 1556)     LOS: 8 days    Time spent: 25 minutes    Oscar Schwanz Baker, Oscar Baker [facility] Pager [phone]  If 7PM-7AM, please contact night-coverage [email or web address] Password [email or web address] 11/28/2017, 11:05 AM  Patient ID: Oscar Baker, male   DOB: [birth year], 84 y.o.   MRN: [id number]

## 2017-11-28 NOTE — Progress Notes (Signed)
Notified MD of CBG of 419.  Verbal orders received to administer existing order for 8 units of insulin, SSI orders of 9 units of insulin, with an additional 10 units of insulin (all Novolog).  Orders read back with confirmation and reviewed with pharmacy.    1730 - Notified MD of CBG of 164 with a total of 12 units of Novolog scheduled.  States ok to give.  Gave verbal orders to lower scheduled dose of Lantus to 20 units, push snacks up to MN (pt is NPO after MN), and check CBG q4hrs starting at 2200.  Orders again read back for confirmation.

## 2017-11-29 ENCOUNTER — Inpatient Hospital Stay (HOSPITAL_COMMUNITY): Payer: Medicare Other | Admitting: Anesthesiology

## 2017-11-29 ENCOUNTER — Encounter (HOSPITAL_COMMUNITY): Payer: Self-pay | Admitting: Certified Registered Nurse Anesthetist

## 2017-11-29 ENCOUNTER — Encounter (HOSPITAL_COMMUNITY)
Admission: EM | Disposition: A | Discharge status: Home Health | Payer: Self-pay | Source: Home / Self Care | Attending: Cardiothoracic Surgery

## 2017-11-29 ENCOUNTER — Inpatient Hospital Stay (HOSPITAL_COMMUNITY): Payer: Medicare Other

## 2017-11-29 ENCOUNTER — Ambulatory Visit: Payer: Medicare Other | Admitting: Physical Therapy

## 2017-11-29 DIAGNOSIS — Z9889 Other specified postprocedural states: Secondary | ICD-10-CM

## 2017-11-29 HISTORY — PX: VIDEO ASSISTED THORACOSCOPY (VATS)/EMPYEMA: SHX6172

## 2017-11-29 LAB — CBC WITH DIFFERENTIAL/PLATELET
BASOS ABS: 0 10*3/uL (ref 0.0–0.1)
Basophils Relative: 0 %
EOS ABS: 0 10*3/uL (ref 0.0–0.7)
Eosinophils Relative: 0 %
HCT: 31.5 % — ABNORMAL LOW (ref 39.0–52.0)
Hemoglobin: 10.3 g/dL — ABNORMAL LOW (ref 13.0–17.0)
LYMPHS PCT: 67 %
Lymphs Abs: 34.4 10*3/uL — ABNORMAL HIGH (ref 0.7–4.0)
MCH: 32.1 pg (ref 26.0–34.0)
MCHC: 32.7 g/dL (ref 30.0–36.0)
MCV: 98.1 fL (ref 78.0–100.0)
Monocytes Absolute: 1 10*3/uL (ref 0.1–1.0)
Monocytes Relative: 2 %
NEUTROS PCT: 31 %
Neutro Abs: 15.9 10*3/uL — ABNORMAL HIGH (ref 1.7–7.7)
PLATELETS: 715 10*3/uL — AB (ref 150–400)
RBC: 3.21 MIL/uL — AB (ref 4.22–5.81)
RDW: 18.9 % — ABNORMAL HIGH (ref 11.5–15.5)
WBC: 51.3 10*3/uL — AB (ref 4.0–10.5)

## 2017-11-29 LAB — CULTURE, BLOOD (ROUTINE X 2)
CULTURE: NO GROWTH
Special Requests: ADEQUATE

## 2017-11-29 LAB — POCT I-STAT 3, ART BLOOD GAS (G3+)
Acid-base deficit: 1 mmol/L (ref 0.0–2.0)
Bicarbonate: 23.5 mmol/L (ref 20.0–28.0)
O2 Saturation: 97 %
PCO2 ART: 37.7 mmHg (ref 32.0–48.0)
PO2 ART: 92 mmHg (ref 83.0–108.0)
Patient temperature: 97.8
TCO2: 25 mmol/L (ref 22–32)
pH, Arterial: 7.401 (ref 7.350–7.450)

## 2017-11-29 LAB — PATHOLOGIST SMEAR REVIEW: PATH REVIEW: INCREASED

## 2017-11-29 LAB — CBC
HCT: 26.4 % — ABNORMAL LOW (ref 39.0–52.0)
Hemoglobin: 8.5 g/dL — ABNORMAL LOW (ref 13.0–17.0)
MCH: 31.8 pg (ref 26.0–34.0)
MCHC: 32.2 g/dL (ref 30.0–36.0)
MCV: 98.9 fL (ref 78.0–100.0)
Platelets: 563 10*3/uL — ABNORMAL HIGH (ref 150–400)
RBC: 2.67 MIL/uL — ABNORMAL LOW (ref 4.22–5.81)
RDW: 19 % — ABNORMAL HIGH (ref 11.5–15.5)
WBC: 61.5 10*3/uL (ref 4.0–10.5)

## 2017-11-29 LAB — BASIC METABOLIC PANEL
ANION GAP: 10 (ref 5–15)
Anion gap: 8 (ref 5–15)
BUN: 30 mg/dL — ABNORMAL HIGH (ref 6–20)
BUN: 34 mg/dL — ABNORMAL HIGH (ref 6–20)
CALCIUM: 9.1 mg/dL (ref 8.9–10.3)
CO2: 29 mmol/L (ref 22–32)
CO2: 23 mmol/L (ref 22–32)
Calcium: 8.2 mg/dL — ABNORMAL LOW (ref 8.9–10.3)
Chloride: 99 mmol/L — ABNORMAL LOW (ref 101–111)
Chloride: 103 mmol/L (ref 101–111)
Creatinine, Ser: 0.98 mg/dL (ref 0.61–1.24)
Creatinine, Ser: 1.08 mg/dL (ref 0.61–1.24)
GFR calc Af Amer: >60 mL/min (ref >60)
GFR calc non Af Amer: >60 mL/min (ref >60)
Glucose, Bld: 102 mg/dL — ABNORMAL HIGH (ref 65–99)
Glucose, Bld: 309 mg/dL — ABNORMAL HIGH (ref 65–99)
POTASSIUM: 4.7 mmol/L (ref 3.5–5.1)
Potassium: 5.6 mmol/L — ABNORMAL HIGH (ref 3.5–5.1)
SODIUM: 138 mmol/L (ref 135–145)
Sodium: 134 mmol/L — ABNORMAL LOW (ref 135–145)

## 2017-11-29 LAB — GLUCOSE, CAPILLARY
GLUCOSE-CAPILLARY: 100 mg/dL — AB (ref 65–99)
GLUCOSE-CAPILLARY: 96 mg/dL (ref 65–99)
GLUCOSE-CAPILLARY: 124 mg/dL — AB (ref 65–99)
GLUCOSE-CAPILLARY: 216 mg/dL — AB (ref 65–99)
GLUCOSE-CAPILLARY: 268 mg/dL — AB (ref 65–99)
Glucose-Capillary: 224 mg/dL — ABNORMAL HIGH (ref 65–99)
Glucose-Capillary: 140 mg/dL — ABNORMAL HIGH (ref 65–99)
Glucose-Capillary: 184 mg/dL — ABNORMAL HIGH (ref 65–99)

## 2017-11-29 LAB — BODY FLUID CULTURE: Culture: NO GROWTH

## 2017-11-29 LAB — PROTIME-INR
INR: 1.33
PROTHROMBIN TIME: 16.4 s — AB (ref 11.4–15.2)

## 2017-11-29 SURGERY — VIDEO ASSISTED THORACOSCOPY (VATS)/EMPYEMA
Anesthesia: General | Site: Chest | Laterality: Left

## 2017-11-29 MED ORDER — HYDROMORPHONE HCL 2 MG/ML IJ SOLN
0.2500 mg | INTRAMUSCULAR | Status: DC | PRN
  Administered 2017-11-29 (×3): 0.5 mg via INTRAVENOUS

## 2017-11-29 MED ORDER — ALBUMIN HUMAN 5 % IV SOLN
12.5000 g | Freq: Once | INTRAVENOUS | Status: AC
  Administered 2017-11-30: 12.5 g via INTRAVENOUS

## 2017-11-29 MED ORDER — METHYLPREDNISOLONE SODIUM SUCC 40 MG IJ SOLR: 40.0000 mg | Freq: Every day | INTRAMUSCULAR | Status: DC

## 2017-11-29 MED ORDER — DEXAMETHASONE SODIUM PHOSPHATE 10 MG/ML IJ SOLN
INTRAMUSCULAR | Status: AC
  Filled 2017-11-29: qty 1

## 2017-11-29 MED ORDER — PHENYLEPHRINE 40 MCG/ML (10ML) SYRINGE FOR IV PUSH (FOR BLOOD PRESSURE SUPPORT)
PREFILLED_SYRINGE | INTRAVENOUS | Status: AC
  Filled 2017-11-29: qty 30

## 2017-11-29 MED ORDER — ACETAMINOPHEN 500 MG PO TABS
1000.0000 mg | ORAL_TABLET | Freq: Four times a day (QID) | ORAL | Status: DC
  Administered 2017-11-30: 1000 mg via ORAL
  Filled 2017-11-29: qty 2

## 2017-11-29 MED ORDER — PROPOFOL 10 MG/ML IV BOLUS
INTRAVENOUS | Status: AC
  Filled 2017-11-29: qty 20

## 2017-11-29 MED ORDER — ONDANSETRON HCL 4 MG/2ML IJ SOLN
INTRAMUSCULAR | Status: AC
  Filled 2017-11-29: qty 2

## 2017-11-29 MED ORDER — PHENYLEPHRINE 40 MCG/ML (10ML) SYRINGE FOR IV PUSH (FOR BLOOD PRESSURE SUPPORT)
PREFILLED_SYRINGE | INTRAVENOUS | Status: DC | PRN
  Administered 2017-11-29: 40 ug via INTRAVENOUS
  Administered 2017-11-29: 120 ug via INTRAVENOUS

## 2017-11-29 MED ORDER — IPRATROPIUM-ALBUTEROL 0.5-2.5 (3) MG/3ML IN SOLN: 3.0000 mL | Freq: Two times a day (BID) | RESPIRATORY_TRACT | Status: DC | PRN

## 2017-11-29 MED ORDER — OXYCODONE HCL 5 MG PO TABS: 5.0000 mg | ORAL_TABLET | ORAL | Status: DC | PRN

## 2017-11-29 MED ORDER — ATORVASTATIN CALCIUM 20 MG PO TABS
20.0000 mg | ORAL_TABLET | Freq: Every day | ORAL | Status: DC
  Administered 2017-11-30 – 2017-12-04 (×5): 20 mg via ORAL
  Filled 2017-11-29 (×5): qty 1

## 2017-11-29 MED ORDER — ONDANSETRON HCL 4 MG/2ML IJ SOLN
4.0000 mg | Freq: Four times a day (QID) | INTRAMUSCULAR | Status: DC | PRN
  Administered 2017-11-30: 4 mg via INTRAVENOUS
  Filled 2017-11-29: qty 2

## 2017-11-29 MED ORDER — KETOROLAC TROMETHAMINE 15 MG/ML IJ SOLN
INTRAMUSCULAR | Status: DC | PRN
  Administered 2017-11-29: 15 mg via INTRAVENOUS

## 2017-11-29 MED ORDER — SODIUM CHLORIDE 0.9 % IV SOLN
500.0000 mg | Freq: Two times a day (BID) | INTRAVENOUS | Status: DC
  Administered 2017-11-29 – 2017-11-30 (×2): 500 mg via INTRAVENOUS
  Filled 2017-11-29 (×2): qty 500

## 2017-11-29 MED ORDER — LACTATED RINGERS IV SOLN
INTRAVENOUS | Status: DC | PRN
  Administered 2017-11-29: 17:00:00 via INTRAVENOUS

## 2017-11-29 MED ORDER — TRAMADOL HCL 50 MG PO TABS
50.0000 mg | ORAL_TABLET | Freq: Four times a day (QID) | ORAL | Status: DC | PRN
  Administered 2017-11-29: 100 mg via ORAL
  Filled 2017-11-29: qty 2

## 2017-11-29 MED ORDER — SUGAMMADEX SODIUM 200 MG/2ML IV SOLN
INTRAVENOUS | Status: AC
  Filled 2017-11-29: qty 2

## 2017-11-29 MED ORDER — EPHEDRINE SULFATE 50 MG/ML IJ SOLN
INTRAMUSCULAR | Status: AC
  Filled 2017-11-29: qty 2

## 2017-11-29 MED ORDER — LIDOCAINE HCL (CARDIAC) PF 100 MG/5ML IV SOSY
PREFILLED_SYRINGE | INTRAVENOUS | Status: DC | PRN
  Administered 2017-11-29: 50 mg via INTRAVENOUS

## 2017-11-29 MED ORDER — BISACODYL 5 MG PO TBEC
10.0000 mg | DELAYED_RELEASE_TABLET | Freq: Every day | ORAL | Status: DC
  Administered 2017-11-29: 10 mg via ORAL
  Filled 2017-11-29: qty 2

## 2017-11-29 MED ORDER — SENNOSIDES-DOCUSATE SODIUM 8.6-50 MG PO TABS
1.0000 | ORAL_TABLET | Freq: Every day | ORAL | Status: DC
  Administered 2017-11-29: 1 via ORAL
  Filled 2017-11-29: qty 1

## 2017-11-29 MED ORDER — MIDAZOLAM HCL 2 MG/2ML IJ SOLN
INTRAMUSCULAR | Status: AC
  Filled 2017-11-29: qty 2

## 2017-11-29 MED ORDER — KETOROLAC TROMETHAMINE 30 MG/ML IJ SOLN
INTRAMUSCULAR | Status: AC
Start: 2017-11-29 — End: ?
  Filled 2017-11-29: qty 1

## 2017-11-29 MED ORDER — PIPERACILLIN-TAZOBACTAM 3.375 G IVPB
3.3750 g | Freq: Three times a day (TID) | INTRAVENOUS | Status: DC
  Administered 2017-11-30 (×2): 3.375 g via INTRAVENOUS
  Filled 2017-11-29 (×3): qty 50

## 2017-11-29 MED ORDER — NOREPINEPHRINE 4 MG/250ML-% IV SOLN
0.0000 ug/min | INTRAVENOUS | Status: DC
  Administered 2017-11-30: 5 ug/min via INTRAVENOUS
  Administered 2017-11-30: 8 ug/min via INTRAVENOUS
  Administered 2017-11-30: 20 ug/min via INTRAVENOUS
  Filled 2017-11-29 (×3): qty 250

## 2017-11-29 MED ORDER — INSULIN ASPART 100 UNIT/ML ~~LOC~~ SOLN
0.0000 [IU] | SUBCUTANEOUS | Status: DC
  Administered 2017-11-29 – 2017-11-30 (×2): 12 [IU] via SUBCUTANEOUS
  Administered 2017-11-30: 16 [IU] via SUBCUTANEOUS

## 2017-11-29 MED ORDER — ACETAMINOPHEN 160 MG/5ML PO SOLN: 1000.0000 mg | Freq: Four times a day (QID) | ORAL | Status: DC

## 2017-11-29 MED ORDER — ALBUMIN HUMAN 5 % IV SOLN
INTRAVENOUS | Status: AC
  Administered 2017-11-30: 12.5 g via INTRAVENOUS
  Filled 2017-11-29: qty 250

## 2017-11-29 MED ORDER — POTASSIUM CHLORIDE 10 MEQ/50ML IV SOLN: 10.0000 meq | Freq: Every day | INTRAVENOUS | Status: DC | PRN

## 2017-11-29 MED ORDER — ONDANSETRON HCL 4 MG/2ML IJ SOLN
INTRAMUSCULAR | Status: DC | PRN
  Administered 2017-11-29: 4 mg via INTRAVENOUS

## 2017-11-29 MED ORDER — PROMETHAZINE HCL 25 MG/ML IJ SOLN: 6.2500 mg | INTRAMUSCULAR | Status: DC | PRN

## 2017-11-29 MED ORDER — PROPOFOL 10 MG/ML IV BOLUS
INTRAVENOUS | Status: DC | PRN
  Administered 2017-11-29: 50 mg via INTRAVENOUS

## 2017-11-29 MED ORDER — 0.9 % SODIUM CHLORIDE (POUR BTL) OPTIME
TOPICAL | Status: DC | PRN
  Administered 2017-11-29: 2000 mL

## 2017-11-29 MED ORDER — SODIUM CHLORIDE 0.9 % IV BOLUS
500.0000 mL | Freq: Once | INTRAVENOUS | Status: AC
  Administered 2017-11-29: 500 mL via INTRAVENOUS

## 2017-11-29 MED ORDER — LIDOCAINE 2% (20 MG/ML) 5 ML SYRINGE
INTRAMUSCULAR | Status: AC
  Filled 2017-11-29: qty 5

## 2017-11-29 MED ORDER — FENTANYL CITRATE (PF) 100 MCG/2ML IJ SOLN
INTRAMUSCULAR | Status: AC
  Filled 2017-11-29: qty 2

## 2017-11-29 MED ORDER — FENTANYL CITRATE (PF) 100 MCG/2ML IJ SOLN
25.0000 ug | INTRAMUSCULAR | Status: DC | PRN
  Administered 2017-11-29 – 2017-11-30 (×2): 25 ug via INTRAVENOUS
  Filled 2017-11-29 (×2): qty 2

## 2017-11-29 MED ORDER — SUGAMMADEX SODIUM 200 MG/2ML IV SOLN
INTRAVENOUS | Status: DC | PRN
  Administered 2017-11-29: 150 mg via INTRAVENOUS

## 2017-11-29 MED ORDER — FENTANYL CITRATE (PF) 250 MCG/5ML IJ SOLN
INTRAMUSCULAR | Status: AC
  Filled 2017-11-29: qty 5

## 2017-11-29 MED ORDER — HYDROMORPHONE HCL 2 MG/ML IJ SOLN
INTRAMUSCULAR | Status: AC
  Filled 2017-11-29: qty 1

## 2017-11-29 MED ORDER — ROCURONIUM BROMIDE 100 MG/10ML IV SOLN
INTRAVENOUS | Status: DC | PRN
  Administered 2017-11-29: 30 mg via INTRAVENOUS

## 2017-11-29 MED ORDER — FENTANYL CITRATE (PF) 100 MCG/2ML IJ SOLN
INTRAMUSCULAR | Status: DC | PRN
  Administered 2017-11-29: 50 ug via INTRAVENOUS
  Administered 2017-11-29: 100 ug via INTRAVENOUS
  Administered 2017-11-29 (×2): 50 ug via INTRAVENOUS

## 2017-11-29 MED ORDER — DEXAMETHASONE SODIUM PHOSPHATE 4 MG/ML IJ SOLN
INTRAMUSCULAR | Status: DC | PRN
  Administered 2017-11-29: 10 mg via INTRAVENOUS

## 2017-11-29 MED ORDER — PHENYLEPHRINE HCL 10 MG/ML IJ SOLN
INTRAVENOUS | Status: DC | PRN
  Administered 2017-11-29: 25 ug/min via INTRAVENOUS

## 2017-11-29 MED ORDER — LACTATED RINGERS IV SOLN
INTRAVENOUS | Status: DC
  Administered 2017-11-29: 15:00:00 via INTRAVENOUS

## 2017-11-29 MED ORDER — ALBUMIN HUMAN 5 % IV SOLN
INTRAVENOUS | Status: DC | PRN
  Administered 2017-11-29: 18:00:00 via INTRAVENOUS

## 2017-11-29 MED ORDER — DEXTROSE-NACL 5-0.45 % IV SOLN: INTRAVENOUS | Status: DC

## 2017-11-29 SURGICAL SUPPLY — 66 items
ADH SKN CLS APL DERMABOND .7 (GAUZE/BANDAGES/DRESSINGS)
BAG DECANTER FOR FLEXI CONT (MISCELLANEOUS) IMPLANT
BLADE SURG 11 STRL SS (BLADE) ×3 IMPLANT
CANISTER SUCT 3000ML PPV (MISCELLANEOUS) ×3 IMPLANT
CATH KIT ON Q 5IN SLV (PAIN MANAGEMENT) IMPLANT
CATH ROBINSON RED A/P 22FR (CATHETERS) IMPLANT
CATH THORACIC 28FR (CATHETERS) ×2 IMPLANT
CATH THORACIC 36FR (CATHETERS) IMPLANT
CATH THORACIC 36FR RT ANG (CATHETERS) IMPLANT
CONT SPEC 4OZ CLIKSEAL STRL BL (MISCELLANEOUS) ×8 IMPLANT
DERMABOND ADVANCED (GAUZE/BANDAGES/DRESSINGS)
DERMABOND ADVANCED .7 DNX12 (GAUZE/BANDAGES/DRESSINGS) IMPLANT
DRAIN CHANNEL 32F RND 10.7 FF (WOUND CARE) ×2 IMPLANT
DRAPE LAPAROSCOPIC ABDOMINAL (DRAPES) ×3 IMPLANT
DRAPE WARM FLUID 44X44 (DRAPE) ×3 IMPLANT
ELECT BLADE 4.0 EZ CLEAN MEGAD (MISCELLANEOUS) ×3
ELECT CAUTERY BLADE 6.4 (BLADE) ×2 IMPLANT
ELECT REM PT RETURN 9FT ADLT (ELECTROSURGICAL) ×3
ELECTRODE BLDE 4.0 EZ CLN MEGD (MISCELLANEOUS) IMPLANT
ELECTRODE REM PT RTRN 9FT ADLT (ELECTROSURGICAL) ×1 IMPLANT
GAUZE SPONGE 4X4 12PLY STRL (GAUZE/BANDAGES/DRESSINGS) ×3 IMPLANT
GOWN STRL REUS W/ TWL LRG LVL3 (GOWN DISPOSABLE) ×3 IMPLANT
GOWN STRL REUS W/TWL LRG LVL3 (GOWN DISPOSABLE) ×9
KIT BASIN OR (CUSTOM PROCEDURE TRAY) ×3 IMPLANT
KIT CATH SUCT 8FR (CATHETERS) ×2 IMPLANT
KIT SUCTION CATH 14FR (SUCTIONS) ×3 IMPLANT
KIT TURNOVER KIT B (KITS) ×3 IMPLANT
NS IRRIG 1000ML POUR BTL (IV SOLUTION) ×12 IMPLANT
PACK CHEST (CUSTOM PROCEDURE TRAY) ×3 IMPLANT
PAD ARMBOARD 7.5X6 YLW CONV (MISCELLANEOUS) ×6 IMPLANT
SEALANT SURG COSEAL 4ML (VASCULAR PRODUCTS) IMPLANT
SOLUTION ANTI FOG 6CC (MISCELLANEOUS) ×3 IMPLANT
SPONGE TONSIL 1 RF SGL (DISPOSABLE) ×2 IMPLANT
SPONGE TONSIL 1.25 RF SGL STRG (GAUZE/BANDAGES/DRESSINGS) ×3 IMPLANT
SUT CHROMIC 3 0 SH 27 (SUTURE) IMPLANT
SUT ETHILON 3 0 PS 1 (SUTURE) IMPLANT
SUT PROLENE 3 0 SH DA (SUTURE) IMPLANT
SUT PROLENE 4 0 RB 1 (SUTURE)
SUT PROLENE 4-0 RB1 .5 CRCL 36 (SUTURE) IMPLANT
SUT PROLENE 6 0 C 1 30 (SUTURE) IMPLANT
SUT SILK  1 MH (SUTURE) ×4
SUT SILK 1 MH (SUTURE) ×2 IMPLANT
SUT SILK 1 TIES 10X30 (SUTURE) IMPLANT
SUT SILK 2 0SH CR/8 30 (SUTURE) IMPLANT
SUT SILK 3 0SH CR/8 30 (SUTURE) IMPLANT
SUT VIC AB 1 CTX 18 (SUTURE) ×2 IMPLANT
SUT VIC AB 1 CTX 36 (SUTURE) ×3
SUT VIC AB 1 CTX36XBRD ANBCTR (SUTURE) IMPLANT
SUT VIC AB 2 TP1 27 (SUTURE) IMPLANT
SUT VIC AB 2-0 CT2 18 VCP726D (SUTURE) IMPLANT
SUT VIC AB 2-0 CTX 36 (SUTURE) IMPLANT
SUT VIC AB 3-0 SH 18 (SUTURE) IMPLANT
SUT VIC AB 3-0 X1 27 (SUTURE) IMPLANT
SUT VICRYL 0 UR6 27IN ABS (SUTURE) IMPLANT
SUT VICRYL 2 TP 1 (SUTURE) ×2 IMPLANT
SWAB COLLECTION DEVICE MRSA (MISCELLANEOUS) IMPLANT
SWAB CULTURE ESWAB REG 1ML (MISCELLANEOUS) IMPLANT
SYSTEM SAHARA CHEST DRAIN ATS (WOUND CARE) ×3 IMPLANT
TAPE CLOTH SURG 4X10 WHT LF (GAUZE/BANDAGES/DRESSINGS) ×2 IMPLANT
TIP APPLICATOR SPRAY EXTEND 16 (VASCULAR PRODUCTS) IMPLANT
TOWEL GREEN STERILE (TOWEL DISPOSABLE) ×3 IMPLANT
TOWEL GREEN STERILE FF (TOWEL DISPOSABLE) ×3 IMPLANT
TRAP SPECIMEN MUCOUS 40CC (MISCELLANEOUS) IMPLANT
TRAY FOLEY MTR SLVR 16FR STAT (SET/KITS/TRAYS/PACK) ×3 IMPLANT
TROCAR BLADELESS 12MM (ENDOMECHANICALS) ×2 IMPLANT
WATER STERILE IRR 1000ML POUR (IV SOLUTION) ×6 IMPLANT

## 2017-11-29 NOTE — Progress Notes (Signed)
Pharmacy Antibiotic Note  Oscar Baker is a 84 y.o. male admitted on 11/20/2017 with empyema now s/p VATS 6/10.  Pharmacy has been consulted for Vancomycin / Zosyn dosing.  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    Allergies  Allergen Reactions  . Altace [Ramipril] Other (See Comments)    "throat felt like had a knot in it"  . Codeine Nausea And Vomiting    Nausea and vomiting   . Simvastatin Other (See Comments)    Leg aches     Thank you for allowing pharmacy to be a part of this patient's care. Anette Guarneri, PharmD [phone] 11/29/2017 9:44 PM

## 2017-11-29 NOTE — Progress Notes (Signed)
Pre Procedure note for inpatients:   Oscar Baker has been scheduled for Procedure(s): VIDEO ASSISTED THORACOSCOPY (VATS)/EMPYEMA (Left) today. The various methods of treatment have been discussed with the patient. After consideration of the risks, benefits and treatment options the patient has consented to the planned procedure.   The patient has been seen and labs reviewed. There are no changes in the patient's condition to prevent proceeding with the planned procedure today.  Recent labs:  Lab Results  Component Value Date   WBC 51.3 (HH) 11/29/2017   HGB 10.3 (L) 11/29/2017   HCT 31.5 (L) 11/29/2017   PLT 715 (H) 11/29/2017   GLUCOSE 102 (H) 11/29/2017   CHOL 145 05/28/2015   TRIG 107 05/28/2015   HDL 23 (L) 05/28/2015   LDLDIRECT 133.8 07/13/2012   LDLCALC 101 05/28/2015   ALT 20 11/28/2017   AST 22 11/28/2017   NA 138 11/29/2017   K 4.7 11/29/2017   CL 99 (L) 11/29/2017   CREATININE 0.98 11/29/2017   BUN 30 (H) 11/29/2017   CO2 29 11/29/2017   TSH 0.436 07/22/2017   INR 1.33 11/29/2017   HGBA1C 6.0 (H) 05/05/2016    Len Childs, MD 11/29/2017 4:21 PM

## 2017-11-29 NOTE — Brief Op Note (Addendum)
11/20/2017 - 11/29/2017  7:00 PM  PATIENT:  Oscar Baker  84 y.o. male  PRE-OPERATIVE DIAGNOSIS:  left empyema  POST-OPERATIVE DIAGNOSIS:  left empyema  PROCEDURE:  Procedure(s): VIDEO ASSISTED THORACOSCOPY (VATS)/EMPYEMA (Left)  SURGEON:  Surgeon(s) and Role:    Ivin Poot, MD - Primary  PHYSICIAN ASSISTANT:  Nicholes Rough, PA-C   Findings- turbid pockets of fluid L pleural space- both tissue and fluid sent for cultures and pathology/cytology                 Fibrinous thick peel on L lower lobe- removed- decorticated                 Min blood loss ANESTHESIA:   general  EBL:  127ml  BLOOD ADMINISTERED:none  DRAINS: ONE BLAKE AND ONE STRAIGHT CHEST TUBE   LOCAL MEDICATIONS USED:  NONE  SPECIMEN:  Source of Specimen:  pleural peel, pleural fluid  DISPOSITION OF SPECIMEN:  PATHOLOGY  COUNTS:  YES  TOURNIQUET:  * No tourniquets in log *  DICTATION: .Dragon Dictation  PLAN OF CARE: Admit to inpatient   PATIENT DISPOSITION:  ICU - intubated and hemodynamically stable.   Delay start of Pharmacological VTE agent (>24hrs) due to surgical blood loss or risk of bleeding: yes

## 2017-11-29 NOTE — Progress Notes (Signed)
Physical Therapy Treatment Patient Details Name: Oscar Baker MRN: [id number] DOB: Mar 29, 1933 Today's Date: 11/29/2017    History of Present Illness 84 year old male with a history of CLL, prostate cancer, sick sinus syndrome, pacemaker, paroxysmal atrial fibrillation, CHF with ejection fraction 45% admitted with fever increasing shortness of breath cough with yellow to green phlegm for 1 week prior to admission.  He was found to have  left lower lobe pneumonia with associated effusion    PT Comments    Pt is awaiting VATS procedure this afternoon and was eager to get out of room and go for a walk. Pt on RA and making good progress towards his goals. Pt currently supervision for transfers and min guard for ambulation of 400 feet with RW. Pt able to maintain SaO2 greater than 88% O2 throughout ambulation on RA and SaO2 quickly rebounded to 92%O2 with vc for pursed lipped breathing. PT will follow back with pt post surgery to reassess mobility.     Follow Up Recommendations  Outpatient PT;Supervision/Assistance - 24 hour     Equipment Recommendations  Other (comment)(pt came with RW and it has been lost in hospital may need ne)    Recommendations for Other Services       Precautions / Restrictions Precautions Precautions: Fall Restrictions Weight Bearing Restrictions: No    Mobility  Bed Mobility Overal bed mobility: (requires increased time and effort)             General bed mobility comments: OOB in recliner  Transfers Overall transfer level: Needs assistance   Transfers: Sit to/from Stand Sit to Stand: Supervision         General transfer comment: supervision for safety,   Ambulation/Gait Ambulation/Gait assistance: Min guard Ambulation Distance (Feet): 400 Feet Assistive device: Rolling walker (2 wheeled) Gait Pattern/deviations: Step-through pattern;Decreased stride length;Trunk flexed;Shuffle Gait velocity: slowed Gait velocity interpretation: 1.31 -  2.62 ft/sec, indicative of limited community ambulator General Gait Details: hands-on min guard for safety, vc for upright posture, slow, steady gait, slightly decreased foot clearance and shuffling gait, did not requires any rest breaks and only had 2/4 DoE at end of walk.        Balance Overall balance assessment: Needs assistance Sitting-balance support: No upper extremity supported;Feet supported Sitting balance-Leahy Scale: Fair     Standing balance support: Bilateral upper extremity supported Standing balance-Leahy Scale: Fair Standing balance comment: able to statically stand before reaching to RW                            Cognition Arousal/Alertness: Awake/alert Behavior During Therapy: WFL for tasks assessed/performed Overall Cognitive Status: Within Functional Limits for tasks assessed                                           General Comments General comments (skin integrity, edema, etc.): Pt able to ambulate on RA with SaO2 >88%O2 throughout, vc for pursed lipped breathing and quickly recovered to 92%O2      Pertinent Vitals/Pain Pain Assessment: No/denies pain           PT Goals (current goals can now be found in the care plan section) Acute Rehab PT Goals Patient Stated Goal: feel less tired PT Goal Formulation: With patient Time For Goal Achievement: 12/06/17 Potential to Achieve Goals: Fair Progress towards PT goals: Progressing toward goals  Frequency    Min 3X/week      PT Plan Current plan remains appropriate       AM-PAC PT "6 Clicks" Daily Activity  Outcome Measure  Difficulty turning over in bed (including adjusting bedclothes, sheets and blankets)?: A Lot Difficulty moving from lying on back to sitting on the side of the bed? : A Lot Difficulty sitting down on and standing up from a chair with arms (e.g., wheelchair, bedside commode, etc,.)?: Unable Help needed moving to and from a bed to chair  (including a wheelchair)?: A Little Help needed walking in hospital room?: A Little Help needed climbing 3-5 steps with a railing? : A Lot 6 Click Score: 13    End of Session Equipment Utilized During Treatment: Gait belt Activity Tolerance: Patient tolerated treatment well Patient left: in chair;with call bell/phone within reach;with chair alarm set;with nursing/sitter in room Nurse Communication: Mobility status;Patient requests pain meds PT Visit Diagnosis: Unsteadiness on feet (R26.81);Other abnormalities of gait and mobility (R26.89);Muscle weakness (generalized) (M62.81);Difficulty in walking, not elsewhere classified (R26.2);Repeated falls (R29.6);History of falling (Z91.81);Pain     Time: 7915-0569 PT Time Calculation (min) (ACUTE ONLY): 20 min  Charges:  $Gait Training: 8-22 mins                    G Codes:       Arletha Marschke B. Migdalia Dk PT, DPT Acute Rehabilitation  [phone] Pager [phone]     [place] 11/29/2017, 9:03 AM

## 2017-11-29 NOTE — Progress Notes (Signed)
Patient Demographics:    Loomis Anacker, is a 84 y.o. male, DOB - 07/03/1932, STM:[id number]  Admit date - 11/20/2017   Admitting Physician Jani Gravel, MD  Outpatient Primary MD for the patient is [name], MD  LOS - 9   Chief Complaint  Patient presents with  . Cough  . Fever        Subjective:    Felicie Morn today has no fevers, no emesis,  No chest pain, no new concerns  Assessment  & Plan :    Principal Problem:   HCAP (healthcare-associated pneumonia) Active Problems:   HTN (hypertension)   Hyperlipidemia   Diabetes mellitus type 2, noninsulin dependent (HCC)   LPRD (laryngopharyngeal reflux disease)   Other allergic rhinitis   Anemia   Chronic cough   CLL (chronic lymphocytic leukemia) (HCC)   Pleural effusion   Pneumonia of left lower lobe due to infectious organism [facility])   Mediastinal lymphadenopathy   Hypoxemia   Sepsis ([place])               Brief Narrative:   84 year-old male with a history of CLL, prostate cancer, sick sinus syndrome, pacemaker, paroxysmal atrial fibrillation, CHF with ejection fraction 45% admitted with fever increasing shortness of breath cough with yellow to green phlegm for 1 week prior to admission. He was found to have left lower lobe pneumonia with associated effusion and is admitted for the treatment of the same.  Thoracentesis was attempted on 11/25/2017 by pulmonology service but had to be aborted due to coughing.  IR did thoracentesis on 11/26/2017 with only 70 cc fluid obtained.  CT surgery consulted , for VIDEO ASSISTED THORACOSCOPY (VATS)/EMPYEMA (Left) on 11/29/17 due to persistent pleural effusion despite 2 failed attempts at thoracentesis with minimal fluid drained    Plan:- 1)HCAP/haemophilus influenza bacteremia complicated with loculated Lt pleural effusion, CT surgery consulted , for Lt sided VATS  on 11/29/17, continue IV Rocephin repeat blood  cultures negative so far, persistent leukocytosis may be secondary to CLL and steroids.  Pleural fluid cultures remains negative so far  2)PAFib-Coumadin on hold since 11/27/2017 to allow for VATS, continue Coreg for rate control,  3)HFrEF-last known EF about 45 to 50% from January 2019, continue Lasix 20 mg daily, continue Coreg  4)CLL--persistent leukocytosis mostly secondary to underlying CLL  5)DM-glycemic control worsened with steroids, continue Lantus 20 units, Use Novolog/Humalog Sliding scale insulin with Accu-Cheks/Fingersticks as ordered    Code Status : DNR   Disposition Plan  : TBD  Consults  :  CT surgery, PCCM/IR   DVT Prophylaxis  : Coumadin on hold to allow for VATs  Lab Results  Component Value Date   PLT 715 (H) 11/29/2017    Inpatient Medications  Scheduled Meds: . [MAR Hold] atorvastatin  20 mg Oral q1800  . [MAR Hold] carvedilol  3.125 mg Oral BID  . [MAR Hold] fenofibrate  54 mg Oral Daily  . [MAR Hold] furosemide  20 mg Oral Daily  . [MAR Hold] guaiFENesin  600 mg Oral BID  . [MAR Hold] insulin aspart  0-5 Units Subcutaneous QHS  . [MAR Hold] insulin aspart  0-9 Units Subcutaneous TID WC  . [MAR Hold] insulin aspart  10 Units Subcutaneous TID WC  . [  MAR Hold] insulin glargine  20 Units Subcutaneous QHS  . [MAR Hold] mouth rinse  15 mL Mouth Rinse BID  . [MAR Hold] methylPREDNISolone (SOLU-MEDROL) injection  40 mg Intravenous Daily   Continuous Infusions: .  ceFAZolin (ANCEF) IV    . [MAR Hold] cefTRIAXone (ROCEPHIN)  IV Stopped (11/28/17 1754)  . lactated ringers 10 mL/hr at 11/29/17 1447   PRN Meds:.[MAR Hold] chlorpheniramine-HYDROcodone, [MAR Hold] HYDROcodone-acetaminophen, [MAR Hold] ipratropium-albuterol, [MAR Hold] RESOURCE THICKENUP CLEAR    Anti-infectives (From admission, onward)   Start     Dose/Rate Route Frequency Ordered Stop   11/29/17 1300  ceFAZolin (ANCEF) IVPB 2g/100 mL premix     2 g 200 mL/hr over 30 Minutes Intravenous  30 min pre-op 11/28/17 1215     11/21/17 1600  [MAR Hold]  cefTRIAXone (ROCEPHIN) 2 g in sodium chloride 0.9 % 100 mL IVPB     (MAR Hold since Mon 11/29/2017 at 1426. Reason: Transfer to a Procedural area.)   2 g 200 mL/hr over 30 Minutes Intravenous Every 24 hours 11/21/17 1553     11/21/17 0600  vancomycin (VANCOCIN) IVPB 750 mg/150 ml premix  Status:  Discontinued     750 mg 150 mL/hr over 60 Minutes Intravenous Every 12 hours 11/20/17 1703 11/21/17 1553   11/21/17 0230  ceFEPIme (MAXIPIME) 1 g in sodium chloride 0.9 % 100 mL IVPB  Status:  Discontinued     1 g 200 mL/hr over 30 Minutes Intravenous Every 8 hours 11/20/17 1835 11/21/17 1553   11/20/17 1930  azithromycin (ZITHROMAX) 500 mg in sodium chloride 0.9 % 250 mL IVPB  Status:  Discontinued     500 mg 250 mL/hr over 60 Minutes Intravenous Every 24 hours 11/20/17 1926 11/21/17 1553   11/20/17 1715  vancomycin (VANCOCIN) 2,000 mg in sodium chloride 0.9 % 500 mL IVPB     2,000 mg 250 mL/hr over 120 Minutes Intravenous  Once 11/20/17 1703 11/20/17 2055   11/20/17 1700  ceFEPIme (MAXIPIME) 1 g in sodium chloride 0.9 % 100 mL IVPB     1 g 200 mL/hr over 30 Minutes Intravenous  Once 11/20/17 1657 11/20/17 1854        Objective:   Vitals:   11/28/17 2300 11/29/17 0300 11/29/17 0821 11/29/17 1446  BP: (!) 108/55 (!) 111/56 103/67 110/62  Pulse: 72 70 74 75  Resp: 16 16 (!) 28   Temp: 98.9 F (37.2 C) 97.9 F (36.6 C) 97.9 F (36.6 C)   TempSrc: Oral Oral Oral   SpO2: 96% 95% 97%   Weight:      Height:        Wt Readings from Last 3 Encounters:  11/21/17 63 kg (138 lb 12.8 oz)  11/09/17 97.2 kg (214 lb 3.2 oz)  11/04/17 96.4 kg (212 lb 8 oz)     Intake/Output Summary (Last 24 hours) at 11/29/2017 1703 Last data filed at 11/29/2017 0100 Gross per 24 hour  Intake -  Output 1350 ml  Net -1350 ml     Physical Exam  Gen:- Awake Alert,  In no apparent distress  HEENT:- [place].AT, No sclera icterus Ears-very very hard of  hearing  neck-Supple Neck,No JVD,.  Lungs-diminished on the left, few scattered rhonchi  CV- S1, S2 normal Abd-  +ve B.Sounds, Abd Soft, No tenderness,    Extremity/Skin:- No  edema,   good pulses Psych-affect is appropriate, oriented x3 Neuro-no new focal deficits, no tremors   Data Review:   Micro Results Recent  Results (from the past 240 hour(s))  Blood culture (routine x 2)     Status: Abnormal   Collection Time: 11/20/17  4:30 PM  Result Value Ref Range Status   Specimen Description BLOOD RIGHT ANTECUBITAL  Final   Special Requests   Final    BOTTLES DRAWN AEROBIC AND ANAEROBIC Blood Culture adequate volume   Culture  Setup Time   Final    GRAM NEGATIVE RODS IN BOTH AEROBIC AND ANAEROBIC BOTTLES CRITICAL RESULT CALLED TO, READ BACK BY AND VERIFIED WITH: M MACCIA,PHARMD AT 6063 11/21/17 BY L BENFIELD    Culture (A)  Final    HAEMOPHILUS INFLUENZAE BETA LACTAMASE POSITIVE HEALTH DEPARTMENT NOTIFIED Performed at [facility], [place] [address] [place], [place] [address]    Report Status 11/24/2017 FINAL  Final  Blood culture (routine x 2)     Status: Abnormal (Preliminary result)   Collection Time: 11/20/17  4:35 PM  Result Value Ref Range Status   Specimen Description BLOOD RIGHT HAND  Final   Special Requests   Final    BOTTLES DRAWN AEROBIC AND ANAEROBIC Blood Culture adequate volume   Culture  Setup Time   Final    GRAM NEGATIVE RODS IN BOTH AEROBIC AND ANAEROBIC BOTTLES CRITICAL RESULT CALLED TO, READ BACK BY AND VERIFIED WITH: M MACCIA,PHARMD AT 1547 11/21/17 BY L BENFIELD    Culture (A)  Final    HAEMOPHILUS INFLUENZAE BETA LACTAMASE POSITIVE HEALTH DEPARTMENT NOTIFIED Referred to [facility] in [place], [place] for Serotyping. Performed at [facility], [place] [address], [place], [place] [address]    Report Status PENDING  Incomplete  Blood Culture ID Panel (Reflexed)     Status: Abnormal   Collection Time: 11/20/17  4:35 PM    Result Value Ref Range Status   Enterococcus species NOT DETECTED NOT DETECTED Final   Listeria monocytogenes NOT DETECTED NOT DETECTED Final   Staphylococcus species NOT DETECTED NOT DETECTED Final   Staphylococcus aureus NOT DETECTED NOT DETECTED Final   Streptococcus species NOT DETECTED NOT DETECTED Final   Streptococcus agalactiae NOT DETECTED NOT DETECTED Final   Streptococcus pneumoniae NOT DETECTED NOT DETECTED Final   Streptococcus pyogenes NOT DETECTED NOT DETECTED Final   Acinetobacter baumannii NOT DETECTED NOT DETECTED Final   Enterobacteriaceae species NOT DETECTED NOT DETECTED Final   Enterobacter cloacae complex NOT DETECTED NOT DETECTED Final   Escherichia coli NOT DETECTED NOT DETECTED Final   Klebsiella oxytoca NOT DETECTED NOT DETECTED Final   Klebsiella pneumoniae NOT DETECTED NOT DETECTED Final   Proteus species NOT DETECTED NOT DETECTED Final   Serratia marcescens NOT DETECTED NOT DETECTED Final   Haemophilus influenzae DETECTED (A) NOT DETECTED Final    Comment: CRITICAL RESULT CALLED TO, READ BACK BY AND VERIFIED WITH: M MACCIA,PHARMD AT 1547 11/21/17 BY L BENFIELD    Neisseria meningitidis NOT DETECTED NOT DETECTED Final   Pseudomonas aeruginosa NOT DETECTED NOT DETECTED Final   Candida albicans NOT DETECTED NOT DETECTED Final   Candida glabrata NOT DETECTED NOT DETECTED Final   Candida krusei NOT DETECTED NOT DETECTED Final   Candida parapsilosis NOT DETECTED NOT DETECTED Final   Candida tropicalis NOT DETECTED NOT DETECTED Final    Comment: Performed at [facility], [address], [place], [place] [address]  Culture, sputum-assessment     Status: None   Collection Time: 11/20/17  6:32 PM  Result Value Ref Range Status   Specimen Description EXPECTORATED SPUTUM  Final   Special Requests Normal  Final   Sputum evaluation   Final    THIS SPECIMEN IS ACCEPTABLE FOR SPUTUM CULTURE Performed at [facility], [place] [address], [place], [place]  [address]    Report Status 11/20/2017 FINAL  Final  Culture, respiratory (NON-Expectorated)     Status: None   Collection Time: 11/20/17  6:32 PM  Result Value Ref Range Status   Specimen Description EXPECTORATED SPUTUM  Final   Special Requests Normal Reflexed from [id number]  Final   Gram Stain   Final    MODERATE WBC PRESENT, PREDOMINANTLY PMN RARE SQUAMOUS EPITHELIAL CELLS PRESENT ABUNDANT GRAM POSITIVE COCCI IN PAIRS IN CLUSTERS MODERATE GRAM NEGATIVE RODS    Culture   Final    Consistent with normal respiratory flora. Performed at [facility], [place] [address] [place], [place] [address]    Report Status 11/23/2017 FINAL  Final  MRSA PCR Screening     Status: None   Collection Time: 11/20/17  8:28 PM  Result Value Ref Range Status   MRSA by PCR NEGATIVE NEGATIVE Final    Comment:        The GeneXpert MRSA Assay (FDA approved for NASAL specimens only), is one component of a comprehensive MRSA colonization surveillance program. It is not intended to diagnose MRSA infection nor to guide or monitor treatment for MRSA infections. Performed at [facility], [place] [address] [place], [place] [address]   Culture, blood (routine x 2)     Status: None   Collection Time: 11/24/17  1:00 PM  Result Value Ref Range Status   Specimen Description BLOOD LEFT ANTECUBITAL  Final   Special Requests   Final    BOTTLES DRAWN AEROBIC AND ANAEROBIC Blood Culture adequate volume   Culture   Final    NO GROWTH 5 DAYS Performed at [facility], [place] [address] [place], [place] [address]    Report Status 11/29/2017 FINAL  Final  Culture, blood (routine x 2)     Status: Abnormal   Collection Time: 11/24/17  1:09 PM  Result Value Ref Range Status   Specimen Description BLOOD RIGHT ANTECUBITAL  Final   Special Requests   Final    BOTTLES DRAWN AEROBIC ONLY Blood Culture results may not be optimal due to an inadequate volume of blood received in culture bottles   Culture  Setup Time    Final    GRAM POSITIVE COCCI AEROBIC BOTTLE ONLY CRITICAL RESULT CALLED TO, READ BACK BY AND VERIFIED WITH: Ferne Coe PharmD 10:25 11/25/17 ([email or web address])    Culture (A)  Final    STAPHYLOCOCCUS SPECIES (COAGULASE NEGATIVE) THE SIGNIFICANCE OF ISOLATING THIS ORGANISM FROM A SINGLE SET OF BLOOD CULTURES WHEN MULTIPLE SETS ARE DRAWN IS UNCERTAIN. PLEASE NOTIFY THE MICROBIOLOGY DEPARTMENT WITHIN ONE WEEK IF SPECIATION AND SENSITIVITIES ARE REQUIRED. Performed at [facility], [place] [address], [place], [place] [address]    Report Status 11/27/2017 FINAL  Final  Blood Culture ID Panel (Reflexed)     Status: Abnormal   Collection Time: 11/24/17  1:09 PM  Result Value Ref Range Status   Enterococcus species NOT DETECTED NOT DETECTED Final   Listeria monocytogenes NOT DETECTED NOT DETECTED Final   Staphylococcus species DETECTED (A) NOT DETECTED Final    Comment: Methicillin (oxacillin) resistant coagulase negative staphylococcus. Possible blood culture contaminant (unless isolated from more than one blood culture draw or clinical case suggests pathogenicity). No antibiotic treatment is indicated  for blood  culture contaminants. CRITICAL RESULT CALLED TO, READ BACK BY AND VERIFIED WITH: Ferne Coe PharmD 10:25 11/25/17 ([email or web address])    Staphylococcus aureus NOT DETECTED NOT DETECTED Final   Methicillin resistance DETECTED (A) NOT DETECTED Final    Comment: CRITICAL RESULT CALLED TO, READ BACK BY AND VERIFIED WITH: Ferne Coe PharmD 10:25 11/25/17 ([email or web address])    Streptococcus species NOT DETECTED NOT DETECTED Final   Streptococcus agalactiae NOT DETECTED NOT DETECTED Final   Streptococcus pneumoniae NOT DETECTED NOT DETECTED Final   Streptococcus pyogenes NOT DETECTED NOT DETECTED Final   Acinetobacter baumannii NOT DETECTED NOT DETECTED Final   Enterobacteriaceae species NOT DETECTED NOT DETECTED Final   Enterobacter cloacae complex NOT DETECTED NOT DETECTED Final   Escherichia coli NOT DETECTED NOT  DETECTED Final   Klebsiella oxytoca NOT DETECTED NOT DETECTED Final   Klebsiella pneumoniae NOT DETECTED NOT DETECTED Final   Proteus species NOT DETECTED NOT DETECTED Final   Serratia marcescens NOT DETECTED NOT DETECTED Final   Haemophilus influenzae NOT DETECTED NOT DETECTED Final   Neisseria meningitidis NOT DETECTED NOT DETECTED Final   Pseudomonas aeruginosa NOT DETECTED NOT DETECTED Final   Candida albicans NOT DETECTED NOT DETECTED Final   Candida glabrata NOT DETECTED NOT DETECTED Final   Candida krusei NOT DETECTED NOT DETECTED Final   Candida parapsilosis NOT DETECTED NOT DETECTED Final   Candida tropicalis NOT DETECTED NOT DETECTED Final    Comment: Performed at [facility], [address] [place], [place] [address]  Gram stain     Status: None   Collection Time: 11/25/17  3:43 PM  Result Value Ref Range Status   Specimen Description FLUID PLEURAL  Final   Special Requests NONE  Final   Gram Stain   Final    ABUNDANT WBC PRESENT,BOTH PMN AND MONONUCLEAR NO ORGANISMS SEEN Performed at [facility], [address], [place], [place] [address]    Report Status 11/25/2017 FINAL  Final  Body fluid culture     Status: None   Collection Time: 11/26/17  1:01 PM  Result Value Ref Range Status   Specimen Description PLEURAL FLUID LEFT  Final   Special Requests NONE  Final   Gram Stain   Final    MODERATE WBC PRESENT,BOTH PMN AND MONONUCLEAR NO ORGANISMS SEEN    Culture   Final    NO GROWTH 3 DAYS Performed at [facility], [place] [address] [place], [place] [address]    Report Status 11/29/2017 FINAL  Final  Surgical pcr screen     Status: None   Collection Time: 11/27/17  2:54 PM  Result Value Ref Range Status   MRSA, PCR NEGATIVE NEGATIVE Final   Staphylococcus aureus NEGATIVE NEGATIVE Final    Comment: (NOTE) The Xpert SA Assay (FDA approved for NASAL specimens in patients 79 years of age and older), is one component of a comprehensive surveillance  program. It is not intended to diagnose infection nor to guide or monitor treatment. Performed at [facility], [place] [address] [place], [place] [address]     Radiology Reports Dg Chest 1 View  Result Date: 11/26/2017 CLINICAL DATA:  Status post thoracentesis. EXAM: CHEST  1 VIEW COMPARISON:  Yesterday. FINDINGS: Significant decrease in amount of left pleural fluid following thoracentesis. No pneumothorax. Stable enlarged cardiac silhouette. Dense airspace opacity at the left lung base, increased. Clear right lung with an overlying skin fold. Bilateral shoulder degenerative changes. IMPRESSION: 1. No pneumothorax following  left thoracentesis. 2. Dense left lower lobe atelectasis or pneumonia, significantly increased. 3. Stable cardiomegaly. Electronically Signed   By: Claudie Revering M.D.   On: 11/26/2017 13:06   Dg Chest 1 View  Result Date: 11/23/2017 CLINICAL DATA:  Hypoxia.  Atrial fibrillation. EXAM: CHEST  1 VIEW COMPARISON:  November 22, 2017 FINDINGS: There is opacity throughout much of the left lung, in part due to pleural effusion. There is also patchy airspace consolidation throughout much of the left lung. There is slight atelectatic change in the right base. The right lung is otherwise clear. Heart is mildly enlarged with pulmonary vascularity within normal limits. Pacemaker lead is attached to the right ventricle. No adenopathy. There is aortic atherosclerosis. There is degenerative change in each shoulder. IMPRESSION: Opacity throughout much of the left lung likely due to a combination of pleural effusion and airspace consolidation, essentially stable. Slight right base atelectasis. Right lung otherwise clear. Stable cardiomegaly. Stable pacemaker placement. There is aortic atherosclerosis. Aortic Atherosclerosis (ICD10-I70.0). Electronically Signed   By: Lowella Grip III M.D.   On: 11/23/2017 08:17   Dg Chest 1 View  Result Date: 11/22/2017 CLINICAL DATA:  Progressive cough,  congestion, and shortness of breath. Fall. EXAM: CHEST  1 VIEW COMPARISON:  Two-view chest x-ray 11/20/2017 FINDINGS: The heart size is exaggerated by low lung volumes. Increased asymmetric left-sided interstitial and airspace disease is noted. A left pleural effusion is present. Right-sided interstitial disease is increasing. IMPRESSION: 1. Increasing interstitial and airspace disease in the left lung consistent with edema and infection. 2. Increasing left pleural effusion. 3. Progressive edema in the right lung. Electronically Signed   By: San Morelle M.D.   On: 11/22/2017 07:34   Dg Chest 2 View  Result Date: 11/20/2017 CLINICAL DATA:  84 year old with chronic cough, presenting with acute worsening, associated with congestion, shortness of breath and fever. EXAM: CHEST - 2 VIEW COMPARISON:  07/21/2017, 07/19/2017, 01/08/2015 and earlier. FINDINGS: AP ERECT and LATERAL images were obtained. LEFT subclavian single lead transvenous pacemaker with the lead tip at the expected location of the RV apex, unchanged. Cardiac silhouette moderately enlarged, unchanged since earlier this year but increased in size since 2016. Thoracic aorta mildly atherosclerotic, unchanged. Hilar and mediastinal contours otherwise unremarkable. Airspace consolidation involving the LEFT lower lobe associated with a LEFT pleural effusion. Pulmonary venous hypertension without overt edema. Degenerative changes involving the thoracic and UPPER lumbar spine. IMPRESSION: 1. Acute LEFT lower lobe pneumonia with an associated LEFT pleural effusion. 2. Stable moderate cardiomegaly. Pulmonary venous hypertension without overt edema. Electronically Signed   By: Evangeline Dakin M.D.   On: 11/20/2017 14:10   Ct Head Wo Contrast  Result Date: 11/20/2017 CLINICAL DATA:  Tripped and fell 3 days ago, hitting the head. Bruising to the right temple. On anticoagulation. Initial encounter. EXAM: CT HEAD WITHOUT CONTRAST TECHNIQUE: Contiguous  axial images were obtained from the base of the skull through the vertex without intravenous contrast. COMPARISON:  07/20/2017 FINDINGS: Brain: There is no evidence of acute infarct, intracranial hemorrhage, mass, midline shift, or extra-axial fluid collection. Mild cerebral atrophy is unchanged. Periventricular white matter hypodensities are also unchanged and nonspecific but compatible with mild chronic small vessel ischemic disease. Vascular: Calcified atherosclerosis at the skull base. No hyperdense vessel. Skull: No fracture or focal osseous lesion. Sinuses/Orbits: Improved paranasal sinus aeration. Moderate residual right ethmoid and right maxillary sinus mucosal thickening. Large right mastoid effusion, mildly increased from prior. Resolved right middle ear opacification. At most trace fluid at the  left mastoid tip. Other: Small right frontotemporal scalp hematoma. IMPRESSION: 1. No evidence of acute intracranial abnormality. 2. Small right frontotemporal scalp hematoma. 3. Mild chronic small vessel ischemic disease. 4. Persistent large right mastoid effusion. Resolved right middle ear effusion. 5. Improved paranasal sinus aeration. Electronically Signed   By: Logan Bores M.D.   On: 11/20/2017 15:14   Ct Chest Wo Contrast  Result Date: 11/23/2017 CLINICAL DATA:  Left pleural effusion. EXAM: CT CHEST WITHOUT CONTRAST TECHNIQUE: Multidetector CT imaging of the chest was performed following the standard protocol without IV contrast. COMPARISON:  Chest x-ray from same day. CT chest dated April 17, 2014. FINDINGS: Cardiovascular: Mild cardiomegaly with unchanged left chest wall pacemaker. No pericardial effusion. Normal caliber thoracic aorta. Coronary, aortic arch, and branch vessel atherosclerotic vascular disease. Mediastinum/Nodes: Multiple prominent bilateral axillary, left supraclavicular, and mediastinal lymph nodes measuring up to 10 mm in short axis are overall similar to prior study. The thyroid  gland trachea, and esophagus demonstrate no significant findings. Lungs/Pleura: Moderate to large left pleural effusion. Trace right pleural effusion. Complete collapse of the left lower lobe. Areas of mucous impaction in the right lower lobe with subsegmental atelectasis. Mild diffuse peribronchial thickening. No consolidation or pneumothorax. No suspicious pulmonary nodule. Mild mosaic attenuation throughout the lungs. Upper Abdomen: No acute abnormality. Musculoskeletal: No chest wall abnormality. No acute or significant osseous findings. Degenerative changes of the thoracic spine. IMPRESSION: 1. Moderate to large left pleural effusion with complete collapse of the left lower lobe. 2. Trace right pleural effusion with right lower lobe subsegmental atelectasis. 3. Mild mosaic attenuation throughout the lungs could reflect chronic small airways or small vessel disease. 4. Prominent bilateral axillary, left supraclavicular, and mediastinal lymph nodes measuring up to 10 mm in short axis are overall similar to prior study and likely reflect patient's history of chronic lymphocytic leukemia. 5.  Aortic atherosclerosis (ICD10-I70.0). Electronically Signed   By: Titus Dubin M.D.   On: 11/23/2017 18:00   Dg Chest Port 1 View  Result Date: 11/29/2017 CLINICAL DATA:  Shortness of Breath EXAM: PORTABLE CHEST 1 VIEW COMPARISON:  11/27/2017 FINDINGS: Cardiac shadow is stable. Pacing device is again seen. Right lung is clear. Small left pleural effusion is noted. There is a rounded area of density noted laterally likely related to a loculated component. No bony abnormality is seen. IMPRESSION: Stable appearing left pleural effusion with some degree of loculation. Electronically Signed   By: Inez Catalina M.D.   On: 11/29/2017 08:59   Dg Chest Port 1 View  Result Date: 11/27/2017 CLINICAL DATA:  Pneumonia EXAM: PORTABLE CHEST 1 VIEW COMPARISON:  November 26, 2017 FINDINGS: There is layering pleural effusion on the left.  There is consolidation in the left base. There is mild right base atelectasis. Right lung otherwise is clear. Heart is mildly enlarged with pulmonary vascularity normal. Pacemaker lead attached to right ventricle. No pneumothorax. No adenopathy. No bone lesions. IMPRESSION: Layering pleural effusion on the left with persistent consolidation left base. Mild right base atelectasis. Stable cardiac prominence. Electronically Signed   By: Lowella Grip III M.D.   On: 11/27/2017 09:27   Dg Chest Port 1 View  Result Date: 11/25/2017 CLINICAL DATA:  84 year old male with shortness of breath and pleural effusion. EXAM: PORTABLE CHEST 1 VIEW COMPARISON:  11/23/2017 CT, chest radiograph and prior studies FINDINGS: Cardiomediastinal silhouette is unchanged. A LEFT pacemaker again noted. LEFT pleural effusion and LEFT LOWER lung consolidation/atelectasis again noted. The RIGHT lung is clear. No pneumothorax or  acute bony abnormality. IMPRESSION: Little significant change in appearance of the chest with continued LEFT pleural effusion and LEFT LOWER lung atelectasis/consolidation. Electronically Signed   By: Margarette Canada M.D.   On: 11/25/2017 16:53   Dg Swallowing Func-speech Pathology  Result Date: 11/23/2017 Objective Swallowing Evaluation: Type of Study: MBS-Modified Barium Swallow Study  Patient Details Name: DIONTRE HARPS MRN: [id number] Date of Birth: 12/17/1932 Today's Date: 11/23/2017 Time: SLP Start Time (ACUTE ONLY): 0160 -SLP Stop Time (ACUTE ONLY): 1637 SLP Time Calculation (min) (ACUTE ONLY): 53 min Past Medical History: Past Medical History: Diagnosis Date . Asthma 1950's  history of . Atrial fibrillation ([place])  . Bilateral carpal tunnel syndrome  . Bilateral lower extremity edema  . Bladder tumor  . Chronic systolic heart failure (HCC)   Echo 1/19: Mild LVH, EF 45-50, inf HK, MAC, severe LAE, severe RAE // Echo 7/15: Mild LVH, mod focal basal sept hypertrophy, EF 55-60, AV peak and mean 16/9, trivial MR, mod  LAE, PASP 38 . CLL (chronic lymphocytic leukemia) [facility]) oncologist-  dr Ilene Qua--  dx [phone] ;  Lymphocytosis, CLL - per lov note 05-11-2017 currently under active survillance,  CT 04-17-2014 show very small lymphadenopathy, no indication for treatment . Coronary artery disease   cardiologist-  dr Cathie Olden--  08-18-2017 Intermittant risk nuclear study w/ large area of inferior infartion with no evidence ishcemia  . Deafness in right ear  . Diabetes mellitus type 2, noninsulin dependent ([place])  . Elevated PSA   since prostatectomy but now resolved . Hematuria 04/2017 . History of ear infection   Right . History of MI (myocardial infarction)   per myoview nuclear study 08-18-2017 , unknown when . History of shingles 08/2017  L ear and scalp, possible . Hyperlipidemia  . Hypertension  . Ischemic cardiomyopathy 09/01/2017  Presumed +CAD with Nuclear stress test 08/18/17 - Inferior scar, no ischemia, intermediate risk // med management unless +angina or worse dyspnea . OA (osteoarthritis)  . Pacemaker 02/08/2014  followed by dr g. taylor--  single chamber Biotronik due to SSS . Permanent atrial fibrillation ([place])  . Pneumonia 2019  Left lung . Prostate cancer [facility]) urologist-  dr Diona Fanti  dx 2004--  Gleason 8, PSA 10.45--  11-28-2002  s/p  radical prostatectomy;  recurrent w/ increasing PSA, started ADT treatment . RBBB (right bundle branch block)  . Sick sinus syndrome ([place])   a-Flutter with episodes of bradycardia; S/P Biotronik (serial number [id number]) 02-08-2014 . Urinary incontinence  . Wears hearing aid in right ear   receiver and transmitter Past Surgical History: Past Surgical History: Procedure Laterality Date . APPENDECTOMY   . BACK SURGERY    disk . CARDIAC CATHETERIZATION  09-03-1999  dr Cathie Olden  abnormal cardiolite study:  minor luminal irregularities but no critial coronary artery stenosis . CARDIOVASCULAR STRESS TEST  08-18-2017  dr Cathie Olden  Intermediate risk nuclear study w/ large area inferior infarction, no  evidence of ishcemia (consistant w/ prior MI)/  study not gated due to frequent PVCs . CARPAL TUNNEL RELEASE Right 2000 . CARPAL TUNNEL RELEASE Left 11/19/2009 . CATARACT EXTRACTION Right 07/2015 . CATARACT EXTRACTION Left 09/2015 . CYSTOSCOPY N/A 11/04/2017  Procedure: CYSTOSCOPY AND CAUTERIZATION OF BLADDER;  Surgeon: Franchot Gallo, MD;  Location: [facility];  Service: Urology;  Laterality: N/A; . INSERTION PENILE PROSTHESIS  02-22-2004    dr Mattie Marlin  [facility] . KNEE ARTHROSCOPY Left 07/2010 . PERMANENT PACEMAKER INSERTION N/A 02/08/2014  Procedure: PERMANENT PACEMAKER INSERTION;  Surgeon: Evans Lance,  MD;  Location: [facility] CATH [facility];  Service: Cardiovascular;  Laterality: N/A; . RADICAL RETROPUBIC PROSTATECTOMY W/ BILATERAL PELVIC LYMPH NODE DISSECTION  11-28-2002   dr Mattie Marlin  [facility] . TONSILLECTOMY   . TOTAL HIP ARTHROPLASTY Left 05/12/2016  Procedure: LEFT TOTAL HIP ARTHROPLASTY ANTERIOR APPROACH;  Surgeon: Paralee Cancel, MD;  Location: [facility] ORS;  Service: Orthopedics;  Laterality: Left; . TOTAL HIP ARTHROPLASTY Right 07-15-2006   dr Alvan Dame  [facility] . TRANSTHORACIC ECHOCARDIOGRAM  07/20/2017  mild LVH, ef 45-50%, hypokinesis of the basal-midinferior myocardium, due to AFib unable to evaluate diastolic function/  severe LAE and RAE/  trivial PR and TR HPI: Pt is an 84 year-old male with a history of CLL, prostate cancer, sick sinus syndrome, pacemaker, paroxysmal atrial fibrillation, CHF with ejection fraction 45% admitted with LLL PNA. Per consult note from critical care, pt/wife describe cyclic cough for over a year, with coughing sometimes escalating during meals, especially with dry foods.  Subjective: alert, denies swallowing difficulty Assessment / Plan / Recommendation CHL IP CLINICAL IMPRESSIONS 11/23/2017 Clinical Impression Pt has a mild oropharyngeal dysphagia with structural component due to suspected osteophytes most prominent at C3-4, C4-5 that impede full epiglottic deflection and airway  closure. What further impacts his ability to protect his airway is his impulsive intake (large boluses and fast rate) and premature spillage. Thin and nectar thick liquids spill into the pharynx, filling the valleculae and spilling onward to the pyriform sinuses before the swallow. Then, as he swallows, the thin liquids spills into the partially open airway, allowing for trace amounts of penetration and aspiration. Pt had significant baseline coughing that made it very challenging to determine whether coughing was related to aspiration or not. SLP provided Mod cues for attempts at a chin tuck, oral holding, and smaller bolus sizes. Small sips were the most effective at protecting the airway with thin liquids although I suspect if he could have performed the oral hold that this would further increase his safety with swallowing. Unfortunately, pt could not consistently utilize these strategies. No airway compromise occurred with thicker liquids or solids, although pt did have to take intermittent pauses during oral prep with solids to catch his breath. Although pt's dysphagia is mild, considering his impulsivity as well as his recurrent PNA and chronic cough, would favor starting a more conservative diet at this time: Dys 3 diet and nectar thick liquids. SLP will f/u to facilitate hopeful transition back to thin liquids with improved ability to take small sips, contain liquids orally, and increase oral care/use of aspiration precautions.  SLP Visit Diagnosis Dysphagia, oropharyngeal phase (R13.12) Attention and concentration deficit following -- Frontal lobe and executive function deficit following -- Impact on safety and function Mild aspiration risk   CHL IP TREATMENT RECOMMENDATION 11/23/2017 Treatment Recommendations Therapy as outlined in treatment plan below   Prognosis 11/23/2017 Prognosis for Safe Diet Advancement Good Barriers to Reach Goals -- Barriers/Prognosis Comment -- CHL IP DIET RECOMMENDATION 11/23/2017 SLP  Diet Recommendations Dysphagia 3 (Mech soft) solids;Nectar thick liquid Liquid Administration via Cup Medication Administration Whole meds with puree Compensations Slow rate;Small sips/bites Postural Changes Seated upright at 90 degrees   CHL IP OTHER RECOMMENDATIONS 11/23/2017 Recommended Consults -- Oral Care Recommendations Oral care BID Other Recommendations Order thickener from pharmacy;Prohibited food (jello, ice cream, thin soups);Remove water pitcher   CHL IP FOLLOW UP RECOMMENDATIONS 11/23/2017 Follow up Recommendations Outpatient SLP;Home health SLP   CHL IP FREQUENCY AND DURATION 11/23/2017 Speech Therapy Frequency (ACUTE ONLY) min 2x/week Treatment Duration  2 weeks      CHL IP ORAL PHASE 11/23/2017 Oral Phase Impaired Oral - Pudding Teaspoon -- Oral - Pudding Cup -- Oral - Honey Teaspoon -- Oral - Honey Cup -- Oral - Nectar Teaspoon -- Oral - Nectar Cup Decreased bolus cohesion;Premature spillage Oral - Nectar Straw -- Oral - Thin Teaspoon -- Oral - Thin Cup Decreased bolus cohesion;Premature spillage Oral - Thin Straw Decreased bolus cohesion;Premature spillage Oral - Puree Piecemeal swallowing Oral - Mech Soft Delayed oral transit Oral - Regular -- Oral - Multi-Consistency -- Oral - Pill -- Oral Phase - Comment --  CHL IP PHARYNGEAL PHASE 11/23/2017 Pharyngeal Phase Impaired Pharyngeal- Pudding Teaspoon -- Pharyngeal -- Pharyngeal- Pudding Cup -- Pharyngeal -- Pharyngeal- Honey Teaspoon -- Pharyngeal -- Pharyngeal- Honey Cup -- Pharyngeal -- Pharyngeal- Nectar Teaspoon -- Pharyngeal -- Pharyngeal- Nectar Cup Reduced epiglottic inversion;Reduced airway/laryngeal closure;Delayed swallow initiation-pyriform sinuses Pharyngeal -- Pharyngeal- Nectar Straw -- Pharyngeal -- Pharyngeal- Thin Teaspoon -- Pharyngeal -- Pharyngeal- Thin Cup Reduced epiglottic inversion;Reduced airway/laryngeal closure;Delayed swallow initiation-pyriform sinuses;Penetration/Aspiration during swallow Pharyngeal Material enters airway,  CONTACTS cords and not ejected out Pharyngeal- Thin Straw Reduced epiglottic inversion;Reduced airway/laryngeal closure;Delayed swallow initiation-pyriform sinuses;Penetration/Aspiration during swallow Pharyngeal Material enters airway, passes BELOW cords without attempt by patient to eject out (silent aspiration) Pharyngeal- Puree Reduced epiglottic inversion;Reduced airway/laryngeal closure Pharyngeal -- Pharyngeal- Mechanical Soft Reduced epiglottic inversion;Reduced airway/laryngeal closure Pharyngeal -- Pharyngeal- Regular -- Pharyngeal -- Pharyngeal- Multi-consistency -- Pharyngeal -- Pharyngeal- Pill -- Pharyngeal -- Pharyngeal Comment --  CHL IP CERVICAL ESOPHAGEAL PHASE 11/23/2017 Cervical Esophageal Phase WFL Pudding Teaspoon -- Pudding Cup -- Honey Teaspoon -- Honey Cup -- Nectar Teaspoon -- Nectar Cup -- Nectar Straw -- Thin Teaspoon -- Thin Cup -- Thin Straw -- Puree -- Mechanical Soft -- Regular -- Multi-consistency -- Pill -- Cervical Esophageal Comment -- No flowsheet data found. [name] 11/23/2017, 5:04 PM  [name], M.A. CCC-SLP [phone]             Ir Thoracentesis Asp Pleural Space W/img Guide  Result Date: 11/26/2017 INDICATION: Patient with left pleural effusion. Request is made for diagnostic and therapeutic thoracentesis. EXAM: ULTRASOUND GUIDED DIAGNOSTIC AND THERAPEUTIC LEFT THORACENTESIS MEDICATIONS: 10 mL 2% lidocaine COMPLICATIONS: None immediate. PROCEDURE: An ultrasound guided thoracentesis was thoroughly discussed with the patient and questions answered. The benefits, risks, alternatives and complications were also discussed. The patient understands and wishes to proceed with the procedure. Written consent was obtained. Ultrasound was performed to localize and mark an adequate pocket of fluid in the left chest. The area was then prepped and draped in the normal sterile fashion. 2% lidocaine was used for local anesthesia. Under ultrasound guidance a  Safe-T-Centesis catheter was introduced. Thoracentesis was performed. The catheter was removed and a dressing applied. FINDINGS: A total of approximately 70 mL of cloudy yellow fluid was removed. Samples were sent to the laboratory as requested by the clinical team. IMPRESSION: Successful ultrasound guided diagnostic and therapeutic left thoracentesis yielding 70 mL of pleural fluid. Read by: Brynda Greathouse PA-C Electronically Signed   By: Markus Daft M.D.   On: 11/26/2017 14:15     CBC Recent Labs  Lab 11/25/17 0532 11/26/17 0403 11/27/17 0240 11/28/17 0257 11/29/17 0211  WBC 43.6* 37.3* 37.7* 47.4* 51.3*  HGB 9.4* 9.8* 10.2* 10.3* 10.3*  HCT 27.4* 28.9* 30.2* 31.1* 31.5*  PLT 507* 550* 631* 671* 715*  MCV 94.8 96.0 95.9 96.9 98.1  MCH 32.5 32.6 32.4 32.1 32.1  MCHC 34.3 33.9 33.8 33.1 32.7  RDW 18.2* 18.5* 18.6* 18.7* 18.9*  LYMPHSABS 22.7* 21.3* 26.8* 31.8* 34.4*  MONOABS 1.3* 2.6* 1.1* 1.9* 1.0  EOSABS 0.0 0.0 0.0 0.0 0.0  BASOSABS 0.0 0.0 0.0 0.0 0.0    Chemistries  Recent Labs  Lab 11/26/17 0403 11/27/17 0240 11/28/17 0257 11/28/17 1301 11/29/17 0211  NA 136 137 138 135 138  K 4.4 4.4 4.5 5.3* 4.7  CL 100* 96* 100* 97* 99*  CO2 29 31 29 28 29   GLUCOSE 238* 180* 155* 320* 102*  BUN 32* 31* 28* 30* 30*  CREATININE 0.91 0.95 1.01 1.01 0.98  CALCIUM 8.7* 8.9 8.9 9.1 9.1  AST  --   --   --  22  --   ALT  --   --   --  20  --   ALKPHOS  --   --   --  77  --   BILITOT  --   --   --  1.0  --    ------------------------------------------------------------------------------------------------------------------ No results for input(s): CHOL, HDL, LDLCALC, TRIG, CHOLHDL, LDLDIRECT in the last 72 hours.  Lab Results  Component Value Date   HGBA1C 6.0 (H) 05/05/2016   ------------------------------------------------------------------------------------------------------------------ No results for input(s): TSH, T4TOTAL, T3FREE, THYROIDAB in the last 72 hours.  Invalid  input(s): FREET3 ------------------------------------------------------------------------------------------------------------------ No results for input(s): VITAMINB12, FOLATE, FERRITIN, TIBC, IRON, RETICCTPCT in the last 72 hours.  Coagulation profile Recent Labs  Lab 11/25/17 0532 11/26/17 0403 11/27/17 0240 11/28/17 0257 11/29/17 0211  INR 2.07 1.44 1.44 1.38 1.33    No results for input(s): DDIMER in the last 72 hours.  Cardiac Enzymes No results for input(s): CKMB, TROPONINI, MYOGLOBIN in the last 168 hours.  Invalid input(s): CK ------------------------------------------------------------------------------------------------------------------    Component Value Date/Time   BNP 717.8 (H) 11/20/2017 1339     Roxan Hockey M.D on 11/29/2017 at 5:03 PM  Between 7am to 7pm - Pager - [phone]  After 7pm go to [email or web address] - password [email or web address]  [facility] -  Office  [phone]   Voice Recognition Viviann Spare dictation system was used to create this note, attempts have been made to correct errors. Please contact the author with questions and/or clarifications.

## 2017-11-29 NOTE — Progress Notes (Signed)
Dr. Roxan Hockey paged and spoke with regarding hypotension. Orders received, will continue to monitor. Eleonore Chiquito RN 2 Heart CVICU

## 2017-11-29 NOTE — Anesthesia Procedure Notes (Signed)
Arterial Line Insertion Start/End6/03/2018 3:30 PM Performed by: Candis Shine, CRNA, CRNA  Patient location: Pre-op. Preanesthetic checklist: patient identified, IV checked, site marked, risks and benefits discussed, surgical consent, monitors and equipment checked, pre-op evaluation, timeout performed and anesthesia consent Lidocaine 1% used for infiltration Right, radial was placed Catheter size: 20 G Hand hygiene performed  and maximum sterile barriers used   Attempts: 1 Procedure performed without using ultrasound guided technique. Following insertion, dressing applied and Biopatch. Post procedure assessment: normal and unchanged  Patient tolerated the procedure well with no immediate complications.

## 2017-11-29 NOTE — Anesthesia Postprocedure Evaluation (Signed)
Anesthesia Post Note  Patient: Oscar Baker  Procedure(s) Performed: VIDEO ASSISTED THORACOSCOPY (VATS)/EMPYEMA (Left Chest)     Patient location during evaluation: PACU Anesthesia Type: General Level of consciousness: awake and alert Pain management: pain level controlled Vital Signs Assessment: post-procedure vital signs reviewed and stable Respiratory status: spontaneous breathing, nonlabored ventilation, respiratory function stable and patient connected to nasal cannula oxygen Cardiovascular status: blood pressure returned to baseline and stable Postop Assessment: no apparent nausea or vomiting Anesthetic complications: no    Last Vitals:  Vitals:   11/29/17 2115 11/29/17 2200  BP: (!) 93/54   Pulse: (!) 59 (!) 101  Resp: 19 20  Temp:  36.4 C  SpO2: 94% (!) 84%    Last Pain:  Vitals:   11/29/17 2200  TempSrc: Oral  PainSc:                  Ryan P Ellender

## 2017-11-29 NOTE — Progress Notes (Signed)
SLP Cancellation Note  Patient Details Name: Oscar Baker MRN: [id number] DOB: 18-Sep-1932   Cancelled treatment:       Reason Eval/Treat Not Completed: Medical issues which prohibited therapy. Per chart review pt is NPO pending procedure today. Will f/u as able.   [name] 11/29/2017, 8:21 AM  [name], M.A. CCC-SLP [phone]

## 2017-11-29 NOTE — Anesthesia Procedure Notes (Signed)
Procedure Name: Intubation Date/Time: 11/29/2017 5:07 PM Performed by: Oletta Lamas, CRNA Pre-anesthesia Checklist: Patient identified, Emergency Drugs available, Suction available and Patient being monitored Patient Re-evaluated:Patient Re-evaluated prior to induction Oxygen Delivery Method: Circle System Utilized Preoxygenation: Pre-oxygenation with 100% oxygen Induction Type: IV induction Ventilation: Mask ventilation without difficulty Laryngoscope Size: Mac and 4 Grade View: Grade I Tube type: Oral Endobronchial tube: Double lumen EBT, Left and EBT position confirmed by fiberoptic bronchoscope and 39 Fr Number of attempts: 1 Airway Equipment and Method: Stylet Placement Confirmation: ETT inserted through vocal cords under direct vision,  positive ETCO2 and breath sounds checked- equal and bilateral Secured at: 30 cm Tube secured with: Tape Dental Injury: Teeth and Oropharynx as per pre-operative assessment  Comments: Confirmed placement with vivasight

## 2017-11-29 NOTE — Transfer of Care (Signed)
Immediate Anesthesia Transfer of Care Note  Patient: Oscar Baker  Procedure(s) Performed: VIDEO ASSISTED THORACOSCOPY (VATS)/EMPYEMA (Left Chest)  Patient Location: PACU  Anesthesia Type:General  Level of Consciousness: awake, drowsy and patient cooperative  Airway & Oxygen Therapy: Patient Spontanous Breathing and Patient connected to face mask oxygen  Post-op Assessment: Report given to RN and Post -op Vital signs reviewed and stable  Post vital signs: Reviewed and stable  Last Vitals:  Vitals Value Taken Time  BP 114/76 11/29/2017  6:58 PM  Temp    Pulse    Resp 22 11/29/2017  7:01 PM  SpO2    Vitals shown include unvalidated device data.  Last Pain:  Vitals:   11/29/17 0821  TempSrc: Oral  PainSc: 0-No pain      Patients Stated Pain Goal: 0 (76/73/41 [id number])  Complications: No apparent anesthesia complications

## 2017-11-29 NOTE — Anesthesia Preprocedure Evaluation (Addendum)
Anesthesia Evaluation  Patient identified by MRN, date of birth, ID band Patient awake    Reviewed: Allergy & Precautions, NPO status , Patient's Chart, lab work & pertinent test results  Airway Mallampati: II  TM Distance: >3 FB Neck ROM: Full    Dental   Pulmonary asthma , pneumonia, former smoker,    breath sounds clear to auscultation       Cardiovascular hypertension, Pt. on medications and Pt. on home beta blockers + CAD and +CHF  + dysrhythmias Atrial Fibrillation + pacemaker  Rhythm:Regular Rate:Normal  EF 45-50%   Neuro/Psych negative neurological ROS     GI/Hepatic negative GI ROS, Neg liver ROS,   Endo/Other  diabetes  Renal/GU negative Renal ROS     Musculoskeletal   Abdominal   Peds  Hematology  (+) anemia , CLL   Anesthesia Other Findings   Reproductive/Obstetrics                            Lab Results  Component Value Date   WBC 51.3 (HH) 11/29/2017   HGB 10.3 (L) 11/29/2017   HCT 31.5 (L) 11/29/2017   MCV 98.1 11/29/2017   PLT 715 (H) 11/29/2017   Lab Results  Component Value Date   CREATININE 0.98 11/29/2017   BUN 30 (H) 11/29/2017   NA 138 11/29/2017   K 4.7 11/29/2017   CL 99 (L) 11/29/2017   CO2 29 11/29/2017   Lab Results  Component Value Date   INR 1.33 11/29/2017   INR 1.38 11/28/2017   INR 1.44 11/27/2017    Anesthesia Physical  Anesthesia Plan  ASA: IV  Anesthesia Plan: General   Post-op Pain Management:    Induction: Intravenous  PONV Risk Score and Plan: 2 and Treatment may vary due to age or medical condition  Airway Management Planned: Double Lumen EBT  Additional Equipment: Arterial line  Intra-op Plan:   Post-operative Plan: Extubation in OR and Possible Post-op intubation/ventilation  Informed Consent: I have reviewed the patients History and Physical, chart, labs and discussed the procedure including the risks, benefits and  alternatives for the proposed anesthesia with the patient or authorized representative who has indicated his/her understanding and acceptance.   Dental advisory given  Plan Discussed with: CRNA and Anesthesiologist  Anesthesia Plan Comments:        Anesthesia Quick Evaluation

## 2017-11-30 ENCOUNTER — Inpatient Hospital Stay (HOSPITAL_COMMUNITY): Payer: Medicare Other

## 2017-11-30 ENCOUNTER — Inpatient Hospital Stay (HOSPITAL_COMMUNITY): Payer: Medicare Other | Admitting: Certified Registered"

## 2017-11-30 ENCOUNTER — Encounter (HOSPITAL_COMMUNITY): Payer: Self-pay | Admitting: Cardiothoracic Surgery

## 2017-11-30 ENCOUNTER — Encounter (HOSPITAL_COMMUNITY)
Admission: EM | Disposition: A | Discharge status: Home Health | Payer: Self-pay | Source: Home / Self Care | Attending: Cardiothoracic Surgery

## 2017-11-30 DIAGNOSIS — I97638 Postprocedural hematoma of a circulatory system organ or structure following other circulatory system procedure: Secondary | ICD-10-CM

## 2017-11-30 HISTORY — PX: PLEURAL EFFUSION DRAINAGE: SHX5099

## 2017-11-30 HISTORY — PX: VIDEO ASSISTED THORACOSCOPY: SHX5073

## 2017-11-30 LAB — POCT I-STAT 3, ART BLOOD GAS (G3+)
Acid-base deficit: 3 mmol/L — ABNORMAL HIGH (ref 0.0–2.0)
Acid-base deficit: 1 mmol/L (ref 0.0–2.0)
Bicarbonate: 21.5 mmol/L (ref 20.0–28.0)
Bicarbonate: 23.8 mmol/L (ref 20.0–28.0)
O2 SAT: 99 %
O2 SAT: 95 %
PCO2 ART: 39.7 mmHg (ref 32.0–48.0)
PO2 ART: 119 mmHg — AB (ref 83.0–108.0)
Patient temperature: 97
TCO2: 23 mmol/L (ref 22–32)
TCO2: 25 mmol/L (ref 22–32)
pCO2 arterial: 34.4 mmHg (ref 32.0–48.0)
pH, Arterial: 7.4 (ref 7.350–7.450)
pH, Arterial: 7.384 (ref 7.350–7.450)
pO2, Arterial: 73 mmHg — ABNORMAL LOW (ref 83.0–108.0)

## 2017-11-30 LAB — CBC
HCT: 22.7 % — ABNORMAL LOW (ref 39.0–52.0)
HCT: 21.6 % — ABNORMAL LOW (ref 39.0–52.0)
HCT: 31 % — ABNORMAL LOW (ref 39.0–52.0)
HCT: 26.3 % — ABNORMAL LOW (ref 39.0–52.0)
HEMOGLOBIN: 10.2 g/dL — AB (ref 13.0–17.0)
Hemoglobin: 7.4 g/dL — ABNORMAL LOW (ref 13.0–17.0)
Hemoglobin: 7.2 g/dL — ABNORMAL LOW (ref 13.0–17.0)
Hemoglobin: 8.8 g/dL — ABNORMAL LOW (ref 13.0–17.0)
MCH: 31.6 pg (ref 26.0–34.0)
MCH: 31.3 pg (ref 26.0–34.0)
MCH: 29.9 pg (ref 26.0–34.0)
MCH: 30 pg (ref 26.0–34.0)
MCHC: 32.6 g/dL (ref 30.0–36.0)
MCHC: 33.3 g/dL (ref 30.0–36.0)
MCHC: 32.9 g/dL (ref 30.0–36.0)
MCHC: 33.5 g/dL (ref 30.0–36.0)
MCV: 97 fL (ref 78.0–100.0)
MCV: 93.9 fL (ref 78.0–100.0)
MCV: 90.9 fL (ref 78.0–100.0)
MCV: 89.8 fL (ref 78.0–100.0)
PLATELETS: 353 10*3/uL (ref 150–400)
Platelets: 551 10*3/uL — ABNORMAL HIGH (ref 150–400)
Platelets: 377 10*3/uL (ref 150–400)
Platelets: 352 10*3/uL (ref 150–400)
RBC: 2.34 MIL/uL — ABNORMAL LOW (ref 4.22–5.81)
RBC: 2.3 MIL/uL — ABNORMAL LOW (ref 4.22–5.81)
RBC: 3.41 MIL/uL — ABNORMAL LOW (ref 4.22–5.81)
RBC: 2.93 MIL/uL — AB (ref 4.22–5.81)
RDW: 18.6 % — ABNORMAL HIGH (ref 11.5–15.5)
RDW: 17.8 % — AB (ref 11.5–15.5)
RDW: 19.1 % — AB (ref 11.5–15.5)
RDW: 18.8 % — AB (ref 11.5–15.5)
WBC: 56.8 10*3/uL (ref 4.0–10.5)
WBC: 43.7 10*3/uL — AB (ref 4.0–10.5)
WBC: 45.5 10*3/uL — ABNORMAL HIGH (ref 4.0–10.5)
WBC: 32.5 10*3/uL — ABNORMAL HIGH (ref 4.0–10.5)

## 2017-11-30 LAB — PREPARE RBC (CROSSMATCH)

## 2017-11-30 LAB — BASIC METABOLIC PANEL
Anion gap: 6 (ref 5–15)
Anion gap: 8 (ref 5–15)
BUN: 28 mg/dL — AB (ref 6–20)
BUN: 37 mg/dL — AB (ref 6–20)
CALCIUM: 7.8 mg/dL — AB (ref 8.9–10.3)
CHLORIDE: 122 mmol/L — AB (ref 101–111)
CO2: 16 mmol/L — AB (ref 22–32)
CO2: 26 mmol/L (ref 22–32)
Calcium: 4.9 mg/dL — CL (ref 8.9–10.3)
Chloride: 103 mmol/L (ref 101–111)
Creatinine, Ser: 0.92 mg/dL (ref 0.61–1.24)
Creatinine, Ser: 1.37 mg/dL — ABNORMAL HIGH (ref 0.61–1.24)
GFR calc Af Amer: >60 mL/min (ref >60)
GFR calc Af Amer: 53 mL/min — ABNORMAL LOW (ref >60)
GFR calc non Af Amer: >60 mL/min (ref >60)
GFR, EST NON AFRICAN AMERICAN: 46 mL/min — AB (ref >60)
GLUCOSE: 168 mg/dL — AB (ref 65–99)
Glucose, Bld: 167 mg/dL — ABNORMAL HIGH (ref 65–99)
POTASSIUM: 3.1 mmol/L — AB (ref 3.5–5.1)
POTASSIUM: 3.8 mmol/L (ref 3.5–5.1)
SODIUM: 137 mmol/L (ref 135–145)
Sodium: 144 mmol/L (ref 135–145)

## 2017-11-30 LAB — POCT I-STAT, CHEM 8
BUN: 49 mg/dL — AB (ref 6–20)
Calcium, Ion: 1.14 mmol/L — ABNORMAL LOW (ref 1.15–1.40)
Chloride: 99 mmol/L — ABNORMAL LOW (ref 101–111)
Creatinine, Ser: 1.1 mg/dL (ref 0.61–1.24)
Glucose, Bld: 343 mg/dL — ABNORMAL HIGH (ref 65–99)
HEMATOCRIT: 27 % — AB (ref 39.0–52.0)
Hemoglobin: 9.2 g/dL — ABNORMAL LOW (ref 13.0–17.0)
Potassium: 6.6 mmol/L (ref 3.5–5.1)
Sodium: 133 mmol/L — ABNORMAL LOW (ref 135–145)
TCO2: 21 mmol/L — AB (ref 22–32)

## 2017-11-30 LAB — GLUCOSE, CAPILLARY
GLUCOSE-CAPILLARY: 327 mg/dL — AB (ref 65–99)
GLUCOSE-CAPILLARY: 185 mg/dL — AB (ref 65–99)
GLUCOSE-CAPILLARY: 189 mg/dL — AB (ref 65–99)
Glucose-Capillary: 136 mg/dL — ABNORMAL HIGH (ref 65–99)
Glucose-Capillary: 222 mg/dL — ABNORMAL HIGH (ref 65–99)
Glucose-Capillary: 300 mg/dL — ABNORMAL HIGH (ref 65–99)

## 2017-11-30 LAB — APTT: APTT: 28 s (ref 24–36)

## 2017-11-30 LAB — PROTIME-INR
INR: 2.06
INR: 1.4
PROTHROMBIN TIME: 23.1 s — AB (ref 11.4–15.2)
Prothrombin Time: 17.1 seconds — ABNORMAL HIGH (ref 11.4–15.2)

## 2017-11-30 LAB — FIBRINOGEN
FIBRINOGEN: 255 mg/dL (ref 210–475)
Fibrinogen: 359 mg/dL (ref 210–475)

## 2017-11-30 SURGERY — DRAINAGE, PLEURAL EFFUSION
Anesthesia: General | Site: Chest | Laterality: Left

## 2017-11-30 MED ORDER — FENTANYL CITRATE (PF) 100 MCG/2ML IJ SOLN
INTRAMUSCULAR | Status: DC | PRN
  Administered 2017-11-30: 50 ug via INTRAVENOUS
  Administered 2017-11-30: 25 ug via INTRAVENOUS
  Administered 2017-11-30: 50 ug via INTRAVENOUS
  Administered 2017-11-30: 100 ug via INTRAVENOUS
  Administered 2017-11-30: 50 ug via INTRAVENOUS

## 2017-11-30 MED ORDER — ALBUMIN HUMAN 5 % IV SOLN
12.5000 g | Freq: Once | INTRAVENOUS | Status: AC
  Administered 2017-11-30: 12.5 g via INTRAVENOUS
  Filled 2017-11-30: qty 250

## 2017-11-30 MED ORDER — FUROSEMIDE 10 MG/ML IJ SOLN: 20.0000 mg | Freq: Two times a day (BID) | INTRAMUSCULAR | Status: DC

## 2017-11-30 MED ORDER — ONDANSETRON HCL 4 MG/2ML IJ SOLN: 4.0000 mg | Freq: Four times a day (QID) | INTRAMUSCULAR | Status: DC | PRN

## 2017-11-30 MED ORDER — POTASSIUM CHLORIDE 10 MEQ/50ML IV SOLN
10.0000 meq | Freq: Every day | INTRAVENOUS | Status: DC | PRN
  Filled 2017-11-30: qty 50

## 2017-11-30 MED ORDER — ACETAMINOPHEN 500 MG PO TABS
1000.0000 mg | ORAL_TABLET | Freq: Four times a day (QID) | ORAL | Status: AC
  Administered 2017-11-30 – 2017-12-05 (×17): 1000 mg via ORAL
  Filled 2017-11-30 (×18): qty 2

## 2017-11-30 MED ORDER — HEMOSTATIC AGENTS (NO CHARGE) OPTIME
TOPICAL | Status: DC | PRN
  Administered 2017-11-30: 1 via TOPICAL

## 2017-11-30 MED ORDER — PHENYLEPHRINE 40 MCG/ML (10ML) SYRINGE FOR IV PUSH (FOR BLOOD PRESSURE SUPPORT)
PREFILLED_SYRINGE | INTRAVENOUS | Status: AC
  Filled 2017-11-30: qty 10

## 2017-11-30 MED ORDER — TRAMADOL HCL 50 MG PO TABS
50.0000 mg | ORAL_TABLET | Freq: Four times a day (QID) | ORAL | Status: DC | PRN
  Administered 2017-12-03: 100 mg via ORAL
  Filled 2017-11-30: qty 1
  Filled 2017-11-30: qty 2

## 2017-11-30 MED ORDER — FUROSEMIDE 10 MG/ML IJ SOLN
20.0000 mg | Freq: Once | INTRAMUSCULAR | Status: AC
  Administered 2017-11-30: 20 mg via INTRAVENOUS
  Filled 2017-11-30: qty 2

## 2017-11-30 MED ORDER — FENTANYL CITRATE (PF) 250 MCG/5ML IJ SOLN
INTRAMUSCULAR | Status: AC
  Filled 2017-11-30: qty 5

## 2017-11-30 MED ORDER — OXYCODONE HCL 5 MG PO TABS
5.0000 mg | ORAL_TABLET | ORAL | Status: DC | PRN
  Administered 2017-11-30 – 2017-12-01 (×2): 5 mg via ORAL
  Filled 2017-11-30 (×2): qty 1

## 2017-11-30 MED ORDER — SODIUM CHLORIDE 0.9 % IV SOLN
Freq: Once | INTRAVENOUS | Status: AC
  Administered 2017-11-30: 03:00:00 via INTRAVENOUS

## 2017-11-30 MED ORDER — INSULIN ASPART 100 UNIT/ML ~~LOC~~ SOLN
0.0000 [IU] | SUBCUTANEOUS | Status: DC
  Administered 2017-11-30 (×2): 4 [IU] via SUBCUTANEOUS
  Administered 2017-12-01 (×3): 2 [IU] via SUBCUTANEOUS

## 2017-11-30 MED ORDER — ACETAMINOPHEN 160 MG/5ML PO SOLN: 1000.0000 mg | Freq: Four times a day (QID) | ORAL | Status: AC

## 2017-11-30 MED ORDER — TRANEXAMIC ACID 1000 MG/10ML IV SOLN
10.0000 mg/kg | Freq: Once | INTRAVENOUS | Status: DC
  Filled 2017-11-30 (×2): qty 10

## 2017-11-30 MED ORDER — FUROSEMIDE 10 MG/ML IJ SOLN
20.0000 mg | Freq: Once | INTRAMUSCULAR | Status: AC
  Administered 2017-11-30: 20 mg via INTRAVENOUS

## 2017-11-30 MED ORDER — PROPOFOL 10 MG/ML IV BOLUS
INTRAVENOUS | Status: AC
  Filled 2017-11-30: qty 20

## 2017-11-30 MED ORDER — FENTANYL CITRATE (PF) 100 MCG/2ML IJ SOLN
25.0000 ug | INTRAMUSCULAR | Status: DC | PRN
  Administered 2017-11-30: 25 ug via INTRAVENOUS
  Filled 2017-11-30: qty 2

## 2017-11-30 MED ORDER — DEXTROSE-NACL 5-0.45 % IV SOLN
INTRAVENOUS | Status: DC
  Administered 2017-11-30 – 2017-12-01 (×2): via INTRAVENOUS

## 2017-11-30 MED ORDER — PROPOFOL 10 MG/ML IV BOLUS
INTRAVENOUS | Status: DC | PRN
  Administered 2017-11-30: 80 mg via INTRAVENOUS

## 2017-11-30 MED ORDER — SODIUM CHLORIDE 0.9 % IV SOLN
Freq: Once | INTRAVENOUS | Status: AC
  Administered 2017-11-30: 05:00:00 via INTRAVENOUS

## 2017-11-30 MED ORDER — SODIUM CHLORIDE 0.9 % IV SOLN
Freq: Once | INTRAVENOUS | Status: AC
  Administered 2017-11-30: 01:00:00 via INTRAVENOUS

## 2017-11-30 MED ORDER — SODIUM CHLORIDE 0.9 % IV SOLN
2.0000 g | INTRAVENOUS | Status: DC
  Administered 2017-11-30 – 2017-12-04 (×5): 2 g via INTRAVENOUS
  Filled 2017-11-30 (×6): qty 20

## 2017-11-30 MED ORDER — BISACODYL 5 MG PO TBEC
10.0000 mg | DELAYED_RELEASE_TABLET | Freq: Every day | ORAL | Status: DC
  Administered 2017-12-02 – 2017-12-05 (×4): 10 mg via ORAL
  Filled 2017-11-30 (×5): qty 2

## 2017-11-30 MED ORDER — METHYLPREDNISOLONE SODIUM SUCC 40 MG IJ SOLR
40.0000 mg | Freq: Every day | INTRAMUSCULAR | Status: DC
  Administered 2017-12-01: 40 mg via INTRAVENOUS
  Filled 2017-11-30: qty 1

## 2017-11-30 MED ORDER — ONDANSETRON HCL 4 MG/2ML IJ SOLN
INTRAMUSCULAR | Status: AC
  Filled 2017-11-30: qty 2

## 2017-11-30 MED ORDER — LIDOCAINE HCL (PF) 1 % IJ SOLN
INTRAMUSCULAR | Status: AC
  Filled 2017-11-30: qty 30

## 2017-11-30 MED ORDER — METOCLOPRAMIDE HCL 5 MG/ML IJ SOLN
10.0000 mg | Freq: Four times a day (QID) | INTRAMUSCULAR | Status: AC
  Administered 2017-11-30 – 2017-12-05 (×18): 10 mg via INTRAVENOUS
  Filled 2017-11-30 (×17): qty 2

## 2017-11-30 MED ORDER — SENNOSIDES-DOCUSATE SODIUM 8.6-50 MG PO TABS
1.0000 | ORAL_TABLET | Freq: Every day | ORAL | Status: DC
  Administered 2017-11-30: 1 via ORAL
  Filled 2017-11-30: qty 1

## 2017-11-30 MED ORDER — FUROSEMIDE 10 MG/ML IJ SOLN: 20.0000 mg | Freq: Once | INTRAMUSCULAR | Status: DC

## 2017-11-30 MED ORDER — 0.9 % SODIUM CHLORIDE (POUR BTL) OPTIME
TOPICAL | Status: DC | PRN
  Administered 2017-11-30: 3000 mL

## 2017-11-30 MED ORDER — ROCURONIUM BROMIDE 100 MG/10ML IV SOLN
INTRAVENOUS | Status: DC | PRN
  Administered 2017-11-30: 50 mg via INTRAVENOUS

## 2017-11-30 MED ORDER — SUGAMMADEX SODIUM 500 MG/5ML IV SOLN
INTRAVENOUS | Status: DC | PRN
  Administered 2017-11-30: 300 mg via INTRAVENOUS

## 2017-11-30 MED ORDER — SUGAMMADEX SODIUM 500 MG/5ML IV SOLN
INTRAVENOUS | Status: AC
  Filled 2017-11-30: qty 5

## 2017-11-30 MED ORDER — LACTATED RINGERS IV SOLN
INTRAVENOUS | Status: DC
  Administered 2017-11-30: 12:00:00 via INTRAVENOUS

## 2017-11-30 MED ORDER — SODIUM CHLORIDE 0.9 % IV SOLN
1.0000 g | Freq: Once | INTRAVENOUS | Status: AC
  Administered 2017-11-30: 1 g via INTRAVENOUS
  Filled 2017-11-30: qty 10

## 2017-11-30 SURGICAL SUPPLY — 71 items
ADH SKN CLS APL DERMABOND .7 (GAUZE/BANDAGES/DRESSINGS)
BAG DECANTER FOR FLEXI CONT (MISCELLANEOUS) IMPLANT
BLADE SURG 11 STRL SS (BLADE) ×4 IMPLANT
CANISTER SUCT 3000ML PPV (MISCELLANEOUS) ×4 IMPLANT
CATH KIT ON Q 5IN SLV (PAIN MANAGEMENT) IMPLANT
CATH ROBINSON RED A/P 22FR (CATHETERS) IMPLANT
CATH THORACIC 28FR (CATHETERS) IMPLANT
CATH THORACIC 36FR (CATHETERS) IMPLANT
CATH THORACIC 36FR RT ANG (CATHETERS) IMPLANT
CONN ST 1/4X3/8  BEN (MISCELLANEOUS) ×4
CONN ST 1/4X3/8 BEN (MISCELLANEOUS) ×2 IMPLANT
CONT SPEC 4OZ CLIKSEAL STRL BL (MISCELLANEOUS) ×11 IMPLANT
DERMABOND ADVANCED (GAUZE/BANDAGES/DRESSINGS)
DERMABOND ADVANCED .7 DNX12 (GAUZE/BANDAGES/DRESSINGS) IMPLANT
DRAPE LAPAROSCOPIC ABDOMINAL (DRAPES) ×4 IMPLANT
DRAPE WARM FLUID 44X44 (DRAPE) ×4 IMPLANT
ELECT BLADE 4.0 EZ CLEAN MEGAD (MISCELLANEOUS) ×4
ELECT REM PT RETURN 9FT ADLT (ELECTROSURGICAL) ×4
ELECTRODE BLDE 4.0 EZ CLN MEGD (MISCELLANEOUS) ×1 IMPLANT
ELECTRODE REM PT RTRN 9FT ADLT (ELECTROSURGICAL) ×2 IMPLANT
GAUZE SPONGE 4X4 12PLY STRL (GAUZE/BANDAGES/DRESSINGS) ×4 IMPLANT
GLOVE BIO SURGEON STRL SZ 6.5 (GLOVE) ×6 IMPLANT
GLOVE BIO SURGEONS STRL SZ 6.5 (GLOVE) ×3
GOWN STRL REUS W/ TWL LRG LVL3 (GOWN DISPOSABLE) ×6 IMPLANT
GOWN STRL REUS W/TWL LRG LVL3 (GOWN DISPOSABLE) ×12
KIT BASIN OR (CUSTOM PROCEDURE TRAY) ×4 IMPLANT
KIT SUCTION CATH 14FR (SUCTIONS) ×4 IMPLANT
KIT TURNOVER KIT B (KITS) ×4 IMPLANT
NS IRRIG 1000ML POUR BTL (IV SOLUTION) ×16 IMPLANT
PACK CHEST (CUSTOM PROCEDURE TRAY) ×4 IMPLANT
PAD ARMBOARD 7.5X6 YLW CONV (MISCELLANEOUS) ×8 IMPLANT
SEALANT SURG COSEAL 4ML (VASCULAR PRODUCTS) IMPLANT
SOLUTION ANTI FOG 6CC (MISCELLANEOUS) ×7 IMPLANT
SPONGE TONSIL 1 RF SGL (DISPOSABLE) ×6 IMPLANT
SPONGE TONSIL 1.25 RF SGL STRG (GAUZE/BANDAGES/DRESSINGS) ×4 IMPLANT
SUT CHROMIC 3 0 SH 27 (SUTURE) IMPLANT
SUT ETHILON 3 0 FSL (SUTURE) ×6 IMPLANT
SUT ETHILON 3 0 PS 1 (SUTURE) IMPLANT
SUT PROLENE 3 0 SH DA (SUTURE) IMPLANT
SUT PROLENE 4 0 RB 1 (SUTURE) ×4
SUT PROLENE 4-0 RB1 .5 CRCL 36 (SUTURE) IMPLANT
SUT PROLENE 4-0 RB1 18X2 ARM (SUTURE) ×1 IMPLANT
SUT PROLENE 6 0 C 1 30 (SUTURE) IMPLANT
SUT SILK  1 MH (SUTURE) ×8
SUT SILK 1 MH (SUTURE) ×6 IMPLANT
SUT SILK 1 TIES 10X30 (SUTURE) IMPLANT
SUT SILK 2 0SH CR/8 30 (SUTURE) ×3 IMPLANT
SUT SILK 3 0SH CR/8 30 (SUTURE) IMPLANT
SUT VIC AB 1 CTX 18 (SUTURE) IMPLANT
SUT VIC AB 1 CTX 36 (SUTURE) ×4
SUT VIC AB 1 CTX36XBRD ANBCTR (SUTURE) ×1 IMPLANT
SUT VIC AB 2 TP1 27 (SUTURE) ×9 IMPLANT
SUT VIC AB 2-0 CT1 27 (SUTURE) ×4
SUT VIC AB 2-0 CT1 TAPERPNT 27 (SUTURE) ×1 IMPLANT
SUT VIC AB 2-0 CT2 18 VCP726D (SUTURE) IMPLANT
SUT VIC AB 2-0 CTX 36 (SUTURE) IMPLANT
SUT VIC AB 3-0 SH 18 (SUTURE) IMPLANT
SUT VIC AB 3-0 SH 8-18 (SUTURE) ×6 IMPLANT
SUT VIC AB 3-0 X1 27 (SUTURE) ×3 IMPLANT
SUT VICRYL 0 UR6 27IN ABS (SUTURE) IMPLANT
SUT VICRYL 2 TP 1 (SUTURE) IMPLANT
SWAB COLLECTION DEVICE MRSA (MISCELLANEOUS) IMPLANT
SWAB CULTURE ESWAB REG 1ML (MISCELLANEOUS) IMPLANT
SYSTEM SAHARA CHEST DRAIN ATS (WOUND CARE) ×4 IMPLANT
TAPE CLOTH SURG 4X10 WHT LF (GAUZE/BANDAGES/DRESSINGS) ×3 IMPLANT
TIP APPLICATOR SPRAY EXTEND 16 (VASCULAR PRODUCTS) IMPLANT
TOWEL GREEN STERILE (TOWEL DISPOSABLE) ×4 IMPLANT
TOWEL GREEN STERILE FF (TOWEL DISPOSABLE) ×4 IMPLANT
TRAP SPECIMEN MUCOUS 40CC (MISCELLANEOUS) IMPLANT
TRAY FOLEY MTR SLVR 16FR STAT (SET/KITS/TRAYS/PACK) ×4 IMPLANT
WATER STERILE IRR 1000ML POUR (IV SOLUTION) ×8 IMPLANT

## 2017-11-30 NOTE — Brief Op Note (Signed)
11/20/2017 - 11/30/2017  12:46 PM  PATIENT:  Oscar Baker  84 y.o. male  PRE-OPERATIVE DIAGNOSIS:  respiratorty failure  POST-OPERATIVE DIAGNOSIS:  respiratorty failure  PROCEDURE:  Procedure(s): DRAINAGE OF HEMOTHORAX (Left) VIDEO ASSISTED THORACOSCOPY (Left)  SURGEON:  Surgeon(s) and Role:    Ivin Poot, MD - Primary  PHYSICIAN ASSISTANT:  Nicholes Rough, PA-C   ANESTHESIA:   general  EBL:  250 mL   BLOOD ADMINISTERED:none  DRAINS: TWO BLAKE CHEST TUBES   LOCAL MEDICATIONS USED:  NONE  SPECIMEN:  Source of Specimen:  BLOOD CLOT  DISPOSITION OF SPECIMEN:  N/A  COUNTS:  YES   DICTATION: .Dragon Dictation  PLAN OF CARE: Admit to inpatient   PATIENT DISPOSITION:  ICU - extubated and stable.   Delay start of Pharmacological VTE agent (>24hrs) due to surgical blood loss or risk of bleeding: yes

## 2017-11-30 NOTE — Anesthesia Postprocedure Evaluation (Signed)
Anesthesia Post Note  Patient: LASZLO ELLERBY  Procedure(s) Performed: DRAINAGE OF HEMOTHORAX (Left Chest) VIDEO ASSISTED THORACOSCOPY (Left Chest)     Patient location during evaluation: PACU Anesthesia Type: General Level of consciousness: awake and alert Pain management: pain level controlled Vital Signs Assessment: post-procedure vital signs reviewed and stable Respiratory status: spontaneous breathing, nonlabored ventilation, respiratory function stable and patient connected to nasal cannula oxygen Cardiovascular status: blood pressure returned to baseline and stable Postop Assessment: no apparent nausea or vomiting Anesthetic complications: no    Last Vitals:  Vitals:   11/30/17 1319 11/30/17 1330  BP:    Pulse: 89 90  Resp: (!) 24 20  Temp:  (!) 36.3 C  SpO2: 93% 97%    Last Pain:  Vitals:   11/30/17 1330  TempSrc:   PainSc: Asleep                 Shakea Isip,W. EDMOND

## 2017-11-30 NOTE — Progress Notes (Signed)
I have discussed the patient's wishes with the patient's wife Ms Champion Corales  and pt's Brother [name] at bedside -- Pt is a FULL CODE, they Desire/Request  life sustaining measures be used and that artificial means of life support  be employed including cardioversion, mechanical ventilation, chest compressions, pressors, and CPR as indicated  Roxan Hockey, MD

## 2017-11-30 NOTE — Progress Notes (Signed)
NT suctioned patient. Pt tolerated well without any complications. I was able to obtain small amount of clear sputum and pt coughed but not enough to clear airway. Pt felt nauseous after NT suctioning and RN aware and at bedside during procedure. RT to cont to monitor.

## 2017-11-30 NOTE — Progress Notes (Signed)
Dr. Roxan Hockey spoke with regarding pts continued decline in status. Vital signs, labs, and intake and outputs reviewed. Orders received for labs, two units prbc, and upper ceiling of levophed to 64mcg. Will continue to monitor closely. Eleonore Chiquito RN 2 Heart CVICU

## 2017-11-30 NOTE — Progress Notes (Signed)
CRITICAL VALUE ALERT  Critical Value:  CA 4.9   Date & Time Notied:  11/30/17   Provider Notified: PVT to round  Orders Received/Actions taken: pending

## 2017-11-30 NOTE — Procedures (Signed)
Oscar Baker had a drainage of empyema and decortication yesterday.  He has had low BP throughout the night. CXR showed a left hemothorax. CT in good position but only moderate output. Being resuscitated with PRBC and needs FFP to correct coagulopathy. He has limited IV access and needs central access for volume administration and to monitor CVP.   I informed him of the indications, risks and benefits of the procedure. He agrees to proceed.  Time out performed  Using sterile technique and 5 ml of 1% lidocaine local anesthesia a triple lumen catheter was placed in the right subclavian vein. Vein easily accessed but hard to keep needle in lumen to pass wire c/w hypovolemia. Once wire passed, catheter passed easily. Good return from all 3 ports. Patient tolerated the procedure well.  CXR confirmed position of catheter  CVP= 4. Continue volume resuscitation.  Oscar Standard Roxan Hockey, MD [facility] [phone]

## 2017-11-30 NOTE — Progress Notes (Signed)
11/30/2017 0810 hrs. Last seen by pulmonary critical care on 11/26/2017 He has been taken to the operating room per thoracic services and a video-assisted thorascopic intervention has been performed. Pulmonary critical care will be available as needed and will be delighted to see the patient again whenever thoracic surgery so requires.  Richardson Landry Minor ACNP [facility] PCCM Pager [phone] till 1 pm If no answer page [phone] 11/30/2017, 8:10 AM

## 2017-11-30 NOTE — Progress Notes (Addendum)
Inpatient Diabetes Program Recommendations  AACE/ADA: New Consensus Statement on Inpatient Glycemic Control (2015)  Target Ranges:  Prepandial:   less than 140 mg/dL      Peak postprandial:   less than 180 mg/dL (1-2 hours)      Critically ill patients:  140 - 180 mg/dL   Lab Results  Component Value Date   GLUCAP 222 (H) 11/30/2017   HGBA1C 6.0 (H) 05/05/2016    Review of Glycemic Control Results for Oscar Baker, Oscar Baker (MRN [id number]) as of 11/30/2017 11:14  Ref. Range 11/29/2017 22:30 11/30/2017 00:00 11/30/2017 04:13 11/30/2017 07:55  Glucose-Capillary Latest Ref Range: 65 - 99 mg/dL 268 (H) 300 (H) 327 (H) 222 (H)   Diabetes history: Type 2 DM Outpatient Diabetes medications: Glipizide 2.5 mg QD, Metformin 1000 mg BID, Januvia 100 mg QD Current orders for Inpatient glycemic control: Novolog 0-24 units Q4H, Solumedrol 40 mg QD  Inpatient Diabetes Program Recommendations:    Noted administration of Decadron 10 mg x1, thus expect BS to be increased, especially with repeated dose of Solumedrol. At this time, consider adding back Lantus 20 units QHS.    Thanks, Bronson Curb, MSN, RNC-OB Diabetes Coordinator [phone] (8a-5p)

## 2017-11-30 NOTE — Progress Notes (Signed)
Dr Roxan Hockey spoke with regarding radiology report, pts current vital signs, and condition. Will continue to monitor closely. Eleonore Chiquito RN 2 Heart [facility]

## 2017-11-30 NOTE — Progress Notes (Addendum)
Patient Demographics:    Oscar Baker, is a 84 y.o. male, DOB - 1933/05/30, OIN:[id number]  Admit date - 11/20/2017   Admitting Physician Jani Gravel, MD  Outpatient Primary MD for the patient is [name], MD  LOS - 10   Chief Complaint  Patient presents with  . Cough  . Fever        Subjective:    Oscar Baker is sleepy due to postop state, patient's wife and brother at bedside, questions answered  Assessment  & Plan :    Principal Problem:   HCAP (healthcare-associated pneumonia) Active Problems:   HTN (hypertension)   Hyperlipidemia   Diabetes mellitus type 2, noninsulin dependent (HCC)   LPRD (laryngopharyngeal reflux disease)   Other allergic rhinitis   Anemia   Chronic cough   CLL (chronic lymphocytic leukemia) (HCC)   Pleural effusion   Pneumonia of left lower lobe due to infectious organism ([place])   Mediastinal lymphadenopathy   Hypoxemia   Sepsis ([place])   S/P thoracotomy               Brief Narrative:     84 year-old male with a history of CLL, prostate cancer, sick sinus syndrome, pacemaker, paroxysmal atrial fibrillation, CHF with ejection fraction 45% admitted with fever increasing shortness of breath cough with yellow to green phlegm for 1 week prior to admission. He was found to have left lower lobe pneumonia with associated effusion and is admitted for the treatment of the same.  Thoracentesis was attempted on 11/25/2017 by pulmonology service but had to be aborted due to coughing.  IR did thoracentesis on 11/26/2017 with only 70 cc fluid obtained.  CT surgery consulted , for VIDEO ASSISTED THORACOSCOPY (VATS)/EMPYEMA (Left) on 11/29/17 due to persistent pleural effusion despite 2 failed attempts at thoracentesis with minimal fluid drained.    Plan:- 1)HCAP/Haemophilus influenza Bacteremia complicated with loculated Lt Pleural Effusion, CT surgery consulted , had Lt sided VATS   on 11/29/17, developed significant hemothorax post VATS, went back to OR for exploration and drainage of hemothorax on 11/30/17, 32 F chest tube placed,  continue IV Rocephin (started 11/21/17),  repeat blood cultures negative so far, persistent leukocytosis may be secondary to CLL and steroids.  Pleural fluid cultures remains negative so far  2)PAFib-Coumadin on hold since 11/27/2017 to allow for VATS, continue Coreg for rate control,  3)HFrEF-last known EF about 45 to 50% from January 2019, continue Lasix 20 mg daily, continue Coreg  4)CLL--persistent leukocytosis mostly secondary to underlying CLL  5)DM-glycemic control worsened with steroids, continue Lantus 20 units, Use Novolog/Humalog Sliding scale insulin with Accu-Cheks/Fingersticks as ordered   6)Acute Blood Loss Anemia due to large left-sided hemothorax in the setting of VAT procedure, received 3 units of packed cells on 11/29/2017, received 4 units of FFP on 11/29/2017, also received 2 units of packed cells on 11/30/2017, received 1 unit of FFP on 11/30/2017, and then received one platelet transfusion on 11/30/2017, posttransfusion hemoglobin on 11/30/2017 is 10.2  7)Social/Ethics/Code Status : I have discussed the patient's wishes with the patient's wife Oscar Baker  and pt's Brother Oscar Baker at bedside -- Pt is a FULL CODE, they Desire/Request  life sustaining measures be used and that artificial means of  life support  be employed including cardioversion, mechanical ventilation, chest compressions, pressors, and CPR as indicated  Disposition Plan  : TBD  Consults  :  CT surgery, PCCM/IR  Procedures 11/29/17--- Lt VATS 11/29/17--- right subclavian central line placement 11/30/17--evacuation of Lt hemothorax   DVT Prophylaxis  : Coumadin on hold to allow for VATs  Lab Results  Component Value Date   PLT 352 11/30/2017    Inpatient Medications  Scheduled Meds: . acetaminophen  1,000 mg Oral Q6H   Or  . acetaminophen (TYLENOL)  oral liquid 160 mg/5 mL  1,000 mg Oral Q6H  . atorvastatin  20 mg Oral q1800  . bisacodyl  10 mg Oral Daily  . insulin aspart  0-24 Units Subcutaneous Q4H  . [START ON 12/01/2017] methylPREDNISolone (SOLU-MEDROL) injection  40 mg Intravenous Daily  . metoCLOPramide (REGLAN) injection  10 mg Intravenous Q6H  . senna-docusate  1 tablet Oral QHS   Continuous Infusions: . dextrose 5 % and 0.45% NaCl 100 mL/hr at 11/30/17 1500  . potassium chloride     PRN Meds:.fentaNYL (SUBLIMAZE) injection, ondansetron (ZOFRAN) IV, oxyCODONE, potassium chloride, traMADol    Anti-infectives (From admission, onward)   Start     Dose/Rate Route Frequency Ordered Stop   11/29/17 2200  vancomycin (VANCOCIN) 500 mg in sodium chloride 0.9 % 100 mL IVPB  Status:  Discontinued     500 mg 100 mL/hr over 60 Minutes Intravenous Every 12 hours 11/29/17 2157 11/30/17 1357   11/29/17 2200  piperacillin-tazobactam (ZOSYN) IVPB 3.375 g  Status:  Discontinued     3.375 g 12.5 mL/hr over 240 Minutes Intravenous Every 8 hours 11/29/17 2157 11/30/17 1357   11/29/17 1300  ceFAZolin (ANCEF) IVPB 2g/100 mL premix     2 g 200 mL/hr over 30 Minutes Intravenous 30 min pre-op 11/28/17 1215 11/29/17 1722   11/21/17 1600  cefTRIAXone (ROCEPHIN) 2 g in sodium chloride 0.9 % 100 mL IVPB  Status:  Discontinued     2 g 200 mL/hr over 30 Minutes Intravenous Every 24 hours 11/21/17 1553 11/29/17 2135   11/21/17 0600  vancomycin (VANCOCIN) IVPB 750 mg/150 ml premix  Status:  Discontinued     750 mg 150 mL/hr over 60 Minutes Intravenous Every 12 hours 11/20/17 1703 11/21/17 1553   11/21/17 0230  ceFEPIme (MAXIPIME) 1 g in sodium chloride 0.9 % 100 mL IVPB  Status:  Discontinued     1 g 200 mL/hr over 30 Minutes Intravenous Every 8 hours 11/20/17 1835 11/21/17 1553   11/20/17 1930  azithromycin (ZITHROMAX) 500 mg in sodium chloride 0.9 % 250 mL IVPB  Status:  Discontinued     500 mg 250 mL/hr over 60 Minutes Intravenous Every 24 hours  11/20/17 1926 11/21/17 1553   11/20/17 1715  vancomycin (VANCOCIN) 2,000 mg in sodium chloride 0.9 % 500 mL IVPB     2,000 mg 250 mL/hr over 120 Minutes Intravenous  Once 11/20/17 1703 11/20/17 2055   11/20/17 1700  ceFEPIme (MAXIPIME) 1 g in sodium chloride 0.9 % 100 mL IVPB     1 g 200 mL/hr over 30 Minutes Intravenous  Once 11/20/17 1657 11/20/17 1854        Objective:   Vitals:   11/30/17 1525 11/30/17 1600 11/30/17 1615 11/30/17 1635  BP:  (!) 98/42 (!) 97/42   Pulse:  60    Resp:  (!) _0 Temp:      TempSrc:      SpO2:  98%  Weight: 92.1 kg (203 lb 0.7 oz)     Height:        Wt Readings from Last 3 Encounters:  11/30/17 92.1 kg (203 lb 0.7 oz)  11/09/17 97.2 kg (214 lb 3.2 oz)  11/04/17 96.4 kg (212 lb 8 oz)     Intake/Output Summary (Last 24 hours) at 11/30/2017 1800 Last data filed at 11/30/2017 1642 Gross per 24 hour  Intake 8370.92 ml  Output 1720 ml  Net 6650.92 ml     Physical Exam  Gen:-Sleepy, post op HEENT:- [place].AT, No sclera icterus Ears-very very hard of hearing  neck-Supple Neck,No JVD, right subclavian central line Lungs-left-sided chest tubes with sanguinous drainage  CV- S1, S2 normal Abd-  +ve B.Sounds, Abd Soft, No tenderness,    Extremity/Skin:- No  edema,   good pulses Psych-Sleepy due to postoperative state  neuro-no new focal deficits, no tremors   Data Review:   Micro Results Recent Results (from the past 240 hour(s))  Culture, sputum-assessment     Status: None   Collection Time: 11/20/17  6:32 PM  Result Value Ref Range Status   Specimen Description EXPECTORATED SPUTUM  Final   Special Requests Normal  Final   Sputum evaluation   Final    THIS SPECIMEN IS ACCEPTABLE FOR SPUTUM CULTURE Performed at [facility] [address], [place], [place] [address]    Report Status 11/20/2017 FINAL  Final  Culture, respiratory (NON-Expectorated)     Status: None   Collection Time: 11/20/17  6:32 PM  Result Value Ref  Range Status   Specimen Description EXPECTORATED SPUTUM  Final   Special Requests Normal Reflexed from [id number]  Final   Gram Stain   Final    MODERATE WBC PRESENT, PREDOMINANTLY PMN RARE SQUAMOUS EPITHELIAL CELLS PRESENT ABUNDANT GRAM POSITIVE COCCI IN PAIRS IN CLUSTERS MODERATE GRAM NEGATIVE RODS    Culture   Final    Consistent with normal respiratory flora. Performed at [facility], [place] [address], [place], [place] [address]    Report Status 11/23/2017 FINAL  Final  MRSA PCR Screening     Status: None   Collection Time: 11/20/17  8:28 PM  Result Value Ref Range Status   MRSA by PCR NEGATIVE NEGATIVE Final    Comment:        The GeneXpert MRSA Assay (FDA approved for NASAL specimens only), is one component of a comprehensive MRSA colonization surveillance program. It is not intended to diagnose MRSA infection nor to guide or monitor treatment for MRSA infections. Performed at [facility], [place] [address], [place], [place] [address]   Culture, blood (routine x 2)     Status: None   Collection Time: 11/24/17  1:00 PM  Result Value Ref Range Status   Specimen Description BLOOD LEFT ANTECUBITAL  Final   Special Requests   Final    BOTTLES DRAWN AEROBIC AND ANAEROBIC Blood Culture adequate volume   Culture   Final    NO GROWTH 5 DAYS Performed at [facility], [place] [address] [place], [place] [address]    Report Status 11/29/2017 FINAL  Final  Culture, blood (routine x 2)     Status: Abnormal   Collection Time: 11/24/17  1:09 PM  Result Value Ref Range Status   Specimen Description BLOOD RIGHT ANTECUBITAL  Final   Special Requests   Final    BOTTLES DRAWN AEROBIC ONLY Blood Culture results may not be optimal due to an inadequate  volume of blood received in culture bottles   Culture  Setup Time   Final    GRAM POSITIVE COCCI AEROBIC BOTTLE ONLY CRITICAL RESULT CALLED TO, READ BACK BY AND VERIFIED WITH: Ferne Coe PharmD 10:25 11/25/17 ([email or web address])     Culture (A)  Final    STAPHYLOCOCCUS SPECIES (COAGULASE NEGATIVE) THE SIGNIFICANCE OF ISOLATING THIS ORGANISM FROM A SINGLE SET OF BLOOD CULTURES WHEN MULTIPLE SETS ARE DRAWN IS UNCERTAIN. PLEASE NOTIFY THE MICROBIOLOGY DEPARTMENT WITHIN ONE WEEK IF SPECIATION AND SENSITIVITIES ARE REQUIRED. Performed at [facility], [place] [address] [place], [place] [address]    Report Status 11/27/2017 FINAL  Final  Blood Culture ID Panel (Reflexed)     Status: Abnormal   Collection Time: 11/24/17  1:09 PM  Result Value Ref Range Status   Enterococcus species NOT DETECTED NOT DETECTED Final   Listeria monocytogenes NOT DETECTED NOT DETECTED Final   Staphylococcus species DETECTED (A) NOT DETECTED Final    Comment: Methicillin (oxacillin) resistant coagulase negative staphylococcus. Possible blood culture contaminant (unless isolated from more than one blood culture draw or clinical case suggests pathogenicity). No antibiotic treatment is indicated for blood  culture contaminants. CRITICAL RESULT CALLED TO, READ BACK BY AND VERIFIED WITH: Ferne Coe PharmD 10:25 11/25/17 ([email or web address])    Staphylococcus aureus NOT DETECTED NOT DETECTED Final   Methicillin resistance DETECTED (A) NOT DETECTED Final    Comment: CRITICAL RESULT CALLED TO, READ BACK BY AND VERIFIED WITH: Ferne Coe PharmD 10:25 11/25/17 ([email or web address])    Streptococcus species NOT DETECTED NOT DETECTED Final   Streptococcus agalactiae NOT DETECTED NOT DETECTED Final   Streptococcus pneumoniae NOT DETECTED NOT DETECTED Final   Streptococcus pyogenes NOT DETECTED NOT DETECTED Final   Acinetobacter baumannii NOT DETECTED NOT DETECTED Final   Enterobacteriaceae species NOT DETECTED NOT DETECTED Final   Enterobacter cloacae complex NOT DETECTED NOT DETECTED Final   Escherichia coli NOT DETECTED NOT DETECTED Final   Klebsiella oxytoca NOT DETECTED NOT DETECTED Final   Klebsiella pneumoniae NOT DETECTED NOT DETECTED Final   Proteus species NOT DETECTED  NOT DETECTED Final   Serratia marcescens NOT DETECTED NOT DETECTED Final   Haemophilus influenzae NOT DETECTED NOT DETECTED Final   Neisseria meningitidis NOT DETECTED NOT DETECTED Final   Pseudomonas aeruginosa NOT DETECTED NOT DETECTED Final   Candida albicans NOT DETECTED NOT DETECTED Final   Candida glabrata NOT DETECTED NOT DETECTED Final   Candida krusei NOT DETECTED NOT DETECTED Final   Candida parapsilosis NOT DETECTED NOT DETECTED Final   Candida tropicalis NOT DETECTED NOT DETECTED Final    Comment: Performed at [facility], [address], [place], [place] [address]  Gram stain     Status: None   Collection Time: 11/25/17  3:43 PM  Result Value Ref Range Status   Specimen Description FLUID PLEURAL  Final   Special Requests NONE  Final   Gram Stain   Final    ABUNDANT WBC PRESENT,BOTH PMN AND MONONUCLEAR NO ORGANISMS SEEN Performed at [facility], [address] [place], [place] [address]    Report Status 11/25/2017 FINAL  Final  Body fluid culture     Status: None   Collection Time: 11/26/17  1:01 PM  Result Value Ref Range Status   Specimen Description PLEURAL FLUID LEFT  Final   Special Requests NONE  Final   Gram Stain   Final    MODERATE WBC PRESENT,BOTH PMN AND MONONUCLEAR NO ORGANISMS SEEN    Culture  Final    NO GROWTH 3 DAYS Performed at [facility], [place] [address] [place], [place] [address]    Report Status 11/29/2017 FINAL  Final  Surgical pcr screen     Status: None   Collection Time: 11/27/17  2:54 PM  Result Value Ref Range Status   MRSA, PCR NEGATIVE NEGATIVE Final   Staphylococcus aureus NEGATIVE NEGATIVE Final    Comment: (NOTE) The Xpert SA Assay (FDA approved for NASAL specimens in patients 34 years of age and older), is one component of a comprehensive surveillance program. It is not intended to diagnose infection nor to guide or monitor treatment. Performed at [facility], [place] [address], [place],  [place] [address]   Aerobic/Anaerobic Culture (surgical/deep wound)     Status: None (Preliminary result)   Collection Time: 11/29/17  5:47 PM  Result Value Ref Range Status   Specimen Description FLUID LEFT PLEURAL  Final   Special Requests NONE  Final   Gram Stain   Final    RARE WBC PRESENT, PREDOMINANTLY PMN NO ORGANISMS SEEN    Culture   Final    NO GROWTH < 24 HOURS Performed at [facility], [place] [address] [place], [place] [address]    Report Status PENDING  Incomplete  Aerobic/Anaerobic Culture (surgical/deep wound)     Status: None (Preliminary result)   Collection Time: 11/29/17  5:55 PM  Result Value Ref Range Status   Specimen Description TISSUE  Final   Special Requests LEFT PLEURAL PEEL  Final   Gram Stain   Final    MODERATE WBC PRESENT, PREDOMINANTLY PMN NO ORGANISMS SEEN    Culture   Final    NO GROWTH < 24 HOURS Performed at [facility], [place] [address], [place], [place] [address]    Report Status PENDING  Incomplete    Radiology Reports Dg Chest 1 View  Result Date: 11/26/2017 CLINICAL DATA:  Status post thoracentesis. EXAM: CHEST  1 VIEW COMPARISON:  Yesterday. FINDINGS: Significant decrease in amount of left pleural fluid following thoracentesis. No pneumothorax. Stable enlarged cardiac silhouette. Dense airspace opacity at the left lung base, increased. Clear right lung with an overlying skin fold. Bilateral shoulder degenerative changes. IMPRESSION: 1. No pneumothorax following left thoracentesis. 2. Dense left lower lobe atelectasis or pneumonia, significantly increased. 3. Stable cardiomegaly. Electronically Signed   By: Claudie Revering M.D.   On: 11/26/2017 13:06   Dg Chest 1 View  Result Date: 11/23/2017 CLINICAL DATA:  Hypoxia.  Atrial fibrillation. EXAM: CHEST  1 VIEW COMPARISON:  November 22, 2017 FINDINGS: There is opacity throughout much of the left lung, in part due to pleural effusion. There is also patchy airspace consolidation throughout much of  the left lung. There is slight atelectatic change in the right base. The right lung is otherwise clear. Heart is mildly enlarged with pulmonary vascularity within normal limits. Pacemaker lead is attached to the right ventricle. No adenopathy. There is aortic atherosclerosis. There is degenerative change in each shoulder. IMPRESSION: Opacity throughout much of the left lung likely due to a combination of pleural effusion and airspace consolidation, essentially stable. Slight right base atelectasis. Right lung otherwise clear. Stable cardiomegaly. Stable pacemaker placement. There is aortic atherosclerosis. Aortic Atherosclerosis (ICD10-I70.0). Electronically Signed   By: Lowella Grip III M.D.   On: 11/23/2017 08:17   Dg Chest 1 View  Result Date: 11/22/2017 CLINICAL DATA:  Progressive cough, congestion, and shortness of breath. Fall. EXAM: CHEST  1 VIEW COMPARISON:  Two-view chest x-ray 11/20/2017 FINDINGS: The heart size is exaggerated by low lung volumes. Increased asymmetric left-sided interstitial and airspace disease is noted. A left pleural effusion is present. Right-sided interstitial disease is increasing. IMPRESSION: 1. Increasing interstitial and airspace disease in the left lung consistent with edema and infection. 2. Increasing left pleural effusion. 3. Progressive edema in the right lung. Electronically Signed   By: San Morelle M.D.   On: 11/22/2017 07:34   Dg Chest 2 View  Result Date: 11/20/2017 CLINICAL DATA:  84 year old with chronic cough, presenting with acute worsening, associated with congestion, shortness of breath and fever. EXAM: CHEST - 2 VIEW COMPARISON:  07/21/2017, 07/19/2017, 01/08/2015 and earlier. FINDINGS: AP ERECT and LATERAL images were obtained. LEFT subclavian single lead transvenous pacemaker with the lead tip at the expected location of the RV apex, unchanged. Cardiac silhouette moderately enlarged, unchanged since earlier this year but increased in size  since 2016. Thoracic aorta mildly atherosclerotic, unchanged. Hilar and mediastinal contours otherwise unremarkable. Airspace consolidation involving the LEFT lower lobe associated with a LEFT pleural effusion. Pulmonary venous hypertension without overt edema. Degenerative changes involving the thoracic and UPPER lumbar spine. IMPRESSION: 1. Acute LEFT lower lobe pneumonia with an associated LEFT pleural effusion. 2. Stable moderate cardiomegaly. Pulmonary venous hypertension without overt edema. Electronically Signed   By: Evangeline Dakin M.D.   On: 11/20/2017 14:10   Ct Head Wo Contrast  Result Date: 11/20/2017 CLINICAL DATA:  Tripped and fell 3 days ago, hitting the head. Bruising to the right temple. On anticoagulation. Initial encounter. EXAM: CT HEAD WITHOUT CONTRAST TECHNIQUE: Contiguous axial images were obtained from the base of the skull through the vertex without intravenous contrast. COMPARISON:  07/20/2017 FINDINGS: Brain: There is no evidence of acute infarct, intracranial hemorrhage, mass, midline shift, or extra-axial fluid collection. Mild cerebral atrophy is unchanged. Periventricular white matter hypodensities are also unchanged and nonspecific but compatible with mild chronic small vessel ischemic disease. Vascular: Calcified atherosclerosis at the skull base. No hyperdense vessel. Skull: No fracture or focal osseous lesion. Sinuses/Orbits: Improved paranasal sinus aeration. Moderate residual right ethmoid and right maxillary sinus mucosal thickening. Large right mastoid effusion, mildly increased from prior. Resolved right middle ear opacification. At most trace fluid at the left mastoid tip. Other: Small right frontotemporal scalp hematoma. IMPRESSION: 1. No evidence of acute intracranial abnormality. 2. Small right frontotemporal scalp hematoma. 3. Mild chronic small vessel ischemic disease. 4. Persistent large right mastoid effusion. Resolved right middle ear effusion. 5. Improved  paranasal sinus aeration. Electronically Signed   By: Logan Bores M.D.   On: 11/20/2017 15:14   Ct Chest Wo Contrast  Result Date: 11/23/2017 CLINICAL DATA:  Left pleural effusion. EXAM: CT CHEST WITHOUT CONTRAST TECHNIQUE: Multidetector CT imaging of the chest was performed following the standard protocol without IV contrast. COMPARISON:  Chest x-ray from same day. CT chest dated April 17, 2014. FINDINGS: Cardiovascular: Mild cardiomegaly with unchanged left chest wall pacemaker. No pericardial effusion. Normal caliber thoracic aorta. Coronary, aortic arch, and branch vessel atherosclerotic vascular disease. Mediastinum/Nodes: Multiple prominent bilateral axillary, left supraclavicular, and mediastinal lymph nodes measuring up to 10 mm in short axis are overall similar to prior study. The thyroid gland trachea, and esophagus demonstrate no significant findings. Lungs/Pleura: Moderate to large left pleural effusion. Trace right pleural effusion. Complete collapse of the left lower lobe. Areas of mucous impaction in the right lower lobe with subsegmental atelectasis. Mild diffuse peribronchial thickening. No consolidation or pneumothorax. No  suspicious pulmonary nodule. Mild mosaic attenuation throughout the lungs. Upper Abdomen: No acute abnormality. Musculoskeletal: No chest wall abnormality. No acute or significant osseous findings. Degenerative changes of the thoracic spine. IMPRESSION: 1. Moderate to large left pleural effusion with complete collapse of the left lower lobe. 2. Trace right pleural effusion with right lower lobe subsegmental atelectasis. 3. Mild mosaic attenuation throughout the lungs could reflect chronic small airways or small vessel disease. 4. Prominent bilateral axillary, left supraclavicular, and mediastinal lymph nodes measuring up to 10 mm in short axis are overall similar to prior study and likely reflect patient's history of chronic lymphocytic leukemia. 5.  Aortic atherosclerosis  (ICD10-I70.0). Electronically Signed   By: Titus Dubin M.D.   On: 11/23/2017 18:00   Dg Chest Port 1 View  Result Date: 11/30/2017 CLINICAL DATA:  Status post thoracotomy. History of leukemia, atrial fibrillation. EXAM: PORTABLE CHEST 1 VIEW COMPARISON:  Chest radiograph November 30, 2017 FINDINGS: Stable cardiomegaly. Two LEFT apical chest tubes in place. LEFT apical pleural thickening. No pneumothorax. RIGHT subclavian central venous catheter distal tip projects in distal superior vena cava. Bibasilar strandy densities. Single lead LEFT cardiac pacemaker. Similar LEFT chest wall subcutaneous gas. IMPRESSION: Stable life-support lines including 2 LEFT apical chest tubes. LEFT apical pleural thickening and bibasilar atelectasis. No pneumothorax. Stable cardiomegaly. Electronically Signed   By: Elon Alas M.D.   On: 11/30/2017 14:25   Dg Chest Port 1 View  Result Date: 11/30/2017 CLINICAL DATA:  Status post VATS on the left EXAM: PORTABLE CHEST 1 VIEW COMPARISON:  11/30/2017 FINDINGS: Pacemaker is again identified and stable. Right central venous line is noted and stable. Two chest tubes are seen on the left. Significant improved aeration in the left is noted when compared with the prior exam. Degenerative changes of the thoracic spine are noted. IMPRESSION: Significant improved aeration on the left. Electronically Signed   By: Inez Catalina M.D.   On: 11/30/2017 13:50   Dg Chest Port 1 View  Result Date: 11/30/2017 CLINICAL DATA:  Central line placement. EXAM: PORTABLE CHEST 1 VIEW COMPARISON:  Radiograph earlier this day at 0014 hour FINDINGS: Right subclavian central line with tip in the distal SVC. No right pneumothorax. Left chest tube is unchanged in position with tip directed towards the apex. Slight decreased density of the suspected extrapleural left lung opacity. Decreased conspicuity of the pneumothorax versus pneumomediastinum adjacent to the left heart border. Linear atelectasis in the  right midlung versus fluid in the fissure. IMPRESSION: 1. Right subclavian central line with tip in the distal SVC. No right pneumothorax. 2. Slightly decreased density of the presumed extrapleural opacity in the left hemithorax. Left chest tubes remain in place. Decreasing conspicuity of the pneumomediastinum versus medial pneumothorax adjacent to the left heart border. Electronically Signed   By: Jeb Levering M.D.   On: 11/30/2017 04:28   Dg Chest Port 1 View  Result Date: 11/30/2017 CLINICAL DATA:  Thoracotomy scar left chest. EXAM: PORTABLE CHEST 1 VIEW COMPARISON:  Radiograph yesterday. FINDINGS: Significant change from prior exam with near complete opacification of the left hemithorax, opacity primarily laterally and superiorly with lobular borders, suggesting extrapleural process. Two left chest tubes are in place. Pneumomediastinum versus medial pneumothorax along the left heart border. Left-sided pacemaker in place. Mild cardiomegaly is similar. Right lung is grossly clear. IMPRESSION: 1. Significant change from prior exam with near complete opacification of left hemithorax, opacity primarily peripheral and superiorly with lobular borders suggesting extrapleural. Given recent thoracoscopy, hemothorax is primary concern.  2. Pneumomediastinum versus pneumothorax along the left heart border. These results will be called to the ordering clinician or representative by the Radiologist Assistant, and communication documented in the PACS or zVision Dashboard. Electronically Signed   By: Jeb Levering M.D.   On: 11/30/2017 01:14   Dg Chest Port 1 View  Result Date: 11/29/2017 CLINICAL DATA:  Postop VATS.  Empyema. EXAM: PORTABLE CHEST 1 VIEW COMPARISON:  Chest radiograph 11/29/2017 FINDINGS: Left-sided chest tube and drain tips are at the left lung apex. There is bibasilar atelectasis without focal consolidation. No pneumothorax or sizable pleural effusion. Unchanged cardiomegaly. IMPRESSION: No  pneumothorax. Electronically Signed   By: Ulyses Jarred M.D.   On: 11/29/2017 20:20   Dg Chest Port 1 View  Result Date: 11/29/2017 CLINICAL DATA:  Shortness of Breath EXAM: PORTABLE CHEST 1 VIEW COMPARISON:  11/27/2017 FINDINGS: Cardiac shadow is stable. Pacing device is again seen. Right lung is clear. Small left pleural effusion is noted. There is a rounded area of density noted laterally likely related to a loculated component. No bony abnormality is seen. IMPRESSION: Stable appearing left pleural effusion with some degree of loculation. Electronically Signed   By: Inez Catalina M.D.   On: 11/29/2017 08:59   Dg Chest Port 1 View  Result Date: 11/27/2017 CLINICAL DATA:  Pneumonia EXAM: PORTABLE CHEST 1 VIEW COMPARISON:  November 26, 2017 FINDINGS: There is layering pleural effusion on the left. There is consolidation in the left base. There is mild right base atelectasis. Right lung otherwise is clear. Heart is mildly enlarged with pulmonary vascularity normal. Pacemaker lead attached to right ventricle. No pneumothorax. No adenopathy. No bone lesions. IMPRESSION: Layering pleural effusion on the left with persistent consolidation left base. Mild right base atelectasis. Stable cardiac prominence. Electronically Signed   By: Lowella Grip III M.D.   On: 11/27/2017 09:27   Dg Chest Port 1 View  Result Date: 11/25/2017 CLINICAL DATA:  84 year old male with shortness of breath and pleural effusion. EXAM: PORTABLE CHEST 1 VIEW COMPARISON:  11/23/2017 CT, chest radiograph and prior studies FINDINGS: Cardiomediastinal silhouette is unchanged. A LEFT pacemaker again noted. LEFT pleural effusion and LEFT LOWER lung consolidation/atelectasis again noted. The RIGHT lung is clear. No pneumothorax or acute bony abnormality. IMPRESSION: Little significant change in appearance of the chest with continued LEFT pleural effusion and LEFT LOWER lung atelectasis/consolidation. Electronically Signed   By: Margarette Canada M.D.    On: 11/25/2017 16:53   Dg Swallowing Func-speech Pathology  Result Date: 11/23/2017 Objective Swallowing Evaluation: Type of Study: MBS-Modified Barium Swallow Study  Patient Details Name: HARVIS MABUS MRN: [id number] Date of Birth: 02-13-1933 Today's Date: 11/23/2017 Time: SLP Start Time (ACUTE ONLY): 7673 -SLP Stop Time (ACUTE ONLY): 1637 SLP Time Calculation (min) (ACUTE ONLY): 53 min Past Medical History: Past Medical History: Diagnosis Date . Asthma 1950's  history of . Atrial fibrillation ([place])  . Bilateral carpal tunnel syndrome  . Bilateral lower extremity edema  . Bladder tumor  . Chronic systolic heart failure (HCC)   Echo 1/19: Mild LVH, EF 45-50, inf HK, MAC, severe LAE, severe RAE // Echo 7/15: Mild LVH, mod focal basal sept hypertrophy, EF 55-60, AV peak and mean 16/9, trivial Oscar, mod LAE, PASP 38 . CLL (chronic lymphocytic leukemia) [facility]) oncologist-  dr Ilene Qua--  dx [phone] ;  Lymphocytosis, CLL - per lov note 05-11-2017 currently under active survillance,  CT 04-17-2014 show very small lymphadenopathy, no indication for treatment . Coronary artery disease   cardiologist-  dr Cathie Olden--  08-18-2017 Intermittant risk nuclear study w/ large area of inferior infartion with no evidence ishcemia  . Deafness in right ear  . Diabetes mellitus type 2, noninsulin dependent ([place])  . Elevated PSA   since prostatectomy but now resolved . Hematuria 04/2017 . History of ear infection   Right . History of MI (myocardial infarction)   per myoview nuclear study 08-18-2017 , unknown when . History of shingles 08/2017  L ear and scalp, possible . Hyperlipidemia  . Hypertension  . Ischemic cardiomyopathy 09/01/2017  Presumed +CAD with Nuclear stress test 08/18/17 - Inferior scar, no ischemia, intermediate risk // med management unless +angina or worse dyspnea . OA (osteoarthritis)  . Pacemaker 02/08/2014  followed by dr g. taylor--  single chamber Biotronik due to SSS . Permanent atrial fibrillation ([place])  . Pneumonia 2019   Left lung . Prostate cancer [facility]) urologist-  dr Diona Fanti  dx 2004--  Gleason 8, PSA 10.45--  11-28-2002  s/p  radical prostatectomy;  recurrent w/ increasing PSA, started ADT treatment . RBBB (right bundle branch block)  . Sick sinus syndrome ([place])   a-Flutter with episodes of bradycardia; S/P Biotronik (serial number [id number]) 02-08-2014 . Urinary incontinence  . Wears hearing aid in right ear   receiver and transmitter Past Surgical History: Past Surgical History: Procedure Laterality Date . APPENDECTOMY   . BACK SURGERY    disk . CARDIAC CATHETERIZATION  09-03-1999  dr Cathie Olden  abnormal cardiolite study:  minor luminal irregularities but no critial coronary artery stenosis . CARDIOVASCULAR STRESS TEST  08-18-2017  dr Cathie Olden  Intermediate risk nuclear study w/ large area inferior infarction, no evidence of ishcemia (consistant w/ prior MI)/  study not gated due to frequent PVCs . CARPAL TUNNEL RELEASE Right 2000 . CARPAL TUNNEL RELEASE Left 11/19/2009 . CATARACT EXTRACTION Right 07/2015 . CATARACT EXTRACTION Left 09/2015 . CYSTOSCOPY N/A 11/04/2017  Procedure: CYSTOSCOPY AND CAUTERIZATION OF BLADDER;  Surgeon: Franchot Gallo, MD;  Location: [facility];  Service: Urology;  Laterality: N/A; . INSERTION PENILE PROSTHESIS  02-22-2004    dr Mattie Marlin  [facility] . KNEE ARTHROSCOPY Left 07/2010 . PERMANENT PACEMAKER INSERTION N/A 02/08/2014  Procedure: PERMANENT PACEMAKER INSERTION;  Surgeon: Evans Lance, MD;  Location: [facility] CATH LAB;  Service: Cardiovascular;  Laterality: N/A; . RADICAL RETROPUBIC PROSTATECTOMY W/ BILATERAL PELVIC LYMPH NODE DISSECTION  11-28-2002   dr Mattie Marlin  [facility] . TONSILLECTOMY   . TOTAL HIP ARTHROPLASTY Left 05/12/2016  Procedure: LEFT TOTAL HIP ARTHROPLASTY ANTERIOR APPROACH;  Surgeon: Paralee Cancel, MD;  Location: [facility]S;  Service: Orthopedics;  Laterality: Left; . TOTAL HIP ARTHROPLASTY Right 07-15-2006   dr Alvan Dame  [name] . TRANSTHORACIC ECHOCARDIOGRAM  07/20/2017  mild LVH, ef  45-50%, hypokinesis of the basal-midinferior myocardium, due to AFib unable to evaluate diastolic function/  severe LAE and RAE/  trivial PR and TR HPI: Pt is an 84 year-old male with a history of CLL, prostate cancer, sick sinus syndrome, pacemaker, paroxysmal atrial fibrillation, CHF with ejection fraction 45% admitted with LLL PNA. Per consult note from critical care, pt/wife describe cyclic cough for over a year, with coughing sometimes escalating during meals, especially with dry foods.  Subjective: alert, denies swallowing difficulty Assessment / Plan / Recommendation CHL IP CLINICAL IMPRESSIONS 11/23/2017 Clinical Impression Pt has a mild oropharyngeal dysphagia with structural component due to suspected osteophytes most prominent at C3-4, C4-5 that impede full epiglottic deflection and airway closure. What further impacts his ability to protect his airway is  his impulsive intake (large boluses and fast rate) and premature spillage. Thin and nectar thick liquids spill into the pharynx, filling the valleculae and spilling onward to the pyriform sinuses before the swallow. Then, as he swallows, the thin liquids spills into the partially open airway, allowing for trace amounts of penetration and aspiration. Pt had significant baseline coughing that made it very challenging to determine whether coughing was related to aspiration or not. SLP provided Mod cues for attempts at a chin tuck, oral holding, and smaller bolus sizes. Small sips were the most effective at protecting the airway with thin liquids although I suspect if he could have performed the oral hold that this would further increase his safety with swallowing. Unfortunately, pt could not consistently utilize these strategies. No airway compromise occurred with thicker liquids or solids, although pt did have to take intermittent pauses during oral prep with solids to catch his breath. Although pt's dysphagia is mild, considering his impulsivity as well as  his recurrent PNA and chronic cough, would favor starting a more conservative diet at this time: Dys 3 diet and nectar thick liquids. SLP will f/u to facilitate hopeful transition back to thin liquids with improved ability to take small sips, contain liquids orally, and increase oral care/use of aspiration precautions.  SLP Visit Diagnosis Dysphagia, oropharyngeal phase (R13.12) Attention and concentration deficit following -- Frontal lobe and executive function deficit following -- Impact on safety and function Mild aspiration risk   CHL IP TREATMENT RECOMMENDATION 11/23/2017 Treatment Recommendations Therapy as outlined in treatment plan below   Prognosis 11/23/2017 Prognosis for Safe Diet Advancement Good Barriers to Reach Goals -- Barriers/Prognosis Comment -- CHL IP DIET RECOMMENDATION 11/23/2017 SLP Diet Recommendations Dysphagia 3 (Mech soft) solids;Nectar thick liquid Liquid Administration via Cup Medication Administration Whole meds with puree Compensations Slow rate;Small sips/bites Postural Changes Seated upright at 90 degrees   CHL IP OTHER RECOMMENDATIONS 11/23/2017 Recommended Consults -- Oral Care Recommendations Oral care BID Other Recommendations Order thickener from pharmacy;Prohibited food (jello, ice cream, thin soups);Remove water pitcher   CHL IP FOLLOW UP RECOMMENDATIONS 11/23/2017 Follow up Recommendations Outpatient SLP;Home health SLP   CHL IP FREQUENCY AND DURATION 11/23/2017 Speech Therapy Frequency (ACUTE ONLY) min 2x/week Treatment Duration 2 weeks      CHL IP ORAL PHASE 11/23/2017 Oral Phase Impaired Oral - Pudding Teaspoon -- Oral - Pudding Cup -- Oral - Honey Teaspoon -- Oral - Honey Cup -- Oral - Nectar Teaspoon -- Oral - Nectar Cup Decreased bolus cohesion;Premature spillage Oral - Nectar Straw -- Oral - Thin Teaspoon -- Oral - Thin Cup Decreased bolus cohesion;Premature spillage Oral - Thin Straw Decreased bolus cohesion;Premature spillage Oral - Puree Piecemeal swallowing Oral - Mech Soft  Delayed oral transit Oral - Regular -- Oral - Multi-Consistency -- Oral - Pill -- Oral Phase - Comment --  CHL IP PHARYNGEAL PHASE 11/23/2017 Pharyngeal Phase Impaired Pharyngeal- Pudding Teaspoon -- Pharyngeal -- Pharyngeal- Pudding Cup -- Pharyngeal -- Pharyngeal- Honey Teaspoon -- Pharyngeal -- Pharyngeal- Honey Cup -- Pharyngeal -- Pharyngeal- Nectar Teaspoon -- Pharyngeal -- Pharyngeal- Nectar Cup Reduced epiglottic inversion;Reduced airway/laryngeal closure;Delayed swallow initiation-pyriform sinuses Pharyngeal -- Pharyngeal- Nectar Straw -- Pharyngeal -- Pharyngeal- Thin Teaspoon -- Pharyngeal -- Pharyngeal- Thin Cup Reduced epiglottic inversion;Reduced airway/laryngeal closure;Delayed swallow initiation-pyriform sinuses;Penetration/Aspiration during swallow Pharyngeal Material enters airway, CONTACTS cords and not ejected out Pharyngeal- Thin Straw Reduced epiglottic inversion;Reduced airway/laryngeal closure;Delayed swallow initiation-pyriform sinuses;Penetration/Aspiration during swallow Pharyngeal Material enters airway, passes BELOW cords without attempt by patient to eject out (  silent aspiration) Pharyngeal- Puree Reduced epiglottic inversion;Reduced airway/laryngeal closure Pharyngeal -- Pharyngeal- Mechanical Soft Reduced epiglottic inversion;Reduced airway/laryngeal closure Pharyngeal -- Pharyngeal- Regular -- Pharyngeal -- Pharyngeal- Multi-consistency -- Pharyngeal -- Pharyngeal- Pill -- Pharyngeal -- Pharyngeal Comment --  CHL IP CERVICAL ESOPHAGEAL PHASE 11/23/2017 Cervical Esophageal Phase WFL Pudding Teaspoon -- Pudding Cup -- Honey Teaspoon -- Honey Cup -- Nectar Teaspoon -- Nectar Cup -- Nectar Straw -- Thin Teaspoon -- Thin Cup -- Thin Straw -- Puree -- Mechanical Soft -- Regular -- Multi-consistency -- Pill -- Cervical Esophageal Comment -- No flowsheet data found. Oscar Baker 11/23/2017, 5:04 PM  Oscar Baker, M.A. CCC-SLP [phone]             Ir Thoracentesis Asp Pleural Space  W/img Guide  Result Date: 11/26/2017 INDICATION: Patient with left pleural effusion. Request is made for diagnostic and therapeutic thoracentesis. EXAM: ULTRASOUND GUIDED DIAGNOSTIC AND THERAPEUTIC LEFT THORACENTESIS MEDICATIONS: 10 mL 2% lidocaine COMPLICATIONS: None immediate. PROCEDURE: An ultrasound guided thoracentesis was thoroughly discussed with the patient and questions answered. The benefits, risks, alternatives and complications were also discussed. The patient understands and wishes to proceed with the procedure. Written consent was obtained. Ultrasound was performed to localize and mark an adequate pocket of fluid in the left chest. The area was then prepped and draped in the normal sterile fashion. 2% lidocaine was used for local anesthesia. Under ultrasound guidance a Safe-T-Centesis catheter was introduced. Thoracentesis was performed. The catheter was removed and a dressing applied. FINDINGS: A total of approximately 70 mL of cloudy yellow fluid was removed. Samples were sent to the laboratory as requested by the clinical team. IMPRESSION: Successful ultrasound guided diagnostic and therapeutic left thoracentesis yielding 70 mL of pleural fluid. Read by: Brynda Greathouse PA-C Electronically Signed   By: Markus Daft M.D.   On: 11/26/2017 14:15     CBC Recent Labs  Lab 11/25/17 0532 11/26/17 0403 11/27/17 0240 11/28/17 0257 11/29/17 0211 11/29/17 2200 11/30/17 0117 11/30/17 0226 11/30/17 0634 11/30/17 1434  WBC 43.6* 37.3* 37.7* 47.4* 51.3* 61.5*  --  56.8* 43.7* 45.5*  HGB 9.4* 9.8* 10.2* 10.3* 10.3* 8.5* 9.2* 7.4* 7.2* 10.2*  HCT 27.4* 28.9* 30.2* 31.1* 31.5* 26.4* 27.0* 22.7* 21.6* 31.0*  PLT 507* 550* 631* 671* 715* 563*  --  551* 377 352  MCV 94.8 96.0 95.9 96.9 98.1 98.9  --  97.0 93.9 90.9  MCH 32.5 32.6 32.4 32.1 32.1 31.8  --  31.6 31.3 29.9  MCHC 34.3 33.9 33.8 33.1 32.7 32.2  --  32.6 33.3 32.9  RDW 18.2* 18.5* 18.6* 18.7* 18.9* 19.0*  --  18.6* 17.8* 19.1*  LYMPHSABS  22.7* 21.3* 26.8* 31.8* 34.4*  --   --   --   --   --   MONOABS 1.3* 2.6* 1.1* 1.9* 1.0  --   --   --   --   --   EOSABS 0.0 0.0 0.0 0.0 0.0  --   --   --   --   --   BASOSABS 0.0 0.0 0.0 0.0 0.0  --   --   --   --   --     Chemistries  Recent Labs  Lab 11/28/17 0257 11/28/17 1301 11/29/17 0211 11/29/17 2200 11/30/17 0117 11/30/17 0634  NA 138 135 138 134* 133* 144  K 4.5 5.3* 4.7 5.6* 6.6* 3.1*  CL 100* 97* 99* 103 99* 122*  CO2 _0 --  16*  GLUCOSE 155* 320* 102*  309* 343* 167*  BUN 28* 30* 30* 34* 49* 28*  CREATININE 1.01 1.01 0.98 1.08 1.10 0.92  CALCIUM 8.9 9.1 9.1 8.2*  --  4.9*  AST  --  22  --   --   --   --   ALT  --  20  --   --   --   --   ALKPHOS  --  77  --   --   --   --   BILITOT  --  1.0  --   --   --   --    ------------------------------------------------------------------------------------------------------------------ No results for input(s): CHOL, HDL, LDLCALC, TRIG, CHOLHDL, LDLDIRECT in the last 72 hours.  Lab Results  Component Value Date   HGBA1C 6.0 (H) 05/05/2016   ------------------------------------------------------------------------------------------------------------------ No results for input(s): TSH, T4TOTAL, T3FREE, THYROIDAB in the last 72 hours.  Invalid input(s): FREET3 ------------------------------------------------------------------------------------------------------------------ No results for input(s): VITAMINB12, FOLATE, FERRITIN, TIBC, IRON, RETICCTPCT in the last 72 hours.  Coagulation profile Recent Labs  Lab 11/27/17 0240 11/28/17 0257 11/29/17 0211 11/30/17 0226 11/30/17 0634  INR 1.44 1.38 1.33 2.06 1.40    No results for input(s): DDIMER in the last 72 hours.  Cardiac Enzymes No results for input(s): CKMB, TROPONINI, MYOGLOBIN in the last 168 hours.  Invalid input(s): CK ------------------------------------------------------------------------------------------------------------------    Component  Value Date/Time   BNP 717.8 (H) 11/20/2017 1339     Roxan Hockey M.D on 11/30/2017 at 6:00 PM  Between 7am to 7pm - Pager - [phone]  After 7pm go to [email or web address] - password [email or web address]  [facility] -  Office  [phone]   Voice Recognition Viviann Spare dictation system was used to create this note, attempts have been made to correct errors. Please contact the author with questions and/or clarifications.

## 2017-11-30 NOTE — Progress Notes (Signed)
Patient ID: NIK GORRELL, male   DOB: June 17, 1933, 84 y.o.   MRN: [id number] EVENING ROUNDS NOTE :     [address]       [place] [address]             [phone]                 Day of Surgery Procedure(s) (LRB): DRAINAGE OF HEMOTHORAX (Left) VIDEO ASSISTED THORACOSCOPY (Left)  Total Length of Stay:  LOS: 10 days  BP (!) 106/41   Pulse 62   Temp 97.6 F (36.4 C)   Resp (!) 23   Ht 5\' 10"  (1.778 m)   Wt 203 lb 0.7 oz (92.1 kg)   SpO2 96%   BMI 29.13 kg/m   .Intake/Output      06/11 0701 - 06/12 0700   I.V. (mL/kg) 1796.7 (19.5)   Blood 1856.7   IV Piggyback 80   Total Intake(mL/kg) 3733.3 (40.5)   Urine (mL/kg/hr) 485 (0.4)   Blood 250   Chest Tube 325   Total Output 1060   Net +2673.3         . cefTRIAXone (ROCEPHIN)  IV Stopped (11/30/17 1906)  . dextrose 5 % and 0.45% NaCl 75 mL/hr at 11/30/17 1900  . potassium chloride       Lab Results  Component Value Date   WBC 45.5 (H) 11/30/2017   HGB 10.2 (L) 11/30/2017   HCT 31.0 (L) 11/30/2017   PLT 352 11/30/2017   GLUCOSE 167 (H) 11/30/2017   CHOL 145 05/28/2015   TRIG 107 05/28/2015   HDL 23 (L) 05/28/2015   LDLDIRECT 133.8 07/13/2012   LDLCALC 101 05/28/2015   ALT 20 11/28/2017   AST 22 11/28/2017   NA 144 11/30/2017   K 3.1 (L) 11/30/2017   CL 122 (H) 11/30/2017   CREATININE 0.92 11/30/2017   BUN 28 (H) 11/30/2017   CO2 16 (L) 11/30/2017   TSH 0.436 07/22/2017   INR 1.40 11/30/2017   HGBA1C 6.0 (H) 05/05/2016    bp stable Not much drainage from chest tube  Grace Isaac MD  Beeper [phone] Office [phone] 11/30/2017 7:08 PM

## 2017-11-30 NOTE — Anesthesia Procedure Notes (Signed)
Procedure Name: Intubation Date/Time: 11/30/2017 10:37 AM Performed by: Roderic Palau, MD Pre-anesthesia Checklist: Patient identified, Emergency Drugs available, Suction available and Patient being monitored Patient Re-evaluated:Patient Re-evaluated prior to induction Oxygen Delivery Method: Circle system utilized Preoxygenation: Pre-oxygenation with 100% oxygen Induction Type: IV induction Ventilation: Mask ventilation without difficulty Laryngoscope Size: Mac and 4 Grade View: Grade I Tube type: Oral Tube size: 8.0 mm Number of attempts: 1 Airway Equipment and Method: Patient positioned with wedge pillow and Stylet Placement Confirmation: ETT inserted through vocal cords under direct vision,  positive ETCO2 and breath sounds checked- equal and bilateral Secured at: 22 cm Tube secured with: Tape Dental Injury: Teeth and Oropharynx as per pre-operative assessment

## 2017-11-30 NOTE — Anesthesia Preprocedure Evaluation (Addendum)
Anesthesia Evaluation  Patient identified by MRN, date of birth, ID band Patient awake    Reviewed: Allergy & Precautions, H&P , NPO status , Patient's Chart, lab work & pertinent test results  Airway Mallampati: III  TM Distance: >3 FB Neck ROM: Full    Dental no notable dental hx. (+) Teeth Intact, Dental Advisory Given   Pulmonary asthma , former smoker,    Pulmonary exam normal breath sounds clear to auscultation       Cardiovascular hypertension, + CAD and +CHF  + dysrhythmias + pacemaker  Rhythm:Regular Rate:Normal     Neuro/Psych negative neurological ROS  negative psych ROS   GI/Hepatic negative GI ROS, Neg liver ROS,   Endo/Other  diabetes, Type 2, Oral Hypoglycemic Agents  Renal/GU negative Renal ROS  negative genitourinary   Musculoskeletal  (+) Arthritis , Osteoarthritis,    Abdominal   Peds  Hematology negative hematology ROS (+) anemia , CLL   Anesthesia Other Findings   Reproductive/Obstetrics negative OB ROS                            Anesthesia Physical Anesthesia Plan  ASA: IV  Anesthesia Plan: General   Post-op Pain Management:    Induction: Intravenous  PONV Risk Score and Plan: 3 and Ondansetron and Treatment may vary due to age or medical condition  Airway Management Planned: Double Lumen EBT  Additional Equipment: Arterial line  Intra-op Plan:   Post-operative Plan: Extubation in OR and Possible Post-op intubation/ventilation  Informed Consent: I have reviewed the patients History and Physical, chart, labs and discussed the procedure including the risks, benefits and alternatives for the proposed anesthesia with the patient or authorized representative who has indicated his/her understanding and acceptance.   Dental advisory given  Plan Discussed with: CRNA  Anesthesia Plan Comments:         Anesthesia Quick Evaluation

## 2017-11-30 NOTE — Progress Notes (Signed)
Initial Nutrition Assessment  DOCUMENTATION CODES:   Not applicable  INTERVENTION:  RD to monitor PO intake post-op  NUTRITION DIAGNOSIS:   Increased nutrient needs related to acute illness as evidenced by estimated needs.  GOAL:   Patient will meet greater than or equal to 90% of their needs  MONITOR:   PO intake, Skin, Weight trends, Labs, I & O's, Diet advancement  REASON FOR ASSESSMENT:   LOS    ASSESSMENT:   84 y.o. M admitted on 11/20/17 for HCAP. PMH of CLL (under surveillance 2018, no indication for treatment 2015), hx of prostate cancer s/p radical prostatectomy 2004, bladder tumor, OA, hematuria, CHF, a fib, CAD, ischemic cardiomyopathy, pacemaker, and T2DM. 6/10 pt had thoracoscopy to drain empyema and decortication. 6/11 pt had drainage of hemothorax and thoracoscopy.   Pt eating 100% of meals before NPO initiation 6/10.  Pt was on D3 nectar liquids since 6/4 - eating 100%. Pt weight stable since 03/2017; weight stable in hospital as well with some discrepencies, bed weight today 215 lbs, bed weight 6/10 203 lbs.   Pt asleep and unable to rouse with family at bedside. Wife and best friend report pt was eating well PTA, and that he has always been a big eater. They are concerned that he will not like his new D3 nectar thick diet and asked for some helpful tips. Disscussed with pt family helpful tips for D3 diet including adding gravy to foods to make it more palatable and easier to swallow. Family reports that he loves gravy, but would it be an issue to have mashed potatoes due to his diabetes. Discussed MyPlate with pt family and discussed that he still could have his mashed potatoes.  Wife reports a while ago pt used to be 230 lbs, but in January he was around 210 lbs; pt chart shows pt weight stable between 200-216 lbs since October 2018. Family reports no recent weight loss or loss of appetite.  Medications reviewed: novolog 0-24 units, solu-medrol, reglan, senokot,  D5-0.45% NaCl solution, fentanyl, K+Cl ordered - not given yet.   Labs reviewed: K+ 3.1 (L), Cl 122 (H), CO2 16 (L), BG 167 (H), BUN 28 (H), WBC 45.5 (H), RBC 3.41 (L), hemoglobin 10.2 (L), HCT 31 (L).   NUTRITION - FOCUSED PHYSICAL EXAM:  Suspect depletions due to normal age-wasting sarcopenia.     Most Recent Value  Orbital Region  No depletion  Upper Arm Region  No depletion  Thoracic and Lumbar Region  No depletion  Buccal Region  No depletion  Temple Region  Mild depletion  Clavicle Bone Region  Mild depletion  Clavicle and Acromion Bone Region  Mild depletion  Scapular Bone Region  Unable to assess  Dorsal Hand  No depletion  Patellar Region  No depletion  Anterior Thigh Region  Mild depletion  Posterior Calf Region  No depletion  Edema (RD Assessment)  None  Hair  Reviewed  Eyes  Unable to assess  Mouth  Unable to assess  Skin  Reviewed  Nails  Reviewed       Diet Order:   Diet Order           Diet NPO time specified  Diet effective now          EDUCATION NEEDS:   Not appropriate for education at this time  Skin:  Skin Assessment: Skin Integrity Issues: Skin Integrity Issues:: Incisions Incisions: L chest  Last BM:  11/29/17  Height:   Ht Readings from Last 1  Encounters:  11/20/17 5\' 10"  (1.778 m)    Weight:   Wt Readings from Last 1 Encounters:  11/30/17 203 lb 0.7 oz (92.1 kg)    Ideal Body Weight:  75.45 kg  BMI:  Body mass index is 29.13 kg/m.  Estimated Nutritional Needs:   Kcal:  1950-2150 kcal  Protein:  100-115 g  Fluid:  >/= 2 L or per MD   Hope Budds, Dietetic Intern

## 2017-11-30 NOTE — Progress Notes (Signed)
Oscar Baker had a left VATS for drainage of empyema and decortication yesterday.  After arrival to [facility] he was noted to be hypotensive with low UO. No central access. Paced rhythm at 60 bpm. Findings c/w hypovolemic/ hemorrhagic shock. CXR showed a left hemothorax. Anemic with a HCT of 22. Volume resuscitation initiated but limited IV access. Central line placed. Confirmed hypovolemic.   Receiving 3rd unit of PRBC currently with BP 119/55. 4 units of FFP ordered for coagulopathy with INR of 2.   Awake and oriented.  On follow up CXR after central line placement the hemothorax appears a little less dense. I do not think trying to place a blind chest tube will help matters. His tubes are in good position and I suspect the collection is mostly clotted.  Revonda Standard Roxan Hockey, MD [facility] [phone]

## 2017-11-30 NOTE — Progress Notes (Signed)
Dr. Roxan Hockey paged and made aware of pts large drop in BP. Stat chest x ray pending and orders received for 1 prbc. Will continue to monitor closely. Eleonore Chiquito RN 2 Heart CVICU

## 2017-11-30 NOTE — Progress Notes (Signed)
1 Day Post-Op Procedure(s) (LRB): VIDEO ASSISTED THORACOSCOPY (VATS)/EMPYEMA (Left) Subjective: Events noted and CXR reviewed Patient with CLL , empyema with delayed bleeding after   L VATS. Will need re-exploration to remove hematoma Family notified. Objective: Vital signs in last 24 hours: Temp:  [95.8 F (35.4 C)-98.2 F (36.8 C)] 98.2 F (36.8 C) (06/11 0757) Pulse Rate:  [49-101] 59 (06/11 0730) Cardiac Rhythm: Ventricular paced (06/11 0400) Resp:  [18-32] 21 (06/11 0730) BP: (49-144)/(16-85) 117/59 (06/11 0730) SpO2:  [84 %-100 %] 99 % (06/11 0730) Arterial Line BP: (97-149)/(36-63) 98/37 (06/10 2130)  Hemodynamic parameters for last 24 hours: CVP:  [5 mmHg-10 mmHg] 5 mmHg  Intake/Output from previous day: 06/10 0701 - 06/11 0700 In: 5344.3 [I.V.:2258.2; Blood:1886.1; IV Piggyback:1200] Out: 1105 [Urine:270; Blood:75; Chest Tube:760] Intake/Output this shift: No intake/output data recorded.  Decreased breath sounds on L  Lab Results: Recent Labs    11/30/17 0226 11/30/17 0634  WBC 56.8* 43.7*  HGB 7.4* 7.2*  HCT 22.7* 21.6*  PLT 551* 377   BMET:  Recent Labs    11/29/17 2200 11/30/17 0117 11/30/17 0634  NA 134* 133* 144  K 5.6* 6.6* 3.1*  CL 103 99* 122*  CO2 23  --  16*  GLUCOSE 309* 343* 167*  BUN 34* 49* 28*  CREATININE 1.08 1.10 0.92  CALCIUM 8.2*  --  4.9*    PT/INR:  Recent Labs    11/30/17 0634  LABPROT 17.1*  INR 1.40   ABG    Component Value Date/Time   PHART 7.400 11/30/2017 0147   HCO3 21.5 11/30/2017 0147   TCO2 23 11/30/2017 0147   ACIDBASEDEF 3.0 (H) 11/30/2017 0147   O2SAT 99.0 11/30/2017 0147   CBG (last 3)  Recent Labs    11/30/17 0000 11/30/17 0413 11/30/17 0755  GLUCAP 300* 327* 222*    Assessment/Plan: S/P Procedure(s) (LRB): VIDEO ASSISTED THORACOSCOPY (VATS)/EMPYEMA (Left) Return to OR this AM   LOS: 10 days    Tharon Aquas Trigt III 11/30/2017

## 2017-11-30 NOTE — Transfer of Care (Signed)
Immediate Anesthesia Transfer of Care Note  Patient: Oscar Baker  Procedure(s) Performed: DRAINAGE OF HEMOTHORAX (Left Chest) VIDEO ASSISTED THORACOSCOPY (Left Chest)  Patient Location: PACU  Anesthesia Type:General  Level of Consciousness: awake and drowsy  Airway & Oxygen Therapy: Patient Spontanous Breathing and Patient connected to face mask oxygen  Post-op Assessment: Report given to RN, Post -op Vital signs reviewed and stable and Patient moving all extremities  Post vital signs: Reviewed and stable  Last Vitals:  Vitals Value Taken Time  BP 131/66 11/30/2017  1:00 PM  Temp 36.2 C 11/30/2017  1:00 PM  Pulse    Resp 28 11/30/2017  1:05 PM  SpO2 99 % 11/30/2017  1:00 PM  Vitals shown include unvalidated device data.  Last Pain:  Vitals:   11/30/17 0845  TempSrc: Oral  PainSc:       Patients Stated Pain Goal: 3 (99/35/70 1779)  Complications: No apparent anesthesia complications

## 2017-12-01 ENCOUNTER — Inpatient Hospital Stay (HOSPITAL_COMMUNITY): Payer: Medicare Other

## 2017-12-01 ENCOUNTER — Ambulatory Visit: Payer: Medicare Other | Admitting: Physical Therapy

## 2017-12-01 ENCOUNTER — Encounter: Payer: Self-pay | Admitting: Physical Therapy

## 2017-12-01 ENCOUNTER — Encounter (HOSPITAL_COMMUNITY): Payer: Self-pay | Admitting: Cardiothoracic Surgery

## 2017-12-01 LAB — PREPARE FRESH FROZEN PLASMA
UNIT DIVISION: 0
Unit division: 0
Unit division: 0
Unit division: 0
Unit division: 0
Unit division: 0

## 2017-12-01 LAB — BPAM FFP
BLOOD PRODUCT EXPIRATION DATE: 201906152359
BLOOD PRODUCT EXPIRATION DATE: 201906162359
Blood Product Expiration Date: 201906152359
Blood Product Expiration Date: 201906162359
Blood Product Expiration Date: 201906152359
Blood Product Expiration Date: 201906152359
Blood Product Expiration Date: 201906162359
Blood Product Expiration Date: 201906162359
ISSUE DATE / TIME: 201906110347
ISSUE DATE / TIME: 201906110347
ISSUE DATE / TIME: 201906110450
ISSUE DATE / TIME: 201906110450
ISSUE DATE / TIME: 201906111014
ISSUE DATE / TIME: 201906111014
ISSUE DATE / TIME: 201906111509
UNIT TYPE AND RH: 5100
Unit Type and Rh: 5100
Unit Type and Rh: 5100
Unit Type and Rh: 5100
Unit Type and Rh: 5100
Unit Type and Rh: 6200
Unit Type and Rh: 6200
Unit Type and Rh: 9500

## 2017-12-01 LAB — BASIC METABOLIC PANEL
ANION GAP: 5 (ref 5–15)
Anion gap: 7 (ref 5–15)
BUN: 34 mg/dL — ABNORMAL HIGH (ref 6–20)
BUN: 27 mg/dL — ABNORMAL HIGH (ref 6–20)
CHLORIDE: 107 mmol/L (ref 101–111)
CO2: 27 mmol/L (ref 22–32)
CO2: 27 mmol/L (ref 22–32)
Calcium: 7.7 mg/dL — ABNORMAL LOW (ref 8.9–10.3)
Calcium: 7.9 mg/dL — ABNORMAL LOW (ref 8.9–10.3)
Chloride: 102 mmol/L (ref 101–111)
Creatinine, Ser: 1.21 mg/dL (ref 0.61–1.24)
Creatinine, Ser: 1.02 mg/dL (ref 0.61–1.24)
GFR calc Af Amer: >60 mL/min (ref >60)
GFR calc Af Amer: >60 mL/min (ref >60)
GFR calc non Af Amer: >60 mL/min (ref >60)
GFR, EST NON AFRICAN AMERICAN: 54 mL/min — AB (ref >60)
Glucose, Bld: 129 mg/dL — ABNORMAL HIGH (ref 65–99)
Glucose, Bld: 334 mg/dL — ABNORMAL HIGH (ref 65–99)
POTASSIUM: 3.9 mmol/L (ref 3.5–5.1)
Potassium: 6.2 mmol/L — ABNORMAL HIGH (ref 3.5–5.1)
SODIUM: 139 mmol/L (ref 135–145)
Sodium: 136 mmol/L (ref 135–145)

## 2017-12-01 LAB — CBC
HCT: 25.5 % — ABNORMAL LOW (ref 39.0–52.0)
HEMOGLOBIN: 8.5 g/dL — AB (ref 13.0–17.0)
MCH: 30.2 pg (ref 26.0–34.0)
MCHC: 33.3 g/dL (ref 30.0–36.0)
MCV: 90.7 fL (ref 78.0–100.0)
PLATELETS: 333 10*3/uL (ref 150–400)
RBC: 2.81 MIL/uL — AB (ref 4.22–5.81)
RDW: 18.9 % — ABNORMAL HIGH (ref 11.5–15.5)
WBC: 30.1 10*3/uL — AB (ref 4.0–10.5)

## 2017-12-01 LAB — GLUCOSE, CAPILLARY
GLUCOSE-CAPILLARY: 139 mg/dL — AB (ref 65–99)
GLUCOSE-CAPILLARY: 297 mg/dL — AB (ref 65–99)
Glucose-Capillary: 130 mg/dL — ABNORMAL HIGH (ref 65–99)
Glucose-Capillary: 141 mg/dL — ABNORMAL HIGH (ref 65–99)
Glucose-Capillary: 341 mg/dL — ABNORMAL HIGH (ref 65–99)
Glucose-Capillary: 333 mg/dL — ABNORMAL HIGH (ref 65–99)

## 2017-12-01 LAB — PREPARE PLATELET PHERESIS: Unit division: 0

## 2017-12-01 LAB — BLOOD GAS, ARTERIAL
ACID-BASE EXCESS: 2.5 mmol/L — AB (ref 0.0–2.0)
Bicarbonate: 26.7 mmol/L (ref 20.0–28.0)
DRAWN BY: 249101
O2 CONTENT: 3 L/min
O2 SAT: 94.7 %
Patient temperature: 97.7
pCO2 arterial: 41.7 mmHg (ref 32.0–48.0)
pH, Arterial: 7.42 (ref 7.350–7.450)
pO2, Arterial: 69.8 mmHg — ABNORMAL LOW (ref 83.0–108.0)

## 2017-12-01 LAB — POCT I-STAT 7, (LYTES, BLD GAS, ICA,H+H)
Bicarbonate: 26.6 mmol/L (ref 20.0–28.0)
Calcium, Ion: 1.18 mmol/L (ref 1.15–1.40)
HCT: 27 % — ABNORMAL LOW (ref 39.0–52.0)
HEMOGLOBIN: 9.2 g/dL — AB (ref 13.0–17.0)
O2 Saturation: 100 %
PCO2 ART: 53.1 mmHg — AB (ref 32.0–48.0)
PO2 ART: 272 mmHg — AB (ref 83.0–108.0)
Potassium: 4.8 mmol/L (ref 3.5–5.1)
SODIUM: 136 mmol/L (ref 135–145)
TCO2: 28 mmol/L (ref 22–32)
pH, Arterial: 7.303 — ABNORMAL LOW (ref 7.350–7.450)

## 2017-12-01 LAB — ACID FAST SMEAR (AFB, MYCOBACTERIA): Acid Fast Smear: NEGATIVE

## 2017-12-01 LAB — POCT I-STAT 3, ART BLOOD GAS (G3+)
ACID-BASE EXCESS: 4 mmol/L — AB (ref 0.0–2.0)
BICARBONATE: 28.1 mmol/L — AB (ref 20.0–28.0)
O2 SAT: 92 %
PO2 ART: 58 mmHg — AB (ref 83.0–108.0)
Patient temperature: 97.7
TCO2: 29 mmol/L (ref 22–32)
pCO2 arterial: 40.3 mmHg (ref 32.0–48.0)
pH, Arterial: 7.449 (ref 7.350–7.450)

## 2017-12-01 LAB — BPAM PLATELET PHERESIS
Blood Product Expiration Date: 201906122359
ISSUE DATE / TIME: 201906111440
Unit Type and Rh: 6200

## 2017-12-01 MED ORDER — CHLORHEXIDINE GLUCONATE CLOTH 2 % EX PADS
6.0000 | MEDICATED_PAD | Freq: Every day | CUTANEOUS | Status: DC
  Administered 2017-12-01 – 2017-12-02 (×2): 6 via TOPICAL

## 2017-12-01 MED ORDER — LEVALBUTEROL HCL 0.63 MG/3ML IN NEBU
0.6300 mg | INHALATION_SOLUTION | Freq: Three times a day (TID) | RESPIRATORY_TRACT | Status: DC
  Administered 2017-12-01: 0.63 mg via RESPIRATORY_TRACT
  Filled 2017-12-01: qty 3

## 2017-12-01 MED ORDER — INSULIN ASPART 100 UNIT/ML ~~LOC~~ SOLN
0.0000 [IU] | Freq: Every day | SUBCUTANEOUS | Status: DC
  Administered 2017-12-01: 3 [IU] via SUBCUTANEOUS
  Administered 2017-12-02 – 2017-12-03 (×2): 2 [IU] via SUBCUTANEOUS
  Administered 2017-12-04: 3 [IU] via SUBCUTANEOUS

## 2017-12-01 MED ORDER — WHITE PETROLATUM EX OINT
TOPICAL_OINTMENT | CUTANEOUS | Status: AC
  Administered 2017-12-01: 09:00:00
  Filled 2017-12-01: qty 28.35

## 2017-12-01 MED ORDER — METHYLPREDNISOLONE SODIUM SUCC 40 MG IJ SOLR
20.0000 mg | Freq: Every day | INTRAMUSCULAR | Status: DC
  Administered 2017-12-02: 20 mg via INTRAVENOUS
  Filled 2017-12-01: qty 1

## 2017-12-01 MED ORDER — FENTANYL CITRATE (PF) 100 MCG/2ML IJ SOLN: 12.5000 ug | Freq: Four times a day (QID) | INTRAMUSCULAR | Status: DC | PRN

## 2017-12-01 MED ORDER — GUAIFENESIN ER 600 MG PO TB12
600.0000 mg | ORAL_TABLET | Freq: Two times a day (BID) | ORAL | Status: DC
  Administered 2017-12-01 – 2017-12-05 (×9): 600 mg via ORAL
  Filled 2017-12-01 (×9): qty 1

## 2017-12-01 MED ORDER — INSULIN ASPART 100 UNIT/ML ~~LOC~~ SOLN
0.0000 [IU] | Freq: Three times a day (TID) | SUBCUTANEOUS | Status: DC
  Administered 2017-12-01 (×2): 11 [IU] via SUBCUTANEOUS
  Administered 2017-12-02: 3 [IU] via SUBCUTANEOUS
  Administered 2017-12-02 (×2): 11 [IU] via SUBCUTANEOUS
  Administered 2017-12-03: 8 [IU] via SUBCUTANEOUS
  Administered 2017-12-03: 5 [IU] via SUBCUTANEOUS
  Administered 2017-12-03: 8 [IU] via SUBCUTANEOUS
  Administered 2017-12-04: 3 [IU] via SUBCUTANEOUS
  Administered 2017-12-04: 5 [IU] via SUBCUTANEOUS
  Administered 2017-12-04 – 2017-12-05 (×2): 2 [IU] via SUBCUTANEOUS
  Administered 2017-12-05: 3 [IU] via SUBCUTANEOUS

## 2017-12-01 MED ORDER — FUROSEMIDE 10 MG/ML IJ SOLN
20.0000 mg | Freq: Two times a day (BID) | INTRAMUSCULAR | Status: DC
  Administered 2017-12-01 – 2017-12-02 (×5): 20 mg via INTRAVENOUS
  Filled 2017-12-01 (×5): qty 2

## 2017-12-01 MED ORDER — LEVALBUTEROL HCL 0.63 MG/3ML IN NEBU
0.6300 mg | INHALATION_SOLUTION | Freq: Four times a day (QID) | RESPIRATORY_TRACT | Status: DC
  Administered 2017-12-01 – 2017-12-02 (×3): 0.63 mg via RESPIRATORY_TRACT
  Filled 2017-12-01 (×3): qty 3

## 2017-12-01 MED ORDER — SODIUM CHLORIDE 0.9% FLUSH: 10.0000 mL | INTRAVENOUS | Status: DC | PRN

## 2017-12-01 MED ORDER — OXYCODONE HCL 5 MG PO TABS
5.0000 mg | ORAL_TABLET | Freq: Four times a day (QID) | ORAL | Status: DC | PRN
  Administered 2017-12-02 – 2017-12-04 (×2): 5 mg via ORAL
  Filled 2017-12-01 (×2): qty 1

## 2017-12-01 MED ORDER — SODIUM CHLORIDE 0.9% FLUSH
10.0000 mL | Freq: Two times a day (BID) | INTRAVENOUS | Status: DC
  Administered 2017-12-01: 20 mL
  Administered 2017-12-01 – 2017-12-04 (×5): 10 mL
  Administered 2017-12-04 – 2017-12-05 (×2): 30 mL

## 2017-12-01 NOTE — Therapy (Signed)
[facility] [place] [facility] [facility] [address] [place], [name], [address] Phone: [phone]   Fax:  [phone]  Patient Details  Name: Oscar Baker MRN: [id number] Date of Birth: February 12, 1933 Referring Provider:  [name], MD  Encounter Date: 12/01/2017  PHYSICAL THERAPY DISCHARGE SUMMARY  Visits from Start of Care: 9  Last seen for outpatient PT appointment on 11/18/2017  Current functional level related to goals / functional outcomes: Patient was making progress towards goals but was hospitalized with pneumonia on 11/20/2017. As of today, 12/01/2017, patient is still hospitalized. PT will discharge due to medical change. Please refer back to PT once patient is discharged from hospital and medically stable.    Remaining deficits: See above   Education / Equipment: Initiated HEP  Plan: Patient agrees to discharge.  Patient goals were not met. Patient is being discharged due to a change in medical status.  ?????         Yekaterina Escutia PT, DPT 12/01/2017, 8:09 AM  [facility] [address] [place], [name], [address] Phone: [phone]   Fax:  [phone]

## 2017-12-01 NOTE — Progress Notes (Signed)
[facility] DAILY ICU PROGRESS NOTE                   [address]            [place] [address]          [phone]   1 Day Post-Op Procedure(s) (LRB): DRAINAGE OF HEMOTHORAX (Left) VIDEO ASSISTED THORACOSCOPY (Left)  Total Length of Stay:  LOS: 11 days   Subjective: Feels okay this morning. He isn't having much pain.   Objective: Vital signs in last 24 hours: Temp:  [97.2 F (36.2 C)-98.6 F (37 C)] 97.8 F (36.6 C) (06/12 0754) Pulse Rate:  [59-90] 63 (06/12 0700) Cardiac Rhythm: Ventricular paced (06/12 0400) Resp:  [17-30] 22 (06/12 0700) BP: (82-135)/(35-71) 106/49 (06/12 0700) SpO2:  [91 %-100 %] 97 % (06/12 0700) Arterial Line BP: (83-189)/(37-77) 93/44 (06/12 0700) Weight:  [203 lb 0.7 oz (92.1 kg)-210 lb 9.6 oz (95.5 kg)] 210 lb 9.6 oz (95.5 kg) (06/12 0600)  Filed Weights   11/21/17 0537 11/30/17 1525 12/01/17 0600  Weight: 138 lb 12.8 oz (63 kg) 203 lb 0.7 oz (92.1 kg) 210 lb 9.6 oz (95.5 kg)    Weight change:    Hemodynamic parameters for last 24 hours: CVP:  [1 mmHg-5 mmHg] 2 mmHg  Intake/Output from previous day: 06/11 0701 - 06/12 0700 In: 4558.3 [I.V.:2621.7; Blood:1856.7; IV Piggyback:80] Out: 2195 [Urine:1410; Blood:250; Chest Tube:535]  Intake/Output this shift: No intake/output data recorded.  Current Meds: Scheduled Meds: . white petrolatum      . acetaminophen  1,000 mg Oral Q6H   Or  . acetaminophen (TYLENOL) oral liquid 160 mg/5 mL  1,000 mg Oral Q6H  . atorvastatin  20 mg Oral q1800  . bisacodyl  10 mg Oral Daily  . furosemide  20 mg Intravenous BID  . insulin aspart  0-24 Units Subcutaneous Q4H  . methylPREDNISolone (SOLU-MEDROL) injection  40 mg Intravenous Daily  . metoCLOPramide (REGLAN) injection  10 mg Intravenous Q6H  . senna-docusate  1 tablet Oral QHS   Continuous Infusions: . cefTRIAXone (ROCEPHIN)  IV Stopped (11/30/17 1906)  . dextrose 5 % and 0.45% NaCl 10 mL/hr at 12/01/17 0815  . potassium chloride      PRN Meds:.fentaNYL (SUBLIMAZE) injection, ondansetron (ZOFRAN) IV, oxyCODONE, potassium chloride, traMADol  General appearance: alert, cooperative and no distress Heart: regular rate and rhythm, S1, S2 normal, no murmur, click, rub or gallop Lungs: diffuse rhonchi in all fields.  Abdomen: soft, non-tender; bowel sounds normal; no masses,  no organomegaly Extremities: extremities normal, atraumatic, no cyanosis or edema Wound: clean and dry, dressed with a sterile dressing  Lab Results: CBC: Recent Labs    11/30/17 2242 12/01/17 0430  WBC 32.5* 30.1*  HGB 8.8* 8.5*  HCT 26.3* 25.5*  PLT 353 333   BMET:  Recent Labs    11/30/17 2242 12/01/17 0430  NA 137 139  K 3.8 3.9  CL 103 107  CO2 26 27  GLUCOSE 168* 129*  BUN 37* 34*  CREATININE 1.37* 1.21  CALCIUM 7.8* 7.7*    CMET: Lab Results  Component Value Date   WBC 30.1 (H) 12/01/2017   HGB 8.5 (L) 12/01/2017   HCT 25.5 (L) 12/01/2017   PLT 333 12/01/2017   GLUCOSE 129 (H) 12/01/2017   CHOL 145 05/28/2015   TRIG 107 05/28/2015   HDL 23 (L) 05/28/2015   LDLDIRECT 133.8 07/13/2012   LDLCALC 101 05/28/2015   ALT 20 11/28/2017  AST 22 11/28/2017   NA 139 12/01/2017   K 3.9 12/01/2017   CL 107 12/01/2017   CREATININE 1.21 12/01/2017   BUN 34 (H) 12/01/2017   CO2 27 12/01/2017   TSH 0.436 07/22/2017   INR 1.40 11/30/2017   HGBA1C 6.0 (H) 05/05/2016      PT/INR:  Recent Labs    11/30/17 0634  LABPROT 17.1*  INR 1.40   Radiology: Dg Chest Port 1 View  Result Date: 11/30/2017 CLINICAL DATA:  Status post thoracotomy. History of leukemia, atrial fibrillation. EXAM: PORTABLE CHEST 1 VIEW COMPARISON:  Chest radiograph November 30, 2017 FINDINGS: Stable cardiomegaly. Two LEFT apical chest tubes in place. LEFT apical pleural thickening. No pneumothorax. RIGHT subclavian central venous catheter distal tip projects in distal superior vena cava. Bibasilar strandy densities. Single lead LEFT cardiac pacemaker. Similar  LEFT chest wall subcutaneous gas. IMPRESSION: Stable life-support lines including 2 LEFT apical chest tubes. LEFT apical pleural thickening and bibasilar atelectasis. No pneumothorax. Stable cardiomegaly. Electronically Signed   By: Elon Alas M.D.   On: 11/30/2017 14:25   Dg Chest Port 1 View  Result Date: 11/30/2017 CLINICAL DATA:  Status post VATS on the left EXAM: PORTABLE CHEST 1 VIEW COMPARISON:  11/30/2017 FINDINGS: Pacemaker is again identified and stable. Right central venous line is noted and stable. Two chest tubes are seen on the left. Significant improved aeration in the left is noted when compared with the prior exam. Degenerative changes of the thoracic spine are noted. IMPRESSION: Significant improved aeration on the left. Electronically Signed   By: Inez Catalina M.D.   On: 11/30/2017 13:50     Assessment/Plan: S/P Procedure(s) (LRB): DRAINAGE OF HEMOTHORAX (Left) VIDEO ASSISTED THORACOSCOPY (Left)  1. CV-NSR in the 60s. BP well controlled. Continue Lipitor.  2. Pulm-No CXR today, will order. S/p left VATs with drainage of an empyema with take back on 6/11 for drainage of a hemothorax. Chest tubes put out 549ml/24 hours-keep.  3. Renal-creatinine 1.21, decreasing. Electrolytes okay.  4. H and H 8.5/25.5 this morning. Platelets 333k 5. Endo-blood glucose with moderate control-improved over the last 24 hours. On three oral agents at home. Continue SSI for now. 6. ID- WBC 30.1, afebrile. On Rocephin.   Plan: Continue aggressive pulmonary toilet with IS and flutter valve. MetaNebs with RT and mucinex for mobilizing secretions. Ambulate TID. Keep chest tubes for now.     Elgie Collard 12/01/2017 9:04 AM

## 2017-12-01 NOTE — Progress Notes (Signed)
Patient ID: Oscar Baker, male   DOB: 01-Apr-1933, 84 y.o.   MRN: [id number] EVENING ROUNDS NOTE :     [address]       [place], [address]             [phone]                 1 Day Post-Op Procedure(s) (LRB): DRAINAGE OF HEMOTHORAX (Left) VIDEO ASSISTED THORACOSCOPY (Left)  Total Length of Stay:  LOS: 11 days  BP (!) 113/58   Pulse 70   Temp 98 F (36.7 C) (Oral)   Resp (!) 21   Ht 5\' 10"  (1.778 m)   Wt 210 lb 9.6 oz (95.5 kg)   SpO2 95%   BMI 30.22 kg/m   .Intake/Output      06/12 0701 - 06/13 0700   P.O. 800   I.V. (mL/kg) 276.3 (2.9)   Blood    IV Piggyback 273.3   Total Intake(mL/kg) 1349.6 (14.1)   Urine (mL/kg/hr) 1700 (1.5)   Blood    Chest Tube 160   Total Output 1860   Net -510.4       Urine Occurrence 1 x     . cefTRIAXone (ROCEPHIN)  IV 2 g (12/01/17 1744)  . dextrose 5 % and 0.45% NaCl 10 mL/hr at 12/01/17 0815  . potassium chloride       Lab Results  Component Value Date   WBC 30.1 (H) 12/01/2017   HGB 8.5 (L) 12/01/2017   HCT 25.5 (L) 12/01/2017   PLT 333 12/01/2017   GLUCOSE 129 (H) 12/01/2017   CHOL 145 05/28/2015   TRIG 107 05/28/2015   HDL 23 (L) 05/28/2015   LDLDIRECT 133.8 07/13/2012   LDLCALC 101 05/28/2015   ALT 20 11/28/2017   AST 22 11/28/2017   NA 139 12/01/2017   K 3.9 12/01/2017   CL 107 12/01/2017   CREATININE 1.21 12/01/2017   BUN 34 (H) 12/01/2017   CO2 27 12/01/2017   TSH 0.436 07/22/2017   INR 1.40 11/30/2017   HGBA1C 6.0 (H) 05/05/2016   20 mg lasix x 2 , 1860 uop walked around unit with walker More alert tonight   Grace Isaac MD  Beeper [phone] Office [phone] 12/01/2017 7:07 PM

## 2017-12-01 NOTE — Progress Notes (Signed)
Patient Demographics:    Oscar [place], is a 84 y.o. male, DOB - 1933-03-22, PYP:[id number]  Admit date - 11/20/2017   Admitting Physician Jani Gravel, MD  Outpatient Primary MD for the patient is [name], MD  LOS - 11   Chief Complaint  Patient presents with  . Cough  . Fever        Subjective:    Oscar [place] is awake and talkative,, patient's wife and fellow Mason's/Freinds at bedside,  questions answered  Assessment  & Plan :    Principal Problem:   HCAP (healthcare-associated pneumonia) Active Problems:   HTN (hypertension)   Hyperlipidemia   Diabetes mellitus type 2, noninsulin dependent (HCC)   LPRD (laryngopharyngeal reflux disease)   Other allergic rhinitis   Anemia   Chronic cough   CLL (chronic lymphocytic leukemia) (HCC)   Pleural effusion   Pneumonia of left lower lobe due to infectious organism ([place])   Mediastinal lymphadenopathy   Hypoxemia   Sepsis ([place])   S/P thoracotomy               Brief Narrative:     84 year-old male with a history of CLL, prostate cancer, sick sinus syndrome, pacemaker, paroxysmal atrial fibrillation, CHF with ejection fraction 45% admitted with fever increasing shortness of breath cough with yellow to green phlegm for 1 week prior to admission. He was found to have left lower lobe pneumonia with associated effusion and is admitted for the treatment of the same.  Thoracentesis was attempted on 11/25/2017 by pulmonology service but had to be aborted due to coughing.  IR did thoracentesis on 11/26/2017 with only 70 cc fluid obtained.  CT surgery consulted , for VIDEO ASSISTED THORACOSCOPY (VATS)/EMPYEMA (Left) on 11/29/17 due to persistent pleural effusion despite 2 failed attempts at thoracentesis with minimal fluid drained.    Plan:- 1)HCAP/Haemophilus influenza Bacteremia complicated with loculated Lt Pleural Effusion, CT surgery consulted , had Lt  sided VATS  on 11/29/17, developed significant hemothorax post VATS, went back to OR for exploration and drainage of hemothorax on 11/30/17, 32 F chest tube placed, left-sided chest tubes appear to be functioning as expected, continue IV Rocephin (started 11/21/17),  repeat blood cultures negative so far, persistent leukocytosis may be secondary to CLL and steroids.  Pleural fluid cultures remains negative so far  2)PAFib-Coumadin on hold since 11/27/2017 to allow for VATS, continue Coreg for rate control,  3)HFrEF-last known EF about 45 to 50% from January 2019, continue Lasix 20 mg daily, continue Coreg  4)CLL--persistent leukocytosis mostly secondary to underlying CLL  5)DM-glycemic control worsened with steroids, continue Lantus 20 units, Use Novolog/Humalog Sliding scale insulin with Accu-Cheks/Fingersticks as ordered   6)Acute Blood Loss Anemia due to large left-sided hemothorax in the setting of VAT procedure, received 3 units of packed cells on 11/29/2017, received 4 units of FFP on 11/29/2017, also received 2 units of packed cells on 11/30/2017, received 1 unit of FFP on 11/30/2017, and then received one platelet transfusion on 11/30/2017, posttransfusion hemoglobin is down to 8.5, however clinically patient looks hemodynamically stable  7)Social/Ethics/Code Status : I have discussed the patient's wishes with the patient's wife Ms Oscar [place]  and pt's Brother Oscar [place] at bedside -- Pt is a FULL  CODE, they Desire/Request  life sustaining measures be used and that artificial means of life support  be employed including cardioversion, mechanical ventilation, chest compressions, pressors, and CPR as indicated  Disposition Plan  : TBD  Consults  :  CT surgery, PCCM/IR  Procedures 11/29/17--- Lt VATS 11/29/17--- right subclavian central line placement 11/30/17--evacuation of Lt hemothorax   DVT Prophylaxis  : Coumadin on hold to allow for VATs  Lab Results  Component Value Date   PLT 333  12/01/2017    Inpatient Medications  Scheduled Meds: . acetaminophen  1,000 mg Oral Q6H   Or  . acetaminophen (TYLENOL) oral liquid 160 mg/5 mL  1,000 mg Oral Q6H  . atorvastatin  20 mg Oral q1800  . bisacodyl  10 mg Oral Daily  . Chlorhexidine Gluconate Cloth  6 each Topical Daily  . furosemide  20 mg Intravenous BID  . guaiFENesin  600 mg Oral BID  . insulin aspart  0-15 Units Subcutaneous TID WC  . insulin aspart  0-5 Units Subcutaneous QHS  . levalbuterol  0.63 mg Nebulization QID  . [START ON 12/02/2017] methylPREDNISolone (SOLU-MEDROL) injection  20 mg Intravenous Daily  . metoCLOPramide (REGLAN) injection  10 mg Intravenous Q6H  . senna-docusate  1 tablet Oral QHS  . sodium chloride flush  10-40 mL Intracatheter Q12H   Continuous Infusions: . cefTRIAXone (ROCEPHIN)  IV 2 g (12/01/17 1744)  . dextrose 5 % and 0.45% NaCl 10 mL/hr at 12/01/17 0815  . potassium chloride     PRN Meds:.fentaNYL (SUBLIMAZE) injection, ondansetron (ZOFRAN) IV, oxyCODONE, potassium chloride, sodium chloride flush, traMADol    Anti-infectives (From admission, onward)   Start     Dose/Rate Route Frequency Ordered Stop   11/30/17 1830  cefTRIAXone (ROCEPHIN) 2 g in sodium chloride 0.9 % 100 mL IVPB     2 g 200 mL/hr over 30 Minutes Intravenous Every 24 hours 11/30/17 1811     11/29/17 2200  vancomycin (VANCOCIN) 500 mg in sodium chloride 0.9 % 100 mL IVPB  Status:  Discontinued     500 mg 100 mL/hr over 60 Minutes Intravenous Every 12 hours 11/29/17 2157 11/30/17 1357   11/29/17 2200  piperacillin-tazobactam (ZOSYN) IVPB 3.375 g  Status:  Discontinued     3.375 g 12.5 mL/hr over 240 Minutes Intravenous Every 8 hours 11/29/17 2157 11/30/17 1357   11/29/17 1300  ceFAZolin (ANCEF) IVPB 2g/100 mL premix     2 g 200 mL/hr over 30 Minutes Intravenous 30 min pre-op 11/28/17 1215 11/29/17 1722   11/21/17 1600  cefTRIAXone (ROCEPHIN) 2 g in sodium chloride 0.9 % 100 mL IVPB  Status:  Discontinued     2  g 200 mL/hr over 30 Minutes Intravenous Every 24 hours 11/21/17 1553 11/29/17 2135   11/21/17 0600  vancomycin (VANCOCIN) IVPB 750 mg/150 ml premix  Status:  Discontinued     750 mg 150 mL/hr over 60 Minutes Intravenous Every 12 hours 11/20/17 1703 11/21/17 1553   11/21/17 0230  ceFEPIme (MAXIPIME) 1 g in sodium chloride 0.9 % 100 mL IVPB  Status:  Discontinued     1 g 200 mL/hr over 30 Minutes Intravenous Every 8 hours 11/20/17 1835 11/21/17 1553   11/20/17 1930  azithromycin (ZITHROMAX) 500 mg in sodium chloride 0.9 % 250 mL IVPB  Status:  Discontinued     500 mg 250 mL/hr over 60 Minutes Intravenous Every 24 hours 11/20/17 1926 11/21/17 1553   11/20/17 1715  vancomycin (VANCOCIN) 2,000 mg in sodium  chloride 0.9 % 500 mL IVPB     2,000 mg 250 mL/hr over 120 Minutes Intravenous  Once 11/20/17 1703 11/20/17 2055   11/20/17 1700  ceFEPIme (MAXIPIME) 1 g in sodium chloride 0.9 % 100 mL IVPB     1 g 200 mL/hr over 30 Minutes Intravenous  Once 11/20/17 1657 11/20/17 1854        Objective:   Vitals:   12/01/17 1600 12/01/17 1700 12/01/17 1800 12/01/17 1900  BP: (!) 133/58 (!) 145/61 (!) 123/54 (!) 113/58  Pulse:  68 79 70  Resp: (!) 26 19 (!) 30 (!) 21  Temp:      TempSrc:      SpO2: (!) 88% 96% 98% 95%  Weight:      Height:        Wt Readings from Last 3 Encounters:  12/01/17 95.5 kg (210 lb 9.6 oz)  11/09/17 97.2 kg (214 lb 3.2 oz)  11/04/17 96.4 kg (212 lb 8 oz)     Intake/Output Summary (Last 24 hours) at 12/01/2017 1927 Last data filed at 12/01/2017 1900 Gross per 24 hour  Intake 2174.58 ml  Output 2995 ml  Net -820.42 ml     Physical Exam  Gen:-More awake, in no acute distress  HEENT:- [place].AT, No sclera icterus Ears-very very hard of hearing  neck-Supple Neck,No JVD, right subclavian central line Lungs-left-sided chest tubes with less sanguinous drainage  CV- S1, S2 normal Abd-  +ve B.Sounds, Abd Soft, No tenderness,    Extremity/Skin:- No  edema,   good  pulses Psych-alert and oriented x3 , affect is appropriate  neuro-no new focal deficits, no tremors   Data Review:   Micro Results Recent Results (from the past 240 hour(s))  Culture, blood (routine x 2)     Status: None   Collection Time: 11/24/17  1:00 PM  Result Value Ref Range Status   Specimen Description BLOOD LEFT ANTECUBITAL  Final   Special Requests   Final    BOTTLES DRAWN AEROBIC AND ANAEROBIC Blood Culture adequate volume   Culture   Final    NO GROWTH 5 DAYS Performed at [facility], [place] [address], [place], [place] [address]    Report Status 11/29/2017 FINAL  Final  Culture, blood (routine x 2)     Status: Abnormal   Collection Time: 11/24/17  1:09 PM  Result Value Ref Range Status   Specimen Description BLOOD RIGHT ANTECUBITAL  Final   Special Requests   Final    BOTTLES DRAWN AEROBIC ONLY Blood Culture results may not be optimal due to an inadequate volume of blood received in culture bottles   Culture  Setup Time   Final    GRAM POSITIVE COCCI AEROBIC BOTTLE ONLY CRITICAL RESULT CALLED TO, READ BACK BY AND VERIFIED WITH: Ferne Coe PharmD 10:25 11/25/17 ([email or web address])    Culture (A)  Final    STAPHYLOCOCCUS SPECIES (COAGULASE NEGATIVE) THE SIGNIFICANCE OF ISOLATING THIS ORGANISM FROM A SINGLE SET OF BLOOD CULTURES WHEN MULTIPLE SETS ARE DRAWN IS UNCERTAIN. PLEASE NOTIFY THE MICROBIOLOGY DEPARTMENT WITHIN ONE WEEK IF SPECIATION AND SENSITIVITIES ARE REQUIRED. Performed at [facility], [place] [address] [place],  [address]    Report Status 11/27/2017 FINAL  Final  Blood Culture ID Panel (Reflexed)     Status: Abnormal   Collection Time: 11/24/17  1:09 PM  Result Value Ref Range Status   Enterococcus species NOT DETECTED NOT DETECTED Final   Listeria monocytogenes NOT DETECTED NOT DETECTED  Final   Staphylococcus species DETECTED (A) NOT DETECTED Final    Comment: Methicillin (oxacillin) resistant coagulase negative staphylococcus. Possible blood  culture contaminant (unless isolated from more than one blood culture draw or clinical case suggests pathogenicity). No antibiotic treatment is indicated for blood  culture contaminants. CRITICAL RESULT CALLED TO, READ BACK BY AND VERIFIED WITH: Ferne Coe PharmD 10:25 11/25/17 ([email or web address])    Staphylococcus aureus NOT DETECTED NOT DETECTED Final   Methicillin resistance DETECTED (A) NOT DETECTED Final    Comment: CRITICAL RESULT CALLED TO, READ BACK BY AND VERIFIED WITH: Ferne Coe PharmD 10:25 11/25/17 ([email or web address])    Streptococcus species NOT DETECTED NOT DETECTED Final   Streptococcus agalactiae NOT DETECTED NOT DETECTED Final   Streptococcus pneumoniae NOT DETECTED NOT DETECTED Final   Streptococcus pyogenes NOT DETECTED NOT DETECTED Final   Acinetobacter baumannii NOT DETECTED NOT DETECTED Final   Enterobacteriaceae species NOT DETECTED NOT DETECTED Final   Enterobacter cloacae complex NOT DETECTED NOT DETECTED Final   Escherichia coli NOT DETECTED NOT DETECTED Final   Klebsiella oxytoca NOT DETECTED NOT DETECTED Final   Klebsiella pneumoniae NOT DETECTED NOT DETECTED Final   Proteus species NOT DETECTED NOT DETECTED Final   Serratia marcescens NOT DETECTED NOT DETECTED Final   Haemophilus influenzae NOT DETECTED NOT DETECTED Final   Neisseria meningitidis NOT DETECTED NOT DETECTED Final   Pseudomonas aeruginosa NOT DETECTED NOT DETECTED Final   Candida albicans NOT DETECTED NOT DETECTED Final   Candida glabrata NOT DETECTED NOT DETECTED Final   Candida krusei NOT DETECTED NOT DETECTED Final   Candida parapsilosis NOT DETECTED NOT DETECTED Final   Candida tropicalis NOT DETECTED NOT DETECTED Final    Comment: Performed at [facility], [address] [place], [place] [address]  Gram stain     Status: None   Collection Time: 11/25/17  3:43 PM  Result Value Ref Range Status   Specimen Description FLUID PLEURAL  Final   Special Requests NONE  Final   Gram Stain   Final    ABUNDANT  WBC PRESENT,BOTH PMN AND MONONUCLEAR NO ORGANISMS SEEN Performed at [facility], [address] [place], [place] [address]    Report Status 11/25/2017 FINAL  Final  Body fluid culture     Status: None   Collection Time: 11/26/17  1:01 PM  Result Value Ref Range Status   Specimen Description PLEURAL FLUID LEFT  Final   Special Requests NONE  Final   Gram Stain   Final    MODERATE WBC PRESENT,BOTH PMN AND MONONUCLEAR NO ORGANISMS SEEN    Culture   Final    NO GROWTH 3 DAYS Performed at [facility], [place] [address] [place], [place] [address]    Report Status 11/29/2017 FINAL  Final  Surgical pcr screen     Status: None   Collection Time: 11/27/17  2:54 PM  Result Value Ref Range Status   MRSA, PCR NEGATIVE NEGATIVE Final   Staphylococcus aureus NEGATIVE NEGATIVE Final    Comment: (NOTE) The Xpert SA Assay (FDA approved for NASAL specimens in patients 52 years of age and older), is one component of a comprehensive surveillance program. It is not intended to diagnose infection nor to guide or monitor treatment. Performed at [facility], [place] [address], [place], [place] [address]   Aerobic/Anaerobic Culture (surgical/deep wound)     Status: None (Preliminary result)   Collection Time: 11/29/17  5:47 PM  Result Value Ref Range Status  Specimen Description FLUID LEFT PLEURAL  Final   Special Requests NONE  Final   Gram Stain   Final    RARE WBC PRESENT, PREDOMINANTLY PMN NO ORGANISMS SEEN    Culture   Final    NO GROWTH 2 DAYS NO ANAEROBES ISOLATED; CULTURE IN PROGRESS FOR 5 DAYS Performed at [facility], [address] [place], [place] [address]    Report Status PENDING  Incomplete  Acid Fast Smear (AFB)     Status: None   Collection Time: 11/29/17  5:47 PM  Result Value Ref Range Status   AFB Specimen Processing Concentration  Final   Acid Fast Smear Negative  Final    Comment: (NOTE) Performed At: [facility] [place], [name] [id number] Rush Farmer MD MK:[id number]    Source (AFB) FLUID  Final    Comment: LEFT PLEURAL Performed at [facility], [place] [address] [place], [place] [address]   Aerobic/Anaerobic Culture (surgical/deep wound)     Status: None (Preliminary result)   Collection Time: 11/29/17  5:55 PM  Result Value Ref Range Status   Specimen Description TISSUE  Final   Special Requests LEFT PLEURAL PEEL  Final   Gram Stain   Final    MODERATE WBC PRESENT, PREDOMINANTLY PMN NO ORGANISMS SEEN    Culture   Final    NO GROWTH 2 DAYS NO ANAEROBES ISOLATED; CULTURE IN PROGRESS FOR 5 DAYS Performed at [facility], [place] [address], [place], [place] [address]    Report Status PENDING  Incomplete    Radiology Reports Dg Chest 1 View  Result Date: 11/26/2017 CLINICAL DATA:  Status post thoracentesis. EXAM: CHEST  1 VIEW COMPARISON:  Yesterday. FINDINGS: Significant decrease in amount of left pleural fluid following thoracentesis. No pneumothorax. Stable enlarged cardiac silhouette. Dense airspace opacity at the left lung base, increased. Clear right lung with an overlying skin fold. Bilateral shoulder degenerative changes. IMPRESSION: 1. No pneumothorax following left thoracentesis. 2. Dense left lower lobe atelectasis or pneumonia, significantly increased. 3. Stable cardiomegaly. Electronically Signed   By: Claudie Revering M.D.   On: 11/26/2017 13:06   Dg Chest 1 View  Result Date: 11/23/2017 CLINICAL DATA:  Hypoxia.  Atrial fibrillation. EXAM: CHEST  1 VIEW COMPARISON:  November 22, 2017 FINDINGS: There is opacity throughout much of the left lung, in part due to pleural effusion. There is also patchy airspace consolidation throughout much of the left lung. There is slight atelectatic change in the right base. The right lung is otherwise clear. Heart is mildly enlarged with pulmonary vascularity within normal limits. Pacemaker lead is attached to the right ventricle. No adenopathy. There  is aortic atherosclerosis. There is degenerative change in each shoulder. IMPRESSION: Opacity throughout much of the left lung likely due to a combination of pleural effusion and airspace consolidation, essentially stable. Slight right base atelectasis. Right lung otherwise clear. Stable cardiomegaly. Stable pacemaker placement. There is aortic atherosclerosis. Aortic Atherosclerosis (ICD10-I70.0). Electronically Signed   By: Lowella Grip III M.D.   On: 11/23/2017 08:17   Dg Chest 1 View  Result Date: 11/22/2017 CLINICAL DATA:  Progressive cough, congestion, and shortness of breath. Fall. EXAM: CHEST  1 VIEW COMPARISON:  Two-view chest x-ray 11/20/2017 FINDINGS: The heart size is exaggerated by low lung volumes. Increased asymmetric left-sided interstitial and airspace disease is noted. A left pleural effusion is present. Right-sided interstitial disease is increasing. IMPRESSION: 1. Increasing interstitial and airspace disease in the left  lung consistent with edema and infection. 2. Increasing left pleural effusion. 3. Progressive edema in the right lung. Electronically Signed   By: San Morelle M.D.   On: 11/22/2017 07:34   Dg Chest 2 View  Result Date: 11/20/2017 CLINICAL DATA:  84 year old with chronic cough, presenting with acute worsening, associated with congestion, shortness of breath and fever. EXAM: CHEST - 2 VIEW COMPARISON:  07/21/2017, 07/19/2017, 01/08/2015 and earlier. FINDINGS: AP ERECT and LATERAL images were obtained. LEFT subclavian single lead transvenous pacemaker with the lead tip at the expected location of the RV apex, unchanged. Cardiac silhouette moderately enlarged, unchanged since earlier this year but increased in size since 2016. Thoracic aorta mildly atherosclerotic, unchanged. Hilar and mediastinal contours otherwise unremarkable. Airspace consolidation involving the LEFT lower lobe associated with a LEFT pleural effusion. Pulmonary venous hypertension without overt  edema. Degenerative changes involving the thoracic and UPPER lumbar spine. IMPRESSION: 1. Acute LEFT lower lobe pneumonia with an associated LEFT pleural effusion. 2. Stable moderate cardiomegaly. Pulmonary venous hypertension without overt edema. Electronically Signed   By: Evangeline Dakin M.D.   On: 11/20/2017 14:10   Ct Head Wo Contrast  Result Date: 11/20/2017 CLINICAL DATA:  Tripped and fell 3 days ago, hitting the head. Bruising to the right temple. On anticoagulation. Initial encounter. EXAM: CT HEAD WITHOUT CONTRAST TECHNIQUE: Contiguous axial images were obtained from the base of the skull through the vertex without intravenous contrast. COMPARISON:  07/20/2017 FINDINGS: Brain: There is no evidence of acute infarct, intracranial hemorrhage, mass, midline shift, or extra-axial fluid collection. Mild cerebral atrophy is unchanged. Periventricular white matter hypodensities are also unchanged and nonspecific but compatible with mild chronic small vessel ischemic disease. Vascular: Calcified atherosclerosis at the skull base. No hyperdense vessel. Skull: No fracture or focal osseous lesion. Sinuses/Orbits: Improved paranasal sinus aeration. Moderate residual right ethmoid and right maxillary sinus mucosal thickening. Large right mastoid effusion, mildly increased from prior. Resolved right middle ear opacification. At most trace fluid at the left mastoid tip. Other: Small right frontotemporal scalp hematoma. IMPRESSION: 1. No evidence of acute intracranial abnormality. 2. Small right frontotemporal scalp hematoma. 3. Mild chronic small vessel ischemic disease. 4. Persistent large right mastoid effusion. Resolved right middle ear effusion. 5. Improved paranasal sinus aeration. Electronically Signed   By: Logan Bores M.D.   On: 11/20/2017 15:14   Ct Chest Wo Contrast  Result Date: 11/23/2017 CLINICAL DATA:  Left pleural effusion. EXAM: CT CHEST WITHOUT CONTRAST TECHNIQUE: Multidetector CT imaging of the  chest was performed following the standard protocol without IV contrast. COMPARISON:  Chest x-ray from same day. CT chest dated April 17, 2014. FINDINGS: Cardiovascular: Mild cardiomegaly with unchanged left chest wall pacemaker. No pericardial effusion. Normal caliber thoracic aorta. Coronary, aortic arch, and branch vessel atherosclerotic vascular disease. Mediastinum/Nodes: Multiple prominent bilateral axillary, left supraclavicular, and mediastinal lymph nodes measuring up to 10 mm in short axis are overall similar to prior study. The thyroid gland trachea, and esophagus demonstrate no significant findings. Lungs/Pleura: Moderate to large left pleural effusion. Trace right pleural effusion. Complete collapse of the left lower lobe. Areas of mucous impaction in the right lower lobe with subsegmental atelectasis. Mild diffuse peribronchial thickening. No consolidation or pneumothorax. No suspicious pulmonary nodule. Mild mosaic attenuation throughout the lungs. Upper Abdomen: No acute abnormality. Musculoskeletal: No chest wall abnormality. No acute or significant osseous findings. Degenerative changes of the thoracic spine. IMPRESSION: 1. Moderate to large left pleural effusion with complete collapse of the left lower lobe. 2.  Trace right pleural effusion with right lower lobe subsegmental atelectasis. 3. Mild mosaic attenuation throughout the lungs could reflect chronic small airways or small vessel disease. 4. Prominent bilateral axillary, left supraclavicular, and mediastinal lymph nodes measuring up to 10 mm in short axis are overall similar to prior study and likely reflect patient's history of chronic lymphocytic leukemia. 5.  Aortic atherosclerosis (ICD10-I70.0). Electronically Signed   By: Titus Dubin M.D.   On: 11/23/2017 18:00   Dg Chest Port 1 View  Result Date: 12/01/2017 CLINICAL DATA:  Status post VATS for empyema 11/29/2017. Chest tube in place. EXAM: PORTABLE CHEST 1 VIEW COMPARISON:   Single-view of the chest 11/30/2017 and 11/29/2017. FINDINGS: Right subclavian central venous catheter and 2 left chest tubes remain in place. Small left pleural effusion and basilar airspace disease are unchanged. No pneumothorax. Subsegmental atelectasis in the right base has improved since yesterday's examination. Heart size is mildly enlarged. Aortic atherosclerosis is noted. IMPRESSION: Negative for pneumothorax with left chest tubes in place. No change in a small left effusion and basilar airspace disease. Improved right basilar subsegmental atelectasis. Electronically Signed   By: Inge Rise M.D.   On: 12/01/2017 09:36   Dg Chest Port 1 View  Result Date: 11/30/2017 CLINICAL DATA:  Status post thoracotomy. History of leukemia, atrial fibrillation. EXAM: PORTABLE CHEST 1 VIEW COMPARISON:  Chest radiograph November 30, 2017 FINDINGS: Stable cardiomegaly. Two LEFT apical chest tubes in place. LEFT apical pleural thickening. No pneumothorax. RIGHT subclavian central venous catheter distal tip projects in distal superior vena cava. Bibasilar strandy densities. Single lead LEFT cardiac pacemaker. Similar LEFT chest wall subcutaneous gas. IMPRESSION: Stable life-support lines including 2 LEFT apical chest tubes. LEFT apical pleural thickening and bibasilar atelectasis. No pneumothorax. Stable cardiomegaly. Electronically Signed   By: Elon Alas M.D.   On: 11/30/2017 14:25   Dg Chest Port 1 View  Result Date: 11/30/2017 CLINICAL DATA:  Status post VATS on the left EXAM: PORTABLE CHEST 1 VIEW COMPARISON:  11/30/2017 FINDINGS: Pacemaker is again identified and stable. Right central venous line is noted and stable. Two chest tubes are seen on the left. Significant improved aeration in the left is noted when compared with the prior exam. Degenerative changes of the thoracic spine are noted. IMPRESSION: Significant improved aeration on the left. Electronically Signed   By: Inez Catalina M.D.   On:  11/30/2017 13:50   Dg Chest Port 1 View  Result Date: 11/30/2017 CLINICAL DATA:  Central line placement. EXAM: PORTABLE CHEST 1 VIEW COMPARISON:  Radiograph earlier this day at 0014 hour FINDINGS: Right subclavian central line with tip in the distal SVC. No right pneumothorax. Left chest tube is unchanged in position with tip directed towards the apex. Slight decreased density of the suspected extrapleural left lung opacity. Decreased conspicuity of the pneumothorax versus pneumomediastinum adjacent to the left heart border. Linear atelectasis in the right midlung versus fluid in the fissure. IMPRESSION: 1. Right subclavian central line with tip in the distal SVC. No right pneumothorax. 2. Slightly decreased density of the presumed extrapleural opacity in the left hemithorax. Left chest tubes remain in place. Decreasing conspicuity of the pneumomediastinum versus medial pneumothorax adjacent to the left heart border. Electronically Signed   By: Jeb Levering M.D.   On: 11/30/2017 04:28   Dg Chest Port 1 View  Result Date: 11/30/2017 CLINICAL DATA:  Thoracotomy scar left chest. EXAM: PORTABLE CHEST 1 VIEW COMPARISON:  Radiograph yesterday. FINDINGS: Significant change from prior exam with near complete  opacification of the left hemithorax, opacity primarily laterally and superiorly with lobular borders, suggesting extrapleural process. Two left chest tubes are in place. Pneumomediastinum versus medial pneumothorax along the left heart border. Left-sided pacemaker in place. Mild cardiomegaly is similar. Right lung is grossly clear. IMPRESSION: 1. Significant change from prior exam with near complete opacification of left hemithorax, opacity primarily peripheral and superiorly with lobular borders suggesting extrapleural. Given recent thoracoscopy, hemothorax is primary concern. 2. Pneumomediastinum versus pneumothorax along the left heart border. These results will be called to the ordering clinician or  representative by the Radiologist Assistant, and communication documented in the PACS or zVision Dashboard. Electronically Signed   By: Jeb Levering M.D.   On: 11/30/2017 01:14   Dg Chest Port 1 View  Result Date: 11/29/2017 CLINICAL DATA:  Postop VATS.  Empyema. EXAM: PORTABLE CHEST 1 VIEW COMPARISON:  Chest radiograph 11/29/2017 FINDINGS: Left-sided chest tube and drain tips are at the left lung apex. There is bibasilar atelectasis without focal consolidation. No pneumothorax or sizable pleural effusion. Unchanged cardiomegaly. IMPRESSION: No pneumothorax. Electronically Signed   By: Ulyses Jarred M.D.   On: 11/29/2017 20:20   Dg Chest Port 1 View  Result Date: 11/29/2017 CLINICAL DATA:  Shortness of Breath EXAM: PORTABLE CHEST 1 VIEW COMPARISON:  11/27/2017 FINDINGS: Cardiac shadow is stable. Pacing device is again seen. Right lung is clear. Small left pleural effusion is noted. There is a rounded area of density noted laterally likely related to a loculated component. No bony abnormality is seen. IMPRESSION: Stable appearing left pleural effusion with some degree of loculation. Electronically Signed   By: Inez Catalina M.D.   On: 11/29/2017 08:59   Dg Chest Port 1 View  Result Date: 11/27/2017 CLINICAL DATA:  Pneumonia EXAM: PORTABLE CHEST 1 VIEW COMPARISON:  November 26, 2017 FINDINGS: There is layering pleural effusion on the left. There is consolidation in the left base. There is mild right base atelectasis. Right lung otherwise is clear. Heart is mildly enlarged with pulmonary vascularity normal. Pacemaker lead attached to right ventricle. No pneumothorax. No adenopathy. No bone lesions. IMPRESSION: Layering pleural effusion on the left with persistent consolidation left base. Mild right base atelectasis. Stable cardiac prominence. Electronically Signed   By: Lowella Grip III M.D.   On: 11/27/2017 09:27   Dg Chest Port 1 View  Result Date: 11/25/2017 CLINICAL DATA:  84 year old male with  shortness of breath and pleural effusion. EXAM: PORTABLE CHEST 1 VIEW COMPARISON:  11/23/2017 CT, chest radiograph and prior studies FINDINGS: Cardiomediastinal silhouette is unchanged. A LEFT pacemaker again noted. LEFT pleural effusion and LEFT LOWER lung consolidation/atelectasis again noted. The RIGHT lung is clear. No pneumothorax or acute bony abnormality. IMPRESSION: Little significant change in appearance of the chest with continued LEFT pleural effusion and LEFT LOWER lung atelectasis/consolidation. Electronically Signed   By: Margarette Canada M.D.   On: 11/25/2017 16:53   Dg Swallowing Func-speech Pathology  Result Date: 11/23/2017 Objective Swallowing Evaluation: Type of Study: MBS-Modified Barium Swallow Study  Patient Details Name: TERRI MALERBA MRN: [id number] Date of Birth: September 06, 1932 Today's Date: 11/23/2017 Time: SLP Start Time (ACUTE ONLY): 4081 -SLP Stop Time (ACUTE ONLY): 1637 SLP Time Calculation (min) (ACUTE ONLY): 53 min Past Medical History: Past Medical History: Diagnosis Date . Asthma 1950's  history of . Atrial fibrillation ([place])  . Bilateral carpal tunnel syndrome  . Bilateral lower extremity edema  . Bladder tumor  . Chronic systolic heart failure ([place])   Echo 1/19: Mild LVH,  EF 45-50, inf HK, MAC, severe LAE, severe RAE // Echo 7/15: Mild LVH, mod focal basal sept hypertrophy, EF 55-60, AV peak and mean 16/9, trivial Oscar, mod LAE, PASP 38 . CLL (chronic lymphocytic leukemia) [facility]) oncologist-  dr Ilene Qua--  dx [phone] ;  Lymphocytosis, CLL - per lov note 05-11-2017 currently under active survillance,  CT 04-17-2014 show very small lymphadenopathy, no indication for treatment . Coronary artery disease   cardiologist-  dr Cathie Olden--  08-18-2017 Intermittant risk nuclear study w/ large area of inferior infartion with no evidence ishcemia  . Deafness in right ear  . Diabetes mellitus type 2, noninsulin dependent ([place])  . Elevated PSA   since prostatectomy but now resolved . Hematuria 04/2017 .  History of ear infection   Right . History of MI (myocardial infarction)   per myoview nuclear study 08-18-2017 , unknown when . History of shingles 08/2017  L ear and scalp, possible . Hyperlipidemia  . Hypertension  . Ischemic cardiomyopathy 09/01/2017  Presumed +CAD with Nuclear stress test 08/18/17 - Inferior scar, no ischemia, intermediate risk // med management unless +angina or worse dyspnea . OA (osteoarthritis)  . Pacemaker 02/08/2014  followed by dr g. taylor--  single chamber Biotronik due to SSS . Permanent atrial fibrillation ([place])  . Pneumonia 2019  Left lung . Prostate cancer [facility]) urologist-  dr Diona Fanti  dx 2004--  Gleason 8, PSA 10.45--  11-28-2002  s/p  radical prostatectomy;  recurrent w/ increasing PSA, started ADT treatment . RBBB (right bundle branch block)  . Sick sinus syndrome ([place])   a-Flutter with episodes of bradycardia; S/P Biotronik (serial number [id number]) 02-08-2014 . Urinary incontinence  . Wears hearing aid in right ear   receiver and transmitter Past Surgical History: Past Surgical History: Procedure Laterality Date . APPENDECTOMY   . BACK SURGERY    disk . CARDIAC CATHETERIZATION  09-03-1999  dr Cathie Olden  abnormal cardiolite study:  minor luminal irregularities but no critial coronary artery stenosis . CARDIOVASCULAR STRESS TEST  08-18-2017  dr Cathie Olden  Intermediate risk nuclear study w/ large area inferior infarction, no evidence of ishcemia (consistant w/ prior MI)/  study not gated due to frequent PVCs . CARPAL TUNNEL RELEASE Right 2000 . CARPAL TUNNEL RELEASE Left 11/19/2009 . CATARACT EXTRACTION Right 07/2015 . CATARACT EXTRACTION Left 09/2015 . CYSTOSCOPY N/A 11/04/2017  Procedure: CYSTOSCOPY AND CAUTERIZATION OF BLADDER;  Surgeon: Franchot Gallo, MD;  Location: [facility];  Service: Urology;  Laterality: N/A; . INSERTION PENILE PROSTHESIS  02-22-2004    dr Mattie Marlin  [facility] . KNEE ARTHROSCOPY Left 07/2010 . PERMANENT PACEMAKER INSERTION N/A 02/08/2014  Procedure:  PERMANENT PACEMAKER INSERTION;  Surgeon: Evans Lance, MD;  Location: [facility] CATH LAB;  Service: Cardiovascular;  Laterality: N/A; . RADICAL RETROPUBIC PROSTATECTOMY W/ BILATERAL PELVIC LYMPH NODE DISSECTION  11-28-2002   dr Mattie Marlin  [facility] . TONSILLECTOMY   . TOTAL HIP ARTHROPLASTY Left 05/12/2016  Procedure: LEFT TOTAL HIP ARTHROPLASTY ANTERIOR APPROACH;  Surgeon: Paralee Cancel, MD;  Location: [facility] ORS;  Service: Orthopedics;  Laterality: Left; . TOTAL HIP ARTHROPLASTY Right 07-15-2006   dr Alvan Dame  [facility] . TRANSTHORACIC ECHOCARDIOGRAM  07/20/2017  mild LVH, ef 45-50%, hypokinesis of the basal-midinferior myocardium, due to AFib unable to evaluate diastolic function/  severe LAE and RAE/  trivial PR and TR HPI: Pt is an 84 year-old male with a history of CLL, prostate cancer, sick sinus syndrome, pacemaker, paroxysmal atrial fibrillation, CHF with ejection fraction 45% admitted with LLL PNA. Per  consult note from critical care, pt/wife describe cyclic cough for over a year, with coughing sometimes escalating during meals, especially with dry foods.  Subjective: alert, denies swallowing difficulty Assessment / Plan / Recommendation CHL IP CLINICAL IMPRESSIONS 11/23/2017 Clinical Impression Pt has a mild oropharyngeal dysphagia with structural component due to suspected osteophytes most prominent at C3-4, C4-5 that impede full epiglottic deflection and airway closure. What further impacts his ability to protect his airway is his impulsive intake (large boluses and fast rate) and premature spillage. Thin and nectar thick liquids spill into the pharynx, filling the valleculae and spilling onward to the pyriform sinuses before the swallow. Then, as he swallows, the thin liquids spills into the partially open airway, allowing for trace amounts of penetration and aspiration. Pt had significant baseline coughing that made it very challenging to determine whether coughing was related to aspiration or not. SLP provided Mod cues for  attempts at a chin tuck, oral holding, and smaller bolus sizes. Small sips were the most effective at protecting the airway with thin liquids although I suspect if he could have performed the oral hold that this would further increase his safety with swallowing. Unfortunately, pt could not consistently utilize these strategies. No airway compromise occurred with thicker liquids or solids, although pt did have to take intermittent pauses during oral prep with solids to catch his breath. Although pt's dysphagia is mild, considering his impulsivity as well as his recurrent PNA and chronic cough, would favor starting a more conservative diet at this time: Dys 3 diet and nectar thick liquids. SLP will f/u to facilitate hopeful transition back to thin liquids with improved ability to take small sips, contain liquids orally, and increase oral care/use of aspiration precautions.  SLP Visit Diagnosis Dysphagia, oropharyngeal phase (R13.12) Attention and concentration deficit following -- Frontal lobe and executive function deficit following -- Impact on safety and function Mild aspiration risk   CHL IP TREATMENT RECOMMENDATION 11/23/2017 Treatment Recommendations Therapy as outlined in treatment plan below   Prognosis 11/23/2017 Prognosis for Safe Diet Advancement Good Barriers to Reach Goals -- Barriers/Prognosis Comment -- CHL IP DIET RECOMMENDATION 11/23/2017 SLP Diet Recommendations Dysphagia 3 (Mech soft) solids;Nectar thick liquid Liquid Administration via Cup Medication Administration Whole meds with puree Compensations Slow rate;Small sips/bites Postural Changes Seated upright at 90 degrees   CHL IP OTHER RECOMMENDATIONS 11/23/2017 Recommended Consults -- Oral Care Recommendations Oral care BID Other Recommendations Order thickener from pharmacy;Prohibited food (jello, ice cream, thin soups);Remove water pitcher   CHL IP FOLLOW UP RECOMMENDATIONS 11/23/2017 Follow up Recommendations Outpatient SLP;Home health SLP   CHL IP  FREQUENCY AND DURATION 11/23/2017 Speech Therapy Frequency (ACUTE ONLY) min 2x/week Treatment Duration 2 weeks      CHL IP ORAL PHASE 11/23/2017 Oral Phase Impaired Oral - Pudding Teaspoon -- Oral - Pudding Cup -- Oral - Honey Teaspoon -- Oral - Honey Cup -- Oral - Nectar Teaspoon -- Oral - Nectar Cup Decreased bolus cohesion;Premature spillage Oral - Nectar Straw -- Oral - Thin Teaspoon -- Oral - Thin Cup Decreased bolus cohesion;Premature spillage Oral - Thin Straw Decreased bolus cohesion;Premature spillage Oral - Puree Piecemeal swallowing Oral - Mech Soft Delayed oral transit Oral - Regular -- Oral - Multi-Consistency -- Oral - Pill -- Oral Phase - Comment --  CHL IP PHARYNGEAL PHASE 11/23/2017 Pharyngeal Phase Impaired Pharyngeal- Pudding Teaspoon -- Pharyngeal -- Pharyngeal- Pudding Cup -- Pharyngeal -- Pharyngeal- Honey Teaspoon -- Pharyngeal -- Pharyngeal- Honey Cup -- Pharyngeal -- Pharyngeal- Nectar Teaspoon --  Pharyngeal -- Pharyngeal- Nectar Cup Reduced epiglottic inversion;Reduced airway/laryngeal closure;Delayed swallow initiation-pyriform sinuses Pharyngeal -- Pharyngeal- Nectar Straw -- Pharyngeal -- Pharyngeal- Thin Teaspoon -- Pharyngeal -- Pharyngeal- Thin Cup Reduced epiglottic inversion;Reduced airway/laryngeal closure;Delayed swallow initiation-pyriform sinuses;Penetration/Aspiration during swallow Pharyngeal Material enters airway, CONTACTS cords and not ejected out Pharyngeal- Thin Straw Reduced epiglottic inversion;Reduced airway/laryngeal closure;Delayed swallow initiation-pyriform sinuses;Penetration/Aspiration during swallow Pharyngeal Material enters airway, passes BELOW cords without attempt by patient to eject out (silent aspiration) Pharyngeal- Puree Reduced epiglottic inversion;Reduced airway/laryngeal closure Pharyngeal -- Pharyngeal- Mechanical Soft Reduced epiglottic inversion;Reduced airway/laryngeal closure Pharyngeal -- Pharyngeal- Regular -- Pharyngeal -- Pharyngeal-  Multi-consistency -- Pharyngeal -- Pharyngeal- Pill -- Pharyngeal -- Pharyngeal Comment --  CHL IP CERVICAL ESOPHAGEAL PHASE 11/23/2017 Cervical Esophageal Phase WFL Pudding Teaspoon -- Pudding Cup -- Honey Teaspoon -- Honey Cup -- Nectar Teaspoon -- Nectar Cup -- Nectar Straw -- Thin Teaspoon -- Thin Cup -- Thin Straw -- Puree -- Mechanical Soft -- Regular -- Multi-consistency -- Pill -- Cervical Esophageal Comment -- No flowsheet data found. [name] 11/23/2017, 5:04 PM  [name], M.A. CCC-SLP [phone]             Ir Thoracentesis Asp Pleural Space W/img Guide  Result Date: 11/26/2017 INDICATION: Patient with left pleural effusion. Request is made for diagnostic and therapeutic thoracentesis. EXAM: ULTRASOUND GUIDED DIAGNOSTIC AND THERAPEUTIC LEFT THORACENTESIS MEDICATIONS: 10 mL 2% lidocaine COMPLICATIONS: None immediate. PROCEDURE: An ultrasound guided thoracentesis was thoroughly discussed with the patient and questions answered. The benefits, risks, alternatives and complications were also discussed. The patient understands and wishes to proceed with the procedure. Written consent was obtained. Ultrasound was performed to localize and mark an adequate pocket of fluid in the left chest. The area was then prepped and draped in the normal sterile fashion. 2% lidocaine was used for local anesthesia. Under ultrasound guidance a Safe-T-Centesis catheter was introduced. Thoracentesis was performed. The catheter was removed and a dressing applied. FINDINGS: A total of approximately 70 mL of cloudy yellow fluid was removed. Samples were sent to the laboratory as requested by the clinical team. IMPRESSION: Successful ultrasound guided diagnostic and therapeutic left thoracentesis yielding 70 mL of pleural fluid. Read by: Brynda Greathouse PA-C Electronically Signed   By: Markus Daft M.D.   On: 11/26/2017 14:15     CBC Recent Labs  Lab 11/25/17 0532 11/26/17 0403 11/27/17 0240 11/28/17 0257  11/29/17 0211  11/30/17 0226 11/30/17 0634 11/30/17 1115 11/30/17 1434 11/30/17 2242 12/01/17 0430  WBC 43.6* 37.3* 37.7* 47.4* 51.3*   < > 56.8* 43.7*  --  45.5* 32.5* 30.1*  HGB 9.4* 9.8* 10.2* 10.3* 10.3*   < > 7.4* 7.2* 9.2* 10.2* 8.8* 8.5*  HCT 27.4* 28.9* 30.2* 31.1* 31.5*   < > 22.7* 21.6* 27.0* 31.0* 26.3* 25.5*  PLT 507* 550* 631* 671* 715*   < > 551* 377  --  352 353 333  MCV 94.8 96.0 95.9 96.9 98.1   < > 97.0 93.9  --  90.9 89.8 90.7  MCH 32.5 32.6 32.4 32.1 32.1   < > 31.6 31.3  --  29.9 30.0 30.2  MCHC 34.3 33.9 33.8 33.1 32.7   < > 32.6 33.3  --  32.9 33.5 33.3  RDW 18.2* 18.5* 18.6* 18.7* 18.9*   < > 18.6* 17.8*  --  19.1* 18.8* 18.9*  LYMPHSABS 22.7* 21.3* 26.8* 31.8* 34.4*  --   --   --   --   --   --   --  MONOABS 1.3* 2.6* 1.1* 1.9* 1.0  --   --   --   --   --   --   --   EOSABS 0.0 0.0 0.0 0.0 0.0  --   --   --   --   --   --   --   BASOSABS 0.0 0.0 0.0 0.0 0.0  --   --   --   --   --   --   --    < > = values in this interval not displayed.    Chemistries  Recent Labs  Lab 11/28/17 1301 11/29/17 0211 11/29/17 2200 11/30/17 0117 11/30/17 0634 11/30/17 1115 11/30/17 2242 12/01/17 0430  NA 135 138 134* 133* 144 136 137 139  K 5.3* 4.7 5.6* 6.6* 3.1* 4.8 3.8 3.9  CL 97* 99* 103 99* 122*  --  103 107  CO2 _0 --  16*  --  26 27  GLUCOSE 320* 102* 309* 343* 167*  --  168* 129*  BUN 30* 30* 34* 49* 28*  --  37* 34*  CREATININE 1.01 0.98 1.08 1.10 0.92  --  1.37* 1.21  CALCIUM 9.1 9.1 8.2*  --  4.9*  --  7.8* 7.7*  AST 22  --   --   --   --   --   --   --   ALT 20  --   --   --   --   --   --   --   ALKPHOS 77  --   --   --   --   --   --   --   BILITOT 1.0  --   --   --   --   --   --   --    ------------------------------------------------------------------------------------------------------------------ No results for input(s): CHOL, HDL, LDLCALC, TRIG, CHOLHDL, LDLDIRECT in the last 72 hours.  Lab Results  Component Value Date   HGBA1C 6.0  (H) 05/05/2016   ------------------------------------------------------------------------------------------------------------------ No results for input(s): TSH, T4TOTAL, T3FREE, THYROIDAB in the last 72 hours.  Invalid input(s): FREET3 ------------------------------------------------------------------------------------------------------------------ No results for input(s): VITAMINB12, FOLATE, FERRITIN, TIBC, IRON, RETICCTPCT in the last 72 hours.  Coagulation profile Recent Labs  Lab 11/27/17 0240 11/28/17 0257 11/29/17 0211 11/30/17 0226 11/30/17 0634  INR 1.44 1.38 1.33 2.06 1.40    No results for input(s): DDIMER in the last 72 hours.  Cardiac Enzymes No results for input(s): CKMB, TROPONINI, MYOGLOBIN in the last 168 hours.  Invalid input(s): CK ------------------------------------------------------------------------------------------------------------------    Component Value Date/Time   BNP 717.8 (H) 11/20/2017 1339     Roxan Hockey M.D on 12/01/2017 at 7:27 PM  Between 7am to 7pm - Pager - [phone]  After 7pm go to [email or web address] - password [email or web address]  [facility] -  Office  [phone]   Voice Recognition Viviann Spare dictation system was used to create this note, attempts have been made to correct errors. Please contact the author with questions and/or clarifications.

## 2017-12-02 ENCOUNTER — Inpatient Hospital Stay (HOSPITAL_COMMUNITY): Payer: Medicare Other

## 2017-12-02 ENCOUNTER — Ambulatory Visit: Payer: Medicare Other | Admitting: Cardiovascular Disease

## 2017-12-02 LAB — COMPREHENSIVE METABOLIC PANEL
ALT: 33 U/L (ref 17–63)
AST: 36 U/L (ref 15–41)
Albumin: 2.5 g/dL — ABNORMAL LOW (ref 3.5–5.0)
Alkaline Phosphatase: 60 U/L (ref 38–126)
Anion gap: 7 (ref 5–15)
BUN: 20 mg/dL (ref 6–20)
CO2: 30 mmol/L (ref 22–32)
Calcium: 8.1 mg/dL — ABNORMAL LOW (ref 8.9–10.3)
Chloride: 102 mmol/L (ref 101–111)
Creatinine, Ser: 0.83 mg/dL (ref 0.61–1.24)
GFR calc Af Amer: >60 mL/min (ref >60)
GFR calc non Af Amer: >60 mL/min (ref >60)
Glucose, Bld: 181 mg/dL — ABNORMAL HIGH (ref 65–99)
Potassium: 4.2 mmol/L (ref 3.5–5.1)
Sodium: 139 mmol/L (ref 135–145)
Total Bilirubin: 0.7 mg/dL (ref 0.3–1.2)
Total Protein: 4.7 g/dL — ABNORMAL LOW (ref 6.5–8.1)

## 2017-12-02 LAB — BPAM RBC
Blood Product Expiration Date: 201907052359
Blood Product Expiration Date: 201907052359
Blood Product Expiration Date: 201907072359
Blood Product Expiration Date: 201907072359
Blood Product Expiration Date: 201907072359
Blood Product Expiration Date: 201907072359
Blood Product Expiration Date: 201907072359
Blood Product Expiration Date: 201907072359
ISSUE DATE / TIME: 201906110038
ISSUE DATE / TIME: 201906110234
ISSUE DATE / TIME: 201906110234
ISSUE DATE / TIME: 201906110854
ISSUE DATE / TIME: 201906111014
ISSUE DATE / TIME: 201906111437
Unit Type and Rh: 5100
Unit Type and Rh: 5100
Unit Type and Rh: 5100
Unit Type and Rh: 5100
Unit Type and Rh: 5100
Unit Type and Rh: 5100
Unit Type and Rh: 5100
Unit Type and Rh: 5100

## 2017-12-02 LAB — TYPE AND SCREEN
ABO/RH(D): O POS
Antibody Screen: NEGATIVE
Unit division: 0
Unit division: 0
Unit division: 0
Unit division: 0
Unit division: 0
Unit division: 0
Unit division: 0
Unit division: 0

## 2017-12-02 LAB — GLUCOSE, CAPILLARY
GLUCOSE-CAPILLARY: 165 mg/dL — AB (ref 65–99)
GLUCOSE-CAPILLARY: 354 mg/dL — AB (ref 65–99)
GLUCOSE-CAPILLARY: 300 mg/dL — AB (ref 65–99)
Glucose-Capillary: 254 mg/dL — ABNORMAL HIGH (ref 65–99)

## 2017-12-02 LAB — CBC
HCT: 28.8 % — ABNORMAL LOW (ref 39.0–52.0)
Hemoglobin: 9.1 g/dL — ABNORMAL LOW (ref 13.0–17.0)
MCH: 30 pg (ref 26.0–34.0)
MCHC: 31.6 g/dL (ref 30.0–36.0)
MCV: 95 fL (ref 78.0–100.0)
Platelets: 359 10*3/uL (ref 150–400)
RBC: 3.03 MIL/uL — ABNORMAL LOW (ref 4.22–5.81)
RDW: 19.1 % — ABNORMAL HIGH (ref 11.5–15.5)
WBC: 30.4 10*3/uL — ABNORMAL HIGH (ref 4.0–10.5)

## 2017-12-02 LAB — POTASSIUM: Potassium: 4.3 mmol/L (ref 3.5–5.1)

## 2017-12-02 MED ORDER — ENOXAPARIN SODIUM 30 MG/0.3ML ~~LOC~~ SOLN
30.0000 mg | SUBCUTANEOUS | Status: DC
  Administered 2017-12-02 – 2017-12-04 (×3): 30 mg via SUBCUTANEOUS
  Filled 2017-12-02 (×3): qty 0.3

## 2017-12-02 MED ORDER — PREDNISONE 20 MG PO TABS
20.0000 mg | ORAL_TABLET | Freq: Every day | ORAL | Status: DC
  Administered 2017-12-03 – 2017-12-04 (×2): 20 mg via ORAL
  Filled 2017-12-02 (×2): qty 1

## 2017-12-02 MED ORDER — SORBITOL 70 % SOLN
30.0000 mL | Freq: Once | Status: AC
  Administered 2017-12-02: 30 mL via ORAL
  Filled 2017-12-02: qty 30

## 2017-12-02 MED ORDER — SORBITOL 70 % PO SOLN
30.0000 mL | Freq: Once | ORAL | Status: DC
  Filled 2017-12-02 (×2): qty 30

## 2017-12-02 MED ORDER — METHYLPREDNISOLONE SODIUM SUCC 40 MG IJ SOLR: 20.0000 mg | Freq: Every day | INTRAMUSCULAR | Status: DC

## 2017-12-02 MED ORDER — INSULIN GLARGINE 100 UNIT/ML ~~LOC~~ SOLN
12.0000 [IU] | Freq: Every day | SUBCUTANEOUS | Status: DC
  Administered 2017-12-02: 12 [IU] via SUBCUTANEOUS
  Filled 2017-12-02: qty 0.12

## 2017-12-02 MED ORDER — ENSURE ENLIVE PO LIQD
237.0000 mL | Freq: Three times a day (TID) | ORAL | Status: DC
  Administered 2017-12-02 – 2017-12-05 (×5): 237 mL via ORAL

## 2017-12-02 MED ORDER — CLONAZEPAM 0.5 MG PO TABS
0.5000 mg | ORAL_TABLET | Freq: Every day | ORAL | Status: DC
  Administered 2017-12-02 – 2017-12-04 (×3): 0.5 mg via ORAL
  Filled 2017-12-02 (×3): qty 1

## 2017-12-02 MED ORDER — SENNOSIDES-DOCUSATE SODIUM 8.6-50 MG PO TABS
2.0000 | ORAL_TABLET | Freq: Every day | ORAL | Status: DC
  Administered 2017-12-02 – 2017-12-04 (×3): 2 via ORAL
  Filled 2017-12-02 (×3): qty 2

## 2017-12-02 MED ORDER — LEVALBUTEROL HCL 0.63 MG/3ML IN NEBU
0.6300 mg | INHALATION_SOLUTION | Freq: Three times a day (TID) | RESPIRATORY_TRACT | Status: DC
  Administered 2017-12-02 – 2017-12-03 (×5): 0.63 mg via RESPIRATORY_TRACT
  Filled 2017-12-02 (×6): qty 3

## 2017-12-02 NOTE — Progress Notes (Signed)
CT Surg   Productive cough Walking  nsr

## 2017-12-02 NOTE — Discharge Summary (Addendum)
[address]       [place] [address]             [phone]      Physician Discharge Summary  Patient ID: Oscar Baker MRN: [id number] DOB/AGE: August 22, 1932 84 y.o.  Admit date: 11/20/2017 Discharge date: 12/05/2017  Admission Diagnoses: Patient Active Problem List   Diagnosis Date Noted  . Sepsis ([place])   . Hypoxemia   . Pneumonia of left lower lobe due to infectious organism ([place])   . Mediastinal lymphadenopathy   . Chronic cough 11/23/2017  . CLL (chronic lymphocytic leukemia) ([place]) 11/23/2017  . Pleural effusion 11/23/2017  . HCAP (healthcare-associated pneumonia) 11/20/2017  . Anemia 11/20/2017  . Ischemic cardiomyopathy 09/01/2017  . Right hip pain   . Acute otitis media   . Chronic systolic CHF (congestive heart failure) ([place])   . Pressure injury of skin 07/20/2017  . Malignant otitis externa 07/19/2017  . Fall at home, initial encounter 07/19/2017  . S/P left THA, AA 05/12/2016  . LPRD (laryngopharyngeal reflux disease) 05/21/2015  . Other allergic rhinitis 05/21/2015  . Obesity 12/12/2014  . Upper airway cough syndrome 12/11/2014  . Cough variant asthma 07/27/2014  . Wheezing 06/20/2014  . Symptomatic bradycardia - s/p Biotronik (serial number [id number]) 02/08/2014  . Diabetes mellitus type 2, noninsulin dependent ([place])   . Edema of foot 03/06/2013  . Traumatic ecchymosis of right foot 03/06/2013  . Bone spur 12/30/2012  . Pain of toe of left foot 12/30/2012  . Hyperlipidemia 07/13/2012  . HTN (hypertension) 04/12/2012  . Sick sinus syndrome ([place]) 08/17/2011  . Permanent atrial fibrillation ([place]) 09/16/2010    Discharge Diagnoses:  Principal Problem:   HCAP (healthcare-associated pneumonia) Active Problems:   HTN (hypertension)   Hyperlipidemia   Diabetes mellitus type 2, noninsulin dependent ([facility])   LPRD (laryngopharyngeal reflux disease)   Other allergic rhinitis   Anemia   Chronic cough   CLL (chronic lymphocytic  leukemia) ([facility])   Pleural effusion   Pneumonia of left lower lobe due to infectious organism [facility])   Mediastinal lymphadenopathy   Hypoxemia   Sepsis ([facility])   S/P thoracotomy   Discharged Condition: good  HPI:   84 year old male with progressive decline in health since early 2019 from previous fall requiring placement in skilled nursing facility recently admitted with left lower lobe pneumonia with associated shortness of breath productive cough and fever.  On admission chest x-ray there is a area of consolidation in the left lower lung.  He was treated with antibiotics but developed a progressive left pleural effusion which is now quite extensive involving the entire left pleural space.  2 attempts at thoracentesis, the last being with ultrasound guidance, were unsuccessful at draining the effusion.  2 to 3 ounces of fluid removed yesterday show multiple white blood cells, cloudy appearance, elevated LDH and negative Gram stain.  Thoracic surgical evaluation for drainage of loculated effusion was requested.  Patient is currently on room air and has intermittent shortness of breath and pleuritic pain on the left side.  Patient has history of CLL, baseline white count greater than 30,000 Patient has a remote history of smoking. He has been treated as an outpatient by Dr. Melvyn Novas for pulmonary problems related to GERD.  He has diabetes. No prior history of thoracic trauma or pneumothorax. History of atrial fibrillation and pacemaker, on chronic Coumadin which has been held for the thoracenteses procedures  Hospital Course:   On 11/29/2017 [name]  underwent a video-assisted thoracoscopy for an empyema.  He tolerated the procedure well and was transferred to the surgical ICU for continued care.  He was extubated timely manner.  On postop day 1 he was hypotensive.  We resuscitated him with packed red blood cells and FFP.  Placed a central line for reliable access.  He  required reexploration on  11/30/2017 to remove clots in the pleural space.  Postop day 1 he remained in normal sinus rhythm.  His chest x-ray was stable.  He continued to put quite a bit of drainage out of his chest tubes therefore we continued the tubes.  He remained hemodynamically stable.  He continued to require aggressive pulmonary toilet with his incentive spirometer and flutter valve.  We initiated Mucinex for secretions.  Encouraged ambulation 3 times daily.  Postop day 2 he continued to progress.  We started to wean him off of his IV steroids.  We continued his chest tubes today due to output.  We will continue to hold his Coumadin due to bleeding problems.  His other cultures were negative therefore we continued Rocephin while he was inpatient. On the floor he continued to progress. He was ambulating with assistance, his chest tubes were out and his chest xray showed no pneumothorax and left base atelectasis. He continued to progress and did no require any supplemental oxygen at this time. He was deemed cleared for discharge.     Consults: Internal medicine  Significant Diagnostic Studies:  CLINICAL DATA:  Chest tube  EXAM: PORTABLE CHEST 1 VIEW  COMPARISON:  12/01/2017  FINDINGS: Left chest tubes remain in place, unchanged. No pneumothorax. Pacer is unchanged as is right central line. Mild cardiomegaly with vascular congestion and bibasilar atelectasis.  IMPRESSION: Stable cardiomegaly with vascular congestion and bibasilar atelectasis.  No pneumothorax.   Electronically Signed   By: Rolm Baptise M.D.   On: 12/02/2017 07:51   Treatments:  Needs dictated   Discharge Exam: Blood pressure 119/71, pulse 60, temperature 97.7 F (36.5 C), temperature source Oral, resp. rate (!) 28, height 5\' 10"  (1.778 m), weight 92.8 kg (204 lb 9.4 oz), SpO2 97 %.   General appearance: alert, cooperative and no distress Heart: regular rate and rhythm, S1, S2 normal, no murmur, click, rub or gallop Lungs:  clear to auscultation bilaterally Abdomen: soft, non-tender; bowel sounds normal; no masses,  no organomegaly Extremities: extremities normal, atraumatic, no cyanosis or edema Wound: clean and dry    Disposition: Discharge disposition: 01-Home or Self Care        Allergies as of 12/05/2017      Reactions   Altace [ramipril] Other (See Comments)   "throat felt like had a knot in it"   Codeine Nausea And Vomiting   Nausea and vomiting   Simvastatin Other (See Comments)   Leg aches      Medication List    STOP taking these medications   glipiZIDE 2.5 MG 24 hr tablet Commonly known as:  GLUCOTROL XL   JANUVIA 100 MG tablet Generic drug:  sitaGLIPtin     TAKE these medications   acetaminophen 500 MG tablet Commonly known as:  TYLENOL Take 2 tablets (1,000 mg total) by mouth every 6 (six) hours.   atorvastatin 20 MG tablet Commonly known as:  LIPITOR TAKE 1 TABLET BY MOUTH DAILY AT 6PM   carvedilol 3.125 MG tablet Commonly known as:  COREG Take 1 tablet (3.125 mg total) by mouth 2 (two) times daily.   cephALEXin 500 MG capsule  Commonly known as:  KEFLEX Take 1 capsule (500 mg total) by mouth 3 (three) times daily.   fenofibrate micronized 200 MG capsule Commonly known as:  LOFIBRA TAKE 1 CAPSULE BY MOUTH DAILY BEFORE BREAKFAST   furosemide 20 MG tablet Commonly known as:  LASIX Take 1 tablet (20 mg total) by mouth daily as needed.   guaiFENesin 600 MG 12 hr tablet Commonly known as:  MUCINEX Take 1 tablet (600 mg total) by mouth 2 (two) times daily for 7 days.   metFORMIN 1000 MG tablet Commonly known as:  GLUCOPHAGE Take 1,000 mg by mouth 2 (two) times daily with a meal.   multivitamin per tablet Take 1 tablet by mouth daily. Will stop prior to procedure   oxyCODONE 5 MG immediate release tablet Commonly known as:  Oxy IR/ROXICODONE Take 1 tablet (5 mg total) by mouth every 6 (six) hours as needed for severe pain.   vitamin B-12 1000 MCG  tablet Commonly known as:  CYANOCOBALAMIN Take 1,000 mcg by mouth daily. Will stop prior to procedure   warfarin 5 MG tablet Commonly known as:  COUMADIN Take as directed. If you are unsure how to take this medication, talk to your nurse or doctor. Original instructions:  TAKE AS DIRECTED BY COUMADIN CLINIC What changed:    how much to take  how to take this  when to take this  additional instructions      Follow-up Information    [facility] Follow up.   Specialty:  Rehabilitation Why:  resume Contact information: [address] [place] [phone]       Ivin Poot, MD Follow up.   Specialty:  Cardiothoracic Surgery Why:  Your follow-up appointment is on December 22, 2016 at 4:30 PM.  Please arrive at 4 PM for a follow-up chest x-ray located at [facility] imaging which is in the first floor of our building. Contact information: [place] [address] [phone]        [name], MD. Call in 1 day(s).   Specialty:  Internal Medicine Contact information: [name] [address] [phone]        Nahser, Wonda Cheng, MD .   Specialty:  Cardiology Contact information: [place]  [address] [phone]          The patient has been discharged on:    Signed: Elgie Collard 12/05/2017, 10:04 AM  patient examined and medical record reviewed,agree with above note. Tharon Aquas Trigt III 12/24/2017

## 2017-12-02 NOTE — Evaluation (Signed)
Physical Therapy Re-Evaluation Patient Details Name: Oscar Baker MRN: [id number] DOB: 02-08-1933 Today's Date: 12/02/2017   History of Present Illness  84 y.o. male admitted with SOB found to have left lower lobe pneumonia with associated effusion. Thoracentesis attempted 6/6, again 6/7, video assisted thoracoscopy/empyema left 6/10 due to persistent pleural effusion. PMH includes: CLL, prostate cancer, sick sinus syndrome, pacemaker, paroxysmal a fib, CHF with ejection fraction 45%, B THA, L TKA, back surgery.     Clinical Impression  Pt admitted with above diagnosis. Pt currently with functional limitations due to the deficits listed below (see PT Problem List). PTA, pt living with wife, modified independent with all mobility utilizing RW for community distances, going to OP PT for balance. Upon re eval pt ambulating unit with RW and stand by assistance on RA. Pt tele alarming unsustained runs of vtach towards end of visit, RN notified. HR in 70s, pt asymptomatic during session. Anticipate pt will progress well enough to return home and continue OP therapy, will update recs closer to d/c if appropriate.  Pt will benefit from skilled PT to increase their independence and safety with mobility to allow discharge to the venue listed below.         Follow Up Recommendations Outpatient PT;Supervision/Assistance - 24 hour    Equipment Recommendations  (pt came with RW and it has been lost in hospital, may need 1)    Recommendations for Other Services OT consult     Precautions / Restrictions Precautions Precautions: Fall Restrictions Weight Bearing Restrictions: No      Mobility  Bed Mobility Overal bed mobility: Needs Assistance             General bed mobility comments: Assistance to help patients legs back over EOb  Transfers Overall transfer level: Needs assistance Equipment used: Rolling walker (2 wheeled) Transfers: Sit to/from Stand Sit to Stand: Min guard         General transfer comment: Min guard for safety  Ambulation/Gait Ambulation/Gait assistance: Min guard;Supervision Gait Distance (Feet): 550 Feet Assistive device: Rolling walker (2 wheeled) Gait Pattern/deviations: Step-through pattern;Decreased stride length;Trunk flexed;Shuffle Gait velocity: decreased   General Gait Details: patient ambulating with RW, step through pattern. no c/o of chest pain or coughing during session. towards end reports fatigue, SpO2 88% after activity on RA.   Stairs            Wheelchair Mobility    Modified Rankin (Stroke Patients Only)       Balance Overall balance assessment: Needs assistance Sitting-balance support: No upper extremity supported;Feet supported Sitting balance-Leahy Scale: Fair     Standing balance support: Bilateral upper extremity supported Standing balance-Leahy Scale: Fair Standing balance comment: able to statically stand before reaching to RW                             Pertinent Vitals/Pain Pain Assessment: Faces Faces Pain Scale: Hurts a little bit Pain Location: L flank from coughing Pain Descriptors / Indicators: Sore;Throbbing Pain Intervention(s): Limited activity within patient's tolerance;Monitored during session;Premedicated before session;Repositioned    Home Living Family/patient expects to be discharged to:: Private residence Living Arrangements: Spouse/significant other Available Help at Discharge: Family;Available PRN/intermittently Type of Home: Apartment Home Access: Level entry     Home Layout: One level Home Equipment: Grab bars - toilet;Grab bars - tub/shower;Walker - 2 wheels Additional Comments: 2    Prior Function Level of Independence: Independent  Hand Dominance        Extremity/Trunk Assessment   Upper Extremity Assessment Upper Extremity Assessment: Overall WFL for tasks assessed    Lower Extremity Assessment Lower Extremity Assessment:  Overall WFL for tasks assessed       Communication   Communication: HOH  Cognition Arousal/Alertness: Awake/alert Behavior During Therapy: WFL for tasks assessed/performed Overall Cognitive Status: Within Functional Limits for tasks assessed                                        General Comments      Exercises     Assessment/Plan    PT Assessment Patient needs continued PT services  PT Problem List Decreased strength;Decreased balance;Decreased mobility;Cardiopulmonary status limiting activity;Pain;Decreased safety awareness       PT Treatment Interventions DME instruction;Gait training;Functional mobility training;Therapeutic activities;Therapeutic exercise;Balance training;Cognitive remediation;Patient/family education    PT Goals (Current goals can be found in the Care Plan section)  Acute Rehab PT Goals Patient Stated Goal: go home when ready PT Goal Formulation: With patient Time For Goal Achievement: 12/06/17 Potential to Achieve Goals: Fair    Frequency Min 3X/week   Barriers to discharge        Co-evaluation               AM-PAC PT "6 Clicks" Daily Activity  Outcome Measure Difficulty turning over in bed (including adjusting bedclothes, sheets and blankets)?: Unable Difficulty moving from lying on back to sitting on the side of the bed? : Unable Difficulty sitting down on and standing up from a chair with arms (e.g., wheelchair, bedside commode, etc,.)?: Unable Help needed moving to and from a bed to chair (including a wheelchair)?: A Little Help needed walking in hospital room?: A Little Help needed climbing 3-5 steps with a railing? : A Little 6 Click Score: 12    End of Session Equipment Utilized During Treatment: Gait belt Activity Tolerance: Patient tolerated treatment well Patient left: in bed;with call bell/phone within reach;with nursing/sitter in room;with family/visitor present Nurse Communication: Mobility  status;Patient requests pain meds PT Visit Diagnosis: Unsteadiness on feet (R26.81);Other abnormalities of gait and mobility (R26.89);Muscle weakness (generalized) (M62.81);Difficulty in walking, not elsewhere classified (R26.2);Repeated falls (R29.6);History of falling (Z91.81);Pain Pain - part of body: (L chest)    Time: 1430-1500 PT Time Calculation (min) (ACUTE ONLY): 30 min   Charges:   PT Evaluation $PT Re-evaluation: 1 Re-eval PT Treatments $Gait Training: 8-22 mins   PT G Codes:        Reinaldo Berber, PT, DPT Acute Rehab Services Pager: [phone]   Reinaldo Berber 12/02/2017, 3:10 PM

## 2017-12-02 NOTE — Progress Notes (Addendum)
TCTS DAILY ICU PROGRESS NOTE                   [place]            ,[place] [address]          [phone]   2 Days Post-Op Procedure(s) (LRB): DRAINAGE OF HEMOTHORAX (Left) VIDEO ASSISTED THORACOSCOPY (Left)  Total Length of Stay:  LOS: 12 days   Subjective: Feels okay this morning.   Objective: Vital signs in last 24 hours: Temp:  [97.8 F (36.6 C)-98.7 F (37.1 C)] 98 F (36.7 C) (06/13 0809) Pulse Rate:  [42-81] 65 (06/13 0800) Cardiac Rhythm: Ventricular paced (06/13 0800) Resp:  [14-40] 14 (06/13 0800) BP: (108-145)/(48-72) 125/71 (06/13 0800) SpO2:  [82 %-100 %] 98 % (06/13 0851)  Filed Weights   11/21/17 0537 11/30/17 1525 12/01/17 0600  Weight: 63 kg (138 lb 12.8 oz) 92.1 kg (203 lb 0.7 oz) 95.5 kg (210 lb 9.6 oz)    Weight change:    Hemodynamic parameters for last 24 hours:    Intake/Output from previous day: 06/12 0701 - 06/13 0700 In: 1359.6 [P.O.:800; I.V.:286.3; IV Piggyback:273.3] Out: 3580 [Urine:3360; Chest Tube:220]  Intake/Output this shift: Total I/O In: -  Out: 100 [Urine:100]  Current Meds: Scheduled Meds: . acetaminophen  1,000 mg Oral Q6H   Or  . acetaminophen (TYLENOL) oral liquid 160 mg/5 mL  1,000 mg Oral Q6H  . atorvastatin  20 mg Oral q1800  . bisacodyl  10 mg Oral Daily  . Chlorhexidine Gluconate Cloth  6 each Topical Daily  . furosemide  20 mg Intravenous BID  . guaiFENesin  600 mg Oral BID  . insulin aspart  0-15 Units Subcutaneous TID WC  . insulin aspart  0-5 Units Subcutaneous QHS  . levalbuterol  0.63 mg Nebulization TID  . methylPREDNISolone (SOLU-MEDROL) injection  20 mg Intravenous Daily  . metoCLOPramide (REGLAN) injection  10 mg Intravenous Q6H  . senna-docusate  1 tablet Oral QHS  . sodium chloride flush  10-40 mL Intracatheter Q12H   Continuous Infusions: . cefTRIAXone (ROCEPHIN)  IV Stopped (12/01/17 1946)  . dextrose 5 % and 0.45% NaCl 10 mL/hr at 12/01/17 2000  . potassium  chloride     PRN Meds:.fentaNYL (SUBLIMAZE) injection, ondansetron (ZOFRAN) IV, oxyCODONE, potassium chloride, sodium chloride flush, traMADol  General appearance: alert, cooperative and no distress Heart: regular rate and rhythm, S1, S2 normal, no murmur, click, rub or gallop Lungs: clear to auscultation bilaterally Abdomen: soft, non-tender; bowel sounds normal; no masses,  no organomegaly Extremities: extremities normal, atraumatic, no cyanosis or edema Wound: clean and dry  Lab Results: CBC: Recent Labs    12/01/17 0430 12/02/17 0419  WBC 30.1* 30.4*  HGB 8.5* 9.1*  HCT 25.5* 28.8*  PLT 333 359   BMET:  Recent Labs    12/01/17 2000 12/01/17 2335 12/02/17 0419  NA 136  --  139  K 6.2* 4.3 4.2  CL 102  --  102  CO2 27  --  30  GLUCOSE 334*  --  181*  BUN 27*  --  20  CREATININE 1.02  --  0.83  CALCIUM 7.9*  --  8.1*    CMET: Lab Results  Component Value Date   WBC 30.4 (H) 12/02/2017   HGB 9.1 (L) 12/02/2017   HCT 28.8 (L) 12/02/2017   PLT 359 12/02/2017   GLUCOSE 181 (H) 12/02/2017   CHOL 145 05/28/2015   TRIG 107 05/28/2015  HDL 23 (L) 05/28/2015   LDLDIRECT 133.8 07/13/2012   LDLCALC 101 05/28/2015   ALT 33 12/02/2017   AST 36 12/02/2017   NA 139 12/02/2017   K 4.2 12/02/2017   CL 102 12/02/2017   CREATININE 0.83 12/02/2017   BUN 20 12/02/2017   CO2 30 12/02/2017   TSH 0.436 07/22/2017   INR 1.40 11/30/2017   HGBA1C 6.0 (H) 05/05/2016      PT/INR:  Recent Labs    11/30/17 0634  LABPROT 17.1*  INR 1.40   Radiology: Dg Chest Port 1 View  Result Date: 12/02/2017 CLINICAL DATA:  Chest tube EXAM: PORTABLE CHEST 1 VIEW COMPARISON:  12/01/2017 FINDINGS: Left chest tubes remain in place, unchanged. No pneumothorax. Pacer is unchanged as is right central line. Mild cardiomegaly with vascular congestion and bibasilar atelectasis. IMPRESSION: Stable cardiomegaly with vascular congestion and bibasilar atelectasis. No pneumothorax. Electronically  Signed   By: Rolm Baptise M.D.   On: 12/02/2017 07:51   Dg Chest Port 1 View  Result Date: 12/01/2017 CLINICAL DATA:  Status post VATS for empyema 11/29/2017. Chest tube in place. EXAM: PORTABLE CHEST 1 VIEW COMPARISON:  Single-view of the chest 11/30/2017 and 11/29/2017. FINDINGS: Right subclavian central venous catheter and 2 left chest tubes remain in place. Small left pleural effusion and basilar airspace disease are unchanged. No pneumothorax. Subsegmental atelectasis in the right base has improved since yesterday's examination. Heart size is mildly enlarged. Aortic atherosclerosis is noted. IMPRESSION: Negative for pneumothorax with left chest tubes in place. No change in a small left effusion and basilar airspace disease. Improved right basilar subsegmental atelectasis. Electronically Signed   By: Inge Rise M.D.   On: 12/01/2017 09:36     Assessment/Plan: S/P Procedure(s) (LRB): DRAINAGE OF HEMOTHORAX (Left) VIDEO ASSISTED THORACOSCOPY (Left)   1. CV-NSR in the 60s. BP well controlled. Continue Lipitor.  2. Pulm-CXR today stable. Chest tubes put out 276ml/24 hours-keep.  3. Renal-creatinine 0.83, decreasing. Electrolytes okay.  4. H and H 9.1/28.8 this morning. Platelets 359k 5. Endo-blood glucose with moderate control-improved over the last 24 hours. On three oral agents at home. Continue SSI for now. 6. ID- WBC 30.4, afebrile. On Rocephin.   Plan: Continue chest tubes. CXR appears better. Continue incentive spirometry, flutter valve use, and nebs.     Elgie Collard 12/02/2017 9:23 AM   Getting stronger, ambulating but still with wet cough- using meta-neb, flutter valve and mucinex CXR ok Will DC anterior chest tube tomorrow Cont iv lasix for now Wean off iv steroids Was on preop coumadin for chronic afib- hold coumadin for now because of bleeding problems , cover with low dose lovenox OR cultures negative- cont Rocephin iv while in hospital patient examined and  medical record reviewed,agree with above note. Tharon Aquas Trigt III 12/02/2017

## 2017-12-02 NOTE — Op Note (Signed)
NAME: Oscar Baker, WOJTKIEWICZ MEDICAL RECORD ON:[id number] ACCOUNT [id number] DATE OF BIRTH:1932-07-10 FACILITY: [facility] LOCATION: [facility] PHYSICIAN:Paeton Latouche VAN TRIGT III, MD  OPERATIVE REPORT  DATE OF PROCEDURE:  11/29/2017  PROCEDURE PERFORMED: 1.  Left VATS (video-assisted thoracoscopic surgery) and drainage of loculated empyema. 2.  Decortication of left lower lobe.  SURGEON:  Len Childs, MD  ASSISTANT:  Nicholes Rough, PA-C  PREOPERATIVE DIAGNOSIS:  Left lower lobe pneumonia with empyema.  POSTOPERATIVE DIAGNOSIS:  Left lower lobe pneumonia with empyema.  ANESTHESIA:  General.  CLINICAL NOTE:  The patient is an 84 year old gentleman who has had respiratory difficulties for several weeks with 3 hospitalizations.  He was recently hospitalized with a diagnosis of left lower lobe pneumonia and developed a loculated pleural effusion  which was significant by CT scan.  Thoracic surgical evaluation was requested, and I recommended VATS for drainage of the empyema and decortication as the only long-term option to resolve his recurrent thoracic infection.  I discussed the procedure of  left VATS with drainage of empyema in detail with the patient and his wife including the use of general anesthesia, the location of the surgical incision, and the expected postoperative recovery.  I reviewed with the patient the risks of the surgery  including the risks of bleeding, blood transfusion, recurrent infection, prolonged air leak, recurrent pneumonia, organ failure, and death.  After reviewing these issues, he demonstrated his understanding and agreed to proceed with surgery on what I felt  was informed consent.  OPERATIVE FINDINGS: 1.  A large multiloculated pleural effusion. 2.  Fibrinous thick peel on the left lower lobe successfully removed by decortication. 3.  Minimal blood loss.  DESCRIPTION OF PROCEDURE:  The patient was brought to the operating room and placed supine on the operating table.   General anesthesia was induced under invasive hemodynamic monitoring.  Previously, the documentation of surgical consent was obtained, and  the proper site marked.  The patient was turned in the left side up, and the left chest was prepped and draped as a sterile field.  A proper time-out was performed.  An incision was made anterior to the tip of the scapula and the fifth interspace  approximately 6 cm long.  The VATS camera was inserted, and the pleural space was obliterated with adhesions.  An incision was then carried through the chest wall into the pleural space where careful dissection and mobilization allowed entry and exposure  to the pleural space.  The loculated effusion was removed, and the loculated adhesions all opened up.  The material was glue-like, and cloudy fluid was sent for culture.  The peel in the lower lobe was then removed using a decortication technique to  develop a plane between the lung and the fibrous peel, and this was removed and sent for pathology and culture as well.  There appeared to be minimal air leak from the surface of the lung.  The hemithorax was irrigated with saline.  Two chest tubes were  placed to drain the anterior and posterior pleural space and brought out through separate incisions.  Hemostasis was adequate.  The pericostal sutures were placed, and before they were tied, the left lung was ventilated and the lung completely expanded.   The pericostal sutures were tied.  The chest wall was closed in layers using Vicryl for the muscular layers, a running Vicryl was used for the subcutaneous and skin, and sterile dressings were applied.  The chest tubes were connected to an underwater  seal Pleur-Evac drainage  system.  The patient was turned supine, reversed from anesthesia, extubated and returned to recovery room in stable condition.  LN/NUANCE  D:12/02/2017 T:12/02/2017 JOB:[id number]

## 2017-12-02 NOTE — Op Note (Signed)
NAME: Oscar Baker, SCHRIEBER MEDICAL RECORD [id number] ACCOUNT [id number] DATE OF BIRTH:1933/02/03 FACILITY: [facility] LOCATION: [facility] PHYSICIAN:Inetta Dicke VAN TRIGT III, MD  OPERATIVE REPORT  DATE OF PROCEDURE:  11/30/2017  PROCEDURE PERFORMED:  Left video-assisted thoracoscopic surgery, reexploration of the chest and evacuation of hematoma.  SURGEON:  Len Childs, MD  ASSISTANT:  Nicholes Rough, PA-C  ANESTHESIA:  General.  PREOPERATIVE DIAGNOSES:  Bleeding following left video-assisted thoracoscopic surgery for drainage of empyema and decortication of the left lower lobe with a hemothorax.  POSTOPERATIVE DIAGNOSES:  Bleeding following left video-assisted thoracoscopic surgery for drainage of empyema and decortication of the left lower lobe with a hemothorax.  CLINICAL NOTE:  The patient is an 84 year old gentleman who presented with left lower lobe pneumonia and empyema.  It was clear that antibiotics alone would not resolve the problem because of a large loculated empyema with entrapment of the left lower  lobe.  He underwent successful drainage of the empyema and decortication of the left lower lobe.  On the night of that operation, he started having some mild chest tube output and then evidence of a drop in hemoglobin.  X-ray performed after midnight  showed evidence of a hemothorax which did not adequately drain through the 2 previously placed chest tubes.  His blood pressure remained stable after transfusion, and I recommended reexploration of the chest when I first saw him the following morning and  discussed the procedure with the patient's family.  At the time of surgery, there was a moderate amount of clotted blood which appeared to come from the chest wall as there is no active source of bleeding from the lung.  The patient tolerated the procedure.  DESCRIPTION OF PROCEDURE:  The patient was brought from preoperative holding to the operating room and placed supine on the operating  table.  General anesthesia was induced.  The patient's consent had previously been documented and the proper site  recorded.  The patient was turned left side up, and the left chest was prepped and draped after the previously placed chest tubes were removed.  A proper timeout was performed.  The previous chest incision was opened.  There was a moderate amount of  clotted blood anterior to the lung as well as in the sub-tunnel pulmonic space.  This clotted blood was removed, and the entire hemothorax irrigated.  No active bleeding from the lung of the site of the previous decortication was found.  The intercostal  muscles in the ribs involved in the incision were not bleeding.  The chest tube tunnels were not actively bleeding, but it was felt that they were probably the source, and these areas were cauterized.  The new drains were placed through a different  tunnel through the chest wall and positioned anteriorly and posteriorly in the left pleural space.  They were secured to the skin.  There was no bleeding.  Next, the ribs were reapproximated with intercostal sutures.  There was no bleeding from these  sutures.  The lung was reexpanded before the intercostal sutures were tightened, and the ribs were reapproximated.  The chest wall was reapproximated with interrupted Vicryl for the muscular layers and running Vicryl for the subcutaneous and skin layers.  A sterile dressing was applied and the chest tubes were connected to an underwater steel Pleur-Evac drainage  system.  The patient was turned supine, reversed from anesthesia, extubated, and returned to recovery room in stable condition.  LN/NUANCE  D:12/02/2017 T:12/02/2017 JOB:[id number]

## 2017-12-02 NOTE — Discharge Instructions (Signed)
Video-Assisted Thoracic Surgery, Care After ° °This sheet gives you information about how to care for yourself after your procedure. Your health care provider may also give you more specific instructions. If you have problems or questions, contact your health care provider. °What can I expect after the procedure? °After the procedure, it is common to have: °· Some pain and soreness in your chest. °· Pain when breathing in (inhaling) and coughing. °· Constipation. °· Fatigue. °· Difficulty sleeping. ° °Follow these instructions at home: °Preventing pneumonia °· Take deep breaths or do breathing exercises as instructed by your health care provider. Doing this helps prevent lung infection (pneumonia). °· Cough frequently. Coughing may cause discomfort, but it is important to clear mucus (phlegm) and expand your lungs. If it hurts to cough, hold a pillow against your chest or place the palms of both hands on top of the incision (use splinting) when you cough. This may help relieve discomfort. °· If you were given an incentive spirometer, use it as directed. An incentive spirometer is a tool that measures how well you are filling your lungs with each breath. °· Participate in pulmonary rehabilitation as directed by your health care provider. This is a program that combines education, exercise, and support from a team of specialists. The goal is to help you heal and get back to your normal activities as soon as possible. °Medicines °· Take over-the-counter or prescription medicines only as told by your health care provider. °· If you have pain, take pain-relieving medicine before your pain becomes severe. This is important because if your pain is under control, you will be able to breathe and cough more comfortably. °· If you were prescribed an antibiotic medicine, take it as told by your health care provider. Do not stop taking the antibiotic even if you start to feel better. °Activity °· Ask your health care provider  what activities are safe for you. °· Avoid activities that use your chest muscles for at least 3-4 weeks. °· Do not lift anything that is heavier than 10 lb (4.5 kg), or the limit that your health care provider tells you, until he or she says that it is safe. °Incision care °· Follow instructions from your health care provider about how to take care of your incision(s). Make sure you: °? Wash your hands with soap and water before you change your bandage (dressing). If soap and water are not available, use hand sanitizer. °? Change your dressing as told by your health care provider. °? Leave stitches (sutures), skin glue, or adhesive strips in place. These skin closures may need to stay in place for 2 weeks or longer. If adhesive strip edges start to loosen and curl up, you may trim the loose edges. Do not remove adhesive strips completely unless your health care provider tells you to do that. °· Keep your dressing dry until it has been removed. °· Check your incision area every day for signs of infection. Check for: °? Redness, swelling, or pain. °? Fluid or blood. °? Warmth. °? Pus or a bad smell. °Bathing °· Do not take baths, swim, or use a hot tub until your health care provider approves. You may take showers. °· After your dressing has been removed, use soap and water to gently wash your incision area. Do not use anything else to clean your incision(s) unless your health care provider tells you to do this. °Driving °· Do not drive until your health care provider approves. °· Do not drive   or use heavy machinery while taking prescription pain medicine. °Eating and drinking °· Eat a healthy, balanced diet as instructed by your health care provider. A healthy diet includes plenty of fresh fruits and vegetables, whole grains, and low-fat (lean) proteins. °· Limit foods that are high in fat and processed sugars, such as fried and sweet foods. °· Drink enough fluid to keep your urine clear or pale yellow. °General  instructions °· To prevent or treat constipation while you are taking prescription pain medicine, your health care provider may recommend that you: °? Take over-the-counter or prescription medicines. °? Eat foods that are high in fiber, such as beans, fresh fruits and vegetables, and whole grains. °· Do not use any products that contain nicotine or tobacco, such as cigarettes and e-cigarettes. If you need help quitting, ask your health care provider. °· Avoid secondhand smoke. °· Wear compression stockings as told by your health care provider. These stockings help to prevent blood clots and reduce swelling in your legs. °· If you have a chest tube, care for it as instructed by your health care provider. Do not travel by airplane during the 2 weeks after your chest tube is removed, or until your health care provider says that this is safe. °· Keep all follow-up visits as told by your health care provider. This is important. °Contact a health care provider if: °· You have redness, swelling, or pain around an incision. °· You have fluid or blood coming from an incision. °· Your incision area feels warm to the touch. °· You have pus or a bad smell coming from an incision. °· You have a fever or chills. °· You have nausea or vomiting. °· You have pain that does not get better with medicine. °Get help right away if: °· You have chest pain. °· Your heart is fluttering or beating rapidly. °· You develop a rash. °· You have shortness of breath or trouble breathing. °· You are confused. °· You have trouble speaking. °· You feel weak, light-headed, or dizzy. °· You faint. °Summary °· To help prevent lung infection (pneumonia), take deep breaths or do breathing exercises as instructed by your health care provider. °· Cough frequently to clear mucus (phlegm) and expand your lungs. If it hurts to cough, hold a pillow against your chest or place the palms of both hands on top of the incision (use splinting) when you cough. °· If  you have pain, take pain-relieving medicine before your pain becomes severe. This is important because if your pain is under control, you will be able to breathe and cough more comfortably. °· Ask your health care provider what activities are safe for you. °This information is not intended to replace advice given to you by your health care provider. Make sure you discuss any questions you have with your health care provider. °Document Released: 10/03/2012 Document Revised: 05/18/2016 Document Reviewed: 05/18/2016 °Elsevier Interactive Patient Education © 2017 [employer] ° °

## 2017-12-02 NOTE — Progress Notes (Signed)
Inpatient Diabetes Program Recommendations  AACE/ADA: New Consensus Statement on Inpatient Glycemic Control (2015)  Target Ranges:  Prepandial:   less than 140 mg/dL      Peak postprandial:   less than 180 mg/dL (1-2 hours)      Critically ill patients:  140 - 180 mg/dL   Lab Results  Component Value Date   GLUCAP 354 (H) 12/02/2017   HGBA1C 6.0 (H) 05/05/2016    Review of Glycemic Control Results for Oscar Baker, Oscar Baker (MRN [id number]) as of 12/02/2017 13:23  Ref. Range 12/01/2017 15:42 12/01/2017 22:02 12/02/2017 06:43 12/02/2017 11:38  Glucose-Capillary Latest Ref Range: 65 - 99 mg/dL 333 (H) 297 (H) 165 (H) 354 (H)   Diabetes history: Type 2 DM Outpatient Diabetes medications: Glipizide 2.5 mg QD, Metformin 1000 mg BID, Januvia 100 mg QD Current orders for Inpatient glycemic control: Novolog 0-24 units Q4H, Solumedrol 20 mg QD  Inpatient Diabetes Program Recommendations:    Per hospitalist note: "continue Lantus", but no orders for Lantus.  At this time, consider adding back Lantus 14 units QHS.  Post prandials exceeding 180 mg/dL, also consider adding meal coverage: Novolog 4 units TID (assuming patient is consuming >50% of meal). Thanks, Bronson Curb, MSN, RNC-OB Diabetes Coordinator [phone] (8a-5p)

## 2017-12-02 NOTE — Progress Notes (Signed)
Patient Demographics:    Oscar Baker, is a 84 y.o. male, DOB - 1932-08-05, BHA:[id number]  Admit date - 11/20/2017   Admitting Physician Jani Gravel, MD  Outpatient Primary MD for the patient is [name], MD  LOS - 12   Chief Complaint  Patient presents with  . Cough  . Fever        Subjective:    Oscar Baker is awake and talkative,, patient's wife at bedside, ate well, no BM  Assessment  & Plan :    Principal Problem:   HCAP (healthcare-associated pneumonia) Active Problems:   HTN (hypertension)   Hyperlipidemia   Diabetes mellitus type 2, noninsulin dependent (HCC)   LPRD (laryngopharyngeal reflux disease)   Other allergic rhinitis   Anemia   Chronic cough   CLL (chronic lymphocytic leukemia) (HCC)   Pleural effusion   Pneumonia of left lower lobe due to infectious organism ([place])   Mediastinal lymphadenopathy   Hypoxemia   Sepsis ([place])   S/P thoracotomy               Brief Narrative:     84 year-old male with a history of CLL, prostate cancer, sick sinus syndrome, pacemaker, paroxysmal atrial fibrillation, CHF with ejection fraction 45% admitted with fever increasing shortness of breath cough with yellow to green phlegm for 1 week prior to admission. He was found to have left lower lobe pneumonia with associated effusion and is admitted for the treatment of the same.  Thoracentesis was attempted on 11/25/2017 by pulmonology service but had to be aborted due to coughing.  IR did thoracentesis on 11/26/2017 with only 70 cc fluid obtained.  CT surgery consulted , for VIDEO ASSISTED THORACOSCOPY (VATS)/EMPYEMA (Left) on 11/29/17 due to persistent pleural effusion despite 2 failed attempts at thoracentesis with minimal fluid drained.    Plan:- 1)HCAP/Haemophilus influenza Bacteremia complicated with loculated Lt Pleural Effusion, CT surgery consulted , had Lt sided VATS on 11/29/17, developed  significant hemothorax post VATS, went back to OR for exploration and drainage of hemothorax on 11/30/17, 32 F chest tube placed, left-sided chest tubes appear to be functioning as expected, continue IV Rocephin (started 11/21/17),  repeat blood cultures negative so far, persistent leukocytosis may be secondary to CLL and steroids.  Pleural fluid cultures remains negative so far, Lt sided CT with output of 281m/24 hours  2)PAFib-Coumadin on hold since 11/27/2017 to allow for VATS (patient developed hemothorax postop--- so Coumadin is still on hold), Coreg is on hold due to soft BP,   3)HFrEF-last known EF about 45 to 50% from January 2019, continue Lasix 20 mg daily, Coreg on hold due to soft BP  4)CLL--persistent leukocytosis mostly secondary to underlying CLL  5)DM-glycemic control worsened with steroids, restart Lantus at 12 units nightly, use Novolog/Humalog Sliding scale insulin with Accu-Cheks/Fingersticks as ordered, IV Solu-Medrol discontinued patient being placed on p.o. prednisone   6)Acute Blood Loss Anemia due to large left-sided hemothorax in the setting of VAT procedure, received 3 units of packed cells on 11/29/2017, received 4 units of FFP on 11/29/2017, also received 2 units of packed cells on 11/30/2017, received 1 unit of FFP on 11/30/2017, and then received one platelet transfusion on 11/30/2017, posttransfusion hemoglobin is 9.1,  clinically patient looks  hemodynamically stable  7)Social/Ethics/Code Status : I have discussed the patient's wishes with the patient's wife Ms Jayceion Lisenby  and pt's Brother [name] at bedside -- Pt is a FULL CODE, they Desire/Request  life sustaining measures be used and that artificial means of life support  be employed including cardioversion, mechanical ventilation, chest compressions, pressors, and CPR as indicated  Disposition Plan  : TBD  Consults  :  CT surgery, PCCM/IR  Procedures 11/29/17--- Lt VATS 11/29/17--- right subclavian central line  placement 11/30/17--evacuation of Lt hemothorax   DVT Prophylaxis  : Coumadin on hold to allow for VATs/hemothorax  Lab Results  Component Value Date   PLT 359 12/02/2017    Inpatient Medications  Scheduled Meds: . acetaminophen  1,000 mg Oral Q6H   Or  . acetaminophen (TYLENOL) oral liquid 160 mg/5 mL  1,000 mg Oral Q6H  . atorvastatin  20 mg Oral q1800  . bisacodyl  10 mg Oral Daily  . Chlorhexidine Gluconate Cloth  6 each Topical Daily  . feeding supplement (ENSURE ENLIVE)  237 mL Oral TID WC  . furosemide  20 mg Intravenous BID  . guaiFENesin  600 mg Oral BID  . insulin aspart  0-15 Units Subcutaneous TID WC  . insulin aspart  0-5 Units Subcutaneous QHS  . insulin glargine  12 Units Subcutaneous QHS  . levalbuterol  0.63 mg Nebulization TID  . metoCLOPramide (REGLAN) injection  10 mg Intravenous Q6H  . [START ON 12/03/2017] predniSONE  20 mg Oral Q breakfast  . senna-docusate  2 tablet Oral QHS  . sodium chloride flush  10-40 mL Intracatheter Q12H  . sorbitol  30 mL Oral Once   Continuous Infusions: . cefTRIAXone (ROCEPHIN)  IV Stopped (12/01/17 1946)  . dextrose 5 % and 0.45% NaCl 10 mL/hr at 12/01/17 2000  . potassium chloride     PRN Meds:.fentaNYL (SUBLIMAZE) injection, ondansetron (ZOFRAN) IV, oxyCODONE, potassium chloride, sodium chloride flush, traMADol    Anti-infectives (From admission, onward)   Start     Dose/Rate Route Frequency Ordered Stop   11/30/17 1830  cefTRIAXone (ROCEPHIN) 2 g in sodium chloride 0.9 % 100 mL IVPB     2 g 200 mL/hr over 30 Minutes Intravenous Every 24 hours 11/30/17 1811     11/29/17 2200  vancomycin (VANCOCIN) 500 mg in sodium chloride 0.9 % 100 mL IVPB  Status:  Discontinued     500 mg 100 mL/hr over 60 Minutes Intravenous Every 12 hours 11/29/17 2157 11/30/17 1357   11/29/17 2200  piperacillin-tazobactam (ZOSYN) IVPB 3.375 g  Status:  Discontinued     3.375 g 12.5 mL/hr over 240 Minutes Intravenous Every 8 hours 11/29/17  2157 11/30/17 1357   11/29/17 1300  ceFAZolin (ANCEF) IVPB 2g/100 mL premix     2 g 200 mL/hr over 30 Minutes Intravenous 30 min pre-op 11/28/17 1215 11/29/17 1722   11/21/17 1600  cefTRIAXone (ROCEPHIN) 2 g in sodium chloride 0.9 % 100 mL IVPB  Status:  Discontinued     2 g 200 mL/hr over 30 Minutes Intravenous Every 24 hours 11/21/17 1553 11/29/17 2135   11/21/17 0600  vancomycin (VANCOCIN) IVPB 750 mg/150 ml premix  Status:  Discontinued     750 mg 150 mL/hr over 60 Minutes Intravenous Every 12 hours 11/20/17 1703 11/21/17 1553   11/21/17 0230  ceFEPIme (MAXIPIME) 1 g in sodium chloride 0.9 % 100 mL IVPB  Status:  Discontinued     1 g 200 mL/hr over 30  Minutes Intravenous Every 8 hours 11/20/17 1835 11/21/17 1553   11/20/17 1930  azithromycin (ZITHROMAX) 500 mg in sodium chloride 0.9 % 250 mL IVPB  Status:  Discontinued     500 mg 250 mL/hr over 60 Minutes Intravenous Every 24 hours 11/20/17 1926 11/21/17 1553   11/20/17 1715  vancomycin (VANCOCIN) 2,000 mg in sodium chloride 0.9 % 500 mL IVPB     2,000 mg 250 mL/hr over 120 Minutes Intravenous  Once 11/20/17 1703 11/20/17 2055   11/20/17 1700  ceFEPIme (MAXIPIME) 1 g in sodium chloride 0.9 % 100 mL IVPB     1 g 200 mL/hr over 30 Minutes Intravenous  Once 11/20/17 1657 11/20/17 1854        Objective:   Vitals:   12/02/17 0851 12/02/17 0900 12/02/17 1000 12/02/17 1136  BP:  102/75 (!) 102/58   Pulse:  70 62   Resp:  19 (!) 22   Temp:    98.4 F (36.9 C)  TempSrc:    Oral  SpO2: 98% 100% 97%   Weight:      Height:        Wt Readings from Last 3 Encounters:  12/01/17 95.5 kg (210 lb 9.6 oz)  11/09/17 97.2 kg (214 lb 3.2 oz)  11/04/17 96.4 kg (212 lb 8 oz)     Intake/Output Summary (Last 24 hours) at 12/02/2017 1414 Last data filed at 12/02/2017 1338 Gross per 24 hour  Intake 983.33 ml  Output 3560 ml  Net -2576.67 ml     Physical Exam  Gen:-More awake, in no acute distress  HEENT:- [place].AT, No sclera  icterus Ears-very very hard of hearing  neck-Supple Neck,No JVD, right subclavian central line Lungs-left-sided chest tubes with some sanguinous drainage  CV- S1, S2 normal Abd-  +ve B.Sounds, Abd Soft, No tenderness,    Extremity/Skin:- No  edema,   good pulses Psych-alert and oriented x3 , affect is appropriate  neuro-no new focal deficits, no tremors   Data Review:   Micro Results Recent Results (from the past 240 hour(s))  Culture, blood (routine x 2)     Status: None   Collection Time: 11/24/17  1:00 PM  Result Value Ref Range Status   Specimen Description BLOOD LEFT ANTECUBITAL  Final   Special Requests   Final    BOTTLES DRAWN AEROBIC AND ANAEROBIC Blood Culture adequate volume   Culture   Final    NO GROWTH 5 DAYS Performed at [facility], [place] [address] [place], [place] [address]    Report Status 11/29/2017 FINAL  Final  Culture, blood (routine x 2)     Status: Abnormal   Collection Time: 11/24/17  1:09 PM  Result Value Ref Range Status   Specimen Description BLOOD RIGHT ANTECUBITAL  Final   Special Requests   Final    BOTTLES DRAWN AEROBIC ONLY Blood Culture results may not be optimal due to an inadequate volume of blood received in culture bottles   Culture  Setup Time   Final    GRAM POSITIVE COCCI AEROBIC BOTTLE ONLY CRITICAL RESULT CALLED TO, READ BACK BY AND VERIFIED WITH: Ferne Coe PharmD 10:25 11/25/17 ([email or web address])    Culture (A)  Final    STAPHYLOCOCCUS SPECIES (COAGULASE NEGATIVE) THE SIGNIFICANCE OF ISOLATING THIS ORGANISM FROM A SINGLE SET OF BLOOD CULTURES WHEN MULTIPLE SETS ARE DRAWN IS UNCERTAIN. PLEASE NOTIFY THE MICROBIOLOGY DEPARTMENT WITHIN ONE WEEK IF SPECIATION AND SENSITIVITIES ARE REQUIRED. Performed at [facility], [place]  [address], [place], [place] [address]    Report Status 11/27/2017 FINAL  Final  Blood Culture ID Panel (Reflexed)     Status: Abnormal   Collection Time: 11/24/17  1:09 PM  Result Value Ref Range Status    Enterococcus species NOT DETECTED NOT DETECTED Final   Listeria monocytogenes NOT DETECTED NOT DETECTED Final   Staphylococcus species DETECTED (A) NOT DETECTED Final    Comment: Methicillin (oxacillin) resistant coagulase negative staphylococcus. Possible blood culture contaminant (unless isolated from more than one blood culture draw or clinical case suggests pathogenicity). No antibiotic treatment is indicated for blood  culture contaminants. CRITICAL RESULT CALLED TO, READ BACK BY AND VERIFIED WITH: Ferne Coe PharmD 10:25 11/25/17 ([email or web address])    Staphylococcus aureus NOT DETECTED NOT DETECTED Final   Methicillin resistance DETECTED (A) NOT DETECTED Final    Comment: CRITICAL RESULT CALLED TO, READ BACK BY AND VERIFIED WITH: Ferne Coe PharmD 10:25 11/25/17 ([email or web address])    Streptococcus species NOT DETECTED NOT DETECTED Final   Streptococcus agalactiae NOT DETECTED NOT DETECTED Final   Streptococcus pneumoniae NOT DETECTED NOT DETECTED Final   Streptococcus pyogenes NOT DETECTED NOT DETECTED Final   Acinetobacter baumannii NOT DETECTED NOT DETECTED Final   Enterobacteriaceae species NOT DETECTED NOT DETECTED Final   Enterobacter cloacae complex NOT DETECTED NOT DETECTED Final   Escherichia coli NOT DETECTED NOT DETECTED Final   Klebsiella oxytoca NOT DETECTED NOT DETECTED Final   Klebsiella pneumoniae NOT DETECTED NOT DETECTED Final   Proteus species NOT DETECTED NOT DETECTED Final   Serratia marcescens NOT DETECTED NOT DETECTED Final   Haemophilus influenzae NOT DETECTED NOT DETECTED Final   Neisseria meningitidis NOT DETECTED NOT DETECTED Final   Pseudomonas aeruginosa NOT DETECTED NOT DETECTED Final   Candida albicans NOT DETECTED NOT DETECTED Final   Candida glabrata NOT DETECTED NOT DETECTED Final   Candida krusei NOT DETECTED NOT DETECTED Final   Candida parapsilosis NOT DETECTED NOT DETECTED Final   Candida tropicalis NOT DETECTED NOT DETECTED Final    Comment: Performed at [facility], [address], [place], [place] [address]  Gram stain     Status: None   Collection Time: 11/25/17  3:43 PM  Result Value Ref Range Status   Specimen Description FLUID PLEURAL  Final   Special Requests NONE  Final   Gram Stain   Final    ABUNDANT WBC PRESENT,BOTH PMN AND MONONUCLEAR NO ORGANISMS SEEN Performed at [facility], [address], [place], [place] [address]    Report Status 11/25/2017 FINAL  Final  Body fluid culture     Status: None   Collection Time: 11/26/17  1:01 PM  Result Value Ref Range Status   Specimen Description PLEURAL FLUID LEFT  Final   Special Requests NONE  Final   Gram Stain   Final    MODERATE WBC PRESENT,BOTH PMN AND MONONUCLEAR NO ORGANISMS SEEN    Culture   Final    NO GROWTH 3 DAYS Performed at [facility], [place] [address], [place], [place] [address]    Report Status 11/29/2017 FINAL  Final  Surgical pcr screen     Status: None   Collection Time: 11/27/17  2:54 PM  Result Value Ref Range Status   MRSA, PCR NEGATIVE NEGATIVE Final   Staphylococcus aureus NEGATIVE NEGATIVE Final    Comment: (NOTE) The Xpert SA Assay (FDA approved for NASAL specimens in patients 72 years of age and older), is one component of a  comprehensive surveillance program. It is not intended to diagnose infection nor to guide or monitor treatment. Performed at [facility], [place] [address] [place], [place] [address]   Aerobic/Anaerobic Culture (surgical/deep wound)     Status: None (Preliminary result)   Collection Time: 11/29/17  5:47 PM  Result Value Ref Range Status   Specimen Description FLUID LEFT PLEURAL  Final   Special Requests NONE  Final   Gram Stain   Final    RARE WBC PRESENT, PREDOMINANTLY PMN NO ORGANISMS SEEN    Culture   Final    NO GROWTH 3 DAYS NO ANAEROBES ISOLATED; CULTURE IN PROGRESS FOR 5 DAYS Performed at [facility], [place] [address], [place], [place] [address]    Report Status PENDING  Incomplete   Acid Fast Smear (AFB)     Status: None   Collection Time: 11/29/17  5:47 PM  Result Value Ref Range Status   AFB Specimen Processing Concentration  Final   Acid Fast Smear Negative  Final    Comment: (NOTE) Performed At: [facility] [place], [name] [id number] Rush Farmer MD HR:[id number]    Source (AFB) FLUID  Final    Comment: LEFT PLEURAL Performed at [facility], [place] [address], [place], [place] [address]   Aerobic/Anaerobic Culture (surgical/deep wound)     Status: None (Preliminary result)   Collection Time: 11/29/17  5:55 PM  Result Value Ref Range Status   Specimen Description TISSUE  Final   Special Requests LEFT PLEURAL PEEL  Final   Gram Stain   Final    MODERATE WBC PRESENT, PREDOMINANTLY PMN NO ORGANISMS SEEN    Culture   Final    NO GROWTH 3 DAYS NO ANAEROBES ISOLATED; CULTURE IN PROGRESS FOR 5 DAYS Performed at [facility], [place] [address] [place], [place] [address]    Report Status PENDING  Incomplete    Radiology Reports Dg Chest 1 View  Result Date: 11/26/2017 CLINICAL DATA:  Status post thoracentesis. EXAM: CHEST  1 VIEW COMPARISON:  Yesterday. FINDINGS: Significant decrease in amount of left pleural fluid following thoracentesis. No pneumothorax. Stable enlarged cardiac silhouette. Dense airspace opacity at the left lung base, increased. Clear right lung with an overlying skin fold. Bilateral shoulder degenerative changes. IMPRESSION: 1. No pneumothorax following left thoracentesis. 2. Dense left lower lobe atelectasis or pneumonia, significantly increased. 3. Stable cardiomegaly. Electronically Signed   By: Claudie Revering M.D.   On: 11/26/2017 13:06   Dg Chest 1 View  Result Date: 11/23/2017 CLINICAL DATA:  Hypoxia.  Atrial fibrillation. EXAM: CHEST  1 VIEW COMPARISON:  November 22, 2017 FINDINGS: There is opacity throughout much of the left lung, in part due to pleural effusion. There is also patchy airspace consolidation  throughout much of the left lung. There is slight atelectatic change in the right base. The right lung is otherwise clear. Heart is mildly enlarged with pulmonary vascularity within normal limits. Pacemaker lead is attached to the right ventricle. No adenopathy. There is aortic atherosclerosis. There is degenerative change in each shoulder. IMPRESSION: Opacity throughout much of the left lung likely due to a combination of pleural effusion and airspace consolidation, essentially stable. Slight right base atelectasis. Right lung otherwise clear. Stable cardiomegaly. Stable pacemaker placement. There is aortic atherosclerosis. Aortic Atherosclerosis (ICD10-I70.0). Electronically Signed   By: Lowella Grip III M.D.   On: 11/23/2017 08:17   Dg Chest 1 View  Result Date: 11/22/2017 CLINICAL DATA:  Progressive cough, congestion, and shortness of breath. Fall. EXAM: CHEST  1 VIEW COMPARISON:  Two-view chest x-ray 11/20/2017 FINDINGS: The heart size is exaggerated by low lung volumes. Increased asymmetric left-sided interstitial and airspace disease is noted. A left pleural effusion is present. Right-sided interstitial disease is increasing. IMPRESSION: 1. Increasing interstitial and airspace disease in the left lung consistent with edema and infection. 2. Increasing left pleural effusion. 3. Progressive edema in the right lung. Electronically Signed   By: San Morelle M.D.   On: 11/22/2017 07:34   Dg Chest 2 View  Result Date: 11/20/2017 CLINICAL DATA:  84 year old with chronic cough, presenting with acute worsening, associated with congestion, shortness of breath and fever. EXAM: CHEST - 2 VIEW COMPARISON:  07/21/2017, 07/19/2017, 01/08/2015 and earlier. FINDINGS: AP ERECT and LATERAL images were obtained. LEFT subclavian single lead transvenous pacemaker with the lead tip at the expected location of the RV apex, unchanged. Cardiac silhouette moderately enlarged, unchanged since earlier this year but  increased in size since 2016. Thoracic aorta mildly atherosclerotic, unchanged. Hilar and mediastinal contours otherwise unremarkable. Airspace consolidation involving the LEFT lower lobe associated with a LEFT pleural effusion. Pulmonary venous hypertension without overt edema. Degenerative changes involving the thoracic and UPPER lumbar spine. IMPRESSION: 1. Acute LEFT lower lobe pneumonia with an associated LEFT pleural effusion. 2. Stable moderate cardiomegaly. Pulmonary venous hypertension without overt edema. Electronically Signed   By: Evangeline Dakin M.D.   On: 11/20/2017 14:10   Ct Head Wo Contrast  Result Date: 11/20/2017 CLINICAL DATA:  Tripped and fell 3 days ago, hitting the head. Bruising to the right temple. On anticoagulation. Initial encounter. EXAM: CT HEAD WITHOUT CONTRAST TECHNIQUE: Contiguous axial images were obtained from the base of the skull through the vertex without intravenous contrast. COMPARISON:  07/20/2017 FINDINGS: Brain: There is no evidence of acute infarct, intracranial hemorrhage, mass, midline shift, or extra-axial fluid collection. Mild cerebral atrophy is unchanged. Periventricular white matter hypodensities are also unchanged and nonspecific but compatible with mild chronic small vessel ischemic disease. Vascular: Calcified atherosclerosis at the skull base. No hyperdense vessel. Skull: No fracture or focal osseous lesion. Sinuses/Orbits: Improved paranasal sinus aeration. Moderate residual right ethmoid and right maxillary sinus mucosal thickening. Large right mastoid effusion, mildly increased from prior. Resolved right middle ear opacification. At most trace fluid at the left mastoid tip. Other: Small right frontotemporal scalp hematoma. IMPRESSION: 1. No evidence of acute intracranial abnormality. 2. Small right frontotemporal scalp hematoma. 3. Mild chronic small vessel ischemic disease. 4. Persistent large right mastoid effusion. Resolved right middle ear effusion.  5. Improved paranasal sinus aeration. Electronically Signed   By: Logan Bores M.D.   On: 11/20/2017 15:14   Ct Chest Wo Contrast  Result Date: 11/23/2017 CLINICAL DATA:  Left pleural effusion. EXAM: CT CHEST WITHOUT CONTRAST TECHNIQUE: Multidetector CT imaging of the chest was performed following the standard protocol without IV contrast. COMPARISON:  Chest x-ray from same day. CT chest dated April 17, 2014. FINDINGS: Cardiovascular: Mild cardiomegaly with unchanged left chest wall pacemaker. No pericardial effusion. Normal caliber thoracic aorta. Coronary, aortic arch, and branch vessel atherosclerotic vascular disease. Mediastinum/Nodes: Multiple prominent bilateral axillary, left supraclavicular, and mediastinal lymph nodes measuring up to 10 mm in short axis are overall similar to prior study. The thyroid gland trachea, and esophagus demonstrate no significant findings. Lungs/Pleura: Moderate to large left pleural effusion. Trace right pleural effusion. Complete collapse of the left lower lobe. Areas of mucous impaction in the right lower lobe with  subsegmental atelectasis. Mild diffuse peribronchial thickening. No consolidation or pneumothorax. No suspicious pulmonary nodule. Mild mosaic attenuation throughout the lungs. Upper Abdomen: No acute abnormality. Musculoskeletal: No chest wall abnormality. No acute or significant osseous findings. Degenerative changes of the thoracic spine. IMPRESSION: 1. Moderate to large left pleural effusion with complete collapse of the left lower lobe. 2. Trace right pleural effusion with right lower lobe subsegmental atelectasis. 3. Mild mosaic attenuation throughout the lungs could reflect chronic small airways or small vessel disease. 4. Prominent bilateral axillary, left supraclavicular, and mediastinal lymph nodes measuring up to 10 mm in short axis are overall similar to prior study and likely reflect patient's history of chronic lymphocytic leukemia. 5.  Aortic  atherosclerosis (ICD10-I70.0). Electronically Signed   By: Titus Dubin M.D.   On: 11/23/2017 18:00   Dg Chest Port 1 View  Result Date: 12/02/2017 CLINICAL DATA:  Chest tube EXAM: PORTABLE CHEST 1 VIEW COMPARISON:  12/01/2017 FINDINGS: Left chest tubes remain in place, unchanged. No pneumothorax. Pacer is unchanged as is right central line. Mild cardiomegaly with vascular congestion and bibasilar atelectasis. IMPRESSION: Stable cardiomegaly with vascular congestion and bibasilar atelectasis. No pneumothorax. Electronically Signed   By: Rolm Baptise M.D.   On: 12/02/2017 07:51   Dg Chest Port 1 View  Result Date: 12/01/2017 CLINICAL DATA:  Status post VATS for empyema 11/29/2017. Chest tube in place. EXAM: PORTABLE CHEST 1 VIEW COMPARISON:  Single-view of the chest 11/30/2017 and 11/29/2017. FINDINGS: Right subclavian central venous catheter and 2 left chest tubes remain in place. Small left pleural effusion and basilar airspace disease are unchanged. No pneumothorax. Subsegmental atelectasis in the right base has improved since yesterday's examination. Heart size is mildly enlarged. Aortic atherosclerosis is noted. IMPRESSION: Negative for pneumothorax with left chest tubes in place. No change in a small left effusion and basilar airspace disease. Improved right basilar subsegmental atelectasis. Electronically Signed   By: Inge Rise M.D.   On: 12/01/2017 09:36   Dg Chest Port 1 View  Result Date: 11/30/2017 CLINICAL DATA:  Status post thoracotomy. History of leukemia, atrial fibrillation. EXAM: PORTABLE CHEST 1 VIEW COMPARISON:  Chest radiograph November 30, 2017 FINDINGS: Stable cardiomegaly. Two LEFT apical chest tubes in place. LEFT apical pleural thickening. No pneumothorax. RIGHT subclavian central venous catheter distal tip projects in distal superior vena cava. Bibasilar strandy densities. Single lead LEFT cardiac pacemaker. Similar LEFT chest wall subcutaneous gas. IMPRESSION: Stable  life-support lines including 2 LEFT apical chest tubes. LEFT apical pleural thickening and bibasilar atelectasis. No pneumothorax. Stable cardiomegaly. Electronically Signed   By: Elon Alas M.D.   On: 11/30/2017 14:25   Dg Chest Port 1 View  Result Date: 11/30/2017 CLINICAL DATA:  Status post VATS on the left EXAM: PORTABLE CHEST 1 VIEW COMPARISON:  11/30/2017 FINDINGS: Pacemaker is again identified and stable. Right central venous line is noted and stable. Two chest tubes are seen on the left. Significant improved aeration in the left is noted when compared with the prior exam. Degenerative changes of the thoracic spine are noted. IMPRESSION: Significant improved aeration on the left. Electronically Signed   By: Inez Catalina M.D.   On: 11/30/2017 13:50   Dg Chest Port 1 View  Result Date: 11/30/2017 CLINICAL DATA:  Central line placement. EXAM: PORTABLE CHEST 1 VIEW COMPARISON:  Radiograph earlier this day at 0014 hour FINDINGS: Right subclavian central line with tip in the distal SVC. No right pneumothorax. Left chest tube is unchanged in position with tip directed towards  the apex. Slight decreased density of the suspected extrapleural left lung opacity. Decreased conspicuity of the pneumothorax versus pneumomediastinum adjacent to the left heart border. Linear atelectasis in the right midlung versus fluid in the fissure. IMPRESSION: 1. Right subclavian central line with tip in the distal SVC. No right pneumothorax. 2. Slightly decreased density of the presumed extrapleural opacity in the left hemithorax. Left chest tubes remain in place. Decreasing conspicuity of the pneumomediastinum versus medial pneumothorax adjacent to the left heart border. Electronically Signed   By: Jeb Levering M.D.   On: 11/30/2017 04:28   Dg Chest Port 1 View  Result Date: 11/30/2017 CLINICAL DATA:  Thoracotomy scar left chest. EXAM: PORTABLE CHEST 1 VIEW COMPARISON:  Radiograph yesterday. FINDINGS: Significant  change from prior exam with near complete opacification of the left hemithorax, opacity primarily laterally and superiorly with lobular borders, suggesting extrapleural process. Two left chest tubes are in place. Pneumomediastinum versus medial pneumothorax along the left heart border. Left-sided pacemaker in place. Mild cardiomegaly is similar. Right lung is grossly clear. IMPRESSION: 1. Significant change from prior exam with near complete opacification of left hemithorax, opacity primarily peripheral and superiorly with lobular borders suggesting extrapleural. Given recent thoracoscopy, hemothorax is primary concern. 2. Pneumomediastinum versus pneumothorax along the left heart border. These results will be called to the ordering clinician or representative by the Radiologist Assistant, and communication documented in the PACS or zVision Dashboard. Electronically Signed   By: Jeb Levering M.D.   On: 11/30/2017 01:14   Dg Chest Port 1 View  Result Date: 11/29/2017 CLINICAL DATA:  Postop VATS.  Empyema. EXAM: PORTABLE CHEST 1 VIEW COMPARISON:  Chest radiograph 11/29/2017 FINDINGS: Left-sided chest tube and drain tips are at the left lung apex. There is bibasilar atelectasis without focal consolidation. No pneumothorax or sizable pleural effusion. Unchanged cardiomegaly. IMPRESSION: No pneumothorax. Electronically Signed   By: Ulyses Jarred M.D.   On: 11/29/2017 20:20   Dg Chest Port 1 View  Result Date: 11/29/2017 CLINICAL DATA:  Shortness of Breath EXAM: PORTABLE CHEST 1 VIEW COMPARISON:  11/27/2017 FINDINGS: Cardiac shadow is stable. Pacing device is again seen. Right lung is clear. Small left pleural effusion is noted. There is a rounded area of density noted laterally likely related to a loculated component. No bony abnormality is seen. IMPRESSION: Stable appearing left pleural effusion with some degree of loculation. Electronically Signed   By: Inez Catalina M.D.   On: 11/29/2017 08:59   Dg Chest  Port 1 View  Result Date: 11/27/2017 CLINICAL DATA:  Pneumonia EXAM: PORTABLE CHEST 1 VIEW COMPARISON:  November 26, 2017 FINDINGS: There is layering pleural effusion on the left. There is consolidation in the left base. There is mild right base atelectasis. Right lung otherwise is clear. Heart is mildly enlarged with pulmonary vascularity normal. Pacemaker lead attached to right ventricle. No pneumothorax. No adenopathy. No bone lesions. IMPRESSION: Layering pleural effusion on the left with persistent consolidation left base. Mild right base atelectasis. Stable cardiac prominence. Electronically Signed   By: Lowella Grip III M.D.   On: 11/27/2017 09:27   Dg Chest Port 1 View  Result Date: 11/25/2017 CLINICAL DATA:  84 year old male with shortness of breath and pleural effusion. EXAM: PORTABLE CHEST 1 VIEW COMPARISON:  11/23/2017 CT, chest radiograph and prior studies FINDINGS: Cardiomediastinal silhouette is unchanged. A LEFT pacemaker again noted. LEFT pleural effusion and LEFT LOWER lung consolidation/atelectasis again noted. The RIGHT lung is clear. No pneumothorax or acute bony abnormality. IMPRESSION: Little significant  change in appearance of the chest with continued LEFT pleural effusion and LEFT LOWER lung atelectasis/consolidation. Electronically Signed   By: Margarette Canada M.D.   On: 11/25/2017 16:53   Dg Swallowing Func-speech Pathology  Result Date: 11/23/2017 Objective Swallowing Evaluation: Type of Study: MBS-Modified Barium Swallow Study  Patient Details Name: COMPTON BRIGANCE MRN: [id number] Date of Birth: [birth year] Today's Date: 11/23/2017 Time: SLP Start Time (ACUTE ONLY): 1324 -SLP Stop Time (ACUTE ONLY): 1637 SLP Time Calculation (min) (ACUTE ONLY): 53 min Past Medical History: Past Medical History: Diagnosis Date . Asthma 1950's  history of . Atrial fibrillation ([place])  . Bilateral carpal tunnel syndrome  . Bilateral lower extremity edema  . Bladder tumor  . Chronic systolic heart failure (HCC)    Echo 1/19: Mild LVH, EF 45-50, inf HK, MAC, severe LAE, severe RAE // Echo 7/15: Mild LVH, mod focal basal sept hypertrophy, EF 55-60, AV peak and mean 16/9, trivial MR, mod LAE, PASP 38 . CLL (chronic lymphocytic leukemia) [facility]) oncologist-  dr Ilene Qua--  dx [phone] ;  Lymphocytosis, CLL - per lov note 05-11-2017 currently under active survillance,  CT 04-17-2014 show very small lymphadenopathy, no indication for treatment . Coronary artery disease   cardiologist-  dr Cathie Olden--  08-18-2017 Intermittant risk nuclear study w/ large area of inferior infartion with no evidence ishcemia  . Deafness in right ear  . Diabetes mellitus type 2, noninsulin dependent ([place])  . Elevated PSA   since prostatectomy but now resolved . Hematuria 04/2017 . History of ear infection   Right . History of MI (myocardial infarction)   per myoview nuclear study 08-18-2017 , unknown when . History of shingles 08/2017  L ear and scalp, possible . Hyperlipidemia  . Hypertension  . Ischemic cardiomyopathy 09/01/2017  Presumed +CAD with Nuclear stress test 08/18/17 - Inferior scar, no ischemia, intermediate risk // med management unless +angina or worse dyspnea . OA (osteoarthritis)  . Pacemaker 02/08/2014  followed by dr g. taylor--  single chamber Biotronik due to SSS . Permanent atrial fibrillation ([place])  . Pneumonia 2019  Left lung . Prostate cancer [facility]) urologist-  dr Diona Fanti  dx 2004--  Gleason 8, PSA 10.45--  11-28-2002  s/p  radical prostatectomy;  recurrent w/ increasing PSA, started ADT treatment . RBBB (right bundle branch block)  . Sick sinus syndrome ([place])   a-Flutter with episodes of bradycardia; S/P Biotronik (serial number [id number]) 02-08-2014 . Urinary incontinence  . Wears hearing aid in right ear   receiver and transmitter Past Surgical History: Past Surgical History: Procedure Laterality Date . APPENDECTOMY   . BACK SURGERY    disk . CARDIAC CATHETERIZATION  09-03-1999  dr Cathie Olden  abnormal cardiolite study:  minor luminal  irregularities but no critial coronary artery stenosis . CARDIOVASCULAR STRESS TEST  08-18-2017  dr Cathie Olden  Intermediate risk nuclear study w/ large area inferior infarction, no evidence of ishcemia (consistant w/ prior MI)/  study not gated due to frequent PVCs . CARPAL TUNNEL RELEASE Right 2000 . CARPAL TUNNEL RELEASE Left 11/19/2009 . CATARACT EXTRACTION Right 07/2015 . CATARACT EXTRACTION Left 09/2015 . CYSTOSCOPY N/A 11/04/2017  Procedure: CYSTOSCOPY AND CAUTERIZATION OF BLADDER;  Surgeon: Franchot Gallo, MD;  Location: [facility];  Service: Urology;  Laterality: N/A; . INSERTION PENILE PROSTHESIS  02-22-2004    dr Mattie Marlin  [facility] . KNEE ARTHROSCOPY Left 07/2010 . PERMANENT PACEMAKER INSERTION N/A 02/08/2014  Procedure: PERMANENT PACEMAKER INSERTION;  Surgeon: Evans Lance, MD;  Location: [facility] CATH LAB;  Service: Cardiovascular;  Laterality: N/A; . RADICAL RETROPUBIC PROSTATECTOMY W/ BILATERAL PELVIC LYMPH NODE DISSECTION  11-28-2002   dr Mattie Marlin  [facility] . TONSILLECTOMY   . TOTAL HIP ARTHROPLASTY Left 05/12/2016  Procedure: LEFT TOTAL HIP ARTHROPLASTY ANTERIOR APPROACH;  Surgeon: Paralee Cancel, MD;  Location: [facility];  Service: Orthopedics;  Laterality: Left; . TOTAL HIP ARTHROPLASTY Right 07-15-2006   dr Alvan Dame  [facility] . TRANSTHORACIC ECHOCARDIOGRAM  07/20/2017  mild LVH, ef 45-50%, hypokinesis of the basal-midinferior myocardium, due to AFib unable to evaluate diastolic function/  severe LAE and RAE/  trivial PR and TR HPI: Pt is an 84 year-old male with a history of CLL, prostate cancer, sick sinus syndrome, pacemaker, paroxysmal atrial fibrillation, CHF with ejection fraction 45% admitted with LLL PNA. Per consult note from critical care, pt/wife describe cyclic cough for over a year, with coughing sometimes escalating during meals, especially with dry foods.  Subjective: alert, denies swallowing difficulty Assessment / Plan / Recommendation CHL IP CLINICAL IMPRESSIONS 11/23/2017 Clinical Impression  Pt has a mild oropharyngeal dysphagia with structural component due to suspected osteophytes most prominent at C3-4, C4-5 that impede full epiglottic deflection and airway closure. What further impacts his ability to protect his airway is his impulsive intake (large boluses and fast rate) and premature spillage. Thin and nectar thick liquids spill into the pharynx, filling the valleculae and spilling onward to the pyriform sinuses before the swallow. Then, as he swallows, the thin liquids spills into the partially open airway, allowing for trace amounts of penetration and aspiration. Pt had significant baseline coughing that made it very challenging to determine whether coughing was related to aspiration or not. SLP provided Mod cues for attempts at a chin tuck, oral holding, and smaller bolus sizes. Small sips were the most effective at protecting the airway with thin liquids although I suspect if he could have performed the oral hold that this would further increase his safety with swallowing. Unfortunately, pt could not consistently utilize these strategies. No airway compromise occurred with thicker liquids or solids, although pt did have to take intermittent pauses during oral prep with solids to catch his breath. Although pt's dysphagia is mild, considering his impulsivity as well as his recurrent PNA and chronic cough, would favor starting a more conservative diet at this time: Dys 3 diet and nectar thick liquids. SLP will f/u to facilitate hopeful transition back to thin liquids with improved ability to take small sips, contain liquids orally, and increase oral care/use of aspiration precautions.  SLP Visit Diagnosis Dysphagia, oropharyngeal phase (R13.12) Attention and concentration deficit following -- Frontal lobe and executive function deficit following -- Impact on safety and function Mild aspiration risk   CHL IP TREATMENT RECOMMENDATION 11/23/2017 Treatment Recommendations Therapy as outlined in  treatment plan below   Prognosis 11/23/2017 Prognosis for Safe Diet Advancement Good Barriers to Reach Goals -- Barriers/Prognosis Comment -- CHL IP DIET RECOMMENDATION 11/23/2017 SLP Diet Recommendations Dysphagia 3 (Mech soft) solids;Nectar thick liquid Liquid Administration via Cup Medication Administration Whole meds with puree Compensations Slow rate;Small sips/bites Postural Changes Seated upright at 90 degrees   CHL IP OTHER RECOMMENDATIONS 11/23/2017 Recommended Consults -- Oral Care Recommendations Oral care BID Other Recommendations Order thickener from pharmacy;Prohibited food (jello, ice cream, thin soups);Remove water pitcher   CHL IP FOLLOW UP RECOMMENDATIONS 11/23/2017 Follow up Recommendations Outpatient SLP;Home health SLP   CHL IP FREQUENCY AND DURATION 11/23/2017 Speech Therapy Frequency (ACUTE ONLY) min 2x/week Treatment Duration 2 weeks  CHL IP ORAL PHASE 11/23/2017 Oral Phase Impaired Oral - Pudding Teaspoon -- Oral - Pudding Cup -- Oral - Honey Teaspoon -- Oral - Honey Cup -- Oral - Nectar Teaspoon -- Oral - Nectar Cup Decreased bolus cohesion;Premature spillage Oral - Nectar Straw -- Oral - Thin Teaspoon -- Oral - Thin Cup Decreased bolus cohesion;Premature spillage Oral - Thin Straw Decreased bolus cohesion;Premature spillage Oral - Puree Piecemeal swallowing Oral - Mech Soft Delayed oral transit Oral - Regular -- Oral - Multi-Consistency -- Oral - Pill -- Oral Phase - Comment --  CHL IP PHARYNGEAL PHASE 11/23/2017 Pharyngeal Phase Impaired Pharyngeal- Pudding Teaspoon -- Pharyngeal -- Pharyngeal- Pudding Cup -- Pharyngeal -- Pharyngeal- Honey Teaspoon -- Pharyngeal -- Pharyngeal- Honey Cup -- Pharyngeal -- Pharyngeal- Nectar Teaspoon -- Pharyngeal -- Pharyngeal- Nectar Cup Reduced epiglottic inversion;Reduced airway/laryngeal closure;Delayed swallow initiation-pyriform sinuses Pharyngeal -- Pharyngeal- Nectar Straw -- Pharyngeal -- Pharyngeal- Thin Teaspoon -- Pharyngeal -- Pharyngeal- Thin Cup  Reduced epiglottic inversion;Reduced airway/laryngeal closure;Delayed swallow initiation-pyriform sinuses;Penetration/Aspiration during swallow Pharyngeal Material enters airway, CONTACTS cords and not ejected out Pharyngeal- Thin Straw Reduced epiglottic inversion;Reduced airway/laryngeal closure;Delayed swallow initiation-pyriform sinuses;Penetration/Aspiration during swallow Pharyngeal Material enters airway, passes BELOW cords without attempt by patient to eject out (silent aspiration) Pharyngeal- Puree Reduced epiglottic inversion;Reduced airway/laryngeal closure Pharyngeal -- Pharyngeal- Mechanical Soft Reduced epiglottic inversion;Reduced airway/laryngeal closure Pharyngeal -- Pharyngeal- Regular -- Pharyngeal -- Pharyngeal- Multi-consistency -- Pharyngeal -- Pharyngeal- Pill -- Pharyngeal -- Pharyngeal Comment --  CHL IP CERVICAL ESOPHAGEAL PHASE 11/23/2017 Cervical Esophageal Phase WFL Pudding Teaspoon -- Pudding Cup -- Honey Teaspoon -- Honey Cup -- Nectar Teaspoon -- Nectar Cup -- Nectar Straw -- Thin Teaspoon -- Thin Cup -- Thin Straw -- Puree -- Mechanical Soft -- Regular -- Multi-consistency -- Pill -- Cervical Esophageal Comment -- No flowsheet data found. Oscar Baker 11/23/2017, 5:04 PM  Oscar Baker, M.A. CCC-SLP [phone]             Ir Thoracentesis Asp Pleural Space W/img Guide  Result Date: 11/26/2017 INDICATION: Patient with left pleural effusion. Request is made for diagnostic and therapeutic thoracentesis. EXAM: ULTRASOUND GUIDED DIAGNOSTIC AND THERAPEUTIC LEFT THORACENTESIS MEDICATIONS: 10 mL 2% lidocaine COMPLICATIONS: None immediate. PROCEDURE: An ultrasound guided thoracentesis was thoroughly discussed with the patient and questions answered. The benefits, risks, alternatives and complications were also discussed. The patient understands and wishes to proceed with the procedure. Written consent was obtained. Ultrasound was performed to localize and mark an adequate pocket of  fluid in the left chest. The area was then prepped and draped in the normal sterile fashion. 2% lidocaine was used for local anesthesia. Under ultrasound guidance a Safe-T-Centesis catheter was introduced. Thoracentesis was performed. The catheter was removed and a dressing applied. FINDINGS: A total of approximately 70 mL of cloudy yellow fluid was removed. Samples were sent to the laboratory as requested by the clinical team. IMPRESSION: Successful ultrasound guided diagnostic and therapeutic left thoracentesis yielding 70 mL of pleural fluid. Read by: Brynda Greathouse PA-C Electronically Signed   By: Markus Daft M.D.   On: 11/26/2017 14:15     CBC Recent Labs  Lab 11/26/17 0403 11/27/17 0240 11/28/17 0257 11/29/17 0211  11/30/17 0634 11/30/17 1115 11/30/17 1434 11/30/17 2242 12/01/17 0430 12/02/17 0419  WBC 37.3* 37.7* 47.4* 51.3*   < > 43.7*  --  45.5* 32.5* 30.1* 30.4*  HGB 9.8* 10.2* 10.3* 10.3*   < > 7.2* 9.2* 10.2* 8.8* 8.5* 9.1*  HCT 28.9* 30.2* 31.1* 31.5*   < > 21.6*  27.0* 31.0* 26.3* 25.5* 28.8*  PLT 550* 631* 671* 715*   < > 377  --  352 353 333 359  MCV 96.0 95.9 96.9 98.1   < > 93.9  --  90.9 89.8 90.7 95.0  MCH 32.6 32.4 32.1 32.1   < > 31.3  --  29.9 30.0 30.2 30.0  MCHC 33.9 33.8 33.1 32.7   < > 33.3  --  32.9 33.5 33.3 31.6  RDW 18.5* 18.6* 18.7* 18.9*   < > 17.8*  --  19.1* 18.8* 18.9* 19.1*  LYMPHSABS 21.3* 26.8* 31.8* 34.4*  --   --   --   --   --   --   --   MONOABS 2.6* 1.1* 1.9* 1.0  --   --   --   --   --   --   --   EOSABS 0.0 0.0 0.0 0.0  --   --   --   --   --   --   --   BASOSABS 0.0 0.0 0.0 0.0  --   --   --   --   --   --   --    < > = values in this interval not displayed.    Chemistries  Recent Labs  Lab 11/28/17 1301  11/30/17 0634 11/30/17 1115 11/30/17 2242 12/01/17 0430 12/01/17 2000 12/01/17 2335 12/02/17 0419  NA 135   < > 144 136 137 139 136  --  139  K 5.3*   < > 3.1* 4.8 3.8 3.9 6.2* 4.3 4.2  CL 97*   < > 122*  --  103 107 102  --   102  CO2 28   < > 16*  --  _0 --  30  GLUCOSE 320*   < > 167*  --  168* 129* 334*  --  181*  BUN 30*   < > 28*  --  37* 34* 27*  --  20  CREATININE 1.01   < > 0.92  --  1.37* 1.21 1.02  --  0.83  CALCIUM 9.1   < > 4.9*  --  7.8* 7.7* 7.9*  --  8.1*  AST 22  --   --   --   --   --   --   --  36  ALT 20  --   --   --   --   --   --   --  33  ALKPHOS 77  --   --   --   --   --   --   --  60  BILITOT 1.0  --   --   --   --   --   --   --  0.7   < > = values in this interval not displayed.   ------------------------------------------------------------------------------------------------------------------ No results for input(s): CHOL, HDL, LDLCALC, TRIG, CHOLHDL, LDLDIRECT in the last 72 hours.  Lab Results  Component Value Date   HGBA1C 6.0 (H) 05/05/2016   ------------------------------------------------------------------------------------------------------------------ No results for input(s): TSH, T4TOTAL, T3FREE, THYROIDAB in the last 72 hours.  Invalid input(s): FREET3 ------------------------------------------------------------------------------------------------------------------ No results for input(s): VITAMINB12, FOLATE, FERRITIN, TIBC, IRON, RETICCTPCT in the last 72 hours.  Coagulation profile Recent Labs  Lab 11/27/17 0240 11/28/17 0257 11/29/17 0211 11/30/17 0226 11/30/17 0634  INR 1.44 1.38 1.33 2.06 1.40    No results for input(s): DDIMER in the last 72 hours.  Cardiac Enzymes No results for  input(s): CKMB, TROPONINI, MYOGLOBIN in the last 168 hours.  Invalid input(s): CK ------------------------------------------------------------------------------------------------------------------    Component Value Date/Time   BNP 717.8 (H) 11/20/2017 1339     Roxan Hockey M.D on 12/02/2017 at 2:14 PM  Between 7am to 7pm - Pager - [phone]  After 7pm go to [email or web address] - password [email or web address]  [facility] -  Office  [phone]   Voice  Recognition Viviann Spare dictation system was used to create this note, attempts have been made to correct errors. Please contact the author with questions and/or clarifications.

## 2017-12-03 ENCOUNTER — Inpatient Hospital Stay (HOSPITAL_COMMUNITY): Payer: Medicare Other

## 2017-12-03 LAB — CBC
HCT: 31.7 % — ABNORMAL LOW (ref 39.0–52.0)
Hemoglobin: 10.1 g/dL — ABNORMAL LOW (ref 13.0–17.0)
MCH: 30.1 pg (ref 26.0–34.0)
MCHC: 31.9 g/dL (ref 30.0–36.0)
MCV: 94.3 fL (ref 78.0–100.0)
Platelets: 426 10*3/uL — ABNORMAL HIGH (ref 150–400)
RBC: 3.36 MIL/uL — ABNORMAL LOW (ref 4.22–5.81)
RDW: 18.6 % — ABNORMAL HIGH (ref 11.5–15.5)
WBC: 33.5 10*3/uL — ABNORMAL HIGH (ref 4.0–10.5)

## 2017-12-03 LAB — GLUCOSE, CAPILLARY
GLUCOSE-CAPILLARY: 206 mg/dL — AB (ref 65–99)
GLUCOSE-CAPILLARY: 234 mg/dL — AB (ref 65–99)
Glucose-Capillary: 284 mg/dL — ABNORMAL HIGH (ref 65–99)
Glucose-Capillary: 266 mg/dL — ABNORMAL HIGH (ref 65–99)
Glucose-Capillary: 231 mg/dL — ABNORMAL HIGH (ref 65–99)

## 2017-12-03 LAB — CULTURE, BLOOD (ROUTINE X 2): Special Requests: ADEQUATE

## 2017-12-03 LAB — COMPREHENSIVE METABOLIC PANEL
ALT: 29 U/L (ref 17–63)
AST: 25 U/L (ref 15–41)
Albumin: 2.7 g/dL — ABNORMAL LOW (ref 3.5–5.0)
Alkaline Phosphatase: 65 U/L (ref 38–126)
Anion gap: 3 — ABNORMAL LOW (ref 5–15)
BUN: 16 mg/dL (ref 6–20)
CO2: 32 mmol/L (ref 22–32)
Calcium: 8.3 mg/dL — ABNORMAL LOW (ref 8.9–10.3)
Chloride: 103 mmol/L (ref 101–111)
Creatinine, Ser: 0.77 mg/dL (ref 0.61–1.24)
GFR calc Af Amer: >60 mL/min (ref >60)
GFR calc non Af Amer: >60 mL/min (ref >60)
Glucose, Bld: 185 mg/dL — ABNORMAL HIGH (ref 65–99)
Potassium: 4.1 mmol/L (ref 3.5–5.1)
Sodium: 138 mmol/L (ref 135–145)
Total Bilirubin: 0.8 mg/dL (ref 0.3–1.2)
Total Protein: 5.2 g/dL — ABNORMAL LOW (ref 6.5–8.1)

## 2017-12-03 MED ORDER — LEVALBUTEROL HCL 0.63 MG/3ML IN NEBU: 0.6300 mg | INHALATION_SOLUTION | Freq: Four times a day (QID) | RESPIRATORY_TRACT | Status: DC | PRN

## 2017-12-03 MED ORDER — CARVEDILOL 3.125 MG PO TABS
3.1250 mg | ORAL_TABLET | Freq: Two times a day (BID) | ORAL | Status: DC
  Administered 2017-12-03 – 2017-12-05 (×4): 3.125 mg via ORAL
  Filled 2017-12-03 (×4): qty 1

## 2017-12-03 MED ORDER — INSULIN GLARGINE 100 UNIT/ML ~~LOC~~ SOLN
15.0000 [IU] | Freq: Two times a day (BID) | SUBCUTANEOUS | Status: DC
  Filled 2017-12-03: qty 0.15

## 2017-12-03 MED ORDER — INSULIN GLARGINE 100 UNIT/ML ~~LOC~~ SOLN: 18.0000 [IU] | Freq: Two times a day (BID) | SUBCUTANEOUS | Status: DC

## 2017-12-03 MED ORDER — FUROSEMIDE 40 MG PO TABS
40.0000 mg | ORAL_TABLET | Freq: Every day | ORAL | Status: DC
  Administered 2017-12-03 – 2017-12-05 (×3): 40 mg via ORAL
  Filled 2017-12-03 (×3): qty 1

## 2017-12-03 MED ORDER — INSULIN GLARGINE 100 UNIT/ML ~~LOC~~ SOLN
14.0000 [IU] | Freq: Two times a day (BID) | SUBCUTANEOUS | Status: DC
  Administered 2017-12-03 – 2017-12-04 (×3): 14 [IU] via SUBCUTANEOUS
  Filled 2017-12-03 (×3): qty 0.14

## 2017-12-03 NOTE — Progress Notes (Signed)
3 Days Post-Op Procedure(s) (LRB): DRAINAGE OF HEMOTHORAX (Left) VIDEO ASSISTED THORACOSCOPY (Left) Subjective: Status post left VATS drainage of empyema and decortication left lower lobe-cultures negative so far but probable MSSA on ceftriaxone  Return to OR for hemothorax/coagulopathy and evacuation of hematoma with subsequent perioperative blood loss anemia treated with transfusion and tranexamic acid.  Patient much stronger able to walk in the hallway. Wet cough is now improved Chest x-ray shows improved aeration with minimal chest tube drainage We'll remove anterior chest tube today, posterior chest tube tomorrow  Patient has a paced rhythm with underlying A. Fib on preoperative Coumadin which will be started once the patient is discharged and chest tubes removed. Low-dose Lovenox for now.  Objective: Vital signs in last 24 hours: Temp:  [97.9 F (36.6 C)-98.8 F (37.1 C)] 97.9 F (36.6 C) (06/14 0754) Pulse Rate:  [60-79] 74 (06/14 0700) Cardiac Rhythm: Ventricular paced (06/14 0400) Resp:  [18-26] 21 (06/14 0700) BP: (102-134)/(55-74) 134/68 (06/14 0500) SpO2:  [92 %-100 %] 93 % (06/14 0846) Weight:  [204 lb 9.4 oz (92.8 kg)] 204 lb 9.4 oz (92.8 kg) (06/14 0500)  Hemodynamic parameters for last 24 hours:  paced rhythm  Intake/Output from previous day: 06/13 0701 - 06/14 0700 In: 750 [P.O.:680; I.V.:70] Out: 3415 [Urine:3325; Chest Tube:90] Intake/Output this shift: No intake/output data recorded.       Exam    General- alert and comfortable    Neck- no JVD, no cervical adenopathy palpable, no carotid bruit   Lungs- clear without rales, wheezes   Cor- regular rate and rhythm, no murmur , gallop   Abdomen- soft, non-tender   Extremities - warm, non-tender, minimal edema   Neuro- oriented, appropriate, no focal weakness   Lab Results: Recent Labs    12/02/17 0419 12/03/17 0440  WBC 30.4* 33.5*  HGB 9.1* 10.1*  HCT 28.8* 31.7*  PLT 359 426*   BMET:   Recent Labs    12/02/17 0419 12/03/17 0440  NA 139 138  K 4.2 4.1  CL 102 103  CO2 30 32  GLUCOSE 181* 185*  BUN 20 16  CREATININE 0.83 0.77  CALCIUM 8.1* 8.3*    PT/INR: No results for input(s): LABPROT, INR in the last 72 hours. ABG    Component Value Date/Time   PHART 7.449 12/01/2017 0744   HCO3 28.1 (H) 12/01/2017 0744   TCO2 29 12/01/2017 0744   ACIDBASEDEF 1.0 11/30/2017 1506   O2SAT 92.0 12/01/2017 0744   CBG (last 3)  Recent Labs    12/02/17 1608 12/02/17 2119 12/03/17 0837  GLUCAP 300* 254* 284*    Assessment/Plan: S/P Procedure(s) (LRB): DRAINAGE OF HEMOTHORAX (Left) VIDEO ASSISTED THORACOSCOPY (Left) Empyema, hemothorax now both improved Postop hyperglycemia with perioperative steroids now being tapered off Continue IV antibiotics-ceftriaxone. Transition to oral antibiotic at discharge-keflex   LOS: 13 days    Oscar Baker 12/03/2017

## 2017-12-03 NOTE — Progress Notes (Signed)
      [address]       [place] [address]             [phone]      3 Days Post-Op Procedure(s) (LRB): DRAINAGE OF HEMOTHORAX (Left) VIDEO ASSISTED THORACOSCOPY (Left) Subjective: Feels okay this morning. Looking forward to going upstairs.   Objective: Vital signs in last 24 hours: Temp:  [97.9 F (36.6 C)-98.8 F (37.1 C)] 97.9 F (36.6 C) (06/14 0754) Pulse Rate:  [60-79] 74 (06/14 0700) Cardiac Rhythm: Ventricular paced (06/14 0400) Resp:  [18-26] 21 (06/14 0700) BP: (102-134)/(55-74) 134/68 (06/14 0500) SpO2:  [92 %-100 %] 93 % (06/14 0846) Weight:  [92.8 kg (204 lb 9.4 oz)] 92.8 kg (204 lb 9.4 oz) (06/14 0500)     Intake/Output from previous day: 06/13 0701 - 06/14 0700 In: 750 [P.O.:680; I.V.:70] Out: 3415 [Urine:3325; Chest Tube:90] Intake/Output this shift: No intake/output data recorded.  General appearance: alert, cooperative and no distress Heart: regular rate and rhythm, S1, S2 normal, no murmur, click, rub or gallop Lungs: diffuse rhonchi throughout all fields Abdomen: soft, non-tender; bowel sounds normal; no masses,  no organomegaly Extremities: extremities normal, atraumatic, no cyanosis or edema and he does have left hand edema Wound: clean and dry  Lab Results: Recent Labs    12/02/17 0419 12/03/17 0440  WBC 30.4* 33.5*  HGB 9.1* 10.1*  HCT 28.8* 31.7*  PLT 359 426*   BMET:  Recent Labs    12/02/17 0419 12/03/17 0440  NA 139 138  K 4.2 4.1  CL 102 103  CO2 30 32  GLUCOSE 181* 185*  BUN 20 16  CREATININE 0.83 0.77  CALCIUM 8.1* 8.3*    PT/INR: No results for input(s): LABPROT, INR in the last 72 hours. ABG    Component Value Date/Time   PHART 7.449 12/01/2017 0744   HCO3 28.1 (H) 12/01/2017 0744   TCO2 29 12/01/2017 0744   ACIDBASEDEF 1.0 11/30/2017 1506   O2SAT 92.0 12/01/2017 0744   CBG (last 3)  Recent Labs    12/02/17 1608 12/02/17 2119 12/03/17 0837  GLUCAP 300* 254* 284*     Assessment/Plan: S/P Procedure(s) (LRB): DRAINAGE OF HEMOTHORAX (Left) VIDEO ASSISTED THORACOSCOPY (Left)  1. CV-NSR in the 27s. BP well controlled. Continue Lipitor. Holding coumadin.  2. Pulm-CXR today stable. Chest tubes put out 10ml/24 hours-remove anterior chest tube today  3. Renal-creatinine 0.77, decreasing. Electrolytes okay.  4. H and H 10.1/31.7 this morning. Platelets 426k 5. Endo-blood glucose not controlled. Increased Lantus to 18 units BID. Continue SSI.  On three oral agents at home.  6. ID- WBC 33.5, afebrile. On Rocephin.   Plan: Remove anterior chest tube. CXR in the morning. Continue pulmonary toilet. Continue nebs. Continue ambulation TID. Transfer to Tele.    LOS: 13 days    Elgie Collard 12/03/2017

## 2017-12-03 NOTE — Progress Notes (Signed)
Patient Demographics:    Oscar Baker, is a 84 y.o. male, DOB - [birth year], TDV:[id number]  Admit date - 11/20/2017   Admitting Physician Jani Gravel, MD  Outpatient Primary MD for the patient is [name], MD  LOS - 29   Chief Complaint  Patient presents with  . Cough  . Fever        Subjective:    Oscar Baker is awake and talkative,, patient's wife at bedside, ate well, had BM, ambulating,  Assessment  & Plan :    Principal Problem:   HCAP (healthcare-associated pneumonia) Active Problems:   HTN (hypertension)   Hyperlipidemia   Diabetes mellitus type 2, noninsulin dependent (HCC)   LPRD (laryngopharyngeal reflux disease)   Other allergic rhinitis   Anemia   Chronic cough   CLL (chronic lymphocytic leukemia) (HCC)   Pleural effusion   Pneumonia of left lower lobe due to infectious organism ([place])   Mediastinal lymphadenopathy   Hypoxemia   Sepsis ([place])   S/P thoracotomy               Brief Narrative:     84 year-old male with a history of CLL, prostate cancer, sick sinus syndrome, pacemaker, paroxysmal atrial fibrillation, CHF with ejection fraction 45% admitted with fever increasing shortness of breath cough with yellow to green phlegm for 1 week prior to admission. He was found to have left lower lobe pneumonia with associated effusion and is admitted for the treatment of the same.  Thoracentesis was attempted on 11/25/2017 by pulmonology service but had to be aborted due to coughing.  IR did thoracentesis on 11/26/2017 with only 70 cc fluid obtained.  CT surgery consulted , Status post left VATS drainage of empyema and decortication left lower lobe on 11/29/17 due to persistent pleural effusion despite 2 failed attempts at thoracentesis with minimal fluid drained.    Plan:- 1)HCAP/Haemophilus influenza Bacteremia complicated with loculated Lt Pleural Effusion, CT surgery consulted , had  Lt sided VATS on 11/29/17, developed significant hemothorax post VATS, went back to OR for exploration and drainage of hemothorax on 11/30/17, 32 F chest tube placed, left-sided chest tubes appear to be functioning as expected, continue IV Rocephin (started 11/21/17), pleura/lung/Wound/OR  cultures from 11/29/2017 Neg so far, blood cultures from 11/24/2017 with methicillin-resistant coagulase-negative staph, suspect contaminant, as patient improved significantly on Rocephin, persistent leukocytosis may be secondary to CLL and steroids.  Anterior chest tube removed 12/03/2017 CT surgery plans to remove posterior chest tube on 12/04/2017  2)PAFib-Coumadin on hold since 11/27/2017 to allow for VATS (patient developed hemothorax postop--- so Coumadin is still on hold, , restart Coreg 3.125 mg twice daily  3)HFrEF-last known EF about 45 to 50% from January 2019, continue Lasix 20 mg daily, restart Coreg 3.125 mg twice daily  4)CLL--persistent leukocytosis mostly secondary to underlying CLL  5)DM-glycemic control worsened with steroids, continue Lantus at 12 units nightly, use Novolog/Humalog Sliding scale insulin with Accu-Cheks/Fingersticks as ordered,    6)Acute Blood Loss Anemia due to large left-sided hemothorax in the setting of VAT procedure, received 3 units of packed cells on 11/29/2017, received 4 units of FFP on 11/29/2017, also received 2 units of packed cells on 11/30/2017, received 1 unit of FFP on 11/30/2017, and then received  one platelet transfusion on 11/30/2017, posttransfusion hemoglobin is 10.1,  clinically patient looks hemodynamically stable  7)Social/Ethics/Code Status : I have discussed the patient's wishes with the patient's wife Ms Oscar Baker  and pt's Brother Mr Oscar Baker at bedside -- Pt is a FULL CODE, they Desire/Request  life sustaining measures be used and that artificial means of life support  be employed including cardioversion, mechanical ventilation, chest compressions, pressors, and  CPR as indicated  Disposition Plan  : TBD  Consults  :  CT surgery, PCCM/IR  Procedures 11/29/17--- Lt VATS 11/29/17--- right subclavian central line placement 11/30/17--evacuation of Lt hemothorax Anterior chest tube removed 12/03/2017 CT surgery plans to remove posterior chest tube on 12/04/2017  DVT Prophylaxis  : Coumadin on hold to allow for VATs/hemothorax.... Lovenox for DVT prophylaxis for now  Lab Results  Component Value Date   PLT 426 (H) 12/03/2017    Inpatient Medications  Scheduled Meds: . acetaminophen  1,000 mg Oral Q6H   Or  . acetaminophen (TYLENOL) oral liquid 160 mg/5 mL  1,000 mg Oral Q6H  . atorvastatin  20 mg Oral q1800  . bisacodyl  10 mg Oral Daily  . Chlorhexidine Gluconate Cloth  6 each Topical Daily  . clonazePAM  0.5 mg Oral QHS  . enoxaparin (LOVENOX) injection  30 mg Subcutaneous Q24H  . feeding supplement (ENSURE ENLIVE)  237 mL Oral TID WC  . furosemide  40 mg Oral Daily  . guaiFENesin  600 mg Oral BID  . insulin aspart  0-15 Units Subcutaneous TID WC  . insulin aspart  0-5 Units Subcutaneous QHS  . insulin glargine  14 Units Subcutaneous BID  . levalbuterol  0.63 mg Nebulization TID  . metoCLOPramide (REGLAN) injection  10 mg Intravenous Q6H  . predniSONE  20 mg Oral Q breakfast  . senna-docusate  2 tablet Oral QHS  . sodium chloride flush  10-40 mL Intracatheter Q12H   Continuous Infusions: . cefTRIAXone (ROCEPHIN)  IV Stopped (12/02/17 1821)  . dextrose 5 % and 0.45% NaCl 10 mL/hr at 12/01/17 2000  . potassium chloride     PRN Meds:.ondansetron (ZOFRAN) IV, oxyCODONE, potassium chloride, sodium chloride flush, traMADol    Anti-infectives (From admission, onward)   Start     Dose/Rate Route Frequency Ordered Stop   11/30/17 1830  cefTRIAXone (ROCEPHIN) 2 g in sodium chloride 0.9 % 100 mL IVPB     2 g 200 mL/hr over 30 Minutes Intravenous Every 24 hours 11/30/17 1811     11/29/17 2200  vancomycin (VANCOCIN) 500 mg in sodium chloride  0.9 % 100 mL IVPB  Status:  Discontinued     500 mg 100 mL/hr over 60 Minutes Intravenous Every 12 hours 11/29/17 2157 11/30/17 1357   11/29/17 2200  piperacillin-tazobactam (ZOSYN) IVPB 3.375 g  Status:  Discontinued     3.375 g 12.5 mL/hr over 240 Minutes Intravenous Every 8 hours 11/29/17 2157 11/30/17 1357   11/29/17 1300  ceFAZolin (ANCEF) IVPB 2g/100 mL premix     2 g 200 mL/hr over 30 Minutes Intravenous 30 min pre-op 11/28/17 1215 11/29/17 1722   11/21/17 1600  cefTRIAXone (ROCEPHIN) 2 g in sodium chloride 0.9 % 100 mL IVPB  Status:  Discontinued     2 g 200 mL/hr over 30 Minutes Intravenous Every 24 hours 11/21/17 1553 11/29/17 2135   11/21/17 0600  vancomycin (VANCOCIN) IVPB 750 mg/150 ml premix  Status:  Discontinued     750 mg 150 mL/hr over 60 Minutes Intravenous  Every 12 hours 11/20/17 1703 11/21/17 1553   11/21/17 0230  ceFEPIme (MAXIPIME) 1 g in sodium chloride 0.9 % 100 mL IVPB  Status:  Discontinued     1 g 200 mL/hr over 30 Minutes Intravenous Every 8 hours 11/20/17 1835 11/21/17 1553   11/20/17 1930  azithromycin (ZITHROMAX) 500 mg in sodium chloride 0.9 % 250 mL IVPB  Status:  Discontinued     500 mg 250 mL/hr over 60 Minutes Intravenous Every 24 hours 11/20/17 1926 11/21/17 1553   11/20/17 1715  vancomycin (VANCOCIN) 2,000 mg in sodium chloride 0.9 % 500 mL IVPB     2,000 mg 250 mL/hr over 120 Minutes Intravenous  Once 11/20/17 1703 11/20/17 2055   11/20/17 1700  ceFEPIme (MAXIPIME) 1 g in sodium chloride 0.9 % 100 mL IVPB     1 g 200 mL/hr over 30 Minutes Intravenous  Once 11/20/17 1657 11/20/17 1854        Objective:   Vitals:   12/03/17 1200 12/03/17 1213 12/03/17 1347 12/03/17 1513  BP: 109/63  (!) 141/86   Pulse: 69  80   Resp: (!) 21  20   Temp:  98.5 F (36.9 C) 97.6 F (36.4 C)   TempSrc:  Oral Oral   SpO2: 91%  97% 96%  Weight:      Height:        Wt Readings from Last 3 Encounters:  12/03/17 92.8 kg (204 lb 9.4 oz)  11/09/17 97.2 kg (214  lb 3.2 oz)  11/04/17 96.4 kg (212 lb 8 oz)     Intake/Output Summary (Last 24 hours) at 12/03/2017 1709 Last data filed at 12/03/2017 0900 Gross per 24 hour  Intake 630 ml  Output 2075 ml  Net -1445 ml     Physical Exam  Gen:-More awake, in no acute distress  HEENT:- [place].AT, No sclera icterus Ears-very very hard of hearing  neck-Supple Neck,No JVD, right subclavian central line Lungs-left-sided chest tubes with much less drainage  CV- S1, S2 normal Abd-  +ve B.Sounds, Abd Soft, No tenderness,    Extremity/Skin:- No  edema,   good pulses Psych-alert and oriented x3 , affect is appropriate  neuro-no new focal deficits, no tremors   Data Review:   Micro Results Recent Results (from the past 240 hour(s))  Culture, blood (routine x 2)     Status: None   Collection Time: 11/24/17  1:00 PM  Result Value Ref Range Status   Specimen Description BLOOD LEFT ANTECUBITAL  Final   Special Requests   Final    BOTTLES DRAWN AEROBIC AND ANAEROBIC Blood Culture adequate volume   Culture   Final    NO GROWTH 5 DAYS Performed at [facility], [place] [address], [place], [place] [address]    Report Status 11/29/2017 FINAL  Final  Culture, blood (routine x 2)     Status: Abnormal   Collection Time: 11/24/17  1:09 PM  Result Value Ref Range Status   Specimen Description BLOOD RIGHT ANTECUBITAL  Final   Special Requests   Final    BOTTLES DRAWN AEROBIC ONLY Blood Culture results may not be optimal due to an inadequate volume of blood received in culture bottles   Culture  Setup Time   Final    GRAM POSITIVE COCCI AEROBIC BOTTLE ONLY CRITICAL RESULT CALLED TO, READ BACK BY AND VERIFIED WITH: Ferne Coe PharmD 10:25 11/25/17 ([email or web address])    Culture (A)  Final    STAPHYLOCOCCUS SPECIES (COAGULASE NEGATIVE) THE  SIGNIFICANCE OF ISOLATING THIS ORGANISM FROM A SINGLE SET OF BLOOD CULTURES WHEN MULTIPLE SETS ARE DRAWN IS UNCERTAIN. PLEASE NOTIFY THE MICROBIOLOGY DEPARTMENT WITHIN ONE WEEK IF  SPECIATION AND SENSITIVITIES ARE REQUIRED. Performed at [facility], [place] [address] [place], [place] [address]    Report Status 11/27/2017 FINAL  Final  Blood Culture ID Panel (Reflexed)     Status: Abnormal   Collection Time: 11/24/17  1:09 PM  Result Value Ref Range Status   Enterococcus species NOT DETECTED NOT DETECTED Final   Listeria monocytogenes NOT DETECTED NOT DETECTED Final   Staphylococcus species DETECTED (A) NOT DETECTED Final    Comment: Methicillin (oxacillin) resistant coagulase negative staphylococcus. Possible blood culture contaminant (unless isolated from more than one blood culture draw or clinical case suggests pathogenicity). No antibiotic treatment is indicated for blood  culture contaminants. CRITICAL RESULT CALLED TO, READ BACK BY AND VERIFIED WITH: Ferne Coe PharmD 10:25 11/25/17 ([email or web address])    Staphylococcus aureus NOT DETECTED NOT DETECTED Final   Methicillin resistance DETECTED (A) NOT DETECTED Final    Comment: CRITICAL RESULT CALLED TO, READ BACK BY AND VERIFIED WITH: Ferne Coe PharmD 10:25 11/25/17 ([email or web address])    Streptococcus species NOT DETECTED NOT DETECTED Final   Streptococcus agalactiae NOT DETECTED NOT DETECTED Final   Streptococcus pneumoniae NOT DETECTED NOT DETECTED Final   Streptococcus pyogenes NOT DETECTED NOT DETECTED Final   Acinetobacter baumannii NOT DETECTED NOT DETECTED Final   Enterobacteriaceae species NOT DETECTED NOT DETECTED Final   Enterobacter cloacae complex NOT DETECTED NOT DETECTED Final   Escherichia coli NOT DETECTED NOT DETECTED Final   Klebsiella oxytoca NOT DETECTED NOT DETECTED Final   Klebsiella pneumoniae NOT DETECTED NOT DETECTED Final   Proteus species NOT DETECTED NOT DETECTED Final   Serratia marcescens NOT DETECTED NOT DETECTED Final   Haemophilus influenzae NOT DETECTED NOT DETECTED Final   Neisseria meningitidis NOT DETECTED NOT DETECTED Final   Pseudomonas aeruginosa NOT DETECTED NOT DETECTED Final    Candida albicans NOT DETECTED NOT DETECTED Final   Candida glabrata NOT DETECTED NOT DETECTED Final   Candida krusei NOT DETECTED NOT DETECTED Final   Candida parapsilosis NOT DETECTED NOT DETECTED Final   Candida tropicalis NOT DETECTED NOT DETECTED Final    Comment: Performed at [facility], [address], [place], [place] [address]  Gram stain     Status: None   Collection Time: 11/25/17  3:43 PM  Result Value Ref Range Status   Specimen Description FLUID PLEURAL  Final   Special Requests NONE  Final   Gram Stain   Final    ABUNDANT WBC PRESENT,BOTH PMN AND MONONUCLEAR NO ORGANISMS SEEN Performed at [facility], [address], [place], [place] [address]    Report Status 11/25/2017 FINAL  Final  Body fluid culture     Status: None   Collection Time: 11/26/17  1:01 PM  Result Value Ref Range Status   Specimen Description PLEURAL FLUID LEFT  Final   Special Requests NONE  Final   Gram Stain   Final    MODERATE WBC PRESENT,BOTH PMN AND MONONUCLEAR NO ORGANISMS SEEN    Culture   Final    NO GROWTH 3 DAYS Performed at [facility], [place] [address] [place], [place] [address]    Report Status 11/29/2017 FINAL  Final  Surgical pcr screen     Status: None   Collection Time: 11/27/17  2:54 PM  Result Value Ref Range Status  MRSA, PCR NEGATIVE NEGATIVE Final   Staphylococcus aureus NEGATIVE NEGATIVE Final    Comment: (NOTE) The Xpert SA Assay (FDA approved for NASAL specimens in patients 70 years of age and older), is one component of a comprehensive surveillance program. It is not intended to diagnose infection nor to guide or monitor treatment. Performed at [facility], [place] [address], [place], [place] [address]   Fungus Culture With Stain     Status: None (Preliminary result)   Collection Time: 11/29/17  5:47 PM  Result Value Ref Range Status   Fungus Stain Final report  Final    Comment: (NOTE) Performed At: [facility] [place], [name] [id number] Rush Farmer MD OE:[id number]    Fungus (Mycology) Culture PENDING  Incomplete   Fungal Source FLUID  Final    Comment: LEFT PLEURAL Performed at [facility], [place] [address] [place], [place] [address]   Aerobic/Anaerobic Culture (surgical/deep wound)     Status: None (Preliminary result)   Collection Time: 11/29/17  5:47 PM  Result Value Ref Range Status   Specimen Description FLUID LEFT PLEURAL  Final   Special Requests NONE  Final   Gram Stain   Final    RARE WBC PRESENT, PREDOMINANTLY PMN NO ORGANISMS SEEN    Culture   Final    NO GROWTH 4 DAYS NO ANAEROBES ISOLATED; CULTURE IN PROGRESS FOR 5 DAYS Performed at [facility], [place] [address] [place], [place] [address]    Report Status PENDING  Incomplete  Acid Fast Smear (AFB)     Status: None   Collection Time: 11/29/17  5:47 PM  Result Value Ref Range Status   AFB Specimen Processing Concentration  Final   Acid Fast Smear Negative  Final    Comment: (NOTE) Performed At: [facility] [place], [name] [id number] Rush Farmer MD PZ:[id number]    Source (AFB) FLUID  Final    Comment: LEFT PLEURAL Performed at [facility], [place] [address], [place], [place] [address]   Fungus Culture Result     Status: None   Collection Time: 11/29/17  5:47 PM  Result Value Ref Range Status   Result 1 Comment  Final    Comment: (NOTE) KOH/Calcofluor preparation:  no fungus observed. Performed At: [facility] [place], [name] [id number] Rush Farmer MD GQ:[id number] Performed at [facility], [place] [address], [place], [place] [address]   Aerobic/Anaerobic Culture (surgical/deep wound)     Status: None (Preliminary result)   Collection Time: 11/29/17  5:55 PM  Result Value Ref Range Status   Specimen Description TISSUE  Final   Special Requests LEFT PLEURAL PEEL  Final   Gram Stain   Final    MODERATE WBC PRESENT,  PREDOMINANTLY PMN NO ORGANISMS SEEN    Culture   Final    NO GROWTH 4 DAYS NO ANAEROBES ISOLATED; CULTURE IN PROGRESS FOR 5 DAYS Performed at [facility], [place] [address], [place], [place] [address]    Report Status PENDING  Incomplete    Radiology Reports Dg Chest 1 View  Result Date: 11/26/2017 CLINICAL DATA:  Status post thoracentesis. EXAM: CHEST  1 VIEW COMPARISON:  Yesterday. FINDINGS: Significant decrease in amount of left pleural fluid following thoracentesis. No pneumothorax. Stable enlarged cardiac silhouette. Dense airspace opacity at the left lung base, increased. Clear right lung with an overlying skin fold. Bilateral shoulder degenerative changes. IMPRESSION: 1. No  pneumothorax following left thoracentesis. 2. Dense left lower lobe atelectasis or pneumonia, significantly increased. 3. Stable cardiomegaly. Electronically Signed   By: Claudie Revering M.D.   On: 11/26/2017 13:06   Dg Chest 1 View  Result Date: 11/23/2017 CLINICAL DATA:  Hypoxia.  Atrial fibrillation. EXAM: CHEST  1 VIEW COMPARISON:  November 22, 2017 FINDINGS: There is opacity throughout much of the left lung, in part due to pleural effusion. There is also patchy airspace consolidation throughout much of the left lung. There is slight atelectatic change in the right base. The right lung is otherwise clear. Heart is mildly enlarged with pulmonary vascularity within normal limits. Pacemaker lead is attached to the right ventricle. No adenopathy. There is aortic atherosclerosis. There is degenerative change in each shoulder. IMPRESSION: Opacity throughout much of the left lung likely due to a combination of pleural effusion and airspace consolidation, essentially stable. Slight right base atelectasis. Right lung otherwise clear. Stable cardiomegaly. Stable pacemaker placement. There is aortic atherosclerosis. Aortic Atherosclerosis (ICD10-I70.0). Electronically Signed   By: Lowella Grip III M.D.   On: 11/23/2017 08:17    Dg Chest 1 View  Result Date: 11/22/2017 CLINICAL DATA:  Progressive cough, congestion, and shortness of breath. Fall. EXAM: CHEST  1 VIEW COMPARISON:  Two-view chest x-ray 11/20/2017 FINDINGS: The heart size is exaggerated by low lung volumes. Increased asymmetric left-sided interstitial and airspace disease is noted. A left pleural effusion is present. Right-sided interstitial disease is increasing. IMPRESSION: 1. Increasing interstitial and airspace disease in the left lung consistent with edema and infection. 2. Increasing left pleural effusion. 3. Progressive edema in the right lung. Electronically Signed   By: San Morelle M.D.   On: 11/22/2017 07:34   Dg Chest 2 View  Result Date: 11/20/2017 CLINICAL DATA:  84 year old with chronic cough, presenting with acute worsening, associated with congestion, shortness of breath and fever. EXAM: CHEST - 2 VIEW COMPARISON:  07/21/2017, 07/19/2017, 01/08/2015 and earlier. FINDINGS: AP ERECT and LATERAL images were obtained. LEFT subclavian single lead transvenous pacemaker with the lead tip at the expected location of the RV apex, unchanged. Cardiac silhouette moderately enlarged, unchanged since earlier this year but increased in size since 2016. Thoracic aorta mildly atherosclerotic, unchanged. Hilar and mediastinal contours otherwise unremarkable. Airspace consolidation involving the LEFT lower lobe associated with a LEFT pleural effusion. Pulmonary venous hypertension without overt edema. Degenerative changes involving the thoracic and UPPER lumbar spine. IMPRESSION: 1. Acute LEFT lower lobe pneumonia with an associated LEFT pleural effusion. 2. Stable moderate cardiomegaly. Pulmonary venous hypertension without overt edema. Electronically Signed   By: Evangeline Dakin M.D.   On: 11/20/2017 14:10   Ct Head Wo Contrast  Result Date: 11/20/2017 CLINICAL DATA:  Tripped and fell 3 days ago, hitting the head. Bruising to the right temple. On  anticoagulation. Initial encounter. EXAM: CT HEAD WITHOUT CONTRAST TECHNIQUE: Contiguous axial images were obtained from the base of the skull through the vertex without intravenous contrast. COMPARISON:  07/20/2017 FINDINGS: Brain: There is no evidence of acute infarct, intracranial hemorrhage, mass, midline shift, or extra-axial fluid collection. Mild cerebral atrophy is unchanged. Periventricular white matter hypodensities are also unchanged and nonspecific but compatible with mild chronic small vessel ischemic disease. Vascular: Calcified atherosclerosis at the skull base. No hyperdense vessel. Skull: No fracture or focal osseous lesion. Sinuses/Orbits: Improved paranasal sinus aeration. Moderate residual right ethmoid and right maxillary sinus mucosal thickening. Large right mastoid effusion, mildly increased from prior. Resolved right middle ear opacification. At most trace fluid  at the left mastoid tip. Other: Small right frontotemporal scalp hematoma. IMPRESSION: 1. No evidence of acute intracranial abnormality. 2. Small right frontotemporal scalp hematoma. 3. Mild chronic small vessel ischemic disease. 4. Persistent large right mastoid effusion. Resolved right middle ear effusion. 5. Improved paranasal sinus aeration. Electronically Signed   By: Logan Bores M.D.   On: 11/20/2017 15:14   Ct Chest Wo Contrast  Result Date: 11/23/2017 CLINICAL DATA:  Left pleural effusion. EXAM: CT CHEST WITHOUT CONTRAST TECHNIQUE: Multidetector CT imaging of the chest was performed following the standard protocol without IV contrast. COMPARISON:  Chest x-ray from same day. CT chest dated April 17, 2014. FINDINGS: Cardiovascular: Mild cardiomegaly with unchanged left chest wall pacemaker. No pericardial effusion. Normal caliber thoracic aorta. Coronary, aortic arch, and branch vessel atherosclerotic vascular disease. Mediastinum/Nodes: Multiple prominent bilateral axillary, left supraclavicular, and mediastinal lymph  nodes measuring up to 10 mm in short axis are overall similar to prior study. The thyroid gland trachea, and esophagus demonstrate no significant findings. Lungs/Pleura: Moderate to large left pleural effusion. Trace right pleural effusion. Complete collapse of the left lower lobe. Areas of mucous impaction in the right lower lobe with subsegmental atelectasis. Mild diffuse peribronchial thickening. No consolidation or pneumothorax. No suspicious pulmonary nodule. Mild mosaic attenuation throughout the lungs. Upper Abdomen: No acute abnormality. Musculoskeletal: No chest wall abnormality. No acute or significant osseous findings. Degenerative changes of the thoracic spine. IMPRESSION: 1. Moderate to large left pleural effusion with complete collapse of the left lower lobe. 2. Trace right pleural effusion with right lower lobe subsegmental atelectasis. 3. Mild mosaic attenuation throughout the lungs could reflect chronic small airways or small vessel disease. 4. Prominent bilateral axillary, left supraclavicular, and mediastinal lymph nodes measuring up to 10 mm in short axis are overall similar to prior study and likely reflect patient's history of chronic lymphocytic leukemia. 5.  Aortic atherosclerosis (ICD10-I70.0). Electronically Signed   By: Titus Dubin M.D.   On: 11/23/2017 18:00   Dg Chest Port 1 View  Result Date: 12/03/2017 CLINICAL DATA:  Follow-up chest tube EXAM: PORTABLE CHEST 1 VIEW COMPARISON:  12/02/2017 FINDINGS: Two chest tubes are noted on the left. No pneumothorax is noted. Pacing device is again seen and stable. Cardiac shadow is stable. Right subclavian central line is seen. Right lung remains clear. Improved aeration on the left is noted with some mild retrocardiac atelectasis. IMPRESSION: Improved aeration on the left. Tubes and lines as described. Electronically Signed   By: Inez Catalina M.D.   On: 12/03/2017 06:57   Dg Chest Port 1 View  Result Date: 12/02/2017 CLINICAL DATA:   Chest tube EXAM: PORTABLE CHEST 1 VIEW COMPARISON:  12/01/2017 FINDINGS: Left chest tubes remain in place, unchanged. No pneumothorax. Pacer is unchanged as is right central line. Mild cardiomegaly with vascular congestion and bibasilar atelectasis. IMPRESSION: Stable cardiomegaly with vascular congestion and bibasilar atelectasis. No pneumothorax. Electronically Signed   By: Rolm Baptise M.D.   On: 12/02/2017 07:51   Dg Chest Port 1 View  Result Date: 12/01/2017 CLINICAL DATA:  Status post VATS for empyema 11/29/2017. Chest tube in place. EXAM: PORTABLE CHEST 1 VIEW COMPARISON:  Single-view of the chest 11/30/2017 and 11/29/2017. FINDINGS: Right subclavian central venous catheter and 2 left chest tubes remain in place. Small left pleural effusion and basilar airspace disease are unchanged. No pneumothorax. Subsegmental atelectasis in the right base has improved since yesterday's examination. Heart size is mildly enlarged. Aortic atherosclerosis is noted. IMPRESSION: Negative for pneumothorax with  left chest tubes in place. No change in a small left effusion and basilar airspace disease. Improved right basilar subsegmental atelectasis. Electronically Signed   By: Inge Rise M.D.   On: 12/01/2017 09:36   Dg Chest Port 1 View  Result Date: 11/30/2017 CLINICAL DATA:  Status post thoracotomy. History of leukemia, atrial fibrillation. EXAM: PORTABLE CHEST 1 VIEW COMPARISON:  Chest radiograph November 30, 2017 FINDINGS: Stable cardiomegaly. Two LEFT apical chest tubes in place. LEFT apical pleural thickening. No pneumothorax. RIGHT subclavian central venous catheter distal tip projects in distal superior vena cava. Bibasilar strandy densities. Single lead LEFT cardiac pacemaker. Similar LEFT chest wall subcutaneous gas. IMPRESSION: Stable life-support lines including 2 LEFT apical chest tubes. LEFT apical pleural thickening and bibasilar atelectasis. No pneumothorax. Stable cardiomegaly. Electronically Signed    By: Elon Alas M.D.   On: 11/30/2017 14:25   Dg Chest Port 1 View  Result Date: 11/30/2017 CLINICAL DATA:  Status post VATS on the left EXAM: PORTABLE CHEST 1 VIEW COMPARISON:  11/30/2017 FINDINGS: Pacemaker is again identified and stable. Right central venous line is noted and stable. Two chest tubes are seen on the left. Significant improved aeration in the left is noted when compared with the prior exam. Degenerative changes of the thoracic spine are noted. IMPRESSION: Significant improved aeration on the left. Electronically Signed   By: Inez Catalina M.D.   On: 11/30/2017 13:50   Dg Chest Port 1 View  Result Date: 11/30/2017 CLINICAL DATA:  Central line placement. EXAM: PORTABLE CHEST 1 VIEW COMPARISON:  Radiograph earlier this day at 0014 hour FINDINGS: Right subclavian central line with tip in the distal SVC. No right pneumothorax. Left chest tube is unchanged in position with tip directed towards the apex. Slight decreased density of the suspected extrapleural left lung opacity. Decreased conspicuity of the pneumothorax versus pneumomediastinum adjacent to the left heart border. Linear atelectasis in the right midlung versus fluid in the fissure. IMPRESSION: 1. Right subclavian central line with tip in the distal SVC. No right pneumothorax. 2. Slightly decreased density of the presumed extrapleural opacity in the left hemithorax. Left chest tubes remain in place. Decreasing conspicuity of the pneumomediastinum versus medial pneumothorax adjacent to the left heart border. Electronically Signed   By: Jeb Levering M.D.   On: 11/30/2017 04:28   Dg Chest Port 1 View  Result Date: 11/30/2017 CLINICAL DATA:  Thoracotomy scar left chest. EXAM: PORTABLE CHEST 1 VIEW COMPARISON:  Radiograph yesterday. FINDINGS: Significant change from prior exam with near complete opacification of the left hemithorax, opacity primarily laterally and superiorly with lobular borders, suggesting extrapleural  process. Two left chest tubes are in place. Pneumomediastinum versus medial pneumothorax along the left heart border. Left-sided pacemaker in place. Mild cardiomegaly is similar. Right lung is grossly clear. IMPRESSION: 1. Significant change from prior exam with near complete opacification of left hemithorax, opacity primarily peripheral and superiorly with lobular borders suggesting extrapleural. Given recent thoracoscopy, hemothorax is primary concern. 2. Pneumomediastinum versus pneumothorax along the left heart border. These results will be called to the ordering clinician or representative by the Radiologist Assistant, and communication documented in the PACS or zVision Dashboard. Electronically Signed   By: Jeb Levering M.D.   On: 11/30/2017 01:14   Dg Chest Port 1 View  Result Date: 11/29/2017 CLINICAL DATA:  Postop VATS.  Empyema. EXAM: PORTABLE CHEST 1 VIEW COMPARISON:  Chest radiograph 11/29/2017 FINDINGS: Left-sided chest tube and drain tips are at the left lung apex. There is bibasilar  atelectasis without focal consolidation. No pneumothorax or sizable pleural effusion. Unchanged cardiomegaly. IMPRESSION: No pneumothorax. Electronically Signed   By: Ulyses Jarred M.D.   On: 11/29/2017 20:20   Dg Chest Port 1 View  Result Date: 11/29/2017 CLINICAL DATA:  Shortness of Breath EXAM: PORTABLE CHEST 1 VIEW COMPARISON:  11/27/2017 FINDINGS: Cardiac shadow is stable. Pacing device is again seen. Right lung is clear. Small left pleural effusion is noted. There is a rounded area of density noted laterally likely related to a loculated component. No bony abnormality is seen. IMPRESSION: Stable appearing left pleural effusion with some degree of loculation. Electronically Signed   By: Inez Catalina M.D.   On: 11/29/2017 08:59   Dg Chest Port 1 View  Result Date: 11/27/2017 CLINICAL DATA:  Pneumonia EXAM: PORTABLE CHEST 1 VIEW COMPARISON:  November 26, 2017 FINDINGS: There is layering pleural effusion on the  left. There is consolidation in the left base. There is mild right base atelectasis. Right lung otherwise is clear. Heart is mildly enlarged with pulmonary vascularity normal. Pacemaker lead attached to right ventricle. No pneumothorax. No adenopathy. No bone lesions. IMPRESSION: Layering pleural effusion on the left with persistent consolidation left base. Mild right base atelectasis. Stable cardiac prominence. Electronically Signed   By: Lowella Grip III M.D.   On: 11/27/2017 09:27   Dg Chest Port 1 View  Result Date: 11/25/2017 CLINICAL DATA:  84 year old male with shortness of breath and pleural effusion. EXAM: PORTABLE CHEST 1 VIEW COMPARISON:  11/23/2017 CT, chest radiograph and prior studies FINDINGS: Cardiomediastinal silhouette is unchanged. A LEFT pacemaker again noted. LEFT pleural effusion and LEFT LOWER lung consolidation/atelectasis again noted. The RIGHT lung is clear. No pneumothorax or acute bony abnormality. IMPRESSION: Little significant change in appearance of the chest with continued LEFT pleural effusion and LEFT LOWER lung atelectasis/consolidation. Electronically Signed   By: Margarette Canada M.D.   On: 11/25/2017 16:53   Dg Swallowing Func-speech Pathology  Result Date: 11/23/2017 Objective Swallowing Evaluation: Type of Study: MBS-Modified Barium Swallow Study  Patient Details Name: DEMONTEZ NOVACK MRN: [id number] Date of Birth: July 07, 1932 Today's Date: 11/23/2017 Time: SLP Start Time (ACUTE ONLY): 8250 -SLP Stop Time (ACUTE ONLY): 1637 SLP Time Calculation (min) (ACUTE ONLY): 53 min Past Medical History: Past Medical History: Diagnosis Date . Asthma 1950's  history of . Atrial fibrillation ([place])  . Bilateral carpal tunnel syndrome  . Bilateral lower extremity edema  . Bladder tumor  . Chronic systolic heart failure (HCC)   Echo 1/19: Mild LVH, EF 45-50, inf HK, MAC, severe LAE, severe RAE // Echo 7/15: Mild LVH, mod focal basal sept hypertrophy, EF 55-60, AV peak and mean 16/9, trivial  MR, mod LAE, PASP 38 . CLL (chronic lymphocytic leukemia) [facility]) oncologist-  dr Ilene Qua--  dx [phone] ;  Lymphocytosis, CLL - per lov note 05-11-2017 currently under active survillance,  CT 04-17-2014 show very small lymphadenopathy, no indication for treatment . Coronary artery disease   cardiologist-  dr Cathie Olden--  08-18-2017 Intermittant risk nuclear study w/ large area of inferior infartion with no evidence ishcemia  . Deafness in right ear  . Diabetes mellitus type 2, noninsulin dependent ([place])  . Elevated PSA   since prostatectomy but now resolved . Hematuria 04/2017 . History of ear infection   Right . History of MI (myocardial infarction)   per myoview nuclear study 08-18-2017 , unknown when . History of shingles 08/2017  L ear and scalp, possible . Hyperlipidemia  . Hypertension  . Ischemic  cardiomyopathy 09/01/2017  Presumed +CAD with Nuclear stress test 08/18/17 - Inferior scar, no ischemia, intermediate risk // med management unless +angina or worse dyspnea . OA (osteoarthritis)  . Pacemaker 02/08/2014  followed by dr g. taylor--  single chamber Biotronik due to SSS . Permanent atrial fibrillation ([place])  . Pneumonia 2019  Left lung . Prostate cancer [facility]) urologist-  dr Diona Fanti  dx 2004--  Gleason 8, PSA 10.45--  11-28-2002  s/p  radical prostatectomy;  recurrent w/ increasing PSA, started ADT treatment . RBBB (right bundle branch block)  . Sick sinus syndrome ([place])   a-Flutter with episodes of bradycardia; S/P Biotronik (serial number [id number]) 02-08-2014 . Urinary incontinence  . Wears hearing aid in right ear   receiver and transmitter Past Surgical History: Past Surgical History: Procedure Laterality Date . APPENDECTOMY   . BACK SURGERY    disk . CARDIAC CATHETERIZATION  09-03-1999  dr Cathie Olden  abnormal cardiolite study:  minor luminal irregularities but no critial coronary artery stenosis . CARDIOVASCULAR STRESS TEST  08-18-2017  dr Cathie Olden  Intermediate risk nuclear study w/ large area inferior  infarction, no evidence of ishcemia (consistant w/ prior MI)/  study not gated due to frequent PVCs . CARPAL TUNNEL RELEASE Right 2000 . CARPAL TUNNEL RELEASE Left 11/19/2009 . CATARACT EXTRACTION Right 07/2015 . CATARACT EXTRACTION Left 09/2015 . CYSTOSCOPY N/A 11/04/2017  Procedure: CYSTOSCOPY AND CAUTERIZATION OF BLADDER;  Surgeon: Franchot Gallo, MD;  Location[facility];  Service: Urology;  Laterality: N/A; . INSERTION PENILE PROSTHESIS  02-22-2004    dr Mattie Marlin  [facility] . KNEE ARTHROSCOPY Left 07/2010 . PERMANENT PACEMAKER INSERTION N/A 02/08/2014  Procedure: PERMANENT PACEMAKER INSERTION;  Surgeon: Evans Lance, MD;  Location: [facility] CATH LAB;  Service: Cardiovascular;  Laterality: N/A; . RADICAL RETROPUBIC PROSTATECTOMY W/ BILATERAL PELVIC LYMPH NODE DISSECTION  11-28-2002   dr Mattie Marlin  [facility] . TONSILLECTOMY   . TOTAL HIP ARTHROPLASTY Left 05/12/2016  Procedure: LEFT TOTAL HIP ARTHROPLASTY ANTERIOR APPROACH;  Surgeon: Paralee Cancel, MD;  Location: [facility] ORS;  Service: Orthopedics;  Laterality: Left; . TOTAL HIP ARTHROPLASTY Right 07-15-2006   dr Alvan Dame  [facility] . TRANSTHORACIC ECHOCARDIOGRAM  07/20/2017  mild LVH, ef 45-50%, hypokinesis of the basal-midinferior myocardium, due to AFib unable to evaluate diastolic function/  severe LAE and RAE/  trivial PR and TR HPI: Pt is an 84 year-old male with a history of CLL, prostate cancer, sick sinus syndrome, pacemaker, paroxysmal atrial fibrillation, CHF with ejection fraction 45% admitted with LLL PNA. Per consult note from critical care, pt/wife describe cyclic cough for over a year, with coughing sometimes escalating during meals, especially with dry foods.  Subjective: alert, denies swallowing difficulty Assessment / Plan / Recommendation CHL IP CLINICAL IMPRESSIONS 11/23/2017 Clinical Impression Pt has a mild oropharyngeal dysphagia with structural component due to suspected osteophytes most prominent at C3-4, C4-5 that impede full epiglottic deflection  and airway closure. What further impacts his ability to protect his airway is his impulsive intake (large boluses and fast rate) and premature spillage. Thin and nectar thick liquids spill into the pharynx, filling the valleculae and spilling onward to the pyriform sinuses before the swallow. Then, as he swallows, the thin liquids spills into the partially open airway, allowing for trace amounts of penetration and aspiration. Pt had significant baseline coughing that made it very challenging to determine whether coughing was related to aspiration or not. SLP provided Mod cues for attempts at a chin tuck, oral holding, and smaller bolus sizes.  Small sips were the most effective at protecting the airway with thin liquids although I suspect if he could have performed the oral hold that this would further increase his safety with swallowing. Unfortunately, pt could not consistently utilize these strategies. No airway compromise occurred with thicker liquids or solids, although pt did have to take intermittent pauses during oral prep with solids to catch his breath. Although pt's dysphagia is mild, considering his impulsivity as well as his recurrent PNA and chronic cough, would favor starting a more conservative diet at this time: Dys 3 diet and nectar thick liquids. SLP will f/u to facilitate hopeful transition back to thin liquids with improved ability to take small sips, contain liquids orally, and increase oral care/use of aspiration precautions.  SLP Visit Diagnosis Dysphagia, oropharyngeal phase (R13.12) Attention and concentration deficit following -- Frontal lobe and executive function deficit following -- Impact on safety and function Mild aspiration risk   CHL IP TREATMENT RECOMMENDATION 11/23/2017 Treatment Recommendations Therapy as outlined in treatment plan below   Prognosis 11/23/2017 Prognosis for Safe Diet Advancement Good Barriers to Reach Goals -- Barriers/Prognosis Comment -- CHL IP DIET RECOMMENDATION  11/23/2017 SLP Diet Recommendations Dysphagia 3 (Mech soft) solids;Nectar thick liquid Liquid Administration via Cup Medication Administration Whole meds with puree Compensations Slow rate;Small sips/bites Postural Changes Seated upright at 90 degrees   CHL IP OTHER RECOMMENDATIONS 11/23/2017 Recommended Consults -- Oral Care Recommendations Oral care BID Other Recommendations Order thickener from pharmacy;Prohibited food (jello, ice cream, thin soups);Remove water pitcher   CHL IP FOLLOW UP RECOMMENDATIONS 11/23/2017 Follow up Recommendations Outpatient SLP;Home health SLP   CHL IP FREQUENCY AND DURATION 11/23/2017 Speech Therapy Frequency (ACUTE ONLY) min 2x/week Treatment Duration 2 weeks      CHL IP ORAL PHASE 11/23/2017 Oral Phase Impaired Oral - Pudding Teaspoon -- Oral - Pudding Cup -- Oral - Honey Teaspoon -- Oral - Honey Cup -- Oral - Nectar Teaspoon -- Oral - Nectar Cup Decreased bolus cohesion;Premature spillage Oral - Nectar Straw -- Oral - Thin Teaspoon -- Oral - Thin Cup Decreased bolus cohesion;Premature spillage Oral - Thin Straw Decreased bolus cohesion;Premature spillage Oral - Puree Piecemeal swallowing Oral - Mech Soft Delayed oral transit Oral - Regular -- Oral - Multi-Consistency -- Oral - Pill -- Oral Phase - Comment --  CHL IP PHARYNGEAL PHASE 11/23/2017 Pharyngeal Phase Impaired Pharyngeal- Pudding Teaspoon -- Pharyngeal -- Pharyngeal- Pudding Cup -- Pharyngeal -- Pharyngeal- Honey Teaspoon -- Pharyngeal -- Pharyngeal- Honey Cup -- Pharyngeal -- Pharyngeal- Nectar Teaspoon -- Pharyngeal -- Pharyngeal- Nectar Cup Reduced epiglottic inversion;Reduced airway/laryngeal closure;Delayed swallow initiation-pyriform sinuses Pharyngeal -- Pharyngeal- Nectar Straw -- Pharyngeal -- Pharyngeal- Thin Teaspoon -- Pharyngeal -- Pharyngeal- Thin Cup Reduced epiglottic inversion;Reduced airway/laryngeal closure;Delayed swallow initiation-pyriform sinuses;Penetration/Aspiration during swallow Pharyngeal Material enters  airway, CONTACTS cords and not ejected out Pharyngeal- Thin Straw Reduced epiglottic inversion;Reduced airway/laryngeal closure;Delayed swallow initiation-pyriform sinuses;Penetration/Aspiration during swallow Pharyngeal Material enters airway, passes BELOW cords without attempt by patient to eject out (silent aspiration) Pharyngeal- Puree Reduced epiglottic inversion;Reduced airway/laryngeal closure Pharyngeal -- Pharyngeal- Mechanical Soft Reduced epiglottic inversion;Reduced airway/laryngeal closure Pharyngeal -- Pharyngeal- Regular -- Pharyngeal -- Pharyngeal- Multi-consistency -- Pharyngeal -- Pharyngeal- Pill -- Pharyngeal -- Pharyngeal Comment --  CHL IP CERVICAL ESOPHAGEAL PHASE 11/23/2017 Cervical Esophageal Phase WFL Pudding Teaspoon -- Pudding Cup -- Honey Teaspoon -- Honey Cup -- Nectar Teaspoon -- Nectar Cup -- Nectar Straw -- Thin Teaspoon -- Thin Cup -- Thin Straw -- Puree -- Mechanical Soft -- Regular -- Multi-consistency -- Pill --  Cervical Esophageal Comment -- No flowsheet data found. [name] 11/23/2017, 5:04 PM  [name], M.A. CCC-SLP [phone]             Ir Thoracentesis Asp Pleural Space W/img Guide  Result Date: 11/26/2017 INDICATION: Patient with left pleural effusion. Request is made for diagnostic and therapeutic thoracentesis. EXAM: ULTRASOUND GUIDED DIAGNOSTIC AND THERAPEUTIC LEFT THORACENTESIS MEDICATIONS: 10 mL 2% lidocaine COMPLICATIONS: None immediate. PROCEDURE: An ultrasound guided thoracentesis was thoroughly discussed with the patient and questions answered. The benefits, risks, alternatives and complications were also discussed. The patient understands and wishes to proceed with the procedure. Written consent was obtained. Ultrasound was performed to localize and mark an adequate pocket of fluid in the left chest. The area was then prepped and draped in the normal sterile fashion. 2% lidocaine was used for local anesthesia. Under ultrasound guidance a  Safe-T-Centesis catheter was introduced. Thoracentesis was performed. The catheter was removed and a dressing applied. FINDINGS: A total of approximately 70 mL of cloudy yellow fluid was removed. Samples were sent to the laboratory as requested by the clinical team. IMPRESSION: Successful ultrasound guided diagnostic and therapeutic left thoracentesis yielding 70 mL of pleural fluid. Read by: Brynda Greathouse PA-C Electronically Signed   By: Markus Daft M.D.   On: 11/26/2017 14:15     CBC Recent Labs  Lab 11/27/17 0240 11/28/17 0257 11/29/17 0211  11/30/17 1434 11/30/17 2242 12/01/17 0430 12/02/17 0419 12/03/17 0440  WBC 37.7* 47.4* 51.3*   < > 45.5* 32.5* 30.1* 30.4* 33.5*  HGB 10.2* 10.3* 10.3*   < > 10.2* 8.8* 8.5* 9.1* 10.1*  HCT 30.2* 31.1* 31.5*   < > 31.0* 26.3* 25.5* 28.8* 31.7*  PLT 631* 671* 715*   < > 352 353 333 359 426*  MCV 95.9 96.9 98.1   < > 90.9 89.8 90.7 95.0 94.3  MCH 32.4 32.1 32.1   < > 29.9 30.0 30.2 30.0 30.1  MCHC 33.8 33.1 32.7   < > 32.9 33.5 33.3 31.6 31.9  RDW 18.6* 18.7* 18.9*   < > 19.1* 18.8* 18.9* 19.1* 18.6*  LYMPHSABS 26.8* 31.8* 34.4*  --   --   --   --   --   --   MONOABS 1.1* 1.9* 1.0  --   --   --   --   --   --   EOSABS 0.0 0.0 0.0  --   --   --   --   --   --   BASOSABS 0.0 0.0 0.0  --   --   --   --   --   --    < > = values in this interval not displayed.    Chemistries  Recent Labs  Lab 11/28/17 1301  11/30/17 2242 12/01/17 0430 12/01/17 2000 12/01/17 2335 12/02/17 0419 12/03/17 0440  NA 135   < > 137 139 136  --  139 138  K 5.3*   < > 3.8 3.9 6.2* 4.3 4.2 4.1  CL 97*   < > 103 107 102  --  102 103  CO2 28   < > _0 --  30 32  GLUCOSE 320*   < > 168* 129* 334*  --  181* 185*  BUN 30*   < > 37* 34* 27*  --  20 16  CREATININE 1.01   < > 1.37* 1.21 1.02  --  0.83 0.77  CALCIUM 9.1   < > 7.8* 7.7*  7.9*  --  8.1* 8.3*  AST 22  --   --   --   --   --  36 25  ALT 20  --   --   --   --   --  33 29  ALKPHOS 77  --   --   --   --    --  60 65  BILITOT 1.0  --   --   --   --   --  0.7 0.8   < > = values in this interval not displayed.   ------------------------------------------------------------------------------------------------------------------ No results for input(s): CHOL, HDL, LDLCALC, TRIG, CHOLHDL, LDLDIRECT in the last 72 hours.  Lab Results  Component Value Date   HGBA1C 6.0 (H) 05/05/2016   ------------------------------------------------------------------------------------------------------------------ No results for input(s): TSH, T4TOTAL, T3FREE, THYROIDAB in the last 72 hours.  Invalid input(s): FREET3 ------------------------------------------------------------------------------------------------------------------ No results for input(s): VITAMINB12, FOLATE, FERRITIN, TIBC, IRON, RETICCTPCT in the last 72 hours.  Coagulation profile Recent Labs  Lab 11/27/17 0240 11/28/17 0257 11/29/17 0211 11/30/17 0226 11/30/17 0634  INR 1.44 1.38 1.33 2.06 1.40    No results for input(s): DDIMER in the last 72 hours.  Cardiac Enzymes No results for input(s): CKMB, TROPONINI, MYOGLOBIN in the last 168 hours.  Invalid input(s): CK ------------------------------------------------------------------------------------------------------------------    Component Value Date/Time   BNP 717.8 (H) 11/20/2017 1339     Roxan Hockey M.D on 12/03/2017 at 5:09 PM  Between 7am to 7pm - Pager - [phone]  After 7pm go to [email or web address] - password [email or web address]  [facility] -  Office  [phone]   Voice Recognition Viviann Spare dictation system was used to create this note, attempts have been made to correct errors. Please contact the author with questions and/or clarifications.

## 2017-12-03 NOTE — Progress Notes (Signed)
Pt arrived to room 4e13 from 2h. Pt oriented to room and staff. Vitals obtained. Telemetry box applied and CCMD notified x2. Pt's family at bedside. Chest tube placed to wall suction. Will continue current plan of care.   Ara Kussmaul BSN, RN

## 2017-12-04 ENCOUNTER — Inpatient Hospital Stay (HOSPITAL_COMMUNITY): Payer: Medicare Other

## 2017-12-04 LAB — CBC
HCT: 32.2 % — ABNORMAL LOW (ref 39.0–52.0)
Hemoglobin: 10.4 g/dL — ABNORMAL LOW (ref 13.0–17.0)
MCH: 30.3 pg (ref 26.0–34.0)
MCHC: 32.3 g/dL (ref 30.0–36.0)
MCV: 93.9 fL (ref 78.0–100.0)
Platelets: 450 10*3/uL — ABNORMAL HIGH (ref 150–400)
RBC: 3.43 MIL/uL — ABNORMAL LOW (ref 4.22–5.81)
RDW: 18.2 % — ABNORMAL HIGH (ref 11.5–15.5)
WBC: 35.5 10*3/uL — ABNORMAL HIGH (ref 4.0–10.5)

## 2017-12-04 LAB — AEROBIC/ANAEROBIC CULTURE W GRAM STAIN (SURGICAL/DEEP WOUND)
Culture: NO GROWTH
Culture: NO GROWTH

## 2017-12-04 LAB — COMPREHENSIVE METABOLIC PANEL
ALT: 24 U/L (ref 17–63)
AST: 37 U/L (ref 15–41)
Albumin: 2.5 g/dL — ABNORMAL LOW (ref 3.5–5.0)
Alkaline Phosphatase: 65 U/L (ref 38–126)
Anion gap: 7 (ref 5–15)
BUN: 17 mg/dL (ref 6–20)
CO2: 31 mmol/L (ref 22–32)
Calcium: 8.6 mg/dL — ABNORMAL LOW (ref 8.9–10.3)
Chloride: 98 mmol/L — ABNORMAL LOW (ref 101–111)
Creatinine, Ser: 0.68 mg/dL (ref 0.61–1.24)
GFR calc Af Amer: >60 mL/min (ref >60)
GFR calc non Af Amer: >60 mL/min (ref >60)
Glucose, Bld: 196 mg/dL — ABNORMAL HIGH (ref 65–99)
Potassium: 4 mmol/L (ref 3.5–5.1)
Sodium: 136 mmol/L (ref 135–145)
Total Bilirubin: 0.6 mg/dL (ref 0.3–1.2)
Total Protein: 5.1 g/dL — ABNORMAL LOW (ref 6.5–8.1)

## 2017-12-04 LAB — GLUCOSE, CAPILLARY
GLUCOSE-CAPILLARY: 184 mg/dL — AB (ref 65–99)
GLUCOSE-CAPILLARY: 223 mg/dL — AB (ref 65–99)
Glucose-Capillary: 217 mg/dL — ABNORMAL HIGH (ref 65–99)
Glucose-Capillary: 260 mg/dL — ABNORMAL HIGH (ref 65–99)

## 2017-12-04 MED ORDER — PREDNISONE 10 MG PO TABS
10.0000 mg | ORAL_TABLET | Freq: Every day | ORAL | Status: AC
  Administered 2017-12-05: 10 mg via ORAL
  Filled 2017-12-04: qty 1

## 2017-12-04 MED ORDER — INSULIN GLARGINE 100 UNIT/ML ~~LOC~~ SOLN
20.0000 [IU] | Freq: Two times a day (BID) | SUBCUTANEOUS | Status: DC
  Administered 2017-12-04 – 2017-12-05 (×2): 20 [IU] via SUBCUTANEOUS
  Filled 2017-12-04 (×3): qty 0.2

## 2017-12-04 MED ORDER — INSULIN GLARGINE 100 UNIT/ML ~~LOC~~ SOLN
17.0000 [IU] | Freq: Two times a day (BID) | SUBCUTANEOUS | Status: DC
  Filled 2017-12-04: qty 0.17

## 2017-12-04 NOTE — Progress Notes (Signed)
Patient Demographics:    Oscar Baker, is a 84 y.o. male, DOB - Nov 11, 1932, IWL:[id number]  Admit date - 11/20/2017   Admitting Physician Jani Gravel, MD  Outpatient Primary MD for the patient is [name], MD  LOS - 81   Chief Complaint  Patient presents with  . Cough  . Fever        Subjective:    Oscar Baker is without fevers or chills, patient's wife at bedside, ambulating, no new concerns  Assessment  & Plan :    Principal Problem:   HCAP (healthcare-associated pneumonia) Active Problems:   HTN (hypertension)   Hyperlipidemia   Diabetes mellitus type 2, noninsulin dependent (HCC)   LPRD (laryngopharyngeal reflux disease)   Other allergic rhinitis   Anemia   Chronic cough   CLL (chronic lymphocytic leukemia) (HCC)   Pleural effusion   Pneumonia of left lower lobe due to infectious organism ([place])   Mediastinal lymphadenopathy   Hypoxemia   Sepsis ([place])   S/P thoracotomy               Brief Narrative:     84 year-old male with a history of CLL, prostate cancer, sick sinus syndrome, pacemaker, paroxysmal atrial fibrillation, CHF with ejection fraction 45% admitted with fever increasing shortness of breath cough with yellow to green phlegm for 1 week prior to admission. He was found to have left lower lobe pneumonia with associated effusion and is admitted for the treatment of the same.  Thoracentesis was attempted on 11/25/2017 by pulmonology service but had to be aborted due to coughing.  IR did thoracentesis on 11/26/2017 with only 70 cc fluid obtained.  CT surgery consulted , Status post left VATS drainage of empyema and decortication left lower lobe on 11/29/17 due to persistent pleural effusion despite 2 failed attempts at thoracentesis with minimal fluid drained.     Plan:- 1)HCAP/Haemophilus influenza Bacteremia complicated with loculated Lt Pleural Effusion, CT surgery consulted ,  had Lt sided VATS on 11/29/17, developed significant hemothorax post VATS, went back to OR for exploration and drainage of hemothorax on 11/30/17, 32 F chest tube placed, left-sided chest tubes appear to be functioning as expected, continue IV Rocephin (started 11/21/17), pleura/lung/Wound/OR  cultures from 11/29/2017 Neg so far, blood cultures from 11/24/2017 with methicillin-resistant coagulase-negative staph, suspect contaminant, as patient improved significantly on Rocephin, persistent leukocytosis may be secondary to CLL and steroids.  Anterior chest tube removed 12/03/2017 CT surgery plans to remove posterior chest tube on 12/04/2017, overall clinically patient appears much improved, repeat chest x-ray from 12/03/2017 noted, may discontinue Rocephin on 12/05/2017  2)PAFib-Coumadin on hold since 11/27/2017 to allow for VATS (patient developed hemothorax postop--- so Coumadin is still on hold, , continue Coreg 3.125 mg twice daily  3)HFrEF-last known EF about 45 to 50% from January 2019, continue Lasix 20 mg daily, continue Coreg 3.125 mg twice daily  4)CLL--persistent leukocytosis mostly secondary to underlying CLL  5)DM-glycemic control worsened with steroids, Lantus insulin to 20 units twice daily , use Novolog/Humalog Sliding scale insulin with Accu-Cheks/Fingersticks as ordered,    6)Acute Blood Loss Anemia due to large left-sided hemothorax in the setting of VAT procedure, received 3 units of packed cells on 11/29/2017, received 4 units of FFP on  11/29/2017, also received 2 units of packed cells on 11/30/2017, received 1 unit of FFP on 11/30/2017, and then received one platelet transfusion on 11/30/2017, posttransfusion hemoglobin is 10.4,  clinically patient looks hemodynamically stable  7)Social/Ethics/Code Status : I have discussed the patient's wishes with the patient's wife [name]  and pt's Brother [name] at bedside -- Pt is a FULL CODE, they Desire/Request  life sustaining measures be used  and that artificial means of life support  be employed including cardioversion, mechanical ventilation, chest compressions, pressors, and CPR as indicated  Disposition Plan  : TBD, patient can be discharged when okay with CT surgery team as they are primary  Consults  :  CT surgery, PCCM/IR  Procedures 11/29/17--- Lt VATS 11/29/17--- right subclavian central line placement 11/30/17--evacuation of Lt hemothorax Anterior chest tube removed 12/03/2017 CT surgery plans to remove posterior chest tube on 12/04/2017  DVT Prophylaxis  : Coumadin on hold to allow for VATs/hemothorax.... Lovenox for DVT prophylaxis for now  Lab Results  Component Value Date   PLT 450 (H) 12/04/2017    Inpatient Medications  Scheduled Meds: . acetaminophen  1,000 mg Oral Q6H   Or  . acetaminophen (TYLENOL) oral liquid 160 mg/5 mL  1,000 mg Oral Q6H  . atorvastatin  20 mg Oral q1800  . bisacodyl  10 mg Oral Daily  . carvedilol  3.125 mg Oral BID WC  . Chlorhexidine Gluconate Cloth  6 each Topical Daily  . clonazePAM  0.5 mg Oral QHS  . enoxaparin (LOVENOX) injection  30 mg Subcutaneous Q24H  . feeding supplement (ENSURE ENLIVE)  237 mL Oral TID WC  . furosemide  40 mg Oral Daily  . guaiFENesin  600 mg Oral BID  . insulin aspart  0-15 Units Subcutaneous TID WC  . insulin aspart  0-5 Units Subcutaneous QHS  . insulin glargine  20 Units Subcutaneous BID  . metoCLOPramide (REGLAN) injection  10 mg Intravenous Q6H  . [START ON 12/05/2017] predniSONE  10 mg Oral Q breakfast  . senna-docusate  2 tablet Oral QHS  . sodium chloride flush  10-40 mL Intracatheter Q12H   Continuous Infusions: . cefTRIAXone (ROCEPHIN)  IV Stopped (12/03/17 1912)  . potassium chloride     PRN Meds:.levalbuterol, ondansetron (ZOFRAN) IV, oxyCODONE, potassium chloride, sodium chloride flush, traMADol    Anti-infectives (From admission, onward)   Start     Dose/Rate Route Frequency Ordered Stop   11/30/17 1830  cefTRIAXone  (ROCEPHIN) 2 g in sodium chloride 0.9 % 100 mL IVPB     2 g 200 mL/hr over 30 Minutes Intravenous Every 24 hours 11/30/17 1811     11/29/17 2200  vancomycin (VANCOCIN) 500 mg in sodium chloride 0.9 % 100 mL IVPB  Status:  Discontinued     500 mg 100 mL/hr over 60 Minutes Intravenous Every 12 hours 11/29/17 2157 11/30/17 1357   11/29/17 2200  piperacillin-tazobactam (ZOSYN) IVPB 3.375 g  Status:  Discontinued     3.375 g 12.5 mL/hr over 240 Minutes Intravenous Every 8 hours 11/29/17 2157 11/30/17 1357   11/29/17 1300  ceFAZolin (ANCEF) IVPB 2g/100 mL premix     2 g 200 mL/hr over 30 Minutes Intravenous 30 min pre-op 11/28/17 1215 11/29/17 1722   11/21/17 1600  cefTRIAXone (ROCEPHIN) 2 g in sodium chloride 0.9 % 100 mL IVPB  Status:  Discontinued     2 g 200 mL/hr over 30 Minutes Intravenous Every 24 hours 11/21/17 1553 11/29/17 2135  11/21/17 0600  vancomycin (VANCOCIN) IVPB 750 mg/150 ml premix  Status:  Discontinued     750 mg 150 mL/hr over 60 Minutes Intravenous Every 12 hours 11/20/17 1703 11/21/17 1553   11/21/17 0230  ceFEPIme (MAXIPIME) 1 g in sodium chloride 0.9 % 100 mL IVPB  Status:  Discontinued     1 g 200 mL/hr over 30 Minutes Intravenous Every 8 hours 11/20/17 1835 11/21/17 1553   11/20/17 1930  azithromycin (ZITHROMAX) 500 mg in sodium chloride 0.9 % 250 mL IVPB  Status:  Discontinued     500 mg 250 mL/hr over 60 Minutes Intravenous Every 24 hours 11/20/17 1926 11/21/17 1553   11/20/17 1715  vancomycin (VANCOCIN) 2,000 mg in sodium chloride 0.9 % 500 mL IVPB     2,000 mg 250 mL/hr over 120 Minutes Intravenous  Once 11/20/17 1703 11/20/17 2055   11/20/17 1700  ceFEPIme (MAXIPIME) 1 g in sodium chloride 0.9 % 100 mL IVPB     1 g 200 mL/hr over 30 Minutes Intravenous  Once 11/20/17 1657 11/20/17 1854        Objective:   Vitals:   12/03/17 1732 12/03/17 2045 12/04/17 0416 12/04/17 0900  BP: 127/79  123/75 119/65  Pulse: 70   60  Resp:   18 (!) 26  Temp:   97.6 F  (36.4 C) 97.8 F (36.6 C)  TempSrc:   Oral Oral  SpO2:  99% 93% 95%  Weight:      Height:        Wt Readings from Last 3 Encounters:  12/03/17 92.8 kg (204 lb 9.4 oz)  11/09/17 97.2 kg (214 lb 3.2 oz)  11/04/17 96.4 kg (212 lb 8 oz)     Intake/Output Summary (Last 24 hours) at 12/04/2017 1541 Last data filed at 12/04/2017 1307 Gross per 24 hour  Intake 700 ml  Output 721 ml  Net -21 ml     Physical Exam  Gen:-More awake, in no acute distress  HEENT:- [place].AT, No sclera icterus Ears-very very hard of hearing  neck-Supple Neck,No JVD, right subclavian central line Lungs-left-sided chest tubes with much less drainage  CV- S1, S2 normal Abd-  +ve B.Sounds, Abd Soft, No tenderness,    Extremity/Skin:- No  edema,   good pulses Psych-alert and oriented x3 , affect is appropriate  neuro-no new focal deficits, no tremors   Data Review:   Micro Results Recent Results (from the past 240 hour(s))  Gram stain     Status: None   Collection Time: 11/25/17  3:43 PM  Result Value Ref Range Status   Specimen Description FLUID PLEURAL  Final   Special Requests NONE  Final   Gram Stain   Final    ABUNDANT WBC PRESENT,BOTH PMN AND MONONUCLEAR NO ORGANISMS SEEN Performed at [facility], [place] [address] [place], [place] [address]    Report Status 11/25/2017 FINAL  Final  Body fluid culture     Status: None   Collection Time: 11/26/17  1:01 PM  Result Value Ref Range Status   Specimen Description PLEURAL FLUID LEFT  Final   Special Requests NONE  Final   Gram Stain   Final    MODERATE WBC PRESENT,BOTH PMN AND MONONUCLEAR NO ORGANISMS SEEN    Culture   Final    NO GROWTH 3 DAYS Performed at [facility], [place] [address], [place], [place] [address]    Report Status 11/29/2017 FINAL  Final  Surgical pcr screen  Status: None   Collection Time: 11/27/17  2:54 PM  Result Value Ref Range Status   MRSA, PCR NEGATIVE NEGATIVE Final   Staphylococcus aureus NEGATIVE  NEGATIVE Final    Comment: (NOTE) The Xpert SA Assay (FDA approved for NASAL specimens in patients 30 years of age and older), is one component of a comprehensive surveillance program. It is not intended to diagnose infection nor to guide or monitor treatment. Performed at [facility], [place] [address], [place], [place] [address]   Fungus Culture With Stain     Status: None (Preliminary result)   Collection Time: 11/29/17  5:47 PM  Result Value Ref Range Status   Fungus Stain Final report  Final    Comment: (NOTE) Performed At: [facility] [place], [name] [id number] Rush Farmer MD [id number]    Fungus (Mycology) Culture PENDING  Incomplete   Fungal Source FLUID  Final    Comment: LEFT PLEURAL Performed at [facility], [place] [address] [place], [place] [address]   Aerobic/Anaerobic Culture (surgical/deep wound)     Status: None   Collection Time: 11/29/17  5:47 PM  Result Value Ref Range Status   Specimen Description FLUID LEFT PLEURAL  Final   Special Requests NONE  Final   Gram Stain   Final    RARE WBC PRESENT, PREDOMINANTLY PMN NO ORGANISMS SEEN    Culture   Final    No growth aerobically or anaerobically. Performed at [facility], [place] [address], [place], [place] [address]    Report Status 12/04/2017 FINAL  Final  Acid Fast Smear (AFB)     Status: None   Collection Time: 11/29/17  5:47 PM  Result Value Ref Range Status   AFB Specimen Processing Concentration  Final   Acid Fast Smear Negative  Final    Comment: (NOTE) Performed At: [facility] [place], [name] [id number] Rush Farmer MD [id number]    Source (AFB) FLUID  Final    Comment: LEFT PLEURAL Performed at [facility], [place] [address] [place], [place] [address]   Fungus Culture Result     Status: None   Collection Time: 11/29/17  5:47 PM  Result Value Ref Range Status   Result 1 Comment  Final    Comment:  (NOTE) KOH/Calcofluor preparation:  no fungus observed. Performed At: [facility] [place], [name] [id number] Rush Farmer MD [id number] Performed at [facility], [place] [address] [place], [place] [address]   Aerobic/Anaerobic Culture (surgical/deep wound)     Status: None   Collection Time: 11/29/17  5:55 PM  Result Value Ref Range Status   Specimen Description TISSUE  Final   Special Requests LEFT PLEURAL PEEL  Final   Gram Stain   Final    MODERATE WBC PRESENT, PREDOMINANTLY PMN NO ORGANISMS SEEN    Culture   Final    No growth aerobically or anaerobically. Performed at [facility], [place] [address], [place], [place] [address]    Report Status 12/04/2017 FINAL  Final    Radiology Reports Dg Chest 1 View  Result Date: 11/26/2017 CLINICAL DATA:  Status post thoracentesis. EXAM: CHEST  1 VIEW COMPARISON:  Yesterday. FINDINGS: Significant decrease in amount of left pleural fluid following thoracentesis. No pneumothorax. Stable enlarged cardiac silhouette. Dense airspace opacity at the left lung base, increased. Clear right lung with an overlying skin fold. Bilateral shoulder degenerative changes. IMPRESSION: 1. No  pneumothorax following left thoracentesis. 2. Dense left lower lobe atelectasis or pneumonia, significantly increased. 3. Stable cardiomegaly. Electronically Signed   By: Claudie Revering M.D.   On: 11/26/2017 13:06   Dg Chest 1 View  Result Date: 11/23/2017 CLINICAL DATA:  Hypoxia.  Atrial fibrillation. EXAM: CHEST  1 VIEW COMPARISON:  November 22, 2017 FINDINGS: There is opacity throughout much of the left lung, in part due to pleural effusion. There is also patchy airspace consolidation throughout much of the left lung. There is slight atelectatic change in the right base. The right lung is otherwise clear. Heart is mildly enlarged with pulmonary vascularity within normal limits. Pacemaker lead is attached to the right ventricle. No adenopathy.  There is aortic atherosclerosis. There is degenerative change in each shoulder. IMPRESSION: Opacity throughout much of the left lung likely due to a combination of pleural effusion and airspace consolidation, essentially stable. Slight right base atelectasis. Right lung otherwise clear. Stable cardiomegaly. Stable pacemaker placement. There is aortic atherosclerosis. Aortic Atherosclerosis (ICD10-I70.0). Electronically Signed   By: Lowella Grip III M.D.   On: 11/23/2017 08:17   Dg Chest 1 View  Result Date: 11/22/2017 CLINICAL DATA:  Progressive cough, congestion, and shortness of breath. Fall. EXAM: CHEST  1 VIEW COMPARISON:  Two-view chest x-ray 11/20/2017 FINDINGS: The heart size is exaggerated by low lung volumes. Increased asymmetric left-sided interstitial and airspace disease is noted. A left pleural effusion is present. Right-sided interstitial disease is increasing. IMPRESSION: 1. Increasing interstitial and airspace disease in the left lung consistent with edema and infection. 2. Increasing left pleural effusion. 3. Progressive edema in the right lung. Electronically Signed   By: San Morelle M.D.   On: 11/22/2017 07:34   Dg Chest 2 View  Result Date: 11/20/2017 CLINICAL DATA:  84 year old with chronic cough, presenting with acute worsening, associated with congestion, shortness of breath and fever. EXAM: CHEST - 2 VIEW COMPARISON:  07/21/2017, 07/19/2017, 01/08/2015 and earlier. FINDINGS: AP ERECT and LATERAL images were obtained. LEFT subclavian single lead transvenous pacemaker with the lead tip at the expected location of the RV apex, unchanged. Cardiac silhouette moderately enlarged, unchanged since earlier this year but increased in size since 2016. Thoracic aorta mildly atherosclerotic, unchanged. Hilar and mediastinal contours otherwise unremarkable. Airspace consolidation involving the LEFT lower lobe associated with a LEFT pleural effusion. Pulmonary venous hypertension without  overt edema. Degenerative changes involving the thoracic and UPPER lumbar spine. IMPRESSION: 1. Acute LEFT lower lobe pneumonia with an associated LEFT pleural effusion. 2. Stable moderate cardiomegaly. Pulmonary venous hypertension without overt edema. Electronically Signed   By: Evangeline Dakin M.D.   On: 11/20/2017 14:10   Ct Head Wo Contrast  Result Date: 11/20/2017 CLINICAL DATA:  Tripped and fell 3 days ago, hitting the head. Bruising to the right temple. On anticoagulation. Initial encounter. EXAM: CT HEAD WITHOUT CONTRAST TECHNIQUE: Contiguous axial images were obtained from the base of the skull through the vertex without intravenous contrast. COMPARISON:  07/20/2017 FINDINGS: Brain: There is no evidence of acute infarct, intracranial hemorrhage, mass, midline shift, or extra-axial fluid collection. Mild cerebral atrophy is unchanged. Periventricular white matter hypodensities are also unchanged and nonspecific but compatible with mild chronic small vessel ischemic disease. Vascular: Calcified atherosclerosis at the skull base. No hyperdense vessel. Skull: No fracture or focal osseous lesion. Sinuses/Orbits: Improved paranasal sinus aeration. Moderate residual right ethmoid and right maxillary sinus mucosal thickening. Large right mastoid effusion, mildly increased from prior. Resolved right middle ear opacification. At most trace fluid  at the left mastoid tip. Other: Small right frontotemporal scalp hematoma. IMPRESSION: 1. No evidence of acute intracranial abnormality. 2. Small right frontotemporal scalp hematoma. 3. Mild chronic small vessel ischemic disease. 4. Persistent large right mastoid effusion. Resolved right middle ear effusion. 5. Improved paranasal sinus aeration. Electronically Signed   By: Logan Bores M.D.   On: 11/20/2017 15:14   Ct Chest Wo Contrast  Result Date: 11/23/2017 CLINICAL DATA:  Left pleural effusion. EXAM: CT CHEST WITHOUT CONTRAST TECHNIQUE: Multidetector CT imaging of  the chest was performed following the standard protocol without IV contrast. COMPARISON:  Chest x-ray from same day. CT chest dated April 17, 2014. FINDINGS: Cardiovascular: Mild cardiomegaly with unchanged left chest wall pacemaker. No pericardial effusion. Normal caliber thoracic aorta. Coronary, aortic arch, and branch vessel atherosclerotic vascular disease. Mediastinum/Nodes: Multiple prominent bilateral axillary, left supraclavicular, and mediastinal lymph nodes measuring up to 10 mm in short axis are overall similar to prior study. The thyroid gland trachea, and esophagus demonstrate no significant findings. Lungs/Pleura: Moderate to large left pleural effusion. Trace right pleural effusion. Complete collapse of the left lower lobe. Areas of mucous impaction in the right lower lobe with subsegmental atelectasis. Mild diffuse peribronchial thickening. No consolidation or pneumothorax. No suspicious pulmonary nodule. Mild mosaic attenuation throughout the lungs. Upper Abdomen: No acute abnormality. Musculoskeletal: No chest wall abnormality. No acute or significant osseous findings. Degenerative changes of the thoracic spine. IMPRESSION: 1. Moderate to large left pleural effusion with complete collapse of the left lower lobe. 2. Trace right pleural effusion with right lower lobe subsegmental atelectasis. 3. Mild mosaic attenuation throughout the lungs could reflect chronic small airways or small vessel disease. 4. Prominent bilateral axillary, left supraclavicular, and mediastinal lymph nodes measuring up to 10 mm in short axis are overall similar to prior study and likely reflect patient's history of chronic lymphocytic leukemia. 5.  Aortic atherosclerosis (ICD10-I70.0). Electronically Signed   By: Titus Dubin M.D.   On: 11/23/2017 18:00   Dg Chest Port 1 View  Result Date: 12/04/2017 CLINICAL DATA:  Chest tube, left VATS. EXAM: PORTABLE CHEST 1 VIEW COMPARISON:  12/03/2017 and CT chest 11/23/2017.  FINDINGS: Trachea is midline. Right subclavian central line tip projects over the low SVC. Single left chest tube remains in place, terminating at the apex of the left hemithorax. Left-sided pacemaker lead tip is presumably in the right ventricle but projects beyond the inferior margin of the image. Heart size stable. Thoracic aorta is calcified. Mild interstitial prominence in the left lung with mild left basilar airspace opacification. No definite pneumothorax. Pleural thickening along the lower left lateral hemithorax. No definite pleural fluid. Left apical pleural thickening. Right lung is clear. IMPRESSION: 1. Residual left pleural thickening with single left chest tube remaining in place. 2. Mild interstitial prominence in the left lung with mild left basilar airspace opacification, likely representing residual atelectasis. Difficult to exclude pneumonia. Electronically Signed   By: Lorin Picket M.D.   On: 12/04/2017 08:01   Dg Chest Port 1 View  Result Date: 12/03/2017 CLINICAL DATA:  Follow-up chest tube EXAM: PORTABLE CHEST 1 VIEW COMPARISON:  12/02/2017 FINDINGS: Two chest tubes are noted on the left. No pneumothorax is noted. Pacing device is again seen and stable. Cardiac shadow is stable. Right subclavian central line is seen. Right lung remains clear. Improved aeration on the left is noted with some mild retrocardiac atelectasis. IMPRESSION: Improved aeration on the left. Tubes and lines as described. Electronically Signed   By: Elta Guadeloupe  Lukens M.D.   On: 12/03/2017 06:57   Dg Chest Port 1 View  Result Date: 12/02/2017 CLINICAL DATA:  Chest tube EXAM: PORTABLE CHEST 1 VIEW COMPARISON:  12/01/2017 FINDINGS: Left chest tubes remain in place, unchanged. No pneumothorax. Pacer is unchanged as is right central line. Mild cardiomegaly with vascular congestion and bibasilar atelectasis. IMPRESSION: Stable cardiomegaly with vascular congestion and bibasilar atelectasis. No pneumothorax. Electronically  Signed   By: Rolm Baptise M.D.   On: 12/02/2017 07:51   Dg Chest Port 1 View  Result Date: 12/01/2017 CLINICAL DATA:  Status post VATS for empyema 11/29/2017. Chest tube in place. EXAM: PORTABLE CHEST 1 VIEW COMPARISON:  Single-view of the chest 11/30/2017 and 11/29/2017. FINDINGS: Right subclavian central venous catheter and 2 left chest tubes remain in place. Small left pleural effusion and basilar airspace disease are unchanged. No pneumothorax. Subsegmental atelectasis in the right base has improved since yesterday's examination. Heart size is mildly enlarged. Aortic atherosclerosis is noted. IMPRESSION: Negative for pneumothorax with left chest tubes in place. No change in a small left effusion and basilar airspace disease. Improved right basilar subsegmental atelectasis. Electronically Signed   By: Inge Rise M.D.   On: 12/01/2017 09:36   Dg Chest Port 1 View  Result Date: 11/30/2017 CLINICAL DATA:  Status post thoracotomy. History of leukemia, atrial fibrillation. EXAM: PORTABLE CHEST 1 VIEW COMPARISON:  Chest radiograph November 30, 2017 FINDINGS: Stable cardiomegaly. Two LEFT apical chest tubes in place. LEFT apical pleural thickening. No pneumothorax. RIGHT subclavian central venous catheter distal tip projects in distal superior vena cava. Bibasilar strandy densities. Single lead LEFT cardiac pacemaker. Similar LEFT chest wall subcutaneous gas. IMPRESSION: Stable life-support lines including 2 LEFT apical chest tubes. LEFT apical pleural thickening and bibasilar atelectasis. No pneumothorax. Stable cardiomegaly. Electronically Signed   By: Elon Alas M.D.   On: 11/30/2017 14:25   Dg Chest Port 1 View  Result Date: 11/30/2017 CLINICAL DATA:  Status post VATS on the left EXAM: PORTABLE CHEST 1 VIEW COMPARISON:  11/30/2017 FINDINGS: Pacemaker is again identified and stable. Right central venous line is noted and stable. Two chest tubes are seen on the left. Significant improved aeration  in the left is noted when compared with the prior exam. Degenerative changes of the thoracic spine are noted. IMPRESSION: Significant improved aeration on the left. Electronically Signed   By: Inez Catalina M.D.   On: 11/30/2017 13:50   Dg Chest Port 1 View  Result Date: 11/30/2017 CLINICAL DATA:  Central line placement. EXAM: PORTABLE CHEST 1 VIEW COMPARISON:  Radiograph earlier this day at 0014 hour FINDINGS: Right subclavian central line with tip in the distal SVC. No right pneumothorax. Left chest tube is unchanged in position with tip directed towards the apex. Slight decreased density of the suspected extrapleural left lung opacity. Decreased conspicuity of the pneumothorax versus pneumomediastinum adjacent to the left heart border. Linear atelectasis in the right midlung versus fluid in the fissure. IMPRESSION: 1. Right subclavian central line with tip in the distal SVC. No right pneumothorax. 2. Slightly decreased density of the presumed extrapleural opacity in the left hemithorax. Left chest tubes remain in place. Decreasing conspicuity of the pneumomediastinum versus medial pneumothorax adjacent to the left heart border. Electronically Signed   By: Jeb Levering M.D.   On: 11/30/2017 04:28   Dg Chest Port 1 View  Result Date: 11/30/2017 CLINICAL DATA:  Thoracotomy scar left chest. EXAM: PORTABLE CHEST 1 VIEW COMPARISON:  Radiograph yesterday. FINDINGS: Significant change from prior  exam with near complete opacification of the left hemithorax, opacity primarily laterally and superiorly with lobular borders, suggesting extrapleural process. Two left chest tubes are in place. Pneumomediastinum versus medial pneumothorax along the left heart border. Left-sided pacemaker in place. Mild cardiomegaly is similar. Right lung is grossly clear. IMPRESSION: 1. Significant change from prior exam with near complete opacification of left hemithorax, opacity primarily peripheral and superiorly with lobular  borders suggesting extrapleural. Given recent thoracoscopy, hemothorax is primary concern. 2. Pneumomediastinum versus pneumothorax along the left heart border. These results will be called to the ordering clinician or representative by the Radiologist Assistant, and communication documented in the PACS or zVision Dashboard. Electronically Signed   By: Jeb Levering M.D.   On: 11/30/2017 01:14   Dg Chest Port 1 View  Result Date: 11/29/2017 CLINICAL DATA:  Postop VATS.  Empyema. EXAM: PORTABLE CHEST 1 VIEW COMPARISON:  Chest radiograph 11/29/2017 FINDINGS: Left-sided chest tube and drain tips are at the left lung apex. There is bibasilar atelectasis without focal consolidation. No pneumothorax or sizable pleural effusion. Unchanged cardiomegaly. IMPRESSION: No pneumothorax. Electronically Signed   By: Ulyses Jarred M.D.   On: 11/29/2017 20:20   Dg Chest Port 1 View  Result Date: 11/29/2017 CLINICAL DATA:  Shortness of Breath EXAM: PORTABLE CHEST 1 VIEW COMPARISON:  11/27/2017 FINDINGS: Cardiac shadow is stable. Pacing device is again seen. Right lung is clear. Small left pleural effusion is noted. There is a rounded area of density noted laterally likely related to a loculated component. No bony abnormality is seen. IMPRESSION: Stable appearing left pleural effusion with some degree of loculation. Electronically Signed   By: Inez Catalina M.D.   On: 11/29/2017 08:59   Dg Chest Port 1 View  Result Date: 11/27/2017 CLINICAL DATA:  Pneumonia EXAM: PORTABLE CHEST 1 VIEW COMPARISON:  November 26, 2017 FINDINGS: There is layering pleural effusion on the left. There is consolidation in the left base. There is mild right base atelectasis. Right lung otherwise is clear. Heart is mildly enlarged with pulmonary vascularity normal. Pacemaker lead attached to right ventricle. No pneumothorax. No adenopathy. No bone lesions. IMPRESSION: Layering pleural effusion on the left with persistent consolidation left base. Mild  right base atelectasis. Stable cardiac prominence. Electronically Signed   By: Lowella Grip III M.D.   On: 11/27/2017 09:27   Dg Chest Port 1 View  Result Date: 11/25/2017 CLINICAL DATA:  84 year old male with shortness of breath and pleural effusion. EXAM: PORTABLE CHEST 1 VIEW COMPARISON:  11/23/2017 CT, chest radiograph and prior studies FINDINGS: Cardiomediastinal silhouette is unchanged. A LEFT pacemaker again noted. LEFT pleural effusion and LEFT LOWER lung consolidation/atelectasis again noted. The RIGHT lung is clear. No pneumothorax or acute bony abnormality. IMPRESSION: Little significant change in appearance of the chest with continued LEFT pleural effusion and LEFT LOWER lung atelectasis/consolidation. Electronically Signed   By: Margarette Canada M.D.   On: 11/25/2017 16:53   Dg Swallowing Func-speech Pathology  Result Date: 11/23/2017 Objective Swallowing Evaluation: Type of Study: MBS-Modified Barium Swallow Study  Patient Details Name: SHAIL URBAS MRN: [id number] Date of Birth: [birth year] Today's Date: 11/23/2017 Time: SLP Start Time (ACUTE ONLY): 3532 -SLP Stop Time (ACUTE ONLY): 1637 SLP Time Calculation (min) (ACUTE ONLY): 53 min Past Medical History: Past Medical History: Diagnosis Date . Asthma 1950's  history of . Atrial fibrillation ([place])  . Bilateral carpal tunnel syndrome  . Bilateral lower extremity edema  . Bladder tumor  . Chronic systolic heart failure ([place])  Echo 1/19: Mild LVH, EF 45-50, inf HK, MAC, severe LAE, severe RAE // Echo 7/15: Mild LVH, mod focal basal sept hypertrophy, EF 55-60, AV peak and mean 16/9, trivial MR, mod LAE, PASP 38 . CLL (chronic lymphocytic leukemia) [facility]) oncologist-  dr Ilene Qua--  dx [phone] ;  Lymphocytosis, CLL - per lov note 05-11-2017 currently under active survillance,  CT 04-17-2014 show very small lymphadenopathy, no indication for treatment . Coronary artery disease   cardiologist-  dr Cathie Olden--  08-18-2017 Intermittant risk nuclear study w/  large area of inferior infartion with no evidence ishcemia  . Deafness in right ear  . Diabetes mellitus type 2, noninsulin dependent ([place])  . Elevated PSA   since prostatectomy but now resolved . Hematuria 04/2017 . History of ear infection   Right . History of MI (myocardial infarction)   per myoview nuclear study 08-18-2017 , unknown when . History of shingles 08/2017  L ear and scalp, possible . Hyperlipidemia  . Hypertension  . Ischemic cardiomyopathy 09/01/2017  Presumed +CAD with Nuclear stress test 08/18/17 - Inferior scar, no ischemia, intermediate risk // med management unless +angina or worse dyspnea . OA (osteoarthritis)  . Pacemaker 02/08/2014  followed by dr g. taylor--  single chamber Biotronik due to SSS . Permanent atrial fibrillation ([place])  . Pneumonia 2019  Left lung . Prostate cancer [facility]) urologist-  dr Diona Fanti  dx 2004--  Gleason 8, PSA 10.45--  11-28-2002  s/p  radical prostatectomy;  recurrent w/ increasing PSA, started ADT treatment . RBBB (right bundle branch block)  . Sick sinus syndrome ([place])   a-Flutter with episodes of bradycardia; S/P Biotronik (serial number [id number]) 02-08-2014 . Urinary incontinence  . Wears hearing aid in right ear   receiver and transmitter Past Surgical History: Past Surgical History: Procedure Laterality Date . APPENDECTOMY   . BACK SURGERY    disk . CARDIAC CATHETERIZATION  09-03-1999  dr Cathie Olden  abnormal cardiolite study:  minor luminal irregularities but no critial coronary artery stenosis . CARDIOVASCULAR STRESS TEST  08-18-2017  dr Cathie Olden  Intermediate risk nuclear study w/ large area inferior infarction, no evidence of ishcemia (consistant w/ prior MI)/  study not gated due to frequent PVCs . CARPAL TUNNEL RELEASE Right 2000 . CARPAL TUNNEL RELEASE Left 11/19/2009 . CATARACT EXTRACTION Right 07/2015 . CATARACT EXTRACTION Left 09/2015 . CYSTOSCOPY N/A 11/04/2017  Procedure: CYSTOSCOPY AND CAUTERIZATION OF BLADDER;  Surgeon: Franchot Gallo, MD;  Location:  [facility];  Service: Urology;  Laterality: N/A; . INSERTION PENILE PROSTHESIS  02-22-2004    dr Mattie Marlin  [facility] . KNEE ARTHROSCOPY Left 07/2010 . PERMANENT PACEMAKER INSERTION N/A 02/08/2014  Procedure: PERMANENT PACEMAKER INSERTION;  Surgeon: Evans Lance, MD;  Location: [facility] CATH LAB;  Service: Cardiovascular;  Laterality: N/A; . RADICAL RETROPUBIC PROSTATECTOMY W/ BILATERAL PELVIC LYMPH NODE DISSECTION  11-28-2002   dr Mattie Marlin  [facility] . TONSILLECTOMY   . TOTAL HIP ARTHROPLASTY Left 05/12/2016  Procedure: LEFT TOTAL HIP ARTHROPLASTY ANTERIOR APPROACH;  Surgeon: Paralee Cancel, MD;  Location: [facility];  Service: Orthopedics;  Laterality: Left; . TOTAL HIP ARTHROPLASTY Right 07-15-2006   dr Alvan Dame  [facility] . TRANSTHORACIC ECHOCARDIOGRAM  07/20/2017  mild LVH, ef 45-50%, hypokinesis of the basal-midinferior myocardium, due to AFib unable to evaluate diastolic function/  severe LAE and RAE/  trivial PR and TR HPI: Pt is an 84 year-old male with a history of CLL, prostate cancer, sick sinus syndrome, pacemaker, paroxysmal atrial fibrillation, CHF with ejection fraction 45% admitted  with LLL PNA. Per consult note from critical care, pt/wife describe cyclic cough for over a year, with coughing sometimes escalating during meals, especially with dry foods.  Subjective: alert, denies swallowing difficulty Assessment / Plan / Recommendation CHL IP CLINICAL IMPRESSIONS 11/23/2017 Clinical Impression Pt has a mild oropharyngeal dysphagia with structural component due to suspected osteophytes most prominent at C3-4, C4-5 that impede full epiglottic deflection and airway closure. What further impacts his ability to protect his airway is his impulsive intake (large boluses and fast rate) and premature spillage. Thin and nectar thick liquids spill into the pharynx, filling the valleculae and spilling onward to the pyriform sinuses before the swallow. Then, as he swallows, the thin liquids spills into the partially open airway,  allowing for trace amounts of penetration and aspiration. Pt had significant baseline coughing that made it very challenging to determine whether coughing was related to aspiration or not. SLP provided Mod cues for attempts at a chin tuck, oral holding, and smaller bolus sizes. Small sips were the most effective at protecting the airway with thin liquids although I suspect if he could have performed the oral hold that this would further increase his safety with swallowing. Unfortunately, pt could not consistently utilize these strategies. No airway compromise occurred with thicker liquids or solids, although pt did have to take intermittent pauses during oral prep with solids to catch his breath. Although pt's dysphagia is mild, considering his impulsivity as well as his recurrent PNA and chronic cough, would favor starting a more conservative diet at this time: Dys 3 diet and nectar thick liquids. SLP will f/u to facilitate hopeful transition back to thin liquids with improved ability to take small sips, contain liquids orally, and increase oral care/use of aspiration precautions.  SLP Visit Diagnosis Dysphagia, oropharyngeal phase (R13.12) Attention and concentration deficit following -- Frontal lobe and executive function deficit following -- Impact on safety and function Mild aspiration risk   CHL IP TREATMENT RECOMMENDATION 11/23/2017 Treatment Recommendations Therapy as outlined in treatment plan below   Prognosis 11/23/2017 Prognosis for Safe Diet Advancement Good Barriers to Reach Goals -- Barriers/Prognosis Comment -- CHL IP DIET RECOMMENDATION 11/23/2017 SLP Diet Recommendations Dysphagia 3 (Mech soft) solids;Nectar thick liquid Liquid Administration via Cup Medication Administration Whole meds with puree Compensations Slow rate;Small sips/bites Postural Changes Seated upright at 90 degrees   CHL IP OTHER RECOMMENDATIONS 11/23/2017 Recommended Consults -- Oral Care Recommendations Oral care BID Other  Recommendations Order thickener from pharmacy;Prohibited food (jello, ice cream, thin soups);Remove water pitcher   CHL IP FOLLOW UP RECOMMENDATIONS 11/23/2017 Follow up Recommendations Outpatient SLP;Home health SLP   CHL IP FREQUENCY AND DURATION 11/23/2017 Speech Therapy Frequency (ACUTE ONLY) min 2x/week Treatment Duration 2 weeks      CHL IP ORAL PHASE 11/23/2017 Oral Phase Impaired Oral - Pudding Teaspoon -- Oral - Pudding Cup -- Oral - Honey Teaspoon -- Oral - Honey Cup -- Oral - Nectar Teaspoon -- Oral - Nectar Cup Decreased bolus cohesion;Premature spillage Oral - Nectar Straw -- Oral - Thin Teaspoon -- Oral - Thin Cup Decreased bolus cohesion;Premature spillage Oral - Thin Straw Decreased bolus cohesion;Premature spillage Oral - Puree Piecemeal swallowing Oral - Mech Soft Delayed oral transit Oral - Regular -- Oral - Multi-Consistency -- Oral - Pill -- Oral Phase - Comment --  CHL IP PHARYNGEAL PHASE 11/23/2017 Pharyngeal Phase Impaired Pharyngeal- Pudding Teaspoon -- Pharyngeal -- Pharyngeal- Pudding Cup -- Pharyngeal -- Pharyngeal- Honey Teaspoon -- Pharyngeal -- Pharyngeal- Honey Cup -- Pharyngeal --  Pharyngeal- Nectar Teaspoon -- Pharyngeal -- Pharyngeal- Nectar Cup Reduced epiglottic inversion;Reduced airway/laryngeal closure;Delayed swallow initiation-pyriform sinuses Pharyngeal -- Pharyngeal- Nectar Straw -- Pharyngeal -- Pharyngeal- Thin Teaspoon -- Pharyngeal -- Pharyngeal- Thin Cup Reduced epiglottic inversion;Reduced airway/laryngeal closure;Delayed swallow initiation-pyriform sinuses;Penetration/Aspiration during swallow Pharyngeal Material enters airway, CONTACTS cords and not ejected out Pharyngeal- Thin Straw Reduced epiglottic inversion;Reduced airway/laryngeal closure;Delayed swallow initiation-pyriform sinuses;Penetration/Aspiration during swallow Pharyngeal Material enters airway, passes BELOW cords without attempt by patient to eject out (silent aspiration) Pharyngeal- Puree Reduced epiglottic  inversion;Reduced airway/laryngeal closure Pharyngeal -- Pharyngeal- Mechanical Soft Reduced epiglottic inversion;Reduced airway/laryngeal closure Pharyngeal -- Pharyngeal- Regular -- Pharyngeal -- Pharyngeal- Multi-consistency -- Pharyngeal -- Pharyngeal- Pill -- Pharyngeal -- Pharyngeal Comment --  CHL IP CERVICAL ESOPHAGEAL PHASE 11/23/2017 Cervical Esophageal Phase WFL Pudding Teaspoon -- Pudding Cup -- Honey Teaspoon -- Honey Cup -- Nectar Teaspoon -- Nectar Cup -- Nectar Straw -- Thin Teaspoon -- Thin Cup -- Thin Straw -- Puree -- Mechanical Soft -- Regular -- Multi-consistency -- Pill -- Cervical Esophageal Comment -- No flowsheet data found. [name] 11/23/2017, 5:04 PM  [name], M.A. CCC-SLP [phone]             Ir Thoracentesis Asp Pleural Space W/img Guide  Result Date: 11/26/2017 INDICATION: Patient with left pleural effusion. Request is made for diagnostic and therapeutic thoracentesis. EXAM: ULTRASOUND GUIDED DIAGNOSTIC AND THERAPEUTIC LEFT THORACENTESIS MEDICATIONS: 10 mL 2% lidocaine COMPLICATIONS: None immediate. PROCEDURE: An ultrasound guided thoracentesis was thoroughly discussed with the patient and questions answered. The benefits, risks, alternatives and complications were also discussed. The patient understands and wishes to proceed with the procedure. Written consent was obtained. Ultrasound was performed to localize and mark an adequate pocket of fluid in the left chest. The area was then prepped and draped in the normal sterile fashion. 2% lidocaine was used for local anesthesia. Under ultrasound guidance a Safe-T-Centesis catheter was introduced. Thoracentesis was performed. The catheter was removed and a dressing applied. FINDINGS: A total of approximately 70 mL of cloudy yellow fluid was removed. Samples were sent to the laboratory as requested by the clinical team. IMPRESSION: Successful ultrasound guided diagnostic and therapeutic left thoracentesis yielding 70  mL of pleural fluid. Read by: Brynda Greathouse PA-C Electronically Signed   By: Markus Daft M.D.   On: 11/26/2017 14:15     CBC Recent Labs  Lab 11/28/17 0257 11/29/17 0211  11/30/17 2242 12/01/17 0430 12/02/17 0419 12/03/17 0440 12/04/17 0558  WBC 47.4* 51.3*   < > 32.5* 30.1* 30.4* 33.5* 35.5*  HGB 10.3* 10.3*   < > 8.8* 8.5* 9.1* 10.1* 10.4*  HCT 31.1* 31.5*   < > 26.3* 25.5* 28.8* 31.7* 32.2*  PLT 671* 715*   < > 353 333 359 426* 450*  MCV 96.9 98.1   < > 89.8 90.7 95.0 94.3 93.9  MCH 32.1 32.1   < > 30.0 30.2 30.0 30.1 30.3  MCHC 33.1 32.7   < > 33.5 33.3 31.6 31.9 32.3  RDW 18.7* 18.9*   < > 18.8* 18.9* 19.1* 18.6* 18.2*  LYMPHSABS 31.8* 34.4*  --   --   --   --   --   --   MONOABS 1.9* 1.0  --   --   --   --   --   --   EOSABS 0.0 0.0  --   --   --   --   --   --   BASOSABS 0.0 0.0  --   --   --   --   --   --    < > =  values in this interval not displayed.    Chemistries  Recent Labs  Lab 11/28/17 1301  12/01/17 0430 12/01/17 2000 12/01/17 2335 12/02/17 0419 12/03/17 0440 12/04/17 0558  NA 135   < > 139 136  --  139 138 136  K 5.3*   < > 3.9 6.2* 4.3 4.2 4.1 4.0  CL 97*   < > 107 102  --  102 103 98*  CO2 28   < > 27 27  --  30 32 31  GLUCOSE 320*   < > 129* 334*  --  181* 185* 196*  BUN 30*   < > 34* 27*  --  _0 CREATININE 1.01   < > 1.21 1.02  --  0.83 0.77 0.68  CALCIUM 9.1   < > 7.7* 7.9*  --  8.1* 8.3* 8.6*  AST 22  --   --   --   --  36 25 37  ALT 20  --   --   --   --  33 29 24  ALKPHOS 77  --   --   --   --  60 65 65  BILITOT 1.0  --   --   --   --  0.7 0.8 0.6   < > = values in this interval not displayed.   ------------------------------------------------------------------------------------------------------------------ No results for input(s): CHOL, HDL, LDLCALC, TRIG, CHOLHDL, LDLDIRECT in the last 72 hours.  Lab Results  Component Value Date   HGBA1C 6.0 (H) 05/05/2016    ------------------------------------------------------------------------------------------------------------------ No results for input(s): TSH, T4TOTAL, T3FREE, THYROIDAB in the last 72 hours.  Invalid input(s): FREET3 ------------------------------------------------------------------------------------------------------------------ No results for input(s): VITAMINB12, FOLATE, FERRITIN, TIBC, IRON, RETICCTPCT in the last 72 hours.  Coagulation profile Recent Labs  Lab 11/28/17 0257 11/29/17 0211 11/30/17 0226 11/30/17 0634  INR 1.38 1.33 2.06 1.40    No results for input(s): DDIMER in the last 72 hours.  Cardiac Enzymes No results for input(s): CKMB, TROPONINI, MYOGLOBIN in the last 168 hours.  Invalid input(s): CK ------------------------------------------------------------------------------------------------------------------    Component Value Date/Time   BNP 717.8 (H) 11/20/2017 1339     Roxan Hockey M.D on 12/04/2017 at 3:41 PM  Between 7am to 7pm - Pager - [phone]  After 7pm go to [email or web address] - password [email or web address]  [facility] -  Office  [phone]   Voice Recognition Viviann Spare dictation system was used to create this note, attempts have been made to correct errors. Please contact the author with questions and/or clarifications.

## 2017-12-04 NOTE — Progress Notes (Signed)
Oscar Baker ambulated with minimal assist and front wheel walker in hall for 700 feet. Upon return to room, he is sitting in chair for dinner and has family in room.

## 2017-12-04 NOTE — Progress Notes (Signed)
L chest tube pulled per protocol and patient tolerate well. He is on bedrest and vitals stable. Wife at bedside and patient is resting.

## 2017-12-04 NOTE — Progress Notes (Addendum)
      [address]       [place] [address]             [phone]      4 Days Post-Op Procedure(s) (LRB): DRAINAGE OF HEMOTHORAX (Left) VIDEO ASSISTED THORACOSCOPY (Left) Subjective: Feels good today  Objective: Vital signs in last 24 hours: Temp:  [97.6 F (36.4 C)-98.5 F (36.9 C)] 97.8 F (36.6 C) (06/15 0900) Pulse Rate:  [60-80] 60 (06/15 0900) Cardiac Rhythm: Ventricular paced (06/15 0700) Resp:  [18-26] 26 (06/15 0900) BP: (109-141)/(55-86) 119/65 (06/15 0900) SpO2:  [91 %-99 %] 95 % (06/15 0900)     Intake/Output from previous day: 06/14 0701 - 06/15 0700 In: 580 [P.O.:480; IV Piggyback:100] Out: 720 [Urine:720] Intake/Output this shift: Total I/O In: 240 [P.O.:240] Out: -   General appearance: alert, cooperative and no distress Heart: regular rate and rhythm, S1, S2 normal, no murmur, click, rub or gallop Lungs: clear to auscultation bilaterally Abdomen: soft, non-tender; bowel sounds normal; no masses,  no organomegaly Extremities: some residual edema in his hands Wound: c/d/i  Lab Results: Recent Labs    12/03/17 0440 12/04/17 0558  WBC 33.5* 35.5*  HGB 10.1* 10.4*  HCT 31.7* 32.2*  PLT 426* 450*   BMET:  Recent Labs    12/03/17 0440 12/04/17 0558  NA 138 136  K 4.1 4.0  CL 103 98*  CO2 32 31  GLUCOSE 185* 196*  BUN 16 17  CREATININE 0.77 0.68  CALCIUM 8.3* 8.6*    PT/INR: No results for input(s): LABPROT, INR in the last 72 hours. ABG    Component Value Date/Time   PHART 7.449 12/01/2017 0744   HCO3 28.1 (H) 12/01/2017 0744   TCO2 29 12/01/2017 0744   ACIDBASEDEF 1.0 11/30/2017 1506   O2SAT 92.0 12/01/2017 0744   CBG (last 3)  Recent Labs    12/03/17 1724 12/03/17 2123 12/04/17 0621  GLUCAP 234* 231* 184*    Assessment/Plan: S/P Procedure(s) (LRB): DRAINAGE OF HEMOTHORAX (Left) VIDEO ASSISTED THORACOSCOPY (Left)  1. CV-NSR in the 60s-70s. BP well controlled. Continue Lipitor. Holding coumadin.    2. Pulm-CXR today stable.Chest tubes put out 47ml/24 hours?-no drainage recorded. Will remove posterior chest tube.  3. Renal-creatinine0.77, decreasing. Electrolytes okay.  4. H and H10.4/32.2this morning. Platelets 450k 5. Endo-blood glucose not controlled. Increased Lantus to 17 units BID. Continue SSI.  On three oral agents at home. On Prednisone taper.  6. ID- WBC 35.5, afebrile. On Rocephin.Transition to oral Keflex on discharge.   Plan: Remove posterior chest tube today. Continue incentive spirometer. Ambulate in the halls. Continue condom cath for now. Blood glucose control while on prednisone.    LOS: 14 days    Elgie Collard 12/04/2017 Patient seen and examined, agree with above Dc CT today  Remo Lipps C. Roxan Hockey, MD [facility] [phone]

## 2017-12-05 ENCOUNTER — Inpatient Hospital Stay (HOSPITAL_COMMUNITY): Payer: Medicare Other

## 2017-12-05 LAB — BASIC METABOLIC PANEL
Anion gap: 6 (ref 5–15)
BUN: 19 mg/dL (ref 6–20)
CALCIUM: 8.6 mg/dL — AB (ref 8.9–10.3)
CO2: 30 mmol/L (ref 22–32)
Chloride: 102 mmol/L (ref 101–111)
Creatinine, Ser: 0.64 mg/dL (ref 0.61–1.24)
GFR calc Af Amer: >60 mL/min (ref >60)
GFR calc non Af Amer: >60 mL/min (ref >60)
GLUCOSE: 150 mg/dL — AB (ref 65–99)
Potassium: 3.8 mmol/L (ref 3.5–5.1)
Sodium: 138 mmol/L (ref 135–145)

## 2017-12-05 LAB — GLUCOSE, CAPILLARY
Glucose-Capillary: 133 mg/dL — ABNORMAL HIGH (ref 65–99)
Glucose-Capillary: 130 mg/dL — ABNORMAL HIGH (ref 65–99)
Glucose-Capillary: 172 mg/dL — ABNORMAL HIGH (ref 65–99)

## 2017-12-05 MED ORDER — GUAIFENESIN ER 600 MG PO TB12: 600.0000 mg | ORAL_TABLET | Freq: Two times a day (BID) | ORAL | 0 refills | Status: AC

## 2017-12-05 MED ORDER — CEPHALEXIN 500 MG PO CAPS: 500.0000 mg | ORAL_CAPSULE | Freq: Three times a day (TID) | ORAL | 0 refills | Status: DC

## 2017-12-05 MED ORDER — OXYCODONE HCL 5 MG PO TABS: 5.0000 mg | ORAL_TABLET | Freq: Four times a day (QID) | ORAL | 0 refills | Status: DC | PRN

## 2017-12-05 MED ORDER — ACETAMINOPHEN 500 MG PO TABS: 1000.0000 mg | ORAL_TABLET | Freq: Four times a day (QID) | ORAL | 0 refills | Status: DC

## 2017-12-05 NOTE — Progress Notes (Addendum)
      [address]       [place] [address]             [phone]      5 Days Post-Op Procedure(s) (LRB): DRAINAGE OF HEMOTHORAX (Left) VIDEO ASSISTED THORACOSCOPY (Left) Subjective: Patient is ready to go home today however his pharmacy isn't open today.   Objective: Vital signs in last 24 hours: Temp:  [97.7 F (36.5 C)-98 F (36.7 C)] 97.7 F (36.5 C) (06/16 0715) Cardiac Rhythm: Ventricular paced (06/16 0724) Resp:  [18-28] 28 (06/16 0715) BP: (108-121)/(53-78) 119/71 (06/16 0715) SpO2:  [95 %-97 %] 97 % (06/16 0715)     Intake/Output from previous day: 06/15 0701 - 06/16 0700 In: 910 [P.O.:880; I.V.:30] Out: 2952 [Urine:2950; Stool:2] Intake/Output this shift: No intake/output data recorded.  General appearance: alert, cooperative and no distress Heart: regular rate and rhythm, S1, S2 normal, no murmur, click, rub or gallop Lungs: clear to auscultation bilaterally Abdomen: soft, non-tender; bowel sounds normal; no masses,  no organomegaly Extremities: extremities normal, atraumatic, no cyanosis or edema Wound: clean and dry  Lab Results: Recent Labs    12/03/17 0440 12/04/17 0558  WBC 33.5* 35.5*  HGB 10.1* 10.4*  HCT 31.7* 32.2*  PLT 426* 450*   BMET:  Recent Labs    12/04/17 0558 12/05/17 0613  NA 136 138  K 4.0 3.8  CL 98* 102  CO2 31 30  GLUCOSE 196* 150*  BUN 17 19  CREATININE 0.68 0.64  CALCIUM 8.6* 8.6*    PT/INR: No results for input(s): LABPROT, INR in the last 72 hours. ABG    Component Value Date/Time   PHART 7.449 12/01/2017 0744   HCO3 28.1 (H) 12/01/2017 0744   TCO2 29 12/01/2017 0744   ACIDBASEDEF 1.0 11/30/2017 1506   O2SAT 92.0 12/01/2017 0744   CBG (last 3)  Recent Labs    12/04/17 2130 12/05/17 0629 12/05/17 0820  GLUCAP 260* 133* 130*    Assessment/Plan: S/P Procedure(s) (LRB): DRAINAGE OF HEMOTHORAX (Left) VIDEO ASSISTED THORACOSCOPY (Left)  1. CV-NSR in the70s. BP well controlled.  Continue Lipitor.Holding coumadin. 2. Pulm-CXR today stable. No pneumothorax.  3. Renal-creatinine0.64, decreasing. Electrolytes okay.  4. H and H10.4/32.2this morning. Platelets450k 5. Endo-blood glucosenot controlled. Increased Lantus to 17 units BID. Continue SSI.On three oral agents at home. Last dose of prednisone today. 6. ID- WBC 35.5, afebrile. On Rocephin.Transition to oral Keflex on discharge.   Plan: Home today on PO keflex and coumadin. Start back on Metformin but hold other two medications for now until he follows up with PCP.    LOS: 15 days    Elgie Collard 12/05/2017 Patient seen and examined, agree with above  Remo Lipps C. Roxan Hockey, MD [facility] [phone]

## 2017-12-05 NOTE — Care Management Note (Signed)
Case Management Note  Patient Details  Name: JIGAR ZIELKE MRN: [id number] Date of Birth: 06-21-1933  Subjective/Objective:       Pt from home with wife for CAP.  After further discussion regarding OP PT, it is recommended that patient do [facility] until stronger, then resume OP PT services as needed.  Pt has used [facility] services in the past and wants to use [facility] again.             Action/Plan: Referral called to [facility] with [facility].    Expected Discharge Date:  12/05/17               Expected Discharge Plan:  [place]  In-House Referral:     Discharge planning Services  CM Consult  Post Acute Care Choice:  Home Health Choice offered to:  Patient  DME Arranged:  N/A DME Agency:  NA  HH Arranged:  RN, PT, OT, Speech Therapy [place] Agency:  [place]  Status of Service:  Completed, signed off  If discussed at [place] of Stay Meetings, dates discussed:    Additional Comments:  Claudie Leach, RN 12/05/2017, 12:39 PM

## 2017-12-05 NOTE — Plan of Care (Signed)

## 2017-12-05 NOTE — Progress Notes (Signed)
Discharge instructions reviewed and prescriptions given to patient and wife. No further questions. Patient left unit via wheelchair with nurse tech in stable condition.

## 2017-12-05 NOTE — Progress Notes (Signed)
Patient Demographics:    Oscar Baker, is a 84 y.o. male, DOB - 1932-08-01, CBJ:[id number]  Admit date - 11/04/2017   Admitting Physician Franchot Gallo, MD  Outpatient Primary MD for the patient is [name], MD  LOS - 0   No chief complaint on file.       Subjective:    Oscar Baker is without fevers or chills, patient's wife at bedside, ambulating, no new concerns, patient has been discharged by CT surgery the primary team  Assessment  & Plan :    Active Problems:   * No active hospital problems. *               Brief Narrative:     84 year-old male with a history of CLL, prostate cancer, sick sinus syndrome, pacemaker, paroxysmal atrial fibrillation, CHF with ejection fraction 45% admitted with fever increasing shortness of breath cough with yellow to green phlegm for 1 week prior to admission. He was found to have left lower lobe pneumonia with associated effusion and is admitted for the treatment of the same.  Thoracentesis was attempted on 11/25/2017 by pulmonology service but had to be aborted due to coughing.  IR did thoracentesis on 11/26/2017 with only 70 cc fluid obtained.  CT surgery consulted , Status post left VATS drainage of empyema and decortication left lower lobe on 11/29/17 due to persistent pleural effusion despite 2 failed attempts at thoracentesis with minimal fluid drained.      Plan:- 1)HCAP/Haemophilus influenza Bacteremia complicated with loculated Lt Pleural Effusion, CT surgery consulted , had Lt sided VATS on 11/29/17, developed significant hemothorax post VATS, went back to OR for exploration and drainage of hemothorax on 11/30/17, 32 F chest tube placed, left-sided chest tubes appear to be functioning as expected, continue IV Rocephin (started 11/21/17), pleura/lung/Wound/OR  cultures from 11/29/2017 Neg so far, blood cultures from 11/24/2017 with methicillin-resistant  coagulase-negative staph, suspect contaminant, as patient improved significantly on Rocephin, persistent leukocytosis may be secondary to CLL and steroids.  Anterior chest tube removed 12/03/2017 CT surgery plans to remove posterior chest tube on 12/04/2017, overall clinically patient appears much improved, repeat chest x-ray from 12/03/2017 noted, may discontinue Rocephin on 12/05/2017, CT surgeon advised Keflex  2)PAFib- Coumadin on hold since 11/27/2017 to allow for VATS (patient developed hemothorax postop--- so Coumadin restarted as per CT surgeon, , continue Coreg 3.125 mg twice daily  3)HFrEF-last known EF about 45 to 50% from January 2019, continue Lasix 20 mg daily, continue Coreg 3.125 mg twice daily  4)CLL--persistent leukocytosis mostly secondary to underlying CLL  5)DM-recommend insulin regimen, discharge med rec was done by CT surgeon  6)Acute Blood Loss Anemia due to large left-sided hemothorax in the setting of VAT procedure, received 3 units of packed cells on 11/29/2017, received 4 units of FFP on 11/29/2017, also received 2 units of packed cells on 11/30/2017, received 1 unit of FFP on 11/30/2017, and then received one platelet transfusion on 11/30/2017, posttransfusion hemoglobin is 10.4,  clinically patient looks hemodynamically stable  7)Social/Ethics/Code Status : I have discussed the patient's wishes with the patient's wife Ms Yogi Arther  and pt's Brother [name] at bedside -- Pt is a FULL CODE, they Desire/Request  life sustaining measures be used  and that artificial means of life support  be employed including cardioversion, mechanical ventilation, chest compressions, pressors, and CPR as indicated  Disposition Plan  : TBD, patient can be discharged when okay with CT surgery team as they are primary,  patient has been discharged by CT surgery the primary team   Consults  :  CT surgery, PCCM/IR  Procedures 11/29/17--- Lt VATS 11/29/17--- right subclavian central line  placement 11/30/17--evacuation of Lt hemothorax Anterior chest tube removed 12/03/2017 CT surgery plans to remove posterior chest tube on 12/04/2017  DVT Prophylaxis  : ... Lovenox for DVT prophylaxis for now, Coumadin restarted per CT surgeon  Lab Results  Component Value Date   PLT 450 (H) 12/04/2017    Inpatient Medications  Scheduled Meds:  Continuous Infusions:  PRN Meds:.    Anti-infectives (From admission, onward)   Start     Dose/Rate Route Frequency Ordered Stop   11/04/17 1004  ceFAZolin (ANCEF) IVPB 2g/100 mL premix     2 g 200 mL/hr over 30 Minutes Intravenous 30 min pre-op 11/04/17 1004 11/04/17 1243        Objective:   Vitals:   11/04/17 1315 11/04/17 1330 11/04/17 1345 11/04/17 1455  BP: 121/75 117/73 118/75 129/72  Pulse: 65 66 66 63  Resp: _0 (!) 22  Temp:    97.8 F (36.6 C)  TempSrc:    Axillary  SpO2: 98% 99% 98% 100%  Weight:      Height:        Wt Readings from Last 3 Encounters:  12/03/17 92.8 kg (204 lb 9.4 oz)  11/09/17 97.2 kg (214 lb 3.2 oz)  11/04/17 96.4 kg (212 lb 8 oz)    No intake or output data in the 24 hours ending 12/05/17 1825   Physical Exam  Gen:-More awake, in no acute distress  HEENT:- [place].AT, No sclera icterus Ears-very very hard of hearing  neck-Supple Neck,No JVD, right subclavian central line Lungs-left-sided chest tubes with much less drainage  CV- S1, S2 normal Abd-  +ve B.Sounds, Abd Soft, No tenderness,    Extremity/Skin:- No  edema,   good pulses Psych-alert and oriented x3 , affect is appropriate  neuro-no new focal deficits, no tremors   Data Review:   Micro Results Recent Results (from the past 240 hour(s))  Body fluid culture     Status: None   Collection Time: 11/26/17  1:01 PM  Result Value Ref Range Status   Specimen Description PLEURAL FLUID LEFT  Final   Special Requests NONE  Final   Gram Stain   Final    MODERATE WBC PRESENT,BOTH PMN AND MONONUCLEAR NO ORGANISMS SEEN     Culture   Final    NO GROWTH 3 DAYS Performed at [facility], [place] [address], [place], [place] [address]    Report Status 11/29/2017 FINAL  Final  Surgical pcr screen     Status: None   Collection Time: 11/27/17  2:54 PM  Result Value Ref Range Status   MRSA, PCR NEGATIVE NEGATIVE Final   Staphylococcus aureus NEGATIVE NEGATIVE Final    Comment: (NOTE) The Xpert SA Assay (FDA approved for NASAL specimens in patients 3 years of age and older), is one component of a comprehensive surveillance program. It is not intended to diagnose infection nor to guide or monitor treatment. Performed at [facility], [place] [address], [place],  [address]   Fungus Culture With Stain     Status: None (Preliminary  result)   Collection Time: 11/29/17  5:47 PM  Result Value Ref Range Status   Fungus Stain Final report  Final    Comment: (NOTE) Performed At: [facility] [place], [name] [id number] Rush Farmer MD [id number]    Fungus (Mycology) Culture PENDING  Incomplete   Fungal Source FLUID  Final    Comment: LEFT PLEURAL Performed at [facility], [place] [address] [place], [place] [address]   Aerobic/Anaerobic Culture (surgical/deep wound)     Status: None   Collection Time: 11/29/17  5:47 PM  Result Value Ref Range Status   Specimen Description FLUID LEFT PLEURAL  Final   Special Requests NONE  Final   Gram Stain   Final    RARE WBC PRESENT, PREDOMINANTLY PMN NO ORGANISMS SEEN    Culture   Final    No growth aerobically or anaerobically. Performed at [facility], [place] [address] [place], [place] [address]    Report Status 12/04/2017 FINAL  Final  Acid Fast Smear (AFB)     Status: None   Collection Time: 11/29/17  5:47 PM  Result Value Ref Range Status   AFB Specimen Processing Concentration  Final   Acid Fast Smear Negative  Final    Comment: (NOTE) Performed At: [facility] [place], [name]  [id number] Rush Farmer MD XH:[id number]    Source (AFB) FLUID  Final    Comment: LEFT PLEURAL Performed at [facility], [place] [address] [place], [place] [address]   Fungus Culture Result     Status: None   Collection Time: 11/29/17  5:47 PM  Result Value Ref Range Status   Result 1 Comment  Final    Comment: (NOTE) KOH/Calcofluor preparation:  no fungus observed. Performed At: [facility] [place], [name] [id number] Rush Farmer MD PO:[id number] Performed at [facility], [place] [address], [place], [place] [address]   Aerobic/Anaerobic Culture (surgical/deep wound)     Status: None   Collection Time: 11/29/17  5:55 PM  Result Value Ref Range Status   Specimen Description TISSUE  Final   Special Requests LEFT PLEURAL PEEL  Final   Gram Stain   Final    MODERATE WBC PRESENT, PREDOMINANTLY PMN NO ORGANISMS SEEN    Culture   Final    No growth aerobically or anaerobically. Performed at [facility], [place] [address] [place], [place] [address]    Report Status 12/04/2017 FINAL  Final    Radiology Reports Dg Chest 1 View  Result Date: 11/26/2017 CLINICAL DATA:  Status post thoracentesis. EXAM: CHEST  1 VIEW COMPARISON:  Yesterday. FINDINGS: Significant decrease in amount of left pleural fluid following thoracentesis. No pneumothorax. Stable enlarged cardiac silhouette. Dense airspace opacity at the left lung base, increased. Clear right lung with an overlying skin fold. Bilateral shoulder degenerative changes. IMPRESSION: 1. No pneumothorax following left thoracentesis. 2. Dense left lower lobe atelectasis or pneumonia, significantly increased. 3. Stable cardiomegaly. Electronically Signed   By: Claudie Revering M.D.   On: 11/26/2017 13:06   Dg Chest 1 View  Result Date: 11/23/2017 CLINICAL DATA:  Hypoxia.  Atrial fibrillation. EXAM: CHEST  1 VIEW COMPARISON:  November 22, 2017 FINDINGS: There is opacity throughout much of the left lung, in part  due to pleural effusion. There is also patchy airspace consolidation throughout much of the left lung. There is slight atelectatic change in the right base. The right lung is  otherwise clear. Heart is mildly enlarged with pulmonary vascularity within normal limits. Pacemaker lead is attached to the right ventricle. No adenopathy. There is aortic atherosclerosis. There is degenerative change in each shoulder. IMPRESSION: Opacity throughout much of the left lung likely due to a combination of pleural effusion and airspace consolidation, essentially stable. Slight right base atelectasis. Right lung otherwise clear. Stable cardiomegaly. Stable pacemaker placement. There is aortic atherosclerosis. Aortic Atherosclerosis (ICD10-I70.0). Electronically Signed   By: Lowella Grip III M.D.   On: 11/23/2017 08:17   Dg Chest 1 View  Result Date: 11/22/2017 CLINICAL DATA:  Progressive cough, congestion, and shortness of breath. Fall. EXAM: CHEST  1 VIEW COMPARISON:  Two-view chest x-ray 11/20/2017 FINDINGS: The heart size is exaggerated by low lung volumes. Increased asymmetric left-sided interstitial and airspace disease is noted. A left pleural effusion is present. Right-sided interstitial disease is increasing. IMPRESSION: 1. Increasing interstitial and airspace disease in the left lung consistent with edema and infection. 2. Increasing left pleural effusion. 3. Progressive edema in the right lung. Electronically Signed   By: San Morelle M.D.   On: 11/22/2017 07:34   Dg Chest 2 View  Result Date: 11/20/2017 CLINICAL DATA:  84 year old with chronic cough, presenting with acute worsening, associated with congestion, shortness of breath and fever. EXAM: CHEST - 2 VIEW COMPARISON:  07/21/2017, 07/19/2017, 01/08/2015 and earlier. FINDINGS: AP ERECT and LATERAL images were obtained. LEFT subclavian single lead transvenous pacemaker with the lead tip at the expected location of the RV apex, unchanged. Cardiac  silhouette moderately enlarged, unchanged since earlier this year but increased in size since 2016. Thoracic aorta mildly atherosclerotic, unchanged. Hilar and mediastinal contours otherwise unremarkable. Airspace consolidation involving the LEFT lower lobe associated with a LEFT pleural effusion. Pulmonary venous hypertension without overt edema. Degenerative changes involving the thoracic and UPPER lumbar spine. IMPRESSION: 1. Acute LEFT lower lobe pneumonia with an associated LEFT pleural effusion. 2. Stable moderate cardiomegaly. Pulmonary venous hypertension without overt edema. Electronically Signed   By: Evangeline Dakin M.D.   On: 11/20/2017 14:10   Ct Head Wo Contrast  Result Date: 11/20/2017 CLINICAL DATA:  Tripped and fell 3 days ago, hitting the head. Bruising to the right temple. On anticoagulation. Initial encounter. EXAM: CT HEAD WITHOUT CONTRAST TECHNIQUE: Contiguous axial images were obtained from the base of the skull through the vertex without intravenous contrast. COMPARISON:  07/20/2017 FINDINGS: Brain: There is no evidence of acute infarct, intracranial hemorrhage, mass, midline shift, or extra-axial fluid collection. Mild cerebral atrophy is unchanged. Periventricular white matter hypodensities are also unchanged and nonspecific but compatible with mild chronic small vessel ischemic disease. Vascular: Calcified atherosclerosis at the skull base. No hyperdense vessel. Skull: No fracture or focal osseous lesion. Sinuses/Orbits: Improved paranasal sinus aeration. Moderate residual right ethmoid and right maxillary sinus mucosal thickening. Large right mastoid effusion, mildly increased from prior. Resolved right middle ear opacification. At most trace fluid at the left mastoid tip. Other: Small right frontotemporal scalp hematoma. IMPRESSION: 1. No evidence of acute intracranial abnormality. 2. Small right frontotemporal scalp hematoma. 3. Mild chronic small vessel ischemic disease. 4.  Persistent large right mastoid effusion. Resolved right middle ear effusion. 5. Improved paranasal sinus aeration. Electronically Signed   By: Logan Bores M.D.   On: 11/20/2017 15:14   Ct Chest Wo Contrast  Result Date: 11/23/2017 CLINICAL DATA:  Left pleural effusion. EXAM: CT CHEST WITHOUT CONTRAST TECHNIQUE: Multidetector CT imaging of the chest was performed following the standard protocol without IV contrast.  COMPARISON:  Chest x-ray from same day. CT chest dated April 17, 2014. FINDINGS: Cardiovascular: Mild cardiomegaly with unchanged left chest wall pacemaker. No pericardial effusion. Normal caliber thoracic aorta. Coronary, aortic arch, and branch vessel atherosclerotic vascular disease. Mediastinum/Nodes: Multiple prominent bilateral axillary, left supraclavicular, and mediastinal lymph nodes measuring up to 10 mm in short axis are overall similar to prior study. The thyroid gland trachea, and esophagus demonstrate no significant findings. Lungs/Pleura: Moderate to large left pleural effusion. Trace right pleural effusion. Complete collapse of the left lower lobe. Areas of mucous impaction in the right lower lobe with subsegmental atelectasis. Mild diffuse peribronchial thickening. No consolidation or pneumothorax. No suspicious pulmonary nodule. Mild mosaic attenuation throughout the lungs. Upper Abdomen: No acute abnormality. Musculoskeletal: No chest wall abnormality. No acute or significant osseous findings. Degenerative changes of the thoracic spine. IMPRESSION: 1. Moderate to large left pleural effusion with complete collapse of the left lower lobe. 2. Trace right pleural effusion with right lower lobe subsegmental atelectasis. 3. Mild mosaic attenuation throughout the lungs could reflect chronic small airways or small vessel disease. 4. Prominent bilateral axillary, left supraclavicular, and mediastinal lymph nodes measuring up to 10 mm in short axis are overall similar to prior study and  likely reflect patient's history of chronic lymphocytic leukemia. 5.  Aortic atherosclerosis (ICD10-I70.0). Electronically Signed   By: Titus Dubin M.D.   On: 11/23/2017 18:00   Dg Chest Port 1 View  Result Date: 12/05/2017 CLINICAL DATA:  Chest tube removal EXAM: PORTABLE CHEST 1 VIEW COMPARISON:  12/04/2017 FINDINGS: Left pacer remains in place, unchanged. Right subclavian central line is unchanged. Interval removal of left chest tube. No pneumothorax. Heart is borderline in size. Areas of atelectasis or scarring in the left lower lung. Right lung clear. No effusions. IMPRESSION: Interval removal of left chest tube.  No pneumothorax. Left base atelectasis or scarring. Electronically Signed   By: Rolm Baptise M.D.   On: 12/05/2017 07:35   Dg Chest Port 1 View  Result Date: 12/04/2017 CLINICAL DATA:  Chest tube, left VATS. EXAM: PORTABLE CHEST 1 VIEW COMPARISON:  12/03/2017 and CT chest 11/23/2017. FINDINGS: Trachea is midline. Right subclavian central line tip projects over the low SVC. Single left chest tube remains in place, terminating at the apex of the left hemithorax. Left-sided pacemaker lead tip is presumably in the right ventricle but projects beyond the inferior margin of the image. Heart size stable. Thoracic aorta is calcified. Mild interstitial prominence in the left lung with mild left basilar airspace opacification. No definite pneumothorax. Pleural thickening along the lower left lateral hemithorax. No definite pleural fluid. Left apical pleural thickening. Right lung is clear. IMPRESSION: 1. Residual left pleural thickening with single left chest tube remaining in place. 2. Mild interstitial prominence in the left lung with mild left basilar airspace opacification, likely representing residual atelectasis. Difficult to exclude pneumonia. Electronically Signed   By: Lorin Picket M.D.   On: 12/04/2017 08:01   Dg Chest Port 1 View  Result Date: 12/03/2017 CLINICAL DATA:  Follow-up  chest tube EXAM: PORTABLE CHEST 1 VIEW COMPARISON:  12/02/2017 FINDINGS: Two chest tubes are noted on the left. No pneumothorax is noted. Pacing device is again seen and stable. Cardiac shadow is stable. Right subclavian central line is seen. Right lung remains clear. Improved aeration on the left is noted with some mild retrocardiac atelectasis. IMPRESSION: Improved aeration on the left. Tubes and lines as described. Electronically Signed   By: Linus Mako.D.  On: 12/03/2017 06:57   Dg Chest Port 1 View  Result Date: 12/02/2017 CLINICAL DATA:  Chest tube EXAM: PORTABLE CHEST 1 VIEW COMPARISON:  12/01/2017 FINDINGS: Left chest tubes remain in place, unchanged. No pneumothorax. Pacer is unchanged as is right central line. Mild cardiomegaly with vascular congestion and bibasilar atelectasis. IMPRESSION: Stable cardiomegaly with vascular congestion and bibasilar atelectasis. No pneumothorax. Electronically Signed   By: Rolm Baptise M.D.   On: 12/02/2017 07:51   Dg Chest Port 1 View  Result Date: 12/01/2017 CLINICAL DATA:  Status post VATS for empyema 11/29/2017. Chest tube in place. EXAM: PORTABLE CHEST 1 VIEW COMPARISON:  Single-view of the chest 11/30/2017 and 11/29/2017. FINDINGS: Right subclavian central venous catheter and 2 left chest tubes remain in place. Small left pleural effusion and basilar airspace disease are unchanged. No pneumothorax. Subsegmental atelectasis in the right base has improved since yesterday's examination. Heart size is mildly enlarged. Aortic atherosclerosis is noted. IMPRESSION: Negative for pneumothorax with left chest tubes in place. No change in a small left effusion and basilar airspace disease. Improved right basilar subsegmental atelectasis. Electronically Signed   By: Inge Rise M.D.   On: 12/01/2017 09:36   Dg Chest Port 1 View  Result Date: 11/30/2017 CLINICAL DATA:  Status post thoracotomy. History of leukemia, atrial fibrillation. EXAM: PORTABLE CHEST 1  VIEW COMPARISON:  Chest radiograph November 30, 2017 FINDINGS: Stable cardiomegaly. Two LEFT apical chest tubes in place. LEFT apical pleural thickening. No pneumothorax. RIGHT subclavian central venous catheter distal tip projects in distal superior vena cava. Bibasilar strandy densities. Single lead LEFT cardiac pacemaker. Similar LEFT chest wall subcutaneous gas. IMPRESSION: Stable life-support lines including 2 LEFT apical chest tubes. LEFT apical pleural thickening and bibasilar atelectasis. No pneumothorax. Stable cardiomegaly. Electronically Signed   By: Elon Alas M.D.   On: 11/30/2017 14:25   Dg Chest Port 1 View  Result Date: 11/30/2017 CLINICAL DATA:  Status post VATS on the left EXAM: PORTABLE CHEST 1 VIEW COMPARISON:  11/30/2017 FINDINGS: Pacemaker is again identified and stable. Right central venous line is noted and stable. Two chest tubes are seen on the left. Significant improved aeration in the left is noted when compared with the prior exam. Degenerative changes of the thoracic spine are noted. IMPRESSION: Significant improved aeration on the left. Electronically Signed   By: Inez Catalina M.D.   On: 11/30/2017 13:50   Dg Chest Port 1 View  Result Date: 11/30/2017 CLINICAL DATA:  Central line placement. EXAM: PORTABLE CHEST 1 VIEW COMPARISON:  Radiograph earlier this day at 0014 hour FINDINGS: Right subclavian central line with tip in the distal SVC. No right pneumothorax. Left chest tube is unchanged in position with tip directed towards the apex. Slight decreased density of the suspected extrapleural left lung opacity. Decreased conspicuity of the pneumothorax versus pneumomediastinum adjacent to the left heart border. Linear atelectasis in the right midlung versus fluid in the fissure. IMPRESSION: 1. Right subclavian central line with tip in the distal SVC. No right pneumothorax. 2. Slightly decreased density of the presumed extrapleural opacity in the left hemithorax. Left chest  tubes remain in place. Decreasing conspicuity of the pneumomediastinum versus medial pneumothorax adjacent to the left heart border. Electronically Signed   By: Jeb Levering M.D.   On: 11/30/2017 04:28   Dg Chest Port 1 View  Result Date: 11/30/2017 CLINICAL DATA:  Thoracotomy scar left chest. EXAM: PORTABLE CHEST 1 VIEW COMPARISON:  Radiograph yesterday. FINDINGS: Significant change from prior exam with near complete  opacification of the left hemithorax, opacity primarily laterally and superiorly with lobular borders, suggesting extrapleural process. Two left chest tubes are in place. Pneumomediastinum versus medial pneumothorax along the left heart border. Left-sided pacemaker in place. Mild cardiomegaly is similar. Right lung is grossly clear. IMPRESSION: 1. Significant change from prior exam with near complete opacification of left hemithorax, opacity primarily peripheral and superiorly with lobular borders suggesting extrapleural. Given recent thoracoscopy, hemothorax is primary concern. 2. Pneumomediastinum versus pneumothorax along the left heart border. These results will be called to the ordering clinician or representative by the Radiologist Assistant, and communication documented in the PACS or zVision Dashboard. Electronically Signed   By: Jeb Levering M.D.   On: 11/30/2017 01:14   Dg Chest Port 1 View  Result Date: 11/29/2017 CLINICAL DATA:  Postop VATS.  Empyema. EXAM: PORTABLE CHEST 1 VIEW COMPARISON:  Chest radiograph 11/29/2017 FINDINGS: Left-sided chest tube and drain tips are at the left lung apex. There is bibasilar atelectasis without focal consolidation. No pneumothorax or sizable pleural effusion. Unchanged cardiomegaly. IMPRESSION: No pneumothorax. Electronically Signed   By: Ulyses Jarred M.D.   On: 11/29/2017 20:20   Dg Chest Port 1 View  Result Date: 11/29/2017 CLINICAL DATA:  Shortness of Breath EXAM: PORTABLE CHEST 1 VIEW COMPARISON:  11/27/2017 FINDINGS: Cardiac  shadow is stable. Pacing device is again seen. Right lung is clear. Small left pleural effusion is noted. There is a rounded area of density noted laterally likely related to a loculated component. No bony abnormality is seen. IMPRESSION: Stable appearing left pleural effusion with some degree of loculation. Electronically Signed   By: Inez Catalina M.D.   On: 11/29/2017 08:59   Dg Chest Port 1 View  Result Date: 11/27/2017 CLINICAL DATA:  Pneumonia EXAM: PORTABLE CHEST 1 VIEW COMPARISON:  November 26, 2017 FINDINGS: There is layering pleural effusion on the left. There is consolidation in the left base. There is mild right base atelectasis. Right lung otherwise is clear. Heart is mildly enlarged with pulmonary vascularity normal. Pacemaker lead attached to right ventricle. No pneumothorax. No adenopathy. No bone lesions. IMPRESSION: Layering pleural effusion on the left with persistent consolidation left base. Mild right base atelectasis. Stable cardiac prominence. Electronically Signed   By: Lowella Grip III M.D.   On: 11/27/2017 09:27   Dg Chest Port 1 View  Result Date: 11/25/2017 CLINICAL DATA:  84 year old male with shortness of breath and pleural effusion. EXAM: PORTABLE CHEST 1 VIEW COMPARISON:  11/23/2017 CT, chest radiograph and prior studies FINDINGS: Cardiomediastinal silhouette is unchanged. A LEFT pacemaker again noted. LEFT pleural effusion and LEFT LOWER lung consolidation/atelectasis again noted. The RIGHT lung is clear. No pneumothorax or acute bony abnormality. IMPRESSION: Little significant change in appearance of the chest with continued LEFT pleural effusion and LEFT LOWER lung atelectasis/consolidation. Electronically Signed   By: Margarette Canada M.D.   On: 11/25/2017 16:53   Dg Swallowing Func-speech Pathology  Result Date: 11/23/2017 Objective Swallowing Evaluation: Type of Study: MBS-Modified Barium Swallow Study  Patient Details Name: IZAYIAH TIBBITTS MRN: [id number] Date of Birth:  January 02, 1933 Today's Date: 11/23/2017 Time: SLP Start Time (ACUTE ONLY): 5102 -SLP Stop Time (ACUTE ONLY): 1637 SLP Time Calculation (min) (ACUTE ONLY): 53 min Past Medical History: Past Medical History: Diagnosis Date . Asthma 1950's  history of . Atrial fibrillation ([place])  . Bilateral carpal tunnel syndrome  . Bilateral lower extremity edema  . Bladder tumor  . Chronic systolic heart failure ([place])   Echo 1/19: Mild LVH,  EF 45-50, inf HK, MAC, severe LAE, severe RAE // Echo 7/15: Mild LVH, mod focal basal sept hypertrophy, EF 55-60, AV peak and mean 16/9, trivial MR, mod LAE, PASP 38 . CLL (chronic lymphocytic leukemia) [facility]) oncologist-  dr Ilene Qua--  dx [phone] ;  Lymphocytosis, CLL - per lov note 05-11-2017 currently under active survillance,  CT 04-17-2014 show very small lymphadenopathy, no indication for treatment . Coronary artery disease   cardiologist-  dr Cathie Olden--  08-18-2017 Intermittant risk nuclear study w/ large area of inferior infartion with no evidence ishcemia  . Deafness in right ear  . Diabetes mellitus type 2, noninsulin dependent ([place])  . Elevated PSA   since prostatectomy but now resolved . Hematuria 04/2017 . History of ear infection   Right . History of MI (myocardial infarction)   per myoview nuclear study 08-18-2017 , unknown when . History of shingles 08/2017  L ear and scalp, possible . Hyperlipidemia  . Hypertension  . Ischemic cardiomyopathy 09/01/2017  Presumed +CAD with Nuclear stress test 08/18/17 - Inferior scar, no ischemia, intermediate risk // med management unless +angina or worse dyspnea . OA (osteoarthritis)  . Pacemaker 02/08/2014  followed by dr g. taylor--  single chamber Biotronik due to SSS . Permanent atrial fibrillation ([place])  . Pneumonia 2019  Left lung . Prostate cancer [facility]) urologist-  dr Diona Fanti  dx 2004--  Gleason 8, PSA 10.45--  11-28-2002  s/p  radical prostatectomy;  recurrent w/ increasing PSA, started ADT treatment . RBBB (right bundle branch block)  . Sick sinus  syndrome ([place])   a-Flutter with episodes of bradycardia; S/P Biotronik (serial number [id number]) 02-08-2014 . Urinary incontinence  . Wears hearing aid in right ear   receiver and transmitter Past Surgical History: Past Surgical History: Procedure Laterality Date . APPENDECTOMY   . BACK SURGERY    disk . CARDIAC CATHETERIZATION  09-03-1999  dr Cathie Olden  abnormal cardiolite study:  minor luminal irregularities but no critial coronary artery stenosis . CARDIOVASCULAR STRESS TEST  08-18-2017  dr Cathie Olden  Intermediate risk nuclear study w/ large area inferior infarction, no evidence of ishcemia (consistant w/ prior MI)/  study not gated due to frequent PVCs . CARPAL TUNNEL RELEASE Right 2000 . CARPAL TUNNEL RELEASE Left 11/19/2009 . CATARACT EXTRACTION Right 07/2015 . CATARACT EXTRACTION Left 09/2015 . CYSTOSCOPY N/A 11/04/2017  Procedure: CYSTOSCOPY AND CAUTERIZATION OF BLADDER;  Surgeon: Franchot Gallo, MD;  Location: [facility];  Service: Urology;  Laterality: N/A; . INSERTION PENILE PROSTHESIS  02-22-2004    dr Mattie Marlin  [facility] . KNEE ARTHROSCOPY Left 07/2010 . PERMANENT PACEMAKER INSERTION N/A 02/08/2014  Procedure: PERMANENT PACEMAKER INSERTION;  Surgeon: Evans Lance, MD;  Location: [facility] CATH LAB;  Service: Cardiovascular;  Laterality: N/A; . RADICAL RETROPUBIC PROSTATECTOMY W/ BILATERAL PELVIC LYMPH NODE DISSECTION  11-28-2002   dr Mattie Marlin  [facility] . TONSILLECTOMY   . TOTAL HIP ARTHROPLASTY Left 05/12/2016  Procedure: LEFT TOTAL HIP ARTHROPLASTY ANTERIOR APPROACH;  Surgeon: Paralee Cancel, MD;  Location: [facility];  Service: Orthopedics;  Laterality: Left; . TOTAL HIP ARTHROPLASTY Right 07-15-2006   dr Alvan Dame  [facility] . TRANSTHORACIC ECHOCARDIOGRAM  07/20/2017  mild LVH, ef 45-50%, hypokinesis of the basal-midinferior myocardium, due to AFib unable to evaluate diastolic function/  severe LAE and RAE/  trivial PR and TR HPI: Pt is an 84 year-old male with a history of CLL, prostate cancer, sick sinus syndrome,  pacemaker, paroxysmal atrial fibrillation, CHF with ejection fraction 45% admitted with LLL PNA. Per  consult note from critical care, pt/wife describe cyclic cough for over a year, with coughing sometimes escalating during meals, especially with dry foods.  Subjective: alert, denies swallowing difficulty Assessment / Plan / Recommendation CHL IP CLINICAL IMPRESSIONS 11/23/2017 Clinical Impression Pt has a mild oropharyngeal dysphagia with structural component due to suspected osteophytes most prominent at C3-4, C4-5 that impede full epiglottic deflection and airway closure. What further impacts his ability to protect his airway is his impulsive intake (large boluses and fast rate) and premature spillage. Thin and nectar thick liquids spill into the pharynx, filling the valleculae and spilling onward to the pyriform sinuses before the swallow. Then, as he swallows, the thin liquids spills into the partially open airway, allowing for trace amounts of penetration and aspiration. Pt had significant baseline coughing that made it very challenging to determine whether coughing was related to aspiration or not. SLP provided Mod cues for attempts at a chin tuck, oral holding, and smaller bolus sizes. Small sips were the most effective at protecting the airway with thin liquids although I suspect if he could have performed the oral hold that this would further increase his safety with swallowing. Unfortunately, pt could not consistently utilize these strategies. No airway compromise occurred with thicker liquids or solids, although pt did have to take intermittent pauses during oral prep with solids to catch his breath. Although pt's dysphagia is mild, considering his impulsivity as well as his recurrent PNA and chronic cough, would favor starting a more conservative diet at this time: Dys 3 diet and nectar thick liquids. SLP will f/u to facilitate hopeful transition back to thin liquids with improved ability to take small  sips, contain liquids orally, and increase oral care/use of aspiration precautions.  SLP Visit Diagnosis Dysphagia, oropharyngeal phase (R13.12) Attention and concentration deficit following -- Frontal lobe and executive function deficit following -- Impact on safety and function Mild aspiration risk   CHL IP TREATMENT RECOMMENDATION 11/23/2017 Treatment Recommendations Therapy as outlined in treatment plan below   Prognosis 11/23/2017 Prognosis for Safe Diet Advancement Good Barriers to Reach Goals -- Barriers/Prognosis Comment -- CHL IP DIET RECOMMENDATION 11/23/2017 SLP Diet Recommendations Dysphagia 3 (Mech soft) solids;Nectar thick liquid Liquid Administration via Cup Medication Administration Whole meds with puree Compensations Slow rate;Small sips/bites Postural Changes Seated upright at 90 degrees   CHL IP OTHER RECOMMENDATIONS 11/23/2017 Recommended Consults -- Oral Care Recommendations Oral care BID Other Recommendations Order thickener from pharmacy;Prohibited food (jello, ice cream, thin soups);Remove water pitcher   CHL IP FOLLOW UP RECOMMENDATIONS 11/23/2017 Follow up Recommendations Outpatient SLP;Home health SLP   CHL IP FREQUENCY AND DURATION 11/23/2017 Speech Therapy Frequency (ACUTE ONLY) min 2x/week Treatment Duration 2 weeks      CHL IP ORAL PHASE 11/23/2017 Oral Phase Impaired Oral - Pudding Teaspoon -- Oral - Pudding Cup -- Oral - Honey Teaspoon -- Oral - Honey Cup -- Oral - Nectar Teaspoon -- Oral - Nectar Cup Decreased bolus cohesion;Premature spillage Oral - Nectar Straw -- Oral - Thin Teaspoon -- Oral - Thin Cup Decreased bolus cohesion;Premature spillage Oral - Thin Straw Decreased bolus cohesion;Premature spillage Oral - Puree Piecemeal swallowing Oral - Mech Soft Delayed oral transit Oral - Regular -- Oral - Multi-Consistency -- Oral - Pill -- Oral Phase - Comment --  CHL IP PHARYNGEAL PHASE 11/23/2017 Pharyngeal Phase Impaired Pharyngeal- Pudding Teaspoon -- Pharyngeal -- Pharyngeal- Pudding Cup --  Pharyngeal -- Pharyngeal- Honey Teaspoon -- Pharyngeal -- Pharyngeal- Honey Cup -- Pharyngeal -- Pharyngeal- Nectar Teaspoon --  Pharyngeal -- Pharyngeal- Nectar Cup Reduced epiglottic inversion;Reduced airway/laryngeal closure;Delayed swallow initiation-pyriform sinuses Pharyngeal -- Pharyngeal- Nectar Straw -- Pharyngeal -- Pharyngeal- Thin Teaspoon -- Pharyngeal -- Pharyngeal- Thin Cup Reduced epiglottic inversion;Reduced airway/laryngeal closure;Delayed swallow initiation-pyriform sinuses;Penetration/Aspiration during swallow Pharyngeal Material enters airway, CONTACTS cords and not ejected out Pharyngeal- Thin Straw Reduced epiglottic inversion;Reduced airway/laryngeal closure;Delayed swallow initiation-pyriform sinuses;Penetration/Aspiration during swallow Pharyngeal Material enters airway, passes BELOW cords without attempt by patient to eject out (silent aspiration) Pharyngeal- Puree Reduced epiglottic inversion;Reduced airway/laryngeal closure Pharyngeal -- Pharyngeal- Mechanical Soft Reduced epiglottic inversion;Reduced airway/laryngeal closure Pharyngeal -- Pharyngeal- Regular -- Pharyngeal -- Pharyngeal- Multi-consistency -- Pharyngeal -- Pharyngeal- Pill -- Pharyngeal -- Pharyngeal Comment --  CHL IP CERVICAL ESOPHAGEAL PHASE 11/23/2017 Cervical Esophageal Phase WFL Pudding Teaspoon -- Pudding Cup -- Honey Teaspoon -- Honey Cup -- Nectar Teaspoon -- Nectar Cup -- Nectar Straw -- Thin Teaspoon -- Thin Cup -- Thin Straw -- Puree -- Mechanical Soft -- Regular -- Multi-consistency -- Pill -- Cervical Esophageal Comment -- No flowsheet data found. [name] 11/23/2017, 5:04 PM  [name], M.A. CCC-SLP [phone]             Ir Thoracentesis Asp Pleural Space W/img Guide  Result Date: 11/26/2017 INDICATION: Patient with left pleural effusion. Request is made for diagnostic and therapeutic thoracentesis. EXAM: ULTRASOUND GUIDED DIAGNOSTIC AND THERAPEUTIC LEFT THORACENTESIS MEDICATIONS: 10 mL 2%  lidocaine COMPLICATIONS: None immediate. PROCEDURE: An ultrasound guided thoracentesis was thoroughly discussed with the patient and questions answered. The benefits, risks, alternatives and complications were also discussed. The patient understands and wishes to proceed with the procedure. Written consent was obtained. Ultrasound was performed to localize and mark an adequate pocket of fluid in the left chest. The area was then prepped and draped in the normal sterile fashion. 2% lidocaine was used for local anesthesia. Under ultrasound guidance a Safe-T-Centesis catheter was introduced. Thoracentesis was performed. The catheter was removed and a dressing applied. FINDINGS: A total of approximately 70 mL of cloudy yellow fluid was removed. Samples were sent to the laboratory as requested by the clinical team. IMPRESSION: Successful ultrasound guided diagnostic and therapeutic left thoracentesis yielding 70 mL of pleural fluid. Read by: Brynda Greathouse PA-C Electronically Signed   By: Markus Daft M.D.   On: 11/26/2017 14:15     CBC Recent Labs  Lab 11/29/17 0211  11/30/17 2242 12/01/17 0430 12/02/17 0419 12/03/17 0440 12/04/17 0558  WBC 51.3*   < > 32.5* 30.1* 30.4* 33.5* 35.5*  HGB 10.3*   < > 8.8* 8.5* 9.1* 10.1* 10.4*  HCT 31.5*   < > 26.3* 25.5* 28.8* 31.7* 32.2*  PLT 715*   < > 353 333 359 426* 450*  MCV 98.1   < > 89.8 90.7 95.0 94.3 93.9  MCH 32.1   < > 30.0 30.2 30.0 30.1 30.3  MCHC 32.7   < > 33.5 33.3 31.6 31.9 32.3  RDW 18.9*   < > 18.8* 18.9* 19.1* 18.6* 18.2*  LYMPHSABS 34.4*  --   --   --   --   --   --   MONOABS 1.0  --   --   --   --   --   --   EOSABS 0.0  --   --   --   --   --   --   BASOSABS 0.0  --   --   --   --   --   --    < > = values in  this interval not displayed.    Chemistries  Recent Labs  Lab 12/01/17 2000 12/01/17 2335 12/02/17 0419 12/03/17 0440 12/04/17 0558 12/05/17 0613  NA 136  --  139 138 136 138  K 6.2* 4.3 4.2 4.1 4.0 3.8  CL 102  --  102  103 98* 102  CO2 27  --  30 32 31 30  GLUCOSE 334*  --  181* 185* 196* 150*  BUN 27*  --  _0 CREATININE 1.02  --  0.83 0.77 0.68 0.64  CALCIUM 7.9*  --  8.1* 8.3* 8.6* 8.6*  AST  --   --  36 25 37  --   ALT  --   --  33 29 24  --   ALKPHOS  --   --  60 65 65  --   BILITOT  --   --  0.7 0.8 0.6  --    ------------------------------------------------------------------------------------------------------------------ No results for input(s): CHOL, HDL, LDLCALC, TRIG, CHOLHDL, LDLDIRECT in the last 72 hours.  Lab Results  Component Value Date   HGBA1C 6.0 (H) 05/05/2016   ------------------------------------------------------------------------------------------------------------------ No results for input(s): TSH, T4TOTAL, T3FREE, THYROIDAB in the last 72 hours.  Invalid input(s): FREET3 ------------------------------------------------------------------------------------------------------------------ No results for input(s): VITAMINB12, FOLATE, FERRITIN, TIBC, IRON, RETICCTPCT in the last 72 hours.  Coagulation profile Recent Labs  Lab 11/29/17 0211 11/30/17 0226 11/30/17 0634  INR 1.33 2.06 1.40    No results for input(s): DDIMER in the last 72 hours.  Cardiac Enzymes No results for input(s): CKMB, TROPONINI, MYOGLOBIN in the last 168 hours.  Invalid input(s): CK ------------------------------------------------------------------------------------------------------------------    Component Value Date/Time   BNP 717.8 (H) 11/20/2017 1339    patient has been discharged by CT surgery the primary team    Roxan Hockey M.D on 12/05/2017 at 6:25 PM  Between 7am to 7pm - Pager - [phone]  After 7pm go to [email or web address] - password [email or web address]  [facility] -  Office  [phone]   Voice Recognition Viviann Spare dictation system was used to create this note, attempts have been made to correct errors. Please contact the author with questions and/or  clarifications.

## 2017-12-05 NOTE — Progress Notes (Signed)
L chest tube site with small amount of serosangiounous drainage on dressing changed. 4x4s and taped.

## 2017-12-06 ENCOUNTER — Ambulatory Visit: Payer: Medicare Other | Admitting: Physical Therapy

## 2017-12-07 DIAGNOSIS — H47322 Drusen of optic disc, left eye: Secondary | ICD-10-CM | POA: Diagnosis not present

## 2017-12-07 DIAGNOSIS — H532 Diplopia: Secondary | ICD-10-CM | POA: Diagnosis not present

## 2017-12-07 DIAGNOSIS — H26493 Other secondary cataract, bilateral: Secondary | ICD-10-CM | POA: Diagnosis not present

## 2017-12-08 ENCOUNTER — Ambulatory Visit: Payer: Medicare Other | Admitting: Physical Therapy

## 2017-12-09 DIAGNOSIS — M1990 Unspecified osteoarthritis, unspecified site: Secondary | ICD-10-CM | POA: Diagnosis not present

## 2017-12-09 DIAGNOSIS — R1310 Dysphagia, unspecified: Secondary | ICD-10-CM | POA: Diagnosis not present

## 2017-12-09 DIAGNOSIS — E785 Hyperlipidemia, unspecified: Secondary | ICD-10-CM | POA: Diagnosis not present

## 2017-12-09 DIAGNOSIS — Z7901 Long term (current) use of anticoagulants: Secondary | ICD-10-CM | POA: Diagnosis not present

## 2017-12-09 DIAGNOSIS — I5022 Chronic systolic (congestive) heart failure: Secondary | ICD-10-CM | POA: Diagnosis not present

## 2017-12-09 DIAGNOSIS — Z950 Presence of cardiac pacemaker: Secondary | ICD-10-CM | POA: Diagnosis not present

## 2017-12-09 DIAGNOSIS — C9110 Chronic lymphocytic leukemia of B-cell type not having achieved remission: Secondary | ICD-10-CM | POA: Diagnosis not present

## 2017-12-09 DIAGNOSIS — I110 Hypertensive heart disease with heart failure: Secondary | ICD-10-CM | POA: Diagnosis not present

## 2017-12-09 DIAGNOSIS — D6489 Other specified anemias: Secondary | ICD-10-CM | POA: Diagnosis not present

## 2017-12-09 DIAGNOSIS — Z87891 Personal history of nicotine dependence: Secondary | ICD-10-CM | POA: Diagnosis not present

## 2017-12-09 DIAGNOSIS — J189 Pneumonia, unspecified organism: Secondary | ICD-10-CM | POA: Diagnosis not present

## 2017-12-09 DIAGNOSIS — Z791 Long term (current) use of non-steroidal anti-inflammatories (NSAID): Secondary | ICD-10-CM | POA: Diagnosis not present

## 2017-12-09 DIAGNOSIS — D649 Anemia, unspecified: Secondary | ICD-10-CM | POA: Diagnosis not present

## 2017-12-09 DIAGNOSIS — C61 Malignant neoplasm of prostate: Secondary | ICD-10-CM | POA: Diagnosis not present

## 2017-12-09 DIAGNOSIS — I480 Paroxysmal atrial fibrillation: Secondary | ICD-10-CM | POA: Diagnosis not present

## 2017-12-09 DIAGNOSIS — I2510 Atherosclerotic heart disease of native coronary artery without angina pectoris: Secondary | ICD-10-CM | POA: Diagnosis not present

## 2017-12-09 DIAGNOSIS — R1319 Other dysphagia: Secondary | ICD-10-CM | POA: Diagnosis not present

## 2017-12-09 DIAGNOSIS — Z48813 Encounter for surgical aftercare following surgery on the respiratory system: Secondary | ICD-10-CM | POA: Diagnosis not present

## 2017-12-09 DIAGNOSIS — I255 Ischemic cardiomyopathy: Secondary | ICD-10-CM | POA: Diagnosis not present

## 2017-12-09 DIAGNOSIS — Z7984 Long term (current) use of oral hypoglycemic drugs: Secondary | ICD-10-CM | POA: Diagnosis not present

## 2017-12-09 DIAGNOSIS — E119 Type 2 diabetes mellitus without complications: Secondary | ICD-10-CM | POA: Diagnosis not present

## 2017-12-09 DIAGNOSIS — Z5181 Encounter for therapeutic drug level monitoring: Secondary | ICD-10-CM | POA: Diagnosis not present

## 2017-12-10 DIAGNOSIS — I255 Ischemic cardiomyopathy: Secondary | ICD-10-CM | POA: Diagnosis not present

## 2017-12-10 DIAGNOSIS — C9110 Chronic lymphocytic leukemia of B-cell type not having achieved remission: Secondary | ICD-10-CM | POA: Diagnosis not present

## 2017-12-10 DIAGNOSIS — J189 Pneumonia, unspecified organism: Secondary | ICD-10-CM | POA: Diagnosis not present

## 2017-12-10 DIAGNOSIS — Z48813 Encounter for surgical aftercare following surgery on the respiratory system: Secondary | ICD-10-CM | POA: Diagnosis not present

## 2017-12-10 DIAGNOSIS — E119 Type 2 diabetes mellitus without complications: Secondary | ICD-10-CM | POA: Diagnosis not present

## 2017-12-10 DIAGNOSIS — I2510 Atherosclerotic heart disease of native coronary artery without angina pectoris: Secondary | ICD-10-CM | POA: Diagnosis not present

## 2017-12-13 ENCOUNTER — Ambulatory Visit: Payer: Medicare Other | Admitting: Physical Therapy

## 2017-12-13 ENCOUNTER — Ambulatory Visit (INDEPENDENT_AMBULATORY_CARE_PROVIDER_SITE_OTHER): Payer: Medicare Other | Admitting: Pharmacist

## 2017-12-13 DIAGNOSIS — Z48813 Encounter for surgical aftercare following surgery on the respiratory system: Secondary | ICD-10-CM | POA: Diagnosis not present

## 2017-12-13 DIAGNOSIS — J189 Pneumonia, unspecified organism: Secondary | ICD-10-CM | POA: Diagnosis not present

## 2017-12-13 DIAGNOSIS — I482 Chronic atrial fibrillation: Secondary | ICD-10-CM

## 2017-12-13 DIAGNOSIS — C9110 Chronic lymphocytic leukemia of B-cell type not having achieved remission: Secondary | ICD-10-CM | POA: Diagnosis not present

## 2017-12-13 DIAGNOSIS — I255 Ischemic cardiomyopathy: Secondary | ICD-10-CM | POA: Diagnosis not present

## 2017-12-13 DIAGNOSIS — I4821 Permanent atrial fibrillation: Secondary | ICD-10-CM

## 2017-12-13 DIAGNOSIS — I2510 Atherosclerotic heart disease of native coronary artery without angina pectoris: Secondary | ICD-10-CM | POA: Diagnosis not present

## 2017-12-13 DIAGNOSIS — E119 Type 2 diabetes mellitus without complications: Secondary | ICD-10-CM | POA: Diagnosis not present

## 2017-12-13 LAB — POCT INR: INR: 2.2 (ref 2.0–3.0)

## 2017-12-14 DIAGNOSIS — J189 Pneumonia, unspecified organism: Secondary | ICD-10-CM | POA: Diagnosis not present

## 2017-12-14 DIAGNOSIS — R1310 Dysphagia, unspecified: Secondary | ICD-10-CM | POA: Diagnosis not present

## 2017-12-14 DIAGNOSIS — C9100 Acute lymphoblastic leukemia not having achieved remission: Secondary | ICD-10-CM | POA: Diagnosis not present

## 2017-12-14 DIAGNOSIS — J869 Pyothorax without fistula: Secondary | ICD-10-CM | POA: Diagnosis not present

## 2017-12-14 DIAGNOSIS — Z6828 Body mass index (BMI) 28.0-28.9, adult: Secondary | ICD-10-CM | POA: Diagnosis not present

## 2017-12-14 DIAGNOSIS — I10 Essential (primary) hypertension: Secondary | ICD-10-CM | POA: Diagnosis not present

## 2017-12-14 DIAGNOSIS — E1122 Type 2 diabetes mellitus with diabetic chronic kidney disease: Secondary | ICD-10-CM | POA: Diagnosis not present

## 2017-12-14 DIAGNOSIS — I4892 Unspecified atrial flutter: Secondary | ICD-10-CM | POA: Diagnosis not present

## 2017-12-14 DIAGNOSIS — I5022 Chronic systolic (congestive) heart failure: Secondary | ICD-10-CM | POA: Diagnosis not present

## 2017-12-15 ENCOUNTER — Ambulatory Visit: Payer: Medicare Other | Admitting: Physical Therapy

## 2017-12-15 DIAGNOSIS — I255 Ischemic cardiomyopathy: Secondary | ICD-10-CM | POA: Diagnosis not present

## 2017-12-15 DIAGNOSIS — Z48813 Encounter for surgical aftercare following surgery on the respiratory system: Secondary | ICD-10-CM | POA: Diagnosis not present

## 2017-12-15 DIAGNOSIS — I2510 Atherosclerotic heart disease of native coronary artery without angina pectoris: Secondary | ICD-10-CM | POA: Diagnosis not present

## 2017-12-15 DIAGNOSIS — J189 Pneumonia, unspecified organism: Secondary | ICD-10-CM | POA: Diagnosis not present

## 2017-12-15 DIAGNOSIS — E119 Type 2 diabetes mellitus without complications: Secondary | ICD-10-CM | POA: Diagnosis not present

## 2017-12-15 DIAGNOSIS — C9110 Chronic lymphocytic leukemia of B-cell type not having achieved remission: Secondary | ICD-10-CM | POA: Diagnosis not present

## 2017-12-16 ENCOUNTER — Encounter: Payer: Self-pay | Admitting: Podiatry

## 2017-12-16 ENCOUNTER — Ambulatory Visit (INDEPENDENT_AMBULATORY_CARE_PROVIDER_SITE_OTHER): Payer: Medicare Other | Admitting: Podiatry

## 2017-12-16 DIAGNOSIS — M79671 Pain in right foot: Secondary | ICD-10-CM

## 2017-12-16 DIAGNOSIS — I2510 Atherosclerotic heart disease of native coronary artery without angina pectoris: Secondary | ICD-10-CM | POA: Diagnosis not present

## 2017-12-16 DIAGNOSIS — I255 Ischemic cardiomyopathy: Secondary | ICD-10-CM | POA: Diagnosis not present

## 2017-12-16 DIAGNOSIS — E119 Type 2 diabetes mellitus without complications: Secondary | ICD-10-CM | POA: Diagnosis not present

## 2017-12-16 DIAGNOSIS — Z48813 Encounter for surgical aftercare following surgery on the respiratory system: Secondary | ICD-10-CM | POA: Diagnosis not present

## 2017-12-16 DIAGNOSIS — J189 Pneumonia, unspecified organism: Secondary | ICD-10-CM | POA: Diagnosis not present

## 2017-12-16 DIAGNOSIS — B351 Tinea unguium: Secondary | ICD-10-CM | POA: Diagnosis not present

## 2017-12-16 DIAGNOSIS — M79672 Pain in left foot: Secondary | ICD-10-CM | POA: Diagnosis not present

## 2017-12-16 DIAGNOSIS — C9110 Chronic lymphocytic leukemia of B-cell type not having achieved remission: Secondary | ICD-10-CM | POA: Diagnosis not present

## 2017-12-16 NOTE — Patient Instructions (Signed)
Seen for hypertrophic nails. All nails debrided. Return in 3 months or as needed.  

## 2017-12-16 NOTE — Progress Notes (Signed)
Subjective: 84 y.o. year old male patient presents complaining of painful nails. Patient requests toe nails trimmed.  Had lung surgery after been diagnosed with Pneumonia and discharged in 12/05/17.  Objective: Dermatologic: Thick yellow deformed nails x 10. Vascular: Pedal pulses are all palpable. Orthopedic: Contracted lesser digits  Neurologic: All epicritic and tactile sensations grossly intact.  Assessment: Dystrophic mycotic nails x 10. Painful feet.  Treatment: All mycotic nails debrided.  Return in 3 months or as needed.

## 2017-12-17 ENCOUNTER — Other Ambulatory Visit: Payer: Self-pay | Admitting: Cardiothoracic Surgery

## 2017-12-17 DIAGNOSIS — J869 Pyothorax without fistula: Secondary | ICD-10-CM

## 2017-12-17 DIAGNOSIS — C9110 Chronic lymphocytic leukemia of B-cell type not having achieved remission: Secondary | ICD-10-CM | POA: Diagnosis not present

## 2017-12-17 DIAGNOSIS — I2510 Atherosclerotic heart disease of native coronary artery without angina pectoris: Secondary | ICD-10-CM | POA: Diagnosis not present

## 2017-12-17 DIAGNOSIS — E119 Type 2 diabetes mellitus without complications: Secondary | ICD-10-CM | POA: Diagnosis not present

## 2017-12-17 DIAGNOSIS — I255 Ischemic cardiomyopathy: Secondary | ICD-10-CM | POA: Diagnosis not present

## 2017-12-17 DIAGNOSIS — Z48813 Encounter for surgical aftercare following surgery on the respiratory system: Secondary | ICD-10-CM | POA: Diagnosis not present

## 2017-12-17 DIAGNOSIS — J189 Pneumonia, unspecified organism: Secondary | ICD-10-CM | POA: Diagnosis not present

## 2017-12-20 ENCOUNTER — Ambulatory Visit (INDEPENDENT_AMBULATORY_CARE_PROVIDER_SITE_OTHER): Payer: Medicare Other | Admitting: Interventional Cardiology

## 2017-12-20 ENCOUNTER — Ambulatory Visit: Payer: Medicare Other | Admitting: Physical Therapy

## 2017-12-20 DIAGNOSIS — I2510 Atherosclerotic heart disease of native coronary artery without angina pectoris: Secondary | ICD-10-CM | POA: Diagnosis not present

## 2017-12-20 DIAGNOSIS — I255 Ischemic cardiomyopathy: Secondary | ICD-10-CM | POA: Diagnosis not present

## 2017-12-20 DIAGNOSIS — Z5181 Encounter for therapeutic drug level monitoring: Secondary | ICD-10-CM | POA: Diagnosis not present

## 2017-12-20 DIAGNOSIS — I482 Chronic atrial fibrillation: Secondary | ICD-10-CM | POA: Diagnosis not present

## 2017-12-20 DIAGNOSIS — J189 Pneumonia, unspecified organism: Secondary | ICD-10-CM | POA: Diagnosis not present

## 2017-12-20 DIAGNOSIS — Z48813 Encounter for surgical aftercare following surgery on the respiratory system: Secondary | ICD-10-CM | POA: Diagnosis not present

## 2017-12-20 DIAGNOSIS — E119 Type 2 diabetes mellitus without complications: Secondary | ICD-10-CM | POA: Diagnosis not present

## 2017-12-20 DIAGNOSIS — C9110 Chronic lymphocytic leukemia of B-cell type not having achieved remission: Secondary | ICD-10-CM | POA: Diagnosis not present

## 2017-12-20 DIAGNOSIS — I4821 Permanent atrial fibrillation: Secondary | ICD-10-CM

## 2017-12-20 LAB — POCT INR: INR: 3.5 — AB (ref 2.0–3.0)

## 2017-12-21 DIAGNOSIS — E119 Type 2 diabetes mellitus without complications: Secondary | ICD-10-CM | POA: Diagnosis not present

## 2017-12-21 DIAGNOSIS — J189 Pneumonia, unspecified organism: Secondary | ICD-10-CM | POA: Diagnosis not present

## 2017-12-21 DIAGNOSIS — Z48813 Encounter for surgical aftercare following surgery on the respiratory system: Secondary | ICD-10-CM | POA: Diagnosis not present

## 2017-12-21 DIAGNOSIS — C9110 Chronic lymphocytic leukemia of B-cell type not having achieved remission: Secondary | ICD-10-CM | POA: Diagnosis not present

## 2017-12-21 DIAGNOSIS — I255 Ischemic cardiomyopathy: Secondary | ICD-10-CM | POA: Diagnosis not present

## 2017-12-21 DIAGNOSIS — I2510 Atherosclerotic heart disease of native coronary artery without angina pectoris: Secondary | ICD-10-CM | POA: Diagnosis not present

## 2017-12-22 ENCOUNTER — Ambulatory Visit: Payer: Medicare Other | Admitting: Cardiothoracic Surgery

## 2017-12-22 ENCOUNTER — Ambulatory Visit: Payer: Medicare Other | Admitting: Physical Therapy

## 2017-12-22 DIAGNOSIS — I2510 Atherosclerotic heart disease of native coronary artery without angina pectoris: Secondary | ICD-10-CM | POA: Diagnosis not present

## 2017-12-22 DIAGNOSIS — E119 Type 2 diabetes mellitus without complications: Secondary | ICD-10-CM | POA: Diagnosis not present

## 2017-12-22 DIAGNOSIS — Z48813 Encounter for surgical aftercare following surgery on the respiratory system: Secondary | ICD-10-CM | POA: Diagnosis not present

## 2017-12-22 DIAGNOSIS — C9110 Chronic lymphocytic leukemia of B-cell type not having achieved remission: Secondary | ICD-10-CM | POA: Diagnosis not present

## 2017-12-22 DIAGNOSIS — I255 Ischemic cardiomyopathy: Secondary | ICD-10-CM | POA: Diagnosis not present

## 2017-12-22 DIAGNOSIS — J189 Pneumonia, unspecified organism: Secondary | ICD-10-CM | POA: Diagnosis not present

## 2017-12-27 ENCOUNTER — Ambulatory Visit (INDEPENDENT_AMBULATORY_CARE_PROVIDER_SITE_OTHER): Payer: Medicare Other | Admitting: Cardiovascular Disease

## 2017-12-27 ENCOUNTER — Ambulatory Visit: Payer: Medicare Other | Admitting: Physical Therapy

## 2017-12-27 ENCOUNTER — Other Ambulatory Visit: Payer: Self-pay | Admitting: *Deleted

## 2017-12-27 DIAGNOSIS — Z5181 Encounter for therapeutic drug level monitoring: Secondary | ICD-10-CM | POA: Diagnosis not present

## 2017-12-27 DIAGNOSIS — I482 Chronic atrial fibrillation: Secondary | ICD-10-CM | POA: Diagnosis not present

## 2017-12-27 DIAGNOSIS — E119 Type 2 diabetes mellitus without complications: Secondary | ICD-10-CM | POA: Diagnosis not present

## 2017-12-27 DIAGNOSIS — I2510 Atherosclerotic heart disease of native coronary artery without angina pectoris: Secondary | ICD-10-CM | POA: Diagnosis not present

## 2017-12-27 DIAGNOSIS — I4821 Permanent atrial fibrillation: Secondary | ICD-10-CM

## 2017-12-27 DIAGNOSIS — Z48813 Encounter for surgical aftercare following surgery on the respiratory system: Secondary | ICD-10-CM | POA: Diagnosis not present

## 2017-12-27 DIAGNOSIS — I255 Ischemic cardiomyopathy: Secondary | ICD-10-CM | POA: Diagnosis not present

## 2017-12-27 DIAGNOSIS — C9110 Chronic lymphocytic leukemia of B-cell type not having achieved remission: Secondary | ICD-10-CM | POA: Diagnosis not present

## 2017-12-27 DIAGNOSIS — J869 Pyothorax without fistula: Secondary | ICD-10-CM

## 2017-12-27 DIAGNOSIS — J189 Pneumonia, unspecified organism: Secondary | ICD-10-CM | POA: Diagnosis not present

## 2017-12-27 LAB — POCT INR: INR: 3 (ref 2.0–3.0)

## 2017-12-28 ENCOUNTER — Ambulatory Visit (INDEPENDENT_AMBULATORY_CARE_PROVIDER_SITE_OTHER): Payer: Self-pay | Admitting: Physician Assistant

## 2017-12-28 ENCOUNTER — Other Ambulatory Visit: Payer: Self-pay

## 2017-12-28 ENCOUNTER — Ambulatory Visit
Admission: RE | Admit: 2017-12-28 | Discharge: 2017-12-28 | Disposition: A | Discharge status: Home / Self Care | Payer: Medicare Other | Source: Ambulatory Visit | Attending: Cardiothoracic Surgery | Admitting: Cardiothoracic Surgery

## 2017-12-28 VITALS — BP 118/78 | HR 71 | Resp 16 | Ht 70.0 in | Wt 198.0 lb

## 2017-12-28 DIAGNOSIS — J869 Pyothorax without fistula: Secondary | ICD-10-CM

## 2017-12-28 DIAGNOSIS — J90 Pleural effusion, not elsewhere classified: Secondary | ICD-10-CM

## 2017-12-28 DIAGNOSIS — R918 Other nonspecific abnormal finding of lung field: Secondary | ICD-10-CM | POA: Diagnosis not present

## 2017-12-28 DIAGNOSIS — Z9889 Other specified postprocedural states: Secondary | ICD-10-CM

## 2017-12-28 NOTE — Progress Notes (Signed)
  HPI: Patient returns for routine postoperative follow-up having undergone VATS procedure with drainage of empyema on 11/29/2017. The patient's early postoperative recovery while in the hospital was notable for re-exploration for removal of blood clots.  He was treated with IV ABX and resume on home regimen of coumadin prior to discharge.  Since hospital discharge the patient reports he is doing okay.  He states he is still uncomfortable on his left side and it continues to be bruised.  He states he knows we had to have separated his ribs as much pain as he has had.  He has completed his course of antibiotics.  He denies evidence of fevers.  He continues to have a cough with clear sputum but states this has improved since surgery.    Current Outpatient Medications  Medication Sig Dispense Refill  . acetaminophen (TYLENOL) 500 MG tablet Take 2 tablets (1,000 mg total) by mouth every 6 (six) hours. 30 tablet 0  . atorvastatin (LIPITOR) 20 MG tablet TAKE 1 TABLET BY MOUTH DAILY AT 6PM 90 tablet 0  . carvedilol (COREG) 3.125 MG tablet Take 1 tablet (3.125 mg total) by mouth 2 (two) times daily. 180 tablet 3  . fenofibrate micronized (LOFIBRA) 200 MG capsule TAKE 1 CAPSULE BY MOUTH DAILY BEFORE BREAKFAST 90 capsule 3  . furosemide (LASIX) 20 MG tablet Take 1 tablet (20 mg total) by mouth daily as needed. 30 tablet 6  . metFORMIN (GLUCOPHAGE) 1000 MG tablet Take 1,000 mg by mouth 2 (two) times daily with a meal.      . multivitamin (THERAGRAN) per tablet Take 1 tablet by mouth daily. Will stop prior to procedure    . vitamin B-12 (CYANOCOBALAMIN) 1000 MCG tablet Take 1,000 mcg by mouth daily. Will stop prior to procedure    . warfarin (COUMADIN) 5 MG tablet TAKE AS DIRECTED BY COUMADIN CLINIC (Patient taking differently: Take 5-7.5 mg by mouth See admin instructions. Take 7.5mg  on Saturdays and 5mg  on all other days or TAKE AS DIRECTED BY COUMADIN CLINIC) 90 tablet 1   No current facility-administered  medications for this visit.     Physical Exam:  BP 118/78 (BP Location: Left Arm, Patient Position: Sitting, Cuff Size: Large)   Pulse 71   Resp 16   Ht 5\' 10"  (1.778 m)   Wt 198 lb (89.8 kg)   SpO2 98% Comment: ON RA  BMI 28.41 kg/m   Gen: no apparent distress Heart: RRR Lungs: CTA bilaterally Incisions: bruising along left thoracotomy site, chest tubes sutures are healing without evidence of infection  Diagnostic Tests:  CXR: small residual left loculated pleural effusion, no pneumothorax  A/P:  1. S/P VATs for drainage of empyema- there continues to be a small loculated area of empyema/effusion on CXR.  This should resolve with time  2. ID- empyema has completed ABX, denies fever, no purulent sputum present 3. RTC prn  Ellwood Handler, PA-C [facility] [phone]

## 2017-12-28 NOTE — Patient Instructions (Signed)
You may return to driving an automobile as long as you are no longer requiring oral narcotic pain relievers during the daytime.  It would be wise to start driving only short distances during the daylight and gradually increase from there as you feel comfortable.  You may continue to gradually increase your physical activity as tolerated.  Refrain from any heavy lifting or strenuous use of your arms and shoulders until at least 8 weeks from the time of your surgery, and avoid activities that cause increased pain in your chest on the side of your surgical incision.  Otherwise you may continue to increase activities without any particular limitations.  Increase the intensity and duration of physical activity gradually.

## 2017-12-29 ENCOUNTER — Ambulatory Visit: Payer: Medicare Other | Admitting: Physical Therapy

## 2017-12-29 DIAGNOSIS — I2510 Atherosclerotic heart disease of native coronary artery without angina pectoris: Secondary | ICD-10-CM | POA: Diagnosis not present

## 2017-12-29 DIAGNOSIS — C9110 Chronic lymphocytic leukemia of B-cell type not having achieved remission: Secondary | ICD-10-CM | POA: Diagnosis not present

## 2017-12-29 DIAGNOSIS — E119 Type 2 diabetes mellitus without complications: Secondary | ICD-10-CM | POA: Diagnosis not present

## 2017-12-29 DIAGNOSIS — J189 Pneumonia, unspecified organism: Secondary | ICD-10-CM | POA: Diagnosis not present

## 2017-12-29 DIAGNOSIS — I255 Ischemic cardiomyopathy: Secondary | ICD-10-CM | POA: Diagnosis not present

## 2017-12-29 DIAGNOSIS — Z48813 Encounter for surgical aftercare following surgery on the respiratory system: Secondary | ICD-10-CM | POA: Diagnosis not present

## 2017-12-31 ENCOUNTER — Telehealth: Payer: Self-pay

## 2017-12-31 NOTE — Telephone Encounter (Signed)
Verneda Skill Nurse with [facility] [phone], Called in inform Dr Prescott Gum that Mr Grosser refused home visit today 12/30/17, but will resume next Monday 01/03/18

## 2018-01-02 LAB — FUNGUS CULTURE RESULT

## 2018-01-02 LAB — FUNGUS CULTURE WITH STAIN

## 2018-01-02 LAB — FUNGAL ORGANISM REFLEX

## 2018-01-03 ENCOUNTER — Ambulatory Visit: Payer: Medicare Other | Admitting: Physical Therapy

## 2018-01-03 DIAGNOSIS — Z48813 Encounter for surgical aftercare following surgery on the respiratory system: Secondary | ICD-10-CM | POA: Diagnosis not present

## 2018-01-03 DIAGNOSIS — I255 Ischemic cardiomyopathy: Secondary | ICD-10-CM | POA: Diagnosis not present

## 2018-01-03 DIAGNOSIS — J189 Pneumonia, unspecified organism: Secondary | ICD-10-CM | POA: Diagnosis not present

## 2018-01-03 DIAGNOSIS — C9110 Chronic lymphocytic leukemia of B-cell type not having achieved remission: Secondary | ICD-10-CM | POA: Diagnosis not present

## 2018-01-03 DIAGNOSIS — E119 Type 2 diabetes mellitus without complications: Secondary | ICD-10-CM | POA: Diagnosis not present

## 2018-01-03 DIAGNOSIS — I2510 Atherosclerotic heart disease of native coronary artery without angina pectoris: Secondary | ICD-10-CM | POA: Diagnosis not present

## 2018-01-05 ENCOUNTER — Telehealth: Payer: Self-pay | Admitting: Cardiovascular Disease

## 2018-01-05 ENCOUNTER — Ambulatory Visit: Payer: Medicare Other | Admitting: Cardiothoracic Surgery

## 2018-01-05 ENCOUNTER — Ambulatory Visit: Payer: Medicare Other | Admitting: Physical Therapy

## 2018-01-05 DIAGNOSIS — J189 Pneumonia, unspecified organism: Secondary | ICD-10-CM | POA: Diagnosis not present

## 2018-01-05 DIAGNOSIS — I255 Ischemic cardiomyopathy: Secondary | ICD-10-CM | POA: Diagnosis not present

## 2018-01-05 DIAGNOSIS — C9110 Chronic lymphocytic leukemia of B-cell type not having achieved remission: Secondary | ICD-10-CM | POA: Diagnosis not present

## 2018-01-05 DIAGNOSIS — E119 Type 2 diabetes mellitus without complications: Secondary | ICD-10-CM | POA: Diagnosis not present

## 2018-01-05 DIAGNOSIS — Z48813 Encounter for surgical aftercare following surgery on the respiratory system: Secondary | ICD-10-CM | POA: Diagnosis not present

## 2018-01-05 DIAGNOSIS — I2510 Atherosclerotic heart disease of native coronary artery without angina pectoris: Secondary | ICD-10-CM | POA: Diagnosis not present

## 2018-01-05 NOTE — Telephone Encounter (Signed)
NEW MESSAGE    Amber from [facility] calling to report swelling   Pt c/o swelling: STAT is pt has developed SOB within 24 hours  1) How much weight have you gained and in what time span? 5 pounds in 4 days  2) If swelling, where is the swelling located? legs  3) Are you currently taking a fluid pill? yes  4) Are you currently SOB? no  5) Do you have a log of your daily weights (if so, list)?   6) Have you gained 3 pounds in a day or 5 pounds in a week? 5 pounds in 4 days  7) Have you traveled recently? no

## 2018-01-05 NOTE — Telephone Encounter (Signed)
Returned call to [employer] at [facility] who states that the patient has gained 5 lbs in 1 week and is having extra swelling in his lower extremities. She states patient's BP was 124/62 HR 66 SpO2 >95%. She states that the patient is taking lasix but is unsure of how often he is taking it.   Called and spoke to patient who states that he weighed 200.6 on 7/10 and this morning he weighed 205.6. He states that he does have increased swelling in his ankles. He denies SOB or any other symptoms. Patient states that he has an Rx for lasix 20 mg QD PRN but had not needed. He states that he did take 20 mg yesterday and 20 mg this morning. Patient states that he does not add salt to his food but admits to eating pork yesterday. Instructed patient to take an extra 20 mg of lasix today, wear compression stockings and elevate legs to assist with swelling, avoid salt in his diet including foods that are high in sodium (ie. canned foods, processed food, etc.), and to continue to monitor weights daily weighing at the same time everyday with the same amount of clothes. Instructed patient to let us know if his Sx changed or worsened. Patient aware that he will be contacted if there are any further recommendations. Patient verbalized understanding and thanked me for the call.

## 2018-01-06 ENCOUNTER — Ambulatory Visit (INDEPENDENT_AMBULATORY_CARE_PROVIDER_SITE_OTHER): Payer: Medicare Other | Admitting: Pharmacist

## 2018-01-06 DIAGNOSIS — I482 Chronic atrial fibrillation: Secondary | ICD-10-CM

## 2018-01-06 DIAGNOSIS — I4821 Permanent atrial fibrillation: Secondary | ICD-10-CM

## 2018-01-06 DIAGNOSIS — Z5181 Encounter for therapeutic drug level monitoring: Secondary | ICD-10-CM | POA: Diagnosis not present

## 2018-01-06 LAB — POCT INR: INR: 2.6 (ref 2.0–3.0)

## 2018-01-06 NOTE — Telephone Encounter (Signed)
Agree with plan noted by Drue Novel, RN

## 2018-01-10 ENCOUNTER — Ambulatory Visit (INDEPENDENT_AMBULATORY_CARE_PROVIDER_SITE_OTHER): Payer: Medicare Other | Admitting: *Deleted

## 2018-01-10 ENCOUNTER — Ambulatory Visit: Payer: Medicare Other | Admitting: Physical Therapy

## 2018-01-10 DIAGNOSIS — I495 Sick sinus syndrome: Secondary | ICD-10-CM | POA: Diagnosis not present

## 2018-01-10 NOTE — Progress Notes (Signed)
Remote pacemaker transmission.   

## 2018-01-11 ENCOUNTER — Encounter: Payer: Self-pay | Admitting: Cardiology

## 2018-01-12 ENCOUNTER — Telehealth: Payer: Self-pay | Admitting: Internal Medicine

## 2018-01-12 ENCOUNTER — Ambulatory Visit (INDEPENDENT_AMBULATORY_CARE_PROVIDER_SITE_OTHER): Payer: Medicare Other | Admitting: Adult Health

## 2018-01-12 ENCOUNTER — Ambulatory Visit: Payer: Medicare Other | Admitting: Physical Therapy

## 2018-01-12 ENCOUNTER — Ambulatory Visit (INDEPENDENT_AMBULATORY_CARE_PROVIDER_SITE_OTHER)
Admission: RE | Admit: 2018-01-12 | Discharge: 2018-01-12 | Disposition: A | Discharge status: Home / Self Care | Payer: Medicare Other | Source: Ambulatory Visit | Attending: Adult Health | Admitting: Adult Health

## 2018-01-12 ENCOUNTER — Encounter: Payer: Self-pay | Admitting: Adult Health

## 2018-01-12 VITALS — BP 112/72 | HR 59 | Ht 71.0 in | Wt 208.6 lb

## 2018-01-12 DIAGNOSIS — J189 Pneumonia, unspecified organism: Secondary | ICD-10-CM

## 2018-01-12 DIAGNOSIS — J45991 Cough variant asthma: Secondary | ICD-10-CM

## 2018-01-12 DIAGNOSIS — I2510 Atherosclerotic heart disease of native coronary artery without angina pectoris: Secondary | ICD-10-CM | POA: Diagnosis not present

## 2018-01-12 DIAGNOSIS — I255 Ischemic cardiomyopathy: Secondary | ICD-10-CM

## 2018-01-12 DIAGNOSIS — Z48813 Encounter for surgical aftercare following surgery on the respiratory system: Secondary | ICD-10-CM | POA: Diagnosis not present

## 2018-01-12 DIAGNOSIS — C9110 Chronic lymphocytic leukemia of B-cell type not having achieved remission: Secondary | ICD-10-CM | POA: Diagnosis not present

## 2018-01-12 DIAGNOSIS — E119 Type 2 diabetes mellitus without complications: Secondary | ICD-10-CM | POA: Diagnosis not present

## 2018-01-12 DIAGNOSIS — J181 Lobar pneumonia, unspecified organism: Secondary | ICD-10-CM | POA: Diagnosis not present

## 2018-01-12 DIAGNOSIS — J90 Pleural effusion, not elsewhere classified: Secondary | ICD-10-CM

## 2018-01-12 DIAGNOSIS — R05 Cough: Secondary | ICD-10-CM | POA: Diagnosis not present

## 2018-01-12 MED ORDER — BENZONATATE 200 MG PO CAPS: 200.0000 mg | ORAL_CAPSULE | Freq: Three times a day (TID) | ORAL | 1 refills | Status: DC | PRN

## 2018-01-12 NOTE — Assessment & Plan Note (Signed)
Chronic cough ? Cough variant asthma  He is prone to aspiration /with dysphagia  .-advised on trigger prevention  Add H2 blocker .  Strict D 3 diet and cough control  Check cxr   Plan  Patient Instructions  Chest xray today .  Begin Mucinex DM Twice daily  For cough As needed   Tessalon Three times a day  As needed  Cough .  Follow swallowing precautions to avoid aspiration .  Begin Pepcid 20mg  Twice daily  1 tab in am and 1 tab At bedtime   Follow up with Dr. Ander Slade in 4-6 weeks and As needed   Please contact office for sooner follow up if symptoms do not improve or worsen or seek emergency care

## 2018-01-12 NOTE — Telephone Encounter (Signed)
Pt has ov today with TP and can discuss who to f/u with at appt.

## 2018-01-12 NOTE — Telephone Encounter (Signed)
Fine with me

## 2018-01-12 NOTE — Patient Instructions (Addendum)
Chest xray today .  Begin Mucinex DM Twice daily  For cough As needed   Tessalon Three times a day  As needed  Cough .  Follow swallowing precautions to avoid aspiration .  Begin Pepcid 20mg  Twice daily  1 tab in am and 1 tab At bedtime   Follow up with Dr. Ander Slade in 4-6 weeks and As needed   Please contact office for sooner follow up if symptoms do not improve or worsen or seek emergency care

## 2018-01-12 NOTE — Progress Notes (Signed)
Reviewed

## 2018-01-12 NOTE — Progress Notes (Signed)
_0  ID: Oscar Baker, male    DOB: 1932/12/08, 84 y.o.   MRN: [id number]  Chief Complaint  Patient presents with  . Follow-up    PNA     Referring provider: [name], MD  HPI: 84 yo male former smoker (mainly pipe smoker )  followed for chronic cough  and sinus disease  last seen in office 2016 . Seen by PCCM during hospitalization 11/2017 with Bacteremia and Empyema .  PMH significant for CHF /CM , A Fib , Pacemaker ,CLL  TEST  trial of singulair 08/10/14 > d/c 01/31/15 as not effective  -med calendar 09/11/2014 , 01/08/2015  01/08/2015 Spirometry  normal -with FEV1 82% , ratio 76, FVC 81% Echo 06/2017 EF 45-50%, RA/LA severely dilated CT Chest 6/4>> Moderate to large left pleural effusion with complete collapse of the left lower lobe. 2. Trace right pleural effusion with right lower lobe subsegmental atelectasis. 3. Mild mosaic attenuation throughout the lungs could reflect chronic small airways or small vessel disease. Speech eval 11/23/17 -mild aspiration risk . rec D 3 diet , nectar thick liquid.      01/12/2018 Acute OV: Cough  Patient presents for an acute office visit.  Patient complains that cough persists, has been having for years , has daily cough that is worse after eating and At bedtime  . Feels something ticking in throat.  Patient had a complicated recent medical history.  Patient fell earlier in 2019.  He had a left lower lobe pneumonia.  He required placement in a skilled nursing facility.  He was treated with antibiotics.  But developed a progressive left pleural effusion which was quite extensive into the entire left pleural space.  Patient was seen by thoracic surgery and underwent thoracoscopy for empyema .patient had postop complications and required reexploration on June 11 to remove clots in the pleural space.  A left VATS with decortication.  Found to have H influenza bacteremia felt from a pulmonary source.  Was treated with aggressive IV  antibiotics. Since discharge patient is feeling better, getting stronger, is now back home. Using walker.  Has finished PT at home , going to go to OP PT.  Seen by TS 2 weeks ago, CXR is improved with small round pleural denisty possible loculated effusion .  He is not coughing up discolored mucus says he feels good. No fever or weight loss.  Wife says he does not follow aspiration diet . Found to be at risk for aspiration and rec on D 3 diet with nectar thick liquids . We discussed in detail these recs .      Allergies  Allergen Reactions  . Altace [Ramipril] Other (See Comments)    "throat felt like had a knot in it"  . Codeine Nausea And Vomiting    Nausea and vomiting   . Simvastatin Other (See Comments)    Leg aches    Immunization History  Administered Date(s) Administered  . Tdap 07/19/2017    Past Medical History:  Diagnosis Date  . Asthma 1950's   history of  . Atrial fibrillation ([place])   . Bilateral carpal tunnel syndrome   . Bilateral lower extremity edema   . Bladder tumor   . Chronic systolic heart failure (HCC)    Echo 1/19: Mild LVH, EF 45-50, inf HK, MAC, severe LAE, severe RAE // Echo 7/15: Mild LVH, mod focal basal sept hypertrophy, EF 55-60, AV peak and mean 16/9, trivial MR, mod LAE, PASP 38  . CLL (chronic  lymphocytic leukemia) [facility]) oncologist-  dr Ilene Qua--   dx [phone] ;  Lymphocytosis, CLL - per lov note 05-11-2017 currently under active survillance,  CT 04-17-2014 show very small lymphadenopathy, no indication for treatment  . Coronary artery disease    cardiologist-  dr Cathie Olden--  08-18-2017 Intermittant risk nuclear study w/ large area of inferior infartion with no evidence ishcemia   . 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 Diabetes mellitus type 2, noninsulin dependent ([place])   . Elevated PSA    since prostatectomy but now resolved  . Hematuria 04/2017  . History of ear infection    Right  . History of MI (myocardial infarction)    per myoview nuclear  study 08-18-2017 , unknown when  . History of shingles 08/2017   L ear and scalp, possible  . Hyperlipidemia   . Hypertension   . Ischemic cardiomyopathy 09/01/2017   Presumed +CAD with Nuclear stress test 08/18/17 - Inferior scar, no ischemia, intermediate risk // med management unless +angina or worse dyspnea  . OA (osteoarthritis)   . Pacemaker 02/08/2014   followed by dr g. taylor--  single chamber Biotronik due to SSS  . Permanent atrial fibrillation ([place])   . Pneumonia 2019   Left lung  . Prostate cancer [facility]) urologist-  dr Diona Fanti   dx 2004--  Gleason 8, PSA 10.45--  11-28-2002  s/p  radical prostatectomy;  recurrent w/ increasing PSA, started ADT treatment  . RBBB (right bundle branch block)   . Sick sinus syndrome ([place])    a-Flutter with episodes of bradycardia; S/P Biotronik (serial number [id number]) 02-08-2014  . Urinary incontinence   . Wears hearing aid in right ear    receiver and transmitter    Tobacco History: Social History   Tobacco Use  Smoking Status Former Smoker  . Years: 25.00  . Types: Pipe, Cigars  . Last attempt to quit: 11/25/1975  . Years since quitting: 42.1  Smokeless Tobacco Never Used   Counseling given: Not Answered   Outpatient Medications Prior to Visit  Medication Sig Dispense Refill  . acetaminophen (TYLENOL) 500 MG tablet Take 2 tablets (1,000 mg total) by mouth every 6 (six) hours. 30 tablet 0  . atorvastatin (LIPITOR) 20 MG tablet TAKE 1 TABLET BY MOUTH DAILY AT 6PM 90 tablet 0  . fenofibrate micronized (LOFIBRA) 200 MG capsule TAKE 1 CAPSULE BY MOUTH DAILY BEFORE BREAKFAST 90 capsule 3  . guaiFENesin (MUCINEX) 600 MG 12 hr tablet Take by mouth 2 (two) times daily.    . metFORMIN (GLUCOPHAGE) 1000 MG tablet Take 1,000 mg by mouth 2 (two) times daily with a meal.      . multivitamin (THERAGRAN) per tablet Take 1 tablet by mouth daily. Will stop prior to procedure    . sitaGLIPtin (JANUVIA) 100 MG tablet Take 100 mg by mouth daily.    .  vitamin B-12 (CYANOCOBALAMIN) 1000 MCG tablet Take 1,000 mcg by mouth daily. Will stop prior to procedure    . warfarin (COUMADIN) 5 MG tablet TAKE AS DIRECTED BY COUMADIN CLINIC (Patient taking differently: Take 5-7.5 mg by mouth See admin instructions. Take 7.21m on Saturdays and 521mon all other days or TAKE AS DIRECTED BY COUMADIN CLINIC) 90 tablet 1  . carvedilol (COREG) 3.125 MG tablet Take 1 tablet (3.125 mg total) by mouth 2 (two) times daily. 180 tablet 3  . furosemide (LASIX) 20 MG tablet Take 1 tablet (20 mg total) by mouth daily as needed. 30 tablet 6  No facility-administered medications prior to visit.      Review of Systems  Constitutional:   No  weight loss, night sweats,  Fevers, chills, _+ fatigue, or  lassitude.  HEENT:   No headaches,  Difficulty swallowing,  Tooth/dental problems, or  Sore throat,                No sneezing, itching, ear ache,  +nasal congestion, post nasal drip,   CV:  No chest pain,  Orthopnea, PND, swelling in lower extremities, anasarca, dizziness, palpitations, syncope.   GI  No heartburn, indigestion, abdominal pain, nausea, vomiting, diarrhea, change in bowel habits, loss of appetite, bloody stools.   Resp:  No chest wall deformity  Skin: no rash or lesions.  GU: no dysuria, change in color of urine, no urgency or frequency.  No flank pain, no hematuria   MS:  No joint pain or swelling.  No decreased range of motion.  No back pain.    Physical Exam  BP 112/72 (BP Location: Left Arm, Cuff Size: Normal)   Pulse (!) 59   Ht _0  (1.803 m)   Wt 208 lb 9.6 oz (94.6 kg)   SpO2 97%   BMI 29.09 kg/m   GEN: A/Ox3; pleasant , NAD,  Elderly , walker    HEENT:  [place]/AT,  EACs-clear, TMs-wnl, NOSE-clear, THROAT-clear, no lesions, no postnasal drip or exudate noted.   NECK:  Supple w/ fair ROM; no JVD; normal carotid impulses w/o bruits; no thyromegaly or nodules palpated; no lymphadenopathy.    RESP  Clear  P & A; w/o, wheezes/ rales/  or rhonchi. no accessory muscle use, no dullness to percussion  CARD:  RRR, no m/r/g, no peripheral edema, pulses intact, no cyanosis or clubbing.  GI:   Soft & nt; nml bowel sounds; no organomegaly or masses detected.   Musco: Warm bil, no deformities or joint swelling noted.   Neuro: alert, no focal deficits noted.    Skin: Warm, no lesions or rashes    Lab Results:  CBC   BMET  BNP  ProBNP No results found for: PROBNP  Imaging: Dg Chest 2 View  Result Date: 01/12/2018 CLINICAL DATA:  Productive cough and wheezing EXAM: CHEST - 2 VIEW COMPARISON:  12/28/2016 FINDINGS: Cardiac shadow is at the upper limits of normal in size but stable. Pacing device is again seen. Lungs are well aerated bilaterally with stable pleural based density in the left lung base posteriorly. No new focal abnormality is noted. IMPRESSION: Stable loculated pleural effusion posteriorly on the left. No new focal abnormality is noted. Electronically Signed   By: Inez Catalina M.D.   On: 01/12/2018 12:23   Dg Chest 2 View  Result Date: 12/28/2017 CLINICAL DATA:  Empyema. EXAM: CHEST - 2 VIEW COMPARISON:  Radiograph of December 05, 2017. FINDINGS: The heart size and mediastinal contours are within normal limits. Single lead left-sided pacemaker is unchanged in position. No pneumothorax is noted. Right lung is clear. Small rounded pleural based density is noted posteriorly in the left lower lobe consistent with residual empyema or loculated effusion. The visualized skeletal structures are unremarkable. IMPRESSION: Small rounded pleural-based density is noted posteriorly in the left lower lobe consistent with residual empyema or loculated effusion. Electronically Signed   By: Marijo Conception, M.D.   On: 12/28/2017 13:35     Assessment & Plan:   Cough variant asthma Chronic cough ? Cough variant asthma  He is prone to aspiration /with dysphagia  .-advised  on trigger prevention  Add H2 blocker .  Strict D 3 diet  and cough control  Check cxr   Plan  Patient Instructions  Chest xray today .  Begin Mucinex DM Twice daily  For cough As needed   Tessalon Three times a day  As needed  Cough .  Follow swallowing precautions to avoid aspiration .  Begin Pepcid 31m Twice daily  1 tab in am and 1 tab At bedtime   Follow up with Dr. OAnder Sladein 4-6 weeks and As needed   Please contact office for sooner follow up if symptoms do not improve or worsen or seek emergency care        Pleural effusion Left empyema after probable aspiration pna . S/p thoractomy 11/2017  clincially improved. Recent cxr was improved  Check cxr today .   [place] w/ aspiration precuations .       TRexene Edison NP 01/12/2018

## 2018-01-12 NOTE — Assessment & Plan Note (Signed)
Left empyema after probable aspiration pna . S/p thoractomy 11/2017  clincially improved. Recent cxr was improved  Check cxr today .

## 2018-01-12 NOTE — Telephone Encounter (Signed)
Spoke with Lexine Baton with [facility]  She is with the pt now and reports he has been having a prod cough and abnormal lung sounds  Pt not seen since Oct 2016  Needs ov for eval  Appt with TP today at 10:30 am   MW- is it okay if he wants to switch providers? It has not been quite 3 years yet, thanks

## 2018-01-12 NOTE — Assessment & Plan Note (Signed)
Cont w/ aspiration precuations .

## 2018-01-18 ENCOUNTER — Encounter: Payer: Self-pay | Admitting: Rehabilitative and Restorative Service Providers"

## 2018-01-18 ENCOUNTER — Ambulatory Visit: Payer: Medicare Other | Attending: Internal Medicine | Admitting: Rehabilitative and Restorative Service Providers"

## 2018-01-18 ENCOUNTER — Other Ambulatory Visit: Payer: Self-pay

## 2018-01-18 DIAGNOSIS — M6281 Muscle weakness (generalized): Secondary | ICD-10-CM | POA: Insufficient documentation

## 2018-01-18 DIAGNOSIS — R2689 Other abnormalities of gait and mobility: Secondary | ICD-10-CM | POA: Insufficient documentation

## 2018-01-18 DIAGNOSIS — R2681 Unsteadiness on feet: Secondary | ICD-10-CM | POA: Diagnosis not present

## 2018-01-18 NOTE — Therapy (Signed)
[facility] [address] [place], [name], [address] Phone: [phone]   Fax:  [phone]  Physical Therapy Evaluation  Patient Details  Name: Oscar Baker MRN: [id number] Date of Birth: 1933-02-22 Referring Provider: unsteadiness on feet, muscle weakness   Encounter Date: 01/18/2018  PT End of Session - 01/18/18 2130    Visit Number  1    Number of Visits  17 eval + 16 visits    Date for PT Re-Evaluation  03/19/18    Authorization Type  Medicare & BCBS supplement    PT Start Time  1025    PT Stop Time  1105    PT Time Calculation (min)  40 min    Equipment Utilized During Treatment  Gait belt    Activity Tolerance  Patient tolerated treatment well    Behavior During Therapy  WFL for tasks assessed/performed       Past Medical History:  Diagnosis Date  . Asthma 1950's   history of  . Atrial fibrillation ([place])   . Bilateral carpal tunnel syndrome   . Bilateral lower extremity edema   . Bladder tumor   . Chronic systolic heart failure (HCC)    Echo 1/19: Mild LVH, EF 45-50, inf HK, MAC, severe LAE, severe RAE // Echo 7/15: Mild LVH, mod focal basal sept hypertrophy, EF 55-60, AV peak and mean 16/9, trivial MR, mod LAE, PASP 38  . CLL (chronic lymphocytic leukemia) [facility]) oncologist-  dr Ilene Qua--   dx [phone] ;  Lymphocytosis, CLL - per lov note 05-11-2017 currently under active survillance,  CT 04-17-2014 show very small lymphadenopathy, no indication for treatment  . Coronary artery disease    cardiologist-  dr Cathie Olden--  08-18-2017 Intermittant risk nuclear study w/ large area of inferior infartion with no evidence ishcemia   . 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 Diabetes mellitus type 2, noninsulin dependent ([place])   . Elevated PSA    since prostatectomy but now resolved  . Hematuria 04/2017  . History of ear infection    Right  . History of MI (myocardial infarction)    per myoview nuclear study 08-18-2017 ,  unknown when  . History of shingles 08/2017   L ear and scalp, possible  . Hyperlipidemia   . Hypertension   . Ischemic cardiomyopathy 09/01/2017   Presumed +CAD with Nuclear stress test 08/18/17 - Inferior scar, no ischemia, intermediate risk // med management unless +angina or worse dyspnea  . OA (osteoarthritis)   . Pacemaker 02/08/2014   followed by dr g. taylor--  single chamber Biotronik due to SSS  . Permanent atrial fibrillation ([place])   . Pneumonia 2019   Left lung  . Prostate cancer [facility]) urologist-  dr Diona Fanti   dx 2004--  Gleason 8, PSA 10.45--  11-28-2002  s/p  radical prostatectomy;  recurrent w/ increasing PSA, started ADT treatment  . RBBB (right bundle branch block)   . Sick sinus syndrome ([place])    a-Flutter with episodes of bradycardia; S/P Biotronik (serial number [id number]) 02-08-2014  . Urinary incontinence   . Wears hearing aid in right ear    receiver and transmitter    Past Surgical History:  Procedure Laterality Date  . APPENDECTOMY    . BACK SURGERY     disk  . CARDIAC CATHETERIZATION  09-03-1999  dr Cathie Olden   abnormal cardiolite study:  minor luminal irregularities but no critial coronary artery stenosis  . CARDIOVASCULAR STRESS TEST  08-18-2017  dr  nasher   Intermediate risk nuclear study w/ large area inferior infarction, no evidence of ishcemia (consistant w/ prior MI)/  study not gated due to frequent PVCs  . CARPAL TUNNEL RELEASE Right 2000  . CARPAL TUNNEL RELEASE Left 11/19/2009  . CATARACT EXTRACTION Right 07/2015  . CATARACT EXTRACTION Left 09/2015  . CYSTOSCOPY N/A 11/04/2017   Procedure: CYSTOSCOPY AND CAUTERIZATION OF BLADDER;  Surgeon: Franchot Gallo, MD;  Location: [facility];  Service: Urology;  Laterality: N/A;  . INSERTION PENILE PROSTHESIS  02-22-2004    dr Mattie Marlin  [facility]  . IR THORACENTESIS ASP PLEURAL SPACE W/IMG GUIDE  11/26/2017  . KNEE ARTHROSCOPY Left 07/2010  . PERMANENT PACEMAKER INSERTION N/A 02/08/2014    Procedure: PERMANENT PACEMAKER INSERTION;  Surgeon: Evans Lance, MD;  Location[facility] CATH LAB;  Service: Cardiovascular;  Laterality: N/A;  . PLEURAL EFFUSION DRAINAGE Left 11/30/2017   Procedure: DRAINAGE OF HEMOTHORAX;  Surgeon: Ivin Poot, MD;  Location: [facility];  Service: Thoracic;  Laterality: Left;  . RADICAL RETROPUBIC PROSTATECTOMY W/ BILATERAL PELVIC LYMPH NODE DISSECTION  11-28-2002   dr Mattie Marlin  [facility]  . TONSILLECTOMY    . TOTAL HIP ARTHROPLASTY Left 05/12/2016   Procedure: LEFT TOTAL HIP ARTHROPLASTY ANTERIOR APPROACH;  Surgeon: Paralee Cancel, MD;  Location: [facility];  Service: Orthopedics;  Laterality: Left;  . TOTAL HIP ARTHROPLASTY Right 07-15-2006   dr Alvan Dame  [facility]  . TRANSTHORACIC ECHOCARDIOGRAM  07/20/2017   mild LVH, ef 45-50%, hypokinesis of the basal-midinferior myocardium, due to AFib unable to evaluate diastolic function/  severe LAE and RAE/  trivial PR and TR  . VIDEO ASSISTED THORACOSCOPY Left 11/30/2017   Procedure: VIDEO ASSISTED THORACOSCOPY;  Surgeon: Ivin Poot, MD;  Location: [facility];  Service: Thoracic;  Laterality: Left;  [name] VIDEO ASSISTED THORACOSCOPY (VATS)/EMPYEMA Left 11/29/2017   Procedure: VIDEO ASSISTED THORACOSCOPY (VATS)/EMPYEMA;  Surgeon: Ivin Poot, MD;  Location: [facility];  Service: Thoracic;  Laterality: Left;    There were no vitals filed for this visit.   Subjective Assessment - 01/18/18 1030    Subjective  The patient is known to our clinic from PT from 10/20/2017-11/20/2017.  He was readmitted to the hospital due to pneumonia and d/c home 12/05/17.  He notes he is getting stronger and walking daily with RW, but has continued concerns regarding his balance.      Patient is accompained by:  Family member    Pertinent History  Left THA 05/12/16, right THA 2008, CHF, MI, DM2, HTN, OA, prostate CA, shingles, A-fib, hearing loss, knee sg, recent thorascopy.  05/02/2017- bleeding and began with urinary incontinence    Patient Stated Goals  work on  balance, "be able to walk, get off the walker (I'll live with a cane)".  Patient has used the walker since January when he fell.    Currently in Pain?  Yes    Pain Score  -- "if I move a certain way"    Pain Location  Rib cage    Pain Descriptors / Indicators  Sore    Pain Onset  More than a month ago    Pain Frequency  Intermittent    Aggravating Factors   moving a certain way    Pain Relieving Factors  sitting         [facility] PT Assessment - 01/18/18 1035      Assessment   Medical Diagnosis  [name], MD    Referring Provider  unsteadiness on  feet, muscle weakness    Onset Date/Surgical Date  11/25/17    Hand Dominance  Right    Prior Therapy  prior outpatient PT      Precautions   Precautions  Fall      Restrictions   Weight Bearing Restrictions  No      Balance Screen   Has the patient fallen in the past 6 months  Yes    How many times?  1- in June (in bathroom)- tried to walk without RW    Has the patient had a decrease in activity level because of a fear of falling?   Yes due to medical complications    Is the patient reluctant to leave their home because of a fear of falling?   No      Home Environment   Living Environment  Private residence    Living Arrangements  Spouse/significant other    Type of [place]  One level    [place] - 2 wheels;Cane - single point;Grab bars - tub/shower;Toilet riser      Prior Function   Level of Independence  Independent;Independent with household mobility without device;Independent with community mobility without device    Leisure  walking a little      Observation/Other Assessments-Edema    Edema  -- CHF- has ankle swelling      Sensation   Light Touch  Appears Intact      ROM / Strength   AROM / PROM / Strength  AROM;Strength      AROM   Overall AROM   Within functional limits for tasks performed      Strength   Overall Strength Comments  5/5 bilat UEs, 5/5  t/o bilat LEs except hip flexion 3/5 bilaterally      Transfers   Transfers  Sit to Stand;Stand to Sit    Sit to Stand  6: Modified independent (Device/Increase time);With upper extremity assist    Five time sit to stand comments   15.84 seconds with UE support    Stand to Sit  6: Modified independent (Device/Increase time)    Stand to Sit Details  slow pace      Ambulation/Gait   Ambulation/Gait  Yes    Ambulation/Gait Assistance  5: Supervision    Ambulation Distance (Feet)  200 Feet    Assistive device  Rolling walker    Ambulation Surface  Indoor;Level    Gait velocity  2.31 ft/sec with RW      Standardized Balance Assessment   Standardized Balance Assessment  Berg Balance Test;Timed Up and Go Test      Berg Balance Test   Sit to Stand  Able to stand  independently using hands    Standing Unsupported  Able to stand safely 2 minutes    Sitting with Back Unsupported but Feet Supported on Floor or Stool  Able to sit safely and securely 2 minutes    Stand to Sit  Sits safely with minimal use of hands    Transfers  Able to transfer safely, definite need of hands    Standing Unsupported with Eyes Closed  Able to stand 10 seconds with supervision    Standing Ubsupported with Feet Together  Needs help to attain position but able to stand for 30 seconds with feet together    From Standing, Reach Forward with Outstretched Arm  Reaches forward but needs supervision  From Standing Position, Pick up Object from [place] to pick up shoe, needs supervision    From Standing Position, Turn to Look Behind Over each Shoulder  Turn sideways only but maintains balance    Turn 360 Degrees  Needs assistance while turning    Standing Unsupported, Alternately Place Feet on Step/Stool  Needs assistance to keep from falling or unable to try    Standing Unsupported, One Foot in Front  Loses balance while stepping or standing    Standing on One Leg  Unable to try or needs assist to prevent fall     Total Score  28    Berg comment:  28/56      Timed Up and Go Test   TUG  -- 17.47 seconds with RW           Vestibular Assessment - 01/18/18 1059      Vestibular Assessment   General Observation  The patient reports double vision *He notes this will be corrected soon.      Vestibulo-Occular Reflex   Comment  Head impulse test positive with larger refixation saccade to the right side. refixation saccade to the left.            Objective measurements completed on examination: See above findings.              PT Education - 01/18/18 2130    Education Details  goals of PT    Person(s) Educated  Patient    Methods  Explanation    Comprehension  Verbalized understanding       PT Short Term Goals - 01/18/18 2136      PT SHORT TERM GOAL #1   Title  The patient will return demo HEP for gaze adaptation, hip flexor strengthening, balance and general mobility.  (STG goals due 02/17/18)    Time  4    Period  Weeks    Target Date  02/17/18      PT SHORT TERM GOAL #2   Title  The patient will improve Berg from 28/56 to > or equal to 34/56 to demonstrate improving steady state balance without UE support.    Time  4    Period  Weeks    Target Date  02/17/18      PT SHORT TERM GOAL #3   Title  The patient will improve TUG with RW from 17.47 seconds to < or equal to 14 seconds to demo improving functional mobility and dec'ing risk for falls.    Time  4    Period  Weeks    Target Date  02/17/18      PT SHORT TERM GOAL #4   Title  The patient will perform sit<>stand without UE support to demo improved functional strength in LEs.    Time  4    Period  Weeks    Target Date  02/17/18      PT SHORT TERM GOAL #5   Title  The patient will improve gait speed from 2.31 ft/sec to > or equal to 2.62 ft/sec to transition to "full community ambulator" classification of gait.    Time  4    Period  Weeks    Target Date  02/17/18        PT Long Term Goals - 01/18/18 2140       PT LONG TERM GOAL #1   Title  The patient will return demo progression of HEP and ongoing wellness plan.  (LTGs due  date: 03/19/18)    Time  8    Period  Weeks    Target Date  03/19/18      PT LONG TERM GOAL #2   Title  The patient will improve Berg score from 28/56 to > or equal to 40/56 to demo dec'ing risk for falls.    Time  8    Period  Weeks    Target Date  03/19/18      PT LONG TERM GOAL #3   Title  The patient will perform 5 time sit<>stand without UE support in < or equal to 14 seconds.    Time  8    Period  Weeks    Target Date  03/19/18      PT LONG TERM GOAL #4   Title  The patient will ambulate short community distances with least restrictive device mod indep (x 500 ft).    Time  8    Period  Weeks    Target Date  03/19/18      PT LONG TERM GOAL #5   Title  The patient will negotiate household surfaces with SPC (if appropriate as he progresses) mod indep x 300 ft.    Time  8    Period  Weeks    Target Date  03/19/18             Plan - 01/18/18 2144    Clinical Impression Statement  The patient is an 84 year old male known to our clinic from prior PT that returns today after being hospitalized for pneumonia 6/1-6/17/19.  He presents with continued impairments in balance scoring 28/56 on Berg balance test and 17.47 seconds on TUG.  He notes his balance is his main concern with significant decline associated with fall in January that coincided with sudden R hearing loss + acute ear infection.  Patient has positive bilateral head impulse test (R refixation saccade larger amplitude) indicaitng diminished use of VOR and vestibular inputs for balance.  PT to address deficits to optimize functional mobility and improve independence for household and community mobility.    History and Personal Factors relevant to plan of care:  left THA 04/2016, right THA 2008, CHF, MI, DM2, HTN, OA, prostate CA, shingles, A-fib, hearing loss, knee surgery, falls, pneumonia     Clinical Presentation  Evolving    Clinical Presentation due to:  recent hospitalization, h/o falls, risk for falls    Clinical Decision Making  Moderate    Rehab Potential  Good    PT Frequency  2x / week eval +    PT Duration  8 weeks    PT Treatment/Interventions  ADLs/Self Care Home Management;DME Instruction;Gait training;Stair training;Functional mobility training;Therapeutic exercise;Therapeutic activities;Balance training;Neuromuscular re-education;Patient/family education;Vestibular;Canalith Repostioning    PT Next Visit Plan  Establish HEP including gaze adaptation x 1 viewing (seated to begin), corner balance exercises dec'ing UE support, would be appropriate for [facility], gait activities with RW, dynamic balance in clinic (head motion with balance and turns near support).    Consulted and Agree with Plan of Care  Patient;Family member/caregiver    Family Member Consulted  wife, Naheem Mosco       Patient will benefit from skilled therapeutic intervention in order to improve the following deficits and impairments:  Abnormal gait, Decreased activity tolerance, Decreased balance, Decreased endurance, Decreased coordination, Decreased knowledge of use of DME, Decreased mobility, Decreased strength, Postural dysfunction  Visit Diagnosis: Muscle weakness (generalized)  Unsteadiness on feet  Other abnormalities of  gait and mobility     Problem List Patient Active Problem List   Diagnosis Date Noted  . Encounter for therapeutic drug monitoring 12/20/2017  . S/P thoracotomy 11/29/2017  . Sepsis ([place])   . Hypoxemia   . Pneumonia of left lower lobe due to infectious organism ([place])   . Mediastinal lymphadenopathy   . Chronic cough 11/23/2017  . CLL (chronic lymphocytic leukemia) ([place]) 11/23/2017  . Pleural effusion 11/23/2017  . HCAP (healthcare-associated pneumonia) 11/20/2017  . Anemia 11/20/2017  . Ischemic cardiomyopathy 09/01/2017  . Right hip pain   . Acute otitis media    . Chronic systolic CHF (congestive heart failure) ([place])   . Pressure injury of skin 07/20/2017  . Malignant otitis externa 07/19/2017  . Fall at home, initial encounter 07/19/2017  . S/P left THA, AA 05/12/2016  . LPRD (laryngopharyngeal reflux disease) 05/21/2015  . Other allergic rhinitis 05/21/2015  . Obesity 12/12/2014  . Upper airway cough syndrome 12/11/2014  . Cough variant asthma 07/27/2014  . Wheezing 06/20/2014  . Symptomatic bradycardia - s/p Biotronik (serial number [id number]) 02/08/2014  . Diabetes mellitus type 2, noninsulin dependent ([place])   . Edema of foot 03/06/2013  . Traumatic ecchymosis of right foot 03/06/2013  . Bone spur 12/30/2012  . Pain of toe of left foot 12/30/2012  . Hyperlipidemia 07/13/2012  . HTN (hypertension) 04/12/2012  . Sick sinus syndrome ([place]) 08/17/2011  . Permanent atrial fibrillation ([place]) 09/16/2010    Zuri Bradway, PT 01/18/2018, 9:49 PM  [facility] [address] [place], [name], [address] Phone: [phone]   Fax:  [phone]  Name: Oscar Baker MRN: [id number] Date of Birth: January 14, 1933

## 2018-01-19 ENCOUNTER — Ambulatory Visit: Payer: Medicare Other | Admitting: Physician Assistant

## 2018-01-19 ENCOUNTER — Encounter: Payer: Self-pay | Admitting: Physician Assistant

## 2018-01-19 ENCOUNTER — Ambulatory Visit (INDEPENDENT_AMBULATORY_CARE_PROVIDER_SITE_OTHER): Payer: Medicare Other | Admitting: Physician Assistant

## 2018-01-19 ENCOUNTER — Ambulatory Visit (INDEPENDENT_AMBULATORY_CARE_PROVIDER_SITE_OTHER): Payer: Medicare Other | Admitting: Cardiovascular Disease

## 2018-01-19 ENCOUNTER — Telehealth: Payer: Self-pay | Admitting: Cardiology

## 2018-01-19 VITALS — BP 120/60 | HR 61 | Ht 70.0 in | Wt 209.0 lb

## 2018-01-19 DIAGNOSIS — Z950 Presence of cardiac pacemaker: Secondary | ICD-10-CM | POA: Diagnosis not present

## 2018-01-19 DIAGNOSIS — J869 Pyothorax without fistula: Secondary | ICD-10-CM

## 2018-01-19 DIAGNOSIS — I482 Chronic atrial fibrillation: Secondary | ICD-10-CM | POA: Diagnosis not present

## 2018-01-19 DIAGNOSIS — Z5181 Encounter for therapeutic drug level monitoring: Secondary | ICD-10-CM | POA: Diagnosis not present

## 2018-01-19 DIAGNOSIS — I255 Ischemic cardiomyopathy: Secondary | ICD-10-CM | POA: Diagnosis not present

## 2018-01-19 DIAGNOSIS — C9110 Chronic lymphocytic leukemia of B-cell type not having achieved remission: Secondary | ICD-10-CM | POA: Diagnosis not present

## 2018-01-19 DIAGNOSIS — J189 Pneumonia, unspecified organism: Secondary | ICD-10-CM | POA: Diagnosis not present

## 2018-01-19 DIAGNOSIS — I2510 Atherosclerotic heart disease of native coronary artery without angina pectoris: Secondary | ICD-10-CM | POA: Diagnosis not present

## 2018-01-19 DIAGNOSIS — I480 Paroxysmal atrial fibrillation: Secondary | ICD-10-CM

## 2018-01-19 DIAGNOSIS — Z48813 Encounter for surgical aftercare following surgery on the respiratory system: Secondary | ICD-10-CM | POA: Diagnosis not present

## 2018-01-19 DIAGNOSIS — I10 Essential (primary) hypertension: Secondary | ICD-10-CM | POA: Diagnosis not present

## 2018-01-19 DIAGNOSIS — I4821 Permanent atrial fibrillation: Secondary | ICD-10-CM

## 2018-01-19 DIAGNOSIS — I5022 Chronic systolic (congestive) heart failure: Secondary | ICD-10-CM

## 2018-01-19 DIAGNOSIS — E119 Type 2 diabetes mellitus without complications: Secondary | ICD-10-CM | POA: Diagnosis not present

## 2018-01-19 LAB — POCT INR: INR: 2.7 (ref 2.0–3.0)

## 2018-01-19 MED ORDER — POTASSIUM CHLORIDE ER 10 MEQ PO TBCR: 10.0000 meq | EXTENDED_RELEASE_TABLET | Freq: Every day | ORAL | 3 refills | Status: DC

## 2018-01-19 MED ORDER — FUROSEMIDE 20 MG PO TABS: 20.0000 mg | ORAL_TABLET | Freq: Every day | ORAL | 3 refills | Status: DC

## 2018-01-19 NOTE — Progress Notes (Signed)
Cardiology Office Note:    Date:  01/19/2018   ID:  Oscar Baker, Oscar Baker [birth year], MRN [id number]  PCP:  [name], MD  Cardiologist:  Mertie Moores, MD  Electrophysiologist:  Cristopher Peru, MD  Pulmonologist:  Dr. Ander Slade Oncologist:  Dr. Alen Blew  Referring MD: [name], MD   Chief Complaint  Patient presents with  . Hospitalization Follow-up    Admitted with pneumonia, pleural effusion status post VATS    History of Present Illness:    Oscar Baker is a 84 y.o. male with atrialfibrillation/flutter, sick sinus syndrome s/p pacemaker in 2015,systolic heart failure secondary to presumed ischemic cardiomyopathy, diabetes, hypertension, hyperlipidemia, chronic lymphocytic leukemia, prostate cancer, bladder tumor.CHADS2-VASc=4 (HTN, DM, 84 yo).He is on long-term anticoagulation with Coumadin.   He had a mechanical fall in January 2019 and was noted to have pulmonary vascular congestion on chest x-ray with an EF of 45-50% and inferior hypokinesis on echocardiogram.  He was treated with IV Lasix.  A follow-up nuclear study in February 2019 demonstrated inferior scar but no ischemia.  I reviewed his case with Dr. Acie Fredrickson and continued medical therapy was recommended unless he has unstable symptoms.   He was admitted in June 2019 with with left lower lobe pneumonia with associated effusion/empyema.  Two attempts at thoracentesis were unsuccessful.  He was seen by thoracic surgery and underwent left video-assisted thorascopic surgery and decortication of the left lower lobe (Dr. Prescott Gum).  Coumadin was held at discharge due to issues with bleeding, but was ultimately resumed.  He has seen Pulmonology and TCTS in follow up.  Repeat CXR's continue to show small residual empyema/loculated effusion which is expected to resolve over time.    Oscar Baker returns for follow-up.  He is here today with his wife.  He continues to have a cough, especially after eating.  He was seen by speech  therapy in the hospital and is supposed to be following a modified diet.  However, he finds this quite difficult.  He denies any fever.  He continues to note symptoms of bendopnea but denies significant exertional dyspnea.  He denies orthopnea, PND.  He has increasing lower extremity edema throughout the day.  He denies chest discomfort or syncope.  He denies any bleeding issues.  He probably takes Lasix 5 times a week.  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 Asthma 1950's   history of  . Atrial fibrillation ([place])   . Bilateral carpal tunnel syndrome   . Bilateral lower extremity edema   . Bladder tumor   . Chronic systolic heart failure (HCC)    Echo 1/19: Mild LVH, EF 45-50, inf HK, MAC, severe LAE, severe RAE // Echo 7/15: Mild LVH, mod focal basal sept hypertrophy, EF 55-60, AV peak and mean 16/9, trivial MR, mod LAE, PASP 38  . CLL (chronic lymphocytic leukemia) [facility]) oncologist-  dr Ilene Qua--   dx  [phone] ;  Lymphocytosis, CLL - per lov note 05-11-2017 currently under active survillance,  CT 04-17-2014 show very small lymphadenopathy, no indication for treatment  . Coronary artery disease    cardiologist-  dr Cathie Olden--  08-18-2017 Intermittant risk nuclear study w/ large area of inferior infartion with no evidence ishcemia   . 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 Diabetes mellitus type 2, noninsulin  dependent ([place])   . Elevated PSA    since prostatectomy but now resolved  . Hematuria 04/2017  . History of ear infection    Right  . History of MI (myocardial infarction)    per myoview nuclear study 08-18-2017 , unknown when  . History of shingles 08/2017   L ear and scalp, possible  . Hyperlipidemia   . Hypertension   . Ischemic cardiomyopathy 09/01/2017   Presumed +CAD with Nuclear stress test 08/18/17 - Inferior scar, no ischemia, intermediate risk // med management unless +angina or worse dyspnea  . OA (osteoarthritis)   . Pacemaker 02/08/2014   followed by dr g. taylor--  single chamber Biotronik due to SSS  . Permanent atrial fibrillation ([place])   . Pneumonia 2019   Left lung  . Prostate cancer [facility]) urologist-  dr Diona Fanti   dx 2004--  Gleason 8, PSA 10.45--  11-28-2002  s/p  radical prostatectomy;  recurrent w/ increasing PSA, started ADT treatment  . RBBB (right bundle branch block)   . Sick sinus syndrome ([place])    a-Flutter with episodes of bradycardia; S/P Biotronik (serial number [id number]) 02-08-2014  . Urinary incontinence   . Wears hearing aid in right ear    receiver and transmitter   Surgical Hx: The patient  has a past surgical history that includes Back surgery; Appendectomy; permanent pacemaker insertion (N/A, 02/08/2014); Total hip arthroplasty (Left, 05/12/2016); Cardiovascular stress test (08-18-2017  dr Cathie Olden); transthoracic echocardiogram (07/20/2017); Cardiac catheterization (09-03-1999  dr Cathie Olden); Tonsillectomy; RADICAL RETROPUBIC PROSTATECTOMY W/ BILATERAL PELVIC LYMPH NODE DISSECTION (11-28-2002   dr Mattie Marlin  [facility]); INSERTION PENILE PROSTHESIS (02-22-2004    dr Mattie Marlin  [facility]); Total hip arthroplasty (Right, 07-15-2006   dr Alvan Dame  [facility]); Carpal tunnel release (Right, 2000); Carpal tunnel release (Left, 11/19/2009); Cataract extraction (Right, 07/2015); Cataract extraction (Left, 09/2015); Knee arthroscopy (Left, 07/2010); Cystoscopy (N/A, 11/04/2017); IR  THORACENTESIS ASP PLEURAL SPACE W/IMG GUIDE (11/26/2017); Video assisted thoracoscopy (vats)/empyema (Left, 11/29/2017); Pleural effusion drainage (Left, 11/30/2017); and Video assisted thoracoscopy (Left, 11/30/2017).   Current Medications: Current Meds  Medication Sig  . acetaminophen (TYLENOL) 500 MG tablet Take 2 tablets (1,000 mg total) by mouth every 6 (six) hours.  [name] atorvastatin (LIPITOR) 20 MG tablet TAKE 1 TABLET BY MOUTH DAILY AT 6PM  . benzonatate (TESSALON) 200 MG capsule Take 1 capsule (200 mg total) by mouth 3 (three) times daily as needed for cough.  . carvedilol (COREG) 3.125 MG tablet Take 1 tablet (3.125 mg total) by mouth 2 (two) times daily.  . fenofibrate micronized (LOFIBRA) 200 MG capsule TAKE 1 CAPSULE BY MOUTH DAILY BEFORE BREAKFAST  . guaiFENesin (MUCINEX) 600 MG 12 hr tablet Take by mouth 2 (two) times daily.  . metFORMIN (GLUCOPHAGE) 1000 MG tablet Take 1,000 mg by mouth 2 (two) times daily with a meal.    . multivitamin (THERAGRAN) per tablet Take 1 tablet by mouth daily. Will stop prior to procedure  . sitaGLIPtin (JANUVIA) 100 MG tablet Take 100 mg by mouth daily.  . vitamin B-12 (CYANOCOBALAMIN) 1000 MCG tablet Take 1,000 mcg by mouth  daily. Will stop prior to procedure  . warfarin (COUMADIN) 5 MG tablet TAKE AS DIRECTED BY COUMADIN CLINIC (Patient taking differently: Take 5-7.5 mg by mouth See admin instructions. Take 7.35m on Saturdays and 580mon all other days or TAKE AS DIRECTED BY COUMADIN CLINIC)  . [DISCONTINUED] furosemide (LASIX) 20 MG tablet Take 1 tablet (20 mg total) by mouth daily as needed.     Allergies:   Altace [ramipril]; Codeine; and Simvastatin   Social History   Tobacco Use  . Smoking status: Former Smoker    Years: 25.00    Types: Pipe, Cigars    Last attempt to quit: 11/25/1975    Years since quitting: 42.1  . Smokeless tobacco: Never Used  Substance Use Topics  . Alcohol use: Yes    Alcohol/week: 0.0 oz    Comment: occasional  . Drug  use: No     Family Hx: The patient's family history includes Allergic rhinitis in his brother and mother; Emphysema in his mother; Heart attack in his father; Pulmonary fibrosis in his brother.  ROS:   Please see the history of present illness.    Review of Systems  Respiratory: Positive for cough.    All other systems reviewed and are negative.   EKGs/Labs/Other Test Reviewed:    EKG:  EKG is not ordered today.    Recent Labs: 07/22/2017: TSH 0.436 11/20/2017: B Natriuretic Peptide 717.8 11/22/2017: Magnesium 1.9 12/04/2017: ALT 24; Hemoglobin 10.4; Platelets 450 12/05/2017: BUN 19; Creatinine, Ser 0.64; Potassium 3.8; Sodium 138   Recent Lipid Panel Lab Results  Component Value Date/Time   CHOL 145 05/28/2015 10:59 AM   TRIG 107 05/28/2015 10:59 AM   HDL 23 (L) 05/28/2015 10:59 AM   CHOLHDL 6.3 (H) 05/28/2015 10:59 AM   LDLCALC 101 05/28/2015 10:59 AM   LDLDIRECT 133.8 07/13/2012 09:19 AM    Physical Exam:    VS:  BP 120/60   Pulse 61   Ht _0  (1.778 m)   Wt 209 lb (94.8 kg)   SpO2 97%   BMI 29.99 kg/m     Wt Readings from Last 3 Encounters:  01/19/18 209 lb (94.8 kg)  01/12/18 208 lb 9.6 oz (94.6 kg)  12/28/17 198 lb (89.8 kg)     Physical Exam  Constitutional: He is oriented to person, place, and time. He appears well-developed and well-nourished. No distress.  HENT:  Head: Normocephalic and atraumatic.  Eyes: No scleral icterus.  Neck: Neck supple.  Cardiovascular: Normal rate and regular rhythm.  No murmur heard. Pulmonary/Chest: Effort normal. He has rales (dry) in the left lower field.  Abdominal: Soft.  Musculoskeletal: He exhibits edema (1+ bilat LE edema).  Neurological: He is alert and oriented to person, place, and time.  Skin: Skin is warm and dry.  Psychiatric: He has a normal mood and affect.    ASSESSMENT & PLAN:    Chronic systolic CHF (congestive heart failure) (HCC)  EF 45-50 by echocardiogram in 1/19.  It is presumed he has an  ischemic cardiomyopathy given inferior scar noted on his prior nuclear stress test.  He has been managed medically.  He continues to have issues with lower extremity swelling.  I think he would do better taking Lasix on a daily basis.  He has a hx of allergy to ACE inhibitors.  His BP has previously not allowed the addition of hydralazine/nitrates.    -Increase Lasix to 20 mg daily  -Start potassium 10 medical events daily  -BMET 1  week   -Consider hydralazine +/- nitrates at follow-up  PAF (paroxysmal atrial fibrillation) (HCC) Continue anticoagulation with Coumadin.  Empyema of left pleural space [facility]) Recent hospitalization ultimately treated with left VATS.  He has residual crackles in his left base.  Most recent chest x-rays demonstrate some residual effusion/empyema.  His home health nurse recently called due to concerns over abnormal left lung base sounds.  I reviewed his chest x-ray images personally.  His aeration is significantly improved when compared to films prior to his surgery.  His symptoms do not suggest any worsening.  I have reassured him that this will improve.  He will need to continue follow-up with pulmonology as planned.  Essential hypertension  The patient's blood pressure is controlled on his current regimen.  Continue current therapy.   Pacemaker in situ Continue follow-up with EP as planned.   Dispo:  Return in about 8 weeks (around 03/16/2018) for Routine Follow Up, w/ Dr. Acie Fredrickson, or Richardson Dopp, PA-C.   Medication Adjustments/Labs and Tests Ordered: Current medicines are reviewed at length with the patient today.  Concerns regarding medicines are outlined above.  Tests Ordered: Orders Placed This Encounter  Procedures  . Basic Metabolic Panel (BMET)   Medication Changes: Meds ordered this encounter  Medications  . furosemide (LASIX) 20 MG tablet    Sig: Take 1 tablet (20 mg total) by mouth daily.    Dispense:  90 tablet    Refill:  3  . potassium  chloride (K-DUR) 10 MEQ tablet    Sig: Take 1 tablet (10 mEq total) by mouth daily.    Dispense:  90 tablet    Refill:  3    Signed, Richardson Dopp, PA-C  01/19/2018 5:05 PM    [facility] [place], [place], [place]  [address] Phone: [phone]; Fax: [phone]

## 2018-01-19 NOTE — Telephone Encounter (Signed)
See office note from today. Richardson Dopp, PA-C    01/19/2018 5:31 PM

## 2018-01-19 NOTE — Telephone Encounter (Signed)
Richardson Dopp, PA to address at [place] today

## 2018-01-19 NOTE — Patient Instructions (Addendum)
Medication Instructions:  1. INCREASE LASIX TO 20 MG EVERY DAY  2. START POTASSIUM 10 MEQ DAILY; RX HAS BEEN SENT IN  Labwork: BMET TO BE DONE IN 1 WEEK; YOU HAVE BEEN GIVEN A PRESCRIPTION TO HAVE THE HOME HEALTH NURSE DRAW LAB WORK; IF THE NURSE IS NOT ABLE TO GET THE LAB WORK YOU WILL NEED TO HAVE LAB WORK DONE IN OUR OFFICE TO BE DONE ON 01/26/18  Testing/Procedures: NONE ORDERED TODAY  Follow-Up: [place], [facility] 02/01/18 @ 8:15   Any Other Special Instructions Will Be Listed Below (If Applicable).     If you need a refill on your cardiac medications before your next appointment, please call your pharmacy.

## 2018-01-19 NOTE — Telephone Encounter (Signed)
New Message   Per Nicki at [facility] , pt is having some difficulty with left lower lobe. Need to be checked out at the visit today to make sure it is not in connection with his heart.

## 2018-01-19 NOTE — Patient Instructions (Signed)
Description   Spoke with Edwyna Ready, RN with [facility] in home with pt and advised pt to continue taking 1 tablet every day except 1 and 1/2  tablets on Saturdays. Recheck INR in 3 weeks in the office.  Call [facility] [phone] with any concerns.

## 2018-01-21 LAB — ACID FAST CULTURE WITH REFLEXED SENSITIVITIES (MYCOBACTERIA): Acid Fast Culture: NEGATIVE

## 2018-01-24 ENCOUNTER — Encounter: Payer: Self-pay | Admitting: Rehabilitative and Restorative Service Providers"

## 2018-01-24 ENCOUNTER — Ambulatory Visit: Payer: Medicare Other | Attending: Internal Medicine | Admitting: Rehabilitative and Restorative Service Providers"

## 2018-01-24 DIAGNOSIS — R296 Repeated falls: Secondary | ICD-10-CM | POA: Diagnosis not present

## 2018-01-24 DIAGNOSIS — M6281 Muscle weakness (generalized): Secondary | ICD-10-CM | POA: Diagnosis not present

## 2018-01-24 DIAGNOSIS — R2681 Unsteadiness on feet: Secondary | ICD-10-CM | POA: Diagnosis not present

## 2018-01-24 DIAGNOSIS — R2689 Other abnormalities of gait and mobility: Secondary | ICD-10-CM | POA: Diagnosis not present

## 2018-01-24 NOTE — Therapy (Signed)
[facility] [address] [place], [name], [address] Phone: [phone]   Fax:  [phone]  Physical Therapy Treatment  Patient Details  Name: BERDELL HOSTETLER MRN: [id number] Date of Birth: 1932-10-19 Referring Provider: unsteadiness on feet, muscle weakness   Encounter Date: 01/24/2018  PT End of Session - 01/24/18 1437    Visit Number  2    Number of Visits  17 eval + 16 visits    Date for PT Re-Evaluation  03/19/18    Authorization Type  Medicare & BCBS supplement    PT Start Time  0935    PT Stop Time  1015    PT Time Calculation (min)  40 min    Equipment Utilized During Treatment  Gait belt    Activity Tolerance  Patient tolerated treatment well    Behavior During Therapy  WFL for tasks assessed/performed       Past Medical History:  Diagnosis Date  . Asthma 1950's   history of  . Atrial fibrillation ([place])   . Bilateral carpal tunnel syndrome   . Bilateral lower extremity edema   . Bladder tumor   . Chronic systolic heart failure (HCC)    Echo 1/19: Mild LVH, EF 45-50, inf HK, MAC, severe LAE, severe RAE // Echo 7/15: Mild LVH, mod focal basal sept hypertrophy, EF 55-60, AV peak and mean 16/9, trivial MR, mod LAE, PASP 38  . CLL (chronic lymphocytic leukemia) [facility]) oncologist-  dr Ilene Qua--   dx [phone] ;  Lymphocytosis, CLL - per lov note 05-11-2017 currently under active survillance,  CT 04-17-2014 show very small lymphadenopathy, no indication for treatment  . Coronary artery disease    cardiologist-  dr Cathie Olden--  08-18-2017 Intermittant risk nuclear study w/ large area of inferior infartion with no evidence ishcemia   . 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 Diabetes mellitus type 2, noninsulin dependent ([place])   . Elevated PSA    since prostatectomy but now resolved  . Hematuria 04/2017  . History of ear infection    Right  . History of MI (myocardial infarction)    per myoview nuclear study 08-18-2017 , unknown  when  . History of shingles 08/2017   L ear and scalp, possible  . Hyperlipidemia   . Hypertension   . Ischemic cardiomyopathy 09/01/2017   Presumed +CAD with Nuclear stress test 08/18/17 - Inferior scar, no ischemia, intermediate risk // med management unless +angina or worse dyspnea  . OA (osteoarthritis)   . Pacemaker 02/08/2014   followed by dr g. taylor--  single chamber Biotronik due to SSS  . Permanent atrial fibrillation ([place])   . Pneumonia 2019   Left lung  . Prostate cancer [facility]) urologist-  dr Diona Fanti   dx 2004--  Gleason 8, PSA 10.45--  11-28-2002  s/p  radical prostatectomy;  recurrent w/ increasing PSA, started ADT treatment  . RBBB (right bundle branch block)   . Sick sinus syndrome ([place])    a-Flutter with episodes of bradycardia; S/P Biotronik (serial number [id number]) 02-08-2014  . Urinary incontinence   . Wears hearing aid in right ear    receiver and transmitter    Past Surgical History:  Procedure Laterality Date  . APPENDECTOMY    . BACK SURGERY     disk  . CARDIAC CATHETERIZATION  09-03-1999  dr Cathie Olden   abnormal cardiolite study:  minor luminal irregularities but no critial coronary artery stenosis  . CARDIOVASCULAR STRESS TEST  08-18-2017  dr  nasher   Intermediate risk nuclear study w/ large area inferior infarction, no evidence of ishcemia (consistant w/ prior MI)/  study not gated due to frequent PVCs  . CARPAL TUNNEL RELEASE Right 2000  . CARPAL TUNNEL RELEASE Left 11/19/2009  . CATARACT EXTRACTION Right 07/2015  . CATARACT EXTRACTION Left 09/2015  . CYSTOSCOPY N/A 11/04/2017   Procedure: CYSTOSCOPY AND CAUTERIZATION OF BLADDER;  Surgeon: Franchot Gallo, MD;  Location: [facility];  Service: Urology;  Laterality: N/A;  . INSERTION PENILE PROSTHESIS  02-22-2004    dr Mattie Marlin  [facility]  . IR THORACENTESIS ASP PLEURAL SPACE W/IMG GUIDE  11/26/2017  . KNEE ARTHROSCOPY Left 07/2010  . PERMANENT PACEMAKER INSERTION N/A 02/08/2014   Procedure:  PERMANENT PACEMAKER INSERTION;  Surgeon: Evans Lance, MD;  Location: [facility] CATH LAB;  Service: Cardiovascular;  Laterality: N/A;  . PLEURAL EFFUSION DRAINAGE Left 11/30/2017   Procedure: DRAINAGE OF HEMOTHORAX;  Surgeon: Ivin Poot, MD;  Location: [facility];  Service: Thoracic;  Laterality: Left;  . RADICAL RETROPUBIC PROSTATECTOMY W/ BILATERAL PELVIC LYMPH NODE DISSECTION  11-28-2002   dr Mattie Marlin  [facility]  . TONSILLECTOMY    . TOTAL HIP ARTHROPLASTY Left 05/12/2016   Procedure: LEFT TOTAL HIP ARTHROPLASTY ANTERIOR APPROACH;  Surgeon: Paralee Cancel, MD;  Location: [facility];  Service: Orthopedics;  Laterality: Left;  . TOTAL HIP ARTHROPLASTY Right 07-15-2006   dr Alvan Dame  [facility]  . TRANSTHORACIC ECHOCARDIOGRAM  07/20/2017   mild LVH, ef 45-50%, hypokinesis of the basal-midinferior myocardium, due to AFib unable to evaluate diastolic function/  severe LAE and RAE/  trivial PR and TR  . VIDEO ASSISTED THORACOSCOPY Left 11/30/2017   Procedure: VIDEO ASSISTED THORACOSCOPY;  Surgeon: Ivin Poot, MD;  Location: [facility];  Service: Thoracic;  Laterality: Left;  [name] VIDEO ASSISTED THORACOSCOPY (VATS)/EMPYEMA Left 11/29/2017   Procedure: VIDEO ASSISTED THORACOSCOPY (VATS)/EMPYEMA;  Surgeon: Ivin Poot, MD;  Location: [facility];  Service: Thoracic;  Laterality: Left;    There were no vitals filed for this visit.  Subjective Assessment - 01/24/18 0931    Subjective  The patient notes he was contacted today to come in (from wait list).  The patient reports he is not doing HEP from [facility] PT because activities are no longer challenging.    Pertinent History  Left THA 05/12/16, right THA 2008, CHF, MI, DM2, HTN, OA, prostate CA, shingles, A-fib, hearing loss, knee sg, recent thorascopy.  05/02/2017- bleeding and began with urinary incontinence    Patient Stated Goals  work on balance, "be able to walk, get off the walker (I'll live with a cane)".  Patient has used the walker since January when he fell.    Currently in  Pain?  Yes    Pain Score  -- intermittent, none at rest    Pain Location  Rib cage    Pain Orientation  Left    Pain Descriptors / Indicators  Sore    Pain Onset  More than a month ago    Aggravating Factors   occasional rib pain    Pain Relieving Factors  sitting                       [facility] Adult PT Treatment/Exercise - 01/24/18 1437      Ambulation/Gait   Ambulation/Gait  Yes    Ambulation/Gait Assistance  5: Supervision    Ambulation/Gait Assistance Details  PT provided cues for shoulder depression and more upright posture.  Ambulation Distance (Feet)  230 Feet x4 reps    Assistive device  Rolling walker    Ambulation Surface  Indoor;Level      Neuro Re-ed    Neuro Re-ed Details   Corner balance standing with eyes open + feet together, 1/2 tandem standing, narrowing base of support near corner for intermittent UE support.  Gaze x 1 adaptation in seated position progressed to standing position with UE support through RW.  PT provided cues on keeping letter/target in focus and going at slow enough speed to maintain some fixation on target.  Standing with head motion in corner for balance.        Exercises   Exercises  Knee/Hip      Knee/Hip Exercises: Stretches   Active Hamstring Stretch  2 reps;30 seconds    Active Hamstring Stretch Limitations  Patient feets hamstring stretchin lateral right hip.          Balance Exercises - 01/24/18 1441      OTAGO PROGRAM   Ankle Plantorflexors  20 reps, support did one UE support, unable to do no UE support    Ankle Dorsiflexors  20 reps, support did with one UE support, unable to do without UE support    Backwards Walking  Support    Sideways Walking  No assistive device cues for foot positioning    Toe Walk  Support Able to do 4-5 steps, fatigues    Sit to Stand  10 reps, bilateral support dec'ing UE support ot one UE        PT Education - 01/24/18 1436    Education provided  Yes    Education Details  HEP  for gaze, LE strengthening and balance    Person(s) Educated  Patient    Methods  Explanation;Demonstration;Handout    Comprehension  Verbalized understanding;Returned demonstration       PT Short Term Goals - 01/18/18 2136      PT SHORT TERM GOAL #1   Title  The patient will return demo HEP for gaze adaptation, hip flexor strengthening, balance and general mobility.  (STG goals due 02/17/18)    Time  4    Period  Weeks    Target Date  02/17/18      PT SHORT TERM GOAL #2   Title  The patient will improve Berg from 28/56 to > or equal to 34/56 to demonstrate improving steady state balance without UE support.    Time  4    Period  Weeks    Target Date  02/17/18      PT SHORT TERM GOAL #3   Title  The patient will improve TUG with RW from 17.47 seconds to < or equal to 14 seconds to demo improving functional mobility and dec'ing risk for falls.    Time  4    Period  Weeks    Target Date  02/17/18      PT SHORT TERM GOAL #4   Title  The patient will perform sit<>stand without UE support to demo improved functional strength in LEs.    Time  4    Period  Weeks    Target Date  02/17/18      PT SHORT TERM GOAL #5   Title  The patient will improve gait speed from 2.31 ft/sec to > or equal to 2.62 ft/sec to transition to "full community ambulator" classification of gait.    Time  4    Period  Weeks    Target  Date  02/17/18        PT Long Term Goals - 01/18/18 2140      PT LONG TERM GOAL #1   Title  The patient will return demo progression of HEP and ongoing wellness plan.  (LTGs due date: 03/19/18)    Time  8    Period  Weeks    Target Date  03/19/18      PT LONG TERM GOAL #2   Title  The patient will improve Berg score from 28/56 to > or equal to 40/56 to demo dec'ing risk for falls.    Time  8    Period  Weeks    Target Date  03/19/18      PT LONG TERM GOAL #3   Title  The patient will perform 5 time sit<>stand without UE support in < or equal to 14 seconds.    Time  8     Period  Weeks    Target Date  03/19/18      PT LONG TERM GOAL #4   Title  The patient will ambulate short community distances with least restrictive device mod indep (x 500 ft).    Time  8    Period  Weeks    Target Date  03/19/18      PT LONG TERM GOAL #5   Title  The patient will negotiate household surfaces with SPC (if appropriate as he progresses) mod indep x 300 ft.    Time  8    Period  Weeks    Target Date  03/19/18            Plan - 01/24/18 1443    Clinical Impression Statement  Today's session focused on establishing a HEP for gaze adaptation, balance and LE strengthening.  The patient is motivated to participate in HEP and therapy.  PT to progress activities in clinic to include turns and head motion with dynamic activities to promote improved use of vestibular inputs for balance.     PT Treatment/Interventions  ADLs/Self Care Home Management;DME Instruction;Gait training;Stair training;Functional mobility training;Therapeutic exercise;Therapeutic activities;Balance training;Neuromuscular re-education;Patient/family education;Vestibular;Canalith Repostioning    PT Next Visit Plan  Ask about HEP questions.  Check gaze x 1 standing with UE support, Dynamic balance activities (compliant surfaces, head motion, turns, etc).  Gait with RW, transitional movements.      Consulted and Agree with Plan of Care  Patient       Patient will benefit from skilled therapeutic intervention in order to improve the following deficits and impairments:  Abnormal gait, Decreased activity tolerance, Decreased balance, Decreased endurance, Decreased coordination, Decreased knowledge of use of DME, Decreased mobility, Decreased strength, Postural dysfunction  Visit Diagnosis: Muscle weakness (generalized)  Unsteadiness on feet  Other abnormalities of gait and mobility  Repeated falls     Problem List Patient Active Problem List   Diagnosis Date Noted  . Encounter for therapeutic  drug monitoring 12/20/2017  . S/P thoracotomy 11/29/2017  . Sepsis ([place])   . Hypoxemia   . Pneumonia of left lower lobe due to infectious organism ([place])   . Mediastinal lymphadenopathy   . Chronic cough 11/23/2017  . CLL (chronic lymphocytic leukemia) ([place]) 11/23/2017  . Pleural effusion 11/23/2017  . HCAP (healthcare-associated pneumonia) 11/20/2017  . Anemia 11/20/2017  . Ischemic cardiomyopathy 09/01/2017  . Right hip pain   . Acute otitis media   . Chronic systolic CHF (congestive heart failure) ([place])   . Pressure injury of skin 07/20/2017  .  Malignant otitis externa 07/19/2017  . Fall at home, initial encounter 07/19/2017  . S/P left THA, AA 05/12/2016  . LPRD (laryngopharyngeal reflux disease) 05/21/2015  . Other allergic rhinitis 05/21/2015  . Obesity 12/12/2014  . Upper airway cough syndrome 12/11/2014  . Cough variant asthma 07/27/2014  . Wheezing 06/20/2014  . Symptomatic bradycardia - s/p Biotronik (serial number [id number]) 02/08/2014  . Diabetes mellitus type 2, noninsulin dependent ([place])   . Edema of foot 03/06/2013  . Traumatic ecchymosis of right foot 03/06/2013  . Bone spur 12/30/2012  . Pain of toe of left foot 12/30/2012  . Hyperlipidemia 07/13/2012  . HTN (hypertension) 04/12/2012  . Sick sinus syndrome ([place]) 08/17/2011  . PAF (paroxysmal atrial fibrillation) ([place]) 09/16/2010    Rachael Zapanta, PT 01/24/2018, 2:46 PM  [facility] [address] [place], [name], [address] Phone: [phone]   Fax:  [phone]  Name: NICK STULTS MRN: [id number] Date of Birth: 1932-08-19

## 2018-01-24 NOTE — Patient Instructions (Addendum)
Access Code: [email or web address]  URL: [email or web address]  Date: 01/24/2018  Prepared by: Rudell Cobb   Exercises Wide Stance with Eyes Closed - 3 reps - 1 sets - 30 hold - 1x daily - 7x weekly Sit to Stand with Armchair - 10 reps - 1 sets - 1x daily - 7x weekly Heel Raises with Counter Support - 20 reps - 1 sets - 1x daily - 7x weekly Toe Raises with Counter Support - 20 reps - 1 sets - 1x daily - 7x weekly Standing Gaze Stabilization with Head Rotation - 1 reps - 1 sets - 2x daily - 7x weekly

## 2018-01-26 ENCOUNTER — Other Ambulatory Visit: Payer: Medicare Other | Admitting: *Deleted

## 2018-01-26 ENCOUNTER — Ambulatory Visit: Payer: Medicare Other | Admitting: Physical Therapy

## 2018-01-26 ENCOUNTER — Encounter: Payer: Self-pay | Admitting: Physical Therapy

## 2018-01-26 VITALS — BP 121/67 | HR 75

## 2018-01-26 DIAGNOSIS — R296 Repeated falls: Secondary | ICD-10-CM | POA: Diagnosis not present

## 2018-01-26 DIAGNOSIS — E119 Type 2 diabetes mellitus without complications: Secondary | ICD-10-CM | POA: Diagnosis not present

## 2018-01-26 DIAGNOSIS — R2681 Unsteadiness on feet: Secondary | ICD-10-CM

## 2018-01-26 DIAGNOSIS — I2510 Atherosclerotic heart disease of native coronary artery without angina pectoris: Secondary | ICD-10-CM | POA: Diagnosis not present

## 2018-01-26 DIAGNOSIS — Z48813 Encounter for surgical aftercare following surgery on the respiratory system: Secondary | ICD-10-CM | POA: Diagnosis not present

## 2018-01-26 DIAGNOSIS — C9110 Chronic lymphocytic leukemia of B-cell type not having achieved remission: Secondary | ICD-10-CM | POA: Diagnosis not present

## 2018-01-26 DIAGNOSIS — I5022 Chronic systolic (congestive) heart failure: Secondary | ICD-10-CM | POA: Diagnosis not present

## 2018-01-26 DIAGNOSIS — M6281 Muscle weakness (generalized): Secondary | ICD-10-CM

## 2018-01-26 DIAGNOSIS — R2689 Other abnormalities of gait and mobility: Secondary | ICD-10-CM

## 2018-01-26 DIAGNOSIS — J189 Pneumonia, unspecified organism: Secondary | ICD-10-CM | POA: Diagnosis not present

## 2018-01-26 DIAGNOSIS — I10 Essential (primary) hypertension: Secondary | ICD-10-CM | POA: Diagnosis not present

## 2018-01-26 DIAGNOSIS — I255 Ischemic cardiomyopathy: Secondary | ICD-10-CM | POA: Diagnosis not present

## 2018-01-26 NOTE — Therapy (Signed)
[facility] [address] [place], [name], [address] Phone: [phone]   Fax:  [phone]  Physical Therapy Treatment  Patient Details  Name: Oscar Baker MRN: [id number] Date of Birth: 1933/04/12 Referring Provider: unsteadiness on feet, muscle weakness   Encounter Date: 01/26/2018  PT End of Session - 01/26/18 1630    Visit Number  3    Number of Visits  17    Date for PT Re-Evaluation  03/19/18    Authorization Type  Medicare & BCBS supplement    Authorization Time Period  medicare guidelines with medical necessity; 100% coverage    PT Start Time  1315    PT Stop Time  1400    PT Time Calculation (min)  45 min    Equipment Utilized During Treatment  Gait belt    Activity Tolerance  Patient tolerated treatment well    Behavior During Therapy  [facility] for tasks assessed/performed       Past Medical History:  Diagnosis Date  . Asthma 1950's   history of  . Atrial fibrillation ([place])   . Bilateral carpal tunnel syndrome   . Bilateral lower extremity edema   . Bladder tumor   . Chronic systolic heart failure (HCC)    Echo 1/19: Mild LVH, EF 45-50, inf HK, MAC, severe LAE, severe RAE // Echo 7/15: Mild LVH, mod focal basal sept hypertrophy, EF 55-60, AV peak and mean 16/9, trivial MR, mod LAE, PASP 38  . CLL (chronic lymphocytic leukemia) [facility]) oncologist-  dr Ilene Qua--   dx [phone] ;  Lymphocytosis, CLL - per lov note 05-11-2017 currently under active survillance,  CT 04-17-2014 show very small lymphadenopathy, no indication for treatment  . Coronary artery disease    cardiologist-  dr Cathie Olden--  08-18-2017 Intermittant risk nuclear study w/ large area of inferior infartion with no evidence ishcemia   . 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 Diabetes mellitus type 2, noninsulin dependent ([place])   . Elevated PSA    since prostatectomy but now resolved  . Hematuria 04/2017  . History of ear infection    Right  . History of MI  (myocardial infarction)    per myoview nuclear study 08-18-2017 , unknown when  . History of shingles 08/2017   L ear and scalp, possible  . Hyperlipidemia   . Hypertension   . Ischemic cardiomyopathy 09/01/2017   Presumed +CAD with Nuclear stress test 08/18/17 - Inferior scar, no ischemia, intermediate risk // med management unless +angina or worse dyspnea  . OA (osteoarthritis)   . Pacemaker 02/08/2014   followed by dr g. taylor--  single chamber Biotronik due to SSS  . Permanent atrial fibrillation ([place])   . Pneumonia 2019   Left lung  . Prostate cancer [facility]) urologist-  dr Diona Fanti   dx 2004--  Gleason 8, PSA 10.45--  11-28-2002  s/p  radical prostatectomy;  recurrent w/ increasing PSA, started ADT treatment  . RBBB (right bundle branch block)   . Sick sinus syndrome ([place])    a-Flutter with episodes of bradycardia; S/P Biotronik (serial number [id number]) 02-08-2014  . Urinary incontinence   . Wears hearing aid in right ear    receiver and transmitter    Past Surgical History:  Procedure Laterality Date  . APPENDECTOMY    . BACK SURGERY     disk  . CARDIAC CATHETERIZATION  09-03-1999  dr Cathie Olden   abnormal cardiolite study:  minor luminal irregularities but no critial coronary artery  stenosis  . CARDIOVASCULAR STRESS TEST  08-18-2017  dr Cathie Olden   Intermediate risk nuclear study w/ large area inferior infarction, no evidence of ishcemia (consistant w/ prior MI)/  study not gated due to frequent PVCs  . CARPAL TUNNEL RELEASE Right 2000  . CARPAL TUNNEL RELEASE Left 11/19/2009  . CATARACT EXTRACTION Right 07/2015  . CATARACT EXTRACTION Left 09/2015  . CYSTOSCOPY N/A 11/04/2017   Procedure: CYSTOSCOPY AND CAUTERIZATION OF BLADDER;  Surgeon: Franchot Gallo, MD;  Location: [facility];  Service: Urology;  Laterality: N/A;  . INSERTION PENILE PROSTHESIS  02-22-2004    dr Mattie Marlin  [facility]  . IR THORACENTESIS ASP PLEURAL SPACE W/IMG GUIDE  11/26/2017  . KNEE ARTHROSCOPY  Left 07/2010  . PERMANENT PACEMAKER INSERTION N/A 02/08/2014   Procedure: PERMANENT PACEMAKER INSERTION;  Surgeon: Evans Lance, MD;  Location: [facility] CATH LAB;  Service: Cardiovascular;  Laterality: N/A;  . PLEURAL EFFUSION DRAINAGE Left 11/30/2017   Procedure: DRAINAGE OF HEMOTHORAX;  Surgeon: Ivin Poot, MD;  Location: [facility];  Service: Thoracic;  Laterality: Left;  . RADICAL RETROPUBIC PROSTATECTOMY W/ BILATERAL PELVIC LYMPH NODE DISSECTION  11-28-2002   dr Mattie Marlin  [facility]  . TONSILLECTOMY    . TOTAL HIP ARTHROPLASTY Left 05/12/2016   Procedure: LEFT TOTAL HIP ARTHROPLASTY ANTERIOR APPROACH;  Surgeon: Paralee Cancel, MD;  Location: [facility];  Service: Orthopedics;  Laterality: Left;  . TOTAL HIP ARTHROPLASTY Right 07-15-2006   dr Alvan Dame  [facility]  . TRANSTHORACIC ECHOCARDIOGRAM  07/20/2017   mild LVH, ef 45-50%, hypokinesis of the basal-midinferior myocardium, due to AFib unable to evaluate diastolic function/  severe LAE and RAE/  trivial PR and TR  . VIDEO ASSISTED THORACOSCOPY Left 11/30/2017   Procedure: VIDEO ASSISTED THORACOSCOPY;  Surgeon: Ivin Poot, MD;  Location: [facility];  Service: Thoracic;  Laterality: Left;  [name] VIDEO ASSISTED THORACOSCOPY (VATS)/EMPYEMA Left 11/29/2017   Procedure: VIDEO ASSISTED THORACOSCOPY (VATS)/EMPYEMA;  Surgeon: Ivin Poot, MD;  Location: [facility] OR;  Service: Thoracic;  Laterality: Left;    Vitals:   01/26/18 1323  BP: 121/67  Pulse: 75    Subjective Assessment - 01/26/18 1324    Subjective  Pt stated he is ready to get on a cane; get working; he came in using the walker;      Pertinent History  Left THA 05/12/16, right THA 2008, CHF, MI, DM2, HTN, OA, prostate CA, shingles, A-fib, hearing loss, knee sg, recent thorascopy.  05/02/2017- bleeding and began with urinary incontinence    Limitations  Lifting;Standing;House hold activities;Walking    Patient Stated Goals  work on balance, "be able to walk, get off the walker (I'll live with a cane)".  Patient  has used the walker since January when he fell.    Currently in Pain?  No/denies                       [facility] Adult PT Treatment/Exercise - 01/26/18 0001      Transfers   Sit to Stand  5: Supervision    Sit to Stand Details  Verbal cues for sequencing;Verbal cues for technique    Stand to Sit Details  15 reps  no UE support focus on stnad to sit control       Ambulation/Gait   Ambulation/Gait  Yes    Ambulation/Gait Assistance  5: Supervision    Ambulation Distance (Feet)  200 Feet    Assistive device  Rollator also with only  gait belt and cga x 115 '     Ambulation Surface  Indoor;Level      High Level Balance   High Level Balance Activities  Other (comment) in bars; standing on wedge cushion /rocker board step ups ;       Neuro re-ed;  Use of FSST test set up ; multi bouts of step reflex training with gait belt ; had several loss of balance; also assessed use of appropriate assisted device; he needs to remain on walker       PT Education - 01/26/18 1629    Education provided  Yes    Education Details  use of rollator; with progressively less UE support; educatd to cont with walker vs the cane  due to balance deficits     Person(s) Educated  Patient    Methods  Explanation;Demonstration    Comprehension  Verbalized understanding;Returned demonstration       PT Short Term Goals - 01/18/18 2136      PT SHORT TERM GOAL #1   Title  The patient will return demo HEP for gaze adaptation, hip flexor strengthening, balance and general mobility.  (STG goals due 02/17/18)    Time  4    Period  Weeks    Target Date  02/17/18      PT SHORT TERM GOAL #2   Title  The patient will improve Berg from 28/56 to > or equal to 34/56 to demonstrate improving steady state balance without UE support.    Time  4    Period  Weeks    Target Date  02/17/18      PT SHORT TERM GOAL #3   Title  The patient will improve TUG with RW from 17.47 seconds to < or equal to 14 seconds to  demo improving functional mobility and dec'ing risk for falls.    Time  4    Period  Weeks    Target Date  02/17/18      PT SHORT TERM GOAL #4   Title  The patient will perform sit<>stand without UE support to demo improved functional strength in LEs.    Time  4    Period  Weeks    Target Date  02/17/18      PT SHORT TERM GOAL #5   Title  The patient will improve gait speed from 2.31 ft/sec to > or equal to 2.62 ft/sec to transition to "full community ambulator" classification of gait.    Time  4    Period  Weeks    Target Date  02/17/18        PT Long Term Goals - 01/18/18 2140      PT LONG TERM GOAL #1   Title  The patient will return demo progression of HEP and ongoing wellness plan.  (LTGs due date: 03/19/18)    Time  8    Period  Weeks    Target Date  03/19/18      PT LONG TERM GOAL #2   Title  The patient will improve Berg score from 28/56 to > or equal to 40/56 to demo dec'ing risk for falls.    Time  8    Period  Weeks    Target Date  03/19/18      PT LONG TERM GOAL #3   Title  The patient will perform 5 time sit<>stand without UE support in < or equal to 14 seconds.    Time  8  Period  Weeks    Target Date  03/19/18      PT LONG TERM GOAL #4   Title  The patient will ambulate short community distances with least restrictive device mod indep (x 500 ft).    Time  8    Period  Weeks    Target Date  03/19/18      PT LONG TERM GOAL #5   Title  The patient will negotiate household surfaces with SPC (if appropriate as he progresses) mod indep x 300 ft.    Time  8    Period  Weeks    Target Date  03/19/18            Plan - 01/26/18 1630    Clinical Impression Statement  Assessed his step reflex with  FSST set up and multi bouts of the stepping pattern; very narrow cone of stabilty and poor step reflex; although present ; his hip strategy is limted to small excursions outside base of support; queston his sensation in LE due to DM ; he cont to seek a wide  base of support    History and Personal Factors relevant to plan of care:  left THA 04/2016; right THA  2008;  CHF, MI, DM2 , HTN, OA prostate CA, shingles, a-fib ,right hearing loss , knee surgery, falls , pneumonia     Clinical Presentation  Evolving    Clinical Presentation due to:  gen weakness; recent pneumonia; poor equilbruim     Rehab Potential  Good    PT Frequency  2x / week    PT Duration  8 weeks    PT Treatment/Interventions  ADLs/Self Care Home Management;DME Instruction;Gait training;Stair training;Functional mobility training;Therapeutic exercise;Therapeutic activities;Balance training;Neuromuscular re-education;Patient/family education;Vestibular;Canalith Repostioning    PT Next Visit Plan  Ask about HEP questions.  Check gaze x 1 standing with UE support, Dynamic balance activities (compliant surfaces, head motion, turns, etc).  Gait with RW, transitional movements.      Consulted and Agree with Plan of Care  Patient       Patient will benefit from skilled therapeutic intervention in order to improve the following deficits and impairments:  Abnormal gait, Decreased activity tolerance, Decreased balance, Decreased endurance, Decreased coordination, Decreased knowledge of use of DME, Decreased mobility, Decreased strength, Postural dysfunction  Visit Diagnosis: Muscle weakness (generalized)  Unsteadiness on feet  Other abnormalities of gait and mobility  Repeated falls     Problem List Patient Active Problem List   Diagnosis Date Noted  . Encounter for therapeutic drug monitoring 12/20/2017  . S/P thoracotomy 11/29/2017  . Sepsis ([place])   . Hypoxemia   . Pneumonia of left lower lobe due to infectious organism ([place])   . Mediastinal lymphadenopathy   . Chronic cough 11/23/2017  . CLL (chronic lymphocytic leukemia) ([place]) 11/23/2017  . Pleural effusion 11/23/2017  . HCAP (healthcare-associated pneumonia) 11/20/2017  . Anemia 11/20/2017  . Ischemic cardiomyopathy  09/01/2017  . Right hip pain   . Acute otitis media   . Chronic systolic CHF (congestive heart failure) ([place])   . Pressure injury of skin 07/20/2017  . Malignant otitis externa 07/19/2017  . Fall at home, initial encounter 07/19/2017  . S/P left THA, AA 05/12/2016  . LPRD (laryngopharyngeal reflux disease) 05/21/2015  . Other allergic rhinitis 05/21/2015  . Obesity 12/12/2014  . Upper airway cough syndrome 12/11/2014  . Cough variant asthma 07/27/2014  . Wheezing 06/20/2014  . Symptomatic bradycardia - s/p Biotronik (serial number [id number]) 02/08/2014  .  Diabetes mellitus type 2, noninsulin dependent ([place])   . Edema of foot 03/06/2013  . Traumatic ecchymosis of right foot 03/06/2013  . Bone spur 12/30/2012  . Pain of toe of left foot 12/30/2012  . Hyperlipidemia 07/13/2012  . HTN (hypertension) 04/12/2012  . Sick sinus syndrome ([place]) 08/17/2011  . PAF (paroxysmal atrial fibrillation) ([place]) 09/16/2010    Rosaura Carpenter D PT DPT  01/26/2018, 4:36 PM  [facility] [address] [place], [name], [address] Phone: [phone]   Fax:  [phone]  Name: EMMITT MATTHEWS MRN: [id number] Date of Birth: Oct 19, 1932

## 2018-01-27 LAB — BASIC METABOLIC PANEL
BUN/Creatinine Ratio: 23 (ref 10–24)
BUN: 21 mg/dL (ref 8–27)
CHLORIDE: 103 mmol/L (ref 96–106)
CO2: 23 mmol/L (ref 20–29)
Calcium: 9.2 mg/dL (ref 8.6–10.2)
Creatinine, Ser: 0.91 mg/dL (ref 0.76–1.27)
GFR calc non Af Amer: 78 mL/min/{1.73_m2} (ref >59)
GFR, EST AFRICAN AMERICAN: 90 mL/min/{1.73_m2} (ref >59)
Glucose: 138 mg/dL — ABNORMAL HIGH (ref 65–99)
Potassium: 4.7 mmol/L (ref 3.5–5.2)
Sodium: 140 mmol/L (ref 134–144)

## 2018-01-31 ENCOUNTER — Telehealth: Payer: Self-pay

## 2018-01-31 NOTE — Telephone Encounter (Signed)
error 

## 2018-02-01 ENCOUNTER — Other Ambulatory Visit: Payer: Self-pay | Admitting: *Deleted

## 2018-02-01 MED ORDER — WARFARIN SODIUM 5 MG PO TABS: ORAL_TABLET | ORAL | 3 refills | Status: DC

## 2018-02-02 ENCOUNTER — Ambulatory Visit: Payer: Medicare Other

## 2018-02-02 DIAGNOSIS — M6281 Muscle weakness (generalized): Secondary | ICD-10-CM

## 2018-02-02 DIAGNOSIS — R2681 Unsteadiness on feet: Secondary | ICD-10-CM

## 2018-02-02 DIAGNOSIS — R2689 Other abnormalities of gait and mobility: Secondary | ICD-10-CM

## 2018-02-02 DIAGNOSIS — R296 Repeated falls: Secondary | ICD-10-CM | POA: Diagnosis not present

## 2018-02-02 NOTE — Therapy (Signed)
[facility] [address] [place], [name], [address] Phone: [phone]   Fax:  [phone]  Physical Therapy Treatment  Patient Details  Name: Oscar Baker MRN: [id number] Date of Birth: Dec 31, 1932 Referring Provider: unsteadiness on feet, muscle weakness   Encounter Date: 02/02/2018  PT End of Session - 02/02/18 1120    Visit Number  4    Number of Visits  17    Date for PT Re-Evaluation  03/19/18    Authorization Type  Medicare & BCBS supplement    Authorization Time Period  medicare guidelines with medical necessity; 100% coverage    PT Start Time  [phone]   pt in lobby but front didn't check in, PT brought pt back to gym and then pt checked in by front   PT Stop Time  1015    PT Time Calculation (min)  39 min    Equipment Utilized During Treatment  --   min guard to S prn   Activity Tolerance  Patient tolerated treatment well    Behavior During Therapy  [facility] for tasks assessed/performed       Past Medical History:  Diagnosis Date  . Asthma 1950's   history of  . Atrial fibrillation ([place])   . Bilateral carpal tunnel syndrome   . Bilateral lower extremity edema   . Bladder tumor   . Chronic systolic heart failure (HCC)    Echo 1/19: Mild LVH, EF 45-50, inf HK, MAC, severe LAE, severe RAE // Echo 7/15: Mild LVH, mod focal basal sept hypertrophy, EF 55-60, AV peak and mean 16/9, trivial MR, mod LAE, PASP 38  . CLL (chronic lymphocytic leukemia) [facility]) oncologist-  dr Ilene Qua--   dx [phone] ;  Lymphocytosis, CLL - per lov note 05-11-2017 currently under active survillance,  CT 04-17-2014 show very small lymphadenopathy, no indication for treatment  . Coronary artery disease    cardiologist-  dr Cathie Olden--  08-18-2017 Intermittant risk nuclear study w/ large area of inferior infartion with no evidence ishcemia   . 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 Diabetes mellitus type 2, noninsulin dependent ([place])   . Elevated PSA    since prostatectomy but now resolved  . Hematuria 04/2017  . History of ear infection    Right  . History of MI (myocardial infarction)    per myoview nuclear study 08-18-2017 , unknown when  . History of shingles 08/2017   L ear and scalp, possible  . Hyperlipidemia   . Hypertension   . Ischemic cardiomyopathy 09/01/2017   Presumed +CAD with Nuclear stress test 08/18/17 - Inferior scar, no ischemia, intermediate risk // med management unless +angina or worse dyspnea  . OA (osteoarthritis)   . Pacemaker 02/08/2014   followed by dr g. taylor--  single chamber Biotronik due to SSS  . Permanent atrial fibrillation ([place])   . Pneumonia 2019   Left lung  . Prostate cancer [facility]) urologist-  dr Diona Fanti   dx 2004--  Gleason 8, PSA 10.45--  11-28-2002  s/p  radical prostatectomy;  recurrent w/ increasing PSA, started ADT treatment  . RBBB (right bundle branch block)   . Sick sinus syndrome ([place])    a-Flutter with episodes of bradycardia; S/P Biotronik (serial number [id number]) 02-08-2014  . Urinary incontinence   . Wears hearing aid in right ear    receiver and transmitter    Past Surgical History:  Procedure Laterality Date  . APPENDECTOMY    . BACK SURGERY  disk  . CARDIAC CATHETERIZATION  09-03-1999  dr Cathie Olden   abnormal cardiolite study:  minor luminal irregularities but no critial coronary artery stenosis  . CARDIOVASCULAR STRESS TEST  08-18-2017  dr Cathie Olden   Intermediate risk nuclear study w/ large area inferior infarction, no evidence of ishcemia (consistant w/ prior MI)/  study not gated due to frequent PVCs  . CARPAL TUNNEL RELEASE Right 2000  . CARPAL TUNNEL RELEASE Left 11/19/2009  . CATARACT EXTRACTION Right 07/2015  . CATARACT EXTRACTION Left 09/2015  . CYSTOSCOPY N/A 11/04/2017   Procedure: CYSTOSCOPY AND CAUTERIZATION OF BLADDER;  Surgeon: Franchot Gallo, MD;  Location: [facility];  Service: Urology;  Laterality: N/A;  . INSERTION PENILE PROSTHESIS   02-22-2004    dr Mattie Marlin  [facility]  . IR THORACENTESIS ASP PLEURAL SPACE W/IMG GUIDE  11/26/2017  . KNEE ARTHROSCOPY Left 07/2010  . PERMANENT PACEMAKER INSERTION N/A 02/08/2014   Procedure: PERMANENT PACEMAKER INSERTION;  Surgeon: Evans Lance, MD;  Location: [facility] CATH LAB;  Service: Cardiovascular;  Laterality: N/A;  . PLEURAL EFFUSION DRAINAGE Left 11/30/2017   Procedure: DRAINAGE OF HEMOTHORAX;  Surgeon: Ivin Poot, MD;  Location: [facility];  Service: Thoracic;  Laterality: Left;  . RADICAL RETROPUBIC PROSTATECTOMY W/ BILATERAL PELVIC LYMPH NODE DISSECTION  11-28-2002   dr Mattie Marlin  [facility]  . TONSILLECTOMY    . TOTAL HIP ARTHROPLASTY Left 05/12/2016   Procedure: LEFT TOTAL HIP ARTHROPLASTY ANTERIOR APPROACH;  Surgeon: Paralee Cancel, MD;  Location: [facility];  Service: Orthopedics;  Laterality: Left;  . TOTAL HIP ARTHROPLASTY Right 07-15-2006   dr Alvan Dame  [facility]  . TRANSTHORACIC ECHOCARDIOGRAM  07/20/2017   mild LVH, ef 45-50%, hypokinesis of the basal-midinferior myocardium, due to AFib unable to evaluate diastolic function/  severe LAE and RAE/  trivial PR and TR  . VIDEO ASSISTED THORACOSCOPY Left 11/30/2017   Procedure: VIDEO ASSISTED THORACOSCOPY;  Surgeon: Ivin Poot, MD;  Location: [facility];  Service: Thoracic;  Laterality: Left;  [name] VIDEO ASSISTED THORACOSCOPY (VATS)/EMPYEMA Left 11/29/2017   Procedure: VIDEO ASSISTED THORACOSCOPY (VATS)/EMPYEMA;  Surgeon: Ivin Poot, MD;  Location: [facility];  Service: Thoracic;  Laterality: Left;    There were no vitals filed for this visit.  Subjective Assessment - 02/02/18 0939    Subjective  Pt denied falls or changes since last visit. Pt then reported he's having cataracts removed 02/03/18 and 02/17/18, which will help his vision.     Pertinent History  Left THA 05/12/16, right THA 2008, CHF, MI, DM2, HTN, OA, prostate CA, shingles, A-fib, hearing loss, knee sg, recent thorascopy.  05/02/2017- bleeding and began with urinary incontinence    Patient Stated  Goals  work on balance, "be able to walk, get off the walker (I'll live with a cane)".  Patient has used the walker since January when he fell.    Currently in Pain?  No/denies          Therex: Performed in standing and seated positions with intermittent UE support. Pt tolerated well and no c/o pain. Cues and demo for technique. Please see pt instructions for HEP details.    Neuro re-ed: Performed standing with feet apart/together, eyes open and closed with cues for technique and UE support for safety as needed. Please see pt instructions for details.          [facility] Adult PT Treatment/Exercise - 02/02/18 1009      Exercises   Exercises  Knee/Hip  Knee/Hip Exercises: Aerobic   Other Aerobic  SciFit to determine time to use recumbent bike at home: with BLEs only for 5 minutes, level 1. 0. Distance: 0.22  miles             PT Education - 02/02/18 1120    Education provided  Yes    Education Details  PT reviewed HEP, as pt was unsure which exercises he should be performing. PT modified/progressed and added to HEP as tolerated.     Person(s) Educated  Patient    Methods  Explanation;Demonstration;Verbal cues;Handout    Comprehension  Returned demonstration;Verbalized understanding       PT Short Term Goals - 01/18/18 2136      PT SHORT TERM GOAL #1   Title  The patient will return demo HEP for gaze adaptation, hip flexor strengthening, balance and general mobility.  (STG goals due 02/17/18)    Time  4    Period  Weeks    Target Date  02/17/18      PT SHORT TERM GOAL #2   Title  The patient will improve Berg from 28/56 to > or equal to 34/56 to demonstrate improving steady state balance without UE support.    Time  4    Period  Weeks    Target Date  02/17/18      PT SHORT TERM GOAL #3   Title  The patient will improve TUG with RW from 17.47 seconds to < or equal to 14 seconds to demo improving functional mobility and dec'ing risk for falls.    Time  4     Period  Weeks    Target Date  02/17/18      PT SHORT TERM GOAL #4   Title  The patient will perform sit<>stand without UE support to demo improved functional strength in LEs.    Time  4    Period  Weeks    Target Date  02/17/18      PT SHORT TERM GOAL #5   Title  The patient will improve gait speed from 2.31 ft/sec to > or equal to 2.62 ft/sec to transition to "full community ambulator" classification of gait.    Time  4    Period  Weeks    Target Date  02/17/18        PT Long Term Goals - 01/18/18 2140      PT LONG TERM GOAL #1   Title  The patient will return demo progression of HEP and ongoing wellness plan.  (LTGs due date: 03/19/18)    Time  8    Period  Weeks    Target Date  03/19/18      PT LONG TERM GOAL #2   Title  The patient will improve Berg score from 28/56 to > or equal to 40/56 to demo dec'ing risk for falls.    Time  8    Period  Weeks    Target Date  03/19/18      PT LONG TERM GOAL #3   Title  The patient will perform 5 time sit<>stand without UE support in < or equal to 14 seconds.    Time  8    Period  Weeks    Target Date  03/19/18      PT LONG TERM GOAL #4   Title  The patient will ambulate short community distances with least restrictive device mod indep (x 500 ft).    Time  8  Period  Weeks    Target Date  03/19/18      PT LONG TERM GOAL #5   Title  The patient will negotiate household surfaces with SPC (if appropriate as he progresses) mod indep x 300 ft.    Time  8    Period  Weeks    Target Date  03/19/18            Plan - 02/02/18 1132    Clinical Impression Statement  Today's skilled session focused on reviewing HEP, as pt reported some are too easy and he's not sure which exercises are most important.  PT also had pt perform SciFit to establish a starting point for using recumbent bike at home. Pt continues to require intermittent UE support to maintain balance during therex and balance activities. Pt required cues for technique  and education on why each selected activity is important for balance/strength/endurance. Pt would continue to benefit from skilled PT to improve safety during functional mobility.     Rehab Potential  Good    PT Frequency  2x / week    PT Duration  8 weeks    PT Treatment/Interventions  ADLs/Self Care Home Management;DME Instruction;Gait training;Stair training;Functional mobility training;Therapeutic exercise;Therapeutic activities;Balance training;Neuromuscular re-education;Patient/family education;Vestibular;Canalith Repostioning    PT Next Visit Plan  TRIAL GAIT WITH SPC.Check gaze x 1 standing with UE support (not performing until after cataract procedure), Dynamic balance activities (compliant surfaces, head motion, turns, etc).  Gait with RW, transitional movements.      Consulted and Agree with Plan of Care  Patient       Patient will benefit from skilled therapeutic intervention in order to improve the following deficits and impairments:  Abnormal gait, Decreased activity tolerance, Decreased balance, Decreased endurance, Decreased coordination, Decreased knowledge of use of DME, Decreased mobility, Decreased strength, Postural dysfunction  Visit Diagnosis: Muscle weakness (generalized)  Unsteadiness on feet  Other abnormalities of gait and mobility     Problem List Patient Active Problem List   Diagnosis Date Noted  . Encounter for therapeutic drug monitoring 12/20/2017  . S/P thoracotomy 11/29/2017  . Sepsis ([place])   . Hypoxemia   . Pneumonia of left lower lobe due to infectious organism ([place])   . Mediastinal lymphadenopathy   . Chronic cough 11/23/2017  . CLL (chronic lymphocytic leukemia) ([place]) 11/23/2017  . Pleural effusion 11/23/2017  . HCAP (healthcare-associated pneumonia) 11/20/2017  . Anemia 11/20/2017  . Ischemic cardiomyopathy 09/01/2017  . Right hip pain   . Acute otitis media   . Chronic systolic CHF (congestive heart failure) ([place])   . Pressure injury of  skin 07/20/2017  . Malignant otitis externa 07/19/2017  . Fall at home, initial encounter 07/19/2017  . S/P left THA, AA 05/12/2016  . LPRD (laryngopharyngeal reflux disease) 05/21/2015  . Other allergic rhinitis 05/21/2015  . Obesity 12/12/2014  . Upper airway cough syndrome 12/11/2014  . Cough variant asthma 07/27/2014  . Wheezing 06/20/2014  . Symptomatic bradycardia - s/p Biotronik (serial number [id number]) 02/08/2014  . Diabetes mellitus type 2, noninsulin dependent ([place])   . Edema of foot 03/06/2013  . Traumatic ecchymosis of right foot 03/06/2013  . Bone spur 12/30/2012  . Pain of toe of left foot 12/30/2012  . Hyperlipidemia 07/13/2012  . HTN (hypertension) 04/12/2012  . Sick sinus syndrome ([place]) 08/17/2011  . PAF (paroxysmal atrial fibrillation) ([place]) 09/16/2010    Delair,Tasheika Kitzmiller L 02/02/2018, 11:36 AM  [facility] [address] [place],  [name], [address] Phone: [phone]   Fax:  [phone]  Name: GUTHRIE LEMME MRN: [id number] Date of Birth: [birth year]  Supreme Rybarczyk, PT,DPT 02/02/18 11:38 AM Phone: [phone] Fax: [phone]

## 2018-02-02 NOTE — Patient Instructions (Addendum)
Perform these on Monday/Friday's for strengthening: Sit to Stand / Stand to Sit / Transfers    Sit on edge of a solid chair with arms, feet flat on floor. Lean forward over feet and stand up with hands on chair arms. Sit down slowly with hands on chair arms. TRY FROM A LOWER SURFACE OR A SOFTER SURFACE.  Repeat _10_ times per session. Do _1_ sessions per day.  Copyright  VHI. All rights reserved.  Perform these at a counter for balance assistance as needed:  Toe / Heel Raise (Standing)    Standing with support, raise heels up and hold for 5 seconds, then raise toes up for 5 seconds. HOLD ON WITH ONE HAND ONLY.  Repeat _10_ times. 1 time a day  Copyright  VHI. All rights reserved.    Standing Hip Abduction  While standing upright, place YELLOW band around both ankles and kick leg out to the side while keeping toes straight on both feet. Avoid leaning to the side or hiking hip up to gain more motion. Repeat with other leg, alternating legs.   10 reps each leg. 1 time a day  PERFORM IN A CORNER WITH CHAIR IN FRONT OF YOU FOR SAFETY: Feet Together, Varied Arm Positions - Eyes Closed    Stand with feet together and arms AT YOUR SIDE. Close eyes and visualize upright position. Hold __10-30__ seconds. Repeat _3___ times per session. Do __1__ sessions per day.  Copyright  VHI. All rights reserved.

## 2018-02-03 DIAGNOSIS — H26492 Other secondary cataract, left eye: Secondary | ICD-10-CM | POA: Diagnosis not present

## 2018-02-03 LAB — CUP PACEART REMOTE DEVICE CHECK
Date Time Interrogation Session: 20190815110554
MDC IDC LEAD IMPLANT DT: 20150820
MDC IDC LEAD LOCATION: 753860
MDC IDC LEAD SERIAL: 29625633
MDC IDC PG IMPLANT DT: 20150820
MDC IDC PG SERIAL: 68372291
Pulse Gen Model: 394934

## 2018-02-04 ENCOUNTER — Ambulatory Visit: Payer: Medicare Other

## 2018-02-04 ENCOUNTER — Encounter

## 2018-02-04 DIAGNOSIS — M6281 Muscle weakness (generalized): Secondary | ICD-10-CM

## 2018-02-04 DIAGNOSIS — R2689 Other abnormalities of gait and mobility: Secondary | ICD-10-CM | POA: Diagnosis not present

## 2018-02-04 DIAGNOSIS — R2681 Unsteadiness on feet: Secondary | ICD-10-CM

## 2018-02-04 DIAGNOSIS — R296 Repeated falls: Secondary | ICD-10-CM | POA: Diagnosis not present

## 2018-02-04 NOTE — Therapy (Signed)
[facility] [address] [place], [name], [address] Phone: [phone]   Fax:  [phone]  Physical Therapy Treatment  Patient Details  Name: Oscar Baker MRN: [id number] Date of Birth: 1932/09/03 Referring Provider: unsteadiness on feet, muscle weakness   Encounter Date: 02/04/2018  PT End of Session - 02/04/18 1014    Visit Number  5    Number of Visits  17    Date for PT Re-Evaluation  03/19/18    Authorization Type  Medicare & BCBS supplement    Authorization Time Period  medicare guidelines with medical necessity; 100% coverage    PT Start Time  0931    PT Stop Time  1014    PT Time Calculation (min)  43 min    Equipment Utilized During Treatment  Gait belt    Activity Tolerance  Patient tolerated treatment well    Behavior During Therapy  [facility] for tasks assessed/performed       Past Medical History:  Diagnosis Date  . Asthma 1950's   history of  . Atrial fibrillation ([place])   . Bilateral carpal tunnel syndrome   . Bilateral lower extremity edema   . Bladder tumor   . Chronic systolic heart failure (HCC)    Echo 1/19: Mild LVH, EF 45-50, inf HK, MAC, severe LAE, severe RAE // Echo 7/15: Mild LVH, mod focal basal sept hypertrophy, EF 55-60, AV peak and mean 16/9, trivial MR, mod LAE, PASP 38  . CLL (chronic lymphocytic leukemia) [facility]) oncologist-  dr Ilene Qua--   dx [phone] ;  Lymphocytosis, CLL - per lov note 05-11-2017 currently under active survillance,  CT 04-17-2014 show very small lymphadenopathy, no indication for treatment  . Coronary artery disease    cardiologist-  dr Cathie Olden--  08-18-2017 Intermittant risk nuclear study w/ large area of inferior infartion with no evidence ishcemia   . 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 Diabetes mellitus type 2, noninsulin dependent ([place])   . Elevated PSA    since prostatectomy but now resolved  . Hematuria 04/2017  . History of ear infection    Right  . History of MI  (myocardial infarction)    per myoview nuclear study 08-18-2017 , unknown when  . History of shingles 08/2017   L ear and scalp, possible  . Hyperlipidemia   . Hypertension   . Ischemic cardiomyopathy 09/01/2017   Presumed +CAD with Nuclear stress test 08/18/17 - Inferior scar, no ischemia, intermediate risk // med management unless +angina or worse dyspnea  . OA (osteoarthritis)   . Pacemaker 02/08/2014   followed by dr g. taylor--  single chamber Biotronik due to SSS  . Permanent atrial fibrillation ([place])   . Pneumonia 2019   Left lung  . Prostate cancer [facility]) urologist-  dr Diona Fanti   dx 2004--  Gleason 8, PSA 10.45--  11-28-2002  s/p  radical prostatectomy;  recurrent w/ increasing PSA, started ADT treatment  . RBBB (right bundle branch block)   . Sick sinus syndrome ([place])    a-Flutter with episodes of bradycardia; S/P Biotronik (serial number [id number]) 02-08-2014  . Urinary incontinence   . Wears hearing aid in right ear    receiver and transmitter    Past Surgical History:  Procedure Laterality Date  . APPENDECTOMY    . BACK SURGERY     disk  . CARDIAC CATHETERIZATION  09-03-1999  dr Cathie Olden   abnormal cardiolite study:  minor luminal irregularities but no critial coronary artery  stenosis  . CARDIOVASCULAR STRESS TEST  08-18-2017  dr Cathie Olden   Intermediate risk nuclear study w/ large area inferior infarction, no evidence of ishcemia (consistant w/ prior MI)/  study not gated due to frequent PVCs  . CARPAL TUNNEL RELEASE Right 2000  . CARPAL TUNNEL RELEASE Left 11/19/2009  . CATARACT EXTRACTION Right 07/2015  . CATARACT EXTRACTION Left 09/2015  . CYSTOSCOPY N/A 11/04/2017   Procedure: CYSTOSCOPY AND CAUTERIZATION OF BLADDER;  Surgeon: Franchot Gallo, MD;  Location[facility];  Service: Urology;  Laterality: N/A;  . INSERTION PENILE PROSTHESIS  02-22-2004    dr Mattie Marlin  [facility]  . IR THORACENTESIS ASP PLEURAL SPACE W/IMG GUIDE  11/26/2017  . KNEE ARTHROSCOPY  Left 07/2010  . PERMANENT PACEMAKER INSERTION N/A 02/08/2014   Procedure: PERMANENT PACEMAKER INSERTION;  Surgeon: Evans Lance, MD;  Location: [facility] CATH LAB;  Service: Cardiovascular;  Laterality: N/A;  . PLEURAL EFFUSION DRAINAGE Left 11/30/2017   Procedure: DRAINAGE OF HEMOTHORAX;  Surgeon: Ivin Poot, MD;  Location: [facility];  Service: Thoracic;  Laterality: Left;  . RADICAL RETROPUBIC PROSTATECTOMY W/ BILATERAL PELVIC LYMPH NODE DISSECTION  11-28-2002   dr Mattie Marlin  [facility]  . TONSILLECTOMY    . TOTAL HIP ARTHROPLASTY Left 05/12/2016   Procedure: LEFT TOTAL HIP ARTHROPLASTY ANTERIOR APPROACH;  Surgeon: Paralee Cancel, MD;  Location: [facility];  Service: Orthopedics;  Laterality: Left;  . TOTAL HIP ARTHROPLASTY Right 07-15-2006   dr Alvan Dame  [facility]  . TRANSTHORACIC ECHOCARDIOGRAM  07/20/2017   mild LVH, ef 45-50%, hypokinesis of the basal-midinferior myocardium, due to AFib unable to evaluate diastolic function/  severe LAE and RAE/  trivial PR and TR  . VIDEO ASSISTED THORACOSCOPY Left 11/30/2017   Procedure: VIDEO ASSISTED THORACOSCOPY;  Surgeon: Ivin Poot, MD;  Location: [facility];  Service: Thoracic;  Laterality: Left;  [name] VIDEO ASSISTED THORACOSCOPY (VATS)/EMPYEMA Left 11/29/2017   Procedure: VIDEO ASSISTED THORACOSCOPY (VATS)/EMPYEMA;  Surgeon: Ivin Poot, MD;  Location: [facility];  Service: Thoracic;  Laterality: Left;    There were no vitals filed for this visit.  Subjective Assessment - 02/04/18 0934    Subjective  Pt denied falls since last visit. He had L eye cataract (minor) procedure yesterday and L vision is still a bit fuzzy, and will be for a few days.     Pertinent History  Left THA 05/12/16, right THA 2008, CHF, MI, DM2, HTN, OA, prostate CA, shingles, A-fib, hearing loss, knee sg, recent thorascopy.  05/02/2017- bleeding and began with urinary incontinence    Patient Stated Goals  work on balance, "be able to walk, get off the walker (I'll live with a cane)".  Patient has used the  walker since January when he fell.    Currently in Pain?  No/denies                       [facility] Adult PT Treatment/Exercise - 02/04/18 0935      Ambulation/Gait   Ambulation/Gait  Yes    Ambulation/Gait Assistance  4: Min assist;4: Min guard;5: Supervision    Ambulation/Gait Assistance Details  Cues and demo for sequencing with SPC, min guard to min A with SPC. S with RW with cues over inclines/declines for weight shifting.     Ambulation Distance (Feet)  200 Feet   x4 and 230' with SPC, 500' with RW outdoors   Assistive device  Straight cane;Rolling walker    Gait Pattern  Step-through pattern;Decreased  arm swing - left;Decreased step length - left;Decreased stance time - right;Decreased stride length;Lateral hip instability;Trunk flexed;Wide base of support    Ambulation Surface  Level;Unlevel;Indoor;Outdoor;Paved             PT Education - 02/04/18 1014    Education provided  Yes    Education Details  Prov[place] pt [place] HE[place]. Educated pt on the importance of using RW at all times 2/2 LOB with SPC, and that we'll work with cane during PT sessions only for now.     Person(s) Educated  Patient    Methods  Explanation;Handout    Comprehension  Verbalized understanding       PT Short Term Goals - 01/18/18 2136      PT SHORT TERM GOAL #1   Title  The patient will return demo HEP for gaze adaptation, hip flexor strengthening, balance and general mobility.  (STG goals due 02/17/18)    Time  4    Period  Weeks    Target Date  02/17/18      PT SHORT TERM GOAL #2   Title  The patient will improve Berg from 28/56 to > or equal to 34/56 to demonstrate improving steady state balance without UE support.    Time  4    Period  Weeks    Target Date  02/17/18      PT SHORT TERM GOAL #3   Title  The patient will improve TUG with RW from 17.47 seconds to < or equal to 14 seconds to demo improving functional mobility and  dec'ing risk for falls.    Time  4    Period  Weeks    Target Date  02/17/18      PT SHORT TERM GOAL #4   Title  The patient will perform sit<>stand without UE support to demo improved functional strength in LEs.    Time  4    Period  Weeks    Target Date  02/17/18      PT SHORT TERM GOAL #5   Title  The patient will improve gait speed from 2.31 ft/sec to > or equal to 2.62 ft/sec to transition to "full community ambulator" classification of gait.    Time  4    Period  Weeks    Target Date  02/17/18        PT Long Term Goals - 01/18/18 2140      PT LONG TERM GOAL #1   Title  The patient will return demo progression of HEP and ongoing wellness plan.  (LTGs due date: 03/19/18)    Time  8    Period  Weeks    Target Date  03/19/18      PT LONG TERM GOAL #2   Title  The patient will improve Berg score from 28/56 to > or equal to 40/56 to demo dec'ing risk for falls.    Time  8    Period  Weeks    Target Date  03/19/18      PT LONG TERM GOAL #3   Title  The patient will perform 5 time sit<>stand without UE support in < or equal to 14 seconds.    Time  8    Period  Weeks    Target Date  03/19/18      PT LONG TERM GOAL #4   Title  The patient will ambulate short community distances with least restrictive device mod  indep (x 500 ft).    Time  8    Period  Weeks    Target Date  03/19/18      PT LONG TERM GOAL #5   Title  The patient will negotiate household surfaces with SPC (if appropriate as he progresses) mod indep x 300 ft.    Time  8    Period  Weeks    Target Date  03/19/18            Plan - 02/04/18 1015    Clinical Impression Statement  Pt trialed SPC today, and required min guard to min A to maintain balance. Therefore, PT educated pt on the importance of using only RW at all times for safety. Pt trialed gaze stabilization but reported eye sight is still "fuzzy" after L cataract procedure, will hold off until vision improves. Continue with POC.     Rehab  Potential  Good    PT Frequency  2x / week    PT Duration  8 weeks    PT Treatment/Interventions  ADLs/Self Care Home Management;DME Instruction;Gait training;Stair training;Functional mobility training;Therapeutic exercise;Therapeutic activities;Balance training;Neuromuscular re-education;Patient/family education;Vestibular;Canalith Repostioning    PT Next Visit Plan  GAIT WITH SPC only in PT session not a home.Check gaze x 1 standing with UE support (not performing until after cataract procedure), Dynamic balance activities (compliant surfaces, head motion, turns, etc).  Gait with RW, transitional movements.      Consulted and Agree with Plan of Care  Patient       Patient will benefit from skilled therapeutic intervention in order to improve the following deficits and impairments:  Abnormal gait, Decreased activity tolerance, Decreased balance, Decreased endurance, Decreased coordination, Decreased knowledge of use of DME, Decreased mobility, Decreased strength, Postural dysfunction  Visit Diagnosis: Other abnormalities of gait and mobility  Unsteadiness on feet  Muscle weakness (generalized)     Problem List Patient Active Problem List   Diagnosis Date Noted  . Encounter for therapeutic drug monitoring 12/20/2017  . S/P thoracotomy 11/29/2017  . Sepsis ([place])   . Hypoxemia   . Pneumonia of left lower lobe due to infectious organism ([place])   . Mediastinal lymphadenopathy   . Chronic cough 11/23/2017  . CLL (chronic lymphocytic leukemia) ([place]) 11/23/2017  . Pleural effusion 11/23/2017  . HCAP (healthcare-associated pneumonia) 11/20/2017  . Anemia 11/20/2017  . Ischemic cardiomyopathy 09/01/2017  . Right hip pain   . Acute otitis media   . Chronic systolic CHF (congestive heart failure) ([place])   . Pressure injury of skin 07/20/2017  . Malignant otitis externa 07/19/2017  . Fall at home, initial encounter 07/19/2017  . S/P left THA, AA 05/12/2016  . LPRD (laryngopharyngeal  reflux disease) 05/21/2015  . Other allergic rhinitis 05/21/2015  . Obesity 12/12/2014  . Upper airway cough syndrome 12/11/2014  . Cough variant asthma 07/27/2014  . Wheezing 06/20/2014  . Symptomatic bradycardia - s/p Biotronik (serial number [id number]) 02/08/2014  . Diabetes mellitus type 2, noninsulin dependent ([place])   . Edema of foot 03/06/2013  . Traumatic ecchymosis of right foot 03/06/2013  . Bone spur 12/30/2012  . Pain of toe of left foot 12/30/2012  . Hyperlipidemia 07/13/2012  . HTN (hypertension) 04/12/2012  . Sick sinus syndrome ([place]) 08/17/2011  . PAF (paroxysmal atrial fibrillation) ([place]) 09/16/2010    Votta,Shirleymae Hauth L 02/04/2018, 10:17 AM  [facility] [address] [place], [name], [address] Phone: [phone]   Fax:  [phone]  Name: Lashun Mccants  [place] MRN: [id number] Date of Birth: January 11, 1933  Beren Yniguez, PT,DPT 02/04/18 10:17 AM Phone: [phone] Fax: [phone]

## 2018-02-04 NOTE — Patient Instructions (Signed)
Perform these on Monday/Friday's for strengthening: Sit to Stand / Stand to Sit / Transfers    Sit on edge of a solid chair with arms, feet flat on floor. Lean forward over feet and stand up with hands on chair arms. Sit down slowly with hands on chair arms. TRY FROM A LOWER SURFACE OR A SOFTER SURFACE.  Repeat _10_ times per session. Do _1_ sessions per day.  Copyright  VHI. All rights reserved.  Perform these at a counter for balance assistance as needed:  Toe / Heel Raise (Standing)    Standing with support, raise heels up and hold for 5 seconds, then raise toes up for 5 seconds. HOLD ON WITH ONE HAND ONLY.  Repeat _10_ times. 1 time a day  Copyright  VHI. All rights reserved.    Standing Hip Abduction  While standing upright, place YELLOW band around both ankles and kick leg out to the side while keeping toes straight on both feet. Avoid leaning to the side or hiking hip up to gain more motion. Repeat with other leg, alternating legs.   10 reps each leg. 1 time a day  PERFORM IN A CORNER WITH CHAIR IN FRONT OF YOU FOR SAFETY: Feet Together, Varied Arm Positions - Eyes Closed    Stand with feet together and arms AT YOUR SIDE. Close eyes and visualize upright position. Hold __10-30__ seconds. Repeat _3___ times per session. Do __1__ sessions per day.  Copyright  VHI. All rights reserved.      Instructions

## 2018-02-07 ENCOUNTER — Encounter: Payer: Self-pay | Admitting: Physical Therapy

## 2018-02-07 ENCOUNTER — Ambulatory Visit: Payer: Medicare Other | Admitting: Physical Therapy

## 2018-02-07 DIAGNOSIS — R296 Repeated falls: Secondary | ICD-10-CM | POA: Diagnosis not present

## 2018-02-07 DIAGNOSIS — R2689 Other abnormalities of gait and mobility: Secondary | ICD-10-CM

## 2018-02-07 DIAGNOSIS — M6281 Muscle weakness (generalized): Secondary | ICD-10-CM

## 2018-02-07 DIAGNOSIS — R2681 Unsteadiness on feet: Secondary | ICD-10-CM | POA: Diagnosis not present

## 2018-02-07 NOTE — Therapy (Signed)
[facility] [address] [place], [name], [address] Phone: [phone]   Fax:  [phone]  Physical Therapy Treatment  Patient Details  Name: Oscar Baker MRN: [id number] Date of Birth: February 05, 1933 Referring Provider: unsteadiness on feet, muscle weakness   Encounter Date: 02/07/2018  PT End of Session - 02/07/18 0935    Visit Number  6    Number of Visits  17    Date for PT Re-Evaluation  03/19/18    Authorization Type  Medicare & BCBS supplement    Authorization Time Period  medicare guidelines with medical necessity; 100% coverage    PT Start Time  0847    PT Stop Time  0928    PT Time Calculation (min)  41 min    Equipment Utilized During Treatment  Gait belt    Activity Tolerance  Patient tolerated treatment well    Behavior During Therapy  [facility] for tasks assessed/performed       Past Medical History:  Diagnosis Date  . Asthma 1950's   history of  . Atrial fibrillation ([place])   . Bilateral carpal tunnel syndrome   . Bilateral lower extremity edema   . Bladder tumor   . Chronic systolic heart failure (HCC)    Echo 1/19: Mild LVH, EF 45-50, inf HK, MAC, severe LAE, severe RAE // Echo 7/15: Mild LVH, mod focal basal sept hypertrophy, EF 55-60, AV peak and mean 16/9, trivial MR, mod LAE, PASP 38  . CLL (chronic lymphocytic leukemia) [facility]) oncologist-  dr Ilene Qua--   dx [phone] ;  Lymphocytosis, CLL - per lov note 05-11-2017 currently under active survillance,  CT 04-17-2014 show very small lymphadenopathy, no indication for treatment  . Coronary artery disease    cardiologist-  dr Cathie Olden--  08-18-2017 Intermittant risk nuclear study w/ large area of inferior infartion with no evidence ishcemia   . 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 Diabetes mellitus type 2, noninsulin dependent ([place])   . Elevated PSA    since prostatectomy but now resolved  . Hematuria 04/2017  . History of ear infection    Right  . History of MI  (myocardial infarction)    per myoview nuclear study 08-18-2017 , unknown when  . History of shingles 08/2017   L ear and scalp, possible  . Hyperlipidemia   . Hypertension   . Ischemic cardiomyopathy 09/01/2017   Presumed +CAD with Nuclear stress test 08/18/17 - Inferior scar, no ischemia, intermediate risk // med management unless +angina or worse dyspnea  . OA (osteoarthritis)   . Pacemaker 02/08/2014   followed by dr g. taylor--  single chamber Biotronik due to SSS  . Permanent atrial fibrillation ([place])   . Pneumonia 2019   Left lung  . Prostate cancer [facility]) urologist-  dr Diona Fanti   dx 2004--  Gleason 8, PSA 10.45--  11-28-2002  s/p  radical prostatectomy;  recurrent w/ increasing PSA, started ADT treatment  . RBBB (right bundle branch block)   . Sick sinus syndrome ([place])    a-Flutter with episodes of bradycardia; S/P Biotronik (serial number [id number]) 02-08-2014  . Urinary incontinence   . Wears hearing aid in right ear    receiver and transmitter    Past Surgical History:  Procedure Laterality Date  . APPENDECTOMY    . BACK SURGERY     disk  . CARDIAC CATHETERIZATION  09-03-1999  dr Cathie Olden   abnormal cardiolite study:  minor luminal irregularities but no critial coronary artery  stenosis  . CARDIOVASCULAR STRESS TEST  08-18-2017  dr Cathie Olden   Intermediate risk nuclear study w/ large area inferior infarction, no evidence of ishcemia (consistant w/ prior MI)/  study not gated due to frequent PVCs  . CARPAL TUNNEL RELEASE Right 2000  . CARPAL TUNNEL RELEASE Left 11/19/2009  . CATARACT EXTRACTION Right 07/2015  . CATARACT EXTRACTION Left 09/2015  . CYSTOSCOPY N/A 11/04/2017   Procedure: CYSTOSCOPY AND CAUTERIZATION OF BLADDER;  Surgeon: Franchot Gallo, MD;  Location: [facility];  Service: Urology;  Laterality: N/A;  . INSERTION PENILE PROSTHESIS  02-22-2004    dr Mattie Marlin  [facility]  . IR THORACENTESIS ASP PLEURAL SPACE W/IMG GUIDE  11/26/2017  . KNEE ARTHROSCOPY  Left 07/2010  . PERMANENT PACEMAKER INSERTION N/A 02/08/2014   Procedure: PERMANENT PACEMAKER INSERTION;  Surgeon: Evans Lance, MD;  Location: [facility] CATH LAB;  Service: Cardiovascular;  Laterality: N/A;  . PLEURAL EFFUSION DRAINAGE Left 11/30/2017   Procedure: DRAINAGE OF HEMOTHORAX;  Surgeon: Ivin Poot, MD;  Location: [facility];  Service: Thoracic;  Laterality: Left;  . RADICAL RETROPUBIC PROSTATECTOMY W/ BILATERAL PELVIC LYMPH NODE DISSECTION  11-28-2002   dr Mattie Marlin  [facility]  . TONSILLECTOMY    . TOTAL HIP ARTHROPLASTY Left 05/12/2016   Procedure: LEFT TOTAL HIP ARTHROPLASTY ANTERIOR APPROACH;  Surgeon: Paralee Cancel, MD;  Location: [facility];  Service: Orthopedics;  Laterality: Left;  . TOTAL HIP ARTHROPLASTY Right 07-15-2006   dr Alvan Dame  [facility]  . TRANSTHORACIC ECHOCARDIOGRAM  07/20/2017   mild LVH, ef 45-50%, hypokinesis of the basal-midinferior myocardium, due to AFib unable to evaluate diastolic function/  severe LAE and RAE/  trivial PR and TR  . VIDEO ASSISTED THORACOSCOPY Left 11/30/2017   Procedure: VIDEO ASSISTED THORACOSCOPY;  Surgeon: Ivin Poot, MD;  Location: [facility];  Service: Thoracic;  Laterality: Left;  [name] VIDEO ASSISTED THORACOSCOPY (VATS)/EMPYEMA Left 11/29/2017   Procedure: VIDEO ASSISTED THORACOSCOPY (VATS)/EMPYEMA;  Surgeon: Ivin Poot, MD;  Location: [facility];  Service: Thoracic;  Laterality: Left;    There were no vitals filed for this visit.  Subjective Assessment - 02/07/18 0850    Subjective  No changes no falls.     Pertinent History  Left THA 05/12/16, right THA 2008, CHF, MI, DM2, HTN, OA, prostate CA, shingles, A-fib, hearing loss, knee sg, recent thorascopy.  05/02/2017- bleeding and began with urinary incontinence    Patient Stated Goals  work on balance, "be able to walk, get off the walker (I'll live with a cane)".  Patient has used the walker since January when he fell.    Currently in Pain?  No/denies                       [facility] Adult PT  Treatment/Exercise - 02/07/18 0001      Transfers   Sit to Stand  5: Supervision;4: Min guard;From bed   UEs on knees   Sit to Stand Details (indicate cue type and reason)  imbalance as comes to stand (typically posterior)    Number of Reps  1 set;10 reps      Ambulation/Gait   Ambulation/Gait Assistance  4: Min assist    Ambulation/Gait Assistance Details  with cane (regular SPC vs rubber quad tip); wide BOS and still staggers to right; drifts; requires assist to catch his balance 3x over 150 ft    Ambulation Distance (Feet)  100 Feet   180, 150, 50 x2  Assistive device  Rolling walker;Straight cane    Gait Pattern  Step-through pattern;Decreased arm swing - left;Decreased step length - left;Decreased stance time - right;Decreased stride length;Lateral hip instability;Trunk flexed;Wide base of support   cane in right hand   Ambulation Surface  Level;Indoor      Vestibular Treatment/Exercise - 02/07/18 0947      Vestibular Treatment/Exercise   Gaze Exercises  X1 Viewing Horizontal      X1 Viewing Horizontal   Foot Position  standing    Comments  pt unable to attempt due to continued double vision post cataract procedure last week         Balance Exercises - 02/07/18 0921      Balance Exercises: Standing   Standing Eyes Opened  Narrow base of support (BOS);Head turns;Foam/compliant surface    Standing Eyes Closed  Narrow base of support (BOS);Wide (BOA);Foam/compliant surface;Solid surface;30 secs    Step Ups  Forward;2 inch;4 inch;Intermittent UE support   step taps alternating legs x 20   Partial Tandem Stance  Eyes open;Foam/compliant surface;Intermittent upper extremity support    Retro Gait  Foam/compliant surface;Upper extremity support;5 reps   blue mat   Sidestepping  Foam/compliant support;Upper extremity support;4 reps    Marching Limitations  blue mat 4 reps, UE support          PT Short Term Goals - 01/18/18 2136      PT SHORT TERM GOAL #1   Title   The patient will return demo HEP for gaze adaptation, hip flexor strengthening, balance and general mobility.  (STG goals due 02/17/18)    Time  4    Period  Weeks    Target Date  02/17/18      PT SHORT TERM GOAL #2   Title  The patient will improve Berg from 28/56 to > or equal to 34/56 to demonstrate improving steady state balance without UE support.    Time  4    Period  Weeks    Target Date  02/17/18      PT SHORT TERM GOAL #3   Title  The patient will improve TUG with RW from 17.47 seconds to < or equal to 14 seconds to demo improving functional mobility and dec'ing risk for falls.    Time  4    Period  Weeks    Target Date  02/17/18      PT SHORT TERM GOAL #4   Title  The patient will perform sit<>stand without UE support to demo improved functional strength in LEs.    Time  4    Period  Weeks    Target Date  02/17/18      PT SHORT TERM GOAL #5   Title  The patient will improve gait speed from 2.31 ft/sec to > or equal to 2.62 ft/sec to transition to "full community ambulator" classification of gait.    Time  4    Period  Weeks    Target Date  02/17/18        PT Long Term Goals - 01/18/18 2140      PT LONG TERM GOAL #1   Title  The patient will return demo progression of HEP and ongoing wellness plan.  (LTGs due date: 03/19/18)    Time  8    Period  Weeks    Target Date  03/19/18      PT LONG TERM GOAL #2   Title  The patient will improve Berg score from 28/56 to >  or equal to 40/56 to demo dec'ing risk for falls.    Time  8    Period  Weeks    Target Date  03/19/18      PT LONG TERM GOAL #3   Title  The patient will perform 5 time sit<>stand without UE support in < or equal to 14 seconds.    Time  8    Period  Weeks    Target Date  03/19/18      PT LONG TERM GOAL #4   Title  The patient will ambulate short community distances with least restrictive device mod indep (x 500 ft).    Time  8    Period  Weeks    Target Date  03/19/18      PT LONG TERM GOAL #5    Title  The patient will negotiate household surfaces with SPC (if appropriate as he progresses) mod indep x 300 ft.    Time  8    Period  Weeks    Target Date  03/19/18            Plan - 02/07/18 0935    Clinical Impression Statement  Session focused on gait training with SPC (reg tip vs rubber quad tip with pt doing better with regular tip) and balance training with pt continuing to have difficulty with EC and compliant surfaces (when vestibular system maximally challenged). Several times during session pt reported "I can't" when asked to attempt a balance challenge. With explanation and encouragement that he was safe, he would most often then attempt. Patient can continue to benefit from PT to achieve LTGs.     Rehab Potential  Good    PT Frequency  2x / week    PT Duration  8 weeks    PT Treatment/Interventions  ADLs/Self Care Home Management;DME Instruction;Gait training;Stair training;Functional mobility training;Therapeutic exercise;Therapeutic activities;Balance training;Neuromuscular re-education;Patient/family education;Vestibular;Canalith Repostioning    PT Next Visit Plan  GAIT WITH SPC only in PT session not a home. Check gaze x 1 standing with UE support (not performing until after cataract procedure due to double/blurry vision), Dynamic balance activities (compliant surfaces, head motion, turns, etc).  Gait with RW, transitional movements.      Consulted and Agree with Plan of Care  Patient       Patient will benefit from skilled therapeutic intervention in order to improve the following deficits and impairments:  Abnormal gait, Decreased activity tolerance, Decreased balance, Decreased endurance, Decreased coordination, Decreased knowledge of use of DME, Decreased mobility, Decreased strength, Postural dysfunction  Visit Diagnosis: Other abnormalities of gait and mobility  Unsteadiness on feet  Muscle weakness (generalized)     Problem List Patient Active Problem  List   Diagnosis Date Noted  . Encounter for therapeutic drug monitoring 12/20/2017  . S/P thoracotomy 11/29/2017  . Sepsis ([place])   . Hypoxemia   . Pneumonia of left lower lobe due to infectious organism ([place])   . Mediastinal lymphadenopathy   . Chronic cough 11/23/2017  . CLL (chronic lymphocytic leukemia) ([place]) 11/23/2017  . Pleural effusion 11/23/2017  . HCAP (healthcare-associated pneumonia) 11/20/2017  . Anemia 11/20/2017  . Ischemic cardiomyopathy 09/01/2017  . Right hip pain   . Acute otitis media   . Chronic systolic CHF (congestive heart failure) ([place])   . Pressure injury of skin 07/20/2017  . Malignant otitis externa 07/19/2017  . Fall at home, initial encounter 07/19/2017  . S/P left THA, AA 05/12/2016  . LPRD (laryngopharyngeal reflux disease) 05/21/2015  .  Other allergic rhinitis 05/21/2015  . Obesity 12/12/2014  . Upper airway cough syndrome 12/11/2014  . Cough variant asthma 07/27/2014  . Wheezing 06/20/2014  . Symptomatic bradycardia - s/p Biotronik (serial number [id number]) 02/08/2014  . Diabetes mellitus type 2, noninsulin dependent ([place])   . Edema of foot 03/06/2013  . Traumatic ecchymosis of right foot 03/06/2013  . Bone spur 12/30/2012  . Pain of toe of left foot 12/30/2012  . Hyperlipidemia 07/13/2012  . HTN (hypertension) 04/12/2012  . Sick sinus syndrome ([place]) 08/17/2011  . PAF (paroxysmal atrial fibrillation) ([place]) 09/16/2010    Rexanne Mano, PT 02/07/2018, 9:48 AM  [facility] [address] [place], [name], [address] Phone: [phone]   Fax:  [phone]  Name: Oscar Baker MRN: [id number] Date of Birth: 03/03/1933

## 2018-02-09 ENCOUNTER — Encounter: Payer: Self-pay | Admitting: Physical Therapy

## 2018-02-09 ENCOUNTER — Ambulatory Visit: Payer: Medicare Other | Admitting: Physical Therapy

## 2018-02-09 ENCOUNTER — Ambulatory Visit (INDEPENDENT_AMBULATORY_CARE_PROVIDER_SITE_OTHER): Payer: Medicare Other

## 2018-02-09 DIAGNOSIS — R2689 Other abnormalities of gait and mobility: Secondary | ICD-10-CM | POA: Diagnosis not present

## 2018-02-09 DIAGNOSIS — M6281 Muscle weakness (generalized): Secondary | ICD-10-CM

## 2018-02-09 DIAGNOSIS — R2681 Unsteadiness on feet: Secondary | ICD-10-CM

## 2018-02-09 DIAGNOSIS — I480 Paroxysmal atrial fibrillation: Secondary | ICD-10-CM

## 2018-02-09 DIAGNOSIS — Z5181 Encounter for therapeutic drug level monitoring: Secondary | ICD-10-CM | POA: Diagnosis not present

## 2018-02-09 DIAGNOSIS — I482 Chronic atrial fibrillation: Secondary | ICD-10-CM

## 2018-02-09 DIAGNOSIS — I4821 Permanent atrial fibrillation: Secondary | ICD-10-CM

## 2018-02-09 DIAGNOSIS — R296 Repeated falls: Secondary | ICD-10-CM | POA: Diagnosis not present

## 2018-02-09 LAB — POCT INR: INR: 2.3 (ref 2.0–3.0)

## 2018-02-09 NOTE — Therapy (Signed)
[facility] [address] [place], [name], [address] Phone: [phone]   Fax:  [phone]  Physical Therapy Treatment  Patient Details  Name: Oscar Baker MRN: [id number] Date of Birth: [birth year] Referring Provider: unsteadiness on feet, muscle weakness   Encounter Date: 02/09/2018  PT End of Session - 02/09/18 1410    Visit Number  7    Number of Visits  17    Date for PT Re-Evaluation  03/19/18    Authorization Type  Medicare & BCBS supplement    Authorization Time Period  medicare guidelines with medical necessity; 100% coverage    PT Start Time  0846    PT Stop Time  0925    PT Time Calculation (min)  39 min    Equipment Utilized During Treatment  Gait belt    Activity Tolerance  Patient tolerated treatment well    Behavior During Therapy  [facility] for tasks assessed/performed       Past Medical History:  Diagnosis Date  . Asthma 1950's   history of  . Atrial fibrillation ([place])   . Bilateral carpal tunnel syndrome   . Bilateral lower extremity edema   . Bladder tumor   . Chronic systolic heart failure (HCC)    Echo 1/19: Mild LVH, EF 45-50, inf HK, MAC, severe LAE, severe RAE // Echo 7/15: Mild LVH, mod focal basal sept hypertrophy, EF 55-60, AV peak and mean 16/9, trivial MR, mod LAE, PASP 38  . CLL (chronic lymphocytic leukemia) [facility]) oncologist-  dr Ilene Qua--   dx [phone] ;  Lymphocytosis, CLL - per lov note 05-11-2017 currently under active survillance,  CT 04-17-2014 show very small lymphadenopathy, no indication for treatment  . Coronary artery disease    cardiologist-  dr Cathie Olden--  08-18-2017 Intermittant risk nuclear study w/ large area of inferior infartion with no evidence ishcemia   . 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 Diabetes mellitus type 2, noninsulin dependent ([place])   . Elevated PSA    since prostatectomy but now resolved  . Hematuria 04/2017  . History of ear infection    Right  . History of MI  (myocardial infarction)    per myoview nuclear study 08-18-2017 , unknown when  . History of shingles 08/2017   L ear and scalp, possible  . Hyperlipidemia   . Hypertension   . Ischemic cardiomyopathy 09/01/2017   Presumed +CAD with Nuclear stress test 08/18/17 - Inferior scar, no ischemia, intermediate risk // med management unless +angina or worse dyspnea  . OA (osteoarthritis)   . Pacemaker 02/08/2014   followed by dr g. taylor--  single chamber Biotronik due to SSS  . Permanent atrial fibrillation ([place])   . Pneumonia 2019   Left lung  . Prostate cancer [facility]) urologist-  dr Diona Fanti   dx 2004--  Gleason 8, PSA 10.45--  11-28-2002  s/p  radical prostatectomy;  recurrent w/ increasing PSA, started ADT treatment  . RBBB (right bundle branch block)   . Sick sinus syndrome ([place])    a-Flutter with episodes of bradycardia; S/P Biotronik (serial number [id number]) 02-08-2014  . Urinary incontinence   . Wears hearing aid in right ear    receiver and transmitter    Past Surgical History:  Procedure Laterality Date  . APPENDECTOMY    . BACK SURGERY     disk  . CARDIAC CATHETERIZATION  09-03-1999  dr Cathie Olden   abnormal cardiolite study:  minor luminal irregularities but no critial coronary artery  stenosis  . CARDIOVASCULAR STRESS TEST  08-18-2017  dr Cathie Olden   Intermediate risk nuclear study w/ large area inferior infarction, no evidence of ishcemia (consistant w/ prior MI)/  study not gated due to frequent PVCs  . CARPAL TUNNEL RELEASE Right 2000  . CARPAL TUNNEL RELEASE Left 11/19/2009  . CATARACT EXTRACTION Right 07/2015  . CATARACT EXTRACTION Left 09/2015  . CYSTOSCOPY N/A 11/04/2017   Procedure: CYSTOSCOPY AND CAUTERIZATION OF BLADDER;  Surgeon: Franchot Gallo, MD;  Location[facility];  Service: Urology;  Laterality: N/A;  . INSERTION PENILE PROSTHESIS  02-22-2004    dr Mattie Marlin  [facility]  . IR THORACENTESIS ASP PLEURAL SPACE W/IMG GUIDE  11/26/2017  . KNEE ARTHROSCOPY  Left 07/2010  . PERMANENT PACEMAKER INSERTION N/A 02/08/2014   Procedure: PERMANENT PACEMAKER INSERTION;  Surgeon: Evans Lance, MD;  Location: [facility] CATH LAB;  Service: Cardiovascular;  Laterality: N/A;  . PLEURAL EFFUSION DRAINAGE Left 11/30/2017   Procedure: DRAINAGE OF HEMOTHORAX;  Surgeon: Ivin Poot, MD;  Location: [facility];  Service: Thoracic;  Laterality: Left;  . RADICAL RETROPUBIC PROSTATECTOMY W/ BILATERAL PELVIC LYMPH NODE DISSECTION  11-28-2002   dr Mattie Marlin  [facility]  . TONSILLECTOMY    . TOTAL HIP ARTHROPLASTY Left 05/12/2016   Procedure: LEFT TOTAL HIP ARTHROPLASTY ANTERIOR APPROACH;  Surgeon: Paralee Cancel, MD;  Location: [facility];  Service: Orthopedics;  Laterality: Left;  . TOTAL HIP ARTHROPLASTY Right 07-15-2006   dr Alvan Dame  [facility]  . TRANSTHORACIC ECHOCARDIOGRAM  07/20/2017   mild LVH, ef 45-50%, hypokinesis of the basal-midinferior myocardium, due to AFib unable to evaluate diastolic function/  severe LAE and RAE/  trivial PR and TR  . VIDEO ASSISTED THORACOSCOPY Left 11/30/2017   Procedure: VIDEO ASSISTED THORACOSCOPY;  Surgeon: Ivin Poot, MD;  Location: [facility];  Service: Thoracic;  Laterality: Left;  [name] VIDEO ASSISTED THORACOSCOPY (VATS)/EMPYEMA Left 11/29/2017   Procedure: VIDEO ASSISTED THORACOSCOPY (VATS)/EMPYEMA;  Surgeon: Ivin Poot, MD;  Location: [facility];  Service: Thoracic;  Laterality: Left;    There were no vitals filed for this visit.  Subjective Assessment - 02/09/18 0848    Subjective  No changes no falls. Was able to get his feet 1" apart and hold fo r30 sec x 3. Tried to do his execise with "A" and as soon as he moves his head the letter doubles.    Pertinent History  Left THA 05/12/16, right THA 2008, CHF, MI, DM2, HTN, OA, prostate CA, shingles, A-fib, hearing loss, knee sg, recent thorascopy.  05/02/2017- bleeding and began with urinary incontinence    Patient Stated Goals  work on balance, "be able to walk, get off the walker (I'll live with a cane)".   Patient has used the walker since January when he fell.    Currently in Pain?  No/denies                       [facility] Adult PT Treatment/Exercise - 02/09/18 1402      Ambulation/Gait   Ambulation/Gait Assistance  4: Min guard;4: Min assist    Ambulation/Gait Assistance Details  assist for stability (especially with no device); pt more stable when taking shorter step length    Ambulation Distance (Feet)  80 Feet   115, 50, 115   Assistive device  Rolling walker;Straight cane;None    Gait Pattern  Step-through pattern;Decreased arm swing - left;Decreased stance time - right;Decreased stride length;Lateral hip instability;Trunk flexed;Wide base of support  Vestibular Treatment/Exercise - 02/09/18 1352      Vestibular Treatment/Exercise   Vestibular Treatment Provided  Gaze    Gaze Exercises  X1 Viewing Horizontal;Eye/Head Exercise Horizontal      X1 Viewing Horizontal   Foot Position  sitting     Time  --   30   Reps  3    Comments  slow and progressing to more quickly      Eye/Head Exercise Horizontal   Foot Position  seated    Reps  10    Comments  smooth pursuit moving A from left to right         Balance Exercises - 02/09/18 1358      Balance Exercises: Standing   Rockerboard  Anterior/posterior;EO;Head turns    Step Ups  Forward;6 inch;UE support 1   10 each leg leading   Gait with Head Turns  Forward;Retro;Intermittent upper extremity support    Tandem Gait  Forward;Intermittent upper extremity support    Retro Gait  3 reps    Sidestepping  4 reps          PT Short Term Goals - 01/18/18 2136      PT SHORT TERM GOAL #1   Title  The patient will return demo HEP for gaze adaptation, hip flexor strengthening, balance and general mobility.  (STG goals due 02/17/18)    Time  4    Period  Weeks    Target Date  02/17/18      PT SHORT TERM GOAL #2   Title  The patient will improve Berg from 28/56 to > or equal to 34/56 to demonstrate improving  steady state balance without UE support.    Time  4    Period  Weeks    Target Date  02/17/18      PT SHORT TERM GOAL #3   Title  The patient will improve TUG with RW from 17.47 seconds to < or equal to 14 seconds to demo improving functional mobility and dec'ing risk for falls.    Time  4    Period  Weeks    Target Date  02/17/18      PT SHORT TERM GOAL #4   Title  The patient will perform sit<>stand without UE support to demo improved functional strength in LEs.    Time  4    Period  Weeks    Target Date  02/17/18      PT SHORT TERM GOAL #5   Title  The patient will improve gait speed from 2.31 ft/sec to > or equal to 2.62 ft/sec to transition to "full community ambulator" classification of gait.    Time  4    Period  Weeks    Target Date  02/17/18        PT Long Term Goals - 01/18/18 2140      PT LONG TERM GOAL #1   Title  The patient will return demo progression of HEP and ongoing wellness plan.  (LTGs due date: 03/19/18)    Time  8    Period  Weeks    Target Date  03/19/18      PT LONG TERM GOAL #2   Title  The patient will improve Berg score from 28/56 to > or equal to 40/56 to demo dec'ing risk for falls.    Time  8    Period  Weeks    Target Date  03/19/18      PT LONG TERM GOAL #  3   Title  The patient will perform 5 time sit<>stand without UE support in < or equal to 14 seconds.    Time  8    Period  Weeks    Target Date  03/19/18      PT LONG TERM GOAL #4   Title  The patient will ambulate short community distances with least restrictive device mod indep (x 500 ft).    Time  8    Period  Weeks    Target Date  03/19/18      PT LONG TERM GOAL #5   Title  The patient will negotiate household surfaces with SPC (if appropriate as he progresses) mod indep x 300 ft.    Time  8    Period  Weeks    Target Date  03/19/18            Plan - 02/09/18 1413    Clinical Impression Statement  Session focused on gait trainging with use of cane, balance  training, and strength training to improve his stability and reduce fall risk. Patient highly motivated and making progress.     Rehab Potential  Good    PT Frequency  2x / week    PT Duration  8 weeks    PT Treatment/Interventions  ADLs/Self Care Home Management;DME Instruction;Gait training;Stair training;Functional mobility training;Therapeutic exercise;Therapeutic activities;Balance training;Neuromuscular re-education;Patient/family education;Vestibular;Canalith Repostioning    PT Next Visit Plan  GAIT WITH SPC only in PT session not a home. Check gaze x 1 sitting vs standing with UE support, Dynamic balance activities (compliant surfaces, head motion, turns, etc).  Gait with RW, transitional movements.      Consulted and Agree with Plan of Care  Patient       Patient will benefit from skilled therapeutic intervention in order to improve the following deficits and impairments:  Abnormal gait, Decreased activity tolerance, Decreased balance, Decreased endurance, Decreased coordination, Decreased knowledge of use of DME, Decreased mobility, Decreased strength, Postural dysfunction  Visit Diagnosis: Other abnormalities of gait and mobility  Unsteadiness on feet  Muscle weakness (generalized)     Problem List Patient Active Problem List   Diagnosis Date Noted  . Encounter for therapeutic drug monitoring 12/20/2017  . S/P thoracotomy 11/29/2017  . Sepsis ([place])   . Hypoxemia   . Pneumonia of left lower lobe due to infectious organism ([place])   . Mediastinal lymphadenopathy   . Chronic cough 11/23/2017  . CLL (chronic lymphocytic leukemia) ([place]) 11/23/2017  . Pleural effusion 11/23/2017  . HCAP (healthcare-associated pneumonia) 11/20/2017  . Anemia 11/20/2017  . Ischemic cardiomyopathy 09/01/2017  . Right hip pain   . Acute otitis media   . Chronic systolic CHF (congestive heart failure) ([place])   . Pressure injury of skin 07/20/2017  . Malignant otitis externa 07/19/2017  . Fall  at home, initial encounter 07/19/2017  . S/P left THA, AA 05/12/2016  . LPRD (laryngopharyngeal reflux disease) 05/21/2015  . Other allergic rhinitis 05/21/2015  . Obesity 12/12/2014  . Upper airway cough syndrome 12/11/2014  . Cough variant asthma 07/27/2014  . Wheezing 06/20/2014  . Symptomatic bradycardia - s/p Biotronik (serial number [id number]) 02/08/2014  . Diabetes mellitus type 2, noninsulin dependent ([place])   . Edema of foot 03/06/2013  . Traumatic ecchymosis of right foot 03/06/2013  . Bone spur 12/30/2012  . Pain of toe of left foot 12/30/2012  . Hyperlipidemia 07/13/2012  . HTN (hypertension) 04/12/2012  . Sick sinus syndrome ([place]) 08/17/2011  . PAF (paroxysmal  atrial fibrillation) ([place]) 09/16/2010    Rexanne Mano, PT 02/09/2018, 2:23 PM  [facility] [address] [place], [name], [address] Phone: [phone]   Fax:  [phone]  Name: JALEEL ALLEN MRN: [id number] Date of Birth: 12-01-1932

## 2018-02-09 NOTE — Patient Instructions (Signed)
Description   Continue on same dosage 1 tablet every day except 1.5 tablets on Saturdays. Recheck INR in 4 weeks.  Call [facility] [phone] with any concerns.

## 2018-02-11 ENCOUNTER — Telehealth: Payer: Self-pay | Admitting: Internal Medicine

## 2018-02-11 MED ORDER — BENZONATATE 200 MG PO CAPS: 200.0000 mg | ORAL_CAPSULE | Freq: Three times a day (TID) | ORAL | 0 refills | Status: DC | PRN

## 2018-02-11 NOTE — Telephone Encounter (Signed)
Called and spoke with patient, medication refill sent. Nothing further needed.

## 2018-02-11 NOTE — Telephone Encounter (Signed)
Called and spoke with patient, he is requesting a 90 quantity supply of tessalon pearles.     MW please advise if you are ok with this. Thank you.

## 2018-02-11 NOTE — Telephone Encounter (Signed)
Ok  X one refill

## 2018-02-14 ENCOUNTER — Encounter: Payer: Self-pay | Admitting: Rehabilitative and Restorative Service Providers"

## 2018-02-14 ENCOUNTER — Ambulatory Visit: Payer: Medicare Other | Admitting: Rehabilitative and Restorative Service Providers"

## 2018-02-14 DIAGNOSIS — H6123 Impacted cerumen, bilateral: Secondary | ICD-10-CM | POA: Insufficient documentation

## 2018-02-14 DIAGNOSIS — R296 Repeated falls: Secondary | ICD-10-CM

## 2018-02-14 DIAGNOSIS — R2689 Other abnormalities of gait and mobility: Secondary | ICD-10-CM | POA: Diagnosis not present

## 2018-02-14 DIAGNOSIS — H903 Sensorineural hearing loss, bilateral: Secondary | ICD-10-CM | POA: Insufficient documentation

## 2018-02-14 DIAGNOSIS — M6281 Muscle weakness (generalized): Secondary | ICD-10-CM

## 2018-02-14 DIAGNOSIS — R2681 Unsteadiness on feet: Secondary | ICD-10-CM

## 2018-02-14 DIAGNOSIS — H905 Unspecified sensorineural hearing loss: Secondary | ICD-10-CM | POA: Insufficient documentation

## 2018-02-14 NOTE — Therapy (Signed)
[facility] [address] [place], [name], [address] Phone: [phone]   Fax:  [phone]  Physical Therapy Treatment  Patient Details  Name: Oscar Baker MRN: [id number] Date of Birth: [birth year] Referring Provider: unsteadiness on feet, muscle weakness   Encounter Date: 02/14/2018  PT End of Session - 02/14/18 0926    Visit Number  8    Number of Visits  17    Date for PT Re-Evaluation  03/19/18    Authorization Type  Medicare & BCBS supplement    Authorization Time Period  medicare guidelines with medical necessity; 100% coverage    PT Start Time  0926    PT Stop Time  1012    PT Time Calculation (min)  46 min    Equipment Utilized During Treatment  Gait belt    Activity Tolerance  Patient tolerated treatment well    Behavior During Therapy  [facility] for tasks assessed/performed       Past Medical History:  Diagnosis Date  . Asthma 1950's   history of  . Atrial fibrillation ([place])   . Bilateral carpal tunnel syndrome   . Bilateral lower extremity edema   . Bladder tumor   . Chronic systolic heart failure (HCC)    Echo 1/19: Mild LVH, EF 45-50, inf HK, MAC, severe LAE, severe RAE // Echo 7/15: Mild LVH, mod focal basal sept hypertrophy, EF 55-60, AV peak and mean 16/9, trivial MR, mod LAE, PASP 38  . CLL (chronic lymphocytic leukemia) [facility]) oncologist-  dr Ilene Qua--   dx [phone] ;  Lymphocytosis, CLL - per lov note 05-11-2017 currently under active survillance,  CT 04-17-2014 show very small lymphadenopathy, no indication for treatment  . Coronary artery disease    cardiologist-  dr Cathie Olden--  08-18-2017 Intermittant risk nuclear study w/ large area of inferior infartion with no evidence ishcemia   . 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 Diabetes mellitus type 2, noninsulin dependent ([place])   . Elevated PSA    since prostatectomy but now resolved  . Hematuria 04/2017  . History of ear infection    Right  . History of MI  (myocardial infarction)    per myoview nuclear study 08-18-2017 , unknown when  . History of shingles 08/2017   L ear and scalp, possible  . Hyperlipidemia   . Hypertension   . Ischemic cardiomyopathy 09/01/2017   Presumed +CAD with Nuclear stress test 08/18/17 - Inferior scar, no ischemia, intermediate risk // med management unless +angina or worse dyspnea  . OA (osteoarthritis)   . Pacemaker 02/08/2014   followed by dr g. taylor--  single chamber Biotronik due to SSS  . Permanent atrial fibrillation ([place])   . Pneumonia 2019   Left lung  . Prostate cancer [facility]) urologist-  dr Diona Fanti   dx 2004--  Gleason 8, PSA 10.45--  11-28-2002  s/p  radical prostatectomy;  recurrent w/ increasing PSA, started ADT treatment  . RBBB (right bundle branch block)   . Sick sinus syndrome ([place])    a-Flutter with episodes of bradycardia; S/P Biotronik (serial number [id number]) 02-08-2014  . Urinary incontinence   . Wears hearing aid in right ear    receiver and transmitter    Past Surgical History:  Procedure Laterality Date  . APPENDECTOMY    . BACK SURGERY     disk  . CARDIAC CATHETERIZATION  09-03-1999  dr Cathie Olden   abnormal cardiolite study:  minor luminal irregularities but no critial coronary artery  stenosis  . CARDIOVASCULAR STRESS TEST  08-18-2017  dr Cathie Olden   Intermediate risk nuclear study w/ large area inferior infarction, no evidence of ishcemia (consistant w/ prior MI)/  study not gated due to frequent PVCs  . CARPAL TUNNEL RELEASE Right 2000  . CARPAL TUNNEL RELEASE Left 11/19/2009  . CATARACT EXTRACTION Right 07/2015  . CATARACT EXTRACTION Left 09/2015  . CYSTOSCOPY N/A 11/04/2017   Procedure: CYSTOSCOPY AND CAUTERIZATION OF BLADDER;  Surgeon: Franchot Gallo, MD;  Location[facility];  Service: Urology;  Laterality: N/A;  . INSERTION PENILE PROSTHESIS  02-22-2004    dr Mattie Marlin  [facility]  . IR THORACENTESIS ASP PLEURAL SPACE W/IMG GUIDE  11/26/2017  . KNEE ARTHROSCOPY  Left 07/2010  . PERMANENT PACEMAKER INSERTION N/A 02/08/2014   Procedure: PERMANENT PACEMAKER INSERTION;  Surgeon: Evans Lance, MD;  Location[facility] C[facility] LAB;  Service: Cardiovascular;  Laterality: N/A;  . PLEURAL EFFUSION DRAINAGE Left 11/30/2017   Procedure: DRAINAGE OF HEMOTHORAX;  Surgeon: Ivin Poot, MD;  Location: [place];  Service: Thoracic;  Laterality: Left;  . RADICAL RETROPUBIC PROSTATECTOMY W/ BILATERAL PELVIC LYMPH NODE DISSECTION  11-28-2002   dr Mattie Marlin  [facility]  . TONSILLECTOMY    . TOTAL HIP ARTHROPLASTY Left 05/12/2016   Procedure: LEFT TOTAL HIP ARTHROPLASTY ANTERIOR APPROACH;  Surgeon: Paralee Cancel, MD;  Location: [facility];  Service: Orthopedics;  Laterality: Left;  . TOTAL HIP ARTHROPLASTY Right 07-15-2006   dr Alvan Dame  [facility]  . TRANSTHORACIC ECHOCARDIOGRAM  07/20/2017   mild LVH, ef 45-50%, hypokinesis of the basal-midinferior myocardium, due to AFib unable to evaluate diastolic function/  severe LAE and RAE/  trivial PR and TR  . VIDEO ASSISTED THORACOSCOPY Left 11/30/2017   Procedure: VIDEO ASSISTED THORACOSCOPY;  Surgeon: Ivin Poot, MD;  Location: [place];  Service: Thoracic;  Laterality: Left;  [name] VIDEO ASSISTED THORACOSCOPY (VATS)/EMPYEMA Left 11/29/2017   Procedure: VIDEO ASSISTED THORACOSCOPY (VATS)/EMPYEMA;  Surgeon: Ivin Poot, MD;  Location: [place];  Service: Thoracic;  Laterality: Left;    There were no vitals filed for this visit.  Subjective Assessment - 02/14/18 0928    Subjective  The patient notes he is ready for the cane, but has not used it at home yet.    He notes he is seeing double with the film on his right eye (having it removed on Thursday).      Pertinent History  Left THA 05/12/16, right THA 2008, CHF, MI, DM2, HTN, OA, prostate CA, shingles, A-fib, hearing loss, knee sg, recent thorascopy.  05/02/2017- bleeding and began with urinary incontinence    Patient Stated Goals  work on balance, "be able to walk, get off the walker (I'll live with a  cane)".  Patient has used the walker since January when he fell.    Currently in Pain?  No/denies         [facility] PT Assessment - 02/14/18 0931      Berg Balance Test   Sit to Stand  Able to stand without using hands and stabilize independently    Standing Unsupported  Able to stand safely 2 minutes    Sitting with Back Unsupported but Feet Supported on Floor or Stool  Able to sit safely and securely 2 minutes    Stand to Sit  Sits safely with minimal use of hands    Transfers  Able to transfer safely, minor use of hands    Standing Unsupported with Eyes Closed  Able to  stand 10 seconds safely    Standing Ubsupported with Feet Together  Able to place feet together independently and stand 1 minute safely    From Standing, Reach Forward with Outstretched Arm  Can reach forward >12 cm safely (5")    From Standing Position, Pick up Object from [place] to pick up shoe safely and easily    From Standing Position, Turn to Look Behind Over each Shoulder  Looks behind from both sides and weight shifts well    Turn 360 Degrees  Needs close supervision or verbal cueing    Standing Unsupported, Alternately Place Feet on Step/Stool  Needs assistance to keep from falling or unable to try    Standing Unsupported, One Foot in [place] to take small step independently and hold 30 seconds    Standing on One Leg  Tries to lift leg/unable to hold 3 seconds but remains standing independently    Total Score  43    Berg comment:  Improved from 28/56                   [facility] Adult PT Treatment/Exercise - 02/14/18 1004      Ambulation/Gait   Ambulation/Gait  Yes    Ambulation/Gait Assistance  4: Min guard;4: Min assist    Ambulation/Gait Assistance Details  Needs CGA to min A when ambulating with SPC; mod indep with RW    Ambulation Distance (Feet)  200 Feet   75, 100, 30   Assistive device  Rolling walker;Straight cane    Gait Pattern  Step-through pattern;Decreased arm swing -  left;Decreased stance time - right;Decreased stride length;Lateral hip instability;Trunk flexed;Wide base of support    Ambulation Surface  Level;Indoor    Gait Comments  Toe and heel walking in parallel bars for intermittent UE support with CGA, backwards walking with CGA, marching iwth high knees in parallel bars.       Neuro Re-ed    Neuro Re-ed Details   Standing in stride position rocking anterior/posteriorly working on hip and ankle strategy.        Exercises   Exercises  Ankle      Ankle Exercises: Standing   Heel Raises  10 reps    Heel Raises Limitations  Performed in parallel bars dec'ing UE support, performed with UEs reaching along wall for greater range of motion x 10 reps and performed in a stride standing position.      Ankle Exercises: Seated   Other Seated Ankle Exercises  Yellow theraband ankle eversion seated x 20 reps and then with green band x 10 reps.          Balance Exercises - 02/14/18 1013      Balance Exercises: Standing   Rockerboard  Anterior/posterior;Lateral;EO;Head turns;Intermittent UE support   head motion/ needs CGA to min A   Step Ups  Lateral;4 inch;UE support 1;UE support 2   x 10 reps alternating.   Sidestepping  4 reps   10 feet dec'ing UE support         PT Short Term Goals - 02/14/18 5035      PT SHORT TERM GOAL #1   Title  The patient will return demo HEP for gaze adaptation, hip flexor strengthening, balance and general mobility.  (STG goals due 02/17/18)    Time  4    Period  Weeks      PT SHORT TERM GOAL #2   Title  The patient will improve Berg from 28/56  to > or equal to 34/56 to demonstrate improving steady state balance without UE support.    Baseline  Improved to 43/56    Time  4    Period  Weeks    Status  Achieved      PT SHORT TERM GOAL #3   Title  The patient will improve TUG with RW from 17.47 seconds to < or equal to 14 seconds to demo improving functional mobility and dec'ing risk for falls.    Time  4     Period  Weeks      PT SHORT TERM GOAL #4   Title  The patient will perform sit<>stand without UE support to demo improved functional strength in LEs.    Time  4    Period  Weeks    Status  Achieved      PT SHORT TERM GOAL #5   Title  The patient will improve gait speed from 2.31 ft/sec to > or equal to 2.62 ft/sec to transition to "full community ambulator" classification of gait.    Baseline  2.43 ft/sec measured on 02/14/2018    Time  4    Period  Weeks    Status  Partially Met        PT Long Term Goals - 01/18/18 2140      PT LONG TERM GOAL #1   Title  The patient will return demo progression of HEP and ongoing wellness plan.  (LTGs due date: 03/19/18)    Time  8    Period  Weeks    Target Date  03/19/18      PT LONG TERM GOAL #2   Title  The patient will improve Berg score from 28/56 to > or equal to 40/56 to demo dec'ing risk for falls.    Time  8    Period  Weeks    Target Date  03/19/18      PT LONG TERM GOAL #3   Title  The patient will perform 5 time sit<>stand without UE support in < or equal to 14 seconds.    Time  8    Period  Weeks    Target Date  03/19/18      PT LONG TERM GOAL #4   Title  The patient will ambulate short community distances with least restrictive device mod indep (x 500 ft).    Time  8    Period  Weeks    Target Date  03/19/18      PT LONG TERM GOAL #5   Title  The patient will negotiate household surfaces with SPC (if appropriate as he progresses) mod indep x 300 ft.    Time  8    Period  Weeks    Target Date  03/19/18            Plan - 02/14/18 1024    Clinical Impression Statement  The patient met STG for Berg balance test and PT educated patient on progress and continued fall risk.  We discussed using RW at home due to dec'd safety with SPC.  Patient improving with gait speed, but not yet at STG.  Will complete checking STGs and review HEP next visit.     PT Treatment/Interventions  ADLs/Self Care Home Management;DME  Instruction;Gait training;Stair training;Functional mobility training;Therapeutic exercise;Therapeutic activities;Balance training;Neuromuscular re-education;Patient/family education;Vestibular;Canalith Repostioning    PT Next Visit Plan  CHECK SHORT TERM GOALS AND HEP.  GAIT WITH SPC only in PT session not a home. Check  gaze x 1 sitting vs standing with UE support, Dynamic balance activities (compliant surfaces, head motion, turns, etc).  Gait with RW, transitional movements.      Consulted and Agree with Plan of Care  Patient       Patient will benefit from skilled therapeutic intervention in order to improve the following deficits and impairments:  Abnormal gait, Decreased activity tolerance, Decreased balance, Decreased endurance, Decreased coordination, Decreased knowledge of use of DME, Decreased mobility, Decreased strength, Postural dysfunction  Visit Diagnosis: Other abnormalities of gait and mobility  Unsteadiness on feet  Muscle weakness (generalized)  Repeated falls     Problem List Patient Active Problem List   Diagnosis Date Noted  . Encounter for therapeutic drug monitoring 12/20/2017  . S/P thoracotomy 11/29/2017  . Sepsis ([place])   . Hypoxemia   . Pneumonia of left lower lobe due to infectious organism ([place])   . Mediastinal lymphadenopathy   . Chronic cough 11/23/2017  . CLL (chronic lymphocytic leukemia) ([place]) 11/23/2017  . Pleural effusion 11/23/2017  . HCAP (healthcare-associated pneumonia) 11/20/2017  . Anemia 11/20/2017  . Ischemic cardiomyopathy 09/01/2017  . Right hip pain   . Acute otitis media   . Chronic systolic CHF (congestive heart failure) ([place])   . Pressure injury of skin 07/20/2017  . Malignant otitis externa 07/19/2017  . Fall at home, initial encounter 07/19/2017  . S/P left THA, AA 05/12/2016  . LPRD (laryngopharyngeal reflux disease) 05/21/2015  . Other allergic rhinitis 05/21/2015  . Obesity 12/12/2014  . Upper airway cough syndrome  12/11/2014  . Cough variant asthma 07/27/2014  . Wheezing 06/20/2014  . Symptomatic bradycardia - s/p Biotronik (serial number [id number]) 02/08/2014  . Diabetes mellitus type 2, noninsulin dependent ([place])   . Edema of foot 03/06/2013  . Traumatic ecchymosis of right foot 03/06/2013  . Bone spur 12/30/2012  . Pain of toe of left foot 12/30/2012  . Hyperlipidemia 07/13/2012  . HTN (hypertension) 04/12/2012  . Sick sinus syndrome ([place]) 08/17/2011  . PAF (paroxysmal atrial fibrillation) ([place]) 09/16/2010    Daundre Biel,PT 02/14/2018, 10:25 AM  [facility] [address] [place], [name], [address] Phone: [phone]   Fax:  [phone]  Name: SAYVION VIGEN MRN: [id number] Date of Birth: Oct 23, 1932

## 2018-02-16 ENCOUNTER — Encounter: Payer: Self-pay | Admitting: Rehabilitative and Restorative Service Providers"

## 2018-02-16 ENCOUNTER — Ambulatory Visit: Payer: Medicare Other | Admitting: Rehabilitative and Restorative Service Providers"

## 2018-02-16 DIAGNOSIS — R2681 Unsteadiness on feet: Secondary | ICD-10-CM

## 2018-02-16 DIAGNOSIS — R296 Repeated falls: Secondary | ICD-10-CM | POA: Diagnosis not present

## 2018-02-16 DIAGNOSIS — M6281 Muscle weakness (generalized): Secondary | ICD-10-CM | POA: Diagnosis not present

## 2018-02-16 DIAGNOSIS — R2689 Other abnormalities of gait and mobility: Secondary | ICD-10-CM

## 2018-02-16 NOTE — Patient Instructions (Signed)
Access Code: [email or web address]  URL: [email or web address]  Date: 02/16/2018  Prepared by: Rudell Cobb   Exercises Heel Raises with Counter Support - 20 reps - 1 sets - 1x daily - 7x weekly Toe Raises with Counter Support - 20 reps - 1 sets - 1x daily - 7x weekly Standing Hip Abduction with Theraband Resistance - 10 reps - 3 sets - 1x daily - 7x weekly Seated Ankle Eversion with Anchored Resistance - 10 reps - 3 sets - 1x daily - 7x weekly Narrow Stance with Eyes Closed and Head Nods - 3 reps - 1 sets - 30 seconds hold - 2x daily - 7x weekly

## 2018-02-17 DIAGNOSIS — H26491 Other secondary cataract, right eye: Secondary | ICD-10-CM | POA: Diagnosis not present

## 2018-02-17 NOTE — Therapy (Signed)
[facility] [address] [place], [name], [address] Phone: [phone]   Fax:  [phone]  Physical Therapy Treatment  Patient Details  Name: Oscar Baker MRN: [id number] Date of Birth: February 14, 1933 Referring Provider: unsteadiness on feet, muscle weakness   Encounter Date: 02/16/2018  PT End of Session - 02/17/18 1204    Visit Number  9    Number of Visits  17    Date for PT Re-Evaluation  03/19/18    Authorization Type  Medicare & BCBS supplement    Authorization Time Period  medicare guidelines with medical necessity; 100% coverage    PT Start Time  0932    PT Stop Time  1014    PT Time Calculation (min)  42 min    Equipment Utilized During Treatment  Gait belt    Activity Tolerance  Patient tolerated treatment well    Behavior During Therapy  [facility] for tasks assessed/performed       Past Medical History:  Diagnosis Date  . Asthma 1950's   history of  . Atrial fibrillation ([place])   . Bilateral carpal tunnel syndrome   . Bilateral lower extremity edema   . Bladder tumor   . Chronic systolic heart failure (HCC)    Echo 1/19: Mild LVH, EF 45-50, inf HK, MAC, severe LAE, severe RAE // Echo 7/15: Mild LVH, mod focal basal sept hypertrophy, EF 55-60, AV peak and mean 16/9, trivial MR, mod LAE, PASP 38  . CLL (chronic lymphocytic leukemia) [facility]) oncologist-  dr Ilene Qua--   dx [phone] ;  Lymphocytosis, CLL - per lov note 05-11-2017 currently under active survillance,  CT 04-17-2014 show very small lymphadenopathy, no indication for treatment  . Coronary artery disease    cardiologist-  dr Cathie Olden--  08-18-2017 Intermittant risk nuclear study w/ large area of inferior infartion with no evidence ishcemia   . 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 Diabetes mellitus type 2, noninsulin dependent ([place])   . Elevated PSA    since prostatectomy but now resolved  . Hematuria 04/2017  . History of ear infection    Right  . History of MI  (myocardial infarction)    per myoview nuclear study 08-18-2017 , unknown when  . History of shingles 08/2017   L ear and scalp, possible  . Hyperlipidemia   . Hypertension   . Ischemic cardiomyopathy 09/01/2017   Presumed +CAD with Nuclear stress test 08/18/17 - Inferior scar, no ischemia, intermediate risk // med management unless +angina or worse dyspnea  . OA (osteoarthritis)   . Pacemaker 02/08/2014   followed by dr g. taylor--  single chamber Biotronik due to SSS  . Permanent atrial fibrillation ([place])   . Pneumonia 2019   Left lung  . Prostate cancer [facility]) urologist-  dr Diona Fanti   dx 2004--  Gleason 8, PSA 10.45--  11-28-2002  s/p  radical prostatectomy;  recurrent w/ increasing PSA, started ADT treatment  . RBBB (right bundle branch block)   . Sick sinus syndrome ([place])    a-Flutter with episodes of bradycardia; S/P Biotronik (serial number [id number]) 02-08-2014  . Urinary incontinence   . Wears hearing aid in right ear    receiver and transmitter    Past Surgical History:  Procedure Laterality Date  . APPENDECTOMY    . BACK SURGERY     disk  . CARDIAC CATHETERIZATION  09-03-1999  dr Cathie Olden   abnormal cardiolite study:  minor luminal irregularities but no critial coronary artery  stenosis  . CARDIOVASCULAR STRESS TEST  08-18-2017  dr Cathie Olden   Intermediate risk nuclear study w/ large area inferior infarction, no evidence of ishcemia (consistant w/ prior MI)/  study not gated due to frequent PVCs  . CARPAL TUNNEL RELEASE Right 2000  . CARPAL TUNNEL RELEASE Left 11/19/2009  . CATARACT EXTRACTION Right 07/2015  . CATARACT EXTRACTION Left 09/2015  . CYSTOSCOPY N/A 11/04/2017   Procedure: CYSTOSCOPY AND CAUTERIZATION OF BLADDER;  Surgeon: Franchot Gallo, MD;  Location: [facility];  Service: Urology;  Laterality: N/A;  . INSERTION PENILE PROSTHESIS  02-22-2004    dr Mattie Marlin  [facility]  . IR THORACENTESIS ASP PLEURAL SPACE W/IMG GUIDE  11/26/2017  . KNEE ARTHROSCOPY  Left 07/2010  . PERMANENT PACEMAKER INSERTION N/A 02/08/2014   Procedure: PERMANENT PACEMAKER INSERTION;  Surgeon: Evans Lance, MD;  Location: [facility] CATH LAB;  Service: Cardiovascular;  Laterality: N/A;  . PLEURAL EFFUSION DRAINAGE Left 11/30/2017   Procedure: DRAINAGE OF HEMOTHORAX;  Surgeon: Ivin Poot, MD;  Location: [facility];  Service: Thoracic;  Laterality: Left;  . RADICAL RETROPUBIC PROSTATECTOMY W/ BILATERAL PELVIC LYMPH NODE DISSECTION  11-28-2002   dr Mattie Marlin  [facility]  . TONSILLECTOMY    . TOTAL HIP ARTHROPLASTY Left 05/12/2016   Procedure: LEFT TOTAL HIP ARTHROPLASTY ANTERIOR APPROACH;  Surgeon: Paralee Cancel, MD;  Location: [facility];  Service: Orthopedics;  Laterality: Left;  . TOTAL HIP ARTHROPLASTY Right 07-15-2006   dr Alvan Dame  [facility]  . TRANSTHORACIC ECHOCARDIOGRAM  07/20/2017   mild LVH, ef 45-50%, hypokinesis of the basal-midinferior myocardium, due to AFib unable to evaluate diastolic function/  severe LAE and RAE/  trivial PR and TR  . VIDEO ASSISTED THORACOSCOPY Left 11/30/2017   Procedure: VIDEO ASSISTED THORACOSCOPY;  Surgeon: Ivin Poot, MD;  Location: [facility];  Service: Thoracic;  Laterality: Left;  [name] VIDEO ASSISTED THORACOSCOPY (VATS)/EMPYEMA Left 11/29/2017   Procedure: VIDEO ASSISTED THORACOSCOPY (VATS)/EMPYEMA;  Surgeon: Ivin Poot, MD;  Location: [facility];  Service: Thoracic;  Laterality: Left;    There were no vitals filed for this visit.  Subjective Assessment - 02/16/18 0938    Subjective  The patient notes some home exercises are easy.  No changes since last session.    Patient Stated Goals  work on balance, "be able to walk, get off the walker (I'll live with a cane)".  Patient has used the walker since January when he fell.    Currently in Pain?  No/denies         [facility] PT Assessment - 02/16/18 1009      Timed Up and Go Test   Normal TUG (seconds)  --   14.31 seconds                  [facility] Adult PT Treatment/Exercise - 02/17/18 0001       Ambulation/Gait   Ambulation/Gait  Yes    Ambulation/Gait Assistance  6: Modified independent (Device/Increase time);4: Min assist;4: Min guard    Ambulation/Gait Assistance Details  Mod indep with RW and CGA to min A with SPC    Ambulation Distance (Feet)  230 Feet   with SPC and then 150 x 2 with RW   Assistive device  Rolling walker;Straight cane    Gait Pattern  Step-through pattern;Decreased arm swing - left;Decreased stance time - right;Decreased stride length;Lateral hip instability;Trunk flexed;Wide base of support    Ambulation Surface  Level    Gait Comments  Patient had one  episode of foot drag and used RW to catch balance to recover.  We discussed that this hinders him walking with SPC in home.  In parallel bars performed gait activities dec'ing UE support with m in A.      Neuro Re-ed    Neuro Re-ed Details   Standing corner balance exercise with narrowing base of support for HEP.        Exercises   Exercises  Other Exercises    Other Exercises   Reviewed ALL prior HEP (see handouts) and consolidated to HEP to include heel raises/toe raises at countertop dec'ing UE support, Standing hip abduction with theraband with correction on technique, seated ankle eversion with theraband.  Also performed heel cord stretch.   Step ups to 4" step and step downs dec'ing UE support.  Sidestepping for hip abduction.              PT Education - 02/17/18 1204    Education Details  PT modified HEP/ consolidated from prior HEP    Person(s) Educated  Patient    Methods  Explanation;Handout    Comprehension  Verbalized understanding       PT Short Term Goals - 02/16/18 1010      PT SHORT TERM GOAL #1   Title  The patient will return demo HEP for gaze adaptation, hip flexor strengthening, balance and general mobility.  (STG goals due 02/17/18)    Baseline  Has HEP for LE strength, balance and mobility.  Held gaze until after eye doctor appt    Time  4    Period  Weeks    Status   Partially Met      PT SHORT TERM GOAL #2   Title  The patient will improve Berg from 28/56 to > or equal to 34/56 to demonstrate improving steady state balance without UE support.    Baseline  Improved to 43/56    Time  4    Period  Weeks    Status  Achieved      PT SHORT TERM GOAL #3   Title  The patient will improve TUG with RW from 17.47 seconds to < or equal to 14 seconds to demo improving functional mobility and dec'ing risk for falls.    Baseline  IMproved to 14.31 seconds    Time  4    Period  Weeks    Status  Partially Met      PT SHORT TERM GOAL #4   Title  The patient will perform sit<>stand without UE support to demo improved functional strength in LEs.    Time  4    Period  Weeks    Status  Achieved      PT SHORT TERM GOAL #5   Title  The patient will improve gait speed from 2.31 ft/sec to > or equal to 2.62 ft/sec to transition to "full community ambulator" classification of gait.    Baseline  2.43 ft/sec measured on 02/14/2018    Time  4    Period  Weeks    Status  Partially Met        PT Long Term Goals - 02/17/18 1206      PT LONG TERM GOAL #1   Title  The patient will return demo progression of HEP and ongoing wellness plan.  (LTGs due date: 03/19/18)    Time  8    Period  Weeks      PT LONG TERM GOAL #2   Title  The patient will improve Berg score from 28/56 to > or equal to 46/56 to demo dec'ing risk for falls.    Baseline  43/56 at STG assessment.    Time  8    Period  Weeks    Status  Revised      PT LONG TERM GOAL #3   Title  The patient will perform 5 time sit<>stand without UE support in < or equal to 14 seconds.    Time  8    Period  Weeks      PT LONG TERM GOAL #4   Title  The patient will ambulate short community distances with least restrictive device mod indep (x 500 ft).    Time  8    Period  Weeks      PT LONG TERM GOAL #5   Title  The patient will negotiate household surfaces with SPC (if appropriate as he progresses) mod indep x  300 ft.    Time  8    Period  Weeks            Plan - 02/17/18 1216    Clinical Impression Statement  The patient has partially met STGs and PT updated LT goal for Berg based on progress.  PT encouraging continued RW use due to fall risk with SPC.  Patient and wife agree.      PT Treatment/Interventions  ADLs/Self Care Home Management;DME Instruction;Gait training;Stair training;Functional mobility training;Therapeutic exercise;Therapeutic activities;Balance training;Neuromuscular re-education;Patient/family education;Vestibular;Canalith Repostioning    PT Next Visit Plan   SPC only in PT session not a home. Check gaze x 1 sitting vs standing with UE support, Dynamic balance activities (compliant surfaces, head motion, turns, etc).  Gait with RW, transitional movements.      Consulted and Agree with Plan of Care  Patient;Family member/caregiver    Family Member Consulted  spouse, Opal Sidles       Patient will benefit from skilled therapeutic intervention in order to improve the following deficits and impairments:  Abnormal gait, Decreased activity tolerance, Decreased balance, Decreased endurance, Decreased coordination, Decreased knowledge of use of DME, Decreased mobility, Decreased strength, Postural dysfunction  Visit Diagnosis: Other abnormalities of gait and mobility  Unsteadiness on feet  Muscle weakness (generalized)  Repeated falls     Problem List Patient Active Problem List   Diagnosis Date Noted  . Encounter for therapeutic drug monitoring 12/20/2017  . S/P thoracotomy 11/29/2017  . Sepsis ([place])   . Hypoxemia   . Pneumonia of left lower lobe due to infectious organism ([place])   . Mediastinal lymphadenopathy   . Chronic cough 11/23/2017  . CLL (chronic lymphocytic leukemia) ([place]) 11/23/2017  . Pleural effusion 11/23/2017  . HCAP (healthcare-associated pneumonia) 11/20/2017  . Anemia 11/20/2017  . Ischemic cardiomyopathy 09/01/2017  . Right hip pain   . Acute  otitis media   . Chronic systolic CHF (congestive heart failure) ([place])   . Pressure injury of skin 07/20/2017  . Malignant otitis externa 07/19/2017  . Fall at home, initial encounter 07/19/2017  . S/P left THA, AA 05/12/2016  . LPRD (laryngopharyngeal reflux disease) 05/21/2015  . Other allergic rhinitis 05/21/2015  . Obesity 12/12/2014  . Upper airway cough syndrome 12/11/2014  . Cough variant asthma 07/27/2014  . Wheezing 06/20/2014  . Symptomatic bradycardia - s/p Biotronik (serial number [id number]) 02/08/2014  . Diabetes mellitus type 2, noninsulin dependent ([place])   . Edema of foot 03/06/2013  . Traumatic ecchymosis of right foot 03/06/2013  . Bone spur 12/30/2012  .  Pain of toe of left foot 12/30/2012  . Hyperlipidemia 07/13/2012  . HTN (hypertension) 04/12/2012  . Sick sinus syndrome ([place]) 08/17/2011  . PAF (paroxysmal atrial fibrillation) ([place]) 09/16/2010    Hezekiah Veltre, PT 02/17/2018, 12:17 PM  [facility] [address] [place], [name], [address] Phone: [phone]   Fax:  [phone]  Name: Oscar Baker MRN: [id number] Date of Birth: 05-22-1933

## 2018-02-22 ENCOUNTER — Encounter: Payer: Self-pay | Admitting: Physical Therapy

## 2018-02-22 ENCOUNTER — Ambulatory Visit: Payer: Medicare Other | Attending: Internal Medicine | Admitting: Physical Therapy

## 2018-02-22 DIAGNOSIS — R296 Repeated falls: Secondary | ICD-10-CM | POA: Insufficient documentation

## 2018-02-22 DIAGNOSIS — R2681 Unsteadiness on feet: Secondary | ICD-10-CM | POA: Diagnosis not present

## 2018-02-22 DIAGNOSIS — M6281 Muscle weakness (generalized): Secondary | ICD-10-CM | POA: Diagnosis not present

## 2018-02-22 DIAGNOSIS — R2689 Other abnormalities of gait and mobility: Secondary | ICD-10-CM | POA: Insufficient documentation

## 2018-02-22 NOTE — Therapy (Signed)
[facility] [address] [place], [name], [address] Phone: [phone]   Fax:  [phone]  Physical Therapy Treatment  Patient Details  Name: Oscar Baker MRN: [id number] Date of Birth: 1932-11-03 Referring Provider: unsteadiness on feet, muscle weakness   Encounter Date: 02/22/2018  This tenth visit progress note covers the period: 01/18/18 to 02/22/18  PT End of Session - 02/22/18 0931    Visit Number  10    Number of Visits  17    Date for PT Re-Evaluation  03/19/18    Authorization Type  Medicare & BCBS supplement    Authorization Time Period  medicare guidelines with medical necessity; 100% coverage    PT Start Time  0846    PT Stop Time  0928    PT Time Calculation (min)  42 min    Equipment Utilized During Treatment  Gait belt    Activity Tolerance  Patient tolerated treatment well    Behavior During Therapy  [facility] for tasks assessed/performed       Past Medical History:  Diagnosis Date  . Asthma 1950's   history of  . Atrial fibrillation ([place])   . Bilateral carpal tunnel syndrome   . Bilateral lower extremity edema   . Bladder tumor   . Chronic systolic heart failure (HCC)    Echo 1/19: Mild LVH, EF 45-50, inf HK, MAC, severe LAE, severe RAE // Echo 7/15: Mild LVH, mod focal basal sept hypertrophy, EF 55-60, AV peak and mean 16/9, trivial MR, mod LAE, PASP 38  . CLL (chronic lymphocytic leukemia) [facility]) oncologist-  dr Ilene Qua--   dx [phone] ;  Lymphocytosis, CLL - per lov note 05-11-2017 currently under active survillance,  CT 04-17-2014 show very small lymphadenopathy, no indication for treatment  . Coronary artery disease    cardiologist-  dr Cathie Olden--  08-18-2017 Intermittant risk nuclear study w/ large area of inferior infartion with no evidence ishcemia   . 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 Diabetes mellitus type 2, noninsulin dependent ([place])   . Elevated PSA    since prostatectomy but now resolved  .  Hematuria 04/2017  . History of ear infection    Right  . History of MI (myocardial infarction)    per myoview nuclear study 08-18-2017 , unknown when  . History of shingles 08/2017   L ear and scalp, possible  . Hyperlipidemia   . Hypertension   . Ischemic cardiomyopathy 09/01/2017   Presumed +CAD with Nuclear stress test 08/18/17 - Inferior scar, no ischemia, intermediate risk // med management unless +angina or worse dyspnea  . OA (osteoarthritis)   . Pacemaker 02/08/2014   followed by dr g. taylor--  single chamber Biotronik due to SSS  . Permanent atrial fibrillation ([place])   . Pneumonia 2019   Left lung  . Prostate cancer [facility]) urologist-  dr Diona Fanti   dx 2004--  Gleason 8, PSA 10.45--  11-28-2002  s/p  radical prostatectomy;  recurrent w/ increasing PSA, started ADT treatment  . RBBB (right bundle branch block)   . Sick sinus syndrome ([place])    a-Flutter with episodes of bradycardia; S/P Biotronik (serial number [id number]) 02-08-2014  . Urinary incontinence   . Wears hearing aid in right ear    receiver and transmitter    Past Surgical History:  Procedure Laterality Date  . APPENDECTOMY    . BACK SURGERY     disk  . CARDIAC CATHETERIZATION  09-03-1999  dr Cathie Olden  abnormal cardiolite study:  minor luminal irregularities but no critial coronary artery stenosis  . CARDIOVASCULAR STRESS TEST  08-18-2017  dr Cathie Olden   Intermediate risk nuclear study w/ large area inferior infarction, no evidence of ishcemia (consistant w/ prior MI)/  study not gated due to frequent PVCs  . CARPAL TUNNEL RELEASE Right 2000  . CARPAL TUNNEL RELEASE Left 11/19/2009  . CATARACT EXTRACTION Right 07/2015  . CATARACT EXTRACTION Left 09/2015  . CYSTOSCOPY N/A 11/04/2017   Procedure: CYSTOSCOPY AND CAUTERIZATION OF BLADDER;  Surgeon: Franchot Gallo, MD;  Location[facility];  Service: Urology;  Laterality: N/A;  . INSERTION PENILE PROSTHESIS  02-22-2004    dr Mattie Marlin  [facility]  . IR  THORACENTESIS ASP PLEURAL SPACE W/IMG GUIDE  11/26/2017  . KNEE ARTHROSCOPY Left 07/2010  . PERMANENT PACEMAKER INSERTION N/A 02/08/2014   Procedure: PERMANENT PACEMAKER INSERTION;  Surgeon: Evans Lance, MD;  Location: [facility] CATH LAB;  Service: Cardiovascular;  Laterality: N/A;  . PLEURAL EFFUSION DRAINAGE Left 11/30/2017   Procedure: DRAINAGE OF HEMOTHORAX;  Surgeon: Ivin Poot, MD;  Location: [place];  Service: Thoracic;  Laterality: Left;  . RADICAL RETROPUBIC PROSTATECTOMY W/ BILATERAL PELVIC LYMPH NODE DISSECTION  11-28-2002   dr Mattie Marlin  [facility]  . TONSILLECTOMY    . TOTAL HIP ARTHROPLASTY Left 05/12/2016   Procedure: LEFT TOTAL HIP ARTHROPLASTY ANTERIOR APPROACH;  Surgeon: Paralee Cancel, MD;  Location: [facility];  Service: Orthopedics;  Laterality: Left;  . TOTAL HIP ARTHROPLASTY Right 07-15-2006   dr Alvan Dame  [facility]  . TRANSTHORACIC ECHOCARDIOGRAM  07/20/2017   mild LVH, ef 45-50%, hypokinesis of the basal-midinferior myocardium, due to AFib unable to evaluate diastolic function/  severe LAE and RAE/  trivial PR and TR  . VIDEO ASSISTED THORACOSCOPY Left 11/30/2017   Procedure: VIDEO ASSISTED THORACOSCOPY;  Surgeon: Ivin Poot, MD;  Location: [place];  Service: Thoracic;  Laterality: Left;  [name] VIDEO ASSISTED THORACOSCOPY (VATS)/EMPYEMA Left 11/29/2017   Procedure: VIDEO ASSISTED THORACOSCOPY (VATS)/EMPYEMA;  Surgeon: Ivin Poot, MD;  Location: [place] [facility];  Service: Thoracic;  Laterality: Left;    There were no vitals filed for this visit.  Subjective Assessment - 02/22/18 0850    Subjective  No falls, no changes. Doing his exercises. cannot do the VORx1 exercise as he still sees double when trying to do it.     Patient Stated Goals  work on balance, "be able to walk, get off the walker (I'll live with a cane)".  Patient has used the walker since January when he fell.    Currently in Pain?  No/denies                       [facility] Adult PT Treatment/Exercise - 02/22/18 0910       Transfers   Sit to Stand  5: Supervision;4: Min guard    Sit to Stand Details (indicate cue type and reason)  minguard with SPC    Stand to Sit  5: Supervision;4: Min guard    Stand to Sit Details  with SPC vs RW      Ambulation/Gait   Ambulation/Gait Assistance  4: Min assist    Ambulation/Gait Assistance Details  staggering LOB to left x 3; drifts to his rt    Ambulation Distance (Feet)  500 Feet    Assistive device  Straight cane    Gait Pattern  Step-through pattern;Decreased arm swing - left;Decreased stance time - right;Decreased stride length;Lateral  hip instability;Trunk flexed;Wide base of support    Ambulation Surface  Level;Unlevel;Outdoor;Paved   slopes   Ramp  4: Min assist    Ramp Details (indicate cue type and reason)  minguard for safety; pt spontaneously shifts weight appropriately to reduce risk of falling (forward up ramp; back on heels descending)    Curb  4: Min assist    Curb Details (indicate cue type and reason)  vc for sequencing x 2 of 3 reps    Gait Comments  no dragging of feet noted; +drift and stagger      Vestibular Treatment/Exercise - 02/22/18 1342      Vestibular Treatment/Exercise   Vestibular Treatment Provided  Gaze    Gaze Exercises  X1 Viewing Horizontal      X1 Viewing Horizontal   Foot Position  standing 4 ft from target, light UE support vs none    Time  --   30, 15, <5   Reps  4    Comments  pt able to complete exercise 1st rep with slow head turns and only has double vision if he turns his head beyond 30 degrees to his left. Intructed to do exercise staying within the range that is clear and not doubled; on consecutive reps, this time became shorter and shorter before the target either moved or doubled         Balance Exercises - 02/22/18 1348      Balance Exercises: Standing   SLS  Eyes open;Intermittent upper extremity support;10 secs    SLS with Vectors  Solid surface   cane: alternat'g heels to foam bubbles; toe tap cones    Retro Gait  Upper extremity support;4 reps   EC at counter   Sidestepping  4 reps   cane vs no UE support   Marching Limitations  forward march along counter    Other Standing Exercises  weaving through and around cones using SPC          PT Short Term Goals - 02/16/18 1010      PT SHORT TERM GOAL #1   Title  The patient will return demo HEP for gaze adaptation, hip flexor strengthening, balance and general mobility.  (STG goals due 02/17/18)    Baseline  Has HEP for LE strength, balance and mobility.  Held gaze until after eye doctor appt    Time  4    Period  Weeks    Status  Partially Met      PT SHORT TERM GOAL #2   Title  The patient will improve Berg from 28/56 to > or equal to 34/56 to demonstrate improving steady state balance without UE support.    Baseline  Improved to 43/56    Time  4    Period  Weeks    Status  Achieved      PT SHORT TERM GOAL #3   Title  The patient will improve TUG with RW from 17.47 seconds to < or equal to 14 seconds to demo improving functional mobility and dec'ing risk for falls.    Baseline  IMproved to 14.31 seconds    Time  4    Period  Weeks    Status  Partially Met      PT SHORT TERM GOAL #4   Title  The patient will perform sit<>stand without UE support to demo improved functional strength in LEs.    Time  4    Period  Weeks    Status  Achieved  PT SHORT TERM GOAL #5   Title  The patient will improve gait speed from 2.31 ft/sec to > or equal to 2.62 ft/sec to transition to "full community ambulator" classification of gait.    Baseline  2.43 ft/sec measured on 02/14/2018    Time  4    Period  Weeks    Status  Partially Met        PT Long Term Goals - 02/17/18 1206      PT LONG TERM GOAL #1   Title  The patient will return demo progression of HEP and ongoing wellness plan.  (LTGs due date: 03/19/18)    Time  8    Period  Weeks      PT LONG TERM GOAL #2   Title  The patient will improve Berg score from 28/56 to > or  equal to 46/56 to demo dec'ing risk for falls.    Baseline  43/56 at STG assessment.    Time  8    Period  Weeks    Status  Revised      PT LONG TERM GOAL #3   Title  The patient will perform 5 time sit<>stand without UE support in < or equal to 14 seconds.    Time  8    Period  Weeks      PT LONG TERM GOAL #4   Title  The patient will ambulate short community distances with least restrictive device mod indep (x 500 ft).    Time  8    Period  Weeks      PT LONG TERM GOAL #5   Title  The patient will negotiate household surfaces with SPC (if appropriate as he progresses) mod indep x 300 ft.    Time  8    Period  Weeks            Plan - 02/22/18 1352    Clinical Impression Statement  Session focused on ambulation with cane for increased balance challenge and assess his safety with cane (continues to need min assist due to losses of balance). Balance training with little to no UE support at counter.     PT Frequency  2x / week    PT Duration  8 weeks    PT Treatment/Interventions  ADLs/Self Care Home Management;DME Instruction;Gait training;Stair training;Functional mobility training;Therapeutic exercise;Therapeutic activities;Balance training;Neuromuscular re-education;Patient/family education;Vestibular;Canalith Repostioning    PT Next Visit Plan   SPC only in PT session not a home. Check gaze x 1 standing with/without UE support, Dynamic balance activities (compliant surfaces, head motion, turns, etc).  Gait with RW, transitional movements.      Consulted and Agree with Plan of Care  Patient;Family member/caregiver    Family Member Consulted  spouse, Opal Sidles       Patient will benefit from skilled therapeutic intervention in order to improve the following deficits and impairments:  Abnormal gait, Decreased activity tolerance, Decreased balance, Decreased endurance, Decreased coordination, Decreased knowledge of use of DME, Decreased mobility, Decreased strength, Postural  dysfunction  Visit Diagnosis: Other abnormalities of gait and mobility  Unsteadiness on feet     Problem List Patient Active Problem List   Diagnosis Date Noted  . Encounter for therapeutic drug monitoring 12/20/2017  . S/P thoracotomy 11/29/2017  . Sepsis ([place])   . Hypoxemia   . Pneumonia of left lower lobe due to infectious organism ([place])   . Mediastinal lymphadenopathy   . Chronic cough 11/23/2017  . CLL (chronic lymphocytic leukemia) ([place]) 11/23/2017  .  Pleural effusion 11/23/2017  . HCAP (healthcare-associated pneumonia) 11/20/2017  . Anemia 11/20/2017  . Ischemic cardiomyopathy 09/01/2017  . Right hip pain   . Acute otitis media   . Chronic systolic CHF (congestive heart failure) ([place])   . Pressure injury of skin 07/20/2017  . Malignant otitis externa 07/19/2017  . Fall at home, initial encounter 07/19/2017  . S/P left THA, AA 05/12/2016  . LPRD (laryngopharyngeal reflux disease) 05/21/2015  . Other allergic rhinitis 05/21/2015  . Obesity 12/12/2014  . Upper airway cough syndrome 12/11/2014  . Cough variant asthma 07/27/2014  . Wheezing 06/20/2014  . Symptomatic bradycardia - s/p Biotronik (serial number [id number]) 02/08/2014  . Diabetes mellitus type 2, noninsulin dependent ([place])   . Edema of foot 03/06/2013  . Traumatic ecchymosis of right foot 03/06/2013  . Bone spur 12/30/2012  . Pain of toe of left foot 12/30/2012  . Hyperlipidemia 07/13/2012  . HTN (hypertension) 04/12/2012  . Sick sinus syndrome ([place]) 08/17/2011  . PAF (paroxysmal atrial fibrillation) ([place]) 09/16/2010    Rexanne Mano, PT 02/22/2018, 1:56 PM  [facility] [address] [place], [name], [address] Phone: [phone]   Fax:  [phone]  Name: Oscar Baker MRN: [id number] Date of Birth: 05/28/1933

## 2018-02-23 ENCOUNTER — Telehealth: Payer: Self-pay | Admitting: Pulmonary Disease

## 2018-02-23 ENCOUNTER — Encounter: Payer: Self-pay | Admitting: Pulmonary Disease

## 2018-02-23 ENCOUNTER — Ambulatory Visit (INDEPENDENT_AMBULATORY_CARE_PROVIDER_SITE_OTHER): Payer: Medicare Other | Admitting: Pulmonary Disease

## 2018-02-23 ENCOUNTER — Ambulatory Visit (INDEPENDENT_AMBULATORY_CARE_PROVIDER_SITE_OTHER)
Admission: RE | Admit: 2018-02-23 | Discharge: 2018-02-23 | Disposition: A | Discharge status: Home / Self Care | Payer: Medicare Other | Source: Ambulatory Visit | Attending: Pulmonary Disease | Admitting: Pulmonary Disease

## 2018-02-23 VITALS — BP 94/62 | HR 76 | Ht 70.5 in | Wt 205.0 lb

## 2018-02-23 DIAGNOSIS — J181 Lobar pneumonia, unspecified organism: Secondary | ICD-10-CM

## 2018-02-23 DIAGNOSIS — H1033 Unspecified acute conjunctivitis, bilateral: Secondary | ICD-10-CM | POA: Diagnosis not present

## 2018-02-23 DIAGNOSIS — J189 Pneumonia, unspecified organism: Secondary | ICD-10-CM

## 2018-02-23 NOTE — Progress Notes (Signed)
_0  ID: Oscar Baker, male    DOB: Apr 20, 1933, 84 y.o.   MRN: [id number]   Chief complaint: Chronic cough Cough is felt to be related to aspiration  Referring provider: [name], MD  HPI: 84 yo male former smoker (mainly pipe smoker )  followed for chronic cough  and sinus disease  last seen in office 2016 . Seen by PCCM during hospitalization 11/2017 with Bacteremia and Empyema .  PMH significant for CHF /CM , A Fib , Pacemaker ,CLL  02/23/2018  Protracted cough Feels cough is a little bit better Continues to take [employer], Mucinex intermittently Not really coughing up significant secretions Will cough up yellow secretions in the morning, clear secretions in the evening Has not been feeling poorly Has no fevers or chills Denies any chest pains or discomfort Using a flutter device has not really helped clearance of secretion  Significant history:  Patient had a complicated recent medical history.  Patient fell earlier in 2019.  He had a left lower lobe pneumonia.  He required placement in a skilled nursing facility.  He was treated with antibiotics.  But developed a progressive left pleural effusion which was quite extensive into the entire left pleural space.  Patient was seen by thoracic surgery and underwent thoracoscopy for empyema .patient had postop complications and required reexploration on June 11 to remove clots in the pleural space.  A left VATS with decortication.  Found to have H influenza bacteremia felt from a pulmonary source.  Was treated with aggressive IV antibiotics. Since discharge patient is feeling better, getting stronger, is now back home. Using walker.  Has finished PT at home , going to go to OP PT.  Seen by TS recently, CXR is improved with small round pleural denisty possible loculated effusion .  He is not coughing up discolored mucus says he feels good. No fever or weight loss.   Wife says he does not follow aspiration diet . Found to be at  risk for aspiration and rec on D 3 diet with nectar thick liquids .  He is trying to follow aspiration precautions, difficult.   Allergies  Allergen Reactions  . Altace [Ramipril] Other (See Comments)    "throat felt like had a knot in it"  . Codeine Nausea And Vomiting    Nausea and vomiting   . Simvastatin Other (See Comments)    Leg aches    Immunization History  Administered Date(s) Administered  . Tdap 07/19/2017    Past Medical History:  Diagnosis Date  . Asthma 1950's   history of  . Atrial fibrillation ([place])   . Bilateral carpal tunnel syndrome   . Bilateral lower extremity edema   . Bladder tumor   . Chronic systolic heart failure (HCC)    Echo 1/19: Mild LVH, EF 45-50, inf HK, MAC, severe LAE, severe RAE // Echo 7/15: Mild LVH, mod focal basal sept hypertrophy, EF 55-60, AV peak and mean 16/9, trivial MR, mod LAE, PASP 38  . CLL (chronic lymphocytic leukemia) [facility]) oncologist-  dr Ilene Qua--   dx [phone] ;  Lymphocytosis, CLL - per lov note 05-11-2017 currently under active survillance,  CT 04-17-2014 show very small lymphadenopathy, no indication for treatment  . Coronary artery disease    cardiologist-  dr Cathie Olden--  08-18-2017 Intermittant risk nuclear study w/ large area of inferior infartion with no evidence ishcemia   . 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 Diabetes mellitus type 2, noninsulin dependent ([place])   .  Elevated PSA    since prostatectomy but now resolved  . Hematuria 04/2017  . History of ear infection    Right  . History of MI (myocardial infarction)    per myoview nuclear study 08-18-2017 , unknown when  . History of shingles 08/2017   L ear and scalp, possible  . Hyperlipidemia   . Hypertension   . Ischemic cardiomyopathy 09/01/2017   Presumed +CAD with Nuclear stress test 08/18/17 - Inferior scar, no ischemia, intermediate risk // med management unless +angina or worse dyspnea  . OA (osteoarthritis)   . Pacemaker 02/08/2014   followed by dr g. taylor--   single chamber Biotronik due to SSS  . Permanent atrial fibrillation ([place])   . Pneumonia 2019   Left lung  . Prostate cancer [facility]) urologist-  dr Diona Fanti   dx 2004--  Gleason 8, PSA 10.45--  11-28-2002  s/p  radical prostatectomy;  recurrent w/ increasing PSA, started ADT treatment  . RBBB (right bundle branch block)   . Sick sinus syndrome ([place])    a-Flutter with episodes of bradycardia; S/P Biotronik (serial number [id number]) 02-08-2014  . Urinary incontinence   . Wears hearing aid in right ear    receiver and transmitter    Tobacco History: Social History   Tobacco Use  Smoking Status Former Smoker  . Years: 25.00  . Types: Pipe, Cigars  . Last attempt to quit: 11/25/1975  . Years since quitting: 42.2  Smokeless Tobacco Never Used    Outpatient Medications Prior to Visit  Medication Sig Dispense Refill  . acetaminophen (TYLENOL) 500 MG tablet Take 2 tablets (1,000 mg total) by mouth every 6 (six) hours. 30 tablet 0  . atorvastatin (LIPITOR) 20 MG tablet TAKE 1 TABLET BY MOUTH DAILY AT 6PM 90 tablet 0  . benzonatate (TESSALON) 200 MG capsule Take 1 capsule (200 mg total) by mouth 3 (three) times daily as needed for cough. 90 capsule 0  . fenofibrate micronized (LOFIBRA) 200 MG capsule TAKE 1 CAPSULE BY MOUTH DAILY BEFORE BREAKFAST 90 capsule 3  . furosemide (LASIX) 20 MG tablet Take 1 tablet (20 mg total) by mouth daily. 90 tablet 3  . guaiFENesin (MUCINEX) 600 MG 12 hr tablet Take by mouth 2 (two) times daily.    . metFORMIN (GLUCOPHAGE) 1000 MG tablet Take 1,000 mg by mouth 2 (two) times daily with a meal.      . multivitamin (THERAGRAN) per tablet Take 1 tablet by mouth daily. Will stop prior to procedure    . potassium chloride (K-DUR) 10 MEQ tablet Take 1 tablet (10 mEq total) by mouth daily. 90 tablet 3  . sitaGLIPtin (JANUVIA) 100 MG tablet Take 100 mg by mouth daily.    . vitamin B-12 (CYANOCOBALAMIN) 1000 MCG tablet Take 1,000 mcg by mouth daily. Will stop prior to  procedure    . warfarin (COUMADIN) 5 MG tablet TAKE AS DIRECTED BY COUMADIN CLINIC 40 tablet 3  . carvedilol (COREG) 3.125 MG tablet Take 1 tablet (3.125 mg total) by mouth 2 (two) times daily. 180 tablet 3   No facility-administered medications prior to visit.    Review of Systems  Constitutional: Negative.   HENT: Negative.   Respiratory: Positive for cough and sputum production.   Gastrointestinal: Negative.   Genitourinary: Positive for frequency and urgency.  Musculoskeletal: Negative.   Neurological: Negative.   Endo/Heme/Allergies: Negative.   Psychiatric/Behavioral: Negative.    Physical Exam  HENT:  Head: Normocephalic and atraumatic.  Eyes: Conjunctivae are normal.  Right eye exhibits no discharge. Left eye exhibits no discharge.  Neck: Normal range of motion. Neck supple. No tracheal deviation present. No thyromegaly present.  Cardiovascular: Normal rate and regular rhythm.  Pulmonary/Chest: Effort normal and breath sounds normal. No respiratory distress. He has no wheezes.  Abdominal: Soft. Bowel sounds are normal.    BP 94/62 (BP Location: Left Arm, Patient Position: Sitting, Cuff Size: Normal)   Pulse 76   Ht 5' 10.5" (1.791 m)   Wt 205 lb (93 kg)   SpO2 95%   BMI 29.00 kg/m    Imaging: Chest x-ray reviewed by myself showing no acute infiltrate, pleural thickening on the left No results found.    Assessment & Plan:  .  Aspiration pneumonia recently-resolved  .  Cough  .  Normal chest x-ray showing change at the left base   Plan: .  Continue aspiration precautions  .  Continue medications for cough  .  Encouraged to stay active  .  I will follow-up with him in about 6 months  .  Encouraged to call if any significant changes in symptoms   Laurin Coder, MD 02/23/2018

## 2018-02-23 NOTE — Telephone Encounter (Signed)
Call patient  Chest x-ray continues to improve, chronic change at the left base  No evidence of an acute infection/pneumonia  We will follow-up as scheduled

## 2018-02-23 NOTE — Patient Instructions (Signed)
Persistent cough Recent pneumonia  Aspiration pneumonia  Dietary modification  I will see you back in about 3 months We will repeat a chest x-ray today to follow-up on pneumonia  Encouraged to call if any significant changes in symptoms

## 2018-02-24 ENCOUNTER — Ambulatory Visit: Payer: Medicare Other | Admitting: Physical Therapy

## 2018-02-24 DIAGNOSIS — R2681 Unsteadiness on feet: Secondary | ICD-10-CM

## 2018-02-24 DIAGNOSIS — R2689 Other abnormalities of gait and mobility: Secondary | ICD-10-CM

## 2018-02-24 DIAGNOSIS — R296 Repeated falls: Secondary | ICD-10-CM | POA: Diagnosis not present

## 2018-02-24 DIAGNOSIS — M6281 Muscle weakness (generalized): Secondary | ICD-10-CM | POA: Diagnosis not present

## 2018-02-24 NOTE — Patient Instructions (Signed)
SINGLE LIMB STANCE    Stance: single leg on floor. Raise leg. Hold __10_ seconds. Repeat with other leg. _2__ reps per set, __2_ sets per day, __5_ days per week  Copyright  VHI. All rights reserved.   

## 2018-02-25 ENCOUNTER — Encounter: Payer: Self-pay | Admitting: Physical Therapy

## 2018-02-25 NOTE — Therapy (Signed)
[facility] [address] [place], [name], [address] Phone: [phone]   Fax:  [phone]  Physical Therapy Treatment  Patient Details  Name: Oscar Baker MRN: [id number] Date of Birth: 04/12/1933 Referring Provider: unsteadiness on feet, muscle weakness   Encounter Date: 02/24/2018  PT End of Session - 02/25/18 1027    Visit Number  11    Number of Visits  17    Date for PT Re-Evaluation  03/19/18    Authorization Type  Medicare & BCBS supplement    PT Start Time  0847    PT Stop Time  0931    PT Time Calculation (min)  44 min    Equipment Utilized During Treatment  Gait belt       Past Medical History:  Diagnosis Date  . Asthma 1950's   history of  . Atrial fibrillation ([place])   . Bilateral carpal tunnel syndrome   . Bilateral lower extremity edema   . Bladder tumor   . Chronic systolic heart failure (HCC)    Echo 1/19: Mild LVH, EF 45-50, inf HK, MAC, severe LAE, severe RAE // Echo 7/15: Mild LVH, mod focal basal sept hypertrophy, EF 55-60, AV peak and mean 16/9, trivial MR, mod LAE, PASP 38  . CLL (chronic lymphocytic leukemia) [facility]) oncologist-  dr Ilene Qua--   dx [phone] ;  Lymphocytosis, CLL - per lov note 05-11-2017 currently under active survillance,  CT 04-17-2014 show very small lymphadenopathy, no indication for treatment  . Coronary artery disease    cardiologist-  dr Cathie Olden--  08-18-2017 Intermittant risk nuclear study w/ large area of inferior infartion with no evidence ishcemia   . 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 Diabetes mellitus type 2, noninsulin dependent ([place])   . Elevated PSA    since prostatectomy but now resolved  . Hematuria 04/2017  . History of ear infection    Right  . History of MI (myocardial infarction)    per myoview nuclear study 08-18-2017 , unknown when  . History of shingles 08/2017   L ear and scalp, possible  . Hyperlipidemia   . Hypertension   . Ischemic cardiomyopathy  09/01/2017   Presumed +CAD with Nuclear stress test 08/18/17 - Inferior scar, no ischemia, intermediate risk // med management unless +angina or worse dyspnea  . OA (osteoarthritis)   . Pacemaker 02/08/2014   followed by dr g. taylor--  single chamber Biotronik due to SSS  . Permanent atrial fibrillation ([place])   . Pneumonia 2019   Left lung  . Prostate cancer [facility]) urologist-  dr Diona Fanti   dx 2004--  Gleason 8, PSA 10.45--  11-28-2002  s/p  radical prostatectomy;  recurrent w/ increasing PSA, started ADT treatment  . RBBB (right bundle branch block)   . Sick sinus syndrome ([place])    a-Flutter with episodes of bradycardia; S/P Biotronik (serial number [id number]) 02-08-2014  . Urinary incontinence   . Wears hearing aid in right ear    receiver and transmitter    Past Surgical History:  Procedure Laterality Date  . APPENDECTOMY    . BACK SURGERY     disk  . CARDIAC CATHETERIZATION  09-03-1999  dr Cathie Olden   abnormal cardiolite study:  minor luminal irregularities but no critial coronary artery stenosis  . CARDIOVASCULAR STRESS TEST  08-18-2017  dr Cathie Olden   Intermediate risk nuclear study w/ large area inferior infarction, no evidence of ishcemia (consistant w/ prior MI)/  study not gated due  to frequent PVCs  . CARPAL TUNNEL RELEASE Right 2000  . CARPAL TUNNEL RELEASE Left 11/19/2009  . CATARACT EXTRACTION Right 07/2015  . CATARACT EXTRACTION Left 09/2015  . CYSTOSCOPY N/A 11/04/2017   Procedure: CYSTOSCOPY AND CAUTERIZATION OF BLADDER;  Surgeon: Franchot Gallo, MD;  Location: [facility];  Service: Urology;  Laterality: N/A;  . INSERTION PENILE PROSTHESIS  02-22-2004    dr Mattie Marlin  [facility]  . IR THORACENTESIS ASP PLEURAL SPACE W/IMG GUIDE  11/26/2017  . KNEE ARTHROSCOPY Left 07/2010  . PERMANENT PACEMAKER INSERTION N/A 02/08/2014   Procedure: PERMANENT PACEMAKER INSERTION;  Surgeon: Evans Lance, MD;  Location: [facility] CATH LAB;  Service: Cardiovascular;  Laterality: N/A;  .  PLEURAL EFFUSION DRAINAGE Left 11/30/2017   Procedure: DRAINAGE OF HEMOTHORAX;  Surgeon: Ivin Poot, MD;  Location: [facility];  Service: Thoracic;  Laterality: Left;  . RADICAL RETROPUBIC PROSTATECTOMY W/ BILATERAL PELVIC LYMPH NODE DISSECTION  11-28-2002   dr Mattie Marlin  [facility]  . TONSILLECTOMY    . TOTAL HIP ARTHROPLASTY Left 05/12/2016   Procedure: LEFT TOTAL HIP ARTHROPLASTY ANTERIOR APPROACH;  Surgeon: Paralee Cancel, MD;  Location: [facility];  Service: Orthopedics;  Laterality: Left;  . TOTAL HIP ARTHROPLASTY Right 07-15-2006   dr Alvan Dame  [facility]  . TRANSTHORACIC ECHOCARDIOGRAM  07/20/2017   mild LVH, ef 45-50%, hypokinesis of the basal-midinferior myocardium, due to AFib unable to evaluate diastolic function/  severe LAE and RAE/  trivial PR and TR  . VIDEO ASSISTED THORACOSCOPY Left 11/30/2017   Procedure: VIDEO ASSISTED THORACOSCOPY;  Surgeon: Ivin Poot, MD;  Location: [facility];  Service: Thoracic;  Laterality: Left;  [name] VIDEO ASSISTED THORACOSCOPY (VATS)/EMPYEMA Left 11/29/2017   Procedure: VIDEO ASSISTED THORACOSCOPY (VATS)/EMPYEMA;  Surgeon: Ivin Poot, MD;  Location: [facility];  Service: Thoracic;  Laterality: Left;    There were no vitals filed for this visit.  Subjective Assessment - 02/25/18 1016    Subjective  Pt states he has double vision - is unable to do the eye exercise (x1 viewing) given on Wed. for HEP: pt states he went to the eye doctor on Wed. due to eye infection    Pertinent History  Left THA 05/12/16, right THA 2008, CHF, MI, DM2, HTN, OA, prostate CA, shingles, A-fib, hearing loss, knee sg, recent thorascopy.  05/02/2017- bleeding and began with urinary incontinence    Patient Stated Goals  work on balance, "be able to walk, get off the walker (I'll live with a cane)".  Patient has used the walker since January when he fell.    Currently in Pain?  Other (Comment)   pt reports his eyes "sting" due to the infection                      [facility] Adult PT  Treatment/Exercise - 02/25/18 0001      Transfers   Sit to Stand  5: Supervision    Stand to Sit  5: Supervision    Number of Reps  Other reps (comment)   5   Comments  feet on floor - no UE support used      Ambulation/Gait   Ambulation/Gait  Yes    Ambulation/Gait Assistance  4: Min guard;4: Min assist   min assist x 3 occurrences with LOB   Ambulation/Gait Assistance Details  LOB occurred 3 times requiring min assist for recovery; busy environment appeared to contribute to LOB due to decr. focus on task of walking  Ambulation Distance (Feet)  600 Feet   200' x 1 with SPC; then [facility] w/quad base used    Assistive device  Straight cane   cane with rubber quad tip alos used   Ambulation Surface  Level;Indoor      Neuro Re-ed    Neuro Re-ed Details   Pt performed standing balance exercsies at counter - forward, back and side kicks x 10 reps each with UE support for assist. with balance:  marching in place x 10 reps, then progressed to marching forward and backward along counter x 2 reps;  Pt attempted to stand heel to toe but had difficulty so this was modified to partial heel to toe with UE support on counter:  SLS 10 sec hold with bil. UE support on each leg - added this exercise to HEP but emphasized need for UE support due to severity of balance deficits with this exercise       Pt performed standing tap ups to 6" step with 1 UE support on rail - 10 reps each; progressed to tap ups to 2nd step  With light bil. UE support with CGA - 10 reps each   Pt unable to attempt SLS on either leg without any UE support  Pt stated at end of session that he may purchase a lighter weight rollator than the one he currently has at home - so that He would have a walker that would roll easier in small gravel  Continued to advise pt that RW needed for safety - not use of SPC at home at this time due to LOB occurrences  PT Education - 02/25/18 1027    Education provided  Yes    Education Details   added SLS with UE support to HEP    Person(s) Educated  Patient    Methods  Explanation;Handout    Comprehension  Verbalized understanding;Returned demonstration       PT Short Term Goals - 02/16/18 1010      PT SHORT TERM GOAL #1   Title  The patient will return demo HEP for gaze adaptation, hip flexor strengthening, balance and general mobility.  (STG goals due 02/17/18)    Baseline  Has HEP for LE strength, balance and mobility.  Held gaze until after eye doctor appt    Time  4    Period  Weeks    Status  Partially Met      PT SHORT TERM GOAL #2   Title  The patient will improve Berg from 28/56 to > or equal to 34/56 to demonstrate improving steady state balance without UE support.    Baseline  Improved to 43/56    Time  4    Period  Weeks    Status  Achieved      PT SHORT TERM GOAL #3   Title  The patient will improve TUG with RW from 17.47 seconds to < or equal to 14 seconds to demo improving functional mobility and dec'ing risk for falls.    Baseline  IMproved to 14.31 seconds    Time  4    Period  Weeks    Status  Partially Met      PT SHORT TERM GOAL #4   Title  The patient will perform sit<>stand without UE support to demo improved functional strength in LEs.    Time  4    Period  Weeks    Status  Achieved      PT SHORT TERM GOAL #5  Title  The patient will improve gait speed from 2.31 ft/sec to > or equal to 2.62 ft/sec to transition to "full community ambulator" classification of gait.    Baseline  2.43 ft/sec measured on 02/14/2018    Time  4    Period  Weeks    Status  Partially Met        PT Long Term Goals - 02/17/18 1206      PT LONG TERM GOAL #1   Title  The patient will return demo progression of HEP and ongoing wellness plan.  (LTGs due date: 03/19/18)    Time  8    Period  Weeks      PT LONG TERM GOAL #2   Title  The patient will improve Berg score from 28/56 to > or equal to 46/56 to demo dec'ing risk for falls.    Baseline  43/56 at STG  assessment.    Time  8    Period  Weeks    Status  Revised      PT LONG TERM GOAL #3   Title  The patient will perform 5 time sit<>stand without UE support in < or equal to 14 seconds.    Time  8    Period  Weeks      PT LONG TERM GOAL #4   Title  The patient will ambulate short community distances with least restrictive device mod indep (x 500 ft).    Time  8    Period  Weeks      PT LONG TERM GOAL #5   Title  The patient will negotiate household surfaces with SPC (if appropriate as he progresses) mod indep x 300 ft.    Time  8    Period  Weeks            Plan - 02/25/18 1030    Clinical Impression Statement  Pt presents with bil. eye redness due to eye infection, for which he states he received treatment for on Wed., 02-23-18; pt reports having diplopia at today's session - visual deficits may have possibly contributed to balance deficits.  Pt reported inability to attempt x1 viewing exercise due to decreased acuity at today's session.  Pt's left foot noted to have decreased coordination at times during gait (very slight ataxic movement noted occasionally at initial stance) - pt had 3 moderate LOB occurrences with use of SPC during gait training. Needs to continue to use RW for safety with gait.      PT Frequency  2x / week    PT Duration  8 weeks    PT Treatment/Interventions  ADLs/Self Care Home Management;DME Instruction;Gait training;Stair training;Functional mobility training;Therapeutic exercise;Therapeutic activities;Balance training;Neuromuscular re-education;Patient/family education;Vestibular;Canalith Repostioning    PT Next Visit Plan  continue gait with SPC in clinic: continue balance exercises - check SLS ex added to HEP; pt stated at end of session that he may purchase a lighter weight rollator than the one he currently has (gait train with rollator??)     Consulted and Agree with Plan of Care  Patient       Patient will benefit from skilled therapeutic intervention  in order to improve the following deficits and impairments:  Abnormal gait, Decreased activity tolerance, Decreased balance, Decreased endurance, Decreased coordination, Decreased knowledge of use of DME, Decreased mobility, Decreased strength, Postural dysfunction  Visit Diagnosis: Other abnormalities of gait and mobility  Unsteadiness on feet     Problem List Patient Active Problem List   Diagnosis  Date Noted  . Encounter for therapeutic drug monitoring 12/20/2017  . S/P thoracotomy 11/29/2017  . Sepsis ([place])   . Hypoxemia   . Pneumonia of left lower lobe due to infectious organism ([place])   . Mediastinal lymphadenopathy   . Chronic cough 11/23/2017  . CLL (chronic lymphocytic leukemia) ([place]) 11/23/2017  . Pleural effusion 11/23/2017  . HCAP (healthcare-associated pneumonia) 11/20/2017  . Anemia 11/20/2017  . Ischemic cardiomyopathy 09/01/2017  . Right hip pain   . Acute otitis media   . Chronic systolic CHF (congestive heart failure) ([place])   . Pressure injury of skin 07/20/2017  . Malignant otitis externa 07/19/2017  . Fall at home, initial encounter 07/19/2017  . S/P left THA, AA 05/12/2016  . LPRD (laryngopharyngeal reflux disease) 05/21/2015  . Other allergic rhinitis 05/21/2015  . Obesity 12/12/2014  . Upper airway cough syndrome 12/11/2014  . Cough variant asthma 07/27/2014  . Wheezing 06/20/2014  . Symptomatic bradycardia - s/p Biotronik (serial number [id number]) 02/08/2014  . Diabetes mellitus type 2, noninsulin dependent ([place])   . Edema of foot 03/06/2013  . Traumatic ecchymosis of right foot 03/06/2013  . Bone spur 12/30/2012  . Pain of toe of left foot 12/30/2012  . Hyperlipidemia 07/13/2012  . HTN (hypertension) 04/12/2012  . Sick sinus syndrome ([place]) 08/17/2011  . PAF (paroxysmal atrial fibrillation) ([place]) 09/16/2010    Catia Todorov, [place], PT 02/25/2018, 10:40 AM  [facility] [address] [place], [name], [address] Phone: [phone]   Fax:  [phone]  Name: MALICK NETZ MRN: [id number] Date of Birth: 02/03/1933

## 2018-02-28 ENCOUNTER — Ambulatory Visit: Payer: Medicare Other | Admitting: Rehabilitative and Restorative Service Providers"

## 2018-02-28 ENCOUNTER — Encounter: Payer: Self-pay | Admitting: Rehabilitative and Restorative Service Providers"

## 2018-02-28 DIAGNOSIS — M6281 Muscle weakness (generalized): Secondary | ICD-10-CM | POA: Diagnosis not present

## 2018-02-28 DIAGNOSIS — N5231 Erectile dysfunction following radical prostatectomy: Secondary | ICD-10-CM | POA: Diagnosis not present

## 2018-02-28 DIAGNOSIS — C61 Malignant neoplasm of prostate: Secondary | ICD-10-CM | POA: Diagnosis not present

## 2018-02-28 DIAGNOSIS — R310 Gross hematuria: Secondary | ICD-10-CM | POA: Diagnosis not present

## 2018-02-28 DIAGNOSIS — R2681 Unsteadiness on feet: Secondary | ICD-10-CM

## 2018-02-28 DIAGNOSIS — R2689 Other abnormalities of gait and mobility: Secondary | ICD-10-CM

## 2018-02-28 DIAGNOSIS — R296 Repeated falls: Secondary | ICD-10-CM | POA: Diagnosis not present

## 2018-02-28 NOTE — Therapy (Signed)
[facility] [address] [place], [name], [address] Phone: [phone]   Fax:  [phone]  Physical Therapy Treatment  Patient Details  Name: Oscar Baker MRN: [id number] Date of Birth: 12-13-1932 Referring Provider: unsteadiness on feet, muscle weakness   Encounter Date: 02/28/2018  PT End of Session - 02/28/18 0938    Visit Number  12    Number of Visits  17    Date for PT Re-Evaluation  03/19/18    Authorization Type  Medicare & BCBS supplement    PT Start Time  0844    PT Stop Time  0928    PT Time Calculation (min)  44 min    Equipment Utilized During Treatment  Gait belt    Activity Tolerance  Patient tolerated treatment well    Behavior During Therapy  [facility] for tasks assessed/performed       Past Medical History:  Diagnosis Date  . Asthma 1950's   history of  . Atrial fibrillation ([place])   . Bilateral carpal tunnel syndrome   . Bilateral lower extremity edema   . Bladder tumor   . Chronic systolic heart failure (HCC)    Echo 1/19: Mild LVH, EF 45-50, inf HK, MAC, severe LAE, severe RAE // Echo 7/15: Mild LVH, mod focal basal sept hypertrophy, EF 55-60, AV peak and mean 16/9, trivial MR, mod LAE, PASP 38  . CLL (chronic lymphocytic leukemia) [facility]) oncologist-  dr Ilene Qua--   dx [phone] ;  Lymphocytosis, CLL - per lov note 05-11-2017 currently under active survillance,  CT 04-17-2014 show very small lymphadenopathy, no indication for treatment  . Coronary artery disease    cardiologist-  dr Cathie Olden--  08-18-2017 Intermittant risk nuclear study w/ large area of inferior infartion with no evidence ishcemia   . 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 Diabetes mellitus type 2, noninsulin dependent ([place])   . Elevated PSA    since prostatectomy but now resolved  . Hematuria 04/2017  . History of ear infection    Right  . History of MI (myocardial infarction)    per myoview nuclear study 08-18-2017 , unknown when  .  History of shingles 08/2017   L ear and scalp, possible  . Hyperlipidemia   . Hypertension   . Ischemic cardiomyopathy 09/01/2017   Presumed +CAD with Nuclear stress test 08/18/17 - Inferior scar, no ischemia, intermediate risk // med management unless +angina or worse dyspnea  . OA (osteoarthritis)   . Pacemaker 02/08/2014   followed by dr g. taylor--  single chamber Biotronik due to SSS  . Permanent atrial fibrillation ([place])   . Pneumonia 2019   Left lung  . Prostate cancer [facility]) urologist-  dr Diona Fanti   dx 2004--  Gleason 8, PSA 10.45--  11-28-2002  s/p  radical prostatectomy;  recurrent w/ increasing PSA, started ADT treatment  . RBBB (right bundle branch block)   . Sick sinus syndrome ([place])    a-Flutter with episodes of bradycardia; S/P Biotronik (serial number [id number]) 02-08-2014  . Urinary incontinence   . Wears hearing aid in right ear    receiver and transmitter    Past Surgical History:  Procedure Laterality Date  . APPENDECTOMY    . BACK SURGERY     disk  . CARDIAC CATHETERIZATION  09-03-1999  dr Cathie Olden   abnormal cardiolite study:  minor luminal irregularities but no critial coronary artery stenosis  . CARDIOVASCULAR STRESS TEST  08-18-2017  dr Cathie Olden   Intermediate  risk nuclear study w/ large area inferior infarction, no evidence of ishcemia (consistant w/ prior MI)/  study not gated due to frequent PVCs  . CARPAL TUNNEL RELEASE Right 2000  . CARPAL TUNNEL RELEASE Left 11/19/2009  . CATARACT EXTRACTION Right 07/2015  . CATARACT EXTRACTION Left 09/2015  . CYSTOSCOPY N/A 11/04/2017   Procedure: CYSTOSCOPY AND CAUTERIZATION OF BLADDER;  Surgeon: Franchot Gallo, MD;  Location: [facility];  Service: Urology;  Laterality: N/A;  . INSERTION PENILE PROSTHESIS  02-22-2004    dr Mattie Marlin  [facility]  . IR THORACENTESIS ASP PLEURAL SPACE W/IMG GUIDE  11/26/2017  . KNEE ARTHROSCOPY Left 07/2010  . PERMANENT PACEMAKER INSERTION N/A 02/08/2014   Procedure: PERMANENT  PACEMAKER INSERTION;  Surgeon: Evans Lance, MD;  Location: [facility] C[facility] LAB;  Service: Cardiovascular;  Laterality: N/A;  . PLEURAL EFFUSION DRAINAGE Left 11/30/2017   Procedure: DRAINAGE OF HEMOTHORAX;  Surgeon: Ivin Poot, MD;  Location: [facility];  Service: Thoracic;  Laterality: Left;  . RADICAL RETROPUBIC PROSTATECTOMY W/ BILATERAL PELVIC LYMPH NODE DISSECTION  11-28-2002   dr Mattie Marlin  [facility]  . TONSILLECTOMY    . TOTAL HIP ARTHROPLASTY Left 05/12/2016   Procedure: LEFT TOTAL HIP ARTHROPLASTY ANTERIOR APPROACH;  Surgeon: Paralee Cancel, MD;  Location: [facility];  Service: Orthopedics;  Laterality: Left;  . TOTAL HIP ARTHROPLASTY Right 07-15-2006   dr Alvan Dame  [facility]  . TRANSTHORACIC ECHOCARDIOGRAM  07/20/2017   mild LVH, ef 45-50%, hypokinesis of the basal-midinferior myocardium, due to AFib unable to evaluate diastolic function/  severe LAE and RAE/  trivial PR and TR  . VIDEO ASSISTED THORACOSCOPY Left 11/30/2017   Procedure: VIDEO ASSISTED THORACOSCOPY;  Surgeon: Ivin Poot, MD;  Location: [facility];  Service: Thoracic;  Laterality: Left;  [name] VIDEO ASSISTED THORACOSCOPY (VATS)/EMPYEMA Left 11/29/2017   Procedure: VIDEO ASSISTED THORACOSCOPY (VATS)/EMPYEMA;  Surgeon: Ivin Poot, MD;  Location: [facility];  Service: Thoracic;  Laterality: Left;    There were no vitals filed for this visit.  Subjective Assessment - 02/28/18 0844    Subjective  "I'd like to see if I can walk with the cane".  Patient notes he has considered a rollator RW, but has not purchased one yet.  He does not want to work on a rollator yet, as he wants to keep working on [employer], materials with [employer].    He has a 10:00 appointment with eye doctor.      Pertinent History  Left THA 05/12/16, right THA 2008, CHF, MI, DM2, HTN, OA, prostate CA, shingles, A-fib, hearing loss, knee sg, recent thorascopy.  05/02/2017- bleeding and began with urinary incontinence    Patient Stated Goals  work on balance, "be able to walk, get off the walker (I'll live  with a cane)".  Patient has used the walker since January when he fell.    Currently in Pain?  No/denies             Vestibular Assessment - 02/28/18 0847      Vestibular Assessment   General Observation  "I see two when I try the exercise."  He notes that gaze x 1 provokes double vision.        Occulomotor Exam   Comment  Right gaze= patient sees 2 in the R plane; L plane= single object.  *He has spoken with eye doctor about this symtpom.                 [facility] Adult PT Treatment/Exercise -  02/28/18 0920      Ambulation/Gait   Ambulation/Gait  Yes    Ambulation/Gait Assistance  4: Min guard;4: Min assist    Ambulation/Gait Assistance Details  No LOB today, PT provided tactile and CGA to min A during gait emphasizing upright posture (dec'ing trunk flexion), slowed pace, and controlled steps.    Ambulation Distance (Feet)  350 Feet    Assistive device  Straight cane    Ambulation Surface  Level;Indoor    Gait Comments  Dynamic gait activities performing standing turns x 180 degrees x 5 reps to R and L sides with CGA and SPC for balance.  Figure 8 walking with CGA to min A (due to forward leaning), x 8 reps.  Discussed visual fixation during walking to improve balance.  Gait activities with horizontal head turns and scanning.  Gait with dual tasks of counting while walking through tight spaces.   Giat near a wall with SPc with PT reducing tactile guarding for patient to work more independently on balance x 15 feet x 4 reps.      Neuro Re-ed    Neuro Re-ed Details   Weight shifting activities in standing working on lateral stepping<>midline R and L sides x 20 reps dec'ing UE support (at counter and by PT for patient to work on balance strategies).  Also did stepping posterior<>midline and anterior<>midline.  Alternating foot taps to targets for R/L weight shifting with min A due to anterior chest leaning + loss of balance.        Exercises   Exercises  Other Exercises    Other  Exercises   Lateral step ups attempted at 6" step, however patient notes knee pain so ended exercise.  Standing hip hike/hip depression with tactiel and demo cues- patient unable to perform this movement.  Standing heel raises x 8 reps without UE support.  Sit<>stand dec'ing UE support x 6 reps.  Standing "w" exercise for postural strengthening.               PT Short Term Goals - 02/16/18 1010      PT SHORT TERM GOAL #1   Title  The patient will return demo HEP for gaze adaptation, hip flexor strengthening, balance and general mobility.  (STG goals due 02/17/18)    Baseline  Has HEP for LE strength, balance and mobility.  Held gaze until after eye doctor appt    Time  4    Period  Weeks    Status  Partially Met      PT SHORT TERM GOAL #2   Title  The patient will improve Berg from 28/56 to > or equal to 34/56 to demonstrate improving steady state balance without UE support.    Baseline  Improved to 43/56    Time  4    Period  Weeks    Status  Achieved      PT SHORT TERM GOAL #3   Title  The patient will improve TUG with RW from 17.47 seconds to < or equal to 14 seconds to demo improving functional mobility and dec'ing risk for falls.    Baseline  IMproved to 14.31 seconds    Time  4    Period  Weeks    Status  Partially Met      PT SHORT TERM GOAL #4   Title  The patient will perform sit<>stand without UE support to demo improved functional strength in LEs.    Time  4    Period  Weeks    Status  Achieved      PT SHORT TERM GOAL #5   Title  The patient will improve gait speed from 2.31 ft/sec to > or equal to 2.62 ft/sec to transition to "full community ambulator" classification of gait.    Baseline  2.43 ft/sec measured on 02/14/2018    Time  4    Period  Weeks    Status  Partially Met        PT Long Term Goals - 02/17/18 1206      PT LONG TERM GOAL #1   Title  The patient will return demo progression of HEP and ongoing wellness plan.  (LTGs due date: 03/19/18)     Time  8    Period  Weeks      PT LONG TERM GOAL #2   Title  The patient will improve Berg score from 28/56 to > or equal to 46/56 to demo dec'ing risk for falls.    Baseline  43/56 at STG assessment.    Time  8    Period  Weeks    Status  Revised      PT LONG TERM GOAL #3   Title  The patient will perform 5 time sit<>stand without UE support in < or equal to 14 seconds.    Time  8    Period  Weeks      PT LONG TERM GOAL #4   Title  The patient will ambulate short community distances with least restrictive device mod indep (x 500 ft).    Time  8    Period  Weeks      PT LONG TERM GOAL #5   Title  The patient will negotiate household surfaces with SPC (if appropriate as he progresses) mod indep x 300 ft.    Time  8    Period  Weeks            Plan - 02/28/18 7001    Clinical Impression Statement  The patient continues to improve with use of SPC, however is still not safe to perform as household mobility due to fall risk.  The patient notes double vision in R visual field.  PT emphasizing balance recovery strategies, functional gait/balance for household mobility.     PT Treatment/Interventions  ADLs/Self Care Home Management;DME Instruction;Gait training;Stair training;Functional mobility training;Therapeutic exercise;Therapeutic activities;Balance training;Neuromuscular re-education;Patient/family education;Vestibular;Canalith Repostioning    PT Next Visit Plan  continue gait with SPC in clinic: continue balance exercises - check SLS ex added to HEP; ? try rollator RW for community use (if patient interested).  Dynamic gait activites/ dynamic balance emphasizing balance strategies.    Consulted and Agree with Plan of Care  Patient       Patient will benefit from skilled therapeutic intervention in order to improve the following deficits and impairments:  Abnormal gait, Decreased activity tolerance, Decreased balance, Decreased endurance, Decreased coordination, Decreased  knowledge of use of DME, Decreased mobility, Decreased strength, Postural dysfunction  Visit Diagnosis: Other abnormalities of gait and mobility  Unsteadiness on feet  Muscle weakness (generalized)  Repeated falls     Problem List Patient Active Problem List   Diagnosis Date Noted  . Encounter for therapeutic drug monitoring 12/20/2017  . S/P thoracotomy 11/29/2017  . Sepsis ([place])   . Hypoxemia   . Pneumonia of left lower lobe due to infectious organism ([place])   . Mediastinal lymphadenopathy   . Chronic cough 11/23/2017  . CLL (chronic lymphocytic leukemia) ([place])  11/23/2017  . Pleural effusion 11/23/2017  . HCAP (healthcare-associated pneumonia) 11/20/2017  . Anemia 11/20/2017  . Ischemic cardiomyopathy 09/01/2017  . Right hip pain   . Acute otitis media   . Chronic systolic CHF (congestive heart failure) ([place])   . Pressure injury of skin 07/20/2017  . Malignant otitis externa 07/19/2017  . Fall at home, initial encounter 07/19/2017  . S/P left THA, AA 05/12/2016  . LPRD (laryngopharyngeal reflux disease) 05/21/2015  . Other allergic rhinitis 05/21/2015  . Obesity 12/12/2014  . Upper airway cough syndrome 12/11/2014  . Cough variant asthma 07/27/2014  . Wheezing 06/20/2014  . Symptomatic bradycardia - s/p Biotronik (serial number [id number]) 02/08/2014  . Diabetes mellitus type 2, noninsulin dependent ([place])   . Edema of foot 03/06/2013  . Traumatic ecchymosis of right foot 03/06/2013  . Bone spur 12/30/2012  . Pain of toe of left foot 12/30/2012  . Hyperlipidemia 07/13/2012  . HTN (hypertension) 04/12/2012  . Sick sinus syndrome ([place]) 08/17/2011  . PAF (paroxysmal atrial fibrillation) ([place]) 09/16/2010    Davie Sagona , PT 02/28/2018, 9:41 AM  [facility] [address] [place], [name], [address] Phone: [phone]   Fax:  [phone]  Name: Oscar Baker MRN: [id number] Date of Birth:  [birth year]

## 2018-03-01 DIAGNOSIS — L219 Seborrheic dermatitis, unspecified: Secondary | ICD-10-CM | POA: Diagnosis not present

## 2018-03-01 DIAGNOSIS — L570 Actinic keratosis: Secondary | ICD-10-CM | POA: Diagnosis not present

## 2018-03-01 DIAGNOSIS — D485 Neoplasm of uncertain behavior of skin: Secondary | ICD-10-CM | POA: Diagnosis not present

## 2018-03-01 DIAGNOSIS — C44329 Squamous cell carcinoma of skin of other parts of face: Secondary | ICD-10-CM | POA: Diagnosis not present

## 2018-03-02 ENCOUNTER — Ambulatory Visit: Payer: Medicare Other | Admitting: Physical Therapy

## 2018-03-02 ENCOUNTER — Encounter: Payer: Self-pay | Admitting: Rehabilitative and Restorative Service Providers"

## 2018-03-02 DIAGNOSIS — R2681 Unsteadiness on feet: Secondary | ICD-10-CM | POA: Diagnosis not present

## 2018-03-02 DIAGNOSIS — M6281 Muscle weakness (generalized): Secondary | ICD-10-CM | POA: Diagnosis not present

## 2018-03-02 DIAGNOSIS — R2689 Other abnormalities of gait and mobility: Secondary | ICD-10-CM

## 2018-03-02 DIAGNOSIS — R296 Repeated falls: Secondary | ICD-10-CM | POA: Diagnosis not present

## 2018-03-02 NOTE — Patient Instructions (Signed)
Access Code: [email or web address]   Updated HEP  Exercises  Heel Raises with Counter Support - 20 reps - 1 sets - 1x daily - 7x weekly  Toe Raises with Counter Support - 20 reps - 1 sets - 1x daily - 7x weekly  Standing Hip Abduction with Theraband Resistance - 10 reps - 3 sets - 1x daily - 7x weekly  Seated Ankle Eversion with Anchored Resistance - 10 reps - 3 sets - 1x daily - 7x weekly  Narrow Stance with Eyes Closed and Head Nods - 3 reps - 1 sets - 30 seconds hold - 2x daily - 7x weekly  Single Leg Stance with Support - 2 reps - 1 sets - 10 seconds hold - 2x daily - 7x weekly

## 2018-03-02 NOTE — Therapy (Signed)
[facility] [address] [place], [name], [address] Phone: [phone]   Fax:  [phone]  Physical Therapy Treatment  Patient Details  Name: Oscar Baker MRN: [id number] Date of Birth: 12/04/1932 Referring Provider: unsteadiness on feet, muscle weakness   Encounter Date: 03/02/2018  PT End of Session - 03/02/18 0838    Visit Number  13    Number of Visits  17    Date for PT Re-Evaluation  03/19/18    Authorization Type  Medicare & BCBS supplement    PT Start Time  0845    PT Stop Time  0931    PT Time Calculation (min)  46 min    Equipment Utilized During Treatment  Gait belt    Activity Tolerance  Patient tolerated treatment well    Behavior During Therapy  [facility] for tasks assessed/performed       Past Medical History:  Diagnosis Date  . Asthma 1950's   history of  . Atrial fibrillation ([place])   . Bilateral carpal tunnel syndrome   . Bilateral lower extremity edema   . Bladder tumor   . Chronic systolic heart failure (HCC)    Echo 1/19: Mild LVH, EF 45-50, inf HK, MAC, severe LAE, severe RAE // Echo 7/15: Mild LVH, mod focal basal sept hypertrophy, EF 55-60, AV peak and mean 16/9, trivial MR, mod LAE, PASP 38  . CLL (chronic lymphocytic leukemia) [facility]) oncologist-  dr Ilene Qua--   dx [phone] ;  Lymphocytosis, CLL - per lov note 05-11-2017 currently under active survillance,  CT 04-17-2014 show very small lymphadenopathy, no indication for treatment  . Coronary artery disease    cardiologist-  dr Cathie Olden--  08-18-2017 Intermittant risk nuclear study w/ large area of inferior infartion with no evidence ishcemia   . 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 Diabetes mellitus type 2, noninsulin dependent ([place])   . Elevated PSA    since prostatectomy but now resolved  . Hematuria 04/2017  . History of ear infection    Right  . History of MI (myocardial infarction)    per myoview nuclear study 08-18-2017 , unknown when  .  History of shingles 08/2017   L ear and scalp, possible  . Hyperlipidemia   . Hypertension   . Ischemic cardiomyopathy 09/01/2017   Presumed +CAD with Nuclear stress test 08/18/17 - Inferior scar, no ischemia, intermediate risk // med management unless +angina or worse dyspnea  . OA (osteoarthritis)   . Pacemaker 02/08/2014   followed by dr g. taylor--  single chamber Biotronik due to SSS  . Permanent atrial fibrillation ([place])   . Pneumonia 2019   Left lung  . Prostate cancer [facility]) urologist-  dr Diona Fanti   dx 2004--  Gleason 8, PSA 10.45--  11-28-2002  s/p  radical prostatectomy;  recurrent w/ increasing PSA, started ADT treatment  . RBBB (right bundle branch block)   . Sick sinus syndrome ([place])    a-Flutter with episodes of bradycardia; S/P Biotronik (serial number [id number]) 02-08-2014  . Urinary incontinence   . Wears hearing aid in right ear    receiver and transmitter    Past Surgical History:  Procedure Laterality Date  . APPENDECTOMY    . BACK SURGERY     disk  . CARDIAC CATHETERIZATION  09-03-1999  dr Cathie Olden   abnormal cardiolite study:  minor luminal irregularities but no critial coronary artery stenosis  . CARDIOVASCULAR STRESS TEST  08-18-2017  dr Cathie Olden   Intermediate  risk nuclear study w/ large area inferior infarction, no evidence of ishcemia (consistant w/ prior MI)/  study not gated due to frequent PVCs  . CARPAL TUNNEL RELEASE Right 2000  . CARPAL TUNNEL RELEASE Left 11/19/2009  . CATARACT EXTRACTION Right 07/2015  . CATARACT EXTRACTION Left 09/2015  . CYSTOSCOPY N/A 11/04/2017   Procedure: CYSTOSCOPY AND CAUTERIZATION OF BLADDER;  Surgeon: Franchot Gallo, MD;  Location[facility];  Service: Urology;  Laterality: N/A;  . INSERTION PENILE PROSTHESIS  02-22-2004    dr Mattie Marlin  [facility]  . IR THORACENTESIS ASP PLEURAL SPACE W/IMG GUIDE  11/26/2017  . KNEE ARTHROSCOPY Left 07/2010  . PERMANENT PACEMAKER INSERTION N/A 02/08/2014   Procedure: PERMANENT  PACEMAKER INSERTION;  Surgeon: Evans Lance, MD;  Location: [facility] CATH LAB;  Service: Cardiovascular;  Laterality: N/A;  . PLEURAL EFFUSION DRAINAGE Left 11/30/2017   Procedure: DRAINAGE OF HEMOTHORAX;  Surgeon: Ivin Poot, MD;  Location: [facility];  Service: Thoracic;  Laterality: Left;  . RADICAL RETROPUBIC PROSTATECTOMY W/ BILATERAL PELVIC LYMPH NODE DISSECTION  11-28-2002   dr Mattie Marlin  [facility]  . TONSILLECTOMY    . TOTAL HIP ARTHROPLASTY Left 05/12/2016   Procedure: LEFT TOTAL HIP ARTHROPLASTY ANTERIOR APPROACH;  Surgeon: Paralee Cancel, MD;  Location: [facility];  Service: Orthopedics;  Laterality: Left;  . TOTAL HIP ARTHROPLASTY Right 07-15-2006   dr Alvan Dame  [facility]  . TRANSTHORACIC ECHOCARDIOGRAM  07/20/2017   mild LVH, ef 45-50%, hypokinesis of the basal-midinferior myocardium, due to AFib unable to evaluate diastolic function/  severe LAE and RAE/  trivial PR and TR  . VIDEO ASSISTED THORACOSCOPY Left 11/30/2017   Procedure: VIDEO ASSISTED THORACOSCOPY;  Surgeon: Ivin Poot, MD;  Location: [facility];  Service: Thoracic;  Laterality: Left;  [name] VIDEO ASSISTED THORACOSCOPY (VATS)/EMPYEMA Left 11/29/2017   Procedure: VIDEO ASSISTED THORACOSCOPY (VATS)/EMPYEMA;  Surgeon: Ivin Poot, MD;  Location: [facility];  Service: Thoracic;  Laterality: Left;    There were no vitals filed for this visit.  Subjective Assessment - 03/02/18 0847    Subjective  Asking for a new handout of his HEP as he is not sure which exercises have been removed as others were added. Still having double-vision and MD says it may or may not get any better. Balance is about the same as a few days ago, but much better than when he first started.     Pertinent History  Left THA 05/12/16, right THA 2008, CHF, MI, DM2, HTN, OA, prostate CA, shingles, A-fib, hearing loss, knee sg, recent thorascopy.  05/02/2017- bleeding and began with urinary incontinence    Patient Stated Goals  work on balance, "be able to walk, get off the walker (I'll  live with a cane)".  Patient has used the walker since January when he fell.    Currently in Pain?  No/denies                       [facility] Adult PT Treatment/Exercise - 03/02/18 0001      Transfers   Sit to Stand  6: Modified independent (Device/Increase time);4: Min guard    Sit to Stand Details (indicate cue type and reason)  with RW vs SPC    Stand to Sit  6: Modified independent (Device/Increase time);4: Min guard      Ambulation/Gait   Ambulation/Gait Assistance  4: Min guard;5: Supervision    Ambulation/Gait Assistance Details  pt with independent recovery of LOB when maneuvering  around obstacles with SPC; no LOB when walking down straight hall and focused on slight incr velocity    Ambulation Distance (Feet)  350 Feet   240   Assistive device  Straight cane   rubber quad tip   Gait Pattern  Step-through pattern;Decreased stride length;Lateral hip instability;Trunk flexed;Wide base of support    Ambulation Surface  Level;Indoor    Gait Comments  negotiating around obstacles on both his left and his right noting pt tends to slow down his speed and does not resume faster speed once beyond obstacles; vc for slight incr velocity when appropriate with pt more stable at this faster pace          Balance Exercises - 03/02/18 1822      Balance Exercises: Standing   Rockerboard  Anterior/posterior;EO;10 reps;Intermittent UE support   anterior and posterior wtshifting using hip strategy   Gait with Head Turns  Forward;Upper extremity support   with SPC in hallway   Tandem Gait  Forward;Upper extremity support   at counter   Retro Gait  Upper extremity support;3 reps    Sidestepping  4 reps        PT Education - 03/02/18 1750    Education Details  educated on where to purchase a rubber quad tip for his SPC at home; educated to begin walking with his cane in his hallway at home (pt reports no carpet in his home)    Person(s) Educated  Patient    Methods   Explanation;Demonstration    Comprehension  Verbalized understanding;Returned demonstration       PT Short Term Goals - 02/16/18 1010      PT SHORT TERM GOAL #1   Title  The patient will return demo HEP for gaze adaptation, hip flexor strengthening, balance and general mobility.  (STG goals due 02/17/18)    Baseline  Has HEP for LE strength, balance and mobility.  Held gaze until after eye doctor appt    Time  4    Period  Weeks    Status  Partially Met      PT SHORT TERM GOAL #2   Title  The patient will improve Berg from 28/56 to > or equal to 34/56 to demonstrate improving steady state balance without UE support.    Baseline  Improved to 43/56    Time  4    Period  Weeks    Status  Achieved      PT SHORT TERM GOAL #3   Title  The patient will improve TUG with RW from 17.47 seconds to < or equal to 14 seconds to demo improving functional mobility and dec'ing risk for falls.    Baseline  IMproved to 14.31 seconds    Time  4    Period  Weeks    Status  Partially Met      PT SHORT TERM GOAL #4   Title  The patient will perform sit<>stand without UE support to demo improved functional strength in LEs.    Time  4    Period  Weeks    Status  Achieved      PT SHORT TERM GOAL #5   Title  The patient will improve gait speed from 2.31 ft/sec to > or equal to 2.62 ft/sec to transition to "full community ambulator" classification of gait.    Baseline  2.43 ft/sec measured on 02/14/2018    Time  4    Period  Weeks    Status  Partially  Met        PT Long Term Goals - 02/17/18 1206      PT LONG TERM GOAL #1   Title  The patient will return demo progression of HEP and ongoing wellness plan.  (LTGs due date: 03/19/18)    Time  8    Period  Weeks      PT LONG TERM GOAL #2   Title  The patient will improve Berg score from 28/56 to > or equal to 46/56 to demo dec'ing risk for falls.    Baseline  43/56 at STG assessment.    Time  8    Period  Weeks    Status  Revised      PT LONG  TERM GOAL #3   Title  The patient will perform 5 time sit<>stand without UE support in < or equal to 14 seconds.    Time  8    Period  Weeks      PT LONG TERM GOAL #4   Title  The patient will ambulate short community distances with least restrictive device mod indep (x 500 ft).    Time  8    Period  Weeks      PT LONG TERM GOAL #5   Title  The patient will negotiate household surfaces with SPC (if appropriate as he progresses) mod indep x 300 ft.    Time  8    Period  Weeks            Plan - 03/02/18 1827    Clinical Impression Statement  Patient continues to improve his balance. When walking in clinic hallway with focus on straight path and safe use of cane (no additional tasks/challenges) patient did not lose his balance x 40 ft x 2. Pt reports he does have a hallway at home where he can practice with his cane. Discussed that he should only practice with cane in the hallway as he needs more than 2 days/week to practice with cane if he is going to be able to improve enough to use cane. He agreed to only use cane in the hallway. Also educated on where to purchase a rubber quad tip for his SPC. Patient continues to benefit from PT and working towards [place].    PT Frequency  2x / week    PT Duration  8 weeks    PT Treatment/Interventions  ADLs/Self Care Home Management;DME Instruction;Gait training;Stair training;Functional mobility training;Therapeutic exercise;Therapeutic activities;Balance training;Neuromuscular re-education;Patient/family education;Vestibular;Canalith Repostioning    PT Next Visit Plan  How has he done practiciing with SPC in his hallway? continue gait with SPC in clinic: continue balance exercises  Dynamic gait activites/ dynamic balance emphasizing balance strategies.    PT Home Exercise Plan  [phone]    Consulted and Agree with Plan of Care  Patient       Patient will benefit from skilled therapeutic intervention in order to improve the following deficits and  impairments:  Abnormal gait, Decreased activity tolerance, Decreased balance, Decreased endurance, Decreased coordination, Decreased knowledge of use of DME, Decreased mobility, Decreased strength, Postural dysfunction  Visit Diagnosis: Other abnormalities of gait and mobility  Unsteadiness on feet  Muscle weakness (generalized)  Repeated falls     Problem List Patient Active Problem List   Diagnosis Date Noted  . Encounter for therapeutic drug monitoring 12/20/2017  . S/P thoracotomy 11/29/2017  . Sepsis ([place])   . Hypoxemia   . Pneumonia of left lower lobe due to infectious organism ([place])   .  Mediastinal lymphadenopathy   . Chronic cough 11/23/2017  . CLL (chronic lymphocytic leukemia) ([place]) 11/23/2017  . Pleural effusion 11/23/2017  . HCAP (healthcare-associated pneumonia) 11/20/2017  . Anemia 11/20/2017  . Ischemic cardiomyopathy 09/01/2017  . Right hip pain   . Acute otitis media   . Chronic systolic CHF (congestive heart failure) ([place])   . Pressure injury of skin 07/20/2017  . Malignant otitis externa 07/19/2017  . Fall at home, initial encounter 07/19/2017  . S/P left THA, AA 05/12/2016  . LPRD (laryngopharyngeal reflux disease) 05/21/2015  . Other allergic rhinitis 05/21/2015  . Obesity 12/12/2014  . Upper airway cough syndrome 12/11/2014  . Cough variant asthma 07/27/2014  . Wheezing 06/20/2014  . Symptomatic bradycardia - s/p Biotronik (serial number [id number]) 02/08/2014  . Diabetes mellitus type 2, noninsulin dependent ([place])   . Edema of foot 03/06/2013  . Traumatic ecchymosis of right foot 03/06/2013  . Bone spur 12/30/2012  . Pain of toe of left foot 12/30/2012  . Hyperlipidemia 07/13/2012  . HTN (hypertension) 04/12/2012  . Sick sinus syndrome ([place]) 08/17/2011  . PAF (paroxysmal atrial fibrillation) ([place]) 09/16/2010    Rexanne Mano, PT 03/02/2018, 6:36 PM  [facility] [address] [place], [name], [address] Phone: [phone]   Fax:  [phone]  Name: DURWARD MATRANGA MRN: [id number] Date of Birth: 11-22-1932

## 2018-03-03 NOTE — Progress Notes (Signed)
Cardiology Office Note:    Date:  03/04/2018   ID:  Oscar Baker, Oscar Baker 03-28-1933, MRN [id number]  PCP:  [name], MD  Cardiologist:  Mertie Moores, MD  Electrophysiologist:  Cristopher Peru, MD  Pulmonologist:  Dr. Ander Slade Oncologist:  Dr. Alen Blew  Referring MD: [name], MD   Chief Complaint  Patient presents with  . Follow-up    CHF    History of Present Illness:    Oscar Baker is a 84 y.o. male with atrialfibrillation/flutter, sick sinus syndrome s/p pacemaker in 2015,systolic heart failure secondary to presumed ischemic cardiomyopathy, diabetes, hypertension, hyperlipidemia, chronic lymphocytic leukemia, prostate cancer, bladder tumor.CHADS2-VASc=4 (HTN, DM, 84 yo).He is on long-term anticoagulation with Coumadin. Echocardiogram in January 2019 demonstrated EF 45-50 with inferior hypokinesis.  Nuclear stress test in February 2019 demonstrated inferior scar but no ischemia.  He has been managed medically.  He was admitted in June 2019 with left lower lobe pneumonia with associated effusion/empyema.  He underwent thoracentesis x2 and ultimately underwent left VATS.  He was last seen in follow-up January 19, 2018.  He was still having some issues with volume excess.  I adjusted his diuretic.     Oscar Baker returns for follow up.  He is here with his wife.  He is doing well.  He continues to walk with a walker and work with PT for his balance issues.  He has not had any chest pain, syncope, shortness of breath.  His weights at home have been stable.  He has not noticed significant leg swelling.   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 Asthma 1950's   history of  . Atrial fibrillation ([place])   . Bilateral carpal tunnel syndrome   . Bilateral lower extremity edema   . Bladder tumor   . Chronic systolic heart failure (HCC)    Echo 1/19: Mild LVH, EF 45-50, inf HK, MAC, severe LAE, severe RAE // Echo 7/15: Mild LVH, mod focal basal sept hypertrophy, EF 55-60, AV peak and mean 16/9, trivial MR, mod LAE, PASP 38  . CLL (chronic lymphocytic leukemia) [facility]) oncologist-  dr Ilene Qua--   dx [phone] ;  Lymphocytosis, CLL - per lov note 05-11-2017 currently under active survillance,  CT 04-17-2014 show very small lymphadenopathy, no indication for treatment  . Coronary artery disease    cardiologist-  dr Cathie Olden--  08-18-2017 Intermittant risk nuclear study w/ large area of inferior infartion with no evidence ishcemia   . 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 Diabetes mellitus type 2, noninsulin dependent ([place])   . Elevated PSA    since prostatectomy but now resolved  . Hematuria 04/2017  . History of ear infection    Right  . History of MI (myocardial infarction)    per myoview nuclear study 08-18-2017 , unknown when  . History of shingles 08/2017   L ear and scalp, possible  .  Hyperlipidemia   . Hypertension   . Ischemic cardiomyopathy 09/01/2017   Presumed +CAD with Nuclear stress test 08/18/17 - Inferior scar, no ischemia, intermediate risk // med management unless +angina or worse dyspnea  . OA (osteoarthritis)   . Pacemaker 02/08/2014   followed by dr g. taylor--  single chamber Biotronik due to SSS  . Permanent atrial fibrillation ([place])   . Pneumonia 2019   Left lung  . Prostate cancer [facility]) urologist-  dr Diona Fanti   dx 2004--  Gleason 8, PSA 10.45--  11-28-2002   s/p  radical prostatectomy;  recurrent w/ increasing PSA, started ADT treatment  . RBBB (right bundle branch block)   . Sick sinus syndrome ([place])    a-Flutter with episodes of bradycardia; S/P Biotronik (serial number [id number]) 02-08-2014  . Urinary incontinence   . Wears hearing aid in right ear    receiver and transmitter   Surgical Hx: The patient  has a past surgical history that includes Back surgery; Appendectomy; permanent pacemaker insertion (N/A, 02/08/2014); Total hip arthroplasty (Left, 05/12/2016); Cardiovascular stress test (08-18-2017  dr Cathie Olden); transthoracic echocardiogram (07/20/2017); Cardiac catheterization (09-03-1999  dr Cathie Olden); Tonsillectomy; RADICAL RETROPUBIC PROSTATECTOMY W/ BILATERAL PELVIC LYMPH NODE DISSECTION (11-28-2002   dr Mattie Marlin  [facility]); INSERTION PENILE PROSTHESIS (02-22-2004    dr Mattie Marlin  [facility]); Total hip arthroplasty (Right, 07-15-2006   dr Alvan Dame  [facility]); Carpal tunnel release (Right, 2000); Carpal tunnel release (Left, 11/19/2009); Cataract extraction (Right, 07/2015); Cataract extraction (Left, 09/2015); Knee arthroscopy (Left, 07/2010); Cystoscopy (N/A, 11/04/2017); IR THORACENTESIS ASP PLEURAL SPACE W/IMG GUIDE (11/26/2017); Video assisted thoracoscopy (vats)/empyema (Left, 11/29/2017); Pleural effusion drainage (Left, 11/30/2017); and Video assisted thoracoscopy (Left, 11/30/2017).   Current Medications: Current Meds  Medication Sig  . acetaminophen (TYLENOL) 500 MG tablet Take 2 tablets (1,000 mg total) by mouth every 6 (six) hours.  [name] atorvastatin (LIPITOR) 20 MG tablet TAKE 1 TABLET BY MOUTH DAILY AT 6PM  . benzonatate (TESSALON) 200 MG capsule Take 1 capsule (200 mg total) by mouth 3 (three) times daily as needed for cough.  . fenofibrate micronized (LOFIBRA) 200 MG capsule TAKE 1 CAPSULE BY MOUTH DAILY BEFORE BREAKFAST  . furosemide (LASIX) 20 MG tablet Take 1 tablet (20 mg total) by mouth daily.  [name] guaiFENesin (MUCINEX) 600 MG 12 hr tablet Take by mouth 2  (two) times daily.  . metFORMIN (GLUCOPHAGE) 1000 MG tablet Take 1,000 mg by mouth 2 (two) times daily with a meal.    . multivitamin (THERAGRAN) per tablet Take 1 tablet by mouth daily. Will stop prior to procedure  . potassium chloride (K-DUR) 10 MEQ tablet Take 1 tablet (10 mEq total) by mouth daily.  . sitaGLIPtin (JANUVIA) 100 MG tablet Take 100 mg by mouth daily.  . vitamin B-12 (CYANOCOBALAMIN) 1000 MCG tablet Take 1,000 mcg by mouth daily. Will stop prior to procedure  . warfarin (COUMADIN) 5 MG tablet TAKE AS DIRECTED BY COUMADIN CLINIC     Allergies:   Altace [ramipril]; Codeine; and Simvastatin   Social History   Tobacco Use  . Smoking status: Former Smoker    Years: 25.00    Types: Pipe, Cigars    Last attempt to quit: 11/25/1975    Years since quitting: 42.3  . Smokeless tobacco: Never Used  Substance Use Topics  . Alcohol use: Yes    Alcohol/week: 0.0 standard drinks    Comment: occasional  . Drug use: No     Family Hx: The patient's family history includes Allergic rhinitis  in his brother and mother; Emphysema in his mother; Heart attack in his father; Pulmonary fibrosis in his brother.  ROS:   Please see the history of present illness.    ROS All other systems reviewed and are negative.   EKGs/Labs/Other Test Reviewed:    EKG:  EKG is  ordered today.  The ekg ordered today demonstrates V paced, HR 72  Recent Labs: 07/22/2017: TSH 0.436 11/20/2017: B Natriuretic Peptide 717.8 11/22/2017: Magnesium 1.9 12/04/2017: ALT 24; Hemoglobin 10.4; Platelets 450 01/26/2018: BUN 21; Creatinine, Ser 0.91; Potassium 4.7; Sodium 140   Recent Lipid Panel Lab Results  Component Value Date/Time   CHOL 145 05/28/2015 10:59 AM   TRIG 107 05/28/2015 10:59 AM   HDL 23 (L) 05/28/2015 10:59 AM   CHOLHDL 6.3 (H) 05/28/2015 10:59 AM   LDLCALC 101 05/28/2015 10:59 AM   LDLDIRECT 133.8 07/13/2012 09:19 AM    Physical Exam:    VS:  BP 118/66   Pulse 72   Ht 5' 10.5" (1.791 m)    Wt 211 lb (95.7 kg)   SpO2 94%   BMI 29.85 kg/m     Wt Readings from Last 3 Encounters:  03/04/18 211 lb (95.7 kg)  02/23/18 205 lb (93 kg)  01/19/18 209 lb (94.8 kg)     Physical Exam  Constitutional: He is oriented to person, place, and time. He appears well-developed and well-nourished. No distress.  HENT:  Head: Normocephalic and atraumatic.  Eyes: No scleral icterus.  Neck: No JVD present.  Cardiovascular: Normal rate and regular rhythm.  No murmur heard. Pulmonary/Chest: Effort normal. He has no rales.  Abdominal: Soft. He exhibits no distension.  Musculoskeletal: He exhibits no edema.  Neurological: He is alert and oriented to person, place, and time.  Skin: Skin is warm and dry.    ASSESSMENT & PLAN:    Chronic systolic CHF (congestive heart failure) (HCC) Presumed ischemic cardiomyopathy.  EF 45-50.  NYHA 2.  Volume status appears stable.  He denies chest pain.  Blood pressure will not likely tolerate the addition of hydralazine.  Continue beta-blocker.  Follow-up with Dr. Acie Fredrickson in 3 months.  PAF (paroxysmal atrial fibrillation) (HCC)  Asymptomatic.  Continue anticoagulation with Warfarin.  Essential hypertension   The patient's blood pressure is controlled on his current regimen.  Continue current therapy.    Dispo:  Return in about 3 months (around 06/03/2018) for Routine Follow Up, w/ Dr. Acie Fredrickson.   Medication Adjustments/Labs and Tests Ordered: Current medicines are reviewed at length with the patient today.  Concerns regarding medicines are outlined above.  Tests Ordered: Orders Placed This Encounter  Procedures  . EKG 12-Lead   Medication Changes: No orders of the defined types were placed in this encounter.   Signed, Richardson Dopp, PA-C  03/04/2018 8:50 AM    [facility] [address], [place], [place]  [address] Phone: [phone]; Fax: [phone]

## 2018-03-04 ENCOUNTER — Ambulatory Visit (INDEPENDENT_AMBULATORY_CARE_PROVIDER_SITE_OTHER): Payer: Medicare Other | Admitting: *Deleted

## 2018-03-04 ENCOUNTER — Encounter: Payer: Self-pay | Admitting: Physician Assistant

## 2018-03-04 ENCOUNTER — Ambulatory Visit (INDEPENDENT_AMBULATORY_CARE_PROVIDER_SITE_OTHER): Payer: Medicare Other | Admitting: Physician Assistant

## 2018-03-04 VITALS — BP 118/66 | HR 72 | Ht 70.5 in | Wt 211.0 lb

## 2018-03-04 DIAGNOSIS — I10 Essential (primary) hypertension: Secondary | ICD-10-CM | POA: Diagnosis not present

## 2018-03-04 DIAGNOSIS — I482 Chronic atrial fibrillation: Secondary | ICD-10-CM | POA: Diagnosis not present

## 2018-03-04 DIAGNOSIS — I480 Paroxysmal atrial fibrillation: Secondary | ICD-10-CM

## 2018-03-04 DIAGNOSIS — I5022 Chronic systolic (congestive) heart failure: Secondary | ICD-10-CM | POA: Diagnosis not present

## 2018-03-04 DIAGNOSIS — Z5181 Encounter for therapeutic drug level monitoring: Secondary | ICD-10-CM

## 2018-03-04 DIAGNOSIS — I4821 Permanent atrial fibrillation: Secondary | ICD-10-CM

## 2018-03-04 DIAGNOSIS — I255 Ischemic cardiomyopathy: Secondary | ICD-10-CM | POA: Diagnosis not present

## 2018-03-04 LAB — POCT INR: INR: 2.6 (ref 2.0–3.0)

## 2018-03-04 NOTE — Patient Instructions (Signed)
Description   Continue on same dosage 1 tablet every day except 1.5 tablets on Saturdays. Recheck INR in 6 weeks.  Call Coumadin Clinic [phone] with any concerns.

## 2018-03-04 NOTE — Patient Instructions (Signed)
Medication Instructions: Your physician recommends that you continue on your current medications as directed. Please refer to the Current Medication list given to you today.   Labwork: None   Procedures/Testing: None   Follow-Up: Your physician recommends that you schedule a follow-up appointment in: 3 months with Dr. Acie Fredrickson    Any Additional Special Instructions Will Be Listed Below (If Applicable).     If you need a refill on your cardiac medications before your next appointment, please call your pharmacy.  '

## 2018-03-07 ENCOUNTER — Ambulatory Visit: Payer: Medicare Other | Admitting: Rehabilitative and Restorative Service Providers"

## 2018-03-07 ENCOUNTER — Encounter: Payer: Self-pay | Admitting: Rehabilitative and Restorative Service Providers"

## 2018-03-07 ENCOUNTER — Other Ambulatory Visit: Payer: Self-pay | Admitting: Cardiovascular Disease

## 2018-03-07 DIAGNOSIS — M6281 Muscle weakness (generalized): Secondary | ICD-10-CM

## 2018-03-07 DIAGNOSIS — R2689 Other abnormalities of gait and mobility: Secondary | ICD-10-CM | POA: Diagnosis not present

## 2018-03-07 DIAGNOSIS — R296 Repeated falls: Secondary | ICD-10-CM | POA: Diagnosis not present

## 2018-03-07 DIAGNOSIS — R2681 Unsteadiness on feet: Secondary | ICD-10-CM

## 2018-03-07 DIAGNOSIS — C9110 Chronic lymphocytic leukemia of B-cell type not having achieved remission: Secondary | ICD-10-CM

## 2018-03-07 NOTE — Therapy (Signed)
[facility] [address] [place], [name], [address] Phone: [phone]   Fax:  [phone]  Physical Therapy Treatment  Patient Details  Name: Oscar Baker MRN: [id number] Date of Birth: Jun 09, 1933 Referring Provider: unsteadiness on feet, muscle weakness   Encounter Date: 03/07/2018  PT End of Session - 03/07/18 1409    Visit Number  14    Number of Visits  17    Date for PT Re-Evaluation  03/19/18    Authorization Type  Medicare & BCBS supplement    PT Start Time  1407    PT Stop Time  1447    PT Time Calculation (min)  40 min    Equipment Utilized During Treatment  Gait belt    Activity Tolerance  Patient tolerated treatment well    Behavior During Therapy  [facility] for tasks assessed/performed       Past Medical History:  Diagnosis Date  . Asthma 1950's   history of  . Atrial fibrillation ([place])   . Bilateral carpal tunnel syndrome   . Bilateral lower extremity edema   . Bladder tumor   . Chronic systolic heart failure (HCC)    Echo 1/19: Mild LVH, EF 45-50, inf HK, MAC, severe LAE, severe RAE // Echo 7/15: Mild LVH, mod focal basal sept hypertrophy, EF 55-60, AV peak and mean 16/9, trivial MR, mod LAE, PASP 38  . CLL (chronic lymphocytic leukemia) [facility]) oncologist-  dr Ilene Qua--   dx [phone] ;  Lymphocytosis, CLL - per lov note 05-11-2017 currently under active survillance,  CT 04-17-2014 show very small lymphadenopathy, no indication for treatment  . Coronary artery disease    cardiologist-  dr Cathie Olden--  08-18-2017 Intermittant risk nuclear study w/ large area of inferior infartion with no evidence ishcemia   . 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 Diabetes mellitus type 2, noninsulin dependent ([place])   . Elevated PSA    since prostatectomy but now resolved  . Hematuria 04/2017  . History of ear infection    Right  . History of MI (myocardial infarction)    per myoview nuclear study 08-18-2017 , unknown when  .  History of shingles 08/2017   L ear and scalp, possible  . Hyperlipidemia   . Hypertension   . Ischemic cardiomyopathy 09/01/2017   Presumed +CAD with Nuclear stress test 08/18/17 - Inferior scar, no ischemia, intermediate risk // med management unless +angina or worse dyspnea  . OA (osteoarthritis)   . Pacemaker 02/08/2014   followed by dr g. taylor--  single chamber Biotronik due to SSS  . Permanent atrial fibrillation ([place])   . Pneumonia 2019   Left lung  . Prostate cancer [facility]) urologist-  dr Diona Fanti   dx 2004--  Gleason 8, PSA 10.45--  11-28-2002  s/p  radical prostatectomy;  recurrent w/ increasing PSA, started ADT treatment  . RBBB (right bundle branch block)   . Sick sinus syndrome ([place])    a-Flutter with episodes of bradycardia; S/P Biotronik (serial number [id number]) 02-08-2014  . Urinary incontinence   . Wears hearing aid in right ear    receiver and transmitter    Past Surgical History:  Procedure Laterality Date  . APPENDECTOMY    . BACK SURGERY     disk  . CARDIAC CATHETERIZATION  09-03-1999  dr Cathie Olden   abnormal cardiolite study:  minor luminal irregularities but no critial coronary artery stenosis  . CARDIOVASCULAR STRESS TEST  08-18-2017  dr Cathie Olden   Intermediate  risk nuclear study w/ large area inferior infarction, no evidence of ishcemia (consistant w/ prior MI)/  study not gated due to frequent PVCs  . CARPAL TUNNEL RELEASE Right 2000  . CARPAL TUNNEL RELEASE Left 11/19/2009  . CATARACT EXTRACTION Right 07/2015  . CATARACT EXTRACTION Left 09/2015  . CYSTOSCOPY N/A 11/04/2017   Procedure: CYSTOSCOPY AND CAUTERIZATION OF BLADDER;  Surgeon: Franchot Gallo, MD;  Location: [facility];  Service: Urology;  Laterality: N/A;  . INSERTION PENILE PROSTHESIS  02-22-2004    dr Mattie Marlin  [facility]  . IR THORACENTESIS ASP PLEURAL SPACE W/IMG GUIDE  11/26/2017  . KNEE ARTHROSCOPY Left 07/2010  . PERMANENT PACEMAKER INSERTION N/A 02/08/2014   Procedure: PERMANENT  PACEMAKER INSERTION;  Surgeon: Evans Lance, MD;  Location: [facility] CATH LAB;  Service: Cardiovascular;  Laterality: N/A;  . PLEURAL EFFUSION DRAINAGE Left 11/30/2017   Procedure: DRAINAGE OF HEMOTHORAX;  Surgeon: Ivin Poot, MD;  Location: [facility];  Service: Thoracic;  Laterality: Left;  . RADICAL RETROPUBIC PROSTATECTOMY W/ BILATERAL PELVIC LYMPH NODE DISSECTION  11-28-2002   dr Mattie Marlin  [facility]  . TONSILLECTOMY    . TOTAL HIP ARTHROPLASTY Left 05/12/2016   Procedure: LEFT TOTAL HIP ARTHROPLASTY ANTERIOR APPROACH;  Surgeon: Paralee Cancel, MD;  Location: [facility];  Service: Orthopedics;  Laterality: Left;  . TOTAL HIP ARTHROPLASTY Right 07-15-2006   dr Alvan Dame  [facility]  . TRANSTHORACIC ECHOCARDIOGRAM  07/20/2017   mild LVH, ef 45-50%, hypokinesis of the basal-midinferior myocardium, due to AFib unable to evaluate diastolic function/  severe LAE and RAE/  trivial PR and TR  . VIDEO ASSISTED THORACOSCOPY Left 11/30/2017   Procedure: VIDEO ASSISTED THORACOSCOPY;  Surgeon: Ivin Poot, MD;  Location: [facility];  Service: Thoracic;  Laterality: Left;  [name] VIDEO ASSISTED THORACOSCOPY (VATS)/EMPYEMA Left 11/29/2017   Procedure: VIDEO ASSISTED THORACOSCOPY (VATS)/EMPYEMA;  Surgeon: Ivin Poot, MD;  Location: [facility];  Service: Thoracic;  Laterality: Left;    There were no vitals filed for this visit.  Subjective Assessment - 03/07/18 1406    Subjective  The patient notes that he has been practicing his walking in the hallway at home with a cane.    Pertinent History  Left THA 05/12/16, right THA 2008, CHF, MI, DM2, HTN, OA, prostate CA, shingles, A-fib, hearing loss, knee sg, recent thorascopy.  05/02/2017- bleeding and began with urinary incontinence    Patient Stated Goals  work on balance, "be able to walk, get off the walker (I'll live with a cane)".  Patient has used the walker since January when he fell.    Currently in Pain?  No/denies         [facility] PT Assessment - 03/07/18 1425      Transfers    Transfers  Sit to Stand;Stand to Sit    Five time sit to stand comments   15.97 sec with one UE support    Stand to Sit  6: Modified independent (Device/Increase time)      Ambulation/Gait   Ambulation/Gait  Yes    Ambulation/Gait Assistance  4: Min guard;5: Supervision                   [facility] Adult PT Treatment/Exercise - 03/07/18 1425      Transfers   Sit to Stand  6: Modified independent (Device/Increase time);4: Min guard   Patient had one loss of balance posteriorly     Ambulation/Gait   Ambulation/Gait Assistance Details  PT CGA (in  gym) and supervision (in hallway) with [facility] for gait    Ambulation Distance (Feet)  600 Feet    Assistive device  Straight cane   quad tip   Gait Pattern  Step-through pattern;Decreased stride length;Lateral hip instability;Trunk flexed;Wide base of support    Ambulation Surface  Level;Indoor    Stairs  Yes    Stairs Assistance  5: Supervision    Stair Management Technique  One rail Right;Step to pattern;Sideways    Number of Stairs  4    Gait Comments  Figure 8 walking around 2 obstacles with CGA using SPC, negotiating tighter spaces to mimic household mobility with CGA.        Neuro Re-ed    Neuro Re-ed Details   The patient performed stepping midline<>posterior, midline<>anteriorly, midline<>lateral with CGA emphasizing larger amplitude steps for stepping strategy.  Forwards/backwards walking in parallel bars dec'ing UE support with CGA.  Heel walking and toe walking with UE support working on balance.  Standing "up/up/ down/down to 6" steps x 5 reps each side for balance and strengthening.  Multi-tasking/ dual tasks of carrying water while walking and counting by 3s forward while walking.       Exercises   Exercises  Other Exercises    Other Exercises   Patient inquired about sci fit use or recumbent bike at home gym.  PT encouraged 5-10 minutes on home equipment.  Patient tolerated seated stepper x 5 minutes without difficulty.       Pilates   Pilates Reformer  `               PT Short Term Goals - 02/16/18 1010      PT SHORT TERM GOAL #1   Title  The patient will return demo HEP for gaze adaptation, hip flexor strengthening, balance and general mobility.  (STG goals due 02/17/18)    Baseline  Has HEP for LE strength, balance and mobility.  Held gaze until after eye doctor appt    Time  4    Period  Weeks    Status  Partially Met      PT SHORT TERM GOAL #2   Title  The patient will improve Berg from 28/56 to > or equal to 34/56 to demonstrate improving steady state balance without UE support.    Baseline  Improved to 43/56    Time  4    Period  Weeks    Status  Achieved      PT SHORT TERM GOAL #3   Title  The patient will improve TUG with RW from 17.47 seconds to < or equal to 14 seconds to demo improving functional mobility and dec'ing risk for falls.    Baseline  IMproved to 14.31 seconds    Time  4    Period  Weeks    Status  Partially Met      PT SHORT TERM GOAL #4   Title  The patient will perform sit<>stand without UE support to demo improved functional strength in LEs.    Time  4    Period  Weeks    Status  Achieved      PT SHORT TERM GOAL #5   Title  The patient will improve gait speed from 2.31 ft/sec to > or equal to 2.62 ft/sec to transition to "full community ambulator" classification of gait.    Baseline  2.43 ft/sec measured on 02/14/2018    Time  4    Period  Weeks  Status  Partially Met        PT Long Term Goals - 02/17/18 1206      PT LONG TERM GOAL #1   Title  The patient will return demo progression of HEP and ongoing wellness plan.  (LTGs due date: 03/19/18)    Time  8    Period  Weeks      PT LONG TERM GOAL #2   Title  The patient will improve Berg score from 28/56 to > or equal to 46/56 to demo dec'ing risk for falls.    Baseline  43/56 at STG assessment.    Time  8    Period  Weeks    Status  Revised      PT LONG TERM GOAL #3   Title  The patient will  perform 5 time sit<>stand without UE support in < or equal to 14 seconds.    Time  8    Period  Weeks      PT LONG TERM GOAL #4   Title  The patient will ambulate short community distances with least restrictive device mod indep (x 500 ft).    Time  8    Period  Weeks      PT LONG TERM GOAL #5   Title  The patient will negotiate household surfaces with SPC (if appropriate as he progresses) mod indep x 300 ft.    Time  8    Period  Weeks            Plan - 03/07/18 1515    Clinical Impression Statement  PT continuing to progress gait with SPC.  Patient is compliant with home program working on cane use in hallway.  PT continuing to work to [employer].      PT Treatment/Interventions  ADLs/Self Care Home Management;DME Instruction;Gait training;Stair training;Functional mobility training;Therapeutic exercise;Therapeutic activities;Balance training;Neuromuscular re-education;Patient/family education;Vestibular;Canalith Repostioning    PT Next Visit Plan  Gait with SPC, progress home gym routine, balance working on stepping and hip strategies.      Consulted and Agree with Plan of Care  Patient       Patient will benefit from skilled therapeutic intervention in order to improve the following deficits and impairments:  Abnormal gait, Decreased activity tolerance, Decreased balance, Decreased endurance, Decreased coordination, Decreased knowledge of use of DME, Decreased mobility, Decreased strength, Postural dysfunction  Visit Diagnosis: Other abnormalities of gait and mobility  Unsteadiness on feet  Muscle weakness (generalized)  Repeated falls     Problem List Patient Active Problem List   Diagnosis Date Noted  . Encounter for therapeutic drug monitoring 12/20/2017  . S/P thoracotomy 11/29/2017  . Sepsis ([place])   . Hypoxemia   . Pneumonia of left lower lobe due to infectious organism ([place])   . Mediastinal lymphadenopathy   . Chronic cough 11/23/2017  . CLL (chronic  lymphocytic leukemia) ([place]) 11/23/2017  . Pleural effusion 11/23/2017  . HCAP (healthcare-associated pneumonia) 11/20/2017  . Anemia 11/20/2017  . Ischemic cardiomyopathy 09/01/2017  . Right hip pain   . Acute otitis media   . Chronic systolic CHF (congestive heart failure) ([place])   . Pressure injury of skin 07/20/2017  . Malignant otitis externa 07/19/2017  . Fall at home, initial encounter 07/19/2017  . S/P left THA, AA 05/12/2016  . LPRD (laryngopharyngeal reflux disease) 05/21/2015  . Other allergic rhinitis 05/21/2015  . Obesity 12/12/2014  . Upper airway cough syndrome 12/11/2014  . Cough variant asthma 07/27/2014  . Wheezing 06/20/2014  .  Symptomatic bradycardia - s/p Biotronik (serial number [id number]) 02/08/2014  . Diabetes mellitus type 2, noninsulin dependent ([place])   . Edema of foot 03/06/2013  . Traumatic ecchymosis of right foot 03/06/2013  . Bone spur 12/30/2012  . Pain of toe of left foot 12/30/2012  . Hyperlipidemia 07/13/2012  . HTN (hypertension) 04/12/2012  . Sick sinus syndrome ([place]) 08/17/2011  . PAF (paroxysmal atrial fibrillation) ([place]) 09/16/2010    [place], PT 03/07/2018, 3:19 PM  [facility] [address] [place], [name], [address] Phone: [phone]   Fax:  [phone]  Name: Oscar Baker MRN: [id number] Date of Birth: 11-18-1932

## 2018-03-09 ENCOUNTER — Encounter: Payer: Self-pay | Admitting: Rehabilitative and Restorative Service Providers"

## 2018-03-09 ENCOUNTER — Ambulatory Visit: Payer: Medicare Other | Admitting: Rehabilitative and Restorative Service Providers"

## 2018-03-09 DIAGNOSIS — R2689 Other abnormalities of gait and mobility: Secondary | ICD-10-CM

## 2018-03-09 DIAGNOSIS — R296 Repeated falls: Secondary | ICD-10-CM

## 2018-03-09 DIAGNOSIS — M6281 Muscle weakness (generalized): Secondary | ICD-10-CM | POA: Diagnosis not present

## 2018-03-09 DIAGNOSIS — R2681 Unsteadiness on feet: Secondary | ICD-10-CM | POA: Diagnosis not present

## 2018-03-09 NOTE — Therapy (Signed)
[facility] [address] [place], [name], [address] Phone: [phone]   Fax:  [phone]  Physical Therapy Treatment  Patient Details  Name: Oscar Baker MRN: [id number] Date of Birth: March 31, 1933 Referring Provider: unsteadiness on feet, muscle weakness   Encounter Date: 03/09/2018  PT End of Session - 03/09/18 1025    Visit Number  15    Number of Visits  17    Date for PT Re-Evaluation  03/19/18    Authorization Type  Medicare & BCBS supplement    PT Start Time  1022    PT Stop Time  1102    PT Time Calculation (min)  40 min    Equipment Utilized During Treatment  Gait belt    Activity Tolerance  Patient tolerated treatment well    Behavior During Therapy  [facility] for tasks assessed/performed       Past Medical History:  Diagnosis Date  . Asthma 1950's   history of  . Atrial fibrillation ([place])   . Bilateral carpal tunnel syndrome   . Bilateral lower extremity edema   . Bladder tumor   . Chronic systolic heart failure (HCC)    Echo 1/19: Mild LVH, EF 45-50, inf HK, MAC, severe LAE, severe RAE // Echo 7/15: Mild LVH, mod focal basal sept hypertrophy, EF 55-60, AV peak and mean 16/9, trivial MR, mod LAE, PASP 38  . CLL (chronic lymphocytic leukemia) [facility]) oncologist-  dr Ilene Qua--   dx [phone] ;  Lymphocytosis, CLL - per lov note 05-11-2017 currently under active survillance,  CT 04-17-2014 show very small lymphadenopathy, no indication for treatment  . Coronary artery disease    cardiologist-  dr Cathie Olden--  08-18-2017 Intermittant risk nuclear study w/ large area of inferior infartion with no evidence ishcemia   . 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 Diabetes mellitus type 2, noninsulin dependent ([place])   . Elevated PSA    since prostatectomy but now resolved  . Hematuria 04/2017  . History of ear infection    Right  . History of MI (myocardial infarction)    per myoview nuclear study 08-18-2017 , unknown when  .  History of shingles 08/2017   L ear and scalp, possible  . Hyperlipidemia   . Hypertension   . Ischemic cardiomyopathy 09/01/2017   Presumed +CAD with Nuclear stress test 08/18/17 - Inferior scar, no ischemia, intermediate risk // med management unless +angina or worse dyspnea  . OA (osteoarthritis)   . Pacemaker 02/08/2014   followed by dr g. taylor--  single chamber Biotronik due to SSS  . Permanent atrial fibrillation ([place])   . Pneumonia 2019   Left lung  . Prostate cancer [facility]) urologist-  dr Diona Fanti   dx 2004--  Gleason 8, PSA 10.45--  11-28-2002  s/p  radical prostatectomy;  recurrent w/ increasing PSA, started ADT treatment  . RBBB (right bundle branch block)   . Sick sinus syndrome ([place])    a-Flutter with episodes of bradycardia; S/P Biotronik (serial number [id number]) 02-08-2014  . Urinary incontinence   . Wears hearing aid in right ear    receiver and transmitter    Past Surgical History:  Procedure Laterality Date  . APPENDECTOMY    . BACK SURGERY     disk  . CARDIAC CATHETERIZATION  09-03-1999  dr Cathie Olden   abnormal cardiolite study:  minor luminal irregularities but no critial coronary artery stenosis  . CARDIOVASCULAR STRESS TEST  08-18-2017  dr Cathie Olden   Intermediate  risk nuclear study w/ large area inferior infarction, no evidence of ishcemia (consistant w/ prior MI)/  study not gated due to frequent PVCs  . CARPAL TUNNEL RELEASE Right 2000  . CARPAL TUNNEL RELEASE Left 11/19/2009  . CATARACT EXTRACTION Right 07/2015  . CATARACT EXTRACTION Left 09/2015  . CYSTOSCOPY N/A 11/04/2017   Procedure: CYSTOSCOPY AND CAUTERIZATION OF BLADDER;  Surgeon: Franchot Gallo, MD;  Location: [facility];  Service: Urology;  Laterality: N/A;  . INSERTION PENILE PROSTHESIS  02-22-2004    dr Mattie Marlin  [facility]  . IR THORACENTESIS ASP PLEURAL SPACE W/IMG GUIDE  11/26/2017  . KNEE ARTHROSCOPY Left 07/2010  . PERMANENT PACEMAKER INSERTION N/A 02/08/2014   Procedure: PERMANENT  PACEMAKER INSERTION;  Surgeon: Evans Lance, MD;  Location: [facility] CATH LAB;  Service: Cardiovascular;  Laterality: N/A;  . PLEURAL EFFUSION DRAINAGE Left 11/30/2017   Procedure: DRAINAGE OF HEMOTHORAX;  Surgeon: Ivin Poot, MD;  Location: [facility];  Service: Thoracic;  Laterality: Left;  . RADICAL RETROPUBIC PROSTATECTOMY W/ BILATERAL PELVIC LYMPH NODE DISSECTION  11-28-2002   dr Mattie Marlin  [facility]  . TONSILLECTOMY    . TOTAL HIP ARTHROPLASTY Left 05/12/2016   Procedure: LEFT TOTAL HIP ARTHROPLASTY ANTERIOR APPROACH;  Surgeon: Paralee Cancel, MD;  Location: [facility];  Service: Orthopedics;  Laterality: Left;  . TOTAL HIP ARTHROPLASTY Right 07-15-2006   dr Alvan Dame  [facility]  . TRANSTHORACIC ECHOCARDIOGRAM  07/20/2017   mild LVH, ef 45-50%, hypokinesis of the basal-midinferior myocardium, due to AFib unable to evaluate diastolic function/  severe LAE and RAE/  trivial PR and TR  . VIDEO ASSISTED THORACOSCOPY Left 11/30/2017   Procedure: VIDEO ASSISTED THORACOSCOPY;  Surgeon: Ivin Poot, MD;  Location: [place] [facility];  Service: Thoracic;  Laterality: Left;  [name] VIDEO ASSISTED THORACOSCOPY (VATS)/EMPYEMA Left 11/29/2017   Procedure: VIDEO ASSISTED THORACOSCOPY (VATS)/EMPYEMA;  Surgeon: Ivin Poot, MD;  Location: [place];  Service: Thoracic;  Laterality: Left;    There were no vitals filed for this visit.  Subjective Assessment - 03/09/18 1640    Subjective  The patient notes that he performed the bike at the fitness center at his home yesterday x 5 minutes x 2 sets.  He notes feeling fatigue in distal LEs.      Pertinent History  Left THA 05/12/16, right THA 2008, CHF, MI, DM2, HTN, OA, prostate CA, shingles, A-fib, hearing loss, knee sg, recent thorascopy.  05/02/2017- bleeding and began with urinary incontinence    Patient Stated Goals  work on balance, "be able to walk, get off the walker (I'll live with a cane)".  Patient has used the walker since January when he fell.    Currently in Pain?  No/denies                        [facility] Adult PT Treatment/Exercise - 03/09/18 1025      Transfers   Transfers  Sit to Stand;Stand to Sit    Sit to Stand  5: Supervision    Sit to Stand Details (indicate cue type and reason)  patient had a posterior loss of balance and braced against mat fo rsupport    Comments  PT and patient discussed (self care) home safety and risk for falls due to multiple losses of balance today with SPC + quad tip.  We modified his treatment plan to have him practice daily at home with [facility] in the hallway and continue working on HEP.  He notes he feels like his wrist gets sore with SPC.  We discussed him using SPC more like a forearm crutch due to needing greater support than what SPC offers.  I showed him forearm crutch and he notes he would rather use the RW than a crutch.  We also discussed his rollator RW and he wanted to try the clinic rollator.  He felt this was too heavy and he would rather continue with RW.      Ambulation/Gait   Ambulation/Gait  Yes    Ambulation/Gait Assistance  4: Min guard;4: Min assist;6: Modified independent (Device/Increase time)    Ambulation/Gait Assistance Details  The patient is mod indep with RW entering into and exiting clinic.    He requires CGA to min A with SPC today with 3 losses of balance needing help for steadying assistance.    Ambulation Distance (Feet)  500 Feet    Assistive device  Rolling walker;Straight cane    Gait Pattern  Step-through pattern;Decreased stride length;Lateral hip instability;Trunk flexed;Wide base of support    Ambulation Surface  Level;Indoor    Gait velocity  1.52 ft/sec with [facility]      Self-Care   Self-Care  Other Self-Care Comments    Other Self-Care Comments   PT and patient discussed continuing current program and f/u in 3-4 weeks to assess walking with SPC.              PT Education - 03/09/18 1648    Education Details  home program, home walking with [facility] in hallway, rolling walker for  functional mobility, discussed fall risk, discussed home gym use    Person(s) Educated  Patient    Methods  Demonstration;Explanation    Comprehension  Verbalized understanding;Returned demonstration       PT Short Term Goals - 02/16/18 1010      PT SHORT TERM GOAL #1   Title  The patient will return demo HEP for gaze adaptation, hip flexor strengthening, balance and general mobility.  (STG goals due 02/17/18)    Baseline  Has HEP for LE strength, balance and mobility.  Held gaze until after eye doctor appt    Time  4    Period  Weeks    Status  Partially Met      PT SHORT TERM GOAL #2   Title  The patient will improve Berg from 28/56 to > or equal to 34/56 to demonstrate improving steady state balance without UE support.    Baseline  Improved to 43/56    Time  4    Period  Weeks    Status  Achieved      PT SHORT TERM GOAL #3   Title  The patient will improve TUG with RW from 17.47 seconds to < or equal to 14 seconds to demo improving functional mobility and dec'ing risk for falls.    Baseline  IMproved to 14.31 seconds    Time  4    Period  Weeks    Status  Partially Met      PT SHORT TERM GOAL #4   Title  The patient will perform sit<>stand without UE support to demo improved functional strength in LEs.    Time  4    Period  Weeks    Status  Achieved      PT SHORT TERM GOAL #5   Title  The patient will improve gait speed from 2.31 ft/sec to > or equal to 2.62 ft/sec to transition to "full  community ambulator" classification of gait.    Baseline  2.43 ft/sec measured on 02/14/2018    Time  4    Period  Weeks    Status  Partially Met        PT Long Term Goals - 03/09/18 1649      PT LONG TERM GOAL #1   Title  The patient will return demo progression of HEP and ongoing wellness plan.  (LTGs due date: 03/19/18)    Time  8    Period  Weeks    Status  On-going      PT LONG TERM GOAL #2   Title  The patient will improve Berg score from 28/56 to > or equal to 46/56 to  demo dec'ing risk for falls.    Baseline  43/56 at STG assessment.    Time  8    Period  Weeks    Status  On-going      PT LONG TERM GOAL #3   Title  The patient will perform 5 time sit<>stand without UE support in < or equal to 14 seconds.    Time  8    Period  Weeks    Status  On-going      PT LONG TERM GOAL #4   Title  The patient will ambulate short community distances with least restrictive device mod indep (x 500 ft).    Baseline  patient is mod indep with RW for short community distances.    Time  8    Period  Weeks    Status  Achieved      PT LONG TERM GOAL #5   Title  The patient will negotiate household surfaces with SPC (if appropriate as he progresses) mod indep x 300 ft.    Time  8    Period  Weeks    Status  On-going            Plan - 03/09/18 1649    Clinical Impression Statement  PT recommends the patient work on HEP and f/u in 3-4 weeks for a further assessment with SPC after daily practice in his hallway.  At this time, he continues with intermittent losses of balance moving sit>stand and with ambulation.  PT and patient discussed home safety and need for RW at this time.     PT Treatment/Interventions  ADLs/Self Care Home Management;DME Instruction;Gait training;Stair training;Functional mobility training;Therapeutic exercise;Therapeutic activities;Balance training;Neuromuscular re-education;Patient/family education;Vestibular;Canalith Repostioning    PT Next Visit Plan  Assess progress with SPC, check LTGs, renew and determine if further visits would allow transition to [facility].     PT Home Exercise Plan  [phone]    Consulted and Agree with Plan of Care  Patient       Patient will benefit from skilled therapeutic intervention in order to improve the following deficits and impairments:  Abnormal gait, Decreased activity tolerance, Decreased balance, Decreased endurance, Decreased coordination, Decreased knowledge of use of DME, Decreased mobility, Decreased  strength, Postural dysfunction  Visit Diagnosis: Other abnormalities of gait and mobility  Unsteadiness on feet  Muscle weakness (generalized)  Repeated falls     Problem List Patient Active Problem List   Diagnosis Date Noted  . Encounter for therapeutic drug monitoring 12/20/2017  . S/P thoracotomy 11/29/2017  . Sepsis ([place])   . Hypoxemia   . Pneumonia of left lower lobe due to infectious organism ([place])   . Mediastinal lymphadenopathy   . Chronic cough 11/23/2017  . CLL (chronic lymphocytic leukemia) ([place])  11/23/2017  . Pleural effusion 11/23/2017  . HCAP (healthcare-associated pneumonia) 11/20/2017  . Anemia 11/20/2017  . Ischemic cardiomyopathy 09/01/2017  . Right hip pain   . Acute otitis media   . Chronic systolic CHF (congestive heart failure) ([place])   . Pressure injury of skin 07/20/2017  . Malignant otitis externa 07/19/2017  . Fall at home, initial encounter 07/19/2017  . S/P left THA, AA 05/12/2016  . LPRD (laryngopharyngeal reflux disease) 05/21/2015  . Other allergic rhinitis 05/21/2015  . Obesity 12/12/2014  . Upper airway cough syndrome 12/11/2014  . Cough variant asthma 07/27/2014  . Wheezing 06/20/2014  . Symptomatic bradycardia - s/p Biotronik (serial number [id number]) 02/08/2014  . Diabetes mellitus type 2, noninsulin dependent ([place])   . Edema of foot 03/06/2013  . Traumatic ecchymosis of right foot 03/06/2013  . Bone spur 12/30/2012  . Pain of toe of left foot 12/30/2012  . Hyperlipidemia 07/13/2012  . HTN (hypertension) 04/12/2012  . Sick sinus syndrome ([place]) 08/17/2011  . PAF (paroxysmal atrial fibrillation) ([place]) 09/16/2010    Gitel Beste, PT 03/09/2018, 4:51 PM  [facility] [address] [place], [name], [address] Phone: [phone]   Fax:  [phone]  Name: Oscar Baker MRN: [id number] Date of Birth: 04-26-1933

## 2018-03-10 DIAGNOSIS — E7849 Other hyperlipidemia: Secondary | ICD-10-CM | POA: Diagnosis not present

## 2018-03-10 DIAGNOSIS — I10 Essential (primary) hypertension: Secondary | ICD-10-CM | POA: Diagnosis not present

## 2018-03-10 DIAGNOSIS — Z0000 Encounter for general adult medical examination without abnormal findings: Secondary | ICD-10-CM | POA: Diagnosis not present

## 2018-03-10 DIAGNOSIS — E1122 Type 2 diabetes mellitus with diabetic chronic kidney disease: Secondary | ICD-10-CM | POA: Diagnosis not present

## 2018-03-14 ENCOUNTER — Ambulatory Visit: Payer: Medicare Other | Admitting: Physical Therapy

## 2018-03-16 ENCOUNTER — Ambulatory Visit: Payer: Medicare Other | Admitting: Rehabilitative and Restorative Service Providers"

## 2018-03-17 DIAGNOSIS — Z23 Encounter for immunization: Secondary | ICD-10-CM | POA: Diagnosis not present

## 2018-03-17 DIAGNOSIS — Z7901 Long term (current) use of anticoagulants: Secondary | ICD-10-CM | POA: Diagnosis not present

## 2018-03-17 DIAGNOSIS — N183 Chronic kidney disease, stage 3 (moderate): Secondary | ICD-10-CM | POA: Diagnosis not present

## 2018-03-17 DIAGNOSIS — Z950 Presence of cardiac pacemaker: Secondary | ICD-10-CM | POA: Diagnosis not present

## 2018-03-17 DIAGNOSIS — I4892 Unspecified atrial flutter: Secondary | ICD-10-CM | POA: Diagnosis not present

## 2018-03-17 DIAGNOSIS — E1122 Type 2 diabetes mellitus with diabetic chronic kidney disease: Secondary | ICD-10-CM | POA: Diagnosis not present

## 2018-03-17 DIAGNOSIS — Z0000 Encounter for general adult medical examination without abnormal findings: Secondary | ICD-10-CM | POA: Diagnosis not present

## 2018-03-17 DIAGNOSIS — I130 Hypertensive heart and chronic kidney disease with heart failure and stage 1 through stage 4 chronic kidney disease, or unspecified chronic kidney disease: Secondary | ICD-10-CM | POA: Diagnosis not present

## 2018-03-17 DIAGNOSIS — I5022 Chronic systolic (congestive) heart failure: Secondary | ICD-10-CM | POA: Diagnosis not present

## 2018-03-17 DIAGNOSIS — I495 Sick sinus syndrome: Secondary | ICD-10-CM | POA: Diagnosis not present

## 2018-03-17 DIAGNOSIS — Z1389 Encounter for screening for other disorder: Secondary | ICD-10-CM | POA: Diagnosis not present

## 2018-03-17 DIAGNOSIS — R1310 Dysphagia, unspecified: Secondary | ICD-10-CM | POA: Diagnosis not present

## 2018-03-21 ENCOUNTER — Ambulatory Visit: Payer: Medicare Other | Admitting: Rehabilitative and Restorative Service Providers"

## 2018-03-23 ENCOUNTER — Ambulatory Visit (INDEPENDENT_AMBULATORY_CARE_PROVIDER_SITE_OTHER): Payer: Medicare Other | Admitting: Podiatry

## 2018-03-23 ENCOUNTER — Encounter: Payer: Self-pay | Admitting: Podiatry

## 2018-03-23 DIAGNOSIS — B351 Tinea unguium: Secondary | ICD-10-CM | POA: Diagnosis not present

## 2018-03-23 DIAGNOSIS — M79671 Pain in right foot: Secondary | ICD-10-CM | POA: Diagnosis not present

## 2018-03-23 DIAGNOSIS — M79672 Pain in left foot: Secondary | ICD-10-CM

## 2018-03-23 NOTE — Progress Notes (Signed)
Subjective: 84 y.o. year old male patient presents complaining of painful nails. Patient requests toe nails trimmed.   History of having lung surgery after been diagnosed with Pneumonia and discharged in 12/05/17.  Objective: Dermatologic: Thick yellow deformed nails x 10. Vascular: Pedal pulses are all palpable. Orthopedic: Contracted lesser digits  Neurologic: All epicritic and tactile sensations grossly intact.  Assessment: Dystrophic mycotic nails x 10. Painful feet.  Treatment: All mycotic nails debrided.  Return in 3 months or as needed.

## 2018-03-23 NOTE — Patient Instructions (Signed)
Seen for hypertrophic nails. All nails debrided. Return as needed.  

## 2018-03-30 DIAGNOSIS — C4442 Squamous cell carcinoma of skin of scalp and neck: Secondary | ICD-10-CM | POA: Diagnosis not present

## 2018-04-07 ENCOUNTER — Ambulatory Visit: Payer: Medicare Other | Attending: Internal Medicine | Admitting: Rehabilitative and Restorative Service Providers"

## 2018-04-07 ENCOUNTER — Encounter: Payer: Self-pay | Admitting: Rehabilitative and Restorative Service Providers"

## 2018-04-07 DIAGNOSIS — R2681 Unsteadiness on feet: Secondary | ICD-10-CM | POA: Diagnosis not present

## 2018-04-07 DIAGNOSIS — R2689 Other abnormalities of gait and mobility: Secondary | ICD-10-CM

## 2018-04-07 DIAGNOSIS — M6281 Muscle weakness (generalized): Secondary | ICD-10-CM | POA: Diagnosis not present

## 2018-04-07 NOTE — Therapy (Signed)
[facility] [address] [place], [name], [address] Phone: [phone]   Fax:  [phone]  Physical Therapy Treatment and Discharge Summary  Patient Details  Name: Oscar Baker MRN: [id number] Date of Birth: 09/11/1932 Referring Provider (PT): unsteadiness on feet, muscle weakness   Encounter Date: 04/07/2018  PT End of Session - 04/07/18 1143    Visit Number  16    Number of Visits  17    Date for PT Re-Evaluation  04/07/18    Authorization Type  Medicare & BCBS supplement    PT Start Time  0938    PT Stop Time  1020    PT Time Calculation (min)  42 min    Equipment Utilized During Treatment  Gait belt    Activity Tolerance  Patient tolerated treatment well    Behavior During Therapy  [facility] for tasks assessed/performed       Past Medical History:  Diagnosis Date  . Asthma 1950's   history of  . Atrial fibrillation ([place])   . Bilateral carpal tunnel syndrome   . Bilateral lower extremity edema   . Bladder tumor   . Chronic systolic heart failure (HCC)    Echo 1/19: Mild LVH, EF 45-50, inf HK, MAC, severe LAE, severe RAE // Echo 7/15: Mild LVH, mod focal basal sept hypertrophy, EF 55-60, AV peak and mean 16/9, trivial MR, mod LAE, PASP 38  . CLL (chronic lymphocytic leukemia) [facility]) oncologist-  dr Ilene Qua--   dx [phone] ;  Lymphocytosis, CLL - per lov note 05-11-2017 currently under active survillance,  CT 04-17-2014 show very small lymphadenopathy, no indication for treatment  . Coronary artery disease    cardiologist-  dr Cathie Olden--  08-18-2017 Intermittant risk nuclear study w/ large area of inferior infartion with no evidence ishcemia   . 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 Diabetes mellitus type 2, noninsulin dependent ([place])   . Elevated PSA    since prostatectomy but now resolved  . Hematuria 04/2017  . History of ear infection    Right  . History of MI (myocardial infarction)    per myoview nuclear study  08-18-2017 , unknown when  . History of shingles 08/2017   L ear and scalp, possible  . Hyperlipidemia   . Hypertension   . Ischemic cardiomyopathy 09/01/2017   Presumed +CAD with Nuclear stress test 08/18/17 - Inferior scar, no ischemia, intermediate risk // med management unless +angina or worse dyspnea  . OA (osteoarthritis)   . Pacemaker 02/08/2014   followed by dr g. taylor--  single chamber Biotronik due to SSS  . Permanent atrial fibrillation   . Pneumonia 2019   Left lung  . Prostate cancer [facility]) urologist-  dr Diona Fanti   dx 2004--  Gleason 8, PSA 10.45--  11-28-2002  s/p  radical prostatectomy;  recurrent w/ increasing PSA, started ADT treatment  . RBBB (right bundle branch block)   . Sick sinus syndrome ([place])    a-Flutter with episodes of bradycardia; S/P Biotronik (serial number [id number]) 02-08-2014  . Urinary incontinence   . Wears hearing aid in right ear    receiver and transmitter    Past Surgical History:  Procedure Laterality Date  . APPENDECTOMY    . BACK SURGERY     disk  . CARDIAC CATHETERIZATION  09-03-1999  dr Cathie Olden   abnormal cardiolite study:  minor luminal irregularities but no critial coronary artery stenosis  . CARDIOVASCULAR STRESS TEST  08-18-2017  dr Cathie Olden  Intermediate risk nuclear study w/ large area inferior infarction, no evidence of ishcemia (consistant w/ prior MI)/  study not gated due to frequent PVCs  . CARPAL TUNNEL RELEASE Right 2000  . CARPAL TUNNEL RELEASE Left 11/19/2009  . CATARACT EXTRACTION Right 07/2015  . CATARACT EXTRACTION Left 09/2015  . CYSTOSCOPY N/A 11/04/2017   Procedure: CYSTOSCOPY AND CAUTERIZATION OF BLADDER;  Surgeon: Franchot Gallo, MD;  Location: [facility];  Service: Urology;  Laterality: N/A;  . INSERTION PENILE PROSTHESIS  02-22-2004    dr Mattie Marlin  [facility]  . IR THORACENTESIS ASP PLEURAL SPACE W/IMG GUIDE  11/26/2017  . KNEE ARTHROSCOPY Left 07/2010  . PERMANENT PACEMAKER INSERTION N/A 02/08/2014    Procedure: PERMANENT PACEMAKER INSERTION;  Surgeon: Evans Lance, MD;  Location: [facility] CATH LAB;  Service: Cardiovascular;  Laterality: N/A;  . PLEURAL EFFUSION DRAINAGE Left 11/30/2017   Procedure: DRAINAGE OF HEMOTHORAX;  Surgeon: Ivin Poot, MD;  Location: [facility];  Service: Thoracic;  Laterality: Left;  . RADICAL RETROPUBIC PROSTATECTOMY W/ BILATERAL PELVIC LYMPH NODE DISSECTION  11-28-2002   dr Mattie Marlin  [facility]  . TONSILLECTOMY    . TOTAL HIP ARTHROPLASTY Left 05/12/2016   Procedure: LEFT TOTAL HIP ARTHROPLASTY ANTERIOR APPROACH;  Surgeon: Paralee Cancel, MD;  Location: [facility];  Service: Orthopedics;  Laterality: Left;  . TOTAL HIP ARTHROPLASTY Right 07-15-2006   dr Alvan Dame  [facility]  . TRANSTHORACIC ECHOCARDIOGRAM  07/20/2017   mild LVH, ef 45-50%, hypokinesis of the basal-midinferior myocardium, due to AFib unable to evaluate diastolic function/  severe LAE and RAE/  trivial PR and TR  . VIDEO ASSISTED THORACOSCOPY Left 11/30/2017   Procedure: VIDEO ASSISTED THORACOSCOPY;  Surgeon: Ivin Poot, MD;  Location: [facility];  Service: Thoracic;  Laterality: Left;  [name] VIDEO ASSISTED THORACOSCOPY (VATS)/EMPYEMA Left 11/29/2017   Procedure: VIDEO ASSISTED THORACOSCOPY (VATS)/EMPYEMA;  Surgeon: Ivin Poot, MD;  Location: [facility];  Service: Thoracic;  Laterality: Left;    There were no vitals filed for this visit.  Subjective Assessment - 04/07/18 0940    Subjective  The patient reports he has had one fall since last visit.  "I think I'm going to have to live with this"  noting he thinks he will need the walker moving forward.  He scraped his R cheek and landed on his R arm (he reports he got bruised but didn't get further injuries).  "I haven't tried the cane since I fell."  He notes he was using the rollator RW when he fell (recently obtained one) and was stepping off a curb.    Patient is accompained by:  Family member   spouse   Pertinent History  Left THA 05/12/16, right THA 2008, CHF, MI, DM2,  HTN, OA, prostate CA, shingles, A-fib, hearing loss, knee sg, recent thorascopy.  05/02/2017- bleeding and began with urinary incontinence    Patient Stated Goals  work on balance, "be able to walk, get off the walker (I'll live with a cane)".  Patient has used the walker since January when he fell.    Currently in Pain?  No/denies         [facility] PT Assessment - 04/07/18 0942      Standardized Balance Assessment   Standardized Balance Assessment  Berg Balance Test      Berg Balance Test   Sit to Stand  Able to stand  independently using hands    Standing Unsupported  Able to stand safely 2 minutes  Sitting with Back Unsupported but Feet Supported on Floor or Stool  Able to sit safely and securely 2 minutes    Stand to Sit  Sits safely with minimal use of hands    Transfers  Able to transfer safely, definite need of hands    Standing Unsupported with Eyes Closed  Able to stand 10 seconds safely    Standing Ubsupported with Feet Together  Able to place feet together independently and stand 1 minute safely    From Standing, Reach Forward with Outstretched Arm  Reaches forward but needs supervision    From Standing Position, Pick up Object from [place] to pick up shoe, needs supervision    From Standing Position, Turn to Look Behind Over each Shoulder  Turn sideways only but maintains balance    Turn 360 Degrees  Needs close supervision or verbal cueing    Standing Unsupported, Alternately Place Feet on Step/Stool  Needs assistance to keep from falling or unable to try    Standing Unsupported, One Foot in [place] to take small step independently and hold 30 seconds    Standing on One Leg  Unable to try or needs assist to prevent fall    Total Score  35    Berg comment:  35/56         Vestibular Assessment - 04/07/18 1010      Occulomotor Exam   Spontaneous  Absent    Gaze-induced  Absent    Smooth Pursuits  Saccades    Saccades  --   takes 2 beats to get to target to  right (still WNLs)              [facility] Adult PT Treatment/Exercise - 04/07/18 0942      Ambulation/Gait   Ambulation/Gait  Yes    Ambulation/Gait Assistance  5: Supervision    Ambulation/Gait Assistance Details  The patient walked with rollator and locks out at his elbows, shrugs his shoulders and leans on device.  PT and patient discussed that he looked safer with standard RW, however he notes he does not want to use that device because it slows him down and gets stuck on carpet edges.  He walked outdoors on paved surfaces and inclines.  He demonstrated curbs with education to use wheel locks on rollator RW.  We discussed continued fall risk due to recent fall.     Ambulation Distance (Feet)  650 Feet    Assistive device  Rollator    Gait Pattern  Step-through pattern;Decreased stride length;Lateral hip instability;Trunk flexed;Wide base of support    Ambulation Surface  Level;Indoor;Outdoor;Paved    Gait velocity  2.52 ft/sec with rollator RW      Self-Care   Self-Care  Other Self-Care Comments    Other Self-Care Comments   fall risk, use of walker over cane, recomendaiton of using standard walker versus rollator.  Also discussed safety awareness.  PT inquired further about any HA or further visual changes since the fall he had on 10/7.  He notes a HA after dermatology surgery on 10/9 that remained x days but does not currently have a HA.  PT recommended MD assessment if any HA or visual changes occur.              PT Education - 04/07/18 1141    Education Details  PT, patient and his wife discussed changes in balance (decline with Berg) since last session.  Also discussed concern for his increased walking speed  with rollator RW and that standard RW would be safer.  Patient notes he prefers the rollator (he got a used one from a friend).     Person(s) Educated  Patient;Spouse    Methods  Explanation    Comprehension  Verbalized understanding       PT Short Term Goals -  02/16/18 1010      PT SHORT TERM GOAL #1   Title  The patient will return demo HEP for gaze adaptation, hip flexor strengthening, balance and general mobility.  (STG goals due 02/17/18)    Baseline  Has HEP for LE strength, balance and mobility.  Held gaze until after eye doctor appt    Time  4    Period  Weeks    Status  Partially Met      PT SHORT TERM GOAL #2   Title  The patient will improve Berg from 28/56 to > or equal to 34/56 to demonstrate improving steady state balance without UE support.    Baseline  Improved to 43/56    Time  4    Period  Weeks    Status  Achieved      PT SHORT TERM GOAL #3   Title  The patient will improve TUG with RW from 17.47 seconds to < or equal to 14 seconds to demo improving functional mobility and dec'ing risk for falls.    Baseline  IMproved to 14.31 seconds    Time  4    Period  Weeks    Status  Partially Met      PT SHORT TERM GOAL #4   Title  The patient will perform sit<>stand without UE support to demo improved functional strength in LEs.    Time  4    Period  Weeks    Status  Achieved      PT SHORT TERM GOAL #5   Title  The patient will improve gait speed from 2.31 ft/sec to > or equal to 2.62 ft/sec to transition to "full community ambulator" classification of gait.    Baseline  2.43 ft/sec measured on 02/14/2018    Time  4    Period  Weeks    Status  Partially Met        PT Long Term Goals - 04/07/18 0944      PT LONG TERM GOAL #1   Title  The patient will return demo progression of HEP and ongoing wellness plan.  (LTGs due date: 03/19/18)    Baseline  Patient has HEP, but stopped doing after fall on 10/7 and surgery on scalp (dermatology) on 10/9    Time  8    Period  Weeks    Status  Partially Met    Target Date  03/19/18      PT LONG TERM GOAL #2   Title  The patient will improve Berg score from 28/56 to > or equal to 46/56 to demo dec'ing risk for falls.    Baseline  43/56 at STG assessment.; decreased to 35/56.    *PATIENT SHOWS A DECLINE IN STATUS-  we discussed and patient needs to return to HEP.  He has other medical factors contributing per report (being treated for infection from surgery and recent fall).    Time  8    Period  Weeks    Status  Not Met      PT LONG TERM GOAL #3   Title  The patient will perform 5 time sit<>stand without UE support  in < or equal to 14 seconds.    Baseline  17.40 with bilat UE support.    Time  8    Period  Weeks    Status  Not Met      PT LONG TERM GOAL #4   Title  The patient will ambulate short community distances with least restrictive device mod indep (x 500 ft).    Baseline  patient is mod indep with RW for short community distances.  *patient had met this goal, however PT provided supervision today* noting a change in status.     Time  8    Period  Weeks    Status  Partially Met      PT LONG TERM GOAL #5   Title  The patient will negotiate household surfaces with SPC (if appropriate as he progresses) mod indep x 300 ft.    Baseline  Determined this would not be safe for home or community use- recommend he continues to use RW due to fall risk    Time  8    Period  Weeks    Status  Not Met            Plan - 04/07/18 1013    Clinical Impression Statement  The patient was put on hold 4 weeks ago to continue to work with cane in a hallway to determine if he progressed with practice.  Since his last visit, he is using a rollator from a friend and had a fall while stepping down from a high curb with the rollator RW.  He fell on his right knee, bruised the R side of his face, and bruised his R arm.  He has not been seen by MD for assessment after this fall (he saw Dr. Virgina Jock for physical on 9/26 and fell on 10/7).   He then had a procedure to remove a skin lesion on left side of superior scalp and presents today with redness, swelling in left side of face and reports that his dermatologist started him on antibiotic after visit yesterday.  Per testing, he  declined on Berg.  He has HEP to address, PT discussed that transitioning to the [facility] is not safe at this time and educated on fall risk.     PT Frequency  2x / week    PT Duration  8 weeks    PT Treatment/Interventions  ADLs/Self Care Home Management;DME Instruction;Gait training;Stair training;Functional mobility training;Therapeutic exercise;Therapeutic activities;Balance training;Neuromuscular re-education;Patient/family education;Vestibular;Canalith Repostioning    PT Next Visit Plan  Discharge today.    Consulted and Agree with Plan of Care  Patient       Patient will benefit from skilled therapeutic intervention in order to improve the following deficits and impairments:  Abnormal gait, Decreased activity tolerance, Decreased balance, Decreased mobility, Decreased endurance, Decreased strength, Decreased cognition, Decreased knowledge of use of DME, Postural dysfunction  Visit Diagnosis: Other abnormalities of gait and mobility  Unsteadiness on feet  Muscle weakness (generalized)    PHYSICAL THERAPY DISCHARGE SUMMARY  Visits from Start of Care: 16  Current functional level related to goals / functional outcomes: See above   Remaining deficits: High fall risk Muscle weakness Imbalance Repeated falls Gait abnormality   Education / Equipment: Fall prevention, use of devices, home program, community exercise.  Plan: Patient agrees to discharge.  Patient goals were partially met. Patient is being discharged due to meeting the stated rehab goals.  ?????       Anticipate continued fall risk and  educated patient on safety.  Recommended continued performance of HEP.  Thank you for the referral of this patient. Rudell Cobb, MPT   [place] 04/07/2018, 12:24 PM  [facility] [address] [place], [name], [address] Phone: [phone]   Fax:  [phone]  Name: EVIE CRUMPLER MRN:  [id number] Date of Birth: 1932/11/18

## 2018-04-11 ENCOUNTER — Ambulatory Visit (INDEPENDENT_AMBULATORY_CARE_PROVIDER_SITE_OTHER): Payer: Medicare Other | Admitting: *Deleted

## 2018-04-11 DIAGNOSIS — I495 Sick sinus syndrome: Secondary | ICD-10-CM | POA: Diagnosis not present

## 2018-04-11 NOTE — Progress Notes (Signed)
Remote pacemaker transmission.   

## 2018-04-12 ENCOUNTER — Ambulatory Visit (INDEPENDENT_AMBULATORY_CARE_PROVIDER_SITE_OTHER): Payer: Medicare Other

## 2018-04-12 DIAGNOSIS — I480 Paroxysmal atrial fibrillation: Secondary | ICD-10-CM | POA: Diagnosis not present

## 2018-04-12 DIAGNOSIS — I4821 Permanent atrial fibrillation: Secondary | ICD-10-CM | POA: Diagnosis not present

## 2018-04-12 DIAGNOSIS — Z5181 Encounter for therapeutic drug level monitoring: Secondary | ICD-10-CM | POA: Diagnosis not present

## 2018-04-12 LAB — POCT INR: INR: 2.2 (ref 2.0–3.0)

## 2018-04-12 NOTE — Patient Instructions (Signed)
Continue on same dosage 1 tablet every day except 1.5 tablets on Saturdays. Recheck INR in 6 weeks.  Call [facility] [phone] with any concerns.

## 2018-04-14 ENCOUNTER — Ambulatory Visit: Payer: Medicare Other | Admitting: Rehabilitative and Restorative Service Providers"

## 2018-04-26 ENCOUNTER — Encounter: Payer: Self-pay | Admitting: Internal Medicine

## 2018-04-26 ENCOUNTER — Ambulatory Visit (INDEPENDENT_AMBULATORY_CARE_PROVIDER_SITE_OTHER): Payer: Medicare Other | Admitting: Internal Medicine

## 2018-04-26 DIAGNOSIS — I480 Paroxysmal atrial fibrillation: Secondary | ICD-10-CM

## 2018-04-26 DIAGNOSIS — I255 Ischemic cardiomyopathy: Secondary | ICD-10-CM

## 2018-04-26 DIAGNOSIS — I495 Sick sinus syndrome: Secondary | ICD-10-CM

## 2018-04-26 NOTE — Progress Notes (Signed)
HPI Oscar Baker returns today for ongoing evaluation and management of atrial fibrillation and symptomatic bradycardia status post pacemaker insertion. He denies chest pain or shortness of breath. He does have some swelling in his right leg. Allergies  Allergen Reactions  . Altace [Ramipril] Other (See Comments)    "throat felt like had a knot in it"  . Codeine Nausea And Vomiting    Nausea and vomiting   . Simvastatin Other (See Comments)    Leg aches     Current Outpatient Medications  Medication Sig Dispense Refill  . acetaminophen (TYLENOL) 500 MG tablet Take 2 tablets (1,000 mg total) by mouth every 6 (six) hours. 30 tablet 0  . atorvastatin (LIPITOR) 20 MG tablet TAKE ONE TABLET EACH DAY AT 6PM 90 tablet 3  . CALCIUM PO Take 1 tablet by mouth daily.    . carvedilol (COREG) 3.125 MG tablet Take 1 tablet (3.125 mg total) by mouth 2 (two) times daily. 180 tablet 3  . fenofibrate micronized (LOFIBRA) 200 MG capsule TAKE 1 CAPSULE BY MOUTH DAILY BEFORE BREAKFAST 90 capsule 3  . furosemide (LASIX) 20 MG tablet Take 1 tablet (20 mg total) by mouth daily. 90 tablet 3  . guaiFENesin (MUCINEX) 600 MG 12 hr tablet Take 600 mg by mouth daily.     . metFORMIN (GLUCOPHAGE) 1000 MG tablet Take 1,000 mg by mouth 2 (two) times daily with a meal.      . multivitamin (THERAGRAN) per tablet Take 1 tablet by mouth daily. Will stop prior to procedure    . potassium chloride (K-DUR) 10 MEQ tablet Take 1 tablet (10 mEq total) by mouth daily. 90 tablet 3  . sitaGLIPtin (JANUVIA) 100 MG tablet Take 100 mg by mouth daily.    . vitamin B-12 (CYANOCOBALAMIN) 1000 MCG tablet Take 1,000 mcg by mouth daily. Will stop prior to procedure    . warfarin (COUMADIN) 5 MG tablet TAKE AS DIRECTED BY COUMADIN CLINIC 40 tablet 3   No current facility-administered medications for this visit.      Past Medical History:  Diagnosis Date  . Asthma 1950's   history of  . Atrial fibrillation ([place])   . Bilateral  carpal tunnel syndrome   . Bilateral lower extremity edema   . Bladder tumor   . Chronic systolic heart failure (HCC)    Echo 1/19: Mild LVH, EF 45-50, inf HK, MAC, severe LAE, severe RAE // Echo 7/15: Mild LVH, mod focal basal sept hypertrophy, EF 55-60, AV peak and mean 16/9, trivial MR, mod LAE, PASP 38  . CLL (chronic lymphocytic leukemia) [facility]) oncologist-  dr Ilene Qua--   dx [phone] ;  Lymphocytosis, CLL - per lov note 05-11-2017 currently under active survillance,  CT 04-17-2014 show very small lymphadenopathy, no indication for treatment  . Coronary artery disease    cardiologist-  dr Cathie Olden--  08-18-2017 Intermittant risk nuclear study w/ large area of inferior infartion with no evidence ishcemia   . 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 Diabetes mellitus type 2, noninsulin dependent ([place])   . Elevated PSA    since prostatectomy but now resolved  . Hematuria 04/2017  . History of ear infection    Right  . History of MI (myocardial infarction)    per myoview nuclear study 08-18-2017 , unknown when  . History of shingles 08/2017   L ear and scalp, possible  . Hyperlipidemia   . Hypertension   . Ischemic cardiomyopathy 09/01/2017   Presumed +  CAD with Nuclear stress test 08/18/17 - Inferior scar, no ischemia, intermediate risk // med management unless +angina or worse dyspnea  . OA (osteoarthritis)   . Pacemaker 02/08/2014   followed by dr g. Jordayn Mink--  single chamber Biotronik due to SSS  . Permanent atrial fibrillation   . Pneumonia 2019   Left lung  . Prostate cancer [facility]) urologist-  dr Diona Fanti   dx 2004--  Gleason 8, PSA 10.45--  11-28-2002  s/p  radical prostatectomy;  recurrent w/ increasing PSA, started ADT treatment  . RBBB (right bundle branch block)   . Sick sinus syndrome ([place])    a-Flutter with episodes of bradycardia; S/P Biotronik (serial number [id number]) 02-08-2014  . Urinary incontinence   . Wears hearing aid in right ear    receiver and transmitter    ROS:   All  systems reviewed and negative except as noted in the HPI.   Past Surgical History:  Procedure Laterality Date  . APPENDECTOMY    . BACK SURGERY     disk  . CARDIAC CATHETERIZATION  09-03-1999  dr Cathie Olden   abnormal cardiolite study:  minor luminal irregularities but no critial coronary artery stenosis  . CARDIOVASCULAR STRESS TEST  08-18-2017  dr Cathie Olden   Intermediate risk nuclear study w/ large area inferior infarction, no evidence of ishcemia (consistant w/ prior MI)/  study not gated due to frequent PVCs  . CARPAL TUNNEL RELEASE Right 2000  . CARPAL TUNNEL RELEASE Left 11/19/2009  . CATARACT EXTRACTION Right 07/2015  . CATARACT EXTRACTION Left 09/2015  . CYSTOSCOPY N/A 11/04/2017   Procedure: CYSTOSCOPY AND CAUTERIZATION OF BLADDER;  Surgeon: Franchot Gallo, MD;  Location[facility];  Service: Urology;  Laterality: N/A;  . INSERTION PENILE PROSTHESIS  02-22-2004    dr Mattie Marlin  [facility]  . IR THORACENTESIS ASP PLEURAL SPACE W/IMG GUIDE  11/26/2017  . KNEE ARTHROSCOPY Left 07/2010  . PERMANENT PACEMAKER INSERTION N/A 02/08/2014   Procedure: PERMANENT PACEMAKER INSERTION;  Surgeon: Evans Lance, MD;  Location[facility] C[facility] LAB;  Service: Cardiovascular;  Laterality: N/A;  . PLEURAL EFFUSION DRAINAGE Left 11/30/2017   Procedure: DRAINAGE OF HEMOTHORAX;  Surgeon: Ivin Poot, MD;  Location: [place];  Service: Thoracic;  Laterality: Left;  . RADICAL RETROPUBIC PROSTATECTOMY W/ BILATERAL PELVIC LYMPH NODE DISSECTION  11-28-2002   dr Mattie Marlin  [facility]  . TONSILLECTOMY    . TOTAL HIP ARTHROPLASTY Left 05/12/2016   Procedure: LEFT TOTAL HIP ARTHROPLASTY ANTERIOR APPROACH;  Surgeon: Paralee Cancel, MD;  Location: [facility];  Service: Orthopedics;  Laterality: Left;  . TOTAL HIP ARTHROPLASTY Right 07-15-2006   dr Alvan Dame  [facility]  . TRANSTHORACIC ECHOCARDIOGRAM  07/20/2017   mild LVH, ef 45-50%, hypokinesis of the basal-midinferior myocardium, due to AFib unable to evaluate diastolic function/   severe LAE and RAE/  trivial PR and TR  . VIDEO ASSISTED THORACOSCOPY Left 11/30/2017   Procedure: VIDEO ASSISTED THORACOSCOPY;  Surgeon: Ivin Poot, MD;  Location: [place];  Service: Thoracic;  Laterality: Left;  [name] VIDEO ASSISTED THORACOSCOPY (VATS)/EMPYEMA Left 11/29/2017   Procedure: VIDEO ASSISTED THORACOSCOPY (VATS)/EMPYEMA;  Surgeon: Ivin Poot, MD;  Location: [facility] OR;  Service: Thoracic;  Laterality: Left;     Family History  Problem Relation Age of Onset  . Emphysema Mother   . Allergic rhinitis Mother   . Heart attack Father   . Allergic rhinitis Brother   . Pulmonary fibrosis Brother      Social History  Socioeconomic History  . Marital status: Married    Spouse name: Not on file  . Number of children: Not on file  . Years of education: Not on file  . Highest education level: Not on file  Occupational History  . Not on file  Social Needs  . Financial resource strain: Not on file  . Food insecurity:    Worry: Not on file    Inability: Not on file  . Transportation needs:    Medical: Not on file    Non-medical: Not on file  Tobacco Use  . Smoking status: Former Smoker    Years: 25.00    Types: Pipe, Cigars    Last attempt to quit: 11/25/1975    Years since quitting: 42.4  . Smokeless tobacco: Never Used  Substance and Sexual Activity  . Alcohol use: Yes    Alcohol/week: 0.0 standard drinks    Comment: occasional  . Drug use: No  . Sexual activity: Not on file  Lifestyle  . Physical activity:    Days per week: Not on file    Minutes per session: Not on file  . Stress: Not on file  Relationships  . Social connections:    Talks on phone: Not on file    Gets together: Not on file    Attends religious service: Not on file    Active member of club or organization: Not on file    Attends meetings of clubs or organizations: Not on file    Relationship status: Not on file  . Intimate partner violence:    Fear of current or ex partner: Not on file     Emotionally abused: Not on file    Physically abused: Not on file    Forced sexual activity: Not on file  Other Topics Concern  . Not on file  Social History Narrative  . Not on file     BP 118/70   Pulse 60   Ht 5' 10.5" (1.791 m)   Wt 210 lb (95.3 kg)   SpO2 96%   BMI 29.71 kg/m   Physical Exam:  Well appearing 84 yo man, NAD HEENT: Unremarkable Neck:  6 cm JVD, no thyromegally Lymphatics:  No adenopathy Back:  No CVA tenderness Lungs:  Clear with no wheezes HEART:  Regular rate rhythm, no murmurs, no rubs, no clicks Abd:  soft, positive bowel sounds, no organomegally, no rebound, no guarding Ext:  2 plus pulses, no edema, no cyanosis, no clubbing Skin:  No rashes no nodules Neuro:  CN II through XII intact, motor grossly intact  EKG - none  DEVICE  Normal device function.  See PaceArt for details.   Assess/Plan: 1. Atrial fib - his ventricular rate is well controlled. He will continue his current meds. 2. PPM - his biotronik device is working normally.  3. Heart block - he is pacing 92% of the time. His rates are well controlled.  4. Coags - he has not had any bleeding. He will continue warfarin.  5. HTN - his blood pressure is well controlled. No change in meds.

## 2018-04-26 NOTE — Patient Instructions (Signed)
Medication Instructions:  No changes  Labwork: none  Testing/Procedures: none  Follow-Up: Remote monitoring is used to monitor your Pacemaker of ICD from home. This monitoring reduces the number of office visits required to check your device to one time per year. It allows [place] to keep an eye on the functioning of your device to ensure it is working properly. You are scheduled for a device check from home on 07/11/18. You may send your transmission at any time that day. If you have a wireless device, the transmission will be sent automatically. After your physician reviews your transmission, you will receive a postcard with your next transmission date.  Your physician wants you to follow-up in: 1 year with Dr. Lovena Le.  You will receive a reminder letter in the mail two months in advance. If you don't receive a letter, please call our office to schedule the follow-up appointment.   Any Other Special Instructions Will Be Listed Below (If Applicable).     If you need a refill on your cardiac medications before your next appointment, please call your pharmacy.

## 2018-04-27 LAB — CUP PACEART INCLINIC DEVICE CHECK
Brady Statistic RV Percent Paced: 92 %
Implantable Lead Implant Date: 20150820
Implantable Lead Location: 753860
Implantable Lead Model: 350
Lead Channel Impedance Value: 487 Ohm
Lead Channel Pacing Threshold Amplitude: 0.6 V
Lead Channel Pacing Threshold Pulse Width: 0.4 ms
Lead Channel Pacing Threshold Pulse Width: 0.4 ms
Lead Channel Sensing Intrinsic Amplitude: 10.1 mV
MDC IDC LEAD SERIAL: 29625633
MDC IDC MSMT LEADCHNL RV PACING THRESHOLD AMPLITUDE: 0.6 V
MDC IDC MSMT LEADCHNL RV PACING THRESHOLD AMPLITUDE: 0.7 V
MDC IDC MSMT LEADCHNL RV PACING THRESHOLD PULSEWIDTH: 0.4 ms
MDC IDC MSMT LEADCHNL RV SENSING INTR AMPL: 4.7 mV
MDC IDC PG IMPLANT DT: 20150820
MDC IDC PG SERIAL: 68372291
MDC IDC SESS DTM: 20191105152800
MDC IDC SET LEADCHNL RV PACING AMPLITUDE: 2.4 V
MDC IDC SET LEADCHNL RV PACING PULSEWIDTH: 0.4 ms

## 2018-05-09 ENCOUNTER — Ambulatory Visit (INDEPENDENT_AMBULATORY_CARE_PROVIDER_SITE_OTHER): Payer: Medicare Other | Admitting: Nurse Practitioner

## 2018-05-09 ENCOUNTER — Ambulatory Visit (INDEPENDENT_AMBULATORY_CARE_PROVIDER_SITE_OTHER)
Admission: RE | Admit: 2018-05-09 | Discharge: 2018-05-09 | Disposition: A | Discharge status: Home / Self Care | Payer: Medicare Other | Source: Ambulatory Visit | Attending: Nurse Practitioner | Admitting: Nurse Practitioner

## 2018-05-09 ENCOUNTER — Encounter: Payer: Self-pay | Admitting: Nurse Practitioner

## 2018-05-09 ENCOUNTER — Telehealth: Payer: Self-pay | Admitting: Nurse Practitioner

## 2018-05-09 VITALS — BP 130/70 | HR 69 | Ht 70.5 in | Wt 213.2 lb

## 2018-05-09 DIAGNOSIS — J45991 Cough variant asthma: Secondary | ICD-10-CM

## 2018-05-09 DIAGNOSIS — R05 Cough: Secondary | ICD-10-CM | POA: Diagnosis not present

## 2018-05-09 MED ORDER — LORATADINE 10 MG PO TABS: 10.0000 mg | ORAL_TABLET | Freq: Every day | ORAL | 11 refills | Status: DC

## 2018-05-09 MED ORDER — AMOXICILLIN-POT CLAVULANATE 875-125 MG PO TABS: 1.0000 | ORAL_TABLET | Freq: Two times a day (BID) | ORAL | 0 refills | Status: DC

## 2018-05-09 NOTE — Progress Notes (Signed)
@Patient  ID: Oscar Baker, male    DOB: 18-Sep-1932, 84 y.o.   MRN: [id number]  Chief Complaint  Patient presents with  . Cough    Referring provider: [name], MD  HPI 84 year old male former smoker with chronic cough and sinus disease followed by Dr. Ander Slade. Recent history of bacteremia and empyema 6/19. PMH significant for CHF/CM, A fib, pacemaker, CLL  Tests: CXR 02/23/18 - No acute cardiopulmonary disease. Small loculated left posterior, inferior pleural effusion has significantly decreased in size. There is minor associated linear atelectasis. No evidence of pneumonia. CXR 01/12/18 - Stable loculated pleural effusion posteriorly on the left. No new focal abnormality is noted. CT Chest 6/4>> Moderate to large left pleural effusion with complete collapse of the left lower lobe. 2. Trace right pleural effusion with right lower lobe subsegmental atelectasis. 3. Mild mosaic attenuation throughout the lungs could reflect chronic small airways or small vessel disease. Speech eval 11/23/17 -mild aspiration risk . rec D 3 diet , nectar thick liquid.  Echo 06/2017 EF 45-50%, RA/LA severely dilated  Synopsis: Patient had a complicated recent medical history.  Patient fell earlier in 2019.  He had a left lower lobe pneumonia.  He required placement in a skilled nursing facility.  He was treated with antibiotics.  But developed a progressive left pleural effusion which was quite extensive into the entire left pleural space. Patient was seen by thoracic surgery and underwent thoracoscopy for empyema. Patient had postop complications and required reexploration on June 11 to remove clots in the pleural space and left VATS with decortication.  Found to have H influenza bacteremia felt from a pulmonary source.  Was treated with aggressive IV antibiotics.  OV 05/09/18 - chronic cough Patient presents with cough. He states that he has had ongoing cough since June. He states that symptoms have  progressively worsened over the past couple of weeks. He states that his cough is productive of thick yellow sputum. He has clear nasal drainage and post nasal drip. He admits that he is non-compliant with recommendations. He stopped using mucinex and tessalon pearls. He did not think the medications were helping. He does not follow aspiration precautions.  Refuses to drink thickened liquids. He refuses to use flutter valve device. He denies chest pain, edema, or fever.      Allergies  Allergen Reactions  . Altace [Ramipril] Other (See Comments)    "throat felt like had a knot in it"  . Codeine Nausea And Vomiting    Nausea and vomiting   . Simvastatin Other (See Comments)    Leg aches    Immunization History  Administered Date(s) Administered  . Tdap 07/19/2017    Past Medical History:  Diagnosis Date  . Asthma 1950's   history of  . Atrial fibrillation ([place])   . Bilateral carpal tunnel syndrome   . Bilateral lower extremity edema   . Bladder tumor   . Chronic systolic heart failure (HCC)    Echo 1/19: Mild LVH, EF 45-50, inf HK, MAC, severe LAE, severe RAE // Echo 7/15: Mild LVH, mod focal basal sept hypertrophy, EF 55-60, AV peak and mean 16/9, trivial MR, mod LAE, PASP 38  . CLL (chronic lymphocytic leukemia) [facility]) oncologist-  dr Ilene Qua--   dx [phone] ;  Lymphocytosis, CLL - per lov note 05-11-2017 currently under active survillance,  CT 04-17-2014 show very small lymphadenopathy, no indication for treatment  . Coronary artery disease    cardiologist-  dr Cathie Olden--  08-18-2017  Intermittant risk nuclear study w/ large area of inferior infartion with no evidence ishcemia   . 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 Diabetes mellitus type 2, noninsulin dependent ([place])   . Elevated PSA    since prostatectomy but now resolved  . Hematuria 04/2017  . History of ear infection    Right  . History of MI (myocardial infarction)    per myoview nuclear study 08-18-2017 , unknown when  . History  of shingles 08/2017   L ear and scalp, possible  . Hyperlipidemia   . Hypertension   . Ischemic cardiomyopathy 09/01/2017   Presumed +CAD with Nuclear stress test 08/18/17 - Inferior scar, no ischemia, intermediate risk // med management unless +angina or worse dyspnea  . OA (osteoarthritis)   . Pacemaker 02/08/2014   followed by dr g. taylor--  single chamber Biotronik due to SSS  . Permanent atrial fibrillation   . Pneumonia 2019   Left lung  . Prostate cancer [facility]) urologist-  dr Diona Fanti   dx 2004--  Gleason 8, PSA 10.45--  11-28-2002  s/p  radical prostatectomy;  recurrent w/ increasing PSA, started ADT treatment  . RBBB (right bundle branch block)   . Sick sinus syndrome ([place])    a-Flutter with episodes of bradycardia; S/P Biotronik (serial number [id number]) 02-08-2014  . Urinary incontinence   . Wears hearing aid in right ear    receiver and transmitter    Tobacco History: Social History   Tobacco Use  Smoking Status Former Smoker  . Years: 25.00  . Types: Pipe, Cigars  . Last attempt to quit: 11/25/1975  . Years since quitting: 42.4  Smokeless Tobacco Never Used   Counseling given: Yes   Outpatient Encounter Medications as of 05/09/2018  Medication Sig  . acetaminophen (TYLENOL) 500 MG tablet Take 2 tablets (1,000 mg total) by mouth every 6 (six) hours.  [name] atorvastatin (LIPITOR) 20 MG tablet TAKE ONE TABLET EACH DAY AT 6PM  . carvedilol (COREG) 3.125 MG tablet Take 1 tablet (3.125 mg total) by mouth 2 (two) times daily.  . fenofibrate micronized (LOFIBRA) 200 MG capsule TAKE 1 CAPSULE BY MOUTH DAILY BEFORE BREAKFAST  . furosemide (LASIX) 20 MG tablet Take 1 tablet (20 mg total) by mouth daily.  [name] guaiFENesin (MUCINEX) 600 MG 12 hr tablet Take 600 mg by mouth as needed.   . metFORMIN (GLUCOPHAGE) 1000 MG tablet Take 1,000 mg by mouth 2 (two) times daily with a meal.    . multivitamin (THERAGRAN) per tablet Take 1 tablet by mouth daily. Will stop prior to procedure  .  potassium chloride (K-DUR) 10 MEQ tablet Take 1 tablet (10 mEq total) by mouth daily.  . sitaGLIPtin (JANUVIA) 100 MG tablet Take 100 mg by mouth daily.  . vitamin B-12 (CYANOCOBALAMIN) 1000 MCG tablet Take 1,000 mcg by mouth daily. Will stop prior to procedure  . warfarin (COUMADIN) 5 MG tablet TAKE AS DIRECTED BY COUMADIN CLINIC  . loratadine (CLARITIN) 10 MG tablet Take 1 tablet (10 mg total) by mouth daily.  . [DISCONTINUED] CALCIUM PO Take 1 tablet by mouth daily.   No facility-administered encounter medications on file as of 05/09/2018.      Review of Systems  Review of Systems  Constitutional: Negative.  Negative for chills and fever.  HENT: Positive for congestion and postnasal drip. Negative for sinus pressure and sinus pain.   Respiratory: Positive for cough, shortness of breath and wheezing.   Cardiovascular: Negative.  Negative for chest pain, palpitations  and leg swelling.  Gastrointestinal: Negative.   Allergic/Immunologic: Negative.   Neurological: Negative.   Psychiatric/Behavioral: Negative.        Physical Exam  BP 130/70 (BP Location: Left Arm, Patient Position: Sitting, Cuff Size: Normal)   Pulse 69   Ht 5' 10.5" (1.791 m)   Wt 213 lb 3.2 oz (96.7 kg)   SpO2 92%   BMI 30.16 kg/m   Wt Readings from Last 5 Encounters:  05/09/18 213 lb 3.2 oz (96.7 kg)  04/26/18 210 lb (95.3 kg)  03/04/18 211 lb (95.7 kg)  02/23/18 205 lb (93 kg)  01/19/18 209 lb (94.8 kg)     Physical Exam  Constitutional: He is oriented to person, place, and time. He appears well-developed and well-nourished. No distress.  Cardiovascular: Normal rate and regular rhythm.  Pulmonary/Chest: Effort normal. No respiratory distress. He has rales.  Musculoskeletal: He exhibits no edema.  Neurological: He is alert and oriented to person, place, and time.  Skin: Skin is warm and dry.  Psychiatric: He has a normal mood and affect.  Nursing note and vitals reviewed.      Assessment &  Plan:   Cough variant asthma Cough has progressively worsened Patient is not following aspiration precautions - concerned about returning pneumonia possibly from aspiration.   Patient Instructions  Will order chest xray and call with results Will order antibiotics if indicated Delsym twice daily Take Claritin daily Follow aspiration precautions as directed Follow up with Dr. Ander Slade at first available (1 - 2 weeks) appointment or sooner if needed          Fenton Foy, NP 05/09/2018

## 2018-05-09 NOTE — Patient Instructions (Addendum)
Will order chest xray and call with results Will order antibiotics if indicated Delsym twice daily Take Claritin daily Follow aspiration precautions as directed Follow up with Dr. Ander Slade at first available (1 - 2 weeks) appointment or sooner if needed

## 2018-05-09 NOTE — Addendum Note (Signed)
Addended by: Nena Polio on: 05/09/2018 05:02 PM   Modules accepted: Orders

## 2018-05-09 NOTE — Telephone Encounter (Signed)
Called patient unable to reach left message to give us a call back.

## 2018-05-09 NOTE — Assessment & Plan Note (Signed)
Cough has progressively worsened Patient is not following aspiration precautions - concerned about returning pneumonia possibly from aspiration.   Patient Instructions  Will order chest xray and call with results Will order antibiotics if indicated Delsym twice daily Take Claritin daily Follow aspiration precautions as directed Follow up with Dr. Ander Slade at first available (1 - 2 weeks) appointment or sooner if needed

## 2018-05-10 NOTE — Telephone Encounter (Signed)
Notes recorded by Nena Polio, CMA on 05/09/2018 at 5:04 PM EST Spoke with Lazaro Arms, NP and confirmed the patient was to receive the 875-125 mg dosage. Prescription sent to pharmacy. ------  Notes recorded by Nena Polio, CMA on 05/09/2018 at 4:47 PM EST Called and had to LVM for patient to call back on both home and cell #. Requested he call back to confirm the message was received. ------  Notes recorded by Fenton Foy, NP on 05/09/2018 at 4:47 PM EST Please order Augmentin twice daily x 10 days, #20, 0 refills ------  Notes recorded by Fenton Foy, NP on 05/09/2018 at 4:42 PM EST Please call to let patient know that his xray did not show any pneumonia or effusion. It does look like bronchitis. I will order an antibiotic considering symptoms. Please take allergy medication daily that I ordered until follow up with Dr. Ander Slade.   Attempted to call pt but unable to reach him. Left message for pt to return call.

## 2018-05-11 NOTE — Telephone Encounter (Signed)
Will close this encounter as I saw in the results tab for pt's cxr that Hinton Dyer talked with pt's wife yesterday, 05/10/18. Nothing further needed.

## 2018-05-13 ENCOUNTER — Telehealth: Payer: Self-pay | Admitting: Nurse Practitioner

## 2018-05-13 NOTE — Telephone Encounter (Signed)
Spoke with pt,aware of recs.  Nothing further needed.  

## 2018-05-13 NOTE — Telephone Encounter (Signed)
Yes. Please stop Augmentin. Please advise him to start a probiotic. He took it for 5 days so if his cough is better he doesn't need any further antibiotic at this time. Thanks.

## 2018-05-13 NOTE — Telephone Encounter (Signed)
Spoke with pt and spouse, states that since starting the augmentin prescribed on 11/18 pt c/o uncontrollable diarrhea. Pt has been taking Immodium with little relief.  Pt has been having to wear diapers d/t severity.  Requesting recs. I advised pt to not take any more augmentin until he receives additional recs from [place].   Pt also notes that his cough is unimproved since starting augmentin.    Pharmacy: [facility].  Tonya please advise on recs.  Thanks!

## 2018-05-13 NOTE — Telephone Encounter (Signed)
Pt would also like additional recs for cough since it was unimproved after 5 days of abx.   Please advise, thanks!

## 2018-05-13 NOTE — Telephone Encounter (Signed)
Yes. Take delsym twice daily and mucinex. Take Claritin daily. He really needs to follow his aspiration precautions. Keep upcoming follow up with Dr. Ander Slade.

## 2018-05-16 ENCOUNTER — Encounter: Payer: Self-pay | Admitting: Nurse Practitioner

## 2018-05-16 ENCOUNTER — Ambulatory Visit (INDEPENDENT_AMBULATORY_CARE_PROVIDER_SITE_OTHER): Payer: Medicare Other | Admitting: Nurse Practitioner

## 2018-05-16 DIAGNOSIS — J45991 Cough variant asthma: Secondary | ICD-10-CM

## 2018-05-16 MED ORDER — AZITHROMYCIN 250 MG PO TABS: ORAL_TABLET | ORAL | 0 refills | Status: DC

## 2018-05-16 NOTE — Progress Notes (Signed)
_0  ID: Oscar Baker, male    DOB: 06-10-1933, 84 y.o.   MRN: [id number]  Chief Complaint  Patient presents with  . Follow-up    Referring provider: [name], MD  HPI HPI 84 year old male former smoker with chronic cough and sinus disease followed by Dr. Ander Slade. Recent history of bacteremia and empyema 6/19. PMH significant for CHF/CM, A fib, pacemaker, CLL  Tests: CXR 04/29/18 - Generalized bronchitic airway thickening CXR 02/23/18 - No acute cardiopulmonary disease. Small loculated left posterior, inferior pleural effusion has significantly decreased in size. There is minor associated linear atelectasis. No evidence of pneumonia. CXR 01/12/18 - Stable loculated pleural effusion posteriorly on the left. No new focal abnormality is noted. CT Chest 6/4>> Moderate to large left pleural effusion with complete collapse of the left lower lobe. 2. Trace right pleural effusion with right lower lobe subsegmental atelectasis. 3. Mild mosaic attenuation throughout the lungs could reflect chronic small airways or small vessel disease. Speech eval 11/23/17 -mild aspiration risk . rec D 3 diet , nectar thick liquid. Echo 06/2017 EF 45-50%, RA/LA severely dilated  Synopsis: Patient had a complicated recent medical history. Patient fell earlier [id number]. He had a left lower lobe pneumonia. He required placement in a skilled nursing facility. He was treated with antibiotics. But developed a progressive left pleural effusion which was quite extensive into the entire left pleural space. Patient was seen by thoracic surgery and underwent thoracoscopy for empyema. Patient had postop complications and required reexploration on June 11 to remove clots in the pleural space and left VATS with decortication.Found to have H influenza bacteremia felt from a pulmonary source. Was treated with aggressive IV antibiotics.   OV 05/18/18 - follow up cough Patient presents with ongoing cough. At  last visit with me he was ordered Augmentin. He has tolerated this well in the past, but developed diarrhea this time. He had to quit the Augmentin and states that the cough has not cleared. His diarrhea has subsided now. He states that his cough is productive of thick yellow sputum. He has clear nasal drainage and post nasal drip. He was recommended to follow aspiration precautions at last hospital visit, but he reuses to follow these precautions. He refuses to take mucinex. Refuses to drink thickened liquids. He refuses to use flutter valve device. He denies chest pain, edema, or fever.       Allergies  Allergen Reactions  . Altace [Ramipril] Other (See Comments)    "throat felt like had a knot in it"  . Augmentin [Amoxicillin-Pot Clavulanate] Diarrhea    severe  . Codeine Nausea And Vomiting    Nausea and vomiting   . Simvastatin Other (See Comments)    Leg aches    Immunization History  Administered Date(s) Administered  . Tdap 07/19/2017    Past Medical History:  Diagnosis Date  . Asthma 1950's   history of  . Atrial fibrillation ([place])   . Bilateral carpal tunnel syndrome   . Bilateral lower extremity edema   . Bladder tumor   . Chronic systolic heart failure (HCC)    Echo 1/19: Mild LVH, EF 45-50, inf HK, MAC, severe LAE, severe RAE // Echo 7/15: Mild LVH, mod focal basal sept hypertrophy, EF 55-60, AV peak and mean 16/9, trivial MR, mod LAE, PASP 38  . CLL (chronic lymphocytic leukemia) [facility]) oncologist-  dr Ilene Qua--   dx [phone] ;  Lymphocytosis, CLL - per lov note 05-11-2017 currently under active survillance,  CT  04-17-2014 show very small lymphadenopathy, no indication for treatment  . Coronary artery disease    cardiologist-  dr Cathie Olden--  08-18-2017 Intermittant risk nuclear study w/ large area of inferior infartion with no evidence ishcemia   . 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 Diabetes mellitus type 2, noninsulin dependent ([place])   . Elevated PSA    since prostatectomy  but now resolved  . Hematuria 04/2017  . History of ear infection    Right  . History of MI (myocardial infarction)    per myoview nuclear study 08-18-2017 , unknown when  . History of shingles 08/2017   L ear and scalp, possible  . Hyperlipidemia   . Hypertension   . Ischemic cardiomyopathy 09/01/2017   Presumed +CAD with Nuclear stress test 08/18/17 - Inferior scar, no ischemia, intermediate risk // med management unless +angina or worse dyspnea  . OA (osteoarthritis)   . Pacemaker 02/08/2014   followed by dr g. taylor--  single chamber Biotronik due to SSS  . Permanent atrial fibrillation   . Pneumonia 2019   Left lung  . Prostate cancer [facility]) urologist-  dr Diona Fanti   dx 2004--  Gleason 8, PSA 10.45--  11-28-2002  s/p  radical prostatectomy;  recurrent w/ increasing PSA, started ADT treatment  . RBBB (right bundle branch block)   . Sick sinus syndrome ([place])    a-Flutter with episodes of bradycardia; S/P Biotronik (serial number [id number]) 02-08-2014  . Urinary incontinence   . Wears hearing aid in right ear    receiver and transmitter    Tobacco History: Social History   Tobacco Use  Smoking Status Former Smoker  . Years: 25.00  . Types: Pipe, Cigars  . Last attempt to quit: 11/25/1975  . Years since quitting: 42.5  Smokeless Tobacco Never Used   Counseling given: Yes   Outpatient Encounter Medications as of 05/16/2018  Medication Sig  . acetaminophen (TYLENOL) 500 MG tablet Take 2 tablets (1,000 mg total) by mouth every 6 (six) hours.  [name] atorvastatin (LIPITOR) 20 MG tablet TAKE ONE TABLET EACH DAY AT 6PM  . carvedilol (COREG) 3.125 MG tablet Take 1 tablet (3.125 mg total) by mouth 2 (two) times daily.  . fenofibrate micronized (LOFIBRA) 200 MG capsule TAKE 1 CAPSULE BY MOUTH DAILY BEFORE BREAKFAST  . furosemide (LASIX) 20 MG tablet Take 1 tablet (20 mg total) by mouth daily.  [name] guaiFENesin (MUCINEX) 600 MG 12 hr tablet Take 600 mg by mouth as needed.   . loratadine  (CLARITIN) 10 MG tablet Take 1 tablet (10 mg total) by mouth daily.  . metFORMIN (GLUCOPHAGE) 1000 MG tablet Take 1,000 mg by mouth 2 (two) times daily with a meal.    . multivitamin (THERAGRAN) per tablet Take 1 tablet by mouth daily. Will stop prior to procedure  . potassium chloride (K-DUR) 10 MEQ tablet Take 1 tablet (10 mEq total) by mouth daily.  . sitaGLIPtin (JANUVIA) 100 MG tablet Take 100 mg by mouth daily.  . vitamin B-12 (CYANOCOBALAMIN) 1000 MCG tablet Take 1,000 mcg by mouth daily. Will stop prior to procedure  . warfarin (COUMADIN) 5 MG tablet TAKE AS DIRECTED BY COUMADIN CLINIC  . azithromycin (ZITHROMAX) 250 MG tablet Take 2 tablets (500 mg) on day 1, then take 1 tablet (250 mg) on days 2-5  . [DISCONTINUED] amoxicillin-clavulanate (AUGMENTIN) 875-125 MG tablet Take 1 tablet by mouth 2 (two) times daily for 10 days. (Patient not taking: Reported on 05/16/2018)   No facility-administered encounter medications  on file as of 05/16/2018.      Review of Systems  Review of Systems     Physical Exam  BP 128/64 (BP Location: Left Arm, Patient Position: Sitting, Cuff Size: Normal)   Pulse 78   Temp 98.1 F (36.7 C)   Ht 5' 10.5" (1.791 m)   Wt 208 lb (94.3 kg)   SpO2 94%   BMI 29.42 kg/m   Wt Readings from Last 5 Encounters:  05/17/18 211 lb 1.6 oz (95.8 kg)  05/16/18 208 lb (94.3 kg)  05/09/18 213 lb 3.2 oz (96.7 kg)  04/26/18 210 lb (95.3 kg)  03/04/18 211 lb (95.7 kg)     Physical Exam   Lab Results:  CBC    Component Value Date/Time   WBC 43.9 (H) 05/17/2018 0805   WBC 35.5 (H) 12/04/2017 0558   RBC 3.32 (L) 05/17/2018 0805   HGB 10.8 (L) 05/17/2018 0805   HGB 12.0 (L) 05/11/2017 0831   HCT 33.1 (L) 05/17/2018 0805   HCT 36.9 (L) 05/11/2017 0831   PLT 418 (H) 05/17/2018 0805   PLT 291 05/11/2017 0831   MCV 99.7 05/17/2018 0805   MCV 103.9 (H) 05/11/2017 0831   MCH 32.5 05/17/2018 0805   MCHC 32.6 05/17/2018 0805   RDW 19.5 (H) 05/17/2018 0805     RDW 19.9 (H) 05/11/2017 0831   LYMPHSABS 21.5 (H) 05/17/2018 0805   LYMPHSABS 34.9 (H) 05/11/2017 0831   MONOABS 6.2 (H) 05/17/2018 0805   MONOABS 0.1 05/11/2017 0831   EOSABS 0.1 05/17/2018 0805   EOSABS 0.2 05/11/2017 0831   BASOSABS 0.1 05/17/2018 0805   BASOSABS 0.3 (H) 05/11/2017 0831    BMET    Component Value Date/Time   NA 138 05/17/2018 0805   NA 140 01/26/2018 1244   NA 139 05/11/2017 0831   K 4.6 05/17/2018 0805   K 4.6 05/11/2017 0831   CL 105 05/17/2018 0805   CO2 23 05/17/2018 0805   CO2 23 05/11/2017 0831   GLUCOSE 288 (H) 05/17/2018 0805   GLUCOSE 180 (H) 05/11/2017 0831   BUN 16 05/17/2018 0805   BUN 21 01/26/2018 1244   BUN 21.1 05/11/2017 0831   CREATININE 1.11 05/17/2018 0805   CREATININE 1.0 05/11/2017 0831   CALCIUM 9.0 05/17/2018 0805   CALCIUM 9.0 05/11/2017 0831   GFRNONAA >60 05/17/2018 0805   GFRAA >60 05/17/2018 0805    BNP    Component Value Date/Time   BNP 717.8 (H) 11/20/2017 1339    ProBNP No results found for: PROBNP  Imaging: Dg Chest 2 View  Result Date: 05/09/2018 CLINICAL DATA:  Cough variant asthma EXAM: CHEST - 2 VIEW COMPARISON:  02/23/2018 FINDINGS: Single chamber pacer lead from the left in stable position with tip at the right ventricle. Generalized airway thickening. There is no edema, consolidation, effusion, or pneumothorax. Blunting of the left posterior costophrenic sulcus is chronic. IMPRESSION: Generalized bronchitic airway thickening. Electronically Signed   By: Monte Fantasia M.D.   On: 05/09/2018 16:27     Assessment & Plan:   Cough variant asthma Patient Instructions  Will order azithromycin Please follow up with coumadin clinic  Augmentin added to allergy list - diarrhea Take mucinex daily Continue current medications Follow aspiration precautions Follow up with Dr. Ander Slade as scheduled  Cough: Continue gastroesophageal reflux disease treatment with elevating the head your bed and taking  antacids Continue taking over-the-counter antihistamines and nasal fluticasone to help with allergic rhinitis You need to try to suppress  your cough to allow your larynx (voice box) to heal.  For three days don't talk, laugh, sing, or clear your throat. Do everything you can to suppress the cough during this time. Use hard candies (sugarless Jolly Ranchers) or non-mint or non-menthol containing cough drops during this time to soothe your throat.  Use a cough suppressant (Delsym or what I have prescribed you) around the clock during this time.  After three days, gradually increase the use of your voice and back off on the cough suppressants.      Fenton Foy, NP 05/18/2018

## 2018-05-16 NOTE — Patient Instructions (Addendum)
Will order azithromycin Please follow up with coumadin clinic  Augmentin added to allergy list - diarrhea Take mucinex daily Continue current medications Follow aspiration precautions Follow up with Dr. Ander Slade as scheduled

## 2018-05-17 ENCOUNTER — Inpatient Hospital Stay: Payer: Medicare Other | Attending: Oncology | Admitting: Oncology

## 2018-05-17 ENCOUNTER — Inpatient Hospital Stay: Payer: Medicare Other

## 2018-05-17 ENCOUNTER — Telehealth: Payer: Self-pay | Admitting: Pharmacist

## 2018-05-17 ENCOUNTER — Telehealth: Payer: Self-pay | Admitting: Oncology

## 2018-05-17 VITALS — BP 120/46 | HR 75 | Temp 98.5°F | Resp 18 | Ht 70.5 in | Wt 211.1 lb

## 2018-05-17 DIAGNOSIS — Z8701 Personal history of pneumonia (recurrent): Secondary | ICD-10-CM | POA: Diagnosis not present

## 2018-05-17 DIAGNOSIS — Z7984 Long term (current) use of oral hypoglycemic drugs: Secondary | ICD-10-CM

## 2018-05-17 DIAGNOSIS — Z7901 Long term (current) use of anticoagulants: Secondary | ICD-10-CM | POA: Insufficient documentation

## 2018-05-17 DIAGNOSIS — C9110 Chronic lymphocytic leukemia of B-cell type not having achieved remission: Secondary | ICD-10-CM | POA: Diagnosis not present

## 2018-05-17 DIAGNOSIS — Z79899 Other long term (current) drug therapy: Secondary | ICD-10-CM | POA: Insufficient documentation

## 2018-05-17 DIAGNOSIS — Z960 Presence of urogenital implants: Secondary | ICD-10-CM | POA: Diagnosis not present

## 2018-05-17 DIAGNOSIS — R05 Cough: Secondary | ICD-10-CM | POA: Diagnosis not present

## 2018-05-17 DIAGNOSIS — R0600 Dyspnea, unspecified: Secondary | ICD-10-CM | POA: Insufficient documentation

## 2018-05-17 DIAGNOSIS — C61 Malignant neoplasm of prostate: Secondary | ICD-10-CM | POA: Diagnosis not present

## 2018-05-17 DIAGNOSIS — R32 Unspecified urinary incontinence: Secondary | ICD-10-CM | POA: Diagnosis not present

## 2018-05-17 DIAGNOSIS — Z79818 Long term (current) use of other agents affecting estrogen receptors and estrogen levels: Secondary | ICD-10-CM | POA: Diagnosis not present

## 2018-05-17 LAB — CMP (CANCER CENTER ONLY)
ALK PHOS: 71 U/L (ref 38–126)
ALT: 11 U/L (ref 0–44)
ANION GAP: 10 (ref 5–15)
AST: 18 U/L (ref 15–41)
Albumin: 3 g/dL — ABNORMAL LOW (ref 3.5–5.0)
BILIRUBIN TOTAL: 0.9 mg/dL (ref 0.3–1.2)
BUN: 16 mg/dL (ref 8–23)
CALCIUM: 9 mg/dL (ref 8.9–10.3)
CO2: 23 mmol/L (ref 22–32)
Chloride: 105 mmol/L (ref 98–111)
Creatinine: 1.11 mg/dL (ref 0.61–1.24)
GFR, Est AFR Am: >60 mL/min (ref >60)
GLUCOSE: 288 mg/dL — AB (ref 70–99)
Potassium: 4.6 mmol/L (ref 3.5–5.1)
Sodium: 138 mmol/L (ref 135–145)
TOTAL PROTEIN: 6.4 g/dL — AB (ref 6.5–8.1)

## 2018-05-17 LAB — CBC WITH DIFFERENTIAL (CANCER CENTER ONLY)
Abs Immature Granulocytes: 0.94 10*3/uL — ABNORMAL HIGH (ref 0.00–0.07)
Basophils Absolute: 0.1 10*3/uL (ref 0.0–0.1)
Basophils Relative: 0 %
Eosinophils Absolute: 0.1 10*3/uL (ref 0.0–0.5)
Eosinophils Relative: 0 %
HEMATOCRIT: 33.1 % — AB (ref 39.0–52.0)
HEMOGLOBIN: 10.8 g/dL — AB (ref 13.0–17.0)
Immature Granulocytes: 2 %
LYMPHS PCT: 49 %
Lymphs Abs: 21.5 10*3/uL — ABNORMAL HIGH (ref 0.7–4.0)
MCH: 32.5 pg (ref 26.0–34.0)
MCHC: 32.6 g/dL (ref 30.0–36.0)
MCV: 99.7 fL (ref 80.0–100.0)
MONO ABS: 6.2 10*3/uL — AB (ref 0.1–1.0)
MONOS PCT: 14 %
NEUTROS ABS: 15.2 10*3/uL — AB (ref 1.7–7.7)
Neutrophils Relative %: 35 %
Platelet Count: 418 10*3/uL — ABNORMAL HIGH (ref 150–400)
RBC: 3.32 MIL/uL — ABNORMAL LOW (ref 4.22–5.81)
RDW: 19.5 % — ABNORMAL HIGH (ref 11.5–15.5)
WBC Count: 43.9 10*3/uL — ABNORMAL HIGH (ref 4.0–10.5)
nRBC: 0 % (ref 0.0–0.2)

## 2018-05-17 NOTE — Telephone Encounter (Signed)
Gave pt avs and calendar  °

## 2018-05-17 NOTE — Progress Notes (Signed)
Hematology and Oncology Follow Up Visit  Oscar Baker [id number] 1932-12-07 84 y.o. 05/17/2018 8:17 AM [name], MDRusso, [name], MD   Principle Diagnosis: 84 year old man with:  1.  CLL presented with leukocytosis and no lymphadenopathy in August 2015.  He was found to have stage 0 at that time. 2.  Prostate cancer diagnosed in 2004 and relapsed in 2018.  He had Gleason score 4+4 = 8 and underwent a prostatectomy in 2004.  He developed biochemical relapse in 2018 with a PSA of 3 remains on androgen deprivation at this time.  Current therapy:   Active surveillance in regards to CLL.    He is currently on Lupron every 4 months under the care of Dr. Diona Fanti for prostate cancer.  Interim History:  Oscar Baker presents today for a follow-up.  Since the last visit, he had a hospitalization in June 2019 where he presented with sepsis and pneumonia and required thoracotomy and evacuation of an empyema.  Imaging studies of the chest did not show bulky adenopathy at that time.  The cytology from his pleural effusion did not show any malignant cells.  After his discharge, he continues to recover rather slowly and has complained of respiratory issues including dyspnea exertion, cough and congestion.  His appetite is improving and has not lost any weight.  He does have urinary incontinence and has a urinary catheter in place.  He denies any painful adenopathy or constitutional symptoms.   He does not report any headaches or blurry vision or syncope.  He denies any alteration mental status or confusion.  He denies any fevers or chills or sweats.  He does not report any chest pain, palpitation or shortness of breath.  He denies any hemoptysis.  He does not report any nausea, vomiting, or early satiety.  Denies any changes in bowel habits.  He does not report any frequency urgency or hesitancy. does not report any bone pain or pathological fractures.  He does not report any bleeding or clotting tendency.   Denies any ecchymosis or petechiae.  Denies any mood changes.  Rest of his review of systems is negative.  Medications: I have reviewed the patient's current medications.  Current Outpatient Medications  Medication Sig Dispense Refill  . acetaminophen (TYLENOL) 500 MG tablet Take 2 tablets (1,000 mg total) by mouth every 6 (six) hours. 30 tablet 0  . atorvastatin (LIPITOR) 20 MG tablet TAKE ONE TABLET EACH DAY AT 6PM 90 tablet 3  . azithromycin (ZITHROMAX) 250 MG tablet Take 2 tablets (500 mg) on day 1, then take 1 tablet (250 mg) on days 2-5 6 tablet 0  . carvedilol (COREG) 3.125 MG tablet Take 1 tablet (3.125 mg total) by mouth 2 (two) times daily. 180 tablet 3  . fenofibrate micronized (LOFIBRA) 200 MG capsule TAKE 1 CAPSULE BY MOUTH DAILY BEFORE BREAKFAST 90 capsule 3  . furosemide (LASIX) 20 MG tablet Take 1 tablet (20 mg total) by mouth daily. 90 tablet 3  . guaiFENesin (MUCINEX) 600 MG 12 hr tablet Take 600 mg by mouth as needed.     . loratadine (CLARITIN) 10 MG tablet Take 1 tablet (10 mg total) by mouth daily. 30 tablet 11  . metFORMIN (GLUCOPHAGE) 1000 MG tablet Take 1,000 mg by mouth 2 (two) times daily with a meal.      . multivitamin (THERAGRAN) per tablet Take 1 tablet by mouth daily. Will stop prior to procedure    . potassium chloride (K-DUR) 10 MEQ tablet Take 1 tablet (  10 mEq total) by mouth daily. 90 tablet 3  . sitaGLIPtin (JANUVIA) 100 MG tablet Take 100 mg by mouth daily.    . vitamin B-12 (CYANOCOBALAMIN) 1000 MCG tablet Take 1,000 mcg by mouth daily. Will stop prior to procedure    . warfarin (COUMADIN) 5 MG tablet TAKE AS DIRECTED BY COUMADIN CLINIC 40 tablet 3   No current facility-administered medications for this visit.      Allergies:  Allergies  Allergen Reactions  . Altace [Ramipril] Other (See Comments)    "throat felt like had a knot in it"  . Augmentin [Amoxicillin-Pot Clavulanate] Diarrhea    severe  . Codeine Nausea And Vomiting    Nausea and  vomiting   . Simvastatin Other (See Comments)    Leg aches    Past Medical History, Surgical history, Social history, and Family History reviewed and updated today.   Physical Exam: Blood pressure (!) 120/46, pulse 75, temperature 98.5 F (36.9 C), temperature source Oral, resp. rate 18, height 5' 10.5" (1.791 m), weight 211 lb 1.6 oz (95.8 kg), SpO2 95 %.   ECOG: 1   General appearance: Comfortable appearing without any discomfort Head: Normocephalic without any trauma Oropharynx: Mucous membranes are moist and pink without any thrush or ulcers. Eyes: Pupils are equal and round reactive to light. Lymph nodes: No cervical, supraclavicular, inguinal or axillary lymphadenopathy.   Heart:regular rate and rhythm.  S1 and S2 without leg edema. Lung:  Rhonchi noted throughout the lung field.. Abdomin: Soft, nontender, nondistended with good bowel sounds.  No hepatosplenomegaly. Musculoskeletal: No joint deformity or effusion.  Full range of motion noted. Neurological: No deficits noted on motor, sensory and deep tendon reflex exam. Skin: No petechial rash or dryness.  Appeared moist.  .   Lab Results: Lab Results  Component Value Date   WBC 43.9 (H) 05/17/2018   HGB 10.8 (L) 05/17/2018   HCT 33.1 (L) 05/17/2018   MCV 99.7 05/17/2018   PLT 418 (H) 05/17/2018     Chemistry      Component Value Date/Time   NA 140 01/26/2018 1244   NA 139 05/11/2017 0831   K 4.7 01/26/2018 1244   K 4.6 05/11/2017 0831   CL 103 01/26/2018 1244   CO2 23 01/26/2018 1244   CO2 23 05/11/2017 0831   BUN 21 01/26/2018 1244   BUN 21.1 05/11/2017 0831   CREATININE 0.91 01/26/2018 1244   CREATININE 1.0 05/11/2017 0831      Component Value Date/Time   CALCIUM 9.2 01/26/2018 1244   CALCIUM 9.0 05/11/2017 0831   ALKPHOS 65 12/04/2017 0558   ALKPHOS 65 05/11/2017 0831   AST 37 12/04/2017 0558   AST 26 05/11/2017 0831   ALT 24 12/04/2017 0558   ALT 19 05/11/2017 0831   BILITOT 0.6 12/04/2017  0558   BILITOT 0.75 05/11/2017 0831      Impression and Plan:  84 year old gentleman with:  1.  CLL diagnosed in 2015 and has been on active surveillance since.  His white cell count at baseline is close to 30,000 with lymphocytosis and no indication for treatment.  The natural course of this disease and indication for treatment was reiterated today.  These indications include rapid rise in his white cell count, symptomatic lymphadenopathy, cytopenias, recurrent infections from immune dysregulation among others.  His CBC was reviewed today to the hemoglobin is slightly elevated from his baseline but no other abnormalities noted.  I recommended continue active surveillance at this time.  Treatment options  were needed would include systemic chemotherapy, immunotherapy as well as oral targeted therapy.   2.  Prostate cancer: Continues to receive Lupron under the care of Dr. Diona Fanti without any symptomatic disease.  3.  Respiratory complaints: I doubt this is related to CLL progression.  He is following with pulmonary medicine regarding this issue.  4. Follow-up: Will be in 6 months.   15  minutes was spent with the patient face-to-face today.  More than 50% of time was dedicated to reviewing his disease status, laboratory data and discussing indication for treatment and treatment options.    Zola Button, MD 11/26/20198:17 AM

## 2018-05-17 NOTE — Telephone Encounter (Signed)
Pt called clinic to report he started a Zpak yesterday. He was previously prescribed Augmentin but stopped this due to diarrhea. Advised pt to take 1/2 tablet of warfarin today, then resume normal dose and keep follow up appt in Coumadin clinic on 12/3.

## 2018-05-18 ENCOUNTER — Encounter: Payer: Self-pay | Admitting: Nurse Practitioner

## 2018-05-18 NOTE — Assessment & Plan Note (Signed)
Patient Instructions  Will order azithromycin Please follow up with coumadin clinic  Augmentin added to allergy list - diarrhea Take mucinex daily Continue current medications Follow aspiration precautions Follow up with Dr. Ander Slade as scheduled  Cough: Continue gastroesophageal reflux disease treatment with elevating the head your bed and taking antacids Continue taking over-the-counter antihistamines and nasal fluticasone to help with allergic rhinitis You need to try to suppress your cough to allow your larynx (voice box) to heal.  For three days don't talk, laugh, sing, or clear your throat. Do everything you can to suppress the cough during this time. Use hard candies (sugarless Jolly Ranchers) or non-mint or non-menthol containing cough drops during this time to soothe your throat.  Use a cough suppressant (Delsym or what I have prescribed you) around the clock during this time.  After three days, gradually increase the use of your voice and back off on the cough suppressants.

## 2018-05-20 ENCOUNTER — Inpatient Hospital Stay (HOSPITAL_BASED_OUTPATIENT_CLINIC_OR_DEPARTMENT_OTHER)
Admission: EM | Admit: 2018-05-20 | Discharge: 2018-05-24 | DRG: 189 | Disposition: A | Discharge status: Home / Self Care | Payer: Medicare Other | Attending: Internal Medicine | Admitting: Internal Medicine

## 2018-05-20 ENCOUNTER — Emergency Department (HOSPITAL_BASED_OUTPATIENT_CLINIC_OR_DEPARTMENT_OTHER): Payer: Medicare Other

## 2018-05-20 ENCOUNTER — Encounter (HOSPITAL_BASED_OUTPATIENT_CLINIC_OR_DEPARTMENT_OTHER): Payer: Self-pay | Admitting: *Deleted

## 2018-05-20 ENCOUNTER — Other Ambulatory Visit: Payer: Self-pay

## 2018-05-20 DIAGNOSIS — J9601 Acute respiratory failure with hypoxia: Principal | ICD-10-CM | POA: Diagnosis present

## 2018-05-20 DIAGNOSIS — Z87891 Personal history of nicotine dependence: Secondary | ICD-10-CM | POA: Diagnosis not present

## 2018-05-20 DIAGNOSIS — I480 Paroxysmal atrial fibrillation: Secondary | ICD-10-CM | POA: Diagnosis present

## 2018-05-20 DIAGNOSIS — Z96643 Presence of artificial hip joint, bilateral: Secondary | ICD-10-CM | POA: Diagnosis present

## 2018-05-20 DIAGNOSIS — I10 Essential (primary) hypertension: Secondary | ICD-10-CM | POA: Diagnosis present

## 2018-05-20 DIAGNOSIS — Z79899 Other long term (current) drug therapy: Secondary | ICD-10-CM

## 2018-05-20 DIAGNOSIS — M25511 Pain in right shoulder: Secondary | ICD-10-CM | POA: Diagnosis not present

## 2018-05-20 DIAGNOSIS — C9110 Chronic lymphocytic leukemia of B-cell type not having achieved remission: Secondary | ICD-10-CM | POA: Diagnosis present

## 2018-05-20 DIAGNOSIS — I4821 Permanent atrial fibrillation: Secondary | ICD-10-CM | POA: Diagnosis present

## 2018-05-20 DIAGNOSIS — Z7984 Long term (current) use of oral hypoglycemic drugs: Secondary | ICD-10-CM

## 2018-05-20 DIAGNOSIS — X500XXA Overexertion from strenuous movement or load, initial encounter: Secondary | ICD-10-CM | POA: Diagnosis not present

## 2018-05-20 DIAGNOSIS — I495 Sick sinus syndrome: Secondary | ICD-10-CM | POA: Diagnosis present

## 2018-05-20 DIAGNOSIS — R791 Abnormal coagulation profile: Secondary | ICD-10-CM

## 2018-05-20 DIAGNOSIS — I110 Hypertensive heart disease with heart failure: Secondary | ICD-10-CM | POA: Diagnosis not present

## 2018-05-20 DIAGNOSIS — H9191 Unspecified hearing loss, right ear: Secondary | ICD-10-CM | POA: Diagnosis present

## 2018-05-20 DIAGNOSIS — S46911A Strain of unspecified muscle, fascia and tendon at shoulder and upper arm level, right arm, initial encounter: Secondary | ICD-10-CM | POA: Diagnosis not present

## 2018-05-20 DIAGNOSIS — K219 Gastro-esophageal reflux disease without esophagitis: Secondary | ICD-10-CM | POA: Diagnosis present

## 2018-05-20 DIAGNOSIS — J189 Pneumonia, unspecified organism: Secondary | ICD-10-CM

## 2018-05-20 DIAGNOSIS — Z885 Allergy status to narcotic agent status: Secondary | ICD-10-CM

## 2018-05-20 DIAGNOSIS — I2510 Atherosclerotic heart disease of native coronary artery without angina pectoris: Secondary | ICD-10-CM | POA: Diagnosis present

## 2018-05-20 DIAGNOSIS — I255 Ischemic cardiomyopathy: Secondary | ICD-10-CM | POA: Diagnosis not present

## 2018-05-20 DIAGNOSIS — I252 Old myocardial infarction: Secondary | ICD-10-CM | POA: Diagnosis not present

## 2018-05-20 DIAGNOSIS — M19011 Primary osteoarthritis, right shoulder: Secondary | ICD-10-CM | POA: Diagnosis present

## 2018-05-20 DIAGNOSIS — J9600 Acute respiratory failure, unspecified whether with hypoxia or hypercapnia: Secondary | ICD-10-CM | POA: Diagnosis present

## 2018-05-20 DIAGNOSIS — Z974 Presence of external hearing-aid: Secondary | ICD-10-CM

## 2018-05-20 DIAGNOSIS — J441 Chronic obstructive pulmonary disease with (acute) exacerbation: Secondary | ICD-10-CM | POA: Diagnosis present

## 2018-05-20 DIAGNOSIS — E785 Hyperlipidemia, unspecified: Secondary | ICD-10-CM | POA: Diagnosis present

## 2018-05-20 DIAGNOSIS — Z8546 Personal history of malignant neoplasm of prostate: Secondary | ICD-10-CM | POA: Diagnosis not present

## 2018-05-20 DIAGNOSIS — R1310 Dysphagia, unspecified: Secondary | ICD-10-CM | POA: Diagnosis not present

## 2018-05-20 DIAGNOSIS — Z881 Allergy status to other antibiotic agents status: Secondary | ICD-10-CM | POA: Diagnosis not present

## 2018-05-20 DIAGNOSIS — D649 Anemia, unspecified: Secondary | ICD-10-CM | POA: Diagnosis present

## 2018-05-20 DIAGNOSIS — I5022 Chronic systolic (congestive) heart failure: Secondary | ICD-10-CM | POA: Diagnosis present

## 2018-05-20 DIAGNOSIS — J45901 Unspecified asthma with (acute) exacerbation: Secondary | ICD-10-CM | POA: Diagnosis present

## 2018-05-20 DIAGNOSIS — Z888 Allergy status to other drugs, medicaments and biological substances status: Secondary | ICD-10-CM

## 2018-05-20 DIAGNOSIS — R918 Other nonspecific abnormal finding of lung field: Secondary | ICD-10-CM | POA: Diagnosis not present

## 2018-05-20 DIAGNOSIS — E119 Type 2 diabetes mellitus without complications: Secondary | ICD-10-CM | POA: Diagnosis not present

## 2018-05-20 DIAGNOSIS — Z7901 Long term (current) use of anticoagulants: Secondary | ICD-10-CM

## 2018-05-20 DIAGNOSIS — J181 Lobar pneumonia, unspecified organism: Secondary | ICD-10-CM

## 2018-05-20 DIAGNOSIS — Z950 Presence of cardiac pacemaker: Secondary | ICD-10-CM

## 2018-05-20 DIAGNOSIS — Z9079 Acquired absence of other genital organ(s): Secondary | ICD-10-CM

## 2018-05-20 DIAGNOSIS — J168 Pneumonia due to other specified infectious organisms: Secondary | ICD-10-CM | POA: Diagnosis not present

## 2018-05-20 DIAGNOSIS — J690 Pneumonitis due to inhalation of food and vomit: Secondary | ICD-10-CM | POA: Diagnosis not present

## 2018-05-20 LAB — BASIC METABOLIC PANEL
Anion gap: 9 (ref 5–15)
BUN: 17 mg/dL (ref 8–23)
CO2: 23 mmol/L (ref 22–32)
Calcium: 8.8 mg/dL — ABNORMAL LOW (ref 8.9–10.3)
Chloride: 102 mmol/L (ref 98–111)
Creatinine, Ser: 0.96 mg/dL (ref 0.61–1.24)
GFR calc non Af Amer: >60 mL/min (ref >60)
Glucose, Bld: 249 mg/dL — ABNORMAL HIGH (ref 70–99)
Potassium: 4.3 mmol/L (ref 3.5–5.1)
Sodium: 134 mmol/L — ABNORMAL LOW (ref 135–145)

## 2018-05-20 LAB — CBC WITH DIFFERENTIAL/PLATELET
Abs Immature Granulocytes: 0.71 10*3/uL — ABNORMAL HIGH (ref 0.00–0.07)
Basophils Absolute: 0.1 10*3/uL (ref 0.0–0.1)
Basophils Relative: 0 %
Eosinophils Absolute: 0.1 10*3/uL (ref 0.0–0.5)
Eosinophils Relative: 0 %
HCT: 34.3 % — ABNORMAL LOW (ref 39.0–52.0)
Hemoglobin: 10.8 g/dL — ABNORMAL LOW (ref 13.0–17.0)
IMMATURE GRANULOCYTES: 2 %
Lymphocytes Relative: 50 %
Lymphs Abs: 21.3 10*3/uL — ABNORMAL HIGH (ref 0.7–4.0)
MCH: 32.2 pg (ref 26.0–34.0)
MCHC: 31.5 g/dL (ref 30.0–36.0)
MCV: 102.4 fL — ABNORMAL HIGH (ref 80.0–100.0)
Monocytes Absolute: 3.9 10*3/uL — ABNORMAL HIGH (ref 0.1–1.0)
Monocytes Relative: 9 %
NEUTROS ABS: 17 10*3/uL — AB (ref 1.7–7.7)
Neutrophils Relative %: 39 %
Platelets: 523 10*3/uL — ABNORMAL HIGH (ref 150–400)
RBC: 3.35 MIL/uL — AB (ref 4.22–5.81)
RDW: 19.9 % — ABNORMAL HIGH (ref 11.5–15.5)
WBC Morphology: ABNORMAL
WBC: 43.1 10*3/uL — ABNORMAL HIGH (ref 4.0–10.5)
nRBC: 0 % (ref 0.0–0.2)

## 2018-05-20 LAB — BRAIN NATRIURETIC PEPTIDE: B Natriuretic Peptide: 407 pg/mL — ABNORMAL HIGH (ref 0.0–100.0)

## 2018-05-20 LAB — TROPONIN I: Troponin I: <0.03 ng/mL (ref <0.03)

## 2018-05-20 LAB — I-STAT CG4 LACTIC ACID, ED: Lactic Acid, Venous: 1.84 mmol/L (ref 0.5–1.9)

## 2018-05-20 LAB — PROTIME-INR
INR: 4.35
Prothrombin Time: 40.9 seconds — ABNORMAL HIGH (ref 11.4–15.2)

## 2018-05-20 MED ORDER — ALBUTEROL SULFATE (2.5 MG/3ML) 0.083% IN NEBU
2.5000 mg | INHALATION_SOLUTION | Freq: Once | RESPIRATORY_TRACT | Status: AC
  Administered 2018-05-20: 2.5 mg via RESPIRATORY_TRACT
  Filled 2018-05-20: qty 3

## 2018-05-20 MED ORDER — LEVOFLOXACIN IN D5W 750 MG/150ML IV SOLN
750.0000 mg | Freq: Once | INTRAVENOUS | Status: AC
  Administered 2018-05-20: 750 mg via INTRAVENOUS
  Filled 2018-05-20: qty 150

## 2018-05-20 MED ORDER — ONDANSETRON HCL 4 MG/2ML IJ SOLN
4.0000 mg | Freq: Once | INTRAMUSCULAR | Status: AC
Start: 2018-05-20 — End: 2018-05-20
  Administered 2018-05-20: 4 mg via INTRAVENOUS
  Filled 2018-05-20: qty 2

## 2018-05-20 MED ORDER — IPRATROPIUM-ALBUTEROL 0.5-2.5 (3) MG/3ML IN SOLN
3.0000 mL | Freq: Once | RESPIRATORY_TRACT | Status: AC
  Administered 2018-05-20: 3 mL via RESPIRATORY_TRACT
  Filled 2018-05-20: qty 3

## 2018-05-20 MED ORDER — ALBUTEROL SULFATE (2.5 MG/3ML) 0.083% IN NEBU: 2.5000 mg | INHALATION_SOLUTION | RESPIRATORY_TRACT | Status: DC | PRN

## 2018-05-20 MED ORDER — MORPHINE SULFATE (PF) 4 MG/ML IV SOLN
4.0000 mg | Freq: Once | INTRAVENOUS | Status: AC
  Administered 2018-05-20: 4 mg via INTRAVENOUS
  Filled 2018-05-20: qty 1

## 2018-05-20 NOTE — ED Notes (Signed)
Report given to Kirke Shaggy, EMT-P with [facility]. ETA 15 minutes

## 2018-05-20 NOTE — ED Triage Notes (Addendum)
Right shoulder injury 2 days ago. He was lifting a [place] and twisted. He is also SOB today worse than his normal SOB. Cough.

## 2018-05-20 NOTE — ED Provider Notes (Signed)
[place] EMERGENCY DEPARTMENT Provider Note   CSN: [id number] Arrival date & time: 05/20/18  1842     History   Chief Complaint Chief Complaint  Patient presents with  . Shoulder Injury  . Shortness of Breath    HPI Oscar Baker is a 84 y.o. male.  Pt presents to the ED today with right shoulder pain.  The pt said he was lifting a heavy [place] yesterday and twisted.  He has been unable to lift up his right shoulder since then.  He is also more sob than usual.  He denies f/c.  Pt has a hx of afib and is on coumadin.   He said he's seen the pulmonologist for his SOB.  He was on Amox last week which caused severe diarrhea.  He was on zithromax this week.  He feels worse.  He does not wear oxygen at home.  CHA2DS2/VAS Stroke Risk Points  Current as of 12 minutes ago     5 >= 2 Points: High Risk  1 - 1.99 Points: Medium Risk  0 Points: Low Risk    The patient's score has not changed in the past year.:  No Change     Details    This score determines the patient's risk of having a stroke if the  patient has atrial fibrillation.       Points Metrics  1 Has Congestive Heart Failure:  Yes    Current as of 12 minutes ago  0 Has Vascular Disease:  No    Current as of 12 minutes ago  1 Has Hypertension:  Yes    Current as of 12 minutes ago  2 Age:  46    Current as of 12 minutes ago  1 Has Diabetes:  Yes    Current as of 12 minutes ago  0 Had Stroke:  No  Had TIA:  No  Had thromboembolism:  No    Current as of 12 minutes ago  0 Male:  No    Current as of 12 minutes ago              Past Medical History:  Diagnosis Date  . Asthma 1950's   history of  . Atrial fibrillation ([place])   . Bilateral carpal tunnel syndrome   . Bilateral lower extremity edema   . Bladder tumor   . Chronic systolic heart failure (HCC)    Echo 1/19: Mild LVH, EF 45-50, inf HK, MAC, severe LAE, severe RAE // Echo 7/15: Mild LVH, mod focal basal sept hypertrophy, EF 55-60, AV  peak and mean 16/9, trivial MR, mod LAE, PASP 38  . CLL (chronic lymphocytic leukemia) [facility]) oncologist-  dr Ilene Qua--   dx [phone] ;  Lymphocytosis, CLL - per lov note 05-11-2017 currently under active survillance,  CT 04-17-2014 show very small lymphadenopathy, no indication for treatment  . Coronary artery disease    cardiologist-  dr Cathie Olden--  08-18-2017 Intermittant risk nuclear study w/ large area of inferior infartion with no evidence ishcemia   . 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 Diabetes mellitus type 2, noninsulin dependent ([place])   . Elevated PSA    since prostatectomy but now resolved  . Hematuria 04/2017  . History of ear infection    Right  . History of MI (myocardial infarction)    per myoview nuclear study 08-18-2017 , unknown when  . History of shingles 08/2017   L ear and scalp, possible  . Hyperlipidemia   .  Hypertension   . Ischemic cardiomyopathy 09/01/2017   Presumed +CAD with Nuclear stress test 08/18/17 - Inferior scar, no ischemia, intermediate risk // med management unless +angina or worse dyspnea  . OA (osteoarthritis)   . Pacemaker 02/08/2014   followed by dr g. taylor--  single chamber Biotronik due to SSS  . Permanent atrial fibrillation   . Pneumonia 2019   Left lung  . Prostate cancer [facility]) urologist-  dr Diona Fanti   dx 2004--  Gleason 8, PSA 10.45--  11-28-2002  s/p  radical prostatectomy;  recurrent w/ increasing PSA, started ADT treatment  . RBBB (right bundle branch block)   . Sick sinus syndrome ([place])    a-Flutter with episodes of bradycardia; S/P Biotronik (serial number [id number]) 02-08-2014  . Urinary incontinence   . Wears hearing aid in right ear    receiver and transmitter    Patient Active Problem List   Diagnosis Date Noted  . Acute respiratory failure with hypoxia ([place]) 05/20/2018  . Encounter for therapeutic drug monitoring 12/20/2017  . S/P thoracotomy 11/29/2017  . Sepsis ([place])   . Hypoxemia   . Pneumonia of left lower lobe due to  infectious organism ([place])   . Mediastinal lymphadenopathy   . Chronic cough 11/23/2017  . CLL (chronic lymphocytic leukemia) ([place]) 11/23/2017  . Pleural effusion 11/23/2017  . HCAP (healthcare-associated pneumonia) 11/20/2017  . Anemia 11/20/2017  . Ischemic cardiomyopathy 09/01/2017  . Right hip pain   . Acute otitis media   . Chronic systolic CHF (congestive heart failure) ([place])   . Pressure injury of skin 07/20/2017  . Malignant otitis externa 07/19/2017  . Fall at home, initial encounter 07/19/2017  . S/P left THA, AA 05/12/2016  . LPRD (laryngopharyngeal reflux disease) 05/21/2015  . Other allergic rhinitis 05/21/2015  . Obesity 12/12/2014  . Upper airway cough syndrome 12/11/2014  . Cough variant asthma 07/27/2014  . Wheezing 06/20/2014  . Symptomatic bradycardia - s/p Biotronik (serial number [id number]) 02/08/2014  . Diabetes mellitus type 2, noninsulin dependent ([place])   . Edema of foot 03/06/2013  . Traumatic ecchymosis of right foot 03/06/2013  . Bone spur 12/30/2012  . Pain of toe of left foot 12/30/2012  . Hyperlipidemia 07/13/2012  . HTN (hypertension) 04/12/2012  . Sick sinus syndrome ([place]) 08/17/2011  . PAF (paroxysmal atrial fibrillation) ([place]) 09/16/2010    Past Surgical History:  Procedure Laterality Date  . APPENDECTOMY    . BACK SURGERY     disk  . CARDIAC CATHETERIZATION  09-03-1999  dr Cathie Olden   abnormal cardiolite study:  minor luminal irregularities but no critial coronary artery stenosis  . CARDIOVASCULAR STRESS TEST  08-18-2017  dr Cathie Olden   Intermediate risk nuclear study w/ large area inferior infarction, no evidence of ishcemia (consistant w/ prior MI)/  study not gated due to frequent PVCs  . CARPAL TUNNEL RELEASE Right 2000  . CARPAL TUNNEL RELEASE Left 11/19/2009  . CATARACT EXTRACTION Right 07/2015  . CATARACT EXTRACTION Left 09/2015  . CYSTOSCOPY N/A 11/04/2017   Procedure: CYSTOSCOPY AND CAUTERIZATION OF BLADDER;  Surgeon: Franchot Gallo, MD;  Location: [facility];  Service: Urology;  Laterality: N/A;  . INSERTION PENILE PROSTHESIS  02-22-2004    dr Mattie Marlin  [facility]  . IR THORACENTESIS ASP PLEURAL SPACE W/IMG GUIDE  11/26/2017  . KNEE ARTHROSCOPY Left 07/2010  . PERMANENT PACEMAKER INSERTION N/A 02/08/2014   Procedure: PERMANENT PACEMAKER INSERTION;  Surgeon: Evans Lance, MD;  Location: [facility] CATH LAB;  Service:  Cardiovascular;  Laterality: N/A;  . PLEURAL EFFUSION DRAINAGE Left 11/30/2017   Procedure: DRAINAGE OF HEMOTHORAX;  Surgeon: Ivin Poot, MD;  Location: [facility];  Service: Thoracic;  Laterality: Left;  . RADICAL RETROPUBIC PROSTATECTOMY W/ BILATERAL PELVIC LYMPH NODE DISSECTION  11-28-2002   dr Mattie Marlin  [facility]  . TONSILLECTOMY    . TOTAL HIP ARTHROPLASTY Left 05/12/2016   Procedure: LEFT TOTAL HIP ARTHROPLASTY ANTERIOR APPROACH;  Surgeon: Paralee Cancel, MD;  Location: [facility];  Service: Orthopedics;  Laterality: Left;  . TOTAL HIP ARTHROPLASTY Right 07-15-2006   dr Alvan Dame  [facility]  . TRANSTHORACIC ECHOCARDIOGRAM  07/20/2017   mild LVH, ef 45-50%, hypokinesis of the basal-midinferior myocardium, due to AFib unable to evaluate diastolic function/  severe LAE and RAE/  trivial PR and TR  . VIDEO ASSISTED THORACOSCOPY Left 11/30/2017   Procedure: VIDEO ASSISTED THORACOSCOPY;  Surgeon: Ivin Poot, MD;  Location: [facility];  Service: Thoracic;  Laterality: Left;  [name] VIDEO ASSISTED THORACOSCOPY (VATS)/EMPYEMA Left 11/29/2017   Procedure: VIDEO ASSISTED THORACOSCOPY (VATS)/EMPYEMA;  Surgeon: Ivin Poot, MD;  Location: [facility];  Service: Thoracic;  Laterality: Left;        Home Medications    Prior to Admission medications   Medication Sig Start Date End Date Taking? Authorizing Provider  acetaminophen (TYLENOL) 500 MG tablet Take 2 tablets (1,000 mg total) by mouth every 6 (six) hours. 12/05/17   Elgie Collard, PA-C  atorvastatin (LIPITOR) 20 MG tablet TAKE ONE TABLET EACH DAY AT [facility] 03/07/18   Nahser,  Wonda Cheng, MD  azithromycin (ZITHROMAX) 250 MG tablet Take 2 tablets (500 mg) on day 1, then take 1 tablet (250 mg) on days 2-5 05/16/18   Fenton Foy, NP  carvedilol (COREG) 3.125 MG tablet Take 1 tablet (3.125 mg total) by mouth 2 (two) times daily. 08/20/17   Richardson Dopp T, PA-C  fenofibrate micronized (LOFIBRA) 200 MG capsule TAKE 1 CAPSULE BY MOUTH DAILY BEFORE BREAKFAST 06/29/17   Nahser, Wonda Cheng, MD  furosemide (LASIX) 20 MG tablet Take 1 tablet (20 mg total) by mouth daily. 01/19/18   Richardson Dopp T, PA-C  guaiFENesin (MUCINEX) 600 MG 12 hr tablet Take 600 mg by mouth as needed.     [provider]  loratadine (CLARITIN) 10 MG tablet Take 1 tablet (10 mg total) by mouth daily. 05/09/18   Fenton Foy, NP  metFORMIN (GLUCOPHAGE) 1000 MG tablet Take 1,000 mg by mouth 2 (two) times daily with a meal.      [provider]  multivitamin [facility]) per tablet Take 1 tablet by mouth daily. Will stop prior to procedure    [provider]  potassium chloride (K-DUR) 10 MEQ tablet Take 1 tablet (10 mEq total) by mouth daily. 01/19/18   Richardson Dopp T, PA-C  sitaGLIPtin (JANUVIA) 100 MG tablet Take 100 mg by mouth daily.    [provider]  vitamin B-12 (CYANOCOBALAMIN) 1000 MCG tablet Take 1,000 mcg by mouth daily. Will stop prior to procedure    [provider]  warfarin (COUMADIN) 5 MG tablet TAKE AS DIRECTED BY COUMADIN CLINIC 02/01/18   Nahser, Wonda Cheng, MD    Family History Family History  Problem Relation Age of Onset  . Emphysema Mother   . Allergic rhinitis Mother   . Heart attack Father   . Allergic rhinitis Brother   . Pulmonary fibrosis Brother     Social History Social History   Tobacco Use  .  Smoking status: Former Smoker    Years: 25.00    Types: Pipe, Cigars    Last attempt to quit: 11/25/1975    Years since quitting: 42.5  . Smokeless tobacco: Never Used  Substance Use Topics  . Alcohol use: Yes    Alcohol/week: 0.0  standard drinks    Comment: occasional  . Drug use: No     Allergies   Altace [ramipril]; Augmentin [amoxicillin-pot clavulanate]; Codeine; and Simvastatin   Review of Systems Review of Systems  Respiratory: Positive for shortness of breath.   Musculoskeletal:       Right shoulder pain  All other systems reviewed and are negative.    Physical Exam Updated Vital Signs BP (!) 146/58   Pulse (!) 104 Comment: While walking.  Temp 98.3 F (36.8 C) (Oral)   Resp (!) 23   Ht 5' 10.5" (1.791 m)   Wt 95.7 kg   SpO2 (!) 76% Comment: While walking.  BMI 29.84 kg/m   Physical Exam  Constitutional: He is oriented to person, place, and time. He appears well-developed and well-nourished.  HENT:  Head: Normocephalic and atraumatic.  Mouth/Throat: Oropharynx is clear and moist.  Eyes: Pupils are equal, round, and reactive to light. EOM are normal.  Neck: Normal range of motion. Neck supple.  Cardiovascular: Normal rate and regular rhythm.  Pulmonary/Chest: Tachypnea noted. He has rhonchi.  Abdominal: Soft. Bowel sounds are normal.  Musculoskeletal:       Right shoulder: He exhibits decreased range of motion and tenderness.       Right lower leg: Normal.       Left lower leg: Normal.  Neurological: He is alert and oriented to person, place, and time.  Skin: Skin is warm and dry. Capillary refill takes less than 2 seconds.  Psychiatric: He has a normal mood and affect. His behavior is normal.  Nursing note and vitals reviewed.    ED Treatments / Results  Labs (all labs ordered are listed, but only abnormal results are displayed) Labs Reviewed  BASIC METABOLIC PANEL - Abnormal; Notable for the following components:      Result Value   Sodium 134 (*)    Glucose, Bld 249 (*)    Calcium 8.8 (*)    All other components within normal limits  CBC WITH DIFFERENTIAL/PLATELET - Abnormal; Notable for the following components:   WBC 43.1 (*)    RBC 3.35 (*)    Hemoglobin 10.8 (*)      HCT 34.3 (*)    MCV 102.4 (*)    RDW 19.9 (*)    Platelets 523 (*)    Neutro Abs 17.0 (*)    Lymphs Abs 21.3 (*)    Monocytes Absolute 3.9 (*)    Abs Immature Granulocytes 0.71 (*)    All other components within normal limits  BRAIN NATRIURETIC PEPTIDE - Abnormal; Notable for the following components:   B Natriuretic Peptide 407.0 (*)    All other components within normal limits  PROTIME-INR - Abnormal; Notable for the following components:   Prothrombin Time 40.9 (*)    INR 4.35 (*)    All other components within normal limits  CULTURE, BLOOD (ROUTINE X 2)  CULTURE, BLOOD (ROUTINE X 2)  TROPONIN I  I-STAT CG4 LACTIC ACID, ED    EKG EKG Interpretation  Date/Time:  Friday May 20 2018 19:16:15 EST Ventricular Rate:  77 PR Interval:    QRS Duration: 169 QT Interval:  430 QTC Calculation: 535 R Axis:   -  88 Text Interpretation:  Afib/flut and V-paced complexes No further analysis attempted due to paced rhythm Baseline wander in lead(s) V3 No significant change since last tracing Confirmed by Isla Pence [phone]) on 05/20/2018 7:27:36 PM   Radiology Dg Chest 2 View  Result Date: 05/20/2018 CLINICAL DATA:  Chest pain EXAM: CHEST - 2 VIEW COMPARISON:  05/09/2018, 02/23/2018 FINDINGS: Left-sided pacing device as before. Right lower lobe airspace disease suspicious for a pneumonia. No pleural effusion. Stable cardiomediastinal silhouette with borderline cardiomegaly. Aortic atherosclerosis. No pneumothorax. IMPRESSION: Right lower lobe focal airspace opacities suspicious for pneumonia. Radiographic follow-up in 4-6 weeks following antibiotic trial recommended to ensure clearing. Electronically Signed   By: Donavan Foil M.D.   On: 05/20/2018 19:50   Dg Shoulder Right  Result Date: 05/20/2018 CLINICAL DATA:  Pain in the shoulder EXAM: RIGHT SHOULDER - 2+ VIEW COMPARISON:  None. FINDINGS: Mild AC joint degenerative change. Mild glenohumeral degenerative changes. No acute  fracture or dislocation. The right lung apex is clear IMPRESSION: No acute osseous abnormality Electronically Signed   By: Donavan Foil M.D.   On: 05/20/2018 19:49    Procedures Procedures (including critical care time)  Medications Ordered in ED Medications  levofloxacin (LEVAQUIN) IVPB 750 mg (750 mg Intravenous New Bag/Given 05/20/18 2059)  ipratropium-albuterol (DUONEB) 0.5-2.5 (3) MG/3ML nebulizer solution 3 mL (3 mLs Nebulization Given 05/20/18 1904)  albuterol (PROVENTIL) (2.5 MG/3ML) 0.083% nebulizer solution 2.5 mg (2.5 mg Nebulization Given 05/20/18 1904)  morphine 4 MG/ML injection 4 mg (4 mg Intravenous Given 05/20/18 1917)  ondansetron (ZOFRAN) injection 4 mg (4 mg Intravenous Given 05/20/18 1917)     Initial Impression / Assessment and Plan / ED Course  I have reviewed the triage vital signs and the nursing notes.  Pertinent labs & imaging results that were available during my care of the patient were reviewed by me and considered in my medical decision making (see chart for details).  Right shoulder pain is likely a strain vs. Rotator cuff injury.  He will be placed in a sling.   INR is likely elevated due to the abx that he's been on lately.  WBC is elevated, but this is chronic due to his CLL.  Pt ambulated and O2 sats drop to 76% and HR elevates to 104.  With rest, the oxygen goes back up to the low 90s.  Pt placed on 2L oxygen.    Pt and wife live right next to [facility], so they request to go to [facility].  Pt d/w Dr. Blaine Hamper ([facility]) who will admit.  CRITICAL CARE Performed by: Isla Pence   Total critical care time: 30 minutes  Critical care time was exclusive of separately billable procedures and treating other patients.  Critical care was necessary to treat or prevent imminent or life-threatening deterioration.  Critical care was time spent personally by me on the following activities: development of treatment plan with patient and/or surrogate as well as nursing,  discussions with consultants, evaluation of patient's response to treatment, examination of patient, obtaining history from patient or surrogate, ordering and performing treatments and interventions, ordering and review of laboratory studies, ordering and review of radiographic studies, pulse oximetry and re-evaluation of patient's condition.  Final Clinical Impressions(s) / ED Diagnoses   Final diagnoses:  Shoulder strain, right, initial encounter  Pneumonia of right lower lobe due to infectious organism [facility])  Acute respiratory failure with hypoxia ([place])  Elevated INR    ED Discharge Orders    None  Isla Pence, MD 05/20/18 2103

## 2018-05-20 NOTE — Care Management (Signed)
This is a no charge note  Transfer from [facility] per Dr. Carolynne Edouard  84 year old male with past medical history of hypertension, hyperlipidemia, diabetes mellitus, asthma, atrial fibrillation on Coumadin, sCHF with EF of 40%, CLL, CAD, myocardial infarction, SSS, pacemaker placement, prostate cancer (s/p of radical prostatectomy), right bundle blockade, who presents with shortness of breath and cough.  Patient has been having shortness of breath and cough for a few weeks.  Pulmonologist treated him with amoxicillin and then azithromycin without significant help.  Patient has oxygen desaturated to 76% on room air.  Chest x-ray showed possible left lower lobe infiltration concerning for pneumonia.  WBC 43.1 (43.9 on 05/17/2018 due to CLL), INR 4.35, electrolytes renal function okay, negative troponin, BNP 407, temperature normal, tachycardia, tachypnea.  Levaquin was started in the ED.  Patient is placed on telemetry bed for observation.   Please call manager of [facility] at [phone] when pt arrives to floor   Ivor Costa, MD  [facility] Pager [phone]  If 7PM-7AM, please contact night-coverage [email or web address] Password [facility] 05/20/2018, 8:57 PM

## 2018-05-20 NOTE — ED Notes (Signed)
Report given to Kathrynn Speed at [facility].

## 2018-05-20 NOTE — ED Notes (Signed)
Pt repositioned in bed. Extra pillow given. Pt took off condom cath from home and placed in home brief. PO beverage provided. Advised patient and family of bed status. Made aware that transport would soon be arriving for patient. Pt comfortable at this time. Call bell within reach.

## 2018-05-21 DIAGNOSIS — S46911A Strain of unspecified muscle, fascia and tendon at shoulder and upper arm level, right arm, initial encounter: Secondary | ICD-10-CM | POA: Diagnosis not present

## 2018-05-21 DIAGNOSIS — E119 Type 2 diabetes mellitus without complications: Secondary | ICD-10-CM

## 2018-05-21 DIAGNOSIS — I2583 Coronary atherosclerosis due to lipid rich plaque: Secondary | ICD-10-CM | POA: Diagnosis not present

## 2018-05-21 DIAGNOSIS — I495 Sick sinus syndrome: Secondary | ICD-10-CM | POA: Diagnosis present

## 2018-05-21 DIAGNOSIS — J4541 Moderate persistent asthma with (acute) exacerbation: Secondary | ICD-10-CM

## 2018-05-21 DIAGNOSIS — R1310 Dysphagia, unspecified: Secondary | ICD-10-CM | POA: Diagnosis present

## 2018-05-21 DIAGNOSIS — C9110 Chronic lymphocytic leukemia of B-cell type not having achieved remission: Secondary | ICD-10-CM

## 2018-05-21 DIAGNOSIS — J690 Pneumonitis due to inhalation of food and vomit: Secondary | ICD-10-CM | POA: Diagnosis present

## 2018-05-21 DIAGNOSIS — Z888 Allergy status to other drugs, medicaments and biological substances status: Secondary | ICD-10-CM | POA: Diagnosis not present

## 2018-05-21 DIAGNOSIS — J9601 Acute respiratory failure with hypoxia: Secondary | ICD-10-CM | POA: Diagnosis present

## 2018-05-21 DIAGNOSIS — K219 Gastro-esophageal reflux disease without esophagitis: Secondary | ICD-10-CM | POA: Diagnosis present

## 2018-05-21 DIAGNOSIS — I4821 Permanent atrial fibrillation: Secondary | ICD-10-CM | POA: Diagnosis present

## 2018-05-21 DIAGNOSIS — I255 Ischemic cardiomyopathy: Secondary | ICD-10-CM | POA: Diagnosis present

## 2018-05-21 DIAGNOSIS — Z8546 Personal history of malignant neoplasm of prostate: Secondary | ICD-10-CM | POA: Diagnosis not present

## 2018-05-21 DIAGNOSIS — I5022 Chronic systolic (congestive) heart failure: Secondary | ICD-10-CM

## 2018-05-21 DIAGNOSIS — I480 Paroxysmal atrial fibrillation: Secondary | ICD-10-CM | POA: Diagnosis not present

## 2018-05-21 DIAGNOSIS — Z87891 Personal history of nicotine dependence: Secondary | ICD-10-CM | POA: Diagnosis not present

## 2018-05-21 DIAGNOSIS — I10 Essential (primary) hypertension: Secondary | ICD-10-CM | POA: Diagnosis not present

## 2018-05-21 DIAGNOSIS — J441 Chronic obstructive pulmonary disease with (acute) exacerbation: Secondary | ICD-10-CM | POA: Diagnosis present

## 2018-05-21 DIAGNOSIS — H9191 Unspecified hearing loss, right ear: Secondary | ICD-10-CM | POA: Diagnosis present

## 2018-05-21 DIAGNOSIS — E785 Hyperlipidemia, unspecified: Secondary | ICD-10-CM | POA: Diagnosis present

## 2018-05-21 DIAGNOSIS — J45901 Unspecified asthma with (acute) exacerbation: Secondary | ICD-10-CM | POA: Diagnosis present

## 2018-05-21 DIAGNOSIS — I2510 Atherosclerotic heart disease of native coronary artery without angina pectoris: Secondary | ICD-10-CM | POA: Diagnosis present

## 2018-05-21 DIAGNOSIS — Z881 Allergy status to other antibiotic agents status: Secondary | ICD-10-CM | POA: Diagnosis not present

## 2018-05-21 DIAGNOSIS — R791 Abnormal coagulation profile: Secondary | ICD-10-CM | POA: Diagnosis not present

## 2018-05-21 DIAGNOSIS — M19011 Primary osteoarthritis, right shoulder: Secondary | ICD-10-CM | POA: Diagnosis present

## 2018-05-21 DIAGNOSIS — I252 Old myocardial infarction: Secondary | ICD-10-CM | POA: Diagnosis not present

## 2018-05-21 DIAGNOSIS — I110 Hypertensive heart disease with heart failure: Secondary | ICD-10-CM | POA: Diagnosis present

## 2018-05-21 DIAGNOSIS — Z950 Presence of cardiac pacemaker: Secondary | ICD-10-CM | POA: Diagnosis not present

## 2018-05-21 DIAGNOSIS — Z885 Allergy status to narcotic agent status: Secondary | ICD-10-CM | POA: Diagnosis not present

## 2018-05-21 DIAGNOSIS — X500XXA Overexertion from strenuous movement or load, initial encounter: Secondary | ICD-10-CM | POA: Diagnosis not present

## 2018-05-21 LAB — PROTIME-INR
INR: 4.81
Prothrombin Time: 44.3 seconds — ABNORMAL HIGH (ref 11.4–15.2)

## 2018-05-21 LAB — GLUCOSE, CAPILLARY
Glucose-Capillary: 179 mg/dL — ABNORMAL HIGH (ref 70–99)
Glucose-Capillary: 281 mg/dL — ABNORMAL HIGH (ref 70–99)
Glucose-Capillary: 275 mg/dL — ABNORMAL HIGH (ref 70–99)
Glucose-Capillary: 378 mg/dL — ABNORMAL HIGH (ref 70–99)
Glucose-Capillary: 375 mg/dL — ABNORMAL HIGH (ref 70–99)

## 2018-05-21 LAB — STREP PNEUMONIAE URINARY ANTIGEN: Strep Pneumo Urinary Antigen: NEGATIVE

## 2018-05-21 LAB — INFLUENZA PANEL BY PCR (TYPE A & B)
Influenza A By PCR: NEGATIVE
Influenza B By PCR: NEGATIVE

## 2018-05-21 MED ORDER — VITAMIN B-12 1000 MCG PO TABS
1000.0000 ug | ORAL_TABLET | Freq: Every day | ORAL | Status: DC
  Administered 2018-05-21 – 2018-05-24 (×4): 1000 ug via ORAL
  Filled 2018-05-21 (×4): qty 1

## 2018-05-21 MED ORDER — IPRATROPIUM BROMIDE 0.02 % IN SOLN
0.5000 mg | RESPIRATORY_TRACT | Status: DC
  Administered 2018-05-21 (×5): 0.5 mg via RESPIRATORY_TRACT
  Filled 2018-05-21 (×5): qty 2.5

## 2018-05-21 MED ORDER — HYDRALAZINE HCL 20 MG/ML IJ SOLN: 5.0000 mg | INTRAMUSCULAR | Status: DC | PRN

## 2018-05-21 MED ORDER — ZOLPIDEM TARTRATE 5 MG PO TABS
5.0000 mg | ORAL_TABLET | Freq: Every evening | ORAL | Status: DC | PRN
  Administered 2018-05-21 (×2): 5 mg via ORAL
  Filled 2018-05-21 (×2): qty 1

## 2018-05-21 MED ORDER — LEVALBUTEROL HCL 1.25 MG/0.5ML IN NEBU
1.2500 mg | INHALATION_SOLUTION | RESPIRATORY_TRACT | Status: DC
  Administered 2018-05-21 (×4): 1.25 mg via RESPIRATORY_TRACT
  Filled 2018-05-21 (×4): qty 0.5

## 2018-05-21 MED ORDER — ADULT MULTIVITAMIN W/MINERALS CH
1.0000 | ORAL_TABLET | Freq: Every day | ORAL | Status: DC
  Administered 2018-05-21 – 2018-05-24 (×4): 1 via ORAL
  Filled 2018-05-21 (×4): qty 1

## 2018-05-21 MED ORDER — FUROSEMIDE 10 MG/ML IJ SOLN
20.0000 mg | Freq: Once | INTRAMUSCULAR | Status: AC
  Administered 2018-05-21: 20 mg via INTRAVENOUS
  Filled 2018-05-21: qty 2

## 2018-05-21 MED ORDER — LORATADINE 10 MG PO TABS
10.0000 mg | ORAL_TABLET | Freq: Every day | ORAL | Status: DC
  Administered 2018-05-21 – 2018-05-24 (×4): 10 mg via ORAL
  Filled 2018-05-21 (×4): qty 1

## 2018-05-21 MED ORDER — GUAIFENESIN ER 600 MG PO TB12: 600.0000 mg | ORAL_TABLET | Freq: Two times a day (BID) | ORAL | Status: DC | PRN

## 2018-05-21 MED ORDER — LEVALBUTEROL HCL 1.25 MG/0.5ML IN NEBU
1.2500 mg | INHALATION_SOLUTION | Freq: Four times a day (QID) | RESPIRATORY_TRACT | Status: DC
  Administered 2018-05-22 – 2018-05-23 (×5): 1.25 mg via RESPIRATORY_TRACT
  Filled 2018-05-21 (×5): qty 0.5

## 2018-05-21 MED ORDER — METHYLPREDNISOLONE SODIUM SUCC 125 MG IJ SOLR
60.0000 mg | Freq: Three times a day (TID) | INTRAMUSCULAR | Status: DC
  Administered 2018-05-21 – 2018-05-23 (×8): 60 mg via INTRAVENOUS
  Filled 2018-05-21 (×8): qty 2

## 2018-05-21 MED ORDER — LEVALBUTEROL HCL 1.25 MG/0.5ML IN NEBU
1.2500 mg | INHALATION_SOLUTION | Freq: Four times a day (QID) | RESPIRATORY_TRACT | Status: DC
  Administered 2018-05-21: 1.25 mg via RESPIRATORY_TRACT

## 2018-05-21 MED ORDER — INSULIN GLARGINE 100 UNIT/ML ~~LOC~~ SOLN
5.0000 [IU] | Freq: Every day | SUBCUTANEOUS | Status: DC
  Administered 2018-05-21 – 2018-05-23 (×3): 5 [IU] via SUBCUTANEOUS
  Filled 2018-05-21 (×3): qty 0.05

## 2018-05-21 MED ORDER — INSULIN ASPART 100 UNIT/ML ~~LOC~~ SOLN
0.0000 [IU] | Freq: Three times a day (TID) | SUBCUTANEOUS | Status: DC
  Administered 2018-05-21: 9 [IU] via SUBCUTANEOUS
  Administered 2018-05-21 (×2): 5 [IU] via SUBCUTANEOUS
  Administered 2018-05-22: 7 [IU] via SUBCUTANEOUS
  Administered 2018-05-22: 9 [IU] via SUBCUTANEOUS
  Administered 2018-05-22 – 2018-05-23 (×2): 5 [IU] via SUBCUTANEOUS
  Administered 2018-05-23: 9 [IU] via SUBCUTANEOUS
  Administered 2018-05-23: 3 [IU] via SUBCUTANEOUS
  Administered 2018-05-24: 5 [IU] via SUBCUTANEOUS

## 2018-05-21 MED ORDER — IPRATROPIUM BROMIDE 0.02 % IN SOLN
0.5000 mg | Freq: Four times a day (QID) | RESPIRATORY_TRACT | Status: DC
  Administered 2018-05-22 – 2018-05-23 (×5): 0.5 mg via RESPIRATORY_TRACT
  Filled 2018-05-21 (×5): qty 2.5

## 2018-05-21 MED ORDER — ORAL CARE MOUTH RINSE
15.0000 mL | Freq: Two times a day (BID) | OROMUCOSAL | Status: DC
  Administered 2018-05-21: 15 mL via OROMUCOSAL

## 2018-05-21 MED ORDER — OXYCODONE-ACETAMINOPHEN 5-325 MG PO TABS
1.0000 | ORAL_TABLET | ORAL | Status: DC | PRN
  Administered 2018-05-21: 1 via ORAL
  Filled 2018-05-21: qty 1

## 2018-05-21 MED ORDER — FENOFIBRATE 160 MG PO TABS
160.0000 mg | ORAL_TABLET | Freq: Every day | ORAL | Status: DC
  Administered 2018-05-21 – 2018-05-24 (×4): 160 mg via ORAL
  Filled 2018-05-21 (×4): qty 1

## 2018-05-21 MED ORDER — ACETAMINOPHEN 325 MG PO TABS: 650.0000 mg | ORAL_TABLET | Freq: Four times a day (QID) | ORAL | Status: DC | PRN

## 2018-05-21 MED ORDER — LEVOFLOXACIN IN D5W 750 MG/150ML IV SOLN
750.0000 mg | INTRAVENOUS | Status: DC
  Administered 2018-05-21 – 2018-05-22 (×2): 750 mg via INTRAVENOUS
  Filled 2018-05-21 (×2): qty 150

## 2018-05-21 MED ORDER — INSULIN ASPART 100 UNIT/ML ~~LOC~~ SOLN
0.0000 [IU] | Freq: Every day | SUBCUTANEOUS | Status: DC
  Administered 2018-05-21: 5 [IU] via SUBCUTANEOUS
  Administered 2018-05-22: 4 [IU] via SUBCUTANEOUS

## 2018-05-21 MED ORDER — CARVEDILOL 3.125 MG PO TABS
3.1250 mg | ORAL_TABLET | Freq: Two times a day (BID) | ORAL | Status: DC
  Administered 2018-05-21 – 2018-05-24 (×7): 3.125 mg via ORAL
  Filled 2018-05-21 (×7): qty 1

## 2018-05-21 MED ORDER — FUROSEMIDE 20 MG PO TABS
20.0000 mg | ORAL_TABLET | Freq: Every day | ORAL | Status: DC
  Administered 2018-05-21 – 2018-05-24 (×4): 20 mg via ORAL
  Filled 2018-05-21 (×4): qty 1

## 2018-05-21 MED ORDER — ATORVASTATIN CALCIUM 20 MG PO TABS
20.0000 mg | ORAL_TABLET | Freq: Every day | ORAL | Status: DC
  Administered 2018-05-21 – 2018-05-23 (×3): 20 mg via ORAL
  Filled 2018-05-21: qty 2
  Filled 2018-05-21 (×4): qty 1
  Filled 2018-05-21 (×2): qty 2

## 2018-05-21 MED ORDER — HYDROXYZINE HCL 10 MG PO TABS
10.0000 mg | ORAL_TABLET | Freq: Three times a day (TID) | ORAL | Status: DC | PRN
  Filled 2018-05-21: qty 1

## 2018-05-21 MED ORDER — METRONIDAZOLE 500 MG PO TABS
250.0000 mg | ORAL_TABLET | Freq: Three times a day (TID) | ORAL | Status: DC
  Administered 2018-05-21 – 2018-05-24 (×11): 250 mg via ORAL
  Filled 2018-05-21 (×11): qty 1

## 2018-05-21 MED ORDER — LEVALBUTEROL HCL 1.25 MG/0.5ML IN NEBU
INHALATION_SOLUTION | RESPIRATORY_TRACT | Status: AC
  Administered 2018-05-21: 1.25 mg via RESPIRATORY_TRACT
  Filled 2018-05-21: qty 0.5

## 2018-05-21 NOTE — Evaluation (Signed)
Clinical/Bedside Swallow Evaluation Patient Details  Name: Oscar Baker MRN: [id number] Date of Birth: Jun 08, 1933  Today's Date: 05/21/2018 Time: SLP Start Time (ACUTE ONLY): 3875 SLP Stop Time (ACUTE ONLY): 0930 SLP Time Calculation (min) (ACUTE ONLY): 25 min  Past Medical History:  Past Medical History:  Diagnosis Date  . Asthma 1950's   history of  . Atrial fibrillation ([place])   . Bilateral carpal tunnel syndrome   . Bilateral lower extremity edema   . Bladder tumor   . Chronic systolic heart failure (HCC)    Echo 1/19: Mild LVH, EF 45-50, inf HK, MAC, severe LAE, severe RAE // Echo 7/15: Mild LVH, mod focal basal sept hypertrophy, EF 55-60, AV peak and mean 16/9, trivial MR, mod LAE, PASP 38  . CLL (chronic lymphocytic leukemia) [facility]) oncologist-  dr Ilene Qua--   dx [phone] ;  Lymphocytosis, CLL - per lov note 05-11-2017 currently under active survillance,  CT 04-17-2014 show very small lymphadenopathy, no indication for treatment  . Coronary artery disease    cardiologist-  dr Cathie Olden--  08-18-2017 Intermittant risk nuclear study w/ large area of inferior infartion with no evidence ishcemia   . 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 Diabetes mellitus type 2, noninsulin dependent ([place])   . Elevated PSA    since prostatectomy but now resolved  . Hematuria 04/2017  . History of ear infection    Right  . History of MI (myocardial infarction)    per myoview nuclear study 08-18-2017 , unknown when  . History of shingles 08/2017   L ear and scalp, possible  . Hyperlipidemia   . Hypertension   . Ischemic cardiomyopathy 09/01/2017   Presumed +CAD with Nuclear stress test 08/18/17 - Inferior scar, no ischemia, intermediate risk // med management unless +angina or worse dyspnea  . OA (osteoarthritis)   . Pacemaker 02/08/2014   followed by dr g. taylor--  single chamber Biotronik due to SSS  . Permanent atrial fibrillation   . Pneumonia 2019   Left lung  . Prostate cancer [facility]) urologist-  dr  Diona Fanti   dx 2004--  Gleason 8, PSA 10.45--  11-28-2002  s/p  radical prostatectomy;  recurrent w/ increasing PSA, started ADT treatment  . RBBB (right bundle branch block)   . Sick sinus syndrome ([place])    a-Flutter with episodes of bradycardia; S/P Biotronik (serial number [id number]) 02-08-2014  . Urinary incontinence   . Wears hearing aid in right ear    receiver and transmitter   Past Surgical History:  Past Surgical History:  Procedure Laterality Date  . APPENDECTOMY    . BACK SURGERY     disk  . CARDIAC CATHETERIZATION  09-03-1999  dr Cathie Olden   abnormal cardiolite study:  minor luminal irregularities but no critial coronary artery stenosis  . CARDIOVASCULAR STRESS TEST  08-18-2017  dr Cathie Olden   Intermediate risk nuclear study w/ large area inferior infarction, no evidence of ishcemia (consistant w/ prior MI)/  study not gated due to frequent PVCs  . CARPAL TUNNEL RELEASE Right 2000  . CARPAL TUNNEL RELEASE Left 11/19/2009  . CATARACT EXTRACTION Right 07/2015  . CATARACT EXTRACTION Left 09/2015  . CYSTOSCOPY N/A 11/04/2017   Procedure: CYSTOSCOPY AND CAUTERIZATION OF BLADDER;  Surgeon: Franchot Gallo, MD;  Location: [facility];  Service: Urology;  Laterality: N/A;  . INSERTION PENILE PROSTHESIS  02-22-2004    dr Mattie Marlin  [facility]  . IR THORACENTESIS ASP PLEURAL SPACE W/IMG GUIDE  11/26/2017  .  KNEE ARTHROSCOPY Left 07/2010  . PERMANENT PACEMAKER INSERTION N/A 02/08/2014   Procedure: PERMANENT PACEMAKER INSERTION;  Surgeon: Evans Lance, MD;  Location: [facility] CATH LAB;  Service: Cardiovascular;  Laterality: N/A;  . PLEURAL EFFUSION DRAINAGE Left 11/30/2017   Procedure: DRAINAGE OF HEMOTHORAX;  Surgeon: Ivin Poot, MD;  Location: [place];  Service: Thoracic;  Laterality: Left;  . RADICAL RETROPUBIC PROSTATECTOMY W/ BILATERAL PELVIC LYMPH NODE DISSECTION  11-28-2002   dr Mattie Marlin  [facility]  . TONSILLECTOMY    . TOTAL HIP ARTHROPLASTY Left 05/12/2016   Procedure: LEFT  TOTAL HIP ARTHROPLASTY ANTERIOR APPROACH;  Surgeon: Paralee Cancel, MD;  Location: [facility];  Service: Orthopedics;  Laterality: Left;  . TOTAL HIP ARTHROPLASTY Right 07-15-2006   dr Alvan Dame  [facility]  . TRANSTHORACIC ECHOCARDIOGRAM  07/20/2017   mild LVH, ef 45-50%, hypokinesis of the basal-midinferior myocardium, due to AFib unable to evaluate diastolic function/  severe LAE and RAE/  trivial PR and TR  . VIDEO ASSISTED THORACOSCOPY Left 11/30/2017   Procedure: VIDEO ASSISTED THORACOSCOPY;  Surgeon: Ivin Poot, MD;  Location: [place];  Service: Thoracic;  Laterality: Left;  [name] VIDEO ASSISTED THORACOSCOPY (VATS)/EMPYEMA Left 11/29/2017   Procedure: VIDEO ASSISTED THORACOSCOPY (VATS)/EMPYEMA;  Surgeon: Prescott Gum, Collier Salina, MD;  Location: [place];  Service: Thoracic;  Laterality: Left;   HPI:  84 y.o. male with medical history significant of former smoker, LPRD (laryngopharyngeal reflux disease), hypertension, hyperlipidemia, diabetes mellitus, asthma, atrial fibrillation on Coumadin, sCHF with EF of 40%, CLL, CAD, myocardial infarction, SSS, pacemaker placement, prostate cancer (s/p of radical prostatectomy), right bundle blockade, who presents with shortness of breath and cough CXR 05/20/18 indicated Right lower lobe focal airspace opacities suspicious for pneumonia. Radiographic follow-up in 4-6 weeks following antibiotic trial recommended to ensure clearing.  Assessment / Plan / Recommendation Clinical Impression   Delayed cough and increased dyspnea noted with initial swallows of thin via cup with review of prior MBS (June 2019)  recommending nectar-thickened liquids/Dysphagia 3 diet d/t decreased epiglottic inversion likely d/t presence of osteophytes; delayed cough noted with other consistencies as well, but differentiation of thin vs other consistencies triggering delayed coughing difficult to discern; swallowing strategies of chin tuck/small sips and initiated with pt with decreased overt s/s of aspiration  noted with implementation; discussed risk of aspiration with thin vs nectar-thickened liquids and pt adamantly refused nectar-thickened liquids during assessment; he stated "I have had a good life and I don't want the thickened liquids; "I just can't drink them."  Discussed risk of aspiration and implementation of swallowing strategies with teach-back utilized with pt and observation of these being used with thin liquids during assessment independently; pt is aware of risk of potential aspiration and assumes these risks; ST will f/u during acute stay for diet tolerance and pt/family/caregiver education re: aspiration precautions/swallowing strategies; thank you for this consult. SLP Visit Diagnosis: Dysphagia, pharyngeal phase (R13.13)    Aspiration Risk  Moderate aspiration risk    Diet Recommendation   Dysphagia 3 (mechanical/soft)/thin liquids with chin tuck/small sips  Medication Administration: Whole meds with puree    Other  Recommendations Oral Care Recommendations: Oral care BID   Follow up Recommendations Other (comment)(TBD)      Frequency and Duration min 2x/week  1 week       Prognosis Prognosis for Safe Diet Advancement: Good Barriers/Prognosis Comment: (Pt aware of aspiration risk d/t MBS results from June 2019)      Swallow Study   General Date of Onset:  05/20/18 HPI: 84 y.o. male with medical history significant of former smoker, LPRD (laryngopharyngeal reflux disease), hypertension, hyperlipidemia, diabetes mellitus, asthma, atrial fibrillation on Coumadin, sCHF with EF of 40%, CLL, CAD, myocardial infarction, SSS, pacemaker placement, prostate cancer (s/p of radical prostatectomy), right bundle blockade, who presents with shortness of breath and cough Type of Study: Bedside Swallow Evaluation Previous Swallow Assessment: (MBS; June 2019 indicating NTL/Dysphagia 3 diet) Diet Prior to this Study: NPO Temperature Spikes Noted: No Respiratory Status: Nasal  cannula History of Recent Intubation: No Behavior/Cognition: Alert;Cooperative;Pleasant mood Oral Cavity Assessment: Within Functional Limits Oral Care Completed by SLP: Recent completion by staff Oral Cavity - Dentition: Adequate natural dentition Vision: Functional for self-feeding Self-Feeding Abilities: Able to feed self Patient Positioning: Upright in bed Baseline Vocal Quality: Hoarse;Breathy Volitional Cough: Strong Volitional Swallow: Able to elicit    Oral/Motor/Sensory Function Overall Oral Motor/Sensory Function: Within functional limits   Ice Chips Ice chips: Within functional limits Presentation: Spoon   Thin Liquid Thin Liquid: Impaired Presentation: Cup Pharyngeal  Phase Impairments: Cough - Delayed;Other (comments)(increased dyspnea)    Nectar Thick Nectar Thick Liquid: (Pt refused) Other Comments: (Pt refused)   Honey Thick Honey Thick Liquid: Not tested Other Comments: (Pt refused)   Puree Puree: Impaired Presentation: Self Fed Pharyngeal Phase Impairments: Cough - Delayed   Solid     Solid: Impaired Presentation: Self Fed Pharyngeal Phase Impairments: Cough - Delayed      Elvina Sidle, M.S., CCC-SLP 05/21/2018,12:14 PM

## 2018-05-21 NOTE — Progress Notes (Signed)
Pharmacy Antibiotic Note  Oscar Baker is a 84 y.o. male admitted on 05/20/2018 with pneumonia.  Pharmacy has been consulted for levaquin dosing. He also has h/o afib and will continue coumadin  Plan: Continue levaquin 750 mg IV q24 hours F/u cultures and clinical course Daily PT/INR  Height: 5' 10.5" (179.1 cm) Weight: 210 lb 15.7 oz (95.7 kg) IBW/kg (Calculated) : 74.15  Temp (24hrs), Avg:98.3 F (36.8 C), Min:98.3 F (36.8 C), Max:98.3 F (36.8 C)  Recent Labs  Lab 05/17/18 0805 05/20/18 1915 05/20/18 2048  WBC 43.9* 43.1*  --   CREATININE 1.11 0.96  --   LATICACIDVEN  --   --  1.84    Estimated Creatinine Clearance: 68.3 mL/min (by C-G formula based on SCr of 0.96 mg/dL).    Allergies  Allergen Reactions  . Altace [Ramipril] Other (See Comments)    "throat felt like had a knot in it"  . Augmentin [Amoxicillin-Pot Clavulanate] Diarrhea    severe  . Codeine Nausea And Vomiting    Nausea and vomiting   . Simvastatin Other (See Comments)    Leg aches    Thank you for allowing pharmacy to be a part of this patient's care.  Excell Seltzer Poteet 05/21/2018 1:12 AM

## 2018-05-21 NOTE — Progress Notes (Signed)
Patient seen and examined this morning, admitted overnight, H&P reviewed and agree with the assessment and plan  In brief, this is an 84 year old male with history of tobacco use, lateral pharyngeal reflux disease, hypertension, hyperlipidemia, diabetes mellitus, asthma, A. fib on chronic Coumadin, chronic systolic CHF with an EF of 40%, CLL, coronary artery disease, sick sinus syndrome status post pacemaker, prostate cancer status post radical prostatectomy, who was admitted to the hospital with progressive worsening shortness of breath and cough, while on antibiotics at home got worse, chest x-ray on admission showed evidence of pneumonia. He was admitted to the hospital for further evaluation / treatment.  Principal problem Acute hypoxic respiratory failure -Possible aspiration pneumonia given history of leading pharyngeal reflux disease -Speech to evaluate -Component of asthma/COPD exacerbation, patient with known history of cough variant asthma and suspect COPD due to previous tobacco use  Additional problems Laryngopharyngeal reflux disease -Appreciate speech input  Atrial fibrillation -Continue Coumadin per pharmacy, Coreg for rate control  Hypertension -Continue home medications  Hyperlipidemia -Continue home medications  Diabetes mellitus -Continue sliding scale  Chronic systolic CHF -Has some pitting dependent edema but does not appear to be significantly fluid overloaded, continue home oral Lasix  CLL -Under observation by Dr. Alen Blew  Coronary artery disease -No chest pain, continue current regimen  Shoulder pain -No acute findings on the x-ray  Latonga Ponder M. Cruzita Lederer, MD, PhD [facility] Pager [phone]  If 7PM-7AM, please contact night-coverage [email or web address] Password [email or web address]

## 2018-05-21 NOTE — Progress Notes (Signed)
ANTICOAGULATION CONSULT NOTE - Initial Consult  Pharmacy Consult for Warfarin Indication: atrial fibrillation  Allergies  Allergen Reactions  . Altace [Ramipril] Other (See Comments)    "throat felt like had a knot in it"  . Augmentin [Amoxicillin-Pot Clavulanate] Diarrhea    severe  . Codeine Nausea And Vomiting    Nausea and vomiting   . Simvastatin Other (See Comments)    Leg aches    Patient Measurements: Height: 5' 10.5" (179.1 cm) Weight: 210 lb 15.7 oz (95.7 kg) IBW/kg (Calculated) : 74.15  Vital Signs: Temp: 97.3 F (36.3 C) (11/30 0732) Temp Source: Oral (11/30 0732) BP: 131/64 (11/30 0732) Pulse Rate: 76 (11/30 0732)  Labs: Recent Labs    05/20/18 1905 05/20/18 1915 05/21/18 0229  HGB  --  10.8*  --   HCT  --  34.3*  --   PLT  --  523*  --   LABPROT  --  40.9* 44.3*  INR  --  4.35* 4.81*  CREATININE  --  0.96  --   TROPONINI <0.03  --   --     Estimated Creatinine Clearance: 68.3 mL/min (by C-G formula based on SCr of 0.96 mg/dL).   Medical History: Past Medical History:  Diagnosis Date  . Asthma 1950's   history of  . Atrial fibrillation ([place])   . Bilateral carpal tunnel syndrome   . Bilateral lower extremity edema   . Bladder tumor   . Chronic systolic heart failure (HCC)    Echo 1/19: Mild LVH, EF 45-50, inf HK, MAC, severe LAE, severe RAE // Echo 7/15: Mild LVH, mod focal basal sept hypertrophy, EF 55-60, AV peak and mean 16/9, trivial MR, mod LAE, PASP 38  . CLL (chronic lymphocytic leukemia) [facility]) oncologist-  dr Ilene Qua--   dx [phone] ;  Lymphocytosis, CLL - per lov note 05-11-2017 currently under active survillance,  CT 04-17-2014 show very small lymphadenopathy, no indication for treatment  . Coronary artery disease    cardiologist-  dr Cathie Olden--  08-18-2017 Intermittant risk nuclear study w/ large area of inferior infartion with no evidence ishcemia   . 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 Diabetes mellitus type 2, noninsulin dependent ([place])   .  Elevated PSA    since prostatectomy but now resolved  . Hematuria 04/2017  . History of ear infection    Right  . History of MI (myocardial infarction)    per myoview nuclear study 08-18-2017 , unknown when  . History of shingles 08/2017   L ear and scalp, possible  . Hyperlipidemia   . Hypertension   . Ischemic cardiomyopathy 09/01/2017   Presumed +CAD with Nuclear stress test 08/18/17 - Inferior scar, no ischemia, intermediate risk // med management unless +angina or worse dyspnea  . OA (osteoarthritis)   . Pacemaker 02/08/2014   followed by dr g. taylor--  single chamber Biotronik due to SSS  . Permanent atrial fibrillation   . Pneumonia 2019   Left lung  . Prostate cancer [facility]) urologist-  dr Diona Fanti   dx 2004--  Gleason 8, PSA 10.45--  11-28-2002  s/p  radical prostatectomy;  recurrent w/ increasing PSA, started ADT treatment  . RBBB (right bundle branch block)   . Sick sinus syndrome ([place])    a-Flutter with episodes of bradycardia; S/P Biotronik (serial number [id number]) 02-08-2014  . Urinary incontinence   . Wears hearing aid in right ear    receiver and transmitter   Assessment: Pt is a 84 YO M  on warfarin PTA for Afib. INR on admission supratherapeutic at 4.35. INR remains supratherapeutic today at 4.81. No reports of bleeding, Hgb 10.8, plts ok  PTA regimen = 49m daily, except 7.522mon Saturday  Pt reports last dose taken 11/29  Goal of Therapy:  INR 2-3 Monitor platelets by anticoagulation protocol: Yes   Plan:  Hold warfarin today Monitor daily INR, s/sx bleeding, CBC  TaJuanell FairlyPharmD PGY1 Pharmacy Resident Phone [phone]/30/2019 1:21 PM

## 2018-05-21 NOTE — H&P (Signed)
History and Physical    Oscar Baker ZJI:[id number] DOB: Aug 19, 1932 DOA: 05/20/2018  Referring MD/NP/PA:   PCP: [name], MD   Patient coming from:  The patient is coming from home.  At baseline, pt is partially dependent for most of ADL.        Chief Complaint: SOB and cough  HPI: Oscar Baker is a 84 y.o. male with medical history significant of former smoker, LPRD (laryngopharyngeal reflux disease), hypertension, hyperlipidemia, diabetes mellitus, asthma, atrial fibrillation on Coumadin, sCHF with EF of 40%, CLL, CAD, myocardial infarction, SSS, pacemaker placement, prostate cancer (s/p of radical prostatectomy), right bundle blockade, who presents with shortness of breath and cough.  Patient has been having shortness of breath and cough for a few weeks.  Patient coughs up brownish colored sputum.  Denies fever, chills.  No chest pain. Patient has oxygen desaturated to 76% on room air in ED.  Pulmonologist treated him with amoxicillin and then azithromycin without improvement.  Per his wife, patient has history of laryngal pharyngeal reflux disease. He is supposed to eat food with food thicker, but he does not do it most of the time.  He probably aspirated something. Patient denies nausea, vomiting, diarrhea, abdominal pain, symptoms of UTI or unilateral weakness.  Patient has chronic poor balance and is using walker at home. Pt states that he injured his right shoulder injury 2 days ago when was lifting a heavy [place] and twisted. He has constant moderate pain over right shoulder and right clavicle area. No numbness in right arm.  ED Course: pt was found to have WBC 43.1 (43.9 on 05/17/2018 due to CLL), lactic acid 1.84, INR 4.35, electrolytes renal function okay, negative troponin, BNP 407, temperature normal, tachycardia, tachypnea.   Chest x-ray showed possible left lower lobe infiltration.  X-ray of right shoulder is negative.  Patient is placed on telemetry bed for  observation.  Review of Systems:   General: no fevers, chills, no body weight gain, has poor appetite, has fatigue HEENT: no blurry vision, hearing changes or sore throat Respiratory: has dyspnea, coughing, wheezing CV: no chest pain, no palpitations GI: no nausea, vomiting, abdominal pain, diarrhea, constipation GU: no dysuria, burning on urination, increased urinary frequency, hematuria  Ext: has leg edema Neuro: no unilateral weakness, numbness, or tingling, no vision change or hearing loss Skin: no rash, no skin tear. MSK: has pain in right shoulder and right clavicle area Heme: No easy bruising.  Travel history: No recent long distant travel.  Allergy:  Allergies  Allergen Reactions  . Altace [Ramipril] Other (See Comments)    "throat felt like had a knot in it"  . Augmentin [Amoxicillin-Pot Clavulanate] Diarrhea    severe  . Codeine Nausea And Vomiting    Nausea and vomiting   . Simvastatin Other (See Comments)    Leg aches    Past Medical History:  Diagnosis Date  . Asthma 1950's   history of  . Atrial fibrillation ([facility])   . Bilateral carpal tunnel syndrome   . Bilateral lower extremity edema   . Bladder tumor   . Chronic systolic heart failure (HCC)    Echo 1/19: Mild LVH, EF 45-50, inf HK, MAC, severe LAE, severe RAE // Echo 7/15: Mild LVH, mod focal basal sept hypertrophy, EF 55-60, AV peak and mean 16/9, trivial MR, mod LAE, PASP 38  . CLL (chronic lymphocytic leukemia) [facility]) oncologist-  dr Ilene Qua--   dx [phone] ;  Lymphocytosis, CLL - per lov note 05-11-2017  currently under active survillance,  CT 04-17-2014 show very small lymphadenopathy, no indication for treatment  . Coronary artery disease    cardiologist-  dr Cathie Olden--  08-18-2017 Intermittant risk nuclear study w/ large area of inferior infartion with no evidence ishcemia   . 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 Diabetes mellitus type 2, noninsulin dependent ([place])   . Elevated PSA    since prostatectomy but now  resolved  . Hematuria 04/2017  . History of ear infection    Right  . History of MI (myocardial infarction)    per myoview nuclear study 08-18-2017 , unknown when  . History of shingles 08/2017   L ear and scalp, possible  . Hyperlipidemia   . Hypertension   . Ischemic cardiomyopathy 09/01/2017   Presumed +CAD with Nuclear stress test 08/18/17 - Inferior scar, no ischemia, intermediate risk // med management unless +angina or worse dyspnea  . OA (osteoarthritis)   . Pacemaker 02/08/2014   followed by dr g. taylor--  single chamber Biotronik due to SSS  . Permanent atrial fibrillation   . Pneumonia 2019   Left lung  . Prostate cancer [facility]) urologist-  dr Diona Fanti   dx 2004--  Gleason 8, PSA 10.45--  11-28-2002  s/p  radical prostatectomy;  recurrent w/ increasing PSA, started ADT treatment  . RBBB (right bundle branch block)   . Sick sinus syndrome ([place])    a-Flutter with episodes of bradycardia; S/P Biotronik (serial number [id number]) 02-08-2014  . Urinary incontinence   . Wears hearing aid in right ear    receiver and transmitter    Past Surgical History:  Procedure Laterality Date  . APPENDECTOMY    . BACK SURGERY     disk  . CARDIAC CATHETERIZATION  09-03-1999  dr Cathie Olden   abnormal cardiolite study:  minor luminal irregularities but no critial coronary artery stenosis  . CARDIOVASCULAR STRESS TEST  08-18-2017  dr Cathie Olden   Intermediate risk nuclear study w/ large area inferior infarction, no evidence of ishcemia (consistant w/ prior MI)/  study not gated due to frequent PVCs  . CARPAL TUNNEL RELEASE Right 2000  . CARPAL TUNNEL RELEASE Left 11/19/2009  . CATARACT EXTRACTION Right 07/2015  . CATARACT EXTRACTION Left 09/2015  . CYSTOSCOPY N/A 11/04/2017   Procedure: CYSTOSCOPY AND CAUTERIZATION OF BLADDER;  Surgeon: Franchot Gallo, MD;  Location: [facility];  Service: Urology;  Laterality: N/A;  . INSERTION PENILE PROSTHESIS  02-22-2004    dr Mattie Marlin  [facility]   . IR THORACENTESIS ASP PLEURAL SPACE W/IMG GUIDE  11/26/2017  . KNEE ARTHROSCOPY Left 07/2010  . PERMANENT PACEMAKER INSERTION N/A 02/08/2014   Procedure: PERMANENT PACEMAKER INSERTION;  Surgeon: Evans Lance, MD;  Location: [facility] CATH LAB;  Service: Cardiovascular;  Laterality: N/A;  . PLEURAL EFFUSION DRAINAGE Left 11/30/2017   Procedure: DRAINAGE OF HEMOTHORAX;  Surgeon: Ivin Poot, MD;  Location: [place];  Service: Thoracic;  Laterality: Left;  . RADICAL RETROPUBIC PROSTATECTOMY W/ BILATERAL PELVIC LYMPH NODE DISSECTION  11-28-2002   dr Mattie Marlin  [facility]  . TONSILLECTOMY    . TOTAL HIP ARTHROPLASTY Left 05/12/2016   Procedure: LEFT TOTAL HIP ARTHROPLASTY ANTERIOR APPROACH;  Surgeon: Paralee Cancel, MD;  Location: [facility];  Service: Orthopedics;  Laterality: Left;  . TOTAL HIP ARTHROPLASTY Right 07-15-2006   dr Alvan Dame  [facility]  . TRANSTHORACIC ECHOCARDIOGRAM  07/20/2017   mild LVH, ef 45-50%, hypokinesis of the basal-midinferior myocardium, due to AFib unable to evaluate diastolic function/  severe LAE and RAE/  trivial PR and TR  . VIDEO ASSISTED THORACOSCOPY Left 11/30/2017   Procedure: VIDEO ASSISTED THORACOSCOPY;  Surgeon: Ivin Poot, MD;  Location: [place];  Service: Thoracic;  Laterality: Left;  [name] VIDEO ASSISTED THORACOSCOPY (VATS)/EMPYEMA Left 11/29/2017   Procedure: VIDEO ASSISTED THORACOSCOPY (VATS)/EMPYEMA;  Surgeon: Ivin Poot, MD;  Location: [place];  Service: Thoracic;  Laterality: Left;    Social History:  reports that he quit smoking about 42 years ago. His smoking use included pipe and cigars. He quit after 25.00 years of use. He has never used smokeless tobacco. He reports that he drinks alcohol. He reports that he does not use drugs.  Family History:  Family History  Problem Relation Age of Onset  . Emphysema Mother   . Allergic rhinitis Mother   . Heart attack Father   . Allergic rhinitis Brother   . Pulmonary fibrosis Brother      Prior to Admission medications    Medication Sig Start Date End Date Taking? Authorizing Provider  acetaminophen (TYLENOL) 500 MG tablet Take 2 tablets (1,000 mg total) by mouth every 6 (six) hours. 12/05/17   Elgie Collard, PA-C  atorvastatin (LIPITOR) 20 MG tablet TAKE ONE TABLET EACH DAY AT [facility] 03/07/18   Nahser, Wonda Cheng, MD  azithromycin (ZITHROMAX) 250 MG tablet Take 2 tablets (500 mg) on day 1, then take 1 tablet (250 mg) on days 2-5 05/16/18   Fenton Foy, NP  carvedilol (COREG) 3.125 MG tablet Take 1 tablet (3.125 mg total) by mouth 2 (two) times daily. 08/20/17   Richardson Dopp T, PA-C  fenofibrate micronized (LOFIBRA) 200 MG capsule TAKE 1 CAPSULE BY MOUTH DAILY BEFORE BREAKFAST 06/29/17   Nahser, Wonda Cheng, MD  furosemide (LASIX) 20 MG tablet Take 1 tablet (20 mg total) by mouth daily. 01/19/18   Richardson Dopp T, PA-C  guaiFENesin (MUCINEX) 600 MG 12 hr tablet Take 600 mg by mouth as needed.     [provider]  loratadine (CLARITIN) 10 MG tablet Take 1 tablet (10 mg total) by mouth daily. 05/09/18   Fenton Foy, NP  metFORMIN (GLUCOPHAGE) 1000 MG tablet Take 1,000 mg by mouth 2 (two) times daily with a meal.      [provider]  multivitamin [facility]) per tablet Take 1 tablet by mouth daily. Will stop prior to procedure    [provider]  potassium chloride (K-DUR) 10 MEQ tablet Take 1 tablet (10 mEq total) by mouth daily. 01/19/18   Richardson Dopp T, PA-C  sitaGLIPtin (JANUVIA) 100 MG tablet Take 100 mg by mouth daily.    [provider]  vitamin B-12 (CYANOCOBALAMIN) 1000 MCG tablet Take 1,000 mcg by mouth daily. Will stop prior to procedure    [provider]  warfarin (COUMADIN) 5 MG tablet TAKE AS DIRECTED BY COUMADIN CLINIC 02/01/18   Nahser, Wonda Cheng, MD    Physical Exam: Vitals:   05/20/18 2028 05/20/18 2117 05/20/18 2201 05/20/18 2326  BP:  120/61 120/61 (!) 117/58  Pulse: (!) 104 68 64 66  Resp:  (!) 30 (!) 32 20  Temp:   98.3 F (36.8 C) 98.3 F (36.8  C)  TempSrc:    Oral  SpO2: (!) 76% 92% 96% 93%  Weight:      Height:       General: Not in acute distress HEENT:       Eyes: PERRL, EOMI, no scleral icterus.  ENT: No discharge from the ears and nose, no pharynx injection, no tonsillar enlargement.        Neck: No JVD, no bruit, no mass felt. Heme: No neck lymph node enlargement. Cardiac: S1/S2, RRR, No murmurs, No gallops or rubs. Respiratory: Diffuse rhonchi and no wheezing bilaterally. GI: Soft, nondistended, nontender, no rebound pain, no organomegaly, BS present. GU: No hematuria Ext: 1+ pitting leg edema bilaterally. 2+DP/PT pulse bilaterally. Musculoskeletal: Has tenderness in the right shoulder and clavicle area.   Skin: No rashes.  Neuro: Alert, oriented X3, cranial nerves II-XII grossly intact, moves all extremities normally Psych: Patient is not psychotic, no suicidal or hemocidal ideation.  Labs on Admission: I have personally reviewed following labs and imaging studies  CBC: Recent Labs  Lab 05/17/18 0805 05/20/18 1915  WBC 43.9* 43.1*  NEUTROABS 15.2* 17.0*  HGB 10.8* 10.8*  HCT 33.1* 34.3*  MCV 99.7 102.4*  PLT 418* 716*   Basic Metabolic Panel: Recent Labs  Lab 05/17/18 0805 05/20/18 1915  NA 138 134*  K 4.6 4.3  CL 105 102  CO2 23 23  GLUCOSE 288* 249*  BUN 16 17  CREATININE 1.11 0.96  CALCIUM 9.0 8.8*   GFR: Estimated Creatinine Clearance: 68.3 mL/min (by C-G formula based on SCr of 0.96 mg/dL). Liver Function Tests: Recent Labs  Lab 05/17/18 0805  AST 18  ALT 11  ALKPHOS 71  BILITOT 0.9  PROT 6.4*  ALBUMIN 3.0*   No results for input(s): LIPASE, AMYLASE in the last 168 hours. No results for input(s): AMMONIA in the last 168 hours. Coagulation Profile: Recent Labs  Lab 05/20/18 1915  INR 4.35*   Cardiac Enzymes: Recent Labs  Lab 05/20/18 1905  TROPONINI <0.03   BNP (last 3 results) No results for input(s): PROBNP in the last 8760 hours. HbA1C: No results for  input(s): HGBA1C in the last 72 hours. CBG: No results for input(s): GLUCAP in the last 168 hours. Lipid Profile: No results for input(s): CHOL, HDL, LDLCALC, TRIG, CHOLHDL, LDLDIRECT in the last 72 hours. Thyroid Function Tests: No results for input(s): TSH, T4TOTAL, FREET4, T3FREE, THYROIDAB in the last 72 hours. Anemia Panel: No results for input(s): VITAMINB12, FOLATE, FERRITIN, TIBC, IRON, RETICCTPCT in the last 72 hours. Urine analysis:    Component Value Date/Time   COLORURINE YELLOW 11/27/2017 1454   APPEARANCEUR HAZY (A) 11/27/2017 1454   LABSPEC 1.018 11/27/2017 1454   PHURINE 7.0 11/27/2017 1454   GLUCOSEU >=500 (A) 11/27/2017 1454   HGBUR SMALL (A) 11/27/2017 1454   BILIRUBINUR NEGATIVE 11/27/2017 1454   KETONESUR NEGATIVE 11/27/2017 1454   PROTEINUR NEGATIVE 11/27/2017 1454   NITRITE NEGATIVE 11/27/2017 1454   LEUKOCYTESUR MODERATE (A) 11/27/2017 1454   Sepsis Labs: @LABRCNTIP (procalcitonin:4,lacticidven:4) ) Recent Results (from the past 240 hour(s))  Culture, blood (routine x 2)     Status: None (Preliminary result)   Collection Time: 05/20/18  8:52 PM  Result Value Ref Range Status   Specimen Description BLOOD LEFT HAND  Final   Special Requests   Final    BOTTLES DRAWN AEROBIC AND ANAEROBIC Blood Culture adequate volume Performed at [facility], [address] [place], [place] [address]    Culture PENDING  Incomplete   Report Status PENDING  Incomplete     Radiological Exams on Admission: Dg Chest 2 View  Result Date: 05/20/2018 CLINICAL DATA:  Chest pain EXAM: CHEST - 2 VIEW COMPARISON:  05/09/2018, 02/23/2018 FINDINGS: Left-sided pacing device as before. Right lower lobe  airspace disease suspicious for a pneumonia. No pleural effusion. Stable cardiomediastinal silhouette with borderline cardiomegaly. Aortic atherosclerosis. No pneumothorax. IMPRESSION: Right lower lobe focal airspace opacities suspicious for pneumonia. Radiographic follow-up  in 4-6 weeks following antibiotic trial recommended to ensure clearing. Electronically Signed   By: Donavan Foil M.D.   On: 05/20/2018 19:50   Dg Shoulder Right  Result Date: 05/20/2018 CLINICAL DATA:  Pain in the shoulder EXAM: RIGHT SHOULDER - 2+ VIEW COMPARISON:  None. FINDINGS: Mild AC joint degenerative change. Mild glenohumeral degenerative changes. No acute fracture or dislocation. The right lung apex is clear IMPRESSION: No acute osseous abnormality Electronically Signed   By: Donavan Foil M.D.   On: 05/20/2018 19:49     EKG: Independently reviewed.  Paced rhythm, QTC 535.  Assessment/Plan Principal Problem:   Acute respiratory failure with hypoxia (HCC) Active Problems:   PAF (paroxysmal atrial fibrillation) (HCC)   HTN (hypertension)   Hyperlipidemia   Diabetes mellitus type 2, noninsulin dependent (HCC)   LPRD (laryngopharyngeal reflux disease)   Chronic systolic CHF (congestive heart failure) (HCC)   Anemia   CLL (chronic lymphocytic leukemia) (HCC)   CAD (coronary artery disease)   Asthma exacerbation   Aspiration pneumonia (HCC)   Shoulder strain, right, initial encounter   Acute respiratory failure with hypoxia ([place]): Likely due to aspiration pneumonia given hx of LPRD (laryngopharyngeal reflux disease).  Patient carries diagnosis of asthma, now has diffused wheezing or rhonchi on auscultation, indicating asthma exacerbation.  Patient is a former smoker, he may have undiagnosed COPD, therefore COPD exacerbation is also possible.  Patient failed outpatient to antibiotic treatment.  - Will place on telemetry bed for obs. - Levaquin was started in the ED, will continue Levaquin - will add Flagyl. - Mucinex for cough  - Atrovent nebs and Xopenex Neb prn for SOB - Solu-Medrol 60 mg 3 times daily - Incentive spirometry - Urine legionella and S. pneumococcal antigen - Follow up blood culture x2, sputum culture and plus Flu pcr  LPRD (laryngopharyngeal reflux  disease): -keek NPO until SLP is done  Atrial Fibrillation: CHA2DS2-VASc Score is 6, needs oral anticoagulation. Patient is on Coumadin at home. INR is 4.35 on admission. Heart rate is well controlled. -coumadin per pharm -coreg  HTN:  -Continue home medications: Coreg -Patient is also on Lasix -IV hydralazine prn  Hyperlipidemia: -Lipitor and fenofibrate  Diabetes mellitus type 2, noninsulin dependent ([place]): Last A1c 6.0 on 05/05/16, well controled. Patient is taking Januvia and metformin at home -SSI  Chronic systolic CHF (congestive heart failure) ([place]): 2D echo on 07/20/2017 showed EF of 40-45%.  No pulmonary edema on chest x-ray.  BNP is slightly elevated 407.  Patient has 1+ leg edema, but no JVD.  Patient has mild fluid overload, but does not have acute CHF exacerbation. -Continue home oral Lasix 20 mg daily -Give 1 extra dose of Lasix 20 mg by IV now  Anemia: Hgb stable. Hgb 10.8 -f/u by CBC  CLL (chronic lymphocytic leukemia) ([place]): under observation by Dr. Alen Blew.  WBC 41.1, which was a 43.9 on 05/17/2018, no significant change. -Follow-up with Dr. Alen Blew  CAD (coronary artery disease): No CP -Continue Coreg, Lipitor, fenofibrate  Shoulder strain, right, initial encounter: No bony fracture on x-ray -prn percocet -shoulder sling   DVT ppx: on Coumadin Code Status: Partial code (I discussed with the patient in the presence of wife, and explained the meaning of CODE STATUS, patient wants to be partial code, OK for CPR, but no intubation).  Family Communication:   Yes, patient's  Wife and friend at bed side Disposition Plan:  Anticipate discharge back to previous home environment Consults called:  none Admission status: Obs / tele     Date of Service 05/21/2018    Ivor Costa [facility] Pager [phone]  If 7PM-7AM, please contact night-coverage [email or web address] Password [email or web address] 05/21/2018, 1:47 AM

## 2018-05-22 DIAGNOSIS — I2510 Atherosclerotic heart disease of native coronary artery without angina pectoris: Secondary | ICD-10-CM

## 2018-05-22 DIAGNOSIS — I2583 Coronary atherosclerosis due to lipid rich plaque: Secondary | ICD-10-CM

## 2018-05-22 LAB — CBC
HCT: 29.1 % — ABNORMAL LOW (ref 39.0–52.0)
Hemoglobin: 9.2 g/dL — ABNORMAL LOW (ref 13.0–17.0)
MCH: 31.3 pg (ref 26.0–34.0)
MCHC: 31.6 g/dL (ref 30.0–36.0)
MCV: 99 fL (ref 80.0–100.0)
Platelets: 556 10*3/uL — ABNORMAL HIGH (ref 150–400)
RBC: 2.94 MIL/uL — ABNORMAL LOW (ref 4.22–5.81)
RDW: 19.6 % — ABNORMAL HIGH (ref 11.5–15.5)
WBC: 25.4 10*3/uL — ABNORMAL HIGH (ref 4.0–10.5)
nRBC: 0 % (ref 0.0–0.2)

## 2018-05-22 LAB — BASIC METABOLIC PANEL
Anion gap: 10 (ref 5–15)
BUN: 23 mg/dL (ref 8–23)
CO2: 26 mmol/L (ref 22–32)
Calcium: 8.5 mg/dL — ABNORMAL LOW (ref 8.9–10.3)
Chloride: 97 mmol/L — ABNORMAL LOW (ref 98–111)
Creatinine, Ser: 1.06 mg/dL (ref 0.61–1.24)
GFR calc Af Amer: >60 mL/min (ref >60)
GFR calc non Af Amer: >60 mL/min (ref >60)
Glucose, Bld: 319 mg/dL — ABNORMAL HIGH (ref 70–99)
POTASSIUM: 4.6 mmol/L (ref 3.5–5.1)
Sodium: 133 mmol/L — ABNORMAL LOW (ref 135–145)

## 2018-05-22 LAB — PROTIME-INR
INR: 4.79
Prothrombin Time: 44.1 seconds — ABNORMAL HIGH (ref 11.4–15.2)

## 2018-05-22 LAB — GLUCOSE, CAPILLARY
GLUCOSE-CAPILLARY: 337 mg/dL — AB (ref 70–99)
Glucose-Capillary: 283 mg/dL — ABNORMAL HIGH (ref 70–99)
Glucose-Capillary: 391 mg/dL — ABNORMAL HIGH (ref 70–99)
Glucose-Capillary: 314 mg/dL — ABNORMAL HIGH (ref 70–99)

## 2018-05-22 MED ORDER — INSULIN ASPART 100 UNIT/ML ~~LOC~~ SOLN
3.0000 [IU] | Freq: Three times a day (TID) | SUBCUTANEOUS | Status: DC
  Administered 2018-05-22 – 2018-05-24 (×5): 3 [IU] via SUBCUTANEOUS

## 2018-05-22 NOTE — Evaluation (Signed)
Physical Therapy Evaluation Patient Details Name: Oscar Baker MRN: [id number] DOB: 06-18-1933 Today's Date: 05/22/2018   History of Present Illness  84 year old male with history of tobacco use, laryngopharyngeal reflux disease, hypertension, hyperlipidemia, diabetes mellitus, asthma, A. fib on chronic Coumadin, chronic systolic CHF with an EF of 40%, CLL, CAD, sick sinus syndrome status post pacemaker, prostate cancer status post radical prostatectomy, who was admitted to the hospital on 11/30 with progressive worsening shortness of breath and cough, with initial chest x-ray showing evidence of pneumonia.  He was placed on IV antibiotics.     Clinical Impression  Pt admitted with above diagnosis. Pt currently with functional limitations due to the deficits listed below (see PT Problem List). PTA pt living at home, wife reports over last year he has had PNA several times as well as a severe ear infection that he has never been able to walk without AD since. Numerous falls at home. Today patient is walking short distances with RW,  Focused on balance exercises and lengthy discussion with family regarding safety considerations at home. Would like to trial rollator next PT visit to deem whether it is supportive  enough with balance deficits. Pt will benefit from skilled PT to increase their independence and safety with mobility to allow discharge to the venue listed below.       Follow Up Recommendations Outpatient PT(Neuro PT for Balance )    Equipment Recommendations  None recommended by PT    Recommendations for Other Services       Precautions / Restrictions Precautions Precautions: Fall Restrictions Weight Bearing Restrictions: No      Mobility  Bed Mobility               General bed mobility comments: OOB at entry  Transfers Overall transfer level: Needs assistance Equipment used: Rolling walker (2 wheeled);None Transfers: Sit to/from Stand Sit to Stand:  Supervision;Min guard         General transfer comment: unsteady standing, needs UE support for saferty without guarding  Ambulation/Gait Ambulation/Gait assistance: Min guard Gait Distance (Feet): 40 Feet Assistive device: Rolling walker (2 wheeled) Gait Pattern/deviations: Step-to pattern;Step-through pattern Gait velocity: decreased   General Gait Details: pt with unsteady gait, unabel to walk without RW. states he uses rollator at home, question safety with this but will trial next viist.   Stairs            Wheelchair Mobility    Modified Rankin (Stroke Patients Only)       Balance Overall balance assessment: History of Falls(pt cannot walk safely without UE support.)                                           Pertinent Vitals/Pain Pain Assessment: No/denies pain    Home Living Family/patient expects to be discharged to:: Private residence Living Arrangements: Spouse/significant other Available Help at Discharge: Family;Available PRN/intermittently Type of Home: Apartment Home Access: Level entry     Home Layout: One level Home Equipment: Grab bars - toilet;Grab bars - tub/shower;Walker - 2 wheels Additional Comments: 2    Prior Function Level of Independence: Independent         Comments: uses walker at home, community ambulation, driving, falls x3 this year.      Hand Dominance        Extremity/Trunk Assessment   Upper Extremity Assessment Upper Extremity Assessment: Overall  WFL for tasks assessed    Lower Extremity Assessment Lower Extremity Assessment: Overall WFL for tasks assessed       Communication   Communication: HOH  Cognition Arousal/Alertness: Awake/alert                                            General Comments General comments (skin integrity, edema, etc.): lenghty discussion with family over role of PT, balance training, home safety considerations to reduce risk of fall and level  of supervision recomended for safety.     Exercises Other Exercises Other Exercises: romberg EO and EC, semi tandem EO EC x6 Other Exercises: marching in place with 1 UE support   Assessment/Plan    PT Assessment Patient needs continued PT services  PT Problem List Decreased activity tolerance;Decreased balance       PT Treatment Interventions DME instruction;Gait training;Functional mobility training;Stair training;Therapeutic activities;Therapeutic exercise;Balance training    PT Goals (Current goals can be found in the Care Plan section)  Acute Rehab PT Goals Patient Stated Goal: stop falling, walk without walker PT Goal Formulation: With patient/family Potential to Achieve Goals: Fair    Frequency Min 3X/week   Barriers to discharge        Co-evaluation               AM-PAC PT "6 Clicks" Mobility  Outcome Measure Help needed turning from your back to your side while in a flat bed without using bedrails?: None Help needed moving from lying on your back to sitting on the side of a flat bed without using bedrails?: None Help needed moving to and from a bed to a chair (including a wheelchair)?: A Little Help needed standing up from a chair using your arms (e.g., wheelchair or bedside chair)?: A Little Help needed to walk in hospital room?: A Little Help needed climbing 3-5 steps with a railing? : A Little 6 Click Score: 20    End of Session Equipment Utilized During Treatment: Gait belt Activity Tolerance: Patient tolerated treatment well Patient left: in chair;with call bell/phone within reach;with chair alarm set;with family/visitor present Nurse Communication: Mobility status PT Visit Diagnosis: Unsteadiness on feet (R26.81);Muscle weakness (generalized) (M62.81)    Time: 0901-1000 PT Time Calculation (min) (ACUTE ONLY): 59 min   Charges:   PT Evaluation $PT Eval Moderate Complexity: 1 Mod PT Treatments $Gait Training: 8-22 mins $Therapeutic Exercise:  8-22 mins $Self Care/Home Management: 8-22        Reinaldo Berber, PT, DPT Acute Rehabilitation Services Pager: [phone] Office: [phone]    Reinaldo Berber 05/22/2018, 4:00 PM

## 2018-05-22 NOTE — Progress Notes (Signed)
PROGRESS NOTE  Oscar Baker DJM:[id number] DOB: 01-Feb-1933 DOA: 05/20/2018 PCP: [name], MD   LOS: 1 day   Brief Narrative / Interim history: 84 year old male with history of tobacco use, laryngopharyngeal reflux disease, hypertension, hyperlipidemia, diabetes mellitus, asthma, A. fib on chronic Coumadin, chronic systolic CHF with an EF of 40%, CLL, CAD, sick sinus syndrome status post pacemaker, prostate cancer status post radical prostatectomy, who was admitted to the hospital on 11/30 with progressive worsening shortness of breath and cough, with initial chest x-ray showing evidence of pneumonia.  He was placed on IV antibiotics.  Subjective: -Feeling improved when compared to yesterday however still significantly short of breath with minimal activities.  Denies any chest pain.  States that he continues to have a productive cough  Assessment & Plan: Principal Problem:   Acute respiratory failure with hypoxia (HCC) Active Problems:   PAF (paroxysmal atrial fibrillation) (HCC)   HTN (hypertension)   Hyperlipidemia   Diabetes mellitus type 2, noninsulin dependent (HCC)   LPRD (laryngopharyngeal reflux disease)   Chronic systolic CHF (congestive heart failure) (HCC)   Anemia   CLL (chronic lymphocytic leukemia) (HCC)   CAD (coronary artery disease)   Asthma exacerbation   Aspiration pneumonia (HCC)   Shoulder strain, right, initial encounter   Principal Problem Acute hypoxic respiratory failure in the setting of pneumonia, possible aspiration pneumonia -With history of laryngopharyngeal reflux disease, speech consulted, appreciate input -There is a component of asthma/COPD exacerbation, he has known history of cough variant asthma and prior tobacco abuse suggesting potentially underlying COPD -Remains hypoxic, continue to attempt to wean off oxygen, at baseline he does not use supplemental O2 at home -Continue steroids, antibiotics with metronidazole and Levaquin -Cultures  have remained negative  Additional Problems Laryngopharyngeal reflux disease -Speech following, patient is aware of high risk of aspiration, he was advised in the past to use thickened liquids however he chooses not to and understands the increased risks -Extensive discussion with patient and his wife at bedside today regarding aspiration precautions, sitting upright when eating as well as 2-hour afterwards, incentive spirometry use at home several times throughout the day, increase activity level and be compliant with dietary recommendation regarding the dysphagia diet along with thickened liquids if he tolerates  Permanent A. fib/sick sinus syndrome/presence of pacemaker -Continue telemetry, Coumadin for anticoagulation and Coreg for rate control  Hypertension -Continue home medications  Hyperlipidemia -Continue home medications  Diabetes mellitus -Continue sliding scale, CBGs are slightly high in the setting of steroid use and will increase his insulin regimen  Chronic systolic CHF -Has some pitting dependent edema but does not appear to be significantly fluid overloaded, continue home oral Lasix  CLL -Under observation by Dr. Alen Blew  Coronary artery disease -No chest pain, continue current regimen  Shoulder pain -No acute findings on the x-ray  Scheduled Meds: . atorvastatin  20 mg Oral q1800  . carvedilol  3.125 mg Oral BID WC  . fenofibrate  160 mg Oral Daily  . furosemide  20 mg Oral Daily  . insulin aspart  0-5 Units Subcutaneous QHS  . insulin aspart  0-9 Units Subcutaneous TID WC  . insulin glargine  5 Units Subcutaneous QHS  . ipratropium  0.5 mg Nebulization QID  . levalbuterol  1.25 mg Nebulization QID  . loratadine  10 mg Oral Daily  . mouth rinse  15 mL Mouth Rinse BID  . methylPREDNISolone (SOLU-MEDROL) injection  60 mg Intravenous Q8H  . metroNIDAZOLE  250 mg Oral Q8H  .  multivitamin with minerals  1 tablet Oral Daily  . vitamin B-12  1,000 mcg Oral  Daily   Continuous Infusions: . levofloxacin (LEVAQUIN) IV 750 mg (05/21/18 2207)   PRN Meds:.acetaminophen, guaiFENesin, hydrALAZINE, hydrOXYzine, oxyCODONE-acetaminophen, zolpidem  DVT prophylaxis: On Coumadin Code Status: Partial code Family Communication: Wife present at bedside Disposition Plan: Home when improved  Consultants:   None  Procedures:   None   Antimicrobials:  Levaquin 11/30 >>  Metronidazole 11/30 >>  Objective: Vitals:   05/21/18 2350 05/22/18 0820 05/22/18 0834 05/22/18 1230  BP: 108/62 112/74    Pulse:  68 67   Resp: 15 12 17    Temp: 98 F (36.7 C) 97.7 F (36.5 C)    TempSrc: Oral Oral    SpO2: 92% 93% 95% 90%  Weight:      Height:        Intake/Output Summary (Last 24 hours) at 05/22/2018 1426 Last data filed at 05/22/2018 0500 Gross per 24 hour  Intake -  Output 1400 ml  Net -1400 ml   Filed Weights   05/20/18 1845  Weight: 95.7 kg    Examination:  Constitutional: NAD Eyes: PERRL, lids and conjunctivae normal ENMT: Mucous membranes are moist.  Neck: normal, supple Respiratory: Bilateral rhonchi throughout both lung fields but mostly at the bases, scattered end expiratory wheezing, no crackles.  increased respiratory effort Cardiovascular: Regular rate and rhythm, no murmurs / rubs / gallops.  Trace LE edema.  Abdomen: no tenderness. Bowel sounds positive.  Musculoskeletal: no clubbing / cyanosis.  Skin: no rashes seen Neurologic: CN 2-12 grossly intact. Strength 5/5 in all 4.  Psychiatric: Normal judgment and insight. Alert and oriented x 3. Normal mood.    Data Reviewed: I have independently reviewed following labs and imaging studies   Chest x-ray on admission -Right lower lobe pneumonia  EKG -Atrial fibrillation, ventricular paced  CBC: Recent Labs  Lab 05/17/18 0805 05/20/18 1915 05/22/18 0312  WBC 43.9* 43.1* 25.4*  NEUTROABS 15.2* 17.0*  --   HGB 10.8* 10.8* 9.2*  HCT 33.1* 34.3* 29.1*  MCV 99.7 102.4*  99.0  PLT 418* 523* 263*   Basic Metabolic Panel: Recent Labs  Lab 05/17/18 0805 05/20/18 1915 05/22/18 0312  NA 138 134* 133*  K 4.6 4.3 4.6  CL 105 102 97*  CO2 23 23 26   GLUCOSE 288* 249* 319*  BUN 16 17 23   CREATININE 1.11 0.96 1.06  CALCIUM 9.0 8.8* 8.5*   GFR: Estimated Creatinine Clearance: 61.8 mL/min (by C-G formula based on SCr of 1.06 mg/dL). Liver Function Tests: Recent Labs  Lab 05/17/18 0805  AST 18  ALT 11  ALKPHOS 71  BILITOT 0.9  PROT 6.4*  ALBUMIN 3.0*   No results for input(s): LIPASE, AMYLASE in the last 168 hours. No results for input(s): AMMONIA in the last 168 hours. Coagulation Profile: Recent Labs  Lab 05/20/18 1915 05/21/18 0229 05/22/18 0312  INR 4.35* 4.81* 4.79*   Cardiac Enzymes: Recent Labs  Lab 05/20/18 1905  TROPONINI <0.03   BNP (last 3 results) No results for input(s): PROBNP in the last 8760 hours. HbA1C: No results for input(s): HGBA1C in the last 72 hours. CBG: Recent Labs  Lab 05/21/18 1132 05/21/18 1619 05/21/18 2139 05/22/18 0821 05/22/18 1130  GLUCAP 275* 378* 375* 283* 391*   Lipid Profile: No results for input(s): CHOL, HDL, LDLCALC, TRIG, CHOLHDL, LDLDIRECT in the last 72 hours. Thyroid Function Tests: No results for input(s): TSH, T4TOTAL, FREET4, T3FREE, THYROIDAB in the  last 72 hours. Anemia Panel: No results for input(s): VITAMINB12, FOLATE, FERRITIN, TIBC, IRON, RETICCTPCT in the last 72 hours. Urine analysis:    Component Value Date/Time   COLORURINE YELLOW 11/27/2017 1454   APPEARANCEUR HAZY (A) 11/27/2017 1454   LABSPEC 1.018 11/27/2017 1454   PHURINE 7.0 11/27/2017 1454   GLUCOSEU >=500 (A) 11/27/2017 1454   HGBUR SMALL (A) 11/27/2017 1454   BILIRUBINUR NEGATIVE 11/27/2017 1454   KETONESUR NEGATIVE 11/27/2017 1454   PROTEINUR NEGATIVE 11/27/2017 1454   NITRITE NEGATIVE 11/27/2017 1454   LEUKOCYTESUR MODERATE (A) 11/27/2017 1454   Sepsis Labs: Invalid input(s): PROCALCITONIN,  LACTICIDVEN  Recent Results (from the past 240 hour(s))  Culture, blood (routine x 2)     Status: None (Preliminary result)   Collection Time: 05/20/18  8:50 PM  Result Value Ref Range Status   Specimen Description   Final    BLOOD LEFT ANTECUBITAL Performed at [facility], [address] [place], [place] [address]    Special Requests   Final    BOTTLES DRAWN AEROBIC AND ANAEROBIC Blood Culture adequate volume Performed at [facility], [address] [place], [name] [address]    Culture   Final    NO GROWTH 2 DAYS Performed at [facility], [place] [address], [place], [place] [address]    Report Status PENDING  Incomplete  Culture, blood (routine x 2)     Status: None (Preliminary result)   Collection Time: 05/20/18  8:52 PM  Result Value Ref Range Status   Specimen Description BLOOD LEFT HAND  Final   Special Requests   Final    BOTTLES DRAWN AEROBIC AND ANAEROBIC Blood Culture adequate volume Performed at [name], [address] [place], [name] [address]    Culture   Final    NO GROWTH 2 DAYS Performed at [facility], [place] [address], [place], [place] [address]    Report Status PENDING  Incomplete      Radiology Studies: Dg Chest 2 View  Result Date: 05/20/2018 CLINICAL DATA:  Chest pain EXAM: CHEST - 2 VIEW COMPARISON:  05/09/2018, 02/23/2018 FINDINGS: Left-sided pacing device as before. Right lower lobe airspace disease suspicious for a pneumonia. No pleural effusion. Stable cardiomediastinal silhouette with borderline cardiomegaly. Aortic atherosclerosis. No pneumothorax. IMPRESSION: Right lower lobe focal airspace opacities suspicious for pneumonia. Radiographic follow-up in 4-6 weeks following antibiotic trial recommended to ensure clearing. Electronically Signed   By: Donavan Foil M.D.   On: 05/20/2018 19:50   Dg Shoulder Right  Result Date: 05/20/2018 CLINICAL DATA:  Pain in the shoulder EXAM: RIGHT SHOULDER -  2+ VIEW COMPARISON:  None. FINDINGS: Mild AC joint degenerative change. Mild glenohumeral degenerative changes. No acute fracture or dislocation. The right lung apex is clear IMPRESSION: No acute osseous abnormality Electronically Signed   By: Donavan Foil M.D.   On: 05/20/2018 19:49     Time spent: 35 minutes, more than 50% at bedside involvement education regarding aspiration precautions, answering questions, counseling regarding disease process and recurrent aspiration pneumonia  Marzetta Board, MD, PhD [facility] Pager [phone]  If 7PM-7AM, please contact night-coverage [email or web address] Password [email or web address] 05/22/2018, 2:26 PM

## 2018-05-22 NOTE — Progress Notes (Signed)
ANTICOAGULATION CONSULT NOTE - Initial Consult  Pharmacy Consult for Warfarin Indication: atrial fibrillation  Allergies  Allergen Reactions  . Altace [Ramipril] Other (See Comments)    "throat felt like had a knot in it"  . Augmentin [Amoxicillin-Pot Clavulanate] Diarrhea    severe  . Codeine Nausea And Vomiting    Nausea and vomiting   . Simvastatin Other (See Comments)    Leg aches    Patient Measurements: Height: 5' 10.5" (179.1 cm) Weight: 210 lb 15.7 oz (95.7 kg) IBW/kg (Calculated) : 74.15  Vital Signs: Temp: 98 F (36.7 C) (11/30 2350) Temp Source: Oral (11/30 2350) BP: 108/62 (11/30 2350)  Labs: Recent Labs    05/20/18 1905 05/20/18 1915 05/21/18 0229 05/22/18 0312  HGB  --  10.8*  --  9.2*  HCT  --  34.3*  --  29.1*  PLT  --  523*  --  556*  LABPROT  --  40.9* 44.3* 44.1*  INR  --  4.35* 4.81* 4.79*  CREATININE  --  0.96  --  1.06  TROPONINI <0.03  --   --   --     Estimated Creatinine Clearance: 61.8 mL/min (by C-G formula based on SCr of 1.06 mg/dL).   Medical History: Past Medical History:  Diagnosis Date  . Asthma 1950's   history of  . Atrial fibrillation ([place])   . Bilateral carpal tunnel syndrome   . Bilateral lower extremity edema   . Bladder tumor   . Chronic systolic heart failure (HCC)    Echo 1/19: Mild LVH, EF 45-50, inf HK, MAC, severe LAE, severe RAE // Echo 7/15: Mild LVH, mod focal basal sept hypertrophy, EF 55-60, AV peak and mean 16/9, trivial MR, mod LAE, PASP 38  . CLL (chronic lymphocytic leukemia) [facility]) oncologist-  dr Ilene Qua--   dx [phone] ;  Lymphocytosis, CLL - per lov note 05-11-2017 currently under active survillance,  CT 04-17-2014 show very small lymphadenopathy, no indication for treatment  . Coronary artery disease    cardiologist-  dr Cathie Olden--  08-18-2017 Intermittant risk nuclear study w/ large area of inferior infartion with no evidence ishcemia   . 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 Diabetes mellitus type 2,  noninsulin dependent ([place])   . Elevated PSA    since prostatectomy but now resolved  . Hematuria 04/2017  . History of ear infection    Right  . History of MI (myocardial infarction)    per myoview nuclear study 08-18-2017 , unknown when  . History of shingles 08/2017   L ear and scalp, possible  . Hyperlipidemia   . Hypertension   . Ischemic cardiomyopathy 09/01/2017   Presumed +CAD with Nuclear stress test 08/18/17 - Inferior scar, no ischemia, intermediate risk // med management unless +angina or worse dyspnea  . OA (osteoarthritis)   . Pacemaker 02/08/2014   followed by dr g. taylor--  single chamber Biotronik due to SSS  . Permanent atrial fibrillation   . Pneumonia 2019   Left lung  . Prostate cancer [facility]) urologist-  dr Diona Fanti   dx 2004--  Gleason 8, PSA 10.45--  11-28-2002  s/p  radical prostatectomy;  recurrent w/ increasing PSA, started ADT treatment  . RBBB (right bundle branch block)   . Sick sinus syndrome ([place])    a-Flutter with episodes of bradycardia; S/P Biotronik (serial number [id number]) 02-08-2014  . Urinary incontinence   . Wears hearing aid in right ear    receiver and transmitter   Assessment:  Pt is a 84 YO M on warfarin PTA for Afib. INR on admission supratherapeutic at 4.35. INR remains supratherapeutic today at 4.79. No reports of bleeding, Hgb 9.2, plts ok  PTA regimen = 29m daily, except 7.546mon Saturday  Pt reports last dose taken 11/29   Goal of Therapy:  INR 2-3 Monitor platelets by anticoagulation protocol: Yes   Plan:  Hold warfarin again today Monitor daily INR, s/sx bleeding, CBC  TaJuanell FairlyPharmD PGY1 Pharmacy Resident Phone ([phone]/06/2017 8:03 AM

## 2018-05-22 NOTE — Progress Notes (Signed)
CRITICAL VALUE ALERT  Critical Value:  INR 4.81  Date & Time Notied:  0627  Provider Notified: Kennon Holter NP/ [facility] Pharmacist  Orders Received/Actions taken: none

## 2018-05-23 LAB — CBC
HCT: 29.4 % — ABNORMAL LOW (ref 39.0–52.0)
HEMOGLOBIN: 9.3 g/dL — AB (ref 13.0–17.0)
MCH: 30.9 pg (ref 26.0–34.0)
MCHC: 31.6 g/dL (ref 30.0–36.0)
MCV: 97.7 fL (ref 80.0–100.0)
NRBC: 0 % (ref 0.0–0.2)
Platelets: 560 10*3/uL — ABNORMAL HIGH (ref 150–400)
RBC: 3.01 MIL/uL — ABNORMAL LOW (ref 4.22–5.81)
RDW: 19.4 % — ABNORMAL HIGH (ref 11.5–15.5)
WBC: 22.1 10*3/uL — ABNORMAL HIGH (ref 4.0–10.5)

## 2018-05-23 LAB — PROTIME-INR
INR: 3.14
Prothrombin Time: 31.8 seconds — ABNORMAL HIGH (ref 11.4–15.2)

## 2018-05-23 LAB — BASIC METABOLIC PANEL
Anion gap: 6 (ref 5–15)
BUN: 30 mg/dL — ABNORMAL HIGH (ref 8–23)
CHLORIDE: 99 mmol/L (ref 98–111)
CO2: 29 mmol/L (ref 22–32)
Calcium: 8.4 mg/dL — ABNORMAL LOW (ref 8.9–10.3)
Creatinine, Ser: 0.84 mg/dL (ref 0.61–1.24)
GFR calc Af Amer: >60 mL/min (ref >60)
GFR calc non Af Amer: >60 mL/min (ref >60)
Glucose, Bld: 257 mg/dL — ABNORMAL HIGH (ref 70–99)
Potassium: 4.6 mmol/L (ref 3.5–5.1)
Sodium: 134 mmol/L — ABNORMAL LOW (ref 135–145)

## 2018-05-23 LAB — EXPECTORATED SPUTUM ASSESSMENT W GRAM STAIN, RFLX TO RESP C

## 2018-05-23 LAB — GLUCOSE, CAPILLARY
GLUCOSE-CAPILLARY: 243 mg/dL — AB (ref 70–99)
Glucose-Capillary: 298 mg/dL — ABNORMAL HIGH (ref 70–99)
Glucose-Capillary: 424 mg/dL — ABNORMAL HIGH (ref 70–99)
Glucose-Capillary: 205 mg/dL — ABNORMAL HIGH (ref 70–99)

## 2018-05-23 MED ORDER — WARFARIN SODIUM 2 MG PO TABS
2.0000 mg | ORAL_TABLET | Freq: Once | ORAL | Status: AC
  Administered 2018-05-23: 2 mg via ORAL
  Filled 2018-05-23: qty 1

## 2018-05-23 MED ORDER — WARFARIN - PHARMACIST DOSING INPATIENT: Freq: Every day | Status: DC

## 2018-05-23 MED ORDER — PREDNISONE 20 MG PO TABS
40.0000 mg | ORAL_TABLET | Freq: Every day | ORAL | Status: DC
  Administered 2018-05-24: 40 mg via ORAL
  Filled 2018-05-23: qty 2

## 2018-05-23 MED ORDER — LEVOFLOXACIN 750 MG PO TABS
750.0000 mg | ORAL_TABLET | Freq: Every day | ORAL | Status: DC
  Administered 2018-05-23: 750 mg via ORAL
  Filled 2018-05-23: qty 1

## 2018-05-23 MED ORDER — LEVALBUTEROL HCL 1.25 MG/0.5ML IN NEBU
1.2500 mg | INHALATION_SOLUTION | Freq: Three times a day (TID) | RESPIRATORY_TRACT | Status: DC
  Administered 2018-05-23 (×2): 1.25 mg via RESPIRATORY_TRACT
  Filled 2018-05-23 (×2): qty 0.5

## 2018-05-23 MED ORDER — METHYLPREDNISOLONE SODIUM SUCC 125 MG IJ SOLR
60.0000 mg | Freq: Two times a day (BID) | INTRAMUSCULAR | Status: AC
  Administered 2018-05-23: 60 mg via INTRAVENOUS
  Filled 2018-05-23: qty 2

## 2018-05-23 MED ORDER — IPRATROPIUM BROMIDE 0.02 % IN SOLN
0.5000 mg | Freq: Three times a day (TID) | RESPIRATORY_TRACT | Status: DC
  Administered 2018-05-23 (×2): 0.5 mg via RESPIRATORY_TRACT
  Filled 2018-05-23 (×2): qty 2.5

## 2018-05-23 MED ORDER — LEVALBUTEROL HCL 1.25 MG/0.5ML IN NEBU
1.2500 mg | INHALATION_SOLUTION | Freq: Two times a day (BID) | RESPIRATORY_TRACT | Status: DC
  Administered 2018-05-24: 1.25 mg via RESPIRATORY_TRACT
  Filled 2018-05-23: qty 0.5

## 2018-05-23 MED ORDER — IPRATROPIUM BROMIDE 0.02 % IN SOLN
0.5000 mg | Freq: Two times a day (BID) | RESPIRATORY_TRACT | Status: DC
  Administered 2018-05-24: 0.5 mg via RESPIRATORY_TRACT
  Filled 2018-05-23: qty 2.5

## 2018-05-23 NOTE — Progress Notes (Signed)
PROGRESS NOTE  Oscar Baker UDJ:[id number] DOB: 10-25-1932 DOA: 05/20/2018 PCP: [name], MD   LOS: 2 days   Brief Narrative / Interim history: 84 year old male with history of tobacco use, laryngopharyngeal reflux disease, hypertension, hyperlipidemia, diabetes mellitus, asthma, A. fib on chronic Coumadin, chronic systolic CHF with an EF of 40%, CLL, CAD, sick sinus syndrome status post pacemaker, prostate cancer status post radical prostatectomy, who was admitted to the hospital on 11/30 with progressive worsening shortness of breath and cough, with initial chest x-ray showing evidence of pneumonia.  He was placed on IV antibiotics.  Subjective: -Continues to improve a little bit, has had a "rough night" due to cough and being awoken by the staff several times for medication ministration and vital signs.  No chest pain, no palpitations  Assessment & Plan: Principal Problem:   Acute respiratory failure with hypoxia (HCC) Active Problems:   PAF (paroxysmal atrial fibrillation) (HCC)   HTN (hypertension)   Hyperlipidemia   Diabetes mellitus type 2, noninsulin dependent (HCC)   LPRD (laryngopharyngeal reflux disease)   Chronic systolic CHF (congestive heart failure) (HCC)   Anemia   CLL (chronic lymphocytic leukemia) (HCC)   CAD (coronary artery disease)   Asthma exacerbation   Aspiration pneumonia (HCC)   Shoulder strain, right, initial encounter   Principal Problem Acute hypoxic respiratory failure in the setting of pneumonia, possible aspiration pneumonia -With history of laryngopharyngeal reflux disease, speech consulted, appreciate input -There is a component of asthma/COPD exacerbation, he has known history of cough variant asthma and prior tobacco abuse suggesting potentially underlying COPD -Continue steroids, antibiotics with metronidazole and Levaquin -Cultures have remained negative -Remains slightly hypoxic this morning, will attempt ambulation with and without  oxygen later on today -Convert to p.o. antibiotics, convert to prednisone starting tomorrow  Additional Problems Laryngopharyngeal reflux disease -Speech following, patient is aware of high risk of aspiration, he was advised in the past to use thickened liquids however he chooses not to and understands the increased risks -Extensive discussion with patient and his wife at bedside 12/1 regarding aspiration precautions, sitting upright when eating as well as 2-hour afterwards, incentive spirometry use at home several times throughout the day, increase activity level and be compliant with dietary recommendation regarding the dysphagia diet along with thickened liquids if he tolerates  Permanent A. fib/sick sinus syndrome/presence of pacemaker -Continue telemetry, Coumadin for anticoagulation and Coreg for rate control  Hypertension -Continue home medications  Hyperlipidemia -Continue home medications  Diabetes mellitus -Continue sliding scale, CBGs in the 200s, will keep on same insulin regimen as he will be off of IV steroids in the next 24 hours  Chronic systolic CHF -Has some pitting dependent edema but does not appear to be significantly fluid overloaded, continue home oral Lasix  CLL -Under observation by Dr. Alen Blew, WBC, hemoglobin stable  Coronary artery disease -No chest pain, continue current regimen  Shoulder pain -No acute findings on the x-ray  Scheduled Meds: . atorvastatin  20 mg Oral q1800  . carvedilol  3.125 mg Oral BID WC  . fenofibrate  160 mg Oral Daily  . furosemide  20 mg Oral Daily  . insulin aspart  0-5 Units Subcutaneous QHS  . insulin aspart  0-9 Units Subcutaneous TID WC  . insulin aspart  3 Units Subcutaneous TID WC  . insulin glargine  5 Units Subcutaneous QHS  . ipratropium  0.5 mg Nebulization QID  . levalbuterol  1.25 mg Nebulization QID  . levofloxacin  750 mg Oral q1800  .  loratadine  10 mg Oral Daily  . mouth rinse  15 mL Mouth Rinse  BID  . methylPREDNISolone (SOLU-MEDROL) injection  60 mg Intravenous Q12H  . metroNIDAZOLE  250 mg Oral Q8H  . multivitamin with minerals  1 tablet Oral Daily  . [START ON 05/24/2018] predniSONE  40 mg Oral Q breakfast  . vitamin B-12  1,000 mcg Oral Daily  . warfarin  2 mg Oral ONCE-1800  . Warfarin - Pharmacist Dosing Inpatient   Does not apply q1800   Continuous Infusions:  PRN Meds:.acetaminophen, guaiFENesin, hydrALAZINE, hydrOXYzine, oxyCODONE-acetaminophen, zolpidem  DVT prophylaxis: On Coumadin Code Status: Partial code Family Communication: No family at bedside this morning Disposition Plan: Home when improved  Consultants:   None  Procedures:   None   Antimicrobials:  Levaquin 11/30 >>  Metronidazole 11/30 >>  Objective: Vitals:   05/22/18 1633 05/22/18 2024 05/22/18 2353 05/23/18 0909  BP: 111/62  117/75   Pulse: (!) 103  66   Resp: 16  18   Temp: 97.9 F (36.6 C)  97.6 F (36.4 C)   TempSrc: Oral  Oral   SpO2: 98% 91% 96% 95%  Weight:      Height:        Intake/Output Summary (Last 24 hours) at 05/23/2018 0952 Last data filed at 05/23/2018 7782 Gross per 24 hour  Intake 270 ml  Output 2500 ml  Net -2230 ml   Filed Weights   05/20/18 1845  Weight: 95.7 kg    Examination:  Constitutional: NAD Eyes: No scleral icterus ENMT: Moist mucous membranes Neck: normal, supple Respiratory: Scattered rhonchi mostly throughout the left lung field today, diminished sounds at bilateral bases.  Slight end expiratory wheezing, good air movement Cardiovascular: Regular rate and rhythm, no murmurs heard.  Trace edema Abdomen: Soft, nontender, nondistended, bowel sounds positive Musculoskeletal: no clubbing / cyanosis.  Skin: No new rashes Neurologic: Nonfocal, equal strength, no focal deficits Psychiatric: Alert and oriented x3   Data Reviewed: I have independently reviewed following labs and imaging studies    CBC: Recent Labs  Lab 2018-05-27 0805  05/20/18 1915 05/22/18 0312 05/23/18 0239  WBC 43.9* 43.1* 25.4* 22.1*  NEUTROABS 15.2* 17.0*  --   --   HGB 10.8* 10.8* 9.2* 9.3*  HCT 33.1* 34.3* 29.1* 29.4*  MCV 99.7 102.4* 99.0 97.7  PLT 418* 523* 556* 423*   Basic Metabolic Panel: Recent Labs  Lab 05/27/2018 0805 05/20/18 1915 05/22/18 0312 05/23/18 0239  NA 138 134* 133* 134*  K 4.6 4.3 4.6 4.6  CL 105 102 97* 99  CO2 23 23 26 29   GLUCOSE 288* 249* 319* 257*  BUN 16 17 23  30*  CREATININE 1.11 0.96 1.06 0.84  CALCIUM 9.0 8.8* 8.5* 8.4*   GFR: Estimated Creatinine Clearance: 78 mL/min (by C-G formula based on SCr of 0.84 mg/dL). Liver Function Tests: Recent Labs  Lab May 27, 2018 0805  AST 18  ALT 11  ALKPHOS 71  BILITOT 0.9  PROT 6.4*  ALBUMIN 3.0*   No results for input(s): LIPASE, AMYLASE in the last 168 hours. No results for input(s): AMMONIA in the last 168 hours. Coagulation Profile: Recent Labs  Lab 05/20/18 1915 05/21/18 0229 05/22/18 0312 05/23/18 0239  INR 4.35* 4.81* 4.79* 3.14   Cardiac Enzymes: Recent Labs  Lab 05/20/18 1905  TROPONINI <0.03   BNP (last 3 results) No results for input(s): PROBNP in the last 8760 hours. HbA1C: No results for input(s): HGBA1C in the last 72 hours. CBG: Recent  Labs  Lab 05/22/18 0821 05/22/18 1130 05/22/18 1635 05/22/18 2132 05/23/18 0722  GLUCAP 283* 391* 314* 337* 298*   Lipid Profile: No results for input(s): CHOL, HDL, LDLCALC, TRIG, CHOLHDL, LDLDIRECT in the last 72 hours. Thyroid Function Tests: No results for input(s): TSH, T4TOTAL, FREET4, T3FREE, THYROIDAB in the last 72 hours. Anemia Panel: No results for input(s): VITAMINB12, FOLATE, FERRITIN, TIBC, IRON, RETICCTPCT in the last 72 hours. Urine analysis:    Component Value Date/Time   COLORURINE YELLOW 11/27/2017 1454   APPEARANCEUR HAZY (A) 11/27/2017 1454   LABSPEC 1.018 11/27/2017 1454   PHURINE 7.0 11/27/2017 1454   GLUCOSEU >=500 (A) 11/27/2017 1454   HGBUR SMALL (A)  11/27/2017 1454   BILIRUBINUR NEGATIVE 11/27/2017 1454   KETONESUR NEGATIVE 11/27/2017 1454   PROTEINUR NEGATIVE 11/27/2017 1454   NITRITE NEGATIVE 11/27/2017 1454   LEUKOCYTESUR MODERATE (A) 11/27/2017 1454   Sepsis Labs: Invalid input(s): PROCALCITONIN, LACTICIDVEN  Recent Results (from the past 240 hour(s))  Culture, blood (routine x 2)     Status: None (Preliminary result)   Collection Time: 05/20/18  8:50 PM  Result Value Ref Range Status   Specimen Description   Final    BLOOD LEFT ANTECUBITAL Performed at [facility], [address] [place], [place] [address]    Special Requests   Final    BOTTLES DRAWN AEROBIC AND ANAEROBIC Blood Culture adequate volume Performed at [facility], [address] [place], [name] [address]    Culture   Final    NO GROWTH 3 DAYS Performed at [facility], [place] [address], [place], [place] [address]    Report Status PENDING  Incomplete  Culture, blood (routine x 2)     Status: None (Preliminary result)   Collection Time: 05/20/18  8:52 PM  Result Value Ref Range Status   Specimen Description BLOOD LEFT HAND  Final   Special Requests   Final    BOTTLES DRAWN AEROBIC AND ANAEROBIC Blood Culture adequate volume Performed at [facility], [address] [place], [name] [address]    Culture   Final    NO GROWTH 3 DAYS Performed at [facility], [place] [address], [place], [place] [address]    Report Status PENDING  Incomplete      Radiology Studies: No results found.   Marzetta Board, MD, PhD [facility] Pager [phone]  If 7PM-7AM, please contact night-coverage [email or web address] Password [facility] 05/23/2018, 9:52 AM

## 2018-05-23 NOTE — Progress Notes (Signed)
[place] for Warfarin Indication: atrial fibrillation  Allergies  Allergen Reactions  . Altace [Ramipril] Other (See Comments)    "throat felt like had a knot in it"  . Augmentin [Amoxicillin-Pot Clavulanate] Diarrhea    severe  . Codeine Nausea And Vomiting    Nausea and vomiting   . Simvastatin Other (See Comments)    Leg aches    Labs: Recent Labs    05/20/18 1905  05/20/18 1915 05/21/18 0229 05/22/18 0312 05/23/18 0239  HGB  --    < > 10.8*  --  9.2* 9.3*  HCT  --   --  34.3*  --  29.1* 29.4*  PLT  --   --  523*  --  556* 560*  LABPROT  --    < > 40.9* 44.3* 44.1* 31.8*  INR  --    < > 4.35* 4.81* 4.79* 3.14  CREATININE  --   --  0.96  --  1.06 0.84  TROPONINI <0.03  --   --   --   --   --    < > = values in this interval not displayed.    Estimated Creatinine Clearance: 78 mL/min (by C-G formula based on SCr of 0.84 mg/dL).   Assessment: Pt is a 84 YO M on warfarin PTA for Afib. INR on admission supratherapeutic at 4.35.   INR today = 3.14  PTA regimen = 5mg  daily, except 7.5mg  on Saturday  Pt reports last dose taken 11/29   Goal of Therapy:  INR 2-3 Monitor platelets by anticoagulation protocol: Yes   Plan:  Warfarin 2 mg po x 1 dose at 1800 pm Monitor daily INR, s/sx bleeding, CBC  Thank you Anette Guarneri, PharmD [phone] 05/23/2018 9:19 AM

## 2018-05-24 LAB — CBC
HCT: 27.9 % — ABNORMAL LOW (ref 39.0–52.0)
Hemoglobin: 9.3 g/dL — ABNORMAL LOW (ref 13.0–17.0)
MCH: 32.2 pg (ref 26.0–34.0)
MCHC: 33.3 g/dL (ref 30.0–36.0)
MCV: 96.5 fL (ref 80.0–100.0)
Platelets: 509 10*3/uL — ABNORMAL HIGH (ref 150–400)
RBC: 2.89 MIL/uL — ABNORMAL LOW (ref 4.22–5.81)
RDW: 19.1 % — ABNORMAL HIGH (ref 11.5–15.5)
WBC: 17.4 10*3/uL — ABNORMAL HIGH (ref 4.0–10.5)
nRBC: 0 % (ref 0.0–0.2)

## 2018-05-24 LAB — PROTIME-INR
INR: 2.7
Prothrombin Time: 28.3 seconds — ABNORMAL HIGH (ref 11.4–15.2)

## 2018-05-24 LAB — GLUCOSE, CAPILLARY
Glucose-Capillary: 300 mg/dL — ABNORMAL HIGH (ref 70–99)
Glucose-Capillary: 385 mg/dL — ABNORMAL HIGH (ref 70–99)

## 2018-05-24 MED ORDER — METRONIDAZOLE 250 MG PO TABS: 250.0000 mg | ORAL_TABLET | Freq: Three times a day (TID) | ORAL | 0 refills | Status: AC

## 2018-05-24 MED ORDER — PREDNISONE 20 MG PO TABS: 20.0000 mg | ORAL_TABLET | Freq: Every day | ORAL | 0 refills | Status: AC

## 2018-05-24 MED ORDER — WARFARIN SODIUM 5 MG PO TABS: 5.0000 mg | ORAL_TABLET | Freq: Once | ORAL | Status: DC

## 2018-05-24 MED ORDER — LEVOFLOXACIN 750 MG PO TABS: 750.0000 mg | ORAL_TABLET | Freq: Every day | ORAL | 0 refills | Status: DC

## 2018-05-24 MED ORDER — LORAZEPAM 1 MG PO TABS
1.0000 mg | ORAL_TABLET | Freq: Once | ORAL | Status: AC
  Administered 2018-05-24: 1 mg via ORAL
  Filled 2018-05-24 (×2): qty 1

## 2018-05-24 NOTE — Discharge Summary (Signed)
Physician Discharge Summary  ERIC NEES KZS:[id number] DOB: 1932-11-27 DOA: 05/20/2018  PCP: [name], MD  Admit date: 05/20/2018 Discharge date: 05/24/2018  Admitted From: home Disposition:  Home   Recommendations for Outpatient Follow-up:  1. Follow up with PCP in 1-2 weeks  Home Health: none Equipment/Devices: none  Discharge Condition: stable CODE STATUS: Partial code Diet recommendation: dysphagia, thick liquids  HPI: Per Dr. Blaine Hamper, Oscar Baker is a 84 y.o. male with medical history significant of former smoker, LPRD (laryngopharyngeal reflux disease), hypertension, hyperlipidemia, diabetes mellitus, asthma, atrial fibrillation on Coumadin,sCHF with EF of 40%, CLL, CAD, myocardial infarction, SSS, pacemaker placement, prostate cancer (s/p ofradical prostatectomy), right bundle blockade,who presents with shortness of breath and cough. Patient has been having shortness of breath and cough forafew weeks.  Patient coughs up brownish colored sputum.  Denies fever, chills.  No chest pain. Patient has oxygen desaturated to 76% on room air in ED. Pulmonologist treated him with amoxicillin and thenazithromycin without improvement.  Per his wife, patient has history of laryngal pharyngeal reflux disease. He is supposed to eat food with food thicker, but he does not do it most of the time.  He probably aspirated something. Patient denies nausea, vomiting, diarrhea, abdominal pain, symptoms of UTI or unilateral weakness.  Patient has chronic poor balance and is using walker at home. Pt states that he injured his right shoulder injury 2 days ago when was lifting a heavy [place] and twisted. He has constant moderate pain over right shoulder and right clavicle area. No numbness in right arm.  Hospital Course:  Principal Problem Acute hypoxic respiratory failure in the setting of pneumonia, possible aspiration pneumonia -With history of laryngopharyngeal reflux disease also concern for  aspiration. There is a component of asthma/COPD exacerbation, he has known history of cough variant asthma and prior tobacco abuse suggesting potentially underlying COPD.  Patient was started on steroids, antibiotics with Levaquin and metronidazole, supplemental oxygen.  He improved with supportive treatment, was able to be weaned off to room air, his leukocytosis improved, he is afebrile and will be discharged home in stable condition to finish antibiotics as an outpatient.  On the day of discharge patient is alert and oriented, able to ambulate in the hallway without shortness of breath, without chest pain, without lightheadedness or dizziness and feeling at baseline.  He was also given a quick prednisone taper  Additional Problems Laryngopharyngeal reflux disease -Speech following, patient is aware of high risk of aspiration, he was advised in the past to use thickened liquids however he chooses not to and understands the increased risks. Extensive discussion with patient and his wife at bedside 12/1 regarding aspiration precautions, sitting upright when eating as well as 2-hour afterwards, incentive spirometry use at home several times throughout the day, increase activity level and be compliant with dietary recommendation regarding the dysphagia diet along with thickened liquids if he tolerates Permanent A. fib/sick sinus syndrome/presence of pacemaker -continue rate control and anticoagulation as before.  Has follow-up with cardiology later this month Hypertension -Continue home medications Hyperlipidemia -Continue home medications Diabetes mellitus -continue home regimen Chronic systolic CHF -Has some pitting dependent edema but does not appear to be significantly fluid overloaded, stable on room air on home oral Lasix and overall appears euvolemic CLL -Under observation by Dr. Alen Blew, WBC, hemoglobin stable Coronary artery disease -No chest pain, continue current regimen Shoulder pain -No acute  findings on the x-ray   Discharge Diagnoses:  Principal Problem:   Acute respiratory  failure with hypoxia (HCC) Active Problems:   PAF (paroxysmal atrial fibrillation) (HCC)   HTN (hypertension)   Hyperlipidemia   Diabetes mellitus type 2, noninsulin dependent (HCC)   LPRD (laryngopharyngeal reflux disease)   Chronic systolic CHF (congestive heart failure) (HCC)   Anemia   CLL (chronic lymphocytic leukemia) (HCC)   CAD (coronary artery disease)   Asthma exacerbation   Aspiration pneumonia ([place])   Shoulder strain, right, initial encounter     Discharge Instructions   Allergies as of 05/24/2018      Reactions   Altace [ramipril] Other (See Comments)   "throat felt like had a knot in it"   Augmentin [amoxicillin-pot Clavulanate] Diarrhea   severe   Codeine Nausea And Vomiting   Nausea and vomiting   Simvastatin Other (See Comments)   Leg aches      Medication List    STOP taking these medications   azithromycin 250 MG tablet Commonly known as:  ZITHROMAX     TAKE these medications   acetaminophen 500 MG tablet Commonly known as:  TYLENOL Take 2 tablets (1,000 mg total) by mouth every 6 (six) hours.   atorvastatin 20 MG tablet Commonly known as:  LIPITOR TAKE ONE TABLET EACH DAY AT 6PM What changed:  See the new instructions.   carvedilol 3.125 MG tablet Commonly known as:  COREG Take 1 tablet (3.125 mg total) by mouth 2 (two) times daily.   fenofibrate micronized 200 MG capsule Commonly known as:  LOFIBRA TAKE 1 CAPSULE BY MOUTH DAILY BEFORE BREAKFAST What changed:  See the new instructions.   furosemide 20 MG tablet Commonly known as:  LASIX Take 1 tablet (20 mg total) by mouth daily.   glipiZIDE 2.5 MG 24 hr tablet Commonly known as:  GLUCOTROL XL Take 2.5 mg by mouth daily with breakfast.   guaiFENesin 600 MG 12 hr tablet Commonly known as:  MUCINEX Take 600 mg by mouth as needed.   levofloxacin 750 MG tablet Commonly known as:   LEVAQUIN Take 1 tablet (750 mg total) by mouth daily at 6 PM.   loratadine 10 MG tablet Commonly known as:  CLARITIN Take 1 tablet (10 mg total) by mouth daily.   metFORMIN 1000 MG tablet Commonly known as:  GLUCOPHAGE Take 1,000 mg by mouth 2 (two) times daily with a meal.   metroNIDAZOLE 250 MG tablet Commonly known as:  FLAGYL Take 1 tablet (250 mg total) by mouth every 8 (eight) hours for 4 days.   multivitamin per tablet Take 1 tablet by mouth daily. Will stop prior to procedure   potassium chloride 10 MEQ tablet Commonly known as:  K-DUR Take 1 tablet (10 mEq total) by mouth daily.   predniSONE 20 MG tablet Commonly known as:  DELTASONE Take 1 tablet (20 mg total) by mouth daily with breakfast for 4 days. Start taking on:  05/25/2018   sitaGLIPtin 100 MG tablet Commonly known as:  JANUVIA Take 100 mg by mouth daily.   vitamin B-12 1000 MCG tablet Commonly known as:  CYANOCOBALAMIN Take 1,000 mcg by mouth daily. Will stop prior to procedure   warfarin 5 MG tablet Commonly known as:  COUMADIN Take as directed. If you are unsure how to take this medication, talk to your nurse or doctor. Original instructions:  TAKE AS DIRECTED BY COUMADIN CLINIC What changed:    how much to take  how to take this  when to take this  additional instructions      Follow-up Information  [facility] Follow up.   Specialty:  Rehabilitation Why:  they will call you to set up apt time, if you do not hear from them in a couple of days, please give them a call. Contact information: [address] [place] [id number] [place] [address] [phone]          Consultations:  None   Procedures/Studies:  Dg Chest 2 View  Result Date: 05/20/2018 CLINICAL DATA:  Chest pain EXAM: CHEST - 2 VIEW COMPARISON:  05/09/2018, 02/23/2018 FINDINGS: Left-sided pacing device as before. Right lower lobe airspace disease  suspicious for a pneumonia. No pleural effusion. Stable cardiomediastinal silhouette with borderline cardiomegaly. Aortic atherosclerosis. No pneumothorax. IMPRESSION: Right lower lobe focal airspace opacities suspicious for pneumonia. Radiographic follow-up in 4-6 weeks following antibiotic trial recommended to ensure clearing. Electronically Signed   By: Donavan Foil M.D.   On: 05/20/2018 19:50   Dg Chest 2 View  Result Date: 05/09/2018 CLINICAL DATA:  Cough variant asthma EXAM: CHEST - 2 VIEW COMPARISON:  02/23/2018 FINDINGS: Single chamber pacer lead from the left in stable position with tip at the right ventricle. Generalized airway thickening. There is no edema, consolidation, effusion, or pneumothorax. Blunting of the left posterior costophrenic sulcus is chronic. IMPRESSION: Generalized bronchitic airway thickening. Electronically Signed   By: Monte Fantasia M.D.   On: 05/09/2018 16:27   Dg Shoulder Right  Result Date: 05/20/2018 CLINICAL DATA:  Pain in the shoulder EXAM: RIGHT SHOULDER - 2+ VIEW COMPARISON:  None. FINDINGS: Mild AC joint degenerative change. Mild glenohumeral degenerative changes. No acute fracture or dislocation. The right lung apex is clear IMPRESSION: No acute osseous abnormality Electronically Signed   By: Donavan Foil M.D.   On: 05/20/2018 19:49     Subjective: - no chest pain, shortness of breath, no abdominal pain, nausea or vomiting.   Discharge Exam: Vitals:   05/24/18 0811 05/24/18 0837  BP: 137/66   Pulse: 63   Resp:    Temp: (!) 97.5 F (36.4 C)   SpO2: 95% 94%    General: Pt is alert, awake, not in acute distress Cardiovascular: Irregular Respiratory: Faint rhonchi left lower lung field, no wheezing, no wheezing Abdominal: Soft, NT, ND, bowel sounds + Extremities: Trace edema, no cyanosis    The results of significant diagnostics from this hospitalization (including imaging, microbiology, ancillary and laboratory) are listed below for  reference.     Microbiology: Recent Results (from the past 240 hour(s))  Culture, blood (routine x 2)     Status: None (Preliminary result)   Collection Time: 05/20/18  8:50 PM  Result Value Ref Range Status   Specimen Description   Final    BLOOD LEFT ANTECUBITAL Performed at [facility], [address] [place], [place] [address]    Special Requests   Final    BOTTLES DRAWN AEROBIC AND ANAEROBIC Blood Culture adequate volume Performed at [facility], [address] [place], [name] [address]    Culture   Final    NO GROWTH 4 DAYS Performed at [facility], [place] [address] [place], [place] [address]    Report Status PENDING  Incomplete  Culture, blood (routine x 2)     Status: None (Preliminary result)   Collection Time: 05/20/18  8:52 PM  Result Value Ref Range Status   Specimen Description BLOOD LEFT HAND  Final   Special Requests   Final    BOTTLES DRAWN AEROBIC AND  ANAEROBIC Blood Culture adequate volume Performed at [facility], [address] [place], [name] [address]    Culture   Final    NO GROWTH 4 DAYS Performed at [facility], [place] [address], [place], [place] [address]    Report Status PENDING  Incomplete  Culture, sputum-assessment     Status: None   Collection Time: 05/21/18  1:07 AM  Result Value Ref Range Status   Specimen Description SPUTUM  Final   Special Requests NONE  Final   Sputum evaluation   Final    THIS SPECIMEN IS ACCEPTABLE FOR SPUTUM CULTURE Performed at [facility], [address], [place], [place] [address]    Report Status 05/23/2018 FINAL  Final  Culture, respiratory     Status: None (Preliminary result)   Collection Time: 05/21/18  1:07 AM  Result Value Ref Range Status   Specimen Description SPUTUM  Final   Special Requests NONE Reflexed from [id number]  Final   Gram Stain   Final    RARE WBC PRESENT,BOTH PMN AND MONONUCLEAR MODERATE GRAM POSITIVE COCCI IN PAIRS IN CHAINS FEW  BUDDING YEAST SEEN MODERATE GRAM VARIABLE ROD FEW SQUAMOUS EPITHELIAL CELLS PRESENT    Culture   Final    CULTURE REINCUBATED FOR BETTER GROWTH Performed at [facility], [place] [address] [place], [place] [address]    Report Status PENDING  Incomplete     Labs: BNP (last 3 results) Recent Labs    07/19/17 1030 11/20/17 1339 05/20/18 1905  BNP 727.6* 717.8* 449.6*   Basic Metabolic Panel: Recent Labs  Lab 05/20/18 1915 05/22/18 0312 05/23/18 0239  NA 134* 133* 134*  K 4.3 4.6 4.6  CL 102 97* 99  CO2 23 26 29   GLUCOSE 249* 319* 257*  BUN 17 23 30*  CREATININE 0.96 1.06 0.84  CALCIUM 8.8* 8.5* 8.4*   Liver Function Tests: No results for input(s): AST, ALT, ALKPHOS, BILITOT, PROT, ALBUMIN in the last 168 hours. No results for input(s): LIPASE, AMYLASE in the last 168 hours. No results for input(s): AMMONIA in the last 168 hours. CBC: Recent Labs  Lab 05/20/18 1915 05/22/18 0312 05/23/18 0239 05/24/18 0230  WBC 43.1* 25.4* 22.1* 17.4*  NEUTROABS 17.0*  --   --   --   HGB 10.8* 9.2* 9.3* 9.3*  HCT 34.3* 29.1* 29.4* 27.9*  MCV 102.4* 99.0 97.7 96.5  PLT 523* 556* 560* 509*   Cardiac Enzymes: Recent Labs  Lab 05/20/18 1905  TROPONINI <0.03   BNP: Invalid input(s): POCBNP CBG: Recent Labs  Lab 05/23/18 1134 05/23/18 1713 05/23/18 2120 05/24/18 0812 05/24/18 1131  GLUCAP 424* 243* 205* 300* 385*   D-Dimer No results for input(s): DDIMER in the last 72 hours. Hgb A1c No results for input(s): HGBA1C in the last 72 hours. Lipid Profile No results for input(s): CHOL, HDL, LDLCALC, TRIG, CHOLHDL, LDLDIRECT in the last 72 hours. Thyroid function studies No results for input(s): TSH, T4TOTAL, T3FREE, THYROIDAB in the last 72 hours.  Invalid input(s): FREET3 Anemia work up No results for input(s): VITAMINB12, FOLATE, FERRITIN, TIBC, IRON, RETICCTPCT in the last 72 hours. Urinalysis    Component Value Date/Time   COLORURINE YELLOW 11/27/2017  1454   APPEARANCEUR HAZY (A) 11/27/2017 1454   LABSPEC 1.018 11/27/2017 1454   PHURINE 7.0 11/27/2017 1454   GLUCOSEU >=500 (A) 11/27/2017 1454   HGBUR SMALL (A) 11/27/2017 1454   BILIRUBINUR NEGATIVE 11/27/2017 1454   KETONESUR NEGATIVE  11/27/2017 [place] 11/27/2017 1454   NITRITE NEGATIVE 11/27/2017 1454   LEUKOCYTESUR MODERATE (A) 11/27/2017 1454   Sepsis Labs Invalid input(s): PROCALCITONIN,  WBC,  LACTICIDVEN   Time coordinating discharge: 35 minutes  SIGNED:  Marzetta Board, MD  [facility] 05/24/2018, 3:20 PM Pager [phone]  If 7PM-7AM, please contact night-coverage [email or web address] Password [email or web address]

## 2018-05-24 NOTE — Progress Notes (Signed)
Physical Therapy Treatment Patient Details Name: Oscar Baker MRN: [id number] DOB: 1932/09/05 Today's Date: 05/24/2018    History of Present Illness 84 year old male with history of tobacco use, laryngopharyngeal reflux disease, hypertension, hyperlipidemia, diabetes mellitus, asthma, A. fib on chronic Coumadin, chronic systolic CHF with an EF of 40%, CLL, CAD, sick sinus syndrome status post pacemaker, prostate cancer status post radical prostatectomy, who was admitted to the hospital on 11/30 with progressive worsening shortness of breath and cough, with initial chest x-ray showing evidence of pneumonia.  He was placed on IV antibiotics.    PT Comments    Pt in bed upon arrival and willing to participate therapy. Pt seemed motivated but was very impulsive throughout session. Pt participated in gait training and LE strengthening exercises requiring UE support and Min assist to maintain balance. Pt ambulated 450 ft with a Rollator this session with Min guard for safety. During standing heel raises pt LOB x1 requiring Mod A to prevent fall. Pt would benefit from continued PT in order to progress toward stated goals and to maximize functional independence. Pt remains appropriate for OP PT based on current functional status.     Follow Up Recommendations  Outpatient PT     Equipment Recommendations  None recommended by PT    Recommendations for Other Services       Precautions / Restrictions Precautions Precautions: Fall Restrictions Weight Bearing Restrictions: No    Mobility  Bed Mobility Overal bed mobility: Needs Assistance Bed Mobility: Sit to Supine       Sit to supine: Min assist   General bed mobility comments: Pt reaching for therapists hands instead of bed or bed rail. Pt became easily aggitated when therapist offered VC. Increased time to perfrom, min Assist required to elevate trunk.   Transfers Overall transfer level: Needs assistance Equipment used:  Agricultural consultant) Transfers: Sit to/from Stand Sit to Stand: Supervision;Min guard         General transfer comment: unsteady standing, needs UE support for saferty without guarding. VC for hand placement.  Ambulation/Gait Ambulation/Gait assistance: Min guard Gait Distance (Feet): 450 Feet Assistive device: (rollator) Gait Pattern/deviations: Step-to pattern;Step-through pattern     General Gait Details: Pt ambulated with a rollator this session. He required VC for safe proximity and maintaining upright posture. Min guard for safety and for navigating obstacles in hallway. Pt able to lock brakes and slow down appropriately. Pt very impulsive throught session.   Stairs             Wheelchair Mobility    Modified Rankin (Stroke Patients Only)       Balance Overall balance assessment: History of Falls;Needs assistance Sitting-balance support: No upper extremity supported;Feet supported Sitting balance-Leahy Scale: Good Sitting balance - Comments: Supervision for safety     Standing balance-Leahy Scale: Poor Standing balance comment: reliant on UE support          [place] - Eyes Opened: 30 [place] - Eyes Closed: 15                Cognition Arousal/Alertness: Awake/alert                                     General Comments: No family present during session      Exercises Total Joint Exercises Ankle Circles/Pumps: Right;Left;AROM;Other (comment)(Pt stated he could not preform and LOB x1 with mod A to prevent fall. 3 reps completed) Hip  ABduction/ADduction: AROM;10 reps;Right;Left;Standing(subsitutions noted and corrected) Marching in Standing: AROM;Right;Left;10 reps;Standing Standing Hip Extension: AROM;Right;Left;10 reps;Standing    General Comments General comments (skin integrity, edema, etc.): Pt's O2 88-94% RA throughout session. instructed on pursed lip breathing.      Pertinent Vitals/Pain Pain Assessment: No/denies pain     Home Living                      Prior Function            PT Goals (current goals can now be found in the care plan section) Acute Rehab PT Goals Patient Stated Goal: stop falling, walk without walker PT Goal Formulation: With patient/family Potential to Achieve Goals: Fair Progress towards PT goals: Progressing toward goals    Frequency    Min 3X/week      PT Plan Current plan remains appropriate    Co-evaluation              AM-PAC PT "6 Clicks" Mobility   Outcome Measure  Help needed turning from your back to your side while in a flat bed without using bedrails?: None Help needed moving from lying on your back to sitting on the side of a flat bed without using bedrails?: None Help needed moving to and from a bed to a chair (including a wheelchair)?: A Little Help needed standing up from a chair using your arms (e.g., wheelchair or bedside chair)?: A Little Help needed to walk in hospital room?: A Little Help needed climbing 3-5 steps with a railing? : A Little 6 Click Score: 20    End of Session Equipment Utilized During Treatment: Gait belt Activity Tolerance: Patient tolerated treatment well Patient left: in chair;with call bell/phone within reach;with chair alarm set Nurse Communication: Mobility status PT Visit Diagnosis: Unsteadiness on feet (R26.81);Muscle weakness (generalized) (M62.81)     Time: 0981-1914 PT Time Calculation (min) (ACUTE ONLY): 25 min  Charges:  $Gait Training: 8-22 mins $Therapeutic Exercise: 8-22 mins                     [address], SPTA  [place] 05/24/2018, 10:47 AM

## 2018-05-24 NOTE — Progress Notes (Signed)
[place] for Warfarin Indication: atrial fibrillation  Allergies  Allergen Reactions  . Altace [Ramipril] Other (See Comments)    "throat felt like had a knot in it"  . Augmentin [Amoxicillin-Pot Clavulanate] Diarrhea    severe  . Codeine Nausea And Vomiting    Nausea and vomiting   . Simvastatin Other (See Comments)    Leg aches    Labs: Recent Labs    05/22/18 0312 05/23/18 0239 05/24/18 0230  HGB 9.2* 9.3* 9.3*  HCT 29.1* 29.4* 27.9*  PLT 556* 560* 509*  LABPROT 44.1* 31.8* 28.3*  INR 4.79* 3.14 2.70  CREATININE 1.06 0.84  --     Estimated Creatinine Clearance: 78 mL/min (by C-G formula based on SCr of 0.84 mg/dL).   Assessment: Pt is a 84 YO M on warfarin PTA for Afib. INR on admission supratherapeutic at 4.35.   INR today = 2.7  PTA regimen = 5mg  daily, except 7.5mg  on Saturday  Pt reports last dose taken 11/29   Goal of Therapy:  INR 2-3 Monitor platelets by anticoagulation protocol: Yes   Plan:  Warfarin 5 mg po x 1 dose at 1800 pm Monitor daily INR, s/sx bleeding, CBC  Thank you Anette Guarneri, PharmD [phone] 05/24/2018 9:00 AM

## 2018-05-24 NOTE — Care Management Note (Signed)
Case Management Note  Patient Details  Name: Oscar Baker MRN: [id number] Date of Birth: 10-03-1932  Subjective/Objective:    From home with wife, for dc today, plan for outpt physical therapy, NCM sent referral thru epic for outpt pt to neuro rehab center.                Action/Plan: DC home today.  Expected Discharge Date:  05/24/18               Expected Discharge Plan:  OP Rehab  In-House Referral:     Discharge planning Services  CM Consult  Post Acute Care Choice:    Choice offered to:     DME Arranged:    DME Agency:     HH Arranged:    HH Agency:     Status of Service:  Completed, signed off  If discussed at [employer] of Stay Meetings, dates discussed:    Additional Comments:  Zenon Mayo, RN 05/24/2018, 12:27 PM

## 2018-05-24 NOTE — Discharge Instructions (Signed)
Follow with [name], MD in 1-2 weeks  Please get a complete blood count and chemistry panel checked by your Primary MD at your next visit, and again as instructed by your Primary MD. Please get your medications reviewed and adjusted by your Primary MD.  Please request your Primary MD to go over all Hospital Tests and Procedure/Radiological results at the follow up, please get all Hospital records sent to your Prim MD by signing hospital release before you go home.  If you had Pneumonia of Lung problems at the Hospital: Please get a 2 view Chest X ray done in 6-8 weeks after hospital discharge or sooner if instructed by your Primary MD.  If you have Congestive Heart Failure: Please call your Cardiologist or Primary MD anytime you have any of the following symptoms:  1) 3 pound weight gain in 24 hours or 5 pounds in 1 week  2) shortness of breath, with or without a dry hacking cough  3) swelling in the hands, feet or stomach  4) if you have to sleep on extra pillows at night in order to breathe  Follow cardiac low salt diet and 1.5 lit/day fluid restriction.  If you have diabetes Accuchecks 4 times/day, Once in AM empty stomach and then before each meal. Log in all results and show them to your primary doctor at your next visit. If any glucose reading is under 80 or above 300 call your primary MD immediately.  If you have Seizure/Convulsions/Epilepsy: Please do not drive, operate heavy machinery, participate in activities at heights or participate in high speed sports until you have seen by Primary MD or a Neurologist and advised to do so again.  If you had Gastrointestinal Bleeding: Please ask your Primary MD to check a complete blood count within one week of discharge or at your next visit. Your endoscopic/colonoscopic biopsies that are pending at the time of discharge, will also need to followed by your Primary MD.  Get Medicines reviewed and adjusted. Please take all your  medications with you for your next visit with your Primary MD  Please request your Primary MD to go over all hospital tests and procedure/radiological results at the follow up, please ask your Primary MD to get all Hospital records sent to his/her office.  If you experience worsening of your admission symptoms, develop shortness of breath, life threatening emergency, suicidal or homicidal thoughts you must seek medical attention immediately by calling 911 or calling your MD immediately  if symptoms less severe.  You must read complete instructions/literature along with all the possible adverse reactions/side effects for all the Medicines you take and that have been prescribed to you. Take any new Medicines after you have completely understood and accpet all the possible adverse reactions/side effects.   Do not drive or operate heavy machinery when taking Pain medications.   Do not take more than prescribed Pain, Sleep and Anxiety Medications  Special Instructions: If you have smoked or chewed Tobacco  in the last 2 yrs please stop smoking, stop any regular Alcohol  and or any Recreational drug use.  Wear Seat belts while driving.  Please note You were cared for by a hospitalist during your hospital stay. If you have any questions about your discharge medications or the care you received while you were in the hospital after you are discharged, you can call the unit and asked to speak with the hospitalist on call if the hospitalist that took care of you is not available. Once  you are discharged, your primary care physician will handle any further medical issues. Please note that NO REFILLS for any discharge medications will be authorized once you are discharged, as it is imperative that you return to your primary care physician (or establish a relationship with a primary care physician if you do not have one) for your aftercare needs so that they can reassess your need for medications and monitor your  lab values.  You can reach the hospitalist office at phone [phone] or fax [phone]   If you do not have a primary care physician, you can call [phone] for a physician referral.  Activity: As tolerated with Full fall precautions use walker/cane & assistance as needed  Diet: heart healthy  Disposition Home

## 2018-05-25 ENCOUNTER — Ambulatory Visit: Payer: Medicare Other | Admitting: Pulmonary Disease

## 2018-05-25 LAB — CULTURE, RESPIRATORY W GRAM STAIN: Culture: NORMAL

## 2018-05-25 LAB — CULTURE, BLOOD (ROUTINE X 2)
CULTURE: NO GROWTH
Culture: NO GROWTH
Special Requests: ADEQUATE
Special Requests: ADEQUATE

## 2018-05-30 LAB — LEGIONELLA PNEUMOPHILA SEROGP 1 UR AG: L. pneumophila Serogp 1 Ur Ag: NEGATIVE

## 2018-06-01 ENCOUNTER — Ambulatory Visit (INDEPENDENT_AMBULATORY_CARE_PROVIDER_SITE_OTHER): Payer: Medicare Other | Admitting: *Deleted

## 2018-06-01 DIAGNOSIS — I4821 Permanent atrial fibrillation: Secondary | ICD-10-CM | POA: Diagnosis not present

## 2018-06-01 DIAGNOSIS — I480 Paroxysmal atrial fibrillation: Secondary | ICD-10-CM | POA: Diagnosis not present

## 2018-06-01 DIAGNOSIS — Z5181 Encounter for therapeutic drug level monitoring: Secondary | ICD-10-CM | POA: Diagnosis not present

## 2018-06-01 LAB — POCT INR: INR: 7.8 — AB (ref 2.0–3.0)

## 2018-06-01 LAB — PROTIME-INR
INR: 8.5 (ref 0.8–1.2)
Prothrombin Time: 79.9 s — ABNORMAL HIGH (ref 9.1–12.0)

## 2018-06-02 ENCOUNTER — Encounter: Payer: Self-pay | Admitting: Physical Therapy

## 2018-06-02 ENCOUNTER — Other Ambulatory Visit: Payer: Self-pay

## 2018-06-02 ENCOUNTER — Ambulatory Visit: Payer: Medicare Other | Attending: Internal Medicine | Admitting: Physical Therapy

## 2018-06-02 VITALS — BP 112/58

## 2018-06-02 DIAGNOSIS — R2689 Other abnormalities of gait and mobility: Secondary | ICD-10-CM | POA: Insufficient documentation

## 2018-06-02 DIAGNOSIS — M6281 Muscle weakness (generalized): Secondary | ICD-10-CM | POA: Insufficient documentation

## 2018-06-02 DIAGNOSIS — I10 Essential (primary) hypertension: Secondary | ICD-10-CM | POA: Diagnosis not present

## 2018-06-02 DIAGNOSIS — R2681 Unsteadiness on feet: Secondary | ICD-10-CM | POA: Diagnosis not present

## 2018-06-02 DIAGNOSIS — J181 Lobar pneumonia, unspecified organism: Secondary | ICD-10-CM | POA: Diagnosis not present

## 2018-06-02 DIAGNOSIS — J9600 Acute respiratory failure, unspecified whether with hypoxia or hypercapnia: Secondary | ICD-10-CM | POA: Diagnosis not present

## 2018-06-02 DIAGNOSIS — I4892 Unspecified atrial flutter: Secondary | ICD-10-CM | POA: Diagnosis not present

## 2018-06-02 DIAGNOSIS — N489 Disorder of penis, unspecified: Secondary | ICD-10-CM | POA: Diagnosis not present

## 2018-06-02 DIAGNOSIS — R293 Abnormal posture: Secondary | ICD-10-CM | POA: Insufficient documentation

## 2018-06-02 DIAGNOSIS — Z6829 Body mass index (BMI) 29.0-29.9, adult: Secondary | ICD-10-CM | POA: Diagnosis not present

## 2018-06-02 DIAGNOSIS — I5022 Chronic systolic (congestive) heart failure: Secondary | ICD-10-CM | POA: Diagnosis not present

## 2018-06-02 DIAGNOSIS — C9100 Acute lymphoblastic leukemia not having achieved remission: Secondary | ICD-10-CM | POA: Diagnosis not present

## 2018-06-02 DIAGNOSIS — E1122 Type 2 diabetes mellitus with diabetic chronic kidney disease: Secondary | ICD-10-CM | POA: Diagnosis not present

## 2018-06-02 DIAGNOSIS — G479 Sleep disorder, unspecified: Secondary | ICD-10-CM | POA: Diagnosis not present

## 2018-06-02 DIAGNOSIS — K219 Gastro-esophageal reflux disease without esophagitis: Secondary | ICD-10-CM | POA: Diagnosis not present

## 2018-06-02 DIAGNOSIS — I2510 Atherosclerotic heart disease of native coronary artery without angina pectoris: Secondary | ICD-10-CM | POA: Diagnosis not present

## 2018-06-03 DIAGNOSIS — N3945 Continuous leakage: Secondary | ICD-10-CM | POA: Diagnosis not present

## 2018-06-03 DIAGNOSIS — N481 Balanitis: Secondary | ICD-10-CM | POA: Diagnosis not present

## 2018-06-03 NOTE — Therapy (Signed)
[facility] [address] [place], [name], [address] Phone: [phone]   Fax:  [phone]  Physical Therapy Evaluation  Patient Details  Name: Oscar Oscar MRN: [id number] Date of Birth: [birth year] Referring Provider (PT): Marzetta Board, MD (hospitalist) [name], MD (PCP)   Encounter Date: 06/02/2018  PT End of Session - 06/02/18 2129    Visit Number  1    Number of Visits  17    Date for PT Re-Evaluation  07/02/18    Authorization Type  Medicare & BCBS supplement    Authorization Time Period  06/02/18 to 08/31/2018    PT Start Time  1450    PT Stop Time  1533    PT Time Calculation (min)  43 min    Activity Tolerance  Patient tolerated treatment well    Behavior During Therapy  [facility] for tasks assessed/performed       Past Medical History:  Diagnosis Date  . Asthma 1950's   history of  . Atrial fibrillation ([place])   . Bilateral carpal tunnel syndrome   . Bilateral lower extremity edema   . Bladder tumor   . Chronic systolic heart failure (HCC)    Echo 1/19: Mild LVH, EF 45-50, inf HK, MAC, severe LAE, severe RAE // Echo 7/15: Mild LVH, mod focal basal sept hypertrophy, EF 55-60, AV peak and mean 16/9, trivial MR, mod LAE, PASP 38  . CLL (chronic lymphocytic leukemia) [facility]) oncologist-  dr Ilene Qua--   dx [phone] ;  Lymphocytosis, CLL - per lov note 05-11-2017 currently under active survillance,  CT 04-17-2014 show very small lymphadenopathy, no indication for treatment  . Coronary artery disease    cardiologist-  dr Cathie Olden--  08-18-2017 Intermittant risk nuclear study w/ large area of inferior infartion with no evidence ishcemia   . 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 Diabetes mellitus type 2, noninsulin dependent ([place])   . Elevated PSA    since prostatectomy but now resolved  . Hematuria 04/2017  . History of ear infection    Right  . History of MI (myocardial infarction)    per myoview nuclear study  08-18-2017 , unknown when  . History of shingles 08/2017   L ear and scalp, possible  . Hyperlipidemia   . Hypertension   . Ischemic cardiomyopathy 09/01/2017   Presumed +CAD with Nuclear stress test 08/18/17 - Inferior scar, no ischemia, intermediate risk // med management unless +angina or worse dyspnea  . OA (osteoarthritis)   . Pacemaker 02/08/2014   followed by dr g. taylor--  single chamber Biotronik due to SSS  . Permanent atrial fibrillation   . Pneumonia 2019   Left lung  . Prostate cancer [facility]) urologist-  dr Diona Fanti   dx 2004--  Gleason 8, PSA 10.45--  11-28-2002  s/p  radical prostatectomy;  recurrent w/ increasing PSA, started ADT treatment  . RBBB (right bundle branch block)   . Sick sinus syndrome ([place])    a-Flutter with episodes of bradycardia; S/P Biotronik (serial number [id number]) 02-08-2014  . Urinary incontinence   . Wears hearing aid in right ear    receiver and transmitter    Past Surgical History:  Procedure Laterality Date  . APPENDECTOMY    . BACK SURGERY     disk  . CARDIAC CATHETERIZATION  09-03-1999  dr Cathie Olden   abnormal cardiolite study:  minor luminal irregularities but no critial coronary artery stenosis  . CARDIOVASCULAR STRESS TEST  08-18-2017  dr Cathie Olden  Intermediate risk nuclear study w/ large area inferior infarction, no evidence of ishcemia (consistant w/ prior MI)/  study not gated due to frequent PVCs  . CARPAL TUNNEL RELEASE Right 2000  . CARPAL TUNNEL RELEASE Left 11/19/2009  . CATARACT EXTRACTION Right 07/2015  . CATARACT EXTRACTION Left 09/2015  . CYSTOSCOPY N/A 11/04/2017   Procedure: CYSTOSCOPY AND CAUTERIZATION OF BLADDER;  Surgeon: Franchot Gallo, MD;  Location: [facility];  Service: Urology;  Laterality: N/A;  . INSERTION PENILE PROSTHESIS  02-22-2004    dr Mattie Marlin  [facility]  . IR THORACENTESIS ASP PLEURAL SPACE W/IMG GUIDE  11/26/2017  . KNEE ARTHROSCOPY Left 07/2010  . PERMANENT PACEMAKER INSERTION N/A 02/08/2014    Procedure: PERMANENT PACEMAKER INSERTION;  Surgeon: Evans Lance, MD;  Location: [facility] CATH LAB;  Service: Cardiovascular;  Laterality: N/A;  . PLEURAL EFFUSION DRAINAGE Left 11/30/2017   Procedure: DRAINAGE OF HEMOTHORAX;  Surgeon: Ivin Poot, MD;  Location: [facility];  Service: Thoracic;  Laterality: Left;  . RADICAL RETROPUBIC PROSTATECTOMY W/ BILATERAL PELVIC LYMPH NODE DISSECTION  11-28-2002   dr Mattie Marlin  [facility]  . TONSILLECTOMY    . TOTAL HIP ARTHROPLASTY Left 05/12/2016   Procedure: LEFT TOTAL HIP ARTHROPLASTY ANTERIOR APPROACH;  Surgeon: Paralee Cancel, MD;  Location: [facility];  Service: Orthopedics;  Laterality: Left;  . TOTAL HIP ARTHROPLASTY Right 07-15-2006   dr Alvan Dame  [facility]  . TRANSTHORACIC ECHOCARDIOGRAM  07/20/2017   mild LVH, ef 45-50%, hypokinesis of the basal-midinferior myocardium, due to AFib unable to evaluate diastolic function/  severe LAE and RAE/  trivial PR and TR  . VIDEO ASSISTED THORACOSCOPY Left 11/30/2017   Procedure: VIDEO ASSISTED THORACOSCOPY;  Surgeon: Ivin Poot, MD;  Location: [facility];  Service: Thoracic;  Laterality: Left;  [name] VIDEO ASSISTED THORACOSCOPY (VATS)/EMPYEMA Left 11/29/2017   Procedure: VIDEO ASSISTED THORACOSCOPY (VATS)/EMPYEMA;  Surgeon: Ivin Poot, MD;  Location: [place];  Service: Thoracic;  Laterality: Left;    Vitals:   06/02/18 1513  BP: (!) 112/58     Subjective Assessment - 06/02/18 1459    Subjective  Since I left the hospital I've been dragging. Low energy. Still coughing some. Dyspnea is not the main problem. Felt great walking in the hsopital (doing laps) and now has not even been able to lift his feet to do his exercises. Has been losing weight (down 10 lbs since home). Was told to stop taking his lasix and feels his legs are swollen about the same.     Patient is accompained by:  Family member    Pertinent History  Left THA 05/12/16, right THA 2008, CHF, MI, DM2, HTN, OA, prostate CA, shingles, A-fib, hearing loss, knee sg,  recent thorascopy.  05/02/2017- bleeding and began with urinary incontinence    Limitations  Sitting;Lifting;Standing;Walking;House hold activities    Patient Stated Goals  work on balance, "be able to walk, get off the walker (I'll live with a cane)".  Patient has used the walker since January when he fell.    Currently in Pain?  No/denies         [facility] PT Assessment - 06/02/18 1513      Assessment   Medical Diagnosis  balance deficit    Referring Provider (PT)  Marzetta Board, MD (hospitalist) [name], MD (PCP)    Onset Date/Surgical Date  --   admitted to hospital 05/21/18   Prior Therapy  10/20/17 to 11/18/17; hospitalized; 01/18/18 to 04/07/18  Precautions   Precautions  Fall      Restrictions   Weight Bearing Restrictions  No      Home Environment   Living Environment  Private residence    Living Arrangements  Spouse/significant other    Available Help at Discharge  Family;Available 24 hours/day    Type of [place] - 2 wheels;Cane - single point;Grab bars - tub/shower;Toilet riser;Walker - 4 wheels    Additional Comments  rollator second-hand from frient       Prior Function   Level of Independence  Requires assistive device for independence;Needs assistance with ADLs    Vocation  Retired      Associate Professor   Overall Cognitive Status  Within Functional Limits for tasks assessed      Sensation   Light Touch  --   denies numbness in feet     Coordination   Gross Motor Movements are Fluid and Coordinated  Yes    Fine Motor Movements are Fluid and Coordinated  Yes      Posture/Postural Control   Posture/Postural Control  Postural limitations    Postural Limitations  Rounded Shoulders;Forward head;Flexed trunk      ROM / Strength   AROM / PROM / Strength  AROM;Strength      AROM   Overall AROM   Within functional limits for tasks performed      Strength   Overall Strength  Deficits     Overall Strength Comments  bil Knee extension 4/5; knee flexion 3+/5; ankle DF 4/5      Transfers   Transfers  Sit to Stand;Stand to Sit    Sit to Stand  5: Supervision    Five time sit to stand comments   32.1   bil UE support off armrests; 04/07/18 17.4 sec   Stand to Sit  6: Modified independent (Device/Increase time)      Ambulation/Gait   Ambulation/Gait Assistance  5: Supervision    Ambulation/Gait Assistance Details  see also 3 minute walk test info;     Ambulation Distance (Feet)  263 Feet    Assistive device  Rollator    Gait Pattern  Step-through pattern;Decreased stride length;Lateral hip instability;Trunk flexed;Wide base of support   incr reliance on bil UEs   Gait velocity  1.46 ft/sec      3 Minute Walk- Baseline   BP (mmHg)  112/58    HR (bpm)  72    02 Sat (%RA)  95 %    Modified Borg Scale for Dyspnea  0- Nothing at all    Perceived Rate of Exertion (Borg)  11- Fairly light      3 Minute walk- Post Test   6 Minute Walk Post Test  yes    BP (mmHg)  122/58    HR (bpm)  96   decr to 77 in <60 seconds   02 Sat (%RA)  95 %    Modified Borg Scale for Dyspnea  2- Mild shortness of breath    Perceived Rate of Exertion (Borg)  14-      3 minute walk test results    Aerobic Endurance Distance Walked  263    Endurance additional comments  3 min walk test--no norms for age found; norm for 13-82 yo male for 6 MWT 1368 ft                Objective  measurements completed on examination: See above findings.              PT Education - 06/02/18 2128    Education Details  results of PT eval and comparison to when seen last in October 2019; PT POC    Person(s) Educated  Patient;Spouse    Methods  Explanation    Comprehension  Verbalized understanding       PT Short Term Goals - 06/03/18 0645      PT SHORT TERM GOAL #1   Title  Patient will complete HEP with supervision (as needed for technique) Target all STGs 07/02/2018    Time  4    Period   Weeks    Status  New    Target Date  07/02/18      PT SHORT TERM GOAL #2   Title  The patient will improve gait speed from 1.46 ft/sec to > or equal to 1.81 ft/sec to indicate lesser fall risk.    Time  4    Period  Weeks    Status  New      PT SHORT TERM GOAL #3   Title  Pateint will improve 5xSTS time to <25 sec (using bil UEs as on evaluation)    Time  4    Period  Weeks    Status  New      PT SHORT TERM GOAL #4   Title  Patient will complete 6 MWT with vital signs stable and distance >=500 ft    Period  Weeks    Status  New        PT Long Term Goals - 06/03/18 9381      PT LONG TERM GOAL #1   Title  The patient will return demo progression of HEP and verbalize plan for ongoing wellness plan.  (LTGs due date: 08/01/2018)    Target Date  08/01/18      PT LONG TERM GOAL #2   Title  Patient will improve gait velocity to >= 2.4 ft/sec with rollator, indicating lesser fall risk.     Time  8    Period  Weeks    Status  New      PT LONG TERM GOAL #3   Title  The patient will perform 5 time sit<>stand without UE support in < or equal to 20 seconds.    Time  8    Period  Weeks    Status  New      PT LONG TERM GOAL #4   Title  Patient will improve 6 MWT distance (measured at STGs) by 190 ft    Time  8    Period  Weeks    Status  New             Plan - 06/02/18 2131    Clinical Impression Statement  Patient referred to [facility] after hospitalization 05/21/18 to 05/24/18 for pneumonia. Patient is familiar to this clinic as he underwent OPPT here 10/20/17 to 11/18/17 (after fall in January required hospitalzation, SNF and then HHPT). Second episode of PT 01/18/18 to 04/07/18 (after hospitalized for pneumonia) with patient discharged from PT. At end of second episode of care pt was continuing to ambulate with rollator. Patient now s/p another hospitalization for pneumonia with assessment indicative of signifcant decline in mobility. (04/07/18 completed 5 x STS in 17.4 sec and  today required 32.1 sec with >12 sec indicative of incr fall risk). Patient with decr endurance with HR increasing 72  bpm to 96 bpm with walking 263 ft in 3 minutes. Patient was ambulating with gait velocity 2.52 ft/sec prior to last discharge, and today gait velocity is 1.46 ft/sec (<1.81 ft/sec indicates high fall risk). Patient can benefit from OPPT to address the deficits listed below via the interventions listed below.     History and Personal Factors relevant to plan of care:  PMH-fall Jan coincided with sudden R hearing loss   Left THA, right THA 2008, CHF, MI, DM2, HTN, OA, prostate CA, shingles, A-fib,  Personal factors-age    Clinical Presentation  Stable    Clinical Decision Making  Low    Rehab Potential  Good    PT Frequency  2x / week    PT Duration  8 weeks    PT Treatment/Interventions  ADLs/Self Care Home Management;DME Instruction;Gait training;Stair training;Functional mobility training;Therapeutic exercise;Therapeutic activities;Balance training;Neuromuscular re-education;Patient/family education;Vestibular;Canalith Repostioning    PT Next Visit Plan  initiate HEP for balance and walking program for endurance (start with prior HEP and update)    PT Home Exercise Plan  [phone] (from prior episode of care)    Consulted and Agree with Plan of Care  Patient;Family member/caregiver    Family Member Consulted  spouse, Opal Sidles       Patient will benefit from skilled therapeutic intervention in order to improve the following deficits and impairments:  Abnormal gait, Cardiopulmonary status limiting activity, Decreased activity tolerance, Decreased balance, Decreased endurance, Decreased knowledge of use of DME, Decreased mobility, Difficulty walking, Decreased strength, Impaired flexibility, Impaired perceived functional ability, Obesity, Postural dysfunction, Pain  Visit Diagnosis: Other abnormalities of gait and mobility - Plan: PT plan of care cert/re-cert  Unsteadiness on feet -  Plan: PT plan of care cert/re-cert  Muscle weakness (generalized) - Plan: PT plan of care cert/re-cert  Abnormal posture - Plan: PT plan of care cert/re-cert     Problem List Patient Active Problem List   Diagnosis Date Noted  . CAD (coronary artery disease) 05/21/2018  . Asthma exacerbation 05/21/2018  . Aspiration pneumonia ([place]) 05/21/2018  . Elevated INR   . Shoulder strain, right, initial encounter   . Acute respiratory failure with hypoxia ([place]) 05/20/2018  . Encounter for therapeutic drug monitoring 12/20/2017  . S/P thoracotomy 11/29/2017  . Sepsis ([place])   . Hypoxemia   . Pneumonia of left lower lobe due to infectious organism ([place])   . Mediastinal lymphadenopathy   . Chronic cough 11/23/2017  . CLL (chronic lymphocytic leukemia) ([place]) 11/23/2017  . Pleural effusion 11/23/2017  . HCAP (healthcare-associated pneumonia) 11/20/2017  . Anemia 11/20/2017  . Ischemic cardiomyopathy 09/01/2017  . Right hip pain   . Acute otitis media   . Chronic systolic CHF (congestive heart failure) ([place])   . Pressure injury of skin 07/20/2017  . Malignant otitis externa 07/19/2017  . Fall at home, initial encounter 07/19/2017  . S/P left THA, AA 05/12/2016  . LPRD (laryngopharyngeal reflux disease) 05/21/2015  . Other allergic rhinitis 05/21/2015  . Obesity 12/12/2014  . Upper airway cough syndrome 12/11/2014  . Cough variant asthma 07/27/2014  . Wheezing 06/20/2014  . Symptomatic bradycardia - s/p Biotronik (serial number [id number]) 02/08/2014  . Diabetes mellitus type 2, noninsulin dependent ([place])   . Edema of foot 03/06/2013  . Traumatic ecchymosis of right foot 03/06/2013  . Bone spur 12/30/2012  . Pain of toe of left foot 12/30/2012  . Hyperlipidemia 07/13/2012  . HTN (hypertension) 04/12/2012  . Sick sinus syndrome ([place]) 08/17/2011  . PAF (paroxysmal  atrial fibrillation) ([place]) 09/16/2010    Rexanne Mano, PT 06/03/2018, 1:55 PM  [facility] [address] [place], [name], [address] Phone: [phone]   Fax:  [phone]  Name: Oscar Oscar MRN: [id number] Date of Birth: 06/08/1933

## 2018-06-04 ENCOUNTER — Encounter (HOSPITAL_BASED_OUTPATIENT_CLINIC_OR_DEPARTMENT_OTHER): Payer: Self-pay | Admitting: Emergency Medicine

## 2018-06-04 ENCOUNTER — Emergency Department (HOSPITAL_BASED_OUTPATIENT_CLINIC_OR_DEPARTMENT_OTHER)
Admission: EM | Admit: 2018-06-04 | Discharge: 2018-06-04 | Disposition: A | Discharge status: Home / Self Care | Payer: Medicare Other | Attending: Emergency Medicine | Admitting: Emergency Medicine

## 2018-06-04 ENCOUNTER — Other Ambulatory Visit: Payer: Self-pay

## 2018-06-04 DIAGNOSIS — M25511 Pain in right shoulder: Secondary | ICD-10-CM | POA: Diagnosis not present

## 2018-06-04 DIAGNOSIS — Z7901 Long term (current) use of anticoagulants: Secondary | ICD-10-CM | POA: Diagnosis not present

## 2018-06-04 DIAGNOSIS — I110 Hypertensive heart disease with heart failure: Secondary | ICD-10-CM | POA: Insufficient documentation

## 2018-06-04 DIAGNOSIS — Z7984 Long term (current) use of oral hypoglycemic drugs: Secondary | ICD-10-CM | POA: Insufficient documentation

## 2018-06-04 DIAGNOSIS — I5022 Chronic systolic (congestive) heart failure: Secondary | ICD-10-CM | POA: Insufficient documentation

## 2018-06-04 DIAGNOSIS — J45909 Unspecified asthma, uncomplicated: Secondary | ICD-10-CM | POA: Diagnosis not present

## 2018-06-04 DIAGNOSIS — Z87891 Personal history of nicotine dependence: Secondary | ICD-10-CM | POA: Insufficient documentation

## 2018-06-04 DIAGNOSIS — E119 Type 2 diabetes mellitus without complications: Secondary | ICD-10-CM | POA: Insufficient documentation

## 2018-06-04 DIAGNOSIS — Z79899 Other long term (current) drug therapy: Secondary | ICD-10-CM | POA: Diagnosis not present

## 2018-06-04 DIAGNOSIS — Z856 Personal history of leukemia: Secondary | ICD-10-CM | POA: Diagnosis not present

## 2018-06-04 MED ORDER — HYDROCODONE-ACETAMINOPHEN 5-325 MG PO TABS: 1.0000 | ORAL_TABLET | ORAL | 0 refills | Status: DC | PRN

## 2018-06-04 NOTE — ED Provider Notes (Signed)
[facility] EMERGENCY DEPARTMENT Provider Note   CSN: [id number] Arrival date & time: 06/04/18  8657     History   Chief Complaint Chief Complaint  Patient presents with  . Shoulder Pain    HPI CORNELLIUS Baker is a 84 y.o. male.  Patient is a 84 year old male who presents with right shoulder pain.  He states that at Thanksgiving he injured his right shoulder when he was lifting a heavy [place].  He has had ongoing pain since that time.  He was seen in the emergency department shortly after that and had an x-ray which shows some degenerative changes but no acute bony abnormality.  At that time he was admitted for pneumonia and no further evaluation of the shoulder was done.  He has been taking Tylenol for the pain.  He has ongoing pain with movement of the shoulder, primarily abduction after about 45 degrees.  He has not followed up with his PCP regarding this.     Past Medical History:  Diagnosis Date  . Asthma 1950's   history of  . Atrial fibrillation ([place])   . Bilateral carpal tunnel syndrome   . Bilateral lower extremity edema   . Bladder tumor   . Chronic systolic heart failure (HCC)    Echo 1/19: Mild LVH, EF 45-50, inf HK, MAC, severe LAE, severe RAE // Echo 7/15: Mild LVH, mod focal basal sept hypertrophy, EF 55-60, AV peak and mean 16/9, trivial MR, mod LAE, PASP 38  . CLL (chronic lymphocytic leukemia) [facility]) oncologist-  dr Ilene Qua--   dx [phone] ;  Lymphocytosis, CLL - per lov note 05-11-2017 currently under active survillance,  CT 04-17-2014 show very small lymphadenopathy, no indication for treatment  . Coronary artery disease    cardiologist-  dr Cathie Olden--  08-18-2017 Intermittant risk nuclear study w/ large area of inferior infartion with no evidence ishcemia   . 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 Diabetes mellitus type 2, noninsulin dependent ([place])   . Elevated PSA    since prostatectomy but now resolved  . Hematuria 04/2017  . History of ear infection    Right  . History of MI (myocardial infarction)    per myoview nuclear study 08-18-2017 , unknown when  . History of shingles 08/2017   L ear and scalp, possible  . Hyperlipidemia   . Hypertension   . Ischemic cardiomyopathy 09/01/2017   Presumed +CAD with Nuclear stress test 08/18/17 - Inferior scar, no ischemia, intermediate risk // med management unless +angina or worse dyspnea  . OA (osteoarthritis)   . Pacemaker 02/08/2014   followed by dr g. taylor--  single chamber Biotronik due to SSS  . Permanent atrial fibrillation   . Pneumonia 2019   Left lung  . Prostate cancer [facility]) urologist-  dr Diona Fanti   dx 2004--  Gleason 8, PSA 10.45--  11-28-2002  s/p  radical prostatectomy;  recurrent w/ increasing PSA, started ADT treatment  . RBBB (right bundle branch block)   . Sick sinus syndrome ([place])    a-Flutter with episodes of bradycardia; S/P Biotronik (serial number [id number]) 02-08-2014  . Urinary incontinence   . Wears hearing aid in right ear    receiver and transmitter    Patient Active Problem List   Diagnosis Date Noted  . CAD (coronary artery disease) 05/21/2018  . Asthma exacerbation 05/21/2018  . Aspiration pneumonia ([place]) 05/21/2018  . Elevated INR   . Shoulder strain, right, initial encounter   . Acute respiratory failure  with hypoxia ([place]) 05/20/2018  . Encounter for therapeutic drug monitoring 12/20/2017  . S/P thoracotomy 11/29/2017  . Sepsis ([place])   . Hypoxemia   . Pneumonia of left lower lobe due to infectious organism ([place])   . Mediastinal lymphadenopathy   . Chronic cough 11/23/2017  . CLL (chronic lymphocytic leukemia) ([place]) 11/23/2017  . Pleural effusion 11/23/2017  . HCAP (healthcare-associated pneumonia) 11/20/2017  . Anemia 11/20/2017  . Ischemic cardiomyopathy 09/01/2017  . Right hip pain   . Acute otitis media   . Chronic systolic CHF (congestive heart failure) ([place])   . Pressure injury of skin 07/20/2017  . Malignant otitis externa 07/19/2017  .  Fall at home, initial encounter 07/19/2017  . S/P left THA, AA 05/12/2016  . LPRD (laryngopharyngeal reflux disease) 05/21/2015  . Other allergic rhinitis 05/21/2015  . Obesity 12/12/2014  . Upper airway cough syndrome 12/11/2014  . Cough variant asthma 07/27/2014  . Wheezing 06/20/2014  . Symptomatic bradycardia - s/p Biotronik (serial number [id number]) 02/08/2014  . Diabetes mellitus type 2, noninsulin dependent ([place])   . Edema of foot 03/06/2013  . Traumatic ecchymosis of right foot 03/06/2013  . Bone spur 12/30/2012  . Pain of toe of left foot 12/30/2012  . Hyperlipidemia 07/13/2012  . HTN (hypertension) 04/12/2012  . Sick sinus syndrome ([place]) 08/17/2011  . PAF (paroxysmal atrial fibrillation) ([place]) 09/16/2010    Past Surgical History:  Procedure Laterality Date  . APPENDECTOMY    . BACK SURGERY     disk  . CARDIAC CATHETERIZATION  09-03-1999  dr Cathie Olden   abnormal cardiolite study:  minor luminal irregularities but no critial coronary artery stenosis  . CARDIOVASCULAR STRESS TEST  08-18-2017  dr Cathie Olden   Intermediate risk nuclear study w/ large area inferior infarction, no evidence of ishcemia (consistant w/ prior MI)/  study not gated due to frequent PVCs  . CARPAL TUNNEL RELEASE Right 2000  . CARPAL TUNNEL RELEASE Left 11/19/2009  . CATARACT EXTRACTION Right 07/2015  . CATARACT EXTRACTION Left 09/2015  . CYSTOSCOPY N/A 11/04/2017   Procedure: CYSTOSCOPY AND CAUTERIZATION OF BLADDER;  Surgeon: Franchot Gallo, MD;  Location: [facility];  Service: Urology;  Laterality: N/A;  . INSERTION PENILE PROSTHESIS  02-22-2004    dr Mattie Marlin  [facility]  . IR THORACENTESIS ASP PLEURAL SPACE W/IMG GUIDE  11/26/2017  . KNEE ARTHROSCOPY Left 07/2010  . PERMANENT PACEMAKER INSERTION N/A 02/08/2014   Procedure: PERMANENT PACEMAKER INSERTION;  Surgeon: Evans Lance, MD;  Location: [facility] CATH LAB;  Service: Cardiovascular;  Laterality: N/A;  . PLEURAL EFFUSION DRAINAGE Left 11/30/2017    Procedure: DRAINAGE OF HEMOTHORAX;  Surgeon: Ivin Poot, MD;  Location: [facility];  Service: Thoracic;  Laterality: Left;  . RADICAL RETROPUBIC PROSTATECTOMY W/ BILATERAL PELVIC LYMPH NODE DISSECTION  11-28-2002   dr Mattie Marlin  [facility]  . TONSILLECTOMY    . TOTAL HIP ARTHROPLASTY Left 05/12/2016   Procedure: LEFT TOTAL HIP ARTHROPLASTY ANTERIOR APPROACH;  Surgeon: Paralee Cancel, MD;  Location: [facility];  Service: Orthopedics;  Laterality: Left;  . TOTAL HIP ARTHROPLASTY Right 07-15-2006   dr Alvan Dame  [facility]  . TRANSTHORACIC ECHOCARDIOGRAM  07/20/2017   mild LVH, ef 45-50%, hypokinesis of the basal-midinferior myocardium, due to AFib unable to evaluate diastolic function/  severe LAE and RAE/  trivial PR and TR  . VIDEO ASSISTED THORACOSCOPY Left 11/30/2017   Procedure: VIDEO ASSISTED THORACOSCOPY;  Surgeon: Ivin Poot, MD;  Location: [facility];  Service: Thoracic;  Laterality:  Left;  . VIDEO ASSISTED THORACOSCOPY (VATS)/EMPYEMA Left 11/29/2017   Procedure: VIDEO ASSISTED THORACOSCOPY (VATS)/EMPYEMA;  Surgeon: Ivin Poot, MD;  Location: [facility];  Service: Thoracic;  Laterality: Left;        Home Medications    Prior to Admission medications   Medication Sig Start Date End Date Taking? Authorizing Provider  acetaminophen (TYLENOL) 500 MG tablet Take 2 tablets (1,000 mg total) by mouth every 6 (six) hours. 12/05/17   Elgie Collard, PA-C  atorvastatin (LIPITOR) 20 MG tablet TAKE ONE TABLET EACH DAY AT 6PM Patient taking differently: Take 20 mg by mouth daily at 6 PM.  03/07/18   Nahser, Wonda Cheng, MD  carvedilol (COREG) 3.125 MG tablet Take 1 tablet (3.125 mg total) by mouth 2 (two) times daily. 08/20/17   Richardson Dopp T, PA-C  fenofibrate micronized (LOFIBRA) 200 MG capsule TAKE 1 CAPSULE BY MOUTH DAILY BEFORE BREAKFAST Patient taking differently: Take 200 mg by mouth daily before breakfast.  06/29/17   Nahser, Wonda Cheng, MD  furosemide (LASIX) 20 MG tablet Take 1 tablet (20 mg total) by mouth daily.  01/19/18   Richardson Dopp T, PA-C  glipiZIDE (GLUCOTROL XL) 2.5 MG 24 hr tablet Take 2.5 mg by mouth daily with breakfast.    [provider]  guaiFENesin (MUCINEX) 600 MG 12 hr tablet Take 600 mg by mouth as needed.     [provider]  HYDROcodone-acetaminophen (NORCO/VICODIN) 5-325 MG tablet Take 1 tablet by mouth every 4 (four) hours as needed. 06/04/18   Malvin Johns, MD  levofloxacin (LEVAQUIN) 750 MG tablet Take 1 tablet (750 mg total) by mouth daily at 6 PM. 05/24/18   Gherghe, Vella Redhead, MD  loratadine (CLARITIN) 10 MG tablet Take 1 tablet (10 mg total) by mouth daily. 05/09/18   Fenton Foy, NP  metFORMIN (GLUCOPHAGE) 1000 MG tablet Take 1,000 mg by mouth 2 (two) times daily with a meal.      [provider]  multivitamin [facility]) per tablet Take 1 tablet by mouth daily. Will stop prior to procedure    [provider]  potassium chloride (K-DUR) 10 MEQ tablet Take 1 tablet (10 mEq total) by mouth daily. 01/19/18   Richardson Dopp T, PA-C  sitaGLIPtin (JANUVIA) 100 MG tablet Take 100 mg by mouth daily.    [provider]  vitamin B-12 (CYANOCOBALAMIN) 1000 MCG tablet Take 1,000 mcg by mouth daily. Will stop prior to procedure    [provider]  warfarin (COUMADIN) 5 MG tablet TAKE AS DIRECTED BY COUMADIN CLINIC Patient taking differently: Take 5 mg by mouth See admin instructions. Take 89m on everyday except on saturdays take 7.562m 02/01/18   Nahser, PhWonda ChengMD    Family History Family History  Problem Relation Age of Onset  . Emphysema Mother   . Allergic rhinitis Mother   . Heart attack Father   . Allergic rhinitis Brother   . Pulmonary fibrosis Brother     Social History Social History   Tobacco Use  . Smoking status: Former Smoker    Years: 25.00    Types: Pipe, Cigars    Last attempt to quit: 11/25/1975    Years since quitting: 42.5  . Smokeless tobacco: Never Used  Substance Use Topics  . Alcohol use: Yes     Alcohol/week: 0.0 standard drinks    Comment: occasional  . Drug use: No     Allergies   Altace [ramipril]; Augmentin [amoxicillin-pot clavulanate]; Codeine; and Simvastatin  Review of Systems Review of Systems  Constitutional: Negative for fever.  Gastrointestinal: Negative for nausea and vomiting.  Musculoskeletal: Positive for arthralgias. Negative for back pain, joint swelling and neck pain.  Skin: Negative for wound.  Neurological: Negative for weakness, numbness and headaches.     Physical Exam Updated Vital Signs BP 115/69 (BP Location: Left Arm)   Pulse 74   Temp 98.3 F (36.8 C) (Oral)   Resp 18   Ht _0  (1.778 m)   Wt 95 kg   SpO2 96%   BMI 30.05 kg/m   Physical Exam Constitutional:      Appearance: He is well-developed.  HENT:     Head: Normocephalic and atraumatic.  Neck:     Musculoskeletal: Normal range of motion and neck supple.  Cardiovascular:     Rate and Rhythm: Normal rate.  Pulmonary:     Effort: Pulmonary effort is normal.  Musculoskeletal:        General: Tenderness present.     Comments: Patient has tenderness to the anterior shoulder, primarily on abduction of the shoulder, no significant pain on external rotation.  He has normal sensation and motor function distally.  Radial pulses are intact.  No swelling or deformity is noted.  Skin:    General: Skin is warm and dry.  Neurological:     Mental Status: He is alert and oriented to person, place, and time.      ED Treatments / Results  Labs (all labs ordered are listed, but only abnormal results are displayed) Labs Reviewed - No data to display  EKG None  Radiology No results found.  Procedures Procedures (including critical care time)  Medications Ordered in ED Medications - No data to display   Initial Impression / Assessment and Plan / ED Course  I have reviewed the triage vital signs and the nursing notes.  Pertinent labs & imaging results that were available  during my care of the patient were reviewed by me and considered in my medical decision making (see chart for details).     Patient with right shoulder pain.  He has had recent x-rays which were negative for acute abnormality.  He was started on a short course of Vicodin for pain.  He was advised not to use Tylenol with this medicine and to take a stool softener.  He was advised to follow-up with his orthopedist who is Dr. Alvan Dame.  Final Clinical Impressions(s) / ED Diagnoses   Final diagnoses:  Acute pain of right shoulder    ED Discharge Orders         Ordered    HYDROcodone-acetaminophen (NORCO/VICODIN) 5-325 MG tablet  Every 4 hours PRN     06/04/18 0958           Malvin Johns, MD 06/04/18 1002

## 2018-06-04 NOTE — ED Provider Notes (Signed)
Patient's pharmacy called in their electronic prescription system is not working.  I will give him a written prescription for the hydrocodone and the pharmacist said that he would cancel the electronic prescription when it eventually came through.   Oscar Johns, MD 06/04/18 1158

## 2018-06-04 NOTE — ED Triage Notes (Signed)
R shoulder pain x several weeks after picking up a [place] around thanksgiving. He was seen here then with neg xray. Pain persists.

## 2018-06-06 ENCOUNTER — Ambulatory Visit (INDEPENDENT_AMBULATORY_CARE_PROVIDER_SITE_OTHER): Payer: Medicare Other | Admitting: Cardiovascular Disease

## 2018-06-06 ENCOUNTER — Ambulatory Visit (INDEPENDENT_AMBULATORY_CARE_PROVIDER_SITE_OTHER): Payer: Medicare Other | Admitting: *Deleted

## 2018-06-06 ENCOUNTER — Encounter: Payer: Self-pay | Admitting: Cardiovascular Disease

## 2018-06-06 VITALS — BP 124/74 | HR 66 | Ht 70.0 in | Wt 202.8 lb

## 2018-06-06 DIAGNOSIS — I4821 Permanent atrial fibrillation: Secondary | ICD-10-CM

## 2018-06-06 DIAGNOSIS — I480 Paroxysmal atrial fibrillation: Secondary | ICD-10-CM

## 2018-06-06 DIAGNOSIS — Z5181 Encounter for therapeutic drug level monitoring: Secondary | ICD-10-CM

## 2018-06-06 DIAGNOSIS — I10 Essential (primary) hypertension: Secondary | ICD-10-CM | POA: Diagnosis not present

## 2018-06-06 DIAGNOSIS — I255 Ischemic cardiomyopathy: Secondary | ICD-10-CM

## 2018-06-06 LAB — POCT INR: INR: 1.1 — AB (ref 2.0–3.0)

## 2018-06-06 NOTE — Patient Instructions (Signed)
Description   Today and tomorrow take 1.5 tablets, then resume 1 tablet daily except 1.5 tablets on Saturdays. Recheck in one week.  Seek immediate medical attention for any unusual bleeding or bruising and any falls/injuries.  Call [facility] [phone] with any concerns.

## 2018-06-06 NOTE — Patient Instructions (Addendum)
Medication Instructions:  Your physician recommends that you continue on your current medications as directed. Please refer to the Current Medication list given to you today.  If you need a refill on your cardiac medications before your next appointment, please call your pharmacy.   Lab work: None Ordered   Testing/Procedures: None Ordered   Follow-Up: At [facility], you and your health needs are our priority.  As part of our continuing mission to provide you with exceptional heart care, we have created designated Provider Care Teams.  These Care Teams include your primary Cardiologist (physician) and Advanced Practice Providers (APPs -  Physician Assistants and Nurse Practitioners) who all work together to provide you with the care you need, when you need it. You will need a follow up appointment in:  1 years.  Please call our office 2 months in advance to schedule this appointment.  You may see [name], MD or one of the following Advanced Practice Providers on your designated Care Team: Scott Weaver, PA-C Vin Bhagat, PA-C . Janine Hammond, NP   For your  leg edema you  should do  the following 1. Leg elevation - I recommend the Lounge Dr. Leg rest.  See below for details  2. Salt restriction  -  Use potassium chloride instead of regular salt as a salt substitute. 3. Walk regularly 4. Compression hose - [facility] [employer] 5. Weight loss    Available on [email or web address] Or  Go to [email or web address]      

## 2018-06-06 NOTE — Progress Notes (Signed)
Oscar Baker Date of Birth  11-05-1932 [place] [address]    [place]   [place], [place]  [address]    [place], [place]  [address] [phone]  Fax  [phone]  [phone]  Fax [phone]  Problem list: 1. Sick sinus syndrome with episodes of atrial flutter, frequent episodes of bradycardia - s/p pacer Aug. 2015.  CHADS2Vasc = 4 ( age 84, HTN ,DM)  2. Hypertension 3. Dyslipidemia 4. Diabetes mellitus 5. Arthroscopic knee surgery     Oscar Baker is doing well.  He has asymptomatic bradycardia that we have followed since July 2000.  He has never had any syncope.  He had left knee surgery a year ago and right knee surgery in Dec. 2012.  No cardiac complaints.    He has done well since I last saw him.  He has bad knees and has not been walking much - he tries to avoid salt as much as possible.  He denies any chest pain, shortness breath, or syncope.  July 13, 2012 Oscar Baker is doing well.  No syncope or presyncope.  [name]    January 16, 2013:  Oscar Baker is doing well.  His HR continues to be slow.  He is asymptomatic - no syncope or presyncope.  Feb. 24, 2015:  Oscar Baker is doing well.  No syncope. No pre-syncope.  Getting his coumadin checked monthly.  No cp or dyspnea.  Had a URI this winter. We walked around the office and he increased his HR to 63.  He is able to get his heart rate up into the 70s when he is working out at [employer].  December 19, 2013:  Oscar Baker is here as a work in for fatigue and slow HR.    He has not had any episodes of syncope.   No chest pain , no dyspnea .  Dec. 30, 2015:  Oscar Baker is a 84 yo with hx of bradycardia and pacer,   placement, HTN, DM, -   No CP or dyspnea. Has had URI,  Has been on several courses of Abx.  I suggested Zyrtec or claritan   Dec. 6, 2016:  Doing well .  No CP , no dyspnea.   Has had a chronic sinus  drainage.  ? Allergies, ? GERD    Dec. 12, 2017:  Doing well,     Seen with wife , Opal Sidles  Left  hip replacement 3 weeks ago .   Alvan Dame)  No cardiac complaints.     Dec. 12, 2018:  Prostate cancer has returned.  Sees Dr. Diona Fanti.   Has an indwelling catheter at this point.  No cardiac issues ,  Is moving - has lost his meds.  Has not had amlodipine in 3 weeks.  BP is still normal   Dec. 16, 2019:  Oscar Baker is seen today for follow-up of his atrial fibrillation, hypertension, hyperlipidemia. Rough year, has been in the hospital 3 times Has had VATS surgery on his lung ( for a bacterial infection)  HAS chronic aspiration , coughs when he eats   No cardiac standpont    Current Outpatient Medications on File Prior to Visit  Medication Sig Dispense Refill  . acetaminophen (TYLENOL) 500 MG tablet Take 2 tablets (1,000 mg total) by mouth every 6 (six) hours. 30 tablet 0  . atorvastatin (LIPITOR) 20 MG  tablet TAKE ONE TABLET EACH DAY AT 6PM 90 tablet 3  . carvedilol (COREG) 3.125 MG tablet Take 1 tablet (3.125 mg total) by mouth 2 (two) times daily. 180 tablet 3  . fenofibrate micronized (LOFIBRA) 200 MG capsule TAKE 1 CAPSULE BY MOUTH DAILY BEFORE BREAKFAST 90 capsule 3  . furosemide (LASIX) 20 MG tablet Take 20 mg by mouth daily as needed for fluid or edema.    [name] glipiZIDE (GLUCOTROL XL) 2.5 MG 24 hr tablet Take 2.5 mg by mouth daily with breakfast.    . guaiFENesin (MUCINEX) 600 MG 12 hr tablet Take 600 mg by mouth as needed.     [name] HYDROcodone-acetaminophen (NORCO/VICODIN) 5-325 MG tablet Take 1 tablet by mouth every 4 (four) hours as needed. 15 tablet 0  . loratadine (CLARITIN) 10 MG tablet Take 1 tablet (10 mg total) by mouth daily. 30 tablet 11  . metFORMIN (GLUCOPHAGE) 1000 MG tablet Take 1,000 mg by mouth 2 (two) times daily with a meal.      . multivitamin (THERAGRAN) per tablet Take 1 tablet by mouth daily. Will stop prior to procedure    . sitaGLIPtin (JANUVIA) 100 MG tablet  Take 100 mg by mouth daily.    . vitamin B-12 (CYANOCOBALAMIN) 1000 MCG tablet Take 1,000 mcg by mouth daily. Will stop prior to procedure    . warfarin (COUMADIN) 5 MG tablet TAKE AS DIRECTED BY COUMADIN CLINIC (Patient taking differently: Take 5 mg by mouth See admin instructions. Take 36m on everyday except on saturdays take 7.563m) 40 tablet 3   No current facility-administered medications on file prior to visit.     Allergies  Allergen Reactions  . Altace [Ramipril] Other (See Comments)    "throat felt like had a knot in it"  . Augmentin [Amoxicillin-Pot Clavulanate] Diarrhea    severe  . Codeine Nausea And Vomiting    Nausea and vomiting   . Simvastatin Other (See Comments)    Leg aches    Past Medical History:  Diagnosis Date  . Asthma 1950's   history of  . Atrial fibrillation ([facility]  . Bilateral carpal tunnel syndrome   . Bilateral lower extremity edema   . Bladder tumor   . Chronic systolic heart failure (HCC)    Echo 1/19: Mild LVH, EF 45-50, inf HK, MAC, severe LAE, severe RAE // Echo 7/15: Mild LVH, mod focal basal sept hypertrophy, EF 55-60, AV peak and mean 16/9, trivial MR, mod LAE, PASP 38  . [facility]) [facility]  [facility]ene Qua   dx [phone]  Lymphocytosis, CLL - per lov note 05-11-2017 currently under active survillance,  CT 04-17-2014 show very small lymphadenopathy, no indication for treatment  . Coronary artery disease    cardiologist-  dr naCathie Olden  08-18-2017 Intermittant risk nuclear study w/ large area of inferior infartion with no evidence ishcemia   . 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 Diabetes mellitus type 2, noninsulin dependent (HCM[place]  . Elevated PSA    since prostatectomy but now resolved  . Hematuria 04/2017  . History of ear infection    Right  . History of MI (myocardial infarction)    per myoview nuclear study 08-18-2017 , unknown when  . History of shingles 08/2017   L ear and scalp, possible  . Hyperlipidemia   .  Hypertension   . Ischemic cardiomyopathy 09/01/2017   Presumed +CAD with Nuclear stress test 08/18/17 - Inferior scar, no ischemia, intermediate risk // med  management unless +angina or worse dyspnea  . OA (osteoarthritis)   . Pacemaker 02/08/2014   followed by dr g. taylor--  single chamber Biotronik due to SSS  . Permanent atrial fibrillation   . Pneumonia 2019   Left lung  . Prostate cancer [facility]) urologist-  dr Diona Fanti   dx 2004--  Gleason 8, PSA 10.45--  11-28-2002  s/p  radical prostatectomy;  recurrent w/ increasing PSA, started ADT treatment  . RBBB (right bundle branch block)   . Sick sinus syndrome ([place])    a-Flutter with episodes of bradycardia; S/P Biotronik (serial number [id number]) 02-08-2014  . Urinary incontinence   . Wears hearing aid in right ear    receiver and transmitter    Past Surgical History:  Procedure Laterality Date  . APPENDECTOMY    . BACK SURGERY     disk  . CARDIAC CATHETERIZATION  09-03-1999  dr Cathie Olden   abnormal cardiolite study:  minor luminal irregularities but no critial coronary artery stenosis  . CARDIOVASCULAR STRESS TEST  08-18-2017  dr Cathie Olden   Intermediate risk nuclear study w/ large area inferior infarction, no evidence of ishcemia (consistant w/ prior MI)/  study not gated due to frequent PVCs  . CARPAL TUNNEL RELEASE Right 2000  . CARPAL TUNNEL RELEASE Left 11/19/2009  . CATARACT EXTRACTION Right 07/2015  . CATARACT EXTRACTION Left 09/2015  . CYSTOSCOPY N/A 11/04/2017   Procedure: CYSTOSCOPY AND CAUTERIZATION OF BLADDER;  Surgeon: Franchot Gallo, MD;  Location: [facility];  Service: Urology;  Laterality: N/A;  . INSERTION PENILE PROSTHESIS  02-22-2004    dr Mattie Marlin  [name]  . IR THORACENTESIS ASP PLEURAL SPACE W/IMG GUIDE  11/26/2017  . KNEE ARTHROSCOPY Left 07/2010  . PERMANENT PACEMAKER INSERTION N/A 02/08/2014   Procedure: PERMANENT PACEMAKER INSERTION;  Surgeon: Evans Lance, MD;  Location: [facility] CATH LAB;  Service:  Cardiovascular;  Laterality: N/A;  . PLEURAL EFFUSION DRAINAGE Left 11/30/2017   Procedure: DRAINAGE OF HEMOTHORAX;  Surgeon: Ivin Poot, MD;  Location: [place];  Service: Thoracic;  Laterality: Left;  . RADICAL RETROPUBIC PROSTATECTOMY W/ BILATERAL PELVIC LYMPH NODE DISSECTION  11-28-2002   dr Mattie Marlin  [facility]  . TONSILLECTOMY    . TOTAL HIP ARTHROPLASTY Left 05/12/2016   Procedure: LEFT TOTAL HIP ARTHROPLASTY ANTERIOR APPROACH;  Surgeon: Paralee Cancel, MD;  Location: [facility];  Service: Orthopedics;  Laterality: Left;  . TOTAL HIP ARTHROPLASTY Right 07-15-2006   dr Alvan Dame  [facility]  . TRANSTHORACIC ECHOCARDIOGRAM  07/20/2017   mild LVH, ef 45-50%, hypokinesis of the basal-midinferior myocardium, due to AFib unable to evaluate diastolic function/  severe LAE and RAE/  trivial PR and TR  . VIDEO ASSISTED THORACOSCOPY Left 11/30/2017   Procedure: VIDEO ASSISTED THORACOSCOPY;  Surgeon: Ivin Poot, MD;  Location: [place];  Service: Thoracic;  Laterality: Left;  [name] VIDEO ASSISTED THORACOSCOPY (VATS)/EMPYEMA Left 11/29/2017   Procedure: VIDEO ASSISTED THORACOSCOPY (VATS)/EMPYEMA;  Surgeon: Ivin Poot, MD;  [facility];  Service: Thoracic;  Laterality: Left;    Social History   Tobacco Use  Smoking Status Former Smoker  . Years: 25.00  . Types: Pipe, Cigars  . Last attempt to quit: 11/25/1975  . Years since quitting: 42.5  Smokeless Tobacco Never Used    Social History   Substance and Sexual Activity  Alcohol Use Yes  . Alcohol/week: 0.0 standard drinks   Comment: occasional    Family History  Problem Relation Age of Onset  . Emphysema  Mother   . Allergic rhinitis Mother   . Heart attack Father   . Allergic rhinitis Brother   . Pulmonary fibrosis Brother     Reviw of Systems:  Reviewed in the HPI.  All other systems are negative.  Physical Exam: Blood pressure 124/74, pulse 66, height _0  (1.778 m), weight 202 lb 12.8 oz (92 kg), SpO2 96 %.  GEN:   Elderly man.    Chronically ill  HEENT: Normal NECK: No JVD; No carotid bruits LYMPHATICS: No lymphadenopathy CARDIAC: RRR  RESPIRATORY:    Bilateral rales and  rhonchi  , R> L  ABDOMEN: Soft, non-tender, non-distended MUSCULOSKELETAL:   Trace - 1+ leg  edema  SKIN: Warm and dry NEUROLOGIC:  Alert and oriented x 3   ECG    Assessment / Plan:   1. Atrial fib:     CHADS2Vasc = 4 ( age 87, HTN ,DM)    s/p pacer On coumadin  Seems to be stable   2. Hypertension-    BP is well controlled.    3. Dyslipidemia-  Continue meds .  4. Diabetes mellitus 5. Arthroscopic knee surgery   Mertie Moores, MD  06/06/2018 9:11 AM    [address] [place],  [place], [place]  [address] Pager [phone] Phone: [phone]; Fax: [phone]

## 2018-06-07 ENCOUNTER — Ambulatory Visit: Payer: Medicare Other | Admitting: Physical Therapy

## 2018-06-07 ENCOUNTER — Encounter: Payer: Self-pay | Admitting: Physical Therapy

## 2018-06-07 ENCOUNTER — Encounter: Payer: Self-pay | Admitting: Cardiovascular Disease

## 2018-06-07 DIAGNOSIS — R2689 Other abnormalities of gait and mobility: Secondary | ICD-10-CM

## 2018-06-07 DIAGNOSIS — M6281 Muscle weakness (generalized): Secondary | ICD-10-CM

## 2018-06-07 DIAGNOSIS — R293 Abnormal posture: Secondary | ICD-10-CM | POA: Diagnosis not present

## 2018-06-07 DIAGNOSIS — R2681 Unsteadiness on feet: Secondary | ICD-10-CM | POA: Diagnosis not present

## 2018-06-07 NOTE — Patient Instructions (Addendum)
    Every evening when you walk, your goal is to walk 10 laps.   Access Code: [email or web address]  URL: [email or web address]  Date: 06/07/2018  Prepared by: Barry Brunner   Exercises  Single Leg Stance with Support - 3 reps - 1 sets - 10-30seconds hold - 1x daily - 7x weekly

## 2018-06-07 NOTE — Therapy (Signed)
[facility] [address] [place], [name], [address] Phone: [phone]   Fax:  [phone]  Physical Therapy Treatment  Patient Details  Name: Oscar Baker MRN: [id number] Date of Birth: 1933/03/02 Referring Provider (PT): Marzetta Board, MD (hospitalist) [name], MD (PCP)   Encounter Date: 06/07/2018  PT End of Session - 06/07/18 1710    Visit Number  2    Number of Visits  17    Date for PT Re-Evaluation  07/02/18    Authorization Type  Medicare & BCBS supplement    Authorization Time Period  06/02/18 to 08/31/2018    PT Start Time  1535    PT Stop Time  1614    PT Time Calculation (min)  39 min    Equipment Utilized During Treatment  Gait belt    Activity Tolerance  Patient tolerated treatment well    Behavior During Therapy  [facility] for tasks assessed/performed       Past Medical History:  Diagnosis Date  . Asthma 1950's   history of  . Atrial fibrillation ([place])   . Bilateral carpal tunnel syndrome   . Bilateral lower extremity edema   . Bladder tumor   . Chronic systolic heart failure (HCC)    Echo 1/19: Mild LVH, EF 45-50, inf HK, MAC, severe LAE, severe RAE // Echo 7/15: Mild LVH, mod focal basal sept hypertrophy, EF 55-60, AV peak and mean 16/9, trivial MR, mod LAE, PASP 38  . CLL (chronic lymphocytic leukemia) [facility]) oncologist-  dr Ilene Qua--   dx [phone] ;  Lymphocytosis, CLL - per lov note 05-11-2017 currently under active survillance,  CT 04-17-2014 show very small lymphadenopathy, no indication for treatment  . Coronary artery disease    cardiologist-  dr Cathie Olden--  08-18-2017 Intermittant risk nuclear study w/ large area of inferior infartion with no evidence ishcemia   . 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 Diabetes mellitus type 2, noninsulin dependent ([place])   . Elevated PSA    since prostatectomy but now resolved  . Hematuria 04/2017  . History of ear infection    Right  . History of MI (myocardial  infarction)    per myoview nuclear study 08-18-2017 , unknown when  . History of shingles 08/2017   L ear and scalp, possible  . Hyperlipidemia   . Hypertension   . Ischemic cardiomyopathy 09/01/2017   Presumed +CAD with Nuclear stress test 08/18/17 - Inferior scar, no ischemia, intermediate risk // med management unless +angina or worse dyspnea  . OA (osteoarthritis)   . Pacemaker 02/08/2014   followed by dr g. taylor--  single chamber Biotronik due to SSS  . Permanent atrial fibrillation   . Pneumonia 2019   Left lung  . Prostate cancer [facility]) urologist-  dr Diona Fanti   dx 2004--  Gleason 8, PSA 10.45--  11-28-2002  s/p  radical prostatectomy;  recurrent w/ increasing PSA, started ADT treatment  . RBBB (right bundle branch block)   . Sick sinus syndrome ([place])    a-Flutter with episodes of bradycardia; S/P Biotronik (serial number [id number]) 02-08-2014  . Urinary incontinence   . Wears hearing aid in right ear    receiver and transmitter    Past Surgical History:  Procedure Laterality Date  . APPENDECTOMY    . BACK SURGERY     disk  . CARDIAC CATHETERIZATION  09-03-1999  dr Cathie Olden   abnormal cardiolite study:  minor luminal irregularities but no critial coronary artery stenosis  .  CARDIOVASCULAR STRESS TEST  08-18-2017  dr Cathie Olden   Intermediate risk nuclear study w/ large area inferior infarction, no evidence of ishcemia (consistant w/ prior MI)/  study not gated due to frequent PVCs  . CARPAL TUNNEL RELEASE Right 2000  . CARPAL TUNNEL RELEASE Left 11/19/2009  . CATARACT EXTRACTION Right 07/2015  . CATARACT EXTRACTION Left 09/2015  . CYSTOSCOPY N/A 11/04/2017   Procedure: CYSTOSCOPY AND CAUTERIZATION OF BLADDER;  Surgeon: Franchot Gallo, MD;  Location: [facility];  Service: Urology;  Laterality: N/A;  . INSERTION PENILE PROSTHESIS  02-22-2004    dr Mattie Marlin  [facility]  . IR THORACENTESIS ASP PLEURAL SPACE W/IMG GUIDE  11/26/2017  . KNEE ARTHROSCOPY Left 07/2010  .  PERMANENT PACEMAKER INSERTION N/A 02/08/2014   Procedure: PERMANENT PACEMAKER INSERTION;  Surgeon: Evans Lance, MD;  Location: [facility] CATH LAB;  Service: Cardiovascular;  Laterality: N/A;  . PLEURAL EFFUSION DRAINAGE Left 11/30/2017   Procedure: DRAINAGE OF HEMOTHORAX;  Surgeon: Ivin Poot, MD;  Location: [facility];  Service: Thoracic;  Laterality: Left;  . RADICAL RETROPUBIC PROSTATECTOMY W/ BILATERAL PELVIC LYMPH NODE DISSECTION  11-28-2002   dr Mattie Marlin  [facility]  . TONSILLECTOMY    . TOTAL HIP ARTHROPLASTY Left 05/12/2016   Procedure: LEFT TOTAL HIP ARTHROPLASTY ANTERIOR APPROACH;  Surgeon: Paralee Cancel, MD;  Location: [facility];  Service: Orthopedics;  Laterality: Left;  . TOTAL HIP ARTHROPLASTY Right 07-15-2006   dr Alvan Dame  [facility]  . TRANSTHORACIC ECHOCARDIOGRAM  07/20/2017   mild LVH, ef 45-50%, hypokinesis of the basal-midinferior myocardium, due to AFib unable to evaluate diastolic function/  severe LAE and RAE/  trivial PR and TR  . VIDEO ASSISTED THORACOSCOPY Left 11/30/2017   Procedure: VIDEO ASSISTED THORACOSCOPY;  Surgeon: Ivin Poot, MD;  Location: [facility];  Service: Thoracic;  Laterality: Left;  [name] VIDEO ASSISTED THORACOSCOPY (VATS)/EMPYEMA Left 11/29/2017   Procedure: VIDEO ASSISTED THORACOSCOPY (VATS)/EMPYEMA;  Surgeon: Ivin Poot, MD;  Location: [facility];  Service: Thoracic;  Laterality: Left;    There were no vitals filed for this visit.  Subjective Assessment - 06/07/18 1542    Subjective  Doing better. Has a hard time finding time to do his HEP. Reports he still practices walking in the hallway with his cane or with no device.     Patient is accompained by:  Family member    Pertinent History  Left THA 05/12/16, right THA 2008, CHF, MI, DM2, HTN, OA, prostate CA, shingles, A-fib, hearing loss, knee sg, recent thorascopy.  05/02/2017- bleeding and began with urinary incontinence    Limitations  Sitting;Lifting;Standing;Walking;House hold activities    Patient Stated Goals  work  on balance, "be able to walk, get off the walker (I'll live with a cane)".  Patient has used the walker since January when he fell.    Currently in Pain?  No/denies                       [facility] Adult PT Treatment/Exercise - 06/07/18 1653      Ambulation/Gait   Ambulation/Gait Assistance  4: Min assist;5: Supervision    Ambulation/Gait Assistance Details  supervision with RW due to vc for upright posture, relaxed shoulders and less weight thru his UEs; with cane pt required up to min assist as he fatigued and became less steady    Ambulation Distance (Feet)  80 Feet   120   Assistive device  Rolling walker;Straight cane    Gait  Pattern  Step-through pattern;Decreased stride length;Lateral hip instability;Trunk flexed;Wide base of support   RW too far ahead     Posture/Postural Control   Posture/Postural Control  Postural limitations    Postural Limitations  Rounded Shoulders;Forward head;Flexed trunk    Posture Comments  emphasized upright posture to put less pressure on Rt shoulder which pt reports he is having issues with and is going to see the MD about his shoulder      Knee/Hip Exercises: Standing   Heel Raises  Both;1 set;10 reps    SLS  at counter, RUE support when standing on RLE and the reverse for left; 10-30 seconds, not bracing lifted leg against the other leg; 5 reps each leg   Other Standing Knee Exercises  Toe raises bil x 10 at counter, no imbalance, good clearance of forefoot bil           Balance Exercises - 06/07/18 1655      Balance Exercises: Standing   Standing Eyes Closed  Narrow base of support (BOS);Solid surface;30 secs;3 reps   feet slightly <hip width; initial incr sway with ind recover       PT Education - 06/07/18 1708    Education Details  asked pt to commit to doing 15 minutes of HEP each day; removed exercises that pt was doing easily today and added walking program with only other ex SLS    Person(s) Educated  Patient;Spouse     Methods  Explanation;Demonstration;Tactile cues;Verbal cues;Handout    Comprehension  Verbalized understanding;Returned demonstration;Verbal cues required;Tactile cues required;Need further instruction       PT Short Term Goals - 06/03/18 0645      PT SHORT TERM GOAL #1   Title  Patient will complete HEP with supervision (as needed for technique) Target all STGs 07/02/2018    Time  4    Period  Weeks    Status  New    Target Date  07/02/18      PT SHORT TERM GOAL #2   Title  The patient will improve gait speed from 1.46 ft/sec to > or equal to 1.81 ft/sec to indicate lesser fall risk.    Time  4    Period  Weeks    Status  New      PT SHORT TERM GOAL #3   Title  Pateint will improve 5xSTS time to <25 sec (using bil UEs as on evaluation)    Time  4    Period  Weeks    Status  New      PT SHORT TERM GOAL #4   Title  Patient will complete 6 MWT with vital signs stable and distance >=500 ft    Period  Weeks    Status  New        PT Long Term Goals - 06/03/18 6962      PT LONG TERM GOAL #1   Title  The patient will return demo progression of HEP and verbalize plan for ongoing wellness plan.  (LTGs due date: 08/01/2018)    Target Date  08/01/18      PT LONG TERM GOAL #2   Title  Patient will improve gait velocity to >= 2.4 ft/sec with rollator, indicating lesser fall risk.     Time  8    Period  Weeks    Status  New      PT LONG TERM GOAL #3   Title  The patient will perform 5 time sit<>stand without UE support  in < or equal to 20 seconds.    Time  8    Period  Weeks    Status  New      PT LONG TERM GOAL #4   Title  Patient will improve 6 MWT distance (measured at STGs) by 190 ft    Time  8    Period  Weeks    Status  New            Plan - 06/07/18 1710    Clinical Impression Statement  Session focused on re-establishing a HEP that pt will commit to completing regularly. Overall, he appeared better today and tolerated ambulation with less fatigue (with RW)  compared to on eval. He did fatigue more quickly with [facility] and became more unsteady with fatigue. Patient can continue to benefit from PT to reduce his fall risk.     Rehab Potential  Good    PT Frequency  2x / week    PT Duration  8 weeks    PT Treatment/Interventions  ADLs/Self Care Home Management;DME Instruction;Gait training;Stair training;Functional mobility training;Therapeutic exercise;Therapeutic activities;Balance training;Neuromuscular re-education;Patient/family education;Vestibular;Canalith Repostioning    PT Next Visit Plan  did pt do his HEP? re-visit issue of double-vision he was experiencing in July when last seen? Does he still need VORx1 or compensatory saccades? Continue to work on balance and endurance    PT Home Exercise Plan  [phone]     Consulted and Agree with Plan of Care  Patient;Family member/caregiver    Family Member Consulted  spouse, Opal Sidles       Patient will benefit from skilled therapeutic intervention in order to improve the following deficits and impairments:  Abnormal gait, Cardiopulmonary status limiting activity, Decreased activity tolerance, Decreased balance, Decreased endurance, Decreased knowledge of use of DME, Decreased mobility, Difficulty walking, Decreased strength, Impaired flexibility, Impaired perceived functional ability, Obesity, Postural dysfunction, Pain  Visit Diagnosis: Other abnormalities of gait and mobility  Unsteadiness on feet  Muscle weakness (generalized)  Abnormal posture     Problem List Patient Active Problem List   Diagnosis Date Noted  . CAD (coronary artery disease) 05/21/2018  . Asthma exacerbation 05/21/2018  . Aspiration pneumonia ([place]) 05/21/2018  . Elevated INR   . Shoulder strain, right, initial encounter   . Acute respiratory failure with hypoxia ([place]) 05/20/2018  . Encounter for therapeutic drug monitoring 12/20/2017  . S/P thoracotomy 11/29/2017  . Sepsis ([place])   . Hypoxemia   . Pneumonia of left lower  lobe due to infectious organism ([place])   . Mediastinal lymphadenopathy   . Chronic cough 11/23/2017  . CLL (chronic lymphocytic leukemia) ([place]) 11/23/2017  . Pleural effusion 11/23/2017  . HCAP (healthcare-associated pneumonia) 11/20/2017  . Anemia 11/20/2017  . Ischemic cardiomyopathy 09/01/2017  . Right hip pain   . Acute otitis media   . Chronic systolic CHF (congestive heart failure) ([place])   . Pressure injury of skin 07/20/2017  . Malignant otitis externa 07/19/2017  . Fall at home, initial encounter 07/19/2017  . S/P left THA, AA 05/12/2016  . LPRD (laryngopharyngeal reflux disease) 05/21/2015  . Other allergic rhinitis 05/21/2015  . Obesity 12/12/2014  . Upper airway cough syndrome 12/11/2014  . Cough variant asthma 07/27/2014  . Wheezing 06/20/2014  . Symptomatic bradycardia - s/p Biotronik (serial number [id number]) 02/08/2014  . Diabetes mellitus type 2, noninsulin dependent ([place])   . Edema of foot 03/06/2013  . Traumatic ecchymosis of right foot 03/06/2013  . Bone spur 12/30/2012  . Pain of  toe of left foot 12/30/2012  . Hyperlipidemia 07/13/2012  . HTN (hypertension) 04/12/2012  . Sick sinus syndrome ([place]) 08/17/2011  . PAF (paroxysmal atrial fibrillation) ([place]) 09/16/2010    Rexanne Mano, PT 06/07/2018, 5:15 PM  [facility] [address] [place], [name], [address] Phone: [phone]   Fax:  [phone]  Name: ELISON WORREL MRN: [id number] Date of Birth: 12/21/1932

## 2018-06-08 DIAGNOSIS — M25511 Pain in right shoulder: Secondary | ICD-10-CM | POA: Diagnosis not present

## 2018-06-08 NOTE — Telephone Encounter (Signed)
error 

## 2018-06-12 LAB — CUP PACEART REMOTE DEVICE CHECK
Date Time Interrogation Session: 20191222080813
Implantable Lead Location: 753860
Implantable Lead Model: 350
Implantable Lead Serial Number: 29625633
Implantable Pulse Generator Implant Date: 20150820
MDC IDC LEAD IMPLANT DT: 20150820
Pulse Gen Model: 394934
Pulse Gen Serial Number: 68372291

## 2018-06-13 ENCOUNTER — Encounter: Payer: Self-pay | Admitting: Physical Therapy

## 2018-06-13 ENCOUNTER — Ambulatory Visit: Payer: Medicare Other | Admitting: Physical Therapy

## 2018-06-13 ENCOUNTER — Ambulatory Visit (INDEPENDENT_AMBULATORY_CARE_PROVIDER_SITE_OTHER): Payer: Medicare Other | Admitting: Pharmacist

## 2018-06-13 DIAGNOSIS — M6281 Muscle weakness (generalized): Secondary | ICD-10-CM | POA: Diagnosis not present

## 2018-06-13 DIAGNOSIS — I480 Paroxysmal atrial fibrillation: Secondary | ICD-10-CM

## 2018-06-13 DIAGNOSIS — I4821 Permanent atrial fibrillation: Secondary | ICD-10-CM | POA: Diagnosis not present

## 2018-06-13 DIAGNOSIS — R2681 Unsteadiness on feet: Secondary | ICD-10-CM

## 2018-06-13 DIAGNOSIS — R2689 Other abnormalities of gait and mobility: Secondary | ICD-10-CM

## 2018-06-13 DIAGNOSIS — Z5181 Encounter for therapeutic drug level monitoring: Secondary | ICD-10-CM

## 2018-06-13 DIAGNOSIS — R293 Abnormal posture: Secondary | ICD-10-CM | POA: Diagnosis not present

## 2018-06-13 LAB — POCT INR: INR: 2.3 (ref 2.0–3.0)

## 2018-06-13 NOTE — Patient Instructions (Signed)
Description   Continue 1 tablet daily except 1.5 tablets on Saturdays. Recheck in 2 weeks.  Call [facility] [phone] with any concerns.

## 2018-06-13 NOTE — Therapy (Signed)
[facility] [address] [place], [name], [address] Phone: [phone]   Fax:  [phone]  Physical Therapy Treatment  Patient Details  Name: Oscar Baker MRN: [id number] Date of Birth: 24-Sep-1932 Referring Provider (PT): Marzetta Board, MD (hospitalist) [name], MD (PCP)   Encounter Date: 06/13/2018  PT End of Session - 06/13/18 1234    Visit Number  3    Number of Visits  17    Date for PT Re-Evaluation  07/02/18    Authorization Type  Medicare & BCBS supplement    Authorization Time Period  06/02/18 to 08/31/2018    PT Start Time  1150   pt in restroom   PT Stop Time  1228    PT Time Calculation (min)  38 min    Equipment Utilized During Treatment  Gait belt    Activity Tolerance  Patient tolerated treatment well    Behavior During Therapy  [facility] for tasks assessed/performed       Past Medical History:  Diagnosis Date  . Asthma 1950's   history of  . Atrial fibrillation ([place])   . Bilateral carpal tunnel syndrome   . Bilateral lower extremity edema   . Bladder tumor   . Chronic systolic heart failure (HCC)    Echo 1/19: Mild LVH, EF 45-50, inf HK, MAC, severe LAE, severe RAE // Echo 7/15: Mild LVH, mod focal basal sept hypertrophy, EF 55-60, AV peak and mean 16/9, trivial MR, mod LAE, PASP 38  . CLL (chronic lymphocytic leukemia) [facility]) oncologist-  dr Ilene Qua--   dx [phone] ;  Lymphocytosis, CLL - per [facility] note 05-11-2017 currently under active survillance,  CT 04-17-2014 show very small lymphadenopathy, no indication for treatment  . Coronary artery disease    cardiologist-  dr Cathie Olden--  08-18-2017 Intermittant risk nuclear study w/ large area of inferior infartion with no evidence ishcemia   . 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 Diabetes mellitus type 2, noninsulin dependent ([place])   . Elevated PSA    since prostatectomy but now resolved  . Hematuria 04/2017  . History of ear infection    Right  . History of  MI (myocardial infarction)    per myoview nuclear study 08-18-2017 , unknown when  . History of shingles 08/2017   L ear and scalp, possible  . Hyperlipidemia   . Hypertension   . Ischemic cardiomyopathy 09/01/2017   Presumed +CAD with Nuclear stress test 08/18/17 - Inferior scar, no ischemia, intermediate risk // med management unless +angina or worse dyspnea  . OA (osteoarthritis)   . Pacemaker 02/08/2014   followed by dr g. taylor--  single chamber Biotronik due to SSS  . Permanent atrial fibrillation   . Pneumonia 2019   Left lung  . Prostate cancer [facility]) urologist-  dr Diona Fanti   dx 2004--  Gleason 8, PSA 10.45--  11-28-2002  s/p  radical prostatectomy;  recurrent w/ increasing PSA, started ADT treatment  . RBBB (right bundle branch block)   . Sick sinus syndrome ([place])    a-Flutter with episodes of bradycardia; S/P Biotronik (serial number [id number]) 02-08-2014  . Urinary incontinence   . Wears hearing aid in right ear    receiver and transmitter    Past Surgical History:  Procedure Laterality Date  . APPENDECTOMY    . BACK SURGERY     disk  . CARDIAC CATHETERIZATION  09-03-1999  dr Cathie Olden   abnormal cardiolite study:  minor luminal irregularities but no  critial coronary artery stenosis  . CARDIOVASCULAR STRESS TEST  08-18-2017  dr Cathie Olden   Intermediate risk nuclear study w/ large area inferior infarction, no evidence of ishcemia (consistant w/ prior MI)/  study not gated due to frequent PVCs  . CARPAL TUNNEL RELEASE Right 2000  . CARPAL TUNNEL RELEASE Left 11/19/2009  . CATARACT EXTRACTION Right 07/2015  . CATARACT EXTRACTION Left 09/2015  . CYSTOSCOPY N/A 11/04/2017   Procedure: CYSTOSCOPY AND CAUTERIZATION OF BLADDER;  Surgeon: Franchot Gallo, MD;  Location[facility];  Service: Urology;  Laterality: N/A;  . INSERTION PENILE PROSTHESIS  02-22-2004    dr Mattie Marlin  [facility]  . IR THORACENTESIS ASP PLEURAL SPACE W/IMG GUIDE  11/26/2017  . KNEE ARTHROSCOPY Left  07/2010  . PERMANENT PACEMAKER INSERTION N/A 02/08/2014   Procedure: PERMANENT PACEMAKER INSERTION;  Surgeon: Evans Lance, MD;  Location: [facility] CATH LAB;  Service: Cardiovascular;  Laterality: N/A;  . PLEURAL EFFUSION DRAINAGE Left 11/30/2017   Procedure: DRAINAGE OF HEMOTHORAX;  Surgeon: Ivin Poot, MD;  Location: [facility];  Service: Thoracic;  Laterality: Left;  . RADICAL RETROPUBIC PROSTATECTOMY W/ BILATERAL PELVIC LYMPH NODE DISSECTION  11-28-2002   dr Mattie Marlin  [facility]  . TONSILLECTOMY    . TOTAL HIP ARTHROPLASTY Left 05/12/2016   Procedure: LEFT TOTAL HIP ARTHROPLASTY ANTERIOR APPROACH;  Surgeon: Paralee Cancel, MD;  Location: [facility];  Service: Orthopedics;  Laterality: Left;  . TOTAL HIP ARTHROPLASTY Right 07-15-2006   dr Alvan Dame  [facility]  . TRANSTHORACIC ECHOCARDIOGRAM  07/20/2017   mild LVH, ef 45-50%, hypokinesis of the basal-midinferior myocardium, due to AFib unable to evaluate diastolic function/  severe LAE and RAE/  trivial PR and TR  . VIDEO ASSISTED THORACOSCOPY Left 11/30/2017   Procedure: VIDEO ASSISTED THORACOSCOPY;  Surgeon: Ivin Poot, MD;  Location: [facility];  Service: Thoracic;  Laterality: Left;  [name] VIDEO ASSISTED THORACOSCOPY (VATS)/EMPYEMA Left 11/29/2017   Procedure: VIDEO ASSISTED THORACOSCOPY (VATS)/EMPYEMA;  Surgeon: Ivin Poot, MD;  Location: [facility];  Service: Thoracic;  Laterality: Left;    There were no vitals filed for this visit.  Subjective Assessment - 06/13/18 1153    Subjective  Found he can only do SLS with both hands or if single hand used it must be opposite hand and leg.    Patient is accompained by:  Family member    Pertinent History  Left THA 05/12/16, right THA 2008, CHF, MI, DM2, HTN, OA, prostate CA, shingles, A-fib, hearing loss, knee sg, recent thorascopy.  05/02/2017- bleeding and began with urinary incontinence    Limitations  Sitting;Lifting;Standing;Walking;House hold activities    Patient Stated Goals  work on balance, "be able to walk, get  off the walker (I'll live with a cane)".  Patient has used the walker since January when he fell.    Currently in Pain?  No/denies           Treatment- At counter: SLS bil UEs progressing to single UE for up to 3 seconds; standing hip abduction x 10 each leg with UE support; tandem with bil UE support x 30 sec Other there-ex- -sidelying clamshell bil x 10 -bridge with theraband around thighs (requiring hip abduction to maintain tension on band while bridging) added 5 second count x 20 reps total -standing in front of mirror with LLE lightly tapping 4" step while maintaining stable rt hip and torso (while using cane)  Gait training with SPC with quad rubber tip x 120 ft x 2;  40 ft with RW with focus on trunk stability, upright posture, and decreased reliance on UEs  Discussed he may be locking out his elbows and pushing RW too far ahead with flexed posture as a form of "tripod support" to allow use of accessory muscles to take bigger breaths. He reports he does find himself assuming tripod posture in siting when feeling dyspnea and agrees that may be part of why he pushes RW too far ahead with locked elbows and flexed torso.                       PT Short Term Goals - 06/03/18 0645      PT SHORT TERM GOAL #1   Title  Patient will complete HEP with supervision (as needed for technique) Target all STGs 07/02/2018    Time  4    Period  Weeks    Status  New    Target Date  07/02/18      PT SHORT TERM GOAL #2   Title  The patient will improve gait speed from 1.46 ft/sec to > or equal to 1.81 ft/sec to indicate lesser fall risk.    Time  4    Period  Weeks    Status  New      PT SHORT TERM GOAL #3   Title  Pateint will improve 5xSTS time to <25 sec (using bil UEs as on evaluation)    Time  4    Period  Weeks    Status  New      PT SHORT TERM GOAL #4   Title  Patient will complete 6 MWT with vital signs stable and distance >=500 ft    Period  Weeks    Status   New        PT Long Term Goals - 06/03/18 6384      PT LONG TERM GOAL #1   Title  The patient will return demo progression of HEP and verbalize plan for ongoing wellness plan.  (LTGs due date: 08/01/2018)    Target Date  08/01/18      PT LONG TERM GOAL #2   Title  Patient will improve gait velocity to >= 2.4 ft/sec with rollator, indicating lesser fall risk.     Time  8    Period  Weeks    Status  New      PT LONG TERM GOAL #3   Title  The patient will perform 5 time sit<>stand without UE support in < or equal to 20 seconds.    Time  8    Period  Weeks    Status  New      PT LONG TERM GOAL #4   Title  Patient will improve 6 MWT distance (measured at STGs) by 190 ft    Time  8    Period  Weeks    Status  New            Plan - 06/13/18 2018    Clinical Impression Statement  Session included instruction on SLS with focus on hip stability, use of core muscles, and decreasing UE reliance. Progressed to additional hip strengthening exercises to improve patient's stability with gait. Pre-gait and gait training with RW vs SPC. Ultimately, pt concluded that he can walk more quickly and more safely with RW. No additons made to HEP today, however will revisit next visit.     Rehab Potential  Good    PT Frequency  2x / week    PT Duration  8 weeks    PT Treatment/Interventions  ADLs/Self Care Home Management;DME Instruction;Gait training;Stair training;Functional mobility training;Therapeutic exercise;Therapeutic activities;Balance training;Neuromuscular re-education;Patient/family education;Vestibular;Canalith Repostioning    PT Next Visit Plan  work with rollator and upright posture with less UE reliance (states he uses rollator instead of 2 wheel RW at times at home); Continue to work on balance and endurance and add to HEP as approp    PT Home Exercise Plan  [phone]     Consulted and Agree with Plan of Care  Patient;Family member/caregiver    Family Member Consulted  spouse, Opal Sidles        Patient will benefit from skilled therapeutic intervention in order to improve the following deficits and impairments:  Abnormal gait, Cardiopulmonary status limiting activity, Decreased activity tolerance, Decreased balance, Decreased endurance, Decreased knowledge of use of DME, Decreased mobility, Difficulty walking, Decreased strength, Impaired flexibility, Impaired perceived functional ability, Obesity, Postural dysfunction, Pain  Visit Diagnosis: Other abnormalities of gait and mobility  Unsteadiness on feet  Muscle weakness (generalized)     Problem List Patient Active Problem List   Diagnosis Date Noted  . CAD (coronary artery disease) 05/21/2018  . Asthma exacerbation 05/21/2018  . Aspiration pneumonia ([place]) 05/21/2018  . Elevated INR   . Shoulder strain, right, initial encounter   . Acute respiratory failure with hypoxia ([place]) 05/20/2018  . Encounter for therapeutic drug monitoring 12/20/2017  . S/P thoracotomy 11/29/2017  . Sepsis ([place])   . Hypoxemia   . Pneumonia of left lower lobe due to infectious organism ([place])   . Mediastinal lymphadenopathy   . Chronic cough 11/23/2017  . CLL (chronic lymphocytic leukemia) ([place]) 11/23/2017  . Pleural effusion 11/23/2017  . HCAP (healthcare-associated pneumonia) 11/20/2017  . Anemia 11/20/2017  . Ischemic cardiomyopathy 09/01/2017  . Right hip pain   . Acute otitis media   . Chronic systolic CHF (congestive heart failure) ([place])   . Pressure injury of skin 07/20/2017  . Malignant otitis externa 07/19/2017  . Fall at home, initial encounter 07/19/2017  . S/P left THA, AA 05/12/2016  . LPRD (laryngopharyngeal reflux disease) 05/21/2015  . Other allergic rhinitis 05/21/2015  . Obesity 12/12/2014  . Upper airway cough syndrome 12/11/2014  . Cough variant asthma 07/27/2014  . Wheezing 06/20/2014  . Symptomatic bradycardia - s/p Biotronik (serial number [id number]) 02/08/2014  . Diabetes mellitus type 2, noninsulin  dependent ([place])   . Edema of foot 03/06/2013  . Traumatic ecchymosis of right foot 03/06/2013  . Bone spur 12/30/2012  . Pain of toe of left foot 12/30/2012  . Hyperlipidemia 07/13/2012  . HTN (hypertension) 04/12/2012  . Sick sinus syndrome ([place]) 08/17/2011  . PAF (paroxysmal atrial fibrillation) ([place]) 09/16/2010    Oscar Baker, PT 06/13/2018, 8:24 PM  [facility] [address] [place], [name], [address] Phone: [phone]   Fax:  [phone]  Name: Oscar Baker MRN: [id number] Date of Birth: Nov 25, 1932

## 2018-06-17 ENCOUNTER — Ambulatory Visit: Payer: Medicare Other | Admitting: Rehabilitative and Restorative Service Providers"

## 2018-06-17 DIAGNOSIS — R293 Abnormal posture: Secondary | ICD-10-CM | POA: Diagnosis not present

## 2018-06-17 DIAGNOSIS — R2681 Unsteadiness on feet: Secondary | ICD-10-CM

## 2018-06-17 DIAGNOSIS — M6281 Muscle weakness (generalized): Secondary | ICD-10-CM

## 2018-06-17 DIAGNOSIS — R2689 Other abnormalities of gait and mobility: Secondary | ICD-10-CM | POA: Diagnosis not present

## 2018-06-17 NOTE — Therapy (Signed)
[facility] [address] [place], [name], [address] Phone: [phone]   Fax:  [phone]  Physical Therapy Treatment  Patient Details  Name: Oscar Baker MRN: [id number] Date of Birth: 1932/08/14 Referring Provider (PT): Marzetta Board, MD (hospitalist) [name], MD (PCP)   Encounter Date: 06/17/2018  PT End of Session - 06/17/18 1225    Visit Number  4    Number of Visits  17    Date for PT Re-Evaluation  07/02/18    Authorization Type  Medicare & BCBS supplement    Authorization Time Period  06/02/18 to 08/31/2018    PT Start Time  1103    PT Stop Time  1150    PT Time Calculation (min)  47 min    Activity Tolerance  Patient tolerated treatment well    Behavior During Therapy  [facility] for tasks assessed/performed       Past Medical History:  Diagnosis Date  . Asthma 1950's   history of  . Atrial fibrillation ([place])   . Bilateral carpal tunnel syndrome   . Bilateral lower extremity edema   . Bladder tumor   . Chronic systolic heart failure (HCC)    Echo 1/19: Mild LVH, EF 45-50, inf HK, MAC, severe LAE, severe RAE // Echo 7/15: Mild LVH, mod focal basal sept hypertrophy, EF 55-60, AV peak and mean 16/9, trivial MR, mod LAE, PASP 38  . CLL (chronic lymphocytic leukemia) [facility]) oncologist-  dr Ilene Qua--   dx [phone] ;  Lymphocytosis, CLL - per lov note 05-11-2017 currently under active survillance,  CT 04-17-2014 show very small lymphadenopathy, no indication for treatment  . Coronary artery disease    cardiologist-  dr Cathie Olden--  08-18-2017 Intermittant risk nuclear study w/ large area of inferior infartion with no evidence ishcemia   . 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 Diabetes mellitus type 2, noninsulin dependent ([place])   . Elevated PSA    since prostatectomy but now resolved  . Hematuria 04/2017  . History of ear infection    Right  . History of MI (myocardial infarction)    per myoview nuclear study 08-18-2017  , unknown when  . History of shingles 08/2017   L ear and scalp, possible  . Hyperlipidemia   . Hypertension   . Ischemic cardiomyopathy 09/01/2017   Presumed +CAD with Nuclear stress test 08/18/17 - Inferior scar, no ischemia, intermediate risk // med management unless +angina or worse dyspnea  . OA (osteoarthritis)   . Pacemaker 02/08/2014   followed by dr g. taylor--  single chamber Biotronik due to SSS  . Permanent atrial fibrillation   . Pneumonia 2019   Left lung  . Prostate cancer [facility]) urologist-  dr Diona Fanti   dx 2004--  Gleason 8, PSA 10.45--  11-28-2002  s/p  radical prostatectomy;  recurrent w/ increasing PSA, started ADT treatment  . RBBB (right bundle branch block)   . Sick sinus syndrome ([place])    a-Flutter with episodes of bradycardia; S/P Biotronik (serial number [id number]) 02-08-2014  . Urinary incontinence   . Wears hearing aid in right ear    receiver and transmitter    Past Surgical History:  Procedure Laterality Date  . APPENDECTOMY    . BACK SURGERY     disk  . CARDIAC CATHETERIZATION  09-03-1999  dr Cathie Olden   abnormal cardiolite study:  minor luminal irregularities but no critial coronary artery stenosis  . CARDIOVASCULAR STRESS TEST  08-18-2017  dr Cathie Olden  Intermediate risk nuclear study w/ large area inferior infarction, no evidence of ishcemia (consistant w/ prior MI)/  study not gated due to frequent PVCs  . CARPAL TUNNEL RELEASE Right 2000  . CARPAL TUNNEL RELEASE Left 11/19/2009  . CATARACT EXTRACTION Right 07/2015  . CATARACT EXTRACTION Left 09/2015  . CYSTOSCOPY N/A 11/04/2017   Procedure: CYSTOSCOPY AND CAUTERIZATION OF BLADDER;  Surgeon: Franchot Gallo, MD;  Location: [facility];  Service: Urology;  Laterality: N/A;  . INSERTION PENILE PROSTHESIS  02-22-2004    dr Mattie Marlin  [facility]  . IR THORACENTESIS ASP PLEURAL SPACE W/IMG GUIDE  11/26/2017  . KNEE ARTHROSCOPY Left 07/2010  . PERMANENT PACEMAKER INSERTION N/A 02/08/2014    Procedure: PERMANENT PACEMAKER INSERTION;  Surgeon: Evans Lance, MD;  Location: [facility] CATH LAB;  Service: Cardiovascular;  Laterality: N/A;  . PLEURAL EFFUSION DRAINAGE Left 11/30/2017   Procedure: DRAINAGE OF HEMOTHORAX;  Surgeon: Ivin Poot, MD;  Location: [facility];  Service: Thoracic;  Laterality: Left;  . RADICAL RETROPUBIC PROSTATECTOMY W/ BILATERAL PELVIC LYMPH NODE DISSECTION  11-28-2002   dr Mattie Marlin  [facility]  . TONSILLECTOMY    . TOTAL HIP ARTHROPLASTY Left 05/12/2016   Procedure: LEFT TOTAL HIP ARTHROPLASTY ANTERIOR APPROACH;  Surgeon: Paralee Cancel, MD;  Location: [facility];  Service: Orthopedics;  Laterality: Left;  . TOTAL HIP ARTHROPLASTY Right 07-15-2006   dr Alvan Dame  [facility]  . TRANSTHORACIC ECHOCARDIOGRAM  07/20/2017   mild LVH, ef 45-50%, hypokinesis of the basal-midinferior myocardium, due to AFib unable to evaluate diastolic function/  severe LAE and RAE/  trivial PR and TR  . VIDEO ASSISTED THORACOSCOPY Left 11/30/2017   Procedure: VIDEO ASSISTED THORACOSCOPY;  Surgeon: Ivin Poot, MD;  Location: [facility];  Service: Thoracic;  Laterality: Left;  [name] VIDEO ASSISTED THORACOSCOPY (VATS)/EMPYEMA Left 11/29/2017   Procedure: VIDEO ASSISTED THORACOSCOPY (VATS)/EMPYEMA;  Surgeon: Ivin Poot, MD;  Location: [facility];  Service: Thoracic;  Laterality: Left;    There were no vitals filed for this visit.  Subjective Assessment - 06/17/18 1108    Subjective  The patient reports that he used the cane in therapy the  other day.     Pertinent History  Left THA 05/12/16, right THA 2008, CHF, MI, DM2, HTN, OA, prostate CA, shingles, A-fib, hearing loss, knee sg, recent thorascopy.  05/02/2017- bleeding and began with urinary incontinence    Patient Stated Goals  work on balance, "be able to walk, get off the walker (I'll live with a cane)".  Patient has used the walker since January when he fell.    Currently in Pain?  Yes    Pain Score  6     Pain Location  Shoulder    Pain Orientation  Right     Pain Descriptors / Indicators  Aching;Sore    Pain Type  Chronic pain    Pain Onset  More than a month ago    Pain Frequency  Intermittent    Aggravating Factors   patient feels he may have "twisted" his arm yesterday.    Pain Relieving Factors  unusre         [facility] PT Assessment - 06/17/18 1120      Precautions   Precautions  Other (comment);Fall    Precaution Comments  Patient is using a catheter and it came loose with supine exercises.                     [facility] Adult PT  Treatment/Exercise - 06/17/18 1120      Ambulation/Gait   Ambulation/Gait  Yes    Ambulation/Gait Assistance  5: Supervision;4: Min guard    Ambulation/Gait Assistance Details  Supervision with RW, CGA with SPC x 115 feet    Ambulation Distance (Feet)  115 Feet   x 2 reps, with SPC x 40 feet x 2, 70 feet x 2   Assistive device  Rolling walker;Straight cane    Gait Pattern  Step-through pattern;Decreased stride length;Lateral hip instability;Trunk flexed;Wide base of support      Exercises   Exercises  Other Exercises    Other Exercises   STANDING:  knee flexion x 12 reps, heel raises x 12 reps, toe raises x 12 reps, marching x 20 reps with one UE support.   SUPINE:  bridges x 5 second holds x 5 reps x 2 sets, bridging with hip ab/adduction while maintaining bridge x 8 reps, supine marching x 8 reps bilaterally, SLR x 5 reps with PT assisting to lift and then having patinet lower slowly bilateral LEs, alternating LE extension to targets from hooklying position x 5 reps bilaterally.  Sit<>stand x 10 reps.                PT Short Term Goals - 06/03/18 0645      PT SHORT TERM GOAL #1   Title  Patient will complete HEP with supervision (as needed for technique) Target all STGs 07/02/2018    Time  4    Period  Weeks    Status  New    Target Date  07/02/18      PT SHORT TERM GOAL #2   Title  The patient will improve gait speed from 1.46 ft/sec to > or equal to 1.81 ft/sec to indicate lesser  fall risk.    Time  4    Period  Weeks    Status  New      PT SHORT TERM GOAL #3   Title  Pateint will improve 5xSTS time to <25 sec (using bil UEs as on evaluation)    Time  4    Period  Weeks    Status  New      PT SHORT TERM GOAL #4   Title  Patient will complete 6 MWT with vital signs stable and distance >=500 ft    Period  Weeks    Status  New        PT Long Term Goals - 06/03/18 9371      PT LONG TERM GOAL #1   Title  The patient will return demo progression of HEP and verbalize plan for ongoing wellness plan.  (LTGs due date: 08/01/2018)    Target Date  08/01/18      PT LONG TERM GOAL #2   Title  Patient will improve gait velocity to >= 2.4 ft/sec with rollator, indicating lesser fall risk.     Time  8    Period  Weeks    Status  New      PT LONG TERM GOAL #3   Title  The patient will perform 5 time sit<>stand without UE support in < or equal to 20 seconds.    Time  8    Period  Weeks    Status  New      PT LONG TERM GOAL #4   Title  Patient will improve 6 MWT distance (measured at STGs) by 190 ft    Time  8  Period  Weeks    Status  New            Plan - 06/17/18 1233    Clinical Impression Statement  The patient notes difficulty lifting right leg into pants at home (while leaning on sink).  PT worked on LE strengthening and balance today.  Also progressed to [facility] with CGA for safety.      PT Treatment/Interventions  ADLs/Self Care Home Management;DME Instruction;Gait training;Stair training;Functional mobility training;Therapeutic exercise;Therapeutic activities;Balance training;Neuromuscular re-education;Patient/family education;Vestibular;Canalith Repostioning    PT Next Visit Plan  work with rollator and upright posture with less UE reliance (states he uses rollator instead of 2 wheel RW at times at home); Continue to work on balance and endurance and add to HEP as approp    Consulted and Agree with Plan of Care  Patient       Patient will benefit  from skilled therapeutic intervention in order to improve the following deficits and impairments:  Abnormal gait, Cardiopulmonary status limiting activity, Decreased activity tolerance, Decreased balance, Decreased endurance, Decreased knowledge of use of DME, Decreased mobility, Difficulty walking, Decreased strength, Impaired flexibility, Impaired perceived functional ability, Obesity, Postural dysfunction, Pain  Visit Diagnosis: Other abnormalities of gait and mobility  Unsteadiness on feet  Muscle weakness (generalized)     Problem List Patient Active Problem List   Diagnosis Date Noted  . CAD (coronary artery disease) 05/21/2018  . Asthma exacerbation 05/21/2018  . Aspiration pneumonia ([place]) 05/21/2018  . Elevated INR   . Shoulder strain, right, initial encounter   . Acute respiratory failure with hypoxia ([place]) 05/20/2018  . Encounter for therapeutic drug monitoring 12/20/2017  . S/P thoracotomy 11/29/2017  . Sepsis ([place])   . Hypoxemia   . Pneumonia of left lower lobe due to infectious organism ([place])   . Mediastinal lymphadenopathy   . Chronic cough 11/23/2017  . CLL (chronic lymphocytic leukemia) ([place]) 11/23/2017  . Pleural effusion 11/23/2017  . HCAP (healthcare-associated pneumonia) 11/20/2017  . Anemia 11/20/2017  . Ischemic cardiomyopathy 09/01/2017  . Right hip pain   . Acute otitis media   . Chronic systolic CHF (congestive heart failure) ([place])   . Pressure injury of skin 07/20/2017  . Malignant otitis externa 07/19/2017  . Fall at home, initial encounter 07/19/2017  . S/P left THA, AA 05/12/2016  . LPRD (laryngopharyngeal reflux disease) 05/21/2015  . Other allergic rhinitis 05/21/2015  . Obesity 12/12/2014  . Upper airway cough syndrome 12/11/2014  . Cough variant asthma 07/27/2014  . Wheezing 06/20/2014  . Symptomatic bradycardia - s/p Biotronik (serial number [id number]) 02/08/2014  . Diabetes mellitus type 2, noninsulin dependent ([place])   . Edema of foot  03/06/2013  . Traumatic ecchymosis of right foot 03/06/2013  . Bone spur 12/30/2012  . Pain of toe of left foot 12/30/2012  . Hyperlipidemia 07/13/2012  . HTN (hypertension) 04/12/2012  . Sick sinus syndrome ([place]) 08/17/2011  . PAF (paroxysmal atrial fibrillation) ([place]) 09/16/2010    Kennette Cuthrell , PT 06/17/2018, 1:40 PM  [facility] [address] [place], [name], [address] Phone: [phone]   Fax:  [phone]  Name: Oscar Baker MRN: [id number] Date of Birth: [birth year]

## 2018-06-21 ENCOUNTER — Encounter: Payer: Self-pay | Admitting: Pulmonary Disease

## 2018-06-21 ENCOUNTER — Ambulatory Visit (INDEPENDENT_AMBULATORY_CARE_PROVIDER_SITE_OTHER): Payer: Medicare Other | Admitting: Pulmonary Disease

## 2018-06-21 VITALS — BP 120/62 | HR 71 | Ht 70.0 in | Wt 197.0 lb

## 2018-06-21 DIAGNOSIS — R05 Cough: Secondary | ICD-10-CM | POA: Diagnosis not present

## 2018-06-21 DIAGNOSIS — R059 Cough, unspecified: Secondary | ICD-10-CM

## 2018-06-21 LAB — NITRIC OXIDE: Nitric Oxide: 18

## 2018-06-21 MED ORDER — HYDROCODONE-HOMATROPINE 5-1.5 MG/5ML PO SYRP: 5.0000 mL | ORAL_SOLUTION | ORAL | Status: DC | PRN

## 2018-06-21 NOTE — Progress Notes (Signed)
_0  ID: Oscar Baker, male    DOB: 1932-10-28, 84 y.o.   MRN: [id number]   Chief complaint: Chronic cough Cough is felt to be related to aspiration  Referring provider: [name], MD  HPI: 84 yo male former smoker (mainly pipe smoker )  followed for chronic cough  and sinus disease  last seen in office 2016 . Seen by PCCM during hospitalization 11/2017 with Bacteremia and Empyema .  PMH significant for CHF /CM , A Fib , Pacemaker ,CLL  06/21/2018  Protracted cough Feels cough is unchanged Over-the-counter medications not really helping his cough, has had multiple antibiotics for treatment of complications of his recurrent aspirations Not really coughing up significant secretions Will cough up clear secretions in the morning, clear secretions in the evening Has not been feeling poorly Has no fevers or chills Denies any chest pains or discomfort Continues to refuse to adhere to a dysphagia diet  Significant history:  Patient had a complicated recent medical history.  Patient fell earlier in 2019.  He had a left lower lobe pneumonia.  He required placement in a skilled nursing facility.  He was treated with antibiotics.  But developed a progressive left pleural effusion which was quite extensive into the entire left pleural space.  Patient was seen by thoracic surgery and underwent thoracoscopy for empyema .patient had postop complications and required reexploration on June 11 to remove clots in the pleural space.  A left VATS with decortication.  Found to have H influenza bacteremia felt from a pulmonary source.  Was treated with aggressive IV antibiotics. Since discharge patient is feeling better, getting stronger, is now back home. Using walker.  Has finished PT at home , going to go to OP PT.   He is not coughing up discolored mucus says he feels good. No fever or weight loss.   Wife says he does not follow aspiration diet . Found to be at risk for aspiration and rec on D 3  diet with nectar thick liquids .  He is trying to follow aspiration precautions, difficult.   Allergies  Allergen Reactions  . Altace [Ramipril] Other (See Comments)    "throat felt like had a knot in it"  . Augmentin [Amoxicillin-Pot Clavulanate] Diarrhea    severe  . Codeine Nausea And Vomiting    Nausea and vomiting   . Simvastatin Other (See Comments)    Leg aches    Immunization History  Administered Date(s) Administered  . Tdap 07/19/2017    Past Medical History:  Diagnosis Date  . Asthma 1950's   history of  . Atrial fibrillation ([place])   . Bilateral carpal tunnel syndrome   . Bilateral lower extremity edema   . Bladder tumor   . Chronic systolic heart failure (HCC)    Echo 1/19: Mild LVH, EF 45-50, inf HK, MAC, severe LAE, severe RAE // Echo 7/15: Mild LVH, mod focal basal sept hypertrophy, EF 55-60, AV peak and mean 16/9, trivial MR, mod LAE, PASP 38  . CLL (chronic lymphocytic leukemia) [facility]) oncologist-  dr Ilene Qua--   dx [phone] ;  Lymphocytosis, CLL - per lov note 05-11-2017 currently under active survillance,  CT 04-17-2014 show very small lymphadenopathy, no indication for treatment  . Coronary artery disease    cardiologist-  dr Cathie Olden--  08-18-2017 Intermittant risk nuclear study w/ large area of inferior infartion with no evidence ishcemia   . 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 Diabetes mellitus type 2, noninsulin dependent ([place])   .  Elevated PSA    since prostatectomy but now resolved  . Hematuria 04/2017  . History of ear infection    Right  . History of MI (myocardial infarction)    per myoview nuclear study 08-18-2017 , unknown when  . History of shingles 08/2017   L ear and scalp, possible  . Hyperlipidemia   . Hypertension   . Ischemic cardiomyopathy 09/01/2017   Presumed +CAD with Nuclear stress test 08/18/17 - Inferior scar, no ischemia, intermediate risk // med management unless +angina or worse dyspnea  . OA (osteoarthritis)   . Pacemaker 02/08/2014     followed by dr g. taylor--  single chamber Biotronik due to SSS  . Permanent atrial fibrillation   . Pneumonia 2019   Left lung  . Prostate cancer [facility]) urologist-  dr Diona Fanti   dx 2004--  Gleason 8, PSA 10.45--  11-28-2002  s/p  radical prostatectomy;  recurrent w/ increasing PSA, started ADT treatment  . RBBB (right bundle branch block)   . Sick sinus syndrome ([place])    a-Flutter with episodes of bradycardia; S/P Biotronik (serial number [id number]) 02-08-2014  . Urinary incontinence   . Wears hearing aid in right ear    receiver and transmitter    Tobacco History: Social History   Tobacco Use  Smoking Status Former Smoker  . Years: 25.00  . Types: Pipe, Cigars  . Last attempt to quit: 11/25/1975  . Years since quitting: 42.6  Smokeless Tobacco Never Used    Outpatient Medications Prior to Visit  Medication Sig Dispense Refill  . acetaminophen (TYLENOL) 500 MG tablet Take 2 tablets (1,000 mg total) by mouth every 6 (six) hours. 30 tablet 0  . atorvastatin (LIPITOR) 20 MG tablet TAKE ONE TABLET EACH DAY AT 6PM 90 tablet 3  . carvedilol (COREG) 3.125 MG tablet Take 1 tablet (3.125 mg total) by mouth 2 (two) times daily. 180 tablet 3  . fenofibrate micronized (LOFIBRA) 200 MG capsule TAKE 1 CAPSULE BY MOUTH DAILY BEFORE BREAKFAST 90 capsule 3  . furosemide (LASIX) 20 MG tablet Take 20 mg by mouth daily as needed for fluid or edema.    [name] glipiZIDE (GLUCOTROL XL) 2.5 MG 24 hr tablet Take 2.5 mg by mouth daily with breakfast.    . guaiFENesin (MUCINEX) 600 MG 12 hr tablet Take 600 mg by mouth as needed.     . loratadine (CLARITIN) 10 MG tablet Take 1 tablet (10 mg total) by mouth daily. 30 tablet 11  . metFORMIN (GLUCOPHAGE) 1000 MG tablet Take 1,000 mg by mouth 2 (two) times daily with a meal.      . multivitamin (THERAGRAN) per tablet Take 1 tablet by mouth daily. Will stop prior to procedure    . sitaGLIPtin (JANUVIA) 100 MG tablet Take 100 mg by mouth daily.    . vitamin B-12  (CYANOCOBALAMIN) 1000 MCG tablet Take 1,000 mcg by mouth daily. Will stop prior to procedure    . warfarin (COUMADIN) 5 MG tablet TAKE AS DIRECTED BY COUMADIN CLINIC (Patient taking differently: Take 5 mg by mouth See admin instructions. Take 57m on everyday except on saturdays take 7.519m) 40 tablet 3  . HYDROcodone-acetaminophen (NORCO/VICODIN) 5-325 MG tablet Take 1 tablet by mouth every 4 (four) hours as needed. 15 tablet 0   No facility-administered medications prior to visit.    Review of Systems  Constitutional: Negative.   HENT: Negative.   Respiratory: Positive for cough and sputum production. Negative for hemoptysis and shortness of breath.  Cardiovascular: Negative for chest pain.  Gastrointestinal: Negative.   Genitourinary: Positive for frequency and urgency.  Musculoskeletal: Negative.    Physical Exam  Constitutional: He is well-developed, well-nourished, and in no distress.  HENT:  Head: Normocephalic and atraumatic.  Eyes: Pupils are equal, round, and reactive to light. Conjunctivae and EOM are normal. Right eye exhibits no discharge. Left eye exhibits no discharge.  Neck: Normal range of motion. Neck supple. No tracheal deviation present. No thyromegaly present.  Cardiovascular: Normal rate and regular rhythm.  Pulmonary/Chest: Effort normal and breath sounds normal. No respiratory distress. He has no wheezes.    BP 120/62 (BP Location: Left Arm, Patient Position: Sitting, Cuff Size: Normal)   Pulse 71   Ht _0  (1.778 m)   Wt 197 lb (89.4 kg)   SpO2 95%   BMI 28.27 kg/m    Imaging: Chest x-ray reviewed by myself showing no acute infiltrate, pleural thickening on the left  FeNo 18 06/21/18   Assessment & Plan:  .  Aspiration pneumonia recently-resolved  .  Chronic cough -Chronic-likely related to aspiration -We will not adhere to dysphagia diet  .  Normal chest x-ray showing left basal pleural thickening  .  Increasing frustration about his  persistent cough -Adamant about not wanting to adhere to a dysphagia diet -He states he hates very slowly at present to try and reduce the aspiration  We talked today about a second opinion regarding his cough-he will be referred for second opinion  I did provide a prescription for Hycodan to see if this helps his cough-a list to keep him comfortable He is aware that continuing aspirations will lead to recurrent infections and other complications  Plan: .  Continue aspiration precautions  .  Continue medications for cough-Hycodan prescribed today  .  Encouraged to stay active  .  I will follow-up with him in about 3 months  .  Encouraged to call if any significant changes in symptoms  .  Referral for second opinion   Laurin Coder, MD 06/21/2018

## 2018-06-21 NOTE — Patient Instructions (Addendum)
Chronic cough Related to recurrent aspirations  We will seek a second opinion-we will transfer up a referral  We are glad to continue seeing you and taking care of you locally  I will see you back in the office in about 3 months  We will give you a prescription for Hycodan to be used as needed

## 2018-06-23 ENCOUNTER — Ambulatory Visit: Payer: Medicare Other | Attending: Internal Medicine | Admitting: Rehabilitative and Restorative Service Providers"

## 2018-06-23 ENCOUNTER — Telehealth: Payer: Self-pay | Admitting: Pulmonary Disease

## 2018-06-23 ENCOUNTER — Encounter: Payer: Self-pay | Admitting: Rehabilitative and Restorative Service Providers"

## 2018-06-23 DIAGNOSIS — R296 Repeated falls: Secondary | ICD-10-CM | POA: Insufficient documentation

## 2018-06-23 DIAGNOSIS — R293 Abnormal posture: Secondary | ICD-10-CM | POA: Diagnosis not present

## 2018-06-23 DIAGNOSIS — R2681 Unsteadiness on feet: Secondary | ICD-10-CM | POA: Insufficient documentation

## 2018-06-23 DIAGNOSIS — M6281 Muscle weakness (generalized): Secondary | ICD-10-CM | POA: Diagnosis not present

## 2018-06-23 DIAGNOSIS — R2689 Other abnormalities of gait and mobility: Secondary | ICD-10-CM

## 2018-06-23 MED ORDER — HYDROCODONE-HOMATROPINE 5-1.5 MG/5ML PO SYRP: 5.0000 mL | ORAL_SOLUTION | Freq: Four times a day (QID) | ORAL | 0 refills | Status: DC | PRN

## 2018-06-23 NOTE — Therapy (Signed)
[place] [address] [place], [name], [address] Phone: [phone]   Fax:  [phone]  Physical Therapy Treatment  Patient Details  Name: Oscar Baker MRN: [id number] Date of Birth: 1932/09/08 Referring Provider (PT): Marzetta Board, MD (hospitalist) [name], MD (PCP)   Encounter Date: 06/23/2018  PT End of Session - 06/23/18 1020    Visit Number  5    Number of Visits  17    Date for PT Re-Evaluation  07/02/18    Authorization Type  Medicare & BCBS supplement    Authorization Time Period  06/02/18 to 08/31/2018    PT Start Time  0935    PT Stop Time  1016    PT Time Calculation (min)  41 min    Equipment Utilized During Treatment  Gait belt    Activity Tolerance  Patient tolerated treatment well    Behavior During Therapy  [facility] for tasks assessed/performed       Past Medical History:  Diagnosis Date  . Asthma 1950's   history of  . Atrial fibrillation ([place])   . Bilateral carpal tunnel syndrome   . Bilateral lower extremity edema   . Bladder tumor   . Chronic systolic heart failure (HCC)    Echo 1/19: Mild LVH, EF 45-50, inf HK, MAC, severe LAE, severe RAE // Echo 7/15: Mild LVH, mod focal basal sept hypertrophy, EF 55-60, AV peak and mean 16/9, trivial MR, mod LAE, PASP 38  . CLL (chronic lymphocytic leukemia) [facility]) oncologist-  dr Ilene Qua--   dx [phone] ;  Lymphocytosis, CLL - per lov note 05-11-2017 currently under active survillance,  CT 04-17-2014 show very small lymphadenopathy, no indication for treatment  . Coronary artery disease    cardiologist-  dr Cathie Olden--  08-18-2017 Intermittant risk nuclear study w/ large area of inferior infartion with no evidence ishcemia   . 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 Diabetes mellitus type 2, noninsulin dependent ([place])   . Elevated PSA    since prostatectomy but now resolved  . Hematuria 04/2017  . History of ear infection    Right  . History of MI (myocardial  infarction)    per myoview nuclear study 08-18-2017 , unknown when  . History of shingles 08/2017   L ear and scalp, possible  . Hyperlipidemia   . Hypertension   . Ischemic cardiomyopathy 09/01/2017   Presumed +CAD with Nuclear stress test 08/18/17 - Inferior scar, no ischemia, intermediate risk // med management unless +angina or worse dyspnea  . OA (osteoarthritis)   . Pacemaker 02/08/2014   followed by dr g. taylor--  single chamber Biotronik due to SSS  . Permanent atrial fibrillation   . Pneumonia 2019   Left lung  . Prostate cancer [facility]) urologist-  dr Diona Fanti   dx 2004--  Gleason 8, PSA 10.45--  11-28-2002  s/p  radical prostatectomy;  recurrent w/ increasing PSA, started ADT treatment  . RBBB (right bundle branch block)   . Sick sinus syndrome ([place])    a-Flutter with episodes of bradycardia; S/P Biotronik (serial number [id number]) 02-08-2014  . Urinary incontinence   . Wears hearing aid in right ear    receiver and transmitter    Past Surgical History:  Procedure Laterality Date  . APPENDECTOMY    . BACK SURGERY     disk  . CARDIAC CATHETERIZATION  09-03-1999  dr Cathie Olden   abnormal cardiolite study:  minor luminal irregularities but no critial coronary artery stenosis  .  CARDIOVASCULAR STRESS TEST  08-18-2017  dr Cathie Olden   Intermediate risk nuclear study w/ large area inferior infarction, no evidence of ishcemia (consistant w/ prior MI)/  study not gated due to frequent PVCs  . CARPAL TUNNEL RELEASE Right 2000  . CARPAL TUNNEL RELEASE Left 11/19/2009  . CATARACT EXTRACTION Right 07/2015  . CATARACT EXTRACTION Left 09/2015  . CYSTOSCOPY N/A 11/04/2017   Procedure: CYSTOSCOPY AND CAUTERIZATION OF BLADDER;  Surgeon: Franchot Gallo, MD;  Location: [facility];  Service: Urology;  Laterality: N/A;  . INSERTION PENILE PROSTHESIS  02-22-2004    dr Mattie Marlin  [facility]  . IR THORACENTESIS ASP PLEURAL SPACE W/IMG GUIDE  11/26/2017  . KNEE ARTHROSCOPY Left 07/2010  .  PERMANENT PACEMAKER INSERTION N/A 02/08/2014   Procedure: PERMANENT PACEMAKER INSERTION;  Surgeon: Evans Lance, MD;  Location: [facility] CATH LAB;  Service: Cardiovascular;  Laterality: N/A;  . PLEURAL EFFUSION DRAINAGE Left 11/30/2017   Procedure: DRAINAGE OF HEMOTHORAX;  Surgeon: Ivin Poot, MD;  Location: [place];  Service: Thoracic;  Laterality: Left;  . RADICAL RETROPUBIC PROSTATECTOMY W/ BILATERAL PELVIC LYMPH NODE DISSECTION  11-28-2002   dr Mattie Marlin  [facility]  . TONSILLECTOMY    . TOTAL HIP ARTHROPLASTY Left 05/12/2016   Procedure: LEFT TOTAL HIP ARTHROPLASTY ANTERIOR APPROACH;  Surgeon: Paralee Cancel, MD;  Location: [facility];  Service: Orthopedics;  Laterality: Left;  . TOTAL HIP ARTHROPLASTY Right 07-15-2006   dr Alvan Dame  [facility]  . TRANSTHORACIC ECHOCARDIOGRAM  07/20/2017   mild LVH, ef 45-50%, hypokinesis of the basal-midinferior myocardium, due to AFib unable to evaluate diastolic function/  severe LAE and RAE/  trivial PR and TR  . VIDEO ASSISTED THORACOSCOPY Left 11/30/2017   Procedure: VIDEO ASSISTED THORACOSCOPY;  Surgeon: Ivin Poot, MD;  Location: [place];  Service: Thoracic;  Laterality: Left;  [name] VIDEO ASSISTED THORACOSCOPY (VATS)/EMPYEMA Left 11/29/2017   Procedure: VIDEO ASSISTED THORACOSCOPY (VATS)/EMPYEMA;  Surgeon: Ivin Poot, MD;  Location: [place];  Service: Thoracic;  Laterality: Left;    There were no vitals filed for this visit.  Subjective Assessment - 06/23/18 0937    Subjective  No falls or changes since last visit.  "I'm going to have to live with what I've got as far as the coughing."  The patient saw his pulmonary doctor said he may have to live with the coughing, but is sending him to [facility] for further assessment.    Pertinent History  Left THA 05/12/16, right THA 2008, CHF, MI, DM2, HTN, OA, prostate CA, shingles, A-fib, hearing loss, knee sg, recent thorascopy.  05/02/2017- bleeding and began with urinary incontinence    Patient Stated Goals  work on balance, "be  able to walk, get off the walker (I'll live with a cane)".  Patient has used the walker since January when he fell.    Currently in Pain?  Yes    Pain Score  5     Pain Location  Shoulder    Pain Orientation  Right    Pain Descriptors / Indicators  Aching;Sore    Pain Type  Chronic pain    Pain Onset  More than a month ago    Pain Frequency  Intermittent    Aggravating Factors   movement    Pain Relieving Factors  "it's getting better"         [facility] PT Assessment - 06/23/18 0940      6 minute walk test results    Aerobic Endurance Distance  Walked  572    Endurance additional comments  with RW mod indep.                   [facility] Adult PT Treatment/Exercise - 06/23/18 0940      Ambulation/Gait   Ambulation/Gait  Yes    Ambulation/Gait Assistance  6: Modified independent (Device/Increase time);4: Min guard    Ambulation/Gait Assistance Details  Mod indep with RW and CGA wiith SPC without loss of balance    Ambulation Distance (Feet)  500 Feet   350 ft with SPC   Assistive device  Rolling walker;Straight cane    Gait Pattern  Step-through pattern;Decreased stride length;Lateral hip instability;Trunk flexed;Wide base of support      Neuro Re-ed    Neuro Re-ed Details   sitting on physioball working on core stability for posture re-ed performing marching and LE extension with bilaterla UE support (mat and chair arm rest), lateral weight shifting without UE support, forward/backward walking for balance in parallel bars without UE support with CGA, sidestepping without UE support 10 feet x 4 reps      Exercises   Exercises  Other Exercises    Other Exercises   STANDING:  marching x 20 reps alternating, heel raises x 20 reps, toe raises x 15 reps, mini squats x 15 reps.  Step ups x 10 reps to 6" step with one UE support.               PT Short Term Goals - 06/23/18 1008      PT SHORT TERM GOAL #1   Title  Patient will complete HEP with supervision (as needed for  technique) Target all STGs 07/02/2018    Time  4    Period  Weeks    Status  On-going      PT SHORT TERM GOAL #2   Title  The patient will improve gait speed from 1.46 ft/sec to > or equal to 1.81 ft/sec to indicate lesser fall risk.    Time  4    Period  Weeks    Status  On-going      PT SHORT TERM GOAL #3   Title  Pateint will improve 5xSTS time to <25 sec (using bil UEs as on evaluation)    Time  4    Period  Weeks    Status  On-going      PT SHORT TERM GOAL #4   Title  Patient will complete 6 MWT with vital signs stable and distance >=500 ft    Baseline  572 ft in 6 minutes with RW mod indep.    Period  Weeks    Status  Achieved        PT Long Term Goals - 06/03/18 4970      PT LONG TERM GOAL #1   Title  The patient will return demo progression of HEP and verbalize plan for ongoing wellness plan.  (LTGs due date: 08/01/2018)    Target Date  08/01/18      PT LONG TERM GOAL #2   Title  Patient will improve gait velocity to >= 2.4 ft/sec with rollator, indicating lesser fall risk.     Time  8    Period  Weeks    Status  New      PT LONG TERM GOAL #3   Title  The patient will perform 5 time sit<>stand without UE support in < or equal to 20 seconds.    Time  8    Period  Weeks    Status  New      PT LONG TERM GOAL #4   Title  Patient will improve 6 MWT distance (measured at STGs) by 190 ft    Time  8    Period  Weeks    Status  New            Plan - 06/23/18 1020    Clinical Impression Statement  Patient met 1 LTG for completing 6 minute walk test.  PT to check remaining STGs over the next week.  The patient requested to avoid supine exercises due to catheter (came loose last session).  PT performed standing strengthening.     PT Treatment/Interventions  ADLs/Self Care Home Management;DME Instruction;Gait training;Stair training;Functional mobility training;Therapeutic exercise;Therapeutic activities;Balance training;Neuromuscular re-education;Patient/family  education;Vestibular;Canalith Repostioning    PT Next Visit Plan  Upright posture, gait with SPC, balance activities dec'ing UE support, endurance, progress HEP.  CHECK REMAINING STGS BY 1/11    Consulted and Agree with Plan of Care  Patient       Patient will benefit from skilled therapeutic intervention in order to improve the following deficits and impairments:  Abnormal gait, Cardiopulmonary status limiting activity, Decreased activity tolerance, Decreased balance, Decreased endurance, Decreased knowledge of use of DME, Decreased mobility, Difficulty walking, Decreased strength, Impaired flexibility, Impaired perceived functional ability, Obesity, Postural dysfunction, Pain  Visit Diagnosis: Other abnormalities of gait and mobility  Unsteadiness on feet  Muscle weakness (generalized)  Abnormal posture     Problem List Patient Active Problem List   Diagnosis Date Noted  . CAD (coronary artery disease) 05/21/2018  . Asthma exacerbation 05/21/2018  . Aspiration pneumonia ([place]) 05/21/2018  . Elevated INR   . Shoulder strain, right, initial encounter   . Acute respiratory failure with hypoxia ([place]) 05/20/2018  . Encounter for therapeutic drug monitoring 12/20/2017  . S/P thoracotomy 11/29/2017  . Sepsis ([place])   . Hypoxemia   . Pneumonia of left lower lobe due to infectious organism ([place])   . Mediastinal lymphadenopathy   . Chronic cough 11/23/2017  . CLL (chronic lymphocytic leukemia) ([place]) 11/23/2017  . Pleural effusion 11/23/2017  . HCAP (healthcare-associated pneumonia) 11/20/2017  . Anemia 11/20/2017  . Ischemic cardiomyopathy 09/01/2017  . Right hip pain   . Acute otitis media   . Chronic systolic CHF (congestive heart failure) ([place])   . Pressure injury of skin 07/20/2017  . Malignant otitis externa 07/19/2017  . Fall at home, initial encounter 07/19/2017  . S/P left THA, AA 05/12/2016  . LPRD (laryngopharyngeal reflux disease) 05/21/2015  . Other allergic  rhinitis 05/21/2015  . Obesity 12/12/2014  . Upper airway cough syndrome 12/11/2014  . Cough variant asthma 07/27/2014  . Wheezing 06/20/2014  . Symptomatic bradycardia - s/p Biotronik (serial number [id number]) 02/08/2014  . Diabetes mellitus type 2, noninsulin dependent ([place])   . Edema of foot 03/06/2013  . Traumatic ecchymosis of right foot 03/06/2013  . Bone spur 12/30/2012  . Pain of toe of left foot 12/30/2012  . Hyperlipidemia 07/13/2012  . HTN (hypertension) 04/12/2012  . Sick sinus syndrome ([place]) 08/17/2011  . PAF (paroxysmal atrial fibrillation) ([place]) 09/16/2010    Jathniel Smeltzer, PT  06/23/2018, 10:23 AM  [place] [address] [place], [name], [address] Phone: [phone]   Fax:  [phone]  Name: Oscar Baker MRN: [id number] Date of Birth: 21-Sep-1932

## 2018-06-23 NOTE — Telephone Encounter (Signed)
Called and spoke with patient, advised him that prescription has been sent in. If anything else is needed to please call us. Nothing further needed.

## 2018-06-23 NOTE — Telephone Encounter (Signed)
sent 

## 2018-06-23 NOTE — Telephone Encounter (Signed)
Patient wife called back following up on this message; pt is need of the medication

## 2018-06-23 NOTE — Telephone Encounter (Signed)
Spoke with pt. He was very angry that his prescription was not sent in. It looks like the prescription was placed in his chart but it was not done properly to be sent to the pharmacy. I tried to explain this to the pt and apologize for our error and he began to raise his voice at me and stated, "I like Dr. Jenetta Downer but the rest of y'all ain't worth nothing." I once again apologized for this error and assured him that his prescription will be taken care of.  Beth - can you please sent in Hycodan to [facility]? Thanks.

## 2018-06-24 ENCOUNTER — Encounter: Payer: Self-pay | Admitting: Physical Therapy

## 2018-06-24 ENCOUNTER — Ambulatory Visit: Payer: Medicare Other | Admitting: Physical Therapy

## 2018-06-24 DIAGNOSIS — R293 Abnormal posture: Secondary | ICD-10-CM

## 2018-06-24 DIAGNOSIS — M6281 Muscle weakness (generalized): Secondary | ICD-10-CM | POA: Diagnosis not present

## 2018-06-24 DIAGNOSIS — R2689 Other abnormalities of gait and mobility: Secondary | ICD-10-CM | POA: Diagnosis not present

## 2018-06-24 DIAGNOSIS — R2681 Unsteadiness on feet: Secondary | ICD-10-CM

## 2018-06-24 DIAGNOSIS — R296 Repeated falls: Secondary | ICD-10-CM | POA: Diagnosis not present

## 2018-06-25 NOTE — Therapy (Signed)
[facility] [address] [place], [name], [address] Phone: [phone]   Fax:  [phone]  Physical Therapy Treatment  Patient Details  Name: Oscar Baker MRN: [id number] Date of Birth: [birth year] Referring Provider (PT): Marzetta Board, MD (hospitalist) [name], MD (PCP)   Encounter Date: 06/24/2018  PT End of Session - 06/24/18 1026    Visit Number  6    Number of Visits  17    Date for PT Re-Evaluation  07/02/18    Authorization Type  Medicare & BCBS supplement    Authorization Time Period  06/02/18 to 08/31/2018    PT Start Time  1026    PT Stop Time  1106    PT Time Calculation (min)  40 min    Equipment Utilized During Treatment  Gait belt    Activity Tolerance  Patient tolerated treatment well    Behavior During Therapy  [facility] for tasks assessed/performed       Past Medical History:  Diagnosis Date  . Asthma 1950's   history of  . Atrial fibrillation ([place])   . Bilateral carpal tunnel syndrome   . Bilateral lower extremity edema   . Bladder tumor   . Chronic systolic heart failure (HCC)    Echo 1/19: Mild LVH, EF 45-50, inf HK, MAC, severe LAE, severe RAE // Echo 7/15: Mild LVH, mod focal basal sept hypertrophy, EF 55-60, AV peak and mean 16/9, trivial MR, mod LAE, PASP 38  . CLL (chronic lymphocytic leukemia) [facility]) oncologist-  dr Ilene Qua--   dx [phone] ;  Lymphocytosis, CLL - per lov note 05-11-2017 currently under active survillance,  CT 04-17-2014 show very small lymphadenopathy, no indication for treatment  . Coronary artery disease    cardiologist-  dr Cathie Olden--  08-18-2017 Intermittant risk nuclear study w/ large area of inferior infartion with no evidence ishcemia   . 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 Diabetes mellitus type 2, noninsulin dependent ([place])   . Elevated PSA    since prostatectomy but now resolved  . Hematuria 04/2017  . History of ear infection    Right  . History of MI (myocardial  infarction)    per myoview nuclear study 08-18-2017 , unknown when  . History of shingles 08/2017   L ear and scalp, possible  . Hyperlipidemia   . Hypertension   . Ischemic cardiomyopathy 09/01/2017   Presumed +CAD with Nuclear stress test 08/18/17 - Inferior scar, no ischemia, intermediate risk // med management unless +angina or worse dyspnea  . OA (osteoarthritis)   . Pacemaker 02/08/2014   followed by dr g. taylor--  single chamber Biotronik due to SSS  . Permanent atrial fibrillation   . Pneumonia 2019   Left lung  . Prostate cancer [facility]) urologist-  dr Diona Fanti   dx 2004--  Gleason 8, PSA 10.45--  11-28-2002  s/p  radical prostatectomy;  recurrent w/ increasing PSA, started ADT treatment  . RBBB (right bundle branch block)   . Sick sinus syndrome ([place])    a-Flutter with episodes of bradycardia; S/P Biotronik (serial number [id number]) 02-08-2014  . Urinary incontinence   . Wears hearing aid in right ear    receiver and transmitter    Past Surgical History:  Procedure Laterality Date  . APPENDECTOMY    . BACK SURGERY     disk  . CARDIAC CATHETERIZATION  09-03-1999  dr Cathie Olden   abnormal cardiolite study:  minor luminal irregularities but no critial coronary artery stenosis  .  CARDIOVASCULAR STRESS TEST  08-18-2017  dr Cathie Olden   Intermediate risk nuclear study w/ large area inferior infarction, no evidence of ishcemia (consistant w/ prior MI)/  study not gated due to frequent PVCs  . CARPAL TUNNEL RELEASE Right 2000  . CARPAL TUNNEL RELEASE Left 11/19/2009  . CATARACT EXTRACTION Right 07/2015  . CATARACT EXTRACTION Left 09/2015  . CYSTOSCOPY N/A 11/04/2017   Procedure: CYSTOSCOPY AND CAUTERIZATION OF BLADDER;  Surgeon: Franchot Gallo, MD;  Location: [facility];  Service: Urology;  Laterality: N/A;  . INSERTION PENILE PROSTHESIS  02-22-2004    dr Mattie Marlin  [facility]  . IR THORACENTESIS ASP PLEURAL SPACE W/IMG GUIDE  11/26/2017  . KNEE ARTHROSCOPY Left 07/2010  .  PERMANENT PACEMAKER INSERTION N/A 02/08/2014   Procedure: PERMANENT PACEMAKER INSERTION;  Surgeon: Evans Lance, MD;  Location: [facility] CATH LAB;  Service: Cardiovascular;  Laterality: N/A;  . PLEURAL EFFUSION DRAINAGE Left 11/30/2017   Procedure: DRAINAGE OF HEMOTHORAX;  Surgeon: Ivin Poot, MD;  Location: [place];  Service: Thoracic;  Laterality: Left;  . RADICAL RETROPUBIC PROSTATECTOMY W/ BILATERAL PELVIC LYMPH NODE DISSECTION  11-28-2002   dr Mattie Marlin  [facility]  . TONSILLECTOMY    . TOTAL HIP ARTHROPLASTY Left 05/12/2016   Procedure: LEFT TOTAL HIP ARTHROPLASTY ANTERIOR APPROACH;  Surgeon: Paralee Cancel, MD;  Location: [facility];  Service: Orthopedics;  Laterality: Left;  . TOTAL HIP ARTHROPLASTY Right 07-15-2006   dr Alvan Dame  [facility]  . TRANSTHORACIC ECHOCARDIOGRAM  07/20/2017   mild LVH, ef 45-50%, hypokinesis of the basal-midinferior myocardium, due to AFib unable to evaluate diastolic function/  severe LAE and RAE/  trivial PR and TR  . VIDEO ASSISTED THORACOSCOPY Left 11/30/2017   Procedure: VIDEO ASSISTED THORACOSCOPY;  Surgeon: Ivin Poot, MD;  Location: [place];  Service: Thoracic;  Laterality: Left;  [name] VIDEO ASSISTED THORACOSCOPY (VATS)/EMPYEMA Left 11/29/2017   Procedure: VIDEO ASSISTED THORACOSCOPY (VATS)/EMPYEMA;  Surgeon: Ivin Poot, MD;  Location: [facility] OR;  Service: Thoracic;  Laterality: Left;      Subjective Assessment - 06/24/18 1529    Subjective  Reports no changes    Pertinent History  Left THA 05/12/16, right THA 2008, CHF, MI, DM2, HTN, OA, prostate CA, shingles, A-fib, hearing loss, knee sg, recent thorascopy.  05/02/2017- bleeding and began with urinary incontinence    Patient Stated Goals  work on balance, "be able to walk, get off the walker (I'll live with a cane)".  Patient has used the walker since January when he fell.    Currently in Pain?  No/denies         [facility] PT Assessment - 06/24/18 1030      Transfers   Five time sit to stand comments   13.60                    [facility] Adult PT Treatment/Exercise - 06/24/18 1030      Transfers   Sit to Stand  6: Modified independent (Device/Increase time)    Sit to Stand Details (indicate cue type and reason)  insists he must use his hands for security/balance    Number of Reps  2 sets   5 reps   Comments  added to his HEP for thigh strengthening; pt plans to do from his recliner      Ambulation/Gait   Ambulation/Gait Assistance  5: Supervision    Ambulation/Gait Assistance Details  with vc for proximity to RW and upright posture--pt then maintains for ~  80 ft     Ambulation Distance (Feet)  100 Feet   120, 100   Assistive device  Rolling walker    Gait Pattern  Step-through pattern;Decreased stride length;Lateral hip instability;Trunk flexed;Wide base of support      Posture/Postural Control   Posture/Postural Control  Postural limitations    Postural Limitations  Rounded Shoulders;Forward head;Flexed trunk    Posture Comments  while working on 'wall bumps" and hip strategy, had pt lean back against wall to work on postural alignment including chin tuck      Knee/Hip Exercises: Standing   Lateral Step Up  Both;1 set;10 reps;Hand Hold: 2;Step Height: 4"    Forward Step Up  Both;1 set;20 reps;Hand Hold: 2;Step Height: 4"    Other Standing Knee Exercises  backward step ups x 10 each leg, bil UE support // bars          Balance Exercises - 06/24/18 1700      Balance Exercises: Standing   SLS  Eyes open;Upper extremity support 1;2 reps;15 secs    Wall Bumps  Hip    Wall Bumps-Hips  Eyes opened;Eyes closed;Anterior/posterior;10 reps;5 reps    Balance Beam  blue beam tandem walk x 4 bil UE support    Retro Gait  5 reps;Upper extremity support    Sidestepping  4 reps    Marching Limitations  slow march 1 UE         PT Education - 06/25/18 0705    Education Details  added to HEP    Person(s) Educated  Patient    Methods  Explanation;Demonstration;Verbal cues;Handout     Comprehension  Verbalized understanding;Returned demonstration;Verbal cues required       PT Short Term Goals - 06/24/18 1700      PT SHORT TERM GOAL #1   Title  Patient will complete HEP with supervision (as needed for technique) Target all STGs 07/02/2018    Time  4    Period  Weeks    Status  On-going      PT SHORT TERM GOAL #2   Title  The patient will improve gait speed from 1.46 ft/sec to > or equal to 1.81 ft/sec to indicate lesser fall risk.    Time  4    Period  Weeks    Status  On-going      PT SHORT TERM GOAL #3   Title  Pateint will improve 5xSTS time to <25 sec (using bil UEs as on evaluation)    Baseline  06/24/18  13.6 sec, bil UEs from chair    Time  4    Period  Weeks    Status  Achieved      PT SHORT TERM GOAL #4   Title  Patient will complete 6 MWT with vital signs stable and distance >=500 ft    Baseline  572 ft in 6 minutes with RW mod indep.    Period  Weeks    Status  Achieved        PT Long Term Goals - 06/03/18 8315      PT LONG TERM GOAL #1   Title  The patient will return demo progression of HEP and verbalize plan for ongoing wellness plan.  (LTGs due date: 08/01/2018)    Target Date  08/01/18      PT LONG TERM GOAL #2   Title  Patient will improve gait velocity to >= 2.4 ft/sec with rollator, indicating lesser fall risk.     Time  8    Period  Weeks    Status  New      PT LONG TERM GOAL #3   Title  The patient will perform 5 time sit<>stand without UE support in < or equal to 20 seconds.    Time  8    Period  Weeks    Status  New      PT LONG TERM GOAL #4   Title  Patient will improve 6 MWT distance (measured at STGs) by 190 ft    Time  8    Period  Weeks    Status  New            Plan - 06/24/18 1106    Clinical Impression Statement  Pt met STG for 5xSTS in 13.6 seconds (using bil UEs) showing significant improvement. Remainder of session focused on balance, strength, posture, and gait training. Will continue to assess STGs  (due by 1/11) and consider practice with SPC (if pt remains interested).     PT Frequency  2x / week    PT Duration  8 weeks    PT Treatment/Interventions  ADLs/Self Care Home Management;DME Instruction;Gait training;Stair training;Functional mobility training;Therapeutic exercise;Therapeutic activities;Balance training;Neuromuscular re-education;Patient/family education;Vestibular;Canalith Repostioning    PT Next Visit Plan  Monday 1/6 needs to leave a bit early to get to coumadin clinic; Upright posture, gait with SPC, balance activities dec'ing UE support, endurance, progress HEP.  CHECK REMAINING STGS BY 1/11    PT Home Exercise Plan  [email or web address]     Consulted and Agree with Plan of Care  Patient       Patient will benefit from skilled therapeutic intervention in order to improve the following deficits and impairments:  Abnormal gait, Cardiopulmonary status limiting activity, Decreased activity tolerance, Decreased balance, Decreased endurance, Decreased knowledge of use of DME, Decreased mobility, Difficulty walking, Decreased strength, Impaired flexibility, Impaired perceived functional ability, Obesity, Postural dysfunction, Pain  Visit Diagnosis: Other abnormalities of gait and mobility  Unsteadiness on feet  Abnormal posture     Problem List Patient Active Problem List   Diagnosis Date Noted  . CAD (coronary artery disease) 05/21/2018  . Asthma exacerbation 05/21/2018  . Aspiration pneumonia ([place]) 05/21/2018  . Elevated INR   . Shoulder strain, right, initial encounter   . Acute respiratory failure with hypoxia ([place]) 05/20/2018  . Encounter for therapeutic drug monitoring 12/20/2017  . S/P thoracotomy 11/29/2017  . Sepsis ([place])   . Hypoxemia   . Pneumonia of left lower lobe due to infectious organism ([place])   . Mediastinal lymphadenopathy   . Chronic cough 11/23/2017  . CLL (chronic lymphocytic leukemia) ([place]) 11/23/2017  . Pleural effusion 11/23/2017  . HCAP  (healthcare-associated pneumonia) 11/20/2017  . Anemia 11/20/2017  . Ischemic cardiomyopathy 09/01/2017  . Right hip pain   . Acute otitis media   . Chronic systolic CHF (congestive heart failure) ([place])   . Pressure injury of skin 07/20/2017  . Malignant otitis externa 07/19/2017  . Fall at home, initial encounter 07/19/2017  . S/P left THA, AA 05/12/2016  . LPRD (laryngopharyngeal reflux disease) 05/21/2015  . Other allergic rhinitis 05/21/2015  . Obesity 12/12/2014  . Upper airway cough syndrome 12/11/2014  . Cough variant asthma 07/27/2014  . Wheezing 06/20/2014  . Symptomatic bradycardia - s/p Biotronik (serial number [id number]) 02/08/2014  . Diabetes mellitus type 2, noninsulin dependent ([place])   . Edema of foot 03/06/2013  . Traumatic ecchymosis of right foot 03/06/2013  . Bone spur 12/30/2012  .  Pain of toe of left foot 12/30/2012  . Hyperlipidemia 07/13/2012  . HTN (hypertension) 04/12/2012  . Sick sinus syndrome ([place]) 08/17/2011  . PAF (paroxysmal atrial fibrillation) ([place]) 09/16/2010    Rexanne Mano, PT 06/25/2018, 7:11 AM  [facility] [address] [place], [name], [address] Phone: [phone]   Fax:  [phone]  Name: Oscar Baker MRN: [id number] Date of Birth: 1932/10/26

## 2018-06-25 NOTE — Patient Instructions (Signed)
Access Code: [email or web address]  URL: [email or web address]  Date: 06/24/2018  Prepared by: Barry Brunner   Exercises  Single Leg Stance with Support - 3 reps - 1 sets - 10-30seconds hold - 1x daily - 7x weekly  Sit to Stand with Hands on Knees - 10 reps - 1 sets - 1-2x daily - 5x weekly

## 2018-06-27 ENCOUNTER — Ambulatory Visit (INDEPENDENT_AMBULATORY_CARE_PROVIDER_SITE_OTHER): Payer: Medicare Other | Admitting: Pharmacist

## 2018-06-27 ENCOUNTER — Ambulatory Visit: Payer: Medicare Other | Admitting: Physical Therapy

## 2018-06-27 ENCOUNTER — Encounter: Payer: Self-pay | Admitting: Physical Therapy

## 2018-06-27 DIAGNOSIS — R2681 Unsteadiness on feet: Secondary | ICD-10-CM

## 2018-06-27 DIAGNOSIS — I4821 Permanent atrial fibrillation: Secondary | ICD-10-CM | POA: Diagnosis not present

## 2018-06-27 DIAGNOSIS — R2689 Other abnormalities of gait and mobility: Secondary | ICD-10-CM | POA: Diagnosis not present

## 2018-06-27 DIAGNOSIS — R296 Repeated falls: Secondary | ICD-10-CM | POA: Diagnosis not present

## 2018-06-27 DIAGNOSIS — R293 Abnormal posture: Secondary | ICD-10-CM | POA: Diagnosis not present

## 2018-06-27 DIAGNOSIS — M6281 Muscle weakness (generalized): Secondary | ICD-10-CM

## 2018-06-27 LAB — POCT INR: INR: 2.9 (ref 2.0–3.0)

## 2018-06-27 NOTE — Therapy (Signed)
[facility] [address] [place], [name], [address] Phone: [phone]   Fax:  [phone]  Physical Therapy Treatment  Patient Details  Name: Oscar Baker MRN: [id number] Date of Birth: 1932/11/14 Referring Provider (PT): Marzetta Board, MD (hospitalist) [name], MD (PCP)   Encounter Date: 06/27/2018  PT End of Session - 06/27/18 1315    Visit Number  7    Number of Visits  17    Date for PT Re-Evaluation  07/02/18    Authorization Type  Medicare & BCBS supplement    Authorization Time Period  06/02/18 to 08/31/2018    PT Start Time  1316    PT Stop Time  1355    PT Time Calculation (min)  39 min    Activity Tolerance  Patient tolerated treatment well    Behavior During Therapy  [facility] for tasks assessed/performed       Past Medical History:  Diagnosis Date  . Asthma 1950's   history of  . Atrial fibrillation ([place])   . Bilateral carpal tunnel syndrome   . Bilateral lower extremity edema   . Bladder tumor   . Chronic systolic heart failure (HCC)    Echo 1/19: Mild LVH, EF 45-50, inf HK, MAC, severe LAE, severe RAE // Echo 7/15: Mild LVH, mod focal basal sept hypertrophy, EF 55-60, AV peak and mean 16/9, trivial MR, mod LAE, PASP 38  . CLL (chronic lymphocytic leukemia) [facility]) oncologist-  dr Ilene Qua--   dx [phone] ;  Lymphocytosis, CLL - per lov note 05-11-2017 currently under active survillance,  CT 04-17-2014 show very small lymphadenopathy, no indication for treatment  . Coronary artery disease    cardiologist-  dr Cathie Olden--  08-18-2017 Intermittant risk nuclear study w/ large area of inferior infartion with no evidence ishcemia   . 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 Diabetes mellitus type 2, noninsulin dependent ([place])   . Elevated PSA    since prostatectomy but now resolved  . Hematuria 04/2017  . History of ear infection    Right  . History of MI (myocardial infarction)    per myoview nuclear study 08-18-2017 ,  unknown when  . History of shingles 08/2017   L ear and scalp, possible  . Hyperlipidemia   . Hypertension   . Ischemic cardiomyopathy 09/01/2017   Presumed +CAD with Nuclear stress test 08/18/17 - Inferior scar, no ischemia, intermediate risk // med management unless +angina or worse dyspnea  . OA (osteoarthritis)   . Pacemaker 02/08/2014   followed by dr g. taylor--  single chamber Biotronik due to SSS  . Permanent atrial fibrillation   . Pneumonia 2019   Left lung  . Prostate cancer [facility]) urologist-  dr Diona Fanti   dx 2004--  Gleason 8, PSA 10.45--  11-28-2002  s/p  radical prostatectomy;  recurrent w/ increasing PSA, started ADT treatment  . RBBB (right bundle branch block)   . Sick sinus syndrome ([place])    a-Flutter with episodes of bradycardia; S/P Biotronik (serial number [id number]) 02-08-2014  . Urinary incontinence   . Wears hearing aid in right ear    receiver and transmitter    Past Surgical History:  Procedure Laterality Date  . APPENDECTOMY    . BACK SURGERY     disk  . CARDIAC CATHETERIZATION  09-03-1999  dr Cathie Olden   abnormal cardiolite study:  minor luminal irregularities but no critial coronary artery stenosis  . CARDIOVASCULAR STRESS TEST  08-18-2017  dr Cathie Olden  Intermediate risk nuclear study w/ large area inferior infarction, no evidence of ishcemia (consistant w/ prior MI)/  study not gated due to frequent PVCs  . CARPAL TUNNEL RELEASE Right 2000  . CARPAL TUNNEL RELEASE Left 11/19/2009  . CATARACT EXTRACTION Right 07/2015  . CATARACT EXTRACTION Left 09/2015  . CYSTOSCOPY N/A 11/04/2017   Procedure: CYSTOSCOPY AND CAUTERIZATION OF BLADDER;  Surgeon: Franchot Gallo, MD;  Location: [facility];  Service: Urology;  Laterality: N/A;  . INSERTION PENILE PROSTHESIS  02-22-2004    dr Mattie Marlin  [facility]  . IR THORACENTESIS ASP PLEURAL SPACE W/IMG GUIDE  11/26/2017  . KNEE ARTHROSCOPY Left 07/2010  . PERMANENT PACEMAKER INSERTION N/A 02/08/2014   Procedure:  PERMANENT PACEMAKER INSERTION;  Surgeon: Evans Lance, MD;  Location: [facility] CATH LAB;  Service: Cardiovascular;  Laterality: N/A;  . PLEURAL EFFUSION DRAINAGE Left 11/30/2017   Procedure: DRAINAGE OF HEMOTHORAX;  Surgeon: Ivin Poot, MD;  Location: [facility] [place];  Service: Thoracic;  Laterality: Left;  . RADICAL RETROPUBIC PROSTATECTOMY W/ BILATERAL PELVIC LYMPH NODE DISSECTION  11-28-2002   dr Mattie Marlin  [facility]  . TONSILLECTOMY    . TOTAL HIP ARTHROPLASTY Left 05/12/2016   Procedure: LEFT TOTAL HIP ARTHROPLASTY ANTERIOR APPROACH;  Surgeon: Paralee Cancel, MD;  Location: [facility];  Service: Orthopedics;  Laterality: Left;  . TOTAL HIP ARTHROPLASTY Right 07-15-2006   dr Alvan Dame  [facility]  . TRANSTHORACIC ECHOCARDIOGRAM  07/20/2017   mild LVH, ef 45-50%, hypokinesis of the basal-midinferior myocardium, due to AFib unable to evaluate diastolic function/  severe LAE and RAE/  trivial PR and TR  . VIDEO ASSISTED THORACOSCOPY Left 11/30/2017   Procedure: VIDEO ASSISTED THORACOSCOPY;  Surgeon: Ivin Poot, MD;  Location: [facility];  Service: Thoracic;  Laterality: Left;  [name] VIDEO ASSISTED THORACOSCOPY (VATS)/EMPYEMA Left 11/29/2017   Procedure: VIDEO ASSISTED THORACOSCOPY (VATS)/EMPYEMA;  Surgeon: Ivin Poot, MD;  Location: [facility];  Service: Thoracic;  Laterality: Left;    There were no vitals filed for this visit.  Subjective Assessment - 06/27/18 1316    Subjective  Reports no changes Still wants to try to walk with [facility], although feels more secure with RW/rollator    Pertinent History  Left THA 05/12/16, right THA 2008, CHF, MI, DM2, HTN, OA, prostate CA, shingles, A-fib, hearing loss, knee sg, recent thorascopy.  05/02/2017- bleeding and began with urinary incontinence    Patient Stated Goals  work on balance, "be able to walk, get off the walker (I'll live with a cane)".  Patient has used the walker since January when he fell.    Currently in Pain?  No/denies                       [facility] Adult  PT Treatment/Exercise - 06/27/18 1356      Ambulation/Gait   Ambulation/Gait Assistance  5: Supervision;4: Min assist    Ambulation/Gait Assistance Details  supervison with cues with rollator (upright posture and less weight via UEs); min assist with SPC    Ambulation Distance (Feet)  120 Feet   200, 120   Assistive device  Rollator;Straight cane    Gait Pattern  Step-through pattern;Decreased stride length;Lateral hip instability;Trunk flexed;Wide base of support    Gait Comments  pt trialed SPC with large quad rubber tip, however with poor technique as he places weight through medial side of tip only; did better with regular SPC      Posture/Postural Control   Posture/Postural  Control  Postural limitations    Postural Limitations  Rounded Shoulders;Forward head;Flexed trunk    Posture Comments  throughout session cues for improved posture to improve balance; pt stands with better posture with SPC rather than rollator      Knee/Hip Exercises: Aerobic   Stepper  6.5 min at up to L5.5--incr resistance for LE strength (1 min warmup and 1 min cooldown)      Knee/Hip Exercises: Machines for Strengthening   Cybex Leg Press  attempted to get onto machine and pt unable           Balance Exercises - 06/27/18 1337      Balance Exercises: Standing   Rockerboard  Anterior/posterior;Intermittent UE support   medium down to small board with no spont hip strategy    Retro Gait  5 reps    Sidestepping  4 reps    Marching Limitations  slow march 1 UE           PT Short Term Goals - 06/24/18 1700      PT SHORT TERM GOAL #1   Title  Patient will complete HEP with supervision (as needed for technique) Target all STGs 07/02/2018    Time  4    Period  Weeks    Status  On-going      PT SHORT TERM GOAL #2   Title  The patient will improve gait speed from 1.46 ft/sec to > or equal to 1.81 ft/sec to indicate lesser fall risk.    Time  4    Period  Weeks    Status  On-going      PT SHORT  TERM GOAL #3   Title  Pateint will improve 5xSTS time to <25 sec (using bil UEs as on evaluation)    Baseline  06/24/18  13.6 sec, bil UEs from chair    Time  4    Period  Weeks    Status  Achieved      PT SHORT TERM GOAL #4   Title  Patient will complete 6 MWT with vital signs stable and distance >=500 ft    Baseline  572 ft in 6 minutes with RW mod indep.    Period  Weeks    Status  Achieved        PT Long Term Goals - 06/03/18 7517      PT LONG TERM GOAL #1   Title  The patient will return demo progression of HEP and verbalize plan for ongoing wellness plan.  (LTGs due date: 08/01/2018)    Target Date  08/01/18      PT LONG TERM GOAL #2   Title  Patient will improve gait velocity to >= 2.4 ft/sec with rollator, indicating lesser fall risk.     Time  8    Period  Weeks    Status  New      PT LONG TERM GOAL #3   Title  The patient will perform 5 time sit<>stand without UE support in < or equal to 20 seconds.    Time  8    Period  Weeks    Status  New      PT LONG TERM GOAL #4   Title  Patient will improve 6 MWT distance (measured at STGs) by 190 ft    Time  8    Period  Weeks    Status  New            Plan - 06/27/18 2059  Clinical Impression Statement  Patient requested to leave slightly early to allow him to get to lab appt. Patient participated in gait training (in // bars with intermittent UE support, with rollator, with SPC) with improved upright posture when using SPC. Balance training shows improvement overall, however continues to have great difficulty using hip strategy (even on rockerboard). Patient can continue to benefit from PT to address decr balance and gait.     PT Frequency  2x / week    PT Duration  8 weeks    PT Treatment/Interventions  ADLs/Self Care Home Management;DME Instruction;Gait training;Stair training;Functional mobility training;Therapeutic exercise;Therapeutic activities;Balance training;Neuromuscular re-education;Patient/family  education;Vestibular;Canalith Repostioning    PT Next Visit Plan  CHECK REMAINING STGS BY 1/11  Upright posture, gait with SPC, balance activities dec'ing UE support, endurance, progress HEP.      PT Home Exercise Plan  [phone]     Consulted and Agree with Plan of Care  Patient       Patient will benefit from skilled therapeutic intervention in order to improve the following deficits and impairments:  Abnormal gait, Cardiopulmonary status limiting activity, Decreased activity tolerance, Decreased balance, Decreased endurance, Decreased knowledge of use of DME, Decreased mobility, Difficulty walking, Decreased strength, Impaired flexibility, Impaired perceived functional ability, Obesity, Postural dysfunction, Pain  Visit Diagnosis: Other abnormalities of gait and mobility  Unsteadiness on feet  Muscle weakness (generalized)     Problem List Patient Active Problem List   Diagnosis Date Noted  . CAD (coronary artery disease) 05/21/2018  . Asthma exacerbation 05/21/2018  . Aspiration pneumonia ([place]) 05/21/2018  . Elevated INR   . Shoulder strain, right, initial encounter   . Acute respiratory failure with hypoxia ([place]) 05/20/2018  . Encounter for therapeutic drug monitoring 12/20/2017  . S/P thoracotomy 11/29/2017  . Sepsis ([place])   . Hypoxemia   . Pneumonia of left lower lobe due to infectious organism ([place])   . Mediastinal lymphadenopathy   . Chronic cough 11/23/2017  . CLL (chronic lymphocytic leukemia) ([place]) 11/23/2017  . Pleural effusion 11/23/2017  . HCAP (healthcare-associated pneumonia) 11/20/2017  . Anemia 11/20/2017  . Ischemic cardiomyopathy 09/01/2017  . Right hip pain   . Acute otitis media   . Chronic systolic CHF (congestive heart failure) ([place])   . Pressure injury of skin 07/20/2017  . Malignant otitis externa 07/19/2017  . Fall at home, initial encounter 07/19/2017  . S/P left THA, AA 05/12/2016  . LPRD (laryngopharyngeal reflux disease) 05/21/2015  .  Other allergic rhinitis 05/21/2015  . Obesity 12/12/2014  . Upper airway cough syndrome 12/11/2014  . Cough variant asthma 07/27/2014  . Wheezing 06/20/2014  . Symptomatic bradycardia - s/p Biotronik (serial number [id number]) 02/08/2014  . Diabetes mellitus type 2, noninsulin dependent ([place])   . Edema of foot 03/06/2013  . Traumatic ecchymosis of right foot 03/06/2013  . Bone spur 12/30/2012  . Pain of toe of left foot 12/30/2012  . Hyperlipidemia 07/13/2012  . HTN (hypertension) 04/12/2012  . Sick sinus syndrome ([place]) 08/17/2011  . PAF (paroxysmal atrial fibrillation) ([place]) 09/16/2010    Rexanne Mano, PT 06/27/2018, 9:04 PM  [facility] [address] [place], [name], [address] Phone: [phone]   Fax:  [phone]  Name: Oscar Baker MRN: [id number] Date of Birth: 08/27/1932

## 2018-06-27 NOTE — Patient Instructions (Signed)
Description   Continue 1 tablet daily except 1.5 tablets on Saturdays. Recheck in 4 weeks.  Call [facility] [phone] with any concerns.

## 2018-06-29 ENCOUNTER — Encounter: Payer: Self-pay | Admitting: Physical Therapy

## 2018-06-29 ENCOUNTER — Ambulatory Visit: Payer: Medicare Other | Admitting: Physical Therapy

## 2018-06-29 DIAGNOSIS — R293 Abnormal posture: Secondary | ICD-10-CM | POA: Diagnosis not present

## 2018-06-29 DIAGNOSIS — R2689 Other abnormalities of gait and mobility: Secondary | ICD-10-CM

## 2018-06-29 DIAGNOSIS — M6281 Muscle weakness (generalized): Secondary | ICD-10-CM

## 2018-06-29 DIAGNOSIS — R296 Repeated falls: Secondary | ICD-10-CM | POA: Diagnosis not present

## 2018-06-29 DIAGNOSIS — R2681 Unsteadiness on feet: Secondary | ICD-10-CM | POA: Diagnosis not present

## 2018-06-29 NOTE — Therapy (Signed)
[facility] [address] [place], [name], [address] Phone: [phone]   Fax:  [phone]  Physical Therapy Treatment  Patient Details  Name: Oscar Baker MRN: [id number] Date of Birth: 1933-06-09 Referring Provider (PT): Marzetta Board, MD (hospitalist) [name], MD (PCP)   Encounter Date: 06/29/2018  PT End of Session - 06/29/18 0939    Visit Number  8    Number of Visits  17    Date for PT Re-Evaluation  07/02/18    Authorization Type  Medicare & BCBS supplement    Authorization Time Period  06/02/18 to 08/31/2018    PT Start Time  0934    PT Stop Time  1015    PT Time Calculation (min)  41 min    Activity Tolerance  Patient tolerated treatment well    Behavior During Therapy  [facility] for tasks assessed/performed       Past Medical History:  Diagnosis Date  . Asthma 1950's   history of  . Atrial fibrillation ([place])   . Bilateral carpal tunnel syndrome   . Bilateral lower extremity edema   . Bladder tumor   . Chronic systolic heart failure (HCC)    Echo 1/19: Mild LVH, EF 45-50, inf HK, MAC, severe LAE, severe RAE // Echo 7/15: Mild LVH, mod focal basal sept hypertrophy, EF 55-60, AV peak and mean 16/9, trivial MR, mod LAE, PASP 38  . CLL (chronic lymphocytic leukemia) [facility]) oncologist-  dr Ilene Qua--   dx [phone] ;  Lymphocytosis, CLL - per lov note 05-11-2017 currently under active survillance,  CT 04-17-2014 show very small lymphadenopathy, no indication for treatment  . Coronary artery disease    cardiologist-  dr Cathie Olden--  08-18-2017 Intermittant risk nuclear study w/ large area of inferior infartion with no evidence ishcemia   . 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 Diabetes mellitus type 2, noninsulin dependent ([place])   . Elevated PSA    since prostatectomy but now resolved  . Hematuria 04/2017  . History of ear infection    Right  . History of MI (myocardial infarction)    per myoview nuclear study 08-18-2017 ,  unknown when  . History of shingles 08/2017   L ear and scalp, possible  . Hyperlipidemia   . Hypertension   . Ischemic cardiomyopathy 09/01/2017   Presumed +CAD with Nuclear stress test 08/18/17 - Inferior scar, no ischemia, intermediate risk // med management unless +angina or worse dyspnea  . OA (osteoarthritis)   . Pacemaker 02/08/2014   followed by dr g. taylor--  single chamber Biotronik due to SSS  . Permanent atrial fibrillation   . Pneumonia 2019   Left lung  . Prostate cancer [facility]) urologist-  dr Diona Fanti   dx 2004--  Gleason 8, PSA 10.45--  11-28-2002  s/p  radical prostatectomy;  recurrent w/ increasing PSA, started ADT treatment  . RBBB (right bundle branch block)   . Sick sinus syndrome ([place])    a-Flutter with episodes of bradycardia; S/P Biotronik (serial number [id number]) 02-08-2014  . Urinary incontinence   . Wears hearing aid in right ear    receiver and transmitter    Past Surgical History:  Procedure Laterality Date  . APPENDECTOMY    . BACK SURGERY     disk  . CARDIAC CATHETERIZATION  09-03-1999  dr Cathie Olden   abnormal cardiolite study:  minor luminal irregularities but no critial coronary artery stenosis  . CARDIOVASCULAR STRESS TEST  08-18-2017  dr Cathie Olden  Intermediate risk nuclear study w/ large area inferior infarction, no evidence of ishcemia (consistant w/ prior MI)/  study not gated due to frequent PVCs  . CARPAL TUNNEL RELEASE Right 2000  . CARPAL TUNNEL RELEASE Left 11/19/2009  . CATARACT EXTRACTION Right 07/2015  . CATARACT EXTRACTION Left 09/2015  . CYSTOSCOPY N/A 11/04/2017   Procedure: CYSTOSCOPY AND CAUTERIZATION OF BLADDER;  Surgeon: Franchot Gallo, MD;  Location: [facility];  Service: Urology;  Laterality: N/A;  . INSERTION PENILE PROSTHESIS  02-22-2004    dr Mattie Marlin  [facility]  . IR THORACENTESIS ASP PLEURAL SPACE W/IMG GUIDE  11/26/2017  . KNEE ARTHROSCOPY Left 07/2010  . PERMANENT PACEMAKER INSERTION N/A 02/08/2014   Procedure:  PERMANENT PACEMAKER INSERTION;  Surgeon: Evans Lance, MD;  Location: [facility] CATH LAB;  Service: Cardiovascular;  Laterality: N/A;  . PLEURAL EFFUSION DRAINAGE Left 11/30/2017   Procedure: DRAINAGE OF HEMOTHORAX;  Surgeon: Ivin Poot, MD;  Location: [facility];  Service: Thoracic;  Laterality: Left;  . RADICAL RETROPUBIC PROSTATECTOMY W/ BILATERAL PELVIC LYMPH NODE DISSECTION  11-28-2002   dr Mattie Marlin  [facility]  . TONSILLECTOMY    . TOTAL HIP ARTHROPLASTY Left 05/12/2016   Procedure: LEFT TOTAL HIP ARTHROPLASTY ANTERIOR APPROACH;  Surgeon: Paralee Cancel, MD;  Location: [facility];  Service: Orthopedics;  Laterality: Left;  . TOTAL HIP ARTHROPLASTY Right 07-15-2006   dr Alvan Dame  [facility]  . TRANSTHORACIC ECHOCARDIOGRAM  07/20/2017   mild LVH, ef 45-50%, hypokinesis of the basal-midinferior myocardium, due to AFib unable to evaluate diastolic function/  severe LAE and RAE/  trivial PR and TR  . VIDEO ASSISTED THORACOSCOPY Left 11/30/2017   Procedure: VIDEO ASSISTED THORACOSCOPY;  Surgeon: Ivin Poot, MD;  Location: [facility];  Service: Thoracic;  Laterality: Left;  [name] VIDEO ASSISTED THORACOSCOPY (VATS)/EMPYEMA Left 11/29/2017   Procedure: VIDEO ASSISTED THORACOSCOPY (VATS)/EMPYEMA;  Surgeon: Ivin Poot, MD;  Location: [facility];  Service: Thoracic;  Laterality: Left;    There were no vitals filed for this visit.  Subjective Assessment - 06/29/18 0936    Subjective  Reports noi changes; he was able to do 10-12 reps of sit to stand from his recliner (uses hands to push up and hands on thighs to sit down).    Pertinent History  Left THA 05/12/16, right THA 2008, CHF, MI, DM2, HTN, OA, prostate CA, shingles, A-fib, hearing loss, knee sg, recent thorascopy.  05/02/2017- bleeding and began with urinary incontinence    Patient Stated Goals  work on balance, "be able to walk, get off the walker (I'll live with a cane)".  Patient has used the walker since January when he fell.                       Pole Ojea  Adult PT Treatment/Exercise - 06/29/18 0943      Ambulation/Gait   Ambulation/Gait Assistance  5: Supervision;4: Min assist    Ambulation/Gait Assistance Details  supervison with cues with rollator (upright posture and less weight via UEs); min assist with SPC    Ambulation Distance (Feet)  100 Feet   120 x 2; 60 x 2   Assistive device  Rollator;Straight cane    Gait Pattern  Step-through pattern;Decreased stride length;Lateral hip instability;Trunk flexed;Wide base of support;Decreased weight shift to left;Right flexed knee in stance;Left flexed knee in stance    Ambulation Surface  Level;Indoor    Gait velocity  32.8/12.87=2.55 ft/sec   with SPC (reg tip) 32.8/17.97=1.83 ft/sec  Ankle Exercises: Stretches   Soleus Stretch  2 reps;20 seconds    Gastroc Stretch  3 reps;30 seconds   standing RLE stretch         Balance Exercises - 06/29/18 1435      Balance Exercises: Standing   SLS  Eyes open;Solid surface;Upper extremity support 1;Upper extremity support 2;Intermittent upper extremity support;10 secs   bil UE support to ipsilateral UE support   Retro Gait  5 reps    Sidestepping  Upper extremity support   2 reps regular; 3 reps with squat when feet apart         PT Short Term Goals - 06/29/18 1449      PT SHORT TERM GOAL #1   Title  Patient will complete HEP with supervision (as needed for technique) Target all STGs 07/02/2018    Time  4    Period  Weeks    Status  Achieved      PT SHORT TERM GOAL #2   Title  The patient will improve gait speed from 1.46 ft/sec (rollator) to > or equal to 1.81 ft/sec to indicate lesser fall risk.    Baseline  06/29/18 2.55 ft/sec rollator    Time  4    Period  Weeks    Status  Achieved      PT SHORT TERM GOAL #3   Title  Pateint will improve 5xSTS time to <25 sec (using bil UEs as on evaluation)    Baseline  06/24/18  13.6 sec, bil UEs from chair    Time  4    Period  Weeks    Status  Achieved      PT SHORT TERM GOAL #4    Title  Patient will complete 6 MWT with vital signs stable and distance >=500 ft    Baseline  572 ft in 6 minutes with RW mod indep.    Period  Weeks    Status  Achieved        PT Long Term Goals - 06/03/18 3016      PT LONG TERM GOAL #1   Title  The patient will return demo progression of HEP and verbalize plan for ongoing wellness plan.  (LTGs due date: 08/01/2018)    Target Date  08/01/18      PT LONG TERM GOAL #2   Title  Patient will improve gait velocity to >= 2.4 ft/sec with rollator, indicating lesser fall risk.     Time  8    Period  Weeks    Status  New      PT LONG TERM GOAL #3   Title  The patient will perform 5 time sit<>stand without UE support in < or equal to 20 seconds.    Time  8    Period  Weeks    Status  New      PT LONG TERM GOAL #4   Title  Patient will improve 6 MWT distance (measured at STGs) by 190 ft    Time  8    Period  Weeks    Status  New            Plan - 06/29/18 1443    Clinical Impression Statement  STGs assessed wtih pt meeting 4 of 4 goals. Skilled session focused on gait training with SPC, balance training, and strength training. Patient demonstrated improved balance and safe use of cane, however can lose balance with sudden stop or head turns. Practiced walking in the hall  with left shoulder near wall and cane in RUE with no LOB. Advised pt he may practice with his cane in this way at home (in his narrow hallway). Pateint can benefit from PT as working towards [place].     PT Frequency  2x / week    PT Duration  8 weeks    PT Treatment/Interventions  ADLs/Self Care Home Management;DME Instruction;Gait training;Stair training;Functional mobility training;Therapeutic exercise;Therapeutic activities;Balance training;Neuromuscular re-education;Patient/family education;Vestibular;Canalith Repostioning    PT Next Visit Plan  Upright posture, gait with SPC, balance activities dec'ing UE support, endurance, progress HEP.      PT Home Exercise  Plan  [phone]     Consulted and Agree with Plan of Care  Patient       Patient will benefit from skilled therapeutic intervention in order to improve the following deficits and impairments:  Abnormal gait, Cardiopulmonary status limiting activity, Decreased activity tolerance, Decreased balance, Decreased endurance, Decreased knowledge of use of DME, Decreased mobility, Difficulty walking, Decreased strength, Impaired flexibility, Impaired perceived functional ability, Obesity, Postural dysfunction, Pain  Visit Diagnosis: Other abnormalities of gait and mobility  Unsteadiness on feet  Muscle weakness (generalized)  Abnormal posture     Problem List Patient Active Problem List   Diagnosis Date Noted  . CAD (coronary artery disease) 05/21/2018  . Asthma exacerbation 05/21/2018  . Aspiration pneumonia ([place]) 05/21/2018  . Elevated INR   . Shoulder strain, right, initial encounter   . Acute respiratory failure with hypoxia ([place]) 05/20/2018  . Encounter for therapeutic drug monitoring 12/20/2017  . S/P thoracotomy 11/29/2017  . Sepsis ([place])   . Hypoxemia   . Pneumonia of left lower lobe due to infectious organism ([place])   . Mediastinal lymphadenopathy   . Chronic cough 11/23/2017  . CLL (chronic lymphocytic leukemia) ([place]) 11/23/2017  . Pleural effusion 11/23/2017  . HCAP (healthcare-associated pneumonia) 11/20/2017  . Anemia 11/20/2017  . Ischemic cardiomyopathy 09/01/2017  . Right hip pain   . Acute otitis media   . Chronic systolic CHF (congestive heart failure) ([place])   . Pressure injury of skin 07/20/2017  . Malignant otitis externa 07/19/2017  . Fall at home, initial encounter 07/19/2017  . S/P left THA, AA 05/12/2016  . LPRD (laryngopharyngeal reflux disease) 05/21/2015  . Other allergic rhinitis 05/21/2015  . Obesity 12/12/2014  . Upper airway cough syndrome 12/11/2014  . Cough variant asthma 07/27/2014  . Wheezing 06/20/2014  . Symptomatic bradycardia - s/p  Biotronik (serial number [id number]) 02/08/2014  . Diabetes mellitus type 2, noninsulin dependent ([place])   . Edema of foot 03/06/2013  . Traumatic ecchymosis of right foot 03/06/2013  . Bone spur 12/30/2012  . Pain of toe of left foot 12/30/2012  . Hyperlipidemia 07/13/2012  . HTN (hypertension) 04/12/2012  . Sick sinus syndrome ([place]) 08/17/2011  . PAF (paroxysmal atrial fibrillation) ([place]) 09/16/2010    Rexanne Mano, PT 06/29/2018, 2:54 PM  [facility] [address] [place], [name], [address] Phone: [phone]   Fax:  [phone]  Name: Oscar Baker MRN: [id number] Date of Birth: 09-Nov-1932

## 2018-06-30 ENCOUNTER — Other Ambulatory Visit: Payer: Self-pay | Admitting: Cardiovascular Disease

## 2018-06-30 DIAGNOSIS — H6121 Impacted cerumen, right ear: Secondary | ICD-10-CM | POA: Diagnosis not present

## 2018-06-30 DIAGNOSIS — H903 Sensorineural hearing loss, bilateral: Secondary | ICD-10-CM | POA: Diagnosis not present

## 2018-07-05 ENCOUNTER — Encounter: Payer: Self-pay | Admitting: Physical Therapy

## 2018-07-05 ENCOUNTER — Ambulatory Visit: Payer: Medicare Other | Admitting: Physical Therapy

## 2018-07-05 DIAGNOSIS — M6281 Muscle weakness (generalized): Secondary | ICD-10-CM

## 2018-07-05 DIAGNOSIS — R2681 Unsteadiness on feet: Secondary | ICD-10-CM

## 2018-07-05 DIAGNOSIS — R2689 Other abnormalities of gait and mobility: Secondary | ICD-10-CM

## 2018-07-05 DIAGNOSIS — R293 Abnormal posture: Secondary | ICD-10-CM | POA: Diagnosis not present

## 2018-07-05 DIAGNOSIS — R296 Repeated falls: Secondary | ICD-10-CM | POA: Diagnosis not present

## 2018-07-05 NOTE — Patient Instructions (Addendum)
Feet Apart, Head Motion - Eyes Closed    Stand with your back to the corner and a chair backwards in front of you. With feet shoulder width apart, close your eyes and hold for 30 sec. Repeat _3___ times per session. Do ___1_ sessions per day.   Feet Partial Heel-Toe, Head Motion - Eyes Open    Stand with your back to the corner and a chair backwards in front of you. With eyes open, right foot partially in front of the other, move head slowly: up and down x 10 and then left to right x 10. Switch and put the other foot forward and do the same. Do __1__ sessions per day.

## 2018-07-06 NOTE — Therapy (Signed)
[facility] [address] [place], [name], [address] Phone: [phone]   Fax:  [phone]  Physical Therapy Treatment  Patient Details  Name: Oscar Baker MRN: [id number] Date of Birth: [birth year] Referring Provider (PT): Marzetta Board, MD (hospitalist) [name], MD (PCP)   Encounter Date: 07/05/2018  PT End of Session - 07/05/18 1700    Visit Number  9    Number of Visits  17    Date for PT Re-Evaluation  08/01/18    Authorization Type  Medicare & BCBS supplement    Authorization Time Period  06/02/18 to 08/31/2018    PT Start Time  1019    PT Stop Time  1107    PT Time Calculation (min)  48 min    Equipment Utilized During Treatment  Gait belt    Activity Tolerance  Patient tolerated treatment well    Behavior During Therapy  [facility] for tasks assessed/performed       Past Medical History:  Diagnosis Date  . Asthma 1950's   history of  . Atrial fibrillation ([place])   . Bilateral carpal tunnel syndrome   . Bilateral lower extremity edema   . Bladder tumor   . Chronic systolic heart failure (HCC)    Echo 1/19: Mild LVH, EF 45-50, inf HK, MAC, severe LAE, severe RAE // Echo 7/15: Mild LVH, mod focal basal sept hypertrophy, EF 55-60, AV peak and mean 16/9, trivial MR, mod LAE, PASP 38  . CLL (chronic lymphocytic leukemia) [facility]) oncologist-  dr Ilene Qua--   dx [phone] ;  Lymphocytosis, CLL - per lov note 05-11-2017 currently under active survillance,  CT 04-17-2014 show very small lymphadenopathy, no indication for treatment  . Coronary artery disease    cardiologist-  dr Cathie Olden--  08-18-2017 Intermittant risk nuclear study w/ large area of inferior infartion with no evidence ishcemia   . 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 Diabetes mellitus type 2, noninsulin dependent ([place])   . Elevated PSA    since prostatectomy but now resolved  . Hematuria 04/2017  . History of ear infection    Right  . History of MI (myocardial  infarction)    per myoview nuclear study 08-18-2017 , unknown when  . History of shingles 08/2017   L ear and scalp, possible  . Hyperlipidemia   . Hypertension   . Ischemic cardiomyopathy 09/01/2017   Presumed +CAD with Nuclear stress test 08/18/17 - Inferior scar, no ischemia, intermediate risk // med management unless +angina or worse dyspnea  . OA (osteoarthritis)   . Pacemaker 02/08/2014   followed by dr g. taylor--  single chamber Biotronik due to SSS  . Permanent atrial fibrillation   . Pneumonia 2019   Left lung  . Prostate cancer [facility]) urologist-  dr Diona Fanti   dx 2004--  Gleason 8, PSA 10.45--  11-28-2002  s/p  radical prostatectomy;  recurrent w/ increasing PSA, started ADT treatment  . RBBB (right bundle branch block)   . Sick sinus syndrome ([place])    a-Flutter with episodes of bradycardia; S/P Biotronik (serial number [id number]) 02-08-2014  . Urinary incontinence   . Wears hearing aid in right ear    receiver and transmitter    Past Surgical History:  Procedure Laterality Date  . APPENDECTOMY    . BACK SURGERY     disk  . CARDIAC CATHETERIZATION  09-03-1999  dr Cathie Olden   abnormal cardiolite study:  minor luminal irregularities but no critial coronary artery stenosis  .  CARDIOVASCULAR STRESS TEST  08-18-2017  dr Cathie Olden   Intermediate risk nuclear study w/ large area inferior infarction, no evidence of ishcemia (consistant w/ prior MI)/  study not gated due to frequent PVCs  . CARPAL TUNNEL RELEASE Right 2000  . CARPAL TUNNEL RELEASE Left 11/19/2009  . CATARACT EXTRACTION Right 07/2015  . CATARACT EXTRACTION Left 09/2015  . CYSTOSCOPY N/A 11/04/2017   Procedure: CYSTOSCOPY AND CAUTERIZATION OF BLADDER;  Surgeon: Franchot Gallo, MD;  Location: [facility];  Service: Urology;  Laterality: N/A;  . INSERTION PENILE PROSTHESIS  02-22-2004    dr Mattie Marlin  [facility]  . IR THORACENTESIS ASP PLEURAL SPACE W/IMG GUIDE  11/26/2017  . KNEE ARTHROSCOPY Left 07/2010  .  PERMANENT PACEMAKER INSERTION N/A 02/08/2014   Procedure: PERMANENT PACEMAKER INSERTION;  Surgeon: Evans Lance, MD;  Location: [facility] CATH LAB;  Service: Cardiovascular;  Laterality: N/A;  . PLEURAL EFFUSION DRAINAGE Left 11/30/2017   Procedure: DRAINAGE OF HEMOTHORAX;  Surgeon: Ivin Poot, MD;  Location: [facility];  Service: Thoracic;  Laterality: Left;  . RADICAL RETROPUBIC PROSTATECTOMY W/ BILATERAL PELVIC LYMPH NODE DISSECTION  11-28-2002   dr Mattie Marlin  [facility]  . TONSILLECTOMY    . TOTAL HIP ARTHROPLASTY Left 05/12/2016   Procedure: LEFT TOTAL HIP ARTHROPLASTY ANTERIOR APPROACH;  Surgeon: Paralee Cancel, MD;  Location: [facility];  Service: Orthopedics;  Laterality: Left;  . TOTAL HIP ARTHROPLASTY Right 07-15-2006   dr Alvan Dame  [facility]  . TRANSTHORACIC ECHOCARDIOGRAM  07/20/2017   mild LVH, ef 45-50%, hypokinesis of the basal-midinferior myocardium, due to AFib unable to evaluate diastolic function/  severe LAE and RAE/  trivial PR and TR  . VIDEO ASSISTED THORACOSCOPY Left 11/30/2017   Procedure: VIDEO ASSISTED THORACOSCOPY;  Surgeon: Ivin Poot, MD;  Location: [facility];  Service: Thoracic;  Laterality: Left;  [name] VIDEO ASSISTED THORACOSCOPY (VATS)/EMPYEMA Left 11/29/2017   Procedure: VIDEO ASSISTED THORACOSCOPY (VATS)/EMPYEMA;  Surgeon: Ivin Poot, MD;  Location: [facility];  Service: Thoracic;  Laterality: Left;    There were no vitals filed for this visit.  Subjective Assessment - 07/05/18 1022    Subjective  Had severe right rib pain for 2 days. ? due to coughing? did not fall or bump into anything.    Pertinent History  Left THA 05/12/16, right THA 2008, CHF, MI, DM2, HTN, OA, prostate CA, shingles, A-fib, hearing loss, knee sg, recent thorascopy.  05/02/2017- bleeding and began with urinary incontinence    Patient Stated Goals  work on balance, "be able to walk, get off the walker (I'll live with a cane)".  Patient has used the walker since January when he fell.    Currently in Pain?  No/denies                        [facility] Adult PT Treatment/Exercise - 07/05/18 0001      Ambulation/Gait   Ambulation/Gait Assistance  5: Supervision;4: Min assist    Ambulation/Gait Assistance Details  superveison with cues for posture and decr wt thru UEs; with cane up to min assist for imbalance    Ambulation Distance (Feet)  100 Feet   40, 150, 50, 50   Assistive device  Rollator;Straight cane    Gait Pattern  Step-through pattern;Decreased stride length;Lateral hip instability;Trunk flexed;Wide base of support;Decreased weight shift to left;Right flexed knee in stance;Left flexed knee in stance    Ambulation Surface  Level;Indoor      Posture/Postural Control  Posture/Postural Control  Postural limitations    Postural Limitations  Rounded Shoulders;Forward head;Flexed trunk    Posture Comments  throughout session cues for improved posture to improve balance; pt stands with better posture with SPC rather than rollator          Balance Exercises - 07/05/18 1700      Balance Exercises: Standing   Standing Eyes Opened  Narrow base of support (BOS);Head turns;Solid surface   narrow partial tandem; horiz, vertical, diagonal   Standing Eyes Closed  Wide (BOA);Solid surface;30 secs    Tandem Stance  Eyes open;Upper extremity support 2;Intermittent upper extremity support;5 reps;10 secs    SLS  Eyes open;Solid surface;Upper extremity support 1;2 reps    Tandem Gait  Upper extremity support;Forward;4 reps    Partial Tandem Stance  Eyes open;Intermittent upper extremity support;3 reps;10 secs;20 secs    Retro Gait  4 reps;Upper extremity support    Sidestepping  4 reps    Marching Limitations  slow march 1 UE         PT Education - 07/05/18 1700    Education Details  additions to HEP and rationale    Person(s) Educated  Patient    Methods  Explanation;Demonstration;Verbal cues;Handout    Comprehension  Verbalized understanding;Returned demonstration;Verbal cues  required;Need further instruction       PT Short Term Goals - 06/29/18 1449      PT SHORT TERM GOAL #1   Title  Patient will complete HEP with supervision (as needed for technique) Target all STGs 07/02/2018    Time  4    Period  Weeks    Status  Achieved      PT SHORT TERM GOAL #2   Title  The patient will improve gait speed from 1.46 ft/sec (rollator) to > or equal to 1.81 ft/sec to indicate lesser fall risk.    Baseline  06/29/18 2.55 ft/sec rollator    Time  4    Period  Weeks    Status  Achieved      PT SHORT TERM GOAL #3   Title  Pateint will improve 5xSTS time to <25 sec (using bil UEs as on evaluation)    Baseline  06/24/18  13.6 sec, bil UEs from chair    Time  4    Period  Weeks    Status  Achieved      PT SHORT TERM GOAL #4   Title  Patient will complete 6 MWT with vital signs stable and distance >=500 ft    Baseline  572 ft in 6 minutes with RW mod indep.    Period  Weeks    Status  Achieved        PT Long Term Goals - 06/03/18 6226      PT LONG TERM GOAL #1   Title  The patient will return demo progression of HEP and verbalize plan for ongoing wellness plan.  (LTGs due date: 08/01/2018)    Target Date  08/01/18      PT LONG TERM GOAL #2   Title  Patient will improve gait velocity to >= 2.4 ft/sec with rollator, indicating lesser fall risk.     Time  8    Period  Weeks    Status  New      PT LONG TERM GOAL #3   Title  The patient will perform 5 time sit<>stand without UE support in < or equal to 20 seconds.    Time  8  Period  Weeks    Status  New      PT LONG TERM GOAL #4   Title  Patient will improve 6 MWT distance (measured at STGs) by 190 ft    Time  8    Period  Weeks    Status  New            Plan - 07/06/18 0446    Clinical Impression Statement  Session focused on balance training (including dynamic EO and static EC) and gait training with SPC. Patient willing to add corner exercises to HEP (initially he was resistant to "too many  exercises") with explanation/rationale. Patient is making progress and continue to work towards LTGs    Rehab Potential  Good    PT Frequency  2x / week    PT Duration  8 weeks    PT Treatment/Interventions  ADLs/Self Care Home Management;DME Instruction;Gait training;Stair training;Functional mobility training;Therapeutic exercise;Therapeutic activities;Balance training;Neuromuscular re-education;Patient/family education;Vestibular;Canalith Repostioning    PT Next Visit Plan  Upright posture, gait with SPC, balance activities dec'ing UE support, endurance,     PT Home Exercise Plan  [phone]     Consulted and Agree with Plan of Care  Patient       Patient will benefit from skilled therapeutic intervention in order to improve the following deficits and impairments:  Abnormal gait, Cardiopulmonary status limiting activity, Decreased activity tolerance, Decreased balance, Decreased endurance, Decreased knowledge of use of DME, Decreased mobility, Difficulty walking, Decreased strength, Impaired flexibility, Impaired perceived functional ability, Obesity, Postural dysfunction, Pain  Visit Diagnosis: Other abnormalities of gait and mobility  Unsteadiness on feet  Muscle weakness (generalized)  Abnormal posture     Problem List Patient Active Problem List   Diagnosis Date Noted  . CAD (coronary artery disease) 05/21/2018  . Asthma exacerbation 05/21/2018  . Aspiration pneumonia ([place]) 05/21/2018  . Elevated INR   . Shoulder strain, right, initial encounter   . Acute respiratory failure with hypoxia ([place]) 05/20/2018  . Encounter for therapeutic drug monitoring 12/20/2017  . S/P thoracotomy 11/29/2017  . Sepsis ([place])   . Hypoxemia   . Pneumonia of left lower lobe due to infectious organism ([place])   . Mediastinal lymphadenopathy   . Chronic cough 11/23/2017  . CLL (chronic lymphocytic leukemia) ([place]) 11/23/2017  . Pleural effusion 11/23/2017  . HCAP (healthcare-associated pneumonia)  11/20/2017  . Anemia 11/20/2017  . Ischemic cardiomyopathy 09/01/2017  . Right hip pain   . Acute otitis media   . Chronic systolic CHF (congestive heart failure) ([place])   . Pressure injury of skin 07/20/2017  . Malignant otitis externa 07/19/2017  . Fall at home, initial encounter 07/19/2017  . S/P left THA, AA 05/12/2016  . LPRD (laryngopharyngeal reflux disease) 05/21/2015  . Other allergic rhinitis 05/21/2015  . Obesity 12/12/2014  . Upper airway cough syndrome 12/11/2014  . Cough variant asthma 07/27/2014  . Wheezing 06/20/2014  . Symptomatic bradycardia - s/p Biotronik (serial number [id number]) 02/08/2014  . Diabetes mellitus type 2, noninsulin dependent ([place])   . Edema of foot 03/06/2013  . Traumatic ecchymosis of right foot 03/06/2013  . Bone spur 12/30/2012  . Pain of toe of left foot 12/30/2012  . Hyperlipidemia 07/13/2012  . HTN (hypertension) 04/12/2012  . Sick sinus syndrome ([place]) 08/17/2011  . PAF (paroxysmal atrial fibrillation) ([place]) 09/16/2010    Rexanne Mano, PT 07/06/2018, 4:49 AM  [facility] [address] [place], [name], [address] Phone: [phone]  Fax:  [phone]  Name: Oscar Baker MRN: [id number] Date of Birth: 06-06-1933

## 2018-07-07 ENCOUNTER — Encounter: Payer: Self-pay | Admitting: Rehabilitative and Restorative Service Providers"

## 2018-07-07 ENCOUNTER — Ambulatory Visit: Payer: Medicare Other | Admitting: Rehabilitative and Restorative Service Providers"

## 2018-07-07 DIAGNOSIS — R296 Repeated falls: Secondary | ICD-10-CM

## 2018-07-07 DIAGNOSIS — R2689 Other abnormalities of gait and mobility: Secondary | ICD-10-CM | POA: Diagnosis not present

## 2018-07-07 DIAGNOSIS — R293 Abnormal posture: Secondary | ICD-10-CM

## 2018-07-07 DIAGNOSIS — M6281 Muscle weakness (generalized): Secondary | ICD-10-CM

## 2018-07-07 DIAGNOSIS — R2681 Unsteadiness on feet: Secondary | ICD-10-CM

## 2018-07-07 NOTE — Therapy (Addendum)
[facility] [address] [place], [name], [address] Phone: [phone]   Fax:  [phone]  Physical Therapy Treatment and Progress note  Patient Details  Name: Oscar Baker MRN: [id number] Date of Birth: 1933-06-21 Referring Provider (PT): Marzetta Board, MD (hospitalist) [name], MD (PCP)   Encounter Date: 07/07/2018  PT End of Session - 07/07/18 1203    Visit Number  10    Number of Visits  17    Date for PT Re-Evaluation  08/01/18    Authorization Type  Medicare & BCBS supplement    Authorization Time Period  06/02/18 to 08/31/2018    PT Start Time  1020    PT Stop Time  1104    PT Time Calculation (min)  44 min    Equipment Utilized During Treatment  Gait belt    Activity Tolerance  Patient tolerated treatment well    Behavior During Therapy  [facility] for tasks assessed/performed       Past Medical History:  Diagnosis Date  . Asthma 1950's   history of  . Atrial fibrillation ([place])   . Bilateral carpal tunnel syndrome   . Bilateral lower extremity edema   . Bladder tumor   . Chronic systolic heart failure (HCC)    Echo 1/19: Mild LVH, EF 45-50, inf HK, MAC, severe LAE, severe RAE // Echo 7/15: Mild LVH, mod focal basal sept hypertrophy, EF 55-60, AV peak and mean 16/9, trivial MR, mod LAE, PASP 38  . CLL (chronic lymphocytic leukemia) [facility]) oncologist-  dr Ilene Qua--   dx [phone] ;  Lymphocytosis, CLL - per lov note 05-11-2017 currently under active survillance,  CT 04-17-2014 show very small lymphadenopathy, no indication for treatment  . Coronary artery disease    cardiologist-  dr Cathie Olden--  08-18-2017 Intermittant risk nuclear study w/ large area of inferior infartion with no evidence ishcemia   . 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 Diabetes mellitus type 2, noninsulin dependent ([place])   . Elevated PSA    since prostatectomy but now resolved  . Hematuria 04/2017  . History of ear infection    Right  . History  of MI (myocardial infarction)    per myoview nuclear study 08-18-2017 , unknown when  . History of shingles 08/2017   L ear and scalp, possible  . Hyperlipidemia   . Hypertension   . Ischemic cardiomyopathy 09/01/2017   Presumed +CAD with Nuclear stress test 08/18/17 - Inferior scar, no ischemia, intermediate risk // med management unless +angina or worse dyspnea  . OA (osteoarthritis)   . Pacemaker 02/08/2014   followed by dr g. taylor--  single chamber Biotronik due to SSS  . Permanent atrial fibrillation   . Pneumonia 2019   Left lung  . Prostate cancer [facility]) urologist-  dr Diona Fanti   dx 2004--  Gleason 8, PSA 10.45--  11-28-2002  s/p  radical prostatectomy;  recurrent w/ increasing PSA, started ADT treatment  . RBBB (right bundle branch block)   . Sick sinus syndrome ([place])    a-Flutter with episodes of bradycardia; S/P Biotronik (serial number [id number]) 02-08-2014  . Urinary incontinence   . Wears hearing aid in right ear    receiver and transmitter    Past Surgical History:  Procedure Laterality Date  . APPENDECTOMY    . BACK SURGERY     disk  . CARDIAC CATHETERIZATION  09-03-1999  dr Cathie Olden   abnormal cardiolite study:  minor luminal irregularities but no critial  coronary artery stenosis  . CARDIOVASCULAR STRESS TEST  08-18-2017  dr Cathie Olden   Intermediate risk nuclear study w/ large area inferior infarction, no evidence of ishcemia (consistant w/ prior MI)/  study not gated due to frequent PVCs  . CARPAL TUNNEL RELEASE Right 2000  . CARPAL TUNNEL RELEASE Left 11/19/2009  . CATARACT EXTRACTION Right 07/2015  . CATARACT EXTRACTION Left 09/2015  . CYSTOSCOPY N/A 11/04/2017   Procedure: CYSTOSCOPY AND CAUTERIZATION OF BLADDER;  Surgeon: Franchot Gallo, MD;  Location: [facility];  Service: Urology;  Laterality: N/A;  . INSERTION PENILE PROSTHESIS  02-22-2004    dr Mattie Marlin  [facility]  . IR THORACENTESIS ASP PLEURAL SPACE W/IMG GUIDE  11/26/2017  . KNEE ARTHROSCOPY  Left 07/2010  . PERMANENT PACEMAKER INSERTION N/A 02/08/2014   Procedure: PERMANENT PACEMAKER INSERTION;  Surgeon: Evans Lance, MD;  Location: [facility] CATH LAB;  Service: Cardiovascular;  Laterality: N/A;  . PLEURAL EFFUSION DRAINAGE Left 11/30/2017   Procedure: DRAINAGE OF HEMOTHORAX;  Surgeon: Ivin Poot, MD;  Location: [place];  Service: Thoracic;  Laterality: Left;  . RADICAL RETROPUBIC PROSTATECTOMY W/ BILATERAL PELVIC LYMPH NODE DISSECTION  11-28-2002   dr Mattie Marlin  [facility]  . TONSILLECTOMY    . TOTAL HIP ARTHROPLASTY Left 05/12/2016   Procedure: LEFT TOTAL HIP ARTHROPLASTY ANTERIOR APPROACH;  Surgeon: Paralee Cancel, MD;  Location: [facility];  Service: Orthopedics;  Laterality: Left;  . TOTAL HIP ARTHROPLASTY Right 07-15-2006   dr Alvan Dame  [facility]  . TRANSTHORACIC ECHOCARDIOGRAM  07/20/2017   mild LVH, ef 45-50%, hypokinesis of the basal-midinferior myocardium, due to AFib unable to evaluate diastolic function/  severe LAE and RAE/  trivial PR and TR  . VIDEO ASSISTED THORACOSCOPY Left 11/30/2017   Procedure: VIDEO ASSISTED THORACOSCOPY;  Surgeon: Ivin Poot, MD;  Location: [place];  Service: Thoracic;  Laterality: Left;  [name] VIDEO ASSISTED THORACOSCOPY (VATS)/EMPYEMA Left 11/29/2017   Procedure: VIDEO ASSISTED THORACOSCOPY (VATS)/EMPYEMA;  Surgeon: Ivin Poot, MD;  Location: [place];  Service: Thoracic;  Laterality: Left;    There were no vitals filed for this visit.  Subjective Assessment - 07/07/18 1017    Subjective  The patient's right rib pain went away, "it must've been a muscle."  "I just about fell yesterday."  He notes he was in a furniture store sitting in a rocker/recliner.  "I just about lost it then and I caught my balance."    Pertinent History  Left THA 05/12/16, right THA 2008, CHF, MI, DM2, HTN, OA, prostate CA, shingles, A-fib, hearing loss, knee sg, recent thorascopy.  05/02/2017- bleeding and began with urinary incontinence    Patient Stated Goals  work on balance, "be able  to walk, get off the walker (I'll live with a cane)".  Patient has used the walker since January when he fell.    Currently in Pain?  No/denies                       [facility] Adult PT Treatment/Exercise - 07/07/18 1027      Ambulation/Gait   Ambulation/Gait  Yes    Ambulation/Gait Assistance  6: Modified independent (Device/Increase time);4: Min guard    Ambulation/Gait Assistance Details  Mod indep with rollator RW and CGA with SPC    Ambulation Distance (Feet)  400 Feet    Assistive device  Rollator;Straight cane    Gait Pattern  Step-through pattern;Decreased stride length;Lateral hip instability;Trunk flexed;Wide base of support;Decreased weight shift to  left;Right flexed knee in stance;Left flexed knee in stance    Ambulation Surface  Level;Indoor    Gait Comments  --      Self-Care   Self-Care  Other Self-Care Comments    Other Self-Care Comments   PT discussed working on gait with SPC in hallway and that I do not anticipate a time that I would recommend the [facility] in the community.   We discussed fall risk with the cane or the walker, but how risk increased with the cane.        Neuro Re-ed    Neuro Re-ed Details   Stepping strategies in standing working on blocked practice of 10 reps R/L stepping forward, side, backwards, and then diagonally backwards with min A.  Then performed alternating R and L sides and adding random directions with increased difficulty (side, then backwards, then forwards stepping).  Patient then performed standing marching with min A x 10 reps.  Sit<.stand with heel raises x 4 reps before fatiguing with min A.  Alternating taps to 4" surface x 10 reps with m in A for safety.  Lateral tapping to 4" box for weight shifting x 10 reps to each side with min A for support.  Then performed standing on 4" block near countertop doing step down laterally to each side x 10 reps with minA and intermittent UE support.               PT Education - 07/07/18  2115    Education Details  fall risk, discussed home program and patient inquired about using a bike.  Discussed benefit of stationary bike for cardio and LE strengthening-- recommended focusing on balance exercises to improve balance.    Person(s) Educated  Patient    Methods  Explanation    Comprehension  Verbalized understanding       PT Short Term Goals - 06/29/18 1449      PT SHORT TERM GOAL #1   Title  Patient will complete HEP with supervision (as needed for technique) Target all STGs 07/02/2018    Time  4    Period  Weeks    Status  Achieved      PT SHORT TERM GOAL #2   Title  The patient will improve gait speed from 1.46 ft/sec (rollator) to > or equal to 1.81 ft/sec to indicate lesser fall risk.    Baseline  06/29/18 2.55 ft/sec rollator    Time  4    Period  Weeks    Status  Achieved      PT SHORT TERM GOAL #3   Title  Pateint will improve 5xSTS time to <25 sec (using bil UEs as on evaluation)    Baseline  06/24/18  13.6 sec, bil UEs from chair    Time  4    Period  Weeks    Status  Achieved      PT SHORT TERM GOAL #4   Title  Patient will complete 6 MWT with vital signs stable and distance >=500 ft    Baseline  572 ft in 6 minutes with RW mod indep.    Period  Weeks    Status  Achieved        PT Long Term Goals - 06/03/18 9030      PT LONG TERM GOAL #1   Title  The patient will return demo progression of HEP and verbalize plan for ongoing wellness plan.  (LTGs due date: 08/01/2018)    Target Date  08/01/18      PT LONG TERM GOAL #2   Title  Patient will improve gait velocity to >= 2.4 ft/sec with rollator, indicating lesser fall risk.     Time  8    Period  Weeks    Status  New      PT LONG TERM GOAL #3   Title  The patient will perform 5 time sit<>stand without UE support in < or equal to 20 seconds.    Time  8    Period  Weeks    Status  New      PT LONG TERM GOAL #4   Title  Patient will improve 6 MWT distance (measured at STGs) by 190 ft    Time  8     Period  Weeks    Status  New            Plan - 07/07/18 2120    Clinical Impression Statement  Today's session focused on dec'ing UE support during stepping strategies working on balance recovery due to fall risk and desire to walk with a SPC.  PT discussed continued use of the RW in the community and using cane when in hallway to practice.  Continue working to LTGs/ sending 10th visit progress note today.     PT Frequency  2x / week    PT Duration  8 weeks    PT Treatment/Interventions  ADLs/Self Care Home Management;DME Instruction;Gait training;Stair training;Functional mobility training;Therapeutic exercise;Therapeutic activities;Balance training;Neuromuscular re-education;Patient/family education;Vestibular;Canalith Repostioning    PT Next Visit Plan  Upright posture, gait with SPC, balance activities dec'ing UE support, endurance, stepping strategies    Consulted and Agree with Plan of Care  Patient       Patient will benefit from skilled therapeutic intervention in order to improve the following deficits and impairments:  Abnormal gait, Cardiopulmonary status limiting activity, Decreased activity tolerance, Decreased balance, Decreased endurance, Decreased knowledge of use of DME, Decreased mobility, Difficulty walking, Decreased strength, Impaired flexibility, Impaired perceived functional ability, Obesity, Postural dysfunction, Pain  Visit Diagnosis: Other abnormalities of gait and mobility  Unsteadiness on feet  Muscle weakness (generalized)  Abnormal posture  Repeated falls    Physical Therapy Progress Note   Dates of Reporting Period: thru 07/07/2018   Objective Reports of Subjective Statement:  Patient is reporting increased mobility in the home and improvement in his balance.   Objective Measurements: see STG assessment above.  Goal Update: see above   Plan: see above.    Reason Skilled Services are Required: Patient is continuing to demo progress with  balance and mobility improving in gait speed, 5 time sit<>stand.  The patient is motivated to participate with therapy.  He continues to be at risk for falls and PT working on progressing mobility to improve safety for household/community ambulation.  Thank you for the referral of this patient. Rudell Cobb, MPT    Problem List Patient Active Problem List   Diagnosis Date Noted  . CAD (coronary artery disease) 05/21/2018  . Asthma exacerbation 05/21/2018  . Aspiration pneumonia ([place]) 05/21/2018  . Elevated INR   . Shoulder strain, right, initial encounter   . Acute respiratory failure with hypoxia ([place]) 05/20/2018  . Encounter for therapeutic drug monitoring 12/20/2017  . S/P thoracotomy 11/29/2017  . Sepsis ([place])   . Hypoxemia   . Pneumonia of left lower lobe due to infectious organism ([place])   . Mediastinal lymphadenopathy   . Chronic cough 11/23/2017  . CLL (chronic lymphocytic leukemia) (  [place]) 11/23/2017  . Pleural effusion 11/23/2017  . HCAP (healthcare-associated pneumonia) 11/20/2017  . Anemia 11/20/2017  . Ischemic cardiomyopathy 09/01/2017  . Right hip pain   . Acute otitis media   . Chronic systolic CHF (congestive heart failure) ([place])   . Pressure injury of skin 07/20/2017  . Malignant otitis externa 07/19/2017  . Fall at home, initial encounter 07/19/2017  . S/P left THA, AA 05/12/2016  . LPRD (laryngopharyngeal reflux disease) 05/21/2015  . Other allergic rhinitis 05/21/2015  . Obesity 12/12/2014  . Upper airway cough syndrome 12/11/2014  . Cough variant asthma 07/27/2014  . Wheezing 06/20/2014  . Symptomatic bradycardia - s/p Biotronik (serial number [id number]) 02/08/2014  . Diabetes mellitus type 2, noninsulin dependent ([place])   . Edema of foot 03/06/2013  . Traumatic ecchymosis of right foot 03/06/2013  . Bone spur 12/30/2012  . Pain of toe of left foot 12/30/2012  . Hyperlipidemia 07/13/2012  . HTN (hypertension) 04/12/2012  . Sick sinus syndrome  ([place]) 08/17/2011  . PAF (paroxysmal atrial fibrillation) ([place]) 09/16/2010    Quin Mathenia, PT 07/07/2018, 9:32 PM  [facility] [address] [place], [name], [address] Phone: [phone]   Fax:  [phone]  Name: Oscar Baker MRN: [id number] Date of Birth: 1933/02/23

## 2018-07-11 ENCOUNTER — Ambulatory Visit (INDEPENDENT_AMBULATORY_CARE_PROVIDER_SITE_OTHER): Payer: Medicare Other

## 2018-07-11 DIAGNOSIS — I495 Sick sinus syndrome: Secondary | ICD-10-CM | POA: Diagnosis not present

## 2018-07-11 DIAGNOSIS — C61 Malignant neoplasm of prostate: Secondary | ICD-10-CM | POA: Diagnosis not present

## 2018-07-12 ENCOUNTER — Encounter: Payer: Self-pay | Admitting: Physical Therapy

## 2018-07-12 ENCOUNTER — Ambulatory Visit: Payer: Medicare Other | Admitting: Physical Therapy

## 2018-07-12 DIAGNOSIS — R2681 Unsteadiness on feet: Secondary | ICD-10-CM

## 2018-07-12 DIAGNOSIS — R2689 Other abnormalities of gait and mobility: Secondary | ICD-10-CM

## 2018-07-12 DIAGNOSIS — R293 Abnormal posture: Secondary | ICD-10-CM | POA: Diagnosis not present

## 2018-07-12 DIAGNOSIS — R296 Repeated falls: Secondary | ICD-10-CM | POA: Diagnosis not present

## 2018-07-12 DIAGNOSIS — M6281 Muscle weakness (generalized): Secondary | ICD-10-CM | POA: Diagnosis not present

## 2018-07-12 NOTE — Progress Notes (Signed)
Remote pacemaker transmission.   

## 2018-07-13 LAB — CUP PACEART REMOTE DEVICE CHECK
Date Time Interrogation Session: 20200122203753
Implantable Lead Implant Date: 20150820
Implantable Lead Location: 753860
Implantable Lead Serial Number: 29625633
Implantable Pulse Generator Implant Date: 20150820
Pulse Gen Model: 394934
Pulse Gen Serial Number: 68372291

## 2018-07-13 NOTE — Therapy (Signed)
[facility] [address] [place], [name], [address] Phone: [phone]   Fax:  [phone]  Physical Therapy Treatment  Patient Details  Name: Oscar Baker MRN: [id number] Date of Birth: [birth year] Referring Provider (PT): Marzetta Board, MD (hospitalist) [name], MD (PCP)   Encounter Date: 07/12/2018  PT End of Session - 07/12/18 1700    Visit Number  11    Number of Visits  17    Date for PT Re-Evaluation  08/01/18    Authorization Type  Medicare & BCBS supplement    Authorization Time Period  06/02/18 to 08/31/2018    PT Start Time  1107    PT Stop Time  1150    PT Time Calculation (min)  43 min    Equipment Utilized During Treatment  Gait belt    Activity Tolerance  Patient tolerated treatment well    Behavior During Therapy  [facility] for tasks assessed/performed       Past Medical History:  Diagnosis Date  . Asthma 1950's   history of  . Atrial fibrillation ([place])   . Bilateral carpal tunnel syndrome   . Bilateral lower extremity edema   . Bladder tumor   . Chronic systolic heart failure (HCC)    Echo 1/19: Mild LVH, EF 45-50, inf HK, MAC, severe LAE, severe RAE // Echo 7/15: Mild LVH, mod focal basal sept hypertrophy, EF 55-60, AV peak and mean 16/9, trivial MR, mod LAE, PASP 38  . CLL (chronic lymphocytic leukemia) [facility]) oncologist-  dr Ilene Qua--   dx [phone] ;  Lymphocytosis, CLL - per lov note 05-11-2017 currently under active survillance,  CT 04-17-2014 show very small lymphadenopathy, no indication for treatment  . Coronary artery disease    cardiologist-  dr Cathie Olden--  08-18-2017 Intermittant risk nuclear study w/ large area of inferior infartion with no evidence ishcemia   . 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 Diabetes mellitus type 2, noninsulin dependent ([place])   . Elevated PSA    since prostatectomy but now resolved  . Hematuria 04/2017  . History of ear infection    Right  . History of MI (myocardial  infarction)    per myoview nuclear study 08-18-2017 , unknown when  . History of shingles 08/2017   L ear and scalp, possible  . Hyperlipidemia   . Hypertension   . Ischemic cardiomyopathy 09/01/2017   Presumed +CAD with Nuclear stress test 08/18/17 - Inferior scar, no ischemia, intermediate risk // med management unless +angina or worse dyspnea  . OA (osteoarthritis)   . Pacemaker 02/08/2014   followed by dr g. taylor--  single chamber Biotronik due to SSS  . Permanent atrial fibrillation   . Pneumonia 2019   Left lung  . Prostate cancer [facility]) urologist-  dr Diona Fanti   dx 2004--  Gleason 8, PSA 10.45--  11-28-2002  s/p  radical prostatectomy;  recurrent w/ increasing PSA, started ADT treatment  . RBBB (right bundle branch block)   . Sick sinus syndrome ([place])    a-Flutter with episodes of bradycardia; S/P Biotronik (serial number [id number]) 02-08-2014  . Urinary incontinence   . Wears hearing aid in right ear    receiver and transmitter    Past Surgical History:  Procedure Laterality Date  . APPENDECTOMY    . BACK SURGERY     disk  . CARDIAC CATHETERIZATION  09-03-1999  dr Cathie Olden   abnormal cardiolite study:  minor luminal irregularities but no critial coronary artery stenosis  .  CARDIOVASCULAR STRESS TEST  08-18-2017  dr Cathie Olden   Intermediate risk nuclear study w/ large area inferior infarction, no evidence of ishcemia (consistant w/ prior MI)/  study not gated due to frequent PVCs  . CARPAL TUNNEL RELEASE Right 2000  . CARPAL TUNNEL RELEASE Left 11/19/2009  . CATARACT EXTRACTION Right 07/2015  . CATARACT EXTRACTION Left 09/2015  . CYSTOSCOPY N/A 11/04/2017   Procedure: CYSTOSCOPY AND CAUTERIZATION OF BLADDER;  Surgeon: Franchot Gallo, MD;  Location: [facility];  Service: Urology;  Laterality: N/A;  . INSERTION PENILE PROSTHESIS  02-22-2004    dr Mattie Marlin  [facility]  . IR THORACENTESIS ASP PLEURAL SPACE W/IMG GUIDE  11/26/2017  . KNEE ARTHROSCOPY Left 07/2010  .  PERMANENT PACEMAKER INSERTION N/A 02/08/2014   Procedure: PERMANENT PACEMAKER INSERTION;  Surgeon: Evans Lance, MD;  Location: [facility] CATH LAB;  Service: Cardiovascular;  Laterality: N/A;  . PLEURAL EFFUSION DRAINAGE Left 11/30/2017   Procedure: DRAINAGE OF HEMOTHORAX;  Surgeon: Ivin Poot, MD;  Location: [facility];  Service: Thoracic;  Laterality: Left;  . RADICAL RETROPUBIC PROSTATECTOMY W/ BILATERAL PELVIC LYMPH NODE DISSECTION  11-28-2002   dr Mattie Marlin  [facility]  . TONSILLECTOMY    . TOTAL HIP ARTHROPLASTY Left 05/12/2016   Procedure: LEFT TOTAL HIP ARTHROPLASTY ANTERIOR APPROACH;  Surgeon: Paralee Cancel, MD;  Location: [facility];  Service: Orthopedics;  Laterality: Left;  . TOTAL HIP ARTHROPLASTY Right 07-15-2006   dr Alvan Dame  [facility]  . TRANSTHORACIC ECHOCARDIOGRAM  07/20/2017   mild LVH, ef 45-50%, hypokinesis of the basal-midinferior myocardium, due to AFib unable to evaluate diastolic function/  severe LAE and RAE/  trivial PR and TR  . VIDEO ASSISTED THORACOSCOPY Left 11/30/2017   Procedure: VIDEO ASSISTED THORACOSCOPY;  Surgeon: Ivin Poot, MD;  Location: [facility];  Service: Thoracic;  Laterality: Left;  [name] VIDEO ASSISTED THORACOSCOPY (VATS)/EMPYEMA Left 11/29/2017   Procedure: VIDEO ASSISTED THORACOSCOPY (VATS)/EMPYEMA;  Surgeon: Ivin Poot, MD;  Location: [facility];  Service: Thoracic;  Laterality: Left;    There were no vitals filed for this visit.  Subjective Assessment - 07/12/18 1108    Subjective  If it weren't for the counter, I couldn't do the SLS. Practiced with his cane in the hallway at home.     Pertinent History  Left THA 05/12/16, right THA 2008, CHF, MI, DM2, HTN, OA, prostate CA, shingles, A-fib, hearing loss, knee sg, recent thorascopy.  05/02/2017- bleeding and began with urinary incontinence    Patient Stated Goals  work on balance, "be able to walk, get off the walker (I'll live with a cane)".  Patient has used the walker since January when he fell.    Currently in Pain?   No/denies                       [facility] Adult PT Treatment/Exercise - 07/12/18 1130      Ambulation/Gait   Ambulation/Gait Assistance  4: Min assist    Ambulation/Gait Assistance Details  with cane with LOB to the right x 1 required assist to recover    Ambulation Distance (Feet)  200 Feet   x2; plus between stations in gym   Assistive device  Straight cane    Gait Pattern  Step-through pattern;Decreased stride length;Lateral hip instability;Trunk flexed;Wide base of support;Decreased weight shift to left;Right flexed knee in stance;Left flexed knee in stance    Ambulation Surface  Level;Indoor          Balance  Exercises - 07/12/18 1700      Balance Exercises: Standing   Standing Eyes Opened  Narrow base of support (BOS);Head turns;Foam/compliant surface;Solid surface    Standing Eyes Closed  Narrow base of support (BOS);Wide (BOA);Head turns;Foam/compliant surface;Solid surface    Gait with Head Turns  Forward;Upper extremity support;4 reps   in hallway with cane   Retro Gait  4 reps   // bars, no UE support; alternate with fwd walk no UE suppor   Sidestepping  4 reps          PT Short Term Goals - 06/29/18 1449      PT SHORT TERM GOAL #1   Title  Patient will complete HEP with supervision (as needed for technique) Target all STGs 07/02/2018    Time  4    Period  Weeks    Status  Achieved      PT SHORT TERM GOAL #2   Title  The patient will improve gait speed from 1.46 ft/sec (rollator) to > or equal to 1.81 ft/sec to indicate lesser fall risk.    Baseline  06/29/18 2.55 ft/sec rollator    Time  4    Period  Weeks    Status  Achieved      PT SHORT TERM GOAL #3   Title  Pateint will improve 5xSTS time to <25 sec (using bil UEs as on evaluation)    Baseline  06/24/18  13.6 sec, bil UEs from chair    Time  4    Period  Weeks    Status  Achieved      PT SHORT TERM GOAL #4   Title  Patient will complete 6 MWT with vital signs stable and distance >=500 ft     Baseline  572 ft in 6 minutes with RW mod indep.    Period  Weeks    Status  Achieved        PT Long Term Goals - 06/03/18 1610      PT LONG TERM GOAL #1   Title  The patient will return demo progression of HEP and verbalize plan for ongoing wellness plan.  (LTGs due date: 08/01/2018)    Target Date  08/01/18      PT LONG TERM GOAL #2   Title  Patient will improve gait velocity to >= 2.4 ft/sec with rollator, indicating lesser fall risk.     Time  8    Period  Weeks    Status  New      PT LONG TERM GOAL #3   Title  The patient will perform 5 time sit<>stand without UE support in < or equal to 20 seconds.    Time  8    Period  Weeks    Status  New      PT LONG TERM GOAL #4   Title  Patient will improve 6 MWT distance (measured at STGs) by 190 ft    Time  8    Period  Weeks    Status  New            Plan - 07/12/18 1700    Clinical Impression Statement  Focus on gait training with SPC and incorporating head turns. Also updating his corner balance exercises as pt reported they had become easy. He has significantly improved and able to add thin pillow to stand on during exercises with appropriate degree of challenge. Patient continues to progress and can benefit from cont'dPT.    PT Frequency  2x / week    PT Duration  8 weeks    PT Treatment/Interventions  ADLs/Self Care Home Management;DME Instruction;Gait training;Stair training;Functional mobility training;Therapeutic exercise;Therapeutic activities;Balance training;Neuromuscular re-education;Patient/family education;Vestibular;Canalith Repostioning    PT Next Visit Plan  Upright posture, gait with SPC (including head turns, sudden stops, turns), dynamic balance activities dec'ing UE support, endurance, stepping strategies    PT Home Exercise Plan  [id number]     Consulted and Agree with Plan of Care  Patient       Patient will benefit from skilled therapeutic intervention in order to improve the following deficits  and impairments:  Abnormal gait, Cardiopulmonary status limiting activity, Decreased activity tolerance, Decreased balance, Decreased endurance, Decreased knowledge of use of DME, Decreased mobility, Difficulty walking, Decreased strength, Impaired flexibility, Impaired perceived functional ability, Obesity, Postural dysfunction, Pain  Visit Diagnosis: Other abnormalities of gait and mobility  Unsteadiness on feet     Problem List Patient Active Problem List   Diagnosis Date Noted  . CAD (coronary artery disease) 05/21/2018  . Asthma exacerbation 05/21/2018  . Aspiration pneumonia ([place]) 05/21/2018  . Elevated INR   . Shoulder strain, right, initial encounter   . Acute respiratory failure with hypoxia ([place]) 05/20/2018  . Encounter for therapeutic drug monitoring 12/20/2017  . S/P thoracotomy 11/29/2017  . Sepsis ([place])   . Hypoxemia   . Pneumonia of left lower lobe due to infectious organism ([place])   . Mediastinal lymphadenopathy   . Chronic cough 11/23/2017  . CLL (chronic lymphocytic leukemia) ([place]) 11/23/2017  . Pleural effusion 11/23/2017  . HCAP (healthcare-associated pneumonia) 11/20/2017  . Anemia 11/20/2017  . Ischemic cardiomyopathy 09/01/2017  . Right hip pain   . Acute otitis media   . Chronic systolic CHF (congestive heart failure) ([place])   . Pressure injury of skin 07/20/2017  . Malignant otitis externa 07/19/2017  . Fall at home, initial encounter 07/19/2017  . S/P left THA, AA 05/12/2016  . LPRD (laryngopharyngeal reflux disease) 05/21/2015  . Other allergic rhinitis 05/21/2015  . Obesity 12/12/2014  . Upper airway cough syndrome 12/11/2014  . Cough variant asthma 07/27/2014  . Wheezing 06/20/2014  . Symptomatic bradycardia - s/p Biotronik (serial number [id number]) 02/08/2014  . Diabetes mellitus type 2, noninsulin dependent ([place])   . Edema of foot 03/06/2013  . Traumatic ecchymosis of right foot 03/06/2013  . Bone spur 12/30/2012  . Pain of toe of left foot  12/30/2012  . Hyperlipidemia 07/13/2012  . HTN (hypertension) 04/12/2012  . Sick sinus syndrome ([place]) 08/17/2011  . PAF (paroxysmal atrial fibrillation) ([place]) 09/16/2010    Rexanne Mano, PT 07/13/2018, 9:12 AM  [facility] [address] [place], [name], [address] Phone: [phone]   Fax:  [phone]  Name: Oscar Baker MRN: [id number] Date of Birth: 1932-08-15

## 2018-07-14 ENCOUNTER — Ambulatory Visit: Payer: Medicare Other | Admitting: Physical Therapy

## 2018-07-14 ENCOUNTER — Encounter: Payer: Self-pay | Admitting: Physical Therapy

## 2018-07-14 DIAGNOSIS — R2689 Other abnormalities of gait and mobility: Secondary | ICD-10-CM

## 2018-07-14 DIAGNOSIS — M6281 Muscle weakness (generalized): Secondary | ICD-10-CM

## 2018-07-14 DIAGNOSIS — R293 Abnormal posture: Secondary | ICD-10-CM | POA: Diagnosis not present

## 2018-07-14 DIAGNOSIS — R2681 Unsteadiness on feet: Secondary | ICD-10-CM | POA: Diagnosis not present

## 2018-07-14 DIAGNOSIS — R296 Repeated falls: Secondary | ICD-10-CM | POA: Diagnosis not present

## 2018-07-14 NOTE — Therapy (Signed)
[facility] [address] [place], [name], [address] Phone: [phone]   Fax:  [phone]  Physical Therapy Treatment  Patient Details  Name: Oscar Baker MRN: [id number] Date of Birth: 01-11-1933 Referring Provider (PT): Marzetta Board, MD (hospitalist) [name], MD (PCP)   Encounter Date: 07/14/2018  PT End of Session - 07/14/18 0916    Visit Number  12    Number of Visits  17    Date for PT Re-Evaluation  08/01/18    Authorization Type  Medicare & BCBS supplement    Authorization Time Period  06/02/18 to 08/31/2018    PT Start Time  0918    PT Stop Time  1004    PT Time Calculation (min)  46 min    Equipment Utilized During Treatment  Gait belt    Activity Tolerance  Patient tolerated treatment well    Behavior During Therapy  [facility] for tasks assessed/performed       Past Medical History:  Diagnosis Date  . Asthma 1950's   history of  . Atrial fibrillation ([place])   . Bilateral carpal tunnel syndrome   . Bilateral lower extremity edema   . Bladder tumor   . Chronic systolic heart failure (HCC)    Echo 1/19: Mild LVH, EF 45-50, inf HK, MAC, severe LAE, severe RAE // Echo 7/15: Mild LVH, mod focal basal sept hypertrophy, EF 55-60, AV peak and mean 16/9, trivial MR, mod LAE, PASP 38  . CLL (chronic lymphocytic leukemia) [facility]) oncologist-  dr Ilene Qua--   dx [phone] ;  Lymphocytosis, CLL - per lov note 05-11-2017 currently under active survillance,  CT 04-17-2014 show very small lymphadenopathy, no indication for treatment  . Coronary artery disease    cardiologist-  dr Cathie Olden--  08-18-2017 Intermittant risk nuclear study w/ large area of inferior infartion with no evidence ishcemia   . 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 Diabetes mellitus type 2, noninsulin dependent ([place])   . Elevated PSA    since prostatectomy but now resolved  . Hematuria 04/2017  . History of ear infection    Right  . History of MI (myocardial  infarction)    per myoview nuclear study 08-18-2017 , unknown when  . History of shingles 08/2017   L ear and scalp, possible  . Hyperlipidemia   . Hypertension   . Ischemic cardiomyopathy 09/01/2017   Presumed +CAD with Nuclear stress test 08/18/17 - Inferior scar, no ischemia, intermediate risk // med management unless +angina or worse dyspnea  . OA (osteoarthritis)   . Pacemaker 02/08/2014   followed by dr g. taylor--  single chamber Biotronik due to SSS  . Permanent atrial fibrillation   . Pneumonia 2019   Left lung  . Prostate cancer [facility]) urologist-  dr Diona Fanti   dx 2004--  Gleason 8, PSA 10.45--  11-28-2002  s/p  radical prostatectomy;  recurrent w/ increasing PSA, started ADT treatment  . RBBB (right bundle branch block)   . Sick sinus syndrome ([place])    a-Flutter with episodes of bradycardia; S/P Biotronik (serial number [id number]) 02-08-2014  . Urinary incontinence   . Wears hearing aid in right ear    receiver and transmitter    Past Surgical History:  Procedure Laterality Date  . APPENDECTOMY    . BACK SURGERY     disk  . CARDIAC CATHETERIZATION  09-03-1999  dr Cathie Olden   abnormal cardiolite study:  minor luminal irregularities but no critial coronary artery stenosis  .  CARDIOVASCULAR STRESS TEST  08-18-2017  dr Cathie Olden   Intermediate risk nuclear study w/ large area inferior infarction, no evidence of ishcemia (consistant w/ prior MI)/  study not gated due to frequent PVCs  . CARPAL TUNNEL RELEASE Right 2000  . CARPAL TUNNEL RELEASE Left 11/19/2009  . CATARACT EXTRACTION Right 07/2015  . CATARACT EXTRACTION Left 09/2015  . CYSTOSCOPY N/A 11/04/2017   Procedure: CYSTOSCOPY AND CAUTERIZATION OF BLADDER;  Surgeon: Franchot Gallo, MD;  Location: [facility];  Service: Urology;  Laterality: N/A;  . INSERTION PENILE PROSTHESIS  02-22-2004    dr Mattie Marlin  [facility]  . IR THORACENTESIS ASP PLEURAL SPACE W/IMG GUIDE  11/26/2017  . KNEE ARTHROSCOPY Left 07/2010  .  PERMANENT PACEMAKER INSERTION N/A 02/08/2014   Procedure: PERMANENT PACEMAKER INSERTION;  Surgeon: Evans Lance, MD;  Location: [facility] CATH LAB;  Service: Cardiovascular;  Laterality: N/A;  . PLEURAL EFFUSION DRAINAGE Left 11/30/2017   Procedure: DRAINAGE OF HEMOTHORAX;  Surgeon: Ivin Poot, MD;  Location: [facility];  Service: Thoracic;  Laterality: Left;  . RADICAL RETROPUBIC PROSTATECTOMY W/ BILATERAL PELVIC LYMPH NODE DISSECTION  11-28-2002   dr Mattie Marlin  [facility]  . TONSILLECTOMY    . TOTAL HIP ARTHROPLASTY Left 05/12/2016   Procedure: LEFT TOTAL HIP ARTHROPLASTY ANTERIOR APPROACH;  Surgeon: Paralee Cancel, MD;  Location: [facility];  Service: Orthopedics;  Laterality: Left;  . TOTAL HIP ARTHROPLASTY Right 07-15-2006   dr Alvan Dame  [facility]  . TRANSTHORACIC ECHOCARDIOGRAM  07/20/2017   mild LVH, ef 45-50%, hypokinesis of the basal-midinferior myocardium, due to AFib unable to evaluate diastolic function/  severe LAE and RAE/  trivial PR and TR  . VIDEO ASSISTED THORACOSCOPY Left 11/30/2017   Procedure: VIDEO ASSISTED THORACOSCOPY;  Surgeon: Ivin Poot, MD;  Location: [facility];  Service: Thoracic;  Laterality: Left;  [name] VIDEO ASSISTED THORACOSCOPY (VATS)/EMPYEMA Left 11/29/2017   Procedure: VIDEO ASSISTED THORACOSCOPY (VATS)/EMPYEMA;  Surgeon: Ivin Poot, MD;  Location: [facility];  Service: Thoracic;  Laterality: Left;    There were no vitals filed for this visit.  Subjective Assessment - 07/14/18 0950    Subjective  Reports he is still seeing double at times. More at night and in "certain lighting." Compensates by closing one eye when it occurs. Thinks it started when he hit his head. Talked to his doctor about this and is going in for vision testing next week (at his PCP's office)    Pertinent History  Left THA 05/12/16, right THA 2008, CHF, MI, DM2, HTN, OA, prostate CA, shingles, A-fib, hearing loss, knee sg, recent thorascopy.  05/02/2017- bleeding and began with urinary incontinence    Patient Stated  Goals  work on balance, "be able to walk, get off the walker (I'll live with a cane)".  Patient has used the walker since January when he fell.    Currently in Pain?  No/denies             Vestibular Assessment - 07/14/18 1009      Occulomotor Exam   Occulomotor Alignment  Normal    Smooth Pursuits  Saccades    Comment  now sees double in left field of vision (with head still and tracking pen); single vision in rt field of vision; convergence WNL      Vestibulo-Occular Reflex   VOR to Slow Head Movement  --   pen appears to move each time he is using left field of visi  [facility] Adult PT Treatment/Exercise - 07/14/18 1019      Ambulation/Gait   Ambulation/Gait Assistance  4: Min assist    Ambulation/Gait Assistance Details  with SPC regular tip; pt with wider stance than most recent session and required assist x 1 to recover LOB to his left; vc for cane placement    Ambulation Distance (Feet)  80 Feet   180, 45   Assistive device  Straight cane    Gait Pattern  Step-through pattern;Decreased stride length;Lateral hip instability;Trunk flexed;Wide base of support;Decreased weight shift to left;Right flexed knee in stance;Left flexed knee in stance    Ramp  4: Min assist    Ramp Details (indicate cue type and reason)  x2; ascends safely with SPC and minguard assist; 1st rep descending pt with forward lean and accelerating steps (despite PT cuing pt prior to starting down ramp to "keep weight on his heels." 2nd rep descending much more controlled "now I see what you mean"      Posture/Postural Control   Posture/Postural Control  Postural limitations    Postural Limitations  Rounded Shoulders;Forward head;Flexed trunk    Posture Comments  throughout session cues for improved posture to improve balance; pt stands with better posture with SPC rather than rollator      Knee/Hip Exercises: Aerobic   Nustep  L2 x 6 minutes for LE endurance (while discussing/updating  HEP)          Balance Exercises - 07/14/18 1012      Balance Exercises: Standing   SLS  Eyes open;Upper extremity support 1;25 secs;20 secs   ipsilateral UE support; most difficult on LLE   Stepping Strategy  Anterior;Posterior;UE support   // bars with pt intermittently needing support to maintain b   Retro Gait  --   // bars, no UEs; initially with incr'g speed and LOB & assis   Sidestepping  4 reps          PT Short Term Goals - 06/29/18 1449      PT SHORT TERM GOAL #1   Title  Patient will complete HEP with supervision (as needed for technique) Target all STGs 07/02/2018    Time  4    Period  Weeks    Status  Achieved      PT SHORT TERM GOAL #2   Title  The patient will improve gait speed from 1.46 ft/sec (rollator) to > or equal to 1.81 ft/sec to indicate lesser fall risk.    Baseline  06/29/18 2.55 ft/sec rollator    Time  4    Period  Weeks    Status  Achieved      PT SHORT TERM GOAL #3   Title  Pateint will improve 5xSTS time to <25 sec (using bil UEs as on evaluation)    Baseline  06/24/18  13.6 sec, bil UEs from chair    Time  4    Period  Weeks    Status  Achieved      PT SHORT TERM GOAL #4   Title  Patient will complete 6 MWT with vital signs stable and distance >=500 ft    Baseline  572 ft in 6 minutes with RW mod indep.    Period  Weeks    Status  Achieved        PT Long Term Goals - 06/03/18 0932      PT LONG TERM GOAL #1   Title  The patient will return demo progression of HEP and  verbalize plan for ongoing wellness plan.  (LTGs due date: 08/01/2018)    Target Date  08/01/18      PT LONG TERM GOAL #2   Title  Patient will improve gait velocity to >= 2.4 ft/sec with rollator, indicating lesser fall risk.     Time  8    Period  Weeks    Status  New      PT LONG TERM GOAL #3   Title  The patient will perform 5 time sit<>stand without UE support in < or equal to 20 seconds.    Time  8    Period  Weeks    Status  New      PT LONG TERM GOAL  #4   Title  Patient will improve 6 MWT distance (measured at STGs) by 190 ft    Time  8    Period  Weeks    Status  New            Plan - 07/14/18 1026    Clinical Impression Statement  Session initially focused on gait training with SPC (including ramp), dynamic balance training in // bars, and stepping strategies for balance recovery (pt did poorly with forward stepping due to forward head; improved with posture cues). Patient mentioned he continues to have double vision (found to be only in his LEFT field of vision) and noted in PT episode mid-2019 that pt was found to have +head impulse test and attemped VOR x1 exercises. He was unable to progress these exercises at that time due to the double-vision. He had bil cataract surgeries with hopes it would resolve and it has not. He has an appt with his physician to address the double-vision. He denies dizziness or imbalance due to this. Will continue with PT POC.    PT Frequency  2x / week    PT Duration  8 weeks    PT Treatment/Interventions  ADLs/Self Care Home Management;DME Instruction;Gait training;Stair training;Functional mobility training;Therapeutic exercise;Therapeutic activities;Balance training;Neuromuscular re-education;Patient/family education;Vestibular;Canalith Repostioning    PT Next Visit Plan  ?revisit double vision in LEFT field of vision; Upright posture, gait with SPC (including head turns, sudden stops, turns), dynamic balance activities dec'ing UE support, endurance, stepping strategies    PT Home Exercise Plan  [phone]     Consulted and Agree with Plan of Care  Patient       Patient will benefit from skilled therapeutic intervention in order to improve the following deficits and impairments:  Abnormal gait, Cardiopulmonary status limiting activity, Decreased activity tolerance, Decreased balance, Decreased endurance, Decreased knowledge of use of DME, Decreased mobility, Difficulty walking, Decreased strength,  Impaired flexibility, Impaired perceived functional ability, Obesity, Postural dysfunction, Pain  Visit Diagnosis: Other abnormalities of gait and mobility  Unsteadiness on feet  Muscle weakness (generalized)  Abnormal posture     Problem List Patient Active Problem List   Diagnosis Date Noted  . CAD (coronary artery disease) 05/21/2018  . Asthma exacerbation 05/21/2018  . Aspiration pneumonia ([place]) 05/21/2018  . Elevated INR   . Shoulder strain, right, initial encounter   . Acute respiratory failure with hypoxia ([place]) 05/20/2018  . Encounter for therapeutic drug monitoring 12/20/2017  . S/P thoracotomy 11/29/2017  . Sepsis ([place])   . Hypoxemia   . Pneumonia of left lower lobe due to infectious organism ([place])   . Mediastinal lymphadenopathy   . Chronic cough 11/23/2017  . CLL (chronic lymphocytic leukemia) ([place]) 11/23/2017  . Pleural effusion 11/23/2017  . HCAP (healthcare-associated  pneumonia) 11/20/2017  . Anemia 11/20/2017  . Ischemic cardiomyopathy 09/01/2017  . Right hip pain   . Acute otitis media   . Chronic systolic CHF (congestive heart failure) ([place])   . Pressure injury of skin 07/20/2017  . Malignant otitis externa 07/19/2017  . Fall at home, initial encounter 07/19/2017  . S/P left THA, AA 05/12/2016  . LPRD (laryngopharyngeal reflux disease) 05/21/2015  . Other allergic rhinitis 05/21/2015  . Obesity 12/12/2014  . Upper airway cough syndrome 12/11/2014  . Cough variant asthma 07/27/2014  . Wheezing 06/20/2014  . Symptomatic bradycardia - s/p Biotronik (serial number [id number]) 02/08/2014  . Diabetes mellitus type 2, noninsulin dependent ([place])   . Edema of foot 03/06/2013  . Traumatic ecchymosis of right foot 03/06/2013  . Bone spur 12/30/2012  . Pain of toe of left foot 12/30/2012  . Hyperlipidemia 07/13/2012  . HTN (hypertension) 04/12/2012  . Sick sinus syndrome ([place]) 08/17/2011  . PAF (paroxysmal atrial fibrillation) ([place]) 09/16/2010    Rexanne Mano, PT 07/14/2018, 10:46 AM  [facility] [address] [place], [name], [address] Phone: [phone]   Fax:  [phone]  Name: Oscar Baker MRN: [id number] Date of Birth: [birth year]

## 2018-07-15 DIAGNOSIS — R310 Gross hematuria: Secondary | ICD-10-CM | POA: Diagnosis not present

## 2018-07-15 DIAGNOSIS — N3945 Continuous leakage: Secondary | ICD-10-CM | POA: Diagnosis not present

## 2018-07-15 DIAGNOSIS — C61 Malignant neoplasm of prostate: Secondary | ICD-10-CM | POA: Diagnosis not present

## 2018-07-15 DIAGNOSIS — R9721 Rising PSA following treatment for malignant neoplasm of prostate: Secondary | ICD-10-CM | POA: Diagnosis not present

## 2018-07-19 ENCOUNTER — Encounter: Payer: Self-pay | Admitting: Physical Therapy

## 2018-07-19 ENCOUNTER — Ambulatory Visit: Payer: Medicare Other | Admitting: Physical Therapy

## 2018-07-19 DIAGNOSIS — R2681 Unsteadiness on feet: Secondary | ICD-10-CM

## 2018-07-19 DIAGNOSIS — H532 Diplopia: Secondary | ICD-10-CM | POA: Diagnosis not present

## 2018-07-19 DIAGNOSIS — M6281 Muscle weakness (generalized): Secondary | ICD-10-CM

## 2018-07-19 DIAGNOSIS — R296 Repeated falls: Secondary | ICD-10-CM | POA: Diagnosis not present

## 2018-07-19 DIAGNOSIS — Z961 Presence of intraocular lens: Secondary | ICD-10-CM | POA: Diagnosis not present

## 2018-07-19 DIAGNOSIS — R2689 Other abnormalities of gait and mobility: Secondary | ICD-10-CM | POA: Diagnosis not present

## 2018-07-19 DIAGNOSIS — H47322 Drusen of optic disc, left eye: Secondary | ICD-10-CM | POA: Diagnosis not present

## 2018-07-19 DIAGNOSIS — R293 Abnormal posture: Secondary | ICD-10-CM | POA: Diagnosis not present

## 2018-07-19 DIAGNOSIS — H5340 Unspecified visual field defects: Secondary | ICD-10-CM | POA: Diagnosis not present

## 2018-07-19 NOTE — Therapy (Signed)
[facility] [address] [place], [name], [address] Phone: [phone]   Fax:  [phone]  Physical Therapy Treatment  Patient Details  Name: Oscar Baker MRN: [id number] Date of Birth: June 25, 1932 Referring Provider (PT): Marzetta Board, MD (hospitalist) [name], MD (PCP)   Encounter Date: 07/19/2018  PT End of Session - 07/19/18 1024    Visit Number  13    Number of Visits  17    Date for PT Re-Evaluation  08/01/18    Authorization Type  Medicare & BCBS supplement    Authorization Time Period  06/02/18 to 08/31/2018    PT Start Time  1022    PT Stop Time  1102    PT Time Calculation (min)  40 min    Equipment Utilized During Treatment  Gait belt    Activity Tolerance  Patient tolerated treatment well    Behavior During Therapy  [facility] for tasks assessed/performed       Past Medical History:  Diagnosis Date  . Asthma 1950's   history of  . Atrial fibrillation ([place])   . Bilateral carpal tunnel syndrome   . Bilateral lower extremity edema   . Bladder tumor   . Chronic systolic heart failure (HCC)    Echo 1/19: Mild LVH, EF 45-50, inf HK, MAC, severe LAE, severe RAE // Echo 7/15: Mild LVH, mod focal basal sept hypertrophy, EF 55-60, AV peak and mean 16/9, trivial MR, mod LAE, PASP 38  . CLL (chronic lymphocytic leukemia) [facility]) oncologist-  dr Ilene Qua--   dx [phone] ;  Lymphocytosis, CLL - per lov note 05-11-2017 currently under active survillance,  CT 04-17-2014 show very small lymphadenopathy, no indication for treatment  . Coronary artery disease    cardiologist-  dr Cathie Olden--  08-18-2017 Intermittant risk nuclear study w/ large area of inferior infartion with no evidence ishcemia   . 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 Diabetes mellitus type 2, noninsulin dependent ([place])   . Elevated PSA    since prostatectomy but now resolved  . Hematuria 04/2017  . History of ear infection    Right  . History of MI (myocardial  infarction)    per myoview nuclear study 08-18-2017 , unknown when  . History of shingles 08/2017   L ear and scalp, possible  . Hyperlipidemia   . Hypertension   . Ischemic cardiomyopathy 09/01/2017   Presumed +CAD with Nuclear stress test 08/18/17 - Inferior scar, no ischemia, intermediate risk // med management unless +angina or worse dyspnea  . OA (osteoarthritis)   . Pacemaker 02/08/2014   followed by dr g. taylor--  single chamber Biotronik due to SSS  . Permanent atrial fibrillation   . Pneumonia 2019   Left lung  . Prostate cancer [facility]) urologist-  dr Diona Fanti   dx 2004--  Gleason 8, PSA 10.45--  11-28-2002  s/p  radical prostatectomy;  recurrent w/ increasing PSA, started ADT treatment  . RBBB (right bundle branch block)   . Sick sinus syndrome ([place])    a-Flutter with episodes of bradycardia; S/P Biotronik (serial number [id number]) 02-08-2014  . Urinary incontinence   . Wears hearing aid in right ear    receiver and transmitter    Past Surgical History:  Procedure Laterality Date  . APPENDECTOMY    . BACK SURGERY     disk  . CARDIAC CATHETERIZATION  09-03-1999  dr Cathie Olden   abnormal cardiolite study:  minor luminal irregularities but no critial coronary artery stenosis  .  CARDIOVASCULAR STRESS TEST  08-18-2017  dr Cathie Olden   Intermediate risk nuclear study w/ large area inferior infarction, no evidence of ishcemia (consistant w/ prior MI)/  study not gated due to frequent PVCs  . CARPAL TUNNEL RELEASE Right 2000  . CARPAL TUNNEL RELEASE Left 11/19/2009  . CATARACT EXTRACTION Right 07/2015  . CATARACT EXTRACTION Left 09/2015  . CYSTOSCOPY N/A 11/04/2017   Procedure: CYSTOSCOPY AND CAUTERIZATION OF BLADDER;  Surgeon: Franchot Gallo, MD;  Location: [facility];  Service: Urology;  Laterality: N/A;  . INSERTION PENILE PROSTHESIS  02-22-2004    dr Mattie Marlin  [facility]  . IR THORACENTESIS ASP PLEURAL SPACE W/IMG GUIDE  11/26/2017  . KNEE ARTHROSCOPY Left 07/2010  .  PERMANENT PACEMAKER INSERTION N/A 02/08/2014   Procedure: PERMANENT PACEMAKER INSERTION;  Surgeon: Evans Lance, MD;  Location: [facility] CATH LAB;  Service: Cardiovascular;  Laterality: N/A;  . PLEURAL EFFUSION DRAINAGE Left 11/30/2017   Procedure: DRAINAGE OF HEMOTHORAX;  Surgeon: Ivin Poot, MD;  Location: [facility];  Service: Thoracic;  Laterality: Left;  . RADICAL RETROPUBIC PROSTATECTOMY W/ BILATERAL PELVIC LYMPH NODE DISSECTION  11-28-2002   dr Mattie Marlin  [facility]  . TONSILLECTOMY    . TOTAL HIP ARTHROPLASTY Left 05/12/2016   Procedure: LEFT TOTAL HIP ARTHROPLASTY ANTERIOR APPROACH;  Surgeon: Paralee Cancel, MD;  Location: [facility];  Service: Orthopedics;  Laterality: Left;  . TOTAL HIP ARTHROPLASTY Right 07-15-2006   dr Alvan Dame  [facility]  . TRANSTHORACIC ECHOCARDIOGRAM  07/20/2017   mild LVH, ef 45-50%, hypokinesis of the basal-midinferior myocardium, due to AFib unable to evaluate diastolic function/  severe LAE and RAE/  trivial PR and TR  . VIDEO ASSISTED THORACOSCOPY Left 11/30/2017   Procedure: VIDEO ASSISTED THORACOSCOPY;  Surgeon: Ivin Poot, MD;  Location: [facility];  Service: Thoracic;  Laterality: Left;  [name] VIDEO ASSISTED THORACOSCOPY (VATS)/EMPYEMA Left 11/29/2017   Procedure: VIDEO ASSISTED THORACOSCOPY (VATS)/EMPYEMA;  Surgeon: Ivin Poot, MD;  Location: [facility];  Service: Thoracic;  Laterality: Left;    There were no vitals filed for this visit.  Subjective Assessment - 07/19/18 1024    Subjective  No falls. Did not practice with cane at home as he did not feel steady. Still swaying. Doesn't feel the sway when standing still. Feels it when he takes the first step. Feels like he is going to drop off.     Pertinent History  Left THA 05/12/16, right THA 2008, CHF, MI, DM2, HTN, OA, prostate CA, shingles, A-fib, hearing loss, knee sg, recent thorascopy.  05/02/2017- bleeding and began with urinary incontinence    Patient Stated Goals  work on balance, "be able to walk, get off the walker (I'll  live with a cane)".  Patient has used the walker since January when he fell.    Currently in Pain?  No/denies                       [facility] Adult PT Treatment/Exercise - 07/19/18 1803      Ambulation/Gait   Ambulation/Gait Assistance  4: Min assist    Ambulation/Gait Assistance Details  without device pt with incr lateral sway of shoulders/upper trunk; with SPC has occasional incorrect sequencing     Ambulation Distance (Feet)  60 Feet   150, 60   Assistive device  Straight cane;None;Rollator    Gait Pattern  --   varied depending on device or no device used   Pre-Gait Activities  at counter, one  hand support: forward step and wt-shift over foot with hip/knee extension and upright torso then step back to start position; x 15 each leg; progressed to walking length of counter with one hand on counter with facilitation of same elements      Posture/Postural Control   Posture/Postural Control  Postural limitations    Postural Limitations  Rounded Shoulders;Forward head;Flexed trunk    Posture Comments  throughout session cues for improved posture to improve balance      Knee/Hip Exercises: Standing   Hip Flexion  Stengthening;Both;1 set;20 reps;Knee bent   3# weight on left ankle; pt could not lift with wt on right   Other Standing Knee Exercises  sidestepping at counter; with 3# ankle weights progressing to laterally stepping over low hurdles with max cues for correct technique and larger steps to leave room for trailing leg               PT Short Term Goals - 06/29/18 1449      PT SHORT TERM GOAL #1   Title  Patient will complete HEP with supervision (as needed for technique) Target all STGs 07/02/2018    Time  4    Period  Weeks    Status  Achieved      PT SHORT TERM GOAL #2   Title  The patient will improve gait speed from 1.46 ft/sec (rollator) to > or equal to 1.81 ft/sec to indicate lesser fall risk.    Baseline  06/29/18 2.55 ft/sec rollator    Time  4     Period  Weeks    Status  Achieved      PT SHORT TERM GOAL #3   Title  Pateint will improve 5xSTS time to <25 sec (using bil UEs as on evaluation)    Baseline  06/24/18  13.6 sec, bil UEs from chair    Time  4    Period  Weeks    Status  Achieved      PT SHORT TERM GOAL #4   Title  Patient will complete 6 MWT with vital signs stable and distance >=500 ft    Baseline  572 ft in 6 minutes with RW mod indep.    Period  Weeks    Status  Achieved        PT Long Term Goals - 06/03/18 3810      PT LONG TERM GOAL #1   Title  The patient will return demo progression of HEP and verbalize plan for ongoing wellness plan.  (LTGs due date: 08/01/2018)    Target Date  08/01/18      PT LONG TERM GOAL #2   Title  Patient will improve gait velocity to >= 2.4 ft/sec with rollator, indicating lesser fall risk.     Time  8    Period  Weeks    Status  New      PT LONG TERM GOAL #3   Title  The patient will perform 5 time sit<>stand without UE support in < or equal to 20 seconds.    Time  8    Period  Weeks    Status  New      PT LONG TERM GOAL #4   Title  Patient will improve 6 MWT distance (measured at STGs) by 190 ft    Time  8    Period  Weeks    Status  New            Plan - 07/19/18  1823    Clinical Impression Statement  Briefly discussed his double vision at beginning of the session. Today his diplopia is only in his far RIGHT field of vision (last visit was on his left). Patient to see his doctor soon for additional workup. Remainder of session focused on ther-ex for strengthening LEs, balance training, and gait training. Patient beginning to resign himself to the fact that he is most safe with rollator, but in the next sentence will express strong desire to keep training with cane. Patient has 3 visits remaining prior to discharge. Will discuss next visit.     PT Frequency  2x / week    PT Duration  8 weeks    PT Treatment/Interventions  ADLs/Self Care Home Management;DME  Instruction;Gait training;Stair training;Functional mobility training;Therapeutic exercise;Therapeutic activities;Balance training;Neuromuscular re-education;Patient/family education;Vestibular;Canalith Repostioning    PT Next Visit Plan  Upright posture, gait with SPC (including head turns, sudden stops, turns, cognitive challenge, curb, ramp), dynamic balance activities dec'ing UE support, endurance, stepping strategies    PT Home Exercise Plan  [phone]     Consulted and Agree with Plan of Care  Patient       Patient will benefit from skilled therapeutic intervention in order to improve the following deficits and impairments:  Abnormal gait, Cardiopulmonary status limiting activity, Decreased activity tolerance, Decreased balance, Decreased endurance, Decreased knowledge of use of DME, Decreased mobility, Difficulty walking, Decreased strength, Impaired flexibility, Impaired perceived functional ability, Obesity, Postural dysfunction, Pain  Visit Diagnosis: Other abnormalities of gait and mobility  Unsteadiness on feet  Muscle weakness (generalized)  Abnormal posture     Problem List Patient Active Problem List   Diagnosis Date Noted  . CAD (coronary artery disease) 05/21/2018  . Asthma exacerbation 05/21/2018  . Aspiration pneumonia ([place]) 05/21/2018  . Elevated INR   . Shoulder strain, right, initial encounter   . Acute respiratory failure with hypoxia ([place]) 05/20/2018  . Encounter for therapeutic drug monitoring 12/20/2017  . S/P thoracotomy 11/29/2017  . Sepsis ([place])   . Hypoxemia   . Pneumonia of left lower lobe due to infectious organism ([place])   . Mediastinal lymphadenopathy   . Chronic cough 11/23/2017  . CLL (chronic lymphocytic leukemia) ([place]) 11/23/2017  . Pleural effusion 11/23/2017  . HCAP (healthcare-associated pneumonia) 11/20/2017  . Anemia 11/20/2017  . Ischemic cardiomyopathy 09/01/2017  . Right hip pain   . Acute otitis media   . Chronic systolic CHF  (congestive heart failure) ([place])   . Pressure injury of skin 07/20/2017  . Malignant otitis externa 07/19/2017  . Fall at home, initial encounter 07/19/2017  . S/P left THA, AA 05/12/2016  . LPRD (laryngopharyngeal reflux disease) 05/21/2015  . Other allergic rhinitis 05/21/2015  . Obesity 12/12/2014  . Upper airway cough syndrome 12/11/2014  . Cough variant asthma 07/27/2014  . Wheezing 06/20/2014  . Symptomatic bradycardia - s/p Biotronik (serial number [id number]) 02/08/2014  . Diabetes mellitus type 2, noninsulin dependent ([place])   . Edema of foot 03/06/2013  . Traumatic ecchymosis of right foot 03/06/2013  . Bone spur 12/30/2012  . Pain of toe of left foot 12/30/2012  . Hyperlipidemia 07/13/2012  . HTN (hypertension) 04/12/2012  . Sick sinus syndrome ([place]) 08/17/2011  . PAF (paroxysmal atrial fibrillation) ([place]) 09/16/2010    Rexanne Mano, PT 07/19/2018, 6:30 PM  [facility] [address] [place], [name], [address] Phone: [phone]   Fax:  [phone]  Name: Oscar Baker MRN: [id number] Date of Birth:  10/20/1932   

## 2018-07-20 ENCOUNTER — Telehealth: Payer: Self-pay

## 2018-07-20 NOTE — Telephone Encounter (Signed)
LVM for pt to call device clinic back to make apt with [facility] on 08/02/2018 at 1215 to have device checked per GT recommendations to RV decrease sensitivity. Will need to set up industry rep for this apt.

## 2018-07-21 ENCOUNTER — Encounter: Payer: Self-pay | Admitting: Physical Therapy

## 2018-07-21 ENCOUNTER — Ambulatory Visit: Payer: Medicare Other | Admitting: Physical Therapy

## 2018-07-21 DIAGNOSIS — R2689 Other abnormalities of gait and mobility: Secondary | ICD-10-CM

## 2018-07-21 DIAGNOSIS — K219 Gastro-esophageal reflux disease without esophagitis: Secondary | ICD-10-CM | POA: Diagnosis not present

## 2018-07-21 DIAGNOSIS — R2681 Unsteadiness on feet: Secondary | ICD-10-CM | POA: Diagnosis not present

## 2018-07-21 DIAGNOSIS — M6281 Muscle weakness (generalized): Secondary | ICD-10-CM

## 2018-07-21 DIAGNOSIS — I130 Hypertensive heart and chronic kidney disease with heart failure and stage 1 through stage 4 chronic kidney disease, or unspecified chronic kidney disease: Secondary | ICD-10-CM | POA: Diagnosis not present

## 2018-07-21 DIAGNOSIS — E1122 Type 2 diabetes mellitus with diabetic chronic kidney disease: Secondary | ICD-10-CM | POA: Diagnosis not present

## 2018-07-21 DIAGNOSIS — Z6829 Body mass index (BMI) 29.0-29.9, adult: Secondary | ICD-10-CM | POA: Diagnosis not present

## 2018-07-21 DIAGNOSIS — C9100 Acute lymphoblastic leukemia not having achieved remission: Secondary | ICD-10-CM | POA: Diagnosis not present

## 2018-07-21 DIAGNOSIS — I5022 Chronic systolic (congestive) heart failure: Secondary | ICD-10-CM | POA: Diagnosis not present

## 2018-07-21 DIAGNOSIS — R296 Repeated falls: Secondary | ICD-10-CM | POA: Diagnosis not present

## 2018-07-21 DIAGNOSIS — H9190 Unspecified hearing loss, unspecified ear: Secondary | ICD-10-CM | POA: Diagnosis not present

## 2018-07-21 DIAGNOSIS — R293 Abnormal posture: Secondary | ICD-10-CM | POA: Diagnosis not present

## 2018-07-21 DIAGNOSIS — N183 Chronic kidney disease, stage 3 (moderate): Secondary | ICD-10-CM | POA: Diagnosis not present

## 2018-07-21 DIAGNOSIS — Z7901 Long term (current) use of anticoagulants: Secondary | ICD-10-CM | POA: Diagnosis not present

## 2018-07-21 DIAGNOSIS — N489 Disorder of penis, unspecified: Secondary | ICD-10-CM | POA: Diagnosis not present

## 2018-07-21 DIAGNOSIS — I4892 Unspecified atrial flutter: Secondary | ICD-10-CM | POA: Diagnosis not present

## 2018-07-21 DIAGNOSIS — I2721 Secondary pulmonary arterial hypertension: Secondary | ICD-10-CM | POA: Diagnosis not present

## 2018-07-21 NOTE — Therapy (Signed)
[facility] [address] [place], [name], [address] Phone: [phone]   Fax:  [phone]  Physical Therapy Treatment  Patient Details  Name: Oscar Baker MRN: [id number] Date of Birth: 24-Aug-1932 Referring Provider (PT): Marzetta Board, MD (hospitalist) [name], MD (PCP)   Encounter Date: 07/21/2018  PT End of Session - 07/21/18 1016    Visit Number  14    Number of Visits  17    Date for PT Re-Evaluation  08/01/18    Authorization Type  Medicare & BCBS supplement    Authorization Time Period  06/02/18 to 08/31/2018    PT Start Time  0933    PT Stop Time  1015    PT Time Calculation (min)  42 min    Equipment Utilized During Treatment  Gait belt    Activity Tolerance  Patient tolerated treatment well    Behavior During Therapy  [facility] for tasks assessed/performed       Past Medical History:  Diagnosis Date  . Asthma 1950's   history of  . Atrial fibrillation ([place])   . Bilateral carpal tunnel syndrome   . Bilateral lower extremity edema   . Bladder tumor   . Chronic systolic heart failure (HCC)    Echo 1/19: Mild LVH, EF 45-50, inf HK, MAC, severe LAE, severe RAE // Echo 7/15: Mild LVH, mod focal basal sept hypertrophy, EF 55-60, AV peak and mean 16/9, trivial MR, mod LAE, PASP 38  . CLL (chronic lymphocytic leukemia) [facility]) oncologist-  dr Ilene Qua--   dx [phone] ;  Lymphocytosis, CLL - per lov note 05-11-2017 currently under active survillance,  CT 04-17-2014 show very small lymphadenopathy, no indication for treatment  . Coronary artery disease    cardiologist-  dr Cathie Olden--  08-18-2017 Intermittant risk nuclear study w/ large area of inferior infartion with no evidence ishcemia   . 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 Diabetes mellitus type 2, noninsulin dependent ([place])   . Elevated PSA    since prostatectomy but now resolved  . Hematuria 04/2017  . History of ear infection    Right  . History of MI (myocardial  infarction)    per myoview nuclear study 08-18-2017 , unknown when  . History of shingles 08/2017   L ear and scalp, possible  . Hyperlipidemia   . Hypertension   . Ischemic cardiomyopathy 09/01/2017   Presumed +CAD with Nuclear stress test 08/18/17 - Inferior scar, no ischemia, intermediate risk // med management unless +angina or worse dyspnea  . OA (osteoarthritis)   . Pacemaker 02/08/2014   followed by dr g. taylor--  single chamber Biotronik due to SSS  . Permanent atrial fibrillation   . Pneumonia 2019   Left lung  . Prostate cancer [facility]) urologist-  dr Diona Fanti   dx 2004--  Gleason 8, PSA 10.45--  11-28-2002  s/p  radical prostatectomy;  recurrent w/ increasing PSA, started ADT treatment  . RBBB (right bundle branch block)   . Sick sinus syndrome ([place])    a-Flutter with episodes of bradycardia; S/P Biotronik (serial number [id number]) 02-08-2014  . Urinary incontinence   . Wears hearing aid in right ear    receiver and transmitter    Past Surgical History:  Procedure Laterality Date  . APPENDECTOMY    . BACK SURGERY     disk  . CARDIAC CATHETERIZATION  09-03-1999  dr Cathie Olden   abnormal cardiolite study:  minor luminal irregularities but no critial coronary artery stenosis  .  CARDIOVASCULAR STRESS TEST  08-18-2017  dr Cathie Olden   Intermediate risk nuclear study w/ large area inferior infarction, no evidence of ishcemia (consistant w/ prior MI)/  study not gated due to frequent PVCs  . CARPAL TUNNEL RELEASE Right 2000  . CARPAL TUNNEL RELEASE Left 11/19/2009  . CATARACT EXTRACTION Right 07/2015  . CATARACT EXTRACTION Left 09/2015  . CYSTOSCOPY N/A 11/04/2017   Procedure: CYSTOSCOPY AND CAUTERIZATION OF BLADDER;  Surgeon: Franchot Gallo, MD;  Location: [facility];  Service: Urology;  Laterality: N/A;  . INSERTION PENILE PROSTHESIS  02-22-2004    dr Mattie Marlin  [facility]  . IR THORACENTESIS ASP PLEURAL SPACE W/IMG GUIDE  11/26/2017  . KNEE ARTHROSCOPY Left 07/2010  .  PERMANENT PACEMAKER INSERTION N/A 02/08/2014   Procedure: PERMANENT PACEMAKER INSERTION;  Surgeon: Evans Lance, MD;  Location: [facility] CATH LAB;  Service: Cardiovascular;  Laterality: N/A;  . PLEURAL EFFUSION DRAINAGE Left 11/30/2017   Procedure: DRAINAGE OF HEMOTHORAX;  Surgeon: Ivin Poot, MD;  Location: [facility];  Service: Thoracic;  Laterality: Left;  . RADICAL RETROPUBIC PROSTATECTOMY W/ BILATERAL PELVIC LYMPH NODE DISSECTION  11-28-2002   dr Mattie Marlin  [facility]  . TONSILLECTOMY    . TOTAL HIP ARTHROPLASTY Left 05/12/2016   Procedure: LEFT TOTAL HIP ARTHROPLASTY ANTERIOR APPROACH;  Surgeon: Paralee Cancel, MD;  Location: [facility];  Service: Orthopedics;  Laterality: Left;  . TOTAL HIP ARTHROPLASTY Right 07-15-2006   dr Alvan Dame  [facility]  . TRANSTHORACIC ECHOCARDIOGRAM  07/20/2017   mild LVH, ef 45-50%, hypokinesis of the basal-midinferior myocardium, due to AFib unable to evaluate diastolic function/  severe LAE and RAE/  trivial PR and TR  . VIDEO ASSISTED THORACOSCOPY Left 11/30/2017   Procedure: VIDEO ASSISTED THORACOSCOPY;  Surgeon: Ivin Poot, MD;  Location: [facility];  Service: Thoracic;  Laterality: Left;  [name] VIDEO ASSISTED THORACOSCOPY (VATS)/EMPYEMA Left 11/29/2017   Procedure: VIDEO ASSISTED THORACOSCOPY (VATS)/EMPYEMA;  Surgeon: Ivin Poot, MD;  Location: [facility];  Service: Thoracic;  Laterality: Left;    There were no vitals filed for this visit.  Subjective Assessment - 07/21/18 0938    Subjective  No falls. Saw the doctor re: his eyes and told his optic nerves are changing due to aging. Decr peripheral vision. I did my exercises and it's always harder when my left foot is forward.     Patient is accompained by:  Family member    Pertinent History  Left THA 05/12/16, right THA 2008, CHF, MI, DM2, HTN, OA, prostate CA, shingles, A-fib, hearing loss, knee sg, recent thorascopy.  05/02/2017- bleeding and began with urinary incontinence    Patient Stated Goals  work on balance, "be able to  walk, get off the walker (I'll live with a cane)".  Patient has used the walker since January when he fell.    Currently in Pain?  No/denies                       [facility] Adult PT Treatment/Exercise - 07/21/18 1004      Ambulation/Gait   Ambulation/Gait Assistance  6: Modified independent (Device/Increase time);4: Min assist    Ambulation/Gait Assistance Details  with rollator vs no device; vc for less leaning on UEs on handles    Ambulation Distance (Feet)  80 Feet   40, 40, 240   Assistive device  Rollator;None;Parallel bars    Gait Pattern  Step-through pattern;Decreased stride length;Lateral hip instability;Trunk flexed;Wide base of support;Decreased weight shift to  left;Right flexed knee in stance;Left flexed knee in stance    Ambulation Surface  Level;Indoor    Gait velocity  --      Ankle Exercises: Standing   Heel Raises  Both;10 reps    Toe Raise  5 reps    Heel Walk (Round Trip)  1 in // bars    Toe Walk (Round Trip)  1 in // bars          Balance Exercises - 07/21/18 2038      Balance Exercises: Standing   Standing Eyes Opened  Narrow base of support (BOS);Head turns;Solid surface   partial tandem   Standing Eyes Closed  Narrow base of support (BOS);Head turns;Solid surface   feet together   Wall Bumps  Hip    Wall Bumps-Hips  Eyes opened;Anterior/posterior;10 reps    Stepping Strategy  Anterior;Posterior;UE support;5 reps   each leg, each direction; pt would not attempt without UE   Balance Beam  blue beam crosswise; EO head turns, nods: step off/on anterior and posterior    Tandem Gait  Forward;Upper extremity support    Retro Gait  4 reps        PT Education - 07/21/18 2045    Education Details  will need to continue to use rollator on discharge from PT (with d/c planned for 07/28/18    Person(s) Educated  Patient;Spouse    Methods  Explanation    Comprehension  Verbalized understanding       PT Short Term Goals - 06/29/18 1449      PT  SHORT TERM GOAL #1   Title  Patient will complete HEP with supervision (as needed for technique) Target all STGs 07/02/2018    Time  4    Period  Weeks    Status  Achieved      PT SHORT TERM GOAL #2   Title  The patient will improve gait speed from 1.46 ft/sec (rollator) to > or equal to 1.81 ft/sec to indicate lesser fall risk.    Baseline  06/29/18 2.55 ft/sec rollator    Time  4    Period  Weeks    Status  Achieved      PT SHORT TERM GOAL #3   Title  Pateint will improve 5xSTS time to <25 sec (using bil UEs as on evaluation)    Baseline  06/24/18  13.6 sec, bil UEs from chair    Time  4    Period  Weeks    Status  Achieved      PT SHORT TERM GOAL #4   Title  Patient will complete 6 MWT with vital signs stable and distance >=500 ft    Baseline  572 ft in 6 minutes with RW mod indep.    Period  Weeks    Status  Achieved        PT Long Term Goals - 06/03/18 4128      PT LONG TERM GOAL #1   Title  The patient will return demo progression of HEP and verbalize plan for ongoing wellness plan.  (LTGs due date: 08/01/2018)    Target Date  08/01/18      PT LONG TERM GOAL #2   Title  Patient will improve gait velocity to >= 2.4 ft/sec with rollator, indicating lesser fall risk.     Time  8    Period  Weeks    Status  New      PT LONG TERM GOAL #3  Title  The patient will perform 5 time sit<>stand without UE support in < or equal to 20 seconds.    Time  8    Period  Weeks    Status  New      PT LONG TERM GOAL #4   Title  Patient will improve 6 MWT distance (measured at STGs) by 190 ft    Time  [place] - 07/21/18 2047    Clinical Impression Statement  Session focused on balance training (static and dynamic standing balance; solid surface vs compliant; ankle vs hip vs stepping strategies) and gait training with rollator (emphasis on upright posture and less wt through his hands). Wife present today and reinforces pt's need to  continue to use his rollator due to imbalance. Patient agrees with plan for discharge next week.     PT Frequency  2x / week    PT Duration  8 weeks    PT Treatment/Interventions  ADLs/Self Care Home Management;DME Instruction;Gait training;Stair training;Functional mobility training;Therapeutic exercise;Therapeutic activities;Balance training;Neuromuscular re-education;Patient/family education;Vestibular;Canalith Repostioning    PT Next Visit Plan  ?begin to chk LTGs (or can be done 2/6 last visit); Upright posture, gait with rollator (including head turns, sudden stops, turns, cognitive challenge, curb, ramp), dynamic balance activities dec'ing UE support, endurance, stepping strategies    PT Home Exercise Plan  [id number]     Consulted and Agree with Plan of Care  Patient;Family member/caregiver    Family Member Consulted  spouse, Opal Sidles       Patient will benefit from skilled therapeutic intervention in order to improve the following deficits and impairments:  Abnormal gait, Cardiopulmonary status limiting activity, Decreased activity tolerance, Decreased balance, Decreased endurance, Decreased knowledge of use of DME, Decreased mobility, Difficulty walking, Decreased strength, Impaired flexibility, Impaired perceived functional ability, Obesity, Postural dysfunction, Pain  Visit Diagnosis: Other abnormalities of gait and mobility  Unsteadiness on feet  Muscle weakness (generalized)  Abnormal posture     Problem List Patient Active Problem List   Diagnosis Date Noted  . CAD (coronary artery disease) 05/21/2018  . Asthma exacerbation 05/21/2018  . Aspiration pneumonia ([place]) 05/21/2018  . Elevated INR   . Shoulder strain, right, initial encounter   . Acute respiratory failure with hypoxia ([place]) 05/20/2018  . Encounter for therapeutic drug monitoring 12/20/2017  . S/P thoracotomy 11/29/2017  . Sepsis ([place])   . Hypoxemia   . Pneumonia of left lower lobe due to infectious organism  ([place])   . Mediastinal lymphadenopathy   . Chronic cough 11/23/2017  . CLL (chronic lymphocytic leukemia) ([place]) 11/23/2017  . Pleural effusion 11/23/2017  . HCAP (healthcare-associated pneumonia) 11/20/2017  . Anemia 11/20/2017  . Ischemic cardiomyopathy 09/01/2017  . Right hip pain   . Acute otitis media   . Chronic systolic CHF (congestive heart failure) ([place])   . Pressure injury of skin 07/20/2017  . Malignant otitis externa 07/19/2017  . Fall at home, initial encounter 07/19/2017  . S/P left THA, AA 05/12/2016  . LPRD (laryngopharyngeal reflux disease) 05/21/2015  . Other allergic rhinitis 05/21/2015  . Obesity 12/12/2014  . Upper airway cough syndrome 12/11/2014  . Cough variant asthma 07/27/2014  . Wheezing 06/20/2014  . Symptomatic bradycardia - s/p Biotronik (serial number [id number]) 02/08/2014  . Diabetes mellitus type 2, noninsulin dependent ([place])   . Edema of foot 03/06/2013  . Traumatic ecchymosis of  right foot 03/06/2013  . Bone spur 12/30/2012  . Pain of toe of left foot 12/30/2012  . Hyperlipidemia 07/13/2012  . HTN (hypertension) 04/12/2012  . Sick sinus syndrome ([place]) 08/17/2011  . PAF (paroxysmal atrial fibrillation) ([place]) 09/16/2010    Rexanne Mano, PT 07/21/2018, 8:51 PM  [facility] [address] [place], [name], [address] Phone: [phone]   Fax:  [phone]  Name: Oscar Baker MRN: [id number] Date of Birth: 01/19/1933

## 2018-07-25 ENCOUNTER — Ambulatory Visit: Payer: Medicare Other | Attending: Internal Medicine | Admitting: Rehabilitative and Restorative Service Providers"

## 2018-07-25 ENCOUNTER — Other Ambulatory Visit: Payer: Self-pay | Admitting: Internal Medicine

## 2018-07-25 ENCOUNTER — Encounter: Payer: Self-pay | Admitting: Rehabilitative and Restorative Service Providers"

## 2018-07-25 DIAGNOSIS — R296 Repeated falls: Secondary | ICD-10-CM | POA: Diagnosis not present

## 2018-07-25 DIAGNOSIS — R2689 Other abnormalities of gait and mobility: Secondary | ICD-10-CM | POA: Insufficient documentation

## 2018-07-25 DIAGNOSIS — M6281 Muscle weakness (generalized): Secondary | ICD-10-CM | POA: Diagnosis not present

## 2018-07-25 DIAGNOSIS — R2681 Unsteadiness on feet: Secondary | ICD-10-CM | POA: Diagnosis not present

## 2018-07-25 DIAGNOSIS — R293 Abnormal posture: Secondary | ICD-10-CM | POA: Diagnosis not present

## 2018-07-25 NOTE — Patient Instructions (Addendum)
Feet Together, Varied Arm Positions - Eyes Closed    Stand with feet together and arms AT YOUR SIDE. Close eyes and visualize upright position. Hold __30__ seconds. Repeat __3__ times per session. Do _1-2___ sessions per day.  Copyright  VHI. All rights reserved.    Feet Partial Heel-Toe, Head Motion - Eyes Open    Stand with your back to the corner and a chair backwards in front of you. With eyes open, right foot partially in front of the other, move head slowly: up and down x 10 and then left to right x 10. Switch and put the other foot forward and do the same. Do __1__ sessions per day.  SINGLE LIMB STANCE    Stance: single leg on floor. HOLD ONTO A COUNTERTOP WITH ONE HAND (OR A CHAIR BACK). Raise leg. Hold _10-30__ seconds. Repeat with other leg. _3__ reps per set, _1__ sets per day, __7  Days per week.   Functional Quadriceps: Sit to Stand    Sit on edge of chair, feet flat on floor. You can use your hands to push up (work towards only using 1 arm rest if able).  Stand upright, extending knees fully. Repeat __20__ times per set. Do _1___ sets per session. Do ___1_ sessions per day.  http://orth.exer.us/734   Copyright  VHI. All rights reserved.

## 2018-07-25 NOTE — Therapy (Signed)
[facility] [address] [place], [name], [address] Phone: [phone]   Fax:  [phone]  Physical Therapy Treatment  Patient Details  Name: Oscar Baker MRN: [id number] Date of Birth: 20-Mar-1933 Referring Provider (PT): Marzetta Board, MD (hospitalist) [name], MD (PCP)   Encounter Date: 07/25/2018  PT End of Session - 07/25/18 1025    Visit Number  15    Number of Visits  17    Date for PT Re-Evaluation  08/01/18    Authorization Type  Medicare & BCBS supplement    Authorization Time Period  06/02/18 to 08/31/2018    PT Start Time  1020    PT Stop Time  1100    PT Time Calculation (min)  40 min    Equipment Utilized During Treatment  Gait belt    Activity Tolerance  Patient tolerated treatment well    Behavior During Therapy  [facility] for tasks assessed/performed       Past Medical History:  Diagnosis Date  . Asthma 1950's   history of  . Atrial fibrillation ([place])   . Bilateral carpal tunnel syndrome   . Bilateral lower extremity edema   . Bladder tumor   . Chronic systolic heart failure (HCC)    Echo 1/19: Mild LVH, EF 45-50, inf HK, MAC, severe LAE, severe RAE // Echo 7/15: Mild LVH, mod focal basal sept hypertrophy, EF 55-60, AV peak and mean 16/9, trivial MR, mod LAE, PASP 38  . CLL (chronic lymphocytic leukemia) [facility]) oncologist-  dr Ilene Qua--   dx [phone] ;  Lymphocytosis, CLL - per lov note 05-11-2017 currently under active survillance,  CT 04-17-2014 show very small lymphadenopathy, no indication for treatment  . Coronary artery disease    cardiologist-  dr Cathie Olden--  08-18-2017 Intermittant risk nuclear study w/ large area of inferior infartion with no evidence ishcemia   . 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 Diabetes mellitus type 2, noninsulin dependent ([place])   . Elevated PSA    since prostatectomy but now resolved  . Hematuria 04/2017  . History of ear infection    Right  . History of MI (myocardial  infarction)    per myoview nuclear study 08-18-2017 , unknown when  . History of shingles 08/2017   L ear and scalp, possible  . Hyperlipidemia   . Hypertension   . Ischemic cardiomyopathy 09/01/2017   Presumed +CAD with Nuclear stress test 08/18/17 - Inferior scar, no ischemia, intermediate risk // med management unless +angina or worse dyspnea  . OA (osteoarthritis)   . Pacemaker 02/08/2014   followed by dr g. taylor--  single chamber Biotronik due to SSS  . Permanent atrial fibrillation   . Pneumonia 2019   Left lung  . Prostate cancer [facility]) urologist-  dr Diona Fanti   dx 2004--  Gleason 8, PSA 10.45--  11-28-2002  s/p  radical prostatectomy;  recurrent w/ increasing PSA, started ADT treatment  . RBBB (right bundle branch block)   . Sick sinus syndrome ([place])    a-Flutter with episodes of bradycardia; S/P Biotronik (serial number [id number]) 02-08-2014  . Urinary incontinence   . Wears hearing aid in right ear    receiver and transmitter    Past Surgical History:  Procedure Laterality Date  . APPENDECTOMY    . BACK SURGERY     disk  . CARDIAC CATHETERIZATION  09-03-1999  dr Cathie Olden   abnormal cardiolite study:  minor luminal irregularities but no critial coronary artery stenosis  .  CARDIOVASCULAR STRESS TEST  08-18-2017  dr Cathie Olden   Intermediate risk nuclear study w/ large area inferior infarction, no evidence of ishcemia (consistant w/ prior MI)/  study not gated due to frequent PVCs  . CARPAL TUNNEL RELEASE Right 2000  . CARPAL TUNNEL RELEASE Left 11/19/2009  . CATARACT EXTRACTION Right 07/2015  . CATARACT EXTRACTION Left 09/2015  . CYSTOSCOPY N/A 11/04/2017   Procedure: CYSTOSCOPY AND CAUTERIZATION OF BLADDER;  Surgeon: Franchot Gallo, MD;  Location[facility];  Service: Urology;  Laterality: N/A;  . INSERTION PENILE PROSTHESIS  02-22-2004    dr Mattie Marlin  [facility]  . IR THORACENTESIS ASP PLEURAL SPACE W/IMG GUIDE  11/26/2017  . KNEE ARTHROSCOPY Left 07/2010  .  PERMANENT PACEMAKER INSERTION N/A 02/08/2014   Procedure: PERMANENT PACEMAKER INSERTION;  Surgeon: Evans Lance, MD;  Location: [facility] C[facility] LAB;  Service: Cardiovascular;  Laterality: N/A;  . PLEURAL EFFUSION DRAINAGE Left 11/30/2017   Procedure: DRAINAGE OF HEMOTHORAX;  Surgeon: Ivin [facility], MD;  Location: [facility];  Service: Thoracic;  Laterality: Left;  . RADICAL RETROPUBIC PROSTATECTOMY W/ BILATERAL PELVIC LYMPH NODE DISSECTION  11-28-2002   dr Mattie Marlin  [facility]  . TONSILLECTOMY    . TOTAL HIP ARTHROPLASTY Left 05/12/2016   Procedure: LEFT TOTAL HIP ARTHROPLASTY ANTERIOR APPROACH;  Surgeon: Paralee Cancel, MD;  Location: [facility];  Service: Orthopedics;  Laterality: Left;  . TOTAL HIP ARTHROPLASTY Right 07-15-2006   dr Alvan Dame  [facility]  . TRANSTHORACIC ECHOCARDIOGRAM  07/20/2017   mild LVH, ef 45-50%, hypokinesis of the basal-midinferior myocardium, due to AFib unable to evaluate diastolic function/  severe LAE and RAE/  trivial PR and TR  . VIDEO ASSISTED THORACOSCOPY Left 11/30/2017   Procedure: VIDEO ASSISTED THORACOSCOPY;  Surgeon: Ivin [facility], MD;  Location: [facility];  Service: Thoracic;  Laterality: Left;  [name] VIDEO ASSISTED THORACOSCOPY (VATS)/EMPYEMA Left 11/29/2017   Procedure: VIDEO ASSISTED THORACOSCOPY (VATS)/EMPYEMA;  Surgeon: Ivin [facility], MD;  Location: [facility];  Service: Thoracic;  Laterality: Left;    There were no vitals filed for this visit.  Subjective Assessment - 07/25/18 1023    Subjective  The patient notes no falls and we discussed that he plans to discharge on thursday from PT with the plan to continue to use his rollator RW instead of a cane.     Pertinent History  Left THA 05/12/16, right THA 2008, CHF, MI, DM2, HTN, OA, prostate CA, shingles, A-fib, hearing loss, knee sg, recent thorascopy.  05/02/2017- bleeding and began with urinary incontinence    Patient Stated Goals  work on balance, "be able to walk, get off the walker (I'll live with a cane)".  Patient has used the  walker since January when he fell.    Currently in Pain?  No/denies         [facility] PT Assessment - 07/25/18 1025      Transfers   Transfers  Sit to Stand;Stand to Sit    Sit to Stand  6: Modified independent (Device/Increase time);With upper extremity assist    Five time sit to stand comments   11.19 seconds    patient initially took on 21 seconds/said he could do faster     6 Minute Walk- Baseline   6 Minute Walk- Baseline  yes      6 minute walk test results    Aerobic Endurance Distance Walked  860    Endurance additional comments  with rollator RW (norm for 63-83 yo male is  1368 feet).  Patient improved from 572 ft up to 860 ft (214f)                   [facility] Adult PT Treatment/Exercise - 07/25/18 1025      Ambulation/Gait   Ambulation/Gait  Yes    Ambulation/Gait Assistance  6: Modified independent (Device/Increase time)    Ambulation/Gait Assistance Details  with rollator RW    Ambulation Distance (Feet)  --   see 6 minute walk test   Assistive device  Rollator    Gait Pattern  Step-through pattern;Decreased stride length;Lateral hip instability;Trunk flexed    Ambulation Surface  Level;Indoor      Neuro Re-ed    Neuro Re-ed Details   Reviewed all HEP and modified eyes closed by moving feet closer together.  Also increased repetitions on sit<>stand from 10 up to 20 and recommended patient use one UE support instead of 2.  He notes he will continue to use 2 hands.  Also reviewed single limb stance and balance with eyes closed + head motion.              PT Education - 07/25/18 1221    Education Details  *modified HEP (due to patient reporting "I can stand here all day" to do eyes closed feet apart).  See updated printout.    Person(s) Educated  Patient;Spouse    Methods  Explanation;Demonstration;Handout    Comprehension  Verbalized understanding;Returned demonstration       PT Short Term Goals - 06/29/18 1449      PT SHORT TERM GOAL #1   Title   Patient will complete HEP with supervision (as needed for technique) Target all STGs 07/02/2018    Time  4    Period  Weeks    Status  Achieved      PT SHORT TERM GOAL #2   Title  The patient will improve gait speed from 1.46 ft/sec (rollator) to > or equal to 1.81 ft/sec to indicate lesser fall risk.    Baseline  06/29/18 2.55 ft/sec rollator    Time  4    Period  Weeks    Status  Achieved      PT SHORT TERM GOAL #3   Title  Pateint will improve 5xSTS time to <25 sec (using bil UEs as on evaluation)    Baseline  06/24/18  13.6 sec, bil UEs from chair    Time  4    Period  Weeks    Status  Achieved      PT SHORT TERM GOAL #4   Title  Patient will complete 6 MWT with vital signs stable and distance >=500 ft    Baseline  572 ft in 6 minutes with RW mod indep.    Period  Weeks    Status  Achieved        PT Long Term Goals - 07/25/18 1035      PT LONG TERM GOAL #1   Title  The patient will return demo progression of HEP and verbalize plan for ongoing wellness plan.  (LTGs due date: 08/01/2018)      PT LONG TERM GOAL #2   Title  Patient will improve gait velocity to >= 2.4 ft/sec with rollator, indicating lesser fall risk.     Time  8    Period  Weeks    Status  New      PT LONG TERM GOAL #3   Title  The patient will perform 5  time sit<>stand without UE support in < or equal to 20 seconds.    Baseline  11.19 seconds on 07/25/2018  (32.1 on 06/02/18 during re-eval).    Time  8    Period  Weeks    Status  Achieved      PT LONG TERM GOAL #4   Title  Patient will improve 6 MWT distance (measured at STGs) by 190 ft    Baseline  Improved from 575 ft up to 860 ft.    Time  8    Period  Weeks    Status  Achieved            Plan - 07/25/18 1223    Clinical Impression Statement  The patient met 2 LTGs.  PT updated HEP when checking his progress due to his reports of low challenge with one activity.  The patient and PT also discussed beginning to use home gym (at his apartment)  to return to recumbent bike or seated stepper (recommended start at 5 minutes and use rollator to go there).  PT to discharge next visit.      PT Treatment/Interventions  ADLs/Self Care Home Management;DME Instruction;Gait training;Stair training;Functional mobility training;Therapeutic exercise;Therapeutic activities;Balance training;Neuromuscular re-education;Patient/family education;Vestibular;Canalith Repostioning    PT Next Visit Plan  Check revised HEP from last session (small modifications made), check remaining LTGs, d/c with plan to continue rollator RW use.    Consulted and Agree with Plan of Care  Patient       Patient will benefit from skilled therapeutic intervention in order to improve the following deficits and impairments:  Abnormal gait, Cardiopulmonary status limiting activity, Decreased activity tolerance, Decreased balance, Decreased endurance, Decreased knowledge of use of DME, Decreased mobility, Difficulty walking, Decreased strength, Impaired flexibility, Impaired perceived functional ability, Obesity, Postural dysfunction, Pain  Visit Diagnosis: Other abnormalities of gait and mobility  Muscle weakness (generalized)  Unsteadiness on feet  Abnormal posture  Repeated falls     Problem List Patient Active Problem List   Diagnosis Date Noted  . CAD (coronary artery disease) 05/21/2018  . Asthma exacerbation 05/21/2018  . Aspiration pneumonia ([place]) 05/21/2018  . Elevated INR   . Shoulder strain, right, initial encounter   . Acute respiratory failure with hypoxia ([place]) 05/20/2018  . Encounter for therapeutic drug monitoring 12/20/2017  . S/P thoracotomy 11/29/2017  . Sepsis ([place])   . Hypoxemia   . Pneumonia of left lower lobe due to infectious organism ([place])   . Mediastinal lymphadenopathy   . Chronic cough 11/23/2017  . CLL (chronic lymphocytic leukemia) ([place]) 11/23/2017  . Pleural effusion 11/23/2017  . HCAP (healthcare-associated pneumonia) 11/20/2017   . Anemia 11/20/2017  . Ischemic cardiomyopathy 09/01/2017  . Right hip pain   . Acute otitis media   . Chronic systolic CHF (congestive heart failure) ([place])   . Pressure injury of skin 07/20/2017  . Malignant otitis externa 07/19/2017  . Fall at home, initial encounter 07/19/2017  . S/P left THA, AA 05/12/2016  . LPRD (laryngopharyngeal reflux disease) 05/21/2015  . Other allergic rhinitis 05/21/2015  . Obesity 12/12/2014  . Upper airway cough syndrome 12/11/2014  . Cough variant asthma 07/27/2014  . Wheezing 06/20/2014  . Symptomatic bradycardia - s/p Biotronik (serial number [id number]) 02/08/2014  . Diabetes mellitus type 2, noninsulin dependent ([place])   . Edema of foot 03/06/2013  . Traumatic ecchymosis of right foot 03/06/2013  . Bone spur 12/30/2012  . Pain of toe of left foot 12/30/2012  . Hyperlipidemia 07/13/2012  .  HTN (hypertension) 04/12/2012  . Sick sinus syndrome ([place]) 08/17/2011  . PAF (paroxysmal atrial fibrillation) ([place]) 09/16/2010    Oscar Baker, PT 07/25/2018, 12:25 PM  [facility] [address] [place], [name], [address] Phone: [phone]   Fax:  [phone]  Name: Oscar Baker MRN: [id number] Date of Birth: 06-18-1933

## 2018-07-26 ENCOUNTER — Ambulatory Visit (INDEPENDENT_AMBULATORY_CARE_PROVIDER_SITE_OTHER): Payer: Medicare Other

## 2018-07-26 DIAGNOSIS — I480 Paroxysmal atrial fibrillation: Secondary | ICD-10-CM | POA: Diagnosis not present

## 2018-07-26 DIAGNOSIS — I4821 Permanent atrial fibrillation: Secondary | ICD-10-CM

## 2018-07-26 DIAGNOSIS — Z5181 Encounter for therapeutic drug level monitoring: Secondary | ICD-10-CM

## 2018-07-26 LAB — POCT INR: INR: 3.5 — AB (ref 2.0–3.0)

## 2018-07-26 NOTE — Patient Instructions (Signed)
Please skip coumadin tonight, then continue 1 tablet daily except 1.5 tablets on Saturdays. Recheck in 4 weeks.  Call [facility] [phone] with any concerns.

## 2018-07-28 ENCOUNTER — Encounter: Payer: Self-pay | Admitting: Physical Therapy

## 2018-07-28 ENCOUNTER — Ambulatory Visit: Payer: Medicare Other | Admitting: Physical Therapy

## 2018-07-28 DIAGNOSIS — R2681 Unsteadiness on feet: Secondary | ICD-10-CM

## 2018-07-28 DIAGNOSIS — R2689 Other abnormalities of gait and mobility: Secondary | ICD-10-CM

## 2018-07-28 DIAGNOSIS — R296 Repeated falls: Secondary | ICD-10-CM | POA: Diagnosis not present

## 2018-07-28 DIAGNOSIS — R293 Abnormal posture: Secondary | ICD-10-CM | POA: Diagnosis not present

## 2018-07-28 DIAGNOSIS — M6281 Muscle weakness (generalized): Secondary | ICD-10-CM | POA: Diagnosis not present

## 2018-07-28 NOTE — Therapy (Signed)
[facility] [address] [place], [name], [address] Phone: [phone]   Fax:  [phone]  Physical Therapy Treatment and Discharge Summary  Patient Details  Name: Oscar Baker MRN: [id number] Date of Birth: [birth year] Referring Provider (PT): Marzetta Board, MD (hospitalist) [name], MD (PCP)   Encounter Date: 07/28/2018  PT End of Session - 07/28/18 1024    Visit Number  15    Number of Visits  17    Date for PT Re-Evaluation  08/01/18    Authorization Type  Medicare & BCBS supplement    Authorization Time Period  06/02/18 to 08/31/2018    PT Start Time  1020    PT Stop Time  1100    PT Time Calculation (min)  40 min    Equipment Utilized During Treatment  Gait belt    Activity Tolerance  Patient tolerated treatment well    Behavior During Therapy  [facility] for tasks assessed/performed       Past Medical History:  Diagnosis Date  . Asthma 1950's   history of  . Atrial fibrillation ([place])   . Bilateral carpal tunnel syndrome   . Bilateral lower extremity edema   . Bladder tumor   . Chronic systolic heart failure (HCC)    Echo 1/19: Mild LVH, EF 45-50, inf HK, MAC, severe LAE, severe RAE // Echo 7/15: Mild LVH, mod focal basal sept hypertrophy, EF 55-60, AV peak and mean 16/9, trivial MR, mod LAE, PASP 38  . CLL (chronic lymphocytic leukemia) [facility]) oncologist-  dr Ilene Qua--   dx [phone] ;  Lymphocytosis, CLL - per lov note 05-11-2017 currently under active survillance,  CT 04-17-2014 show very small lymphadenopathy, no indication for treatment  . Coronary artery disease    cardiologist-  dr Cathie Olden--  08-18-2017 Intermittant risk nuclear study w/ large area of inferior infartion with no evidence ishcemia   . 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 Diabetes mellitus type 2, noninsulin dependent ([place])   . Elevated PSA    since prostatectomy but now resolved  . Hematuria 04/2017  . History of ear infection    Right  .  History of MI (myocardial infarction)    per myoview nuclear study 08-18-2017 , unknown when  . History of shingles 08/2017   L ear and scalp, possible  . Hyperlipidemia   . Hypertension   . Ischemic cardiomyopathy 09/01/2017   Presumed +CAD with Nuclear stress test 08/18/17 - Inferior scar, no ischemia, intermediate risk // med management unless +angina or worse dyspnea  . OA (osteoarthritis)   . Pacemaker 02/08/2014   followed by dr g. taylor--  single chamber Biotronik due to SSS  . Permanent atrial fibrillation   . Pneumonia 2019   Left lung  . Prostate cancer [facility]) urologist-  dr Diona Fanti   dx 2004--  Gleason 8, PSA 10.45--  11-28-2002  s/p  radical prostatectomy;  recurrent w/ increasing PSA, started ADT treatment  . RBBB (right bundle branch block)   . Sick sinus syndrome ([place])    a-Flutter with episodes of bradycardia; S/P Biotronik (serial number [id number]) 02-08-2014  . Urinary incontinence   . Wears hearing aid in right ear    receiver and transmitter    Past Surgical History:  Procedure Laterality Date  . APPENDECTOMY    . BACK SURGERY     disk  . CARDIAC CATHETERIZATION  09-03-1999  dr Cathie Olden   abnormal cardiolite study:  minor luminal irregularities but no critial  coronary artery stenosis  . CARDIOVASCULAR STRESS TEST  08-18-2017  dr Cathie Olden   Intermediate risk nuclear study w/ large area inferior infarction, no evidence of ishcemia (consistant w/ prior MI)/  study not gated due to frequent PVCs  . CARPAL TUNNEL RELEASE Right 2000  . CARPAL TUNNEL RELEASE Left 11/19/2009  . CATARACT EXTRACTION Right 07/2015  . CATARACT EXTRACTION Left 09/2015  . CYSTOSCOPY N/A 11/04/2017   Procedure: CYSTOSCOPY AND CAUTERIZATION OF BLADDER;  Surgeon: Franchot Gallo, MD;  Location: [facility];  Service: Urology;  Laterality: N/A;  . INSERTION PENILE PROSTHESIS  02-22-2004    dr Mattie Marlin  [facility]  . IR THORACENTESIS ASP PLEURAL SPACE W/IMG GUIDE  11/26/2017  . KNEE  ARTHROSCOPY Left 07/2010  . PERMANENT PACEMAKER INSERTION N/A 02/08/2014   Procedure: PERMANENT PACEMAKER INSERTION;  Surgeon: Evans Lance, MD;  Location: [facility] CATH LAB;  Service: Cardiovascular;  Laterality: N/A;  . PLEURAL EFFUSION DRAINAGE Left 11/30/2017   Procedure: DRAINAGE OF HEMOTHORAX;  Surgeon: Ivin Poot, MD;  Location: [facility];  Service: Thoracic;  Laterality: Left;  . RADICAL RETROPUBIC PROSTATECTOMY W/ BILATERAL PELVIC LYMPH NODE DISSECTION  11-28-2002   dr Mattie Marlin  [facility]  . TONSILLECTOMY    . TOTAL HIP ARTHROPLASTY Left 05/12/2016   Procedure: LEFT TOTAL HIP ARTHROPLASTY ANTERIOR APPROACH;  Surgeon: Paralee Cancel, MD;  Location: [facility];  Service: Orthopedics;  Laterality: Left;  . TOTAL HIP ARTHROPLASTY Right 07-15-2006   dr Alvan Dame  [facility]  . TRANSTHORACIC ECHOCARDIOGRAM  07/20/2017   mild LVH, ef 45-50%, hypokinesis of the basal-midinferior myocardium, due to AFib unable to evaluate diastolic function/  severe LAE and RAE/  trivial PR and TR  . VIDEO ASSISTED THORACOSCOPY Left 11/30/2017   Procedure: VIDEO ASSISTED THORACOSCOPY;  Surgeon: Ivin Poot, MD;  Location: [facility];  Service: Thoracic;  Laterality: Left;  [name] VIDEO ASSISTED THORACOSCOPY (VATS)/EMPYEMA Left 11/29/2017   Procedure: VIDEO ASSISTED THORACOSCOPY (VATS)/EMPYEMA;  Surgeon: Ivin Poot, MD;  Location: [facility];  Service: Thoracic;  Laterality: Left;    There were no vitals filed for this visit.  Subjective Assessment - 07/28/18 1837    Subjective  Frustrated that he feels he will always need to use a rollator--however verbalizes he knows his walking and balance are much better with rollator.     Pertinent History  Left THA 05/12/16, right THA 2008, CHF, MI, DM2, HTN, OA, prostate CA, shingles, A-fib, hearing loss, knee sg, recent thorascopy.  05/02/2017- bleeding and began with urinary incontinence    Patient Stated Goals  work on balance, "be able to walk, get off the walker (I'll live with a cane)".  Patient  has used the walker since January when he fell.    Currently in Pain?  No/denies                       [facility] Adult PT Treatment/Exercise - 07/28/18 1031      Ambulation/Gait   Ambulation/Gait Assistance  6: Modified independent (Device/Increase time)    Ambulation/Gait Assistance Details  with rollator RW    Ambulation Distance (Feet)  100 Feet   200   Gait Pattern  Step-through pattern;Decreased stride length;Trunk flexed    Ambulation Surface  Level;Indoor    Gait velocity  32.8/12.8=2.56   fastest 32.8/11.07=2.96         Balance Exercises - 07/28/18 1840      Balance Exercises: Standing   Standing Eyes Opened  Narrow  base of support (BOS);Wide (BOA);Head turns;Foam/compliant surface;Solid surface    Standing Eyes Closed  Narrow base of support (BOS);Wide (BOA);Head turns;Solid surface;Foam/compliant surface    SLS  Eyes open;Upper extremity support 1    Partial Tandem Stance  Eyes open;Eyes closed;Intermittent upper extremity support   EO head turns: rt/lt, up/down, diagonals       PT Education - 07/28/18 1844    Education Details  reviewed understanding of HEP; importance of continuing to do balance exercises (may still improve, at least maintain, and if stops will decline and be at higher fall risk); discussed scheduling time for exercise just like he schedules his MD appts    Person(s) Educated  Patient    Methods  Explanation    Comprehension  Verbalized understanding       PT Short Term Goals - 06/29/18 1449      PT SHORT TERM GOAL #1   Title  Patient will complete HEP with supervision (as needed for technique) Target all STGs 07/02/2018    Time  4    Period  Weeks    Status  Achieved      PT SHORT TERM GOAL #2   Title  The patient will improve gait speed from 1.46 ft/sec (rollator) to > or equal to 1.81 ft/sec to indicate lesser fall risk.    Baseline  06/29/18 2.55 ft/sec rollator    Time  4    Period  Weeks    Status  Achieved      PT  SHORT TERM GOAL #3   Title  Pateint will improve 5xSTS time to <25 sec (using bil UEs as on evaluation)    Baseline  06/24/18  13.6 sec, bil UEs from chair    Time  4    Period  Weeks    Status  Achieved      PT SHORT TERM GOAL #4   Title  Patient will complete 6 MWT with vital signs stable and distance >=500 ft    Baseline  572 ft in 6 minutes with RW mod indep.    Period  Weeks    Status  Achieved        PT Long Term Goals - 07/28/18 1845      PT LONG TERM GOAL #1   Title  The patient will return demo progression of HEP and verbalize plan for ongoing wellness plan.  (LTGs due date: 08/01/2018)    Baseline  07/28/18 return demo'd HEP; required assist with plan for ongoing exercise    Status  Partially Met      PT LONG TERM GOAL #2   Title  Patient will improve gait velocity to >= 2.4 ft/sec with rollator, indicating lesser fall risk.     Baseline  07/28/18 normal speed 2.56 ft/sec; fastest, safe speed 2.96 ft/sec    Time  8    Period  Weeks    Status  Achieved      PT LONG TERM GOAL #3   Title  The patient will perform 5 time sit<>stand without UE support in < or equal to 20 seconds.    Baseline  11.19 seconds on 07/25/2018  (32.1 on 06/02/18 during re-eval).    Time  8    Period  Weeks    Status  Achieved      PT LONG TERM GOAL #4   Title  Patient will improve 6 MWT distance (measured at STGs) by 190 ft    Baseline  Improved from 575 ft  up to 860 ft.    Time  8    Period  Weeks    Status  Achieved            Plan - 07/28/18 1847    Clinical Impression Statement  Final 2 LTGs assessed with patient ultimately meeting 3 of 4 goals (1 goal partially met). Heavy emphasis on educating pt of importance of maintaining exercise upon discharge. Patient agrees with plan to continue to use rollator and to discharge from PT today.     PT Treatment/Interventions  ADLs/Self Care Home Management;DME Instruction;Gait training;Stair training;Functional mobility training;Therapeutic  exercise;Therapeutic activities;Balance training;Neuromuscular re-education;Patient/family education;Vestibular;Canalith Repostioning    Consulted and Agree with Plan of Care  Patient       Patient will benefit from skilled therapeutic intervention in order to improve the following deficits and impairments:  Abnormal gait, Cardiopulmonary status limiting activity, Decreased activity tolerance, Decreased balance, Decreased endurance, Decreased knowledge of use of DME, Decreased mobility, Difficulty walking, Decreased strength, Impaired flexibility, Impaired perceived functional ability, Obesity, Postural dysfunction, Pain  Visit Diagnosis: Other abnormalities of gait and mobility  Unsteadiness on feet  Abnormal posture     Problem List Patient Active Problem List   Diagnosis Date Noted  . CAD (coronary artery disease) 05/21/2018  . Asthma exacerbation 05/21/2018  . Aspiration pneumonia ([place]) 05/21/2018  . Elevated INR   . Shoulder strain, right, initial encounter   . Acute respiratory failure with hypoxia ([place]) 05/20/2018  . Encounter for therapeutic drug monitoring 12/20/2017  . S/P thoracotomy 11/29/2017  . Sepsis ([place])   . Hypoxemia   . Pneumonia of left lower lobe due to infectious organism ([place])   . Mediastinal lymphadenopathy   . Chronic cough 11/23/2017  . CLL (chronic lymphocytic leukemia) ([place]) 11/23/2017  . Pleural effusion 11/23/2017  . HCAP (healthcare-associated pneumonia) 11/20/2017  . Anemia 11/20/2017  . Ischemic cardiomyopathy 09/01/2017  . Right hip pain   . Acute otitis media   . Chronic systolic CHF (congestive heart failure) ([place])   . Pressure injury of skin 07/20/2017  . Malignant otitis externa 07/19/2017  . Fall at home, initial encounter 07/19/2017  . S/P left THA, AA 05/12/2016  . LPRD (laryngopharyngeal reflux disease) 05/21/2015  . Other allergic rhinitis 05/21/2015  . Obesity 12/12/2014  . Upper airway cough syndrome 12/11/2014  . Cough  variant asthma 07/27/2014  . Wheezing 06/20/2014  . Symptomatic bradycardia - s/p Biotronik (serial number [id number]) 02/08/2014  . Diabetes mellitus type 2, noninsulin dependent ([place])   . Edema of foot 03/06/2013  . Traumatic ecchymosis of right foot 03/06/2013  . Bone spur 12/30/2012  . Pain of toe of left foot 12/30/2012  . Hyperlipidemia 07/13/2012  . HTN (hypertension) 04/12/2012  . Sick sinus syndrome ([place]) 08/17/2011  . PAF (paroxysmal atrial fibrillation) ([place]) 09/16/2010   PHYSICAL THERAPY DISCHARGE SUMMARY  Visits from Start of Care: 15  Current functional level related to goals / functional outcomes: See LTGs above   Remaining deficits: Impaired vestibular system s/p rt hearing loss   Education / Equipment: HEP, use of rollator  Plan: Patient agrees to discharge.  Patient goals were partially met. Patient is being discharged due to meeting the stated rehab goals.  ?????       Rexanne Mano, PT 07/28/2018, 6:50 PM  [facility] [address] [place], [name], [address] Phone: [phone]   Fax:  [phone]  Name: Oscar Baker MRN: [id number] Date of Birth: 07-08-1932

## 2018-08-02 ENCOUNTER — Encounter: Payer: Self-pay | Admitting: Internal Medicine

## 2018-08-02 ENCOUNTER — Ambulatory Visit (INDEPENDENT_AMBULATORY_CARE_PROVIDER_SITE_OTHER): Payer: Medicare Other | Admitting: Internal Medicine

## 2018-08-02 VITALS — BP 130/72 | HR 74 | Ht 70.0 in | Wt 204.0 lb

## 2018-08-02 DIAGNOSIS — I480 Paroxysmal atrial fibrillation: Secondary | ICD-10-CM | POA: Diagnosis not present

## 2018-08-02 DIAGNOSIS — Z950 Presence of cardiac pacemaker: Secondary | ICD-10-CM | POA: Diagnosis not present

## 2018-08-02 DIAGNOSIS — I495 Sick sinus syndrome: Secondary | ICD-10-CM

## 2018-08-02 DIAGNOSIS — E119 Type 2 diabetes mellitus without complications: Secondary | ICD-10-CM | POA: Insufficient documentation

## 2018-08-02 DIAGNOSIS — I5021 Acute systolic (congestive) heart failure: Secondary | ICD-10-CM | POA: Insufficient documentation

## 2018-08-02 NOTE — Progress Notes (Signed)
HPI Oscar Baker returns today for ongoing evaluation and management of atrial fibrillation and symptomatic bradycardia status post pacemaker insertion. He denies chest pain or shortness of breath. He does have some swelling in his legs. He has been in the hospital on multiple occaisions due to chronic aspiration. He has had a chest tube as well. He feels better. He has been more sedentary and he admits to dietary indiscretion with sodium.  Allergies  Allergen Reactions  . Altace [Ramipril] Other (See Comments)    "throat felt like had a knot in it"  . Augmentin [Amoxicillin-Pot Clavulanate] Diarrhea    severe  . Codeine Nausea And Vomiting    Nausea and vomiting   . Simvastatin Other (See Comments)    Leg aches     Current Outpatient Medications  Medication Sig Dispense Refill  . acetaminophen (TYLENOL) 500 MG tablet Take 2 tablets (1,000 mg total) by mouth every 6 (six) hours. 30 tablet 0  . atorvastatin (LIPITOR) 20 MG tablet TAKE ONE TABLET EACH DAY AT 6PM 90 tablet 3  . carvedilol (COREG) 3.125 MG tablet Take 1 tablet (3.125 mg total) by mouth 2 (two) times daily. 180 tablet 3  . fenofibrate micronized (LOFIBRA) 200 MG capsule TAKE 1 CAPSULE BY MOUTH DAILY BEFORE BREAKFAST 90 capsule 3  . furosemide (LASIX) 20 MG tablet Take 20 mg by mouth daily as needed for fluid or edema.    [name] guaiFENesin (MUCINEX) 600 MG 12 hr tablet Take 600 mg by mouth as needed.     [name] HYDROcodone-homatropine (HYCODAN) 5-1.5 MG/5ML syrup Take 5 mLs by mouth every 6 (six) hours as needed for cough. 240 mL 0  . loratadine (CLARITIN) 10 MG tablet Take 1 tablet (10 mg total) by mouth daily. 30 tablet 11  . metFORMIN (GLUCOPHAGE) 1000 MG tablet Take 1,000 mg by mouth 2 (two) times daily with a meal.      . multivitamin (THERAGRAN) per tablet Take 1 tablet by mouth daily. Will stop prior to procedure    . sitaGLIPtin (JANUVIA) 100 MG tablet Take 100 mg by mouth daily.    . vitamin B-12 (CYANOCOBALAMIN) 1000  MCG tablet Take 1,000 mcg by mouth daily. Will stop prior to procedure    . warfarin (COUMADIN) 5 MG tablet TAKE AS DIRECTED BY COUMADIN CLINIC 40 tablet 3  . glipiZIDE (GLUCOTROL XL) 2.5 MG 24 hr tablet Take 2.5 mg by mouth daily with breakfast.     No current facility-administered medications for this visit.      Past Medical History:  Diagnosis Date  . Asthma 1950's   history of  . Atrial fibrillation ([place])   . Bilateral carpal tunnel syndrome   . Bilateral lower extremity edema   . Bladder tumor   . Chronic systolic heart failure (HCC)    Echo 1/19: Mild LVH, EF 45-50, inf HK, MAC, severe LAE, severe RAE // Echo 7/15: Mild LVH, mod focal basal sept hypertrophy, EF 55-60, AV peak and mean 16/9, trivial MR, mod LAE, PASP 38  . CLL (chronic lymphocytic leukemia) [facility]) oncologist-  dr Ilene Qua--   dx [phone] ;  Lymphocytosis, CLL - per lov note 05-11-2017 currently under active survillance,  CT 04-17-2014 show very small lymphadenopathy, no indication for treatment  . Coronary artery disease    cardiologist-  dr Cathie Olden--  08-18-2017 Intermittant risk nuclear study w/ large area of inferior infartion with no evidence ishcemia   . 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 Diabetes  mellitus type 2, noninsulin dependent ([place])   . Elevated PSA    since prostatectomy but now resolved  . Hematuria 04/2017  . History of ear infection    Right  . History of MI (myocardial infarction)    per myoview nuclear study 08-18-2017 , unknown when  . History of shingles 08/2017   L ear and scalp, possible  . Hyperlipidemia   . Hypertension   . Ischemic cardiomyopathy 09/01/2017   Presumed +CAD with Nuclear stress test 08/18/17 - Inferior scar, no ischemia, intermediate risk // med management unless +angina or worse dyspnea  . OA (osteoarthritis)   . Pacemaker 02/08/2014   followed by dr g. Onyinyechi Huante--  single chamber Biotronik due to SSS  . Permanent atrial fibrillation   . Pneumonia 2019   Left lung  . Prostate  cancer [facility]) urologist-  dr Diona Fanti   dx 2004--  Gleason 8, PSA 10.45--  11-28-2002  s/p  radical prostatectomy;  recurrent w/ increasing PSA, started ADT treatment  . RBBB (right bundle branch block)   . Sick sinus syndrome ([place])    a-Flutter with episodes of bradycardia; S/P Biotronik (serial number [id number]) 02-08-2014  . Urinary incontinence   . Wears hearing aid in right ear    receiver and transmitter    ROS:   All systems reviewed and negative except as noted in the HPI.   Past Surgical History:  Procedure Laterality Date  . APPENDECTOMY    . BACK SURGERY     disk  . CARDIAC CATHETERIZATION  09-03-1999  dr Cathie Olden   abnormal cardiolite study:  minor luminal irregularities but no critial coronary artery stenosis  . CARDIOVASCULAR STRESS TEST  08-18-2017  dr Cathie Olden   Intermediate risk nuclear study w/ large area inferior infarction, no evidence of ishcemia (consistant w/ prior MI)/  study not gated due to frequent PVCs  . CARPAL TUNNEL RELEASE Right 2000  . CARPAL TUNNEL RELEASE Left 11/19/2009  . CATARACT EXTRACTION Right 07/2015  . CATARACT EXTRACTION Left 09/2015  . CYSTOSCOPY N/A 11/04/2017   Procedure: CYSTOSCOPY AND CAUTERIZATION OF BLADDER;  Surgeon: Franchot Gallo, MD;  Location: [facility];  Service: Urology;  Laterality: N/A;  . INSERTION PENILE PROSTHESIS  02-22-2004    dr Mattie Marlin  [facility]  . IR THORACENTESIS ASP PLEURAL SPACE W/IMG GUIDE  11/26/2017  . KNEE ARTHROSCOPY Left 07/2010  . PERMANENT PACEMAKER INSERTION N/A 02/08/2014   Procedure: PERMANENT PACEMAKER INSERTION;  Surgeon: Evans Lance, MD;  Location: [name] CATH LAB;  Service: Cardiovascular;  Laterality: N/A;  . PLEURAL EFFUSION DRAINAGE Left 11/30/2017   Procedure: DRAINAGE OF HEMOTHORAX;  Surgeon: Ivin Poot, MD;  Location: [facility];  Service: Thoracic;  Laterality: Left;  . RADICAL RETROPUBIC PROSTATECTOMY W/ BILATERAL PELVIC LYMPH NODE DISSECTION  11-28-2002   dr Mattie Marlin  [facility]  .  TONSILLECTOMY    . TOTAL HIP ARTHROPLASTY Left 05/12/2016   Procedure: LEFT TOTAL HIP ARTHROPLASTY ANTERIOR APPROACH;  Surgeon: Paralee Cancel, MD;  [facility];  Service: Orthopedics;  Laterality: Left;  . TOTAL HIP ARTHROPLASTY Right 07-15-2006   dr Alvan Dame  [facility]  . TRANSTHORACIC ECHOCARDIOGRAM  07/20/2017   mild LVH, ef 45-50%, hypokinesis of the basal-midinferior myocardium, due to AFib unable to evaluate diastolic function/  severe LAE and RAE/  trivial PR and TR  . VIDEO ASSISTED THORACOSCOPY Left 11/30/2017   Procedure: VIDEO ASSISTED THORACOSCOPY;  Surgeon: Ivin Poot, MD;  Location: [place];  Service: Thoracic;  Laterality:  Left;  . VIDEO ASSISTED THORACOSCOPY (VATS)/EMPYEMA Left 11/29/2017   Procedure: VIDEO ASSISTED THORACOSCOPY (VATS)/EMPYEMA;  Surgeon: Ivin Poot, MD;  Location: [facility] OR;  Service: Thoracic;  Laterality: Left;     Family History  Problem Relation Age of Onset  . Emphysema Mother   . Allergic rhinitis Mother   . Heart attack Father   . Allergic rhinitis Brother   . Pulmonary fibrosis Brother      Social History   Socioeconomic History  . Marital status: Married    Spouse name: Not on file  . Number of children: Not on file  . Years of education: Not on file  . Highest education level: Not on file  Occupational History  . Not on file  Social Needs  . Financial resource strain: Not on file  . Food insecurity:    Worry: Not on file    Inability: Not on file  . Transportation needs:    Medical: Not on file    Non-medical: Not on file  Tobacco Use  . Smoking status: Former Smoker    Years: 25.00    Types: Pipe, Cigars    Last attempt to quit: 11/25/1975    Years since quitting: 42.7  . Smokeless tobacco: Never Used  Substance and Sexual Activity  . Alcohol use: Yes    Alcohol/week: 0.0 standard drinks    Comment: occasional  . Drug use: No  . Sexual activity: Not on file  Lifestyle  . Physical activity:    Days per week: Not on file      Minutes per session: Not on file  . Stress: Not on file  Relationships  . Social connections:    Talks on phone: Not on file    Gets together: Not on file    Attends religious service: Not on file    Active member of club or organization: Not on file    Attends meetings of clubs or organizations: Not on file    Relationship status: Not on file  . Intimate partner violence:    Fear of current or ex partner: Not on file    Emotionally abused: Not on file    Physically abused: Not on file    Forced sexual activity: Not on file  Other Topics Concern  . Not on file  Social History Narrative  . Not on file     BP 130/72   Pulse 74   Ht _0  (1.778 m)   Wt 204 lb (92.5 kg)   BMI 29.27 kg/m   Physical Exam:  Well appearing NAD HEENT: Unremarkable Neck:  No JVD, no thyromegally Lymphatics:  No adenopathy Back:  No CVA tenderness Lungs:  Clear with no wheezes HEART:  Regular rate rhythm, no murmurs, no rubs, no clicks Abd:  soft, positive bowel sounds, no organomegally, no rebound, no guarding Ext:  2 plus pulses, no edema, no cyanosis, no clubbing Skin:  No rashes no nodules Neuro:  CN II through XII intact, motor grossly intact  EKG - atrial fib with ventricular pacing  DEVICE  Normal device function.  See PaceArt for details.   Assess/Plan: 1. Atrial fib - his VR is well controlled.  2. PPM - his biotronik PM is working normally on interrogation today. 3. Chronic diastolic heart failure - his symptoms are class 2. He admits to dietary indiscretion. I have encouraged him. He will continue his current meds.  4. HTN - his blood pressure is well controlled. He will continue his  current meds.  Mikle Bosworth.D.

## 2018-08-02 NOTE — Patient Instructions (Signed)
Medication Instructions:  Your physician recommends that you continue on your current medications as directed. Please refer to the Current Medication list given to you today.  Labwork: None ordered.  Testing/Procedures: None ordered.  Follow-Up: Your physician wants you to follow-up in: one year with Dr. Lovena Le.   You will receive a reminder letter in the mail two months in advance. If you don't receive a letter, please call our office to schedule the follow-up appointment.  Remote monitoring is used to monitor your Pacemaker from home. This monitoring reduces the number of office visits required to check your device to one time per year. It allows [place] to keep an eye on the functioning of your device to ensure it is working properly. You are scheduled for a device check from home on 10/10/2018. You may send your transmission at any time that day. If you have a wireless device, the transmission will be sent automatically. After your physician reviews your transmission, you will receive a postcard with your next transmission date.  Any Other Special Instructions Will Be Listed Below (If Applicable).  If you need a refill on your cardiac medications before your next appointment, please call your pharmacy.

## 2018-08-15 DIAGNOSIS — R1312 Dysphagia, oropharyngeal phase: Secondary | ICD-10-CM | POA: Diagnosis not present

## 2018-08-15 DIAGNOSIS — J479 Bronchiectasis, uncomplicated: Secondary | ICD-10-CM | POA: Diagnosis not present

## 2018-08-15 DIAGNOSIS — J411 Mucopurulent chronic bronchitis: Secondary | ICD-10-CM | POA: Diagnosis not present

## 2018-08-15 DIAGNOSIS — R05 Cough: Secondary | ICD-10-CM | POA: Diagnosis not present

## 2018-08-15 DIAGNOSIS — J690 Pneumonitis due to inhalation of food and vomit: Secondary | ICD-10-CM | POA: Diagnosis not present

## 2018-08-22 DIAGNOSIS — J411 Mucopurulent chronic bronchitis: Secondary | ICD-10-CM | POA: Insufficient documentation

## 2018-08-23 ENCOUNTER — Ambulatory Visit (INDEPENDENT_AMBULATORY_CARE_PROVIDER_SITE_OTHER): Payer: Medicare Other | Admitting: *Deleted

## 2018-08-23 DIAGNOSIS — I480 Paroxysmal atrial fibrillation: Secondary | ICD-10-CM

## 2018-08-23 DIAGNOSIS — I4821 Permanent atrial fibrillation: Secondary | ICD-10-CM | POA: Diagnosis not present

## 2018-08-23 DIAGNOSIS — Z5181 Encounter for therapeutic drug level monitoring: Secondary | ICD-10-CM

## 2018-08-23 LAB — POCT INR: INR: 2.6 (ref 2.0–3.0)

## 2018-08-23 NOTE — Patient Instructions (Signed)
Description   Continue 1 tablet daily except 1.5 tablets on Saturdays. Recheck in 4 weeks.  Call Coumadin Clinic [phone] with any concerns.

## 2018-08-31 DIAGNOSIS — L219 Seborrheic dermatitis, unspecified: Secondary | ICD-10-CM | POA: Diagnosis not present

## 2018-08-31 DIAGNOSIS — B958 Unspecified staphylococcus as the cause of diseases classified elsewhere: Secondary | ICD-10-CM | POA: Diagnosis not present

## 2018-09-07 ENCOUNTER — Other Ambulatory Visit: Payer: Self-pay | Admitting: Cardiovascular Disease

## 2018-09-07 ENCOUNTER — Other Ambulatory Visit: Payer: Self-pay | Admitting: Physician Assistant

## 2018-09-09 ENCOUNTER — Ambulatory Visit (INDEPENDENT_AMBULATORY_CARE_PROVIDER_SITE_OTHER): Payer: Medicare Other | Admitting: Podiatry

## 2018-09-09 ENCOUNTER — Other Ambulatory Visit: Payer: Self-pay

## 2018-09-09 DIAGNOSIS — B351 Tinea unguium: Secondary | ICD-10-CM

## 2018-09-09 DIAGNOSIS — E119 Type 2 diabetes mellitus without complications: Secondary | ICD-10-CM

## 2018-09-09 DIAGNOSIS — Z9229 Personal history of other drug therapy: Secondary | ICD-10-CM | POA: Diagnosis not present

## 2018-09-09 NOTE — Patient Instructions (Signed)
Diabetes Mellitus and Foot Care Foot care is an important part of your health, especially when you have diabetes. Diabetes may cause you to have problems because of poor blood flow (circulation) to your feet and legs, which can cause your skin to:  Become thinner and drier.  Break more easily.  Heal more slowly.  Peel and crack. You may also have nerve damage (neuropathy) in your legs and feet, causing decreased feeling in them. This means that you may not notice minor injuries to your feet that could lead to more serious problems. Noticing and addressing any potential problems early is the best way to prevent future foot problems. How to care for your feet Foot hygiene  Wash your feet daily with warm water and mild soap. Do not use hot water. Then, pat your feet and the areas between your toes until they are completely dry. Do not soak your feet as this can dry your skin.  Trim your toenails straight across. Do not dig under them or around the cuticle. File the edges of your nails with an emery board or nail file.  Apply a moisturizing lotion or petroleum jelly to the skin on your feet and to dry, brittle toenails. Use lotion that does not contain alcohol and is unscented. Do not apply lotion between your toes. Shoes and socks  Wear clean socks or stockings every day. Make sure they are not too tight. Do not wear knee-high stockings since they may decrease blood flow to your legs.  Wear shoes that fit properly and have enough cushioning. Always look in your shoes before you put them on to be sure there are no objects inside.  To break in new shoes, wear them for just a few hours a day. This prevents injuries on your feet. Wounds, scrapes, corns, and calluses  Check your feet daily for blisters, cuts, bruises, sores, and redness. If you cannot see the bottom of your feet, use a mirror or ask someone for help.  Do not cut corns or calluses or try to remove them with medicine.  If you  find a minor scrape, cut, or break in the skin on your feet, keep it and the skin around it clean and dry. You may clean these areas with mild soap and water. Do not clean the area with peroxide, alcohol, or iodine.  If you have a wound, scrape, corn, or callus on your foot, look at it several times a day to make sure it is healing and not infected. Check for: ? Redness, swelling, or pain. ? Fluid or blood. ? Warmth. ? Pus or a bad smell. General instructions  Do not cross your legs. This may decrease blood flow to your feet.  Do not use heating pads or hot water bottles on your feet. They may burn your skin. If you have lost feeling in your feet or legs, you may not know this is happening until it is too late.  Protect your feet from hot and cold by wearing shoes, such as at the beach or on hot pavement.  Schedule a complete foot exam at least once a year (annually) or more often if you have foot problems. If you have foot problems, report any cuts, sores, or bruises to your health care provider immediately. Contact a health care provider if:  You have a medical condition that increases your risk of infection and you have any cuts, sores, or bruises on your feet.  You have an injury that is not   healing.  You have redness on your legs or feet.  You feel burning or tingling in your legs or feet.  You have pain or cramps in your legs and feet.  Your legs or feet are numb.  Your feet always feel cold.  You have pain around a toenail. Get help right away if:  You have a wound, scrape, corn, or callus on your foot and: ? You have pain, swelling, or redness that gets worse. ? You have fluid or blood coming from the wound, scrape, corn, or callus. ? Your wound, scrape, corn, or callus feels warm to the touch. ? You have pus or a bad smell coming from the wound, scrape, corn, or callus. ? You have a fever. ? You have a red line going up your leg. Summary  Check your feet every day  for cuts, sores, red spots, swelling, and blisters.  Moisturize feet and legs daily.  Wear shoes that fit properly and have enough cushioning.  If you have foot problems, report any cuts, sores, or bruises to your health care provider immediately.  Schedule a complete foot exam at least once a year (annually) or more often if you have foot problems. This information is not intended to replace advice given to you by your health care provider. Make sure you discuss any questions you have with your health care provider. Document Released: 06/05/2000 Document Revised: 07/21/2017 Document Reviewed: 07/10/2016 Elsevier Interactive Patient Education  2019 [employer]  Onychomycosis/Fungal Toenails  WHAT IS IT? An infection that lies within the keratin of your nail plate that is caused by a fungus.  WHY ME? Fungal infections affect all ages, sexes, races, and creeds.  There may be many factors that predispose you to a fungal infection such as age, coexisting medical conditions such as diabetes, or an autoimmune disease; stress, medications, fatigue, genetics, etc.  Bottom line: fungus thrives in a warm, moist environment and your shoes offer such a location.  IS IT CONTAGIOUS? Theoretically, yes.  You do not want to share shoes, nail clippers or files with someone who has fungal toenails.  Walking around barefoot in the same room or sleeping in the same bed is unlikely to transfer the organism.  It is important to realize, however, that fungus can spread easily from one nail to the next on the same foot.  HOW DO WE TREAT THIS?  There are several ways to treat this condition.  Treatment may depend on many factors such as age, medications, pregnancy, liver and kidney conditions, etc.  It is best to ask your doctor which options are available to you.  1. No treatment.   Unlike many other medical concerns, you can live with this condition.  However for many people this can be a painful condition and  may lead to ingrown toenails or a bacterial infection.  It is recommended that you keep the nails cut short to help reduce the amount of fungal nail. 2. Topical treatment.  These range from herbal remedies to prescription strength nail lacquers.  About 40-50% effective, topicals require twice daily application for approximately 9 to 12 months or until an entirely new nail has grown out.  The most effective topicals are medical grade medications available through physicians offices. 3. Oral antifungal medications.  With an 80-90% cure rate, the most common oral medication requires 3 to 4 months of therapy and stays in your system for a year as the new nail grows out.  Oral antifungal medications do require   blood work to make sure it is a safe drug for you.  A liver function panel will be performed prior to starting the medication and after the first month of treatment.  It is important to have the blood work performed to avoid any harmful side effects.  In general, this medication safe but blood work is required. 4. Laser Therapy.  This treatment is performed by applying a specialized laser to the affected nail plate.  This therapy is noninvasive, fast, and non-painful.  It is not covered by insurance and is therefore, out of pocket.  The results have been very good with a 80-95% cure rate.  [facility] is the only practice in the area to offer this therapy. 5. Permanent Nail Avulsion.  Removing the entire nail so that a new nail will not grow back. 

## 2018-09-15 ENCOUNTER — Encounter: Payer: Self-pay | Admitting: Podiatry

## 2018-09-15 NOTE — Progress Notes (Signed)
Subjective: Oscar Baker presents today referred by [name], MD with cc of longstanding painful, discolored, thick toenails which interfere with daily activities.  Pain is aggravated when wearing enclosed shoe gear.   He has seen Podiatrist in the past, but his previous doctor retired.  Past Medical History:  Diagnosis Date  . Asthma 1950's   history of  . Atrial fibrillation ([place])   . Bilateral carpal tunnel syndrome   . Bilateral lower extremity edema   . Bladder tumor   . Chronic systolic heart failure (HCC)    Echo 1/19: Mild LVH, EF 45-50, inf HK, MAC, severe LAE, severe RAE // Echo 7/15: Mild LVH, mod focal basal sept hypertrophy, EF 55-60, AV peak and mean 16/9, trivial MR, mod LAE, PASP 38  . CLL (chronic lymphocytic leukemia) [facility]) oncologist-  dr Ilene Qua--   dx [phone] ;  Lymphocytosis, CLL - per lov note 05-11-2017 currently under active survillance,  CT 04-17-2014 show very small lymphadenopathy, no indication for treatment  . Coronary artery disease    cardiologist-  dr Cathie Olden--  08-18-2017 Intermittant risk nuclear study w/ large area of inferior infartion with no evidence ishcemia   . 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 Diabetes mellitus type 2, noninsulin dependent ([place])   . Elevated PSA    since prostatectomy but now resolved  . Hematuria 04/2017  . History of ear infection    Right  . History of MI (myocardial infarction)    per myoview nuclear study 08-18-2017 , unknown when  . History of shingles 08/2017   L ear and scalp, possible  . Hyperlipidemia   . Hypertension   . Ischemic cardiomyopathy 09/01/2017   Presumed +CAD with Nuclear stress test 08/18/17 - Inferior scar, no ischemia, intermediate risk // med management unless +angina or worse dyspnea  . OA (osteoarthritis)   . Pacemaker 02/08/2014   followed by dr g. taylor--  single chamber Biotronik due to SSS  . Permanent atrial fibrillation   . Pneumonia 2019   Left lung  . Prostate cancer [facility]) urologist-  dr  Diona Fanti   dx 2004--  Gleason 8, PSA 10.45--  11-28-2002  s/p  radical prostatectomy;  recurrent w/ increasing PSA, started ADT treatment  . RBBB (right bundle branch block)   . Sick sinus syndrome ([place])    a-Flutter with episodes of bradycardia; S/P Biotronik (serial number [id number]) 02-08-2014  . Urinary incontinence   . Wears hearing aid in right ear    receiver and transmitter     Patient Active Problem List   Diagnosis Date Noted  . Chronic bronchitis with productive mucopurulent cough ([place]) 08/22/2018  . Acute systolic heart failure ([place]) 08/02/2018  . Type 2 diabetes mellitus ([place]) 08/02/2018  . Pain in joint of right shoulder 06/08/2018  . CAD (coronary artery disease) 05/21/2018  . Asthma exacerbation 05/21/2018  . Aspiration pneumonia ([place]) 05/21/2018  . Elevated INR   . Shoulder strain, right, initial encounter   . Acute respiratory failure with hypoxia ([place]) 05/20/2018  . Bilateral impacted cerumen 02/14/2018  . Asymmetric SNHL (sensorineural hearing loss) 02/14/2018  . Encounter for therapeutic drug monitoring 12/20/2017  . S/P thoracotomy 11/29/2017  . Sepsis ([place])   . Hypoxemia   . Pneumonia of left lower lobe due to infectious organism ([place])   . Mediastinal lymphadenopathy   . Chronic cough 11/23/2017  . CLL (chronic lymphocytic leukemia) ([place]) 11/23/2017  . Pleural effusion 11/23/2017  . HCAP (healthcare-associated pneumonia) 11/20/2017  . Anemia 11/20/2017  .  Mixed conductive and sensorineural hearing loss of right ear with restricted hearing of left ear 09/14/2017  . Abnormal hearing test 09/06/2017  . Cellulitis 09/06/2017  . Lymphadenitis, acute 09/06/2017  . Ischemic cardiomyopathy 09/01/2017  . S/p bilateral revision of total hip arthroplasty 08/11/2017  . History of revision of total replacement of left hip joint 08/11/2017  . Right hip pain   . Acute otitis media   . Chronic systolic CHF (congestive heart failure) ([place])   . Pressure injury of  skin 07/20/2017  . Pressure ulcer 07/20/2017  . Malignant otitis externa 07/19/2017  . Fall in home 07/19/2017  . Acute on chronic diastolic heart failure ([place]) 07/19/2017  . S/P left THA, AA 05/12/2016  . History of repair of hip joint 05/12/2016  . Laryngopharyngeal reflux 05/21/2015  . Allergic rhinitis 05/21/2015  . Obesity 12/12/2014  . Upper airway cough syndrome 12/11/2014  . Posterior rhinorrhea 12/11/2014  . Cough variant asthma 07/27/2014  . Wheezing 06/20/2014  . Symptomatic bradycardia - s/p Biotronik (serial number [id number]) 02/08/2014  . Bradycardia 02/08/2014  . Diabetes mellitus type 2, noninsulin dependent ([place])   . Mixed incontinence 03/09/2013  . Edema of foot 03/06/2013  . Traumatic ecchymosis of right foot 03/06/2013  . Contusion of foot 03/06/2013  . Malignant neoplasm of prostate ([place]) 02/15/2013  . Diabetes mellitus without complication ([place]) 37/85/8850  . Bone spur 12/30/2012  . Pain of toe of left foot 12/30/2012  . Hyperlipidemia 07/13/2012  . Hypertensive disorder 04/12/2012  . Sick sinus syndrome ([place]) 08/17/2011  . PAF (paroxysmal atrial fibrillation) ([place]) 09/16/2010     Past Surgical History:  Procedure Laterality Date  . APPENDECTOMY    . BACK SURGERY     disk  . CARDIAC CATHETERIZATION  09-03-1999  dr Cathie Olden   abnormal cardiolite study:  minor luminal irregularities but no critial coronary artery stenosis  . CARDIOVASCULAR STRESS TEST  08-18-2017  dr Cathie Olden   Intermediate risk nuclear study w/ large area inferior infarction, no evidence of ishcemia (consistant w/ prior MI)/  study not gated due to frequent PVCs  . CARPAL TUNNEL RELEASE Right 2000  . CARPAL TUNNEL RELEASE Left 11/19/2009  . CATARACT EXTRACTION Right 07/2015  . CATARACT EXTRACTION Left 09/2015  . CYSTOSCOPY N/A 11/04/2017   Procedure: CYSTOSCOPY AND CAUTERIZATION OF BLADDER;  Surgeon: Franchot Gallo, MD;  Location: [facility];  Service: Urology;   Laterality: N/A;  . INSERTION PENILE PROSTHESIS  02-22-2004    dr Mattie Marlin  [facility]  . IR THORACENTESIS ASP PLEURAL SPACE W/IMG GUIDE  11/26/2017  . KNEE ARTHROSCOPY Left 07/2010  . PERMANENT PACEMAKER INSERTION N/A 02/08/2014   Procedure: PERMANENT PACEMAKER INSERTION;  Surgeon: Evans Lance, MD;  Location: [facility] CATH LAB;  Service: Cardiovascular;  Laterality: N/A;  . PLEURAL EFFUSION DRAINAGE Left 11/30/2017   Procedure: DRAINAGE OF HEMOTHORAX;  Surgeon: Ivin Poot, MD;  Location: [facility];  Service: Thoracic;  Laterality: Left;  . RADICAL RETROPUBIC PROSTATECTOMY W/ BILATERAL PELVIC LYMPH NODE DISSECTION  11-28-2002   dr Mattie Marlin  [facility]  . TONSILLECTOMY    . TOTAL HIP ARTHROPLASTY Left 05/12/2016   Procedure: LEFT TOTAL HIP ARTHROPLASTY ANTERIOR APPROACH;  Surgeon: Paralee Cancel, MD;  Location: [facility] ORS;  Service: Orthopedics;  Laterality: Left;  . TOTAL HIP ARTHROPLASTY Right 07-15-2006   dr Alvan Dame  [facility]  . TRANSTHORACIC ECHOCARDIOGRAM  07/20/2017   mild LVH, ef 45-50%, hypokinesis of the basal-midinferior myocardium, due to AFib unable to evaluate diastolic function/  severe LAE and RAE/  trivial PR and TR  . VIDEO ASSISTED THORACOSCOPY Left 11/30/2017   Procedure: VIDEO ASSISTED THORACOSCOPY;  Surgeon: Ivin Poot, MD;  Location: [facility];  Service: Thoracic;  Laterality: Left;  [name] VIDEO ASSISTED THORACOSCOPY (VATS)/EMPYEMA Left 11/29/2017   Procedure: VIDEO ASSISTED THORACOSCOPY (VATS)/EMPYEMA;  Surgeon: Prescott Gum, Collier Salina, MD;  Location[facility];  Service: Thoracic;  Laterality: Left;      Current Outpatient Medications:  .  acetaminophen (TYLENOL) 500 MG tablet, Take 2 tablets (1,000 mg total) by mouth every 6 (six) hours., Disp: 30 tablet, Rfl: 0 .  atorvastatin (LIPITOR) 20 MG tablet, TAKE ONE TABLET EACH DAY AT 6PM, Disp: 90 tablet, Rfl: 3 .  carvedilol (COREG) 3.125 MG tablet, TAKE ONE TABLET TWICE DAILY, Disp: 180 tablet, Rfl: 3 .  fenofibrate micronized (LOFIBRA) 200 MG capsule, TAKE ONE  CAPSULE EACH DAY BEFORE BREAKFAST, Disp: 90 capsule, Rfl: 2 .  furosemide (LASIX) 20 MG tablet, TAKE ONE TABLET DAILY AS NEEDED, Disp: 90 tablet, Rfl: 3 .  glipiZIDE (GLUCOTROL XL) 2.5 MG 24 hr tablet, Take 2.5 mg by mouth daily with breakfast., Disp: , Rfl:  .  guaiFENesin (MUCINEX) 600 MG 12 hr tablet, Take 600 mg by mouth as needed. , Disp: , Rfl:  .  HYDROcodone-homatropine (HYCODAN) 5-1.5 MG/5ML syrup, Take 5 mLs by mouth every 6 (six) hours as needed for cough., Disp: 240 mL, Rfl: 0 .  ipratropium (ATROVENT) 0.02 % nebulizer solution, Inhale into the lungs., Disp: , Rfl:  .  loratadine (CLARITIN) 10 MG tablet, Take 1 tablet (10 mg total) by mouth daily., Disp: 30 tablet, Rfl: 11 .  metFORMIN (GLUCOPHAGE) 1000 MG tablet, Take 1,000 mg by mouth 2 (two) times daily with a meal.  , Disp: , Rfl:  .  mometasone (ELOCON) 0.1 % lotion, , Disp: , Rfl:  .  multivitamin (THERAGRAN) per tablet, Take 1 tablet by mouth daily. Will stop prior to procedure, Disp: , Rfl:  .  mupirocin ointment (BACTROBAN) 2 %, , Disp: , Rfl:  .  Nebulizers (VIOS LC SPRINT DELUXE) MISC, Use 1 each every 6 (six) hours as needed (shortness of breath or wheezing) Fax to [facility], Disp: , Rfl:  .  sitaGLIPtin (JANUVIA) 100 MG tablet, Take 100 mg by mouth daily., Disp: , Rfl:  .  sodium chloride HYPERTONIC 3 % nebulizer solution, Inhale into the lungs., Disp: , Rfl:  .  vitamin B-12 (CYANOCOBALAMIN) 1000 MCG tablet, Take 1,000 mcg by mouth daily. Will stop prior to procedure, Disp: , Rfl:  .  warfarin (COUMADIN) 5 MG tablet, TAKE AS DIRECTED BY COUMADIN CLINIC, Disp: 120 tablet, Rfl: 0   Allergies  Allergen Reactions  . Altace [Ramipril] Other (See Comments)    "throat felt like had a knot in it"  . Augmentin [Amoxicillin-Pot Clavulanate] Diarrhea    severe  . Codeine Nausea And Vomiting    Nausea and vomiting   . Simvastatin Other (See Comments)    Leg aches     Social History   Occupational History  . Not on file   Tobacco Use  . Smoking status: Former Smoker    Years: 25.00    Types: Pipe, Cigars    Last attempt to quit: 11/25/1975    Years since quitting: 42.8  . Smokeless tobacco: Never Used  Substance and Sexual Activity  . Alcohol use: Yes    Alcohol/week: 0.0 standard drinks    Comment: occasional  . Drug use: No  .  Sexual activity: Not on file     Family History  Problem Relation Age of Onset  . Emphysema Mother   . Allergic rhinitis Mother   . Heart attack Father   . Allergic rhinitis Brother   . Pulmonary fibrosis Brother      Immunization History  Administered Date(s) Administered  . Tdap 07/19/2017     Review of systems: Positive Findings in bold print.  Constitutional:  chills, fatigue, fever, sweats, weight change Communication: Optometrist, sign Ecologist, hand writing, iPad/Android device Head: headaches, head injury Eyes: changes in vision, eye pain, glaucoma, cataracts, macular degeneration, diplopia, glare,  light sensitivity, eyeglasses or contacts, blindness Ears nose mouth throat: hearing impaired, hearing aids,  ringing in ears, deaf, sign language,  vertigo,   nosebleeds,  rhinitis,  cold sores, snoring, swollen glands Cardiovascular: HTN, edema, arrhythmia, pacemaker in place, defibrillator in place, chest pain/tightness, chronic anticoagulation, blood clot, heart failure, MI Peripheral Vascular: leg cramps, varicose veins, blood clots, lymphedema, varicosities Respiratory:  difficulty breathing, denies congestion, SOB, wheezing, cough, emphysema Gastrointestinal: change in appetite or weight, abdominal pain, constipation, diarrhea, nausea, vomiting, vomiting blood, change in bowel habits, abdominal pain, jaundice, rectal bleeding, hemorrhoids, GERD Genitourinary:  nocturia,  pain on urination, polyuria,  blood in urine, Foley catheter, urinary urgency, ESRD on hemodialysis Musculoskeletal: amputation, cramping, stiff joints, painful joints, decreased  joint motion, fractures, OA, gout, hemiplegia, paraplegia, uses cane, wheelchair bound, uses walker, uses rollator Skin: +changes in toenails, color change, dryness, itching, mole changes,  rash, wound(s) Neurological: headaches, numbness in feet, paresthesias in feet, burning in feet, fainting,  seizures, change in speech. denies headaches, memory problems/poor historian, cerebral palsy, weakness, paralysis, CVA, TIA Endocrine: diabetes, hypothyroidism, hyperthyroidism,  goiter, dry mouth, flushing, heat intolerance, cold intolerance,  excessive thirst, denies polyuria,  nocturia Hematological:  easy bleeding, excessive bleeding, easy bruising, enlarged lymph nodes, on long term blood thinner, history of past transusions Allergy/immunological:  hives, eczema, frequent infections, multiple drug allergies, seasonal allergies, transplant recipient, multiple food allergies Psychiatric:  anxiety, depression, mood disorder, suicidal ideations, hallucinations, insomnia  Objective: Vascular Examination: Capillary refill time <3 seconds  x 10 digits.  Dorsalis pedis pulses palpable b/l   Posterior tibial pulses palpable b/l   No digital hair x 10 digits.  Skin temperature gradient WNL b/l  Dermatological Examination: Skin with normal turgor, texture and tone b/l.  Toenails 1-5 b/l discolored, thick, dystrophic with subungual debris and pain with palpation to nailbeds due to thickness of nails.  Musculoskeletal: Muscle strength 5/5 to all LE muscle groups  Contracted digits 2-5 b/l  Neurological: Sensation intact with 10 gram monofilament.  Vibratory sensation intact.  Assessment: 1. Painful onychomycosis toenails 1-5 b/l  2. NIDDM 3. Patient on long term blood thinner.  Plan: 1. Discussed onychomycosis and treatment options.  Literature dispensed on today. 2. Toenails 1-5 b/l were debrided in length and girth without iatrogenic bleeding. 3. Patient to continue soft, supportive shoe  gear daily. 4. Avoid self trimming due to use of blood thinner. 5. Patient to report any pedal injuries to medical professional immediately. 6. Follow up 3 months.  7. Patient/POA to call should there be a concern in the interim.

## 2018-09-20 ENCOUNTER — Ambulatory Visit (INDEPENDENT_AMBULATORY_CARE_PROVIDER_SITE_OTHER): Payer: Medicare Other | Admitting: Nurse Practitioner

## 2018-09-20 ENCOUNTER — Encounter: Payer: Self-pay | Admitting: General Surgery

## 2018-09-20 ENCOUNTER — Ambulatory Visit: Payer: Medicare Other | Admitting: Pulmonary Disease

## 2018-09-20 ENCOUNTER — Other Ambulatory Visit: Payer: Self-pay

## 2018-09-20 ENCOUNTER — Telehealth: Payer: Self-pay | Admitting: Nurse Practitioner

## 2018-09-20 DIAGNOSIS — J45991 Cough variant asthma: Secondary | ICD-10-CM

## 2018-09-20 MED ORDER — CHLORPHENIRAMINE MALEATE 4 MG PO TABS: 4.0000 mg | ORAL_TABLET | Freq: Two times a day (BID) | ORAL | 0 refills | Status: DC | PRN

## 2018-09-20 MED ORDER — IPRATROPIUM BROMIDE 0.02 % IN SOLN: 0.5000 mg | Freq: Three times a day (TID) | RESPIRATORY_TRACT | 3 refills | Status: DC

## 2018-09-20 MED ORDER — LORATADINE 10 MG PO TABS: 10.0000 mg | ORAL_TABLET | Freq: Every day | ORAL | 11 refills | Status: DC

## 2018-09-20 MED ORDER — SODIUM CHLORIDE 3 % IN NEBU: INHALATION_SOLUTION | Freq: Three times a day (TID) | RESPIRATORY_TRACT | 3 refills | Status: DC

## 2018-09-20 NOTE — Telephone Encounter (Signed)
Called and spoke with pt explaining to him why we were wanting to change his OV from OV to televisit. Pt expressed understanding and he stated he would go through with the televisit. Stated to pt that TN will call him around scheduled time of 11am to do the televisit with him. Pt expressed understanding. Nothing further needed.

## 2018-09-20 NOTE — Patient Instructions (Addendum)
Continue Ipratropium nebulizer --> HS 3% saline --> acapella BID to TID to help with mucous clearance - Stressed importance of adherence to dysphagia diet - Encouraged regular exercise to help promote airway clearance  Follow up with Dr. Ander Slade in 4 months or sooner if needed    Aspiration Precautions, Adult Aspiration is the breathing in (inhalation) of a liquid or object into the lungs. Things that can be inhaled into the lungs include:  Food.  Any type of liquid, such as drinks or saliva.  Stomach contents, such as vomit or stomach acid. What are the signs of aspiration? Signs of aspiration include:  Coughing after swallowing food or liquids.  Clearing the throat often while eating.  Trouble breathing. This may include: ? Breathing quickly. ? Breathing very slowly. ? Loud breathing. ? Rumbling sounds from the lungs while breathing.  Coughing up phlegm (sputum) that: ? Is yellow, tan, or green. ? Has pieces of food in it. ? Is bad-smelling.  Having a hoarse, barky cough.  Not being able to speak.  A hoarse voice.  Drooling while eating.  A feeling of fullness in the throat or a feeling that something is stuck in the throat.  Choking often.  Having a runny noise while eating.  Coughing when lying down or having to sit up quickly after lying down.  A change in skin color. The skin may look red or blue.  Fever.  Watery eyes.  Pain in the chest or back.  A pained look on the face. What are the complications of aspiration? Complications of aspiration include:  Losing weight because the person is not absorbing needed nutrients.  Loss of enjoyment and the social benefits of eating.  Choking.  Lung irritation, if someone aspirates acidic food or drinks.  Lung infection (pneumonia).  Collection of infected liquid (pus) in the lungs (lung abscess). In serious cases, death can occur. What can I do to prevent aspiration? Caring for someone who has a  feeding tube If you are caring for someone who has a feeding tube who cannot eat or drink safely through his or her mouth:  Keep the person in an upright position as much as possible.  Do not lay the person flat if he or she is getting continuous feedings. If you need to lay the person flat for any reason, turn the feeding pump off.  Check feeding tube residuals as told by your health care provider. Ask your health care provider what residual amount is too high. Caring for someone who can eat and drink safely by mouth If you are caring for someone who can eat and drink safely through his or her mouth:  Have the person sit in an upright position when eating food or drinking fluids. This can be done in two ways: ? Have the person sit up in a chair. ? If sitting in a chair is not possible, position the person in bed so he or she is upright.  Remind the person to eat slowly and chew well. Make sure the person is awake and alert while eating.  Do not distract the person. This is especially important for people with thinking or memory (cognitive) problems.  Allow foods to cool. Hot foods may be more difficult to swallow.  Provide small meals more frequently, instead of 3 large meals. This may reduce fatigue during eating.  Check the person's mouth thoroughly for leftover food after eating.  Keep the person sitting upright for 30-45 minutes after eating.  Do  not serve food or drink during 2 hours or more before bedtime. General instructions Follow these general guidelines to prevent aspiration in someone who can eat and drink safely by mouth:  Never put food or liquids in the mouth of a person who is not fully alert.  Feed small amounts of food. Do not force feed.  For a person who is on a diet for swallowing difficulty (dysphagia diet), follow the recommended food and drink consistency. For example, in dysphagia diet level 1, thicken liquids to pudding-like consistency.  Use as little  water as possible when brushing the person's teeth or cleaning his or her mouth.  Provide oral care before and after meals.  Use adaptive devices such as cut-out cups, straws, or utensils as told by the health care provider.  Crush pills and put them in soft food such as pudding or ice cream. Some pills should not be crushed. Check with the health care provider before crushing any medicine. Contact a health care provider if:  The person has a feeding tube, and the feeding tube residual amount is too high.  The person has a fever.  The person tries to avoid food or water, such as refusing to eat, drink, or be fed, or is eating less than normal.  The person may have aspirated food or liquid.  You notice warning signs, such as choking or coughing, when the person eats or drinks. Get help right away if:  The person has trouble breathing or starts to breathe quickly.  The person is breathing very slowly or stops breathing.  The person coughs a lot after eating or drinking.  The person has a long-lasting (chronic) cough.  The person coughs up thick, yellow, or tan sputum.  If someone is choking on food or an object, perform the Heimlich maneuver (abdominal thrusts).  The person has symptoms of pneumonia, such as: ? Coughing a lot. ? Coughing up mucus with a bad smell or blood in it. ? Feeling short of breath. ? Complaining of chest pain. ? Sweating, fever, and chills. ? Feeling tired. ? Complaining of trouble breathing. ? Wheezing.  The person cannot stop choking.  The person is unable to breathe, turns blue, faints, or seems confused. These symptoms may represent a serious problem that is an emergency. Do not wait to see if the symptoms will go away. Get medical help right away. Call your local emergency services (911 in the [place]  Summary  Aspiration is the breathing in (inhalation) of a liquid or object into the lungs. Things that can be inhaled into the lungs include  food, liquids, saliva, or stomach contents.  Aspiration can cause pneumonia or choking.  One sign of aspiration is coughing after swallowing food or liquids.  Contact a health care provider if you notice signs of aspiration. This information is not intended to replace advice given to you by your health care provider. Make sure you discuss any questions you have with your health care provider. Document Released: 07/11/2010 Document Revised: 03/05/2016 Document Reviewed: 03/05/2016 Elsevier Interactive Patient Education  [employer].

## 2018-09-20 NOTE — Progress Notes (Signed)
Virtual Visit via Telephone Note  I connected with Oscar Baker on 09/20/18 at 11:00 AM EDT by telephone and verified that I am speaking with the correct person using two identifiers.   I discussed the limitations, risks, security and privacy concerns of performing an evaluation and management service by telephone and the availability of in person appointments. I also discussed with the patient that there may be a patient responsible charge related to this service. The patient expressed understanding and agreed to proceed.   History of Present Illness: 85 yo male former smoker (mainly pipe smoker )  followed for chronic cough  and sinus disease  last seen in office 2016 . Seen by PCCM during hospitalization 11/2017 with Bacteremia and Empyema . Patient is followed by Dr. Ander Slade.  PMH significant for CHF /CM , A Fib , Pacemaker ,CLL  Patient has a tele-visit today for chronic cough.  He was last seen by Dr. Ander Slade on 06/21/2018. Dr. Ander Slade discussed with patient that his cough was due to recurrent aspiration and referred patient to [facility] pulmonary for a second opinion.  Patient was seen by [facility] pulmonology on 08/15/2018. Dr. Lawerance Cruel once again discussed aspiration precautions with patient and discussed pulmonary hygiene.  He was ordered Atrovent and hypertonic saline nebulizers as well as flutter valve device to use 3 times a day.  Patient states that he has been compliant with nebulizers and flutter valve.  He does not follow his aspiration precaution diet.  He does not thicken his liquids.  He states that his cough has not improved at this point. Denies f/c/s, n/v/d, hemoptysis, PND, leg swelling.     Observations/Objective:  CXR 05/20/18 - Right lower lobe focal airspace opacities suspicious for pneumonia. Radiographic follow-up in 4-6 weeks following antibiotic trial recommended to ensure clearing.  CT Chest (11/23/2017):  1. Moderate to large left pleural effusion with complete collapse  of the left lower lobe. 2. Trace right pleural effusion with right lower lobe subsegmental atelectasis. 3. Mild mosaic attenuation throughout the lungs could reflect chronic small airways or small vessel disease. 4. Prominent bilateral axillary, left supraclavicular, and mediastinal lymph nodes measuring up to 10 mm in short axis are overall similar to prior study and likely reflect patient's history of chronic lymphocytic leukemia. 5. Aortic atherosclerosis (ICD10-I70.0).  Speech Swallow [facility] IP CLINICAL IMPRESSIONS 11/23/2017  Clinical Impression Pt has a mild oropharyngeal dysphagia with structural component due to suspected osteophytes most prominent at C3-4, C4-5 that impede full epiglottic deflection and airway closure. What further impacts his ability to protect his airway is his impulsive intake (large boluses and fast rate) and premature spillage. Thin and nectar thick liquids spill into the pharynx, filling the valleculae and spilling onward to the pyriform sinuses before the swallow. Then, as he swallows, the thin liquids spills into the partially open airway, allowing for trace amounts of penetration and aspiration. Pt had significant baseline coughing that made it very challenging to determine whether coughing was related to aspiration or not.   Although pt's dysphagia is mild, considering his impulsivity as well as his recurrent PNA and chronic cough, would favor starting a more conservative diet at this time: Dys 3 diet and nectar thick liquids. SLP will f/u to facilitate hopeful transition back to thin liquids with improved ability to take small sips, contain liquids orally, and increase oral care/use of aspiration precautions    Assessment and Plan: Patient has a tele-visit today for chronic cough.  He was last seen by Dr.  Olalere on 06/21/2018. Dr. Ander Slade discussed with patient that his cough was due to recurrent aspiration and referred patient to [facility] pulmonary for a second  opinion.  Patient was seen by [facility] pulmonology on 08/15/2018. Dr. Lawerance Cruel once again discussed aspiration precautions with patient and discussed pulmonary hygiene.  He was ordered Atrovent and hypertonic saline nebulizers as well as flutter valve device to use 3 times a day.  Patient states that he has been compliant with nebulizers and flutter valve.  He does not follow his aspiration precaution diet.  He does not thicken his liquids.  He states that his cough has not improved at this point. Again, we discussed the importance of compliance with dysphagia diet. Also discussed the importance of dysphagia diet.  Patient Instructions   Continue Ipratropium nebulizer --> HS 3% saline --> acapella BID to TID to help with mucous clearance - Stressed importance of adherence to dysphagia diet - Encouraged regular exercise to help promote airway clearance    Aspiration Precautions, Adult Aspiration is the breathing in (inhalation) of a liquid or object into the lungs. Things that can be inhaled into the lungs include:  Food.  Any type of liquid, such as drinks or saliva.  Stomach contents, such as vomit or stomach acid. What are the signs of aspiration? Signs of aspiration include:  Coughing after swallowing food or liquids.  Clearing the throat often while eating.  Trouble breathing. This may include: ? Breathing quickly. ? Breathing very slowly. ? Loud breathing. ? Rumbling sounds from the lungs while breathing.  Coughing up phlegm (sputum) that: ? Is yellow, tan, or green. ? Has pieces of food in it. ? Is bad-smelling.  Having a hoarse, barky cough.  Not being able to speak.  A hoarse voice.  Drooling while eating.  A feeling of fullness in the throat or a feeling that something is stuck in the throat.  Choking often.  Having a runny noise while eating.  Coughing when lying down or having to sit up quickly after lying down.  A change in skin color. The skin may look red or  blue.  Fever.  Watery eyes.  Pain in the chest or back.  A pained look on the face. What are the complications of aspiration? Complications of aspiration include:  Losing weight because the person is not absorbing needed nutrients.  Loss of enjoyment and the social benefits of eating.  Choking.  Lung irritation, if someone aspirates acidic food or drinks.  Lung infection (pneumonia).  Collection of infected liquid (pus) in the lungs (lung abscess). In serious cases, death can occur. What can I do to prevent aspiration? Caring for someone who has a feeding tube If you are caring for someone who has a feeding tube who cannot eat or drink safely through his or her mouth:  Keep the person in an upright position as much as possible.  Do not lay the person flat if he or she is getting continuous feedings. If you need to lay the person flat for any reason, turn the feeding pump off.  Check feeding tube residuals as told by your health care provider. Ask your health care provider what residual amount is too high. Caring for someone who can eat and drink safely by mouth If you are caring for someone who can eat and drink safely through his or her mouth:  Have the person sit in an upright position when eating food or drinking fluids. This can be done in two ways: ?  Have the person sit up in a chair. ? If sitting in a chair is not possible, position the person in bed so he or she is upright.  Remind the person to eat slowly and chew well. Make sure the person is awake and alert while eating.  Do not distract the person. This is especially important for people with thinking or memory (cognitive) problems.  Allow foods to cool. Hot foods may be more difficult to swallow.  Provide small meals more frequently, instead of 3 large meals. This may reduce fatigue during eating.  Check the person's mouth thoroughly for leftover food after eating.  Keep the person sitting upright for  30-45 minutes after eating.  Do not serve food or drink during 2 hours or more before bedtime. General instructions Follow these general guidelines to prevent aspiration in someone who can eat and drink safely by mouth:  Never put food or liquids in the mouth of a person who is not fully alert.  Feed small amounts of food. Do not force feed.  For a person who is on a diet for swallowing difficulty (dysphagia diet), follow the recommended food and drink consistency. For example, in dysphagia diet level 1, thicken liquids to pudding-like consistency.  Use as little water as possible when brushing the person's teeth or cleaning his or her mouth.  Provide oral care before and after meals.  Use adaptive devices such as cut-out cups, straws, or utensils as told by the health care provider.  Crush pills and put them in soft food such as pudding or ice cream. Some pills should not be crushed. Check with the health care provider before crushing any medicine. Contact a health care provider if:  The person has a feeding tube, and the feeding tube residual amount is too high.  The person has a fever.  The person tries to avoid food or water, such as refusing to eat, drink, or be fed, or is eating less than normal.  The person may have aspirated food or liquid.  You notice warning signs, such as choking or coughing, when the person eats or drinks. Get help right away if:  The person has trouble breathing or starts to breathe quickly.  The person is breathing very slowly or stops breathing.  The person coughs a lot after eating or drinking.  The person has a long-lasting (chronic) cough.  The person coughs up thick, yellow, or tan sputum.  If someone is choking on food or an object, perform the Heimlich maneuver (abdominal thrusts).  The person has symptoms of pneumonia, such as: ? Coughing a lot. ? Coughing up mucus with a bad smell or blood in it. ? Feeling short of  breath. ? Complaining of chest pain. ? Sweating, fever, and chills. ? Feeling tired. ? Complaining of trouble breathing. ? Wheezing.  The person cannot stop choking.  The person is unable to breathe, turns blue, faints, or seems confused. These symptoms may represent a serious problem that is an emergency. Do not wait to see if the symptoms will go away. Get medical help right away. Call your local emergency services (911 in the [place]  Summary  Aspiration is the breathing in (inhalation) of a liquid or object into the lungs. Things that can be inhaled into the lungs include food, liquids, saliva, or stomach contents.  Aspiration can cause pneumonia or choking.  One sign of aspiration is coughing after swallowing food or liquids.  Contact a health care provider if you notice  signs of aspiration. This information is not intended to replace advice given to you by your health care provider. Make sure you discuss any questions you have with your health care provider. Document Released: 07/11/2010 Document Revised: 03/05/2016 Document Reviewed: 03/05/2016 Elsevier Interactive Patient Education  2019 [employer].     Follow Up Instructions: Follow up with Dr. Ander Slade in 4 months or sooner if needed     I discussed the assessment and treatment plan with the patient. The patient was provided an opportunity to ask questions and all were answered. The patient agreed with the plan and demonstrated an understanding of the instructions.   The patient was advised to call back or seek an in-person evaluation if the symptoms worsen or if the condition fails to improve as anticipated.  I provided 23 minutes of non-face-to-face time during this encounter.   Fenton Foy, NP

## 2018-09-21 ENCOUNTER — Encounter: Payer: Self-pay | Admitting: Nurse Practitioner

## 2018-09-21 ENCOUNTER — Telehealth: Payer: Self-pay

## 2018-09-21 NOTE — Telephone Encounter (Signed)

## 2018-09-21 NOTE — Assessment & Plan Note (Signed)
Patient has a tele-visit today for chronic cough.  He was last seen by Dr. Ander Slade on 06/21/2018. Dr. Ander Slade discussed with patient that his cough was due to recurrent aspiration and referred patient to [facility] pulmonary for a second opinion.  Patient was seen by [facility] pulmonology on 08/15/2018. Dr. Lawerance Cruel once again discussed aspiration precautions with patient and discussed pulmonary hygiene.  He was ordered Atrovent and hypertonic saline nebulizers as well as flutter valve device to use 3 times a day.  Patient states that he has been compliant with nebulizers and flutter valve.  He does not follow his aspiration precaution diet.  He does not thicken his liquids.  He states that his cough has not improved at this point. Again, we discussed the importance of compliance with dysphagia diet. Also discussed the importance of dysphagia diet.  Patient Instructions   Continue Ipratropium nebulizer --> HS 3% saline --> acapella BID to TID to help with mucous clearance - Stressed importance of adherence to dysphagia diet - Encouraged regular exercise to help promote airway clearance  Follow up with Dr. Ander Slade in 4 months or sooner if needed    Aspiration Precautions, Adult Aspiration is the breathing in (inhalation) of a liquid or object into the lungs. Things that can be inhaled into the lungs include:  Food.  Any type of liquid, such as drinks or saliva.  Stomach contents, such as vomit or stomach acid. What are the signs of aspiration? Signs of aspiration include:  Coughing after swallowing food or liquids.  Clearing the throat often while eating.  Trouble breathing. This may include: ? Breathing quickly. ? Breathing very slowly. ? Loud breathing. ? Rumbling sounds from the lungs while breathing.  Coughing up phlegm (sputum) that: ? Is yellow, tan, or green. ? Has pieces of food in it. ? Is bad-smelling.  Having a hoarse, barky cough.  Not being able to speak.  A hoarse  voice.  Drooling while eating.  A feeling of fullness in the throat or a feeling that something is stuck in the throat.  Choking often.  Having a runny noise while eating.  Coughing when lying down or having to sit up quickly after lying down.  A change in skin color. The skin may look red or blue.  Fever.  Watery eyes.  Pain in the chest or back.  A pained look on the face. What are the complications of aspiration? Complications of aspiration include:  Losing weight because the person is not absorbing needed nutrients.  Loss of enjoyment and the social benefits of eating.  Choking.  Lung irritation, if someone aspirates acidic food or drinks.  Lung infection (pneumonia).  Collection of infected liquid (pus) in the lungs (lung abscess). In serious cases, death can occur. What can I do to prevent aspiration? Caring for someone who has a feeding tube If you are caring for someone who has a feeding tube who cannot eat or drink safely through his or her mouth:  Keep the person in an upright position as much as possible.  Do not lay the person flat if he or she is getting continuous feedings. If you need to lay the person flat for any reason, turn the feeding pump off.  Check feeding tube residuals as told by your health care provider. Ask your health care provider what residual amount is too high. Caring for someone who can eat and drink safely by mouth If you are caring for someone who can eat and drink safely through  his or her mouth:  Have the person sit in an upright position when eating food or drinking fluids. This can be done in two ways: ? Have the person sit up in a chair. ? If sitting in a chair is not possible, position the person in bed so he or she is upright.  Remind the person to eat slowly and chew well. Make sure the person is awake and alert while eating.  Do not distract the person. This is especially important for people with thinking or memory  (cognitive) problems.  Allow foods to cool. Hot foods may be more difficult to swallow.  Provide small meals more frequently, instead of 3 large meals. This may reduce fatigue during eating.  Check the person's mouth thoroughly for leftover food after eating.  Keep the person sitting upright for 30-45 minutes after eating.  Do not serve food or drink during 2 hours or more before bedtime. General instructions Follow these general guidelines to prevent aspiration in someone who can eat and drink safely by mouth:  Never put food or liquids in the mouth of a person who is not fully alert.  Feed small amounts of food. Do not force feed.  For a person who is on a diet for swallowing difficulty (dysphagia diet), follow the recommended food and drink consistency. For example, in dysphagia diet level 1, thicken liquids to pudding-like consistency.  Use as little water as possible when brushing the person's teeth or cleaning his or her mouth.  Provide oral care before and after meals.  Use adaptive devices such as cut-out cups, straws, or utensils as told by the health care provider.  Crush pills and put them in soft food such as pudding or ice cream. Some pills should not be crushed. Check with the health care provider before crushing any medicine. Contact a health care provider if:  The person has a feeding tube, and the feeding tube residual amount is too high.  The person has a fever.  The person tries to avoid food or water, such as refusing to eat, drink, or be fed, or is eating less than normal.  The person may have aspirated food or liquid.  You notice warning signs, such as choking or coughing, when the person eats or drinks. Get help right away if:  The person has trouble breathing or starts to breathe quickly.  The person is breathing very slowly or stops breathing.  The person coughs a lot after eating or drinking.  The person has a long-lasting (chronic)  cough.  The person coughs up thick, yellow, or tan sputum.  If someone is choking on food or an object, perform the Heimlich maneuver (abdominal thrusts).  The person has symptoms of pneumonia, such as: ? Coughing a lot. ? Coughing up mucus with a bad smell or blood in it. ? Feeling short of breath. ? Complaining of chest pain. ? Sweating, fever, and chills. ? Feeling tired. ? Complaining of trouble breathing. ? Wheezing.  The person cannot stop choking.  The person is unable to breathe, turns blue, faints, or seems confused. These symptoms may represent a serious problem that is an emergency. Do not wait to see if the symptoms will go away. Get medical help right away. Call your local emergency services (911 in the [place]  Summary  Aspiration is the breathing in (inhalation) of a liquid or object into the lungs. Things that can be inhaled into the lungs include food, liquids, saliva, or stomach contents.  Aspiration can cause pneumonia or choking.  One sign of aspiration is coughing after swallowing food or liquids.  Contact a health care provider if you notice signs of aspiration. This information is not intended to replace advice given to you by your health care provider. Make sure you discuss any questions you have with your health care provider. Document Released: 07/11/2010 Document Revised: 03/05/2016 Document Reviewed: 03/05/2016 Elsevier Interactive Patient Education  [employer].

## 2018-09-22 ENCOUNTER — Ambulatory Visit (INDEPENDENT_AMBULATORY_CARE_PROVIDER_SITE_OTHER): Payer: Medicare Other | Admitting: Pharmacist

## 2018-09-22 ENCOUNTER — Other Ambulatory Visit: Payer: Self-pay

## 2018-09-22 DIAGNOSIS — Z5181 Encounter for therapeutic drug level monitoring: Secondary | ICD-10-CM | POA: Diagnosis not present

## 2018-09-22 DIAGNOSIS — I4821 Permanent atrial fibrillation: Secondary | ICD-10-CM

## 2018-09-22 DIAGNOSIS — I480 Paroxysmal atrial fibrillation: Secondary | ICD-10-CM

## 2018-09-22 LAB — POCT INR: INR: 5.4 — AB (ref 2.0–3.0)

## 2018-10-04 ENCOUNTER — Other Ambulatory Visit: Payer: Self-pay | Admitting: General Surgery

## 2018-10-04 DIAGNOSIS — J45991 Cough variant asthma: Secondary | ICD-10-CM

## 2018-10-04 MED ORDER — IPRATROPIUM BROMIDE 0.02 % IN SOLN: 0.5000 mg | Freq: Three times a day (TID) | RESPIRATORY_TRACT | 3 refills | Status: DC

## 2018-10-05 ENCOUNTER — Telehealth: Payer: Self-pay

## 2018-10-05 NOTE — Telephone Encounter (Signed)

## 2018-10-06 ENCOUNTER — Ambulatory Visit (INDEPENDENT_AMBULATORY_CARE_PROVIDER_SITE_OTHER): Payer: Medicare Other | Admitting: Pharmacist

## 2018-10-06 ENCOUNTER — Other Ambulatory Visit: Payer: Self-pay

## 2018-10-06 DIAGNOSIS — I480 Paroxysmal atrial fibrillation: Secondary | ICD-10-CM | POA: Diagnosis not present

## 2018-10-06 DIAGNOSIS — I4821 Permanent atrial fibrillation: Secondary | ICD-10-CM

## 2018-10-06 DIAGNOSIS — Z5181 Encounter for therapeutic drug level monitoring: Secondary | ICD-10-CM | POA: Diagnosis not present

## 2018-10-06 LAB — POCT INR: INR: 2.3 (ref 2.0–3.0)

## 2018-10-10 ENCOUNTER — Ambulatory Visit (INDEPENDENT_AMBULATORY_CARE_PROVIDER_SITE_OTHER): Payer: Medicare Other | Admitting: *Deleted

## 2018-10-10 ENCOUNTER — Other Ambulatory Visit: Payer: Self-pay

## 2018-10-10 DIAGNOSIS — I495 Sick sinus syndrome: Secondary | ICD-10-CM | POA: Diagnosis not present

## 2018-10-11 ENCOUNTER — Telehealth: Payer: Self-pay

## 2018-10-12 LAB — CUP PACEART REMOTE DEVICE CHECK
Date Time Interrogation Session: 20200422094033
Implantable Lead Implant Date: 20150820
Implantable Lead Location: 753860
Implantable Lead Model: 350
Implantable Lead Serial Number: 29625633
Implantable Pulse Generator Implant Date: 20150820
Pulse Gen Model: 394934
Pulse Gen Serial Number: 68372291

## 2018-10-13 ENCOUNTER — Other Ambulatory Visit: Payer: Self-pay | Admitting: *Deleted

## 2018-10-13 NOTE — Patient Outreach (Signed)
Outreach call for screening, insurance referral, spoke with pt, HIPAA verified, RN CM explained reason for call and [facility] program and pt states " I'm fine at home and I don't need to talk with anyone" and pt hung up phone.  RN CM mailed successful outreach letter to patient's home with 24 hour nurse line magnet.  Jacqlyn Larsen [facility], [place] Coordinator [phone]

## 2018-10-19 ENCOUNTER — Encounter: Payer: Self-pay | Admitting: Cardiology

## 2018-10-19 NOTE — Progress Notes (Signed)
Remote pacemaker transmission.   

## 2018-10-31 ENCOUNTER — Telehealth: Payer: Self-pay

## 2018-10-31 NOTE — Telephone Encounter (Signed)

## 2018-11-01 ENCOUNTER — Ambulatory Visit (INDEPENDENT_AMBULATORY_CARE_PROVIDER_SITE_OTHER): Payer: Medicare Other | Admitting: Pharmacist

## 2018-11-01 ENCOUNTER — Other Ambulatory Visit: Payer: Self-pay

## 2018-11-01 DIAGNOSIS — I480 Paroxysmal atrial fibrillation: Secondary | ICD-10-CM

## 2018-11-01 DIAGNOSIS — I4821 Permanent atrial fibrillation: Secondary | ICD-10-CM

## 2018-11-01 DIAGNOSIS — Z5181 Encounter for therapeutic drug level monitoring: Secondary | ICD-10-CM

## 2018-11-01 LAB — POCT INR: INR: 2.9 (ref 2.0–3.0)

## 2018-11-10 DIAGNOSIS — C61 Malignant neoplasm of prostate: Secondary | ICD-10-CM | POA: Diagnosis not present

## 2018-11-15 ENCOUNTER — Other Ambulatory Visit: Payer: Self-pay

## 2018-11-15 ENCOUNTER — Inpatient Hospital Stay (HOSPITAL_BASED_OUTPATIENT_CLINIC_OR_DEPARTMENT_OTHER): Payer: Medicare Other | Admitting: Oncology

## 2018-11-15 ENCOUNTER — Inpatient Hospital Stay: Payer: Medicare Other | Attending: Oncology

## 2018-11-15 VITALS — BP 134/66 | HR 85 | Temp 97.3°F | Resp 18 | Ht 70.0 in | Wt 203.2 lb

## 2018-11-15 DIAGNOSIS — Z7901 Long term (current) use of anticoagulants: Secondary | ICD-10-CM | POA: Insufficient documentation

## 2018-11-15 DIAGNOSIS — Z79899 Other long term (current) drug therapy: Secondary | ICD-10-CM | POA: Diagnosis not present

## 2018-11-15 DIAGNOSIS — Z7984 Long term (current) use of oral hypoglycemic drugs: Secondary | ICD-10-CM | POA: Diagnosis not present

## 2018-11-15 DIAGNOSIS — R32 Unspecified urinary incontinence: Secondary | ICD-10-CM | POA: Diagnosis not present

## 2018-11-15 DIAGNOSIS — C9110 Chronic lymphocytic leukemia of B-cell type not having achieved remission: Secondary | ICD-10-CM | POA: Diagnosis not present

## 2018-11-15 DIAGNOSIS — C61 Malignant neoplasm of prostate: Secondary | ICD-10-CM | POA: Diagnosis not present

## 2018-11-15 LAB — CBC WITH DIFFERENTIAL (CANCER CENTER ONLY)
Abs Immature Granulocytes: 0 10*3/uL (ref 0.00–0.07)
Band Neutrophils: 3 %
Basophils Absolute: 0 10*3/uL (ref 0.0–0.1)
Basophils Relative: 0 %
Eosinophils Absolute: 0 10*3/uL (ref 0.0–0.5)
Eosinophils Relative: 0 %
HCT: 39.5 % (ref 39.0–52.0)
Hemoglobin: 12.3 g/dL — ABNORMAL LOW (ref 13.0–17.0)
Lymphocytes Relative: 73 %
Lymphs Abs: 15.4 10*3/uL — ABNORMAL HIGH (ref 0.7–4.0)
MCH: 31.3 pg (ref 26.0–34.0)
MCHC: 31.1 g/dL (ref 30.0–36.0)
MCV: 100.5 fL — ABNORMAL HIGH (ref 80.0–100.0)
Monocytes Absolute: 0.4 10*3/uL (ref 0.1–1.0)
Monocytes Relative: 2 %
Neutro Abs: 5.3 10*3/uL (ref 1.7–17.7)
Neutrophils Relative %: 22 %
Platelet Count: 283 10*3/uL (ref 150–400)
RBC: 3.93 MIL/uL — ABNORMAL LOW (ref 4.22–5.81)
RDW: 20.2 % — ABNORMAL HIGH (ref 11.5–15.5)
WBC Count: 21.1 10*3/uL — ABNORMAL HIGH (ref 4.0–10.5)
nRBC: 0 % (ref 0.0–0.2)

## 2018-11-15 LAB — CMP (CANCER CENTER ONLY)
ALT: 19 U/L (ref 0–44)
AST: 30 U/L (ref 15–41)
Albumin: 3.7 g/dL (ref 3.5–5.0)
Alkaline Phosphatase: 66 U/L (ref 38–126)
Anion gap: 10 (ref 5–15)
BUN: 17 mg/dL (ref 8–23)
CO2: 24 mmol/L (ref 22–32)
Calcium: 9 mg/dL (ref 8.9–10.3)
Chloride: 106 mmol/L (ref 98–111)
Creatinine: 1.02 mg/dL (ref 0.61–1.24)
GFR, Est AFR Am: >60 mL/min (ref >60)
GFR, Estimated: >60 mL/min (ref >60)
Glucose, Bld: 150 mg/dL — ABNORMAL HIGH (ref 70–99)
Potassium: 4.5 mmol/L (ref 3.5–5.1)
Sodium: 140 mmol/L (ref 135–145)
Total Bilirubin: 0.6 mg/dL (ref 0.3–1.2)
Total Protein: 6.7 g/dL (ref 6.5–8.1)

## 2018-11-15 NOTE — Progress Notes (Signed)
Hematology and Oncology Follow Up Visit  Oscar Baker [id number] [birth year] 84 y.o. 11/15/2018 9:57 AM Oscar Baker, [place], Oscar Reichmann, MD   Principle Diagnosis: 85 year old man with:  1.  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  He presented with leukocytosis and no lymphadenopathy.   2.  Prostate cancer diagnosed in 2004 after presenting with Gleason score 4+4 = 8 and underwent a prostatectomy in 2004.  He developed biochemical relapse in 2018 with a PSA of 3.   Current therapy:   Active surveillance in regards to CLL.    He is currently on Lupron every 4 months under the care of Dr. Diona Baker for prostate cancer.  Interim History:  Oscar Baker returns today for a repeat evaluation.  Since the last visit, he reports no major changes in his health.  He denies any recent hospitalizations or illnesses.  He denies any lymphadenopathy or constitutional symptoms.  He has a condom catheter which should help with his incontinence at this time.  He denies any recent fevers or chills.  Patient denied any alteration mental status, neuropathy, confusion or dizziness.  Denies any headaches or lethargy.  Denies any night sweats, weight loss or changes in appetite.  Denied orthopnea, dyspnea on exertion or chest discomfort.  Denies shortness of breath, difficulty breathing hemoptysis or cough.  Denies any abdominal distention, nausea, early satiety or dyspepsia.  Denies any hematuria, frequency, dysuria or nocturia.  Denies any skin irritation, dryness or rash.  Denies any ecchymosis or petechiae.  Denies any lymphadenopathy or clotting.  Denies any heat or cold intolerance.  Denies any anxiety or depression.  Remaining review of system is negative.    Medications: I have reviewed the patient's current medications.  Current Outpatient Medications  Medication Sig Dispense Refill  . acetaminophen (TYLENOL) 500 MG tablet Take 2 tablets (1,000 mg total) by mouth every 6 (six) hours. 30 tablet 0  . atorvastatin  (LIPITOR) 20 MG tablet TAKE ONE TABLET EACH DAY AT 6PM 90 tablet 3  . carvedilol (COREG) 3.125 MG tablet TAKE ONE TABLET TWICE DAILY 180 tablet 3  . chlorpheniramine (CHLOR-TRIMETON) 4 MG tablet Take 1 tablet (4 mg total) by mouth 2 (two) times daily as needed for up to 7 days for allergies. 14 tablet 0  . fenofibrate micronized (LOFIBRA) 200 MG capsule TAKE ONE CAPSULE EACH DAY BEFORE BREAKFAST 90 capsule 2  . furosemide (LASIX) 20 MG tablet TAKE ONE TABLET DAILY AS NEEDED 90 tablet 3  . glipiZIDE (GLUCOTROL XL) 2.5 MG 24 hr tablet Take 2.5 mg by mouth daily with breakfast.    . guaiFENesin (MUCINEX) 600 MG 12 hr tablet Take 600 mg by mouth as needed.     [name] HYDROcodone-homatropine (HYCODAN) 5-1.5 MG/5ML syrup Take 5 mLs by mouth every 6 (six) hours as needed for cough. 240 mL 0  . ipratropium (ATROVENT) 0.02 % nebulizer solution Inhale into the lungs.    [name] ipratropium (ATROVENT) 0.02 % nebulizer solution Take 2.5 mLs (0.5 mg total) by nebulization 3 (three) times daily. 225 mL 3  . metFORMIN (GLUCOPHAGE) 1000 MG tablet Take 1,000 mg by mouth 2 (two) times daily with a meal.      . mometasone (ELOCON) 0.1 % lotion     . multivitamin (THERAGRAN) per tablet Take 1 tablet by mouth daily. Will stop prior to procedure    . mupirocin ointment (BACTROBAN) 2 %     . Nebulizers (VIOS LC SPRINT DELUXE) MISC Use 1 each every 6 (six) hours as  needed (shortness of breath or wheezing) Fax to [facility]    . sitaGLIPtin (JANUVIA) 100 MG tablet Take 100 mg by mouth daily.    . sodium chloride HYPERTONIC 3 % nebulizer solution Inhale into the lungs.    . sodium chloride HYPERTONIC 3 % nebulizer solution Take by nebulization 3 (three) times daily. 360 mL 3  . vitamin B-12 (CYANOCOBALAMIN) 1000 MCG tablet Take 1,000 mcg by mouth daily. Will stop prior to procedure    . warfarin (COUMADIN) 5 MG tablet TAKE AS DIRECTED BY COUMADIN CLINIC 120 tablet 0   No current facility-administered medications for this visit.       Allergies:  Allergies  Allergen Reactions  . Altace [Ramipril] Other (See Comments)    "throat felt like had a knot in it"  . Augmentin [Amoxicillin-Pot Clavulanate] Diarrhea    severe  . Codeine Nausea And Vomiting    Nausea and vomiting   . Simvastatin Other (See Comments)    Leg aches    Past Medical History, Surgical history, Social history, and Family History reviewed and updated today.   Physical Exam: Blood pressure 134/66, pulse 85, temperature (!) 97.3 F (36.3 C), temperature source Oral, resp. rate 18, height 5\' 10"  (1.778 m), weight 203 lb 3.2 oz (92.2 kg), SpO2 98 %.    ECOG: 1    General appearance: Alert, awake without any distress. Head: Atraumatic without abnormalities Oropharynx: Without any thrush or ulcers. Eyes: No scleral icterus. Lymph nodes: No lymphadenopathy noted in the cervical, supraclavicular, or axillary nodes Heart:regular rate and rhythm, without any murmurs or gallops.   Lung: Clear to auscultation without any rhonchi, wheezes or dullness to percussion. Abdomin: Soft, nontender without any shifting dullness or ascites. Musculoskeletal: No clubbing or cyanosis. Neurological: No motor or sensory deficits. Skin: No rashes or lesions.    Lab Results: Lab Results  Component Value Date   WBC 17.4 (H) 05/24/2018   HGB 9.3 (L) 05/24/2018   HCT 27.9 (L) 05/24/2018   MCV 96.5 05/24/2018   PLT 509 (H) 05/24/2018     Chemistry      Component Value Date/Time   NA 134 (L) 05/23/2018 0239   NA 140 01/26/2018 1244   NA 139 05/11/2017 0831   K 4.6 05/23/2018 0239   K 4.6 05/11/2017 0831   CL 99 05/23/2018 0239   CO2 29 05/23/2018 0239   CO2 23 05/11/2017 0831   BUN 30 (H) 05/23/2018 0239   BUN 21 01/26/2018 1244   BUN 21.1 05/11/2017 0831   CREATININE 0.84 05/23/2018 0239   CREATININE 1.11 05/17/2018 0805   CREATININE 1.0 05/11/2017 0831      Component Value Date/Time   CALCIUM 8.4 (L) 05/23/2018 0239   CALCIUM 9.0  05/11/2017 0831   ALKPHOS 71 05/17/2018 0805   ALKPHOS 65 05/11/2017 0831   AST 18 05/17/2018 0805   AST 26 05/11/2017 0831   ALT 11 05/17/2018 0805   ALT 19 05/11/2017 0831   BILITOT 0.9 05/17/2018 0805   BILITOT 0.75 05/11/2017 0831      Impression and Plan:  85 year old gentleman with:  1.  Stage 0 CLL diagnosed in 2015 without any indication for treatment since that time.  He remains on active surveillance.  Laboratory and data from today were reviewed and discussed with the patient and continues to show rather stable white cell count with very mild lymphocytosis.  Risks and benefits of continuing active surveillance versus indication for treatment were reviewed today.  Indication for  treatment would include rapid rise in his white cell count, painful adenopathy, bone marrow failure and cytopenias.  At this time he is not experiencing any of these complaints and have recommended active surveillance.  He is agreeable to continue.  2.  Prostate cancer: He is currently receiving Lupron under the care of Dr. Diona Baker.  3.  Urinary incontinence: Managed better with a condom catheter under the care of Dr. Diona Baker.  4. Follow-up: He will return in 6 months for repeat evaluation.  15  minutes was spent with the patient face-to-face today.  More than 50% of time was spent on reviewing his disease status, laboratory data review and answering questions regarding future plan of care.    Zola Button, MD 5/26/20209:57 AM

## 2018-11-17 ENCOUNTER — Telehealth: Payer: Self-pay | Admitting: Oncology

## 2018-11-17 DIAGNOSIS — E669 Obesity, unspecified: Secondary | ICD-10-CM | POA: Diagnosis not present

## 2018-11-17 DIAGNOSIS — R05 Cough: Secondary | ICD-10-CM | POA: Diagnosis not present

## 2018-11-17 DIAGNOSIS — Z7901 Long term (current) use of anticoagulants: Secondary | ICD-10-CM | POA: Diagnosis not present

## 2018-11-17 DIAGNOSIS — I4892 Unspecified atrial flutter: Secondary | ICD-10-CM | POA: Diagnosis not present

## 2018-11-17 DIAGNOSIS — C61 Malignant neoplasm of prostate: Secondary | ICD-10-CM | POA: Diagnosis not present

## 2018-11-17 DIAGNOSIS — N183 Chronic kidney disease, stage 3 (moderate): Secondary | ICD-10-CM | POA: Diagnosis not present

## 2018-11-17 DIAGNOSIS — Z950 Presence of cardiac pacemaker: Secondary | ICD-10-CM | POA: Diagnosis not present

## 2018-11-17 DIAGNOSIS — E1122 Type 2 diabetes mellitus with diabetic chronic kidney disease: Secondary | ICD-10-CM | POA: Diagnosis not present

## 2018-11-17 DIAGNOSIS — K219 Gastro-esophageal reflux disease without esophagitis: Secondary | ICD-10-CM | POA: Diagnosis not present

## 2018-11-17 DIAGNOSIS — C9100 Acute lymphoblastic leukemia not having achieved remission: Secondary | ICD-10-CM | POA: Diagnosis not present

## 2018-11-17 DIAGNOSIS — I5022 Chronic systolic (congestive) heart failure: Secondary | ICD-10-CM | POA: Diagnosis not present

## 2018-11-17 DIAGNOSIS — I130 Hypertensive heart and chronic kidney disease with heart failure and stage 1 through stage 4 chronic kidney disease, or unspecified chronic kidney disease: Secondary | ICD-10-CM | POA: Diagnosis not present

## 2018-11-17 NOTE — Telephone Encounter (Signed)
Called regarding schedule °

## 2018-11-18 DIAGNOSIS — N3945 Continuous leakage: Secondary | ICD-10-CM | POA: Diagnosis not present

## 2018-11-18 DIAGNOSIS — C61 Malignant neoplasm of prostate: Secondary | ICD-10-CM | POA: Diagnosis not present

## 2018-11-18 DIAGNOSIS — R9721 Rising PSA following treatment for malignant neoplasm of prostate: Secondary | ICD-10-CM | POA: Diagnosis not present

## 2018-11-21 ENCOUNTER — Other Ambulatory Visit: Payer: Self-pay | Admitting: Urology

## 2018-11-21 DIAGNOSIS — Z79899 Other long term (current) drug therapy: Secondary | ICD-10-CM

## 2018-11-30 ENCOUNTER — Telehealth: Payer: Self-pay | Admitting: Student

## 2018-11-30 NOTE — Telephone Encounter (Signed)
[employer] alert that no message received from home monitor since 10/20/2018.  Pt states it has accidentally come unplugged. It is now plugged in and reading "OK" on the screen.

## 2018-12-06 ENCOUNTER — Telehealth: Payer: Self-pay

## 2018-12-06 NOTE — Telephone Encounter (Signed)
lmom for prescreen  

## 2018-12-07 ENCOUNTER — Telehealth: Payer: Self-pay | Admitting: *Deleted

## 2018-12-07 NOTE — Telephone Encounter (Signed)

## 2018-12-08 ENCOUNTER — Ambulatory Visit: Payer: Medicare Other | Admitting: Podiatry

## 2018-12-13 ENCOUNTER — Other Ambulatory Visit: Payer: Self-pay

## 2018-12-13 ENCOUNTER — Ambulatory Visit (INDEPENDENT_AMBULATORY_CARE_PROVIDER_SITE_OTHER): Payer: Medicare Other

## 2018-12-13 DIAGNOSIS — I4821 Permanent atrial fibrillation: Secondary | ICD-10-CM

## 2018-12-13 DIAGNOSIS — I480 Paroxysmal atrial fibrillation: Secondary | ICD-10-CM

## 2018-12-13 DIAGNOSIS — Z5181 Encounter for therapeutic drug level monitoring: Secondary | ICD-10-CM

## 2018-12-13 LAB — POCT INR: INR: 1.4 — AB (ref 2.0–3.0)

## 2018-12-13 NOTE — Patient Instructions (Signed)
Description   Take 1.5 tablets today and tomorrow, then start taking 1 tablet daily except 1.5 tablets on Tuesdays and Saturdays. Recheck in 2 weeks.  Call [facility] [phone] with any concerns.

## 2018-12-29 ENCOUNTER — Telehealth: Payer: Self-pay

## 2018-12-29 NOTE — Telephone Encounter (Signed)

## 2019-01-02 ENCOUNTER — Ambulatory Visit (INDEPENDENT_AMBULATORY_CARE_PROVIDER_SITE_OTHER): Payer: Medicare Other

## 2019-01-02 ENCOUNTER — Emergency Department (HOSPITAL_BASED_OUTPATIENT_CLINIC_OR_DEPARTMENT_OTHER)
Admission: EM | Admit: 2019-01-02 | Discharge: 2019-01-02 | Disposition: A | Discharge status: Home / Self Care | Payer: Medicare Other | Attending: Emergency Medicine | Admitting: Emergency Medicine

## 2019-01-02 ENCOUNTER — Ambulatory Visit (INDEPENDENT_AMBULATORY_CARE_PROVIDER_SITE_OTHER): Payer: Medicare Other | Admitting: Pulmonary Disease

## 2019-01-02 ENCOUNTER — Emergency Department (HOSPITAL_BASED_OUTPATIENT_CLINIC_OR_DEPARTMENT_OTHER): Payer: Medicare Other

## 2019-01-02 ENCOUNTER — Encounter (HOSPITAL_BASED_OUTPATIENT_CLINIC_OR_DEPARTMENT_OTHER): Payer: Self-pay

## 2019-01-02 ENCOUNTER — Encounter: Payer: Self-pay | Admitting: Pulmonary Disease

## 2019-01-02 ENCOUNTER — Other Ambulatory Visit: Payer: Self-pay

## 2019-01-02 VITALS — BP 122/62 | HR 83 | Temp 98.5°F | Ht 69.5 in | Wt 203.0 lb

## 2019-01-02 DIAGNOSIS — Y929 Unspecified place or not applicable: Secondary | ICD-10-CM | POA: Insufficient documentation

## 2019-01-02 DIAGNOSIS — Z7984 Long term (current) use of oral hypoglycemic drugs: Secondary | ICD-10-CM | POA: Diagnosis not present

## 2019-01-02 DIAGNOSIS — S01112A Laceration without foreign body of left eyelid and periocular area, initial encounter: Secondary | ICD-10-CM | POA: Diagnosis not present

## 2019-01-02 DIAGNOSIS — E119 Type 2 diabetes mellitus without complications: Secondary | ICD-10-CM | POA: Diagnosis not present

## 2019-01-02 DIAGNOSIS — Y999 Unspecified external cause status: Secondary | ICD-10-CM | POA: Insufficient documentation

## 2019-01-02 DIAGNOSIS — S0510XA Contusion of eyeball and orbital tissues, unspecified eye, initial encounter: Secondary | ICD-10-CM | POA: Diagnosis not present

## 2019-01-02 DIAGNOSIS — S51012A Laceration without foreign body of left elbow, initial encounter: Secondary | ICD-10-CM | POA: Diagnosis not present

## 2019-01-02 DIAGNOSIS — S0181XA Laceration without foreign body of other part of head, initial encounter: Secondary | ICD-10-CM | POA: Diagnosis not present

## 2019-01-02 DIAGNOSIS — Z96642 Presence of left artificial hip joint: Secondary | ICD-10-CM | POA: Diagnosis not present

## 2019-01-02 DIAGNOSIS — I2510 Atherosclerotic heart disease of native coronary artery without angina pectoris: Secondary | ICD-10-CM | POA: Diagnosis not present

## 2019-01-02 DIAGNOSIS — Y939 Activity, unspecified: Secondary | ICD-10-CM | POA: Insufficient documentation

## 2019-01-02 DIAGNOSIS — S80212A Abrasion, left knee, initial encounter: Secondary | ICD-10-CM | POA: Diagnosis not present

## 2019-01-02 DIAGNOSIS — Z79899 Other long term (current) drug therapy: Secondary | ICD-10-CM | POA: Diagnosis not present

## 2019-01-02 DIAGNOSIS — J45991 Cough variant asthma: Secondary | ICD-10-CM | POA: Diagnosis not present

## 2019-01-02 DIAGNOSIS — W19XXXA Unspecified fall, initial encounter: Secondary | ICD-10-CM | POA: Diagnosis not present

## 2019-01-02 DIAGNOSIS — S0990XA Unspecified injury of head, initial encounter: Secondary | ICD-10-CM | POA: Diagnosis not present

## 2019-01-02 DIAGNOSIS — Z87891 Personal history of nicotine dependence: Secondary | ICD-10-CM | POA: Diagnosis not present

## 2019-01-02 DIAGNOSIS — M25522 Pain in left elbow: Secondary | ICD-10-CM | POA: Diagnosis not present

## 2019-01-02 DIAGNOSIS — S59902A Unspecified injury of left elbow, initial encounter: Secondary | ICD-10-CM | POA: Diagnosis not present

## 2019-01-02 DIAGNOSIS — R05 Cough: Secondary | ICD-10-CM | POA: Diagnosis not present

## 2019-01-02 DIAGNOSIS — S80211A Abrasion, right knee, initial encounter: Secondary | ICD-10-CM | POA: Diagnosis not present

## 2019-01-02 DIAGNOSIS — S51019A Laceration without foreign body of unspecified elbow, initial encounter: Secondary | ICD-10-CM

## 2019-01-02 MED ORDER — GABAPENTIN 300 MG PO CAPS: 300.0000 mg | ORAL_CAPSULE | Freq: Two times a day (BID) | ORAL | 1 refills | Status: DC

## 2019-01-02 MED ORDER — LIDOCAINE HCL (PF) 1 % IJ SOLN
2.0000 mL | Freq: Once | INTRAMUSCULAR | Status: AC
  Administered 2019-01-02: 2 mL
  Filled 2019-01-02: qty 5

## 2019-01-02 NOTE — ED Provider Notes (Signed)
[place] EMERGENCY DEPARTMENT Provider Note   CSN: [id number] Arrival date & time: 01/02/19  1439     History   Chief Complaint Chief Complaint  Patient presents with  . Fall    HPI Oscar Baker is a 85 y.o. male.     Patient fell onto his left side outside.  States that his tetanus is up-to-date.  There was no loss of consciousness.  Patient with complaint of abrasions to both knees a skin tear to the left elbow and a laceration to the left lateral eye area.  Denies any neck pain.  No other extremity pain.  Does have some discomfort in the left elbow but denies any significant knee pain.  No hip pain.  Patient able to ambulate following the fall.     Past Medical History:  Diagnosis Date  . Asthma 1950's   history of  . Atrial fibrillation ([place])   . Bilateral carpal tunnel syndrome   . Bilateral lower extremity edema   . Bladder tumor   . Chronic systolic heart failure (HCC)    Echo 1/19: Mild LVH, EF 45-50, inf HK, MAC, severe LAE, severe RAE // Echo 7/15: Mild LVH, mod focal basal sept hypertrophy, EF 55-60, AV peak and mean 16/9, trivial MR, mod LAE, PASP 38  . CLL (chronic lymphocytic leukemia) [facility]) oncologist-  dr Ilene Qua--   dx [phone] ;  Lymphocytosis, CLL - per lov note 05-11-2017 currently under active survillance,  CT 04-17-2014 show very small lymphadenopathy, no indication for treatment  . Coronary artery disease    cardiologist-  dr Cathie Olden--  08-18-2017 Intermittant risk nuclear study w/ large area of inferior infartion with no evidence ishcemia   . 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 Diabetes mellitus type 2, noninsulin dependent ([place])   . Elevated PSA    since prostatectomy but now resolved  . Hematuria 04/2017  . History of ear infection    Right  . History of MI (myocardial infarction)    per myoview nuclear study 08-18-2017 , unknown when  . History of shingles 08/2017   L ear and scalp, possible  . Hyperlipidemia   . Hypertension   .  Ischemic cardiomyopathy 09/01/2017   Presumed +CAD with Nuclear stress test 08/18/17 - Inferior scar, no ischemia, intermediate risk // med management unless +angina or worse dyspnea  . OA (osteoarthritis)   . Pacemaker 02/08/2014   followed by dr g. taylor--  single chamber Biotronik due to SSS  . Permanent atrial fibrillation   . Pneumonia 2019   Left lung  . Prostate cancer [facility]) urologist-  dr Diona Fanti   dx 2004--  Gleason 8, PSA 10.45--  11-28-2002  s/p  radical prostatectomy;  recurrent w/ increasing PSA, started ADT treatment  . RBBB (right bundle branch block)   . Sick sinus syndrome ([place])    a-Flutter with episodes of bradycardia; S/P Biotronik (serial number [id number]) 02-08-2014  . Urinary incontinence   . Wears hearing aid in right ear    receiver and transmitter    Patient Active Problem List   Diagnosis Date Noted  . Chronic bronchitis with productive mucopurulent cough ([place]) 08/22/2018  . Acute systolic heart failure ([place]) 08/02/2018  . Type 2 diabetes mellitus ([place]) 08/02/2018  . Pain in joint of right shoulder 06/08/2018  . CAD (coronary artery disease) 05/21/2018  . Asthma exacerbation 05/21/2018  . Aspiration pneumonia ([place]) 05/21/2018  . Elevated INR   . Shoulder strain, right, initial encounter   .  Acute respiratory failure with hypoxia ([place]) 05/20/2018  . Bilateral impacted cerumen 02/14/2018  . Asymmetric SNHL (sensorineural hearing loss) 02/14/2018  . Encounter for therapeutic drug monitoring 12/20/2017  . S/P thoracotomy 11/29/2017  . Sepsis ([place])   . Hypoxemia   . Pneumonia of left lower lobe due to infectious organism ([place])   . Mediastinal lymphadenopathy   . Chronic cough 11/23/2017  . CLL (chronic lymphocytic leukemia) ([place]) 11/23/2017  . Pleural effusion 11/23/2017  . HCAP (healthcare-associated pneumonia) 11/20/2017  . Anemia 11/20/2017  . Mixed conductive and sensorineural hearing loss of right ear with restricted hearing of left ear 09/14/2017   . Abnormal hearing test 09/06/2017  . Cellulitis 09/06/2017  . Lymphadenitis, acute 09/06/2017  . Ischemic cardiomyopathy 09/01/2017  . S/p bilateral revision of total hip arthroplasty 08/11/2017  . History of revision of total replacement of left hip joint 08/11/2017  . Right hip pain   . Acute otitis media   . Chronic systolic CHF (congestive heart failure) ([place])   . Pressure injury of skin 07/20/2017  . Pressure ulcer 07/20/2017  . Malignant otitis externa 07/19/2017  . Fall in home 07/19/2017  . Acute on chronic diastolic heart failure ([place]) 07/19/2017  . S/P left THA, AA 05/12/2016  . History of repair of hip joint 05/12/2016  . Laryngopharyngeal reflux 05/21/2015  . Allergic rhinitis 05/21/2015  . Obesity 12/12/2014  . Upper airway cough syndrome 12/11/2014  . Posterior rhinorrhea 12/11/2014  . Cough variant asthma 07/27/2014  . Wheezing 06/20/2014  . Symptomatic bradycardia - s/p Biotronik (serial number [id number]) 02/08/2014  . Bradycardia 02/08/2014  . Diabetes mellitus type 2, noninsulin dependent ([place])   . Mixed incontinence 03/09/2013  . Edema of foot 03/06/2013  . Traumatic ecchymosis of right foot 03/06/2013  . Contusion of foot 03/06/2013  . Malignant neoplasm of prostate ([place]) 02/15/2013  . Diabetes mellitus without complication ([place]) 17/00/1749  . Bone spur 12/30/2012  . Pain of toe of left foot 12/30/2012  . Hyperlipidemia 07/13/2012  . Hypertensive disorder 04/12/2012  . Sick sinus syndrome ([place]) 08/17/2011  . PAF (paroxysmal atrial fibrillation) ([place]) 09/16/2010    Past Surgical History:  Procedure Laterality Date  . APPENDECTOMY    . BACK SURGERY     disk  . CARDIAC CATHETERIZATION  09-03-1999  dr Cathie Olden   abnormal cardiolite study:  minor luminal irregularities but no critial coronary artery stenosis  . CARDIOVASCULAR STRESS TEST  08-18-2017  dr Cathie Olden   Intermediate risk nuclear study w/ large area inferior infarction, no evidence of ishcemia  (consistant w/ prior MI)/  study not gated due to frequent PVCs  . CARPAL TUNNEL RELEASE Right 2000  . CARPAL TUNNEL RELEASE Left 11/19/2009  . CATARACT EXTRACTION Right 07/2015  . CATARACT EXTRACTION Left 09/2015  . CYSTOSCOPY N/A 11/04/2017   Procedure: CYSTOSCOPY AND CAUTERIZATION OF BLADDER;  Surgeon: Franchot Gallo, MD;  Location: [facility];  Service: Urology;  Laterality: N/A;  . INSERTION PENILE PROSTHESIS  02-22-2004    dr Mattie Marlin  [facility]  . IR THORACENTESIS ASP PLEURAL SPACE W/IMG GUIDE  11/26/2017  . KNEE ARTHROSCOPY Left 07/2010  . PERMANENT PACEMAKER INSERTION N/A 02/08/2014   Procedure: PERMANENT PACEMAKER INSERTION;  Surgeon: Evans Lance, MD;  Location: [facility] CATH LAB;  Service: Cardiovascular;  Laterality: N/A;  . PLEURAL EFFUSION DRAINAGE Left 11/30/2017   Procedure: DRAINAGE OF HEMOTHORAX;  Surgeon: Ivin Poot, MD;  Location: [place];  Service: Thoracic;  Laterality: Left;  . RADICAL RETROPUBIC  PROSTATECTOMY W/ BILATERAL PELVIC LYMPH NODE DISSECTION  11-28-2002   dr Mattie Marlin  [facility]  . TONSILLECTOMY    . TOTAL HIP ARTHROPLASTY Left 05/12/2016   Procedure: LEFT TOTAL HIP ARTHROPLASTY ANTERIOR APPROACH;  Surgeon: Paralee Cancel, MD;  Location: [facility];  Service: Orthopedics;  Laterality: Left;  . TOTAL HIP ARTHROPLASTY Right 07-15-2006   dr Alvan Dame  [facility]  . TRANSTHORACIC ECHOCARDIOGRAM  07/20/2017   mild LVH, ef 45-50%, hypokinesis of the basal-midinferior myocardium, due to AFib unable to evaluate diastolic function/  severe LAE and RAE/  trivial PR and TR  . VIDEO ASSISTED THORACOSCOPY Left 11/30/2017   Procedure: VIDEO ASSISTED THORACOSCOPY;  Surgeon: Ivin Poot, MD;  Location: [place];  Service: Thoracic;  Laterality: Left;  [name] VIDEO ASSISTED THORACOSCOPY (VATS)/EMPYEMA Left 11/29/2017   Procedure: VIDEO ASSISTED THORACOSCOPY (VATS)/EMPYEMA;  Surgeon: Ivin Poot, MD;  Location: [place];  Service: Thoracic;  Laterality: Left;        Home Medications     Prior to Admission medications   Medication Sig Start Date End Date Taking? Authorizing Provider  acetaminophen (TYLENOL) 500 MG tablet Take 2 tablets (1,000 mg total) by mouth every 6 (six) hours. 12/05/17   Elgie Collard, PA-C  atorvastatin (LIPITOR) 20 MG tablet TAKE ONE TABLET EACH DAY AT 6PM 03/07/18   Nahser, Wonda Cheng, MD  carvedilol (COREG) 3.125 MG tablet TAKE ONE TABLET TWICE DAILY 09/07/18   Evans Lance, MD  fenofibrate micronized (LOFIBRA) 200 MG capsule TAKE ONE CAPSULE EACH DAY BEFORE BREAKFAST 09/07/18   Nahser, Wonda Cheng, MD  furosemide (LASIX) 20 MG tablet TAKE ONE TABLET DAILY AS NEEDED 09/07/18   Evans Lance, MD  gabapentin (NEURONTIN) 300 MG capsule Take 1 capsule (300 mg total) by mouth 2 (two) times daily. 01/02/19   Olalere, Cicero Duck A, MD  glipiZIDE (GLUCOTROL XL) 2.5 MG 24 hr tablet Take 2.5 mg by mouth daily with breakfast.    [provider]  guaiFENesin (MUCINEX) 600 MG 12 hr tablet Take 600 mg by mouth as needed.     [provider]  HYDROcodone-homatropine (HYCODAN) 5-1.5 MG/5ML syrup Take 5 mLs by mouth every 6 (six) hours as needed for cough. 06/23/18   Martyn Ehrich, NP  ipratropium (ATROVENT) 0.02 % nebulizer solution Take 2.5 mLs (0.5 mg total) by nebulization 3 (three) times daily. 10/04/18   Fenton Foy, NP  metFORMIN (GLUCOPHAGE) 1000 MG tablet Take 1,000 mg by mouth 2 (two) times daily with a meal.      [provider]  mupirocin ointment (BACTROBAN) 2 %  08/31/18   [provider]  Nebulizers (VIOS LC SPRINT DELUXE) MISC Use 1 each every 6 (six) hours as needed (shortness of breath or wheezing) Fax to [facility] 08/22/18   [provider]  sitaGLIPtin (JANUVIA) 100 MG tablet Take 100 mg by mouth daily.    [provider]  sodium chloride HYPERTONIC 3 % nebulizer solution Take by nebulization 3 (three) times daily. 09/20/18   Fenton Foy, NP  vitamin B-12 (CYANOCOBALAMIN) 1000 MCG tablet Take 1,000 mcg  by mouth daily. Will stop prior to procedure    [provider]  warfarin (COUMADIN) 5 MG tablet TAKE AS DIRECTED BY COUMADIN CLINIC 09/07/18   Evans Lance, MD    Family History Family History  Problem Relation Age of Onset  . Emphysema Mother   . Allergic rhinitis Mother   . Heart attack Father   . Allergic rhinitis  Brother   . Pulmonary fibrosis Brother     Social History Social History   Tobacco Use  . Smoking status: Former Smoker    Years: 25.00    Types: Pipe, Cigars    Quit date: 11/25/1975    Years since quitting: 43.1  . Smokeless tobacco: Never Used  Substance Use Topics  . Alcohol use: Yes    Alcohol/week: 0.0 standard drinks    Comment: occasional  . Drug use: No     Allergies   Altace [ramipril], Augmentin [amoxicillin-pot clavulanate], Codeine, and Simvastatin   Review of Systems Review of Systems  Constitutional: Negative for chills and fever.  HENT: Negative for congestion, rhinorrhea and sore throat.   Eyes: Negative for pain and visual disturbance.  Respiratory: Negative for cough and shortness of breath.   Cardiovascular: Negative for chest pain and leg swelling.  Gastrointestinal: Negative for abdominal pain, diarrhea, nausea and vomiting.  Genitourinary: Negative for dysuria.  Musculoskeletal: Negative for back pain and neck pain.  Skin: Positive for wound. Negative for rash.  Neurological: Negative for dizziness, light-headedness and headaches.  Hematological: Bruises/bleeds easily.  Psychiatric/Behavioral: Negative for confusion.     Physical Exam Updated Vital Signs BP 123/73 (BP Location: Right Arm)   Pulse 74   Temp 98.3 F (36.8 C) (Oral)   Resp 20   SpO2 98%   Physical Exam Vitals signs and nursing note reviewed.  Constitutional:      General: He is not in acute distress.    Appearance: Normal appearance. He is well-developed.  HENT:     Head: Normocephalic.     Comments: Patient with a 3 cm laceration to the  left lateral aspect of the orbit on the facial skin.  No significant bleeding.  No facial nerve defect noted. Eyes:     Extraocular Movements: Extraocular movements intact.     Conjunctiva/sclera: Conjunctivae normal.     Pupils: Pupils are equal, round, and reactive to light.  Neck:     Musculoskeletal: Normal range of motion and neck supple. No muscular tenderness.     Comments: No posterior tenderness to the cervical spine or neck area.  Good range of motion. Cardiovascular:     Rate and Rhythm: Normal rate and regular rhythm.     Heart sounds: No murmur.  Pulmonary:     Effort: Pulmonary effort is normal. No respiratory distress.     Breath sounds: Normal breath sounds.  Abdominal:     Palpations: Abdomen is soft.     Tenderness: There is no abdominal tenderness.  Musculoskeletal:        General: Signs of injury present.     Comments: Patient with bilateral knee abrasions both superficial left a little greater than right.  Left elbow with a skin tear has irregular measuring maybe 4 cm.  No significant bleeding.  No significant swelling.  Skin:    General: Skin is warm and dry.     Capillary Refill: Capillary refill takes less than 2 seconds.  Neurological:     General: No focal deficit present.     Mental Status: He is alert and oriented to person, place, and time.     Cranial Nerves: No cranial nerve deficit.     Sensory: No sensory deficit.     Motor: No weakness.      ED Treatments / Results  Labs (all labs ordered are listed, but only abnormal results are displayed) Labs Reviewed - No data to display  EKG None  Radiology Dg Chest 2 View  Result Date: 01/02/2019 CLINICAL DATA:  Cough EXAM: CHEST - 2 VIEW COMPARISON:  05/20/2018 FINDINGS: UPPER limits normal heart size and LEFT pacemaker again noted. Mild chronic peribronchial thickening again identified. There is no evidence of focal airspace disease, pulmonary edema, suspicious pulmonary nodule/mass, pleural  effusion, or pneumothorax. No acute bony abnormalities are identified. IMPRESSION: No active cardiopulmonary disease. Electronically Signed   By: Margarette Canada M.D.   On: 01/02/2019 13:59   Dg Elbow Complete Left  Result Date: 01/02/2019 CLINICAL DATA:  Trip and fall with elbow pain, initial encounter EXAM: LEFT ELBOW - COMPLETE 3+ VIEW COMPARISON:  None. FINDINGS: There is no evidence of fracture, dislocation, or joint effusion. There is no evidence of arthropathy or other focal bone abnormality. Mild soft tissue swelling over the olecranon is seen. IMPRESSION: No acute bony abnormality noted. Electronically Signed   By: Inez Catalina M.D.   On: 01/02/2019 16:56   Ct Head Wo Contrast  Result Date: 01/02/2019 CLINICAL DATA:  85 year old who tripped and fell at home at approximately 2 o'clock this afternoon, sustaining a laceration to the LEFT LATERAL periorbital location. Initial encounter. EXAM: CT HEAD WITHOUT CONTRAST TECHNIQUE: Contiguous axial images were obtained from the base of the skull through the vertex without intravenous contrast. COMPARISON:  11/20/2017 and earlier. FINDINGS: Brain: Severe age related cortical and deep atrophy and severe cerebellar atrophy, unchanged. Minimal changes of small vessel disease of the white matter, unchanged. No mass lesion. No midline shift. No acute hemorrhage or hematoma. No extra-axial fluid collections. No evidence of acute infarction. Vascular: Severe BILATERAL carotid siphon and vertebral artery atherosclerosis. No hyperdense vessel. Skull: No skull fracture or other focal osseous abnormality involving the skull. Sinuses/Orbits: Near complete opacification of both maxillary sinuses. Opacification of ANTERIOR and MIDDLE ethmoid air cells bilaterally. Complete opacification of the RIGHT frontal sinus. LEFT frontal sinus and BILATERAL sphenoid sinuses well aerated. Opacification of mastoid air cells on the RIGHT. LEFT mastoid air cells and BILATERAL middle ear  cavities well-aerated. Calcification involving the LEFT orbit at the site of the optic nerve. Small subcutaneous hematoma adjacent to the LEFT LATERAL orbital wall. Other: None. IMPRESSION: 1. No acute intracranial abnormality. 2. Stable severe generalized atrophy and minimal chronic microvascular ischemic changes of the white matter. 3. Small subcutaneous hematoma adjacent to the LEFT LATERAL orbital wall without visible fracture. 4. Chronic bilateral maxillary, bilateral ethmoid and RIGHT frontal sinusitis. 5. RIGHT mastoid effusion. 6. Benign calcified optic drusen involving the LEFT eye. Electronically Signed   By: Evangeline Dakin M.D.   On: 01/02/2019 17:01    Procedures Procedures (including critical care time)  Medications Ordered in ED Medications  lidocaine (PF) (XYLOCAINE) 1 % injection 2 mL (2 mLs Infiltration Given 01/02/19 1803)     Initial Impression / Assessment and Plan / ED Course  I have reviewed the triage vital signs and the nursing notes.  Pertinent labs & imaging results that were available during my care of the patient were reviewed by me and considered in my medical decision making (see chart for details).       X-ray of the left elbow without any bony abnormality.  CT of the head without any acute findings.  No evidence of any orbital fracture or blood on the brain or skull fracture.  Wound care for the left elbow skin tear.  Will be cleaned and dressed with antibiotic ointment.  Physician assistant repaired the left facial laceration.  Absorbable sutures were used  on the facial lacerations sutures will not need to be removed.  Patient will keep that wound dry for 24 hours.  We will follow-up with his primary care doctor regarding the skin tear.  Would recommend keeping the dressing on for 2 days then remove wash with soap and water apply back to trace and ointment and then redress.     Final Clinical Impressions(s) / ED Diagnoses   Final diagnoses:  Fall,  initial encounter  Skin tear of elbow without complication, initial encounter  Facial laceration, initial encounter    ED Discharge Orders    None       Fredia Sorrow, MD 01/02/19 1814

## 2019-01-02 NOTE — Patient Instructions (Signed)
Chronic cough related to aspiration  Only use hypertonic saline as needed for tenacious sputum  We will give you a prescription for Neurontin to be used for the cough  We will see you in 4 to 6 weeks Chest x-ray  Call with significant concerns  Continue to watch for risks with aspiration-getting food down the wrong path

## 2019-01-02 NOTE — ED Triage Notes (Addendum)
Pt states he tripped/fell ~2pm-pt entered triage with own rollater-NAD-pain to left hand/left elbow, both knees- left temporal area

## 2019-01-02 NOTE — ED Provider Notes (Signed)
Please see Dr. Gloris [place] note for full H&P.  Laceration repair performed @ his request per procedure note below.  LACERATION REPAIR Performed by: Kennith Maes Authorized by: Kennith Maes Consent: Verbal consent obtained. Risks and benefits: risks, benefits and alternatives were discussed Consent given by: patient Patient identity confirmed: provided demographic data Prepped and Draped in normal sterile fashion Wound explored  Laceration Location: Just lateral to L eyebrow  Laceration Length: 3 cm  No Foreign Bodies seen or palpated  Anesthesia: local infiltration  Local anesthetic: lidocaine 1% w/o epinephrine  Anesthetic total: 3 ml  Irrigation method: pressure w/ sterile water Amount of cleaning: standard  Skin closure: Sutures  Number of sutures: 2  Type of sutures: 5-0 Vicryl Rapide  Technique: Simple interrupted  Patient tolerance: Patient tolerated the procedure well with no immediate complications.   Amaryllis Dyke, PA-C 01/02/19 1759    Fredia Sorrow, MD 01/02/19 1806

## 2019-01-02 NOTE — Discharge Instructions (Addendum)
Keep the facial laceration dry for 24 hours then you can shower.  The sutures are absorbable so they do not need to be removed.  For the left elbow skin tear keep the dressing on for 2 days and then remove apply antibiotic ointment bacitracin ointment be required due to your allergies and then redress and do that daily.  Wash with soap and water in between.  Make an appointment follow-up with your doctor to have the skin tear followed up.  Return for any new or worse symptoms.

## 2019-01-02 NOTE — Progress Notes (Signed)
_0  ID: Oscar Baker, male    DOB: February 07, 1933, 85 y.o.   MRN: [id number]   Chief complaint: Chronic cough Cough secondary to aspiration  Referring provider: [name], MD  HPI: 85 yo male former smoker (mainly pipe smoker )  followed for chronic cough  and sinus disease  last seen in office 2016 . Seen by PCCM during hospitalization 11/2017 with Bacteremia and Empyema .  PMH significant for CHF /CM , A Fib , Pacemaker ,CLL  01/02/2019  Protracted cough Feels cough is unchanged Has been using hypertonic saline-burns his throat Hycodan seem to help Dysphagia modifications does not seem to help much Has more cough when he is laying flat Clear to yellow secretions Rhonchi-bronchodilators to help  Usually coughs more and clear secretions when he is laying flat  Sleeping in the recliner does help   has not been feeling poorly Has no fevers or chills Denies any chest pains or discomfort Continues to refuse to adhere to a dysphagia diet completely  Significant history:  Left lower lobe pneumonia in 2019 He required placement in a skilled nursing facility.  He was treated with antibiotics.  But developed a progressive left pleural effusion which was quite extensive into the entire left pleural space.  Patient was seen by thoracic surgery and underwent thoracoscopy for empyema .patient had postop complications and required reexploration on June 11 to remove clots in the pleural space.  A left VATS with decortication.  Found to have H influenza bacteremia felt from a pulmonary source.  Was treated with aggressive IV antibiotics. Since discharge patient is feeling better, getting stronger, is now back home. Using walker.  Has finished PT at home , going to go to OP PT.   He is not coughing up discolored mucus says he feels good. No fever or weight loss.   Wife says he does not follow aspiration diet . Found to be at risk for aspiration and rec on D 3 diet with nectar thick liquids  .  He is trying to follow aspiration precautions, difficult.   Allergies  Allergen Reactions  . Altace [Ramipril] Other (See Comments)    "throat felt like had a knot in it"  . Augmentin [Amoxicillin-Pot Clavulanate] Diarrhea    severe  . Codeine Nausea And Vomiting    Nausea and vomiting   . Simvastatin Other (See Comments)    Leg aches    Immunization History  Administered Date(s) Administered  . Tdap 07/19/2017    Past Medical History:  Diagnosis Date  . Asthma 1950's   history of  . Atrial fibrillation ([place])   . Bilateral carpal tunnel syndrome   . Bilateral lower extremity edema   . Bladder tumor   . Chronic systolic heart failure (HCC)    Echo 1/19: Mild LVH, EF 45-50, inf HK, MAC, severe LAE, severe RAE // Echo 7/15: Mild LVH, mod focal basal sept hypertrophy, EF 55-60, AV peak and mean 16/9, trivial MR, mod LAE, PASP 38  . CLL (chronic lymphocytic leukemia) [facility]) oncologist-  dr Ilene Qua--   dx [phone] ;  Lymphocytosis, CLL - per lov note 05-11-2017 currently under active survillance,  CT 04-17-2014 show very small lymphadenopathy, no indication for treatment  . Coronary artery disease    cardiologist-  dr Cathie Olden--  08-18-2017 Intermittant risk nuclear study w/ large area of inferior infartion with no evidence ishcemia   . 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 Diabetes mellitus type 2, noninsulin dependent ([place])   .  Elevated PSA    since prostatectomy but now resolved  . Hematuria 04/2017  . History of ear infection    Right  . History of MI (myocardial infarction)    per myoview nuclear study 08-18-2017 , unknown when  . History of shingles 08/2017   L ear and scalp, possible  . Hyperlipidemia   . Hypertension   . Ischemic cardiomyopathy 09/01/2017   Presumed +CAD with Nuclear stress test 08/18/17 - Inferior scar, no ischemia, intermediate risk // med management unless +angina or worse dyspnea  . OA (osteoarthritis)   . Pacemaker 02/08/2014   followed by dr g. taylor--   single chamber Biotronik due to SSS  . Permanent atrial fibrillation   . Pneumonia 2019   Left lung  . Prostate cancer [facility]) urologist-  dr Diona Fanti   dx 2004--  Gleason 8, PSA 10.45--  11-28-2002  s/p  radical prostatectomy;  recurrent w/ increasing PSA, started ADT treatment  . RBBB (right bundle branch block)   . Sick sinus syndrome ([place])    a-Flutter with episodes of bradycardia; S/P Biotronik (serial number [id number]) 02-08-2014  . Urinary incontinence   . Wears hearing aid in right ear    receiver and transmitter    Tobacco History: Social History   Tobacco Use  Smoking Status Former Smoker  . Years: 25.00  . Types: Pipe, Cigars  . Quit date: 11/25/1975  . Years since quitting: 43.1  Smokeless Tobacco Never Used    Outpatient Medications Prior to Visit  Medication Sig Dispense Refill  . acetaminophen (TYLENOL) 500 MG tablet Take 2 tablets (1,000 mg total) by mouth every 6 (six) hours. 30 tablet 0  . atorvastatin (LIPITOR) 20 MG tablet TAKE ONE TABLET EACH DAY AT 6PM 90 tablet 3  . carvedilol (COREG) 3.125 MG tablet TAKE ONE TABLET TWICE DAILY 180 tablet 3  . fenofibrate micronized (LOFIBRA) 200 MG capsule TAKE ONE CAPSULE EACH DAY BEFORE BREAKFAST 90 capsule 2  . furosemide (LASIX) 20 MG tablet TAKE ONE TABLET DAILY AS NEEDED 90 tablet 3  . glipiZIDE (GLUCOTROL XL) 2.5 MG 24 hr tablet Take 2.5 mg by mouth daily with breakfast.    . guaiFENesin (MUCINEX) 600 MG 12 hr tablet Take 600 mg by mouth as needed.     [name] HYDROcodone-homatropine (HYCODAN) 5-1.5 MG/5ML syrup Take 5 mLs by mouth every 6 (six) hours as needed for cough. 240 mL 0  . ipratropium (ATROVENT) 0.02 % nebulizer solution Take 2.5 mLs (0.5 mg total) by nebulization 3 (three) times daily. 225 mL 3  . metFORMIN (GLUCOPHAGE) 1000 MG tablet Take 1,000 mg by mouth 2 (two) times daily with a meal.      . mometasone (ELOCON) 0.1 % lotion     . multivitamin (THERAGRAN) per tablet Take 1 tablet by mouth daily. Will stop  prior to procedure    . mupirocin ointment (BACTROBAN) 2 %     . Nebulizers (VIOS LC SPRINT DELUXE) MISC Use 1 each every 6 (six) hours as needed (shortness of breath or wheezing) Fax to [place]    . sitaGLIPtin (JANUVIA) 100 MG tablet Take 100 mg by mouth daily.    . sodium chloride HYPERTONIC 3 % nebulizer solution Take by nebulization 3 (three) times daily. 360 mL 3  . vitamin B-12 (CYANOCOBALAMIN) 1000 MCG tablet Take 1,000 mcg by mouth daily. Will stop prior to procedure    . warfarin (COUMADIN) 5 MG tablet TAKE AS DIRECTED BY COUMADIN CLINIC 120 tablet 0  .  chlorpheniramine (CHLOR-TRIMETON) 4 MG tablet Take 1 tablet (4 mg total) by mouth 2 (two) times daily as needed for up to 7 days for allergies. 14 tablet 0  . ipratropium (ATROVENT) 0.02 % nebulizer solution Inhale into the lungs.    . sodium chloride HYPERTONIC 3 % nebulizer solution Inhale into the lungs.     No facility-administered medications prior to visit.    Review of Systems  Constitutional: Negative.   HENT: Negative.   Eyes: Negative.   Respiratory: Positive for cough and sputum production. Negative for hemoptysis and shortness of breath.   Cardiovascular: Negative for chest pain.  Gastrointestinal: Negative.   Genitourinary: Positive for frequency and urgency.  Musculoskeletal: Negative.    Physical Exam  Constitutional: He is well-developed, well-nourished, and in no distress.  HENT:  Head: Normocephalic and atraumatic.  Eyes: Pupils are equal, round, and reactive to light. Conjunctivae and EOM are normal. Right eye exhibits no discharge. Left eye exhibits no discharge.  Neck: Normal range of motion. Neck supple. No tracheal deviation present. No thyromegaly present.  Cardiovascular: Normal rate.  Pulmonary/Chest: Effort normal. No respiratory distress. He has no wheezes.  Bilateral rhonchi  Abdominal: Soft. Bowel sounds are normal. He exhibits no distension. There is no abdominal tenderness.   Vitals:    01/02/19 1052  BP: 122/62  Pulse: 83  Temp: 98.5 F (36.9 C)  SpO2: 97%   Imaging: Chest x-ray from 2019 reviewed by myself FeNo 18 06/21/18   Assessment & Plan:  History of aspiration pneumonia Chronic cough -Cough related to aspiration -Encouraged to continue to adhere with aspiration precautions and dysphagia diet -We will continue to use Hycodan as needed -Hypertonic saline as needed -We will start him on Neurontin at 300 mg daily may increase to 300 twice daily -Watch for sedation side effect  We will see him back in the office in about 4 to 6 weeks Encouraged to call with any significant concerns  Plan: .  Continue aspiration precautions .  Continue Hycodan .  Add Neurontin .  See him back in about a month .  Encouraged to call with any significant concerns  Laurin Coder, MD 01/02/2019

## 2019-01-03 ENCOUNTER — Ambulatory Visit (INDEPENDENT_AMBULATORY_CARE_PROVIDER_SITE_OTHER): Payer: Medicare Other | Admitting: Podiatry

## 2019-01-03 ENCOUNTER — Encounter: Payer: Self-pay | Admitting: Podiatry

## 2019-01-03 VITALS — Temp 98.2°F

## 2019-01-03 DIAGNOSIS — M79671 Pain in right foot: Secondary | ICD-10-CM

## 2019-01-03 DIAGNOSIS — M79672 Pain in left foot: Secondary | ICD-10-CM

## 2019-01-03 DIAGNOSIS — M79676 Pain in unspecified toe(s): Secondary | ICD-10-CM | POA: Diagnosis not present

## 2019-01-03 DIAGNOSIS — Z9229 Personal history of other drug therapy: Secondary | ICD-10-CM

## 2019-01-03 DIAGNOSIS — B351 Tinea unguium: Secondary | ICD-10-CM

## 2019-01-03 NOTE — Patient Instructions (Signed)
Diabetes Mellitus and Foot Care Foot care is an important part of your health, especially when you have diabetes. Diabetes may cause you to have problems because of poor blood flow (circulation) to your feet and legs, which can cause your skin to:  Become thinner and drier.  Break more easily.  Heal more slowly.  Peel and crack. You may also have nerve damage (neuropathy) in your legs and feet, causing decreased feeling in them. This means that you may not notice minor injuries to your feet that could lead to more serious problems. Noticing and addressing any potential problems early is the best way to prevent future foot problems. How to care for your feet Foot hygiene  Wash your feet daily with warm water and mild soap. Do not use hot water. Then, pat your feet and the areas between your toes until they are completely dry. Do not soak your feet as this can dry your skin.  Trim your toenails straight across. Do not dig under them or around the cuticle. File the edges of your nails with an emery board or nail file.  Apply a moisturizing lotion or petroleum jelly to the skin on your feet and to dry, brittle toenails. Use lotion that does not contain alcohol and is unscented. Do not apply lotion between your toes. Shoes and socks  Wear clean socks or stockings every day. Make sure they are not too tight. Do not wear knee-high stockings since they may decrease blood flow to your legs.  Wear shoes that fit properly and have enough cushioning. Always look in your shoes before you put them on to be sure there are no objects inside.  To break in new shoes, wear them for just a few hours a day. This prevents injuries on your feet. Wounds, scrapes, corns, and calluses  Check your feet daily for blisters, cuts, bruises, sores, and redness. If you cannot see the bottom of your feet, use a mirror or ask someone for help.  Do not cut corns or calluses or try to remove them with medicine.  If you  find a minor scrape, cut, or break in the skin on your feet, keep it and the skin around it clean and dry. You may clean these areas with mild soap and water. Do not clean the area with peroxide, alcohol, or iodine.  If you have a wound, scrape, corn, or callus on your foot, look at it several times a day to make sure it is healing and not infected. Check for: ? Redness, swelling, or pain. ? Fluid or blood. ? Warmth. ? Pus or a bad smell. General instructions  Do not cross your legs. This may decrease blood flow to your feet.  Do not use heating pads or hot water bottles on your feet. They may burn your skin. If you have lost feeling in your feet or legs, you may not know this is happening until it is too late.  Protect your feet from hot and cold by wearing shoes, such as at the beach or on hot pavement.  Schedule a complete foot exam at least once a year (annually) or more often if you have foot problems. If you have foot problems, report any cuts, sores, or bruises to your health care provider immediately. Contact a health care provider if:  You have a medical condition that increases your risk of infection and you have any cuts, sores, or bruises on your feet.  You have an injury that is not   healing.  You have redness on your legs or feet.  You feel burning or tingling in your legs or feet.  You have pain or cramps in your legs and feet.  Your legs or feet are numb.  Your feet always feel cold.  You have pain around a toenail. Get help right away if:  You have a wound, scrape, corn, or callus on your foot and: ? You have pain, swelling, or redness that gets worse. ? You have fluid or blood coming from the wound, scrape, corn, or callus. ? Your wound, scrape, corn, or callus feels warm to the touch. ? You have pus or a bad smell coming from the wound, scrape, corn, or callus. ? You have a fever. ? You have a red line going up your leg. Summary  Check your feet every day  for cuts, sores, red spots, swelling, and blisters.  Moisturize feet and legs daily.  Wear shoes that fit properly and have enough cushioning.  If you have foot problems, report any cuts, sores, or bruises to your health care provider immediately.  Schedule a complete foot exam at least once a year (annually) or more often if you have foot problems. This information is not intended to replace advice given to you by your health care provider. Make sure you discuss any questions you have with your health care provider. Document Released: 06/05/2000 Document Revised: 07/21/2017 Document Reviewed: 07/10/2016 Elsevier Patient Education  2020 Elsevier Inc.   Onychomycosis/Fungal Toenails  WHAT IS IT? An infection that lies within the keratin of your nail plate that is caused by a fungus.  WHY ME? Fungal infections affect all ages, sexes, races, and creeds.  There may be many factors that predispose you to a fungal infection such as age, coexisting medical conditions such as diabetes, or an autoimmune disease; stress, medications, fatigue, genetics, etc.  Bottom line: fungus thrives in a warm, moist environment and your shoes offer such a location.  IS IT CONTAGIOUS? Theoretically, yes.  You do not want to share shoes, nail clippers or files with someone who has fungal toenails.  Walking around barefoot in the same room or sleeping in the same bed is unlikely to transfer the organism.  It is important to realize, however, that fungus can spread easily from one nail to the next on the same foot.  HOW DO WE TREAT THIS?  There are several ways to treat this condition.  Treatment may depend on many factors such as age, medications, pregnancy, liver and kidney conditions, etc.  It is best to ask your doctor which options are available to you.  1. No treatment.   Unlike many other medical concerns, you can live with this condition.  However for many people this can be a painful condition and may lead to  ingrown toenails or a bacterial infection.  It is recommended that you keep the nails cut short to help reduce the amount of fungal nail. 2. Topical treatment.  These range from herbal remedies to prescription strength nail lacquers.  About 40-50% effective, topicals require twice daily application for approximately 9 to 12 months or until an entirely new nail has grown out.  The most effective topicals are medical grade medications available through physicians offices. 3. Oral antifungal medications.  With an 80-90% cure rate, the most common oral medication requires 3 to 4 months of therapy and stays in your system for a year as the new nail grows out.  Oral antifungal medications do require   blood work to make sure it is a safe drug for you.  A liver function panel will be performed prior to starting the medication and after the first month of treatment.  It is important to have the blood work performed to avoid any harmful side effects.  In general, this medication safe but blood work is required. 4. Laser Therapy.  This treatment is performed by applying a specialized laser to the affected nail plate.  This therapy is noninvasive, fast, and non-painful.  It is not covered by insurance and is therefore, out of pocket.  The results have been very good with a 80-95% cure rate.  The [facility] is the only practice in the area to offer this therapy. 5. Permanent Nail Avulsion.  Removing the entire nail so that a new nail will not grow back. 

## 2019-01-04 ENCOUNTER — Telehealth: Payer: Self-pay | Admitting: Pulmonary Disease

## 2019-01-04 NOTE — Telephone Encounter (Signed)
Call returned to patient, he states was given gabapentin by AO, and he looked it up on google and now he is afraid to take it. I explained that Dr. Bridgette Habermann is knowledgeable and would not have placed him on it if he did not think it was safe. I explained that I could send him a message for his input however the patient wanted to verify that I was a nurse and states he was fine talking to me as long it was safe for him. No allergy noted to gabapentin. He also wanted to know how he should be taking his hydrocodone cough syrup. I explained the cough syrup was to be taken as needed and his Gabapentin should be taken twice a day. The patient voiced being thankful for my help and he was impressed with the quick turn around on the call. I told this patient to please call back with any questions in the future. Voiced understanding. Nothing further is needed at this time.

## 2019-01-05 ENCOUNTER — Other Ambulatory Visit: Payer: Self-pay

## 2019-01-05 ENCOUNTER — Ambulatory Visit (INDEPENDENT_AMBULATORY_CARE_PROVIDER_SITE_OTHER): Payer: Medicare Other | Admitting: *Deleted

## 2019-01-05 DIAGNOSIS — I4821 Permanent atrial fibrillation: Secondary | ICD-10-CM | POA: Diagnosis not present

## 2019-01-05 LAB — POCT INR: INR: 2.6 (ref 2.0–3.0)

## 2019-01-05 NOTE — Patient Instructions (Addendum)
Description   Continue taking 1 tablet daily except 1.5 tablets on Tuesdays and Saturdays. Recheck in 3 weeks.  Call [facility] [phone] with any concerns.

## 2019-01-08 NOTE — Progress Notes (Signed)
Subjective:  Oscar Baker presents to clinic today with cc of  painful, thick, discolored, elongated toenails 1-5 b/l that become tender and cannot cut because of thickness.  Pain is aggravated when wearing enclosed shoe gear.  He is on Coumadin.   He voices no new pedal concerns on today's visit.   Current Outpatient Medications:  .  acetaminophen (TYLENOL) 500 MG tablet, Take 2 tablets (1,000 mg total) by mouth every 6 (six) hours., Disp: 30 tablet, Rfl: 0 .  Aspirin Buf,CaCarb-MgCarb-MgO, 81 MG TABS, aspirin 81 mg tablet   81 mg by oral route., Disp: , Rfl:  .  atorvastatin (LIPITOR) 20 MG tablet, TAKE ONE TABLET EACH DAY AT 6PM, Disp: 90 tablet, Rfl: 3 .  carvedilol (COREG) 3.125 MG tablet, TAKE ONE TABLET TWICE DAILY, Disp: 180 tablet, Rfl: 3 .  fenofibrate micronized (LOFIBRA) 200 MG capsule, TAKE ONE CAPSULE EACH DAY BEFORE BREAKFAST, Disp: 90 capsule, Rfl: 2 .  furosemide (LASIX) 20 MG tablet, TAKE ONE TABLET DAILY AS NEEDED, Disp: 90 tablet, Rfl: 3 .  gabapentin (NEURONTIN) 300 MG capsule, Take 1 capsule (300 mg total) by mouth 2 (two) times daily., Disp: 60 capsule, Rfl: 1 .  glipiZIDE (GLUCOTROL XL) 2.5 MG 24 hr tablet, Take 2.5 mg by mouth daily with breakfast., Disp: , Rfl:  .  guaiFENesin (MUCINEX) 600 MG 12 hr tablet, Take 600 mg by mouth as needed. , Disp: , Rfl:  .  HYDROcodone-homatropine (HYCODAN) 5-1.5 MG/5ML syrup, Take 5 mLs by mouth every 6 (six) hours as needed for cough., Disp: 240 mL, Rfl: 0 .  ipratropium (ATROVENT) 0.02 % nebulizer solution, Take 2.5 mLs (0.5 mg total) by nebulization 3 (three) times daily., Disp: 225 mL, Rfl: 3 .  metFORMIN (GLUCOPHAGE) 1000 MG tablet, Take 1,000 mg by mouth 2 (two) times daily with a meal.  , Disp: , Rfl:  .  Multiple Vitamin (MULTI-VITAMIN) tablet, Take by mouth., Disp: , Rfl:  .  mupirocin ointment (BACTROBAN) 2 %, , Disp: , Rfl:  .  Nebulizers (VIOS LC SPRINT DELUXE) MISC, Use 1 each every 6 (six) hours as needed (shortness  of breath or wheezing) Fax to [facility], Disp: , Rfl:  .  sitaGLIPtin (JANUVIA) 100 MG tablet, Take 100 mg by mouth daily., Disp: , Rfl:  .  sodium chloride HYPERTONIC 3 % nebulizer solution, Take by nebulization 3 (three) times daily., Disp: 360 mL, Rfl: 3 .  vitamin B-12 (CYANOCOBALAMIN) 1000 MCG tablet, Take 1,000 mcg by mouth daily. Will stop prior to procedure, Disp: , Rfl:  .  warfarin (COUMADIN) 5 MG tablet, TAKE AS DIRECTED BY COUMADIN CLINIC, Disp: 120 tablet, Rfl: 0   Allergies  Allergen Reactions  . Altace [Ramipril] Other (See Comments)    "throat felt like had a knot in it"  . Augmentin [Amoxicillin-Pot Clavulanate] Diarrhea    severe  . Codeine Nausea And Vomiting    Nausea and vomiting   . Simvastatin Other (See Comments)    Leg aches     Objective: Vitals:   01/03/19 0948  Temp: 98.2 F (36.8 C)    Physical Examination:  Vascular Examination: Capillary refill time <3 seconds x 10 digits.  Palpable DP/PT pulses b/l.  Digital hair absent b/l.  No edema noted b/l.  Skin temperature gradient WNL b/l.  Dermatological Examination: Skin with normal turgor, texture and tone b/l.  No open wounds b/l.  No interdigital macerations noted b/l.  Elongated, thick, discolored brittle toenails with subungual debris and pain  on dorsal palpation of nailbeds 1-5 b/l.  Musculoskeletal Examination: Muscle strength 5/5 b/l to all muscle groups b/l.  Contracted digits 2-5 b/l.  No pain, crepitus or joint discomfort with active/passive ROM.  Neurological Examination: Sensation intact 5/5 b/l with 10 gram monofilament.  Vibratory sensation intact b/l.  Proprioceptive sensation intact b/l.  Assessment: Mycotic nail infection with pain 1-5 b/l Patient on long term blood thinner  Plan: 1. Toenails 1-5 b/l were debrided in length and girth without iatrogenic laceration. 2.  Continue soft, supportive shoe gear daily. 3.  Report any pedal injuries to medical  professional. 4.  Follow up 3 months. 5.  Patient/POA to call should there be a question/concern in there interim.

## 2019-01-09 ENCOUNTER — Other Ambulatory Visit: Payer: Self-pay | Admitting: Internal Medicine

## 2019-01-09 ENCOUNTER — Ambulatory Visit (INDEPENDENT_AMBULATORY_CARE_PROVIDER_SITE_OTHER): Payer: Medicare Other | Admitting: *Deleted

## 2019-01-09 DIAGNOSIS — I495 Sick sinus syndrome: Secondary | ICD-10-CM

## 2019-01-11 LAB — CUP PACEART REMOTE DEVICE CHECK
Date Time Interrogation Session: 20200722115356
Implantable Lead Implant Date: 20150820
Implantable Lead Location: 753860
Implantable Lead Model: 350
Implantable Lead Serial Number: 29625633
Implantable Pulse Generator Implant Date: 20150820
Pulse Gen Model: 394934
Pulse Gen Serial Number: 68372291

## 2019-01-24 ENCOUNTER — Encounter: Payer: Self-pay | Admitting: Cardiology

## 2019-01-24 NOTE — Progress Notes (Signed)
Remote pacemaker transmission.   

## 2019-01-25 IMAGING — CT CT HEAD W/O CM
4 series · 15 of 47 positions shown, 17 images · non-contrast
Comparison: 07/20/2017

CLINICAL DATA: Tripped and fell 3 days ago, hitting the head.
Bruising to the right temple. On anticoagulation. Initial encounter.

EXAM:
CT HEAD WITHOUT CONTRAST
TECHNIQUE: Contiguous axial images were obtained from the base of the skull
through the vertex without intravenous contrast.

[Series 3: head bone · axial · 0.46mm/px · z∈[-84,-68]mm · 2 of 78 slices shown]
[im 8/78  bone]
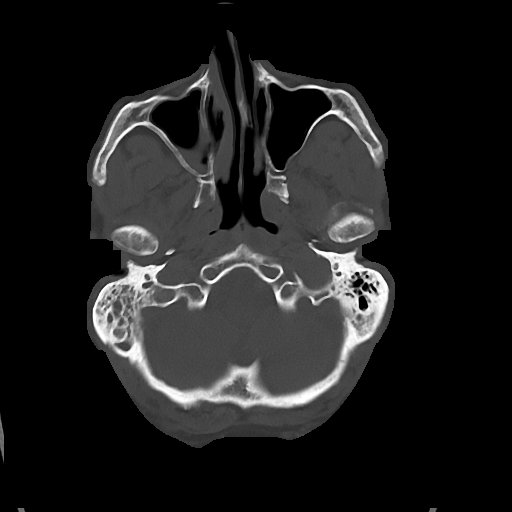
[im 16/78  bone]
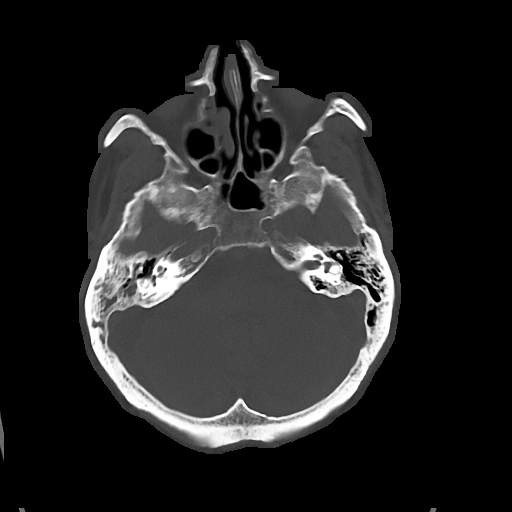

[Series 4: head wo · axial · 0.46mm/px · z∈[-83,+37]mm · 7 of 32 slices shown, 9 images]
[im 4/32  brain]
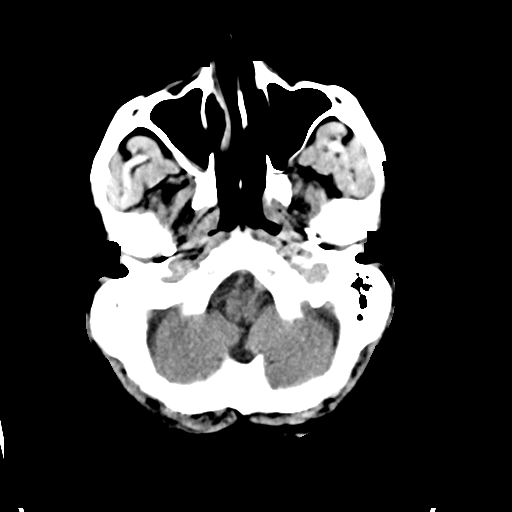
[im 4/32  bone]
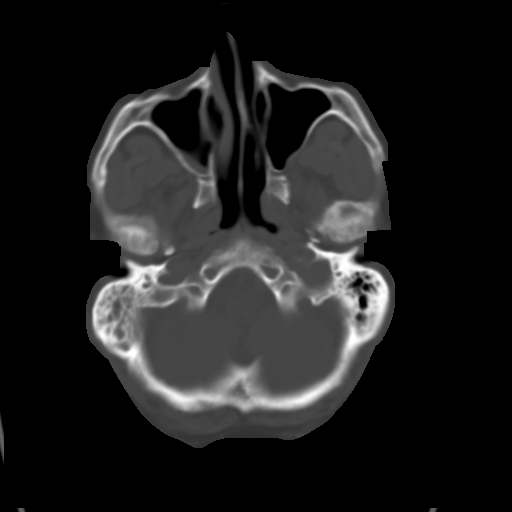
[im 8/32  brain]
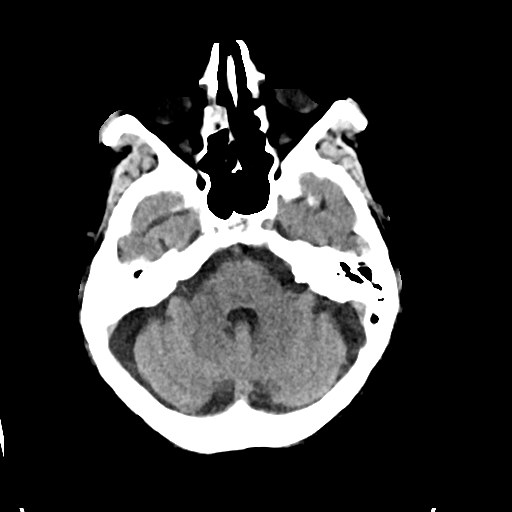
[im 12/32  brain]
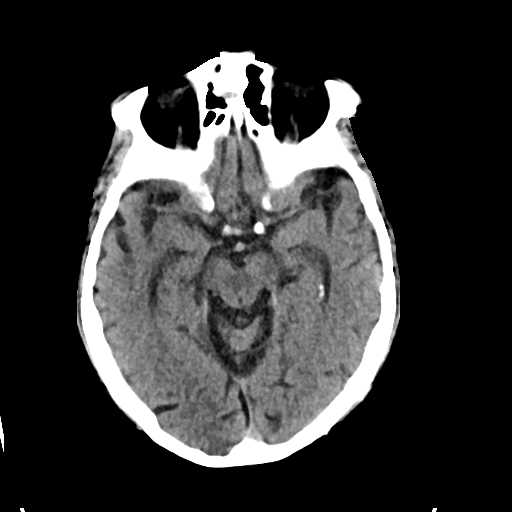
[im 16/32  brain]
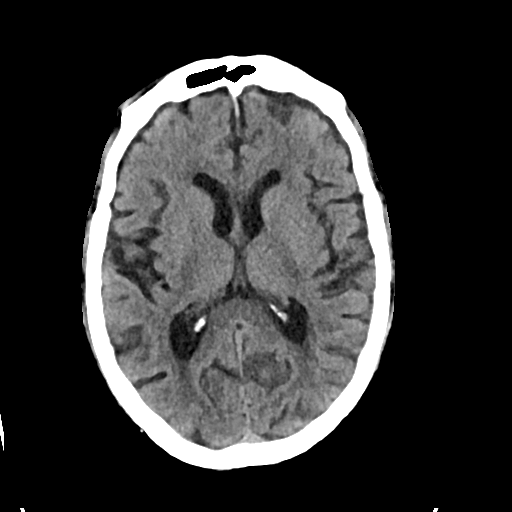
[im 20/32  brain]
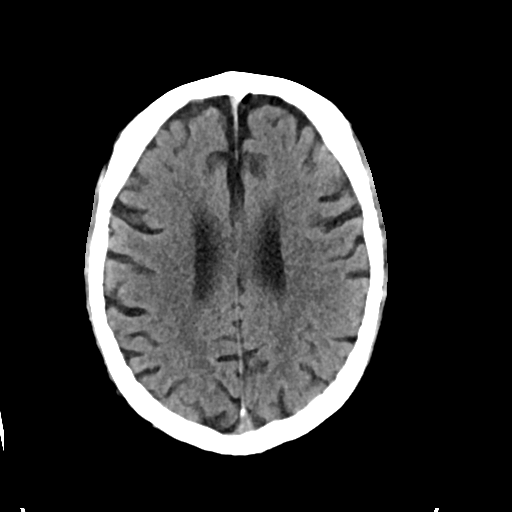
[im 20/32  bone]
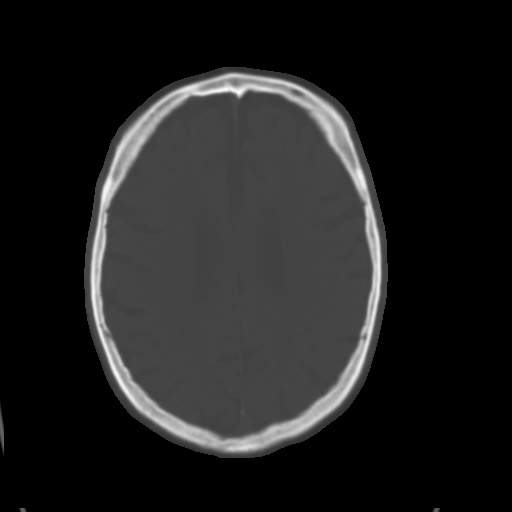
[im 24/32  brain]
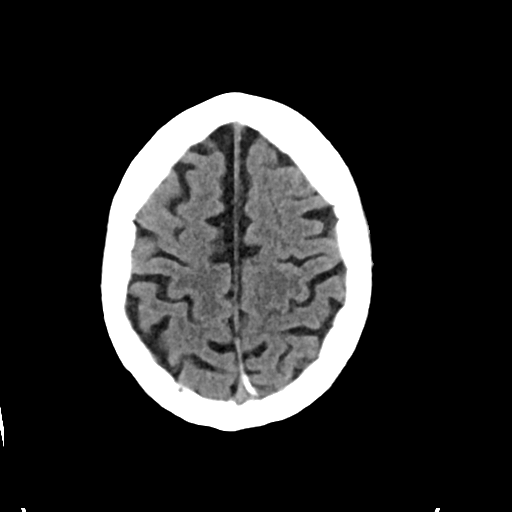
[im 28/32  brain]
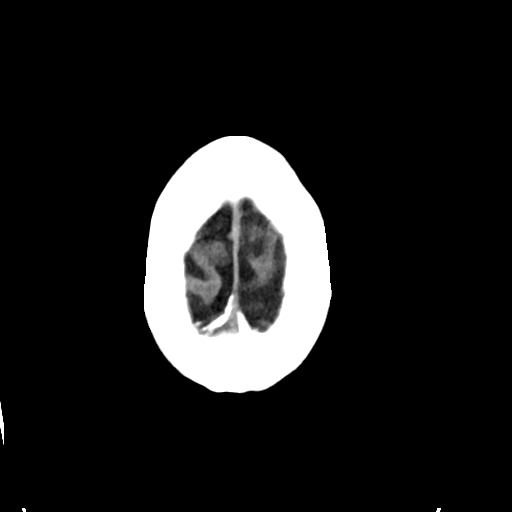

[Series 5: cor soft · coronal · 0.33mm/px · 3 of 67 slices shown]
[im 23/67  brain]
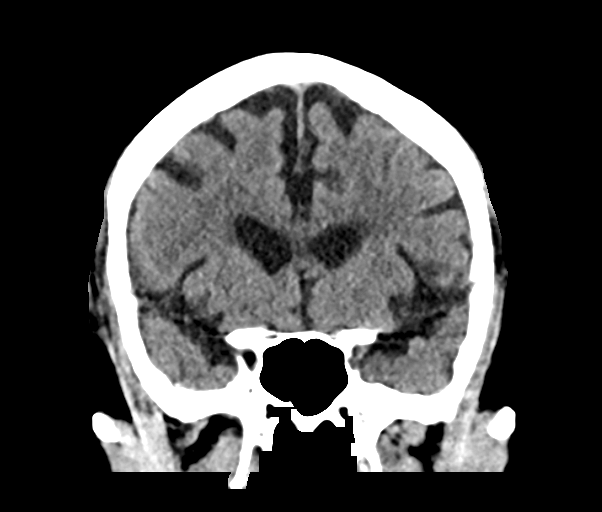
[im 30/67  brain]
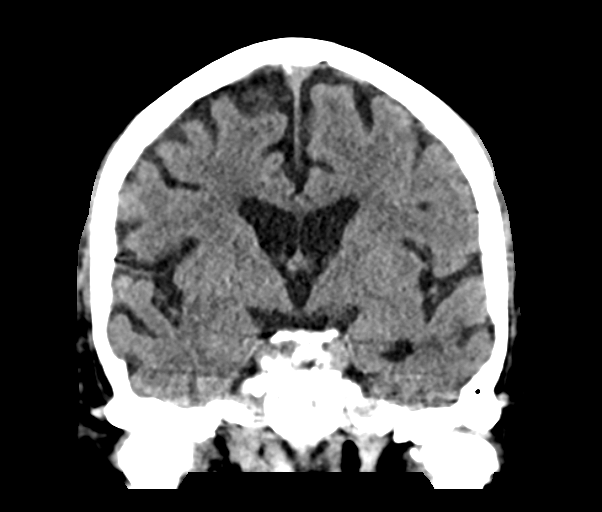
[im 37/67  brain]
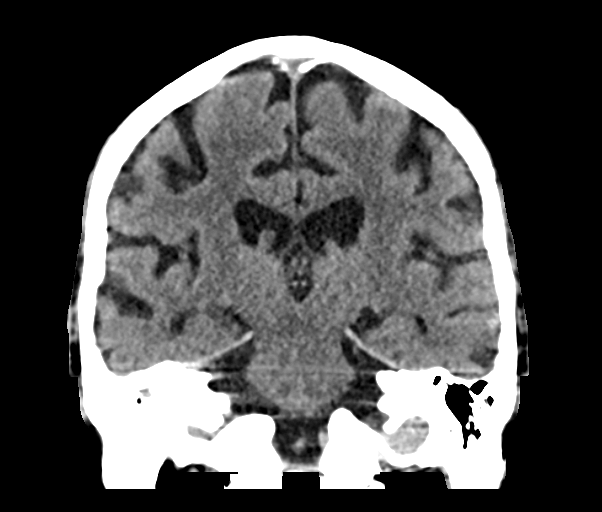

[Series 6: sag soft · sagittal · 0.34mm/px · 3 of 52 slices shown]
[im 18/52  brain]
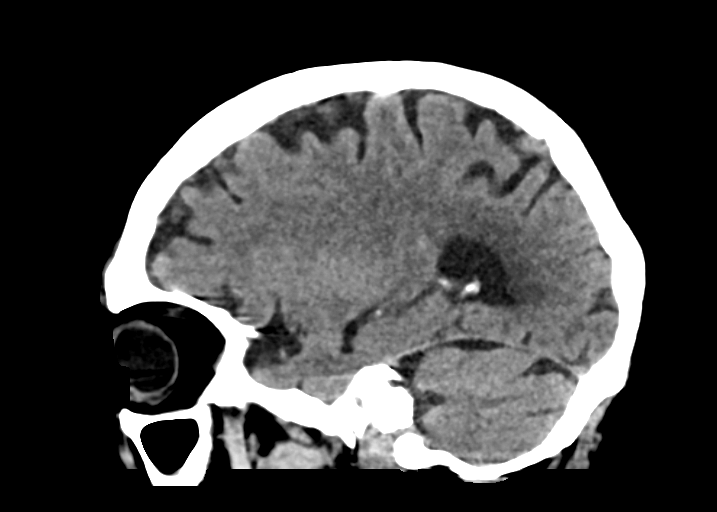
[im 26/52  brain]
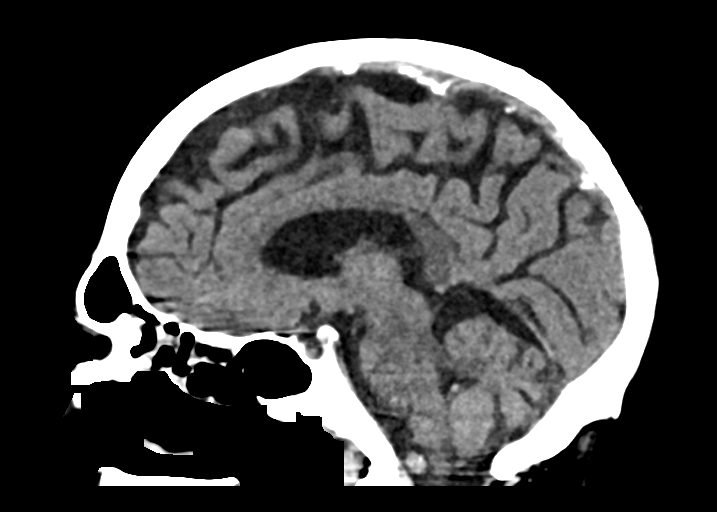
[im 35/52  brain]
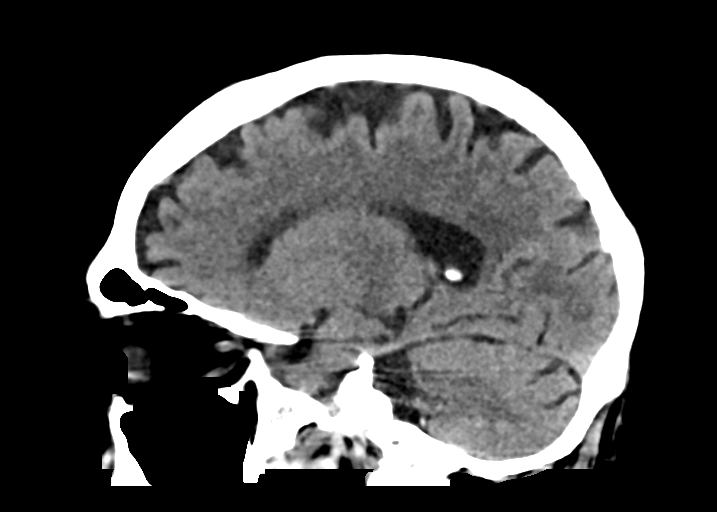

[15 of 47 positions shown; findings below may reference images not displayed]

FINDINGS: Brain: There is no evidence of acute infarct, intracranial
hemorrhage, mass, midline shift, or extra-axial fluid collection.
Mild cerebral atrophy is unchanged. Periventricular white matter
hypodensities are also unchanged and nonspecific but compatible with
mild chronic small vessel ischemic disease.

Vascular: Calcified atherosclerosis at the skull base. No hyperdense
vessel.

Skull: No fracture or focal osseous lesion.

Sinuses/Orbits: Improved paranasal sinus aeration. Moderate residual
right ethmoid and right maxillary sinus mucosal thickening. Large
right mastoid effusion, mildly increased from prior. Resolved right
middle ear opacification. At most trace fluid at the left mastoid
tip.

Other: Small right frontotemporal scalp hematoma.
IMPRESSION: 1. No evidence of acute intracranial abnormality.
2. Small right frontotemporal scalp hematoma.
3. Mild chronic small vessel ischemic disease.
4. Persistent large right mastoid effusion. Resolved right middle
ear effusion.
5. Improved paranasal sinus aeration.

## 2019-01-28 IMAGING — RF DG SWALLOWING FUNCTION - NRPT [facility]
1 series · 18 of 24 positions shown · non-contrast
Comparison: none

[Series 1: run · 32 acquisitions, 18 frames shown]
[im 1/32]
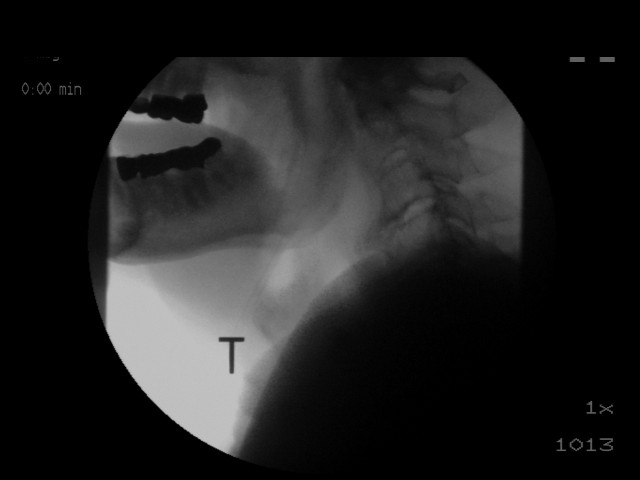
[im 3/32]
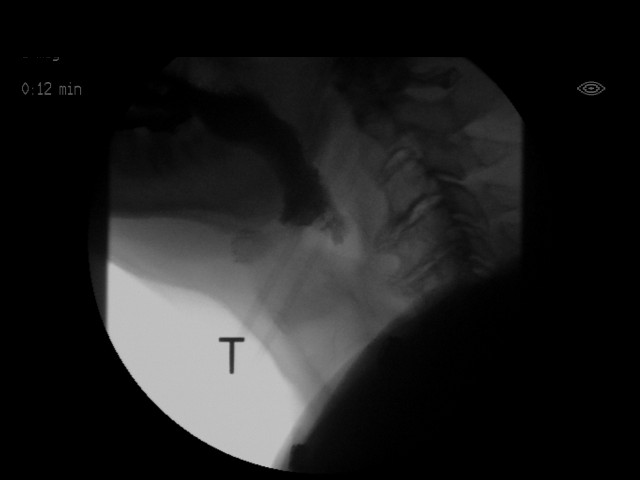
[im 5/32]
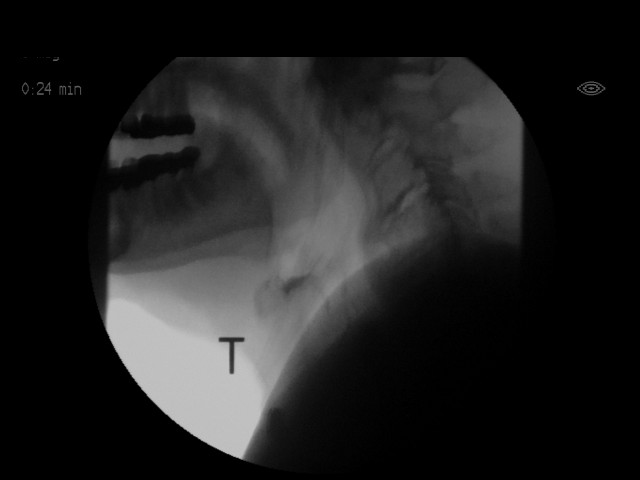
[im 6/32]
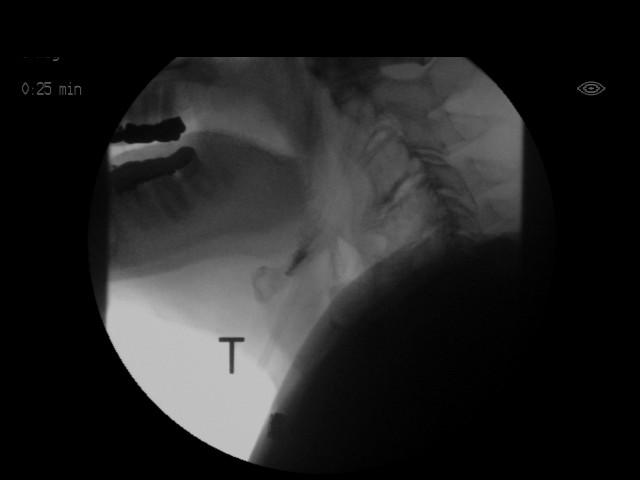
[im 9/32]
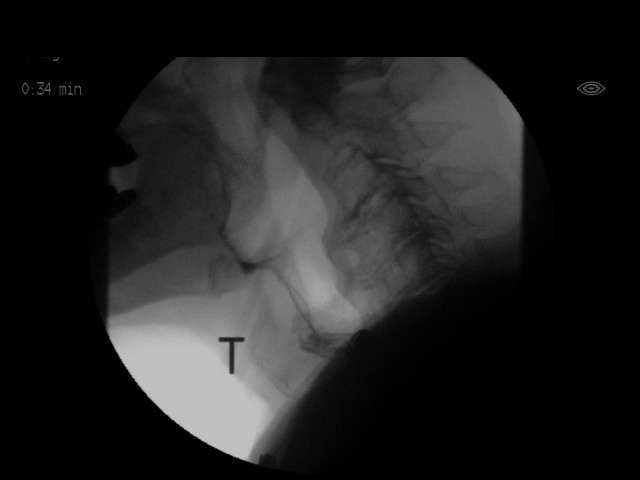
[im 10/32]
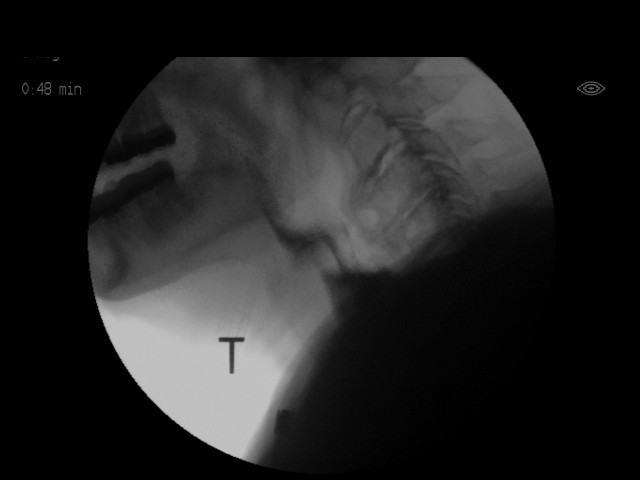
[im 11/32]
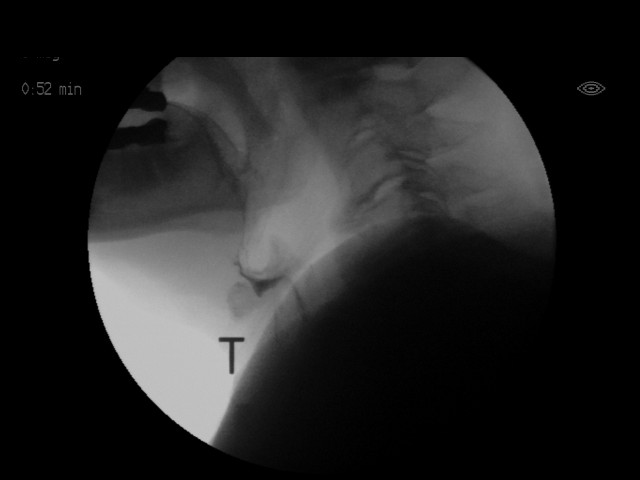
[im 14/32]
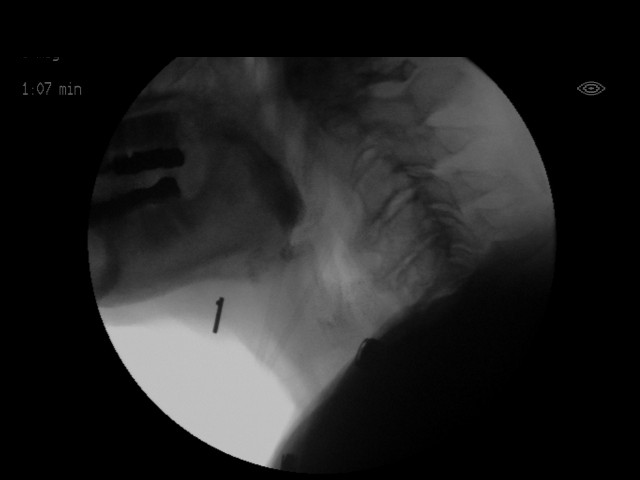
[im 15/32]
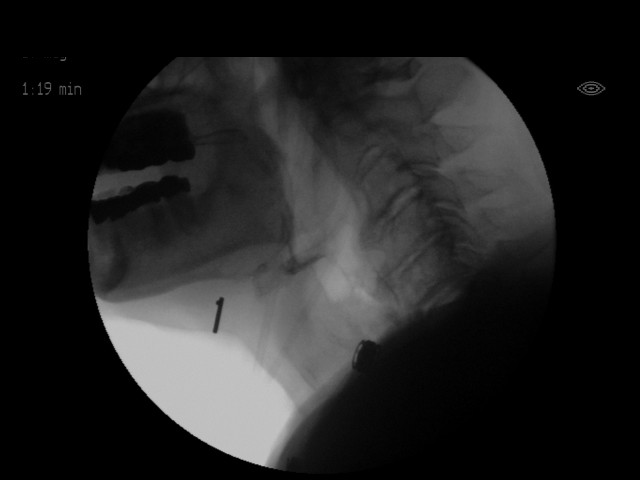
[im 17/32]
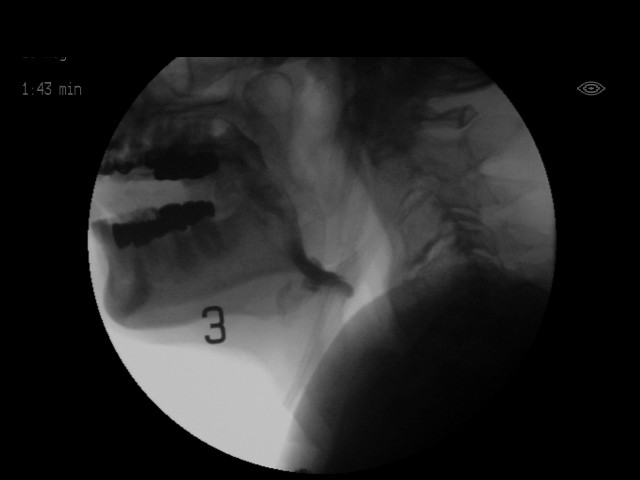
[im 19/32]
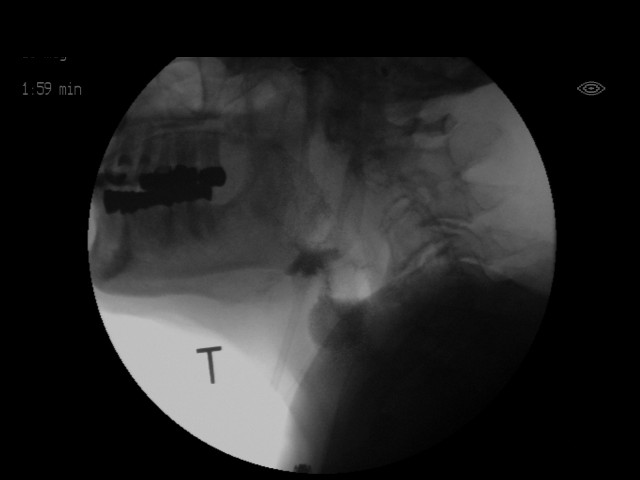
[im 21/32]
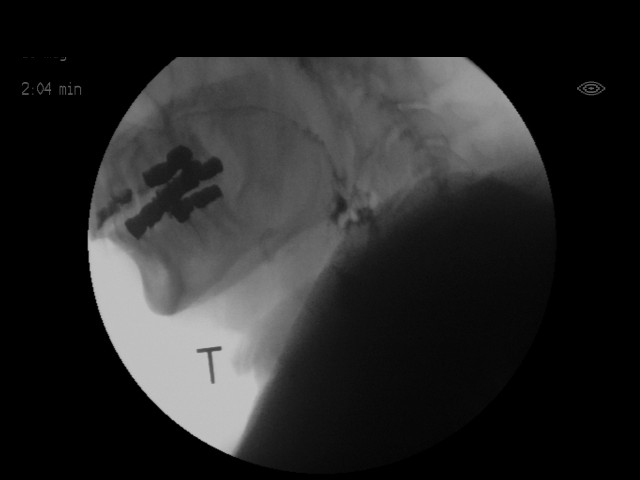
[im 22/32]
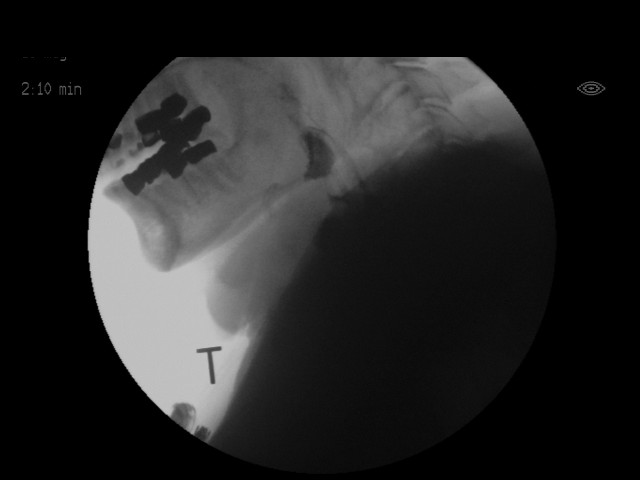
[im 25/32]
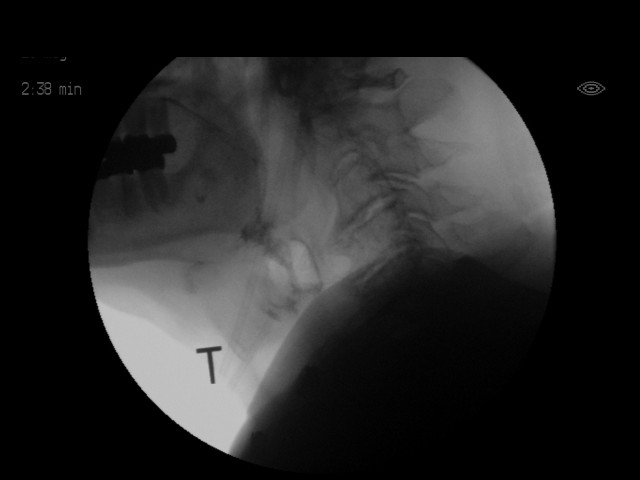
[im 26/32]
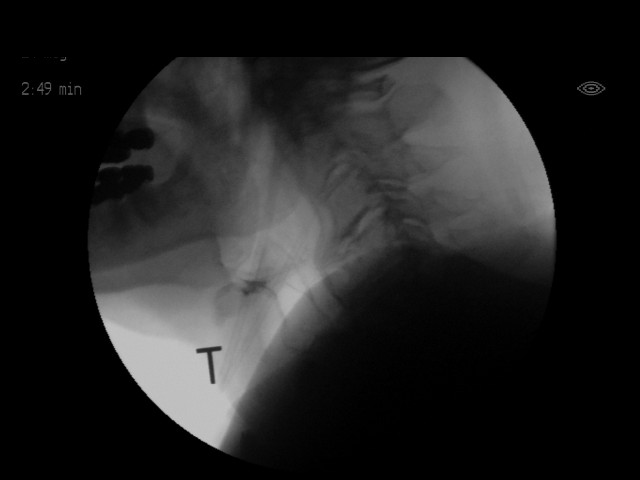
[im 27/32]
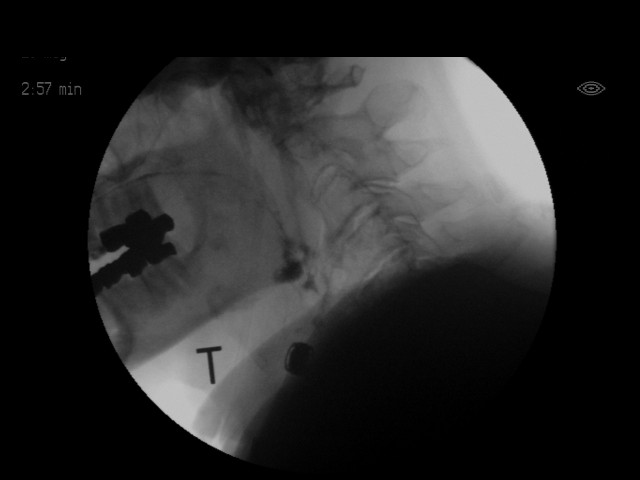
[im 30/32]
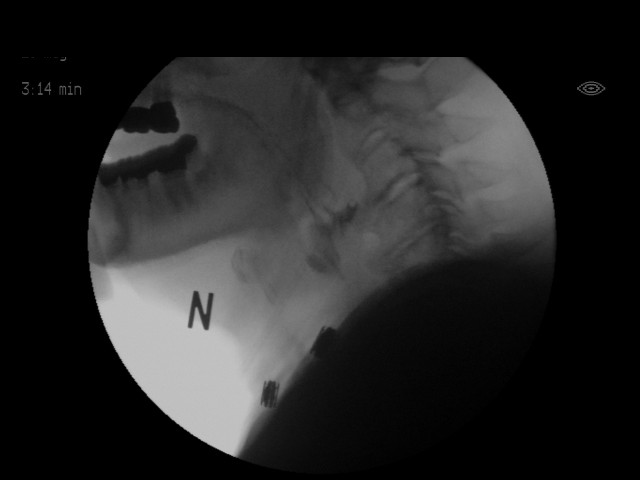
[im 32/32]
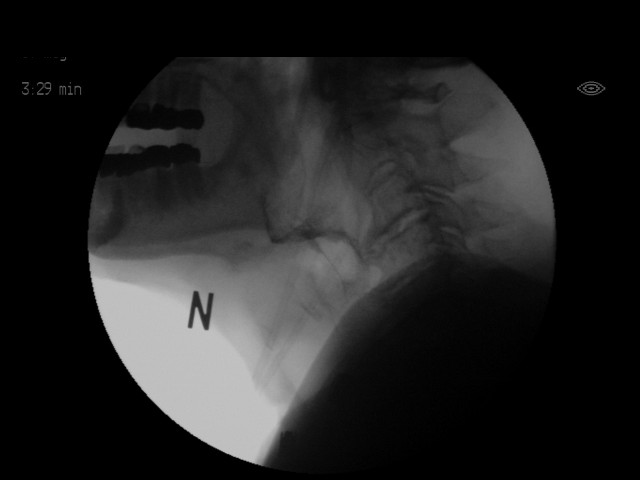

[18 of 24 positions shown; findings below may reference images not displayed]

FLUOROSCOPY FOR SWALLOWING FUNCTION STUDY:
Fluoroscopy was provided for swallowing function study, which was administered by a speech pathologist.  Final results and recommendations from this study are contained within the speech pathology report.

## 2019-01-30 ENCOUNTER — Other Ambulatory Visit: Payer: Self-pay

## 2019-01-30 ENCOUNTER — Ambulatory Visit (INDEPENDENT_AMBULATORY_CARE_PROVIDER_SITE_OTHER): Payer: Medicare Other | Admitting: Pulmonary Disease

## 2019-01-30 ENCOUNTER — Encounter: Payer: Self-pay | Admitting: Pulmonary Disease

## 2019-01-30 VITALS — BP 112/68 | HR 71 | Temp 98.0°F | Ht 70.0 in | Wt 206.8 lb

## 2019-01-30 DIAGNOSIS — J45991 Cough variant asthma: Secondary | ICD-10-CM | POA: Diagnosis not present

## 2019-01-30 MED ORDER — GABAPENTIN 300 MG PO CAPS: 300.0000 mg | ORAL_CAPSULE | Freq: Three times a day (TID) | ORAL | 1 refills | Status: DC

## 2019-01-30 NOTE — Patient Instructions (Signed)
Protracted cough  Will increase Neurontin to 3 times a day Sleep with head of the bed elevated Allow enough time for your stomach to empty before going to bed  We will see you back in the office in 3 months

## 2019-01-30 NOTE — Progress Notes (Signed)
_0  ID: Oscar Baker, male    DOB: Aug 15, 1932, 85 y.o.   MRN: [id number]   Chief complaint: Patient with chronic cough Cough related to aspiration  Referring provider: [name], MD  HPI: 85 yo male former smoker (mainly pipe smoker )  followed for chronic cough  and sinus disease  last seen in office 2016 . Seen by PCCM during hospitalization 11/2017 with Bacteremia and Empyema .  PMH significant for CHF /CM , A Fib , Pacemaker ,CLL  01/30/2019  Protracted cough Cough is a little bit better with Neurontin Over-the-counter medications does help some He was started on Neurontin during the last visit-slight improvement  Has no fevers or chills  Significant history:  Patient had a complicated recent medical history.  Patient fell earlier in 2019.  He had a left lower lobe pneumonia.  He required placement in a skilled nursing facility.  He was treated with antibiotics.  But developed a progressive left pleural effusion which was quite extensive into the entire left pleural space.  Patient was seen by thoracic surgery and underwent thoracoscopy for empyema .patient had postop complications and required reexploration on June 11 to remove clots in the pleural space.  A left VATS with decortication.  Found to have H influenza bacteremia felt from a pulmonary source.  Was treated with aggressive IV antibiotics. Since discharge patient is feeling better, getting stronger, is now back home. Using walker.  Has finished PT at home , going to go to OP PT.   Coughing up clear phlegm mostly Patient unable to tolerate aspiration diet-does not follow this Recommended full dysphagia 3 diet with nectar thick liquids  He does not cough as much when he is sitting in the recliner   Allergies  Allergen Reactions  . Altace [Ramipril] Other (See Comments)    "throat felt like had a knot in it"  . Augmentin [Amoxicillin-Pot Clavulanate] Diarrhea    severe  . Codeine Nausea And Vomiting   Nausea and vomiting   . Simvastatin Other (See Comments)    Leg aches    Immunization History  Administered Date(s) Administered  . Tdap 07/19/2017    Past Medical History:  Diagnosis Date  . Asthma 1950's   history of  . Atrial fibrillation ([place])   . Bilateral carpal tunnel syndrome   . Bilateral lower extremity edema   . Bladder tumor   . Chronic systolic heart failure (HCC)    Echo 1/19: Mild LVH, EF 45-50, inf HK, MAC, severe LAE, severe RAE // Echo 7/15: Mild LVH, mod focal basal sept hypertrophy, EF 55-60, AV peak and mean 16/9, trivial MR, mod LAE, PASP 38  . CLL (chronic lymphocytic leukemia) [facility]) oncologist-  dr Ilene Qua--   dx [phone] ;  Lymphocytosis, CLL - per lov note 05-11-2017 currently under active survillance,  CT 04-17-2014 show very small lymphadenopathy, no indication for treatment  . Coronary artery disease    cardiologist-  dr Cathie Olden--  08-18-2017 Intermittant risk nuclear study w/ large area of inferior infartion with no evidence ishcemia   . 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 Diabetes mellitus type 2, noninsulin dependent ([place])   . Elevated PSA    since prostatectomy but now resolved  . Hematuria 04/2017  . History of ear infection    Right  . History of MI (myocardial infarction)    per myoview nuclear study 08-18-2017 , unknown when  . History of shingles 08/2017   L ear and scalp, possible  .  Hyperlipidemia   . Hypertension   . Ischemic cardiomyopathy 09/01/2017   Presumed +CAD with Nuclear stress test 08/18/17 - Inferior scar, no ischemia, intermediate risk // med management unless +angina or worse dyspnea  . OA (osteoarthritis)   . Pacemaker 02/08/2014   followed by dr g. taylor--  single chamber Biotronik due to SSS  . Permanent atrial fibrillation   . Pneumonia 2019   Left lung  . Prostate cancer [facility]) urologist-  dr Diona Fanti   dx 2004--  Gleason 8, PSA 10.45--  11-28-2002  s/p  radical prostatectomy;  recurrent w/ increasing PSA, started ADT  treatment  . RBBB (right bundle branch block)   . Sick sinus syndrome ([place])    a-Flutter with episodes of bradycardia; S/P Biotronik (serial number [id number]) 02-08-2014  . Urinary incontinence   . Wears hearing aid in right ear    receiver and transmitter    Tobacco History: Social History   Tobacco Use  Smoking Status Former Smoker  . Years: 25.00  . Types: Pipe, Cigars  . Quit date: 11/25/1975  . Years since quitting: 43.2  Smokeless Tobacco Never Used    Outpatient Medications Prior to Visit  Medication Sig Dispense Refill  . acetaminophen (TYLENOL) 500 MG tablet Take 2 tablets (1,000 mg total) by mouth every 6 (six) hours. 30 tablet 0  . atorvastatin (LIPITOR) 20 MG tablet TAKE ONE TABLET EACH DAY AT 6PM 90 tablet 3  . carvedilol (COREG) 3.125 MG tablet TAKE ONE TABLET TWICE DAILY 180 tablet 3  . fenofibrate micronized (LOFIBRA) 200 MG capsule TAKE ONE CAPSULE EACH DAY BEFORE BREAKFAST 90 capsule 2  . furosemide (LASIX) 20 MG tablet TAKE ONE TABLET DAILY AS NEEDED 90 tablet 3  . gabapentin (NEURONTIN) 300 MG capsule Take 1 capsule (300 mg total) by mouth 2 (two) times daily. 60 capsule 1  . glipiZIDE (GLUCOTROL XL) 2.5 MG 24 hr tablet Take 2.5 mg by mouth daily with breakfast.    . guaiFENesin (MUCINEX) 600 MG 12 hr tablet Take 600 mg by mouth as needed.     [name] HYDROcodone-homatropine (HYCODAN) 5-1.5 MG/5ML syrup Take 5 mLs by mouth every 6 (six) hours as needed for cough. 240 mL 0  . ipratropium (ATROVENT) 0.02 % nebulizer solution Take 2.5 mLs (0.5 mg total) by nebulization 3 (three) times daily. 225 mL 3  . metFORMIN (GLUCOPHAGE) 1000 MG tablet Take 1,000 mg by mouth 2 (two) times daily with a meal.      . Multiple Vitamin (MULTI-VITAMIN) tablet Take by mouth.    . Nebulizers (VIOS LC SPRINT DELUXE) MISC Use 1 each every 6 (six) hours as needed (shortness of breath or wheezing) Fax to [facility]    . sitaGLIPtin (JANUVIA) 100 MG tablet Take 100 mg by mouth daily.    . vitamin  B-12 (CYANOCOBALAMIN) 1000 MCG tablet Take 1,000 mcg by mouth daily. Will stop prior to procedure    . warfarin (COUMADIN) 5 MG tablet TAKE AS DIRECTED BY COUMADIN CLINIC 120 tablet 0  . Aspirin Buf,CaCarb-MgCarb-MgO, 81 MG TABS aspirin 81 mg tablet   81 mg by oral route.    . mupirocin ointment (BACTROBAN) 2 %     . sodium chloride HYPERTONIC 3 % nebulizer solution Take by nebulization 3 (three) times daily. (Patient not taking: Reported on 01/30/2019) 360 mL 3   No facility-administered medications prior to visit.    Review of Systems  Constitutional: Negative.   HENT: Negative.   Respiratory: Positive for cough and sputum  production. Negative for hemoptysis and shortness of breath.   Cardiovascular: Negative for chest pain.  Gastrointestinal: Negative.   Genitourinary: Positive for frequency and urgency.  Musculoskeletal: Negative.    Physical Exam  Constitutional: He is well-developed, well-nourished, and in no distress.  HENT:  Head: Normocephalic and atraumatic.  Eyes: Pupils are equal, round, and reactive to light. Conjunctivae and EOM are normal. Right eye exhibits no discharge. Left eye exhibits no discharge.  Neck: Normal range of motion. Neck supple. No tracheal deviation present. No thyromegaly present.  Cardiovascular: Normal rate and regular rhythm.  Pulmonary/Chest: Effort normal and breath sounds normal. No respiratory distress. He has no wheezes. He has no rales.    BP 112/68 (BP Location: Left Arm, Cuff Size: Normal)   Pulse 71   Temp 98 F (36.7 C) (Oral)   Ht _0  (1.778 m)   Wt 206 lb 12.8 oz (93.8 kg)   SpO2 97%   BMI 29.67 kg/m    Imaging: Most recent chest x-ray reviewed by myself  FeNo 18 06/21/18   Assessment & Plan:  .  Aspiration pneumonia, recently resolved  .  Chronic cough -Related to aspiration -Slight improvement with Neurontin -Not able to adhere to dysphagia diet  .  Normal chest x-ray showing left basal pleural thickening  .   Increasing frustration about his persistent cough -Adamant about not been able to bear to dysphagia diet -Continue lines of care -We will increase Neurontin to 3 times a day  He is aware that continuing aspirations will lead to recurrent infections and other complications  Plan: .  Continue aspiration precautions  .  Continue medications for cough  .  Encouraged to stay active  .  I will follow-up with him in about 3 months  .  Call with significant symptoms   Laurin Coder, MD 01/30/2019

## 2019-01-31 ENCOUNTER — Ambulatory Visit (INDEPENDENT_AMBULATORY_CARE_PROVIDER_SITE_OTHER): Payer: Medicare Other | Admitting: *Deleted

## 2019-01-31 DIAGNOSIS — I4821 Permanent atrial fibrillation: Secondary | ICD-10-CM | POA: Diagnosis not present

## 2019-01-31 DIAGNOSIS — I480 Paroxysmal atrial fibrillation: Secondary | ICD-10-CM

## 2019-01-31 DIAGNOSIS — Z5181 Encounter for therapeutic drug level monitoring: Secondary | ICD-10-CM | POA: Diagnosis not present

## 2019-01-31 LAB — POCT INR: INR: 2 (ref 2.0–3.0)

## 2019-01-31 IMAGING — US IR THORACENTESIS ASP PLEURAL SPACE W/IMG GUIDE
1 series · 2 of 2 positions shown · non-contrast
Comparison: none

INDICATION: Patient with left pleural effusion. Request is made for diagnostic
and therapeutic thoracentesis.

[Series 1: ir (id) (id)/(id)/(id) ir · 2 of 2 slices shown]
[im 1/2]
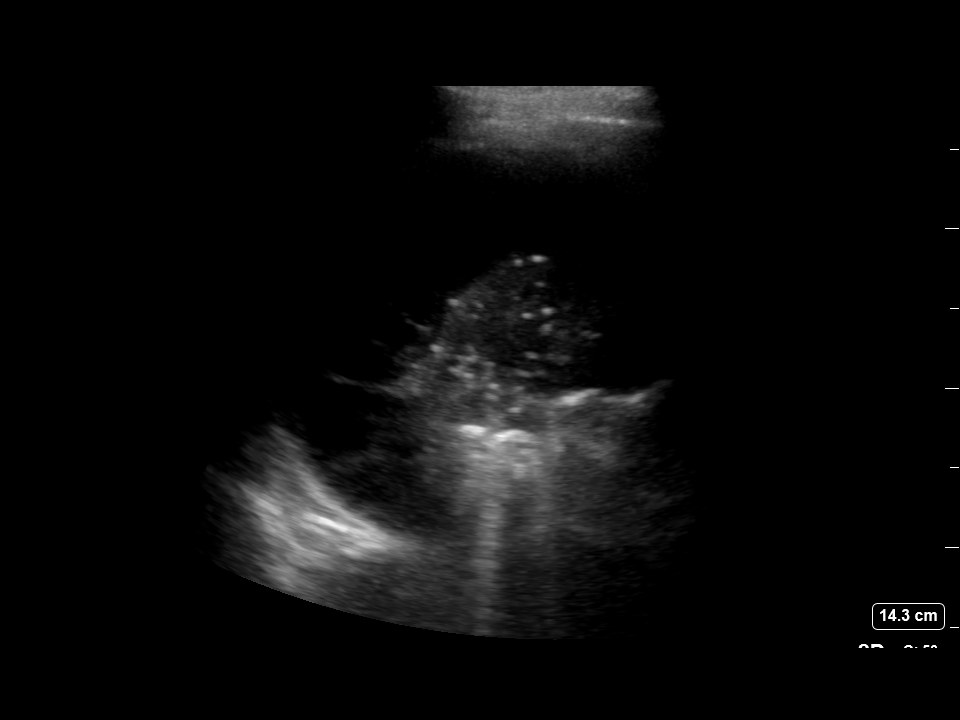
[im 2/2]
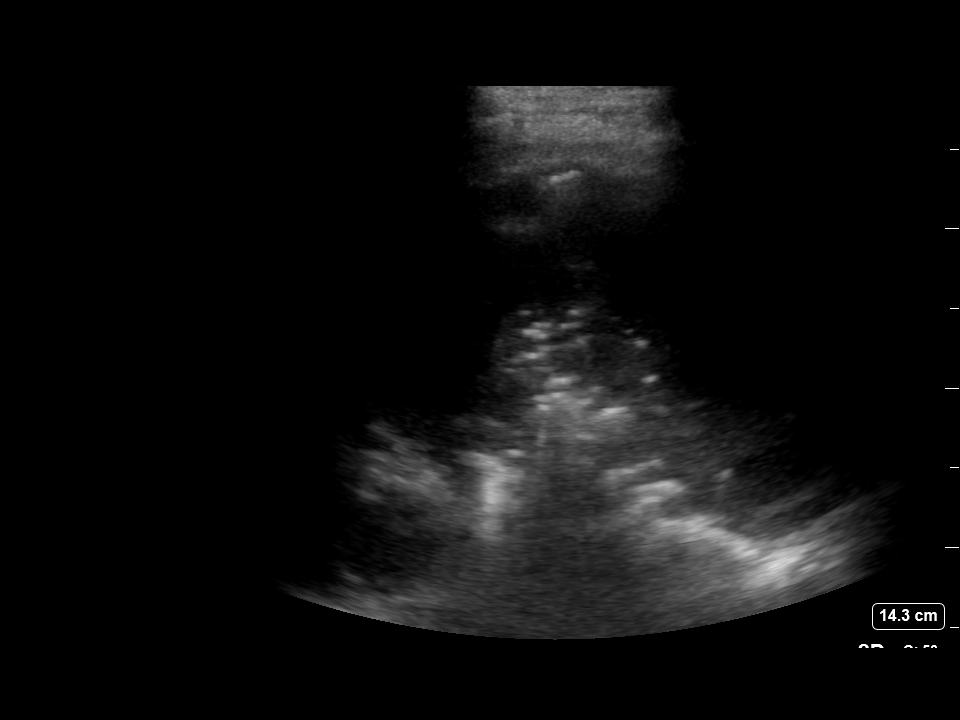

[2 of 2 positions shown; findings below may reference images not displayed]

EXAM:
ULTRASOUND GUIDED DIAGNOSTIC AND THERAPEUTIC LEFT THORACENTESIS

MEDICATIONS:
10 mL 2% lidocaine

COMPLICATIONS:
None immediate.

PROCEDURE:
An ultrasound guided thoracentesis was thoroughly discussed with the
patient and questions answered. The benefits, risks, alternatives
and complications were also discussed. The patient understands and
wishes to proceed with the procedure. Written consent was obtained.

Ultrasound was performed to localize and mark an adequate pocket of
fluid in the left chest. The area was then prepped and draped in the
normal sterile fashion. 2% lidocaine was used for local anesthesia.
Under ultrasound guidance a Safe-T-Centesis catheter was introduced.
Thoracentesis was performed. The catheter was removed and a dressing
applied.
FINDINGS: A total of approximately 70 mL of cloudy yellow fluid was removed.
Samples were sent to the laboratory as requested by the clinical
team.
IMPRESSION: Successful ultrasound guided diagnostic and therapeutic left
thoracentesis yielding 70 mL of pleural fluid.

## 2019-01-31 NOTE — Patient Instructions (Signed)
Description   Continue taking 1 tablet daily except 1.5 tablets on Tuesdays and Saturdays. Recheck in 3 weeks.  Call [facility] [phone] with any concerns.

## 2019-02-01 DIAGNOSIS — L570 Actinic keratosis: Secondary | ICD-10-CM | POA: Diagnosis not present

## 2019-02-01 DIAGNOSIS — L219 Seborrheic dermatitis, unspecified: Secondary | ICD-10-CM | POA: Diagnosis not present

## 2019-02-01 IMAGING — DX DG CHEST 1V PORT
1 series · 1 of 1 positions shown · non-contrast
Comparison: November 26, 2017

CLINICAL DATA: Pneumonia

EXAM:
PORTABLE CHEST 1 VIEW

[chest ap]
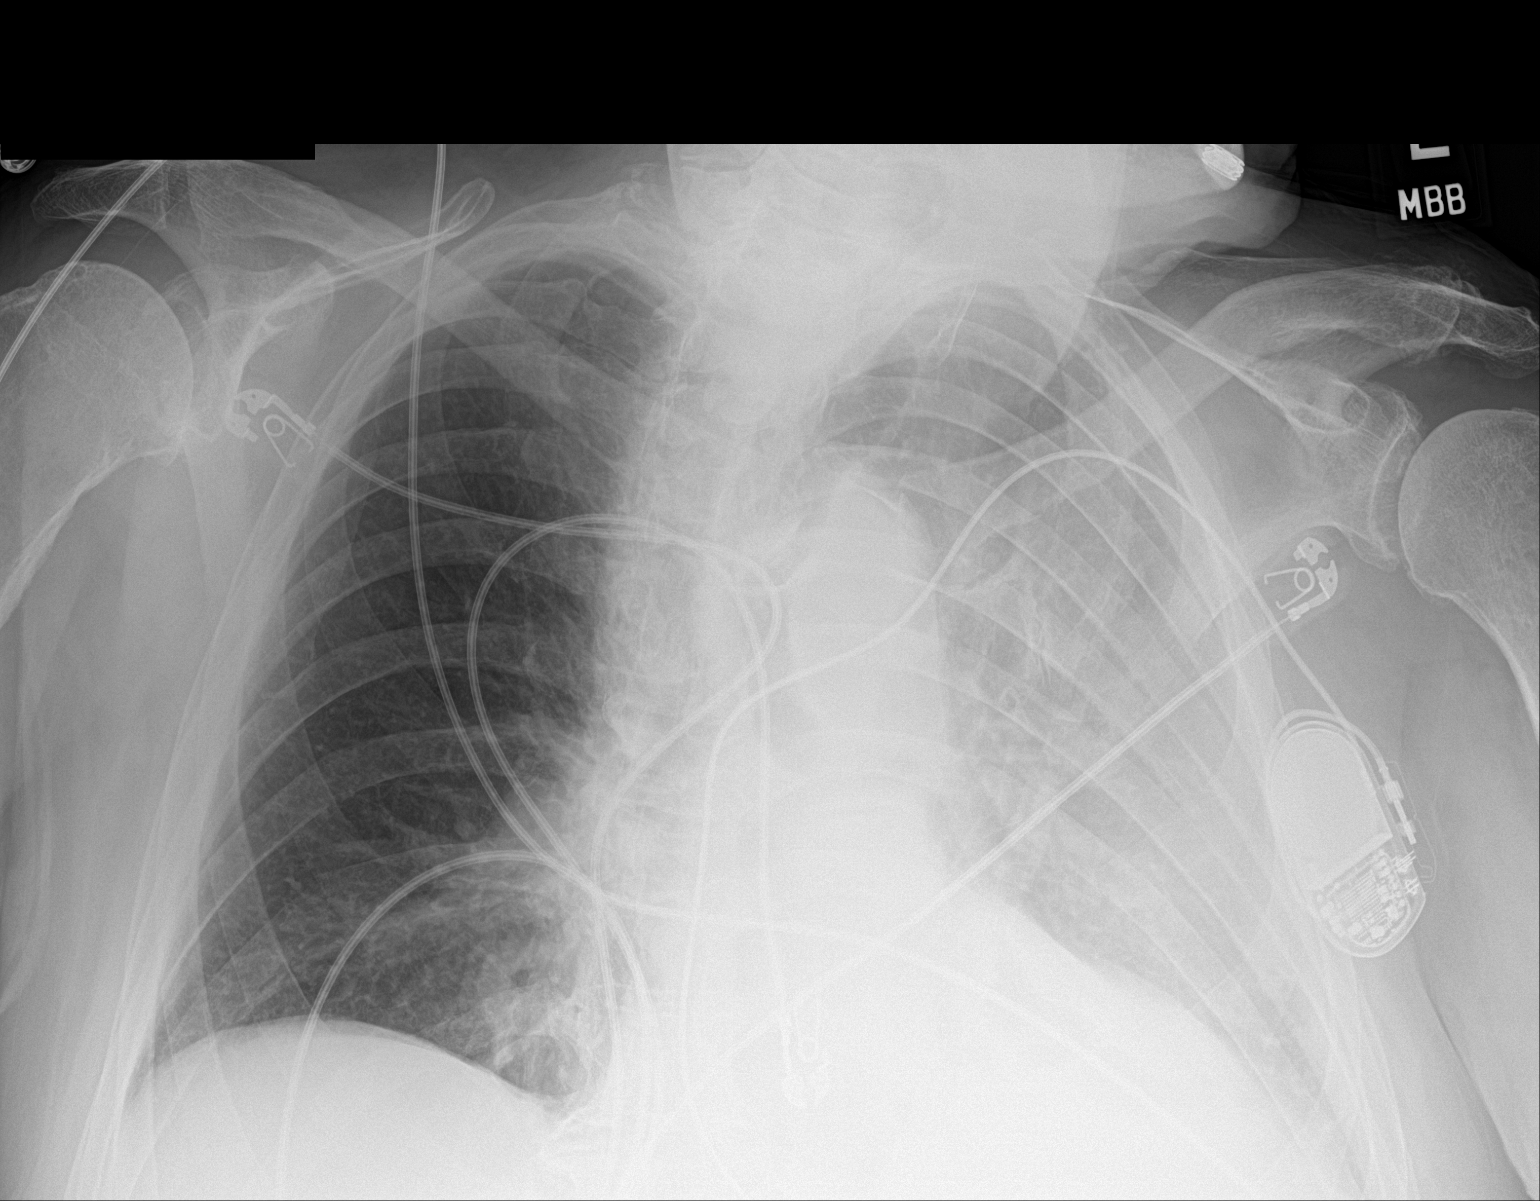

[1 of 1 positions shown; findings below may reference images not displayed]

FINDINGS: There is layering pleural effusion on the left. There is
consolidation in the left base. There is mild right base
atelectasis. Right lung otherwise is clear. Heart is mildly enlarged
with pulmonary vascularity normal. Pacemaker lead attached to right
ventricle. No pneumothorax. No adenopathy. No bone lesions.
IMPRESSION: Layering pleural effusion on the left with persistent consolidation
left base. Mild right base atelectasis. Stable cardiac prominence.

## 2019-02-20 ENCOUNTER — Other Ambulatory Visit: Payer: Self-pay | Admitting: Nurse Practitioner

## 2019-02-20 DIAGNOSIS — J45991 Cough variant asthma: Secondary | ICD-10-CM

## 2019-02-21 ENCOUNTER — Ambulatory Visit (INDEPENDENT_AMBULATORY_CARE_PROVIDER_SITE_OTHER): Payer: Medicare Other | Admitting: *Deleted

## 2019-02-21 ENCOUNTER — Other Ambulatory Visit: Payer: Self-pay

## 2019-02-21 DIAGNOSIS — Z5181 Encounter for therapeutic drug level monitoring: Secondary | ICD-10-CM | POA: Diagnosis not present

## 2019-02-21 DIAGNOSIS — I4821 Permanent atrial fibrillation: Secondary | ICD-10-CM

## 2019-02-21 DIAGNOSIS — I480 Paroxysmal atrial fibrillation: Secondary | ICD-10-CM

## 2019-02-21 LAB — POCT INR: INR: 2.6 (ref 2.0–3.0)

## 2019-02-21 NOTE — Patient Instructions (Signed)
Description   Continue taking 1 tablet daily except 1.5 tablets on Tuesdays and Saturdays. Recheck in 4 weeks.  Call [facility] [phone] with any concerns.

## 2019-03-07 ENCOUNTER — Ambulatory Visit
Admission: RE | Admit: 2019-03-07 | Discharge: 2019-03-07 | Disposition: A | Discharge status: Home / Self Care | Payer: Medicare Other | Source: Ambulatory Visit | Attending: Urology | Admitting: Urology

## 2019-03-07 ENCOUNTER — Other Ambulatory Visit: Payer: Self-pay

## 2019-03-07 DIAGNOSIS — M85832 Other specified disorders of bone density and structure, left forearm: Secondary | ICD-10-CM | POA: Diagnosis not present

## 2019-03-07 DIAGNOSIS — Z79899 Other long term (current) drug therapy: Secondary | ICD-10-CM

## 2019-03-09 ENCOUNTER — Other Ambulatory Visit: Payer: Self-pay

## 2019-03-09 ENCOUNTER — Inpatient Hospital Stay (HOSPITAL_COMMUNITY)
Admission: EM | Admit: 2019-03-09 | Discharge: 2019-03-20 | DRG: 481 | Disposition: A | Discharge status: Rehabilitation Facility | Payer: Medicare Other | Attending: Internal Medicine | Admitting: Internal Medicine

## 2019-03-09 ENCOUNTER — Encounter (HOSPITAL_COMMUNITY): Payer: Self-pay | Admitting: Emergency Medicine

## 2019-03-09 DIAGNOSIS — M9701XA Periprosthetic fracture around internal prosthetic right hip joint, initial encounter: Secondary | ICD-10-CM | POA: Diagnosis present

## 2019-03-09 DIAGNOSIS — I110 Hypertensive heart disease with heart failure: Secondary | ICD-10-CM | POA: Diagnosis not present

## 2019-03-09 DIAGNOSIS — R0902 Hypoxemia: Secondary | ICD-10-CM | POA: Diagnosis not present

## 2019-03-09 DIAGNOSIS — I4892 Unspecified atrial flutter: Secondary | ICD-10-CM | POA: Diagnosis present

## 2019-03-09 DIAGNOSIS — Z66 Do not resuscitate: Secondary | ICD-10-CM | POA: Diagnosis present

## 2019-03-09 DIAGNOSIS — K5900 Constipation, unspecified: Secondary | ICD-10-CM | POA: Diagnosis not present

## 2019-03-09 DIAGNOSIS — Z79891 Long term (current) use of opiate analgesic: Secondary | ICD-10-CM

## 2019-03-09 DIAGNOSIS — S72001A Fracture of unspecified part of neck of right femur, initial encounter for closed fracture: Principal | ICD-10-CM | POA: Diagnosis present

## 2019-03-09 DIAGNOSIS — R52 Pain, unspecified: Secondary | ICD-10-CM | POA: Diagnosis not present

## 2019-03-09 DIAGNOSIS — Z8546 Personal history of malignant neoplasm of prostate: Secondary | ICD-10-CM

## 2019-03-09 DIAGNOSIS — Z96643 Presence of artificial hip joint, bilateral: Secondary | ICD-10-CM | POA: Diagnosis present

## 2019-03-09 DIAGNOSIS — Z9079 Acquired absence of other genital organ(s): Secondary | ICD-10-CM

## 2019-03-09 DIAGNOSIS — R079 Chest pain, unspecified: Secondary | ICD-10-CM | POA: Diagnosis not present

## 2019-03-09 DIAGNOSIS — M25551 Pain in right hip: Secondary | ICD-10-CM | POA: Diagnosis not present

## 2019-03-09 DIAGNOSIS — S72324A Nondisplaced transverse fracture of shaft of right femur, initial encounter for closed fracture: Secondary | ICD-10-CM

## 2019-03-09 DIAGNOSIS — H9191 Unspecified hearing loss, right ear: Secondary | ICD-10-CM | POA: Diagnosis present

## 2019-03-09 DIAGNOSIS — Z7901 Long term (current) use of anticoagulants: Secondary | ICD-10-CM

## 2019-03-09 DIAGNOSIS — R609 Edema, unspecified: Secondary | ICD-10-CM | POA: Diagnosis not present

## 2019-03-09 DIAGNOSIS — I480 Paroxysmal atrial fibrillation: Secondary | ICD-10-CM | POA: Diagnosis present

## 2019-03-09 DIAGNOSIS — T45515A Adverse effect of anticoagulants, initial encounter: Secondary | ICD-10-CM | POA: Diagnosis present

## 2019-03-09 DIAGNOSIS — Z20828 Contact with and (suspected) exposure to other viral communicable diseases: Secondary | ICD-10-CM | POA: Diagnosis present

## 2019-03-09 DIAGNOSIS — Z881 Allergy status to other antibiotic agents status: Secondary | ICD-10-CM

## 2019-03-09 DIAGNOSIS — S7291XA Unspecified fracture of right femur, initial encounter for closed fracture: Secondary | ICD-10-CM

## 2019-03-09 DIAGNOSIS — Y92009 Unspecified place in unspecified non-institutional (private) residence as the place of occurrence of the external cause: Secondary | ICD-10-CM

## 2019-03-09 DIAGNOSIS — W19XXXA Unspecified fall, initial encounter: Secondary | ICD-10-CM | POA: Diagnosis not present

## 2019-03-09 DIAGNOSIS — Z8249 Family history of ischemic heart disease and other diseases of the circulatory system: Secondary | ICD-10-CM

## 2019-03-09 DIAGNOSIS — K219 Gastro-esophageal reflux disease without esophagitis: Secondary | ICD-10-CM | POA: Diagnosis present

## 2019-03-09 DIAGNOSIS — I4821 Permanent atrial fibrillation: Secondary | ICD-10-CM | POA: Diagnosis present

## 2019-03-09 DIAGNOSIS — R296 Repeated falls: Secondary | ICD-10-CM | POA: Diagnosis present

## 2019-03-09 DIAGNOSIS — D630 Anemia in neoplastic disease: Secondary | ICD-10-CM | POA: Diagnosis present

## 2019-03-09 DIAGNOSIS — S3991XA Unspecified injury of abdomen, initial encounter: Secondary | ICD-10-CM | POA: Diagnosis not present

## 2019-03-09 DIAGNOSIS — C9111 Chronic lymphocytic leukemia of B-cell type in remission: Secondary | ICD-10-CM | POA: Diagnosis present

## 2019-03-09 DIAGNOSIS — S20229A Contusion of unspecified back wall of thorax, initial encounter: Secondary | ICD-10-CM | POA: Diagnosis present

## 2019-03-09 DIAGNOSIS — Z79899 Other long term (current) drug therapy: Secondary | ICD-10-CM

## 2019-03-09 DIAGNOSIS — Z7982 Long term (current) use of aspirin: Secondary | ICD-10-CM

## 2019-03-09 DIAGNOSIS — I495 Sick sinus syndrome: Secondary | ICD-10-CM | POA: Diagnosis present

## 2019-03-09 DIAGNOSIS — Z888 Allergy status to other drugs, medicaments and biological substances status: Secondary | ICD-10-CM

## 2019-03-09 DIAGNOSIS — Z950 Presence of cardiac pacemaker: Secondary | ICD-10-CM

## 2019-03-09 DIAGNOSIS — I252 Old myocardial infarction: Secondary | ICD-10-CM

## 2019-03-09 DIAGNOSIS — Z03818 Encounter for observation for suspected exposure to other biological agents ruled out: Secondary | ICD-10-CM | POA: Diagnosis not present

## 2019-03-09 DIAGNOSIS — D689 Coagulation defect, unspecified: Secondary | ICD-10-CM

## 2019-03-09 DIAGNOSIS — Z974 Presence of external hearing-aid: Secondary | ICD-10-CM

## 2019-03-09 DIAGNOSIS — J45909 Unspecified asthma, uncomplicated: Secondary | ICD-10-CM | POA: Diagnosis present

## 2019-03-09 DIAGNOSIS — I255 Ischemic cardiomyopathy: Secondary | ICD-10-CM | POA: Diagnosis present

## 2019-03-09 DIAGNOSIS — Z7984 Long term (current) use of oral hypoglycemic drugs: Secondary | ICD-10-CM

## 2019-03-09 DIAGNOSIS — C9110 Chronic lymphocytic leukemia of B-cell type not having achieved remission: Secondary | ICD-10-CM | POA: Diagnosis present

## 2019-03-09 DIAGNOSIS — Z538 Procedure and treatment not carried out for other reasons: Secondary | ICD-10-CM | POA: Diagnosis not present

## 2019-03-09 DIAGNOSIS — E119 Type 2 diabetes mellitus without complications: Secondary | ICD-10-CM

## 2019-03-09 DIAGNOSIS — I10 Essential (primary) hypertension: Secondary | ICD-10-CM | POA: Diagnosis not present

## 2019-03-09 DIAGNOSIS — E785 Hyperlipidemia, unspecified: Secondary | ICD-10-CM | POA: Diagnosis present

## 2019-03-09 DIAGNOSIS — Z825 Family history of asthma and other chronic lower respiratory diseases: Secondary | ICD-10-CM

## 2019-03-09 DIAGNOSIS — I5042 Chronic combined systolic (congestive) and diastolic (congestive) heart failure: Secondary | ICD-10-CM | POA: Diagnosis present

## 2019-03-09 DIAGNOSIS — Z885 Allergy status to narcotic agent status: Secondary | ICD-10-CM

## 2019-03-09 DIAGNOSIS — E1165 Type 2 diabetes mellitus with hyperglycemia: Secondary | ICD-10-CM | POA: Diagnosis not present

## 2019-03-09 DIAGNOSIS — Z87891 Personal history of nicotine dependence: Secondary | ICD-10-CM

## 2019-03-09 DIAGNOSIS — I2510 Atherosclerotic heart disease of native coronary artery without angina pectoris: Secondary | ICD-10-CM | POA: Diagnosis present

## 2019-03-09 MED ORDER — FENTANYL CITRATE (PF) 100 MCG/2ML IJ SOLN
50.0000 ug | INTRAMUSCULAR | Status: DC | PRN
  Administered 2019-03-10: 50 ug via INTRAVENOUS
  Filled 2019-03-09: qty 2

## 2019-03-09 NOTE — ED Triage Notes (Addendum)
  Patient BIB EMS after mechanical fall at home.  Patient was using his walker and got caught up on the carpet and fell on his R hip with legs crossed.  Patient did not hit his head but is on blood thinners.  Patient states his hip dislocated but he popped it back in.  Patient was ambulatory at the scene and states the pain is minimal.  Patient has abrasion to R forearm and elbow.  Patient A&O x4.

## 2019-03-10 ENCOUNTER — Emergency Department (HOSPITAL_COMMUNITY): Payer: Medicare Other

## 2019-03-10 ENCOUNTER — Encounter (HOSPITAL_COMMUNITY): Payer: Self-pay | Admitting: Internal Medicine

## 2019-03-10 DIAGNOSIS — I255 Ischemic cardiomyopathy: Secondary | ICD-10-CM | POA: Diagnosis present

## 2019-03-10 DIAGNOSIS — R001 Bradycardia, unspecified: Secondary | ICD-10-CM | POA: Diagnosis not present

## 2019-03-10 DIAGNOSIS — T402X5A Adverse effect of other opioids, initial encounter: Secondary | ICD-10-CM | POA: Diagnosis present

## 2019-03-10 DIAGNOSIS — E119 Type 2 diabetes mellitus without complications: Secondary | ICD-10-CM

## 2019-03-10 DIAGNOSIS — I495 Sick sinus syndrome: Secondary | ICD-10-CM | POA: Diagnosis present

## 2019-03-10 DIAGNOSIS — I2510 Atherosclerotic heart disease of native coronary artery without angina pectoris: Secondary | ICD-10-CM | POA: Diagnosis present

## 2019-03-10 DIAGNOSIS — I252 Old myocardial infarction: Secondary | ICD-10-CM | POA: Diagnosis not present

## 2019-03-10 DIAGNOSIS — I110 Hypertensive heart disease with heart failure: Secondary | ICD-10-CM | POA: Diagnosis present

## 2019-03-10 DIAGNOSIS — Z7901 Long term (current) use of anticoagulants: Secondary | ICD-10-CM | POA: Diagnosis not present

## 2019-03-10 DIAGNOSIS — I4892 Unspecified atrial flutter: Secondary | ICD-10-CM | POA: Diagnosis present

## 2019-03-10 DIAGNOSIS — I5042 Chronic combined systolic (congestive) and diastolic (congestive) heart failure: Secondary | ICD-10-CM | POA: Diagnosis present

## 2019-03-10 DIAGNOSIS — Z8249 Family history of ischemic heart disease and other diseases of the circulatory system: Secondary | ICD-10-CM | POA: Diagnosis not present

## 2019-03-10 DIAGNOSIS — M9701XA Periprosthetic fracture around internal prosthetic right hip joint, initial encounter: Secondary | ICD-10-CM | POA: Diagnosis present

## 2019-03-10 DIAGNOSIS — C9110 Chronic lymphocytic leukemia of B-cell type not having achieved remission: Secondary | ICD-10-CM | POA: Diagnosis not present

## 2019-03-10 DIAGNOSIS — S72001A Fracture of unspecified part of neck of right femur, initial encounter for closed fracture: Secondary | ICD-10-CM | POA: Diagnosis present

## 2019-03-10 DIAGNOSIS — E11649 Type 2 diabetes mellitus with hypoglycemia without coma: Secondary | ICD-10-CM | POA: Diagnosis not present

## 2019-03-10 DIAGNOSIS — K5900 Constipation, unspecified: Secondary | ICD-10-CM | POA: Diagnosis not present

## 2019-03-10 DIAGNOSIS — M9701XD Periprosthetic fracture around internal prosthetic right hip joint, subsequent encounter: Secondary | ICD-10-CM | POA: Diagnosis not present

## 2019-03-10 DIAGNOSIS — R296 Repeated falls: Secondary | ICD-10-CM | POA: Diagnosis present

## 2019-03-10 DIAGNOSIS — Z96641 Presence of right artificial hip joint: Secondary | ICD-10-CM | POA: Diagnosis not present

## 2019-03-10 DIAGNOSIS — Z66 Do not resuscitate: Secondary | ICD-10-CM | POA: Diagnosis present

## 2019-03-10 DIAGNOSIS — M25551 Pain in right hip: Secondary | ICD-10-CM | POA: Diagnosis not present

## 2019-03-10 DIAGNOSIS — R7309 Other abnormal glucose: Secondary | ICD-10-CM | POA: Diagnosis not present

## 2019-03-10 DIAGNOSIS — I480 Paroxysmal atrial fibrillation: Secondary | ICD-10-CM | POA: Diagnosis present

## 2019-03-10 DIAGNOSIS — Z950 Presence of cardiac pacemaker: Secondary | ICD-10-CM | POA: Diagnosis not present

## 2019-03-10 DIAGNOSIS — Z7984 Long term (current) use of oral hypoglycemic drugs: Secondary | ICD-10-CM | POA: Diagnosis not present

## 2019-03-10 DIAGNOSIS — K5903 Drug induced constipation: Secondary | ICD-10-CM | POA: Diagnosis not present

## 2019-03-10 DIAGNOSIS — E871 Hypo-osmolality and hyponatremia: Secondary | ICD-10-CM | POA: Diagnosis not present

## 2019-03-10 DIAGNOSIS — D62 Acute posthemorrhagic anemia: Secondary | ICD-10-CM | POA: Diagnosis not present

## 2019-03-10 DIAGNOSIS — S72002S Fracture of unspecified part of neck of left femur, sequela: Secondary | ICD-10-CM | POA: Diagnosis not present

## 2019-03-10 DIAGNOSIS — T45515A Adverse effect of anticoagulants, initial encounter: Secondary | ICD-10-CM | POA: Diagnosis present

## 2019-03-10 DIAGNOSIS — E785 Hyperlipidemia, unspecified: Secondary | ICD-10-CM | POA: Diagnosis present

## 2019-03-10 DIAGNOSIS — W19XXXA Unspecified fall, initial encounter: Secondary | ICD-10-CM | POA: Diagnosis not present

## 2019-03-10 DIAGNOSIS — Y92009 Unspecified place in unspecified non-institutional (private) residence as the place of occurrence of the external cause: Secondary | ICD-10-CM | POA: Diagnosis not present

## 2019-03-10 DIAGNOSIS — J45909 Unspecified asthma, uncomplicated: Secondary | ICD-10-CM | POA: Diagnosis present

## 2019-03-10 DIAGNOSIS — H9191 Unspecified hearing loss, right ear: Secondary | ICD-10-CM | POA: Diagnosis present

## 2019-03-10 DIAGNOSIS — E875 Hyperkalemia: Secondary | ICD-10-CM | POA: Diagnosis not present

## 2019-03-10 DIAGNOSIS — Z9181 History of falling: Secondary | ICD-10-CM | POA: Diagnosis not present

## 2019-03-10 DIAGNOSIS — I10 Essential (primary) hypertension: Secondary | ICD-10-CM | POA: Diagnosis not present

## 2019-03-10 DIAGNOSIS — E162 Hypoglycemia, unspecified: Secondary | ICD-10-CM | POA: Diagnosis not present

## 2019-03-10 DIAGNOSIS — Z87891 Personal history of nicotine dependence: Secondary | ICD-10-CM | POA: Diagnosis not present

## 2019-03-10 DIAGNOSIS — I5022 Chronic systolic (congestive) heart failure: Secondary | ICD-10-CM | POA: Diagnosis not present

## 2019-03-10 DIAGNOSIS — S20229D Contusion of unspecified back wall of thorax, subsequent encounter: Secondary | ICD-10-CM | POA: Diagnosis not present

## 2019-03-10 DIAGNOSIS — I4821 Permanent atrial fibrillation: Secondary | ICD-10-CM | POA: Diagnosis present

## 2019-03-10 DIAGNOSIS — R079 Chest pain, unspecified: Secondary | ICD-10-CM | POA: Diagnosis not present

## 2019-03-10 DIAGNOSIS — K219 Gastro-esophageal reflux disease without esophagitis: Secondary | ICD-10-CM | POA: Diagnosis present

## 2019-03-10 DIAGNOSIS — Z20828 Contact with and (suspected) exposure to other viral communicable diseases: Secondary | ICD-10-CM | POA: Diagnosis present

## 2019-03-10 DIAGNOSIS — Z96642 Presence of left artificial hip joint: Secondary | ICD-10-CM | POA: Diagnosis present

## 2019-03-10 DIAGNOSIS — R600 Localized edema: Secondary | ICD-10-CM | POA: Diagnosis not present

## 2019-03-10 DIAGNOSIS — J45991 Cough variant asthma: Secondary | ICD-10-CM | POA: Diagnosis not present

## 2019-03-10 DIAGNOSIS — D630 Anemia in neoplastic disease: Secondary | ICD-10-CM | POA: Diagnosis present

## 2019-03-10 DIAGNOSIS — S20229A Contusion of unspecified back wall of thorax, initial encounter: Secondary | ICD-10-CM | POA: Diagnosis present

## 2019-03-10 DIAGNOSIS — M9701XS Periprosthetic fracture around internal prosthetic right hip joint, sequela: Secondary | ICD-10-CM | POA: Diagnosis not present

## 2019-03-10 DIAGNOSIS — C9111 Chronic lymphocytic leukemia of B-cell type in remission: Secondary | ICD-10-CM | POA: Diagnosis present

## 2019-03-10 DIAGNOSIS — S3991XA Unspecified injury of abdomen, initial encounter: Secondary | ICD-10-CM | POA: Diagnosis not present

## 2019-03-10 DIAGNOSIS — Z23 Encounter for immunization: Secondary | ICD-10-CM | POA: Diagnosis not present

## 2019-03-10 DIAGNOSIS — Z825 Family history of asthma and other chronic lower respiratory diseases: Secondary | ICD-10-CM | POA: Diagnosis not present

## 2019-03-10 DIAGNOSIS — E1142 Type 2 diabetes mellitus with diabetic polyneuropathy: Secondary | ICD-10-CM | POA: Diagnosis present

## 2019-03-10 DIAGNOSIS — D689 Coagulation defect, unspecified: Secondary | ICD-10-CM | POA: Diagnosis present

## 2019-03-10 DIAGNOSIS — E1165 Type 2 diabetes mellitus with hyperglycemia: Secondary | ICD-10-CM | POA: Diagnosis not present

## 2019-03-10 DIAGNOSIS — W1830XD Fall on same level, unspecified, subsequent encounter: Secondary | ICD-10-CM | POA: Diagnosis not present

## 2019-03-10 LAB — CBG MONITORING, ED
Glucose-Capillary: 195 mg/dL — ABNORMAL HIGH (ref 70–99)
Glucose-Capillary: 149 mg/dL — ABNORMAL HIGH (ref 70–99)

## 2019-03-10 LAB — TYPE AND SCREEN
ABO/RH(D): O POS
Antibody Screen: NEGATIVE

## 2019-03-10 LAB — CBC WITH DIFFERENTIAL/PLATELET
Abs Immature Granulocytes: 0.17 10*3/uL — ABNORMAL HIGH (ref 0.00–0.07)
Basophils Absolute: 0.1 10*3/uL (ref 0.0–0.1)
Basophils Relative: 0 %
Eosinophils Absolute: 0.4 10*3/uL (ref 0.0–0.5)
Eosinophils Relative: 1 %
HCT: 37.7 % — ABNORMAL LOW (ref 39.0–52.0)
Hemoglobin: 12.2 g/dL — ABNORMAL LOW (ref 13.0–17.0)
Immature Granulocytes: 1 %
Lymphocytes Relative: 67 %
Lymphs Abs: 22.5 10*3/uL — ABNORMAL HIGH (ref 0.7–4.0)
MCH: 32.9 pg (ref 26.0–34.0)
MCHC: 32.4 g/dL (ref 30.0–36.0)
MCV: 101.6 fL — ABNORMAL HIGH (ref 80.0–100.0)
Monocytes Absolute: 4.1 10*3/uL — ABNORMAL HIGH (ref 0.1–1.0)
Monocytes Relative: 12 %
Neutro Abs: 6.4 10*3/uL (ref 1.7–7.7)
Neutrophils Relative %: 19 %
Platelets: 255 10*3/uL (ref 150–400)
RBC: 3.71 MIL/uL — ABNORMAL LOW (ref 4.22–5.81)
RDW: 18.9 % — ABNORMAL HIGH (ref 11.5–15.5)
WBC: 33.7 10*3/uL — ABNORMAL HIGH (ref 4.0–10.5)
nRBC: 0.1 % (ref 0.0–0.2)

## 2019-03-10 LAB — BASIC METABOLIC PANEL
Anion gap: 7 (ref 5–15)
Anion gap: 9 (ref 5–15)
BUN: 17 mg/dL (ref 8–23)
BUN: 17 mg/dL (ref 8–23)
CO2: 25 mmol/L (ref 22–32)
CO2: 26 mmol/L (ref 22–32)
Calcium: 8.6 mg/dL — ABNORMAL LOW (ref 8.9–10.3)
Calcium: 8.9 mg/dL (ref 8.9–10.3)
Chloride: 105 mmol/L (ref 98–111)
Chloride: 105 mmol/L (ref 98–111)
Creatinine, Ser: 0.96 mg/dL (ref 0.61–1.24)
Creatinine, Ser: 1.03 mg/dL (ref 0.61–1.24)
GFR calc Af Amer: >60 mL/min (ref >60)
GFR calc Af Amer: >60 mL/min (ref >60)
GFR calc non Af Amer: >60 mL/min (ref >60)
GFR calc non Af Amer: >60 mL/min (ref >60)
Glucose, Bld: 164 mg/dL — ABNORMAL HIGH (ref 70–99)
Glucose, Bld: 188 mg/dL — ABNORMAL HIGH (ref 70–99)
Potassium: 4.6 mmol/L (ref 3.5–5.1)
Potassium: 4.7 mmol/L (ref 3.5–5.1)
Sodium: 137 mmol/L (ref 135–145)
Sodium: 140 mmol/L (ref 135–145)

## 2019-03-10 LAB — PROTIME-INR
INR: 3.5 — ABNORMAL HIGH (ref 0.8–1.2)
INR: 3.8 — ABNORMAL HIGH (ref 0.8–1.2)
Prothrombin Time: 34.6 seconds — ABNORMAL HIGH (ref 11.4–15.2)
Prothrombin Time: 36.7 seconds — ABNORMAL HIGH (ref 11.4–15.2)

## 2019-03-10 LAB — SURGICAL PCR SCREEN
MRSA, PCR: NEGATIVE
Staphylococcus aureus: POSITIVE — AB

## 2019-03-10 LAB — SARS CORONAVIRUS 2 (TAT 6-24 HRS): SARS Coronavirus 2: NEGATIVE

## 2019-03-10 LAB — GLUCOSE, CAPILLARY
Glucose-Capillary: 156 mg/dL — ABNORMAL HIGH (ref 70–99)
Glucose-Capillary: 187 mg/dL — ABNORMAL HIGH (ref 70–99)

## 2019-03-10 MED ORDER — ATORVASTATIN CALCIUM 10 MG PO TABS
20.0000 mg | ORAL_TABLET | Freq: Every day | ORAL | Status: DC
  Administered 2019-03-10 – 2019-03-19 (×10): 20 mg via ORAL
  Filled 2019-03-10 (×10): qty 2

## 2019-03-10 MED ORDER — TRAZODONE HCL 50 MG PO TABS
50.0000 mg | ORAL_TABLET | Freq: Once | ORAL | Status: AC
  Administered 2019-03-10: 50 mg via ORAL
  Filled 2019-03-10: qty 1

## 2019-03-10 MED ORDER — MORPHINE SULFATE (PF) 2 MG/ML IV SOLN
1.0000 mg | INTRAVENOUS | Status: DC | PRN
  Administered 2019-03-10 – 2019-03-16 (×18): 1 mg via INTRAVENOUS
  Filled 2019-03-10 (×18): qty 1

## 2019-03-10 MED ORDER — IPRATROPIUM BROMIDE 0.02 % IN SOLN
0.5000 mg | Freq: Three times a day (TID) | RESPIRATORY_TRACT | Status: DC
  Administered 2019-03-10 – 2019-03-12 (×7): 0.5 mg via RESPIRATORY_TRACT
  Filled 2019-03-10 (×8): qty 2.5

## 2019-03-10 MED ORDER — GUAIFENESIN ER 600 MG PO TB12
600.0000 mg | ORAL_TABLET | Freq: Two times a day (BID) | ORAL | Status: DC | PRN
  Administered 2019-03-14 – 2019-03-18 (×4): 600 mg via ORAL
  Filled 2019-03-10 (×5): qty 1

## 2019-03-10 MED ORDER — FENOFIBRATE 160 MG PO TABS
160.0000 mg | ORAL_TABLET | Freq: Every day | ORAL | Status: DC
  Administered 2019-03-11 – 2019-03-20 (×9): 160 mg via ORAL
  Filled 2019-03-10 (×11): qty 1

## 2019-03-10 MED ORDER — VITAMIN B-12 1000 MCG PO TABS
1000.0000 ug | ORAL_TABLET | Freq: Every day | ORAL | Status: DC
  Administered 2019-03-11 – 2019-03-20 (×9): 1000 ug via ORAL
  Filled 2019-03-10 (×10): qty 1

## 2019-03-10 MED ORDER — ADULT MULTIVITAMIN W/MINERALS CH
1.0000 | ORAL_TABLET | Freq: Every day | ORAL | Status: DC
  Administered 2019-03-11 – 2019-03-20 (×9): 1 via ORAL
  Filled 2019-03-10 (×9): qty 1

## 2019-03-10 MED ORDER — GABAPENTIN 300 MG PO CAPS
300.0000 mg | ORAL_CAPSULE | Freq: Three times a day (TID) | ORAL | Status: DC
  Administered 2019-03-10 – 2019-03-20 (×29): 300 mg via ORAL
  Filled 2019-03-10 (×29): qty 1

## 2019-03-10 MED ORDER — CARVEDILOL 3.125 MG PO TABS
3.1250 mg | ORAL_TABLET | Freq: Two times a day (BID) | ORAL | Status: DC
  Administered 2019-03-10 – 2019-03-20 (×21): 3.125 mg via ORAL
  Filled 2019-03-10 (×22): qty 1

## 2019-03-10 MED ORDER — INSULIN ASPART 100 UNIT/ML ~~LOC~~ SOLN
0.0000 [IU] | Freq: Three times a day (TID) | SUBCUTANEOUS | Status: DC
  Administered 2019-03-10: 14:00:00 1 [IU] via SUBCUTANEOUS
  Administered 2019-03-10 – 2019-03-11 (×3): 2 [IU] via SUBCUTANEOUS
  Administered 2019-03-11: 13:00:00 3 [IU] via SUBCUTANEOUS
  Administered 2019-03-11: 17:00:00 2 [IU] via SUBCUTANEOUS
  Administered 2019-03-12: 5 [IU] via SUBCUTANEOUS
  Administered 2019-03-12 (×2): 3 [IU] via SUBCUTANEOUS
  Administered 2019-03-13: 7 [IU] via SUBCUTANEOUS
  Administered 2019-03-13: 08:00:00 3 [IU] via SUBCUTANEOUS
  Administered 2019-03-13: 12:00:00 5 [IU] via SUBCUTANEOUS
  Administered 2019-03-14: 09:00:00 9 [IU] via SUBCUTANEOUS
  Administered 2019-03-14: 13:00:00 7 [IU] via SUBCUTANEOUS
  Administered 2019-03-14: 3 [IU] via SUBCUTANEOUS
  Administered 2019-03-15: 17:00:00 2 [IU] via SUBCUTANEOUS
  Administered 2019-03-15: 08:00:00 3 [IU] via SUBCUTANEOUS
  Administered 2019-03-16: 08:00:00 2 [IU] via SUBCUTANEOUS
  Administered 2019-03-16: 11:00:00 3 [IU] via SUBCUTANEOUS
  Administered 2019-03-16: 7 [IU] via SUBCUTANEOUS
  Administered 2019-03-17: 08:00:00 3 [IU] via SUBCUTANEOUS
  Administered 2019-03-17: 13:00:00 5 [IU] via SUBCUTANEOUS
  Administered 2019-03-17: 17:00:00 2 [IU] via SUBCUTANEOUS
  Administered 2019-03-18: 09:00:00 1 [IU] via SUBCUTANEOUS
  Administered 2019-03-18: 2 [IU] via SUBCUTANEOUS
  Administered 2019-03-18: 18:00:00 3 [IU] via SUBCUTANEOUS
  Administered 2019-03-19 (×2): 2 [IU] via SUBCUTANEOUS
  Administered 2019-03-19: 13:00:00 3 [IU] via SUBCUTANEOUS
  Administered 2019-03-20 (×2): 1 [IU] via SUBCUTANEOUS

## 2019-03-10 MED ORDER — IOHEXOL 300 MG/ML  SOLN
100.0000 mL | Freq: Once | INTRAMUSCULAR | Status: AC | PRN
  Administered 2019-03-10: 100 mL via INTRAVENOUS

## 2019-03-10 NOTE — Plan of Care (Signed)

## 2019-03-10 NOTE — H&P (Signed)
History and Physical    Oscar Baker OMV:[id number] DOB: Mar 28, 1933 DOA: 03/09/2019  PCP: [name], MD  Patient coming from: Home.  Chief Complaint: Fall.  HPI: Oscar Baker is a 85 y.o. male with history of atrial fibrillation, pacemaker placement for bradycardia, diastolic dysfunction, CHF, anemia, diabetes mellitus, prostate cancer had a fall at home after tripping.  Fell on backwards and hit chest on the mid back.  Patient denies losing consciousness or having any chest pain shortness of breath palpitation.  ED Course: In the ER x-rays and CT scan reveals a right hip periprosthetic fracture.  On-call orthopedic surgeon Dr. Alvan Dame was consulted.  On exam patient has mid back bruising CAT scan revealed mildly small hematoma which is not enlarging.  EKG shows atrial flutter rate controlled.  Labs reveal leukocytosis from known CLL mild anemia otherwise largely unremarkable.  INR is 3.8 and patient is on Coumadin.  COVID-19 test is pending.  Review of Systems: As per HPI, rest all negative.   Past Medical History:  Diagnosis Date   Asthma 1950's   history of   Atrial fibrillation (HCC)    Bilateral carpal tunnel syndrome    Bilateral lower extremity edema    Bladder tumor    Chronic systolic heart failure ([place])    Echo 1/19: Mild LVH, EF 45-50, inf HK, MAC, severe LAE, severe RAE // Echo 7/15: Mild LVH, mod focal basal sept hypertrophy, EF 55-60, AV peak and mean 16/9, trivial MR, mod LAE, PASP 38   CLL (chronic lymphocytic leukemia) [facility]) oncologist-  dr Ilene Qua--   dx 01-2014 ;  Lymphocytosis, CLL - per lov note 05-11-2017 currently under active survillance,  CT 04-17-2014 show very small lymphadenopathy, no indication for treatment   Coronary artery disease    cardiologist-  dr Cathie Olden--  08-18-2017 Intermittant risk nuclear study w/ large area of inferior infartion with no evidence ishcemia    Deafness in right ear    Diabetes mellitus type 2, noninsulin dependent  (HCC)    Elevated PSA    since prostatectomy but now resolved   Hematuria 04/2017   History of ear infection    Right   History of MI (myocardial infarction)    per myoview nuclear study 08-18-2017 , unknown when   History of shingles 08/2017   L ear and scalp, possible   Hyperlipidemia    Hypertension    Ischemic cardiomyopathy 09/01/2017   Presumed +CAD with Nuclear stress test 08/18/17 - Inferior scar, no ischemia, intermediate risk // med management unless +angina or worse dyspnea   OA (osteoarthritis)    Pacemaker 02/08/2014   followed by dr g. taylor--  single chamber Biotronik due to SSS   Permanent atrial fibrillation    Pneumonia 2019   Left lung   Prostate cancer [facility]) urologist-  dr Diona Fanti   dx 2004--  Gleason 8, PSA 10.45--  11-28-2002  s/p  radical prostatectomy;  recurrent w/ increasing PSA, started ADT treatment   RBBB (right bundle branch block)    Sick sinus syndrome ([place])    a-Flutter with episodes of bradycardia; S/P Biotronik (serial number [id number]) 02-08-2014   Urinary incontinence    Wears hearing aid in right ear    receiver and transmitter    Past Surgical History:  Procedure Laterality Date   APPENDECTOMY     BACK SURGERY     disk   CARDIAC CATHETERIZATION  09-03-1999  dr Cathie Olden   abnormal cardiolite study:  minor luminal irregularities but  no critial coronary artery stenosis   CARDIOVASCULAR STRESS TEST  08-18-2017  dr Cathie Olden   Intermediate risk nuclear study w/ large area inferior infarction, no evidence of ishcemia (consistant w/ prior MI)/  study not gated due to frequent PVCs   CARPAL TUNNEL RELEASE Right 2000   CARPAL TUNNEL RELEASE Left 11/19/2009   CATARACT EXTRACTION Right 07/2015   CATARACT EXTRACTION Left 09/2015   CYSTOSCOPY N/A 11/04/2017   Procedure: CYSTOSCOPY AND CAUTERIZATION OF BLADDER;  Surgeon: Franchot Gallo, MD;  Location: [facility];  Service: Urology;  Laterality: N/A;    INSERTION PENILE PROSTHESIS  02-22-2004    dr Mattie Marlin  [facility]   IR THORACENTESIS ASP PLEURAL SPACE W/IMG GUIDE  11/26/2017   KNEE ARTHROSCOPY Left 07/2010   PERMANENT PACEMAKER INSERTION N/A 02/08/2014   Procedure: PERMANENT PACEMAKER INSERTION;  Surgeon: Evans Lance, MD;  Location: [facility] CATH LAB;  Service: Cardiovascular;  Laterality: N/A;   PLEURAL EFFUSION DRAINAGE Left 11/30/2017   Procedure: DRAINAGE OF HEMOTHORAX;  Surgeon: Ivin Poot, MD;  Location: [place];  Service: Thoracic;  Laterality: Left;   RADICAL RETROPUBIC PROSTATECTOMY W/ BILATERAL PELVIC LYMPH NODE DISSECTION  11-28-2002   dr Mattie Marlin  [facility]   TONSILLECTOMY     TOTAL HIP ARTHROPLASTY Left 05/12/2016   Procedure: LEFT TOTAL HIP ARTHROPLASTY ANTERIOR APPROACH;  Surgeon: Paralee Cancel, MD;  Location: [facility];  Service: Orthopedics;  Laterality: Left;   TOTAL HIP ARTHROPLASTY Right 07-15-2006   dr Alvan Dame  [facility]   TRANSTHORACIC ECHOCARDIOGRAM  07/20/2017   mild LVH, ef 45-50%, hypokinesis of the basal-midinferior myocardium, due to AFib unable to evaluate diastolic function/  severe LAE and RAE/  trivial PR and TR   VIDEO ASSISTED THORACOSCOPY Left 11/30/2017   Procedure: VIDEO ASSISTED THORACOSCOPY;  Surgeon: Ivin Poot, MD;  Location: [place];  Service: Thoracic;  Laterality: Left;   VIDEO ASSISTED THORACOSCOPY (VATS)/EMPYEMA Left 11/29/2017   Procedure: VIDEO ASSISTED THORACOSCOPY (VATS)/EMPYEMA;  Surgeon: Ivin Poot, MD;  Location: [place];  Service: Thoracic;  Laterality: Left;     reports that he quit smoking about 43 years ago. His smoking use included pipe and cigars. He quit after 25.00 years of use. He has never used smokeless tobacco. He reports current alcohol use. He reports that he does not use drugs.  Allergies  Allergen Reactions   Altace [Ramipril] Other (See Comments)    "throat felt like had a knot in it"   Augmentin [Amoxicillin-Pot Clavulanate] Diarrhea    severe   Codeine Nausea And  Vomiting    Nausea and vomiting    Simvastatin Other (See Comments)    Leg aches    Family History  Problem Relation Age of Onset   Emphysema Mother    Allergic rhinitis Mother    Heart attack Father    Allergic rhinitis Brother    Pulmonary fibrosis Brother     Prior to Admission medications   Medication Sig Start Date End Date Taking? Authorizing Provider  atorvastatin (LIPITOR) 20 MG tablet TAKE ONE TABLET EACH DAY AT 6PM Patient taking differently: Take 20 mg by mouth daily at 6 PM.  03/07/18  Yes Nahser, Wonda Cheng, MD  carvedilol (COREG) 3.125 MG tablet TAKE ONE TABLET TWICE DAILY Patient taking differently: Take 3.125 mg by mouth 2 (two) times daily with a meal.  09/07/18  Yes Evans Lance, MD  fenofibrate micronized (LOFIBRA) 200 MG capsule TAKE ONE CAPSULE [place] BREAKFAST Patient taking differently:  Take 200 mg by mouth daily before breakfast.  09/07/18  Yes Nahser, Wonda Cheng, MD  furosemide (LASIX) 20 MG tablet TAKE ONE TABLET DAILY AS NEEDED Patient taking differently: Take 20 mg by mouth daily.  09/07/18  Yes Evans Lance, MD  gabapentin (NEURONTIN) 300 MG capsule Take 1 capsule (300 mg total) by mouth 3 (three) times daily. 01/30/19  Yes Olalere, Adewale A, MD  glipiZIDE (GLUCOTROL XL) 2.5 MG 24 hr tablet Take 2.5 mg by mouth daily with breakfast.   Yes [provider]  guaiFENesin (MUCINEX) 600 MG 12 hr tablet Take 600 mg by mouth 2 (two) times daily as needed for cough.    Yes [provider]  HYDROcodone-homatropine (HYCODAN) 5-1.5 MG/5ML syrup Take 5 mLs by mouth every 6 (six) hours as needed for cough. 06/23/18  Yes Martyn Ehrich, NP  ipratropium (ATROVENT) 0.02 % nebulizer solution TAKE 2.62m BY NEBULIZER 3 TIMES DAILY Patient taking differently: Take 0.5 mg by nebulization 3 (three) times daily.  03/01/19  Yes Olalere, Adewale A, MD  metFORMIN (GLUCOPHAGE) 1000 MG tablet Take 1,000 mg by mouth 2 (two) times daily with a meal.      Yes [provider]  Multiple Vitamin (MULTIVITAMIN WITH MINERALS) TABS tablet Take 1 tablet by mouth daily.   Yes [provider]  sitaGLIPtin (JANUVIA) 100 MG tablet Take 100 mg by mouth daily.   Yes [provider]  vitamin B-12 (CYANOCOBALAMIN) 1000 MCG tablet Take 1,000 mcg by mouth daily.    Yes [provider]  warfarin (COUMADIN) 5 MG tablet TAKE AS DIRECTED BY COUMADIN CLINIC Patient taking differently: Take 5-7.5 mg by mouth See admin instructions. Take 1 and 1/2 tablets on Saturday and Tuesday then take 1 tablet all the other days 01/09/19  Yes TEvans Lance MD  acetaminophen (TYLENOL) 500 MG tablet Take 2 tablets (1,000 mg total) by mouth every 6 (six) hours. Patient not taking: Reported on 03/10/2019 12/05/17   CElgie Collard PA-C  Nebulizers (VIOS LC SPRINT DELUXE) MISC Use 1 each every 6 (six) hours as needed (shortness of breath or wheezing) [facility]   [provider]  sodium chloride HYPERTONIC 3 % nebulizer solution Take by nebulization 3 (three) times daily. Patient not taking: Reported on 01/30/2019 09/20/18   NFenton Foy NP    Physical Exam: Constitutional: Moderately built and nourished. Vitals:   03/10/19 0145 03/10/19 0315 03/10/19 0330 03/10/19 0345  BP: 134/61 119/61 116/66 126/69  Pulse: 66 68 71 74  Resp:  (!) 26 (!) 27 (!) 27  Temp:      TempSrc:      SpO2: 94% 93% 92% 94%  Weight:      Height:       Eyes: Anicteric no pallor. ENMT: No discharge from the ears eyes nose or mouth. Neck: No mass felt.  No neck rigidity. Respiratory: No rhonchi or crepitations. Cardiovascular: S1-S2 heard. Abdomen: Soft nontender bowel tones present. Musculoskeletal: No edema.  Pain on moving the right hip. Skin: Chronic skin changes. Neurologic: Alert awake oriented to time place and person.  Moves all extremities. Psychiatric: Appears normal.   Labs on Admission: I have personally reviewed following labs  and imaging studies  CBC: Recent Labs  Lab 03/09/19 2357  WBC 33.7*  NEUTROABS 6.4  HGB 12.2*  HCT 37.7*  MCV 101.6*  PLT 2937  Basic Metabolic Panel: Recent Labs  Lab 03/09/19 2357  NA 140  K 4.7  CL 105  CO2 26  GLUCOSE 164*  BUN 17  CREATININE 1.03  CALCIUM 8.9   GFR: Estimated Creatinine Clearance: 56.9 mL/min (by C-G formula based on SCr of 1.03 mg/dL). Liver Function Tests: No results for input(s): AST, ALT, ALKPHOS, BILITOT, PROT, ALBUMIN in the last 168 hours. No results for input(s): LIPASE, AMYLASE in the last 168 hours. No results for input(s): AMMONIA in the last 168 hours. Coagulation Profile: Recent Labs  Lab 03/09/19 2357  INR 3.8*   Cardiac Enzymes: No results for input(s): CKTOTAL, CKMB, CKMBINDEX, TROPONINI in the last 168 hours. BNP (last 3 results) No results for input(s): PROBNP in the last 8760 hours. HbA1C: No results for input(s): HGBA1C in the last 72 hours. CBG: No results for input(s): GLUCAP in the last 168 hours. Lipid Profile: No results for input(s): CHOL, HDL, LDLCALC, TRIG, CHOLHDL, LDLDIRECT in the last 72 hours. Thyroid Function Tests: No results for input(s): TSH, T4TOTAL, FREET4, T3FREE, THYROIDAB in the last 72 hours. Anemia Panel: No results for input(s): VITAMINB12, FOLATE, FERRITIN, TIBC, IRON, RETICCTPCT in the last 72 hours. Urine analysis:    Component Value Date/Time   COLORURINE YELLOW 11/27/2017 1454   APPEARANCEUR HAZY (A) 11/27/2017 1454   LABSPEC 1.018 11/27/2017 1454   PHURINE 7.0 11/27/2017 1454   GLUCOSEU >=500 (A) 11/27/2017 1454   HGBUR SMALL (A) 11/27/2017 1454   BILIRUBINUR NEGATIVE 11/27/2017 1454   KETONESUR NEGATIVE 11/27/2017 1454   PROTEINUR NEGATIVE 11/27/2017 1454   NITRITE NEGATIVE 11/27/2017 1454   LEUKOCYTESUR MODERATE (A) 11/27/2017 1454   Sepsis Labs: _0 (procalcitonin:4,lacticidven:4) )No results found for this or any previous visit (from the past 240 hour(s)).    Radiological Exams on Admission: Dg Chest 1 View  Result Date: 03/10/2019 CLINICAL DATA:  Recent fall with chest pain, initial encounter EXAM: CHEST  1 VIEW COMPARISON:  05/20/2018 FINDINGS: Cardiac shadow is enlarged. Pacing device is again noted and stable. The lungs are well aerated bilaterally. No focal infiltrate or sizable effusion is seen. No acute bony abnormality is noted. IMPRESSION: No acute abnormality noted. Electronically Signed   By: Inez Catalina M.D.   On: 03/10/2019 00:40   Ct Abdomen Pelvis W Contrast  Addendum Date: 03/10/2019   ADDENDUM REPORT: 03/10/2019 01:56 ADDENDUM: At the level of T12-L1 posteriorly in the subcutaneous soft tissues, there is some increased attenuation identified likely related to underlying bruising although no sizable or expanding hematoma is noted. Electronically Signed   By: Inez Catalina M.D.   On: 03/10/2019 01:56   Result Date: 03/10/2019 CLINICAL DATA:  Recent fall EXAM: CT ABDOMEN AND PELVIS WITH CONTRAST TECHNIQUE: Multidetector CT imaging of the abdomen and pelvis was performed using the standard protocol following bolus administration of intravenous contrast. CONTRAST:  130m OMNIPAQUE IOHEXOL 300 MG/ML  SOLN COMPARISON:  04/17/2014 FINDINGS: Lower chest: Mild atelectatic changes are noted in the bases. No focal infiltrate is seen. Hepatobiliary: Small gallstone is noted within the gallbladder. The liver is within normal limits. Pancreas: Unremarkable. No pancreatic ductal dilatation or surrounding inflammatory changes. Spleen: Normal in size without focal abnormality. Adrenals/Urinary Tract: The adrenal glands are within normal limits. Kidneys demonstrate normal enhancement pattern bilaterally. Normal excretion is seen. No renal or ureteral calculi are seen. The bladder is decompressed. Stomach/Bowel: No obstructive or inflammatory changes of the colon are seen. The appendix is not well visualized consistent with prior surgical history. No small  bowel abnormality is seen. No gastric abnormality is noted. Vascular/Lymphatic: Aortic atherosclerosis. No enlarged abdominal or pelvic  lymph nodes. Reproductive: Status post prostatectomy. Penile prosthesis is noted. Other: No abdominal wall hernia or abnormality. No abdominopelvic ascites. Musculoskeletal: Bilateral femoral prosthesis are seen. Periprosthetic fracture is noted in the proximal right femur similar to that noted on prior plain film. Degenerative changes of the lumbar spine are noted. IMPRESSION: Periprosthetic fracture in the proximal right femur. Cholelithiasis without complicating factors. Chronic changes as described above. Electronically Signed: By: Inez Catalina M.D. On: 03/10/2019 01:38   Dg Hip Unilat W Or Wo Pelvis 2-3 Views Right  Result Date: 03/10/2019 CLINICAL DATA:  Recent fall with leg pain, initial encounter EXAM: DG HIP (WITH OR WITHOUT PELVIS) 3V RIGHT COMPARISON:  None. FINDINGS: Bilateral hip replacements are noted. Mildly displaced fracture is noted in the proximal shaft of the femur surrounding the prosthesis. Pelvic ring is intact. Degenerative changes of lumbar spine are noted. Postoperative changes consistent with prostate surgery are noted. Penile prosthesis is noted. IMPRESSION: Minimally displaced periprosthetic proximal femoral fracture. Electronically Signed   By: Inez Catalina M.D.   On: 03/10/2019 00:41    EKG: Independently reviewed.  Atrial flutter rate controlled.  Assessment/Plan Principal Problem:   Closed right hip fracture, initial encounter ([place]) Active Problems:   PAF (paroxysmal atrial fibrillation) (HCC)   Diabetes mellitus type 2, noninsulin dependent (HCC)   Laryngopharyngeal reflux   CLL (chronic lymphocytic leukemia) (HCC)   Type 2 diabetes mellitus ([place])    1. Right-sided periprosthetic hip fracture -orthopedic surgeon Dr. Alvan Dame has been consulted.  Patient is on pain relief medication.  Patient's INR is elevated at 3.8 and if surgery is  planned then we will need to keep INR less than 1.5. 2. Atrial fibrillation and history of pacemaker placement presently INR is 3.8 Coumadin on hold in anticipation of possible procedure.  If procedures not planned then restart Coumadin but note that patient has a bruise on the mid back closely observe.  Rate controlled on carvedilol. 3. Mid back bruise see #2.  Patient is on Coumadin. 4. CLL with leukocytosis and anemia follow CBC patient on B12 supplements. 5. Hyperlipidemia on statins. 6. Diabetes mellitus type 2 on sliding scale coverage. 7. Hyperlipidemia on statins and fenofibrate.  Given that patient has fracture with coagulopathy will need close monitoring and will need more than 2 midnight stay and he will be admitted as inpatient.   DVT prophylaxis: Coumadin.  See #2 with regarding to Coumadin presently on hold.  INR is therapeutic. Code Status: DNR confirmed with patient's wife. Family Communication: Patient's wife. Disposition Plan: Probably will need rehab. Consults called: Orthopedics. Admission status: Inpatient.   Rise Patience MD [facility] Pager [phone].  If 7PM-7AM, please contact night-coverage [email or web address] Password [facility]  03/10/2019, 4:45 AM

## 2019-03-10 NOTE — Progress Notes (Signed)
Patient admitted early this morning for right hip periprosthetic fracture.  Patient seen and examined in the ER.  Wife at the bedside.  He feels better.  Pain better controlled.  No new complaint.  Still awaiting orthopedics to see him.  Hemodynamically stable.

## 2019-03-10 NOTE — ED Notes (Signed)
ED TO INPATIENT HANDOFF REPORT  ED Nurse Name and Phone #: [phone]   S Name/Age/Gender Oscar Baker 85 y.o. male Room/Bed: 038C/038C  Code Status   Code Status: DNR  Home/SNF/Other Home Patient oriented to: self Is this baseline? Yes   Triage Complete: Triage complete  Chief Complaint fall blood thinner   Triage Note  Patient BIB EMS after mechanical fall at home.  Patient was using his walker and got caught up on the carpet and fell on his R hip with legs crossed.  Patient did not hit his head but is on blood thinners.  Patient states his hip dislocated but he popped it back in.  Patient was ambulatory at the scene and states the pain is minimal.  Patient has abrasion to R forearm and elbow.  Patient A&O x4.     Allergies Allergies  Allergen Reactions  . Altace [Ramipril] Other (See Comments)    "throat felt like had a knot in it"  . Augmentin [Amoxicillin-Pot Clavulanate] Diarrhea    severe  . Codeine Nausea And Vomiting    Nausea and vomiting   . Simvastatin Other (See Comments)    Leg aches    Level of Care/Admitting Diagnosis ED Disposition    ED Disposition Condition Comment   Admit  Hospital Area: [facility] [[id number]]  Level of Care: Telemetry Medical [104]  Covid Evaluation: Asymptomatic Screening Protocol (No Symptoms)  Diagnosis: Closed right hip fracture, initial encounter [facility]) [[id number]]  Admitting Physician: Rise Patience [phone]  Attending Physician: Rise Patience [phone]  Estimated length of stay: past midnight tomorrow  Certification:: I certify this patient will need inpatient services for at least 2 midnights  PT Class (Do Not Modify): Inpatient [101]  PT Acc Code (Do Not Modify): Private [1]       B Medical/Surgery History Past Medical History:  Diagnosis Date  . Asthma 1950's   history of  . Atrial fibrillation ([place])   . Bilateral carpal tunnel syndrome   . Bilateral lower extremity edema   . Bladder  tumor   . Chronic systolic heart failure (HCC)    Echo 1/19: Mild LVH, EF 45-50, inf HK, MAC, severe LAE, severe RAE // Echo 7/15: Mild LVH, mod focal basal sept hypertrophy, EF 55-60, AV peak and mean 16/9, trivial MR, mod LAE, PASP 38  . CLL (chronic lymphocytic leukemia) [facility]) oncologist-  dr Ilene Qua--   dx [phone] ;  Lymphocytosis, CLL - per lov note 05-11-2017 currently under active survillance,  CT 04-17-2014 show very small lymphadenopathy, no indication for treatment  . Coronary artery disease    cardiologist-  dr Cathie Olden--  08-18-2017 Intermittant risk nuclear study w/ large area of inferior infartion with no evidence ishcemia   . 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 Diabetes mellitus type 2, noninsulin dependent ([place])   . Elevated PSA    since prostatectomy but now resolved  . Hematuria 04/2017  . History of ear infection    Right  . History of MI (myocardial infarction)    per myoview nuclear study 08-18-2017 , unknown when  . History of shingles 08/2017   L ear and scalp, possible  . Hyperlipidemia   . Hypertension   . Ischemic cardiomyopathy 09/01/2017   Presumed +CAD with Nuclear stress test 08/18/17 - Inferior scar, no ischemia, intermediate risk // med management unless +angina or worse dyspnea  . OA (osteoarthritis)   . Pacemaker 02/08/2014   followed by dr g. taylor--  single  chamber Biotronik due to SSS  . Permanent atrial fibrillation   . Pneumonia 2019   Left lung  . Prostate cancer [facility]) urologist-  dr Diona Fanti   dx 2004--  Gleason 8, PSA 10.45--  11-28-2002  s/p  radical prostatectomy;  recurrent w/ increasing PSA, started ADT treatment  . RBBB (right bundle branch block)   . Sick sinus syndrome ([place])    a-Flutter with episodes of bradycardia; S/P Biotronik (serial number [id number]) 02-08-2014  . Urinary incontinence   . Wears hearing aid in right ear    receiver and transmitter   Past Surgical History:  Procedure Laterality Date  . APPENDECTOMY    . BACK SURGERY      disk  . CARDIAC CATHETERIZATION  09-03-1999  dr Cathie Olden   abnormal cardiolite study:  minor luminal irregularities but no critial coronary artery stenosis  . CARDIOVASCULAR STRESS TEST  08-18-2017  dr Cathie Olden   Intermediate risk nuclear study w/ large area inferior infarction, no evidence of ishcemia (consistant w/ prior MI)/  study not gated due to frequent PVCs  . CARPAL TUNNEL RELEASE Right 2000  . CARPAL TUNNEL RELEASE Left 11/19/2009  . CATARACT EXTRACTION Right 07/2015  . CATARACT EXTRACTION Left 09/2015  . CYSTOSCOPY N/A 11/04/2017   Procedure: CYSTOSCOPY AND CAUTERIZATION OF BLADDER;  Surgeon: Franchot Gallo, MD;  Location[facility];  Service: Urology;  Laterality: N/A;  . INSERTION PENILE PROSTHESIS  02-22-2004    dr Mattie Marlin  [facility]  . IR THORACENTESIS ASP PLEURAL SPACE W/IMG GUIDE  11/26/2017  . KNEE ARTHROSCOPY Left 07/2010  . PERMANENT PACEMAKER INSERTION N/A 02/08/2014   Procedure: PERMANENT PACEMAKER INSERTION;  Surgeon: Evans Lance, MD;  Location: [facility] CATH [facility];  Service: Cardiovascular;  Laterality: N/A;  . PLEURAL EFFUSION DRAINAGE Left 11/30/2017   Procedure: DRAINAGE OF HEMOTHORAX;  Surgeon: Ivin Poot, MD;  Location: [facility];  Service: Thoracic;  Laterality: Left;  . RADICAL RETROPUBIC PROSTATECTOMY W/ BILATERAL PELVIC LYMPH NODE DISSECTION  11-28-2002   dr Mattie Marlin  [facility]  . TONSILLECTOMY    . TOTAL HIP ARTHROPLASTY Left 05/12/2016   Procedure: LEFT TOTAL HIP ARTHROPLASTY ANTERIOR APPROACH;  Surgeon: Paralee Cancel, MD;  Location: [facility];  Service: Orthopedics;  Laterality: Left;  . TOTAL HIP ARTHROPLASTY Right 07-15-2006   dr Alvan Dame  [facility]  . TRANSTHORACIC ECHOCARDIOGRAM  07/20/2017   mild LVH, ef 45-50%, hypokinesis of the basal-midinferior myocardium, due to AFib unable to evaluate diastolic function/  severe LAE and RAE/  trivial PR and TR  . VIDEO ASSISTED THORACOSCOPY Left 11/30/2017   Procedure: VIDEO ASSISTED THORACOSCOPY;  Surgeon: Ivin Poot,  MD;  Location: [place];  Service: Thoracic;  Laterality: Left;  [name] VIDEO ASSISTED THORACOSCOPY (VATS)/EMPYEMA Left 11/29/2017   Procedure: VIDEO ASSISTED THORACOSCOPY (VATS)/EMPYEMA;  Surgeon: Ivin Poot, MD;  Location: [place];  Service: Thoracic;  Laterality: Left;     A IV Location/Drains/Wounds Patient Lines/Drains/Airways Status   Active Line/Drains/Airways    Name:   Placement date:   Placement time:   Site:   Days:   Peripheral IV 03/10/19 Right Antecubital   03/10/19    0017    Antecubital   less than 1   External Urinary Catheter   05/20/18    2345    -   294   Incision (Closed) 11/29/17 Chest Left   11/29/17    1743     466   Incision (Closed) 11/30/17 Chest Left   11/30/17  5027     465          Intake/Output Last 24 hours No intake or output data in the 24 hours ending 03/10/19 1424  Labs/Imaging Results for orders placed or performed during the hospital encounter of 03/09/19 (from the past 48 hour(s))  Basic metabolic panel     Status: Abnormal   Collection Time: 03/09/19 11:57 PM  Result Value Ref Range   Sodium 140 135 - 145 mmol/L   Potassium 4.7 3.5 - 5.1 mmol/L   Chloride 105 98 - 111 mmol/L   CO2 26 22 - 32 mmol/L   Glucose, Bld 164 (H) 70 - 99 mg/dL   BUN 17 8 - 23 mg/dL   Creatinine, Ser 1.03 0.61 - 1.24 mg/dL   Calcium 8.9 8.9 - 10.3 mg/dL   GFR calc non Af Amer >60 >60 mL/min   GFR calc Af Amer >60 >60 mL/min   Anion gap 9 5 - 15    Comment: Performed at [facility], [place] [address], [place], [place] [address]  CBC WITH DIFFERENTIAL     Status: Abnormal   Collection Time: 03/09/19 11:57 PM  Result Value Ref Range   WBC 33.7 (H) 4.0 - 10.5 K/uL   RBC 3.71 (L) 4.22 - 5.81 MIL/uL   Hemoglobin 12.2 (L) 13.0 - 17.0 g/dL   HCT 37.7 (L) 39.0 - 52.0 %   MCV 101.6 (H) 80.0 - 100.0 fL   MCH 32.9 26.0 - 34.0 pg   MCHC 32.4 30.0 - 36.0 g/dL   RDW 18.9 (H) 11.5 - 15.5 %   Platelets 255 150 - 400 K/uL   nRBC 0.1 0.0 - 0.2 %   Neutrophils Relative %  19 %   Neutro Abs 6.4 1.7 - 7.7 K/uL   Lymphocytes Relative 67 %   Lymphs Abs 22.5 (H) 0.7 - 4.0 K/uL   Monocytes Relative 12 %   Monocytes Absolute 4.1 (H) 0.1 - 1.0 K/uL   Eosinophils Relative 1 %   Eosinophils Absolute 0.4 0.0 - 0.5 K/uL   Basophils Relative 0 %   Basophils Absolute 0.1 0.0 - 0.1 K/uL   Immature Granulocytes 1 %   Abs Immature Granulocytes 0.17 (H) 0.00 - 0.07 K/uL    Comment: Performed at [facility], [address], [place], [place] [address]  Protime-INR     Status: Abnormal   Collection Time: 03/09/19 11:57 PM  Result Value Ref Range   Prothrombin Time 36.7 (H) 11.4 - 15.2 seconds   INR 3.8 (H) 0.8 - 1.2    Comment: (NOTE) INR goal varies based on device and disease states. Performed at [facility], [place] [address] [place], [place] [address]   Type and screen [place]     Status: None   Collection Time: 03/10/19 12:10 AM  Result Value Ref Range   ABO/RH(D) O POS    Antibody Screen NEG    Sample Expiration      03/13/2019,2359 Performed at [facility], [place] [address] [place], [name] [address]   SARS CORONAVIRUS 2 (TAT 6-24 HRS) Nasopharyngeal Nasopharyngeal Swab     Status: None   Collection Time: 03/10/19  1:53 AM   Specimen: Nasopharyngeal Swab  Result Value Ref Range   SARS Coronavirus 2 NEGATIVE NEGATIVE    Comment: (NOTE) SARS-CoV-2 target nucleic acids are NOT DETECTED. The SARS-CoV-2 RNA is generally detectable in upper and lower respiratory specimens during the acute  phase of infection. Negative results do not preclude SARS-CoV-2 infection, do not rule out co-infections with other pathogens, and should not be used as the sole basis for treatment or other patient management decisions. Negative results must be combined with clinical observations, patient history, and epidemiological information. The expected result is Negative. Fact Sheet for  Patients: SugarRoll.be Fact Sheet for Healthcare Providers: https://www.woods-mathews.com/ This test is not yet approved or cleared by the [place] FDA and  has been authorized for detection and/or diagnosis of SARS-CoV-2 by FDA under an Emergency Use Authorization (EUA). This EUA will remain  in effect (meaning this test can be used) for the duration of the COVID-19 declaration under Section 56 4(b)(1) of the Act, 21 U.S.C. section 360bbb-3(b)(1), unless the authorization is terminated or revoked sooner. Performed at [facility], [place] [address] [place], [place] [address]   Protime-INR     Status: Abnormal   Collection Time: 03/10/19  6:18 AM  Result Value Ref Range   Prothrombin Time 34.6 (H) 11.4 - 15.2 seconds   INR 3.5 (H) 0.8 - 1.2    Comment: (NOTE) INR goal varies based on device and disease states. Performed at [facility], [place] [address] [place], [place] [address]   Basic metabolic panel     Status: Abnormal   Collection Time: 03/10/19  6:18 AM  Result Value Ref Range   Sodium 137 135 - 145 mmol/L   Potassium 4.6 3.5 - 5.1 mmol/L   Chloride 105 98 - 111 mmol/L   CO2 25 22 - 32 mmol/L   Glucose, Bld 188 (H) 70 - 99 mg/dL   BUN 17 8 - 23 mg/dL   Creatinine, Ser 0.96 0.61 - 1.24 mg/dL   Calcium 8.6 (L) 8.9 - 10.3 mg/dL   GFR calc non Af Amer >60 >60 mL/min   GFR calc Af Amer >60 >60 mL/min   Anion gap 7 5 - 15    Comment: Performed at [place] [address], [place], [place] [address]  CBG monitoring, ED     Status: Abnormal   Collection Time: 03/10/19  8:04 AM  Result Value Ref Range   Glucose-Capillary 195 (H) 70 - 99 mg/dL   Comment 1 Notify RN    Comment 2 Document in Chart   CBG monitoring, ED     Status: Abnormal   Collection Time: 03/10/19 12:47 PM  Result Value Ref Range   Glucose-Capillary 149 (H) 70 - 99 mg/dL   Dg Chest 1 View  Result Date: 03/10/2019 CLINICAL DATA:  Recent  fall with chest pain, initial encounter EXAM: CHEST  1 VIEW COMPARISON:  05/20/2018 FINDINGS: Cardiac shadow is enlarged. Pacing device is again noted and stable. The lungs are well aerated bilaterally. No focal infiltrate or sizable effusion is seen. No acute bony abnormality is noted. IMPRESSION: No acute abnormality noted. Electronically Signed   By: Inez Catalina M.D.   On: 03/10/2019 00:40   Ct Abdomen Pelvis W Contrast  Addendum Date: 03/10/2019   ADDENDUM REPORT: 03/10/2019 01:56 ADDENDUM: At the level of T12-L1 posteriorly in the subcutaneous soft tissues, there is some increased attenuation identified likely related to underlying bruising although no sizable or expanding hematoma is noted. Electronically Signed   By: Inez Catalina M.D.   On: 03/10/2019 01:56   Result Date: 03/10/2019 CLINICAL DATA:  Recent fall EXAM: CT ABDOMEN AND PELVIS WITH CONTRAST TECHNIQUE: Multidetector CT imaging of the abdomen and pelvis was performed using  the standard protocol following bolus administration of intravenous contrast. CONTRAST:  170m OMNIPAQUE IOHEXOL 300 MG/ML  SOLN COMPARISON:  04/17/2014 FINDINGS: Lower chest: Mild atelectatic changes are noted in the bases. No focal infiltrate is seen. Hepatobiliary: Small gallstone is noted within the gallbladder. The liver is within normal limits. Pancreas: Unremarkable. No pancreatic ductal dilatation or surrounding inflammatory changes. Spleen: Normal in size without focal abnormality. Adrenals/Urinary Tract: The adrenal glands are within normal limits. Kidneys demonstrate normal enhancement pattern bilaterally. Normal excretion is seen. No renal or ureteral calculi are seen. The bladder is decompressed. Stomach/Bowel: No obstructive or inflammatory changes of the colon are seen. The appendix is not well visualized consistent with prior surgical history. No small bowel abnormality is seen. No gastric abnormality is noted. Vascular/Lymphatic: Aortic atherosclerosis. No  enlarged abdominal or pelvic lymph nodes. Reproductive: Status post prostatectomy. Penile prosthesis is noted. Other: No abdominal wall hernia or abnormality. No abdominopelvic ascites. Musculoskeletal: Bilateral femoral prosthesis are seen. Periprosthetic fracture is noted in the proximal right femur similar to that noted on prior plain film. Degenerative changes of the lumbar spine are noted. IMPRESSION: Periprosthetic fracture in the proximal right femur. Cholelithiasis without complicating factors. Chronic changes as described above. Electronically Signed: By: MInez CatalinaM.D. On: 03/10/2019 01:38   Dg Hip Unilat W Or Wo Pelvis 2-3 Views Right  Result Date: 03/10/2019 CLINICAL DATA:  Recent fall with leg pain, initial encounter EXAM: DG HIP (WITH OR WITHOUT PELVIS) 3V RIGHT COMPARISON:  None. FINDINGS: Bilateral hip replacements are noted. Mildly displaced fracture is noted in the proximal shaft of the femur surrounding the prosthesis. Pelvic ring is intact. Degenerative changes of lumbar spine are noted. Postoperative changes consistent with prostate surgery are noted. Penile prosthesis is noted. IMPRESSION: Minimally displaced periprosthetic proximal femoral fracture. Electronically Signed   By: MInez CatalinaM.D.   On: 03/10/2019 00:41    Pending Labs Unresulted Labs (From admission, onward)    Start     Ordered   03/11/19 0500  CBC  Tomorrow morning,   R     03/10/19 0618   03/10/19 0442  Hemoglobin A1c  Once,   STAT    Comments: To assess prior glycemic control    03/10/19 0444          Vitals/Pain Today's Vitals   03/10/19 0745 03/10/19 0824 03/10/19 1410 03/10/19 1413  BP: 114/72  (!) 112/56   Pulse: 63  68   Resp: (!) 27     Temp:      TempSrc:      SpO2: 94%     Weight:      Height:      PainSc:  0-No pain  7     Isolation Precautions No active isolations  Medications Medications  morphine 2 MG/ML injection 1 mg (1 mg Intravenous Given 03/10/19 0442)  atorvastatin  (LIPITOR) tablet 20 mg (has no administration in time range)  carvedilol (COREG) tablet 3.125 mg (3.125 mg Oral Given 03/10/19 1410)  fenofibrate tablet 160 mg (has no administration in time range)  vitamin B-12 (CYANOCOBALAMIN) tablet 1,000 mcg (has no administration in time range)  gabapentin (NEURONTIN) capsule 300 mg (has no administration in time range)  multivitamin with minerals tablet 1 tablet (has no administration in time range)  guaiFENesin (MUCINEX) 12 hr tablet 600 mg (has no administration in time range)  ipratropium (ATROVENT) nebulizer solution 0.5 mg (0.5 mg Nebulization Not Given 03/10/19 1128)  insulin aspart (novoLOG) injection 0-9 Units (2 Units Subcutaneous  Given 03/10/19 0821)  iohexol (OMNIPAQUE) 300 MG/ML solution 100 mL (100 mLs Intravenous Contrast Given 03/10/19 0113)    Mobility walks High fall risk   Focused Assessments Ortho   R Recommendations: See Admitting Provider Note  Report given to:   Additional Notes: right hip shortened and rotated.

## 2019-03-10 NOTE — ED Provider Notes (Signed)
[facility] EMERGENCY DEPARTMENT Provider Note   CSN: [id number] Arrival date & time: 03/09/19  2333     History   Chief Complaint Chief Complaint  Patient presents with   Fall   Hip Pain    HPI Oscar Baker is a 85 y.o. male.     The history is provided by the patient and the spouse.  Fall This is a new problem. The current episode started 1 to 2 hours ago. The problem occurs constantly. The problem has not changed since onset.Pertinent negatives include no chest pain, no abdominal pain and no headaches. The symptoms are aggravated by walking. The symptoms are relieved by rest.  Hip Pain Pertinent negatives include no chest pain, no abdominal pain and no headaches.  Patient with extensive history including atrial fibrillation on Coumadin, CHF, CLL, CAD presents after fall.  Patient reports he uses a walker at baseline.  He reports he lost his balance tonight and fell down.  He reports he has a large hematoma to his back, as well as pain in his right hip.  He thought he may have dislocated his hip, but after movement he feels that it has been relocated. Before the fall, he felt at his baseline. He denies hitting his head, no LOC, no nausea or vomiting  Past Medical History:  Diagnosis Date   Asthma 1950's   history of   Atrial fibrillation (HCC)    Bilateral carpal tunnel syndrome    Bilateral lower extremity edema    Bladder tumor    Chronic systolic heart failure ([place])    Echo 1/19: Mild LVH, EF 45-50, inf HK, MAC, severe LAE, severe RAE // Echo 7/15: Mild LVH, mod focal basal sept hypertrophy, EF 55-60, AV peak and mean 16/9, trivial MR, mod LAE, PASP 38   CLL (chronic lymphocytic leukemia) [facility]) oncologist-  dr Ilene Qua--   dx 01-2014 ;  Lymphocytosis, CLL - per lov note 05-11-2017 currently under active survillance,  CT 04-17-2014 show very small lymphadenopathy, no indication for treatment   Coronary artery disease    cardiologist-  dr  Cathie Olden--  08-18-2017 Intermittant risk nuclear study w/ large area of inferior infartion with no evidence ishcemia    Deafness in right ear    Diabetes mellitus type 2, noninsulin dependent (HCC)    Elevated PSA    since prostatectomy but now resolved   Hematuria 04/2017   History of ear infection    Right   History of MI (myocardial infarction)    per myoview nuclear study 08-18-2017 , unknown when   History of shingles 08/2017   L ear and scalp, possible   Hyperlipidemia    Hypertension    Ischemic cardiomyopathy 09/01/2017   Presumed +CAD with Nuclear stress test 08/18/17 - Inferior scar, no ischemia, intermediate risk // med management unless +angina or worse dyspnea   OA (osteoarthritis)    Pacemaker 02/08/2014   followed by dr g. taylor--  single chamber Biotronik due to SSS   Permanent atrial fibrillation    Pneumonia 2019   Left lung   Prostate cancer [facility]) urologist-  dr Diona Fanti   dx 2004--  Gleason 8, PSA 10.45--  11-28-2002  s/p  radical prostatectomy;  recurrent w/ increasing PSA, started ADT treatment   RBBB (right bundle branch block)    Sick sinus syndrome ([place])    a-Flutter with episodes of bradycardia; S/P Biotronik (serial number [id number]) 02-08-2014   Urinary incontinence    Wears hearing aid in  right ear    receiver and transmitter    Patient Active Problem List   Diagnosis Date Noted   Chronic bronchitis with productive mucopurulent cough ([place]) 35/57/3220   Acute systolic heart failure ([employer]) 08/02/2018   Type 2 diabetes mellitus ([place]) 08/02/2018   Pain in joint of right shoulder 06/08/2018   CAD (coronary artery disease) 05/21/2018   Asthma exacerbation 05/21/2018   Aspiration pneumonia ([place]) 05/21/2018   Elevated INR    Shoulder strain, right, initial encounter    Acute respiratory failure with hypoxia ([place]) 05/20/2018   Bilateral impacted cerumen 02/14/2018   Asymmetric SNHL (sensorineural hearing loss) 02/14/2018    Encounter for therapeutic drug monitoring 12/20/2017   S/P thoracotomy 11/29/2017   Sepsis ([place])    Hypoxemia    Pneumonia of left lower lobe due to infectious organism [facility])    Mediastinal lymphadenopathy    Chronic cough 11/23/2017   CLL (chronic lymphocytic leukemia) ([place]) 11/23/2017   Pleural effusion 11/23/2017   HCAP (healthcare-associated pneumonia) 11/20/2017   Anemia 11/20/2017   Mixed conductive and sensorineural hearing loss of right ear with restricted hearing of left ear 09/14/2017   Abnormal hearing test 09/06/2017   Cellulitis 09/06/2017   Lymphadenitis, acute 09/06/2017   Ischemic cardiomyopathy 09/01/2017   S/p bilateral revision of total hip arthroplasty 08/11/2017   History of revision of total replacement of left hip joint 08/11/2017   Right hip pain    Acute otitis media    Chronic systolic CHF (congestive heart failure) ([place])    Pressure injury of skin 07/20/2017   Pressure ulcer 07/20/2017   Malignant otitis externa 07/19/2017   Fall in home 07/19/2017   Acute on chronic diastolic heart failure ([place]) 07/19/2017   S/P left THA, AA 05/12/2016   History of repair of hip joint 05/12/2016   Laryngopharyngeal reflux 05/21/2015   Allergic rhinitis 05/21/2015   Obesity 12/12/2014   Upper airway cough syndrome 12/11/2014   Posterior rhinorrhea 12/11/2014   Cough variant asthma 07/27/2014   Wheezing 06/20/2014   Symptomatic bradycardia - s/p Biotronik (serial number [id number]) 02/08/2014   Bradycardia 02/08/2014   Diabetes mellitus type 2, noninsulin dependent ([place])    Mixed incontinence 03/09/2013   Edema of foot 03/06/2013   Traumatic ecchymosis of right foot 03/06/2013   Contusion of foot 03/06/2013   Malignant neoplasm of prostate ([place]) 02/15/2013   Diabetes mellitus without complication ([place]) 37/62/8315   Bone spur 12/30/2012   Pain of toe of left foot 12/30/2012   Hyperlipidemia 07/13/2012   Hypertensive  disorder 04/12/2012   Sick sinus syndrome ([place]) 08/17/2011   PAF (paroxysmal atrial fibrillation) ([place]) 09/16/2010    Past Surgical History:  Procedure Laterality Date   APPENDECTOMY     BACK SURGERY     disk   CARDIAC CATHETERIZATION  09-03-1999  dr Cathie Olden   abnormal cardiolite study:  minor luminal irregularities but no critial coronary artery stenosis   CARDIOVASCULAR STRESS TEST  08-18-2017  dr Cathie Olden   Intermediate risk nuclear study w/ large area inferior infarction, no evidence of ishcemia (consistant w/ prior MI)/  study not gated due to frequent PVCs   CARPAL TUNNEL RELEASE Right 2000   CARPAL TUNNEL RELEASE Left 11/19/2009   CATARACT EXTRACTION Right 07/2015   CATARACT EXTRACTION Left 09/2015   CYSTOSCOPY N/A 11/04/2017   Procedure: CYSTOSCOPY AND CAUTERIZATION OF BLADDER;  Surgeon: Franchot Gallo, MD;  Location[facility];  Service: Urology;  Laterality: N/A;   INSERTION PENILE PROSTHESIS  02-22-2004  dr Mattie Marlin  [facility]   IR THORACENTESIS ASP PLEURAL SPACE W/IMG GUIDE  11/26/2017   KNEE ARTHROSCOPY Left 07/2010   PERMANENT PACEMAKER INSERTION N/A 02/08/2014   Procedure: PERMANENT PACEMAKER INSERTION;  Surgeon: Evans Lance, MD;  Location[facility] CATH LAB;  Service: Cardiovascular;  Laterality: N/A;   PLEURAL EFFUSION DRAINAGE Left 11/30/2017   Procedure: DRAINAGE OF HEMOTHORAX;  Surgeon: Ivin Poot, MD;  Location: [place];  Service: Thoracic;  Laterality: Left;   RADICAL RETROPUBIC PROSTATECTOMY W/ BILATERAL PELVIC LYMPH NODE DISSECTION  11-28-2002   dr Mattie Marlin  [facility]   TONSILLECTOMY     TOTAL HIP ARTHROPLASTY Left 05/12/2016   Procedure: LEFT TOTAL HIP ARTHROPLASTY ANTERIOR APPROACH;  Surgeon: Paralee Cancel, MD;  Location: [facility];  Service: Orthopedics;  Laterality: Left;   TOTAL HIP ARTHROPLASTY Right 07-15-2006   dr Alvan Dame  [facility]   TRANSTHORACIC ECHOCARDIOGRAM  07/20/2017   mild LVH, ef 45-50%, hypokinesis of the basal-midinferior  myocardium, due to AFib unable to evaluate diastolic function/  severe LAE and RAE/  trivial PR and TR   VIDEO ASSISTED THORACOSCOPY Left 11/30/2017   Procedure: VIDEO ASSISTED THORACOSCOPY;  Surgeon: Ivin Poot, MD;  Location: [place];  Service: Thoracic;  Laterality: Left;   VIDEO ASSISTED THORACOSCOPY (VATS)/EMPYEMA Left 11/29/2017   Procedure: VIDEO ASSISTED THORACOSCOPY (VATS)/EMPYEMA;  Surgeon: Ivin Poot, MD;  Location: [place];  Service: Thoracic;  Laterality: Left;        Home Medications    Prior to Admission medications   Medication Sig Start Date End Date Taking? Authorizing Provider  acetaminophen (TYLENOL) 500 MG tablet Take 2 tablets (1,000 mg total) by mouth every 6 (six) hours. 12/05/17   Elgie Collard, PA-C  Aspirin Buf,CaCarb-MgCarb-MgO, 81 MG TABS aspirin 81 mg tablet   81 mg by oral route.    [provider]  atorvastatin (LIPITOR) 20 MG tablet TAKE ONE TABLET EACH DAY AT 6PM 03/07/18   Nahser, Wonda Cheng, MD  carvedilol (COREG) 3.125 MG tablet TAKE ONE TABLET TWICE DAILY 09/07/18   Evans Lance, MD  fenofibrate micronized (LOFIBRA) 200 MG capsule TAKE ONE CAPSULE EACH DAY BEFORE BREAKFAST 09/07/18   Nahser, Wonda Cheng, MD  furosemide (LASIX) 20 MG tablet TAKE ONE TABLET DAILY AS NEEDED 09/07/18   Evans Lance, MD  gabapentin (NEURONTIN) 300 MG capsule Take 1 capsule (300 mg total) by mouth 3 (three) times daily. 01/30/19   Olalere, Adewale A, MD  glipiZIDE (GLUCOTROL XL) 2.5 MG 24 hr tablet Take 2.5 mg by mouth daily with breakfast.    [provider]  guaiFENesin (MUCINEX) 600 MG 12 hr tablet Take 600 mg by mouth as needed.     [provider]  HYDROcodone-homatropine (HYCODAN) 5-1.5 MG/5ML syrup Take 5 mLs by mouth every 6 (six) hours as needed for cough. 06/23/18   Martyn Ehrich, NP  ipratropium (ATROVENT) 0.02 % nebulizer solution TAKE 2.38m BY NEBULIZER 3 TIMES DAILY 03/01/19   Olalere, Adewale A, MD  metFORMIN (GLUCOPHAGE) 1000 MG  tablet Take 1,000 mg by mouth 2 (two) times daily with a meal.      [provider]  Multiple Vitamin (MULTI-VITAMIN) tablet Take by mouth.    [provider]  mupirocin ointment (BACTROBAN) 2 %  08/31/18   [provider]  Nebulizers (VIOS LC SPRINT DELUXE) MISC Use 1 each every 6 (six) hours as needed (shortness of breath or wheezing) [facility]   [provider]  sitaGLIPtin (JANUVIA) 100 MG tablet Take 100 mg by mouth daily.    [provider]  sodium chloride HYPERTONIC 3 % nebulizer solution Take by nebulization 3 (three) times daily. Patient not taking: Reported on 01/30/2019 09/20/18   Fenton Foy, NP  vitamin B-12 (CYANOCOBALAMIN) 1000 MCG tablet Take 1,000 mcg by mouth daily. Will stop prior to procedure    [provider]  warfarin (COUMADIN) 5 MG tablet TAKE AS DIRECTED BY COUMADIN CLINIC 01/09/19   Evans Lance, MD    Family History Family History  Problem Relation Age of Onset   Emphysema Mother    Allergic rhinitis Mother    Heart attack Father    Allergic rhinitis Brother    Pulmonary fibrosis Brother     Social History Social History   Tobacco Use   Smoking status: Former Smoker    Years: 25.00    Types: Pipe, Cigars    Quit date: 11/25/1975    Years since quitting: 43.3   Smokeless tobacco: Never Used  Substance Use Topics   Alcohol use: Yes    Alcohol/week: 0.0 standard drinks    Comment: occasional   Drug use: No     Allergies   Altace [ramipril], Augmentin [amoxicillin-pot clavulanate], Codeine, and Simvastatin   Review of Systems Review of Systems  Constitutional: Negative for fever.  Cardiovascular: Negative for chest pain.  Gastrointestinal: Negative for abdominal pain and vomiting.  Musculoskeletal: Positive for arthralgias.  Skin: Positive for wound.  Neurological: Negative for syncope and headaches.  All other systems reviewed and are negative.    Physical  Exam Updated Vital Signs BP 137/74 (BP Location: Right Arm)    Pulse 82    Temp 98.3 F (36.8 C) (Oral)    Resp 18    Ht 1.676 m (_0 )    Wt 92.5 kg    SpO2 96%    BMI 32.93 kg/m   Physical Exam CONSTITUTIONAL: Elderly, hard of hearing HEAD: Normocephalic/atraumatic, no signs of trauma EYES: EOMI ENMT: Mucous membranes moist NECK: supple no meningeal signs SPINE/BACK:entire spine nontender, nexus criteria met, no bruising/crepitance/stepoffs noted to spine Large hematoma adjacent to the spine.  See photo CV: S1/S2 noted, no murmurs/rubs/gallops noted LUNGS: Coarse breath sounds in the bases, no distress ABDOMEN: soft, nontender, no rebound or guarding, bowel sounds noted throughout abdomen GU: Condom catheter in place NEURO: Pt is awake/alert/appropriate, moves all extremitiesx4.  No facial droop.   EXTREMITIES: pulses normal/equal, full ROM, pelvis stable, no obvious deformities to right lower extremity.  Distal pulses intact Patient has tenderness with range of motion of right hip All other extremities/joints palpated/ranged and nontender SKIN: warm, color normal, patient has bandage to right forearm. PSYCH: no abnormalities of mood noted, alert and oriented to situation    Patient gave verbal permission to utilize photo for medical documentation only The image was not stored on any personal device ED Treatments / Results  Labs (all labs ordered are listed, but only abnormal results are displayed) Labs Reviewed  BASIC METABOLIC PANEL - Abnormal; Notable for the following components:      Result Value   Glucose, Bld 164 (*)    All other components within normal limits  CBC WITH DIFFERENTIAL/PLATELET - Abnormal; Notable for the following components:   WBC 33.7 (*)    RBC 3.71 (*)    Hemoglobin 12.2 (*)    HCT 37.7 (*)    MCV 101.6 (*)    RDW 18.9 (*)    Lymphs  Abs 22.5 (*)    Monocytes Absolute 4.1 (*)    Abs Immature Granulocytes 0.17 (*)    All other components  within normal limits  PROTIME-INR - Abnormal; Notable for the following components:   Prothrombin Time 36.7 (*)    INR 3.8 (*)    All other components within normal limits  SARS CORONAVIRUS 2 (TAT 6-24 HRS)  TYPE AND SCREEN    EKG EKG Interpretation  Date/Time:  Friday March 10 2019 01:48:15 EDT Ventricular Rate:  65 PR Interval:    QRS Duration: 178 QT Interval:  493 QTC Calculation: 513 R Axis:   -116 Text Interpretation:  Atrial flutter Right bundle branch block Anterolateral infarct, old No significant change since last tracing Confirmed by Ripley Fraise [phone]) on 03/10/2019 1:49:59 AM   Radiology Dg Chest 1 View  Result Date: 03/10/2019 CLINICAL DATA:  Recent fall with chest pain, initial encounter EXAM: CHEST  1 VIEW COMPARISON:  05/20/2018 FINDINGS: Cardiac shadow is enlarged. Pacing device is again noted and stable. The lungs are well aerated bilaterally. No focal infiltrate or sizable effusion is seen. No acute bony abnormality is noted. IMPRESSION: No acute abnormality noted. Electronically Signed   By: Inez Catalina M.D.   On: 03/10/2019 00:40   Ct Abdomen Pelvis W Contrast  Addendum Date: 03/10/2019   ADDENDUM REPORT: 03/10/2019 01:56 ADDENDUM: At the level of T12-L1 posteriorly in the subcutaneous soft tissues, there is some increased attenuation identified likely related to underlying bruising although no sizable or expanding hematoma is noted. Electronically Signed   By: Inez Catalina M.D.   On: 03/10/2019 01:56   Result Date: 03/10/2019 CLINICAL DATA:  Recent fall EXAM: CT ABDOMEN AND PELVIS WITH CONTRAST TECHNIQUE: Multidetector CT imaging of the abdomen and pelvis was performed using the standard protocol following bolus administration of intravenous contrast. CONTRAST:  121m OMNIPAQUE IOHEXOL 300 MG/ML  SOLN COMPARISON:  04/17/2014 FINDINGS: Lower chest: Mild atelectatic changes are noted in the bases. No focal infiltrate is seen. Hepatobiliary: Small gallstone  is noted within the gallbladder. The liver is within normal limits. Pancreas: Unremarkable. No pancreatic ductal dilatation or surrounding inflammatory changes. Spleen: Normal in size without focal abnormality. Adrenals/Urinary Tract: The adrenal glands are within normal limits. Kidneys demonstrate normal enhancement pattern bilaterally. Normal excretion is seen. No renal or ureteral calculi are seen. The bladder is decompressed. Stomach/Bowel: No obstructive or inflammatory changes of the colon are seen. The appendix is not well visualized consistent with prior surgical history. No small bowel abnormality is seen. No gastric abnormality is noted. Vascular/Lymphatic: Aortic atherosclerosis. No enlarged abdominal or pelvic lymph nodes. Reproductive: Status post prostatectomy. Penile prosthesis is noted. Other: No abdominal wall hernia or abnormality. No abdominopelvic ascites. Musculoskeletal: Bilateral femoral prosthesis are seen. Periprosthetic fracture is noted in the proximal right femur similar to that noted on prior plain film. Degenerative changes of the lumbar spine are noted. IMPRESSION: Periprosthetic fracture in the proximal right femur. Cholelithiasis without complicating factors. Chronic changes as described above. Electronically Signed: By: MInez CatalinaM.D. On: 03/10/2019 01:38   Dg Hip Unilat W Or Wo Pelvis 2-3 Views Right  Result Date: 03/10/2019 CLINICAL DATA:  Recent fall with leg pain, initial encounter EXAM: DG HIP (WITH OR WITHOUT PELVIS) 3V RIGHT COMPARISON:  None. FINDINGS: Bilateral hip replacements are noted. Mildly displaced fracture is noted in the proximal shaft of the femur surrounding the prosthesis. Pelvic ring is intact. Degenerative changes of lumbar spine are noted. Postoperative changes consistent with prostate surgery  are noted. Penile prosthesis is noted. IMPRESSION: Minimally displaced periprosthetic proximal femoral fracture. Electronically Signed   By: Inez Catalina M.D.    On: 03/10/2019 00:41    Procedures Procedures  Medications Ordered in ED Medications  fentaNYL (SUBLIMAZE) injection 50 mcg (has no administration in time range)  iohexol (OMNIPAQUE) 300 MG/ML solution 100 mL (100 mLs Intravenous Contrast Given 03/10/19 0113)     Initial Impression / Assessment and Plan / ED Course  I have reviewed the triage vital signs and the nursing notes.  Pertinent labs & imaging results that were available during my care of the patient were reviewed by me and considered in my medical decision making (see chart for details).        12:17 AM Patient presents after mechanical fall.  He has some tenderness with right hip range of motion, but no obvious deformities.  He also has large hematoma to his flank.  Will obtain CT imaging to further evaluate the hematoma. He adamantly denies headache, denies head injury, denies LOC.  Will defer CT head 2:04 AM Patient stable at this time. Patient sustained a right periprosthetic fracture.  Discussed with Dr. Alvan Dame with [facility].  They will see patient later in the day.  He would not have immediate intervention.  Patient can eat and drink  Per CT imaging, there is no expanding hematoma or any internal injury. Patient does have elevated INR, but will defer rapid reversal at this time as he is not likely to undergo any surgery immediately Will admit to hospitalist Discussed with Dr. Hal Hope for admission Final Clinical Impressions(s) / ED Diagnoses   Final diagnoses:  Closed nondisplaced transverse fracture of shaft of right femur, initial encounter [facility])  Coagulopathy [facility])    ED Discharge Orders    None       Ripley Fraise, MD 03/10/19 0205

## 2019-03-10 NOTE — Progress Notes (Signed)
Noted skin breakdown on sacral area in between L and R buttocks, cleansed and applied foam dressing on sacrum. Pt stated he's had it for months now. Noted pink but blanchable R and L buttocks during skin assessment.

## 2019-03-10 NOTE — Progress Notes (Signed)
Initial Nutrition Assessment  DOCUMENTATION CODES:   Obesity unspecified  INTERVENTION:   -Continue MVI with minerals daily -Magic cup TID with meals, each supplement provides 290 kcal and 9 grams of protein  NUTRITION DIAGNOSIS:   Increased nutrient needs related to post-op healing as evidenced by estimated needs.  GOAL:   Patient will meet greater than or equal to 90% of their needs  MONITOR:   PO intake, Supplement acceptance, Weight trends, Labs, Skin, I & O's  REASON FOR ASSESSMENT:   Consult Assessment of nutrition requirement/status  ASSESSMENT:   Oscar Baker is a 85 y.o. male with history of atrial fibrillation, pacemaker placement for bradycardia, diastolic dysfunction, CHF, anemia, diabetes mellitus, prostate cancer had a fall at home after tripping.  Fell on backwards and hit chest on the mid back.  Patient denies losing consciousness or having any chest pain shortness of breath palpitation.  Pt admitted with closed rt hip fracture.   Attempted to speak with pt via phone, however, no answer. RD unable to obtain further nutrition-related history at this time.  Pt awaiting orthopedics consult.  Reviewed wt hx; wt has been stable over the past year.   Lab Results  Component Value Date   HGBA1C 6.0 (H) 05/05/2016   PTA DM medications are 2.5 mg glipizide daily, 100 mg januvia daily, and 1000 mg metformin BID.   Labs reviewed: CBGS: 195 (inpatient orders for glycemic control are 0-9 units insulin aspart TID with meals).   Diet Order:   Diet Order            Diet heart healthy/carb modified Room service appropriate? Yes; Fluid consistency: Thin; Fluid restriction: 1200 mL Fluid  Diet effective now              EDUCATION NEEDS:   No education needs have been identified at this time  Skin:  Skin Assessment: Reviewed RN Assessment  Last BM:  Unknown  Height:   Ht Readings from Last 1 Encounters:  03/09/19 5\' 6"  (1.676 m)    Weight:   Wt  Readings from Last 1 Encounters:  03/09/19 92.5 kg    Ideal Body Weight:  64.5 kg  BMI:  Body mass index is 32.93 kg/m.  Estimated Nutritional Needs:   Kcal:  1700-1900  Protein:  90-105 grams  Fluid:  1.2 L    Oscar Baker A. Jimmye Norman, RD, LDN, [place] Registered Dietitian II Certified Diabetes Care and Education Specialist Pager: [phone] After hours Pager: [phone]

## 2019-03-10 NOTE — Progress Notes (Signed)
CSW acknowledges consult for SNF. PT/OT will need to assess the patient. CSW will assist with disposition planning based off of therapy evaluations.   Domenic Schwab, MSW, [place] Worker [facility]  [phone]

## 2019-03-10 NOTE — ED Notes (Signed)
Lunch Tray Ordered @ 1119. 

## 2019-03-10 NOTE — Consult Note (Signed)
Reason for Consult: Right hip pain after fall Referring Physician: Hal Hope, MD  Oscar Baker is an 85 y.o. male.  HPI: Oscar Baker is a 85 y.o. male with history of atrial fibrillation, pacemaker placement for bradycardia, diastolic dysfunction, CHF, anemia, diabetes mellitus, prostate cancer had a fall at home after tripping.  Fell on backwards and hit chest on the mid back.  Patient denies losing consciousness or having any chest pain shortness of breath palpitation.  Complains of right hip girdle pain.  Unable to bear weight after fall. Just getting to room - long night. Pain with movement No other extremity concerns  Past Medical History:  Diagnosis Date  . Asthma 1950's   history of  . Atrial fibrillation ([place])   . Bilateral carpal tunnel syndrome   . Bilateral lower extremity edema   . Bladder tumor   . Chronic systolic heart failure (HCC)    Echo 1/19: Mild LVH, EF 45-50, inf HK, MAC, severe LAE, severe RAE // Echo 7/15: Mild LVH, mod focal basal sept hypertrophy, EF 55-60, AV peak and mean 16/9, trivial MR, mod LAE, PASP 38  . CLL (chronic lymphocytic leukemia) [facility]) oncologist-  dr Ilene Qua--   dx [phone] ;  Lymphocytosis, CLL - per lov note 05-11-2017 currently under active survillance,  CT 04-17-2014 show very small lymphadenopathy, no indication for treatment  . Coronary artery disease    cardiologist-  dr Cathie Olden--  08-18-2017 Intermittant risk nuclear study w/ large area of inferior infartion with no evidence ishcemia   . 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 Diabetes mellitus type 2, noninsulin dependent ([place])   . Elevated PSA    since prostatectomy but now resolved  . Hematuria 04/2017  . History of ear infection    Right  . History of MI (myocardial infarction)    per myoview nuclear study 08-18-2017 , unknown when  . History of shingles 08/2017   L ear and scalp, possible  . Hyperlipidemia   . Hypertension   . Ischemic cardiomyopathy 09/01/2017   Presumed +CAD with  Nuclear stress test 08/18/17 - Inferior scar, no ischemia, intermediate risk // med management unless +angina or worse dyspnea  . OA (osteoarthritis)   . Pacemaker 02/08/2014   followed by dr g. taylor--  single chamber Biotronik due to SSS  . Permanent atrial fibrillation   . Pneumonia 2019   Left lung  . Prostate cancer [facility]) urologist-  dr Diona Fanti   dx 2004--  Gleason 8, PSA 10.45--  11-28-2002  s/p  radical prostatectomy;  recurrent w/ increasing PSA, started ADT treatment  . RBBB (right bundle branch block)   . Sick sinus syndrome ([place])    a-Flutter with episodes of bradycardia; S/P Biotronik (serial number [id number]) 02-08-2014  . Urinary incontinence   . Wears hearing aid in right ear    receiver and transmitter    Past Surgical History:  Procedure Laterality Date  . APPENDECTOMY    . BACK SURGERY     disk  . CARDIAC CATHETERIZATION  09-03-1999  dr Cathie Olden   abnormal cardiolite study:  minor luminal irregularities but no critial coronary artery stenosis  . CARDIOVASCULAR STRESS TEST  08-18-2017  dr Cathie Olden   Intermediate risk nuclear study w/ large area inferior infarction, no evidence of ishcemia (consistant w/ prior MI)/  study not gated due to frequent PVCs  . CARPAL TUNNEL RELEASE Right 2000  . CARPAL TUNNEL RELEASE Left 11/19/2009  . CATARACT EXTRACTION Right 07/2015  . CATARACT EXTRACTION  Left 09/2015  . CYSTOSCOPY N/A 11/04/2017   Procedure: CYSTOSCOPY AND CAUTERIZATION OF BLADDER;  Surgeon: Franchot Gallo, MD;  Location: [facility];  Service: Urology;  Laterality: N/A;  . INSERTION PENILE PROSTHESIS  02-22-2004    dr Mattie Marlin  [facility]  . IR THORACENTESIS ASP PLEURAL SPACE W/IMG GUIDE  11/26/2017  . KNEE ARTHROSCOPY Left 07/2010  . PERMANENT PACEMAKER INSERTION N/A 02/08/2014   Procedure: PERMANENT PACEMAKER INSERTION;  Surgeon: Evans Lance, MD;  Location: [facility] CATH LAB;  Service: Cardiovascular;  Laterality: N/A;  . PLEURAL EFFUSION DRAINAGE Left  11/30/2017   Procedure: DRAINAGE OF HEMOTHORAX;  Surgeon: Ivin Poot, MD;  Location: [place];  Service: Thoracic;  Laterality: Left;  . RADICAL RETROPUBIC PROSTATECTOMY W/ BILATERAL PELVIC LYMPH NODE DISSECTION  11-28-2002   dr Mattie Marlin  [facility]  . TONSILLECTOMY    . TOTAL HIP ARTHROPLASTY Left 05/12/2016   Procedure: LEFT TOTAL HIP ARTHROPLASTY ANTERIOR APPROACH;  Surgeon: Paralee Cancel, MD;  Location: [facility] ORS;  Service: Orthopedics;  Laterality: Left;  . TOTAL HIP ARTHROPLASTY Right 07-15-2006   dr Alvan Dame  [facility]  . TRANSTHORACIC ECHOCARDIOGRAM  07/20/2017   mild LVH, ef 45-50%, hypokinesis of the basal-midinferior myocardium, due to AFib unable to evaluate diastolic function/  severe LAE and RAE/  trivial PR and TR  . VIDEO ASSISTED THORACOSCOPY Left 11/30/2017   Procedure: VIDEO ASSISTED THORACOSCOPY;  Surgeon: Ivin Poot, MD;  Location: [place];  Service: Thoracic;  Laterality: Left;  [name] VIDEO ASSISTED THORACOSCOPY (VATS)/EMPYEMA Left 11/29/2017   Procedure: VIDEO ASSISTED THORACOSCOPY (VATS)/EMPYEMA;  Surgeon: Ivin Poot, MD;  Location[facility] OR;  Service: Thoracic;  Laterality: Left;    Family History  Problem Relation Age of Onset  . Emphysema Mother   . Allergic rhinitis Mother   . Heart attack Father   . Allergic rhinitis Brother   . Pulmonary fibrosis Brother     Social History:  reports that he quit smoking about 43 years ago. His smoking use included pipe and cigars. He quit after 25.00 years of use. He has never used smokeless tobacco. He reports current alcohol use. He reports that he does not use drugs.  Allergies:  Allergies  Allergen Reactions  . Altace [Ramipril] Other (See Comments)    "throat felt like had a knot in it"  . Augmentin [Amoxicillin-Pot Clavulanate] Diarrhea    severe  . Codeine Nausea And Vomiting    Nausea and vomiting   . Simvastatin Other (See Comments)    Leg aches    Medications:  I have reviewed the patient's current  medications. Scheduled: . atorvastatin  20 mg Oral q1800  . carvedilol  3.125 mg Oral BID WC  . fenofibrate  160 mg Oral Daily  . gabapentin  300 mg Oral TID  . insulin aspart  0-9 Units Subcutaneous TID WC  . ipratropium  0.5 mg Nebulization TID  . multivitamin with minerals  1 tablet Oral Daily  . vitamin B-12  1,000 mcg Oral Daily    Results for orders placed or performed during the hospital encounter of 03/09/19 (from the past 24 hour(s))  Basic metabolic panel     Status: Abnormal   Collection Time: 03/09/19 11:57 PM  Result Value Ref Range   Sodium 140 135 - 145 mmol/L   Potassium 4.7 3.5 - 5.1 mmol/L   Chloride 105 98 - 111 mmol/L   CO2 26 22 - 32 mmol/L   Glucose, Bld 164 (H) 70 -  99 mg/dL   BUN 17 8 - 23 mg/dL   Creatinine, Ser 1.03 0.61 - 1.24 mg/dL   Calcium 8.9 8.9 - 10.3 mg/dL   GFR calc non Af Amer >60 >60 mL/min   GFR calc Af Amer >60 >60 mL/min   Anion gap 9 5 - 15  CBC WITH DIFFERENTIAL     Status: Abnormal   Collection Time: 03/09/19 11:57 PM  Result Value Ref Range   WBC 33.7 (H) 4.0 - 10.5 K/uL   RBC 3.71 (L) 4.22 - 5.81 MIL/uL   Hemoglobin 12.2 (L) 13.0 - 17.0 g/dL   HCT 37.7 (L) 39.0 - 52.0 %   MCV 101.6 (H) 80.0 - 100.0 fL   MCH 32.9 26.0 - 34.0 pg   MCHC 32.4 30.0 - 36.0 g/dL   RDW 18.9 (H) 11.5 - 15.5 %   Platelets 255 150 - 400 K/uL   nRBC 0.1 0.0 - 0.2 %   Neutrophils Relative % 19 %   Neutro Abs 6.4 1.7 - 7.7 K/uL   Lymphocytes Relative 67 %   Lymphs Abs 22.5 (H) 0.7 - 4.0 K/uL   Monocytes Relative 12 %   Monocytes Absolute 4.1 (H) 0.1 - 1.0 K/uL   Eosinophils Relative 1 %   Eosinophils Absolute 0.4 0.0 - 0.5 K/uL   Basophils Relative 0 %   Basophils Absolute 0.1 0.0 - 0.1 K/uL   Immature Granulocytes 1 %   Abs Immature Granulocytes 0.17 (H) 0.00 - 0.07 K/uL  Protime-INR     Status: Abnormal   Collection Time: 03/09/19 11:57 PM  Result Value Ref Range   Prothrombin Time 36.7 (H) 11.4 - 15.2 seconds   INR 3.8 (H) 0.8 - 1.2  Type and  screen [place]     Status: None   Collection Time: 03/10/19 12:10 AM  Result Value Ref Range   ABO/RH(D) O POS    Antibody Screen NEG    Sample Expiration      03/13/2019,2359 Performed at [facility], [address] [place], [name] [address]   SARS CORONAVIRUS 2 (TAT 6-24 HRS) Nasopharyngeal Nasopharyngeal Swab     Status: None   Collection Time: 03/10/19  1:53 AM   Specimen: Nasopharyngeal Swab  Result Value Ref Range   SARS Coronavirus 2 NEGATIVE NEGATIVE  Protime-INR     Status: Abnormal   Collection Time: 03/10/19  6:18 AM  Result Value Ref Range   Prothrombin Time 34.6 (H) 11.4 - 15.2 seconds   INR 3.5 (H) 0.8 - 1.2  Basic metabolic panel     Status: Abnormal   Collection Time: 03/10/19  6:18 AM  Result Value Ref Range   Sodium 137 135 - 145 mmol/L   Potassium 4.6 3.5 - 5.1 mmol/L   Chloride 105 98 - 111 mmol/L   CO2 25 22 - 32 mmol/L   Glucose, Bld 188 (H) 70 - 99 mg/dL   BUN 17 8 - 23 mg/dL   Creatinine, Ser 0.96 0.61 - 1.24 mg/dL   Calcium 8.6 (L) 8.9 - 10.3 mg/dL   GFR calc non Af Amer >60 >60 mL/min   GFR calc Af Amer >60 >60 mL/min   Anion gap 7 5 - 15  CBG monitoring, ED     Status: Abnormal   Collection Time: 03/10/19  8:04 AM  Result Value Ref Range   Glucose-Capillary 195 (H) 70 - 99 mg/dL   Comment 1 Notify RN    Comment 2 Document  in Chart   CBG monitoring, ED     Status: Abnormal   Collection Time: 03/10/19 12:47 PM  Result Value Ref Range   Glucose-Capillary 149 (H) 70 - 99 mg/dL    X-ray: CLINICAL DATA:  Recent fall with leg pain, initial encounter  EXAM: DG HIP (WITH OR WITHOUT PELVIS) 3V RIGHT  COMPARISON:  None.  FINDINGS: Bilateral hip replacements are noted. Mildly displaced fracture is noted in the proximal shaft of the femur surrounding the prosthesis. Pelvic ring is intact. Degenerative changes of lumbar spine are noted. Postoperative changes consistent with prostate surgery are noted. Penile  prosthesis is noted.  IMPRESSION: Minimally displaced periprosthetic proximal femoral fracture.   Electronically Signed   By: Inez Catalina M.D.  ROS As per HPI, rest all negative.   Blood pressure (!) 122/52, pulse 71, temperature 98.3 F (36.8 C), temperature source Oral, resp. rate (!) 23, height _0  (1.676 m), weight 92.5 kg, SpO2 92 %.  Physical Exam  Awake alert Wife with [place] during discussion Right leg painful to move - holding flexed and externally rotated Extensive bruising  Assessment/Plan: Non displaced right [place] B peri-prosthetic femur fracture with stable prosthesis, no evidence of subsidence Elevated INR - 3.8  Plan: Observe for now ICE to right hip Reduce INR to less than 2 in case OR deemed a necessary treatment  PT - assess function with restriction of TDWB RLE  Will reassess for possible need for OR Sunday  Mauri Pole 03/10/2019, 3:27 PM

## 2019-03-10 NOTE — ED Notes (Signed)
Scanner is broken

## 2019-03-11 LAB — CBC
HCT: 30.5 % — ABNORMAL LOW (ref 39.0–52.0)
Hemoglobin: 10 g/dL — ABNORMAL LOW (ref 13.0–17.0)
MCH: 32.6 pg (ref 26.0–34.0)
MCHC: 32.8 g/dL (ref 30.0–36.0)
MCV: 99.3 fL (ref 80.0–100.0)
Platelets: 247 10*3/uL (ref 150–400)
RBC: 3.07 MIL/uL — ABNORMAL LOW (ref 4.22–5.81)
RDW: 18.9 % — ABNORMAL HIGH (ref 11.5–15.5)
WBC: 31 10*3/uL — ABNORMAL HIGH (ref 4.0–10.5)
nRBC: 0 % (ref 0.0–0.2)

## 2019-03-11 LAB — GLUCOSE, CAPILLARY
Glucose-Capillary: 194 mg/dL — ABNORMAL HIGH (ref 70–99)
Glucose-Capillary: 244 mg/dL — ABNORMAL HIGH (ref 70–99)
Glucose-Capillary: 200 mg/dL — ABNORMAL HIGH (ref 70–99)
Glucose-Capillary: 195 mg/dL — ABNORMAL HIGH (ref 70–99)

## 2019-03-11 LAB — HEMOGLOBIN A1C
Hgb A1c MFr Bld: 6.4 % — ABNORMAL HIGH (ref 4.8–5.6)
Mean Plasma Glucose: 137 mg/dL

## 2019-03-11 LAB — PROTIME-INR
INR: 2.5 — ABNORMAL HIGH (ref 0.8–1.2)
Prothrombin Time: 26.8 seconds — ABNORMAL HIGH (ref 11.4–15.2)

## 2019-03-11 NOTE — Progress Notes (Signed)
PROGRESS NOTE    [name]  [id number] DOB: July 17, 1932 DOA: 03/09/2019 PCP: [name], MD   Brief Narrative:  Oscar Baker is a 85 y.o. male with history of atrial fibrillation, pacemaker placement for bradycardia, diastolic dysfunction, CHF, anemia, diabetes mellitus, prostate cancer who had a fall at home after tripping.  Fell on backwards and hit chest on the mid back.  Patient denied losing consciousness or having any chest pain shortness of breath palpitation.  In the ER x-rays and CT scan reveals a right hip periprosthetic fracture.  On-call orthopedic surgeon Dr. Alvan Dame was consulted.  On exam patient has mid back bruising CAT scan revealed mildly small hematoma which is not enlarging.  EKG shows atrial flutter rate controlled.  Labs reveal leukocytosis from known CLL mild anemia otherwise largely unremarkable.  INR is 3.8.  Coag negative.  He was admitted under hospitalist service.  Assessment & Plan:   Principal Problem:   Closed right hip fracture, initial encounter ([place]) Active Problems:   PAF (paroxysmal atrial fibrillation) ([place])   Diabetes mellitus type 2, noninsulin dependent (HCC)   Laryngopharyngeal reflux   CLL (chronic lymphocytic leukemia) (HCC)   Type 2 diabetes mellitus ([place])  Right-sided periprosthetic hip fracture: Decisions regarding surgery versus no surgery is deferred to orthopedic surgeon.  His INR is only 2.5.  If there is a plan for surgery tomorrow then we can check his INR tomorrow and if it is not less than 2 (which I expect it will be) we can always use FFP to bring it down to accommodate orthopedics request.  Please let us know if there is needed.  Pain management per orthopedics.  Patient states that he was told by orthopedics that he will get cold packsBut he has not received any.  I spoke to the primary RN and asked to accommodate patient's request.  History of atrial fibrillation status post pacemaker placement: Rate is controlled.  INR  2.5.  Holding Coumadin in order to bring INR less than 2 to facilitate potential surgery. Continue coreg.   CLL: Leukocytosis is stable.  Hyperlipidemia: Continue statin.  Type 2 diabetes mellitus: Blood sugar control.  Continue SSI.  DVT prophylaxis: None for now.  INR therapeutic. Code Status: DNR Family Communication: Plan of care discussed with patient and his wife at the bedside in length and he verbalized understanding and agreed with it.  Deferred questions regarding his surgery to orthopedics. Disposition Plan: TBD  Consultants:   Orthopedics  Procedures:   None  Antimicrobials:   None   Subjective: Seen and examined.  Wife at the bedside.  Hip pain controlled.  Family tells me that they have been told that they might get surgery or might not get.  They also tell me that they were told that physical therapy will see them.  Objective: Vitals:   03/10/19 2347 03/11/19 0256 03/11/19 0806 03/11/19 0913  BP: (!) 112/54 (!) 118/59 119/81   Pulse: 79 61 72   Resp:   16   Temp: 98.4 F (36.9 C) 98 F (36.7 C) 98.6 F (37 C)   TempSrc: Axillary Oral Oral   SpO2: 92% 91% 94% 90%  Weight:      Height:        Intake/Output Summary (Last 24 hours) at 03/11/2019 1108 Last data filed at 03/10/2019 1700 Gross per 24 hour  Intake 240 ml  Output 1000 ml  Net -760 ml   Filed Weights   03/09/19 2341  Weight: 92.5 kg  Examination:  General exam: Appears calm and comfortable, morbidly obese Respiratory system: Clear to auscultation. Respiratory effort normal. Cardiovascular system: S1 & S2 heard, RRR. No JVD, murmurs, rubs, gallops or clicks. No pedal edema. Gastrointestinal system: Abdomen is nondistended, soft and nontender. No organomegaly or masses felt. Normal bowel sounds heard. Central nervous system: Alert and oriented. No focal neurological deficits. Extremities: Symmetric 5 x 5 power in all extremities except right lower extremity. Skin: No rashes, lesions  or ulcers Psychiatry: Judgement and insight appear normal. Mood & affect appropriate.    Data Reviewed: I have personally reviewed following labs and imaging studies  CBC: Recent Labs  Lab 03/09/19 2357 03/11/19 0448  WBC 33.7* 31.0*  NEUTROABS 6.4  --   HGB 12.2* 10.0*  HCT 37.7* 30.5*  MCV 101.6* 99.3  PLT 255 [id number]   Basic Metabolic Panel: Recent Labs  Lab 03/09/19 2357 03/10/19 0618  NA 140 137  K 4.7 4.6  CL 105 105  CO2 26 25  GLUCOSE 164* 188*  BUN 17 17  CREATININE 1.03 0.96  CALCIUM 8.9 8.6*   GFR: Estimated Creatinine Clearance: 61 mL/min (by C-G formula based on SCr of 0.96 mg/dL). Liver Function Tests: No results for input(s): AST, ALT, ALKPHOS, BILITOT, PROT, ALBUMIN in the last 168 hours. No results for input(s): LIPASE, AMYLASE in the last 168 hours. No results for input(s): AMMONIA in the last 168 hours. Coagulation Profile: Recent Labs  Lab 03/09/19 2357 03/10/19 0618 03/11/19 0955  INR 3.8* 3.5* 2.5*   Cardiac Enzymes: No results for input(s): CKTOTAL, CKMB, CKMBINDEX, TROPONINI in the last 168 hours. BNP (last 3 results) No results for input(s): PROBNP in the last 8760 hours. HbA1C: Recent Labs    03/10/19 0554  HGBA1C 6.4*   CBG: Recent Labs  Lab 03/10/19 0804 03/10/19 1247 03/10/19 1834 03/10/19 2120 03/11/19 0634  GLUCAP 195* 149* 156* 187* 194*   Lipid Profile: No results for input(s): CHOL, HDL, LDLCALC, TRIG, CHOLHDL, LDLDIRECT in the last 72 hours. Thyroid Function Tests: No results for input(s): TSH, T4TOTAL, FREET4, T3FREE, THYROIDAB in the last 72 hours. Anemia Panel: No results for input(s): VITAMINB12, FOLATE, FERRITIN, TIBC, IRON, RETICCTPCT in the last 72 hours. Sepsis Labs: No results for input(s): PROCALCITON, LATICACIDVEN in the last 168 hours.  Recent Results (from the past 240 hour(s))  SARS CORONAVIRUS 2 (TAT 6-24 HRS) Nasopharyngeal Nasopharyngeal Swab     Status: None   Collection Time: 03/10/19  1:53  AM   Specimen: Nasopharyngeal Swab  Result Value Ref Range Status   SARS Coronavirus 2 NEGATIVE NEGATIVE Final    Comment: (NOTE) SARS-CoV-2 target nucleic acids are NOT DETECTED. The SARS-CoV-2 RNA is generally detectable in upper and lower respiratory specimens during the acute phase of infection. Negative results do not preclude SARS-CoV-2 infection, do not rule out co-infections with other pathogens, and should not be used as the sole basis for treatment or other patient management decisions. Negative results must be combined with clinical observations, patient history, and epidemiological information. The expected result is Negative. Fact Sheet for Patients: SugarRoll.be Fact Sheet for Healthcare Providers: https://www.woods-mathews.com/ This test is not yet approved or cleared by the [place] FDA and  has been authorized for detection and/or diagnosis of SARS-CoV-2 by FDA under an Emergency Use Authorization (EUA). This EUA will remain  in effect (meaning this test can be used) for the duration of the COVID-19 declaration under Section 56 4(b)(1) of the Act, 21 U.S.C. section 360bbb-3(b)(1), unless the authorization  is terminated or revoked sooner. Performed at [facility], [place] [address], [place], [place] [address]   Surgical pcr screen     Status: Abnormal   Collection Time: 03/10/19  3:39 PM   Specimen: Nasal Mucosa; Nasal Swab  Result Value Ref Range Status   MRSA, PCR NEGATIVE NEGATIVE Final   Staphylococcus aureus POSITIVE (A) NEGATIVE Final    Comment: (NOTE) The Xpert SA Assay (FDA approved for NASAL specimens in patients 4 years of age and older), is one component of a comprehensive surveillance program. It is not intended to diagnose infection nor to guide or monitor treatment. Performed at [facility], [place] [address] [place], [place] [address]       Radiology Studies: Dg Chest 1 View  Result  Date: 03/10/2019 CLINICAL DATA:  Recent fall with chest pain, initial encounter EXAM: CHEST  1 VIEW COMPARISON:  05/20/2018 FINDINGS: Cardiac shadow is enlarged. Pacing device is again noted and stable. The lungs are well aerated bilaterally. No focal infiltrate or sizable effusion is seen. No acute bony abnormality is noted. IMPRESSION: No acute abnormality noted. Electronically Signed   By: Inez Catalina M.D.   On: 03/10/2019 00:40   Ct Abdomen Pelvis W Contrast  Addendum Date: 03/10/2019   ADDENDUM REPORT: 03/10/2019 01:56 ADDENDUM: At the level of T12-L1 posteriorly in the subcutaneous soft tissues, there is some increased attenuation identified likely related to underlying bruising although no sizable or expanding hematoma is noted. Electronically Signed   By: Inez Catalina M.D.   On: 03/10/2019 01:56   Result Date: 03/10/2019 CLINICAL DATA:  Recent fall EXAM: CT ABDOMEN AND PELVIS WITH CONTRAST TECHNIQUE: Multidetector CT imaging of the abdomen and pelvis was performed using the standard protocol following bolus administration of intravenous contrast. CONTRAST:  143mL OMNIPAQUE IOHEXOL 300 MG/ML  SOLN COMPARISON:  04/17/2014 FINDINGS: Lower chest: Mild atelectatic changes are noted in the bases. No focal infiltrate is seen. Hepatobiliary: Small gallstone is noted within the gallbladder. The liver is within normal limits. Pancreas: Unremarkable. No pancreatic ductal dilatation or surrounding inflammatory changes. Spleen: Normal in size without focal abnormality. Adrenals/Urinary Tract: The adrenal glands are within normal limits. Kidneys demonstrate normal enhancement pattern bilaterally. Normal excretion is seen. No renal or ureteral calculi are seen. The bladder is decompressed. Stomach/Bowel: No obstructive or inflammatory changes of the colon are seen. The appendix is not well visualized consistent with prior surgical history. No small bowel abnormality is seen. No gastric abnormality is noted.  Vascular/Lymphatic: Aortic atherosclerosis. No enlarged abdominal or pelvic lymph nodes. Reproductive: Status post prostatectomy. Penile prosthesis is noted. Other: No abdominal wall hernia or abnormality. No abdominopelvic ascites. Musculoskeletal: Bilateral femoral prosthesis are seen. Periprosthetic fracture is noted in the proximal right femur similar to that noted on prior plain film. Degenerative changes of the lumbar spine are noted. IMPRESSION: Periprosthetic fracture in the proximal right femur. Cholelithiasis without complicating factors. Chronic changes as described above. Electronically Signed: By: Inez Catalina M.D. On: 03/10/2019 01:38   Dg Hip Unilat W Or Wo Pelvis 2-3 Views Right  Result Date: 03/10/2019 CLINICAL DATA:  Recent fall with leg pain, initial encounter EXAM: DG HIP (WITH OR WITHOUT PELVIS) 3V RIGHT COMPARISON:  None. FINDINGS: Bilateral hip replacements are noted. Mildly displaced fracture is noted in the proximal shaft of the femur surrounding the prosthesis. Pelvic ring is intact. Degenerative changes of lumbar spine are noted. Postoperative changes consistent with prostate surgery are noted. Penile prosthesis is noted. IMPRESSION: Minimally  displaced periprosthetic proximal femoral fracture. Electronically Signed   By: Inez Catalina M.D.   On: 03/10/2019 00:41    Scheduled Meds:  atorvastatin  20 mg Oral q1800   carvedilol  3.125 mg Oral BID WC   fenofibrate  160 mg Oral Daily   gabapentin  300 mg Oral TID   insulin aspart  0-9 Units Subcutaneous TID WC   ipratropium  0.5 mg Nebulization TID   multivitamin with minerals  1 tablet Oral Daily   vitamin B-12  1,000 mcg Oral Daily   Continuous Infusions:   LOS: 1 day   Time spent: 32 minutes  Darliss Cheney, MD [facility] Pager [phone]  If 7PM-7AM, please contact night-coverage [email or web address] Password [email or web address] 03/11/2019, 11:08 AM

## 2019-03-11 NOTE — Progress Notes (Signed)
Pt able to rest quietly t/o the night after getting trazadone. He has not asked for pain meds tonite. VSS, call light within reach

## 2019-03-11 NOTE — Plan of Care (Signed)
POC Reviewed

## 2019-03-11 NOTE — Evaluation (Signed)
Physical Therapy Evaluation Patient Details Name: Oscar Baker MRN: [id number] DOB: 04-Jul-1932 Today's Date: 03/11/2019   History of Present Illness  85 yo male with onset of trip at home resulting in fall and fracture of R femur adjacent to prosthetic components of hip.  Pt is TDWB and MD is making a surgical decision tomorrow.  PMHx:  R BBB, shingles, HTN, pacemaker, CHF, DM, prostate and bladder CA, MI, PAF, CLL  Clinical Impression  Pt was able to be assisted to side of bed and then could be helped to try to shift on bed.  Could not stand up but is in pain and fearful of falling again.  Will continue to see pt for his balance and fear of mobility, to try standing and work on alternate transfer skills.  Pt is likely not going to get home directly from hosp due to his loss of independence and painful movement.  See to shorten his rehab stay in SNF.     Follow Up Recommendations SNF;Supervision for mobility/OOB;Supervision/Assistance - 24 hour    Equipment Recommendations  None recommended by PT    Recommendations for Other Services       Precautions / Restrictions Precautions Precautions: Fall Precaution Comments: premedicate Restrictions Weight Bearing Restrictions: Yes Other Position/Activity Restrictions: TDWB but should be NWB      Mobility  Bed Mobility Overal bed mobility: Needs Assistance Bed Mobility: Supine to Sit;Sit to Supine     Supine to sit: Mod assist Sit to supine: Max assist;+2 for physical assistance;+2 for safety/equipment   General bed mobility comments: max to pivot and move legs together and assist trunk  Transfers Overall transfer level: Needs assistance               General transfer comment: attempted to stand without success, and could not scoot without max assist and help to maintain NWB on RLE  Ambulation/Gait             General Gait Details: unable  Stairs            Wheelchair Mobility    Modified Rankin (Stroke  Patients Only)       Balance Overall balance assessment: History of Falls;Needs assistance Sitting-balance support: Feet supported;Bilateral upper extremity supported(R foot supported in NWB by PT) Sitting balance-Leahy Scale: Fair                                       Pertinent Vitals/Pain Pain Assessment: 0-10 Pain Score: 8  Pain Location: R hip and leg Pain Descriptors / Indicators: Grimacing;Heaviness;Guarding Pain Intervention(s): Limited activity within patient's tolerance;Monitored during session;Premedicated before session;Repositioned    Home Living Family/patient expects to be discharged to:: Skilled nursing facility Living Arrangements: Spouse/significant other Available Help at Discharge: Family;Available PRN/intermittently Type of Home: Apartment Home Access: Level entry     Home Layout: One level Home Equipment: Grab bars - toilet;Grab bars - tub/shower;Walker - 2 wheels Additional Comments: Pt is demonstrating poor control of RLE despite previous level of I    Prior Function Level of Independence: Independent with assistive device(s)         Comments: RW at home with pt being able to go out in community prev     Hand Dominance        Extremity/Trunk Assessment   Upper Extremity Assessment Upper Extremity Assessment: Overall WFL for tasks assessed    Lower Extremity Assessment Lower  Extremity Assessment: Generalized weakness    Cervical / Trunk Assessment Cervical / Trunk Assessment: Kyphotic  Communication   Communication: HOH  Cognition Arousal/Alertness: Awake/alert Behavior During Therapy: WFL for tasks assessed/performed Overall Cognitive Status: Within Functional Limits for tasks assessed                                 General Comments: pt able to answer questions about the injruy      General Comments General comments (skin integrity, edema, etc.): pt is up to side of bed with mod assist including  sliding on bed pad, then back with PT and nurse assisting legs together and trrunk    Exercises     Assessment/Plan    PT Assessment Patient needs continued PT services  PT Problem List Decreased range of motion;Decreased strength;Decreased activity tolerance;Decreased balance;Decreased mobility;Decreased coordination;Decreased cognition;Decreased knowledge of use of DME;Decreased safety awareness;Cardiopulmonary status limiting activity;Decreased knowledge of precautions;Obesity;Decreased skin integrity;Pain       PT Treatment Interventions DME instruction;Gait training;Stair training;Functional mobility training;Therapeutic activities;Therapeutic exercise;Balance training;Neuromuscular re-education;Patient/family education    PT Goals (Current goals can be found in the Care Plan section)  Acute Rehab PT Goals Patient Stated Goal: to get home and be able to walk again PT Goal Formulation: With patient Time For Goal Achievement: 03/25/19 Potential to Achieve Goals: Good    Frequency Min 2X/week   Barriers to discharge Inaccessible home environment;Decreased caregiver support has stairs at home and insufficient help    Co-evaluation               AM-PAC PT "6 Clicks" Mobility  Outcome Measure Help needed turning from your back to your side while in a flat bed without using bedrails?: A Lot Help needed moving from lying on your back to sitting on the side of a flat bed without using bedrails?: A Lot Help needed moving to and from a bed to a chair (including a wheelchair)?: A Lot Help needed standing up from a chair using your arms (e.g., wheelchair or bedside chair)?: Total Help needed to walk in hospital room?: Total Help needed climbing 3-5 steps with a railing? : Total 6 Click Score: 9    End of Session Equipment Utilized During Treatment: Gait belt Activity Tolerance: Patient limited by fatigue;Patient limited by pain Patient left: in bed;with call bell/phone within  reach;with bed alarm set Nurse Communication: Mobility status PT Visit Diagnosis: Unsteadiness on feet (R26.81);Muscle weakness (generalized) (M62.81);History of falling (Z91.81)    Time: [id number] PT Time Calculation (min) (ACUTE ONLY): 29 min   Charges:   PT Evaluation $PT Eval Moderate Complexity: 1 Mod PT Treatments $Therapeutic Activity: 8-22 mins       Ramond Dial 03/11/2019, 4:23 PM   Mee Hives, PT MS Acute Rehab Dept. Number: [place]

## 2019-03-12 LAB — GLUCOSE, CAPILLARY
Glucose-Capillary: 212 mg/dL — ABNORMAL HIGH (ref 70–99)
Glucose-Capillary: 213 mg/dL — ABNORMAL HIGH (ref 70–99)
Glucose-Capillary: 258 mg/dL — ABNORMAL HIGH (ref 70–99)
Glucose-Capillary: 300 mg/dL — ABNORMAL HIGH (ref 70–99)

## 2019-03-12 LAB — BASIC METABOLIC PANEL
Anion gap: 7 (ref 5–15)
BUN: 22 mg/dL (ref 8–23)
CO2: 24 mmol/L (ref 22–32)
Calcium: 8.5 mg/dL — ABNORMAL LOW (ref 8.9–10.3)
Chloride: 102 mmol/L (ref 98–111)
Creatinine, Ser: 0.94 mg/dL (ref 0.61–1.24)
GFR calc Af Amer: >60 mL/min (ref >60)
GFR calc non Af Amer: >60 mL/min (ref >60)
Glucose, Bld: 278 mg/dL — ABNORMAL HIGH (ref 70–99)
Potassium: 5 mmol/L (ref 3.5–5.1)
Sodium: 133 mmol/L — ABNORMAL LOW (ref 135–145)

## 2019-03-12 LAB — CBC WITH DIFFERENTIAL/PLATELET
Abs Immature Granulocytes: 0.32 10*3/uL — ABNORMAL HIGH (ref 0.00–0.07)
Basophils Absolute: 0.1 10*3/uL (ref 0.0–0.1)
Basophils Relative: 0 %
Eosinophils Absolute: 0.1 10*3/uL (ref 0.0–0.5)
Eosinophils Relative: 0 %
HCT: 30.9 % — ABNORMAL LOW (ref 39.0–52.0)
Hemoglobin: 10.1 g/dL — ABNORMAL LOW (ref 13.0–17.0)
Immature Granulocytes: 1 %
Lymphocytes Relative: 63 %
Lymphs Abs: 22.1 10*3/uL — ABNORMAL HIGH (ref 0.7–4.0)
MCH: 32.4 pg (ref 26.0–34.0)
MCHC: 32.7 g/dL (ref 30.0–36.0)
MCV: 99 fL (ref 80.0–100.0)
Monocytes Absolute: 4.9 10*3/uL — ABNORMAL HIGH (ref 0.1–1.0)
Monocytes Relative: 14 %
Neutro Abs: 7.7 10*3/uL (ref 1.7–7.7)
Neutrophils Relative %: 22 %
Platelets: 273 10*3/uL (ref 150–400)
RBC: 3.12 MIL/uL — ABNORMAL LOW (ref 4.22–5.81)
RDW: 18.7 % — ABNORMAL HIGH (ref 11.5–15.5)
WBC: 35.3 10*3/uL — ABNORMAL HIGH (ref 4.0–10.5)
nRBC: 0.1 % (ref 0.0–0.2)

## 2019-03-12 LAB — PROTIME-INR
INR: 1.8 — ABNORMAL HIGH (ref 0.8–1.2)
Prothrombin Time: 20.7 seconds — ABNORMAL HIGH (ref 11.4–15.2)

## 2019-03-12 MED ORDER — IPRATROPIUM BROMIDE 0.02 % IN SOLN: 0.5000 mg | Freq: Four times a day (QID) | RESPIRATORY_TRACT | Status: DC | PRN

## 2019-03-12 NOTE — Progress Notes (Signed)
Pt nebs dc'd at this time based on pt condition and protocol assessment.  RT will continue to monitor.

## 2019-03-12 NOTE — Progress Notes (Signed)
PROGRESS NOTE    Oscar Baker  [id number] DOB: 07-Aug-1932 DOA: 03/09/2019 PCP: [name], MD   Brief Narrative:  Oscar Baker is a 85 y.o. male with history of atrial fibrillation, pacemaker placement for bradycardia, diastolic dysfunction, CHF, anemia, diabetes mellitus, prostate cancer who had a fall at home after tripping.  Fell on backwards and hit chest on the mid back.  Patient denied losing consciousness or having any chest pain shortness of breath palpitation.  In the ER x-rays and CT scan reveals a right hip periprosthetic fracture.  On-call orthopedic surgeon Dr. Alvan Dame was consulted.  On exam patient has mid back bruising CAT scan revealed mildly small hematoma which is not enlarging.  EKG shows atrial flutter rate controlled.  Labs reveal leukocytosis from known CLL mild anemia otherwise largely unremarkable.  INR is 3.8.  Coag negative.  He was admitted under hospitalist service.  Assessment & Plan:   Principal Problem:   Closed right hip fracture, initial encounter ([place]) Active Problems:   PAF (paroxysmal atrial fibrillation) ([place])   Diabetes mellitus type 2, noninsulin dependent (HCC)   Laryngopharyngeal reflux   CLL (chronic lymphocytic leukemia) (HCC)   Type 2 diabetes mellitus ([place])  Right-sided periprosthetic hip fracture: He states that his pain is controlled.  Note from orthopedics reviewed.  Looks like he is planned to have surgical repair done on Tuesday afternoon.  His INR is 1.8.  Pain management and rest of the management per orthopedics.    History of atrial fibrillation status post pacemaker placement: Rate is controlled.  INR 1.8.  Holding Coumadin in order to bring INR less than 2.  Now that he still has 48 hours until he has his surgery so I will start him on heparin for prevention of thromboembolism without a bolus and will consult pharmacy for that.  CLL: Leukocytosis is stable.  Hyperlipidemia: Continue statin.  Type 2 diabetes mellitus: Blood  sugar control.  Continue SSI.  DVT prophylaxis: None for now.  INR therapeutic. Code Status: DNR Family Communication: Plan of care discussed with patient at bedside.  No family present. Disposition Plan: TBD  Consultants:   Orthopedics  Procedures:   None  Antimicrobials:   None   Subjective: Patient seen and examined.  Feels better than yesterday.  Pain controlled.  Worked with physical therapy.  Objective: Vitals:   03/11/19 1626 03/11/19 1957 03/11/19 2101 03/12/19 0432  BP: (!) 120/59 129/67  119/66  Pulse: 66 67  69  Resp: 18     Temp: 98.9 F (37.2 C) 98.4 F (36.9 C)  97.8 F (36.6 C)  TempSrc: Oral Axillary  Oral  SpO2: 90% 92% 93% 90%  Weight:      Height:        Intake/Output Summary (Last 24 hours) at 03/12/2019 1323 Last data filed at 03/11/2019 1631 Gross per 24 hour  Intake 114 ml  Output 850 ml  Net -736 ml   Filed Weights   03/09/19 2341  Weight: 92.5 kg    Examination:  General exam: Appears calm and comfortable, obese Respiratory system: Clear to auscultation. Respiratory effort normal. Cardiovascular system: S1 & S2 heard, RRR. No JVD, murmurs, rubs, gallops or clicks. No pedal edema. Gastrointestinal system: Abdomen is nondistended, soft and nontender. No organomegaly or masses felt. Normal bowel sounds heard. Central nervous system: Alert and oriented. No focal neurological deficits. Extremities: Symmetric 5 x 5 power except right lower extremity. Skin: No rashes, lesions or ulcers.  Psychiatry: Judgement and insight  appear normal. Mood & affect appropriate.    Data Reviewed: I have personally reviewed following labs and imaging studies  CBC: Recent Labs  Lab 03/09/19 2357 03/11/19 0448 03/12/19 0927  WBC 33.7* 31.0* 35.3*  NEUTROABS 6.4  --  7.7  HGB 12.2* 10.0* 10.1*  HCT 37.7* 30.5* 30.9*  MCV 101.6* 99.3 99.0  PLT 255 247 [id number]   Basic Metabolic Panel: Recent Labs  Lab 03/09/19 2357 03/10/19 0618 03/12/19 0927   NA 140 137 133*  K 4.7 4.6 5.0  CL 105 105 102  CO2 26 25 24   GLUCOSE 164* 188* 278*  BUN 17 17 22   CREATININE 1.03 0.96 0.94  CALCIUM 8.9 8.6* 8.5*   GFR: Estimated Creatinine Clearance: 62.3 mL/min (by C-G formula based on SCr of 0.94 mg/dL). Liver Function Tests: No results for input(s): AST, ALT, ALKPHOS, BILITOT, PROT, ALBUMIN in the last 168 hours. No results for input(s): LIPASE, AMYLASE in the last 168 hours. No results for input(s): AMMONIA in the last 168 hours. Coagulation Profile: Recent Labs  Lab 03/09/19 2357 03/10/19 0618 03/11/19 0955 03/12/19 0927  INR 3.8* 3.5* 2.5* 1.8*   Cardiac Enzymes: No results for input(s): CKTOTAL, CKMB, CKMBINDEX, TROPONINI in the last 168 hours. BNP (last 3 results) No results for input(s): PROBNP in the last 8760 hours. HbA1C: Recent Labs    03/10/19 0554  HGBA1C 6.4*   CBG: Recent Labs  Lab 03/11/19 1248 03/11/19 1628 03/11/19 2208 03/12/19 0630 03/12/19 1156  GLUCAP 244* 200* 195* 212* 213*   Lipid Profile: No results for input(s): CHOL, HDL, LDLCALC, TRIG, CHOLHDL, LDLDIRECT in the last 72 hours. Thyroid Function Tests: No results for input(s): TSH, T4TOTAL, FREET4, T3FREE, THYROIDAB in the last 72 hours. Anemia Panel: No results for input(s): VITAMINB12, FOLATE, FERRITIN, TIBC, IRON, RETICCTPCT in the last 72 hours. Sepsis Labs: No results for input(s): PROCALCITON, LATICACIDVEN in the last 168 hours.  Recent Results (from the past 240 hour(s))  SARS CORONAVIRUS 2 (TAT 6-24 HRS) Nasopharyngeal Nasopharyngeal Swab     Status: None   Collection Time: 03/10/19  1:53 AM   Specimen: Nasopharyngeal Swab  Result Value Ref Range Status   SARS Coronavirus 2 NEGATIVE NEGATIVE Final    Comment: (NOTE) SARS-CoV-2 target nucleic acids are NOT DETECTED. The SARS-CoV-2 RNA is generally detectable in upper and lower respiratory specimens during the acute phase of infection. Negative results do not preclude SARS-CoV-2  infection, do not rule out co-infections with other pathogens, and should not be used as the sole basis for treatment or other patient management decisions. Negative results must be combined with clinical observations, patient history, and epidemiological information. The expected result is Negative. Fact Sheet for Patients: SugarRoll.be Fact Sheet for Healthcare Providers: https://www.woods-mathews.com/ This test is not yet approved or cleared by the [place] FDA and  has been authorized for detection and/or diagnosis of SARS-CoV-2 by FDA under an Emergency Use Authorization (EUA). This EUA will remain  in effect (meaning this test can be used) for the duration of the COVID-19 declaration under Section 56 4(b)(1) of the Act, 21 U.S.C. section 360bbb-3(b)(1), unless the authorization is terminated or revoked sooner. Performed at [facility], [place] [address] [place], [place] [address]   Surgical pcr screen     Status: Abnormal   Collection Time: 03/10/19  3:39 PM   Specimen: Nasal Mucosa; Nasal Swab  Result Value Ref Range Status   MRSA, PCR NEGATIVE NEGATIVE Final   Staphylococcus aureus POSITIVE (A) NEGATIVE  Final    Comment: (NOTE) The Xpert SA Assay (FDA approved for NASAL specimens in patients 10 years of age and older), is one component of a comprehensive surveillance program. It is not intended to diagnose infection nor to guide or monitor treatment. Performed at [facility], [place] [address], [place], [place] [address]       Radiology Studies: No results found.  Scheduled Meds: . atorvastatin  20 mg Oral q1800  . carvedilol  3.125 mg Oral BID WC  . fenofibrate  160 mg Oral Daily  . gabapentin  300 mg Oral TID  . insulin aspart  0-9 Units Subcutaneous TID WC  . ipratropium  0.5 mg Nebulization TID  . multivitamin with minerals  1 tablet Oral Daily  . vitamin B-12  1,000 mcg Oral Daily   Continuous  Infusions:   LOS: 2 days   Time spent: 27 minutes  Oscar [place], MD [facility] Pager [phone]  If 7PM-7AM, please contact night-coverage [email or web address] Password [email or web address] 03/12/2019, 1:23 PM

## 2019-03-12 NOTE — Plan of Care (Signed)
  Problem: Pain Managment: Goal: General experience of comfort will improve Outcome: Progressing   

## 2019-03-12 NOTE — Progress Notes (Signed)
Patient ID: Oscar Baker, male   DOB: 03-31-1933, 85 y.o.   MRN: [id number] Subjective:       Patient reports pain as moderate. No OOB activity yesterday due to pain INR still elevated - 2.5  Objective:   VITALS:   Vitals:   03/11/19 2101 03/12/19 0432  BP:  119/66  Pulse:  69  Resp:    Temp:  97.8 F (36.6 C)  SpO2: 93% 90%    Neurovascular intact  RLE externally rotated, pain with movement  LABS Recent Labs    03/09/19 2357 03/11/19 0448  HGB 12.2* 10.0*  HCT 37.7* 30.5*  WBC 33.7* 31.0*  PLT 255 247    Recent Labs    03/09/19 2357 03/10/19 0618  NA 140 137  K 4.7 4.6  BUN 17 17  CREATININE 1.03 0.96  GLUCOSE 164* 188*    Recent Labs    03/10/19 0618 03/11/19 0955  INR 3.5* 2.5*     Assessment/Plan: Right periprosthetic femur fracture with stable prosthesis [place] B1    Plan: After further review and due to pain effecting mobility I feel that performing surgery to stabilize fracture with cables will assist with pain control and thus mobility Continue to monitor INR I will at this point plan to take him to the OR Tuesday pm as long as INR <2.0

## 2019-03-13 LAB — BASIC METABOLIC PANEL
Anion gap: 9 (ref 5–15)
BUN: 24 mg/dL — ABNORMAL HIGH (ref 8–23)
CO2: 24 mmol/L (ref 22–32)
Calcium: 8.5 mg/dL — ABNORMAL LOW (ref 8.9–10.3)
Chloride: 100 mmol/L (ref 98–111)
Creatinine, Ser: 0.96 mg/dL (ref 0.61–1.24)
GFR calc Af Amer: >60 mL/min (ref >60)
GFR calc non Af Amer: >60 mL/min (ref >60)
Glucose, Bld: 276 mg/dL — ABNORMAL HIGH (ref 70–99)
Potassium: 4.9 mmol/L (ref 3.5–5.1)
Sodium: 133 mmol/L — ABNORMAL LOW (ref 135–145)

## 2019-03-13 LAB — CBC WITH DIFFERENTIAL/PLATELET
Band Neutrophils: 1 %
Basophils Absolute: 0 10*3/uL (ref 0.0–0.1)
Basophils Relative: 0 %
Blasts: 0 %
Eosinophils Absolute: 0 10*3/uL (ref 0.0–0.5)
Eosinophils Relative: 0 %
HCT: 29.5 % — ABNORMAL LOW (ref 39.0–52.0)
Hemoglobin: 10 g/dL — ABNORMAL LOW (ref 13.0–17.0)
Lymphocytes Relative: 60 %
Lymphs Abs: 22.5 10*3/uL — ABNORMAL HIGH (ref 0.7–4.0)
MCH: 33.1 pg (ref 26.0–34.0)
MCHC: 33.9 g/dL (ref 30.0–36.0)
MCV: 97.7 fL (ref 80.0–100.0)
Metamyelocytes Relative: 0 %
Monocytes Absolute: 1.1 10*3/uL — ABNORMAL HIGH (ref 0.1–1.0)
Monocytes Relative: 3 %
Myelocytes: 0 %
Neutro Abs: 10.9 10*3/uL — ABNORMAL HIGH (ref 1.7–7.7)
Neutrophils Relative %: 28 %
Other: 8 %
Platelets: 263 10*3/uL (ref 150–400)
Promyelocytes Relative: 0 %
RBC: 3.02 MIL/uL — ABNORMAL LOW (ref 4.22–5.81)
RDW: 18.4 % — ABNORMAL HIGH (ref 11.5–15.5)
WBC: 37.5 10*3/uL — ABNORMAL HIGH (ref 4.0–10.5)
nRBC: 0 /100 WBC
nRBC: 0.2 % (ref 0.0–0.2)

## 2019-03-13 LAB — HEPARIN LEVEL (UNFRACTIONATED): Heparin Unfractionated: 0.15 [IU]/mL — ABNORMAL LOW (ref 0.30–0.70)

## 2019-03-13 LAB — GLUCOSE, CAPILLARY
Glucose-Capillary: 243 mg/dL — ABNORMAL HIGH (ref 70–99)
Glucose-Capillary: 275 mg/dL — ABNORMAL HIGH (ref 70–99)
Glucose-Capillary: 304 mg/dL — ABNORMAL HIGH (ref 70–99)
Glucose-Capillary: 328 mg/dL — ABNORMAL HIGH (ref 70–99)

## 2019-03-13 LAB — PROTIME-INR
INR: 1.5 — ABNORMAL HIGH (ref 0.8–1.2)
Prothrombin Time: 17.8 seconds — ABNORMAL HIGH (ref 11.4–15.2)

## 2019-03-13 MED ORDER — HEPARIN (PORCINE) 25000 UT/250ML-% IV SOLN
1400.0000 [IU]/h | INTRAVENOUS | Status: DC
  Administered 2019-03-13: 12:00:00 1150 [IU]/h via INTRAVENOUS
  Administered 2019-03-14: 09:00:00 1400 [IU]/h via INTRAVENOUS
  Filled 2019-03-13 (×2): qty 250

## 2019-03-13 MED ORDER — TRANEXAMIC ACID-NACL 1000-0.7 MG/100ML-% IV SOLN
1000.0000 mg | INTRAVENOUS | Status: DC
  Filled 2019-03-13: qty 100

## 2019-03-13 MED ORDER — DEXAMETHASONE SODIUM PHOSPHATE 10 MG/ML IJ SOLN
8.0000 mg | Freq: Once | INTRAMUSCULAR | Status: AC
  Administered 2019-03-13: 8 mg via INTRAVENOUS
  Filled 2019-03-13: qty 1

## 2019-03-13 MED ORDER — ENSURE PRE-SURGERY PO LIQD
296.0000 mL | Freq: Once | ORAL | Status: DC
  Filled 2019-03-13: qty 296

## 2019-03-13 MED ORDER — CHLORHEXIDINE GLUCONATE CLOTH 2 % EX PADS
6.0000 | MEDICATED_PAD | Freq: Every day | CUTANEOUS | Status: DC
  Administered 2019-03-14 – 2019-03-20 (×7): 6 via TOPICAL

## 2019-03-13 MED ORDER — MUPIROCIN 2 % EX OINT
TOPICAL_OINTMENT | Freq: Two times a day (BID) | CUTANEOUS | Status: DC
  Administered 2019-03-13 – 2019-03-14 (×2): via NASAL
  Administered 2019-03-14: 1 via NASAL
  Administered 2019-03-15 (×2): via NASAL
  Administered 2019-03-16: 1 via NASAL
  Administered 2019-03-16 – 2019-03-20 (×8): via NASAL
  Filled 2019-03-13 (×2): qty 22

## 2019-03-13 MED ORDER — POVIDONE-IODINE 10 % EX SWAB: 2.0000 "application " | Freq: Once | CUTANEOUS | Status: DC

## 2019-03-13 MED ORDER — CEFAZOLIN SODIUM-DEXTROSE 2-4 GM/100ML-% IV SOLN
2.0000 g | INTRAVENOUS | Status: DC
  Filled 2019-03-13: qty 100

## 2019-03-13 MED ORDER — INSULIN GLARGINE 100 UNIT/ML ~~LOC~~ SOLN
15.0000 [IU] | Freq: Every day | SUBCUTANEOUS | Status: DC
  Administered 2019-03-13 – 2019-03-14 (×2): 15 [IU] via SUBCUTANEOUS
  Filled 2019-03-13 (×2): qty 0.15

## 2019-03-13 MED ORDER — CHLORHEXIDINE GLUCONATE 4 % EX LIQD: 60.0000 mL | Freq: Once | CUTANEOUS | Status: DC

## 2019-03-13 NOTE — Progress Notes (Signed)
PROGRESS NOTE    [name]  [id number] DOB: [birth year] DOA: 03/09/2019 PCP: [name], MD   Brief Narrative:  Oscar Baker is a 85 y.o. male with history of atrial fibrillation, pacemaker placement for bradycardia, diastolic dysfunction, CHF, anemia, diabetes mellitus, prostate cancer who had a fall at home after tripping.  Fell on backwards and hit chest on the mid back.  Patient denied losing consciousness or having any chest pain shortness of breath palpitation.  In the ER x-rays and CT scan reveals a right hip periprosthetic fracture.  On-call orthopedic surgeon Dr. Alvan Dame was consulted.  On exam patient has mid back bruising CAT scan revealed mildly small hematoma which is not enlarging.  EKG shows atrial flutter rate controlled.  Labs reveal leukocytosis from known CLL mild anemia otherwise largely unremarkable.  INR is 3.8.  Coag negative.  He was admitted under hospitalist service.  Assessment & Plan:   Principal Problem:   Closed right hip fracture, initial encounter ([place]) Active Problems:   PAF (paroxysmal atrial fibrillation) ([place])   Diabetes mellitus type 2, noninsulin dependent (HCC)   Laryngopharyngeal reflux   CLL (chronic lymphocytic leukemia) (HCC)   Type 2 diabetes mellitus ([place])  Right-sided periprosthetic hip fracture: He states that his pain is controlled.  Note from orthopedics reviewed.  Looks like he is planned to have surgical repair done on Tuesday afternoon.  His INR is 1.5.  Pain management and rest of the management per orthopedics.    History of atrial fibrillation status post pacemaker placement: Rate is controlled.  INR 1.5.  Holding Coumadin in order to bring INR less than 2.  We will start him on heparin drip for anticoagulation until surgery.  CLL: Leukocytosis is stable.  Hyperlipidemia: Continue statin.  Type 2 diabetes mellitus: Blood sugar control.  Continue SSI.  DVT prophylaxis: None for now.  INR therapeutic. Code Status: DNR  Family Communication: Plan of care discussed with patient at bedside.  No family present. Disposition Plan: TBD  Consultants:   Orthopedics  Procedures:   None  Antimicrobials:   None   Subjective: Patient seen and examined.  Right hip pain well controlled.  No complaints.  Had bowel movement yesterday.  Passing gas.  Tolerating diet.  Objective: Vitals:   03/12/19 1502 03/12/19 1941 03/13/19 0512 03/13/19 0807  BP: (!) 123/59 123/87 113/69 (!) 133/92  Pulse: 69 68 68 75  Resp:    17  Temp: 99 F (37.2 C) 98.8 F (37.1 C) 98.2 F (36.8 C) 98.4 F (36.9 C)  TempSrc: Oral Oral Oral Oral  SpO2: 90% 99% 90% 92%  Weight:      Height:        Intake/Output Summary (Last 24 hours) at 03/13/2019 1040 Last data filed at 03/13/2019 0810 Gross per 24 hour  Intake 840 ml  Output 800 ml  Net 40 ml   Filed Weights   03/09/19 2341  Weight: 92.5 kg    Examination:  General exam: Appears calm and comfortable, obese Respiratory system: Clear to auscultation. Respiratory effort normal. Cardiovascular system: S1 & S2 heard, RRR. No JVD, murmurs, rubs, gallops or clicks. No pedal edema. Gastrointestinal system: Abdomen is nondistended, soft and nontender. No organomegaly or masses felt. Normal bowel sounds heard. Central nervous system: Alert and oriented. No focal neurological deficits. Extremities: Symmetric 5 x 5 power except right lower extremity. Skin: No rashes, lesions or ulcers.  Psychiatry: Judgement and insight appear normal. Mood & affect appropriate.   Data Reviewed:  I have personally reviewed following labs and imaging studies  CBC: Recent Labs  Lab 03/09/19 2357 03/11/19 0448 03/12/19 0927 03/13/19 0511  WBC 33.7* 31.0* 35.3* 37.5*  NEUTROABS 6.4  --  7.7 PENDING  HGB 12.2* 10.0* 10.1* 10.0*  HCT 37.7* 30.5* 30.9* 29.5*  MCV 101.6* 99.3 99.0 97.7  PLT 255 247 273 [id number]   Basic Metabolic Panel: Recent Labs  Lab 03/09/19 2357 03/10/19 0618 03/12/19  0927 03/13/19 0511  NA 140 137 133* 133*  K 4.7 4.6 5.0 4.9  CL 105 105 102 100  CO2 26 25 24 24   GLUCOSE 164* 188* 278* 276*  BUN 17 17 22  24*  CREATININE 1.03 0.96 0.94 0.96  CALCIUM 8.9 8.6* 8.5* 8.5*   GFR: Estimated Creatinine Clearance: 61 mL/min (by C-G formula based on SCr of 0.96 mg/dL). Liver Function Tests: No results for input(s): AST, ALT, ALKPHOS, BILITOT, PROT, ALBUMIN in the last 168 hours. No results for input(s): LIPASE, AMYLASE in the last 168 hours. No results for input(s): AMMONIA in the last 168 hours. Coagulation Profile: Recent Labs  Lab 03/09/19 2357 03/10/19 0618 03/11/19 0955 03/12/19 0927 03/13/19 0511  INR 3.8* 3.5* 2.5* 1.8* 1.5*   Cardiac Enzymes: No results for input(s): CKTOTAL, CKMB, CKMBINDEX, TROPONINI in the last 168 hours. BNP (last 3 results) No results for input(s): PROBNP in the last 8760 hours. HbA1C: No results for input(s): HGBA1C in the last 72 hours. CBG: Recent Labs  Lab 03/12/19 0630 03/12/19 1156 03/12/19 1632 03/12/19 2048 03/13/19 0646  GLUCAP 212* 213* 258* 300* 243*   Lipid Profile: No results for input(s): CHOL, HDL, LDLCALC, TRIG, CHOLHDL, LDLDIRECT in the last 72 hours. Thyroid Function Tests: No results for input(s): TSH, T4TOTAL, FREET4, T3FREE, THYROIDAB in the last 72 hours. Anemia Panel: No results for input(s): VITAMINB12, FOLATE, FERRITIN, TIBC, IRON, RETICCTPCT in the last 72 hours. Sepsis Labs: No results for input(s): PROCALCITON, LATICACIDVEN in the last 168 hours.  Recent Results (from the past 240 hour(s))  SARS CORONAVIRUS 2 (TAT 6-24 HRS) Nasopharyngeal Nasopharyngeal Swab     Status: None   Collection Time: 03/10/19  1:53 AM   Specimen: Nasopharyngeal Swab  Result Value Ref Range Status   SARS Coronavirus 2 NEGATIVE NEGATIVE Final    Comment: (NOTE) SARS-CoV-2 target nucleic acids are NOT DETECTED. The SARS-CoV-2 RNA is generally detectable in upper and lower respiratory specimens  during the acute phase of infection. Negative results do not preclude SARS-CoV-2 infection, do not rule out co-infections with other pathogens, and should not be used as the sole basis for treatment or other patient management decisions. Negative results must be combined with clinical observations, patient history, and epidemiological information. The expected result is Negative. Fact Sheet for Patients: SugarRoll.be Fact Sheet for Healthcare Providers: https://www.woods-mathews.com/ This test is not yet approved or cleared by the [place] FDA and  has been authorized for detection and/or diagnosis of SARS-CoV-2 by FDA under an Emergency Use Authorization (EUA). This EUA will remain  in effect (meaning this test can be used) for the duration of the COVID-19 declaration under Section 56 4(b)(1) of the Act, 21 U.S.C. section 360bbb-3(b)(1), unless the authorization is terminated or revoked sooner. Performed at [facility], [place] [address], [place], [place] [address]   Surgical pcr screen     Status: Abnormal   Collection Time: 03/10/19  3:39 PM   Specimen: Nasal Mucosa; Nasal Swab  Result Value Ref Range Status   MRSA, PCR NEGATIVE NEGATIVE  Final   Staphylococcus aureus POSITIVE (A) NEGATIVE Final    Comment: (NOTE) The Xpert SA Assay (FDA approved for NASAL specimens in patients 18 years of age and older), is one component of a comprehensive surveillance program. It is not intended to diagnose infection nor to guide or monitor treatment. Performed at [facility], [place] [address] [place], [place] [address]       Radiology Studies: No results found.  Scheduled Meds: . atorvastatin  20 mg Oral q1800  . carvedilol  3.125 mg Oral BID WC  . fenofibrate  160 mg Oral Daily  . gabapentin  300 mg Oral TID  . insulin aspart  0-9 Units Subcutaneous TID WC  . insulin glargine  15 Units Subcutaneous Daily  . multivitamin with  minerals  1 tablet Oral Daily  . vitamin B-12  1,000 mcg Oral Daily   Continuous Infusions: . heparin       LOS: 3 days   Time spent: 25 minutes  Darliss [place], MD [facility] Pager [phone]  If 7PM-7AM, please contact night-coverage [email or web address] Password [email or web address] 03/13/2019, 10:40 AM

## 2019-03-13 NOTE — Progress Notes (Addendum)
ANTICOAGULATION CONSULT NOTE - Initial Consult  Pharmacy Consult for Heparin Indication: atrial fibrillation  Allergies  Allergen Reactions  . Altace [Ramipril] Other (See Comments)    "throat felt like had a knot in it"  . Augmentin [Amoxicillin-Pot Clavulanate] Diarrhea    severe  . Codeine Nausea And Vomiting    Nausea and vomiting   . Simvastatin Other (See Comments)    Leg aches    Patient Measurements: Height: _0  (167.6 cm) Weight: 204 lb (92.5 kg) IBW/kg (Calculated) : 63.8 Heparin Dosing Weight: 85 kg  - prior heights listed as 5'10", patient reports 56 inches. Estimate 66-68 inches tall  Vital Signs: Temp: 98.4 F (36.9 C) (09/21 0807) Temp Source: Oral (09/21 0807) BP: 133/92 (09/21 0807) Pulse Rate: 75 (09/21 0807)  Labs: Recent Labs    03/11/19 0448 03/11/19 0955 03/12/19 0927 03/13/19 0511  HGB 10.0*  --  10.1* 10.0*  HCT 30.5*  --  30.9* 29.5*  PLT 247  --  273 263  LABPROT  --  26.8* 20.7* 17.8*  INR  --  2.5* 1.8* 1.5*  CREATININE  --   --  0.94 0.96    Estimated Creatinine Clearance: 61 mL/min (by C-G formula based on SCr of 0.96 mg/dL).   Medical History: Past Medical History:  Diagnosis Date  . Asthma 1950's   history of  . Atrial fibrillation ([place])   . Bilateral carpal tunnel syndrome   . Bilateral lower extremity edema   . Bladder tumor   . Chronic systolic heart failure (HCC)    Echo 1/19: Mild LVH, EF 45-50, inf HK, MAC, severe LAE, severe RAE // Echo 7/15: Mild LVH, mod focal basal sept hypertrophy, EF 55-60, AV peak and mean 16/9, trivial MR, mod LAE, PASP 38  . CLL (chronic lymphocytic leukemia) [facility] oncologist-  dr Ilene Qua--   dx [phone] ;  Lymphocytosis, CLL - per lov note 05-11-2017 currently under active survillance,  CT 04-17-2014 show very small lymphadenopathy, no indication for treatment  . Coronary artery disease    cardiologist-  dr Cathie Olden--  08-18-2017 Intermittant risk nuclear study w/ large area of inferior  infartion with no evidence ishcemia   . 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 Diabetes mellitus type 2, noninsulin dependent ([place])   . Elevated PSA    since prostatectomy but now resolved  . Hematuria 04/2017  . History of ear infection    Right  . History of MI (myocardial infarction)    per myoview nuclear study 08-18-2017 , unknown when  . History of shingles 08/2017   L ear and scalp, possible  . Hyperlipidemia   . Hypertension   . Ischemic cardiomyopathy 09/01/2017   Presumed +CAD with Nuclear stress test 08/18/17 - Inferior scar, no ischemia, intermediate risk // med management unless +angina or worse dyspnea  . OA (osteoarthritis)   . Pacemaker 02/08/2014   followed by dr g. taylor--  single chamber Biotronik due to SSS  . Permanent atrial fibrillation   . Pneumonia 2019   Left lung  . Prostate cancer [facility]) urologist-  dr Diona Fanti   dx 2004--  Gleason 8, PSA 10.45--  11-28-2002  s/p  radical prostatectomy;  recurrent w/ increasing PSA, started ADT treatment  . RBBB (right bundle branch block)   . Sick sinus syndrome ([place])    a-Flutter with episodes of bradycardia; S/P Biotronik (serial number [id number]) 02-08-2014  . Urinary incontinence   . Wears hearing aid in right ear    receiver  and transmitter   Assessment:  85 yr old male on Coumadin prior to admission for atrial fibrillation.  Fell 9/17 pm PTA, closed hip fracture.  Coumadin held and INR down to 1.5 today.  To begin IV heparin without bolus.    PTA Coumadin:  5 mg daily except 7.5 mg on Tuesdays and Saturdays. Last dose 9/17.  INR on admit 3.8  Goal of Therapy:  Heparin level 0.3-0.7 units/ml Monitor platelets by anticoagulation protocol: Yes   Plan:   Heparin drip to begin at 1150 units/hr.  Heparin level ~8 hrs after drip begins.  Daily heparin level, PT/INR and CBC.  Coumadin on hold.  Will follow-up for OR timing and holding IV heparin pre-op.  Arty Baumgartner, [place] Pager: [phone] or phone:  [phone] 03/13/2019,10:18 AM

## 2019-03-13 NOTE — Progress Notes (Signed)
Patient ID: Oscar Baker, male   DOB: March 16, 1933, 85 y.o.   MRN: [id number]  Right periprosthetic hip fracture with persistent pain limiting his functional activities   INR 1.5  Posted for OR tomorrow for ORIF of right femur fracture NPO after MN

## 2019-03-13 NOTE — Progress Notes (Signed)
ANTICOAGULATION CONSULT NOTE - [place] for Heparin (while Warfarin on hold) Indication: atrial fibrillation  Allergies  Allergen Reactions  . Altace [Ramipril] Other (See Comments)    "throat felt like had a knot in it"  . Augmentin [Amoxicillin-Pot Clavulanate] Diarrhea    severe  . Codeine Nausea And Vomiting    Nausea and vomiting   . Simvastatin Other (See Comments)    Leg aches    Patient Measurements: Height: _0  (167.6 cm) Weight: 204 lb (92.5 kg) IBW/kg (Calculated) : 63.8 Heparin Dosing Weight: 85 kg  - prior heights listed as 5'10", patient reports 56 inches. Estimate 66-68 inches tall  Vital Signs: Temp: 97.9 F (36.6 C) (09/21 1956) Temp Source: Oral (09/21 1956) BP: 121/57 (09/21 1956) Pulse Rate: 70 (09/21 1956)  Labs: Recent Labs    03/11/19 0448 03/11/19 0955 03/12/19 0927 03/13/19 0511 03/13/19 2034  HGB 10.0*  --  10.1* 10.0*  --   HCT 30.5*  --  30.9* 29.5*  --   PLT 247  --  273 263  --   LABPROT  --  26.8* 20.7* 17.8*  --   INR  --  2.5* 1.8* 1.5*  --   HEPARINUNFRC  --   --   --   --  0.15*  CREATININE  --   --  0.94 0.96  --     Estimated Creatinine Clearance: 61 mL/min (by C-G formula based on SCr of 0.96 mg/dL).   Medical History: Past Medical History:  Diagnosis Date  . Asthma 1950's   history of  . Atrial fibrillation ([place])   . Bilateral carpal tunnel syndrome   . Bilateral lower extremity edema   . Bladder tumor   . Chronic systolic heart failure (HCC)    Echo 1/19: Mild LVH, EF 45-50, inf HK, MAC, severe LAE, severe RAE // Echo 7/15: Mild LVH, mod focal basal sept hypertrophy, EF 55-60, AV peak and mean 16/9, trivial MR, mod LAE, PASP 38  . CLL (chronic lymphocytic leukemia) [facility]) oncologist-  dr Ilene Qua--   dx [phone] ;  Lymphocytosis, CLL - per lov note 05-11-2017 currently under active survillance,  CT 04-17-2014 show very small lymphadenopathy, no indication for treatment  . Coronary artery  disease    cardiologist-  dr Cathie Olden--  08-18-2017 Intermittant risk nuclear study w/ large area of inferior infartion with no evidence ishcemia   . 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 Diabetes mellitus type 2, noninsulin dependent ([place])   . Elevated PSA    since prostatectomy but now resolved  . Hematuria 04/2017  . History of ear infection    Right  . History of MI (myocardial infarction)    per myoview nuclear study 08-18-2017 , unknown when  . History of shingles 08/2017   L ear and scalp, possible  . Hyperlipidemia   . Hypertension   . Ischemic cardiomyopathy 09/01/2017   Presumed +CAD with Nuclear stress test 08/18/17 - Inferior scar, no ischemia, intermediate risk // med management unless +angina or worse dyspnea  . OA (osteoarthritis)   . Pacemaker 02/08/2014   followed by dr g. taylor--  single chamber Biotronik due to SSS  . Permanent atrial fibrillation   . Pneumonia 2019   Left lung  . Prostate cancer [facility]) urologist-  dr Diona Fanti   dx 2004--  Gleason 8, PSA 10.45--  11-28-2002  s/p  radical prostatectomy;  recurrent w/ increasing PSA, started ADT treatment  . RBBB (right bundle branch  block)   . Sick sinus syndrome ([place])    a-Flutter with episodes of bradycardia; S/P Biotronik (serial number [id number]) 02-08-2014  . Urinary incontinence   . Wears hearing aid in right ear    receiver and transmitter   Assessment:  85 yr old male on warfarin prior to admission for atrial fibrillation.  Fell 9/17 pm PTA, resulting in closed hip fracture.  Warfarin held and INR down to 1.5.  Started on IV heparin without bolus.  Currently subtherapeutic on 1150 units/hr.  Plans for OR 9/22 at 5pm.    PTA Coumadin:  5 mg daily except 7.5 mg on Tuesdays and Saturdays. Last dose 9/17.  INR on admit 3.8  Goal of Therapy:  Heparin level 0.3-0.7 units/ml Monitor platelets by anticoagulation protocol: Yes   Plan:   Increase heparin to 1300 units/hr.  Check heparin level in 6 hours.   Daily heparin  level, PT/INR and CBC.   Will follow-up for OR timing and holding IV heparin pre-op.  [employer], Pharm.D., BCPS Clinical Pharmacist  **Pharmacist phone directory can now be found on [email or web address] ([email or web address]).  Listed under [place].  03/13/2019 9:26 PM

## 2019-03-13 NOTE — Progress Notes (Signed)
Heparin drip rate adjusted per order at 2213

## 2019-03-14 ENCOUNTER — Encounter (HOSPITAL_COMMUNITY): Payer: Self-pay | Admitting: *Deleted

## 2019-03-14 LAB — CBC WITH DIFFERENTIAL/PLATELET
Abs Immature Granulocytes: 0.42 10*3/uL — ABNORMAL HIGH (ref 0.00–0.07)
Basophils Absolute: 0.1 10*3/uL (ref 0.0–0.1)
Basophils Relative: 0 %
Eosinophils Absolute: 0.2 10*3/uL (ref 0.0–0.5)
Eosinophils Relative: 1 %
HCT: 29.3 % — ABNORMAL LOW (ref 39.0–52.0)
Hemoglobin: 9.7 g/dL — ABNORMAL LOW (ref 13.0–17.0)
Immature Granulocytes: 1 %
Lymphocytes Relative: 66 %
Lymphs Abs: 24.9 10*3/uL — ABNORMAL HIGH (ref 0.7–4.0)
MCH: 32.8 pg (ref 26.0–34.0)
MCHC: 33.1 g/dL (ref 30.0–36.0)
MCV: 99 fL (ref 80.0–100.0)
Monocytes Absolute: 2.6 10*3/uL — ABNORMAL HIGH (ref 0.1–1.0)
Monocytes Relative: 7 %
Neutro Abs: 9.5 10*3/uL — ABNORMAL HIGH (ref 1.7–7.7)
Neutrophils Relative %: 25 %
Platelets: 353 10*3/uL (ref 150–400)
RBC: 2.96 MIL/uL — ABNORMAL LOW (ref 4.22–5.81)
RDW: 18.3 % — ABNORMAL HIGH (ref 11.5–15.5)
WBC: 37.7 10*3/uL — ABNORMAL HIGH (ref 4.0–10.5)
nRBC: 0.2 % (ref 0.0–0.2)

## 2019-03-14 LAB — GLUCOSE, CAPILLARY
Glucose-Capillary: 364 mg/dL — ABNORMAL HIGH (ref 70–99)
Glucose-Capillary: 332 mg/dL — ABNORMAL HIGH (ref 70–99)
Glucose-Capillary: 228 mg/dL — ABNORMAL HIGH (ref 70–99)
Glucose-Capillary: 278 mg/dL — ABNORMAL HIGH (ref 70–99)

## 2019-03-14 LAB — PROTIME-INR
INR: 1.4 — ABNORMAL HIGH (ref 0.8–1.2)
Prothrombin Time: 16.7 seconds — ABNORMAL HIGH (ref 11.4–15.2)

## 2019-03-14 LAB — BASIC METABOLIC PANEL
Anion gap: 10 (ref 5–15)
BUN: 26 mg/dL — ABNORMAL HIGH (ref 8–23)
CO2: 24 mmol/L (ref 22–32)
Calcium: 8.3 mg/dL — ABNORMAL LOW (ref 8.9–10.3)
Chloride: 98 mmol/L (ref 98–111)
Creatinine, Ser: 0.92 mg/dL (ref 0.61–1.24)
GFR calc Af Amer: >60 mL/min (ref >60)
GFR calc non Af Amer: >60 mL/min (ref >60)
Glucose, Bld: 311 mg/dL — ABNORMAL HIGH (ref 70–99)
Potassium: 5 mmol/L (ref 3.5–5.1)
Sodium: 132 mmol/L — ABNORMAL LOW (ref 135–145)

## 2019-03-14 LAB — PATHOLOGIST SMEAR REVIEW: Path Review: INCREASED

## 2019-03-14 LAB — HEPARIN LEVEL (UNFRACTIONATED): Heparin Unfractionated: 0.28 IU/mL — ABNORMAL LOW (ref 0.30–0.70)

## 2019-03-14 MED ORDER — LACTATED RINGERS IV SOLN
INTRAVENOUS | Status: DC
  Administered 2019-03-14: 17:00:00 via INTRAVENOUS

## 2019-03-14 MED ORDER — PROPOFOL 10 MG/ML IV BOLUS
INTRAVENOUS | Status: AC
  Filled 2019-03-14: qty 20

## 2019-03-14 MED ORDER — ENOXAPARIN SODIUM 40 MG/0.4ML ~~LOC~~ SOLN
40.0000 mg | SUBCUTANEOUS | Status: AC
  Administered 2019-03-14: 20:00:00 40 mg via SUBCUTANEOUS
  Filled 2019-03-14: qty 0.4

## 2019-03-14 MED ORDER — INSULIN GLARGINE 100 UNIT/ML ~~LOC~~ SOLN
20.0000 [IU] | Freq: Every day | SUBCUTANEOUS | Status: DC
  Filled 2019-03-14: qty 0.2

## 2019-03-14 NOTE — Progress Notes (Signed)
PROGRESS NOTE    Oscar Baker  [id number] DOB: 28-Mar-1933 DOA: 03/09/2019 PCP: [name], MD   Brief Narrative:  Oscar Baker is a 85 y.o. male with history of atrial fibrillation, pacemaker placement for bradycardia, diastolic dysfunction, CHF, anemia, diabetes mellitus, prostate cancer who had a fall at home after tripping.  Fell on backwards and hit chest on the mid back.  Patient denied losing consciousness or having any chest pain shortness of breath palpitation.  In the ER x-rays and CT scan reveals a right hip periprosthetic fracture.  On-call orthopedic surgeon Dr. Alvan Dame was consulted.  On exam patient has mid back bruising CAT scan revealed mildly small hematoma which is not enlarging.  EKG shows atrial flutter rate controlled.  Labs reveal leukocytosis from known CLL mild anemia otherwise largely unremarkable.  INR was 3.8.  Coag negative.  He was admitted under hospitalist service.  Assessment & Plan:   Principal Problem:   Closed right hip fracture, initial encounter ([place]) Active Problems:   PAF (paroxysmal atrial fibrillation) ([place])   Diabetes mellitus type 2, noninsulin dependent (HCC)   Laryngopharyngeal reflux   CLL (chronic lymphocytic leukemia) (HCC)   Type 2 diabetes mellitus ([place])  Right-sided periprosthetic hip fracture: Pain control.  Reportedly, he is scheduled to have surgical repair done today.  History of atrial fibrillation status post pacemaker placement: Rate is controlled.  INR subtherapeutic.  He is on heparin.  Heparin to be stopped whenever orthopedics wants before the surgery.  RN to touch base with orthopedic about that.  CLL: Leukocytosis is stable.  Hyperlipidemia: Continue statin.  Type 2 diabetes mellitus: Blood sugar significantly elevated.  Will increase Lantus to 20 units.  Have to be cautious since he is going to have surgery and not sure how much he is going to eat.  Continue SSI.  DVT prophylaxis: Heparin drip Code Status: DNR  Family Communication: Plan of care discussed with patient at bedside.  No family present. Disposition Plan: TBD  Consultants:   Orthopedics  Procedures:   None  Antimicrobials:   None   Subjective: Patient seen and examined.  Pain well controlled.  No new complaint.  He is in good spirits before the surgery.  Objective: Vitals:   03/13/19 1532 03/13/19 1956 03/14/19 0408 03/14/19 0803  BP: 115/64 (!) 121/57 105/72 106/60  Pulse: 74 70 72 80  Resp: 16 18 17 18   Temp: 98.6 F (37 C) 97.9 F (36.6 C) 97.8 F (36.6 C) 98.3 F (36.8 C)  TempSrc: Oral Oral Oral   SpO2: 94% 93% 91% 99%  Weight:      Height:        Intake/Output Summary (Last 24 hours) at 03/14/2019 1047 Last data filed at 03/14/2019 0900 Gross per 24 hour  Intake 840 ml  Output 400 ml  Net 440 ml   Filed Weights   03/09/19 2341  Weight: 92.5 kg    Examination:  General exam: Appears calm and comfortable, obese Respiratory system: Clear to auscultation. Respiratory effort normal. Cardiovascular system: S1 & S2 heard, RRR. No JVD, murmurs, rubs, gallops or clicks. No pedal edema. Gastrointestinal system: Abdomen is nondistended, soft and nontender. No organomegaly or masses felt. Normal bowel sounds heard. Central nervous system: Alert and oriented. No focal neurological deficits. Skin: No rashes, lesions or ulcers.  Psychiatry: Judgement and insight appear normal. Mood & affect appropriate.    Data Reviewed: I have personally reviewed following labs and imaging studies  CBC: Recent Labs  Lab  03/09/19 2357 03/11/19 0448 03/12/19 0927 03/13/19 0511 03/14/19 0400  WBC 33.7* 31.0* 35.3* 37.5* 37.7*  NEUTROABS 6.4  --  7.7 10.9* 9.5*  HGB 12.2* 10.0* 10.1* 10.0* 9.7*  HCT 37.7* 30.5* 30.9* 29.5* 29.3*  MCV 101.6* 99.3 99.0 97.7 99.0  PLT 255 247 273 263 0000000   Basic Metabolic Panel: Recent Labs  Lab 03/09/19 2357 03/10/19 0618 03/12/19 0927 03/13/19 0511 03/14/19 0400  NA 140 137  133* 133* 132*  K 4.7 4.6 5.0 4.9 5.0  CL 105 105 102 100 98  CO2 26 25 24 24 24   GLUCOSE 164* 188* 278* 276* 311*  BUN 17 17 22  24* 26*  CREATININE 1.03 0.96 0.94 0.96 0.92  CALCIUM 8.9 8.6* 8.5* 8.5* 8.3*   GFR: Estimated Creatinine Clearance: 63.7 mL/min (by C-G formula based on SCr of 0.92 mg/dL). Liver Function Tests: No results for input(s): AST, ALT, ALKPHOS, BILITOT, PROT, ALBUMIN in the last 168 hours. No results for input(s): LIPASE, AMYLASE in the last 168 hours. No results for input(s): AMMONIA in the last 168 hours. Coagulation Profile: Recent Labs  Lab 03/10/19 0618 03/11/19 0955 03/12/19 0927 03/13/19 0511 03/14/19 0400  INR 3.5* 2.5* 1.8* 1.5* 1.4*   Cardiac Enzymes: No results for input(s): CKTOTAL, CKMB, CKMBINDEX, TROPONINI in the last 168 hours. BNP (last 3 results) No results for input(s): PROBNP in the last 8760 hours. HbA1C: No results for input(s): HGBA1C in the last 72 hours. CBG: Recent Labs  Lab 03/13/19 0646 03/13/19 1145 03/13/19 1644 03/13/19 2123 03/14/19 0639  GLUCAP 243* 275* 304* 328* 364*   Lipid Profile: No results for input(s): CHOL, HDL, LDLCALC, TRIG, CHOLHDL, LDLDIRECT in the last 72 hours. Thyroid Function Tests: No results for input(s): TSH, T4TOTAL, FREET4, T3FREE, THYROIDAB in the last 72 hours. Anemia Panel: No results for input(s): VITAMINB12, FOLATE, FERRITIN, TIBC, IRON, RETICCTPCT in the last 72 hours. Sepsis Labs: No results for input(s): PROCALCITON, LATICACIDVEN in the last 168 hours.  Recent Results (from the past 240 hour(s))  SARS CORONAVIRUS 2 (TAT 6-24 HRS) Nasopharyngeal Nasopharyngeal Swab     Status: None   Collection Time: 03/10/19  1:53 AM   Specimen: Nasopharyngeal Swab  Result Value Ref Range Status   SARS Coronavirus 2 NEGATIVE NEGATIVE Final    Comment: (NOTE) SARS-CoV-2 target nucleic acids are NOT DETECTED. The SARS-CoV-2 RNA is generally detectable in upper and lower respiratory specimens  during the acute phase of infection. Negative results do not preclude SARS-CoV-2 infection, do not rule out co-infections with other pathogens, and should not be used as the sole basis for treatment or other patient management decisions. Negative results must be combined with clinical observations, patient history, and epidemiological information. The expected result is Negative. Fact Sheet for Patients: SugarRoll.be Fact Sheet for Healthcare Providers: https://www.woods-mathews.com/ This test is not yet approved or cleared by the [place] FDA and  has been authorized for detection and/or diagnosis of SARS-CoV-2 by FDA under an Emergency Use Authorization (EUA). This EUA will remain  in effect (meaning this test can be used) for the duration of the COVID-19 declaration under Section 56 4(b)(1) of the Act, 21 U.S.C. section 360bbb-3(b)(1), unless the authorization is terminated or revoked sooner. Performed at [facility], [place] [address], [place], [place] [address]   Surgical pcr screen     Status: Abnormal   Collection Time: 03/10/19  3:39 PM   Specimen: Nasal Mucosa; Nasal Swab  Result Value Ref Range Status   MRSA,  PCR NEGATIVE NEGATIVE Final   Staphylococcus aureus POSITIVE (A) NEGATIVE Final    Comment: (NOTE) The Xpert SA Assay (FDA approved for NASAL specimens in patients 73 years of age and older), is one component of a comprehensive surveillance program. It is not intended to diagnose infection nor to guide or monitor treatment. Performed at [facility], [place] [address], [place],  [address]       Radiology Studies: No results found.  Scheduled Meds: . atorvastatin  20 mg Oral q1800  . carvedilol  3.125 mg Oral BID WC  . chlorhexidine  60 mL Topical Once  . Chlorhexidine Gluconate Cloth  6 each Topical Daily  . feeding supplement  296 mL Oral Once  . fenofibrate  160 mg Oral Daily  . gabapentin   300 mg Oral TID  . insulin aspart  0-9 Units Subcutaneous TID WC  . insulin glargine  15 Units Subcutaneous Daily  . multivitamin with minerals  1 tablet Oral Daily  . mupirocin ointment   Nasal BID  . povidone-iodine  2 application Topical Once  . vitamin B-12  1,000 mcg Oral Daily   Continuous Infusions: .  ceFAZolin (ANCEF) IV    . tranexamic acid       LOS: 4 days   Time spent: 24 minutes  Darliss Cheney, MD [facility] Pager [phone]  If 7PM-7AM, please contact night-coverage [email or web address] Password [facility] 03/14/2019, 10:47 AM

## 2019-03-14 NOTE — Progress Notes (Signed)
Patient ID: Oscar Baker, male   DOB: Nov 08, 1932, 85 y.o.   MRN: [id number]  Tonight's scheduled case was cancelled due to emergency surgeries bumping [place].  The case has been rescheduled for 12 noon tomorrow  NPO after MN Lovenox 40 mg times 1 tonight.  This was reviewed with his wife

## 2019-03-14 NOTE — TOC Initial Note (Signed)
Transition of Care [facility]) - Initial/Assessment Note    Patient Details  Name: Oscar Baker MRN: [id number] Date of Birth: 02-Feb-1933  Transition of Care [facility]) CM/SW Contact:    Weston Anna, LCSW Phone Number: 03/14/2019, 11:21 AM  Clinical Narrative:                  CSW spoke with patient to discuss discharge plans. Patient will be going to the OR today at [facility]. PT is currently recommending SNF at discharge- patient stated he didn't want to make any decisions at this time regarding discharge plans until after surgery. CSW questioned if she could follow up with his spouse- patient stated no.   CSW will follow up with patient tomorrow 9/23 after his surgery.   Expected Discharge Plan: Skilled Nursing Facility Barriers to Discharge: Continued Medical Work up   Patient Goals and CMS Choice        Expected Discharge Plan and Services Expected Discharge Plan: [place] In-house Referral: Clinical Social Work     Living arrangements for the past 2 months: Single Family Home                 DME Arranged: N/A         HH Arranged: NA          Prior Living Arrangements/Services Living arrangements for the past 2 months: Single Family Home Lives with:: Spouse Patient language and need for interpreter reviewed:: Yes Do you feel safe going back to the place where you live?: Yes      Need for Family Participation in Patient Care: Yes (Comment) Care giver support system in place?: Yes (comment)      Activities of Daily Living      Permission Sought/Granted                  Emotional Assessment   Attitude/Demeanor/Rapport: Engaged Affect (typically observed): Calm, Blunt, Appropriate Orientation: : Oriented to Self, Oriented to Place, Oriented to  Time, Oriented to Situation   Psych Involvement: No (comment)  Admission diagnosis:  Coagulopathy ([place]) [D68.9] Pain [R52] Closed nondisplaced transverse fracture of shaft of right femur,  initial encounter ([place]) [S72.324A] Closed right hip fracture, initial encounter ([place]) [S72.001A] Patient Active Problem List   Diagnosis Date Noted  . Closed right hip fracture, initial encounter ([place]) 03/10/2019  . Chronic bronchitis with productive mucopurulent cough ([place]) 08/22/2018  . Acute systolic heart failure ([place]) 08/02/2018  . Type 2 diabetes mellitus ([place]) 08/02/2018  . Pain in joint of right shoulder 06/08/2018  . CAD (coronary artery disease) 05/21/2018  . Asthma exacerbation 05/21/2018  . Aspiration pneumonia ([place]) 05/21/2018  . Elevated INR   . Shoulder strain, right, initial encounter   . Acute respiratory failure with hypoxia ([place]) 05/20/2018  . Bilateral impacted cerumen 02/14/2018  . Asymmetric SNHL (sensorineural hearing loss) 02/14/2018  . Encounter for therapeutic drug monitoring 12/20/2017  . S/P thoracotomy 11/29/2017  . Sepsis ([place])   . Hypoxemia   . Pneumonia of left lower lobe due to infectious organism ([place])   . Mediastinal lymphadenopathy   . Chronic cough 11/23/2017  . CLL (chronic lymphocytic leukemia) ([place]) 11/23/2017  . Pleural effusion 11/23/2017  . HCAP (healthcare-associated pneumonia) 11/20/2017  . Anemia 11/20/2017  . Mixed conductive and sensorineural hearing loss of right ear with restricted hearing of left ear 09/14/2017  . Abnormal hearing test 09/06/2017  . Cellulitis 09/06/2017  . Lymphadenitis, acute 09/06/2017  . Ischemic cardiomyopathy 09/01/2017  .  S/p bilateral revision of total hip arthroplasty 08/11/2017  . History of revision of total replacement of left hip joint 08/11/2017  . Right hip pain   . Acute otitis media   . Chronic systolic CHF (congestive heart failure) ([place])   . Pressure injury of skin 07/20/2017  . Pressure ulcer 07/20/2017  . Malignant otitis externa 07/19/2017  . Fall in home 07/19/2017  . Acute on chronic diastolic heart failure ([place]) 07/19/2017  . S/P left THA, AA 05/12/2016  . History of repair of hip  joint 05/12/2016  . Laryngopharyngeal reflux 05/21/2015  . Allergic rhinitis 05/21/2015  . Obesity 12/12/2014  . Upper airway cough syndrome 12/11/2014  . Posterior rhinorrhea 12/11/2014  . Cough variant asthma 07/27/2014  . Wheezing 06/20/2014  . Symptomatic bradycardia - s/p Biotronik (serial number [id number]) 02/08/2014  . Bradycardia 02/08/2014  . Diabetes mellitus type 2, noninsulin dependent ([place])   . Mixed incontinence 03/09/2013  . Edema of foot 03/06/2013  . Traumatic ecchymosis of right foot 03/06/2013  . Contusion of foot 03/06/2013  . Malignant neoplasm of prostate ([place]) 02/15/2013  . Diabetes mellitus without complication ([place]) 0000000  . Bone spur 12/30/2012  . Pain of toe of left foot 12/30/2012  . Hyperlipidemia 07/13/2012  . Hypertensive disorder 04/12/2012  . Sick sinus syndrome ([place]) 08/17/2011  . PAF (paroxysmal atrial fibrillation) ([place]) 09/16/2010   PCP:  [name], MD Pharmacy:   [facility] ([place]) [place], [place] [address] Phone: [phone] Fax: [phone]  [place], [name] - [address] [place] [address] Phone: [phone] Fax: [phone]  [place], [name] - [address] [name] [address] Phone: [phone] Fax: [phone]     Social Determinants of Health (SDOH) Interventions    Readmission Risk Interventions No flowsheet data found.

## 2019-03-14 NOTE — Progress Notes (Signed)
Physical Therapy Treatment Patient Details Name: Oscar Baker MRN: [id number] DOB: 05-19-1933 Today's Date: 03/14/2019    History of Present Illness 85 yo male with onset of trip at home resulting in fall and fracture of R femur adjacent to prosthetic components of hip.  Pt is TDWB and MD is making a surgical decision tomorrow.  PMHx:  R BBB, shingles, HTN, pacemaker, CHF, DM, prostate and bladder CA, MI, PAF, CLL    PT Comments    Pt performed rolling for position and placement of bed pan for BM.  Pt required moderate +2 assistance to turn and turn back to position for pan placement.  He is extremely painful during mobilization and he admantly refused to attempt OOB.  He is planned for surgery this pm to improve pain management during recovery.  Pt is eager to have surgery and progress from there.     Follow Up Recommendations  SNF;Supervision for mobility/OOB;Supervision/Assistance - 24 hour     Equipment Recommendations  None recommended by PT    Recommendations for Other Services       Precautions / Restrictions Precautions Precautions: Fall Precaution Comments: premedicate Restrictions Weight Bearing Restrictions: Yes RLE Weight Bearing: Touchdown weight bearing(per ortho note)    Mobility  Bed Mobility Overal bed mobility: Needs Assistance Bed Mobility: Rolling Rolling: Mod assist;+2 for physical assistance         General bed mobility comments: Pt required cues for hand placement and piillow between legs for comfort.  He reports he is unable to atttempt transfer OOB to commode at this time.  Pt extremely painful with rolling for bed pan placement.  Transfers                    Ambulation/Gait                 Stairs             Wheelchair Mobility    Modified Rankin (Stroke Patients Only)       Balance Overall balance assessment: History of Falls;Needs assistance Sitting-balance support: Feet supported;Bilateral upper extremity  supported Sitting balance-Leahy Scale: Fair                                      Cognition Arousal/Alertness: Awake/alert Behavior During Therapy: WFL for tasks assessed/performed Overall Cognitive Status: Within Functional Limits for tasks assessed                                        Exercises      General Comments        Pertinent Vitals/Pain Pain Assessment: Faces Faces Pain Scale: Hurts whole lot Pain Location: R hip and leg Pain Descriptors / Indicators: Grimacing;Heaviness;Guarding Pain Intervention(s): Monitored during session;Repositioned    Home Living                      Prior Function            PT Goals (current goals can now be found in the care plan section) Acute Rehab PT Goals Patient Stated Goal: To have surgery today Potential to Achieve Goals: Fair Progress towards PT goals: Progressing toward goals    Frequency    Min 2X/week      PT Plan Current plan remains appropriate  Co-evaluation              AM-PAC PT "6 Clicks" Mobility   Outcome Measure  Help needed turning from your back to your side while in a flat bed without using bedrails?: A Lot Help needed moving from lying on your back to sitting on the side of a flat bed without using bedrails?: A Lot Help needed moving to and from a bed to a chair (including a wheelchair)?: A Lot Help needed standing up from a chair using your arms (e.g., wheelchair or bedside chair)?: Total Help needed to walk in hospital room?: Total Help needed climbing 3-5 steps with a railing? : Total 6 Click Score: 9    End of Session Equipment Utilized During Treatment: Gait belt Activity Tolerance: Patient limited by fatigue;Patient limited by pain Patient left: in bed;with call bell/phone within reach;with bed alarm set Nurse Communication: Mobility status PT Visit Diagnosis: Unsteadiness on feet (R26.81);Muscle weakness (generalized) (M62.81);History  of falling (Z91.81)     Time: [id number] PT Time Calculation (min) (ACUTE ONLY): 9 min  Charges:  $Therapeutic Activity: 8-22 mins                     Governor Rooks, PTA Acute Rehabilitation Services Pager [phone] Office [phone]     Mykah Shin Eli Hose 03/14/2019, 12:34 PM

## 2019-03-14 NOTE — Plan of Care (Signed)
  Problem: Coping: Goal: Level of anxiety will decrease Outcome: Progressing   Problem: Pain Managment: Goal: General experience of comfort will improve Outcome: Progressing   Problem: Safety: Goal: Ability to remain free from injury will improve Outcome: Progressing   

## 2019-03-14 NOTE — Progress Notes (Signed)
Heparin drip rate adjusted per order at 0534

## 2019-03-14 NOTE — Progress Notes (Signed)
Patient's surgery has been delayed until tomorrow. Notified patient, wife, and Louie Casa, Therapist, sports on 5N.

## 2019-03-14 NOTE — Progress Notes (Signed)
ANTICOAGULATION CONSULT NOTE - Follow Up Consult  Pharmacy Consult for Heparin (warfarin on hold) Indication: atrial fibrillation  Allergies  Allergen Reactions  . Altace [Ramipril] Other (See Comments)    "throat felt like had a knot in it"  . Augmentin [Amoxicillin-Pot Clavulanate] Diarrhea    severe  . Codeine Nausea And Vomiting    Nausea and vomiting   . Simvastatin Other (See Comments)    Leg aches    Patient Measurements: Height: 5\' 6"  (167.6 cm) Weight: 204 lb (92.5 kg) IBW/kg (Calculated) : 63.8 Heparin Dosing Weight: 85 kg  - prior heights listed as 5'10", patient reports 56 inches. Estimate 66-68 inches tall  Vital Signs: Temp: 98.3 F (36.8 C) (09/22 0803) Temp Source: Oral (09/22 0408) BP: 106/60 (09/22 0803) Pulse Rate: 80 (09/22 0803)  Labs: Recent Labs    03/12/19 0927 03/13/19 0511 03/13/19 2034 03/14/19 0400  HGB 10.1* 10.0*  --  9.7*  HCT 30.9* 29.5*  --  29.3*  PLT 273 263  --  353  LABPROT 20.7* 17.8*  --  16.7*  INR 1.8* 1.5*  --  1.4*  HEPARINUNFRC  --   --  0.15* 0.28*  CREATININE 0.94 0.96  --  0.92    Estimated Creatinine Clearance: 63.7 mL/min (by C-G formula based on SCr of 0.92 mg/dL).  Assessment:   85 yr old male on warfarin prior to admission for atrial fibrillation.  Fell 9/17 pm PTA, resulting in closed hip fracture.  Warfarin held and INR down to 1.5 on 9/21 and started on IV heparin  without bolus.  Plans for OR 9/22 at 5pm.    PTA Coumadin:  5 mg daily except 7.5 mg on Tuesdays and Saturdays. Last dose 9/17.  INR on admit 3.8    Last heparin level just below target range (0.28) early this am and drip increased from 1300 to 1400 units/hr.  Heparin held at ~10am per Dr. Alvan Dame.  For OR this afternoon.  INR 1.4.  Goal of Therapy:  Heparin level 0.3-0.7 units/ml Monitor platelets by anticoagulation protocol: Yes   Plan:   Heparin off ~10am, so follow-up heparin level cancelled.  Warfarin on hold.  OR this afternoon.  Will follow up for timing of resuming IV heparin and warfarin post-op.   Arty Baumgartner, [place] Pager: [phone] or phone: [phone] 03/14/2019,10:48 AM

## 2019-03-14 NOTE — Progress Notes (Signed)
ANTICOAGULATION CONSULT NOTE - [place] for Heparin (while Warfarin on hold) Indication: atrial fibrillation  Allergies  Allergen Reactions  . Altace [Ramipril] Other (See Comments)    "throat felt like had a knot in it"  . Augmentin [Amoxicillin-Pot Clavulanate] Diarrhea    severe  . Codeine Nausea And Vomiting    Nausea and vomiting   . Simvastatin Other (See Comments)    Leg aches    Patient Measurements: Height: _0  (167.6 cm) Weight: 204 lb (92.5 kg) IBW/kg (Calculated) : 63.8 Heparin Dosing Weight: 85 kg  - prior heights listed as 5'10", patient reports 56 inches. Estimate 66-68 inches tall  Vital Signs: Temp: 97.8 F (36.6 C) (09/22 0408) Temp Source: Oral (09/22 0408) BP: 105/72 (09/22 0408) Pulse Rate: 72 (09/22 0408)  Labs: Recent Labs    03/12/19 0927 03/13/19 0511 03/13/19 2034 03/14/19 0400  HGB 10.1* 10.0*  --  9.7*  HCT 30.9* 29.5*  --  29.3*  PLT 273 263  --  353  LABPROT 20.7* 17.8*  --  16.7*  INR 1.8* 1.5*  --  1.4*  HEPARINUNFRC  --   --  0.15* 0.28*  CREATININE 0.94 0.96  --   --     Estimated Creatinine Clearance: 61 mL/min (by C-G formula based on SCr of 0.96 mg/dL).   Medical History: Past Medical History:  Diagnosis Date  . Asthma 1950's   history of  . Atrial fibrillation ([place])   . Bilateral carpal tunnel syndrome   . Bilateral lower extremity edema   . Bladder tumor   . Chronic systolic heart failure (HCC)    Echo 1/19: Mild LVH, EF 45-50, inf HK, MAC, severe LAE, severe RAE // Echo 7/15: Mild LVH, mod focal basal sept hypertrophy, EF 55-60, AV peak and mean 16/9, trivial MR, mod LAE, PASP 38  . CLL (chronic lymphocytic leukemia) [facility]) oncologist-  dr Ilene Qua--   dx [phone] ;  Lymphocytosis, CLL - per lov note 05-11-2017 currently under active survillance,  CT 04-17-2014 show very small lymphadenopathy, no indication for treatment  . Coronary artery disease    cardiologist-  dr Cathie Olden--  08-18-2017  Intermittant risk nuclear study w/ large area of inferior infartion with no evidence ishcemia   . 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 Diabetes mellitus type 2, noninsulin dependent ([place])   . Elevated PSA    since prostatectomy but now resolved  . Hematuria 04/2017  . History of ear infection    Right  . History of MI (myocardial infarction)    per myoview nuclear study 08-18-2017 , unknown when  . History of shingles 08/2017   L ear and scalp, possible  . Hyperlipidemia   . Hypertension   . Ischemic cardiomyopathy 09/01/2017   Presumed +CAD with Nuclear stress test 08/18/17 - Inferior scar, no ischemia, intermediate risk // med management unless +angina or worse dyspnea  . OA (osteoarthritis)   . Pacemaker 02/08/2014   followed by dr g. taylor--  single chamber Biotronik due to SSS  . Permanent atrial fibrillation   . Pneumonia 2019   Left lung  . Prostate cancer [facility]) urologist-  dr Diona Fanti   dx 2004--  Gleason 8, PSA 10.45--  11-28-2002  s/p  radical prostatectomy;  recurrent w/ increasing PSA, started ADT treatment  . RBBB (right bundle branch block)   . Sick sinus syndrome ([place])    a-Flutter with episodes of bradycardia; S/P Biotronik (serial number [id number]) 02-08-2014  . Urinary  incontinence   . Wears hearing aid in right ear    receiver and transmitter   Assessment:  85 yr old male on warfarin prior to admission for atrial fibrillation.  Fell 9/17 pm PTA, resulting in closed hip fracture.  Warfarin held and INR down to 1.5.  Started on IV heparin without bolus.  Plans for OR 9/22 at 5pm.    PTA Coumadin:  5 mg daily except 7.5 mg on Tuesdays and Saturdays. Last dose 9/17.  INR on admit 3.8  Heparin level slightly subtherapeutic at 0.28 s/p rate increase    Goal of Therapy:  Heparin level 0.3-0.7 units/ml Monitor platelets by anticoagulation protocol: Yes   Plan:  Increase heparin to 1400 units/hr. Check heparin level in 6 hours.  F/u ortho plan for OR today  Bertis Ruddy,  PharmD Clinical Pharmacist Please check AMION for all [place] numbers 03/14/2019 5:14 AM

## 2019-03-15 ENCOUNTER — Inpatient Hospital Stay (HOSPITAL_COMMUNITY): Payer: Medicare Other | Admitting: Certified Registered Nurse Anesthetist

## 2019-03-15 ENCOUNTER — Encounter (HOSPITAL_COMMUNITY)
Admission: EM | Disposition: A | Discharge status: Rehabilitation Facility | Payer: Self-pay | Source: Home / Self Care | Attending: Family Medicine

## 2019-03-15 ENCOUNTER — Encounter (HOSPITAL_COMMUNITY): Payer: Self-pay | Admitting: Orthopedic Surgery

## 2019-03-15 ENCOUNTER — Inpatient Hospital Stay (HOSPITAL_COMMUNITY): Payer: Medicare Other

## 2019-03-15 HISTORY — PX: ORIF PERIPROSTHETIC FRACTURE: SHX5034

## 2019-03-15 LAB — BASIC METABOLIC PANEL
Anion gap: 9 (ref 5–15)
BUN: 33 mg/dL — ABNORMAL HIGH (ref 8–23)
CO2: 24 mmol/L (ref 22–32)
Calcium: 8.7 mg/dL — ABNORMAL LOW (ref 8.9–10.3)
Chloride: 98 mmol/L (ref 98–111)
Creatinine, Ser: 0.88 mg/dL (ref 0.61–1.24)
GFR calc Af Amer: >60 mL/min (ref >60)
GFR calc non Af Amer: >60 mL/min (ref >60)
Glucose, Bld: 268 mg/dL — ABNORMAL HIGH (ref 70–99)
Potassium: 4.8 mmol/L (ref 3.5–5.1)
Sodium: 131 mmol/L — ABNORMAL LOW (ref 135–145)

## 2019-03-15 LAB — GLUCOSE, CAPILLARY
Glucose-Capillary: 238 mg/dL — ABNORMAL HIGH (ref 70–99)
Glucose-Capillary: 186 mg/dL — ABNORMAL HIGH (ref 70–99)
Glucose-Capillary: 237 mg/dL — ABNORMAL HIGH (ref 70–99)
Glucose-Capillary: 181 mg/dL — ABNORMAL HIGH (ref 70–99)
Glucose-Capillary: 188 mg/dL — ABNORMAL HIGH (ref 70–99)

## 2019-03-15 LAB — CBC WITH DIFFERENTIAL/PLATELET
Abs Immature Granulocytes: 0.31 10*3/uL — ABNORMAL HIGH (ref 0.00–0.07)
Basophils Absolute: 0.1 10*3/uL (ref 0.0–0.1)
Basophils Relative: 0 %
Eosinophils Absolute: 0.1 10*3/uL (ref 0.0–0.5)
Eosinophils Relative: 0 %
HCT: 28.6 % — ABNORMAL LOW (ref 39.0–52.0)
Hemoglobin: 9.4 g/dL — ABNORMAL LOW (ref 13.0–17.0)
Immature Granulocytes: 1 %
Lymphocytes Relative: 72 %
Lymphs Abs: 25.5 10*3/uL — ABNORMAL HIGH (ref 0.7–4.0)
MCH: 32.2 pg (ref 26.0–34.0)
MCHC: 32.9 g/dL (ref 30.0–36.0)
MCV: 97.9 fL (ref 80.0–100.0)
Monocytes Absolute: 2.7 10*3/uL — ABNORMAL HIGH (ref 0.1–1.0)
Monocytes Relative: 8 %
Neutro Abs: 6.9 10*3/uL (ref 1.7–7.7)
Neutrophils Relative %: 19 %
Platelets: 406 10*3/uL — ABNORMAL HIGH (ref 150–400)
RBC: 2.92 MIL/uL — ABNORMAL LOW (ref 4.22–5.81)
RDW: 18.4 % — ABNORMAL HIGH (ref 11.5–15.5)
WBC: 35.6 10*3/uL — ABNORMAL HIGH (ref 4.0–10.5)
nRBC: 0.1 % (ref 0.0–0.2)

## 2019-03-15 LAB — TYPE AND SCREEN
ABO/RH(D): O POS
Antibody Screen: NEGATIVE

## 2019-03-15 LAB — PROTIME-INR
INR: 1.2 (ref 0.8–1.2)
Prothrombin Time: 14.9 seconds (ref 11.4–15.2)

## 2019-03-15 SURGERY — OPEN REDUCTION INTERNAL FIXATION (ORIF) PERIPROSTHETIC FRACTURE
Anesthesia: General | Site: Hip | Laterality: Right

## 2019-03-15 MED ORDER — TRANEXAMIC ACID 1000 MG/10ML IV SOLN
INTRAVENOUS | Status: DC | PRN
  Administered 2019-03-15: 14:00:00 1000 mg via INTRAVENOUS

## 2019-03-15 MED ORDER — CEFAZOLIN SODIUM-DEXTROSE 2-3 GM-%(50ML) IV SOLR
INTRAVENOUS | Status: DC | PRN
  Administered 2019-03-15: 2 g via INTRAVENOUS

## 2019-03-15 MED ORDER — FENTANYL CITRATE (PF) 100 MCG/2ML IJ SOLN
INTRAMUSCULAR | Status: AC
  Filled 2019-03-15: qty 2

## 2019-03-15 MED ORDER — ONDANSETRON HCL 4 MG/2ML IJ SOLN: 4.0000 mg | Freq: Four times a day (QID) | INTRAMUSCULAR | Status: DC | PRN

## 2019-03-15 MED ORDER — FENTANYL CITRATE (PF) 100 MCG/2ML IJ SOLN
25.0000 ug | INTRAMUSCULAR | Status: DC | PRN
  Administered 2019-03-15 (×2): 50 ug via INTRAVENOUS

## 2019-03-15 MED ORDER — SUCCINYLCHOLINE CHLORIDE 20 MG/ML IJ SOLN
INTRAMUSCULAR | Status: DC | PRN
  Administered 2019-03-15: 120 mg via INTRAVENOUS

## 2019-03-15 MED ORDER — LACTATED RINGERS IV SOLN
INTRAVENOUS | Status: DC
  Administered 2019-03-15: 12:00:00 via INTRAVENOUS

## 2019-03-15 MED ORDER — 0.9 % SODIUM CHLORIDE (POUR BTL) OPTIME
TOPICAL | Status: DC | PRN
  Administered 2019-03-15: 14:00:00 1000 mL

## 2019-03-15 MED ORDER — PHENOL 1.4 % MT LIQD: 1.0000 | OROMUCOSAL | Status: DC | PRN

## 2019-03-15 MED ORDER — HYDROMORPHONE HCL 1 MG/ML IJ SOLN
0.5000 mg | INTRAMUSCULAR | Status: DC | PRN
  Administered 2019-03-17: 13:00:00 1 mg via INTRAVENOUS
  Administered 2019-03-18: 0.5 mg via INTRAVENOUS
  Administered 2019-03-19: 1 mg via INTRAVENOUS
  Filled 2019-03-15 (×3): qty 1

## 2019-03-15 MED ORDER — WARFARIN - PHARMACIST DOSING INPATIENT
Freq: Every day | Status: DC
  Administered 2019-03-18 – 2019-03-19 (×2)

## 2019-03-15 MED ORDER — METOCLOPRAMIDE HCL 5 MG PO TABS: 5.0000 mg | ORAL_TABLET | Freq: Three times a day (TID) | ORAL | Status: DC | PRN

## 2019-03-15 MED ORDER — WARFARIN SODIUM 5 MG PO TABS
10.0000 mg | ORAL_TABLET | Freq: Once | ORAL | Status: AC
  Administered 2019-03-15: 18:00:00 10 mg via ORAL
  Filled 2019-03-15: qty 2

## 2019-03-15 MED ORDER — ONDANSETRON HCL 4 MG/2ML IJ SOLN
INTRAMUSCULAR | Status: AC
  Filled 2019-03-15: qty 2

## 2019-03-15 MED ORDER — ACETAMINOPHEN 500 MG PO TABS
1000.0000 mg | ORAL_TABLET | Freq: Four times a day (QID) | ORAL | Status: AC
  Administered 2019-03-15 – 2019-03-16 (×4): 1000 mg via ORAL
  Filled 2019-03-15 (×4): qty 2

## 2019-03-15 MED ORDER — INSULIN ASPART 100 UNIT/ML ~~LOC~~ SOLN
5.0000 [IU] | Freq: Once | SUBCUTANEOUS | Status: AC
  Administered 2019-03-15: 5 [IU] via SUBCUTANEOUS

## 2019-03-15 MED ORDER — ONDANSETRON HCL 4 MG PO TABS: 4.0000 mg | ORAL_TABLET | Freq: Four times a day (QID) | ORAL | Status: DC | PRN

## 2019-03-15 MED ORDER — PROPOFOL 10 MG/ML IV BOLUS
INTRAVENOUS | Status: DC | PRN
  Administered 2019-03-15: 80 mg via INTRAVENOUS

## 2019-03-15 MED ORDER — MEPERIDINE HCL 25 MG/ML IJ SOLN: 6.2500 mg | INTRAMUSCULAR | Status: DC | PRN

## 2019-03-15 MED ORDER — METHOCARBAMOL 1000 MG/10ML IJ SOLN
500.0000 mg | Freq: Four times a day (QID) | INTRAVENOUS | Status: DC | PRN
  Filled 2019-03-15: qty 5

## 2019-03-15 MED ORDER — LIDOCAINE 2% (20 MG/ML) 5 ML SYRINGE
INTRAMUSCULAR | Status: AC
  Filled 2019-03-15: qty 5

## 2019-03-15 MED ORDER — PROMETHAZINE HCL 25 MG/ML IJ SOLN: 6.2500 mg | INTRAMUSCULAR | Status: DC | PRN

## 2019-03-15 MED ORDER — TRANEXAMIC ACID-NACL 1000-0.7 MG/100ML-% IV SOLN
INTRAVENOUS | Status: AC
  Filled 2019-03-15: qty 100

## 2019-03-15 MED ORDER — MENTHOL 3 MG MT LOZG: 1.0000 | LOZENGE | OROMUCOSAL | Status: DC | PRN

## 2019-03-15 MED ORDER — ROCURONIUM BROMIDE 100 MG/10ML IV SOLN
INTRAVENOUS | Status: DC | PRN
  Administered 2019-03-15: 40 mg via INTRAVENOUS

## 2019-03-15 MED ORDER — FENTANYL CITRATE (PF) 250 MCG/5ML IJ SOLN
INTRAMUSCULAR | Status: AC
  Filled 2019-03-15: qty 5

## 2019-03-15 MED ORDER — CEFAZOLIN SODIUM 1 G IJ SOLR
INTRAMUSCULAR | Status: AC
  Filled 2019-03-15: qty 20

## 2019-03-15 MED ORDER — PROPOFOL 10 MG/ML IV BOLUS
INTRAVENOUS | Status: AC
  Filled 2019-03-15: qty 20

## 2019-03-15 MED ORDER — LIDOCAINE 2% (20 MG/ML) 5 ML SYRINGE
INTRAMUSCULAR | Status: DC | PRN
  Administered 2019-03-15: 60 mg via INTRAVENOUS

## 2019-03-15 MED ORDER — FERROUS SULFATE 325 (65 FE) MG PO TABS
325.0000 mg | ORAL_TABLET | Freq: Three times a day (TID) | ORAL | Status: DC
  Administered 2019-03-15 – 2019-03-20 (×14): 325 mg via ORAL
  Filled 2019-03-15 (×14): qty 1

## 2019-03-15 MED ORDER — INSULIN ASPART 100 UNIT/ML ~~LOC~~ SOLN
SUBCUTANEOUS | Status: AC
  Filled 2019-03-15: qty 1

## 2019-03-15 MED ORDER — METHOCARBAMOL 500 MG PO TABS
500.0000 mg | ORAL_TABLET | Freq: Four times a day (QID) | ORAL | Status: DC | PRN
  Administered 2019-03-16 – 2019-03-18 (×6): 500 mg via ORAL
  Filled 2019-03-15 (×6): qty 1

## 2019-03-15 MED ORDER — DOCUSATE SODIUM 100 MG PO CAPS
100.0000 mg | ORAL_CAPSULE | Freq: Two times a day (BID) | ORAL | Status: DC
  Administered 2019-03-15 – 2019-03-20 (×9): 100 mg via ORAL
  Filled 2019-03-15 (×9): qty 1

## 2019-03-15 MED ORDER — ENOXAPARIN SODIUM 40 MG/0.4ML ~~LOC~~ SOLN
40.0000 mg | SUBCUTANEOUS | Status: DC
  Administered 2019-03-16 – 2019-03-17 (×2): 40 mg via SUBCUTANEOUS
  Filled 2019-03-15 (×2): qty 0.4

## 2019-03-15 MED ORDER — SUGAMMADEX SODIUM 200 MG/2ML IV SOLN
INTRAVENOUS | Status: DC | PRN
  Administered 2019-03-15: 200 mg via INTRAVENOUS

## 2019-03-15 MED ORDER — METOCLOPRAMIDE HCL 5 MG/ML IJ SOLN: 5.0000 mg | Freq: Three times a day (TID) | INTRAMUSCULAR | Status: DC | PRN

## 2019-03-15 MED ORDER — FENTANYL CITRATE (PF) 250 MCG/5ML IJ SOLN
INTRAMUSCULAR | Status: DC | PRN
  Administered 2019-03-15: 100 ug via INTRAVENOUS
  Administered 2019-03-15: 50 ug via INTRAVENOUS

## 2019-03-15 MED ORDER — ONDANSETRON HCL 4 MG/2ML IJ SOLN
INTRAMUSCULAR | Status: DC | PRN
  Administered 2019-03-15: 4 mg via INTRAVENOUS

## 2019-03-15 MED ORDER — HYDROMORPHONE HCL 2 MG PO TABS
2.0000 mg | ORAL_TABLET | ORAL | Status: DC | PRN
  Administered 2019-03-15 (×2): 2 mg via ORAL
  Administered 2019-03-16: 10:00:00 4 mg via ORAL
  Administered 2019-03-16 – 2019-03-18 (×6): 2 mg via ORAL
  Filled 2019-03-15 (×2): qty 1
  Filled 2019-03-15: qty 2
  Filled 2019-03-15 (×3): qty 1
  Filled 2019-03-15: qty 2
  Filled 2019-03-15 (×2): qty 1

## 2019-03-15 MED ORDER — INSULIN GLARGINE 100 UNIT/ML ~~LOC~~ SOLN
30.0000 [IU] | Freq: Every day | SUBCUTANEOUS | Status: DC
  Administered 2019-03-16: 30 [IU] via SUBCUTANEOUS
  Filled 2019-03-15 (×2): qty 0.3

## 2019-03-15 MED ORDER — CEFAZOLIN SODIUM-DEXTROSE 2-4 GM/100ML-% IV SOLN
2.0000 g | Freq: Four times a day (QID) | INTRAVENOUS | Status: AC
  Administered 2019-03-15 – 2019-03-16 (×2): 2 g via INTRAVENOUS
  Filled 2019-03-15 (×2): qty 100

## 2019-03-15 SURGICAL SUPPLY — 48 items
ADH SKN CLS APL DERMABOND .7 (GAUZE/BANDAGES/DRESSINGS) ×1
BRUSH FEMORAL CANAL (MISCELLANEOUS) IMPLANT
CABLE CERLAGE W/CRIMP 1.8 (Cable) ×4 IMPLANT
CABLE CERLAGE W/CRIMP 1.8MM (Cable) ×4 IMPLANT
COVER BACK TABLE 24X17X13 BIG (DRAPES) IMPLANT
COVER WAND RF STERILE (DRAPES) ×3 IMPLANT
DECANTER SPIKE VIAL GLASS SM (MISCELLANEOUS) ×3 IMPLANT
DERMABOND ADVANCED (GAUZE/BANDAGES/DRESSINGS) ×2
DERMABOND ADVANCED .7 DNX12 (GAUZE/BANDAGES/DRESSINGS) IMPLANT
DRAPE IMP U-DRAPE 54X76 (DRAPES) ×3 IMPLANT
DRAPE INCISE IOBAN 66X45 STRL (DRAPES) ×3 IMPLANT
DRAPE ORTHO SPLIT 77X108 STRL (DRAPES) ×6
DRAPE POUCH INSTRU U-SHP 10X18 (DRAPES) ×3 IMPLANT
DRAPE SURG ORHT 6 SPLT 77X108 (DRAPES) ×2 IMPLANT
DRSG AQUACEL AG ADV 3.5X14 (GAUZE/BANDAGES/DRESSINGS) ×2 IMPLANT
DRSG PAD ABDOMINAL 8X10 ST (GAUZE/BANDAGES/DRESSINGS) ×3 IMPLANT
DURAPREP 26ML APPLICATOR (WOUND CARE) ×3 IMPLANT
ELECT CAUTERY BLADE 6.4 (BLADE) ×3 IMPLANT
ELECT REM PT RETURN 9FT ADLT (ELECTROSURGICAL) ×3
ELECTRODE REM PT RTRN 9FT ADLT (ELECTROSURGICAL) ×1 IMPLANT
FILTER STRAW FLUID ASPIR (MISCELLANEOUS) ×3 IMPLANT
GAUZE SPONGE 4X4 12PLY STRL (GAUZE/BANDAGES/DRESSINGS) ×3 IMPLANT
GLOVE BIO SURGEON STRL SZ8.5 (GLOVE) ×3 IMPLANT
GOWN STRL REUS W/ TWL LRG LVL3 (GOWN DISPOSABLE) ×3 IMPLANT
GOWN STRL REUS W/TWL LRG LVL3 (GOWN DISPOSABLE) ×9
HANDPIECE INTERPULSE COAX TIP (DISPOSABLE)
IMMOBILIZER KNEE 22 UNIV (SOFTGOODS) ×3 IMPLANT
KIT BASIN OR (CUSTOM PROCEDURE TRAY) ×3 IMPLANT
KIT TURNOVER KIT B (KITS) ×3 IMPLANT
MANIFOLD NEPTUNE II (INSTRUMENTS) ×3 IMPLANT
NS IRRIG 1000ML POUR BTL (IV SOLUTION) ×6 IMPLANT
PACK TOTAL JOINT (CUSTOM PROCEDURE TRAY) ×3 IMPLANT
PACK UNIVERSAL I (CUSTOM PROCEDURE TRAY) ×3 IMPLANT
PAD ARMBOARD 7.5X6 YLW CONV (MISCELLANEOUS) ×6 IMPLANT
PRESSURIZER FEMORAL UNIV (MISCELLANEOUS) IMPLANT
SET HNDPC FAN SPRY TIP SCT (DISPOSABLE) IMPLANT
SPONGE LAP 4X18 RFD (DISPOSABLE) ×3 IMPLANT
SUT MNCRL AB 3-0 PS2 18 (SUTURE) ×2 IMPLANT
SUT VIC AB 1 CT1 27 (SUTURE) ×6
SUT VIC AB 1 CT1 27XBRD ANBCTR (SUTURE) IMPLANT
SUT VIC AB 2-0 CT1 27 (SUTURE) ×9
SUT VIC AB 2-0 CT1 TAPERPNT 27 (SUTURE) IMPLANT
SUT VLOC 180 0 24IN GS25 (SUTURE) ×2 IMPLANT
TOWEL GREEN STERILE (TOWEL DISPOSABLE) ×3 IMPLANT
TOWEL GREEN STERILE FF (TOWEL DISPOSABLE) ×3 IMPLANT
TOWER CARTRIDGE SMART MIX (DISPOSABLE) IMPLANT
TRAY FOLEY MTR SLVR 16FR STAT (SET/KITS/TRAYS/PACK) ×3 IMPLANT
WATER STERILE IRR 1000ML POUR (IV SOLUTION) ×6 IMPLANT

## 2019-03-15 NOTE — Transfer of Care (Signed)
Immediate Anesthesia Transfer of Care Note  Patient: Oscar Baker  Procedure(s) Performed: OPEN REDUCTION INTERNAL FIXATION (ORIF) PERIPROSTHETIC FRACTURE WITH ZIMMER CABLES (Right Hip)  Patient Location: PACU  Anesthesia Type:General  Level of Consciousness: drowsy  Airway & Oxygen Therapy: Patient Spontanous Breathing and Patient connected to face mask oxygen  Post-op Assessment: Report given to RN and Post -op Vital signs reviewed and stable  Post vital signs: Reviewed  Last Vitals:  Vitals Value Taken Time  BP 139/58 03/15/19 1451  Temp    Pulse 81 03/15/19 1455  Resp 22 03/15/19 1455  SpO2 100 % 03/15/19 1455  Vitals shown include unvalidated device data.  Last Pain:  Vitals:   03/15/19 0803  TempSrc:   PainSc: 3       Patients Stated Pain Goal: 3 ([phone])  Complications: No apparent anesthesia complications

## 2019-03-15 NOTE — Anesthesia Procedure Notes (Signed)
Procedure Name: Intubation Date/Time: 03/15/2019 1:05 PM Performed by: Janene Harvey, CRNA Pre-anesthesia Checklist: Patient identified, Emergency Drugs available, Suction available and Patient being monitored Patient Re-evaluated:Patient Re-evaluated prior to induction Oxygen Delivery Method: Circle system utilized Preoxygenation: Pre-oxygenation with 100% oxygen Induction Type: IV induction Ventilation: Mask ventilation without difficulty Laryngoscope Size: Mac and 4 Grade View: Grade I Tube type: Oral Tube size: 7.5 mm Number of attempts: 1 Airway Equipment and Method: Stylet and Oral airway Placement Confirmation: ETT inserted through vocal cords under direct vision,  positive ETCO2 and breath sounds checked- equal and bilateral Secured at: 22 cm Tube secured with: Tape Dental Injury: Teeth and Oropharynx as per pre-operative assessment

## 2019-03-15 NOTE — Progress Notes (Signed)
Patient ID: Oscar Baker, male   DOB: 1932-10-14, 85 y.o.   MRN: [id number] Subjective: Day of Surgery     Patient reports pain as moderate.  Objective:   VITALS:   Vitals:   03/14/19 2012 03/15/19 0416  BP: 117/75 118/63  Pulse: 66 62  Resp: 17 18  Temp: 98 F (36.7 C) (!) 97.4 F (36.3 C)  SpO2: 95% 97%    Neurovascular intact  LABS Recent Labs    03/13/19 0511 03/14/19 0400 03/15/19 0329  HGB 10.0* 9.7* 9.4*  HCT 29.5* 29.3* 28.6*  WBC 37.5* 37.7* 35.6*  PLT 263 353 406*    Recent Labs    03/13/19 0511 03/14/19 0400 03/15/19 0329  NA 133* 132* 131*  K 4.9 5.0 4.8  BUN 24* 26* 33*  CREATININE 0.96 0.92 0.88  GLUCOSE 276* 311* 268*    Recent Labs    03/14/19 0400 03/15/19 0329  INR 1.4* 1.2     Assessment/Plan: Day of Surgery Procedure(s) (LRB): OPEN REDUCTION INTERNAL FIXATION (ORIF) PERIPROSTHETIC FRACTURE WITH ZIMMER CABLES (Right)  Plan: NPO To OR at lunch today for procedure  Resume Coumadin post op PWB post op

## 2019-03-15 NOTE — Progress Notes (Signed)
ANTICOAGULATION CONSULT NOTE - Initial Consult  Pharmacy Consult for Coumadin Indication: atrial fibrillation  Allergies  Allergen Reactions  . Altace [Ramipril] Other (See Comments)    "throat felt like had a knot in it"  . Augmentin [Amoxicillin-Pot Clavulanate] Diarrhea    severe  . Codeine Nausea And Vomiting    Nausea and vomiting   . Simvastatin Other (See Comments)    Leg aches    Patient Measurements: Height: 5' 5.98" (167.6 cm) Weight: 204 lb (92.5 kg) IBW/kg (Calculated) : 63.76  Vital Signs: Temp: 97.1 F (36.2 C) (09/23 1622) BP: 106/57 (09/23 1622) Pulse Rate: 71 (09/23 1622)  Labs: Recent Labs    03/13/19 0511 03/13/19 2034 03/14/19 0400 03/15/19 0329  HGB 10.0*  --  9.7* 9.4*  HCT 29.5*  --  29.3* 28.6*  PLT 263  --  353 406*  LABPROT 17.8*  --  16.7* 14.9  INR 1.5*  --  1.4* 1.2  HEPARINUNFRC  --  0.15* 0.28*  --   CREATININE 0.96  --  0.92 0.88    Estimated Creatinine Clearance: 66.6 mL/min (by C-G formula based on SCr of 0.88 mg/dL).   Medical History: Past Medical History:  Diagnosis Date  . Asthma 1950's   history of  . Atrial fibrillation ([place])   . Bilateral carpal tunnel syndrome   . Bilateral lower extremity edema   . Bladder tumor   . Chronic systolic heart failure (HCC)    Echo 1/19: Mild LVH, EF 45-50, inf HK, MAC, severe LAE, severe RAE // Echo 7/15: Mild LVH, mod focal basal sept hypertrophy, EF 55-60, AV peak and mean 16/9, trivial MR, mod LAE, PASP 38  . CLL (chronic lymphocytic leukemia) [facility]) oncologist-  dr Ilene Qua--   dx [phone] ;  Lymphocytosis, CLL - per lov note 05-11-2017 currently under active survillance,  CT 04-17-2014 show very small lymphadenopathy, no indication for treatment  . Coronary artery disease    cardiologist-  dr Cathie Olden--  08-18-2017 Intermittant risk nuclear study w/ large area of inferior infartion with no evidence ishcemia   . 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 Diabetes mellitus type 2, noninsulin dependent  ([place])   . Elevated PSA    since prostatectomy but now resolved  . Hematuria 04/2017  . History of ear infection    Right  . History of MI (myocardial infarction)    per myoview nuclear study 08-18-2017 , unknown when  . History of shingles 08/2017   L ear and scalp, possible  . Hyperlipidemia   . Hypertension   . Ischemic cardiomyopathy 09/01/2017   Presumed +CAD with Nuclear stress test 08/18/17 - Inferior scar, no ischemia, intermediate risk // med management unless +angina or worse dyspnea  . OA (osteoarthritis)   . Pacemaker 02/08/2014   followed by dr g. taylor--  single chamber Biotronik due to SSS  . Permanent atrial fibrillation   . Pneumonia 2019   Left lung  . Prostate cancer [facility]) urologist-  dr Diona Fanti   dx 2004--  Gleason 8, PSA 10.45--  11-28-2002  s/p  radical prostatectomy;  recurrent w/ increasing PSA, started ADT treatment  . RBBB (right bundle branch block)   . Sick sinus syndrome ([place])    a-Flutter with episodes of bradycardia; S/P Biotronik (serial number [id number]) 02-08-2014  . Urinary incontinence   . Wears hearing aid in right ear    receiver and transmitter   Assessment:  Anticoag: Heparin for afib, no bolus. Coumadin PTA, INR 3.8 on  admit 9/17 pm. No reversal given, INR down to 1.5.  HL 0.15 on 1150>incr 1300>0.28>incr 1400>off for OR 9/23 for THA. INR 1.4 today PTA Coumadin: 5 mg days except 7.5 mg on Tues and Sat, last dose 9/17 PTA   Goal of Therapy:  INR 2-3 Monitor platelets by anticoagulation protocol: Yes   Plan:  Resume Coumadi 9/23 at 84m po x 1 tonight. Daily INR   Azuree Minish S. RAlford Highland PharmD, BCPS Clinical Staff Pharmacist REilene GhaziStillinger 03/15/2019,4:57 PM

## 2019-03-15 NOTE — Interval H&P Note (Signed)
History and Physical Interval Note:  03/15/2019 12:26 PM  Oscar Baker  has presented today for surgery, with the diagnosis of Right Peri-prosthetic hip Fracture.  The various methods of treatment have been discussed with the patient and family. After consideration of risks, benefits and other options for treatment, the patient has consented to  Procedure(s): OPEN REDUCTION INTERNAL FIXATION (ORIF) PERIPROSTHETIC FRACTURE WITH ZIMMER CABLES (Right) as a surgical intervention.  The patient's history has been reviewed, patient examined, no change in status, stable for surgery.  I have reviewed the patient's chart and labs.  Questions were answered to the patient's satisfaction.     Mauri Pole

## 2019-03-15 NOTE — Progress Notes (Signed)
PT Cancellation Note  Patient Details Name: EEAN MCCARRICK MRN: [id number] DOB: 11-29-1932   Cancelled Treatment:    Reason Eval/Treat Not Completed: Patient at procedure or test/unavailable.  Pt off the floor for surgery.  PT will check back tomorrow for re-evaluation.  Thanks,  Barbarann Ehlers. Jeri Rawlins, PT, DPT  Acute Rehabilitation [phone] pager [phone] office  @ [facility]: [phone]     Harvie Heck 03/15/2019, 12:48 PM

## 2019-03-15 NOTE — H&P (View-Only) (Signed)
Patient ID: Oscar Baker, male   DOB: 11/14/1932, 85 y.o.   MRN: [id number] Subjective: Day of Surgery     Patient reports pain as moderate.  Objective:   VITALS:   Vitals:   03/14/19 2012 03/15/19 0416  BP: 117/75 118/63  Pulse: 66 62  Resp: 17 18  Temp: 98 F (36.7 C) (!) 97.4 F (36.3 C)  SpO2: 95% 97%    Neurovascular intact  LABS Recent Labs    03/13/19 0511 03/14/19 0400 03/15/19 0329  HGB 10.0* 9.7* 9.4*  HCT 29.5* 29.3* 28.6*  WBC 37.5* 37.7* 35.6*  PLT 263 353 406*    Recent Labs    03/13/19 0511 03/14/19 0400 03/15/19 0329  NA 133* 132* 131*  K 4.9 5.0 4.8  BUN 24* 26* 33*  CREATININE 0.96 0.92 0.88  GLUCOSE 276* 311* 268*    Recent Labs    03/14/19 0400 03/15/19 0329  INR 1.4* 1.2     Assessment/Plan: Day of Surgery Procedure(s) (LRB): OPEN REDUCTION INTERNAL FIXATION (ORIF) PERIPROSTHETIC FRACTURE WITH ZIMMER CABLES (Right)  Plan: NPO To OR at lunch today for procedure  Resume Coumadin post op PWB post op

## 2019-03-15 NOTE — Anesthesia Preprocedure Evaluation (Addendum)
Anesthesia Evaluation  Patient identified by MRN, date of birth, ID band Patient awake    Reviewed: Allergy & Precautions, H&P , NPO status , Patient's Chart, lab work & pertinent test results  Airway Mallampati: III  TM Distance: >3 FB Neck ROM: Full    Dental  (+) Teeth Intact, Dental Advisory Given   Pulmonary asthma , pneumonia, former smoker,    Pulmonary exam normal breath sounds clear to auscultation       Cardiovascular hypertension, + CAD and +CHF  + dysrhythmias + pacemaker  Rhythm:Regular Rate:Normal     Neuro/Psych negative neurological ROS  negative psych ROS   GI/Hepatic negative GI ROS, Neg liver ROS,   Endo/Other  diabetes, Type 2, Oral Hypoglycemic Agents  Renal/GU negative Renal ROS  negative genitourinary   Musculoskeletal  (+) Arthritis , Osteoarthritis,    Abdominal   Peds  Hematology negative hematology ROS (+) anemia , CLL   Anesthesia Other Findings   Reproductive/Obstetrics negative OB ROS                           Anesthesia Physical  Anesthesia Plan  ASA: IV  Anesthesia Plan: General   Post-op Pain Management:    Induction: Intravenous  PONV Risk Score and Plan: 3 and Ondansetron and Treatment may vary due to age or medical condition  Airway Management Planned: Oral ETT  Additional Equipment:   Intra-op Plan:   Post-operative Plan: Extubation in OR  Informed Consent: I have reviewed the patients History and Physical, chart, labs and discussed the procedure including the risks, benefits and alternatives for the proposed anesthesia with the patient or authorized representative who has indicated his/her understanding and acceptance.   Patient has DNR.  Suspend DNR.   Dental advisory given  Plan Discussed with: CRNA  Anesthesia Plan Comments: (Discussed DNR with patient. He is )      Anesthesia Quick Evaluation

## 2019-03-15 NOTE — Brief Op Note (Signed)
03/09/2019 - 03/15/2019  12:28 PM  PATIENT:  Oscar Baker  85 y.o. male  PRE-OPERATIVE DIAGNOSIS:  Right Peri-prosthetic hip Fracture  POST-OPERATIVE DIAGNOSIS:  Right Peri-prosthetic hip Fracture  PROCEDURE:  Procedure(s): OPEN REDUCTION INTERNAL FIXATION (ORIF) PERIPROSTHETIC FRACTURE WITH ZIMMER CABLES (Right)  SURGEON:  Surgeon(s) and Role:    Paralee Cancel, MD - Primary  PHYSICIAN ASSISTANT: Danae Orleans, PA-C  ANESTHESIA:   general  EBL:  250 cc  BLOOD ADMINISTERED:none  DRAINS: none   LOCAL MEDICATIONS USED:  NONE  SPECIMEN:  No Specimen  DISPOSITION OF SPECIMEN:  N/A  COUNTS:  YES  TOURNIQUET:  * No tourniquets in log *  DICTATION: .Other Dictation: Dictation Number [id number]  PLAN OF CARE: Admit to inpatient   PATIENT DISPOSITION:  PACU - hemodynamically stable.   Delay start of Pharmacological VTE agent (>24hrs) due to surgical blood loss or risk of bleeding: no

## 2019-03-15 NOTE — Progress Notes (Signed)
PROGRESS NOTE    Oscar Baker  [id number] DOB: 12-12-1932 DOA: 03/09/2019 PCP: [name], MD   Brief Narrative:  Oscar Baker is a 85 y.o. male with history of atrial fibrillation, pacemaker placement for bradycardia, diastolic dysfunction, CHF, anemia, diabetes mellitus, prostate cancer who had a fall at home after tripping.  Fell on backwards and hit chest on the mid back.  Patient denied losing consciousness or having any chest pain shortness of breath palpitation.  In the ER x-rays and CT scan reveals a right hip periprosthetic fracture.  On-call orthopedic surgeon Dr. Alvan Dame was consulted.  On exam patient has mid back bruising CAT scan revealed mildly small hematoma which is not enlarging.  EKG shows atrial flutter rate controlled.  Labs reveal leukocytosis from known CLL mild anemia otherwise largely unremarkable.  INR was 3.8.  Coag negative.  He was admitted under hospitalist service.  Assessment & Plan:   Principal Problem:   Closed right hip fracture, initial encounter ([place]) Active Problems:   PAF (paroxysmal atrial fibrillation) (HCC)   Diabetes mellitus type 2, noninsulin dependent (HCC)   Laryngopharyngeal reflux   CLL (chronic lymphocytic leukemia) (HCC)   Type 2 diabetes mellitus ([place])  Right-sided periprosthetic hip fracture: Pain well controlled.  For some reason, his surgery was canceled yesterday.  He is scheduled to have the surgery at noon today.  Remains n.p.o.  Management deferred to orthopedics and we appreciate their help.  History of atrial fibrillation status post pacemaker placement: Rate is controlled.  INR subtherapeutic.  Heparin already stopped in anticipation for surgery.  CLL: Leukocytosis is stable.  Hyperlipidemia: Continue statin.  Type 2 diabetes mellitus: Blood sugar elevated.  Will increase Lantus to 30 units.  Continue SSI.  DVT prophylaxis: Heparin drip Code Status: DNR Family Communication: Plan of care discussed with patient at  bedside.  No family present. Disposition Plan: TBD  Consultants:   Orthopedics  Procedures:   None  Antimicrobials:   None   Subjective: Patient seen and examined.  Doing well.  Pain controlled.  Ready for surgery.  Objective: Vitals:   03/14/19 1525 03/14/19 2012 03/15/19 0416 03/15/19 0803  BP: 103/83 117/75 118/63 (!) 114/54  Pulse: 77 66 62 61  Resp: 18 17 18 17   Temp: 98.1 F (36.7 C) 98 F (36.7 C) (!) 97.4 F (36.3 C)   TempSrc: Oral Oral Oral   SpO2: 95% 95% 97% 97%  Weight: 92.5 kg     Height: 5\' 6"  (1.676 m)       Intake/Output Summary (Last 24 hours) at 03/15/2019 1028 Last data filed at 03/15/2019 0500 Gross per 24 hour  Intake 480 ml  Output 2100 ml  Net -1620 ml   Filed Weights   03/09/19 2341 03/14/19 1525  Weight: 92.5 kg 92.5 kg    Examination:  General exam: Appears calm and comfortable, obese Respiratory system: Clear to auscultation. Respiratory effort normal. Cardiovascular system: S1 & S2 heard, RRR. No JVD, murmurs, rubs, gallops or clicks. No pedal edema. Gastrointestinal system: Abdomen is nondistended, soft and nontender. No organomegaly or masses felt. Normal bowel sounds heard. Central nervous system: Alert and oriented. No focal neurological deficits. Skin: No rashes, lesions or ulcers.  Psychiatry: Judgement and insight appear normal. Mood & affect appropriate.   Data Reviewed: I have personally reviewed following labs and imaging studies  CBC: Recent Labs  Lab 03/09/19 2357 03/11/19 0448 03/12/19 0927 03/13/19 0511 03/14/19 0400 03/15/19 0329  WBC 33.7* 31.0* 35.3* 37.5* 37.7*  35.6*  NEUTROABS 6.4  --  7.7 10.9* 9.5* 6.9  HGB 12.2* 10.0* 10.1* 10.0* 9.7* 9.4*  HCT 37.7* 30.5* 30.9* 29.5* 29.3* 28.6*  MCV 101.6* 99.3 99.0 97.7 99.0 97.9  PLT 255 247 273 263 353 [id number]*   Basic Metabolic Panel: Recent Labs  Lab 03/10/19 0618 03/12/19 0927 03/13/19 0511 03/14/19 0400 03/15/19 0329  NA 137 133* 133* 132* 131*  K  4.6 5.0 4.9 5.0 4.8  CL 105 102 100 98 98  CO2 25 24 24 24 24   GLUCOSE 188* 278* 276* 311* 268*  BUN 17 22 24* 26* 33*  CREATININE 0.96 0.94 0.96 0.92 0.88  CALCIUM 8.6* 8.5* 8.5* 8.3* 8.7*   GFR: Estimated Creatinine Clearance: 66.6 mL/min (by C-G formula based on SCr of 0.88 mg/dL). Liver Function Tests: No results for input(s): AST, ALT, ALKPHOS, BILITOT, PROT, ALBUMIN in the last 168 hours. No results for input(s): LIPASE, AMYLASE in the last 168 hours. No results for input(s): AMMONIA in the last 168 hours. Coagulation Profile: Recent Labs  Lab 03/11/19 0955 03/12/19 0927 03/13/19 0511 03/14/19 0400 03/15/19 0329  INR 2.5* 1.8* 1.5* 1.4* 1.2   Cardiac Enzymes: No results for input(s): CKTOTAL, CKMB, CKMBINDEX, TROPONINI in the last 168 hours. BNP (last 3 results) No results for input(s): PROBNP in the last 8760 hours. HbA1C: No results for input(s): HGBA1C in the last 72 hours. CBG: Recent Labs  Lab 03/14/19 0639 03/14/19 1153 03/14/19 1604 03/14/19 2207 03/15/19 0621  GLUCAP 364* 332* 228* 278* 238*   Lipid Profile: No results for input(s): CHOL, HDL, LDLCALC, TRIG, CHOLHDL, LDLDIRECT in the last 72 hours. Thyroid Function Tests: No results for input(s): TSH, T4TOTAL, FREET4, T3FREE, THYROIDAB in the last 72 hours. Anemia Panel: No results for input(s): VITAMINB12, FOLATE, FERRITIN, TIBC, IRON, RETICCTPCT in the last 72 hours. Sepsis Labs: No results for input(s): PROCALCITON, LATICACIDVEN in the last 168 hours.  Recent Results (from the past 240 hour(s))  SARS CORONAVIRUS 2 (TAT 6-24 HRS) Nasopharyngeal Nasopharyngeal Swab     Status: None   Collection Time: 03/10/19  1:53 AM   Specimen: Nasopharyngeal Swab  Result Value Ref Range Status   SARS Coronavirus 2 NEGATIVE NEGATIVE Final    Comment: (NOTE) SARS-CoV-2 target nucleic acids are NOT DETECTED. The SARS-CoV-2 RNA is generally detectable in upper and lower respiratory specimens during the acute  phase of infection. Negative results do not preclude SARS-CoV-2 infection, do not rule out co-infections with other pathogens, and should not be used as the sole basis for treatment or other patient management decisions. Negative results must be combined with clinical observations, patient history, and epidemiological information. The expected result is Negative. Fact Sheet for Patients: SugarRoll.be Fact Sheet for Healthcare Providers: https://www.woods-mathews.com/ This test is not yet approved or cleared by the [place] FDA and  has been authorized for detection and/or diagnosis of SARS-CoV-2 by FDA under an Emergency Use Authorization (EUA). This EUA will remain  in effect (meaning this test can be used) for the duration of the COVID-19 declaration under Section 56 4(b)(1) of the Act, 21 U.S.C. section 360bbb-3(b)(1), unless the authorization is terminated or revoked sooner. Performed at [facility], [place] [address], [place], [place] [address]   Surgical pcr screen     Status: Abnormal   Collection Time: 03/10/19  3:39 PM   Specimen: Nasal Mucosa; Nasal Swab  Result Value Ref Range Status   MRSA, PCR NEGATIVE NEGATIVE Final   Staphylococcus aureus POSITIVE (A) NEGATIVE  Final    Comment: (NOTE) The Xpert SA Assay (FDA approved for NASAL specimens in patients 26 years of age and older), is one component of a comprehensive surveillance program. It is not intended to diagnose infection nor to guide or monitor treatment. Performed at [facility], [place] [address], [place], [place] [address]       Radiology Studies: No results found.  Scheduled Meds: . atorvastatin  20 mg Oral q1800  . carvedilol  3.125 mg Oral BID WC  . Chlorhexidine Gluconate Cloth  6 each Topical Daily  . fenofibrate  160 mg Oral Daily  . gabapentin  300 mg Oral TID  . insulin aspart  0-9 Units Subcutaneous TID WC  . insulin glargine  30 Units  Subcutaneous Daily  . multivitamin with minerals  1 tablet Oral Daily  . mupirocin ointment   Nasal BID  . vitamin B-12  1,000 mcg Oral Daily   Continuous Infusions: . lactated ringers 10 mL/hr at 03/14/19 1710     LOS: 5 days   Time spent: 25 minutes  Darliss Cheney, MD [facility] Pager [phone]  If 7PM-7AM, please contact night-coverage [email or web address] Password [facility] 03/15/2019, 10:28 AM

## 2019-03-16 ENCOUNTER — Encounter (HOSPITAL_COMMUNITY): Payer: Self-pay | Admitting: Orthopedic Surgery

## 2019-03-16 ENCOUNTER — Telehealth: Payer: Self-pay | Admitting: *Deleted

## 2019-03-16 LAB — GLUCOSE, CAPILLARY
Glucose-Capillary: 151 mg/dL — ABNORMAL HIGH (ref 70–99)
Glucose-Capillary: 239 mg/dL — ABNORMAL HIGH (ref 70–99)
Glucose-Capillary: 305 mg/dL — ABNORMAL HIGH (ref 70–99)
Glucose-Capillary: 267 mg/dL — ABNORMAL HIGH (ref 70–99)

## 2019-03-16 LAB — CBC WITH DIFFERENTIAL/PLATELET
Abs Immature Granulocytes: 0 10*3/uL (ref 0.00–0.07)
Basophils Absolute: 0 10*3/uL (ref 0.0–0.1)
Basophils Relative: 0 %
Eosinophils Absolute: 0.3 10*3/uL (ref 0.0–0.5)
Eosinophils Relative: 1 %
HCT: 26.9 % — ABNORMAL LOW (ref 39.0–52.0)
Hemoglobin: 8.9 g/dL — ABNORMAL LOW (ref 13.0–17.0)
Lymphocytes Relative: 57 %
Lymphs Abs: 18.9 10*3/uL — ABNORMAL HIGH (ref 0.7–4.0)
MCH: 32.8 pg (ref 26.0–34.0)
MCHC: 33.1 g/dL (ref 30.0–36.0)
MCV: 99.3 fL (ref 80.0–100.0)
Monocytes Absolute: 1.7 10*3/uL — ABNORMAL HIGH (ref 0.1–1.0)
Monocytes Relative: 5 %
Neutro Abs: 12.2 10*3/uL — ABNORMAL HIGH (ref 1.7–7.7)
Neutrophils Relative %: 37 %
Platelets: 404 10*3/uL — ABNORMAL HIGH (ref 150–400)
RBC: 2.71 MIL/uL — ABNORMAL LOW (ref 4.22–5.81)
RDW: 18.4 % — ABNORMAL HIGH (ref 11.5–15.5)
WBC: 33.1 10*3/uL — ABNORMAL HIGH (ref 4.0–10.5)
nRBC: 0.2 % (ref 0.0–0.2)

## 2019-03-16 LAB — BASIC METABOLIC PANEL
Anion gap: 6 (ref 5–15)
BUN: 29 mg/dL — ABNORMAL HIGH (ref 8–23)
CO2: 28 mmol/L (ref 22–32)
Calcium: 8.3 mg/dL — ABNORMAL LOW (ref 8.9–10.3)
Chloride: 98 mmol/L (ref 98–111)
Creatinine, Ser: 0.92 mg/dL (ref 0.61–1.24)
GFR calc Af Amer: >60 mL/min (ref >60)
GFR calc non Af Amer: >60 mL/min (ref >60)
Glucose, Bld: 173 mg/dL — ABNORMAL HIGH (ref 70–99)
Potassium: 4.5 mmol/L (ref 3.5–5.1)
Sodium: 132 mmol/L — ABNORMAL LOW (ref 135–145)

## 2019-03-16 LAB — PROTIME-INR
INR: 1.3 — ABNORMAL HIGH (ref 0.8–1.2)
Prothrombin Time: 15.5 seconds — ABNORMAL HIGH (ref 11.4–15.2)

## 2019-03-16 MED ORDER — WARFARIN SODIUM 5 MG PO TABS
10.0000 mg | ORAL_TABLET | Freq: Once | ORAL | Status: AC
  Administered 2019-03-16: 10 mg via ORAL
  Filled 2019-03-16: qty 2

## 2019-03-16 MED ORDER — METHOCARBAMOL 500 MG PO TABS: 500.0000 mg | ORAL_TABLET | Freq: Four times a day (QID) | ORAL | 0 refills | Status: DC | PRN

## 2019-03-16 MED ORDER — INSULIN GLARGINE 100 UNIT/ML ~~LOC~~ SOLN
35.0000 [IU] | Freq: Every day | SUBCUTANEOUS | Status: DC
  Administered 2019-03-17 – 2019-03-19 (×3): 35 [IU] via SUBCUTANEOUS
  Filled 2019-03-16 (×3): qty 0.35

## 2019-03-16 MED ORDER — HYDROMORPHONE HCL 2 MG PO TABS: 2.0000 mg | ORAL_TABLET | Freq: Four times a day (QID) | ORAL | 0 refills | Status: DC | PRN

## 2019-03-16 NOTE — Anesthesia Postprocedure Evaluation (Signed)
Anesthesia Post Note  Patient: Oscar Baker  Procedure(s) Performed: OPEN REDUCTION INTERNAL FIXATION (ORIF) PERIPROSTHETIC FRACTURE WITH ZIMMER CABLES (Right Hip)     Patient location during evaluation: PACU Anesthesia Type: General Level of consciousness: sedated and patient cooperative Pain management: pain level controlled Vital Signs Assessment: post-procedure vital signs reviewed and stable Respiratory status: spontaneous breathing Cardiovascular status: stable Anesthetic complications: no    Last Vitals:  Vitals:   03/16/19 1233 03/16/19 1701  BP: (!) 101/47 117/64  Pulse: 67 66  Resp: 18   Temp: 36.9 C   SpO2: 97% 90%    Last Pain:  Vitals:   03/16/19 1804  TempSrc:   PainSc: [place]

## 2019-03-16 NOTE — Plan of Care (Signed)
  Problem: Education: Goal: Knowledge of General Education information will improve Description: Including pain rating scale, medication(s)/side effects and non-pharmacologic comfort measures Outcome: Progressing   Problem: Clinical Measurements: Goal: Will remain free from infection Outcome: Progressing Goal: Respiratory complications will improve Outcome: Progressing   Problem: Activity: Goal: Risk for activity intolerance will decrease Outcome: Progressing   Problem: Nutrition: Goal: Adequate nutrition will be maintained Outcome: Progressing   Problem: Pain Managment: Goal: General experience of comfort will improve Outcome: Progressing   Problem: Safety: Goal: Ability to remain free from injury will improve Outcome: Progressing   Problem: Skin Integrity: Goal: Risk for impaired skin integrity will decrease Outcome: Progressing   Jenene Slicker, RN 03/16/2019 [phone]

## 2019-03-16 NOTE — Op Note (Signed)
NAME: Oscar Baker, Oscar Baker MEDICAL RECORD [id number] ACCOUNT [id number] DATE OF BIRTH:[birth year] FACILITY: [facility] LOCATION: [facility] PHYSICIAN:Saanvi Hakala Marian Sorrow, MD  OPERATIVE REPORT  DATE OF PROCEDURE:  03/15/2019  PREOPERATIVE DIAGNOSIS:  Right periprosthetic femur fracture, [place] B based on radiographic appearance of the fracture and component instability.  POSTOPERATIVE DIAGNOSIS:  Right periprosthetic femur fracture, [place] B based on radiographic appearance of the fracture and component stability.  FINDINGS:  See body of the operative note for full details of the procedure and findings.    PROCEDURE:  Open reduction and internal fixation of right periprosthetic femur fracture utilizing 4 Zimmer cables.  SURGEON:  Paralee Cancel, MD  ASSISTANT:  Danae Orleans PA-C.  Note that [name] was present for the entirety of the case from preoperative positioning, perioperative management of the operative extremity, general facilitation of the case and primary wound closure.  ANESTHESIA:  General.  SPECIMENS:  None.  ESTIMATED BLOOD LOSS:  About 200-250 mL.  DRAINS:  None.  COMPLICATIONS:  None apparent.  INDICATIONS FOR PROCEDURE:  The patient is an 85 year old gentleman, patient of mine, who presented to the emergency room on 09/17 after falling.  At that time, radiographic workup revealed that he had a periprosthetic fracture on the right hip.  He is  admitted to the hospital with an elevated INR at 3.5 due to Coumadin use for anticoagulation related to cardiac comorbidities.  He was admitted to the hospital with a potential attempt to try to treat this conservatively; however, the persistent pain and  challenges with immobilization made surgical intervention more of an indication.  We allowed his Coumadin to run out.  He was scheduled for surgery on the 22nd.  However, due to emergencies from a trauma standpoint.  His case was bumped into the 23rd.   The risks and benefits and  necessity of the procedure were discussed including the risk of nonunion, malunion, need for future surgeries.  The concerns were outlined and discussed with his wife due to recent multiple falls a day.  Consent will be  obtained for management of his hip fracture and trying to improve pain and functional ability.  PROCEDURE IN DETAIL:  The patient was brought to the operative theater.  Once adequate anesthesia, preoperative antibiotics administered, he was positioned into the left lateral decubitus position with the right hip up.  The right lower extremity was  prepped and draped in sterile fashion.  A timeout was performed identifying the patient, the planned procedure and extremity.  I utilized the distal aspect of his hip incision from his prior hip arthroplasty and extended this along the lateral side of  his thigh.  The iliotibial band was exposed and split.  The vastus lateralis was elevated off the posterior intermuscular septum and retracted anteriorly.  The fracture site was identified to extent.  Preoperatively, I measured the length of the stem to  be about 17 cm.  We used a ruler to make certain we got to the distal aspect.  Once I had this exposed, I was able to palpate the fracture and felt that the fracture was more significant than what the radiographs had indicated  For that reason, I did  decide to place 4 cables.  I placed 1 cable distal to the fracture site near the tip of the stem.  I then placed 3 cables around the fracture site from just below the lesser trochanter to the distal extent that was visualized laterally.  All cables were  tensioned appropriately, crimped and cut.  Any hemostasis from the perforating vessels were cauterized as much as possible.  There was some bone oozing as well as muscle oozing.  The wound was irrigated with normal saline solution.  The vastus lateralis  was laid back over top of the lateral aspect of the femur.  The iliotibial band was reapproximated  using #1 Vicryl and Stratafix suture.  The remaining wound was closed with 2-0 Vicryl and a running Monocryl stitch.  The wound was then cleaned, dried and  dressed sterilely using surgical glue and Aquacel dressing.  He was brought to the recovery room in stable condition, tolerating the procedure well.  Findings were reviewed.  We will have him be partial weightbearing for probably 6-8 weeks using walker.   He will be placed back on his Coumadin with bridging Lovenox.  Findings were reviewed.  TN/NUANCE  D:03/16/2019 T:03/16/2019 JOB:[id number]

## 2019-03-16 NOTE — Progress Notes (Addendum)
Nutrition Follow-up  DOCUMENTATION CODES:   Obesity unspecified  INTERVENTION:  Continue Magic cup TID with meals, each supplement provides 290 kcal and 9 grams of protein  Encourage adequate PO intake.   NUTRITION DIAGNOSIS:   Increased nutrient needs related to post-op healing as evidenced by estimated needs; ongoing  GOAL:   Patient will meet greater than or equal to 90% of their needs; met  MONITOR:   PO intake, Supplement acceptance, Weight trends, Labs, Skin, I & O's  REASON FOR ASSESSMENT:   Consult Assessment of nutrition requirement/status  ASSESSMENT:   Oscar Baker is a 85 y.o. male with history of atrial fibrillation, pacemaker placement for bradycardia, diastolic dysfunction, CHF, anemia, diabetes mellitus, prostate cancer had a fall at home after tripping.  Fell on backwards and hit chest on the mid back.  Patient denies losing consciousness or having any chest pain shortness of breath palpitation. Pt admitted with closed rt hip fracture.   PROCEDURE (9/23): OPEN REDUCTION INTERNAL FIXATION (ORIF) PERIPROSTHETIC FRACTURE WITH ZIMMER CABLES (Right)   Meal completion has been 100%. Intake adequate. Pt currently has Magic cup ordered with meals. RD to continue with current orders to aid in post op healing. Pt encouraged to eat his food at meals.  Labs and medications reviewed.   Diet Order:   Diet Order            Diet Carb Modified Fluid consistency: Thin; Room service appropriate? Yes  Diet effective now              EDUCATION NEEDS:   No education needs have been identified at this time  Skin:  Skin Assessment: Skin Integrity Issues: Skin Integrity Issues:: Incisions Incisions: R hip  Last BM:  9/21  Height:   Ht Readings from Last 1 Encounters:  03/15/19 5' 5.98" (1.676 m)    Weight:   Wt Readings from Last 1 Encounters:  03/15/19 92.5 kg    Ideal Body Weight:  64.5 kg  BMI:  Body mass index is 32.94 kg/m.  Estimated  Nutritional Needs:   Kcal:  1700-1900  Protein:  90-105 grams  Fluid:  >/= 1.7 L/day    Corrin Parker, MS, RD, LDN Pager # [phone] After hours/ weekend pager # [phone]

## 2019-03-16 NOTE — Telephone Encounter (Signed)
Pt's wife called/ pt was with wife while she was calling and stated that pt would not be able to come to his appointment [facility]. Pt is currently admitted in the hospital.  She stated she would call to reschedule his appointment later.

## 2019-03-16 NOTE — Progress Notes (Signed)
     Subjective: 1 Day Post-Op Procedure(s) (LRB): OPEN REDUCTION INTERNAL FIXATION (ORIF) PERIPROSTHETIC FRACTURE WITH ZIMMER CABLES (Right)   Patient reports pain as mild, pain controlled. No reported events throughout the night.  States that he feels ok now.  We discussed the procedure, findings and expectations moving forward. Discussed the need for PWB to allow the bone to heal.      Objective:   VITALS:   Vitals:   03/16/19 0041 03/16/19 0347  BP: (!) 97/57 (!) 115/56  Pulse: 60 75  Resp:  17  Temp:  (!) 97.5 F (36.4 C)  SpO2: 99% 99%    Dorsiflexion/Plantar flexion intact Incision: dressing C/D/I No cellulitis present Compartment soft  LABS Recent Labs    03/14/19 0400 03/15/19 0329 03/16/19 0244  HGB 9.7* 9.4* 8.9*  HCT 29.3* 28.6* 26.9*  WBC 37.7* 35.6* 33.1*  PLT 353 406* 404*    Recent Labs    03/14/19 0400 03/15/19 0329 03/16/19 0244  NA 132* 131* 132*  K 5.0 4.8 4.5  BUN 26* 33* 29*  CREATININE 0.92 0.88 0.92  GLUCOSE 311* 268* 173*     Assessment/Plan: 1 Day Post-Op Procedure(s) (LRB): OPEN REDUCTION INTERNAL FIXATION (ORIF) PERIPROSTHETIC FRACTURE WITH ZIMMER CABLES (Right) Advance diet Up with therapy, PWB 50% right leg Discharge disposition TBD, states that he is looking forward to getting home  Ortho recommendations:  Coumadin for anticoagulation, on prior to surgery.  Dilaudid for pain management, stated allergy to codeine (Rx written).  Robaxin for muscle spasms (Rx written)  MiraLax and Colace for constipation  Iron 325 mg tid for 2-3 weeks   PWB 50%  on the right leg.  Dressing to remain in place until follow in clinic in 2 weeks.  Dressing is waterproof and may shower with it in place.  Follow up in 2 weeks at [facility]. Follow up with OLIN,Yessica Putnam D in 2 weeks.  Contact information:  [facility] [address], [address] [place]   PAC  03/16/2019, 7:43 AM

## 2019-03-16 NOTE — Progress Notes (Signed)
PROGRESS NOTE    Oscar Baker  [id number] DOB: 25-Apr-1933 DOA: 03/09/2019 PCP: [name], MD   Brief Narrative:  Oscar Baker is a 85 y.o. male with history of atrial fibrillation, pacemaker placement for bradycardia, diastolic dysfunction, CHF, anemia, diabetes mellitus, prostate cancer who had a fall at home after tripping.  Fell on backwards and hit chest on the mid back.  Patient denied losing consciousness or having any chest pain shortness of breath palpitation.  In the ER x-rays and CT scan reveals a right hip periprosthetic fracture.  On-call orthopedic surgeon Dr. Alvan Dame was consulted.  On exam patient has mid back bruising CAT scan revealed mildly small hematoma which is not enlarging.  EKG shows atrial flutter rate controlled.  Labs reveal leukocytosis from known CLL mild anemia otherwise largely unremarkable.  INR was 3.8.  Coag negative.  He was admitted under hospitalist service.  Orthopedic consulted and he underwent open reduction/internal fixation periprosthetic fracture with similar cables on 03/15/2019.  Assessment & Plan:   Principal Problem:   Closed right hip fracture, initial encounter ([place]) Active Problems:   PAF (paroxysmal atrial fibrillation) ([place])   Diabetes mellitus type 2, noninsulin dependent (HCC)   Laryngopharyngeal reflux   CLL (chronic lymphocytic leukemia) (HCC)   Type 2 diabetes mellitus (HCC)  Right-sided periprosthetic hip fracture: Postoperative day 1 status post ORIF periprosthetic fracture.  Pain management and rest of the management per orthopedics.  History of atrial fibrillation status post pacemaker placement: Rate is controlled.  INR subtherapeutic.  Has been started back on Coumadin last night.  Orthopedics has placed him on DVT prophylaxis Lovenox.  CLL: Leukocytosis is stable.  Hyperlipidemia: Continue statin.  Type 2 diabetes mellitus: Blood sugar slightly elevated.  Will increase Lantus to 30 units and continue SSI.  DVT  prophylaxis: Heparin drip Code Status: DNR Family Communication: Plan of care discussed with patient at bedside.  No family present. Disposition Plan: TBD  Consultants:   Orthopedics  Procedures:   ORIF periprosthetic right hip 03/15/2019.  Antimicrobials:   None   Subjective: Patient seen and examined.  Complains of right hip pain.  No new complaint.  Remains alert and oriented.  Objective: Vitals:   03/15/19 2051 03/16/19 0041 03/16/19 0347 03/16/19 0758  BP: (!) 102/59 (!) 97/57 (!) 115/56   Pulse: 73 60 75 72  Resp: 17  17   Temp: (!) 97.5 F (36.4 C)  (!) 97.5 F (36.4 C)   TempSrc: Oral  Oral   SpO2: 100% 99% 99% 93%  Weight:      Height:        Intake/Output Summary (Last 24 hours) at 03/16/2019 0809 Last data filed at 03/16/2019 0600 Gross per 24 hour  Intake 911.5 ml  Output 650 ml  Net 261.5 ml   Filed Weights   03/09/19 2341 03/14/19 1525 03/15/19 1125  Weight: 92.5 kg 92.5 kg 92.5 kg    Examination:  General exam: Appears calm and comfortable, obese Respiratory system: Clear to auscultation. Respiratory effort normal. Cardiovascular system: S1 & S2 heard, RRR. No JVD, murmurs, rubs, gallops or clicks. No pedal edema. Gastrointestinal system: Abdomen is nondistended, soft and nontender. No organomegaly or masses felt. Normal bowel sounds heard. Central nervous system: Alert and oriented. No focal neurological deficits. Skin: No rashes, lesions or ulcers.  Psychiatry: Judgement and insight appear poor, mood & affect appropriate.   Data Reviewed: I have personally reviewed following labs and imaging studies  CBC: Recent Labs  Lab 03/12/19  [id number] 03/13/19 0511 03/14/19 0400 03/15/19 0329 03/16/19 0244  WBC 35.3* 37.5* 37.7* 35.6* 33.1*  NEUTROABS 7.7 10.9* 9.5* 6.9 12.2*  HGB 10.1* 10.0* 9.7* 9.4* 8.9*  HCT 30.9* 29.5* 29.3* 28.6* 26.9*  MCV 99.0 97.7 99.0 97.9 99.3  PLT 273 263 353 406* [id number]*   Basic Metabolic Panel: Recent Labs  Lab  03/12/19 0927 03/13/19 0511 03/14/19 0400 03/15/19 0329 03/16/19 0244  NA 133* 133* 132* 131* 132*  K 5.0 4.9 5.0 4.8 4.5  CL 102 100 98 98 98  CO2 24 24 24 24 28   GLUCOSE 278* 276* 311* 268* 173*  BUN 22 24* 26* 33* 29*  CREATININE 0.94 0.96 0.92 0.88 0.92  CALCIUM 8.5* 8.5* 8.3* 8.7* 8.3*   GFR: Estimated Creatinine Clearance: 63.7 mL/min (by C-G formula based on SCr of 0.92 mg/dL). Liver Function Tests: No results for input(s): AST, ALT, ALKPHOS, BILITOT, PROT, ALBUMIN in the last 168 hours. No results for input(s): LIPASE, AMYLASE in the last 168 hours. No results for input(s): AMMONIA in the last 168 hours. Coagulation Profile: Recent Labs  Lab 03/12/19 0927 03/13/19 0511 03/14/19 0400 03/15/19 0329 03/16/19 0244  INR 1.8* 1.5* 1.4* 1.2 1.3*   Cardiac Enzymes: No results for input(s): CKTOTAL, CKMB, CKMBINDEX, TROPONINI in the last 168 hours. BNP (last 3 results) No results for input(s): PROBNP in the last 8760 hours. HbA1C: No results for input(s): HGBA1C in the last 72 hours. CBG: Recent Labs  Lab 03/15/19 1059 03/15/19 1524 03/15/19 1642 03/15/19 2158 03/16/19 0637  GLUCAP 186* 237* 181* 188* 151*   Lipid Profile: No results for input(s): CHOL, HDL, LDLCALC, TRIG, CHOLHDL, LDLDIRECT in the last 72 hours. Thyroid Function Tests: No results for input(s): TSH, T4TOTAL, FREET4, T3FREE, THYROIDAB in the last 72 hours. Anemia Panel: No results for input(s): VITAMINB12, FOLATE, FERRITIN, TIBC, IRON, RETICCTPCT in the last 72 hours. Sepsis Labs: No results for input(s): PROCALCITON, LATICACIDVEN in the last 168 hours.  Recent Results (from the past 240 hour(s))  SARS CORONAVIRUS 2 (TAT 6-24 HRS) Nasopharyngeal Nasopharyngeal Swab     Status: None   Collection Time: 03/10/19  1:53 AM   Specimen: Nasopharyngeal Swab  Result Value Ref Range Status   SARS Coronavirus 2 NEGATIVE NEGATIVE Final    Comment: (NOTE) SARS-CoV-2 target nucleic acids are NOT  DETECTED. The SARS-CoV-2 RNA is generally detectable in upper and lower respiratory specimens during the acute phase of infection. Negative results do not preclude SARS-CoV-2 infection, do not rule out co-infections with other pathogens, and should not be used as the sole basis for treatment or other patient management decisions. Negative results must be combined with clinical observations, patient history, and epidemiological information. The expected result is Negative. Fact Sheet for Patients: SugarRoll.be Fact Sheet for Healthcare Providers: https://www.woods-mathews.com/ This test is not yet approved or cleared by the [place] FDA and  has been authorized for detection and/or diagnosis of SARS-CoV-2 by FDA under an Emergency Use Authorization (EUA). This EUA will remain  in effect (meaning this test can be used) for the duration of the COVID-19 declaration under Section 56 4(b)(1) of the Act, 21 U.S.C. section 360bbb-3(b)(1), unless the authorization is terminated or revoked sooner. Performed at [facility], [place] [address] [place],  [address]   Surgical pcr screen     Status: Abnormal   Collection Time: 03/10/19  3:39 PM   Specimen: Nasal Mucosa; Nasal Swab  Result Value Ref Range Status   MRSA, PCR NEGATIVE NEGATIVE  Final   Staphylococcus aureus POSITIVE (A) NEGATIVE Final    Comment: (NOTE) The Xpert SA Assay (FDA approved for NASAL specimens in patients 29 years of age and older), is one component of a comprehensive surveillance program. It is not intended to diagnose infection nor to guide or monitor treatment. Performed at [facility], [place] [address] [place], [place] [address]       Radiology Studies: Dg Hip Port Unilat With Pelvis 1v Right  Result Date: 03/15/2019 CLINICAL DATA:  Status post surgical repair of right hip fracture. EXAM: DG HIP (WITH OR WITHOUT PELVIS) 1V PORT RIGHT COMPARISON:   Radiographs June 09, 2019. FINDINGS: Patient is status post surgical fixation of periprosthetic fracture involving the proximal right femur. Status post right total hip arthroplasty. IMPRESSION: Status post surgical internal fixation of proximal right femoral periprosthetic fracture. Electronically Signed   By: Marijo Conception M.D.   On: 03/15/2019 16:15    Scheduled Meds: . acetaminophen  1,000 mg Oral Q6H  . atorvastatin  20 mg Oral q1800  . carvedilol  3.125 mg Oral BID WC  . Chlorhexidine Gluconate Cloth  6 each Topical Daily  . docusate sodium  100 mg Oral BID  . enoxaparin (LOVENOX) injection  40 mg Subcutaneous Q24H  . fenofibrate  160 mg Oral Daily  . ferrous sulfate  325 mg Oral TID PC  . gabapentin  300 mg Oral TID  . insulin aspart  0-9 Units Subcutaneous TID WC  . insulin glargine  30 Units Subcutaneous Daily  . multivitamin with minerals  1 tablet Oral Daily  . mupirocin ointment   Nasal BID  . vitamin B-12  1,000 mcg Oral Daily  . Warfarin - Pharmacist Dosing Inpatient   Does not apply q1800   Continuous Infusions: . lactated ringers 10 mL/hr at 03/14/19 1710  . lactated ringers 10 mL/hr at 03/15/19 1136  . methocarbamol (ROBAXIN) IV       LOS: 6 days   Time spent 26 minutes  Darliss Cheney, MD [facility] Pager [phone]  If 7PM-7AM, please contact night-coverage [email or web address] Password [email or web address] 03/16/2019, 8:09 AM

## 2019-03-16 NOTE — Progress Notes (Signed)
[place] for Coumadin Indication: atrial fibrillation  Allergies  Allergen Reactions  . Altace [Ramipril] Other (See Comments)    "throat felt like had a knot in it"  . Augmentin [Amoxicillin-Pot Clavulanate] Diarrhea    severe  . Codeine Nausea And Vomiting    Nausea and vomiting   . Simvastatin Other (See Comments)    Leg aches    Patient Measurements: Height: 5' 5.98" (167.6 cm) Weight: 204 lb (92.5 kg) IBW/kg (Calculated) : 63.76  Vital Signs: Temp: 97.8 F (36.6 C) (09/24 0813) Temp Source: Oral (09/24 0813) BP: 111/65 (09/24 0813) Pulse Rate: 62 (09/24 0813)  Labs: Recent Labs    03/13/19 2034  03/14/19 0400 03/15/19 0329 03/16/19 0244  HGB  --    < > 9.7* 9.4* 8.9*  HCT  --   --  29.3* 28.6* 26.9*  PLT  --   --  353 406* 404*  LABPROT  --   --  16.7* 14.9 15.5*  INR  --   --  1.4* 1.2 1.3*  HEPARINUNFRC 0.15*  --  0.28*  --   --   CREATININE  --   --  0.92 0.88 0.92   < > = values in this interval not displayed.    Estimated Creatinine Clearance: 63.7 mL/min (by C-G formula based on SCr of 0.92 mg/dL).   Medical History: Past Medical History:  Diagnosis Date  . Asthma 1950's   history of  . Atrial fibrillation ([place])   . Bilateral carpal tunnel syndrome   . Bilateral lower extremity edema   . Bladder tumor   . Chronic systolic heart failure (HCC)    Echo 1/19: Mild LVH, EF 45-50, inf HK, MAC, severe LAE, severe RAE // Echo 7/15: Mild LVH, mod focal basal sept hypertrophy, EF 55-60, AV peak and mean 16/9, trivial MR, mod LAE, PASP 38  . CLL (chronic lymphocytic leukemia) [facility]) oncologist-  dr Ilene Qua--   dx [phone] ;  Lymphocytosis, CLL - per lov note 05-11-2017 currently under active survillance,  CT 04-17-2014 show very small lymphadenopathy, no indication for treatment  . Coronary artery disease    cardiologist-  dr Cathie Olden--  08-18-2017 Intermittant risk nuclear study w/ large area of inferior infartion with  no evidence ishcemia   . 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 Diabetes mellitus type 2, noninsulin dependent ([place])   . Elevated PSA    since prostatectomy but now resolved  . Hematuria 04/2017  . History of ear infection    Right  . History of MI (myocardial infarction)    per myoview nuclear study 08-18-2017 , unknown when  . History of shingles 08/2017   L ear and scalp, possible  . Hyperlipidemia   . Hypertension   . Ischemic cardiomyopathy 09/01/2017   Presumed +CAD with Nuclear stress test 08/18/17 - Inferior scar, no ischemia, intermediate risk // med management unless +angina or worse dyspnea  . OA (osteoarthritis)   . Pacemaker 02/08/2014   followed by dr g. taylor--  single chamber Biotronik due to SSS  . Permanent atrial fibrillation   . Pneumonia 2019   Left lung  . Prostate cancer [facility]) urologist-  dr Diona Fanti   dx 2004--  Gleason 8, PSA 10.45--  11-28-2002  s/p  radical prostatectomy;  recurrent w/ increasing PSA, started ADT treatment  . RBBB (right bundle branch block)   . Sick sinus syndrome (HCC)    a-Flutter with episodes of bradycardia; S/P Biotronik (serial number  [id number]) 02-08-2014  . Urinary incontinence   . Wears hearing aid in right ear    receiver and transmitter   Assessment:  Anticoag:Coumadin PTA, INR 3.8 on admit 9/17 pm.  PTA Coumadin: 5 mg days except 7.5 mg on Tues and Sat, last dose 9/17 PTA Resumed Coumadin on 9/23 post surgery  Receiving ppx Lovenox dosing at this time   Goal of Therapy:  INR 2-3 Monitor platelets by anticoagulation protocol: Yes   Plan:  - INR subtherapeutic  - Will give Coumadin 10 mg x 1 dose  - Daily CBC and PT/INR    Oscar Baker, PharmD, BCPS Clinical Pharmacist 03/16/2019,8:48 AM

## 2019-03-16 NOTE — Evaluation (Signed)
Physical Therapy Evaluation Patient Details Name: Oscar Baker MRN: [id number] DOB: Sep 24, 1932 Today's Date: 03/16/2019   History of Present Illness  85 yo male with onset of trip at home resulted in fall and fracture of R femur adjacent to prosthetic components of hip.  MD performed ORIF on 9/23, MD upgraded to [facility] on RLE.  PMHx:  R BBB, shingles, HTN, pacemaker, CHF, DM, prostate and bladder CA, MI, PAF, CLL  Clinical Impression  Pt is up to walk sidesteps to chair, but is fearful and weak.  Has restricted WB on RLE but is not comfortable enough to break this precaution.  Follow acutely for this weakness and difficulty with managing RW, transfers and the immob on RLE that is controlling and supporting RLE.  Pt is with his wife at time of eval and so will pursue CIR for him with her agreement, pt is hoping to go directly home.  This is not likely and pt is too effortful to assist for her ability.  Follow up after CIR with home therapy as previously discussed with pt.     Follow Up Recommendations CIR    Equipment Recommendations  None recommended by PT    Recommendations for Other Services Rehab consult     Precautions / Restrictions Precautions Precautions: Fall Precaution Comments: premedicate Restrictions Weight Bearing Restrictions: Yes RLE Weight Bearing: Partial weight bearing RLE Partial Weight Bearing Percentage or Pounds: 50      Mobility  Bed Mobility Overal bed mobility: Needs Assistance Bed Mobility: Supine to Sit Rolling: Mod assist   Supine to sit: Max assist;Mod assist        Transfers Overall transfer level: Needs assistance Equipment used: Rolling walker (2 wheeled);1 person hand held assist Transfers: Sit to/from Stand Sit to Stand: Max assist;From elevated surface         General transfer comment: pt is slow to bring RLE under him  Ambulation/Gait             General Gait Details: gait was really steps to get to chair  Stairs             Wheelchair Mobility    Modified Rankin (Stroke Patients Only)       Balance Overall balance assessment: History of Falls;Needs assistance Sitting-balance support: Feet supported;Bilateral upper extremity supported Sitting balance-Leahy Scale: Fair     Standing balance support: Bilateral upper extremity supported;During functional activity Standing balance-Leahy Scale: Poor                               Pertinent Vitals/Pain Pain Assessment: Faces Faces Pain Scale: Hurts even more Pain Location: R hip and leg Pain Descriptors / Indicators: Grimacing;Operative site guarding Pain Intervention(s): Limited activity within patient's tolerance;Monitored during session;Premedicated before session;Repositioned;Patient requesting pain meds-RN notified    Home Living Family/patient expects to be discharged to:: Inpatient rehab Living Arrangements: Spouse/significant other Available Help at Discharge: Family;Available 24 hours/day Type of Home: Apartment Home Access: Level entry     Home Layout: One level Home Equipment: Grab bars - toilet;Grab bars - tub/shower;Walker - 2 wheels Additional Comments: Pt is demonstrating poor control of RLE despite previous level of I    Prior Function Level of Independence: Independent with assistive device(s)         Comments: RW at home with pt being able to go out in community prev     Hand Dominance   Dominant Hand: Right  Extremity/Trunk Assessment   Upper Extremity Assessment Upper Extremity Assessment: LUE deficits/detail LUE Deficits / Details: shoulder weakness    Lower Extremity Assessment Lower Extremity Assessment: RLE deficits/detail RLE Deficits / Details: new ORIF RLE: Unable to fully assess due to pain;Unable to fully assess due to immobilization RLE Sensation: decreased proprioception RLE Coordination: decreased fine motor;decreased gross motor    Cervical / Trunk Assessment Cervical /  Trunk Assessment: Kyphotic  Communication   Communication: HOH  Cognition Arousal/Alertness: Awake/alert Behavior During Therapy: WFL for tasks assessed/performed Overall Cognitive Status: Within Functional Limits for tasks assessed                                 General Comments: able to discuss home but wife there to addi details      General Comments General comments (skin integrity, edema, etc.): pt was up to side of bed with immob on RLE and was able to sidestep and slide L foot due to difficulty using RLE to step    Exercises     Assessment/Plan    PT Assessment Patient needs continued PT services  PT Problem List Decreased strength;Decreased range of motion;Decreased activity tolerance;Decreased balance;Decreased mobility;Decreased coordination;Decreased knowledge of use of DME;Decreased safety awareness;Decreased skin integrity;Pain;Obesity       PT Treatment Interventions DME instruction;Gait training;Stair training;Functional mobility training;Therapeutic activities;Therapeutic exercise;Balance training;Neuromuscular re-education;Patient/family education    PT Goals (Current goals can be found in the Care Plan section)  Acute Rehab PT Goals Patient Stated Goal: to get home as soon as possible PT Goal Formulation: With patient/family Time For Goal Achievement: 03/30/19 Potential to Achieve Goals: Fair    Frequency Min 3X/week   Barriers to discharge Inaccessible home environment;Decreased caregiver support home with wife unable to assist him    Co-evaluation               AM-PAC PT "6 Clicks" Mobility  Outcome Measure Help needed turning from your back to your side while in a flat bed without using bedrails?: A Lot Help needed moving from lying on your back to sitting on the side of a flat bed without using bedrails?: A Lot Help needed moving to and from a bed to a chair (including a wheelchair)?: A Lot Help needed standing up from a chair  using your arms (e.g., wheelchair or bedside chair)?: A Lot Help needed to walk in hospital room?: A Lot Help needed climbing 3-5 steps with a railing? : Total 6 Click Score: 11    End of Session Equipment Utilized During Treatment: Gait belt Activity Tolerance: Treatment limited secondary to medical complications (Comment);Patient limited by pain Patient left: in chair;with call bell/phone within reach;with nursing/sitter in room;with family/visitor present Nurse Communication: Mobility status PT Visit Diagnosis: Unsteadiness on feet (R26.81);Muscle weakness (generalized) (M62.81);History of falling (Z91.81)    Time: 1125-1209 PT Time Calculation (min) (ACUTE ONLY): 44 min   Charges:   PT Evaluation $PT Eval Moderate Complexity: 1 Mod PT Treatments $Therapeutic Activity: 23-37 mins       Ramond Dial 03/16/2019, 5:06 PM   Mee Hives, PT MS Acute Rehab Dept. Number: [place] and [place]

## 2019-03-17 LAB — BASIC METABOLIC PANEL
Anion gap: 7 (ref 5–15)
BUN: 20 mg/dL (ref 8–23)
CO2: 28 mmol/L (ref 22–32)
Calcium: 8.5 mg/dL — ABNORMAL LOW (ref 8.9–10.3)
Chloride: 97 mmol/L — ABNORMAL LOW (ref 98–111)
Creatinine, Ser: 0.92 mg/dL (ref 0.61–1.24)
GFR calc Af Amer: >60 mL/min (ref >60)
GFR calc non Af Amer: >60 mL/min (ref >60)
Glucose, Bld: 225 mg/dL — ABNORMAL HIGH (ref 70–99)
Potassium: 4.9 mmol/L (ref 3.5–5.1)
Sodium: 132 mmol/L — ABNORMAL LOW (ref 135–145)

## 2019-03-17 LAB — CBC WITH DIFFERENTIAL/PLATELET
Abs Immature Granulocytes: 0.57 10*3/uL — ABNORMAL HIGH (ref 0.00–0.07)
Basophils Absolute: 0.1 10*3/uL (ref 0.0–0.1)
Basophils Relative: 0 %
Eosinophils Absolute: 0.2 10*3/uL (ref 0.0–0.5)
Eosinophils Relative: 1 %
HCT: 27.8 % — ABNORMAL LOW (ref 39.0–52.0)
Hemoglobin: 9.2 g/dL — ABNORMAL LOW (ref 13.0–17.0)
Immature Granulocytes: 2 %
Lymphocytes Relative: 68 %
Lymphs Abs: 25.6 10*3/uL — ABNORMAL HIGH (ref 0.7–4.0)
MCH: 32.9 pg (ref 26.0–34.0)
MCHC: 33.1 g/dL (ref 30.0–36.0)
MCV: 99.3 fL (ref 80.0–100.0)
Monocytes Absolute: 3.3 10*3/uL — ABNORMAL HIGH (ref 0.1–1.0)
Monocytes Relative: 9 %
Neutro Abs: 7.4 10*3/uL (ref 1.7–7.7)
Neutrophils Relative %: 20 %
Platelets: 462 10*3/uL — ABNORMAL HIGH (ref 150–400)
RBC: 2.8 MIL/uL — ABNORMAL LOW (ref 4.22–5.81)
RDW: 18.5 % — ABNORMAL HIGH (ref 11.5–15.5)
WBC: 37.2 10*3/uL — ABNORMAL HIGH (ref 4.0–10.5)
nRBC: 0.2 % (ref 0.0–0.2)

## 2019-03-17 LAB — GLUCOSE, CAPILLARY
Glucose-Capillary: 220 mg/dL — ABNORMAL HIGH (ref 70–99)
Glucose-Capillary: 264 mg/dL — ABNORMAL HIGH (ref 70–99)
Glucose-Capillary: 192 mg/dL — ABNORMAL HIGH (ref 70–99)
Glucose-Capillary: 137 mg/dL — ABNORMAL HIGH (ref 70–99)

## 2019-03-17 LAB — PROTIME-INR
INR: 2.1 — ABNORMAL HIGH (ref 0.8–1.2)
Prothrombin Time: 23.5 seconds — ABNORMAL HIGH (ref 11.4–15.2)

## 2019-03-17 NOTE — TOC Progression Note (Signed)
Transition of Care [facility]) - Progression Note    Patient Details  Name: Oscar Baker MRN: [id number] Date of Birth: 1932-08-10  Transition [facility]) CM/SW [place], LCSW Phone Number: 03/17/2019, 9:57 AM  Clinical Narrative:     PT is now recommending CIR- CSW will continue to follow and watch for inpatient rehabs notes.   Expected Discharge Plan: Skilled Nursing Facility(SNF vs. CIR) Barriers to Discharge: Continued Medical Work up  Expected Discharge Plan and Services Expected Discharge Plan: Skilled Nursing Facility(SNF vs. CIR) In-house Referral: Clinical Social Work     Living arrangements for the past 2 months: Single Family Home                 DME Arranged: N/A         HH Arranged: NA           Social Determinants of Health (SDOH) Interventions    Readmission Risk Interventions No flowsheet data found.

## 2019-03-17 NOTE — Progress Notes (Signed)
Physical Therapy Treatment Patient Details Name: Oscar Baker MRN: [id number] DOB: March 24, 1933 Today's Date: 03/17/2019    History of Present Illness 85 yo male with onset of trip at home resulted in fall and fracture of R femur adjacent to prosthetic components of hip.  MD performed ORIF on 9/23, MD upgraded to [facility] on RLE.  PMHx:  R BBB, shingles, HTN, pacemaker, CHF, DM, prostate and bladder CA, MI, PAF, CLL    PT Comments    Pt tolerated treatment well, requiring decreased physical assistance during bed mobility and transfers. PT educating pt on improved transfer technique to reduce strain on LE musculature. Pt continues to demonstrate deficits in functional mobility, gait, balance, power, and endurance, and will benefit from continued acute PT services to reduce falls risk and restore his prior level of function. Recommendations: D/C to CIR  Follow Up Recommendations  CIR     Equipment Recommendations  None recommended by PT    Recommendations for Other Services       Precautions / Restrictions Precautions Precautions: Fall Restrictions Weight Bearing Restrictions: Yes RLE Weight Bearing: Partial weight bearing RLE Partial Weight Bearing Percentage or Pounds: 50    Mobility  Bed Mobility Overal bed mobility: Needs Assistance Bed Mobility: Supine to Sit     Supine to sit: Mod assist        Transfers Overall transfer level: Needs assistance Equipment used: Rolling walker (2 wheeled) Transfers: Sit to/from Stand Sit to Stand: Mod assist         General transfer comment: Pt performing 4 sit to stand trial. PT providing verbal cues for increased forward trunk lean and rocking forward to gain momentum  Ambulation/Gait Ambulation/Gait assistance: Min assist Gait Distance (Feet): 3 Feet Assistive device: Rolling walker (2 wheeled) Gait Pattern/deviations: Step-to pattern;Shuffle Gait velocity: reduced Gait velocity interpretation: <1.31 ft/sec, indicative of  household ambulator General Gait Details: gait was really steps to get to chair   Stairs             Wheelchair Mobility    Modified Rankin (Stroke Patients Only)       Balance Overall balance assessment: Needs assistance Sitting-balance support: Bilateral upper extremity supported;Feet supported Sitting balance-Leahy Scale: Fair     Standing balance support: Bilateral upper extremity supported Standing balance-Leahy Scale: Poor                              Cognition Arousal/Alertness: Awake/alert Behavior During Therapy: WFL for tasks assessed/performed Overall Cognitive Status: Within Functional Limits for tasks assessed                                        Exercises General Exercises - Lower Extremity Ankle Circles/Pumps: AROM;20 reps;Both Quad Sets: 10 reps;Right Long Arc Quad: AROM;Left;10 reps    General Comments        Pertinent Vitals/Pain Pain Assessment: Faces Faces Pain Scale: Hurts even more Pain Location: R hip and leg Pain Descriptors / Indicators: Grimacing;Operative site guarding Pain Intervention(s): Limited activity within patient's tolerance    Home Living                      Prior Function            PT Goals (current goals can now be found in the care plan section) Acute Rehab PT Goals  Patient Stated Goal: to get home as soon as possible Progress towards PT goals: Progressing toward goals    Frequency    Min 5X/week      PT Plan Frequency needs to be updated    Co-evaluation              AM-PAC PT "6 Clicks" Mobility   Outcome Measure  Help needed turning from your back to your side while in a flat bed without using bedrails?: A Lot Help needed moving from lying on your back to sitting on the side of a flat bed without using bedrails?: A Lot Help needed moving to and from a bed to a chair (including a wheelchair)?: A Lot Help needed standing up from a chair using your  arms (e.g., wheelchair or bedside chair)?: A Lot Help needed to walk in hospital room?: A Lot Help needed climbing 3-5 steps with a railing? : A Lot 6 Click Score: 12    End of Session Equipment Utilized During Treatment: Gait belt Activity Tolerance: Patient tolerated treatment well Patient left: in chair;with call bell/phone within reach Nurse Communication: Mobility status PT Visit Diagnosis: Unsteadiness on feet (R26.81);Muscle weakness (generalized) (M62.81);History of falling (Z91.81)     Time: [id number] PT Time Calculation (min) (ACUTE ONLY): 35 min  Charges:  $Therapeutic Activity: 23-37 mins                     Zenaida Niece, PT, DPT Acute Rehabilitation Pager: [phone]    Zenaida Niece 03/17/2019, 11:28 AM

## 2019-03-17 NOTE — Progress Notes (Signed)
Inpatient Rehab Admissions:  Inpatient Rehab Consult received.  I met with patient and his wife at the bedside for rehabilitation assessment and to discuss goals and expectations of an inpatient rehab admission.  Both are hopeful for CIR admission.  Will f/u Monday to assess pain control and whether pt still meets medically necessity for inpatient rehab admit.   Signed: Shann Medal, PT, DPT Admissions Coordinator [phone] 03/17/19  12:29 PM

## 2019-03-17 NOTE — Progress Notes (Signed)
Inpatient Diabetes Program Recommendations  AACE/ADA: New Consensus Statement on Inpatient Glycemic Control (2015)  Target Ranges:  Prepandial:   less than 140 mg/dL      Peak postprandial:   less than 180 mg/dL (1-2 hours)      Critically ill patients:  140 - 180 mg/dL   Lab Results  Component Value Date   GLUCAP 264 (H) 03/17/2019   HGBA1C 6.4 (H) 03/10/2019    Review of Glycemic Control Results for CHEICK, VERT (MRN [id number]) as of 03/17/2019 14:32  Ref. Range 03/16/2019 16:23 03/16/2019 21:55 03/17/2019 06:29 03/17/2019 11:47  Glucose-Capillary Latest Ref Range: 70 - 99 mg/dL 305 (H) 267 (H) 220 (H) 264 (H)   Diabetes history: Type 2 DM Outpatient Diabetes medications: Januvia 100 mg QD, Glipizide 2.5 mg QAM, Metformin 1000 mg BID Current orders for Inpatient glycemic control: Lantus 35 units QD, Novolog 0-9 units TID  Inpatient Diabetes Program Recommendations:    If glucose continue to exceed 200's mg/dL, Consider increasing Lantus to 40 units QD.   Thanks, Bronson Curb, MSN, RNC-OB Diabetes Coordinator [phone] (8a-5p)

## 2019-03-17 NOTE — Progress Notes (Signed)
[place] for Coumadin Indication: atrial fibrillation  Allergies  Allergen Reactions  . Altace [Ramipril] Other (See Comments)    "throat felt like had a knot in it"  . Augmentin [Amoxicillin-Pot Clavulanate] Diarrhea    severe  . Codeine Nausea And Vomiting    Nausea and vomiting   . Simvastatin Other (See Comments)    Leg aches    Patient Measurements: Height: 5' 5.98" (167.6 cm) Weight: 204 lb (92.5 kg) IBW/kg (Calculated) : 63.76  Vital Signs: Temp: 97.7 F (36.5 C) (09/25 0757) Temp Source: Oral (09/25 0757) BP: 117/63 (09/25 0757) Pulse Rate: 62 (09/25 0757)  Labs: Recent Labs    03/15/19 0329 03/16/19 0244 03/17/19 0428  HGB 9.4* 8.9* 9.2*  HCT 28.6* 26.9* 27.8*  PLT 406* 404* 462*  LABPROT 14.9 15.5* 23.5*  INR 1.2 1.3* 2.1*  CREATININE 0.88 0.92 0.92    Estimated Creatinine Clearance: 63.7 mL/min (by C-G formula based on SCr of 0.92 mg/dL).   Assessment:  Anticoag:Coumadin PTA, INR 2.1 PTA Coumadin: 5 mg days except 7.5 mg on Tues and Sat, last dose 9/17 PTA Resumed Coumadin on 9/23 post surgery     Goal of Therapy:  INR 2-3 Monitor platelets by anticoagulation protocol: Yes   Plan:  - No Warfarin today - > DC Lovenox - Daily CBC and PT/INR   Thank you Anette Guarneri, PharmD [phone]  03/17/2019,8:02 AM

## 2019-03-17 NOTE — Progress Notes (Signed)
PROGRESS NOTE    Oscar Baker  [id number] DOB: 1932-08-20 DOA: 03/09/2019 PCP: [name], MD   Brief Narrative:  Patient is 63 male with history of atrial fibrillation, pacemaker placement for bradycardia, diastolic function, CHF, anemia, diabetes mellitus, prostate cancer who was brought from home after he fell.  Patient was found to have right hip periprosthetic fracture.  Orthopedics consulted and he underwent ORIF.  PT recommended CIR.  Assessment & Plan:   Principal Problem:   Closed right hip fracture, initial encounter ([place]) Active Problems:   PAF (paroxysmal atrial fibrillation) (HCC)   Diabetes mellitus type 2, noninsulin dependent (HCC)   Laryngopharyngeal reflux   CLL (chronic lymphocytic leukemia) (HCC)   Type 2 diabetes mellitus ([place])   Right-sided periprosthetic hip fracture: POD 2.  Status post open reduction internal fixation.  Continue DVT prophylaxis.  He is already on Coumadin.  PT/OT consulted.  PT recommended CIR.  CIR consulted.  Continue pain management.  Continue supportive care.  History of A. fib/status post pacemaker placement: Currently rate is controlled.  INR was subtherapeutic on presentation.  Started back on Coumadin.  Continue to monitor INR.  CLL: Currently in remission and stable.  Has elevated leukocytosis on baseline.  Hyperlipidemia: Continue statin  Diabetes type 2: Continue current insulin regimen.  Continue to monitor blood sugars   Nutrition Problem: Increased nutrient needs Etiology: post-op healing      DVT prophylaxis:Coumadin Code Status: DNR Family Communication: None present at the bedside Disposition Plan: CIR   Consultants: Orthopedics  Procedures: ORIF  Antimicrobials:  Anti-infectives (From admission, onward)   Start     Dose/Rate Route Frequency Ordered Stop   03/15/19 2000  ceFAZolin (ANCEF) IVPB 2g/100 mL premix     2 g 200 mL/hr over 30 Minutes Intravenous Every 6 hours 03/15/19 1644 03/16/19 0301    03/14/19 0600  ceFAZolin (ANCEF) IVPB 2g/100 mL premix  Status:  Discontinued     2 g 200 mL/hr over 30 Minutes Intravenous On call to O.R. 03/13/19 2327 03/14/19 1740      Subjective:  Patient seen and examined the bedside this morning.  Hemodynamically stable.  Complains of pain on the surgical site.  No new complaints otherwise.  Objective: Vitals:   03/17/19 0332 03/17/19 0757 03/17/19 0859 03/17/19 1253  BP: (!) 109/56 117/63 (!) 112/49 (!) 111/36  Pulse: 68 62 60 (!) 58  Resp: 18 19 16 18   Temp: 98.5 F (36.9 C) 97.7 F (36.5 C) 98.2 F (36.8 C) 98.6 F (37 C)  TempSrc: Oral Oral Oral Oral  SpO2: 94% 94% 94% 95%  Weight:      Height:        Intake/Output Summary (Last 24 hours) at 03/17/2019 1329 Last data filed at 03/17/2019 0900 Gross per 24 hour  Intake 907.66 ml  Output 3050 ml  Net -2142.34 ml   Filed Weights   03/09/19 2341 03/14/19 1525 03/15/19 1125  Weight: 92.5 kg 92.5 kg 92.5 kg    Examination:  General exam: Not in distress,average built, elderly gentleman, very hard of hearing HEENT:PERRL,Oral mucosa moist, Ear/Nose normal on gross exam Respiratory system: Bilateral equal air entry, normal vesicular breath sounds, no wheezes or crackles  Cardiovascular system: S1 & S2 heard, RRR. No JVD, murmurs, rubs, gallops or clicks. No pedal edema. Gastrointestinal system: Abdomen is nondistended, soft and nontender. No organomegaly or masses felt. Normal bowel sounds heard. Central nervous system: Alert and oriented. No focal neurological deficits. Extremities: No edema, no clubbing ,  no cyanosis, distal peripheral pulses palpable.  Clean surgical wound on the right hip Skin: No rashes, lesions or ulcers,no icterus ,no pallor MSK: Normal muscle bulk,tone ,power Psychiatry: Judgement and insight appear normal. Mood & affect appropriate.     Data Reviewed: I have personally reviewed following labs and imaging studies  CBC: Recent Labs  Lab 03/13/19 0511  03/14/19 0400 03/15/19 0329 03/16/19 0244 03/17/19 0428  WBC 37.5* 37.7* 35.6* 33.1* 37.2*  NEUTROABS 10.9* 9.5* 6.9 12.2* 7.4  HGB 10.0* 9.7* 9.4* 8.9* 9.2*  HCT 29.5* 29.3* 28.6* 26.9* 27.8*  MCV 97.7 99.0 97.9 99.3 99.3  PLT 263 353 406* 404* [id number]*   Basic Metabolic Panel: Recent Labs  Lab 03/13/19 0511 03/14/19 0400 03/15/19 0329 03/16/19 0244 03/17/19 0428  NA 133* 132* 131* 132* 132*  K 4.9 5.0 4.8 4.5 4.9  CL 100 98 98 98 97*  CO2 24 24 24 28 28   GLUCOSE 276* 311* 268* 173* 225*  BUN 24* 26* 33* 29* 20  CREATININE 0.96 0.92 0.88 0.92 0.92  CALCIUM 8.5* 8.3* 8.7* 8.3* 8.5*   GFR: Estimated Creatinine Clearance: 63.7 mL/min (by C-G formula based on SCr of 0.92 mg/dL). Liver Function Tests: No results for input(s): AST, ALT, ALKPHOS, BILITOT, PROT, ALBUMIN in the last 168 hours. No results for input(s): LIPASE, AMYLASE in the last 168 hours. No results for input(s): AMMONIA in the last 168 hours. Coagulation Profile: Recent Labs  Lab 03/13/19 0511 03/14/19 0400 03/15/19 0329 03/16/19 0244 03/17/19 0428  INR 1.5* 1.4* 1.2 1.3* 2.1*   Cardiac Enzymes: No results for input(s): CKTOTAL, CKMB, CKMBINDEX, TROPONINI in the last 168 hours. BNP (last 3 results) No results for input(s): PROBNP in the last 8760 hours. HbA1C: No results for input(s): HGBA1C in the last 72 hours. CBG: Recent Labs  Lab 03/16/19 1106 03/16/19 1623 03/16/19 2155 03/17/19 0629 03/17/19 1147  GLUCAP 239* 305* 267* 220* 264*   Lipid Profile: No results for input(s): CHOL, HDL, LDLCALC, TRIG, CHOLHDL, LDLDIRECT in the last 72 hours. Thyroid Function Tests: No results for input(s): TSH, T4TOTAL, FREET4, T3FREE, THYROIDAB in the last 72 hours. Anemia Panel: No results for input(s): VITAMINB12, FOLATE, FERRITIN, TIBC, IRON, RETICCTPCT in the last 72 hours. Sepsis Labs: No results for input(s): PROCALCITON, LATICACIDVEN in the last 168 hours.  Recent Results (from the past 240  hour(s))  SARS CORONAVIRUS 2 (TAT 6-24 HRS) Nasopharyngeal Nasopharyngeal Swab     Status: None   Collection Time: 03/10/19  1:53 AM   Specimen: Nasopharyngeal Swab  Result Value Ref Range Status   SARS Coronavirus 2 NEGATIVE NEGATIVE Final    Comment: (NOTE) SARS-CoV-2 target nucleic acids are NOT DETECTED. The SARS-CoV-2 RNA is generally detectable in upper and lower respiratory specimens during the acute phase of infection. Negative results do not preclude SARS-CoV-2 infection, do not rule out co-infections with other pathogens, and should not be used as the sole basis for treatment or other patient management decisions. Negative results must be combined with clinical observations, patient history, and epidemiological information. The expected result is Negative. Fact Sheet for Patients: SugarRoll.be Fact Sheet for Healthcare Providers: https://www.woods-mathews.com/ This test is not yet approved or cleared by the [place] FDA and  has been authorized for detection and/or diagnosis of SARS-CoV-2 by FDA under an Emergency Use Authorization (EUA). This EUA will remain  in effect (meaning this test can be used) for the duration of the COVID-19 declaration under Section 56 4(b)(1) of the Act, 21 U.S.C. section  360bbb-3(b)(1), unless the authorization is terminated or revoked sooner. Performed at [facility], [place] [address], [place], [place] [address]   Surgical pcr screen     Status: Abnormal   Collection Time: 03/10/19  3:39 PM   Specimen: Nasal Mucosa; Nasal Swab  Result Value Ref Range Status   MRSA, PCR NEGATIVE NEGATIVE Final   Staphylococcus aureus POSITIVE (A) NEGATIVE Final    Comment: (NOTE) The Xpert SA Assay (FDA approved for NASAL specimens in patients 77 years of age and older), is one component of a comprehensive surveillance program. It is not intended to diagnose infection nor to guide or monitor treatment.  Performed at [facility], [place] [address] [place], [place] [address]          Radiology Studies: Dg Hip Port Unilat With Pelvis 1v Right  Result Date: 03/15/2019 CLINICAL DATA:  Status post surgical repair of right hip fracture. EXAM: DG HIP (WITH OR WITHOUT PELVIS) 1V PORT RIGHT COMPARISON:  Radiographs June 09, 2019. FINDINGS: Patient is status post surgical fixation of periprosthetic fracture involving the proximal right femur. Status post right total hip arthroplasty. IMPRESSION: Status post surgical internal fixation of proximal right femoral periprosthetic fracture. Electronically Signed   By: Marijo Conception M.D.   On: 03/15/2019 16:15        Scheduled Meds: . atorvastatin  20 mg Oral q1800  . carvedilol  3.125 mg Oral BID WC  . Chlorhexidine Gluconate Cloth  6 each Topical Daily  . docusate sodium  100 mg Oral BID  . fenofibrate  160 mg Oral Daily  . ferrous sulfate  325 mg Oral TID PC  . gabapentin  300 mg Oral TID  . insulin aspart  0-9 Units Subcutaneous TID WC  . insulin glargine  35 Units Subcutaneous Daily  . multivitamin with minerals  1 tablet Oral Daily  . mupirocin ointment   Nasal BID  . vitamin B-12  1,000 mcg Oral Daily  . Warfarin - Pharmacist Dosing Inpatient   Does not apply q1800   Continuous Infusions: . lactated ringers Stopped (03/16/19 1523)  . lactated ringers Stopped (03/16/19 1523)  . methocarbamol (ROBAXIN) IV       LOS: 7 days    Time spent: 35 mins.More than 50% of that time was spent in counseling and/or coordination of care.      Shelly Coss, MD [facility] Pager [phone]  If 7PM-7AM, please contact night-coverage [email or web address] Password [email or web address] 03/17/2019, 1:29 PM

## 2019-03-17 NOTE — Plan of Care (Signed)
  Problem: Education: Goal: Knowledge of General Education information will improve Description: Including pain rating scale, medication(s)/side effects and non-pharmacologic comfort measures Outcome: Progressing   Problem: Clinical Measurements: Goal: Will remain free from infection Outcome: Progressing Goal: Respiratory complications will improve Outcome: Progressing Goal: Cardiovascular complication will be avoided Outcome: Progressing   Problem: Elimination: Goal: Will not experience complications related to bowel motility Outcome: Progressing Goal: Will not experience complications related to urinary retention Outcome: Progressing   Problem: Pain Managment: Goal: General experience of comfort will improve Outcome: Progressing   Problem: Safety: Goal: Ability to remain free from injury will improve Outcome: Progressing   Problem: Skin Integrity: Goal: Risk for impaired skin integrity will decrease Outcome: Progressing   Jenene Slicker, [place] 9/25 [phone]

## 2019-03-17 NOTE — Progress Notes (Signed)
Patient ID: Oscar Baker, male   DOB: [birth year], 85 y.o.   MRN: [id number] Subjective: 2 Days Post-Op Procedure(s) (LRB): OPEN REDUCTION INTERNAL FIXATION (ORIF) PERIPROSTHETIC FRACTURE WITH ZIMMER CABLES (Right)    Patient reports pain as moderate. Up with therapy a little yesterday.  No events other than feeling insecure and weak  Objective:   VITALS:   Vitals:   03/17/19 0043 03/17/19 0332  BP:  (!) 109/56  Pulse:  68  Resp: 16 18  Temp:  98.5 F (36.9 C)  SpO2:  94%    Neurovascular intact Incision: dressing C/D/I  LABS Recent Labs    03/15/19 0329 03/16/19 0244 03/17/19 0428  HGB 9.4* 8.9* 9.2*  HCT 28.6* 26.9* 27.8*  WBC 35.6* 33.1* 37.2*  PLT 406* 404* 462*    Recent Labs    03/15/19 0329 03/16/19 0244 03/17/19 0428  NA 131* 132* 132*  K 4.8 4.5 4.9  BUN 33* 29* 20  CREATININE 0.88 0.92 0.92  GLUCOSE 268* 173* 225*    Recent Labs    03/16/19 0244 03/17/19 0428  INR 1.3* 2.1*     Assessment/Plan: 2 Days Post-Op Procedure(s) (LRB): OPEN REDUCTION INTERNAL FIXATION (ORIF) PERIPROSTHETIC FRACTURE WITH ZIMMER CABLES (Right)   Up with therapy  PWB RLE D/C knee immobilizer - not needed Disposition discussion including possible CIR transfer  CLL - leukocytosis

## 2019-03-18 LAB — GLUCOSE, CAPILLARY
Glucose-Capillary: 131 mg/dL — ABNORMAL HIGH (ref 70–99)
Glucose-Capillary: 175 mg/dL — ABNORMAL HIGH (ref 70–99)
Glucose-Capillary: 227 mg/dL — ABNORMAL HIGH (ref 70–99)
Glucose-Capillary: 210 mg/dL — ABNORMAL HIGH (ref 70–99)
Glucose-Capillary: 256 mg/dL — ABNORMAL HIGH (ref 70–99)

## 2019-03-18 LAB — CBC WITH DIFFERENTIAL/PLATELET
Abs Immature Granulocytes: 0.69 10*3/uL — ABNORMAL HIGH (ref 0.00–0.07)
Basophils Absolute: 0.1 10*3/uL (ref 0.0–0.1)
Basophils Relative: 0 %
Eosinophils Absolute: 0.3 10*3/uL (ref 0.0–0.5)
Eosinophils Relative: 1 %
HCT: 27 % — ABNORMAL LOW (ref 39.0–52.0)
Hemoglobin: 9 g/dL — ABNORMAL LOW (ref 13.0–17.0)
Immature Granulocytes: 2 %
Lymphocytes Relative: 67 %
Lymphs Abs: 29.6 10*3/uL — ABNORMAL HIGH (ref 0.7–4.0)
MCH: 33.2 pg (ref 26.0–34.0)
MCHC: 33.3 g/dL (ref 30.0–36.0)
MCV: 99.6 fL (ref 80.0–100.0)
Monocytes Absolute: 5 10*3/uL — ABNORMAL HIGH (ref 0.1–1.0)
Monocytes Relative: 12 %
Neutro Abs: 7.6 10*3/uL (ref 1.7–7.7)
Neutrophils Relative %: 18 %
Platelets: 463 10*3/uL — ABNORMAL HIGH (ref 150–400)
RBC: 2.71 MIL/uL — ABNORMAL LOW (ref 4.22–5.81)
RDW: 18.9 % — ABNORMAL HIGH (ref 11.5–15.5)
WBC: 43.4 10*3/uL — ABNORMAL HIGH (ref 4.0–10.5)
nRBC: 0.2 % (ref 0.0–0.2)

## 2019-03-18 LAB — PROTIME-INR
INR: 1.9 — ABNORMAL HIGH (ref 0.8–1.2)
Prothrombin Time: 21.4 seconds — ABNORMAL HIGH (ref 11.4–15.2)

## 2019-03-18 MED ORDER — ACETAMINOPHEN 325 MG PO TABS
650.0000 mg | ORAL_TABLET | Freq: Four times a day (QID) | ORAL | Status: DC | PRN
  Administered 2019-03-18: 650 mg via ORAL
  Filled 2019-03-18: qty 2

## 2019-03-18 MED ORDER — IPRATROPIUM-ALBUTEROL 0.5-2.5 (3) MG/3ML IN SOLN
3.0000 mL | Freq: Four times a day (QID) | RESPIRATORY_TRACT | Status: DC
  Administered 2019-03-18 (×3): 3 mL via RESPIRATORY_TRACT
  Filled 2019-03-18 (×2): qty 3

## 2019-03-18 MED ORDER — WARFARIN SODIUM 5 MG PO TABS
5.0000 mg | ORAL_TABLET | Freq: Once | ORAL | Status: AC
  Administered 2019-03-18: 5 mg via ORAL
  Filled 2019-03-18: qty 1

## 2019-03-18 MED ORDER — IPRATROPIUM-ALBUTEROL 0.5-2.5 (3) MG/3ML IN SOLN
3.0000 mL | Freq: Three times a day (TID) | RESPIRATORY_TRACT | Status: DC
  Administered 2019-03-19 – 2019-03-20 (×4): 3 mL via RESPIRATORY_TRACT
  Filled 2019-03-18 (×5): qty 3

## 2019-03-18 NOTE — Evaluation (Signed)
Occupational Therapy Evaluation Patient Details Name: Oscar Baker MRN: [id number] DOB: [birth year] Today's Date: 03/18/2019    History of Present Illness 85 yo male with onset of trip at home resulted in fall and fracture of R femur adjacent to prosthetic components of hip.  MD performed ORIF on 9/23, MD upgraded to [facility] on RLE.  PMHx:  R BBB, shingles, HTN, pacemaker, CHF, DM, prostate and bladder CA, MI, PAF, CLL   Clinical Impression   Pt admitted with see above. Pt completed bed mobility with HOB elevated and grab bars with moderate assist. Pt transferred from elevated surface with max cues on hand placement to follow precautions and mod to max  +2. Pt ambulated with RW and max cues on sequence to take about 3-4 steps to chair with moderate assist to guide RW.  Pt currently with functional limitations due to the deficits listed below (see OT Problem List).  Pt will benefit from skilled OT to increase their safety and independence with ADL and functional mobility for ADL to facilitate discharge to venue listed below.      Follow Up Recommendations  CIR;Supervision/Assistance - 24 hour    Equipment Recommendations       Recommendations for Other Services       Precautions / Restrictions Precautions Precautions: Fall Precaution Comments: premedicate Restrictions Weight Bearing Restrictions: Yes RLE Weight Bearing: Partial weight bearing RLE Partial Weight Bearing Percentage or Pounds: 50 Other Position/Activity Restrictions: TDWB but should be NWB      Mobility Bed Mobility Overal bed mobility: Needs Assistance Bed Mobility: Supine to Sit Rolling: Mod assist            Transfers Overall transfer level: Needs assistance Equipment used: Rolling walker (2 wheeled) Transfers: Sit to/from Stand Sit to Stand: Mod assist;+2 physical assistance;+2 safety/equipment;From elevated surface              Balance Overall balance assessment: Needs assistance Sitting-balance  support: Bilateral upper extremity supported;Feet supported Sitting balance-Leahy Scale: Fair     Standing balance support: Bilateral upper extremity supported Standing balance-Leahy Scale: Poor                             ADL either performed or assessed with clinical judgement   ADL Overall ADL's : Needs assistance/impaired Eating/Feeding: Set up;Sitting   Grooming: Wash/dry hands;Wash/dry face;Sitting;Set up   Upper Body Bathing: Minimal assistance;Sitting;Cueing for safety;Cueing for sequencing   Lower Body Bathing: Maximal assistance;+2 for physical assistance;+2 for safety/equipment;Cueing for safety;Cueing for sequencing;Sit to/from stand   Upper Body Dressing : Minimal assistance;Cueing for safety;Cueing for sequencing;Sitting   Lower Body Dressing: Maximal assistance;+2 for physical assistance;+2 for safety/equipment;Cueing for safety;Cueing for sequencing;Sit to/from stand   Toilet Transfer: Maximal assistance;+2 for physical assistance;+2 for safety/equipment   Toileting- Clothing Manipulation and Hygiene: Maximal assistance;+2 for physical assistance;+2 for safety/equipment;Cueing for safety;Cueing for sequencing;Sit to/from stand   Tub/ Banker: Maximal assistance;+2 for physical assistance;+2 for safety/equipment;Rolling walker   Functional mobility during ADLs: Maximal assistance;Rolling walker;Cueing for safety;Cueing for sequencing General ADL Comments: cues on sequencing and WB status     Vision Baseline Vision/History: Wears glasses Wears Glasses: Reading only Patient Visual Report: No change from baseline Vision Assessment?: No apparent visual deficits     Perception Perception Perception Tested?: No   Praxis Praxis Praxis tested?: Not tested    Pertinent Vitals/Pain Pain Assessment: 0-10 Pain Score: 7  Pain Descriptors / Indicators: Grimacing;Operative site guarding Pain  Intervention(s): Limited activity within patient's  tolerance;Monitored during session;Repositioned     Hand Dominance Right   Extremity/Trunk Assessment Upper Extremity Assessment Upper Extremity Assessment: Generalized weakness LUE Deficits / Details: shoulder weakness   Lower Extremity Assessment Lower Extremity Assessment: Defer to PT evaluation RLE Deficits / Details: new ORIF RLE: Unable to fully assess due to pain;Unable to fully assess due to immobilization RLE Sensation: decreased proprioception RLE Coordination: decreased fine motor;decreased gross motor   Cervical / Trunk Assessment Cervical / Trunk Assessment: Kyphotic   Communication Communication Communication: HOH   Cognition Arousal/Alertness: Awake/alert Behavior During Therapy: WFL for tasks assessed/performed Overall Cognitive Status: Within Functional Limits for tasks assessed                                     General Comments  decrease skin integrity on back for gait belt placement    Exercises     Shoulder Instructions      Home Living Family/patient expects to be discharged to:: Inpatient rehab Living Arrangements: Spouse/significant other Available Help at Discharge: Family;Available 24 hours/day Type of Home: Apartment Home Access: Level entry     Home Layout: One level     Bathroom Shower/Tub: Occupational psychologist: Handicapped height Bathroom Accessibility: No   Home Equipment: Grab bars - toilet;Grab bars - tub/shower;Walker - 2 wheels   Additional Comments: Pt is demonstrating poor control of RLE despite previous level of I      Prior Functioning/Environment Level of Independence: Independent with assistive device(s)        Comments: RW at home with pt being able to go out in community prev        OT Problem List: Decreased strength;Decreased activity tolerance;Impaired balance (sitting and/or standing);Decreased safety awareness;Pain;Decreased knowledge of use of DME or AE;Decreased knowledge  of precautions      OT Treatment/Interventions: Self-care/ADL training;Therapeutic exercise;DME and/or AE instruction;Patient/family education;Balance training    OT Goals(Current goals can be found in the care plan section) Acute Rehab OT Goals Patient Stated Goal: to continue therapy OT Goal Formulation: With patient Time For Goal Achievement: 03/25/19 Potential to Achieve Goals: Good ADL Goals Pt Will Perform Lower Body Dressing: with mod assist;sit to/from stand Pt Will Transfer to Toilet: with mod assist;bedside commode Pt Will Perform Toileting - Clothing Manipulation and hygiene: with mod assist;sit to/from stand  OT Frequency: Min 2X/week   Barriers to D/C: Decreased caregiver support          Co-evaluation              AM-PAC OT "6 Clicks" Daily Activity     Outcome Measure Help from another person eating meals?: A Little Help from another person taking care of personal grooming?: A Little Help from another person toileting, which includes using toliet, bedpan, or urinal?: A Lot Help from another person bathing (including washing, rinsing, drying)?: A Lot Help from another person to put on and taking off regular upper body clothing?: A Little Help from another person to put on and taking off regular lower body clothing?: A Lot 6 Click Score: 15   End of Session Equipment Utilized During Treatment: Gait belt;Rolling walker Nurse Communication: Mobility status  Activity Tolerance: Patient limited by fatigue;Patient limited by pain Patient left: in chair;with chair alarm set  OT Visit Diagnosis: Unsteadiness on feet (R26.81);Repeated falls (R29.6);Muscle weakness (generalized) (M62.81);Pain Pain - Right/Left: Right Pain - part of  body: Leg                Time: 1342-1420 OT Time Calculation (min): 38 min Charges:  OT General Charges $OT Visit: 1 Visit OT Evaluation $OT Eval Moderate Complexity: 1 Mod OT Treatments $Self Care/Home Management : 23-37  mins  Joeseph Amor OTR/L  Acute Rehab Services  [phone] office number [phone] pager number   Joeseph Amor 03/18/2019, 2:25 PM

## 2019-03-18 NOTE — Plan of Care (Signed)
°  Problem: Nutrition: °Goal: Adequate nutrition will be maintained °Outcome: Progressing °  °Problem: Coping: °Goal: Level of anxiety will decrease °Outcome: Progressing °  °Problem: Safety: °Goal: Ability to remain free from injury will improve °Outcome: Progressing °  °

## 2019-03-18 NOTE — Progress Notes (Signed)
PROGRESS NOTE    Oscar Baker  [id number] DOB: 1933-03-09 DOA: 03/09/2019 PCP: [name], MD   Brief Narrative:  Patient is 45 male with history of atrial fibrillation, pacemaker placement for bradycardia, diastolic function, CHF, anemia, diabetes mellitus, prostate cancer who was brought from home after he fell.  Patient was found to have right hip periprosthetic fracture.  Orthopedics consulted and he underwent ORIF.  PT recommended CIR.  Assessment & Plan:   Principal Problem:   Closed right hip fracture, initial encounter ([place]) Active Problems:   PAF (paroxysmal atrial fibrillation) (HCC)   Diabetes mellitus type 2, noninsulin dependent (HCC)   Laryngopharyngeal reflux   CLL (chronic lymphocytic leukemia) (HCC)   Type 2 diabetes mellitus ([place])   Right-sided periprosthetic hip fracture: POD 3.  Status post open reduction internal fixation.  Continue DVT prophylaxis.  He is already on Coumadin.  PT/OT consulted.  PT recommended CIR.  CIR consulted.  Continue pain management.  Continue supportive care.  History of A. fib/status post pacemaker placement: Currently rate is controlled.  INR was subtherapeutic on presentation.  Started back on Coumadin.  Continue to monitor INR.  CLL: Currently in remission and stable.  Has elevated leukocytosis on baseline.  Hyperlipidemia: Continue statin  Diabetes type 2: Continue current insulin regimen.  Continue to monitor blood sugars  Wheezes: Ordered duoneb   Nutrition Problem: Increased nutrient needs Etiology: post-op healing      DVT prophylaxis:Coumadin Code Status: DNR Family Communication: None present at the bedside Disposition Plan: CIR   Consultants: Orthopedics  Procedures: ORIF  Antimicrobials:  Anti-infectives (From admission, onward)   Start     Dose/Rate Route Frequency Ordered Stop   03/15/19 2000  ceFAZolin (ANCEF) IVPB 2g/100 mL premix     2 g 200 mL/hr over 30 Minutes Intravenous Every 6 hours  03/15/19 1644 03/16/19 0301   03/14/19 0600  ceFAZolin (ANCEF) IVPB 2g/100 mL premix  Status:  Discontinued     2 g 200 mL/hr over 30 Minutes Intravenous On call to O.R. 03/13/19 2327 03/14/19 1740      Subjective:  Patient seen and examined at bedside this morning.  Hemodynamically stable.  Complains of back pain.  Pain on the surgical wound is well controlled.  Very hard of hearing.  Says that he has wax in his left ear.  Objective: Vitals:   03/18/19 0047 03/18/19 0446 03/18/19 0450 03/18/19 0700  BP:  127/78  125/73  Pulse:  (!) 42 67 68  Resp:  18  18  Temp:  98.2 F (36.8 C)  98.3 F (36.8 C)  TempSrc:  Oral  Oral  SpO2: 95% 93%  95%  Weight:      Height:        Intake/Output Summary (Last 24 hours) at 03/18/2019 1118 Last data filed at 03/18/2019 0900 Gross per 24 hour  Intake 600 ml  Output 1300 ml  Net -700 ml   Filed Weights   03/09/19 2341 03/14/19 1525 03/15/19 1125  Weight: 92.5 kg 92.5 kg 92.5 kg    Examination:  General exam: Not in distress,average built, elderly gentleman, very hard of hearing HEENT:PERRL,Oral mucosa moist, Ear/Nose normal on gross exam Respiratory system: Bilateral equal air entry, normal vesicular breath sounds, no wheezes or crackles  Cardiovascular system: S1 & S2 heard, RRR. No JVD, murmurs, rubs, gallops or clicks. No pedal edema. Gastrointestinal system: Abdomen is nondistended, soft and nontender. No organomegaly or masses felt. Normal bowel sounds heard. Central nervous system: Alert  and oriented. No focal neurological deficits. Extremities: No edema, no clubbing ,no cyanosis, distal peripheral pulses palpable.  Clean surgical wound on the right hip Skin: No rashes, lesions or ulcers,no icterus ,no pallor   Data Reviewed: I have personally reviewed following labs and imaging studies  CBC: Recent Labs  Lab 03/14/19 0400 03/15/19 0329 03/16/19 0244 03/17/19 0428 03/18/19 0344  WBC 37.7* 35.6* 33.1* 37.2* 43.4*   NEUTROABS 9.5* 6.9 12.2* 7.4 7.6  HGB 9.7* 9.4* 8.9* 9.2* 9.0*  HCT 29.3* 28.6* 26.9* 27.8* 27.0*  MCV 99.0 97.9 99.3 99.3 99.6  PLT 353 406* 404* 462* [id number]*   Basic Metabolic Panel: Recent Labs  Lab 03/13/19 0511 03/14/19 0400 03/15/19 0329 03/16/19 0244 03/17/19 0428  NA 133* 132* 131* 132* 132*  K 4.9 5.0 4.8 4.5 4.9  CL 100 98 98 98 97*  CO2 24 24 24 28 28   GLUCOSE 276* 311* 268* 173* 225*  BUN 24* 26* 33* 29* 20  CREATININE 0.96 0.92 0.88 0.92 0.92  CALCIUM 8.5* 8.3* 8.7* 8.3* 8.5*   GFR: Estimated Creatinine Clearance: 63.7 mL/min (by C-G formula based on SCr of 0.92 mg/dL). Liver Function Tests: No results for input(s): AST, ALT, ALKPHOS, BILITOT, PROT, ALBUMIN in the last 168 hours. No results for input(s): LIPASE, AMYLASE in the last 168 hours. No results for input(s): AMMONIA in the last 168 hours. Coagulation Profile: Recent Labs  Lab 03/14/19 0400 03/15/19 0329 03/16/19 0244 03/17/19 0428 03/18/19 0344  INR 1.4* 1.2 1.3* 2.1* 1.9*   Cardiac Enzymes: No results for input(s): CKTOTAL, CKMB, CKMBINDEX, TROPONINI in the last 168 hours. BNP (last 3 results) No results for input(s): PROBNP in the last 8760 hours. HbA1C: No results for input(s): HGBA1C in the last 72 hours. CBG: Recent Labs  Lab 03/17/19 0629 03/17/19 1147 03/17/19 1544 03/17/19 2050 03/18/19 0641  GLUCAP 220* 264* 192* 137* 131*   Lipid Profile: No results for input(s): CHOL, HDL, LDLCALC, TRIG, CHOLHDL, LDLDIRECT in the last 72 hours. Thyroid Function Tests: No results for input(s): TSH, T4TOTAL, FREET4, T3FREE, THYROIDAB in the last 72 hours. Anemia Panel: No results for input(s): VITAMINB12, FOLATE, FERRITIN, TIBC, IRON, RETICCTPCT in the last 72 hours. Sepsis Labs: No results for input(s): PROCALCITON, LATICACIDVEN in the last 168 hours.  Recent Results (from the past 240 hour(s))  SARS CORONAVIRUS 2 (TAT 6-24 HRS) Nasopharyngeal Nasopharyngeal Swab     Status: None    Collection Time: 03/10/19  1:53 AM   Specimen: Nasopharyngeal Swab  Result Value Ref Range Status   SARS Coronavirus 2 NEGATIVE NEGATIVE Final    Comment: (NOTE) SARS-CoV-2 target nucleic acids are NOT DETECTED. The SARS-CoV-2 RNA is generally detectable in upper and lower respiratory specimens during the acute phase of infection. Negative results do not preclude SARS-CoV-2 infection, do not rule out co-infections with other pathogens, and should not be used as the sole basis for treatment or other patient management decisions. Negative results must be combined with clinical observations, patient history, and epidemiological information. The expected result is Negative. Fact Sheet for Patients: SugarRoll.be Fact Sheet for Healthcare Providers: https://www.woods-mathews.com/ This test is not yet approved or cleared by the [place] FDA and  has been authorized for detection and/or diagnosis of SARS-CoV-2 by FDA under an Emergency Use Authorization (EUA). This EUA will remain  in effect (meaning this test can be used) for the duration of the COVID-19 declaration under Section 56 4(b)(1) of the Act, 21 U.S.C. section 360bbb-3(b)(1), unless the authorization is terminated  or revoked sooner. Performed at [facility], [place] [address] [place], [place] [address]   Surgical pcr screen     Status: Abnormal   Collection Time: 03/10/19  3:39 PM   Specimen: Nasal Mucosa; Nasal Swab  Result Value Ref Range Status   MRSA, PCR NEGATIVE NEGATIVE Final   Staphylococcus aureus POSITIVE (A) NEGATIVE Final    Comment: (NOTE) The Xpert SA Assay (FDA approved for NASAL specimens in patients 67 years of age and older), is one component of a comprehensive surveillance program. It is not intended to diagnose infection nor to guide or monitor treatment. Performed at [facility], [place] [address], [place],  [address]           Radiology Studies: No results found.      Scheduled Meds: . atorvastatin  20 mg Oral q1800  . carvedilol  3.125 mg Oral BID WC  . Chlorhexidine Gluconate Cloth  6 each Topical Daily  . docusate sodium  100 mg Oral BID  . fenofibrate  160 mg Oral Daily  . ferrous sulfate  325 mg Oral TID PC  . gabapentin  300 mg Oral TID  . insulin aspart  0-9 Units Subcutaneous TID WC  . insulin glargine  35 Units Subcutaneous Daily  . ipratropium-albuterol  3 mL Nebulization Q6H  . multivitamin with minerals  1 tablet Oral Daily  . mupirocin ointment   Nasal BID  . vitamin B-12  1,000 mcg Oral Daily  . warfarin  5 mg Oral ONCE-1800  . Warfarin - Pharmacist Dosing Inpatient   Does not apply q1800   Continuous Infusions: . lactated ringers Stopped (03/16/19 1523)  . lactated ringers Stopped (03/16/19 1523)  . methocarbamol (ROBAXIN) IV       LOS: 8 days    Time spent: 35 mins.More than 50% of that time was spent in counseling and/or coordination of care.      Shelly Coss, MD [facility] Pager [phone]  If 7PM-7AM, please contact night-coverage [email or web address] Password [email or web address] 03/18/2019, 11:18 AM

## 2019-03-18 NOTE — Progress Notes (Addendum)
Oscar Baker for Coumadin Indication: atrial fibrillation  Allergies  Allergen Reactions  . Altace [Ramipril] Other (See Comments)    "throat felt like had a knot in it"  . Augmentin [Amoxicillin-Pot Clavulanate] Diarrhea    severe  . Codeine Nausea And Vomiting    Nausea and vomiting   . Simvastatin Other (See Comments)    Leg aches    Patient Measurements: Height: 5' 5.98" (167.6 cm) Weight: 204 lb (92.5 kg) IBW/kg (Calculated) : 63.76  Vital Signs: Temp: 98.2 F (36.8 C) (09/26 0446) Temp Source: Oral (09/26 0446) BP: 127/78 (09/26 0446) Pulse Rate: 67 (09/26 0450)  Labs: Recent Labs    03/16/19 0244 03/17/19 0428 03/18/19 0344  HGB 8.9* 9.2* 9.0*  HCT 26.9* 27.8* 27.0*  PLT 404* 462* 463*  LABPROT 15.5* 23.5* 21.4*  INR 1.3* 2.1* 1.9*  CREATININE 0.92 0.92  --     Estimated Creatinine Clearance: 63.7 mL/min (by C-G formula based on SCr of 0.92 mg/dL).   Assessment:  Anticoag:Patient on Warfarin for atrial fibrillation. PTA Warfarin dosing: 5 mg days except 7.5 mg on Tues and Sat, last dose 9/17 PTA. Resumed Warfarin on 9/23 post surgery  Patients INR 2.1>1.9, cbc low but stable with hgb 9, hct 27 and platelets 463.    Goal of Therapy:  INR 2-3 Monitor platelets by anticoagulation protocol: Yes   Plan:  - Warfarin 5 mg today x1 - Daily CBC and PT/INR   Thank you for the interesting consult and for involving pharmacy in this patient's care.  Tamela Gammon, PharmD 03/18/2019 7:41 AM PGY-2 Pharmacy Administration Resident Direct Phone: [phone] Please check [email or web address] for unit-specific pharmacist phone numbers

## 2019-03-19 LAB — PROTIME-INR
INR: 2 — ABNORMAL HIGH (ref 0.8–1.2)
Prothrombin Time: 22.2 seconds — ABNORMAL HIGH (ref 11.4–15.2)

## 2019-03-19 LAB — CBC
HCT: 25.6 % — ABNORMAL LOW (ref 39.0–52.0)
Hemoglobin: 8.5 g/dL — ABNORMAL LOW (ref 13.0–17.0)
MCH: 33.1 pg (ref 26.0–34.0)
MCHC: 33.2 g/dL (ref 30.0–36.0)
MCV: 99.6 fL (ref 80.0–100.0)
Platelets: 464 10*3/uL — ABNORMAL HIGH (ref 150–400)
RBC: 2.57 MIL/uL — ABNORMAL LOW (ref 4.22–5.81)
RDW: 19.4 % — ABNORMAL HIGH (ref 11.5–15.5)
WBC: 41.1 10*3/uL — ABNORMAL HIGH (ref 4.0–10.5)
nRBC: 0.2 % (ref 0.0–0.2)

## 2019-03-19 LAB — GLUCOSE, CAPILLARY
Glucose-Capillary: 182 mg/dL — ABNORMAL HIGH (ref 70–99)
Glucose-Capillary: 223 mg/dL — ABNORMAL HIGH (ref 70–99)
Glucose-Capillary: 158 mg/dL — ABNORMAL HIGH (ref 70–99)
Glucose-Capillary: 159 mg/dL — ABNORMAL HIGH (ref 70–99)

## 2019-03-19 MED ORDER — INSULIN GLARGINE 100 UNIT/ML ~~LOC~~ SOLN
40.0000 [IU] | Freq: Every day | SUBCUTANEOUS | Status: DC
  Administered 2019-03-20: 40 [IU] via SUBCUTANEOUS
  Filled 2019-03-19: qty 0.4

## 2019-03-19 MED ORDER — WARFARIN SODIUM 5 MG PO TABS
5.0000 mg | ORAL_TABLET | Freq: Once | ORAL | Status: AC
  Administered 2019-03-19: 17:00:00 5 mg via ORAL
  Filled 2019-03-19: qty 1

## 2019-03-19 MED ORDER — SENNOSIDES-DOCUSATE SODIUM 8.6-50 MG PO TABS
2.0000 | ORAL_TABLET | Freq: Two times a day (BID) | ORAL | Status: DC
  Administered 2019-03-19 – 2019-03-20 (×2): 2 via ORAL
  Filled 2019-03-19 (×2): qty 2

## 2019-03-19 MED ORDER — HYDROCODONE-ACETAMINOPHEN 7.5-325 MG PO TABS
1.0000 | ORAL_TABLET | Freq: Four times a day (QID) | ORAL | Status: DC | PRN
  Administered 2019-03-19 (×2): 1 via ORAL
  Filled 2019-03-19 (×2): qty 1

## 2019-03-19 MED ORDER — FLEET ENEMA 7-19 GM/118ML RE ENEM: 1.0000 | ENEMA | Freq: Once | RECTAL | Status: DC

## 2019-03-19 MED ORDER — POLYETHYLENE GLYCOL 3350 17 G PO PACK
17.0000 g | PACK | Freq: Every day | ORAL | Status: DC
  Administered 2019-03-19 – 2019-03-20 (×2): 17 g via ORAL
  Filled 2019-03-19 (×2): qty 1

## 2019-03-19 NOTE — Progress Notes (Signed)
[place] for Coumadin Indication: atrial fibrillation  Allergies  Allergen Reactions  . Altace [Ramipril] Other (See Comments)    "throat felt like had a knot in it"  . Augmentin [Amoxicillin-Pot Clavulanate] Diarrhea    severe  . Codeine Nausea And Vomiting    Nausea and vomiting   . Simvastatin Other (See Comments)    Leg aches    Patient Measurements: Height: 5' 5.98" (167.6 cm) Weight: 204 lb (92.5 kg) IBW/kg (Calculated) : 63.76  Vital Signs:    Labs: Recent Labs    03/17/19 0428 03/18/19 0344 03/19/19 0338  HGB 9.2* 9.0* 8.5*  HCT 27.8* 27.0* 25.6*  PLT 462* 463* 464*  LABPROT 23.5* 21.4* 22.2*  INR 2.1* 1.9* 2.0*  CREATININE 0.92  --   --     Estimated Creatinine Clearance: 63.7 mL/min (by C-G formula based on SCr of 0.92 mg/dL).   Assessment:  Anticoag:Patient on Warfarin for atrial fibrillation. PTA Warfarin dosing: 5 mg days except 7.5 mg on Tues and Sat, last dose 9/17 PTA. Resumed Warfarin on 9/23 post surgery   INR therapeutic at 2.0 today. Hgb dropped to 8.5, Plt stable at 464. No bleeding noted.  Goal of Therapy:  INR 2-3 Monitor platelets by anticoagulation protocol: Yes   Plan:  - Warfarin 5 mg today x1 - Daily CBC and PT/INR   Thank you for the interesting consult and for involving pharmacy in this patient's care.  Berenice Bouton, PharmD PGY1 Pharmacy Resident Office phone: [phone] Phone until 3:30 pm: [phone] 03/19/2019 7:48 AM

## 2019-03-19 NOTE — Progress Notes (Signed)
PROGRESS NOTE    Oscar Baker  [id number] DOB: 1933-02-26 DOA: 03/09/2019 PCP: [name], MD   Brief Narrative:  Patient is 85 male with history of atrial fibrillation, pacemaker placement for bradycardia, diastolic function, CHF, anemia, diabetes mellitus, prostate cancer who was brought from home after he fell.  Patient was found to have right hip periprosthetic fracture.  Orthopedics consulted and he underwent ORIF.  PT recommended CIR. Patient is hemodynamically stable for discharge to CIR/SNF as soon as the bed is available.  Assessment & Plan:   Principal Problem:   Closed right hip fracture, initial encounter ([place]) Active Problems:   PAF (paroxysmal atrial fibrillation) (HCC)   Diabetes mellitus type 2, noninsulin dependent (HCC)   Laryngopharyngeal reflux   CLL (chronic lymphocytic leukemia) (HCC)   Type 2 diabetes mellitus ([place])   Right-sided periprosthetic hip fracture: POD 4.  Status post open reduction internal fixation.  Continue DVT prophylaxis.  He is already on Coumadin.  PT/OT consulted.  PT recommended CIR. Case manager Brewing technologist. CIR consulted.  Continue pain management.  Continue supportive care.  History of A. fib/status post pacemaker placement: Currently rate is controlled.  INR was subtherapeutic on presentation.  Started back on Coumadin.  Continue to monitor INR.  CLL: Currently in remission and stable.  Has elevated leukocytosis on baseline.  Hyperlipidemia: Continue statin  Diabetes type 2: Continue current insulin regimen.  Continue to monitor blood sugars  Constipation: Complains of constipation today.  Ordered bowel regimen   Nutrition Problem: Increased nutrient needs Etiology: post-op healing      DVT prophylaxis:Coumadin Code Status: DNR Family Communication: Call wife on phone and updated the plan Disposition Plan: CIR/SNF as soon as possible   Consultants: Orthopedics  Procedures: ORIF  Antimicrobials:   Anti-infectives (From admission, onward)   Start     Dose/Rate Route Frequency Ordered Stop   03/15/19 2000  ceFAZolin (ANCEF) IVPB 2g/100 mL premix     2 g 200 mL/hr over 30 Minutes Intravenous Every 6 hours 03/15/19 1644 03/16/19 0301   03/14/19 0600  ceFAZolin (ANCEF) IVPB 2g/100 mL premix  Status:  Discontinued     2 g 200 mL/hr over 30 Minutes Intravenous On call to O.R. 03/13/19 2327 03/14/19 1740      Subjective:  Patient seen and examined the bedside this morning.  Hemodynamically stable.  Respiratory status is much better today.  No wheezing heard.  Complains of severe constipation   Objective: Vitals:   03/18/19 1939 03/18/19 2008 03/19/19 0833 03/19/19 0900  BP: (!) 116/51   (!) 101/47  Pulse: 60   66  Resp: 19   18  Temp: 98.3 F (36.8 C)   98.1 F (36.7 C)  TempSrc: Oral   Oral  SpO2: 92% 97% 96% 95%  Weight:      Height:        Intake/Output Summary (Last 24 hours) at 03/19/2019 1150 Last data filed at 03/19/2019 0900 Gross per 24 hour  Intake 600 ml  Output 800 ml  Net -200 ml   Filed Weights   03/09/19 2341 03/14/19 1525 03/15/19 1125  Weight: 92.5 kg 92.5 kg 92.5 kg    Examination:  General exam: Not in distress,average built, elderly gentleman, very hard of hearing HEENT:PERRL,Oral mucosa moist, Ear/Nose normal on gross exam Respiratory system: Bilateral equal air entry, normal vesicular breath sounds, no wheezes or crackles  Cardiovascular system: S1 & S2 heard, RRR. No JVD, murmurs, rubs, gallops or clicks. No pedal edema. Gastrointestinal  system: Abdomen is nondistended, soft and nontender. No organomegaly or masses felt. Normal bowel sounds heard. Central nervous system: Alert and oriented. No focal neurological deficits. Extremities: No edema, no clubbing ,no cyanosis, distal peripheral pulses palpable.  Clean surgical wound on the right hip Skin: No rashes, lesions or ulcers,no icterus ,no pallor    Data Reviewed: I have personally  reviewed following labs and imaging studies  CBC: Recent Labs  Lab 03/14/19 0400 03/15/19 0329 03/16/19 0244 03/17/19 0428 03/18/19 0344 03/19/19 0338  WBC 37.7* 35.6* 33.1* 37.2* 43.4* 41.1*  NEUTROABS 9.5* 6.9 12.2* 7.4 7.6  --   HGB 9.7* 9.4* 8.9* 9.2* 9.0* 8.5*  HCT 29.3* 28.6* 26.9* 27.8* 27.0* 25.6*  MCV 99.0 97.9 99.3 99.3 99.6 99.6  PLT 353 406* 404* 462* 463* [id number]*   Basic Metabolic Panel: Recent Labs  Lab 03/13/19 0511 03/14/19 0400 03/15/19 0329 03/16/19 0244 03/17/19 0428  NA 133* 132* 131* 132* 132*  K 4.9 5.0 4.8 4.5 4.9  CL 100 98 98 98 97*  CO2 24 24 24 28 28   GLUCOSE 276* 311* 268* 173* 225*  BUN 24* 26* 33* 29* 20  CREATININE 0.96 0.92 0.88 0.92 0.92  CALCIUM 8.5* 8.3* 8.7* 8.3* 8.5*   GFR: Estimated Creatinine Clearance: 63.7 mL/min (by C-G formula based on SCr of 0.92 mg/dL). Liver Function Tests: No results for input(s): AST, ALT, ALKPHOS, BILITOT, PROT, ALBUMIN in the last 168 hours. No results for input(s): LIPASE, AMYLASE in the last 168 hours. No results for input(s): AMMONIA in the last 168 hours. Coagulation Profile: Recent Labs  Lab 03/15/19 0329 03/16/19 0244 03/17/19 0428 03/18/19 0344 03/19/19 0338  INR 1.2 1.3* 2.1* 1.9* 2.0*   Cardiac Enzymes: No results for input(s): CKTOTAL, CKMB, CKMBINDEX, TROPONINI in the last 168 hours. BNP (last 3 results) No results for input(s): PROBNP in the last 8760 hours. HbA1C: No results for input(s): HGBA1C in the last 72 hours. CBG: Recent Labs  Lab 03/18/19 1204 03/18/19 1606 03/18/19 1800 03/18/19 2208 03/19/19 0659  GLUCAP 175* 227* 210* 256* 182*   Lipid Profile: No results for input(s): CHOL, HDL, LDLCALC, TRIG, CHOLHDL, LDLDIRECT in the last 72 hours. Thyroid Function Tests: No results for input(s): TSH, T4TOTAL, FREET4, T3FREE, THYROIDAB in the last 72 hours. Anemia Panel: No results for input(s): VITAMINB12, FOLATE, FERRITIN, TIBC, IRON, RETICCTPCT in the last 72 hours.  Sepsis Labs: No results for input(s): PROCALCITON, LATICACIDVEN in the last 168 hours.  Recent Results (from the past 240 hour(s))  SARS CORONAVIRUS 2 (TAT 6-24 HRS) Nasopharyngeal Nasopharyngeal Swab     Status: None   Collection Time: 03/10/19  1:53 AM   Specimen: Nasopharyngeal Swab  Result Value Ref Range Status   SARS Coronavirus 2 NEGATIVE NEGATIVE Final    Comment: (NOTE) SARS-CoV-2 target nucleic acids are NOT DETECTED. The SARS-CoV-2 RNA is generally detectable in upper and lower respiratory specimens during the acute phase of infection. Negative results do not preclude SARS-CoV-2 infection, do not rule out co-infections with other pathogens, and should not be used as the sole basis for treatment or other patient management decisions. Negative results must be combined with clinical observations, patient history, and epidemiological information. The expected result is Negative. Fact Sheet for Patients: SugarRoll.be Fact Sheet for Healthcare Providers: https://www.woods-mathews.com/ This test is not yet approved or cleared by the [place] FDA and  has been authorized for detection and/or diagnosis of SARS-CoV-2 by FDA under an Emergency Use Authorization (EUA). This EUA will remain  in effect (meaning this test can be used) for the duration of the COVID-19 declaration under Section 56 4(b)(1) of the Act, 21 U.S.C. section 360bbb-3(b)(1), unless the authorization is terminated or revoked sooner. Performed at [facility], [place] [address], , [place] [address]   Surgical pcr screen     Status: Abnormal   Collection Time: 03/10/19  3:39 PM   Specimen: Nasal Mucosa; Nasal Swab  Result Value Ref Range Status   MRSA, PCR NEGATIVE NEGATIVE Final   Staphylococcus aureus POSITIVE (A) NEGATIVE Final    Comment: (NOTE) The Xpert SA Assay (FDA approved for NASAL specimens in patients 5 years of age and older), is one  component of a comprehensive surveillance program. It is not intended to diagnose infection nor to guide or monitor treatment. Performed at [facility], [place] [address], [place], [place] [address]          Radiology Studies: No results found.      Scheduled Meds: . atorvastatin  20 mg Oral q1800  . carvedilol  3.125 mg Oral BID WC  . Chlorhexidine Gluconate Cloth  6 each Topical Daily  . docusate sodium  100 mg Oral BID  . fenofibrate  160 mg Oral Daily  . ferrous sulfate  325 mg Oral TID PC  . gabapentin  300 mg Oral TID  . insulin aspart  0-9 Units Subcutaneous TID WC  . [START ON 03/20/2019] insulin glargine  40 Units Subcutaneous Daily  . ipratropium-albuterol  3 mL Nebulization TID  . multivitamin with minerals  1 tablet Oral Daily  . mupirocin ointment   Nasal BID  . polyethylene glycol  17 g Oral Daily  . senna-docusate  2 tablet Oral BID  . sodium phosphate  1 enema Rectal Once  . vitamin B-12  1,000 mcg Oral Daily  . warfarin  5 mg Oral ONCE-1800  . Warfarin - Pharmacist Dosing Inpatient   Does not apply q1800   Continuous Infusions: . lactated ringers Stopped (03/16/19 1523)  . lactated ringers Stopped (03/16/19 1523)  . methocarbamol (ROBAXIN) IV       LOS: 9 days    Time spent: 35 mins.More than 50% of that time was spent in counseling and/or coordination of care.      Shelly Coss, MD [facility] Pager [phone]  If 7PM-7AM, please contact night-coverage [email or web address] Password [facility] 03/19/2019, 11:50 AM

## 2019-03-19 NOTE — Plan of Care (Signed)
  Problem: Activity: Goal: Risk for activity intolerance will decrease Outcome: Progressing   Problem: Coping: Goal: Level of anxiety will decrease Outcome: Progressing   Problem: Pain Managment: Goal: General experience of comfort will improve Outcome: Progressing   Problem: Safety: Goal: Ability to remain free from injury will improve Outcome: Progressing   Problem: Skin Integrity: Goal: Risk for impaired skin integrity will decrease Outcome: Progressing   

## 2019-03-20 ENCOUNTER — Encounter (HOSPITAL_COMMUNITY): Payer: Self-pay | Admitting: Nurse Practitioner

## 2019-03-20 ENCOUNTER — Other Ambulatory Visit: Payer: Self-pay

## 2019-03-20 ENCOUNTER — Encounter (HOSPITAL_COMMUNITY): Payer: Self-pay

## 2019-03-20 ENCOUNTER — Inpatient Hospital Stay (HOSPITAL_COMMUNITY)
Admission: RE | Admit: 2019-03-20 | Discharge: 2019-04-11 | DRG: 560 | Disposition: A | Discharge status: Home Health | Payer: Medicare Other | Source: Intra-hospital | Attending: Physical Medicine and Rehabilitation | Admitting: Physical Medicine and Rehabilitation

## 2019-03-20 DIAGNOSIS — E119 Type 2 diabetes mellitus without complications: Secondary | ICD-10-CM

## 2019-03-20 DIAGNOSIS — Z87891 Personal history of nicotine dependence: Secondary | ICD-10-CM

## 2019-03-20 DIAGNOSIS — S20229D Contusion of unspecified back wall of thorax, subsequent encounter: Secondary | ICD-10-CM | POA: Diagnosis not present

## 2019-03-20 DIAGNOSIS — E11649 Type 2 diabetes mellitus with hypoglycemia without coma: Secondary | ICD-10-CM | POA: Diagnosis not present

## 2019-03-20 DIAGNOSIS — Z9181 History of falling: Secondary | ICD-10-CM

## 2019-03-20 DIAGNOSIS — I10 Essential (primary) hypertension: Secondary | ICD-10-CM | POA: Diagnosis not present

## 2019-03-20 DIAGNOSIS — I5022 Chronic systolic (congestive) heart failure: Secondary | ICD-10-CM | POA: Diagnosis present

## 2019-03-20 DIAGNOSIS — E875 Hyperkalemia: Secondary | ICD-10-CM

## 2019-03-20 DIAGNOSIS — Z23 Encounter for immunization: Secondary | ICD-10-CM | POA: Diagnosis not present

## 2019-03-20 DIAGNOSIS — Z96642 Presence of left artificial hip joint: Secondary | ICD-10-CM | POA: Diagnosis present

## 2019-03-20 DIAGNOSIS — I4892 Unspecified atrial flutter: Secondary | ICD-10-CM | POA: Diagnosis present

## 2019-03-20 DIAGNOSIS — R7309 Other abnormal glucose: Secondary | ICD-10-CM

## 2019-03-20 DIAGNOSIS — Z950 Presence of cardiac pacemaker: Secondary | ICD-10-CM

## 2019-03-20 DIAGNOSIS — I110 Hypertensive heart disease with heart failure: Secondary | ICD-10-CM | POA: Diagnosis present

## 2019-03-20 DIAGNOSIS — T402X5A Adverse effect of other opioids, initial encounter: Secondary | ICD-10-CM | POA: Diagnosis present

## 2019-03-20 DIAGNOSIS — J45991 Cough variant asthma: Secondary | ICD-10-CM | POA: Diagnosis not present

## 2019-03-20 DIAGNOSIS — Z7901 Long term (current) use of anticoagulants: Secondary | ICD-10-CM

## 2019-03-20 DIAGNOSIS — C9110 Chronic lymphocytic leukemia of B-cell type not having achieved remission: Secondary | ICD-10-CM | POA: Diagnosis present

## 2019-03-20 DIAGNOSIS — R059 Cough, unspecified: Secondary | ICD-10-CM

## 2019-03-20 DIAGNOSIS — Z9111 Patient's noncompliance with dietary regimen: Secondary | ICD-10-CM

## 2019-03-20 DIAGNOSIS — W1830XD Fall on same level, unspecified, subsequent encounter: Secondary | ICD-10-CM | POA: Diagnosis not present

## 2019-03-20 DIAGNOSIS — R600 Localized edema: Secondary | ICD-10-CM | POA: Diagnosis not present

## 2019-03-20 DIAGNOSIS — R296 Repeated falls: Secondary | ICD-10-CM | POA: Diagnosis present

## 2019-03-20 DIAGNOSIS — R001 Bradycardia, unspecified: Secondary | ICD-10-CM | POA: Diagnosis not present

## 2019-03-20 DIAGNOSIS — Z6833 Body mass index (BMI) 33.0-33.9, adult: Secondary | ICD-10-CM

## 2019-03-20 DIAGNOSIS — Z825 Family history of asthma and other chronic lower respiratory diseases: Secondary | ICD-10-CM

## 2019-03-20 DIAGNOSIS — E1142 Type 2 diabetes mellitus with diabetic polyneuropathy: Secondary | ICD-10-CM | POA: Diagnosis present

## 2019-03-20 DIAGNOSIS — E162 Hypoglycemia, unspecified: Secondary | ICD-10-CM | POA: Diagnosis not present

## 2019-03-20 DIAGNOSIS — S72002A Fracture of unspecified part of neck of left femur, initial encounter for closed fracture: Secondary | ICD-10-CM | POA: Diagnosis present

## 2019-03-20 DIAGNOSIS — K5903 Drug induced constipation: Secondary | ICD-10-CM

## 2019-03-20 DIAGNOSIS — I480 Paroxysmal atrial fibrillation: Secondary | ICD-10-CM | POA: Diagnosis present

## 2019-03-20 DIAGNOSIS — M9701XS Periprosthetic fracture around internal prosthetic right hip joint, sequela: Secondary | ICD-10-CM | POA: Diagnosis not present

## 2019-03-20 DIAGNOSIS — Z8551 Personal history of malignant neoplasm of bladder: Secondary | ICD-10-CM

## 2019-03-20 DIAGNOSIS — I255 Ischemic cardiomyopathy: Secondary | ICD-10-CM | POA: Diagnosis not present

## 2019-03-20 DIAGNOSIS — Z8249 Family history of ischemic heart disease and other diseases of the circulatory system: Secondary | ICD-10-CM | POA: Diagnosis not present

## 2019-03-20 DIAGNOSIS — M9701XD Periprosthetic fracture around internal prosthetic right hip joint, subsequent encounter: Principal | ICD-10-CM

## 2019-03-20 DIAGNOSIS — R05 Cough: Secondary | ICD-10-CM | POA: Diagnosis not present

## 2019-03-20 DIAGNOSIS — Y92009 Unspecified place in unspecified non-institutional (private) residence as the place of occurrence of the external cause: Secondary | ICD-10-CM

## 2019-03-20 DIAGNOSIS — E1165 Type 2 diabetes mellitus with hyperglycemia: Secondary | ICD-10-CM | POA: Diagnosis not present

## 2019-03-20 DIAGNOSIS — Z7984 Long term (current) use of oral hypoglycemic drugs: Secondary | ICD-10-CM | POA: Diagnosis not present

## 2019-03-20 DIAGNOSIS — E871 Hypo-osmolality and hyponatremia: Secondary | ICD-10-CM | POA: Diagnosis present

## 2019-03-20 DIAGNOSIS — D62 Acute posthemorrhagic anemia: Secondary | ICD-10-CM

## 2019-03-20 DIAGNOSIS — I495 Sick sinus syndrome: Secondary | ICD-10-CM | POA: Diagnosis present

## 2019-03-20 DIAGNOSIS — M9701XA Periprosthetic fracture around internal prosthetic right hip joint, initial encounter: Secondary | ICD-10-CM

## 2019-03-20 DIAGNOSIS — I252 Old myocardial infarction: Secondary | ICD-10-CM

## 2019-03-20 DIAGNOSIS — E785 Hyperlipidemia, unspecified: Secondary | ICD-10-CM | POA: Diagnosis present

## 2019-03-20 DIAGNOSIS — S72002D Fracture of unspecified part of neck of left femur, subsequent encounter for closed fracture with routine healing: Secondary | ICD-10-CM

## 2019-03-20 DIAGNOSIS — S72002S Fracture of unspecified part of neck of left femur, sequela: Secondary | ICD-10-CM | POA: Diagnosis not present

## 2019-03-20 LAB — GLUCOSE, CAPILLARY
Glucose-Capillary: 132 mg/dL — ABNORMAL HIGH (ref 70–99)
Glucose-Capillary: 138 mg/dL — ABNORMAL HIGH (ref 70–99)
Glucose-Capillary: 214 mg/dL — ABNORMAL HIGH (ref 70–99)
Glucose-Capillary: 201 mg/dL — ABNORMAL HIGH (ref 70–99)

## 2019-03-20 LAB — PROTIME-INR
INR: 2 — ABNORMAL HIGH (ref 0.8–1.2)
Prothrombin Time: 22.3 seconds — ABNORMAL HIGH (ref 11.4–15.2)

## 2019-03-20 MED ORDER — POLYETHYLENE GLYCOL 3350 17 G PO PACK: 17.0000 g | PACK | Freq: Every day | ORAL | 0 refills | Status: DC

## 2019-03-20 MED ORDER — GUAIFENESIN ER 600 MG PO TB12
600.0000 mg | ORAL_TABLET | Freq: Two times a day (BID) | ORAL | Status: DC | PRN
  Administered 2019-03-21: 20:00:00 600 mg via ORAL
  Filled 2019-03-20: qty 1

## 2019-03-20 MED ORDER — METHOCARBAMOL 500 MG PO TABS
500.0000 mg | ORAL_TABLET | Freq: Four times a day (QID) | ORAL | Status: DC | PRN
  Administered 2019-03-21 – 2019-03-29 (×7): 500 mg via ORAL
  Filled 2019-03-20 (×8): qty 1

## 2019-03-20 MED ORDER — PROCHLORPERAZINE 25 MG RE SUPP: 12.5000 mg | Freq: Four times a day (QID) | RECTAL | Status: DC | PRN

## 2019-03-20 MED ORDER — ACETAMINOPHEN 325 MG PO TABS
325.0000 mg | ORAL_TABLET | ORAL | Status: DC | PRN
  Administered 2019-03-24 – 2019-03-31 (×4): 650 mg via ORAL
  Filled 2019-03-20 (×4): qty 2

## 2019-03-20 MED ORDER — DOCUSATE SODIUM 100 MG PO CAPS
100.0000 mg | ORAL_CAPSULE | Freq: Two times a day (BID) | ORAL | Status: DC
  Administered 2019-03-20 – 2019-03-21 (×3): 100 mg via ORAL
  Filled 2019-03-20 (×4): qty 1

## 2019-03-20 MED ORDER — ATORVASTATIN CALCIUM 10 MG PO TABS
20.0000 mg | ORAL_TABLET | Freq: Every day | ORAL | Status: DC
  Administered 2019-03-20 – 2019-04-10 (×22): 20 mg via ORAL
  Filled 2019-03-20 (×23): qty 2

## 2019-03-20 MED ORDER — VITAMIN B-12 1000 MCG PO TABS
1000.0000 ug | ORAL_TABLET | Freq: Every day | ORAL | Status: DC
  Administered 2019-03-21 – 2019-04-11 (×22): 1000 ug via ORAL
  Filled 2019-03-20 (×22): qty 1

## 2019-03-20 MED ORDER — ADULT MULTIVITAMIN W/MINERALS CH
1.0000 | ORAL_TABLET | Freq: Every day | ORAL | Status: DC
  Administered 2019-03-21 – 2019-04-11 (×22): 1 via ORAL
  Filled 2019-03-20 (×22): qty 1

## 2019-03-20 MED ORDER — CARVEDILOL 3.125 MG PO TABS
3.1250 mg | ORAL_TABLET | Freq: Two times a day (BID) | ORAL | Status: DC
  Administered 2019-03-20 – 2019-04-11 (×42): 3.125 mg via ORAL
  Filled 2019-03-20 (×45): qty 1

## 2019-03-20 MED ORDER — PHENOL 1.4 % MT LIQD: 1.0000 | OROMUCOSAL | Status: DC | PRN

## 2019-03-20 MED ORDER — IPRATROPIUM-ALBUTEROL 0.5-2.5 (3) MG/3ML IN SOLN: 3.0000 mL | Freq: Two times a day (BID) | RESPIRATORY_TRACT | Status: DC

## 2019-03-20 MED ORDER — FLEET ENEMA 7-19 GM/118ML RE ENEM: 1.0000 | ENEMA | Freq: Once | RECTAL | Status: DC | PRN

## 2019-03-20 MED ORDER — MUPIROCIN 2 % EX OINT
TOPICAL_OINTMENT | Freq: Two times a day (BID) | CUTANEOUS | Status: DC
  Administered 2019-03-20 – 2019-04-11 (×42): via NASAL
  Filled 2019-03-20: qty 22

## 2019-03-20 MED ORDER — SENNOSIDES-DOCUSATE SODIUM 8.6-50 MG PO TABS
2.0000 | ORAL_TABLET | Freq: Two times a day (BID) | ORAL | Status: DC
  Administered 2019-03-20 – 2019-03-23 (×7): 2 via ORAL
  Filled 2019-03-20 (×8): qty 2

## 2019-03-20 MED ORDER — SENNOSIDES-DOCUSATE SODIUM 8.6-50 MG PO TABS: 2.0000 | ORAL_TABLET | Freq: Two times a day (BID) | ORAL | Status: DC

## 2019-03-20 MED ORDER — WARFARIN SODIUM 5 MG PO TABS: 5.0000 mg | ORAL_TABLET | Freq: Once | ORAL | Status: DC

## 2019-03-20 MED ORDER — GABAPENTIN 300 MG PO CAPS
300.0000 mg | ORAL_CAPSULE | Freq: Three times a day (TID) | ORAL | Status: DC
  Administered 2019-03-20 – 2019-04-11 (×65): 300 mg via ORAL
  Filled 2019-03-20 (×65): qty 1

## 2019-03-20 MED ORDER — DIPHENHYDRAMINE HCL 12.5 MG/5ML PO ELIX: 12.5000 mg | ORAL_SOLUTION | Freq: Four times a day (QID) | ORAL | Status: DC | PRN

## 2019-03-20 MED ORDER — TRAMADOL HCL 50 MG PO TABS
50.0000 mg | ORAL_TABLET | Freq: Four times a day (QID) | ORAL | Status: DC | PRN
  Administered 2019-03-21 – 2019-03-25 (×6): 50 mg via ORAL
  Filled 2019-03-20 (×7): qty 1

## 2019-03-20 MED ORDER — WARFARIN - PHARMACIST DOSING INPATIENT
Freq: Every day | Status: DC
  Administered 2019-03-23 – 2019-04-10 (×6)

## 2019-03-20 MED ORDER — WARFARIN SODIUM 5 MG PO TABS
5.0000 mg | ORAL_TABLET | Freq: Once | ORAL | Status: AC
  Administered 2019-03-20: 18:00:00 5 mg via ORAL
  Filled 2019-03-20: qty 1

## 2019-03-20 MED ORDER — INFLUENZA VAC A&B SA ADJ QUAD 0.5 ML IM PRSY
0.5000 mL | PREFILLED_SYRINGE | INTRAMUSCULAR | Status: AC
  Administered 2019-03-21: 12:00:00 0.5 mL via INTRAMUSCULAR
  Filled 2019-03-20: qty 0.5

## 2019-03-20 MED ORDER — BISACODYL 10 MG RE SUPP
10.0000 mg | Freq: Every day | RECTAL | Status: DC | PRN
  Administered 2019-03-22: 10 mg via RECTAL
  Filled 2019-03-20: qty 1

## 2019-03-20 MED ORDER — PROCHLORPERAZINE EDISYLATE 10 MG/2ML IJ SOLN: 5.0000 mg | Freq: Four times a day (QID) | INTRAMUSCULAR | Status: DC | PRN

## 2019-03-20 MED ORDER — MENTHOL 3 MG MT LOZG: 1.0000 | LOZENGE | OROMUCOSAL | Status: DC | PRN

## 2019-03-20 MED ORDER — TRAZODONE HCL 50 MG PO TABS
25.0000 mg | ORAL_TABLET | Freq: Every evening | ORAL | Status: DC | PRN
  Administered 2019-03-20 – 2019-03-30 (×8): 50 mg via ORAL
  Filled 2019-03-20 (×12): qty 1

## 2019-03-20 MED ORDER — POLYETHYLENE GLYCOL 3350 17 G PO PACK
17.0000 g | PACK | Freq: Every day | ORAL | Status: DC | PRN
  Administered 2019-04-02: 06:00:00 17 g via ORAL
  Filled 2019-03-20: qty 1

## 2019-03-20 MED ORDER — GUAIFENESIN-DM 100-10 MG/5ML PO SYRP: 5.0000 mL | ORAL_SOLUTION | Freq: Four times a day (QID) | ORAL | Status: DC | PRN

## 2019-03-20 MED ORDER — HYDROCODONE-ACETAMINOPHEN 7.5-325 MG PO TABS
1.0000 | ORAL_TABLET | ORAL | Status: DC | PRN
  Administered 2019-03-20 – 2019-03-25 (×10): 1 via ORAL
  Filled 2019-03-20 (×11): qty 1

## 2019-03-20 MED ORDER — PROCHLORPERAZINE MALEATE 5 MG PO TABS: 5.0000 mg | ORAL_TABLET | Freq: Four times a day (QID) | ORAL | Status: DC | PRN

## 2019-03-20 MED ORDER — POLYETHYLENE GLYCOL 3350 17 G PO PACK
17.0000 g | PACK | Freq: Every day | ORAL | Status: DC
  Administered 2019-03-22: 17 g via ORAL
  Filled 2019-03-20 (×2): qty 1

## 2019-03-20 MED ORDER — FENOFIBRATE 160 MG PO TABS
160.0000 mg | ORAL_TABLET | Freq: Every day | ORAL | Status: DC
  Administered 2019-03-21 – 2019-04-11 (×22): 160 mg via ORAL
  Filled 2019-03-20 (×22): qty 1

## 2019-03-20 MED ORDER — FERROUS SULFATE 325 (65 FE) MG PO TABS
325.0000 mg | ORAL_TABLET | Freq: Three times a day (TID) | ORAL | Status: DC
  Administered 2019-03-20 – 2019-04-11 (×65): 325 mg via ORAL
  Filled 2019-03-20 (×65): qty 1

## 2019-03-20 MED ORDER — ALUM & MAG HYDROXIDE-SIMETH 200-200-20 MG/5ML PO SUSP: 30.0000 mL | ORAL | Status: DC | PRN

## 2019-03-20 MED ORDER — INSULIN ASPART 100 UNIT/ML ~~LOC~~ SOLN
0.0000 [IU] | Freq: Three times a day (TID) | SUBCUTANEOUS | Status: DC
  Administered 2019-03-20 – 2019-03-21 (×2): 3 [IU] via SUBCUTANEOUS
  Administered 2019-03-21: 1 [IU] via SUBCUTANEOUS
  Administered 2019-03-21 – 2019-03-22 (×2): 2 [IU] via SUBCUTANEOUS
  Administered 2019-03-23: 1 [IU] via SUBCUTANEOUS
  Administered 2019-03-23: 13:00:00 2 [IU] via SUBCUTANEOUS
  Administered 2019-03-24: 1 [IU] via SUBCUTANEOUS
  Administered 2019-03-25: 18:00:00 2 [IU] via SUBCUTANEOUS
  Administered 2019-03-27: 12:00:00 1 [IU] via SUBCUTANEOUS
  Administered 2019-03-28 – 2019-03-29 (×2): 2 [IU] via SUBCUTANEOUS
  Administered 2019-03-30 – 2019-03-31 (×2): 1 [IU] via SUBCUTANEOUS
  Administered 2019-04-01: 17:00:00 2 [IU] via SUBCUTANEOUS
  Administered 2019-04-02: 18:00:00 1 [IU] via SUBCUTANEOUS
  Administered 2019-04-04 – 2019-04-06 (×3): 2 [IU] via SUBCUTANEOUS
  Administered 2019-04-07: 1 [IU] via SUBCUTANEOUS
  Administered 2019-04-08 – 2019-04-09 (×2): 2 [IU] via SUBCUTANEOUS
  Administered 2019-04-10: 1 [IU] via SUBCUTANEOUS

## 2019-03-20 MED ORDER — INSULIN GLARGINE 100 UNIT/ML ~~LOC~~ SOLN
40.0000 [IU] | Freq: Every day | SUBCUTANEOUS | Status: DC
  Administered 2019-03-21 – 2019-03-25 (×5): 40 [IU] via SUBCUTANEOUS
  Filled 2019-03-20 (×5): qty 0.4

## 2019-03-20 NOTE — Progress Notes (Signed)
Report given to whitney in 30mw, pt belongings gathered and transferred to 77mw room 8.  Mae Denunzio Elon Spanner, RN 03/20/2019 3:36 PM

## 2019-03-20 NOTE — Progress Notes (Signed)
Occupational Therapy Treatment Patient Details Name: Oscar Baker MRN: [id number] DOB: 08/29/1932 Today's Date: 03/20/2019    History of present illness 85 yo male with onset of trip at home resulted in fall and fracture of R femur adjacent to prosthetic components of hip.  MD performed ORIF on 9/23, MD upgraded to [facility] on RLE.  PMHx:  R BBB, shingles, HTN, pacemaker, CHF, DM, prostate and bladder CA, MI, PAF, CLL   OT comments  Pt making steady progress towards OT goals this session. Session focus on seated ADLs at sink and functional transfer training. Pt set-up/ supervision for seated ADLs at sink. Pt MAX A +2 stand pivot from recliner > EOB with RW. Pt utilized rocking forward method to gain momentum, but required MAX A to maintain WB status and initiate pivot steps to EOB during transfer.  Overall, pt limited by pain and fatigue during session . MAX A +2 for sit>supine needing most assist lifting legs back to bed. Pt likely to DC to CIR today; will continue to follow acutely for OT needs.    Follow Up Recommendations  CIR;Supervision/Assistance - 24 hour    Equipment Recommendations       Recommendations for Other Services      Precautions / Restrictions Precautions Precautions: Fall Precaution Comments: premedicate Restrictions Weight Bearing Restrictions: Yes RLE Weight Bearing: Partial weight bearing RLE Partial Weight Bearing Percentage or Pounds: 50 Other Position/Activity Restrictions: TDWB but should be NWB       Mobility Bed Mobility   Bed Mobility: Sit to Supine       Sit to supine: Max assist;+2 for physical assistance;+2 for safety/equipment   General bed mobility comments: pt requiring physical assist to mobilize RLE  Transfers Overall transfer level: Needs assistance Equipment used: Rolling walker (2 wheeled) Transfers: Sit to/from Stand Sit to Stand: Max assist;+2 physical assistance         General transfer comment: MAX +2 for stand pivot  transferback to bed; utilized rocking method to power up; verbal tactilec cues to maintain WB status and to initiate pivot steps to EOB; pt unable to step backwards towards bed sitting too early; education on waiting to feel sitting surface on back of legs before descending to surface    Balance Overall balance assessment: Needs assistance Sitting-balance support: Bilateral upper extremity supported;Feet supported Sitting balance-Leahy Scale: Fair     Standing balance support: Bilateral upper extremity supported Standing balance-Leahy Scale: Poor                             ADL either performed or assessed with clinical judgement   ADL Overall ADL's : Needs assistance/impaired     Grooming: Wash/dry face;Wash/dry hands;Supervision/safety;Set up;Sitting(shaving seated at sink) Grooming Details (indicate cue type and reason): pt supervision/ set- up for seated grooming at sink                     Toileting- Clothing Manipulation and Hygiene: Maximal assistance;+2 for physical assistance;Cueing for safety;Cueing for sequencing;Sit to/from stand Toileting - Clothing Manipulation Details (indicate cue type and reason): MAX A +2 for stand pivot from recliner> EOB with RW;pt required verbal cues to initiate pivotal steps to EOB     Functional mobility during ADLs: Maximal assistance;Rolling walker;Cueing for safety;Cueing for sequencing General ADL Comments: limited by pain even seated at sink for grooming d/t back pain; overall superivison/ set-up  for seated grooming     Vision Baseline  Vision/History: Wears glasses Wears Glasses: Reading only Patient Visual Report: No change from baseline Vision Assessment?: No apparent visual deficits   Perception     Praxis      Cognition Arousal/Alertness: Awake/alert Behavior During Therapy: WFL for tasks assessed/performed Overall Cognitive Status: Within Functional Limits for tasks assessed                                  General Comments: very HOH but [facility] for ADL tasks; can become slightly agitated secondary to increased pain        Exercises     Shoulder Instructions       General Comments bruising and hematoma like abscess on back- RN aware    Pertinent Vitals/ Pain       Pain Assessment: Faces Faces Pain Scale: Hurts whole lot Pain Location: R hip and leg Pain Descriptors / Indicators: Grimacing;Operative site guarding;Discomfort;Moaning;Guarding;Heaviness Pain Intervention(s): Limited activity within patient's tolerance;Monitored during session;Repositioned  Home Living                                          Prior Functioning/Environment              Frequency  Min 2X/week        Progress Toward Goals  OT Goals(current goals can now be found in the care plan section)  Progress towards OT goals: Progressing toward goals  Acute Rehab OT Goals Patient Stated Goal: to continue therapy OT Goal Formulation: With patient Time For Goal Achievement: 03/25/19 Potential to Achieve Goals: Good  Plan Discharge plan remains appropriate    Co-evaluation                 AM-PAC OT "6 Clicks" Daily Activity     Outcome Measure   Help from another person eating meals?: A Little Help from another person taking care of personal grooming?: A Little Help from another person toileting, which includes using toliet, bedpan, or urinal?: A Lot Help from another person bathing (including washing, rinsing, drying)?: A Lot Help from another person to put on and taking off regular upper body clothing?: A Little Help from another person to put on and taking off regular lower body clothing?: A Lot 6 Click Score: 15    End of Session Equipment Utilized During Treatment: Gait belt;Rolling walker  OT Visit Diagnosis: Unsteadiness on feet (R26.81);Repeated falls (R29.6);Muscle weakness (generalized) (M62.81);Pain   Activity Tolerance Patient limited by  pain;Patient tolerated treatment well   Patient Left in bed;with call bell/phone within reach;with bed alarm set;with family/visitor present   Nurse Communication Mobility status        Time: [id number] OT Time Calculation (min): 58 min  Charges: OT General Charges $OT Visit: 1 Visit OT Treatments $Self Care/Home Management : 38-52 mins $Therapeutic Activity: 8-22 mins [place], [place] [facility] [phone] [place] 03/20/2019, 3:07 PM

## 2019-03-20 NOTE — Progress Notes (Signed)
Oscar Heys, MD  Physician  Physical Medicine and Rehabilitation  PMR Pre-admission  Signed  Date of Service:  03/20/2019 11:59 AM      Related encounter: ED to Hosp-Admission (Discharged) from 03/09/2019 in [place]         Show:Clear all _0 Manual_1 Template_2 Copied  Added by: _3 Lovorn, Megan, MD_4 Oscar Baker, PT  _5 Hover for details PMR Admission Coordinator Pre-Admission Assessment  Patient: Oscar Baker is an 85 y.o., male MRN: [id number] DOB: 07-21-1932 Height: 5' 5.98" (167.6 cm) Weight: 92.5 kg  Insurance Information HMO:     PPO:  PCP:  IPA:      80/20:      OTHER:  PRIMARY: Medicare Baker and B      Policy#: [id number]      Subscriber: patient CM Name:       Phone#:      Fax#:  Pre-Cert#: verified eligibility online      Employer:  Benefits:  Phone #:      Name:  Eff. Date: 07/24/1999 Baker and B     Deduct: $1408      Out of Pocket Max:       Life Max:  CIR:100%       SNF: 20 full days Outpatient:      Co-Pay:  Home Health:       Co-Pay:  DME:      Co-Pay:  Providers:  SECONDARY: BCBS Supplement      Policy#: [id number]      Subscriber:  CM Name:       Phone#:      Fax#:  Pre-Cert#:       Employer:  Benefits:  Phone #:      Name:  Eff. Date:      Deduct:       Out of Pocket Max:       Life Max:  CIR:       SNF:  Outpatient:      Co-Pay:  Home Health:       Co-Pay:  DME:      Co-Pay:   Medicaid Application Date:       Case Manager:  Disability Application Date:       Case Worker:   The "Data Collection Information Summary" for patients in Inpatient Rehabilitation Facilities with attached "Privacy Act [place] Records" was provided and verbally reviewed with: Patient and Family  Emergency Contact Information         Contact Information    Name Relation Home Work Oscar Baker Spouse [phone]      Current Medical History   Patient Admitting Diagnosis: R hip periprosthetic fracture  History of Present Illness: Oscar Baker is an 85 year old male with history of AF, bradycardia s/p PPM, T2DM, CLL, and anemia who was admitted on 03/10/19 with fall backwards, pt twisted mid-fall and struck his chest and midback. He was found to have minimally displaced right periprosthetic hip fracture and mild bruising on back with hematoma. Chronic coumadin allowed to drift down and bridged with heparin prior to surgery. Hospital course significant for rate controlled Baker flutter, leucocytosis, poorly controlled BS as well as limitations due to pain. He underwent ORIF right hip on 09/23 by Oscar. Alvan Baker and to be [facility] for 6-8 weeks. Coumadin resumed and is therapeutic. Hospital course significant for ABLA with rise in WBC from 33-->43.4-->41 and hyponatremia. No fevers, but BP has  been soft. Therapy evaluations completed and CIR recommended.    Patient's medical record from [facility] has been reviewed by the rehabilitation admission coordinator and physician.  Past Medical History      Past Medical History:  Diagnosis Date  . Asthma 1950's   history of  . Atrial fibrillation ([place])   . Bilateral carpal tunnel syndrome   . Bilateral lower extremity edema   . Bladder tumor   . Chronic systolic heart failure (HCC)    Echo 1/19: Mild LVH, EF 45-50, inf HK, MAC, severe LAE, severe RAE // Echo 7/15: Mild LVH, mod focal basal sept hypertrophy, EF 55-60, AV peak and mean 16/9, trivial MR, mod LAE, PASP 38  . CLL (chronic lymphocytic leukemia) [facility]) oncologist-  Oscar Oscar Baker--   dx [phone] ;  Lymphocytosis, CLL - per lov note 05-11-2017 currently under active survillance,  CT 04-17-2014 show very small lymphadenopathy, no indication for treatment  . Coronary artery disease    cardiologist-  Oscar Oscar Baker--  08-18-2017 Intermittant risk nuclear study w/ large area of inferior infartion with no evidence ishcemia   . Deafness in  right ear   . Diabetes mellitus type 2, noninsulin dependent ([place])   . Elevated PSA    since prostatectomy but now resolved  . Hematuria 04/2017  . History of ear infection    Right  . History of MI (myocardial infarction)    per myoview nuclear study 08-18-2017 , unknown when  . History of shingles 08/2017   L ear and scalp, possible  . Hyperlipidemia   . Hypertension   . Ischemic cardiomyopathy 09/01/2017   Presumed +CAD with Nuclear stress test 08/18/17 - Inferior scar, no ischemia, intermediate risk // med management unless +angina or worse dyspnea  . OA (osteoarthritis)   . Pacemaker 02/08/2014   followed by Oscar Baker--  single chamber Biotronik due to SSS  . Permanent atrial fibrillation   . Pneumonia 2019   Left lung  . Prostate cancer [facility]) urologist-  Oscar Baker   dx 2004--  Gleason 8, PSA 10.45--  11-28-2002  s/p  radical prostatectomy;  recurrent w/ increasing PSA, started ADT treatment  . RBBB (right bundle branch block)   . Sick sinus syndrome ([place])    Baker-Flutter with episodes of bradycardia; S/P Biotronik (serial number [id number]) 02-08-2014  . Urinary incontinence   . Wears hearing aid in right ear    receiver and transmitter    Family History   family history includes Allergic rhinitis in his brother and mother; Emphysema in his mother; Heart attack in his father; Pulmonary fibrosis in his brother.  Prior Rehab/Hospitalizations Has the patient had prior rehab or hospitalizations prior to admission? No  Has the patient had major surgery during 100 days prior to admission? Yes             Current Medications  Current Facility-Administered Medications:  .  acetaminophen (TYLENOL) tablet 650 mg, 650 mg, Oral, Q6H PRN, Blount, Xenia T, NP, 650 mg at 03/18/19 2359 .  atorvastatin (LIPITOR) tablet 20 mg, 20 mg, Oral, q1800, Babish, Matthew, PA-C, 20 mg at 03/19/19 1723 .  carvedilol (COREG) tablet 3.125 mg, 3.125 mg, Oral, BID WC,  Babish, Matthew, PA-C, 3.125 mg at 03/20/19 0820 .  Chlorhexidine Gluconate Cloth 2 % PADS 6 each, 6 each, Topical, Daily, Danae Orleans, PA-C, 6 each at 03/19/19 0911 .  docusate sodium (COLACE) capsule 100 mg, 100 mg, Oral, BID, Danae Orleans, PA-C, 100 mg at 03/20/19  1133 .  fenofibrate tablet 160 mg, 160 mg, Oral, Daily, Danae Orleans, PA-C, 160 mg at 03/20/19 1135 .  ferrous sulfate tablet 325 mg, 325 mg, Oral, TID PC, Danae Orleans, PA-C, 325 mg at 03/20/19 7048 .  gabapentin (NEURONTIN) capsule 300 mg, 300 mg, Oral, TID, Babish, Matthew, PA-C, 300 mg at 03/20/19 1133 .  guaiFENesin (MUCINEX) 12 hr tablet 600 mg, 600 mg, Oral, BID PRN, Danae Orleans, PA-C, 600 mg at 03/18/19 0904 .  HYDROcodone-acetaminophen (NORCO) 7.5-325 MG per tablet 1 tablet, 1 tablet, Oral, Q6H PRN, Shelly Coss, MD, 1 tablet at 03/19/19 2207 .  insulin aspart (novoLOG) injection 0-9 Units, 0-9 Units, Subcutaneous, TID WC, Babish, Rodman Key, PA-C, 1 Units at 03/20/19 8891 .  insulin glargine (LANTUS) injection 40 Units, 40 Units, Subcutaneous, Daily, Shelly Coss, MD, 40 Units at 03/20/19 1134 .  ipratropium-albuterol (DUONEB) 0.5-2.5 (3) MG/3ML nebulizer solution 3 mL, 3 mL, Nebulization, TID, Adhikari, Amrit, MD, 3 mL at 03/20/19 0805 .  lactated ringers infusion, , Intravenous, Continuous, Norman Herrlich, Stopped at 03/16/19 1523 .  lactated ringers infusion, , Intravenous, Continuous, Norman Herrlich, Stopped at 03/16/19 1523 .  menthol-cetylpyridinium (CEPACOL) lozenge 3 mg, 1 lozenge, Oral, PRN **OR** phenol (CHLORASEPTIC) mouth spray 1 spray, 1 spray, Mouth/Throat, PRN, Babish, Matthew, PA-C .  methocarbamol (ROBAXIN) tablet 500 mg, 500 mg, Oral, Q6H PRN, 500 mg at 03/18/19 2359 **OR** methocarbamol (ROBAXIN) 500 mg in dextrose 5 % 50 mL IVPB, 500 mg, Intravenous, Q6H PRN, Babish, Matthew, PA-C .  metoCLOPramide (REGLAN) tablet 5-10 mg, 5-10 mg, Oral, Q8H PRN **OR** metoCLOPramide (REGLAN)  injection 5-10 mg, 5-10 mg, Intravenous, Q8H PRN, Babish, Matthew, PA-C .  morphine 2 MG/ML injection 1 mg, 1 mg, Intravenous, Q2H PRN, Babish, Matthew, PA-C, 1 mg at 03/16/19 2107 .  multivitamin with minerals tablet 1 tablet, 1 tablet, Oral, Daily, Danae Orleans, PA-C, 1 tablet at 03/20/19 1133 .  mupirocin ointment (BACTROBAN) 2 %, , Nasal, BID, Babish, Matthew, PA-C .  ondansetron (ZOFRAN) tablet 4 mg, 4 mg, Oral, Q6H PRN **OR** ondansetron (ZOFRAN) injection 4 mg, 4 mg, Intravenous, Q6H PRN, Babish, Matthew, PA-C .  polyethylene glycol (MIRALAX / GLYCOLAX) packet 17 g, 17 g, Oral, Daily, Adhikari, Amrit, MD, 17 g at 03/20/19 1134 .  senna-docusate (Senokot-S) tablet 2 tablet, 2 tablet, Oral, BID, Shelly Coss, MD, 2 tablet at 03/20/19 1131 .  sodium phosphate (FLEET) 7-19 GM/118ML enema 1 enema, 1 enema, Rectal, Once, Shelly Coss, MD, Stopped at 03/19/19 1200 .  vitamin B-12 (CYANOCOBALAMIN) tablet 1,000 mcg, 1,000 mcg, Oral, Daily, Babish, Matthew, PA-C, 1,000 mcg at 03/20/19 1133 .  warfarin (COUMADIN) tablet 5 mg, 5 mg, Oral, ONCE-1800, Shelly Coss, MD .  Warfarin - Pharmacist Dosing Inpatient, , Does not apply, q1800, Karren Cobble, RPH  Patients Current Diet:     Diet Order                  Diet - low sodium heart healthy         Diet Carb Modified Fluid consistency: Thin; Room service appropriate? Yes  Diet effective now               Precautions / Restrictions Precautions Precautions: Fall Precaution Comments: premedicate Restrictions Weight Bearing Restrictions: (P) Yes RLE Weight Bearing: (P) Partial weight bearing RLE Partial Weight Bearing Percentage or Pounds: (P) 50 Other Position/Activity Restrictions: TDWB but should be NWB   Has the patient had 2 or more falls or Baker fall with  injury in the past year? Yes  Prior Activity Level Limited Community (1-2x/wk): using RW at home. Did go out occasionally for errands and appointments   Prior Functional Level Self Care: Did the patient need help bathing, dressing, using the toilet or eating? Independent  Indoor Mobility: Did the patient need assistance with walking from room to room (with or without device)? Independent  Stairs: Did the patient need assistance with internal or external stairs (with or without device)? Independent  Functional Cognition: Did the patient need help planning regular tasks such as shopping or remembering to take medications? Needed some help  Home Assistive Devices / [place] Devices/Equipment: Gilford Rile (specify type), Hearing aid Home Equipment: Grab bars - toilet, Grab bars - tub/shower, Walker - 2 wheels  Prior Device Use: Indicate devices/aids used by the patient prior to current illness, exacerbation or injury? Walker  Current Functional Level Cognition  Overall Cognitive Status: Within Functional Limits for tasks assessed Orientation Level: (P) Oriented X4 General Comments: able to discuss home but wife there to addi details    Extremity Assessment (includes Sensation/Coordination)  Upper Extremity Assessment: Generalized weakness LUE Deficits / Details: shoulder weakness  Lower Extremity Assessment: Defer to PT evaluation RLE Deficits / Details: new ORIF RLE: Unable to fully assess due to pain, Unable to fully assess due to immobilization RLE Sensation: decreased proprioception RLE Coordination: decreased fine motor, decreased gross motor    ADLs  Overall ADL's : Needs assistance/impaired Eating/Feeding: Set up, Sitting Grooming: Wash/dry hands, Wash/dry face, Sitting, Set up Upper Body Bathing: Minimal assistance, Sitting, Cueing for safety, Cueing for sequencing Lower Body Bathing: Maximal assistance, +2 for physical assistance, +2 for safety/equipment, Cueing for safety, Cueing for sequencing, Sit to/from stand Upper Body Dressing : Minimal assistance, Cueing for safety, Cueing for sequencing,  Sitting Lower Body Dressing: Maximal assistance, +2 for physical assistance, +2 for safety/equipment, Cueing for safety, Cueing for sequencing, Sit to/from stand Toilet Transfer: Maximal assistance, +2 for physical assistance, +2 for safety/equipment Toileting- Clothing Manipulation and Hygiene: Maximal assistance, +2 for physical assistance, +2 for safety/equipment, Cueing for safety, Cueing for sequencing, Sit to/from stand Tub/ Shower Transfer: Maximal assistance, +2 for physical assistance, +2 for safety/equipment, Rolling walker Functional mobility during ADLs: Maximal assistance, Rolling walker, Cueing for safety, Cueing for sequencing General ADL Comments: cues on sequencing and WB status    Mobility  Overal bed mobility: Needs Assistance Bed Mobility: Supine to Sit Rolling: Mod assist Supine to sit: Mod assist Sit to supine: Max assist, +2 for physical assistance, +2 for safety/equipment General bed mobility comments: Pt required cues for hand placement and piillow between legs for comfort.  He reports he is unable to atttempt transfer OOB to commode at this time.  Pt extremely painful with rolling for bed pan placement.    Transfers  Overall transfer level: Needs assistance Equipment used: Rolling walker (2 wheeled) Transfers: Sit to/from Stand Sit to Stand: Mod assist, +2 physical assistance, +2 safety/equipment, From elevated surface General transfer comment: Pt performing 4 sit to stand trial. PT providing verbal cues for increased forward trunk lean and rocking forward to gain momentum    Ambulation / Gait / Stairs / Wheelchair Mobility  Ambulation/Gait Ambulation/Gait assistance: Herbalist (Feet): 3 Feet Assistive device: Rolling walker (2 wheeled) Gait Pattern/deviations: Step-to pattern, Shuffle General Gait Details: gait was really steps to get to chair Gait velocity: reduced Gait velocity interpretation: <1.31 ft/sec, indicative of household  ambulator    Posture / Balance Balance  Overall balance assessment: Needs assistance Sitting-balance support: Bilateral upper extremity supported, Feet supported Sitting balance-Leahy Scale: Fair Standing balance support: Bilateral upper extremity supported Standing balance-Leahy Scale: Poor    Special needs/care consideration BiPAP/CPAP no CPM no Continuous Drip IV no Dialysis no        Days n/Baker Life Vest no Oxygen no Special Bed no Trach Size no Wound Vac (area) no      Location n/Baker Skin ecchymosis to UE, back, and face, skin tear to RUE, MASD to sacrum                     Bowel mgmt: continent, last BM 9/27 Bladder mgmt: incontinent Diabetic mgmt: yes Behavioral consideration no Chemo/radiation no   Previous Home Environment (from acute therapy documentation) Living Arrangements: Spouse/significant other Available Help at Discharge: Family, Available 24 hours/day Type of Home: Apartment Home Layout: One level Home Access: Level entry Bathroom Shower/Tub: Multimedia programmer: Handicapped height Bathroom Accessibility: No Home Care Services: No Additional Comments: Pt is demonstrating poor control of RLE despite previous level of I  Discharge Living Setting Plans for Discharge Living Setting: Patient's home Type of Home at Discharge: Apartment Discharge Home Layout: One level Discharge Home Access: Level entry Discharge Bathroom Shower/Tub: Walk-in shower Discharge Bathroom Toilet: Handicapped height Discharge Bathroom Accessibility: Yes How Accessible: Accessible via walker Does the patient have any problems obtaining your medications?: No  Social/Family/Support Systems Patient Roles: Spouse Anticipated Caregiver: wife, Opal Sidles Anticipated Caregiver's Contact Information: [phone] Ability/Limitations of Caregiver: supervision only Caregiver Availability: 24/7 Discharge Plan Discussed with Primary Caregiver: Yes Is Caregiver In Agreement with  Plan?: Yes Does Caregiver/Family have Issues with Lodging/Transportation while Pt is in Rehab?: No  Goals/Additional Needs Patient/Family Goal for Rehab: PT/OT supervision Expected length of stay: 12-16 days Dietary Needs: carb modified/thin Additional Information: pt very HOH, hears better out of his L ear Pt/Family Agrees to Admission and willing to participate: Yes Program Orientation Provided & Reviewed with Pt/Caregiver Including Roles  & Responsibilities: Yes  Decrease burden of Care through IP rehab admission: n/Baker  Possible need for SNF placement upon discharge: not anticipated  Patient Condition: I have reviewed medical records from [facility], spoken with CSW, and patient and spouse. I met with patient at the bedside for inpatient rehabilitation assessment.  Patient will benefit from ongoing PT and OT, can actively participate in 3 hours of therapy Baker day 5 days of the week, and can make measurable gains during the admission.  Patient will also benefit from the coordinated team approach during an Inpatient Acute Rehabilitation admission.  The patient will receive intensive therapy as well as Rehabilitation physician, nursing, social worker, and care management interventions.  Due to bladder management, safety, skin/wound care, disease management, medication administration, pain management and patient education the patient requires 24 hour Baker day rehabilitation nursing.  The patient is currently min to mod with mobility and basic ADLs.  Discharge setting and therapy post discharge at home with home health is anticipated.  Patient has agreed to participate in the Acute Inpatient Rehabilitation Program and will admit today.  Preadmission Screen Completed By:  Oscar Baker, PT, DPT 03/20/2019 12:00 PM ______________________________________________________________________   Discussed status with Oscar. Dagoberto Ligas on 03/20/19  at 12:05 PM  and received approval for admission today.   Admission Coordinator:  Oscar Baker, PT, DPT time 12:05 PM Sudie Grumbling 03/20/19    Assessment/Plan: Diagnosis: 1. Does the need for close, 24 hr/day Medical supervision in concert  with the patient's rehab needs make it unreasonable for this patient to be served in Baker less intensive setting? Yes 2. Co-Morbidities requiring supervision/potential complications: CLL with leukocytosis, Baker Fib on coumadin needing titration, uncontrolled BGs with DM, bradycardia and Baker Flutter- OK rate. 3. Due to bladder management, bowel management, safety, skin/wound care, medication administration, pain management and patient education, does the patient require 24 hr/day rehab nursing? Yes 4. Does the patient require coordinated care of Baker physician, rehab nurse, PT (1-2 hrs/day, 5 days/week) and OT (1-2 hrs/day, 5 days/week) to address physical and functional deficits in the context of the above medical diagnosis(es)? Yes Addressing deficits in the following areas: balance, endurance, locomotion, strength, bathing, dressing, grooming and toileting 5. Can the patient actively participate in an intensive therapy program of at least 3 hrs of therapy 5 days Baker week? Yes 6. The potential for patient to make measurable gains while on inpatient rehab is good 7. Anticipated functional outcomes upon discharge from inpatients are: supervision PT, modified independent OT, n/Baker SLP 8. Estimated rehab length of stay to reach the above functional goals is: 12-16 days 9. Anticipated D/C setting: Home 10. Anticipated post D/C treatments: [address] therapy 11. Overall Rehab/Functional Prognosis: good  MD Signature:         Revision History

## 2019-03-20 NOTE — Progress Notes (Signed)
Physical Therapy Treatment Patient Details Name: PINCHUS DISCHLER MRN: [id number] DOB: Mar 17, 1933 Today's Date: 03/20/2019    History of Present Illness 85 yo male with onset of trip at home resulted in fall and fracture of R femur adjacent to prosthetic components of hip.  MD performed ORIF on 9/23, MD upgraded to [facility] on RLE.  PMHx:  R BBB, shingles, HTN, pacemaker, CHF, DM, prostate and bladder CA, MI, PAF, CLL    PT Comments    Pt is limited by pain and fatigue during session, with increased work of breathing throughout session which he reports is his baseline. Pt continues to require significant physical assistance to perform bed mobility and functional transfers due to generalized strength deficits. Pt will benefit from continued acute PT services to reduce falls risk and restore his prior level of function. Recommendations: Pt will benefit from high intensity inpatient PT services upon discharge to aide in a progression to their prior level of function.    Follow Up Recommendations  CIR     Equipment Recommendations  None recommended by PT    Recommendations for Other Services       Precautions / Restrictions Precautions Precautions: Fall Restrictions Weight Bearing Restrictions: Yes RLE Weight Bearing: Partial weight bearing RLE Partial Weight Bearing Percentage or Pounds: 50    Mobility  Bed Mobility Overal bed mobility: Needs Assistance Bed Mobility: Supine to Sit     Supine to sit: Mod assist     General bed mobility comments: pt requiring physical assist to mobilize RLE  Transfers Overall transfer level: Needs assistance Equipment used: Rolling walker (2 wheeled) Transfers: Sit to/from Stand Sit to Stand: Mod assist;From elevated surface         General transfer comment: PT continuing to reinforce forward lean during stand  Ambulation/Gait Ambulation/Gait assistance: Mod assist;+2 safety/equipment Gait Distance (Feet): 3 Feet Assistive device: Rolling  walker (2 wheeled) Gait Pattern/deviations: Step-to pattern;Shuffle Gait velocity: reduced Gait velocity interpretation: <1.31 ft/sec, indicative of household ambulator General Gait Details: pt without foot clearance of BLE due to weakness and pain   Stairs             Wheelchair Mobility    Modified Rankin (Stroke Patients Only)       Balance Overall balance assessment: Needs assistance Sitting-balance support: Bilateral upper extremity supported;Feet supported Sitting balance-Leahy Scale: Fair     Standing balance support: Bilateral upper extremity supported Standing balance-Leahy Scale: Poor                              Cognition Arousal/Alertness: Awake/alert Behavior During Therapy: WFL for tasks assessed/performed Overall Cognitive Status: Within Functional Limits for tasks assessed                                        Exercises General Exercises - Lower Extremity Ankle Circles/Pumps: AROM;20 reps;Both Quad Sets: AROM;10 reps;Both Gluteal Sets: AROM;5 reps;Both Short Arc Quad: AAROM;Right;5 reps    General Comments        Pertinent Vitals/Pain Pain Assessment: Faces Faces Pain Scale: Hurts even more Pain Location: R hip and leg Pain Descriptors / Indicators: Grimacing;Operative site guarding Pain Intervention(s): Limited activity within patient's tolerance    Home Living                      Prior Function  PT Goals (current goals can now be found in the care plan section) Progress towards PT goals: Progressing toward goals    Frequency    Min 5X/week      PT Plan Current plan remains appropriate    Co-evaluation              AM-PAC PT "6 Clicks" Mobility   Outcome Measure  Help needed turning from your back to your side while in a flat bed without using bedrails?: A Little Help needed moving from lying on your back to sitting on the side of a flat bed without using  bedrails?: A Lot Help needed moving to and from a bed to a chair (including a wheelchair)?: A Lot Help needed standing up from a chair using your arms (e.g., wheelchair or bedside chair)?: A Lot Help needed to walk in hospital room?: A Lot Help needed climbing 3-5 steps with a railing? : Total 6 Click Score: 12    End of Session Equipment Utilized During Treatment: Gait belt Activity Tolerance: Patient limited by pain;Patient limited by fatigue Patient left: in chair;with call bell/phone within reach Nurse Communication: Mobility status PT Visit Diagnosis: Unsteadiness on feet (R26.81);Muscle weakness (generalized) (M62.81);History of falling (Z91.81)     Time: [id number] PT Time Calculation (min) (ACUTE ONLY): 44 min  Charges:  $Therapeutic Exercise: 8-22 mins $Therapeutic Activity: 23-37 mins                     Zenaida Niece, PT, DPT Acute Rehabilitation Pager: [phone]    Zenaida Niece 03/20/2019, 12:24 PM

## 2019-03-20 NOTE — Discharge Summary (Signed)
Physician Discharge Summary  Oscar Baker [id number] DOB: [birth year] DOA: 83/17/2020  PCP: [name], MD  Admit date: 03/09/2019 Discharge date: 03/20/2019  Admitted From: Home Disposition:  SNF  Discharge Condition:Stable CODE STATUS: DNR Diet recommendation: Heart Healthy  Brief/Interim Summary:  Patient is 85 male with history of atrial fibrillation, pacemaker placement for bradycardia, diastolic function, CHF, anemia, diabetes mellitus, prostate cancer who was brought from home after he fell.  Patient was found to have right hip periprosthetic fracture.  Orthopedics consulted and he underwent ORIF.  PT recommended CIR. Patient is hemodynamically stable for discharge to CIR today.  Following problems were addressed during his hospitalization:  Right-sided periprosthetic hip fracture: POD 5.  Status post open reduction internal fixation.  Continue DVT prophylaxis.  He is already on Coumadin.  PT/OT consulted.  PT recommended CIR.   Continue pain management.  Continue supportive care.  History of A. fib/status post pacemaker placement: Currently rate is controlled.  INR was subtherapeutic on presentation.  Started back on Coumadin.  Continue to monitor INR.  CLL: Currently in remission and stable.  Has elevated leukocytosis on baseline.  Hyperlipidemia: Continue statin  Diabetes type 2: Continue home regimen.  Constipation: Continue bowel regimen.  Discharge Diagnoses:  Principal Problem:   Closed right hip fracture, initial encounter ([place]) Active Problems:   PAF (paroxysmal atrial fibrillation) ([place])   Diabetes mellitus type 2, noninsulin dependent ([facility])   Laryngopharyngeal reflux   CLL (chronic lymphocytic leukemia) ([facility])   Type 2 diabetes mellitus ([place])    Discharge Instructions  Discharge Instructions    Diet - low sodium heart healthy   Complete by: As directed    Discharge instructions   Complete by: As directed    1)Follow up with orthopedics in 2  weeks.   Increase activity slowly   Complete by: As directed    Partial weight bearing   Complete by: As directed    % Body Weight: 50   Laterality: right   Extremity: Lower     Allergies as of 03/20/2019      Reactions   Altace [ramipril] Other (See Comments)   "throat felt like had a knot in it"   Augmentin [amoxicillin-pot Clavulanate] Diarrhea   severe   Codeine Nausea And Vomiting   Nausea and vomiting   Simvastatin Other (See Comments)   Leg aches      Medication List    STOP taking these medications   sodium chloride HYPERTONIC 3 % nebulizer solution     TAKE these medications   acetaminophen 500 MG tablet Commonly known as: TYLENOL Take 2 tablets (1,000 mg total) by mouth every 6 (six) hours.   atorvastatin 20 MG tablet Commonly known as: LIPITOR TAKE ONE TABLET EACH DAY AT 6PM What changed: See the new instructions.   carvedilol 3.125 MG tablet Commonly known as: COREG TAKE ONE TABLET TWICE DAILY What changed: when to take this   fenofibrate micronized 200 MG capsule Commonly known as: LOFIBRA TAKE ONE CAPSULE EACH DAY BEFORE BREAKFAST What changed: See the new instructions.   furosemide 20 MG tablet Commonly known as: LASIX TAKE ONE TABLET DAILY AS NEEDED What changed: See the new instructions.   gabapentin 300 MG capsule Commonly known as: Neurontin Take 1 capsule (300 mg total) by mouth 3 (three) times daily.   glipiZIDE 2.5 MG 24 hr tablet Commonly known as: GLUCOTROL XL Take 2.5 mg by mouth daily with breakfast.   guaiFENesin 600 MG 12 hr tablet Commonly known as:  MUCINEX Take 600 mg by mouth 2 (two) times daily as needed for cough.   HYDROcodone-homatropine 5-1.5 MG/5ML syrup Commonly known as: HYCODAN Take 5 mLs by mouth every 6 (six) hours as needed for cough.   HYDROmorphone 2 MG tablet Commonly known as: DILAUDID Take 1-2 tablets (2-4 mg total) by mouth every 6 (six) hours as needed for moderate pain or severe pain (1 pill scale  1-5; 2 pills scale 6-10).   ipratropium 0.02 % nebulizer solution Commonly known as: ATROVENT TAKE 2.20mL BY NEBULIZER 3 TIMES DAILY What changed: See the new instructions.   metFORMIN 1000 MG tablet Commonly known as: GLUCOPHAGE Take 1,000 mg by mouth 2 (two) times daily with a meal.   methocarbamol 500 MG tablet Commonly known as: Robaxin Take 1 tablet (500 mg total) by mouth every 6 (six) hours as needed for muscle spasms.   multivitamin with minerals Tabs tablet Take 1 tablet by mouth daily.   sitaGLIPtin 100 MG tablet Commonly known as: JANUVIA Take 100 mg by mouth daily.   Vios LC Sprint Deluxe Misc Use 1 each every 6 (six) hours as needed (shortness of breath or wheezing) Fax to [facility]   vitamin B-12 1000 MCG tablet Commonly known as: CYANOCOBALAMIN Take 1,000 mcg by mouth daily.   warfarin 5 MG tablet Commonly known as: COUMADIN Take as directed. If you are unsure how to take this medication, talk to your nurse or doctor. Original instructions: TAKE AS DIRECTED BY COUMADIN CLINIC What changed: See the new instructions.            Discharge Care Instructions  (From admission, onward)         Start     Ordered   03/16/19 0000  Partial weight bearing    Question Answer Comment  % Body Weight 50   Laterality right   Extremity Lower      03/16/19 0741         Follow-up Information    Paralee Cancel, MD. Schedule an appointment as soon as possible for a visit in 2 weeks.   Specialty: Orthopedic Surgery Contact information: [address] [place] [address] [place] [address] [phone]          Allergies  Allergen Reactions  . Altace [Ramipril] Other (See Comments)    "throat felt like had a knot in it"  . Augmentin [Amoxicillin-Pot Clavulanate] Diarrhea    severe  . Codeine Nausea And Vomiting    Nausea and vomiting   . Simvastatin Other (See Comments)    Leg aches    Consultations:  Ortho   Procedures/Studies: Dg Chest 1  View  Result Date: 03/10/2019 CLINICAL DATA:  Recent fall with chest pain, initial encounter EXAM: CHEST  1 VIEW COMPARISON:  05/20/2018 FINDINGS: Cardiac shadow is enlarged. Pacing device is again noted and stable. The lungs are well aerated bilaterally. No focal infiltrate or sizable effusion is seen. No acute bony abnormality is noted. IMPRESSION: No acute abnormality noted. Electronically Signed   By: Inez Catalina M.D.   On: 03/10/2019 00:40   Ct Abdomen Pelvis W Contrast  Addendum Date: 03/10/2019   ADDENDUM REPORT: 03/10/2019 01:56 ADDENDUM: At the level of T12-L1 posteriorly in the subcutaneous soft tissues, there is some increased attenuation identified likely related to underlying bruising although no sizable or expanding hematoma is noted. Electronically Signed   By: Inez Catalina M.D.   On: 03/10/2019 01:56   Result Date: 03/10/2019 CLINICAL DATA:  Recent fall EXAM: CT ABDOMEN AND  PELVIS WITH CONTRAST TECHNIQUE: Multidetector CT imaging of the abdomen and pelvis was performed using the standard protocol following bolus administration of intravenous contrast. CONTRAST:  195mL OMNIPAQUE IOHEXOL 300 MG/ML  SOLN COMPARISON:  04/17/2014 FINDINGS: Lower chest: Mild atelectatic changes are noted in the bases. No focal infiltrate is seen. Hepatobiliary: Small gallstone is noted within the gallbladder. The liver is within normal limits. Pancreas: Unremarkable. No pancreatic ductal dilatation or surrounding inflammatory changes. Spleen: Normal in size without focal abnormality. Adrenals/Urinary Tract: The adrenal glands are within normal limits. Kidneys demonstrate normal enhancement pattern bilaterally. Normal excretion is seen. No renal or ureteral calculi are seen. The bladder is decompressed. Stomach/Bowel: No obstructive or inflammatory changes of the colon are seen. The appendix is not well visualized consistent with prior surgical history. No small bowel abnormality is seen. No gastric abnormality is  noted. Vascular/Lymphatic: Aortic atherosclerosis. No enlarged abdominal or pelvic lymph nodes. Reproductive: Status post prostatectomy. Penile prosthesis is noted. Other: No abdominal wall hernia or abnormality. No abdominopelvic ascites. Musculoskeletal: Bilateral femoral prosthesis are seen. Periprosthetic fracture is noted in the proximal right femur similar to that noted on prior plain film. Degenerative changes of the lumbar spine are noted. IMPRESSION: Periprosthetic fracture in the proximal right femur. Cholelithiasis without complicating factors. Chronic changes as described above. Electronically Signed: By: Inez Catalina M.D. On: 03/10/2019 01:38   Dg Bone Density (dxa)  Result Date: 03/07/2019 EXAM: DUAL X-RAY ABSORPTIOMETRY (DXA) FOR BONE MINERAL DENSITY IMPRESSION: Referring Physician:  Franchot Gallo Your patient completed a BMD test using Lunar IDXA DXA system ( analysis version: 16 ) manufactured by [employer]. Technologist: AW PATIENT: Name: Knowledge, Baquero Patient ID: [id number] Birth Date: Nov 28, 1932 Height: 69.0 in. Sex: Male Measured: 03/07/2019 Weight: 209.6 lbs. Indications: Advanced Age, Bilateral hip replacement, Caucasian, Gabapentin, Lupron, Prostate cancer Fractures: None Treatments: Calcium (E943.0), Vitamin D (E933.5) ASSESSMENT: The BMD measured at Forearm Radius 33% is 0.890 g/cm2 with a T-score of -1.1. This patient is considered osteopenic according to the [facility]) critiera. The scan quality is good. Right femur was excluded due to surgical hardware. Left femur was excluded due to surgical hardware. Lumbar spine was not utilized due to advanced degenerative changes. Site Region Measured Date Measured Age YA BMD Significant CHANGE T-score Left Forearm Radius 33% 03/07/2019 84.7 -1.1 0.890 g/cm2 World Health Organization [facility]) criteria for post-menopausal, Caucasian Women: Normal       T-score at or above -1 SD Osteopenia   T-score between -1 and -2.5 SD  Osteoporosis T-score at or below -2.5 SD RECOMMENDATION: 1. All patients should optimize calcium and vitamin D intake. 2. Consider FDA approved medical therapies in postmenopausal women and men aged 26 years and older, based on the following: a. A hip or vertebral (clinical or morphometric) fracture b. T- score < or = -2.5 at the femoral neck or spine after appropriate evaluation to exclude secondary causes c. Low bone mass (T-score between -1.0 and -2.5 at the femoral neck or spine) and a 10 year probability of a hip fracture > or = 3% or a 10 year probability of a major osteoporosis-related fracture > or = 20% based on the [place]-adapted WHO algorithm d. Clinician judgment and/or patient preferences may indicate treatment for people with 10-year fracture probabilities above or below these levels FOLLOW-UP: Patients with diagnosis of osteoporosis or at high risk for fracture should have regular bone mineral density tests. For patients eligible for Medicare, routine testing is allowed once every 2 years. The  testing frequency can be increased to one year for patients who have rapidly progressing disease, those who are receiving or discontinuing medical therapy to restore bone mass, or have additional risk factors. I have reviewed this report and agree with the above findings. [facility] Electronically Signed   By: Lowella Grip III M.D.   On: 03/07/2019 11:54   Dg Hip Port Unilat With Pelvis 1v Right  Result Date: 03/15/2019 CLINICAL DATA:  Status post surgical repair of right hip fracture. EXAM: DG HIP (WITH OR WITHOUT PELVIS) 1V PORT RIGHT COMPARISON:  Radiographs June 09, 2019. FINDINGS: Patient is status post surgical fixation of periprosthetic fracture involving the proximal right femur. Status post right total hip arthroplasty. IMPRESSION: Status post surgical internal fixation of proximal right femoral periprosthetic fracture. Electronically Signed   By: Marijo Conception M.D.   On: 03/15/2019  16:15   Dg Hip Unilat W Or Wo Pelvis 2-3 Views Right  Result Date: 03/10/2019 CLINICAL DATA:  Recent fall with leg pain, initial encounter EXAM: DG HIP (WITH OR WITHOUT PELVIS) 3V RIGHT COMPARISON:  None. FINDINGS: Bilateral hip replacements are noted. Mildly displaced fracture is noted in the proximal shaft of the femur surrounding the prosthesis. Pelvic ring is intact. Degenerative changes of lumbar spine are noted. Postoperative changes consistent with prostate surgery are noted. Penile prosthesis is noted. IMPRESSION: Minimally displaced periprosthetic proximal femoral fracture. Electronically Signed   By: Inez Catalina M.D.   On: 03/10/2019 00:41       Subjective: Patient seen and examined the bedside.  Hemodynamically stable for discharge.  Discharge Exam: Vitals:   03/20/19 0759 03/20/19 0805  BP: (!) 101/48   Pulse: 64   Resp: 16   Temp: 98.4 F (36.9 C)   SpO2: 96% 93%   Vitals:   03/20/19 0502 03/20/19 0510 03/20/19 0759 03/20/19 0805  BP: 124/70  (!) 101/48   Pulse: (!) 112 70 64   Resp: 16  16   Temp: 98.3 F (36.8 C)  98.4 F (36.9 C)   TempSrc: Oral  Oral   SpO2: 95%  96% 93%  Weight:      Height:        General: Pt is alert, awake, not in acute distress Cardiovascular: RRR, S1/S2 +, no rubs, no gallops Respiratory: CTA bilaterally, no wheezing, no rhonchi Abdominal: Soft, NT, ND, bowel sounds + Extremities: no edema, no cyanosis    The results of significant diagnostics from this hospitalization (including imaging, microbiology, ancillary and laboratory) are listed below for reference.     Microbiology: Recent Results (from the past 240 hour(s))  Surgical pcr screen     Status: Abnormal   Collection Time: 03/10/19  3:39 PM   Specimen: Nasal Mucosa; Nasal Swab  Result Value Ref Range Status   MRSA, PCR NEGATIVE NEGATIVE Final   Staphylococcus aureus POSITIVE (A) NEGATIVE Final    Comment: (NOTE) The Xpert SA Assay (FDA approved for NASAL specimens  in patients 64 years of age and older), is one component of a comprehensive surveillance program. It is not intended to diagnose infection nor to guide or monitor treatment. Performed at [facility], [place] [address] [place], [place] [address]      Labs: BNP (last 3 results) Recent Labs    05/20/18 1905  BNP 0000000*   Basic Metabolic Panel: Recent Labs  Lab 03/14/19 0400 03/15/19 0329 03/16/19 0244 03/17/19 0428  NA 132* 131* 132* 132*  K 5.0 4.8 4.5 4.9  CL 98 98 98 97*  CO2 24 24 28 28   GLUCOSE 311* 268* 173* 225*  BUN 26* 33* 29* 20  CREATININE 0.92 0.88 0.92 0.92  CALCIUM 8.3* 8.7* 8.3* 8.5*   Liver Function Tests: No results for input(s): AST, ALT, ALKPHOS, BILITOT, PROT, ALBUMIN in the last 168 hours. No results for input(s): LIPASE, AMYLASE in the last 168 hours. No results for input(s): AMMONIA in the last 168 hours. CBC: Recent Labs  Lab 03/14/19 0400 03/15/19 0329 03/16/19 0244 03/17/19 0428 03/18/19 0344 03/19/19 0338  WBC 37.7* 35.6* 33.1* 37.2* 43.4* 41.1*  NEUTROABS 9.5* 6.9 12.2* 7.4 7.6  --   HGB 9.7* 9.4* 8.9* 9.2* 9.0* 8.5*  HCT 29.3* 28.6* 26.9* 27.8* 27.0* 25.6*  MCV 99.0 97.9 99.3 99.3 99.6 99.6  PLT 353 406* 404* 462* 463* 464*   Cardiac Enzymes: No results for input(s): CKTOTAL, CKMB, CKMBINDEX, TROPONINI in the last 168 hours. BNP: Invalid input(s): POCBNP CBG: Recent Labs  Lab 03/19/19 0659 03/19/19 1156 03/19/19 1634 03/19/19 2206 03/20/19 0636  GLUCAP 182* 223* 158* 159* 132*   D-Dimer No results for input(s): DDIMER in the last 72 hours. Hgb A1c No results for input(s): HGBA1C in the last 72 hours. Lipid Profile No results for input(s): CHOL, HDL, LDLCALC, TRIG, CHOLHDL, LDLDIRECT in the last 72 hours. Thyroid function studies No results for input(s): TSH, T4TOTAL, T3FREE, THYROIDAB in the last 72 hours.  Invalid input(s): FREET3 Anemia work up No results for input(s): VITAMINB12, FOLATE, FERRITIN, TIBC,  IRON, RETICCTPCT in the last 72 hours. Urinalysis    Component Value Date/Time   COLORURINE YELLOW 11/27/2017 1454   APPEARANCEUR HAZY (A) 11/27/2017 1454   LABSPEC 1.018 11/27/2017 1454   PHURINE 7.0 11/27/2017 1454   GLUCOSEU >=500 (A) 11/27/2017 1454   HGBUR SMALL (A) 11/27/2017 1454   BILIRUBINUR NEGATIVE 11/27/2017 1454   KETONESUR NEGATIVE 11/27/2017 1454   PROTEINUR NEGATIVE 11/27/2017 1454   NITRITE NEGATIVE 11/27/2017 1454   LEUKOCYTESUR MODERATE (A) 11/27/2017 1454   Sepsis Labs Invalid input(s): PROCALCITONIN,  WBC,  LACTICIDVEN Microbiology Recent Results (from the past 240 hour(s))  Surgical pcr screen     Status: Abnormal   Collection Time: 03/10/19  3:39 PM   Specimen: Nasal Mucosa; Nasal Swab  Result Value Ref Range Status   MRSA, PCR NEGATIVE NEGATIVE Final   Staphylococcus aureus POSITIVE (A) NEGATIVE Final    Comment: (NOTE) The Xpert SA Assay (FDA approved for NASAL specimens in patients 83 years of age and older), is one component of a comprehensive surveillance program. It is not intended to diagnose infection nor to guide or monitor treatment. Performed at [facility], [place] [address], [place], [place] [address]     Please note: You were cared for by a hospitalist during your hospital stay. Once you are discharged, your primary care physician will handle any further medical issues. Please note that NO REFILLS for any discharge medications will be authorized once you are discharged, as it is imperative that you return to your primary care physician (or establish a relationship with a primary care physician if you do not have one) for your post hospital discharge needs so that they can reassess your need for medications and monitor your lab values.    Time coordinating discharge: 40 minutes  SIGNED:   Shelly Coss, MD  [facility] 03/20/2019, 11:13 AM Pager [phone]  If 7PM-7AM, please contact  night-coverage [email or web address] Password [email or web address]

## 2019-03-20 NOTE — H&P (Signed)
Physical Medicine and Rehabilitation Admission H&P       Chief Complaint  Patient presents with   Functional deficits due to fall with hip fracture    HPI: Oscar Baker is an 85 year old male with history of AF, bradycardia s/p PPM, T2DM, CLL,  Anemia who was admitted on 03/10/19 with fall backwards and struck his chest and midback. He was found to have minimally displaced right periprosthetic hip fracture and mild bruising on back with hematoma. Chronic coumadin allowed to drift down and bridged with heparin prior to surgery. Hospital course significant for rate controlled A flutter, leucocytosis, poorly controlled BS as well as limitations due to pain. He underwent ORIF right hip on 09/23 by Dr. Alvan Dame and to be [facility] for 6-8 weeks. Coumadin resumed and is therapeutic. Hospital course significant for ABLA with rise in WBC from 33-->43.4-->41 and hyponatremia  No fevers but BP has been soft. Therapy evaluations completed and CIR recommended due to functional decline in mobility and ability to complete ADLs.   Seen pt at bedside - wife is very clear she and patient both want to try to come to inpt rehab, even though he's low level and try to work up to 3 hours/day by doing 15/7. She also reports all or most of falls can be attributed to a fall/middle ear infection where he lost hearing and balance beginning of 2019- ever since then, falls frequently and needs a RW. Has poor balance.  Currently, reports having pain "whenever he moves" and wants something to "knock it and him out".  Has chronic aspiration and refuses Dysphagia diet. Also bowels haven't been working real well- straining severely to go.    Review of Systems  Constitutional: Negative for chills and fever.  HENT: Positive for hearing loss (deaf in right ). Negative for tinnitus.   Eyes: Negative for blurred vision.  Respiratory: Negative for shortness of breath.   Cardiovascular: Negative for chest pain and palpitations.   Gastrointestinal: Negative for constipation, heartburn and nausea.  Genitourinary: Negative for dysuria and urgency.       Wears depends at nights and condom cath during the day. --incontinent due to prostate cancer  Musculoskeletal: Positive for back pain (since the fall) and falls (wobbly--does not like any  help per wife).  Neurological: Positive for weakness. Negative for headaches.  Psychiatric/Behavioral: Positive for memory loss.         Past Medical History:  Diagnosis Date   Asthma 1950's   history of   Atrial fibrillation (HCC)    Bilateral carpal tunnel syndrome    Bilateral lower extremity edema    Bladder tumor    Chronic systolic heart failure ([place])    Echo 1/19: Mild LVH, EF 45-50, inf HK, MAC, severe LAE, severe RAE // Echo 7/15: Mild LVH, mod focal basal sept hypertrophy, EF 55-60, AV peak and mean 16/9, trivial MR, mod LAE, PASP 38   CLL (chronic lymphocytic leukemia) [facility]) oncologist-  dr Ilene Qua--   dx 01-2014 ;  Lymphocytosis, CLL - per lov note 05-11-2017 currently under active survillance,  CT 04-17-2014 show very small lymphadenopathy, no indication for treatment   Coronary artery disease    cardiologist-  dr Cathie Olden--  08-18-2017 Intermittant risk nuclear study w/ large area of inferior infartion with no evidence ishcemia    Deafness in right ear    Diabetes mellitus type 2, noninsulin dependent (HCC)    Elevated PSA    since prostatectomy but now resolved  Hematuria 04/2017   History of ear infection    Right   History of MI (myocardial infarction)    per myoview nuclear study 08-18-2017 , unknown when   History of shingles 08/2017   L ear and scalp, possible   Hyperlipidemia    Hypertension    Ischemic cardiomyopathy 09/01/2017   Presumed +CAD with Nuclear stress test 08/18/17 - Inferior scar, no ischemia, intermediate risk // med management unless +angina or worse dyspnea   OA (osteoarthritis)    Pacemaker  02/08/2014   followed by dr g. taylor--  single chamber Biotronik due to SSS   Permanent atrial fibrillation    Pneumonia 2019   Left lung   Prostate cancer [facility]) urologist-  dr Diona Fanti   dx 2004--  Gleason 8, PSA 10.45--  11-28-2002  s/p  radical prostatectomy;  recurrent w/ increasing PSA, started ADT treatment   RBBB (right bundle branch block)    Sick sinus syndrome ([place])    a-Flutter with episodes of bradycardia; S/P Biotronik (serial number [id number]) 02-08-2014   Urinary incontinence    Wears hearing aid in right ear    receiver and transmitter         Past Surgical History:  Procedure Laterality Date   APPENDECTOMY     BACK SURGERY     disk   CARDIAC CATHETERIZATION  09-03-1999  dr Cathie Olden   abnormal cardiolite study:  minor luminal irregularities but no critial coronary artery stenosis   CARDIOVASCULAR STRESS TEST  08-18-2017  dr Cathie Olden   Intermediate risk nuclear study w/ large area inferior infarction, no evidence of ishcemia (consistant w/ prior MI)/  study not gated due to frequent PVCs   CARPAL TUNNEL RELEASE Right 2000   CARPAL TUNNEL RELEASE Left 11/19/2009   CATARACT EXTRACTION Right 07/2015   CATARACT EXTRACTION Left 09/2015   CYSTOSCOPY N/A 11/04/2017   Procedure: CYSTOSCOPY AND CAUTERIZATION OF BLADDER;  Surgeon: Franchot Gallo, MD;  Location: [facility];  Service: Urology;  Laterality: N/A;   INSERTION PENILE PROSTHESIS  02-22-2004    dr r. Amalia Hailey  [facility]   IR THORACENTESIS ASP PLEURAL SPACE W/IMG GUIDE  11/26/2017   KNEE ARTHROSCOPY Left 07/2010   ORIF PERIPROSTHETIC FRACTURE Right 03/15/2019   Procedure: OPEN REDUCTION INTERNAL FIXATION (ORIF) PERIPROSTHETIC FRACTURE WITH ZIMMER CABLES;  Surgeon: Paralee Cancel, MD;  Location: [place];  Service: Orthopedics;  Laterality: Right;   PERMANENT PACEMAKER INSERTION N/A 02/08/2014   Procedure: PERMANENT PACEMAKER INSERTION;  Surgeon: Evans Lance, MD;   Location: [facility] CATH LAB;  Service: Cardiovascular;  Laterality: N/A;   PLEURAL EFFUSION DRAINAGE Left 11/30/2017   Procedure: DRAINAGE OF HEMOTHORAX;  Surgeon: Ivin Poot, MD;  Location: [place];  Service: Thoracic;  Laterality: Left;   RADICAL RETROPUBIC PROSTATECTOMY W/ BILATERAL PELVIC LYMPH NODE DISSECTION  11-28-2002   dr Mattie Marlin  [facility]   TONSILLECTOMY     TOTAL HIP ARTHROPLASTY Left 05/12/2016   Procedure: LEFT TOTAL HIP ARTHROPLASTY ANTERIOR APPROACH;  Surgeon: Paralee Cancel, MD;  Location: [facility];  Service: Orthopedics;  Laterality: Left;   TOTAL HIP ARTHROPLASTY Right 07-15-2006   dr Alvan Dame  [facility]   TRANSTHORACIC ECHOCARDIOGRAM  07/20/2017   mild LVH, ef 45-50%, hypokinesis of the basal-midinferior myocardium, due to AFib unable to evaluate diastolic function/  severe LAE and RAE/  trivial PR and TR   VIDEO ASSISTED THORACOSCOPY Left 11/30/2017   Procedure: VIDEO ASSISTED THORACOSCOPY;  Surgeon: Ivin Poot, MD;  Location: [place];  Service:  Thoracic;  Laterality: Left;   VIDEO ASSISTED THORACOSCOPY (VATS)/EMPYEMA Left 11/29/2017   Procedure: VIDEO ASSISTED THORACOSCOPY (VATS)/EMPYEMA;  Surgeon: Ivin Poot, MD;  Location: [facility] OR;  Service: Thoracic;  Laterality: Left;         Family History  Problem Relation Age of Onset   Emphysema Mother    Allergic rhinitis Mother    Heart attack Father    Allergic rhinitis Brother    Pulmonary fibrosis Brother     Social History:  reports that he quit smoking about 43 years ago. His smoking use included pipe and cigars. He quit after 25.00 years of use. He has never used smokeless tobacco. He reports current alcohol use. He reports that he does not use drugs.         Allergies  Allergen Reactions   Altace [Ramipril] Other (See Comments)    "throat felt like had a knot in it"   Augmentin [Amoxicillin-Pot Clavulanate] Diarrhea    severe   Codeine Nausea And Vomiting    Nausea and vomiting      Simvastatin Other (See Comments)    Leg aches          Medications Prior to Admission  Medication Sig Dispense Refill   atorvastatin (LIPITOR) 20 MG tablet TAKE ONE TABLET EACH DAY AT 6PM (Patient taking differently: Take 20 mg by mouth daily at 6 PM. ) 90 tablet 3   carvedilol (COREG) 3.125 MG tablet TAKE ONE TABLET TWICE DAILY (Patient taking differently: Take 3.125 mg by mouth 2 (two) times daily with a meal. ) 180 tablet 3   fenofibrate micronized (LOFIBRA) 200 MG capsule TAKE ONE CAPSULE EACH DAY BEFORE BREAKFAST (Patient taking differently: Take 200 mg by mouth daily before breakfast. ) 90 capsule 2   furosemide (LASIX) 20 MG tablet TAKE ONE TABLET DAILY AS NEEDED (Patient taking differently: Take 20 mg by mouth daily. ) 90 tablet 3   gabapentin (NEURONTIN) 300 MG capsule Take 1 capsule (300 mg total) by mouth 3 (three) times daily. 180 capsule 1   glipiZIDE (GLUCOTROL XL) 2.5 MG 24 hr tablet Take 2.5 mg by mouth daily with breakfast.     guaiFENesin (MUCINEX) 600 MG 12 hr tablet Take 600 mg by mouth 2 (two) times daily as needed for cough.      HYDROcodone-homatropine (HYCODAN) 5-1.5 MG/5ML syrup Take 5 mLs by mouth every 6 (six) hours as needed for cough. 240 mL 0   ipratropium (ATROVENT) 0.02 % nebulizer solution TAKE 2.30m BY NEBULIZER 3 TIMES DAILY (Patient taking differently: Take 0.5 mg by nebulization 3 (three) times daily. ) 675 mL 0   metFORMIN (GLUCOPHAGE) 1000 MG tablet Take 1,000 mg by mouth 2 (two) times daily with a meal.       Multiple Vitamin (MULTIVITAMIN WITH MINERALS) TABS tablet Take 1 tablet by mouth daily.     sitaGLIPtin (JANUVIA) 100 MG tablet Take 100 mg by mouth daily.     vitamin B-12 (CYANOCOBALAMIN) 1000 MCG tablet Take 1,000 mcg by mouth daily.      warfarin (COUMADIN) 5 MG tablet TAKE AS DIRECTED BY COUMADIN CLINIC (Patient taking differently: Take 5-7.5 mg by mouth See admin instructions. Take 1 and 1/2 tablets on Saturday  and Tuesday then take 1 tablet all the other days) 120 tablet 0   acetaminophen (TYLENOL) 500 MG tablet Take 2 tablets (1,000 mg total) by mouth every 6 (six) hours. (Patient not taking: Reported on 03/10/2019) 30 tablet 0   Nebulizers (VIOS LC  SPRINT DELUXE) MISC Use 1 each every 6 (six) hours as needed (shortness of breath or wheezing) Fax to [facility]     sodium chloride HYPERTONIC 3 % nebulizer solution Take by nebulization 3 (three) times daily. (Patient not taking: Reported on 01/30/2019) 360 mL 3    Drug Regimen Review  Drug regimen was reviewed and remains appropriate with no significant issues identified  Home: Home Living Family/patient expects to be discharged to:: Inpatient rehab Living Arrangements: Spouse/significant other Available Help at Discharge: Family, Available 24 hours/day Type of Home: Apartment Home Access: Level entry Home Layout: One level Bathroom Shower/Tub: Multimedia programmer: Handicapped height Bathroom Accessibility: No Home Equipment: Grab bars - toilet, Grab bars - tub/shower, Environmental consultant - 2 wheels Additional Comments: Pt is demonstrating poor control of RLE despite previous level of I   Functional History: Prior Function Level of Independence: Independent with assistive device(s) Comments: RW at home with pt being able to go out in community prev  Functional Status:  Mobility: Bed Mobility Overal bed mobility: Needs Assistance Bed Mobility: Supine to Sit Rolling: Mod assist Supine to sit: Mod assist Sit to supine: Max assist, +2 for physical assistance, +2 for safety/equipment General bed mobility comments: Pt required cues for hand placement and piillow between legs for comfort.  He reports he is unable to atttempt transfer OOB to commode at this time.  Pt extremely painful with rolling for bed pan placement. Transfers Overall transfer level: Needs assistance Equipment used: Rolling walker (2 wheeled) Transfers: Sit to/from  Stand Sit to Stand: Mod assist, +2 physical assistance, +2 safety/equipment, From elevated surface General transfer comment: Pt performing 4 sit to stand trial. PT providing verbal cues for increased forward trunk lean and rocking forward to gain momentum Ambulation/Gait Ambulation/Gait assistance: Min assist Gait Distance (Feet): 3 Feet Assistive device: Rolling walker (2 wheeled) Gait Pattern/deviations: Step-to pattern, Shuffle General Gait Details: gait was really steps to get to chair Gait velocity: reduced Gait velocity interpretation: <1.31 ft/sec, indicative of household ambulator    ADL: ADL Overall ADL's : Needs assistance/impaired Eating/Feeding: Set up, Sitting Grooming: Wash/dry hands, Wash/dry face, Sitting, Set up Upper Body Bathing: Minimal assistance, Sitting, Cueing for safety, Cueing for sequencing Lower Body Bathing: Maximal assistance, +2 for physical assistance, +2 for safety/equipment, Cueing for safety, Cueing for sequencing, Sit to/from stand Upper Body Dressing : Minimal assistance, Cueing for safety, Cueing for sequencing, Sitting Lower Body Dressing: Maximal assistance, +2 for physical assistance, +2 for safety/equipment, Cueing for safety, Cueing for sequencing, Sit to/from stand Toilet Transfer: Maximal assistance, +2 for physical assistance, +2 for safety/equipment Toileting- Clothing Manipulation and Hygiene: Maximal assistance, +2 for physical assistance, +2 for safety/equipment, Cueing for safety, Cueing for sequencing, Sit to/from stand Tub/ Shower Transfer: Maximal assistance, +2 for physical assistance, +2 for safety/equipment, Rolling walker Functional mobility during ADLs: Maximal assistance, Rolling walker, Cueing for safety, Cueing for sequencing General ADL Comments: cues on sequencing and WB status  Cognition: Cognition Overall Cognitive Status: Within Functional Limits for tasks assessed Orientation Level: Oriented  X4 Cognition Arousal/Alertness: Awake/alert Behavior During Therapy: WFL for tasks assessed/performed Overall Cognitive Status: Within Functional Limits for tasks assessed General Comments: able to discuss home but wife there to addi details   Blood pressure (!) 101/48, pulse 64, temperature 98.4 F (36.9 C), temperature source Oral, resp. rate 16, height 5' 5.98" (1.676 m), weight 92.5 kg, SpO2 93 %. Physical Exam  Nursing note and vitals reviewed. Constitutional: He is oriented to person, place, and time.  He appears well-developed and well-nourished.  HENT:  Head: Macrocephalic.  Healing abrasion on left cheek.   Respiratory: Effort normal.  congested cough noted.   constantly throughout interview- not using ICS per pt or wife. GI: He exhibits no distension. There is no abdominal tenderness.  Musculoskeletal:  Strength- 5/5 in UEs and B/L LLE HF 3/5, otherwise 4+/5 distally in KE/KF/DF and PF RLE- HF 1/5 due to pain; distally 4+/5 in KE/KF/DF and PF    Comments: Right flank with very large hematoma that's purple; a smaller bruise but larger hematoma on R lateral mid back with protrusion of skin by hematoma.  Right hip incision with dry dressing.   Neurological: He is alert and oriented to person, place, and time.  Skin: Skin is warm and dry.  wearing condom catheter   Lab Results Last 48 Hours        Results for orders placed or performed during the hospital encounter of 03/09/19 (from the past 48 hour(s))  Glucose, capillary     Status: Abnormal   Collection Time: 03/18/19 12:04 PM  Result Value Ref Range   Glucose-Capillary 175 (H) 70 - 99 mg/dL  Glucose, capillary     Status: Abnormal   Collection Time: 03/18/19  4:06 PM  Result Value Ref Range   Glucose-Capillary 227 (H) 70 - 99 mg/dL  Glucose, capillary     Status: Abnormal   Collection Time: 03/18/19  6:00 PM  Result Value Ref Range   Glucose-Capillary 210 (H) 70 - 99 mg/dL  Glucose, capillary      Status: Abnormal   Collection Time: 03/18/19 10:08 PM  Result Value Ref Range   Glucose-Capillary 256 (H) 70 - 99 mg/dL  Protime-INR     Status: Abnormal   Collection Time: 03/19/19  3:38 AM  Result Value Ref Range   Prothrombin Time 22.2 (H) 11.4 - 15.2 seconds   INR 2.0 (H) 0.8 - 1.2    Comment: (NOTE) INR goal varies based on device and disease states. Performed at [facility], [place] [address], [place], [name] [address]   CBC     Status: Abnormal   Collection Time: 03/19/19  3:38 AM  Result Value Ref Range   WBC 41.1 (H) 4.0 - 10.5 K/uL   RBC 2.57 (L) 4.22 - 5.81 MIL/uL   Hemoglobin 8.5 (L) 13.0 - 17.0 g/dL   HCT 25.6 (L) 39.0 - 52.0 %   MCV 99.6 80.0 - 100.0 fL   MCH 33.1 26.0 - 34.0 pg   MCHC 33.2 30.0 - 36.0 g/dL   RDW 19.4 (H) 11.5 - 15.5 %   Platelets 464 (H) 150 - 400 K/uL   nRBC 0.2 0.0 - 0.2 %    Comment: Performed at [place] [address], [place], [name] [address]  Glucose, capillary     Status: Abnormal   Collection Time: 03/19/19  6:59 AM  Result Value Ref Range   Glucose-Capillary 182 (H) 70 - 99 mg/dL  Glucose, capillary     Status: Abnormal   Collection Time: 03/19/19 11:56 AM  Result Value Ref Range   Glucose-Capillary 223 (H) 70 - 99 mg/dL  Glucose, capillary     Status: Abnormal   Collection Time: 03/19/19  4:34 PM  Result Value Ref Range   Glucose-Capillary 158 (H) 70 - 99 mg/dL  Glucose, capillary     Status: Abnormal   Collection Time: 03/19/19 10:06 PM  Result Value Ref Range   Glucose-Capillary 159 (  H) 70 - 99 mg/dL  Protime-INR     Status: Abnormal   Collection Time: 03/20/19  3:27 AM  Result Value Ref Range   Prothrombin Time 22.3 (H) 11.4 - 15.2 seconds   INR 2.0 (H) 0.8 - 1.2    Comment: (NOTE) INR goal varies based on device and disease states. Performed at [facility], [place] [address] [place], [name] [address]   Glucose, capillary     Status: Abnormal   Collection  Time: 03/20/19  6:36 AM  Result Value Ref Range   Glucose-Capillary 132 (H) 70 - 99 mg/dL     Imaging Results (Last 48 hours)  No results found.       Medical Problem List and Plan: 1.  R hip periprosthetic fracture s/p ORIF secondary to grund level fall at home due to poor balance.  -will need 15/7 for therapy due to poor movement. 2.  Antithrombotics: -DVT/anticoagulation:  Pharmaceutical: Coumadin             -antiplatelet therapy: on coumadin for Afib- INR 2.0 3. Pain Management: continue gabapentin tid for neuropathy and Norco 7/5/325 mg q4 hours prn for pain  4. Mood: LCSW to follow for evaluation and support.              -antipsychotic agents: N/A 5. Neuropsych: This patient is capable of making decisions on his own behalf. 6. Skin/Wound Care: Monitor wound for healing. Continue nutritional supplement to promote healing.   -did entire body skin check- has 2 large hematomas and a R hip incision- will need kinesiotaping for 1 of hematomas 7. Fluids/Electrolytes/Nutrition: Monitor I/O. Check lytes in am. 8. Chronic A FIB: Monitor HR tid--continue coreg. Monitor for orthostatic symptoms.  9. CLL: WBC trending back down- is normally in 20s.    10 ABLA: Continue to monitor H/H--stabilizing around 8. On iron supplement.  11. T2DM: On lantus 35 units daily with SSI for elevated BS. Monitor BS ac/hs and titrate lantus as indicated.  12. Hyponatremia: Will check follow up labs in am.  13. Chronic cough (due to chronic aspiration): per records has refused dysphagia diet. On duonebs tid. On Neurontin for cough? Keep HOB>30 degrees 14. Constipation due to opioids- will schedule Senokot S 2 tabs BID and use prns as necessary, 15. Poor balance  - will have vestibular PT evaluation done and see if can be helped at all.      Bary Leriche, PA-C 03/20/2019

## 2019-03-20 NOTE — Care Management Important Message (Signed)
Important Message  Patient Details  Name: Oscar Baker MRN: [id number] Date of Birth: [birth year]   Medicare Important Message Given:  Yes     Memory Argue 03/20/2019, 2:52 PM

## 2019-03-20 NOTE — Progress Notes (Signed)
[place] for Coumadin Indication: atrial fibrillation  Allergies  Allergen Reactions  . Altace [Ramipril] Other (See Comments)    "throat felt like had a knot in it"  . Augmentin [Amoxicillin-Pot Clavulanate] Diarrhea    severe  . Codeine Nausea And Vomiting    Nausea and vomiting   . Simvastatin Other (See Comments)    Leg aches    Patient Measurements: Height: 5' 5.98" (167.6 cm) Weight: 204 lb (92.5 kg) IBW/kg (Calculated) : 63.76  Vital Signs: Temp: 98.4 F (36.9 C) (09/28 0759) Temp Source: Oral (09/28 0759) BP: 101/48 (09/28 0759) Pulse Rate: 64 (09/28 0759)  Labs: Recent Labs    03/18/19 0344 03/19/19 0338 03/20/19 0327  HGB 9.0* 8.5*  --   HCT 27.0* 25.6*  --   PLT 463* 464*  --   LABPROT 21.4* 22.2* 22.3*  INR 1.9* 2.0* 2.0*    Estimated Creatinine Clearance: 63.7 mL/min (by C-G formula based on SCr of 0.92 mg/dL).   Assessment: Patient on Warfarin for atrial fibrillation. PTA Warfarin dosing: 5 mg days except 7.5 mg on Tues and Sat, last dose 9/17 PTA. Resumed Warfarin on 9/23 post THA surgery   INR therapeutic at 2.0 today. Hgb dropped to 8.5 9/27, Plt stable at 464, no CBC this morning . No bleeding noted.  Goal of Therapy:  INR 2-3 Monitor platelets by anticoagulation protocol: Yes   Plan:  - Warfarin 5 mg today x1 - Daily PT/INR   Thank you for the interesting consult and for involving pharmacy in this patient's care.  Gwenlyn Fudge - Student PharmD 03/20/2019 8:58 AM

## 2019-03-20 NOTE — PMR Pre-admission (Signed)
PMR Admission Coordinator Pre-Admission Assessment  Patient: Oscar Baker is an 85 y.o., male MRN: [id number] DOB: 1933-03-19 Height: 5' 5.98" (167.6 cm) Weight: 92.5 kg  Insurance Information HMO:     PPO:  PCP:  IPA:      80/20:      OTHER:  PRIMARY: Medicare A and B      Policy#: [id number]      Subscriber: patient CM Name:       Phone#:      Fax#:  Pre-Cert#: verified eligibility online      Employer:  Benefits:  Phone #:      Name:  Eff. Date: 07/24/1999 A and B     Deduct: $1408      Out of Pocket Max:       Life Max:  CIR:100%       SNF: 20 full days Outpatient:      Co-Pay:  Home Health:       Co-Pay:  DME:      Co-Pay:  Providers:  SECONDARY: BCBS Supplement      Policy#: [id number]      Subscriber:  CM Name:       Phone#:      Fax#:  Pre-Cert#:       Employer:  Benefits:  Phone #:      Name:  Eff. Date:      Deduct:       Out of Pocket Max:       Life Max:  CIR:       SNF:  Outpatient:      Co-Pay:  Home Health:       Co-Pay:  DME:      Co-Pay:   Medicaid Application Date:       Case Manager:  Disability Application Date:       Case Worker:   The "Data Collection Information Summary" for patients in Inpatient Rehabilitation Facilities with attached "Privacy Act [place] Records" was provided and verbally reviewed with: Patient and Family  Emergency Contact Information Contact Information    Name Relation Home Work [place] A Spouse [phone]      Current Medical History  Patient Admitting Diagnosis: R hip periprosthetic fracture  History of Present Illness: Oscar Baker is an 85 year old male with history of AF, bradycardia s/p PPM, T2DM, CLL,  and anemia who was admitted on 03/10/19 with fall backwards, pt twisted mid-fall and struck his chest and midback. He was found to have minimally displaced right periprosthetic hip fracture and mild bruising on back with hematoma. Chronic coumadin allowed to  drift down and bridged with heparin prior to surgery. Hospital course significant for rate controlled A flutter, leucocytosis, poorly controlled BS as well as limitations due to pain. He underwent ORIF right hip on 09/23 by Dr. Alvan Dame and to be [facility] for 6-8 weeks. Coumadin resumed and is therapeutic. Hospital course significant for ABLA with rise in WBC from 33-->43.4-->41 and hyponatremia.  No fevers, but BP has been soft. Therapy evaluations completed and CIR recommended.    Patient's medical record from [facility] has been reviewed by the rehabilitation admission coordinator and physician.  Past Medical History  Past Medical History:  Diagnosis Date  . Asthma 1950's   history of  . Atrial fibrillation ([place])   . Bilateral carpal tunnel syndrome   . Bilateral lower extremity edema   . Bladder tumor   . Chronic systolic heart failure ([place])  Echo 1/19: Mild LVH, EF 45-50, inf HK, MAC, severe LAE, severe RAE // Echo 7/15: Mild LVH, mod focal basal sept hypertrophy, EF 55-60, AV peak and mean 16/9, trivial MR, mod LAE, PASP 38  . CLL (chronic lymphocytic leukemia) [facility]) oncologist-  dr Ilene Qua--   dx [phone] ;  Lymphocytosis, CLL - per lov note 05-11-2017 currently under active survillance,  CT 04-17-2014 show very small lymphadenopathy, no indication for treatment  . Coronary artery disease    cardiologist-  dr Cathie Olden--  08-18-2017 Intermittant risk nuclear study w/ large area of inferior infartion with no evidence ishcemia   . 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 Diabetes mellitus type 2, noninsulin dependent ([place])   . Elevated PSA    since prostatectomy but now resolved  . Hematuria 04/2017  . History of ear infection    Right  . History of MI (myocardial infarction)    per myoview nuclear study 08-18-2017 , unknown when  . History of shingles 08/2017   L ear and scalp, possible  . Hyperlipidemia   . Hypertension   . Ischemic cardiomyopathy 09/01/2017   Presumed +CAD with Nuclear stress  test 08/18/17 - Inferior scar, no ischemia, intermediate risk // med management unless +angina or worse dyspnea  . OA (osteoarthritis)   . Pacemaker 02/08/2014   followed by dr g. taylor--  single chamber Biotronik due to SSS  . Permanent atrial fibrillation   . Pneumonia 2019   Left lung  . Prostate cancer [facility]) urologist-  dr Diona Fanti   dx 2004--  Gleason 8, PSA 10.45--  11-28-2002  s/p  radical prostatectomy;  recurrent w/ increasing PSA, started ADT treatment  . RBBB (right bundle branch block)   . Sick sinus syndrome ([place])    a-Flutter with episodes of bradycardia; S/P Biotronik (serial number [id number]) 02-08-2014  . Urinary incontinence   . Wears hearing aid in right ear    receiver and transmitter    Family History   family history includes Allergic rhinitis in his brother and mother; Emphysema in his mother; Heart attack in his father; Pulmonary fibrosis in his brother.  Prior Rehab/Hospitalizations Has the patient had prior rehab or hospitalizations prior to admission? No  Has the patient had major surgery during 100 days prior to admission? Yes   Current Medications  Current Facility-Administered Medications:  .  acetaminophen (TYLENOL) tablet 650 mg, 650 mg, Oral, Q6H PRN, Blount, Xenia T, NP, 650 mg at 03/18/19 2359 .  atorvastatin (LIPITOR) tablet 20 mg, 20 mg, Oral, q1800, Babish, Matthew, PA-C, 20 mg at 03/19/19 1723 .  carvedilol (COREG) tablet 3.125 mg, 3.125 mg, Oral, BID WC, Babish, Matthew, PA-C, 3.125 mg at 03/20/19 0820 .  Chlorhexidine Gluconate Cloth 2 % PADS 6 each, 6 each, Topical, Daily, Danae Orleans, PA-C, 6 each at 03/19/19 0911 .  docusate sodium (COLACE) capsule 100 mg, 100 mg, Oral, BID, Danae Orleans, PA-C, 100 mg at 03/20/19 1133 .  fenofibrate tablet 160 mg, 160 mg, Oral, Daily, Danae Orleans, PA-C, 160 mg at 03/20/19 1135 .  ferrous sulfate tablet 325 mg, 325 mg, Oral, TID PC, Danae Orleans, PA-C, 325 mg at 03/20/19 4970 .  gabapentin  (NEURONTIN) capsule 300 mg, 300 mg, Oral, TID, Babish, Matthew, PA-C, 300 mg at 03/20/19 1133 .  guaiFENesin (MUCINEX) 12 hr tablet 600 mg, 600 mg, Oral, BID PRN, Danae Orleans, PA-C, 600 mg at 03/18/19 0904 .  HYDROcodone-acetaminophen (NORCO) 7.5-325 MG per tablet 1 tablet, 1 tablet, Oral, Q6H PRN, Adhikari,  Amrit, MD, 1 tablet at 03/19/19 2207 .  insulin aspart (novoLOG) injection 0-9 Units, 0-9 Units, Subcutaneous, TID WC, Babish, Rodman Key, PA-C, 1 Units at 03/20/19 4696 .  insulin glargine (LANTUS) injection 40 Units, 40 Units, Subcutaneous, Daily, Shelly Coss, MD, 40 Units at 03/20/19 1134 .  ipratropium-albuterol (DUONEB) 0.5-2.5 (3) MG/3ML nebulizer solution 3 mL, 3 mL, Nebulization, TID, Adhikari, Amrit, MD, 3 mL at 03/20/19 0805 .  lactated ringers infusion, , Intravenous, Continuous, Norman Herrlich, Stopped at 03/16/19 1523 .  lactated ringers infusion, , Intravenous, Continuous, Norman Herrlich, Stopped at 03/16/19 1523 .  menthol-cetylpyridinium (CEPACOL) lozenge 3 mg, 1 lozenge, Oral, PRN **OR** phenol (CHLORASEPTIC) mouth spray 1 spray, 1 spray, Mouth/Throat, PRN, Babish, Matthew, PA-C .  methocarbamol (ROBAXIN) tablet 500 mg, 500 mg, Oral, Q6H PRN, 500 mg at 03/18/19 2359 **OR** methocarbamol (ROBAXIN) 500 mg in dextrose 5 % 50 mL IVPB, 500 mg, Intravenous, Q6H PRN, Babish, Matthew, PA-C .  metoCLOPramide (REGLAN) tablet 5-10 mg, 5-10 mg, Oral, Q8H PRN **OR** metoCLOPramide (REGLAN) injection 5-10 mg, 5-10 mg, Intravenous, Q8H PRN, Babish, Matthew, PA-C .  morphine 2 MG/ML injection 1 mg, 1 mg, Intravenous, Q2H PRN, Babish, Matthew, PA-C, 1 mg at 03/16/19 2107 .  multivitamin with minerals tablet 1 tablet, 1 tablet, Oral, Daily, Danae Orleans, PA-C, 1 tablet at 03/20/19 1133 .  mupirocin ointment (BACTROBAN) 2 %, , Nasal, BID, Babish, Matthew, PA-C .  ondansetron (ZOFRAN) tablet 4 mg, 4 mg, Oral, Q6H PRN **OR** ondansetron (ZOFRAN) injection 4 mg, 4 mg, Intravenous,  Q6H PRN, Babish, Matthew, PA-C .  polyethylene glycol (MIRALAX / GLYCOLAX) packet 17 g, 17 g, Oral, Daily, Adhikari, Amrit, MD, 17 g at 03/20/19 1134 .  senna-docusate (Senokot-S) tablet 2 tablet, 2 tablet, Oral, BID, Shelly Coss, MD, 2 tablet at 03/20/19 1131 .  sodium phosphate (FLEET) 7-19 GM/118ML enema 1 enema, 1 enema, Rectal, Once, Shelly Coss, MD, Stopped at 03/19/19 1200 .  vitamin B-12 (CYANOCOBALAMIN) tablet 1,000 mcg, 1,000 mcg, Oral, Daily, Babish, Matthew, PA-C, 1,000 mcg at 03/20/19 1133 .  warfarin (COUMADIN) tablet 5 mg, 5 mg, Oral, ONCE-1800, Shelly Coss, MD .  Warfarin - Pharmacist Dosing Inpatient, , Does not apply, q1800, Karren Cobble, RPH  Patients Current Diet:  Diet Order            Diet - low sodium heart healthy        Diet Carb Modified Fluid consistency: Thin; Room service appropriate? Yes  Diet effective now              Precautions / Restrictions Precautions Precautions: Fall Precaution Comments: premedicate Restrictions Weight Bearing Restrictions: (P) Yes RLE Weight Bearing: (P) Partial weight bearing RLE Partial Weight Bearing Percentage or Pounds: (P) 50 Other Position/Activity Restrictions: TDWB but should be NWB   Has the patient had 2 or more falls or a fall with injury in the past year? Yes  Prior Activity Level Limited Community (1-2x/wk): using RW at home. Did go out occasionally for errands and appointments  Prior Functional Level Self Care: Did the patient need help bathing, dressing, using the toilet or eating? Independent  Indoor Mobility: Did the patient need assistance with walking from room to room (with or without device)? Independent  Stairs: Did the patient need assistance with internal or external stairs (with or without device)? Independent  Functional Cognition: Did the patient need help planning regular tasks such as shopping or remembering to take medications? Needed some help  Home Assistive  Devices /  Equipment Home Assistive Devices/Equipment: Gilford Rile (specify type), Hearing aid Home Equipment: Grab bars - toilet, Grab bars - tub/shower, Walker - 2 wheels  Prior Device Use: Indicate devices/aids used by the patient prior to current illness, exacerbation or injury? Walker  Current Functional Level Cognition  Overall Cognitive Status: Within Functional Limits for tasks assessed Orientation Level: (P) Oriented X4 General Comments: able to discuss home but wife there to addi details    Extremity Assessment (includes Sensation/Coordination)  Upper Extremity Assessment: Generalized weakness LUE Deficits / Details: shoulder weakness  Lower Extremity Assessment: Defer to PT evaluation RLE Deficits / Details: new ORIF RLE: Unable to fully assess due to pain, Unable to fully assess due to immobilization RLE Sensation: decreased proprioception RLE Coordination: decreased fine motor, decreased gross motor    ADLs  Overall ADL's : Needs assistance/impaired Eating/Feeding: Set up, Sitting Grooming: Wash/dry hands, Wash/dry face, Sitting, Set up Upper Body Bathing: Minimal assistance, Sitting, Cueing for safety, Cueing for sequencing Lower Body Bathing: Maximal assistance, +2 for physical assistance, +2 for safety/equipment, Cueing for safety, Cueing for sequencing, Sit to/from stand Upper Body Dressing : Minimal assistance, Cueing for safety, Cueing for sequencing, Sitting Lower Body Dressing: Maximal assistance, +2 for physical assistance, +2 for safety/equipment, Cueing for safety, Cueing for sequencing, Sit to/from stand Toilet Transfer: Maximal assistance, +2 for physical assistance, +2 for safety/equipment Toileting- Clothing Manipulation and Hygiene: Maximal assistance, +2 for physical assistance, +2 for safety/equipment, Cueing for safety, Cueing for sequencing, Sit to/from stand Tub/ Shower Transfer: Maximal assistance, +2 for physical assistance, +2 for safety/equipment,  Rolling walker Functional mobility during ADLs: Maximal assistance, Rolling walker, Cueing for safety, Cueing for sequencing General ADL Comments: cues on sequencing and WB status    Mobility  Overal bed mobility: Needs Assistance Bed Mobility: Supine to Sit Rolling: Mod assist Supine to sit: Mod assist Sit to supine: Max assist, +2 for physical assistance, +2 for safety/equipment General bed mobility comments: Pt required cues for hand placement and piillow between legs for comfort.  He reports he is unable to atttempt transfer OOB to commode at this time.  Pt extremely painful with rolling for bed pan placement.    Transfers  Overall transfer level: Needs assistance Equipment used: Rolling walker (2 wheeled) Transfers: Sit to/from Stand Sit to Stand: Mod assist, +2 physical assistance, +2 safety/equipment, From elevated surface General transfer comment: Pt performing 4 sit to stand trial. PT providing verbal cues for increased forward trunk lean and rocking forward to gain momentum    Ambulation / Gait / Stairs / Wheelchair Mobility  Ambulation/Gait Ambulation/Gait assistance: Herbalist (Feet): 3 Feet Assistive device: Rolling walker (2 wheeled) Gait Pattern/deviations: Step-to pattern, Shuffle General Gait Details: gait was really steps to get to chair Gait velocity: reduced Gait velocity interpretation: <1.31 ft/sec, indicative of household ambulator    Posture / Balance Balance Overall balance assessment: Needs assistance Sitting-balance support: Bilateral upper extremity supported, Feet supported Sitting balance-Leahy Scale: Fair Standing balance support: Bilateral upper extremity supported Standing balance-Leahy Scale: Poor    Special needs/care consideration BiPAP/CPAP no CPM no Continuous Drip IV no Dialysis no        Days n/a Life Vest no Oxygen no Special Bed no Trach Size no Wound Vac (area) no      Location n/a Skin ecchymosis to UE, back, and  face, skin tear to RUE, MASD to sacrum  Bowel mgmt: continent, last BM 9/27 Bladder mgmt: incontinent Diabetic mgmt: yes Behavioral consideration no Chemo/radiation no   Previous Home Environment (from acute therapy documentation) Living Arrangements: Spouse/significant other Available Help at Discharge: Family, Available 24 hours/day Type of Home: Apartment Home Layout: One level Home Access: Level entry Bathroom Shower/Tub: Multimedia programmer: Handicapped height Bathroom Accessibility: No Home Care Services: No Additional Comments: Pt is demonstrating poor control of RLE despite previous level of I  Discharge Living Setting Plans for Discharge Living Setting: Patient's home Type of Home at Discharge: Apartment Discharge Home Layout: One level Discharge Home Access: Level entry Discharge Bathroom Shower/Tub: Walk-in shower Discharge Bathroom Toilet: Handicapped height Discharge Bathroom Accessibility: Yes How Accessible: Accessible via walker Does the patient have any problems obtaining your medications?: No  Social/Family/Support Systems Patient Roles: Spouse Anticipated Caregiver: wife, Opal Sidles Anticipated Caregiver's Contact Information: [phone] Ability/Limitations of Caregiver: supervision only Caregiver Availability: 24/7 Discharge Plan Discussed with Primary Caregiver: Yes Is Caregiver In Agreement with Plan?: Yes Does Caregiver/Family have Issues with Lodging/Transportation while Pt is in Rehab?: No  Goals/Additional Needs Patient/Family Goal for Rehab: PT/OT supervision Expected length of stay: 12-16 days Dietary Needs: carb modified/thin Additional Information: pt very HOH, hears better out of his L ear Pt/Family Agrees to Admission and willing to participate: Yes Program Orientation Provided & Reviewed with Pt/Caregiver Including Roles  & Responsibilities: Yes  Decrease burden of Care through IP rehab admission:  n/a  Possible need for SNF placement upon discharge: not anticipated  Patient Condition: I have reviewed medical records from [facility], spoken with CSW, and patient and spouse. I met with patient at the bedside for inpatient rehabilitation assessment.  Patient will benefit from ongoing PT and OT, can actively participate in 3 hours of therapy a day 5 days of the week, and can make measurable gains during the admission.  Patient will also benefit from the coordinated team approach during an Inpatient Acute Rehabilitation admission.  The patient will receive intensive therapy as well as Rehabilitation physician, nursing, social worker, and care management interventions.  Due to bladder management, safety, skin/wound care, disease management, medication administration, pain management and patient education the patient requires 24 hour a day rehabilitation nursing.  The patient is currently min to mod with mobility and basic ADLs.  Discharge setting and therapy post discharge at home with home health is anticipated.  Patient has agreed to participate in the Acute Inpatient Rehabilitation Program and will admit today.  Preadmission Screen Completed By:  Michel Santee, PT, DPT 03/20/2019 12:00 PM ______________________________________________________________________   Discussed status with Dr. Dagoberto Ligas on 03/20/19  at 12:05 PM  and received approval for admission today.  Admission Coordinator:  Michel Santee, PT, DPT time 12:05 PM Sudie Grumbling 03/20/19    Assessment/Plan: Diagnosis: 1. Does the need for close, 24 hr/day Medical supervision in concert with the patient's rehab needs make it unreasonable for this patient to be served in a less intensive setting? Yes 2. Co-Morbidities requiring supervision/potential complications: CLL with leukocytosis, A Fib on coumadin needing titration, uncontrolled BGs with DM, bradycardia and A Flutter- OK rate. 3. Due to bladder management, bowel management,  safety, skin/wound care, medication administration, pain management and patient education, does the patient require 24 hr/day rehab nursing? Yes 4. Does the patient require coordinated care of a physician, rehab nurse, PT (1-2 hrs/day, 5 days/week) and OT (1-2 hrs/day, 5 days/week) to address physical and functional deficits in the context of the above medical diagnosis(es)? Yes Addressing  deficits in the following areas: balance, endurance, locomotion, strength, bathing, dressing, grooming and toileting 5. Can the patient actively participate in an intensive therapy program of at least 3 hrs of therapy 5 days a week? Yes 6. The potential for patient to make measurable gains while on inpatient rehab is good 7. Anticipated functional outcomes upon discharge from inpatients are: supervision PT, modified independent OT, n/a SLP 8. Estimated rehab length of stay to reach the above functional goals is: 12-16 days 9. Anticipated D/C setting: Home 10. Anticipated post D/C treatments: [place] therapy 11. Overall Rehab/Functional Prognosis: good  MD Signature:

## 2019-03-20 NOTE — Progress Notes (Signed)
Inpatient Rehab Admissions Coordinator:   I have approval from Dr. Tawanna Solo and a bed available for pt to admit to CIR today.  I will let pt/family, RN, and CM know.    Shann Medal, PT, DPT Admissions Coordinator [phone] 03/20/19  12:10 PM

## 2019-03-20 NOTE — Progress Notes (Signed)
Patient ID: Oscar Baker, male   DOB: Sep 27, 1932, 85 y.o.   MRN: [id number] Patient was admitted to 4M08  via bed escorted by nursing staff and spouse.  Patient verbalized understanding of rehab process including fall prevention policy, personal belongings policy, and visitation policy.  Patient appears to be in no immediate distress at this time.  Brita Romp, RN

## 2019-03-21 ENCOUNTER — Inpatient Hospital Stay (HOSPITAL_COMMUNITY): Payer: Medicare Other

## 2019-03-21 ENCOUNTER — Other Ambulatory Visit: Payer: Self-pay

## 2019-03-21 ENCOUNTER — Inpatient Hospital Stay (HOSPITAL_COMMUNITY): Payer: Medicare Other | Admitting: Occupational Therapy

## 2019-03-21 DIAGNOSIS — E119 Type 2 diabetes mellitus without complications: Secondary | ICD-10-CM

## 2019-03-21 LAB — COMPREHENSIVE METABOLIC PANEL
ALT: 14 U/L (ref 0–44)
AST: 25 U/L (ref 15–41)
Albumin: 2.5 g/dL — ABNORMAL LOW (ref 3.5–5.0)
Alkaline Phosphatase: 64 U/L (ref 38–126)
Anion gap: 7 (ref 5–15)
BUN: 21 mg/dL (ref 8–23)
CO2: 27 mmol/L (ref 22–32)
Calcium: 8.4 mg/dL — ABNORMAL LOW (ref 8.9–10.3)
Chloride: 101 mmol/L (ref 98–111)
Creatinine, Ser: 0.94 mg/dL (ref 0.61–1.24)
GFR calc Af Amer: >60 mL/min (ref >60)
GFR calc non Af Amer: >60 mL/min (ref >60)
Glucose, Bld: 137 mg/dL — ABNORMAL HIGH (ref 70–99)
Potassium: 4.4 mmol/L (ref 3.5–5.1)
Sodium: 135 mmol/L (ref 135–145)
Total Bilirubin: 1.2 mg/dL (ref 0.3–1.2)
Total Protein: 5.3 g/dL — ABNORMAL LOW (ref 6.5–8.1)

## 2019-03-21 LAB — CBC WITH DIFFERENTIAL/PLATELET
Abs Immature Granulocytes: 0.56 10*3/uL — ABNORMAL HIGH (ref 0.00–0.07)
Basophils Absolute: 0.1 10*3/uL (ref 0.0–0.1)
Basophils Relative: 0 %
Eosinophils Absolute: 0.4 10*3/uL (ref 0.0–0.5)
Eosinophils Relative: 1 %
HCT: 28.3 % — ABNORMAL LOW (ref 39.0–52.0)
Hemoglobin: 9.4 g/dL — ABNORMAL LOW (ref 13.0–17.0)
Immature Granulocytes: 1 %
Lymphocytes Relative: 76 %
Lymphs Abs: 31.7 10*3/uL — ABNORMAL HIGH (ref 0.7–4.0)
MCH: 33.3 pg (ref 26.0–34.0)
MCHC: 33.2 g/dL (ref 30.0–36.0)
MCV: 100.4 fL — ABNORMAL HIGH (ref 80.0–100.0)
Monocytes Absolute: 3.3 10*3/uL — ABNORMAL HIGH (ref 0.1–1.0)
Monocytes Relative: 8 %
Neutro Abs: 5.7 10*3/uL (ref 1.7–7.7)
Neutrophils Relative %: 14 %
Platelets: UNDETERMINED 10*3/uL (ref 150–400)
RBC: 2.82 MIL/uL — ABNORMAL LOW (ref 4.22–5.81)
RDW: 19.5 % — ABNORMAL HIGH (ref 11.5–15.5)
WBC: 41.9 10*3/uL — ABNORMAL HIGH (ref 4.0–10.5)
nRBC: 0.1 % (ref 0.0–0.2)

## 2019-03-21 LAB — PROTIME-INR
INR: 2 — ABNORMAL HIGH (ref 0.8–1.2)
Prothrombin Time: 22.4 seconds — ABNORMAL HIGH (ref 11.4–15.2)

## 2019-03-21 LAB — GLUCOSE, CAPILLARY
Glucose-Capillary: 125 mg/dL — ABNORMAL HIGH (ref 70–99)
Glucose-Capillary: 201 mg/dL — ABNORMAL HIGH (ref 70–99)
Glucose-Capillary: 168 mg/dL — ABNORMAL HIGH (ref 70–99)
Glucose-Capillary: 90 mg/dL (ref 70–99)

## 2019-03-21 MED ORDER — WARFARIN SODIUM 7.5 MG PO TABS
7.5000 mg | ORAL_TABLET | Freq: Once | ORAL | Status: AC
  Administered 2019-03-21: 17:00:00 7.5 mg via ORAL
  Filled 2019-03-21: qty 1

## 2019-03-21 NOTE — Evaluation (Signed)
Occupational Therapy Assessment and Plan  Patient Details  Name: Oscar Baker MRN: [id number] Date of Birth: 02/05/1933  OT Diagnosis: acute pain, muscle weakness (generalized) and pain in joint Rehab Potential: Rehab Potential (ACUTE ONLY): Good ELOS: 2.5-3 weeks   Today's Date: 03/21/2019 OT Individual Time: 1020-1130 OT Individual Time Calculation (min): 70 min     Problem List:  Patient Active Problem List   Diagnosis Date Noted  . Periprosthetic fracture around internal prosthetic right hip joint (HCC) 03/20/2019  . Closed right hip fracture, initial encounter (HCC) 03/10/2019  . Chronic bronchitis with productive mucopurulent cough (HCC) 08/22/2018  . Acute systolic heart failure (HCC) 08/02/2018  . Type 2 diabetes mellitus (HCC) 08/02/2018  . Pain in joint of right shoulder 06/08/2018  . CAD (coronary artery disease) 05/21/2018  . Asthma exacerbation 05/21/2018  . Aspiration pneumonia (HCC) 05/21/2018  . Elevated INR   . Shoulder strain, right, initial encounter   . Acute respiratory failure with hypoxia (HCC) 05/20/2018  . Bilateral impacted cerumen 02/14/2018  . Asymmetric SNHL (sensorineural hearing loss) 02/14/2018  . Encounter for therapeutic drug monitoring 12/20/2017  . S/P thoracotomy 11/29/2017  . Sepsis (HCC)   . Hypoxemia   . Pneumonia of left lower lobe due to infectious organism (HCC)   . Mediastinal lymphadenopathy   . Chronic cough 11/23/2017  . CLL (chronic lymphocytic leukemia) (HCC) 11/23/2017  . Pleural effusion 11/23/2017  . HCAP (healthcare-associated pneumonia) 11/20/2017  . Anemia 11/20/2017  . Mixed conductive and sensorineural hearing loss of right ear with restricted hearing of left ear 09/14/2017  . Abnormal hearing test 09/06/2017  . Cellulitis 09/06/2017  . Lymphadenitis, acute 09/06/2017  . Ischemic cardiomyopathy 09/01/2017  . S/p bilateral revision of total hip arthroplasty 08/11/2017  . History of revision of total replacement of  left hip joint 08/11/2017  . Right hip pain   . Acute otitis media   . Chronic systolic CHF (congestive heart failure) (HCC)   . Pressure injury of skin 07/20/2017  . Pressure ulcer 07/20/2017  . Malignant otitis externa 07/19/2017  . Fall in home 07/19/2017  . Acute on chronic diastolic heart failure (HCC) 07/19/2017  . S/P left THA, AA 05/12/2016  . History of repair of hip joint 05/12/2016  . Laryngopharyngeal reflux 05/21/2015  . Allergic rhinitis 05/21/2015  . Obesity 12/12/2014  . Upper airway cough syndrome 12/11/2014  . Posterior rhinorrhea 12/11/2014  . Cough variant asthma 07/27/2014  . Wheezing 06/20/2014  . Symptomatic bradycardia - s/p Biotronik (serial number [id number]) 02/08/2014  . Bradycardia 02/08/2014  . Diabetes mellitus type 2, noninsulin dependent (HCC)   . Mixed incontinence 03/09/2013  . Edema of foot 03/06/2013  . Traumatic ecchymosis of right foot 03/06/2013  . Contusion of foot 03/06/2013  . Malignant neoplasm of prostate (HCC) 02/15/2013  . Diabetes mellitus without complication (HCC) 02/15/2013  . Bone spur 12/30/2012  . Pain of toe of left foot 12/30/2012  . Hyperlipidemia 07/13/2012  . Hypertensive disorder 04/12/2012  . Sick sinus syndrome (HCC) 08/17/2011  . PAF (paroxysmal atrial fibrillation) (HCC) 09/16/2010    Past Medical History:  Past Medical History:  Diagnosis Date  . Asthma 1950's   history of  . Atrial fibrillation (HCC)   . Bilateral carpal tunnel syndrome   . Bilateral lower extremity edema   . Bladder tumor   . Chronic systolic heart failure (HCC)    Echo 1/19: Mild LVH, EF 45-50, inf HK, MAC, severe LAE, severe RAE // Echo 7/15: Mild LVH,   mod focal basal sept hypertrophy, EF 55-60, AV peak and mean 16/9, trivial MR, mod LAE, PASP 38  . CLL (chronic lymphocytic leukemia) (HCC) oncologist-  dr shaded--   dx 01-2014 ;  Lymphocytosis, CLL - per lov note 05-11-2017 currently under active survillance,  CT 04-17-2014 show very  small lymphadenopathy, no indication for treatment  . Coronary artery disease    cardiologist-  dr nasher--  08-18-2017 Intermittant risk nuclear study w/ large area of inferior infartion with no evidence ishcemia   . 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 Diabetes mellitus type 2, noninsulin dependent (HCC)   . Elevated PSA    since prostatectomy but now resolved  . Hematuria 04/2017  . History of ear infection    Right  . History of MI (myocardial infarction)    per myoview nuclear study 08-18-2017 , unknown when  . History of shingles 08/2017   L ear and scalp, possible  . Hyperlipidemia   . Hypertension   . Ischemic cardiomyopathy 09/01/2017   Presumed +CAD with Nuclear stress test 08/18/17 - Inferior scar, no ischemia, intermediate risk // med management unless +angina or worse dyspnea  . OA (osteoarthritis)   . Pacemaker 02/08/2014   followed by dr g. taylor--  single chamber Biotronik due to SSS  . Permanent atrial fibrillation   . Pneumonia 2019   Left lung  . Prostate cancer (HCC) urologist-  dr dahlstedt   dx 2004--  Gleason 8, PSA 10.45--  11-28-2002  s/p  radical prostatectomy;  recurrent w/ increasing PSA, started ADT treatment  . RBBB (right bundle branch block)   . Sick sinus syndrome (HCC)    a-Flutter with episodes of bradycardia; S/P Biotronik (serial number [id number]) 02-08-2014  . Urinary incontinence   . Wears hearing aid in right ear    receiver and transmitter   Past Surgical History:  Past Surgical History:  Procedure Laterality Date  . APPENDECTOMY    . BACK SURGERY     disk  . CARDIAC CATHETERIZATION  09-03-1999  dr nasher   abnormal cardiolite study:  minor luminal irregularities but no critial coronary artery stenosis  . CARDIOVASCULAR STRESS TEST  08-18-2017  dr nasher   Intermediate risk nuclear study w/ large area inferior infarction, no evidence of ishcemia (consistant w/ prior MI)/  study not gated due to frequent PVCs  . CARPAL TUNNEL RELEASE Right 2000  .  CARPAL TUNNEL RELEASE Left 11/19/2009  . CATARACT EXTRACTION Right 07/2015  . CATARACT EXTRACTION Left 09/2015  . CYSTOSCOPY N/A 11/04/2017   Procedure: CYSTOSCOPY AND CAUTERIZATION OF BLADDER;  Surgeon: Dahlstedt, Stephen, MD;  Location: [facility];  Service: Urology;  Laterality: N/A;  . INSERTION PENILE PROSTHESIS  02-22-2004    dr r. evans  [facility]  . IR THORACENTESIS ASP PLEURAL SPACE W/IMG GUIDE  11/26/2017  . KNEE ARTHROSCOPY Left 07/2010  . ORIF PERIPROSTHETIC FRACTURE Right 03/15/2019   Procedure: OPEN REDUCTION INTERNAL FIXATION (ORIF) PERIPROSTHETIC FRACTURE WITH ZIMMER CABLES;  Surgeon: Olin, Matthew, MD;  Location: [facility] OR;  Service: Orthopedics;  Laterality: Right;  . PERMANENT PACEMAKER INSERTION N/A 02/08/2014   Procedure: PERMANENT PACEMAKER INSERTION;  Surgeon: Gregg W Taylor, MD;  Location: [facility];  Service: Cardiovascular;  Laterality: N/A;  . PLEURAL EFFUSION DRAINAGE Left 11/30/2017   Procedure: DRAINAGE OF HEMOTHORAX;  Surgeon: Van Trigt, Peter, MD;  Location: [facility] OR;  Service: Thoracic;  Laterality: Left;  . RADICAL RETROPUBIC PROSTATECTOMY W/ BILATERAL PELVIC LYMPH NODE DISSECTION  11-28-2002     dr Mattie Marlin  [facility]  . TONSILLECTOMY    . TOTAL HIP ARTHROPLASTY Left 05/12/2016   Procedure: LEFT TOTAL HIP ARTHROPLASTY ANTERIOR APPROACH;  Surgeon: Paralee Cancel, MD;  Location: [facility] ORS;  Service: Orthopedics;  Laterality: Left;  . TOTAL HIP ARTHROPLASTY Right 07-15-2006   dr Alvan Dame  [facility]  . TRANSTHORACIC ECHOCARDIOGRAM  07/20/2017   mild LVH, ef 45-50%, hypokinesis of the basal-midinferior myocardium, due to AFib unable to evaluate diastolic function/  severe LAE and RAE/  trivial PR and TR  . VIDEO ASSISTED THORACOSCOPY Left 11/30/2017   Procedure: VIDEO ASSISTED THORACOSCOPY;  Surgeon: Ivin Poot, MD;  Location: [facility];  Service: Thoracic;  Laterality: Left;  [name] VIDEO ASSISTED THORACOSCOPY (VATS)/EMPYEMA Left 11/29/2017   Procedure: VIDEO ASSISTED THORACOSCOPY  (VATS)/EMPYEMA;  Surgeon: Ivin Poot, MD;  Location: [facility] OR;  Service: Thoracic;  Laterality: Left;    Assessment & Plan Clinical Impression: Patient is a 85 y.o. year old male with history of AF, bradycardia s/p PPM, T2DM, CLL, Anemia who was admitted on 03/10/19 with fall backwards and struck his chest and midback. He was found to have minimally displaced right periprosthetic hip fracture and mild bruising on back with hematoma. Chronic coumadin allowed to drift down and bridged with heparin prior to surgery. Hospital course significant for rate controlled A flutter, leucocytosis, poorly controlled BS as well as limitations due to pain. He underwent ORIF right hip on 09/23 by Dr. Alvan Dame and to be [facility] for 6-8 weeks. Coumadin resumed and is therapeutic. Hospital course significant for ABLA with rise in WBC from 33-->43.4-->41 and hyponatremia No fevers but BP has been soft. Therapy evaluations completed and CIR recommended due to functional decline in mobility and ability to complete ADLs.  Patient transferred to CIR on 03/20/2019 .    Patient currently requires Max assist +2 for any standing with basic self-care skills secondary to muscle weakness and decreased standing balance, decreased balance strategies, difficulty maintaining precautions and pain.  Prior to hospitalization, patient could complete ADLs with modified independent .  Patient will benefit from skilled intervention to decrease level of assist with basic self-care skills prior to discharge home with care partner.  Anticipate patient will require 24 hour supervision and follow up home health.  OT - End of Session Activity Tolerance: Tolerates 30+ min activity with multiple rests Endurance Deficit: Yes Endurance Deficit Description: pain limiting participation OT Assessment Rehab Potential (ACUTE ONLY): Good OT Patient demonstrates impairments in the following area(s): Balance;Cognition;Endurance;Motor;Pain;Safety OT Basic ADL's  Functional Problem(s): Grooming;Bathing;Toileting;Dressing OT Transfers Functional Problem(s): Toilet;Tub/Shower OT Additional Impairment(s): None OT Plan OT Intensity: Minimum of 1-2 x/day, 45 to 90 minutes OT Frequency: Total of 15 hours over 7 days of combined therapies OT Duration/Estimated Length of Stay: 2.5-3 weeks OT Treatment/Interventions: Balance/vestibular training;Community reintegration;Cognitive remediation/compensation;Discharge planning;Disease mangement/prevention;DME/adaptive equipment instruction;Functional mobility training;Pain management;Patient/family education;Psychosocial support;Self Care/advanced ADL retraining;Therapeutic Activities;Therapeutic Exercise;UE/LE Strength taining/ROM OT Basic Self-Care Anticipated Outcome(s): Supervision OT Toileting Anticipated Outcome(s): Supervision OT Bathroom Transfers Anticipated Outcome(s): Supervision OT Recommendation Patient destination: Home Follow Up Recommendations: Home health OT Equipment Recommended: Tub/shower seat   Skilled Therapeutic Intervention OT eval completed with discussion of rehab process, OT purpose, POC, ELOS, and goals.  ADL assessment completed with bathing and dressing at sink.  Limited sit> stand due to pain in RLE with any movement or attempts at sit > stand.  Able to complete partial sit > stand with Max assist +2 with 2nd person pulling pants over hips from behind while therapist assisted with  weight shift and upright posture in front.  Pt with limited participation during eval, requiring max encouragement from therapist and wife to attempt tasks.  Pt remained upright in w/c with all needs in reach and seat belt alarm on.  OT Evaluation Precautions/Restrictions  Precautions Precautions: Fall Restrictions Weight Bearing Restrictions: Yes RLE Weight Bearing: Partial weight bearing RLE Partial Weight Bearing Percentage or Pounds: 50 General   Vital Signs  Pain Pain Assessment Pain Scale:  0-10 Pain Score: 8  Pain Type: Surgical pain Pain Location: Hip Pain Orientation: Right Pain Descriptors / Indicators: Burning;Spasm Pain Intervention(s): RN made aware;Repositioned Home Living/Prior Functioning Home Living Family/patient expects to be discharged to:: Private residence Living Arrangements: Spouse/significant other Available Help at Discharge: Available 24 hours/day, Family Type of Home: Apartment Home Access: Level entry Home Layout: One level Bathroom Shower/Tub: Walk-in shower Bathroom Toilet: Handicapped height Bathroom Accessibility: Yes  Lives With: Spouse IADL History Homemaking Responsibilities: Yes Meal Prep Responsibility: No Laundry Responsibility: No Cleaning Responsibility: No Bill Paying/Finance Responsibility: Primary Shopping Responsibility: No Child Care Responsibility: No Current License: Yes Prior Function Level of Independence: Independent with gait, Independent with transfers, Independent with basic ADLs Driving: Yes(pt's wife reports that his driving is unsafe and that he weaves a lot) Comments: significant history of falls ADL ADL Grooming: Setup Where Assessed-Grooming: Sitting at sink Upper Body Bathing: Minimal assistance Where Assessed-Upper Body Bathing: Sitting at sink Lower Body Bathing: Maximal assistance Where Assessed-Lower Body Bathing: Sitting at sink Upper Body Dressing: Minimal assistance Where Assessed-Upper Body Dressing: Sitting at sink Lower Body Dressing: (+2) Where Assessed-Lower Body Dressing: Sitting at sink, Standing at sink ADL Comments: Unable to complete transfers on eval due to pain limiting mobility Vision Baseline Vision/History: Wears glasses Wears Glasses: Reading only Patient Visual Report: No change from baseline Vision Assessment?: Yes Eye Alignment: Within Functional Limits Ocular Range of Motion: Within Functional Limits Alignment/Gaze Preference: Within Defined Limits Tracking/Visual  Pursuits: Decreased smoothness of eye movement to RIGHT superior field;Decreased smoothness of eye movement to RIGHT inferior field;Decreased smoothness of eye movement to LEFT inferior field;Decreased smoothness of eye movement to LEFT superior field;Requires cues, head turns, or add eye shifts to track Saccades: Overshoots;Additional eye shifts occurred during testing;Additional head turns occurred during testing Convergence: Within functional limits Perception  Perception: Within Functional Limits Praxis Praxis: Intact Cognition Overall Cognitive Status: Within Functional Limits for tasks assessed Arousal/Alertness: Awake/alert Orientation Level: Person;Place;Situation Person: Oriented Place: Oriented Situation: Oriented Year: 2020 Month: September Day of Week: Correct Memory: (wife reports occasional memory issues) Immediate Memory Recall: Sock;Blue;Bed Memory Recall Sock: With Cue Memory Recall Blue: Without Cue Memory Recall Bed: Not able to recall Attention: Focused;Sustained Focused Attention: Appears intact Sustained Attention: Appears intact Awareness: Appears intact Problem Solving: Appears intact Safety/Judgment: Appears intact Comments: Patient is HOH and limted by pain, however, no signs of cognitive deficits on evaluation Sensation Sensation Light Touch: Appears Intact Proprioception: Appears Intact Coordination Gross Motor Movements are Fluid and Coordinated: No Fine Motor Movements are Fluid and Coordinated: Yes Coordination and Movement Description: generalized weakness and limited by pain in R hip due to fracture and PWB status Motor  Motor Motor: Other (comment) Motor - Skilled Clinical Observations: generalized weakness and limited by pain in R hip due to fracture and PWB status Extremity/Trunk Assessment RUE Assessment RUE Assessment: Within Functional Limits LUE Assessment LUE Assessment: Within Functional Limits     Refer to Care Plan for Long  Term Goals  Recommendations for other services: None    Discharge   Criteria: Patient will be discharged from OT if patient refuses treatment 3 consecutive times without medical reason, if treatment goals not met, if there is a change in medical status, if patient makes no progress towards goals or if patient is discharged from hospital.  The above assessment, treatment plan, treatment alternatives and goals were discussed and mutually agreed upon: by patient and by family  Ellwood Dense [facility] 03/21/2019, 12:23 PM

## 2019-03-21 NOTE — Progress Notes (Signed)
[facility] PHYSICAL MEDICINE & REHABILITATION PROGRESS NOTE   Subjective/Complaints:  Pt reports slept last night pretty well- pain better controlled.  Wants IV out of R hand- will write for that, since doesn't need IV meds at this time.  Said doing "ok" and is "woke up" now- wife at bedside- pt VERY HOH and doesn't understand everything said by interviewer.   Objective:   No results found. Recent Labs    03/19/19 0338 03/21/19 0520  WBC 41.1* 41.9*  HGB 8.5* 9.4*  HCT 25.6* 28.3*  PLT 464* PLATELET CLUMPS NOTED ON SMEAR, UNABLE TO ESTIMATE   Recent Labs    03/21/19 0520  NA 135  K 4.4  CL 101  CO2 27  GLUCOSE 137*  BUN 21  CREATININE 0.94  CALCIUM 8.4*    Intake/Output Summary (Last 24 hours) at 03/21/2019 1243 Last data filed at 03/21/2019 1000 Gross per 24 hour  Intake 478 ml  Output 2000 ml  Net -1522 ml     Physical Exam: Vital Signs Blood pressure (!) 106/44, pulse 66, temperature 97.8 F (36.6 C), resp. rate 17, height 5' 5.98" (1.676 m), weight 92.3 kg, SpO2 95 %.   Physical Exam Nursing note, labs, and vitalsreviewed. Constitutional:  Awake, alert, appropriate, wife at bedside, same with PT; lying supine in bed, NAD HENT:  Head:Macrocephalic. Healing abrasion on left cheek- almost healed CV- regular rate; IRR Respiratory:Effort normal. congested cough noted.coarse breath sounds, but otherwise good air movement B/L GI: He exhibitsno distension. There isno abdominal tenderness.  (+) BS Musculoskeletal:  Strength- 5/5 in UEs and B/L LLE HF 3/5, otherwise 4+/5 distally in KE/KF/DF and PF RLE- HF 1/5 due to pain; distally 4+/5 in KE/KF/DF and PF Comments: Right flank with very large hematoma that's purple; a smaller bruise but larger hematoma on R lateral mid back with protrusion of skin by hematoma.  Right hip incision with dry dressing. Neurological: He is alertand oriented to person, place, and time.  Skin: Skin iswarmand  dry. wearing condom catheter   Assessment/Plan: 1. Functional deficits secondary to R periprosthetic hip fracture due to ground level fall and poor balance which require 3+ hours per day of interdisciplinary therapy in a comprehensive inpatient rehab setting.  Physiatrist is providing close team supervision and 24 hour management of active medical problems listed below.  Physiatrist and rehab team continue to assess barriers to discharge/monitor patient progress toward functional and medical goals  Care Tool:  Bathing    Body parts bathed by patient: Right arm, Left arm, Chest, Abdomen, Front perineal area, Right upper leg, Left upper leg, Face   Body parts bathed by helper: Right lower leg, Left lower leg, Buttocks     Bathing assist Assist Level: 2 Helpers     Upper Body Dressing/Undressing Upper body dressing   What is the patient wearing?: Pull over shirt    Upper body assist Assist Level: Minimal Assistance - Patient > 75%    Lower Body Dressing/Undressing Lower body dressing      What is the patient wearing?: Pants     Lower body assist Assist for lower body dressing: 2 Helpers     Toileting Toileting    Toileting assist Assist for toileting: (condom cath )     Transfers Chair/bed transfer  Transfers assist           Locomotion Ambulation   Ambulation assist              Walk 10 feet activity  Assist           Walk 50 feet activity   Assist           Walk 150 feet activity   Assist           Walk 10 feet on uneven surface  activity   Assist           Wheelchair     Assist               Wheelchair 50 feet with 2 turns activity    Assist            Wheelchair 150 feet activity     Assist          Blood pressure (!) 106/44, pulse 66, temperature 97.8 F (36.6 C), resp. rate 17, height 5' 5.98" (1.676 m), weight 92.3 kg, SpO2 95 %.    Medical Problem List and Plan: 1.  R hip periprosthetic fracture s/p ORIF secondary to ground level fall at home due to poor balance.            -will need 15/7 for therapy due to poor movement. 2. Antithrombotics: -DVT/anticoagulation:Pharmaceutical:Coumadin -antiplatelet therapy: on coumadin for Afib- INR 2.0 3. Pain Management:continue gabapentin tid for neuropathy and Norco 7/5/325 mg q4 hours prn for pain  9/29- pain doing OK per pt currently- might need increase in Norco dose, possibly- will monitor 4. Mood:LCSW to follow for evaluation and support. -antipsychotic agents: N/A 5. Neuropsych: This patient is capable of making decisions on his own behalf. 6. Skin/Wound Care:Monitor wound for healing. Continue nutritional supplement to promote healing.            -did entire body skin check- has 2 large hematomas and a R hip incision- will need kinesiotaping for 1 of hematomas 7. Fluids/Electrolytes/Nutrition:Monitor I/O. Check lytes in am. 8. Chronic A FIB: Monitor HR tid--continue coreg. Monitor for orthostatic symptoms.  9/29- INR 2.0- will d/c IV 9. CLL: WBC trending back down- is normally in 20s.   9/29- stable at 41k- will con't to monitor- if contues to increase, will call Heme/onc. 10 ABLA: Continue to monitor H/H--stabilizing around 8. On iron supplement.  11. T2DM: On lantus 35 units daily with SSI for elevated BS. Monitor BS ac/hs and titrate lantus as indicated.   9/29- BGs adequate control for now- will monitor  CBG (last 3)  Recent Labs    03/20/19 2125 03/21/19 0620 03/21/19 1107  GLUCAP 201* 125* 201*    12.Hyponatremia: Will check follow up labs in am.  9/29- labs normalized 13. Chronic cough (due to chronic aspiration): per records has refused dysphagia diet. On duonebs tid. On Neurontin for cough? Keep HOB>30 degrees 14. Constipation due to opioids- will schedule Senokot S 2 tabs BID and use prns as necessary, 15. Poor balance            - will have  vestibular PT evaluation done and see if can be helped at all.  9/29- d/w PT- she will make sure it's done.    LOS: 1 days A FACE TO FACE EVALUATION WAS PERFORMED  Keontay Vora 03/21/2019, 12:43 PM

## 2019-03-21 NOTE — Progress Notes (Signed)
Physical Therapy Session Note  Patient Details  Name: Oscar Baker MRN: [id number] Date of Birth: 06/09/1933  Today's Date: 03/21/2019 PT Individual Time: 1315-1325 PT Individual Time Calculation (min): 10 min   Short Term Goals: Week 1:  PT Short Term Goal 1 (Week 1): Patient will perform basic transfer using LRAD consistenty with mod A of 1 person. PT Short Term Goal 2 (Week 1): Patient will perform sit<>supine with mod A. PT Short Term Goal 3 (Week 1): Patient will propel w/c >50 feet.  Skilled Therapeutic Interventions/Progress Updates:     Patient in w/c with his wife in the room upon PT arrival, RN requesting transfer assistance and education for patient to transfer to the [facility]. Patient alert and agreeable to PT session. Patient reported 4/10 R hip pain during session, RN aware. PT provided repositioning, rest breaks, and distraction as pain interventions throughout session.   Therapeutic Activity: Transfers: Patient performed a stand pivot transfer w/c>BSC using a RW with mod-min A. He was able to take 3 steps during pivot this afternoon. Provided verbal cues for hand placement, placing R foot ahead to stand/sit to off-weight that LE, and sequencing for stepping with use of UEs to maintain PWB on R. Demonstrated technique for RN and NT and patient's wife, recommended +2 for safety during transfers. Patient and nursing staff appreciative of the education.   Patient on [facility] with RN and his wife in the room at end of session.  Therapy Documentation Precautions:  Precautions Precautions: Fall Restrictions Weight Bearing Restrictions: Yes RLE Weight Bearing: Partial weight bearing RLE Partial Weight Bearing Percentage or Pounds: 50    Therapy/Group: Individual Therapy  Tazia Illescas L Miniya Miguez PT, DPT  03/21/2019, 5:16 PM

## 2019-03-21 NOTE — Evaluation (Addendum)
Physical Therapy Assessment and Plan  Patient Details  Name: Oscar Baker MRN: [id number] Date of Birth: 06/17/1933  PT Diagnosis: Abnormal posture, Abnormality of gait, Difficulty walking, Edema, Muscle weakness, Pain in joint and Pain in mid back and R hip Rehab Potential: Fair ELOS: 3 weeks   Today's Date: 03/21/2019 PT Individual Time: 0800-0915 PT Individual Time Calculation (min): 75 min    Problem List:  Patient Active Problem List   Diagnosis Date Noted  . Periprosthetic fracture around internal prosthetic right hip joint ([place]) 03/20/2019  . Closed right hip fracture, initial encounter ([place]) 03/10/2019  . Chronic bronchitis with productive mucopurulent cough ([place]) 08/22/2018  . Acute systolic heart failure ([place]) 08/02/2018  . Type 2 diabetes mellitus ([place]) 08/02/2018  . Pain in joint of right shoulder 06/08/2018  . CAD (coronary artery disease) 05/21/2018  . Asthma exacerbation 05/21/2018  . Aspiration pneumonia ([place]) 05/21/2018  . Elevated INR   . Shoulder strain, right, initial encounter   . Acute respiratory failure with hypoxia ([place]) 05/20/2018  . Bilateral impacted cerumen 02/14/2018  . Asymmetric SNHL (sensorineural hearing loss) 02/14/2018  . Encounter for therapeutic drug monitoring 12/20/2017  . S/P thoracotomy 11/29/2017  . Sepsis ([place])   . Hypoxemia   . Pneumonia of left lower lobe due to infectious organism ([place])   . Mediastinal lymphadenopathy   . Chronic cough 11/23/2017  . CLL (chronic lymphocytic leukemia) ([place]) 11/23/2017  . Pleural effusion 11/23/2017  . HCAP (healthcare-associated pneumonia) 11/20/2017  . Anemia 11/20/2017  . Mixed conductive and sensorineural hearing loss of right ear with restricted hearing of left ear 09/14/2017  . Abnormal hearing test 09/06/2017  . Cellulitis 09/06/2017  . Lymphadenitis, acute 09/06/2017  . Ischemic cardiomyopathy 09/01/2017  . S/p bilateral revision of total hip arthroplasty 08/11/2017  . History of  revision of total replacement of left hip joint 08/11/2017  . Right hip pain   . Acute otitis media   . Chronic systolic CHF (congestive heart failure) ([place])   . Pressure injury of skin 07/20/2017  . Pressure ulcer 07/20/2017  . Malignant otitis externa 07/19/2017  . Fall in home 07/19/2017  . Acute on chronic diastolic heart failure ([place]) 07/19/2017  . S/P left THA, AA 05/12/2016  . History of repair of hip joint 05/12/2016  . Laryngopharyngeal reflux 05/21/2015  . Allergic rhinitis 05/21/2015  . Obesity 12/12/2014  . Upper airway cough syndrome 12/11/2014  . Posterior rhinorrhea 12/11/2014  . Cough variant asthma 07/27/2014  . Wheezing 06/20/2014  . Symptomatic bradycardia - s/p Biotronik (serial number [id number]) 02/08/2014  . Bradycardia 02/08/2014  . Diabetes mellitus type 2, noninsulin dependent ([place])   . Mixed incontinence 03/09/2013  . Edema of foot 03/06/2013  . Traumatic ecchymosis of right foot 03/06/2013  . Contusion of foot 03/06/2013  . Malignant neoplasm of prostate ([place]) 02/15/2013  . Diabetes mellitus without complication ([place]) 47/42/5956  . Bone spur 12/30/2012  . Pain of toe of left foot 12/30/2012  . Hyperlipidemia 07/13/2012  . Hypertensive disorder 04/12/2012  . Sick sinus syndrome ([place]) 08/17/2011  . PAF (paroxysmal atrial fibrillation) ([place]) 09/16/2010    Past Medical History:  Past Medical History:  Diagnosis Date  . Asthma 1950's   history of  . Atrial fibrillation ([place])   . Bilateral carpal tunnel syndrome   . Bilateral lower extremity edema   . Bladder tumor   . Chronic systolic heart failure (H[facility])    Echo 1/19: Mild LVH, EF 45-50, inf HK, MAC, severe LAE,  severe RAE // Echo 7/15: Mild LVH, mod focal basal sept hypertrophy, EF 55-60, AV peak and mean 16/9, trivial MR, mod LAE, PASP 38  . CLL (chronic lymphocytic leukemia) [facility]) oncologist-  dr Ilene Qua--   dx [phone] ;  Lymphocytosis, CLL - per lov note 05-11-2017 currently under active  survillance,  CT 04-17-2014 show very small lymphadenopathy, no indication for treatment  . Coronary artery disease    cardiologist-  dr Cathie Olden--  08-18-2017 Intermittant risk nuclear study w/ large area of inferior infartion with no evidence ishcemia   . 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 Diabetes mellitus type 2, noninsulin dependent ([place])   . Elevated PSA    since prostatectomy but now resolved  . Hematuria 04/2017  . History of ear infection    Right  . History of MI (myocardial infarction)    per myoview nuclear study 08-18-2017 , unknown when  . History of shingles 08/2017   L ear and scalp, possible  . Hyperlipidemia   . Hypertension   . Ischemic cardiomyopathy 09/01/2017   Presumed +CAD with Nuclear stress test 08/18/17 - Inferior scar, no ischemia, intermediate risk // med management unless +angina or worse dyspnea  . OA (osteoarthritis)   . Pacemaker 02/08/2014   followed by dr g. taylor--  single chamber Biotronik due to SSS  . Permanent atrial fibrillation   . Pneumonia 2019   Left lung  . Prostate cancer [facility]) urologist-  dr Diona Fanti   dx 2004--  Gleason 8, PSA 10.45--  11-28-2002  s/p  radical prostatectomy;  recurrent w/ increasing PSA, started ADT treatment  . RBBB (right bundle branch block)   . Sick sinus syndrome ([place])    a-Flutter with episodes of bradycardia; S/P Biotronik (serial number [id number]) 02-08-2014  . Urinary incontinence   . Wears hearing aid in right ear    receiver and transmitter   Past Surgical History:  Past Surgical History:  Procedure Laterality Date  . APPENDECTOMY    . BACK SURGERY     disk  . CARDIAC CATHETERIZATION  09-03-1999  dr Cathie Olden   abnormal cardiolite study:  minor luminal irregularities but no critial coronary artery stenosis  . CARDIOVASCULAR STRESS TEST  08-18-2017  dr Cathie Olden   Intermediate risk nuclear study w/ large area inferior infarction, no evidence of ishcemia (consistant w/ prior MI)/  study not gated due to frequent PVCs   . CARPAL TUNNEL RELEASE Right 2000  . CARPAL TUNNEL RELEASE Left 11/19/2009  . CATARACT EXTRACTION Right 07/2015  . CATARACT EXTRACTION Left 09/2015  . CYSTOSCOPY N/A 11/04/2017   Procedure: CYSTOSCOPY AND CAUTERIZATION OF BLADDER;  Surgeon: Franchot Gallo, MD;  Location: [facility];  Service: Urology;  Laterality: N/A;  . INSERTION PENILE PROSTHESIS  02-22-2004    dr Mattie Marlin  [name]  . IR THORACENTESIS ASP PLEURAL SPACE W/IMG GUIDE  11/26/2017  . KNEE ARTHROSCOPY Left 07/2010  . ORIF PERIPROSTHETIC FRACTURE Right 03/15/2019   Procedure: OPEN REDUCTION INTERNAL FIXATION (ORIF) PERIPROSTHETIC FRACTURE WITH ZIMMER CABLES;  Surgeon: Paralee Cancel, MD;  Location: [place];  Service: Orthopedics;  Laterality: Right;  . PERMANENT PACEMAKER INSERTION N/A 02/08/2014   Procedure: PERMANENT PACEMAKER INSERTION;  Surgeon: Evans Lance, MD;  Location: [facility] CATH LAB;  Service: Cardiovascular;  Laterality: N/A;  . PLEURAL EFFUSION DRAINAGE Left 11/30/2017   Procedure: DRAINAGE OF HEMOTHORAX;  Surgeon: Ivin Poot, MD;  Location: [place];  Service: Thoracic;  Laterality: Left;  . RADICAL RETROPUBIC PROSTATECTOMY W/ BILATERAL  PELVIC LYMPH NODE DISSECTION  11-28-2002   dr Mattie Marlin  [facility]  . TONSILLECTOMY    . TOTAL HIP ARTHROPLASTY Left 05/12/2016   Procedure: LEFT TOTAL HIP ARTHROPLASTY ANTERIOR APPROACH;  Surgeon: Paralee Cancel, MD;  Location: [facility];  Service: Orthopedics;  Laterality: Left;  . TOTAL HIP ARTHROPLASTY Right 07-15-2006   dr Alvan Dame  [facility]  . TRANSTHORACIC ECHOCARDIOGRAM  07/20/2017   mild LVH, ef 45-50%, hypokinesis of the basal-midinferior myocardium, due to AFib unable to evaluate diastolic function/  severe LAE and RAE/  trivial PR and TR  . VIDEO ASSISTED THORACOSCOPY Left 11/30/2017   Procedure: VIDEO ASSISTED THORACOSCOPY;  Surgeon: Ivin Poot, MD;  Location: [place];  Service: Thoracic;  Laterality: Left;  [name] VIDEO ASSISTED THORACOSCOPY (VATS)/EMPYEMA Left 11/29/2017    Procedure: VIDEO ASSISTED THORACOSCOPY (VATS)/EMPYEMA;  Surgeon: Ivin Poot, MD;  Location[facility] OR;  Service: Thoracic;  Laterality: Left;    Assessment & Plan Clinical Impression: Patient is a 85 y.o. year old male with history of AF, bradycardia s/p PPM, T2DM, CLL, Anemia who was admitted on 03/10/19 with fall backwards and struck his chest and midback. He was found to have minimally displaced right periprosthetic hip fracture and mild bruising on back with hematoma. Chronic coumadin allowed to drift down and bridged with heparin prior to surgery. Hospital course significant for rate controlled A flutter, leucocytosis, poorly controlled BS as well as limitations due to pain. He underwent ORIF right hip on 09/23 by Dr. Alvan Dame and to be [facility] for 6-8 weeks. Coumadin resumed and is therapeutic. Hospital course significant for ABLA with rise in WBC from 33-->43.4-->41 and hyponatremia No fevers but BP has been soft. Therapy evaluations completed and CIR recommended due to functional decline in mobility and ability to complete ADLs.  Patient transferred to CIR on 03/20/2019 .   Patient currently requires max with mobility secondary to muscle weakness and muscle joint tightness, decreased cardiorespiratoy endurance, TBD with vestibular evaluation and decreased sitting balance, decreased standing balance, decreased postural control, decreased balance strategies and difficulty maintaining precautions.  Prior to hospitalization, patient was modified independent  with mobility and lived with Spouse in a [place] home.  Home access is  Level entry.  Patient will benefit from skilled PT intervention to maximize safe functional mobility, minimize fall risk and decrease caregiver burden for planned discharge home with 24 hour supervision.  Anticipate patient will benefit from follow up [place] at discharge.  PT - End of Session Activity Tolerance: Tolerates 30+ min activity with multiple rests Endurance Deficit:  Yes Endurance Deficit Description: requires rest breaks following all activity and some SOB noted following stand pivot transfer PT Assessment Rehab Potential (ACUTE/IP ONLY): Fair PT Barriers to Discharge: Decreased caregiver support;Behavior;Other (comments) PT Barriers to Discharge Comments: Patient's wife is the only assistance he has a d/c, can only provide supervision; some self limiting behaviors with mobility 2/2 fear of falling, significant pain and sensitivity on R back, hip, and thigh PT Patient demonstrates impairments in the following area(s): Balance;Behavior;Edema;Endurance;Motor;Nutrition;Pain;Safety;Sensory;Skin Integrity PT Transfers Functional Problem(s): Bed Mobility;Bed to Chair;Car;Furniture;Floor PT Locomotion Functional Problem(s): Ambulation;Wheelchair Mobility;Stairs PT Plan PT Intensity: Minimum of 1-2 x/day ,45 to 90 minutes PT Frequency: Total of 15 hours over 7 days of combined therapies PT Duration Estimated Length of Stay: 3 weeks PT Treatment/Interventions: Ambulation/gait training;Discharge planning;Functional mobility training;Psychosocial support;Therapeutic Activities;Visual/perceptual remediation/compensation;Balance/vestibular training;Disease management/prevention;Neuromuscular re-education;Skin care/wound management;Therapeutic Exercise;Wheelchair propulsion/positioning;DME/adaptive equipment instruction;Pain management;Splinting/orthotics;UE/LE Strength taining/ROM;Community reintegration;Functional electrical stimulation;Patient/family education;Stair training;UE/LE Coordination activities PT Transfers Anticipated Outcome(s): supervision  PT Locomotion Anticipated Outcome(s): d/c w/c level only, ambulating with therapies for safety with min A PT Recommendation Recommendations for Other Services: Vestibular eval;Neuropsych consult Follow Up Recommendations: Home health PT Patient destination: Home Equipment Recommended: To be determined Equipment Details:  has RW and rollator, will need w/c will provide measurements upon further assessment  Skilled Therapeutic Intervention In addition to the PT evaluation below, the patient performed the following skilled PT interventions: Patient in bed with his wife at bedside upon PT arrival. Patient alert and agreeable to PT session. Patient reported 7/10 R hip and mid back pain at rest at beginning of session and began crying out in pain with R LE mobility and transfers during session, RN made aware. PT provided repositioning, rest breaks, and distraction as pain interventions throughout session.   Therapeutic Activity: Bed Mobility: Patient performed rolling L, unable to tolerate rolling R, and supine to sit with max A. Provided verbal cues for grabbing the EOB to assist with rolling and pushing through B elbows to sit up once he cleared his heels off the bed. Transfers: Patient attempt sit to/from stand x1 with max A without success with the bed in lowest setting. He then performed sit to/from stand x1 with bed slightly elevated  with mod-max A using the RW. Provided verbal cues for placing R foot forward to off-weight R LE weightbearing to adhere to [facility] precautions. He performed a stand pivot transfer with mod A to pivot and min A to sit in the w/c using the RW. Maintained PWB on R by keeping R LE forward and pivoting only on L foot. Instructed patient on off-weighting R LE with his UEs using the RW to attempt to step with pivot, but patient could not tolerate stepping at this time due to pain.   Patient stated he could not tolerate any more mobility at this time. PT provided a R elevating leg rest and a pillow behind his back for comfort in the w/c. Patient reported that he felt much more comfortable than when he was in the bed. Educated patient on alternating positions to avoid stiffness and discomfort throughout the day, LE elevation for edema management, and technique for using the RW to transfer back to bed with  nursing staff as needed.    Patient currently requires significant time and encouragement with all mobility limited by pain and fear of falling. Patient reports 1 fall per month the past 3 months.   Therapeutic Exercise: Patient performed the following exercises with verbal and tactile cues for proper technique. -R SLR 2x5 with max A from PT -R heel slides 2x5 with mod-max A from PT  Patient in w/c with his wife in the room at end of session with breaks locked, seat belt alarm set, and all needs within reach.   Instructed pt in results of PT evaluation as detailed below, PT POC, rehab potential, rehab goals, and discharge recommendations. Additionally discussed CIR's policies regarding fall safety and use of chair alarm and/or quick release belt. Pt verbalized understanding and in agreement. Will update pt's family members as they become available.    PT Evaluation Precautions/Restrictions Precautions Precautions: Fall Restrictions Weight Bearing Restrictions: Yes RLE Weight Bearing: Partial weight bearing RLE Partial Weight Bearing Percentage or Pounds: 50% Pain Pain Assessment Pain Scale: 0-10 Pain Score: 0-No pain Pain Type: Surgical pain Pain Location: Hip Pain Orientation: Right Pain Descriptors / Indicators: Burning;Spasm Pain Intervention(s): RN made aware;Repositioned Home Living/Prior Functioning Home Living Available Help at Discharge: Available 24 hours/day;Family(wife  can provide supervision only) Type of Home: Apartment Home Access: Level entry Home Layout: One level Bathroom Shower/Tub: Multimedia programmer: Handicapped height Bathroom Accessibility: Yes  Lives With: Spouse Prior Function Level of Independence: Independent with transfers;Independent with basic ADLs;Independent with gait;Requires assistive device for independence Driving: Yes Comments: significant history of falls Vision/Perception  Vision - Assessment Eye Alignment: Within  Functional Limits Ocular Range of Motion: Within Functional Limits Alignment/Gaze Preference: Within Defined Limits Tracking/Visual Pursuits: Decreased smoothness of eye movement to RIGHT superior field;Decreased smoothness of eye movement to RIGHT inferior field;Decreased smoothness of eye movement to LEFT inferior field;Decreased smoothness of eye movement to LEFT superior field;Requires cues, head turns, or add eye shifts to track Saccades: Overshoots;Additional eye shifts occurred during testing;Additional head turns occurred during testing Convergence: Within functional limits Perception Perception: Within Functional Limits Praxis Praxis: Intact  Cognition Overall Cognitive Status: Within Functional Limits for tasks assessed Arousal/Alertness: Awake/alert Attention: Focused;Sustained Focused Attention: Appears intact Sustained Attention: Appears intact Memory: (wife reports occasional memory issues) Immediate Memory Recall: Sock;Blue;Bed Memory Recall Sock: With Cue Memory Recall Blue: Without Cue Memory Recall Bed: Not able to recall Awareness: Appears intact Problem Solving: Appears intact Safety/Judgment: Appears intact Comments: Patient is HOH and limted by pain, however, no signs of cognitive deficits on evaluation Sensation Sensation Light Touch: Appears Intact Proprioception: Appears Intact Coordination Gross Motor Movements are Fluid and Coordinated: No Fine Motor Movements are Fluid and Coordinated: Yes Coordination and Movement Description: generalized weakness and limited by pain in R hip due to fracture and PWB status Motor  Motor Motor: Other (comment) Motor - Skilled Clinical Observations: generalized weakness and limited by pain in R hip due to fracture and PWB status  Mobility Bed Mobility Bed Mobility: Rolling Right;Supine to Sit Rolling Right: Maximal Assistance - Patient 25-49% Supine to Sit: Maximal Assistance - Patient - Patient  25-49% Transfers Transfers: Sit to Stand;Stand to Sit;Stand Pivot Transfers Sit to Stand: Moderate Assistance - Patient 50-74% Stand to Sit: Minimal Assistance - Patient > 75% Stand Pivot Transfers: Minimal Assistance - Patient > 75% Stand Pivot Transfer Details: Tactile cues for sequencing;Verbal cues for technique;Verbal cues for precautions/safety;Manual facilitation for weight shifting;Verbal cues for safe use of DME/AE;Verbal cues for gait pattern;Verbal cues for sequencing Transfer (Assistive device): Rolling walker Locomotion  Gait Ambulation: No(decreased strength/activity tolerance, increased pain) Gait Gait: No(decreased strength/activity tolerance, increased pain) Stairs / Additional Locomotion Stairs: No(decreased strength/activity tolerance, increased pain) Wheelchair Mobility Wheelchair Mobility: No(decreased strength/activity tolerance, increased pain)  Trunk/Postural Assessment  Cervical Assessment Cervical Assessment: Exceptions to WFL(forward head) Thoracic Assessment Thoracic Assessment: Exceptions to WFL(Kyphosis) Lumbar Assessment Lumbar Assessment: Exceptions to WFL(posterior pelvic tilt) Postural Control Postural Control: Deficits on evaluation(decreased/delayed)  Balance Balance Balance Assessed: Yes Static Sitting Balance Static Sitting - Balance Support: Right upper extremity supported;Left upper extremity supported;Feet supported Static Sitting - Level of Assistance: 5: Stand by assistance Dynamic Sitting Balance Dynamic Sitting - Balance Support: Right upper extremity supported;Left upper extremity supported;Feet supported;During functional activity Dynamic Sitting - Level of Assistance: 4: Min assist Dynamic Sitting - Balance Activities: Lateral lean/weight shifting;Forward lean/weight shifting Static Standing Balance Static Standing - Balance Support: Bilateral upper extremity supported;During functional activity Static Standing - Level of  Assistance: 3: Mod assist Dynamic Standing Balance Dynamic Standing - Balance Support: Bilateral upper extremity supported;During functional activity Dynamic Standing - Level of Assistance: 3: Mod assist Dynamic Standing - Comments: during stand pivot transfer Extremity Assessment  RUE Assessment RUE Assessment: Within Functional Limits LUE Assessment LUE Assessment:  Within Functional Limits RLE Assessment RLE Assessment: Exceptions to [facility] Active Range of Motion (AROM) Comments: hip extension at least to neutral in lying, limited hip and knee flexion ~25-50 degrees General Strength Comments: limited testing due to significant pain with moiblity, requires mod-max A for SLR in supine LLE Assessment LLE Assessment: Within Functional Limits Active Range of Motion (AROM) Comments: WFL for functional mobility, will continue to assess General Strength Comments: Grossly at least 3+/5 with functional mobilty    Refer to Care Plan for Long Term Goals  Recommendations for other services: Neuropsych and Other: Vestibular Evaluation  Discharge Criteria: Patient will be discharged from PT if patient refuses treatment 3 consecutive times without medical reason, if treatment goals not met, if there is a change in medical status, if patient makes no progress towards goals or if patient is discharged from hospital.  The above assessment, treatment plan, treatment alternatives and goals were discussed and mutually agreed upon: by patient and by family  Richetta Cubillos L Vedh Ptacek PT, DPT  03/21/2019, 1:22 PM

## 2019-03-21 NOTE — Plan of Care (Signed)
  Problem: Consults Goal: RH GENERAL PATIENT EDUCATION Description: See Patient Education module for education specifics. Outcome: Progressing Goal: Skin Care Protocol Initiated - if Braden Score 18 or less Description: If consults are not indicated, leave blank or document N/A Outcome: Progressing Goal: Diabetes Guidelines if Diabetic/Glucose > 140 Description: If diabetic or lab glucose is > 140 mg/dl - Initiate Diabetes/Hyperglycemia Guidelines & Document Interventions  Outcome: Progressing   Problem: RH BOWEL ELIMINATION Goal: RH STG MANAGE BOWEL WITH ASSISTANCE Description: STG Manage Bowel with min Assistance. Outcome: Progressing Goal: RH STG MANAGE BOWEL W/MEDICATION W/ASSISTANCE Description: STG Manage Bowel with Medication with min Assistance. Outcome: Progressing   Problem: RH SKIN INTEGRITY Goal: RH STG SKIN FREE OF INFECTION/BREAKDOWN Description: Patients skin will remain free from further infection or breakdown with mod assist. Outcome: Progressing Goal: RH STG MAINTAIN SKIN INTEGRITY WITH ASSISTANCE Description: STG Maintain Skin Integrity With mod Assistance. Outcome: Progressing Goal: RH STG ABLE TO PERFORM INCISION/WOUND CARE W/ASSISTANCE Description: STG Able To Perform Incision/Wound Care With total Assistance from caregiver. Outcome: Progressing   Problem: RH SAFETY Goal: RH STG ADHERE TO SAFETY PRECAUTIONS W/ASSISTANCE/DEVICE Description: STG Adhere to Safety Precautions With supervision Assistance/Device. Outcome: Progressing   Problem: RH PAIN MANAGEMENT Goal: RH STG PAIN MANAGED AT OR BELOW PT'S PAIN GOAL Description: < 4 Outcome: Progressing   Problem: RH BLADDER ELIMINATION Goal: RH STG MANAGE BLADDER WITH ASSISTANCE Description: STG Manage Bladder With min Assistance Outcome: Not Applicable

## 2019-03-21 NOTE — Progress Notes (Signed)
Social Work Assessment and Plan   Patient Details  Name: Oscar Baker MRN: [id number] Date of Birth: [birth year]  Today's Date: 03/21/2019  Problem List:  Patient Active Problem List   Diagnosis Date Noted  . Periprosthetic fracture around internal prosthetic right hip joint ([place]) 03/20/2019  . Closed right hip fracture, initial encounter ([place]) 03/10/2019  . Chronic bronchitis with productive mucopurulent cough ([place]) 08/22/2018  . Acute systolic heart failure ([place]) 08/02/2018  . Type 2 diabetes mellitus ([place]) 08/02/2018  . Pain in joint of right shoulder 06/08/2018  . CAD (coronary artery disease) 05/21/2018  . Asthma exacerbation 05/21/2018  . Aspiration pneumonia ([place]) 05/21/2018  . Elevated INR   . Shoulder strain, right, initial encounter   . Acute respiratory failure with hypoxia ([place]) 05/20/2018  . Bilateral impacted cerumen 02/14/2018  . Asymmetric SNHL (sensorineural hearing loss) 02/14/2018  . Encounter for therapeutic drug monitoring 12/20/2017  . S/P thoracotomy 11/29/2017  . Sepsis ([place])   . Hypoxemia   . Pneumonia of left lower lobe due to infectious organism ([place])   . Mediastinal lymphadenopathy   . Chronic cough 11/23/2017  . CLL (chronic lymphocytic leukemia) ([place]) 11/23/2017  . Pleural effusion 11/23/2017  . HCAP (healthcare-associated pneumonia) 11/20/2017  . Anemia 11/20/2017  . Mixed conductive and sensorineural hearing loss of right ear with restricted hearing of left ear 09/14/2017  . Abnormal hearing test 09/06/2017  . Cellulitis 09/06/2017  . Lymphadenitis, acute 09/06/2017  . Ischemic cardiomyopathy 09/01/2017  . S/p bilateral revision of total hip arthroplasty 08/11/2017  . History of revision of total replacement of left hip joint 08/11/2017  . Right hip pain   . Acute otitis media   . Chronic systolic CHF (congestive heart failure) ([place])   . Pressure injury of skin 07/20/2017  . Pressure ulcer 07/20/2017  . Malignant otitis externa  07/19/2017  . Fall in home 07/19/2017  . Acute on chronic diastolic heart failure ([place]) 07/19/2017  . S/P left THA, AA 05/12/2016  . History of repair of hip joint 05/12/2016  . Laryngopharyngeal reflux 05/21/2015  . Allergic rhinitis 05/21/2015  . Obesity 12/12/2014  . Upper airway cough syndrome 12/11/2014  . Posterior rhinorrhea 12/11/2014  . Cough variant asthma 07/27/2014  . Wheezing 06/20/2014  . Symptomatic bradycardia - s/p Biotronik (serial number [id number]) 02/08/2014  . Bradycardia 02/08/2014  . Diabetes mellitus type 2, noninsulin dependent ([place])   . Mixed incontinence 03/09/2013  . Edema of foot 03/06/2013  . Traumatic ecchymosis of right foot 03/06/2013  . Contusion of foot 03/06/2013  . Malignant neoplasm of prostate ([place]) 02/15/2013  . Diabetes mellitus without complication ([place]) 47/42/5956  . Bone spur 12/30/2012  . Pain of toe of left foot 12/30/2012  . Hyperlipidemia 07/13/2012  . Hypertensive disorder 04/12/2012  . Sick sinus syndrome ([place]) 08/17/2011  . PAF (paroxysmal atrial fibrillation) ([place]) 09/16/2010   Past Medical History:  Past Medical History:  Diagnosis Date  . Asthma 1950's   history of  . Atrial fibrillation ([place])   . Bilateral carpal tunnel syndrome   . Bilateral lower extremity edema   . Bladder tumor   . Chronic systolic heart failure (HCC)    Echo 1/19: Mild LVH, EF 45-50, inf HK, MAC, severe LAE, severe RAE // Echo 7/15: Mild LVH, mod focal basal sept hypertrophy, EF 55-60, AV peak and mean 16/9, trivial MR, mod LAE, PASP 38  . CLL (chronic lymphocytic leukemia) [facility]) oncologist-  dr Ilene Qua--   dx [phone] ;  Lymphocytosis, CLL -  per lov note 05-11-2017 currently under active survillance,  CT 04-17-2014 show very small lymphadenopathy, no indication for treatment  . Coronary artery disease    cardiologist-  dr Cathie Olden--  08-18-2017 Intermittant risk nuclear study w/ large area of inferior infartion with no evidence ishcemia   . Deafness in  right ear   . Diabetes mellitus type 2, noninsulin dependent ([place])   . Elevated PSA    since prostatectomy but now resolved  . Hematuria 04/2017  . History of ear infection    Right  . History of MI (myocardial infarction)    per myoview nuclear study 08-18-2017 , unknown when  . History of shingles 08/2017   L ear and scalp, possible  . Hyperlipidemia   . Hypertension   . Ischemic cardiomyopathy 09/01/2017   Presumed +CAD with Nuclear stress test 08/18/17 - Inferior scar, no ischemia, intermediate risk // med management unless +angina or worse dyspnea  . OA (osteoarthritis)   . Pacemaker 02/08/2014   followed by dr g. taylor--  single chamber Biotronik due to SSS  . Permanent atrial fibrillation   . Pneumonia 2019   Left lung  . Prostate cancer [facility]) urologist-  dr Diona Fanti   dx 2004--  Gleason 8, PSA 10.45--  11-28-2002  s/p  radical prostatectomy;  recurrent w/ increasing PSA, started ADT treatment  . RBBB (right bundle branch block)   . Sick sinus syndrome ([place])    a-Flutter with episodes of bradycardia; S/P Biotronik (serial number [id number]) 02-08-2014  . Urinary incontinence   . Wears hearing aid in right ear    receiver and transmitter   Past Surgical History:  Past Surgical History:  Procedure Laterality Date  . APPENDECTOMY    . BACK SURGERY     disk  . CARDIAC CATHETERIZATION  09-03-1999  dr Cathie Olden   abnormal cardiolite study:  minor luminal irregularities but no critial coronary artery stenosis  . CARDIOVASCULAR STRESS TEST  08-18-2017  dr Cathie Olden   Intermediate risk nuclear study w/ large area inferior infarction, no evidence of ishcemia (consistant w/ prior MI)/  study not gated due to frequent PVCs  . CARPAL TUNNEL RELEASE Right 2000  . CARPAL TUNNEL RELEASE Left 11/19/2009  . CATARACT EXTRACTION Right 07/2015  . CATARACT EXTRACTION Left 09/2015  . CYSTOSCOPY N/A 11/04/2017   Procedure: CYSTOSCOPY AND CAUTERIZATION OF BLADDER;  Surgeon: Franchot Gallo, MD;   Location: [facility];  Service: Urology;  Laterality: N/A;  . INSERTION PENILE PROSTHESIS  02-22-2004    dr Mattie Marlin  [facility]  . IR THORACENTESIS ASP PLEURAL SPACE W/IMG GUIDE  11/26/2017  . KNEE ARTHROSCOPY Left 07/2010  . ORIF PERIPROSTHETIC FRACTURE Right 03/15/2019   Procedure: OPEN REDUCTION INTERNAL FIXATION (ORIF) PERIPROSTHETIC FRACTURE WITH ZIMMER CABLES;  Surgeon: Paralee Cancel, MD;  Location: [place];  Service: Orthopedics;  Laterality: Right;  . PERMANENT PACEMAKER INSERTION N/A 02/08/2014   Procedure: PERMANENT PACEMAKER INSERTION;  Surgeon: Evans Lance, MD;  Location: [facility] CATH LAB;  Service: Cardiovascular;  Laterality: N/A;  . PLEURAL EFFUSION DRAINAGE Left 11/30/2017   Procedure: DRAINAGE OF HEMOTHORAX;  Surgeon: Ivin Poot, MD;  Location: [place];  Service: Thoracic;  Laterality: Left;  . RADICAL RETROPUBIC PROSTATECTOMY W/ BILATERAL PELVIC LYMPH NODE DISSECTION  11-28-2002   dr Mattie Marlin  [facility]  . TONSILLECTOMY    . TOTAL HIP ARTHROPLASTY Left 05/12/2016   Procedure: LEFT TOTAL HIP ARTHROPLASTY ANTERIOR APPROACH;  Surgeon: Paralee Cancel, MD;  Location: [facility];  Service:  Orthopedics;  Laterality: Left;  . TOTAL HIP ARTHROPLASTY Right 07-15-2006   dr Alvan Dame  [facility]  . TRANSTHORACIC ECHOCARDIOGRAM  07/20/2017   mild LVH, ef 45-50%, hypokinesis of the basal-midinferior myocardium, due to AFib unable to evaluate diastolic function/  severe LAE and RAE/  trivial PR and TR  . VIDEO ASSISTED THORACOSCOPY Left 11/30/2017   Procedure: VIDEO ASSISTED THORACOSCOPY;  Surgeon: Ivin Poot, MD;  Location: [place];  Service: Thoracic;  Laterality: Left;  [name] VIDEO ASSISTED THORACOSCOPY (VATS)/EMPYEMA Left 11/29/2017   Procedure: VIDEO ASSISTED THORACOSCOPY (VATS)/EMPYEMA;  Surgeon: Ivin Poot, MD;  Location: [place];  Service: Thoracic;  Laterality: Left;   Social History:  reports that he quit smoking about 43 years ago. His smoking use included pipe and cigars. He quit after 25.00  years of use. He has never used smokeless tobacco. He reports current alcohol use. He reports that he does not use drugs.  Family / Support Systems Marital Status: Married How Long?: 78 years (second marriage) Patient Roles: Spouse, Parent Spouse/Significant Other: Oscar Baker - wife - [phone] (h/m); [phone] (w) Children: both pt and wife have children from their previous marriages Anticipated Caregiver: wife, Oscar Baker Ability/Limitations of Caregiver: supervision only Caregiver Availability: 24/7 Family Dynamics: Pt reports supportive family.  CSW to confirm plan with wife.  Social History Preferred language: English Religion: Presbyterian Read: Yes Write: Yes Employment Status: Retired Public relations account executive Issues: none reported Guardian/Conservator: N/A - MD has determined that pt is capable of making his own decisions.   Abuse/Neglect Abuse/Neglect Assessment Can Be Completed: Yes Physical Abuse: Denies Verbal Abuse: Denies Sexual Abuse: Denies Exploitation of patient/patient's resources: Denies Self-Neglect: Denies  Emotional Status Pt's affect, behavior and adjustment status: Pt was talkative with CSW, when he could hear what CSW was saying.  He reports feeling positive about his recovery and is glad he is on Rehab Unit. Recent Psychosocial Issues: Pt with some memory deficits PTA and needed help with to remember to take medications. Psychiatric History: none reported Substance Abuse History: none reported  Patient / Family Perceptions, Expectations & Goals Pt/Family understanding of illness & functional limitations: Pt has a fair understanding of his condition.  CSW will f/u with pt's wife to make sure she has a good understanding. Premorbid pt/family roles/activities: Pt only went out occasionally for errands and appointments. Anticipated changes in roles/activities/participation: Pt hopes to resume activities as he is able. Pt/family  expectations/goals: Pt wants to get stronger and get home.  Community [employer] Agencies: None Premorbid Home Care/DME Agencies: Other (Comment)(Pt reports having lots of DME at home - walkers, canes, BSC, etc.  CSW to verify with pt's wife.) Transportation available at discharge: family  Discharge Planning Living Arrangements: Spouse/significant other Support Systems: Spouse/significant other, Children, Other relatives, Friends/neighbors Type of Residence: Private residence Insurance Resources: [employer], Multimedia programmer (specify)(Blue [employer] supplement) Financial Resources: Radio broadcast assistant Screen Referred: No Money Management: Patient, Spouse Does the patient have any problems obtaining your medications?: No Home Management: CSW to follow up with wife about this. Patient/Family Preliminary Plans: Pt plans to return home where his wife will provide supervision for pt. Social Work Anticipated Follow Up Needs: HH/OP Expected length of stay: 2 1/2 to 3 weeks  Clinical Impression CSW met with pt to introduce self and role of CSW, as well as to complete assessment.  Pt was talkative with CSW and making jokes with CSW.  Pt's wife visits during therapy and CSW will come to  meet her tomorrow.  Pt is grateful to be on CIR.  CSW will confirm that pt will have care at home.  CSW will continue to follow and assist as needed.  Oscar Baker, Silvestre Mesi 03/21/2019, 10:24 PM

## 2019-03-21 NOTE — Progress Notes (Signed)
Oscar Baker for Coumadin Indication: atrial fibrillation  Allergies  Allergen Reactions  . Altace [Ramipril] Other (See Comments)    "throat felt like had a knot in it"  . Augmentin [Amoxicillin-Pot Clavulanate] Diarrhea    severe  . Codeine Nausea And Vomiting    Nausea and vomiting   . Simvastatin Other (See Comments)    Leg aches    Patient Measurements: Height: 5' 5.98" (167.6 cm) Weight: 203 lb 7.8 oz (92.3 kg) IBW/kg (Calculated) : 63.75  Vital Signs: Temp: 97.8 F (36.6 C) (09/29 0516) BP: 106/44 (09/29 0516) Pulse Rate: 66 (09/29 0516)  Labs: Recent Labs    03/19/19 0338 03/20/19 0327 03/21/19 0520  HGB 8.5*  --   --   HCT 25.6*  --   --   PLT 464*  --   --   LABPROT 22.2* 22.3* 22.4*  INR 2.0* 2.0* 2.0*  CREATININE  --   --  0.94    Estimated Creatinine Clearance: 62.2 mL/min (by C-G formula based on SCr of 0.94 mg/dL).   Assessment: Patient on Warfarin for atrial fibrillation. PTA Warfarin dosing: 5 mg days except 7.5 mg on Tues and Sat, last dose 9/17 PTA. Resumed Warfarin on 9/23 post THA surgery. INR therapeutic at 2.0 today. No bleeding noted.   Goal of Therapy:  INR 2-3 Monitor platelets by anticoagulation protocol: Yes   Plan:  Warfarin 7.5mg  PO x 1 tonight Daily INR  Salome Arnt, PharmD, BCPS Please see AMION for all pharmacy numbers 03/21/2019 8:39 AM

## 2019-03-21 NOTE — Progress Notes (Signed)
Occupational Therapy Session Note  Patient Details  Name: Oscar Baker MRN: [id number] Date of Birth: 1932/10/11  Today's Date: 03/21/2019 OT Individual Time: 1355-1425 OT Individual Time Calculation (min): 30 min    Short Term Goals: Week 1:  OT Short Term Goal 1 (Week 1): Pt will complete sit > stand as needed for LB self-care with mod assist of one caregiver OT Short Term Goal 2 (Week 1): Pt will complete bathing with mod assist of one caregiver OT Short Term Goal 3 (Week 1): Pt will complete LB dressing with mod assist of one caregiver with AE as needed OT Short Term Goal 4 (Week 1): Pt will complete toilet transfer with max assist of one caregiver  Skilled Therapeutic Interventions/Progress Updates:  Pt sitting on BSC upon arrival with RN and NT present.  Pt required max A+2 for sit<>stand for clothing management and to remain standing for placement of w/c. Pt able to reposition self in w/c to allow for placement of elevating leg rest.  Focus on review of POC, purpose of OT, therapy schedule, and safety precautions. Pt declined attempting sit<>stand again. Pt's wife present during session.  Pt remained seated in w/c with RN present.   Therapy Documentation Precautions:  Precautions Precautions: Fall Restrictions Weight Bearing Restrictions: Yes RLE Weight Bearing: Partial weight bearing RLE Partial Weight Bearing Percentage or Pounds: 50  Pain: Pain Assessment Pain Scale: 0-10 Pain Score: 7  Pain Type: Surgical pain Pain Location: Hip Pain Orientation: Right Pain Descriptors / Indicators: Throbbing Pain Onset: With Activity Patients Stated Pain Goal: 3 Pain Intervention(s): RN admin meds at end of session; repositioned  Therapy/Group: Individual Therapy  [name] 03/21/2019, 2:58 PM

## 2019-03-21 NOTE — Progress Notes (Signed)
[facility] Individual Statement of Services  Patient Name:  Oscar Baker  Date:  03/21/2019  Welcome to the [facility].  Our goal is to provide you with an individualized program based on your diagnosis and situation, designed to meet your specific needs.  With this comprehensive rehabilitation program, you will be expected to participate in at least 3 hours of rehabilitation therapies Monday-Friday, with modified therapy programming on the weekends.  Your rehabilitation program will include the following services:  Physical Therapy (PT), Occupational Therapy (OT), 24 hour per day rehabilitation nursing, Case Management (Social Worker), Rehabilitation Medicine, Nutrition Services and Pharmacy Services  Weekly team conferences will be held on Wednesdays to discuss your progress.  Your Social Worker will talk with you frequently to get your input and to update you on team discussions.  Team conferences with you and your family in attendance may also be held.  Expected length of stay:  2 1/2 to 3 weeks  Overall anticipated outcome:  Supervision and minimal assistance for ambulation  Depending on your progress and recovery, your program may change. Your Social Worker will coordinate services and will keep you informed of any changes. Your Social Worker's name and contact numbers are listed  below.  The following services may also be recommended but are not provided by the [facility] will be made to provide these services after discharge if needed.  Arrangements include referral to agencies that provide these services.  Your insurance has been verified to be:  Medicare and [employer] supplement Your primary doctor is:  Dr. [name]  Pertinent information will be shared with your doctor and your  insurance company.  Social Worker:  Alfonse Alpers, LCSW  [phone] or ([phone]  Information discussed with and copy given to patient by: Trey Sailors, 03/21/2019, 10:36 PM

## 2019-03-21 NOTE — Progress Notes (Signed)
Patient information reviewed and entered into eRehab System by Becky Lissette Schenk, PPS coordinator. Information including medical coding, function ability, and quality indicators will be reviewed and updated through discharge.   

## 2019-03-22 ENCOUNTER — Inpatient Hospital Stay (HOSPITAL_COMMUNITY): Payer: Medicare Other

## 2019-03-22 ENCOUNTER — Inpatient Hospital Stay (HOSPITAL_COMMUNITY): Payer: Medicare Other | Admitting: Occupational Therapy

## 2019-03-22 DIAGNOSIS — I480 Paroxysmal atrial fibrillation: Secondary | ICD-10-CM

## 2019-03-22 LAB — GLUCOSE, CAPILLARY
Glucose-Capillary: 77 mg/dL (ref 70–99)
Glucose-Capillary: 101 mg/dL — ABNORMAL HIGH (ref 70–99)
Glucose-Capillary: 160 mg/dL — ABNORMAL HIGH (ref 70–99)
Glucose-Capillary: 114 mg/dL — ABNORMAL HIGH (ref 70–99)

## 2019-03-22 LAB — PATHOLOGIST SMEAR REVIEW

## 2019-03-22 LAB — PROTIME-INR
INR: 2.5 — ABNORMAL HIGH (ref 0.8–1.2)
Prothrombin Time: 26.2 seconds — ABNORMAL HIGH (ref 11.4–15.2)

## 2019-03-22 MED ORDER — POLYETHYLENE GLYCOL 3350 17 G PO PACK
17.0000 g | PACK | Freq: Two times a day (BID) | ORAL | Status: DC
  Administered 2019-03-22 – 2019-03-23 (×2): 17 g via ORAL
  Filled 2019-03-22 (×3): qty 1

## 2019-03-22 MED ORDER — WARFARIN SODIUM 5 MG PO TABS
5.0000 mg | ORAL_TABLET | Freq: Once | ORAL | Status: AC
  Administered 2019-03-22: 5 mg via ORAL
  Filled 2019-03-22: qty 1

## 2019-03-22 NOTE — Progress Notes (Signed)
[facility] PHYSICAL MEDICINE & REHABILITATION PROGRESS NOTE   Subjective/Complaints:  Pt reports had been constipated 2-3 days ago- was again last night- wants to change bowel meds to help prevent again- very painful.  INR 2.5 Stool was very soft, too soft, but couldn't go "forever". So need to stop Colace, increase laxatives  Objective:   No results found. Recent Labs    03/21/19 0520  WBC 41.9*  HGB 9.4*  HCT 28.3*  PLT PLATELET CLUMPS NOTED ON SMEAR, UNABLE TO ESTIMATE   Recent Labs    03/21/19 0520  NA 135  K 4.4  CL 101  CO2 27  GLUCOSE 137*  BUN 21  CREATININE 0.94  CALCIUM 8.4*    Intake/Output Summary (Last 24 hours) at 03/22/2019 1432 Last data filed at 03/22/2019 1422 Gross per 24 hour  Intake 400 ml  Output 2925 ml  Net -2525 ml     Physical Exam: Vital Signs Blood pressure (!) 111/57, pulse 68, temperature 98 F (36.7 C), temperature source Oral, resp. rate 17, height 5' 5.98" (1.676 m), weight 93 kg, SpO2 96 %.   Physical Exam Nursing note, labs, and vitalsreviewed. Constitutional:  Awake, alert, appropriate, very HOH, nurse at bedside, lying in bed, NAD HENT:  Head:Macrocephalic. Healing abrasion on left cheek- almost healed CV- regular rate; IRR Respiratory:Effort normal. congested cough noted improved this AM.coarse breath sounds, but otherwise good air movement B/L GI: He exhibitsno distension. There isno abdominal tenderness.  (+) BS Musculoskeletal:  Strength- 5/5 in UEs and B/L LLE HF 3/5, otherwise 4+/5 distally in KE/KF/DF and PF RLE- HF 1/5 due to pain; distally 4+/5 in KE/KF/DF and PF Comments: Right flank with very large hematoma that's purple; a smaller bruise but larger hematoma on R lateral mid back with protrusion of skin by hematoma.  Right hip incision with dry dressing. Neurological: He is alertand oriented to person, place, and time.  Skin: Skin iswarmand dry. wearing condom  catheter   Assessment/Plan: 1. Functional deficits secondary to R periprosthetic hip fracture due to ground level fall and poor balance which require 3+ hours per day of interdisciplinary therapy in a comprehensive inpatient rehab setting.  Physiatrist is providing close team supervision and 24 hour management of active medical problems listed below.  Physiatrist and rehab team continue to assess barriers to discharge/monitor patient progress toward functional and medical goals  Care Tool:  Bathing    Body parts bathed by patient: Right arm, Left arm, Chest, Abdomen, Front perineal area, Right upper leg, Left upper leg, Face   Body parts bathed by helper: Right lower leg, Left lower leg, Buttocks     Bathing assist Assist Level: 2 Helpers     Upper Body Dressing/Undressing Upper body dressing   What is the patient wearing?: Pull over shirt    Upper body assist Assist Level: Minimal Assistance - Patient > 75%    Lower Body Dressing/Undressing Lower body dressing      What is the patient wearing?: Pants, Underwear/pull up     Lower body assist Assist for lower body dressing: 2 Helpers     Toileting Toileting    Toileting assist Assist for toileting: 2 Helpers     Transfers Chair/bed transfer  Transfers assist     Chair/bed transfer assist level: 2 Helpers Chair/bed transfer assistive device: Programmer, multimedia   Ambulation assist   Ambulation activity did not occur: Safety/medical concerns(increased pain/decreased activity tolerance)          Walk  10 feet activity   Assist  Walk 10 feet activity did not occur: Safety/medical concerns(increased pain/decreased activity tolerance)        Walk 50 feet activity   Assist Walk 50 feet with 2 turns activity did not occur: Safety/medical concerns(increased pain/decreased activity tolerance)         Walk 150 feet activity   Assist Walk 150 feet activity did not occur:  Safety/medical concerns(increased pain/decreased activity tolerance)         Walk 10 feet on uneven surface  activity   Assist Walk 10 feet on uneven surfaces activity did not occur: Safety/medical concerns(increased pain/decreased activity tolerance)         Wheelchair     Assist   Type of Wheelchair: Manual    Wheelchair assist level: Dependent - Patient 0%(Patient with increased pain/decreased activity tolerance)      Wheelchair 50 feet with 2 turns activity    Assist    Wheelchair 50 feet with 2 turns activity did not occur: Safety/medical concerns(increased pain/decreased activity tolerance)       Wheelchair 150 feet activity     Assist  Wheelchair 150 feet activity did not occur: Safety/medical concerns(increased pain/decreased activity tolerance)       Blood pressure (!) 111/57, pulse 68, temperature 98 F (36.7 C), temperature source Oral, resp. rate 17, height 5' 5.98" (1.676 m), weight 93 kg, SpO2 96 %.    Medical Problem List and Plan: 1. R hip periprosthetic fracture s/p ORIF secondary to ground level fall at home due to poor balance.            -will need 15/7 for therapy due to poor movement. 2. Antithrombotics: -DVT/anticoagulation:Pharmaceutical:Coumadin -antiplatelet therapy: on coumadin for Afib- INR 2.0  9/30- INR 2.5- managed by pharmacy 3. Pain Management:continue gabapentin tid for neuropathy and Norco 7/5/325 mg q4 hours prn for pain  9/29- pain doing OK per pt currently- might need increase in Norco dose, possibly- will monitor 4. Mood:LCSW to follow for evaluation and support. -antipsychotic agents: N/A 5. Neuropsych: This patient is capable of making decisions on his own behalf. 6. Skin/Wound Care:Monitor wound for healing. Continue nutritional supplement to promote healing.            -did entire body skin check- has 2 large hematomas and a R hip incision- will need kinesiotaping for 1 of  hematomas 7. Fluids/Electrolytes/Nutrition:Monitor I/O. Check lytes in am. 8. Chronic A FIB: Monitor HR tid--continue coreg. Monitor for orthostatic symptoms.  9/29- INR 2.0- will d/c IV 9. CLL: WBC trending back down- is normally in 20s.   9/29- stable at 41k- will con't to monitor- if contues to increase, will call Heme/onc. 10 ABLA: Continue to monitor H/H--stabilizing around 8. On iron supplement.  11. T2DM: On lantus 35 units daily with SSI for elevated BS. Monitor BS ac/hs and titrate lantus as indicated.   9/29- BGs adequate control for now- will monitor  CBG (last 3)  Recent Labs    03/21/19 2110 03/22/19 0645 03/22/19 1130  GLUCAP 90 77 101*    12.Hyponatremia: Will check follow up labs in am.  9/29- labs normalized 13. Chronic cough (due to chronic aspiration): per records has refused dysphagia diet. On duonebs tid. On Neurontin for cough? Keep HOB>30 degrees 14. Constipation due to opioids- will schedule Senokot S 2 tabs BID and use prns as necessary,  9/30- will d/c Colace, increase miralax ot BID, and con't Senokot S 2 tabs BID 15. Poor balance            -  will have vestibular PT evaluation done and see if can be helped at all. Also told pt to drink a lot- he doesn't like to because condom cath.  9/29- d/w PT- she will make sure it's done.    LOS: 2 days A FACE TO FACE EVALUATION WAS PERFORMED  Escher Harr 03/22/2019, 2:32 PM

## 2019-03-22 NOTE — Plan of Care (Signed)
  Problem: Consults Goal: RH GENERAL PATIENT EDUCATION Description: See Patient Education module for education specifics. Outcome: Progressing Goal: Skin Care Protocol Initiated - if Braden Score 18 or less Description: If consults are not indicated, leave blank or document N/A Outcome: Progressing Goal: Diabetes Guidelines if Diabetic/Glucose > 140 Description: If diabetic or lab glucose is > 140 mg/dl - Initiate Diabetes/Hyperglycemia Guidelines & Document Interventions  Outcome: Progressing   Problem: RH BOWEL ELIMINATION Goal: RH STG MANAGE BOWEL W/MEDICATION W/ASSISTANCE Description: STG Manage Bowel with Medication with min Assistance. Outcome: Progressing   Problem: RH SKIN INTEGRITY Goal: RH STG ABLE TO PERFORM INCISION/WOUND CARE W/ASSISTANCE Description: STG Able To Perform Incision/Wound Care With total Assistance from caregiver. Outcome: Progressing   Problem: RH SAFETY Goal: RH STG ADHERE TO SAFETY PRECAUTIONS W/ASSISTANCE/DEVICE Description: STG Adhere to Safety Precautions With supervision Assistance/Device. Outcome: Progressing   Problem: RH PAIN MANAGEMENT Goal: RH STG PAIN MANAGED AT OR BELOW PT'S PAIN GOAL Description: < 4 Outcome: Progressing   Problem: RH BOWEL ELIMINATION Goal: RH STG MANAGE BOWEL WITH ASSISTANCE Description: STG Manage Bowel with min Assistance. Outcome: Not Progressing   Problem: RH SKIN INTEGRITY Goal: RH STG SKIN FREE OF INFECTION/BREAKDOWN Description: Patients skin will remain free from further infection or breakdown with mod assist. Outcome: Not Progressing Goal: RH STG MAINTAIN SKIN INTEGRITY WITH ASSISTANCE Description: STG Maintain Skin Integrity With mod Assistance. Outcome: Not Progressing    Required disimpaction for bowel elimination.  Needs max/total assist for repositioning for skin integrity.

## 2019-03-22 NOTE — Progress Notes (Signed)
Physical Therapy Session Note  Patient Details  Name: Oscar Baker MRN: [id number] Date of Birth: May 08, 1933  Today's Date: 03/22/2019 PT Individual Time: 0915-1005 PT Individual Time Calculation (min): 50 min   Short Term Goals: Week 1:  PT Short Term Goal 1 (Week 1): Patient will perform basic transfer using LRAD consistenty with mod A of 1 person. PT Short Term Goal 2 (Week 1): Patient will perform sit<>supine with mod A. PT Short Term Goal 3 (Week 1): Patient will propel w/c >50 feet.  Skilled Therapeutic Interventions/Progress Updates:     Patient in bed with his wife at bedside upon PT arrival. Patient alert and agreeable to PT session. Patient denied pain when lying in the bed, but reported increased pain with mobility, tolerated better than yesterday with this therapist. Reported that he was pre-medicated piror to session. PT provided repositioning, rest breaks, and distraction as pain interventions throughout session. Patient requested to don his personal external catheter and was insistant upon using his prior to donning pants. PT doffed condom catheter and provided supervision as patient and his wife donned his catheter with patient sitting EOB during session, RN made aware.   Therapeutic Activity: Bed Mobility: Patient performed supine to sit with min A for LE and trunk management with HOB elevated and use of bed rails. Provided verbal cues for sliding B LEs off the bed then pushing to sit up. Requried mod A for scooting hips forward to sit EOB. Patient sat EOB for ~10 min working on sitting balance and strengthening of trunk musculature during a functional activity while donning his personal catheter, see above. Transfers: Patient attempted sit to/form stand x1 with max A of 1 person and reported increased R hip pain and was unable to come to standing. PT instructed on placing R foot forward to off-weight his R LE in standing and he performed sit to/from stand x1 and stand pivot x1  with mod A+2 using A RW. Provided verbal cues for using UEs to off-weight R LE and pivot technique.  Therapeutic Exercise: Patient performed the following exercises with verbal and tactile cues for proper technique. -Heel slides x10 with mod A -SLR x5 with mod A with increased time and rest breaks.   Patient in w/c at end of session with breaks locked, seat belt alarm set, and all needs within reach.    Therapy Documentation Precautions:  Precautions Precautions: Fall Restrictions Weight Bearing Restrictions: Yes RLE Weight Bearing: Partial weight bearing RLE Partial Weight Bearing Percentage or Pounds: 50    Therapy/Group: Individual Therapy  Doreene Burke PT, DPT' 03/22/2019, 12:57 PM

## 2019-03-22 NOTE — Progress Notes (Signed)
Occupational Therapy Session Note  Patient Details  Name: Oscar Baker MRN: [id number] Date of Birth: Feb 28, 1933  Today's Date: 03/22/2019 OT Individual Time: 1030-1110 OT Individual Time Calculation (min): 40 min    Short Term Goals: Week 1:  OT Short Term Goal 1 (Week 1): Pt will complete sit > stand as needed for LB self-care with mod assist of one caregiver OT Short Term Goal 2 (Week 1): Pt will complete bathing with mod assist of one caregiver OT Short Term Goal 3 (Week 1): Pt will complete LB dressing with mod assist of one caregiver with AE as needed OT Short Term Goal 4 (Week 1): Pt will complete toilet transfer with max assist of one caregiver  Skilled Therapeutic Interventions/Progress Updates:    Pt resting in w/c upon arrival with wife present.  Pt requested use of toilet.  Pt required max A +2 for toilet transfer (sit<>stand while +2 places BSC) and toileting tasks.  Discussed discharge date and LTG/POC. Pt able to empty catheter bag without assistance. Pt performed sit<>stand X 4 with max A +2. Pt remained seated in w/c with belt alarm activated and wife present.  All needs within reach.   Therapy Documentation Precautions:  Precautions Precautions: Fall Restrictions Weight Bearing Restrictions: Yes RLE Weight Bearing: Partial weight bearing RLE Partial Weight Bearing Percentage or Pounds: 50  Pain: Pt c/o increased R hip pain with movement; repositioned   Therapy/Group: Individual Therapy  [name] 03/22/2019, 12:53 PM

## 2019-03-22 NOTE — Progress Notes (Signed)
Occupational Therapy Session Note  Patient Details  Name: Oscar Baker MRN: [id number] Date of Birth: 1932/11/17  Today's Date: 03/22/2019 OT Individual Time: 1302-1405 OT Individual Time Calculation (min): 63 min    Short Term Goals: Week 1:  OT Short Term Goal 1 (Week 1): Pt will complete sit > stand as needed for LB self-care with mod assist of one caregiver OT Short Term Goal 2 (Week 1): Pt will complete bathing with mod assist of one caregiver OT Short Term Goal 3 (Week 1): Pt will complete LB dressing with mod assist of one caregiver with AE as needed OT Short Term Goal 4 (Week 1): Pt will complete toilet transfer with max assist of one caregiver  Skilled Therapeutic Interventions/Progress Updates:    Pt seen for OT treatment to further eval vestibular issues.  Pt up in wheelchair at start of session with spouse present.  Discussed current situation from fall as well as prior falls.  Pt reports no history of dizziness, lightheadedness, or disorientation with this fall or previous falls.  He did state that he had a right ear infection prior to his first fall which resulted in loss of hearing on the right side, but no other symptoms that he could state.  Pt reports no current dizziness or uneasiness with change in positions such as rolling, supine to sit, sit to supine, or sit to stand when completed in therapies or PTA.  Had pt complete visual tracking of object which he was able to follow in all directions.  Noted slight jerky movement with all tracking as well as gaze evoked nystagmus when he held gaze in all directions laterally to both sides (superior, inferior, and medial).  Nystagmus was vertical/rotational when holding gaze up and lateral as well as horizontal when looking to the sides and inferiorly.  No direction or side was not noted to be worse.  Even with the nystagmus, pt did not report any dizziness or discomfort with any of the tracking completed.  Convergence was normal as well  as saccadic movements.  Head thrust test was also negative for dysfunction.  Pt did exhibit some difficulty with VOR testing when asked to maintain focus on a stationary object and complete head movements up and down.  His eyes demonstrated difficulty maintaining fixation on the target and coordinating head movements.  He performed better with gaze at target and head turns left to right.  Based on pt's lack of symptoms with positional changes, no reported dizziness, and his recent right hip fx with increased pain, this therapist did not attempt any positional based testing from bed level.  Feel if horizontal, anterior, and posterior canals were tested, they would likely be negative based on his report of no symptoms prior to this or since any falls.  In the unlikely event that they would be positive, pt would not tolerate repositioning maneuvers at this time secondary to hip fx.  Based on testing there was no clear cut diagnosis that could be identified.  Pt does not exhibit symptoms that are consistent with central vestibular disorder in addition he has no history of CVA or BI that has been confirmed.  His symptoms also do not fit BPPV either.  There could possible be a link to his right ear infection from years ago resulting in hearing loss and possible prolonged damage to the inner ear, but this is still not clear.  Recommended gaze stabilization exercises for VOR dysfunction with demonstration and handout provided.  Feel this in  conjunction with static and dynamic balance exercises will help to improve his function, but this will also be limited by his current orthopedic issues.  Pt left with spouse in room and pt still up in wheelchair with call button and phone in reach.    Therapy Documentation Precautions:  Precautions Precautions: Fall Restrictions Weight Bearing Restrictions: Yes RLE Weight Bearing: Partial weight bearing RLE Partial Weight Bearing Percentage or Pounds: 50  Pain: Pain  Assessment Pain Scale: 0-10 Pain Score: 4 (had to stand to empty bladder which causes pain) Faces Pain Scale: Hurts little more Pain Type: Surgical pain Pain Location: Hip Pain Orientation: Right Pain Descriptors / Indicators: Aching Pain Onset: With Activity Patients Stated Pain Goal: 2 Pain Intervention(s): Medication (See eMAR)   Therapy/Group: Individual Therapy  Dallis Darden OTR/L 03/22/2019, 4:08 PM

## 2019-03-22 NOTE — Progress Notes (Signed)
ANTICOAGULATION CONSULT NOTE  Pharmacy Consult for Coumadin Indication: atrial fibrillation  Patient Measurements: Height: 5' 5.98" (167.6 cm) Weight: 205 lb 0.4 oz (93 kg) IBW/kg (Calculated) : 63.75  Vital Signs: Temp: 98.2 F (36.8 C) (09/30 0512) Temp Source: Oral (09/30 0512) BP: 103/58 (09/30 0512) Pulse Rate: 68 (09/30 0512)  Labs: Recent Labs    03/20/19 0327 03/21/19 0520 03/22/19 0506  HGB  --  9.4*  --   HCT  --  28.3*  --   PLT  --  PLATELET CLUMPS NOTED ON SMEAR, UNABLE TO ESTIMATE  --   LABPROT 22.3* 22.4* 26.2*  INR 2.0* 2.0* 2.5*  CREATININE  --  0.94  --     Estimated Creatinine Clearance: 62.5 mL/min (by C-G formula based on SCr of 0.94 mg/dL).   Assessment: Patient on Warfarin for atrial fibrillation. PTA Warfarin dosing: 5 mg days except 7.5 mg on Tues and Sat, last dose 9/17 PTA. Resumed Warfarin on 9/23 post THA surgery. INR therapeutic at 2.5 today. No bleeding noted.   Goal of Therapy:  INR 2-3 Monitor platelets by anticoagulation protocol: Yes   Plan:  Warfarin 5mg  PO x 1 tonight - normal home dose Daily INR  Salome Arnt, PharmD, BCPS Please see AMION for all pharmacy numbers 03/22/2019 7:58 AM

## 2019-03-23 ENCOUNTER — Inpatient Hospital Stay (HOSPITAL_COMMUNITY): Payer: Medicare Other

## 2019-03-23 LAB — CBC WITH DIFFERENTIAL/PLATELET
Abs Immature Granulocytes: 0.42 10*3/uL — ABNORMAL HIGH (ref 0.00–0.07)
Basophils Absolute: 0.1 10*3/uL (ref 0.0–0.1)
Basophils Relative: 0 %
Eosinophils Absolute: 0.3 10*3/uL (ref 0.0–0.5)
Eosinophils Relative: 1 %
HCT: 29.5 % — ABNORMAL LOW (ref 39.0–52.0)
Hemoglobin: 9.4 g/dL — ABNORMAL LOW (ref 13.0–17.0)
Immature Granulocytes: 1 %
Lymphocytes Relative: 75 %
Lymphs Abs: 30.4 10*3/uL — ABNORMAL HIGH (ref 0.7–4.0)
MCH: 32.4 pg (ref 26.0–34.0)
MCHC: 31.9 g/dL (ref 30.0–36.0)
MCV: 101.7 fL — ABNORMAL HIGH (ref 80.0–100.0)
Monocytes Absolute: 2.4 10*3/uL — ABNORMAL HIGH (ref 0.1–1.0)
Monocytes Relative: 6 %
Neutro Abs: 6.7 10*3/uL (ref 1.7–7.7)
Neutrophils Relative %: 17 %
Platelets: 594 10*3/uL — ABNORMAL HIGH (ref 150–400)
RBC: 2.9 MIL/uL — ABNORMAL LOW (ref 4.22–5.81)
RDW: 20.1 % — ABNORMAL HIGH (ref 11.5–15.5)
WBC: 40.3 10*3/uL — ABNORMAL HIGH (ref 4.0–10.5)
nRBC: 0.1 % (ref 0.0–0.2)

## 2019-03-23 LAB — COMPREHENSIVE METABOLIC PANEL
ALT: 13 U/L (ref 0–44)
AST: 27 U/L (ref 15–41)
Albumin: 2.5 g/dL — ABNORMAL LOW (ref 3.5–5.0)
Alkaline Phosphatase: 70 U/L (ref 38–126)
Anion gap: 6 (ref 5–15)
BUN: 20 mg/dL (ref 8–23)
CO2: 27 mmol/L (ref 22–32)
Calcium: 8.2 mg/dL — ABNORMAL LOW (ref 8.9–10.3)
Chloride: 100 mmol/L (ref 98–111)
Creatinine, Ser: 1.03 mg/dL (ref 0.61–1.24)
GFR calc Af Amer: >60 mL/min (ref >60)
GFR calc non Af Amer: >60 mL/min (ref >60)
Glucose, Bld: 106 mg/dL — ABNORMAL HIGH (ref 70–99)
Potassium: 4.4 mmol/L (ref 3.5–5.1)
Sodium: 133 mmol/L — ABNORMAL LOW (ref 135–145)
Total Bilirubin: 1 mg/dL (ref 0.3–1.2)
Total Protein: 5.3 g/dL — ABNORMAL LOW (ref 6.5–8.1)

## 2019-03-23 LAB — GLUCOSE, CAPILLARY
Glucose-Capillary: 93 mg/dL (ref 70–99)
Glucose-Capillary: 163 mg/dL — ABNORMAL HIGH (ref 70–99)
Glucose-Capillary: 123 mg/dL — ABNORMAL HIGH (ref 70–99)
Glucose-Capillary: 157 mg/dL — ABNORMAL HIGH (ref 70–99)

## 2019-03-23 LAB — PROTIME-INR
INR: 3.2 — ABNORMAL HIGH (ref 0.8–1.2)
Prothrombin Time: 32.1 seconds — ABNORMAL HIGH (ref 11.4–15.2)

## 2019-03-23 MED ORDER — MAGNESIUM CITRATE PO SOLN
1.0000 | Freq: Once | ORAL | Status: DC
  Filled 2019-03-23: qty 296

## 2019-03-23 NOTE — Progress Notes (Signed)
ANTICOAGULATION CONSULT NOTE  Pharmacy Consult for Coumadin Indication: atrial fibrillation  Patient Measurements: Height: 5' 5.98" (167.6 cm) Weight: 207 lb 7.3 oz (94.1 kg) IBW/kg (Calculated) : 63.75  Vital Signs: Temp: 97.6 F (36.4 C) (10/01 0339) Temp Source: Oral (10/01 0339) BP: 107/61 (10/01 0339) Pulse Rate: 73 (10/01 0339)  Labs: Recent Labs    03/21/19 0520 03/22/19 0506 03/23/19 0635  HGB 9.4*  --  9.4*  HCT 28.3*  --  29.5*  PLT PLATELET CLUMPS NOTED ON SMEAR, UNABLE TO ESTIMATE  --  594*  LABPROT 22.4* 26.2* 32.1*  INR 2.0* 2.5* 3.2*  CREATININE 0.94  --  1.03    Estimated Creatinine Clearance: 57.3 mL/min (by C-G formula based on SCr of 1.03 mg/dL).   Assessment: Patient on Warfarin for atrial fibrillation. PTA Warfarin dosing: 5 mg daily except 7.5 mg on Tues and Sat, last dose 9/17 PTA. Resumed Warfarin on 9/23 post THA surgery. INR therapeutic at 2.5 today. No bleeding noted.   Goal of Therapy:  INR 2-3 Monitor platelets by anticoagulation protocol: Yes   Plan:  - INR increased by 0.7 overnight  - With this increase will hold Warfarin tonight - Will continue daily INR  Salome Arnt, PharmD, BCPS Please see AMION for all pharmacy numbers 03/23/2019 7:47 AM

## 2019-03-23 NOTE — Progress Notes (Signed)
Physical Therapy Session Note  Patient Details  Name: Oscar Baker MRN: [id number] Date of Birth: 1933-04-22  Today's Date: 03/23/2019 PT Individual Time: 1005-1050 PT Individual Time Calculation (min): 45 min   Short Term Goals: Week 1:  PT Short Term Goal 1 (Week 1): Patient will perform basic transfer using LRAD consistenty with mod A of 1 person. PT Short Term Goal 2 (Week 1): Patient will perform sit<>supine with mod A. PT Short Term Goal 3 (Week 1): Patient will propel w/c >50 feet.  Skilled Therapeutic Interventions/Progress Updates:  Pt seated in w/c, gasping in pain due to rectum (loose stools today) and open area R buttock. Pt rated pain of rectum 4/10.  Attempted sit> stand with +2 and RW, but pt unable to elevate hips at all.  PT reviewed PWBing restrictions for RLE.  Pt stated "50#" rather than 50% wt bearing.  Slide board transfer +2 to L with pt assisting.  Mod assist for sit> supine.  Pt positioned comfortably with pillow under RLE, heel off of pillow.  PT instructed pt in R hip internal/external rotation, bil ankle pumps, and R/L scapular protraction punches with extended elbows, x 5 each.  Pt reported bowel incontinence with q movement.  At end of session, pt in bed, with Jiles Garter, RN entering.        Therapy Documentation Precautions:  Precautions Precautions: Fall Restrictions Weight Bearing Restrictions: Yes RLE Weight Bearing: Partial weight bearing RLE Partial Weight Bearing Percentage or Pounds: 50       Therapy/Group: Individual Therapy  Oscar Baker 03/23/2019, 10:59 AM

## 2019-03-23 NOTE — Progress Notes (Signed)
Patient given scheduled senekot, and miralax. Also given prune juice and encouraged to drink water to help with bowels. Patient requesting enemas and digital stimulation. Patient has 4 documented bowels within the last 24 hours. Educated about scheduled stool softeners and increasing fluid intake. Will continue to monitor bowel movements.

## 2019-03-23 NOTE — Progress Notes (Signed)
Occupational Therapy Session Note  Patient Details  Name: Oscar Baker MRN: [id number] Date of Birth: [birth year]  Today's Date: 03/23/2019 OT Individual Time: 1300-1340 OT Individual Time Calculation (min): 40 min    Short Term Goals: Week 1:  OT Short Term Goal 1 (Week 1): Pt will complete sit > stand as needed for LB self-care with mod assist of one caregiver OT Short Term Goal 2 (Week 1): Pt will complete bathing with mod assist of one caregiver OT Short Term Goal 3 (Week 1): Pt will complete LB dressing with mod assist of one caregiver with AE as needed OT Short Term Goal 4 (Week 1): Pt will complete toilet transfer with max assist of one caregiver  Skilled Therapeutic Interventions/Progress Updates:    Pt resting in bed upon arrival.  Pt incontinent of bowel X 2 with loose feces.  OT intervention with focus on bed mobility-rolling R<>L with use of bed rails.  Pt requires min A for rolling with +2 holding RLE in safe position to avoid discomfort. Pt remained in bed with all needs within reach and bed alarm activated.  Wife present.   Therapy Documentation Precautions:  Precautions Precautions: Fall Restrictions Weight Bearing Restrictions: Yes RLE Weight Bearing: Partial weight bearing RLE Partial Weight Bearing Percentage or Pounds: 50 Pain:  Pt with increased pain during bed mobility; repositined   Therapy/Group: Individual Therapy  [name] 03/23/2019, 1:42 PM

## 2019-03-23 NOTE — Progress Notes (Signed)
Occupational Therapy Session Note  Patient Details  Name: Oscar Baker MRN: [id number] Date of Birth: May 13, 1933  Today's Date: 03/23/2019 OT Individual Time: 0800-0900 OT Individual Time Calculation (min): 60 min    Short Term Goals: Week 1:  OT Short Term Goal 1 (Week 1): Pt will complete sit > stand as needed for LB self-care with mod assist of one caregiver OT Short Term Goal 2 (Week 1): Pt will complete bathing with mod assist of one caregiver OT Short Term Goal 3 (Week 1): Pt will complete LB dressing with mod assist of one caregiver with AE as needed OT Short Term Goal 4 (Week 1): Pt will complete toilet transfer with max assist of one caregiver  Skilled Therapeutic Interventions/Progress Updates:    Pt resting in bed upon arrival with wife present. OT intervention with focus on bed mobility, sit<>stand, standing balance, stand pivot transfers, activity tolerance, and safety awareness (adherance to [facility]). Pt required max A for supine>sit EOB and maintained sitting balance with supervision.  Pt requested use of BSC and performed stand pivot transfer.  Max A +2 for sit>stand and min A for step/pivot to [employer]. Pt incontinent of bowel. Padded tub bench with cutout used for BSC. Pt's condom cath came off during transfer.  Pt requested that he use hospital condom cath vs home condom. Pt performed sit<>stand X4 during session with mod AX1 and max AX3. Pt required +2 for hygiene and toileting tasks.  Pt remained seated in w/c with incontinence brief only (per pt request). Belt alarm activated and wife present.  All needs within reach.   Therapy Documentation Precautions:  Precautions Precautions: Fall Restrictions Weight Bearing Restrictions: Yes RLE Weight Bearing: Partial weight bearing RLE Partial Weight Bearing Percentage or Pounds: 50   Pain:  "Not bad" but noted increased pain with transitional movements; repositioned   Therapy/Group: Individual Therapy  [name] 03/23/2019, 9:40 AM

## 2019-03-23 NOTE — Plan of Care (Signed)
  Problem: Consults Goal: RH GENERAL PATIENT EDUCATION Description: See Patient Education module for education specifics. Outcome: Progressing Goal: Skin Care Protocol Initiated - if Braden Score 18 or less Description: If consults are not indicated, leave blank or document N/A Outcome: Progressing Goal: Diabetes Guidelines if Diabetic/Glucose > 140 Description: If diabetic or lab glucose is > 140 mg/dl - Initiate Diabetes/Hyperglycemia Guidelines & Document Interventions  Outcome: Progressing   Problem: RH BOWEL ELIMINATION Goal: RH STG MANAGE BOWEL WITH ASSISTANCE Description: STG Manage Bowel with min Assistance. Outcome: Progressing Flowsheets (Taken 03/23/2019 1340) STG: Pt will manage bowels with assistance: 4-Minimum assistance Goal: RH STG MANAGE BOWEL W/MEDICATION W/ASSISTANCE Description: STG Manage Bowel with Medication with min Assistance. Outcome: Progressing Flowsheets (Taken 03/23/2019 1340) STG: Pt will manage bowels with medication with assistance: 4-Minimal assistance   Problem: RH SKIN INTEGRITY Goal: RH STG SKIN FREE OF INFECTION/BREAKDOWN Description: Patients skin will remain free from further infection or breakdown with mod assist. Outcome: Progressing Goal: RH STG MAINTAIN SKIN INTEGRITY WITH ASSISTANCE Description: STG Maintain Skin Integrity With mod Assistance. Outcome: Progressing Flowsheets (Taken 03/23/2019 1340) STG: Maintain skin integrity with assistance: 4-Minimal assistance Goal: RH STG ABLE TO PERFORM INCISION/WOUND CARE W/ASSISTANCE Description: STG Able To Perform Incision/Wound Care With total Assistance from caregiver. Outcome: Progressing Flowsheets (Taken 03/23/2019 1340) STG: Pt will be able to perform incision/wound care with assistance: 4-Minimal assistance   Problem: RH SAFETY Goal: RH STG ADHERE TO SAFETY PRECAUTIONS W/ASSISTANCE/DEVICE Description: STG Adhere to Safety Precautions With supervision Assistance/Device. Outcome:  Progressing Flowsheets (Taken 03/23/2019 1340) STG:Pt will adhere to safety precautions with assistance/device: 3-Moderate assistance   Problem: RH PAIN MANAGEMENT Goal: RH STG PAIN MANAGED AT OR BELOW PT'S PAIN GOAL Description: < 4 Outcome: Progressing

## 2019-03-23 NOTE — Progress Notes (Addendum)
Oscar Baker PHYSICAL MEDICINE & REHABILITATION PROGRESS NOTE   Subjective/Complaints:  Pt reports still constipated- per chart, had 3 BMs yesterday- pt emphatic only had 1 small BM last night.  Said cannot focus on therapy when feels like 'has to poop". Buttocks, where has Stage II also hurts whenever has BM, and sometimes, when sitting on it.  ROS_ pt denies CP, SOB, N/V/D.  Objective:   No results found. Recent Labs    03/21/19 0520 03/23/19 0635  WBC 41.9* 40.3*  HGB 9.4* 9.4*  HCT 28.3* 29.5*  PLT PLATELET CLUMPS NOTED ON SMEAR, UNABLE TO ESTIMATE 594*   Recent Labs    03/21/19 0520 03/23/19 0635  NA 135 133*  K 4.4 4.4  CL 101 100  CO2 27 27  GLUCOSE 137* 106*  BUN 21 20  CREATININE 0.94 1.03  CALCIUM 8.4* 8.2*    Intake/Output Summary (Last 24 hours) at 03/23/2019 0844 Last data filed at 03/23/2019 0813 Gross per 24 hour  Intake 982 ml  Output 2875 ml  Net -1893 ml     Physical Exam: Vital Signs Blood pressure 107/61, pulse 73, temperature 97.6 F (36.4 C), temperature source Oral, resp. rate 18, height 5' 5.98" (1.676 m), weight 94.1 kg, SpO2 94 %.   Physical Exam Nursing note, labs, and vitalsreviewed. Constitutional:  Awake, alert, appropriate, very HOH, nurse at bedside, lying in bed, wife also at bedside; can hear better today; laughing some; NAD HENT:  Head:Macrocephalic. Healing abrasion on left cheek-healed CV- regular rate; IRR Respiratory:Effort normal. congested cough chroniccoarse breath sounds, but otherwise good air movement B/L GI: . There isno abdominal tenderness. Slightly distended;   (+) hyperactive BS Musculoskeletal:  Strength- 5/5 in UEs and B/L LLE HF 3/5, otherwise 4+/5 distally in KE/KF/DF and PF RLE- HF 1/5 due to pain; distally 4+/5 in KE/KF/DF and PF Comments: Right flank with very large hematoma that's purple; a smaller bruise but larger hematoma on R lateral mid back with protrusion of skin by hematoma.  Right hip incision with dry dressing. Neurological: He is alertand oriented to person, place, and time.  Skin: Skin iswarmand dry. wearing depends from overnight.   Assessment/Plan: 1. Functional deficits secondary to R periprosthetic hip fracture due to ground level fall and poor balance which require 3+ hours per day of interdisciplinary therapy in a comprehensive inpatient rehab setting.  Physiatrist is providing close team supervision and 24 hour management of active medical problems listed below.  Physiatrist and rehab team continue to assess barriers to discharge/monitor patient progress toward functional and medical goals  Care Tool:  Bathing    Body parts bathed by patient: Right arm, Left arm, Chest, Abdomen, Front perineal area, Right upper leg, Left upper leg, Face   Body parts bathed by helper: Right lower leg, Left lower leg, Buttocks     Bathing assist Assist Level: 2 Helpers     Upper Body Dressing/Undressing Upper body dressing   What is the patient wearing?: Pull over shirt    Upper body assist Assist Level: Minimal Assistance - Patient > 75%    Lower Body Dressing/Undressing Lower body dressing      What is the patient wearing?: Pants, Underwear/pull up     Lower body assist Assist for lower body dressing: 2 Helpers     Toileting Toileting    Toileting assist Assist for toileting: 2 Helpers     Transfers Chair/bed transfer  Transfers assist     Chair/bed transfer assist level: 2 Helpers Chair/bed transfer  assistive device: Museum/gallery exhibitions officer assist   Ambulation activity did not occur: Safety/medical concerns(increased pain/decreased activity tolerance)          Walk 10 feet activity   Assist  Walk 10 feet activity did not occur: Safety/medical concerns(increased pain/decreased activity tolerance)        Walk 50 feet activity   Assist Walk 50 feet with 2 turns activity did not occur:  Safety/medical concerns(increased pain/decreased activity tolerance)         Walk 150 feet activity   Assist Walk 150 feet activity did not occur: Safety/medical concerns(increased pain/decreased activity tolerance)         Walk 10 feet on uneven surface  activity   Assist Walk 10 feet on uneven surfaces activity did not occur: Safety/medical concerns(increased pain/decreased activity tolerance)         Wheelchair     Assist   Type of Wheelchair: Manual    Wheelchair assist level: Dependent - Patient 0%(Patient with increased pain/decreased activity tolerance)      Wheelchair 50 feet with 2 turns activity    Assist    Wheelchair 50 feet with 2 turns activity did not occur: Safety/medical concerns(increased pain/decreased activity tolerance)       Wheelchair 150 feet activity     Assist  Wheelchair 150 feet activity did not occur: Safety/medical concerns(increased pain/decreased activity tolerance)       Blood pressure 107/61, pulse 73, temperature 97.6 F (36.4 C), temperature source Oral, resp. rate 18, height 5' 5.98" (1.676 m), weight 94.1 kg, SpO2 94 %.    Medical Problem List and Plan: 1. R hip periprosthetic fracture s/p ORIF secondary to ground level fall at home due to poor balance.            -will need 15/7 for therapy due to poor movement. 2. Antithrombotics: -DVT/anticoagulation:Pharmaceutical:Coumadin -antiplatelet therapy: on coumadin for Afib- INR 2.0  9/30- INR 2.5- managed by pharmacy  10/1- INR 2.5 and stable 3. Pain Management:continue gabapentin tid for neuropathy and Norco 7/5/325 mg q4 hours prn for pain  9/29- pain doing OK per pt currently- might need increase in Norco dose, possibly- will monitor 4. Mood:LCSW to follow for evaluation and support. -antipsychotic agents: N/A 5. Neuropsych: This patient is capable of making decisions on his own behalf. 6. Skin/Wound Care:Monitor wound  for healing. Continue nutritional supplement to promote healing.            -did entire body skin check- has 2 large hematomas and a R hip incision- will need kinesiotaping for 1 of hematomas 7. Fluids/Electrolytes/Nutrition:Monitor I/O. Check lytes in am. 8. Chronic A FIB: Monitor HR tid--continue coreg. Monitor for orthostatic symptoms.  9/29- INR 2.0- will d/c IV 9. CLL: WBC trending back down- is normally in 20s.   9/29- stable at 41k- will con't to monitor- if contues to increase, will call Heme/onc.  10/1- down to 40.3k- considering, has been stable for last few days. 10 ABLA: Continue to monitor H/H--stabilizing around 8. On iron supplement.   10/1- calls iron pill his "birth control pill"  11. T2DM: On lantus 35 units daily with SSI for elevated BS. Monitor BS ac/hs and titrate lantus as indicated.   9/29- BGs adequate control for now- will monitor  CBG (last 3)  Recent Labs    03/22/19 1635 03/22/19 2049 03/23/19 0642  GLUCAP 160* 114* 93    12.Hyponatremia: Will check follow up labs in am.  9/29- labs normalized 13.  Chronic cough (due to chronic aspiration): per records has refused dysphagia diet. On duonebs tid. On Neurontin for cough? Keep HOB>30 degrees 14. Constipation due to opioids- will schedule Senokot S 2 tabs BID and use prns as necessary,  9/30- will d/c Colace, increase miralax ot BID, and con't Senokot S 2 tabs BID  10/1- will give Mg citrate after therapy and follow with Suppository if required 15. Poor balance            - will have vestibular PT evaluation done and see if can be helped at all. Also told pt to drink a lot- he doesn't like to because condom cath.  9/29- d/w PT- she will make sure it's done.    LOS: 3 days A FACE TO FACE EVALUATION WAS PERFORMED  Oscar Baker 03/23/2019, 8:44 AM

## 2019-03-23 NOTE — IPOC Note (Addendum)
Overall Plan of Care [facility]) Patient Details Name: Oscar Baker MRN: [id number] DOB: 1932-12-04  Admitting Diagnosis: Periprosthetic fracture around internal prosthetic right hip joint [facility])  Hospital Problems: Principal Problem:   Periprosthetic fracture around internal prosthetic right hip joint ([place]) Active Problems:   PAF (paroxysmal atrial fibrillation) ([place])   Hypertensive disorder   Symptomatic bradycardia - s/p Biotronik (serial number [id number])   Diabetes mellitus type 2, noninsulin dependent (H[facility])   Cough variant asthma   Chronic systolic CHF (congestive heart failure) ([facility])   CLL (chronic lymphocytic leukemia) ([place])     Functional Problem List: Nursing Bladder, Bowel, Edema, Endurance, Medication Management, Pain, Skin Integrity  PT Balance, Behavior, Edema, Endurance, Motor, Nutrition, Pain, Safety, Sensory, Skin Integrity  OT Balance, Cognition, Endurance, Motor, Pain, Safety  SLP    TR         Basic ADL's: OT Grooming, Bathing, Toileting, Dressing     Advanced  ADL's: OT       Transfers: PT Bed Mobility, Bed to Chair, Car, Furniture, Futures trader, Metallurgist: PT Ambulation, Emergency planning/management officer, Stairs     Additional Impairments: OT None  SLP        TR      Anticipated Outcomes Item Anticipated Outcome  Self Feeding    Swallowing      Basic self-care  Media planner Transfers Supervision  Bowel/Bladder  min assist  Transfers  supervision  Locomotion  d/c w/c level only, ambulating with therapies for safety with min A  Communication     Cognition     Pain  < 4  Safety/Judgment  Mod I assist   Therapy Plan: PT Intensity: Minimum of 1-2 x/day ,45 to 90 minutes PT Frequency: Total of 15 hours over 7 days of combined therapies PT Duration Estimated Length of Stay: 3 weeks OT Intensity: Minimum of 1-2 x/day, 45 to 90 minutes OT Frequency: Total of 15 hours over 7 days of combined  therapies OT Duration/Estimated Length of Stay: 2.5-3 weeks     Due to the current state of emergency, patients may not be receiving their 3-hours of Medicare-mandated therapy.   Team Interventions: Nursing Interventions Patient/Family Education, Medication Management, Psychosocial Support, Bladder Management, Skin Care/Wound Management, Bowel Management, Disease Management/Prevention, Pain Management  PT interventions Ambulation/gait training, Discharge planning, Functional mobility training, Psychosocial support, Therapeutic Activities, Visual/perceptual remediation/compensation, Balance/vestibular training, Disease management/prevention, Neuromuscular re-education, Skin care/wound management, Therapeutic Exercise, Wheelchair propulsion/positioning, DME/adaptive equipment instruction, Pain management, Splinting/orthotics, UE/LE Strength taining/ROM, Community reintegration, Technical sales engineer stimulation, Barrister's clerk education, IT trainer, UE/LE Coordination activities  OT Interventions Training and development officer, Academic librarian, Cognitive remediation/compensation, Discharge planning, Disease mangement/prevention, Engineer, drilling, Functional mobility training, Pain management, Patient/family education, Psychosocial support, Self Care/advanced ADL retraining, Therapeutic Activities, Therapeutic Exercise, UE/LE Strength taining/ROM  SLP Interventions    TR Interventions    SW/CM Interventions Discharge Planning, Psychosocial Support, Patient/Family Education   Barriers to Discharge MD  Medical stability, Home enviroment access/loayout, Incontinence, Wound care, Weight bearing restrictions and HOH  Nursing Weight bearing restrictions    PT Decreased caregiver support, Weight bearing restrictions, Behavior Patient's wife is only available caregiver at d/c, can only provide supervision at d/c; R LE PWB, patient limited by pain and some self limiting behaviors   OT      SLP      SW       Team Discharge Planning: Destination: PT-Home ,OT- Home , SLP-  Projected Follow-up: PT-Home  health PT, OT-  Home health OT, SLP-  Projected Equipment Needs: PT-To be determined, OT- Tub/shower seat, SLP-  Equipment Details: PT-has RW and rollator, will need w/c will provide measurements upon further assessment, OT-  Patient/family involved in discharge planning: PT- Patient, Family member/caregiver,  OT-Patient, Family member/caregiver, SLP-   MD ELOS: 2.5 to 3 weeks  Medical Rehab Prognosis:  Good Assessment: Pt is an 85 yr old male with R periprosthetic hip fx s/p ORIF due to ground level fall- with severe bruising, constipation, A Fib with Coumadin- INR 2.5, and DM- pretty well controlled as well as CLL- with WBC of 40k.  Goals- supervision to min assist for d/c.    See Team Conference Notes for weekly updates to the plan of care

## 2019-03-24 ENCOUNTER — Inpatient Hospital Stay (HOSPITAL_COMMUNITY): Payer: Medicare Other

## 2019-03-24 ENCOUNTER — Inpatient Hospital Stay (HOSPITAL_COMMUNITY): Payer: Medicare Other | Admitting: Occupational Therapy

## 2019-03-24 DIAGNOSIS — I10 Essential (primary) hypertension: Secondary | ICD-10-CM

## 2019-03-24 LAB — GLUCOSE, CAPILLARY
Glucose-Capillary: 87 mg/dL (ref 70–99)
Glucose-Capillary: 106 mg/dL — ABNORMAL HIGH (ref 70–99)
Glucose-Capillary: 131 mg/dL — ABNORMAL HIGH (ref 70–99)
Glucose-Capillary: 88 mg/dL (ref 70–99)

## 2019-03-24 LAB — PROTIME-INR
INR: 2.5 — ABNORMAL HIGH (ref 0.8–1.2)
Prothrombin Time: 26.5 seconds — ABNORMAL HIGH (ref 11.4–15.2)

## 2019-03-24 MED ORDER — IPRATROPIUM-ALBUTEROL 0.5-2.5 (3) MG/3ML IN SOLN: 3.0000 mL | Freq: Three times a day (TID) | RESPIRATORY_TRACT | Status: DC

## 2019-03-24 MED ORDER — SENNOSIDES-DOCUSATE SODIUM 8.6-50 MG PO TABS
1.0000 | ORAL_TABLET | Freq: Two times a day (BID) | ORAL | Status: DC
  Administered 2019-03-24 (×2): 1 via ORAL
  Filled 2019-03-24 (×2): qty 1

## 2019-03-24 MED ORDER — IPRATROPIUM-ALBUTEROL 0.5-2.5 (3) MG/3ML IN SOLN
3.0000 mL | RESPIRATORY_TRACT | Status: DC | PRN
  Administered 2019-04-07: 15:00:00 3 mL via RESPIRATORY_TRACT
  Filled 2019-03-24 (×2): qty 3

## 2019-03-24 MED ORDER — IPRATROPIUM-ALBUTEROL 0.5-2.5 (3) MG/3ML IN SOLN
3.0000 mL | Freq: Three times a day (TID) | RESPIRATORY_TRACT | Status: DC
  Administered 2019-03-24 – 2019-03-30 (×19): 3 mL via RESPIRATORY_TRACT
  Filled 2019-03-24 (×22): qty 3

## 2019-03-24 MED ORDER — POLYETHYLENE GLYCOL 3350 17 G PO PACK
17.0000 g | PACK | Freq: Every day | ORAL | Status: DC
  Administered 2019-03-26 – 2019-04-04 (×9): 17 g via ORAL
  Filled 2019-03-24 (×11): qty 1

## 2019-03-24 MED ORDER — WARFARIN SODIUM 5 MG PO TABS
5.0000 mg | ORAL_TABLET | Freq: Once | ORAL | Status: AC
  Administered 2019-03-24: 18:00:00 5 mg via ORAL
  Filled 2019-03-24: qty 1

## 2019-03-24 NOTE — Progress Notes (Signed)
[place] PHYSICAL MEDICINE & REHABILITATION PROGRESS NOTE   Subjective/Complaints:  Pt reports had BMs ALL day yesterday- every time he coughed, he pooped.  Thinks he's cleaned out now. Wants to back off of bowel meds- didn't take Mg citrate yesterday- didn't need it.  Found out resp d/c'd his duonebs order- hasn't received hardly any breathing treatments since here and feels awful, resp wise.   ROS_ pt denies CP, N/V/D- feels (+)SOB  Objective:   No results found. Recent Labs    03/23/19 0635  WBC 40.3*  HGB 9.4*  HCT 29.5*  PLT 594*   Recent Labs    03/23/19 0635  NA 133*  K 4.4  CL 100  CO2 27  GLUCOSE 106*  BUN 20  CREATININE 1.03  CALCIUM 8.2*    Intake/Output Summary (Last 24 hours) at 03/24/2019 0845 Last data filed at 03/24/2019 0703 Gross per 24 hour  Intake 622 ml  Output 3475 ml  Net -2853 ml     Physical Exam: Vital Signs Blood pressure (!) 96/52, pulse (!) 59, temperature 98.3 F (36.8 C), resp. rate 18, height 5' 5.98" (1.676 m), weight 94.1 kg, SpO2 93 %.   Physical Exam Nursing note, labs, and vitalsreviewed. Constitutional:  Awake, alert, appropriate, very HOH, a little abrasive today; wife at bedside; frustrated about bowel and reps issues; lying in bed, NAD HENT:  Head:Macrocephalic. Healing abrasion on left cheek-healed CV- regular rate; IRR Respiratory:Effort normal. congested cough chronic- more congested than normal; wheezing audibly and very coarse breath sounds- less air movement B/L GI: . There isno abdominal tenderness.NT; BS(+) Musculoskeletal:  Strength- 5/5 in UEs and B/L LLE HF 3/5, otherwise 4+/5 distally in KE/KF/DF and PF RLE- HF 1/5 due to pain; distally 4+/5 in KE/KF/DF and PF Comments: Right flank with very large hematoma that's purple; a smaller bruise but larger hematoma on R lateral mid back with protrusion of skin by hematoma.  Right hip incision with dry dressing. Neurological: He is alertand  oriented to person, place, and time.  Skin: Skin iswarmand dry. wearing depends from overnight.   Assessment/Plan: 1. Functional deficits secondary to R periprosthetic hip fracture due to ground level fall and poor balance which require 3+ hours per day of interdisciplinary therapy in a comprehensive inpatient rehab setting.  Physiatrist is providing close team supervision and 24 hour management of active medical problems listed below.  Physiatrist and rehab team continue to assess barriers to discharge/monitor patient progress toward functional and medical goals  Care Tool:  Bathing    Body parts bathed by patient: Right arm, Left arm, Chest, Abdomen, Front perineal area, Right upper leg, Left upper leg, Face   Body parts bathed by helper: Right lower leg, Left lower leg, Buttocks     Bathing assist Assist Level: 2 Helpers     Upper Body Dressing/Undressing Upper body dressing   What is the patient wearing?: Hospital gown only    Upper body assist Assist Level: Minimal Assistance - Patient > 75%    Lower Body Dressing/Undressing Lower body dressing      What is the patient wearing?: Incontinence brief     Lower body assist Assist for lower body dressing: 2 Helpers     Toileting Toileting    Toileting assist Assist for toileting: 2 Helpers     Transfers Chair/bed transfer  Transfers assist     Chair/bed transfer assist level: 2 Helpers Chair/bed transfer assistive device: Sliding board   Locomotion Ambulation   Ambulation assist  Ambulation activity did not occur: Safety/medical concerns(increased pain/decreased activity tolerance)          Walk 10 feet activity   Assist  Walk 10 feet activity did not occur: Safety/medical concerns(increased pain/decreased activity tolerance)        Walk 50 feet activity   Assist Walk 50 feet with 2 turns activity did not occur: Safety/medical concerns(increased pain/decreased activity  tolerance)         Walk 150 feet activity   Assist Walk 150 feet activity did not occur: Safety/medical concerns(increased pain/decreased activity tolerance)         Walk 10 feet on uneven surface  activity   Assist Walk 10 feet on uneven surfaces activity did not occur: Safety/medical concerns(increased pain/decreased activity tolerance)         Wheelchair     Assist   Type of Wheelchair: Manual    Wheelchair assist level: Dependent - Patient 0%(Patient with increased pain/decreased activity tolerance)      Wheelchair 50 feet with 2 turns activity    Assist    Wheelchair 50 feet with 2 turns activity did not occur: Safety/medical concerns(increased pain/decreased activity tolerance)       Wheelchair 150 feet activity     Assist  Wheelchair 150 feet activity did not occur: Safety/medical concerns(increased pain/decreased activity tolerance)       Blood pressure (!) 96/52, pulse (!) 59, temperature 98.3 F (36.8 C), resp. rate 18, height 5' 5.98" (1.676 m), weight 94.1 kg, SpO2 93 %.    Medical Problem List and Plan: 1. R hip periprosthetic fracture s/p ORIF secondary to ground level fall at home due to poor balance.            -will need 15/7 for therapy due to poor movement. 2. Antithrombotics: -DVT/anticoagulation:Pharmaceutical:Coumadin -antiplatelet therapy: on coumadin for Afib- INR 2.0  9/30- INR 2.5- managed by pharmacy  10/1- INR 2.5 and stable 3. Pain Management:continue gabapentin tid for neuropathy and Norco 7/5/325 mg q4 hours prn for pain  9/29- pain doing OK per pt currently- might need increase in Norco dose, possibly- will monitor 4. Mood:LCSW to follow for evaluation and support. -antipsychotic agents: N/A 5. Neuropsych: This patient is capable of making decisions on his own behalf. 6. Skin/Wound Care:Monitor wound for healing. Continue nutritional supplement to promote healing.             -did entire body skin check- has 2 large hematomas and a R hip incision- will need kinesiotaping for 1 of hematomas 7. Fluids/Electrolytes/Nutrition:Monitor I/O. Check lytes in am. 8. Chronic A FIB: Monitor HR tid--continue coreg. Monitor for orthostatic symptoms.  9/29- INR 2.0- will d/c IV 9. CLL: WBC trending back down- is normally in 20s.   9/29- stable at 41k- will con't to monitor- if contues to increase, will call Heme/onc.  10/1- down to 40.3k- considering, has been stable for last few days. 10 ABLA: Continue to monitor H/H--stabilizing around 8. On iron supplement.   10/1- calls iron pill his "birth control pill"  11. T2DM: On lantus 35 units daily with SSI for elevated BS. Monitor BS ac/hs and titrate lantus as indicated.   9/29- BGs adequate control for now- will monitor  CBG (last 3)  Recent Labs    03/23/19 1640 03/23/19 2105 03/24/19 0556  GLUCAP 123* 157* 87    12.Hyponatremia: Will check follow up labs in am.  9/29- labs normalized 13. Chronic cough (due to chronic aspiration): per records has refused dysphagia diet. On duonebs  tid. On Neurontin for cough? Keep HOB>30 degrees  10/2- reordered Duonebs TID since resp d/c'd AND made prn as well, so doesn't miss a dose with therapy.  14. Constipation due to opioids- will schedule Senokot S 2 tabs BID and use prns as necessary,  9/30- will d/c Colace, increase miralax ot BID, and con't Senokot S 2 tabs BID  10/1- will give Mg citrate after therapy and follow with Suppository if required  10/2- fixed-/resolved- will decrease Senokot S to 1 tab BID and miralax to daily.  15. Poor balance            - will have vestibular PT evaluation done and see if can be helped at all. Also told pt to drink a lot- he doesn't like to because condom cath.  9/29- d/w PT- she will make sure it's done.    LOS: 4 days A FACE TO FACE EVALUATION WAS PERFORMED  Rochell Puett 03/24/2019, 8:45 AM

## 2019-03-24 NOTE — Progress Notes (Signed)
Social Work Patient ID: Oscar Baker, male   DOB: 1933/03/31, 85 y.o.   MRN: [id number]   CSW met with pt and his wife following team conference on 03-22-19 and updated them on targeted d/c date of 04-11-19.  Pt's wife was very honest with CSW stating that pt needs to get much better for her to be able to care for him at home.  CSW asked if she could hire some help at home, which she said she would consider.  It was agreed that we would see pt's progress over the next week.  Ovidio Kin, CSW will assume pt's case starting next week as this CSW concludes employment with [employer] 03-24-19.  CSW dept will continue to follow and support pt/wife.

## 2019-03-24 NOTE — Progress Notes (Signed)
Occupational Therapy Session Note  Patient Details  Name: Oscar Baker MRN: [id number] Date of Birth: 1932-10-17  Today's Date: 03/24/2019 OT Individual Time: 1345-1425 OT Individual Time Calculation (min): 40 min    Short Term Goals: Week 1:  OT Short Term Goal 1 (Week 1): Pt will complete sit > stand as needed for LB self-care with mod assist of one caregiver OT Short Term Goal 2 (Week 1): Pt will complete bathing with mod assist of one caregiver OT Short Term Goal 3 (Week 1): Pt will complete LB dressing with mod assist of one caregiver with AE as needed OT Short Term Goal 4 (Week 1): Pt will complete toilet transfer with max assist of one caregiver  Skilled Therapeutic Interventions/Progress Updates:    Pt resting in w/c upon arrival with wife present.  Pt had remained in w/c since 0800 therapy and requested to return to bed.  Pt required mod A for sit<>stand from w/c with RW. Min A for stand pivot transfer with max verbal cues for sequencing.  Pt required tot A +2 for sit>supne in bed and min a for rolling R<>L in bed to assist with repositioning. Positioned RLE in neutral with pillow to maintain position. Pt remained in bed with bed alarm activated and all needs within reach.   Therapy Documentation Precautions:  Precautions Precautions: Fall Restrictions Weight Bearing Restrictions: Yes RLE Weight Bearing: Partial weight bearing RLE Partial Weight Bearing Percentage or Pounds: 50 Pain:  Pt screams with increased pain during transitional movements; repositioned   Therapy/Group: Individual Therapy  [name] 03/24/2019, 2:51 PM

## 2019-03-24 NOTE — Progress Notes (Signed)
[facility] for Coumadin Indication: atrial fibrillation  Patient Measurements: Height: 5' 5.98" (167.6 cm) Weight: 207 lb 7.3 oz (94.1 kg) IBW/kg (Calculated) : 63.75  Vital Signs: Temp: 98.3 F (36.8 C) (10/02 0558) BP: 96/52 (10/02 0558) Pulse Rate: 59 (10/02 0558)  Labs: Recent Labs    03/22/19 0506 03/23/19 0635 03/24/19 0627  HGB  --  9.4*  --   HCT  --  29.5*  --   PLT  --  594*  --   LABPROT 26.2* 32.1* 26.5*  INR 2.5* 3.2* 2.5*  CREATININE  --  1.03  --     Estimated Creatinine Clearance: 57.3 mL/min (by C-G formula based on SCr of 1.03 mg/dL).   Assessment: Patient on Warfarin for atrial fibrillation. PTA Warfarin dosing: 5 mg daily except 7.5 mg on Tues and Sat, last dose 9/17 PTA. Resumed Warfarin on 9/23 post THA surgery. No bleeding noted.   Goal of Therapy:  INR 2-3 Monitor platelets by anticoagulation protocol: Yes   Plan:  - INR therapeutic at 2.5  - Will dose Warfarin 5 mg PO x 1 dose  - Likely will need a lower home dose regimen - Will continue daily INR  Duanne Limerick, PharmD, BCPS Please see AMION for all pharmacy numbers 03/24/2019 8:51 AM

## 2019-03-24 NOTE — Progress Notes (Signed)
Physical Therapy Session Note  Patient Details  Name: Oscar Baker MRN: [id number] Date of Birth: 13-Aug-1932  Today's Date: 03/24/2019 PT Individual Time: 0950-1020 PT Individual Time Calculation (min): 30 min   Short Term Goals: Week 1:  PT Short Term Goal 1 (Week 1): Patient will perform basic transfer using LRAD consistenty with mod A of 1 person. PT Short Term Goal 2 (Week 1): Patient will perform sit<>supine with mod A. PT Short Term Goal 3 (Week 1): Patient will propel w/c >50 feet.  Skilled Therapeutic Interventions/Progress Updates:     Patient in w/c with his wife in the room upon PT arrival. Patient alert and agreeable to PT session. Patient reported 4/10 L hip pain during session, and stated that he was pre-medicated prior to session. PT provided repositioning, rest breaks, and distraction as pain interventions throughout session. Patient had a significant coughing spell at beginning of session, resulting in need for suction x3 with successful clearance of secretions, RN made aware.    Therapeutic Activity: PT applied Kineseo tape in lantern style with 0% pull to patient's back for edema control to a large hematoma on the patients R side of his mid back. Educated patient and his wife on benefits of tape, signs/symptoms of allergic reaction or skin irritation, and checking tape after 5 min, 30 min, and every hour after today.  Wheelchair Mobility:  Patient propelled wheelchair 105 feet with supervision using B UEs, limited distance due to a second coughing spell resulting in more cleared secretions. Provided verbal cues for propulsion and turning technique.   Educated patient and his wife on importance of clearing secretions and potential causes for increase secretions due to intermittent aspiration and decreased mobility. Patient and his wife were receptive and appreciative of education.   Patient in w/c with is wife in the room at end of session with breaks locked, seat belt  alarm set, and all needs within reach.    Therapy Documentation Precautions:  Precautions Precautions: Fall Restrictions Weight Bearing Restrictions: Yes RLE Weight Bearing: Partial weight bearing RLE Partial Weight Bearing Percentage or Pounds: 50    Therapy/Group: Individual Therapy  Demichael Traum L Clee Pandit PT, DPT  03/24/2019, 12:40 PM

## 2019-03-24 NOTE — Progress Notes (Signed)
Occupational Therapy Session Note  Patient Details  Name: Oscar Baker MRN: [id number] Date of Birth: [birth year]  Today's Date: 03/24/2019 OT Individual Time: 0800-0900 OT Individual Time Calculation (min): 60 min    Short Term Goals: Week 1:  OT Short Term Goal 1 (Week 1): Pt will complete sit > stand as needed for LB self-care with mod assist of one caregiver OT Short Term Goal 2 (Week 1): Pt will complete bathing with mod assist of one caregiver OT Short Term Goal 3 (Week 1): Pt will complete LB dressing with mod assist of one caregiver with AE as needed OT Short Term Goal 4 (Week 1): Pt will complete toilet transfer with max assist of one caregiver  Skilled Therapeutic Interventions/Progress Updates:    OT intervention with focus on bed mobility, sit<>stand, standing balance, stand pivot transfer, dressing tasks, activity tolerance, and safety awareness to increase independence with BADLs. Pt required max A for supine>sit EOB with max verbal cues for sequencing.  Pt threaded pants with use of reacher and performed sit<>stand with mod A +2 to pull pants over hips.  Pt performed stand pivot transfer to w/c with min A+2. Pt stood at sink to wash face.  Standing balance with min A. Sit<>stand with mod A+2. Pt required assistance repositioning in w/c for comfort. Pt with increased pain with transitional movements.  Ice applied to R thigh and hip. Pt remained seated in w/c with all needs within reach and belt alarm activated.   Therapy Documentation Precautions:  Precautions Precautions: Fall Restrictions Weight Bearing Restrictions: Yes RLE Weight Bearing: Partial weight bearing RLE Partial Weight Bearing Percentage or Pounds: 50 Pain: Pain Assessment Pain Scale: 0-10 Pain Score: 8  Pain Intervention(s): Medication (See eMAR)   Therapy/Group: Individual Therapy  [name] 03/24/2019, 10:03 AM

## 2019-03-24 NOTE — Patient Care Conference (Signed)
Inpatient RehabilitationTeam Conference and Plan of Care Update Date: 03/22/2019   Time: 10:00 AM    Patient Name: Oscar Baker      Medical Record Number: [id number]  Date of Birth: Nov 13, 1932 Sex: Male         Room/Bed: 4M08C/4M08C-01 Payor Info: Payor: MEDICARE / Plan: MEDICARE PART A AND B / Product Type: *No Product type* /    Admit Date/Time:  03/20/2019  4:16 PM  Primary Diagnosis:  Periprosthetic fracture around internal prosthetic right hip joint [facility])  Patient Active Problem List   Diagnosis Date Noted  . Periprosthetic fracture around internal prosthetic right hip joint ([place]) 03/20/2019  . Closed right hip fracture, initial encounter ([place]) 03/10/2019  . Chronic bronchitis with productive mucopurulent cough ([place]) 08/22/2018  . Acute systolic heart failure ([place]) 08/02/2018  . Type 2 diabetes mellitus ([place]) 08/02/2018  . Pain in joint of right shoulder 06/08/2018  . CAD (coronary artery disease) 05/21/2018  . Asthma exacerbation 05/21/2018  . Aspiration pneumonia ([place]) 05/21/2018  . Elevated INR   . Shoulder strain, right, initial encounter   . Acute respiratory failure with hypoxia ([place]) 05/20/2018  . Bilateral impacted cerumen 02/14/2018  . Asymmetric SNHL (sensorineural hearing loss) 02/14/2018  . Encounter for therapeutic drug monitoring 12/20/2017  . S/P thoracotomy 11/29/2017  . Sepsis ([place])   . Hypoxemia   . Pneumonia of left lower lobe due to infectious organism   . Mediastinal lymphadenopathy   . Chronic cough 11/23/2017  . CLL (chronic lymphocytic leukemia) ([place]) 11/23/2017  . Pleural effusion 11/23/2017  . HCAP (healthcare-associated pneumonia) 11/20/2017  . Anemia 11/20/2017  . Mixed conductive and sensorineural hearing loss of right ear with restricted hearing of left ear 09/14/2017  . Abnormal hearing test 09/06/2017  . Cellulitis 09/06/2017  . Lymphadenitis, acute 09/06/2017  . Ischemic cardiomyopathy 09/01/2017  . S/p bilateral revision of total  hip arthroplasty 08/11/2017  . History of revision of total replacement of left hip joint 08/11/2017  . Right hip pain   . Acute otitis media   . Chronic systolic CHF (congestive heart failure) ([place])   . Pressure injury of skin 07/20/2017  . Pressure ulcer 07/20/2017  . Malignant otitis externa 07/19/2017  . Fall in home 07/19/2017  . Acute on chronic diastolic heart failure ([place]) 07/19/2017  . S/P left THA, AA 05/12/2016  . History of repair of hip joint 05/12/2016  . Laryngopharyngeal reflux 05/21/2015  . Allergic rhinitis 05/21/2015  . Obesity 12/12/2014  . Upper airway cough syndrome 12/11/2014  . Posterior rhinorrhea 12/11/2014  . Cough variant asthma 07/27/2014  . Wheezing 06/20/2014  . Symptomatic bradycardia - s/p Biotronik (serial number [id number]) 02/08/2014  . Bradycardia 02/08/2014  . Diabetes mellitus type 2, noninsulin dependent ([place])   . Mixed incontinence 03/09/2013  . Edema of foot 03/06/2013  . Traumatic ecchymosis of right foot 03/06/2013  . Contusion of foot 03/06/2013  . Malignant neoplasm of prostate ([place]) 02/15/2013  . Diabetes mellitus without complication ([place]) 0000000  . Bone spur 12/30/2012  . Pain of toe of left foot 12/30/2012  . Hyperlipidemia 07/13/2012  . Hypertensive disorder 04/12/2012  . Sick sinus syndrome ([place]) 08/17/2011  . PAF (paroxysmal atrial fibrillation) ([place]) 09/16/2010    Expected Discharge Date: Expected Discharge Date: 04/11/19  Team Members Present: Physician leading conference: Dr. Alysia Penna Social Worker Present: Alfonse Alpers, LCSW Nurse Present: Rayetta Pigg, RN PT Present: Apolinar Junes, PT OT Present: Willeen Cass, OT;Roanna Epley, COTA SLP Present: Other (comment)(Erin Tamala Julian,  CF-SLP) PPS Coordinator present : Gunnar Fusi, SLP     Current Status/Progress Goal Weekly Team Focus  Bowel/Bladder   Pt wears condom cath at home and has continued to use here, pt is continent of bowel, c/o constipation.  LBM 09/30 with suppos and digital removal of stool  Pt will be continent of B/B with min assist with normal bowel pattern  toilet q2h and prn laxatives prn   Swallow/Nutrition/ Hydration             ADL's   Max A +2  Supervision  ADL retraining, sit <> stand, transfers   Mobility   Mod-max A bed mobility and stand pivot transfer bed>w/c with RW  Supervision w/c level, min A gait 25' with LRAD  Balance, strength/ROM, activity tolerance, pain/edema management, functional mobility, gait training, patient/caregiver education   Communication             Safety/Cognition/ Behavioral Observations            Pain   C/O Right hip pain managed with Norco, Tramadol, Robaxin prn  Pain <=4/10  Assess pain qshift and prn, call MD for pain that is unrelieved   Skin   Right hip incision, Right hip and lower back ecchymosis, MASD buttocks and groin Stage II sacrum (barrier cream and foam)  Improvement of MASD no s/sx of infection skin remains intact  Assess skin qshift and prn    Rehab Goals Patient on target to meet rehab goals: Yes Rehab Goals Revised: none *See Care Plan and progress notes for long and short-term goals.     Barriers to Discharge  Current Status/Progress Possible Resolutions Date Resolved   Nursing  Weight bearing restrictions               PT  Decreased caregiver support;Weight bearing restrictions;Behavior  Patient's wife is only available caregiver at d/c, can only provide supervision at d/c; R LE PWB, patient limited by pain and some self limiting behaviors              OT                  SLP                SW                Discharge Planning/Teaching Needs:  Pt hopes he will progress to a level where his wife can assist him at home.  Wife states pt needs to make a lot of progress prior to d/c.  Family education to be completed closer to d/c.   Team Discussion:  Pt with hip replacement due to fall.  He has a surgical dressing and bruising on right hip.  Pt has a  stage 2 on sacrum due to MASD.  Pt has urinary incontinence and is using his own condom cath.  Pt is on coumadin and pain is okay.  Pt has S level goals (wife could do) but is max A +2 now and he has trouble maintaining his PWB status.  Pt has chest pain with transitions and has a ways to go.  Pt increased his bed mobility to min A with rails.  Pt's txs mod/max +2 for sit to stand and needs cues to maintain PWB.  Team is unsure if pt will get to supervision level goals.  Revisions to Treatment Plan:  none    Medical Summary Current Status: Pt constipated- will change meds; WBC 41k- from CLL- will monitor;  INR 2.5 Weekly Focus/Goal: constipation- will change meds  Barriers to Discharge: Behavior;Home enviroment access/layout;Decreased family/caregiver support;Incontinence;Medical stability;Wound care;Weight bearing restrictions  Barriers to Discharge Comments: n/a Possible Resolutions to Barriers: family traiing; strengthening   Continued Need for Acute Rehabilitation Level of Care: The patient requires daily medical management by a physician with specialized training in physical medicine and rehabilitation for the following reasons: Direction of a multidisciplinary physical rehabilitation program to maximize functional independence : Yes Medical management of patient stability for increased activity during participation in an intensive rehabilitation regime.: Yes Analysis of laboratory values and/or radiology reports with any subsequent need for medication adjustment and/or medical intervention. : Yes   I attest that I was present, lead the team conference, and concur with the assessment and plan of the team.Team conference was held via web/ teleconference due to [place] - 19.   Idonia Zollinger, Silvestre Mesi 03/24/2019, 2:02 AM

## 2019-03-25 ENCOUNTER — Inpatient Hospital Stay (HOSPITAL_COMMUNITY): Payer: Medicare Other | Admitting: Occupational Therapy

## 2019-03-25 ENCOUNTER — Inpatient Hospital Stay (HOSPITAL_COMMUNITY): Payer: Medicare Other

## 2019-03-25 LAB — GLUCOSE, CAPILLARY
Glucose-Capillary: 62 mg/dL — ABNORMAL LOW (ref 70–99)
Glucose-Capillary: 66 mg/dL — ABNORMAL LOW (ref 70–99)
Glucose-Capillary: 120 mg/dL — ABNORMAL HIGH (ref 70–99)
Glucose-Capillary: 79 mg/dL (ref 70–99)
Glucose-Capillary: 184 mg/dL — ABNORMAL HIGH (ref 70–99)
Glucose-Capillary: 130 mg/dL — ABNORMAL HIGH (ref 70–99)

## 2019-03-25 LAB — PROTIME-INR
INR: 2.2 — ABNORMAL HIGH (ref 0.8–1.2)
Prothrombin Time: 24 seconds — ABNORMAL HIGH (ref 11.4–15.2)

## 2019-03-25 MED ORDER — INSULIN GLARGINE 100 UNIT/ML ~~LOC~~ SOLN
38.0000 [IU] | Freq: Every day | SUBCUTANEOUS | Status: DC
  Administered 2019-03-26: 09:00:00 38 [IU] via SUBCUTANEOUS
  Filled 2019-03-25: qty 0.38

## 2019-03-25 MED ORDER — WARFARIN SODIUM 5 MG PO TABS
5.0000 mg | ORAL_TABLET | Freq: Once | ORAL | Status: AC
  Administered 2019-03-25: 18:00:00 5 mg via ORAL
  Filled 2019-03-25: qty 1

## 2019-03-25 MED ORDER — SENNOSIDES-DOCUSATE SODIUM 8.6-50 MG PO TABS
1.0000 | ORAL_TABLET | Freq: Every day | ORAL | Status: DC
  Administered 2019-03-27 – 2019-04-04 (×7): 1 via ORAL
  Filled 2019-03-25 (×12): qty 1

## 2019-03-25 MED ORDER — HYDROCODONE-ACETAMINOPHEN 7.5-325 MG PO TABS
1.0000 | ORAL_TABLET | ORAL | Status: DC | PRN
  Administered 2019-03-25 – 2019-03-29 (×5): 1 via ORAL
  Filled 2019-03-25 (×6): qty 1

## 2019-03-25 MED ORDER — HYDROCODONE-ACETAMINOPHEN 10-325 MG PO TABS
1.0000 | ORAL_TABLET | ORAL | Status: DC | PRN
  Administered 2019-03-27 – 2019-03-29 (×5): 1 via ORAL
  Filled 2019-03-25 (×7): qty 1

## 2019-03-25 NOTE — Progress Notes (Signed)
PHYSICAL MEDICINE & REHABILITATION PROGRESS NOTE   Subjective/Complaints:  Pt reports Had 2 large, loose BMs this AM again- incontinent- said has been getting resp treatments as of yesterday- feels better when gets them.   ROS_ pt denies CP, N/V/D- feels less SOB than yesterday; frustrated about bowels.  Objective:   No results found. Recent Labs    03/23/19 0635  WBC 40.3*  HGB 9.4*  HCT 29.5*  PLT 594*   Recent Labs    03/23/19 0635  NA 133*  K 4.4  CL 100  CO2 27  GLUCOSE 106*  BUN 20  CREATININE 1.03  CALCIUM 8.2*    Intake/Output Summary (Last 24 hours) at 03/25/2019 1138 Last data filed at 03/25/2019 0700 Gross per 24 hour  Intake 600 ml  Output 1950 ml  Net -1350 ml     Physical Exam: Vital Signs Blood pressure 103/60, pulse 78, temperature 98.3 F (36.8 C), resp. rate 18, height 5' 5.98" (1.676 m), weight 93 kg, SpO2 98 %.   Physical Exam Nursing note and vitalsreviewed. Constitutional:  Awake, alert, appropriate, very HOH, commenting on stools; sitting up in bed; wearing depends, wife at bedside, NAD HENT:  Head:Macrocephalic. Healing abrasion on left cheek-appears more like birth mark? CV- regular rate; IRR Respiratory:Effort normal. congested cough chronic-after coughing, 1 wheeze on R, but improved air movement B/L GI: . There isno abdominal tenderness.NT; BS(+) Musculoskeletal:  Strength- 5/5 in UEs and B/L LLE HF 3/5, otherwise 4+/5 distally in KE/KF/DF and PF RLE- HF 1/5 due to pain; distally 4+/5 in KE/KF/DF and PF Comments: Right flank with very large hematoma that's purple; a smaller bruise but larger hematoma on R lateral mid back with protrusion of skin by hematoma.  Right hip incision with dry dressing. Neurological: He is alertand oriented to person, place, and time.  Skin: Skin iswarmand dry. wearing depends from overnight.   Assessment/Plan: 1. Functional deficits secondary to R periprosthetic  hip fracture due to ground level fall and poor balance which require 3+ hours per day of interdisciplinary therapy in a comprehensive inpatient rehab setting.  Physiatrist is providing close team supervision and 24 hour management of active medical problems listed below.  Physiatrist and rehab team continue to assess barriers to discharge/monitor patient progress toward functional and medical goals  Care Tool:  Bathing    Body parts bathed by patient: Right arm, Left arm, Chest, Abdomen, Front perineal area, Right upper leg, Left upper leg, Face   Body parts bathed by helper: Right lower leg, Left lower leg, Buttocks     Bathing assist Assist Level: 2 Helpers     Upper Body Dressing/Undressing Upper body dressing   What is the patient wearing?: Hospital gown only    Upper body assist Assist Level: Set up assist    Lower Body Dressing/Undressing Lower body dressing      What is the patient wearing?: Pants     Lower body assist Assist for lower body dressing: 2 Helpers     Toileting Toileting    Toileting assist Assist for toileting: 2 Helpers     Transfers Chair/bed transfer  Transfers assist     Chair/bed transfer assist level: 2 Helpers Chair/bed transfer assistive device: Sliding board   Locomotion Ambulation   Ambulation assist   Ambulation activity did not occur: Safety/medical concerns(increased pain/decreased activity tolerance)          Walk 10 feet activity   Assist  Walk 10 feet activity did not occur: Safety/medical  concerns(increased pain/decreased activity tolerance)        Walk 50 feet activity   Assist Walk 50 feet with 2 turns activity did not occur: Safety/medical concerns(increased pain/decreased activity tolerance)         Walk 150 feet activity   Assist Walk 150 feet activity did not occur: Safety/medical concerns(increased pain/decreased activity tolerance)         Walk 10 feet on uneven surface   activity   Assist Walk 10 feet on uneven surfaces activity did not occur: Safety/medical concerns(increased pain/decreased activity tolerance)         Wheelchair     Assist   Type of Wheelchair: Manual    Wheelchair assist level: Supervision/Verbal cueing, Set up assist Max wheelchair distance: 105'    Wheelchair 50 feet with 2 turns activity    Assist    Wheelchair 50 feet with 2 turns activity did not occur: Safety/medical concerns(increased pain/decreased activity tolerance)   Assist Level: Set up assist, Supervision/Verbal cueing   Wheelchair 150 feet activity     Assist  Wheelchair 150 feet activity did not occur: Safety/medical concerns(increased pain/decreased activity tolerance)       Blood pressure 103/60, pulse 78, temperature 98.3 F (36.8 C), resp. rate 18, height 5' 5.98" (1.676 m), weight 93 kg, SpO2 98 %.    Medical Problem List and Plan: 1. R hip periprosthetic fracture s/p ORIF secondary to ground level fall at home due to poor balance.            -will need 15/7 for therapy due to poor movement. 2. Antithrombotics: -DVT/anticoagulation:Pharmaceutical:Coumadin -antiplatelet therapy: on coumadin for Afib- INR 2.0  9/30- INR 2.5- managed by pharmacy  10/1- INR 2.5 and stable  10/3- INR 2.2 3. Pain Management:continue gabapentin tid for neuropathy and Norco 7/5/325 mg q4 hours prn for pain  9/29- pain doing OK per pt currently- might need increase in Norco dose, possibly- will monitor  10/3- wife concerned might need increased pain med dose- will change Norco so can get 7.5 OR 10 mg depending on pain level. 4. Mood:LCSW to follow for evaluation and support. -antipsychotic agents: N/A 5. Neuropsych: This patient is capable of making decisions on his own behalf. 6. Skin/Wound Care:Monitor wound for healing. Continue nutritional supplement to promote healing.            -did entire body skin check- has 2  large hematomas and a R hip incision- will need kinesiotaping for 1 of hematomas 7. Fluids/Electrolytes/Nutrition:Monitor I/O. Check lytes in am. 8. Chronic A FIB: Monitor HR tid--continue coreg. Monitor for orthostatic symptoms.  9/29- INR 2.0- will d/c IV 9. CLL: WBC trending back down- is normally in 20s.   9/29- stable at 41k- will con't to monitor- if contues to increase, will call Heme/onc.  10/1- down to 40.3k- considering, has been stable for last few days. 10 ABLA: Continue to monitor H/H--stabilizing around 8. On iron supplement.   10/1- calls iron pill his "birth control pill"  11. T2DM: On lantus 35 units daily with SSI for elevated BS. Monitor BS ac/hs and titrate lantus as indicated.   9/29- BGs adequate control for now- will monitor  10/3- too low BGs- will decrease Lantus to 38 units from 40 units, do the BG of 60s  CBG (last 3)  Recent Labs    03/25/19 0533 03/25/19 0554 03/25/19 0710  GLUCAP 62* 66* 120*    12.Hyponatremia: Will check follow up labs in am.  9/29- labs normalized 13. Chronic  cough (due to chronic aspiration): per records has refused dysphagia diet. On duonebs tid. On Neurontin for cough? Keep HOB>30 degrees  10/2- reordered Duonebs TID since resp d/c'd AND made prn as well, so doesn't miss a dose with therapy.  10/3- informed pt and wife to ask for prn dose if misses due to therapy.   14. Constipation due to opioids- will schedule Senokot S 2 tabs BID and use prns as necessary,  9/30- will d/c Colace, increase miralax ot BID, and con't Senokot S 2 tabs BID  10/1- will give Mg citrate after therapy and follow with Suppository if required  10/2- fixed-/resolved- will decrease Senokot S to 1 tab BID and miralax to daily.   10/3- will decrease Senokot-S to 1 tab/day- can't stop everything because on opioids.   15. Poor balance            - will have vestibular PT evaluation done and see if can be helped at all. Also told pt to drink a lot- he  doesn't like to because condom cath.  9/29- d/w PT- she will make sure it's done.    LOS: 5 days A FACE TO FACE EVALUATION WAS PERFORMED  Kolbi Altadonna 03/25/2019, 11:38 AM

## 2019-03-25 NOTE — Progress Notes (Signed)
Physical Therapy Session Note  Patient Details  Name: Oscar Baker MRN: [id number] Date of Birth: [birth year]  Today's Date: 03/25/2019 PT Individual Time: 0920-1000 PT Individual Time Calculation (min): 40 min   Short Term Goals: Week 1:  PT Short Term Goal 1 (Week 1): Patient will perform basic transfer using LRAD consistenty with mod A of 1 person. PT Short Term Goal 2 (Week 1): Patient will perform sit<>supine with mod A. PT Short Term Goal 3 (Week 1): Patient will propel w/c >50 feet. Week 2:    Week 3:     Skilled Therapeutic Interventions/Progress Updates:  Pain 8/10 w/mobility, treament to tolerance, repositioning as needed. Pt missed 63min of rx due to MD in room rounding on patient followed by respiratory therapy which MD stated PT must defer time to RT for treatment. Pt initially supine in bed.   Pt dependent for donning shorts.  Rolls w/mod assist.  Supine to sit on EOB w/max assist.  Scoots in sitting w/support of RLE and cga. Bed to wc via SPT using RW and therapist monitoring wb status/pt maintained well w/min to mod assist for transfer and cues for safety. wc propulsion 135ft w/bilat UE's for general strengthening/endurance  Sit to stand in parallel bars w/mod assist and therapist monitoring WB status RLE.  Maintaining PWB well today.  Pt condom catheter fell off in standing, removed from patient and nursing notified.  Pt wearing brief, so continued w/rx. Gait 3steps, 8steps w/cues to use UE's to offset wbing of RLE, maintained wbing well.  Returned to room and left oob in wc w/needs in reach, wife present and agreed to supervise pt.  nursing aware.  Wife present to observe treatment session. Pt hyperverbose and requires frequent redirection     Therapy Documentation Precautions:  Precautions Precautions: Fall Restrictions Weight Bearing Restrictions: No RLE Weight Bearing: Partial weight bearing RLE Partial Weight Bearing Percentage or Pounds:  50    Therapy/Group: Individual Therapy  Callie Fielding, PT   Jerrilyn Cairo 03/25/2019, 12:30 PM

## 2019-03-25 NOTE — Progress Notes (Signed)
CBG: 62  Treatment: orange juice  Symptoms: asymptomatic  Follow-up CBG Time: 0710  CBG Result:120  Possible Reasons for Event:   Comments/MD notified: Notified Dr. Tonye Becket

## 2019-03-25 NOTE — Progress Notes (Signed)
[place] for warfarin Indication: atrial fibrillation  Patient Measurements: Height: 5' 5.98" (167.6 cm) Weight: 205 lb 0.4 oz (93 kg) IBW/kg (Calculated) : 63.75  Vital Signs: Temp: 98.3 F (36.8 C) (10/03 0537) BP: 103/60 (10/03 1011) Pulse Rate: 78 (10/03 1011)  Labs: Recent Labs    03/23/19 0635 03/24/19 0627 03/25/19 0551  HGB 9.4*  --   --   HCT 29.5*  --   --   PLT 594*  --   --   LABPROT 32.1* 26.5* 24.0*  INR 3.2* 2.5* 2.2*  CREATININE 1.03  --   --     Estimated Creatinine Clearance: 57 mL/min (by C-G formula based on SCr of 1.03 mg/dL).   Assessment: Patient on warfarin for atrial fibrillation. PTA Warfarin dosing: 5 mg daily except 7.5 mg on Tues and Sat. Warfarin was resumed on 9/23 s/p THA surgery. No bleeding noted. INR is therapeutic at 2.2. Based on total weekly dose, patient should require about 5 mg/day.   Goal of Therapy:  INR 2-3 Monitor platelets by anticoagulation protocol: Yes    Plan:  - Warfarin 5 mg po x1 - Daily INR   Harvel Quale 03/25/2019 12:33 PM

## 2019-03-25 NOTE — Progress Notes (Signed)
Occupational Therapy Session Note  Patient Details  Name: Oscar Baker MRN: [id number] Date of Birth: 02/03/1933  Today's Date: 03/25/2019 OT Individual Time: 1300-1413 OT Individual Time Calculation (min): 73 min   Short Term Goals: Week 1:  OT Short Term Goal 1 (Week 1): Pt will complete sit > stand as needed for LB self-care with mod assist of one caregiver OT Short Term Goal 2 (Week 1): Pt will complete bathing with mod assist of one caregiver OT Short Term Goal 3 (Week 1): Pt will complete LB dressing with mod assist of one caregiver with AE as needed OT Short Term Goal 4 (Week 1): Pt will complete toilet transfer with max assist of one caregiver    Skilled Therapeutic Interventions/Progress Updates:    Pt greeted in his w/c, finishing up with lunch. Refusing to bathe, however agreeable to start session with oral care and shaving while seated at the sink. He needed min vcs for stated tasks, including operating his new Copy. Pts affect visibly brightened once finished. Toilet transfer completed with +2 Mod A sit<stand, Min A for stand pivot<elevated toilet. +2 assist for clothing mgt and hygiene with pt having B+B void (very small BM). During sit<stand transitions, facilitated R LE forward for PWB adherence. Pt able to state that he could only put 50% of his weight on his R LE, mindful of WB precautions during session. Afterwards pt returned to his w/c. He expressed that he wanted to learn some UB exercises to do in his w/c and also while in bed. Guided him through forward punches, overhead punches, cross body punches x10 reps using orange theraband. Pt visibly fatigued in between exercises. Tethered a theraband to his w/c and also to his bed to promote exercise engage outside of tx. Pt left in his w/c, spouse present and call bell on bed. Tx focus placed on ADL retraining, precaution adherence, activity tolerance, and balance.   Therapy Documentation Precautions:   Precautions Precautions: Fall Restrictions Weight Bearing Restrictions: No RLE Weight Bearing: Partial weight bearing RLE Partial Weight Bearing Percentage or Pounds: 50 Vital Signs: Therapy Vitals Temp: 97.7 F (36.5 C) Temp Source: Oral Pulse Rate: 60 Resp: 18 BP: (!) 102/58 Patient Position (if appropriate): Sitting Oxygen Therapy SpO2: 100 % O2 Device: Room Air Pain: Pt reported being premedicated for pain Pain Assessment Pain Scale: 0-10 Pain Score: 7  Pain Type: Acute pain Pain Location: Leg Pain Orientation: Right;Left Pain Descriptors / Indicators: Aching Pain Frequency: Intermittent Pain Onset: Gradual Pain Intervention(s): Medication (See eMAR) ADL: ADL Grooming: Setup Where Assessed-Grooming: Sitting at sink Upper Body Bathing: Minimal assistance Where Assessed-Upper Body Bathing: Sitting at sink Lower Body Bathing: Maximal assistance Where Assessed-Lower Body Bathing: Sitting at sink Upper Body Dressing: Minimal assistance Where Assessed-Upper Body Dressing: Sitting at sink Lower Body Dressing: (+2) Where Assessed-Lower Body Dressing: Sitting at sink, Standing at sink ADL Comments: Unable to complete transfers on eval due to pain limiting mobility      Therapy/Group: Individual Therapy  Oscar Baker A Oscar Baker 03/25/2019, 4:28 PM

## 2019-03-26 ENCOUNTER — Inpatient Hospital Stay (HOSPITAL_COMMUNITY): Payer: Medicare Other | Admitting: Physical Therapy

## 2019-03-26 ENCOUNTER — Inpatient Hospital Stay (HOSPITAL_COMMUNITY): Payer: Medicare Other

## 2019-03-26 LAB — PROTIME-INR
INR: 2.3 — ABNORMAL HIGH (ref 0.8–1.2)
Prothrombin Time: 25 seconds — ABNORMAL HIGH (ref 11.4–15.2)

## 2019-03-26 LAB — GLUCOSE, CAPILLARY
Glucose-Capillary: 51 mg/dL — ABNORMAL LOW (ref 70–99)
Glucose-Capillary: 82 mg/dL (ref 70–99)
Glucose-Capillary: 70 mg/dL (ref 70–99)
Glucose-Capillary: 102 mg/dL — ABNORMAL HIGH (ref 70–99)
Glucose-Capillary: 116 mg/dL — ABNORMAL HIGH (ref 70–99)

## 2019-03-26 MED ORDER — TRAMADOL HCL 50 MG PO TABS
50.0000 mg | ORAL_TABLET | Freq: Four times a day (QID) | ORAL | Status: DC | PRN
  Administered 2019-03-29 – 2019-04-06 (×10): 50 mg via ORAL
  Filled 2019-03-26 (×11): qty 1

## 2019-03-26 MED ORDER — WARFARIN SODIUM 5 MG PO TABS
5.0000 mg | ORAL_TABLET | Freq: Once | ORAL | Status: AC
  Administered 2019-03-26: 5 mg via ORAL
  Filled 2019-03-26: qty 1

## 2019-03-26 MED ORDER — INSULIN GLARGINE 100 UNIT/ML ~~LOC~~ SOLN
36.0000 [IU] | Freq: Every day | SUBCUTANEOUS | Status: DC
  Administered 2019-03-27: 11:00:00 36 [IU] via SUBCUTANEOUS
  Filled 2019-03-26: qty 0.36

## 2019-03-26 NOTE — Progress Notes (Addendum)
Today is second morning in a row patient blood sugar level is low. (today 51). HS snack was given last night. Notified Dr. Dagoberto Ligas via phone.   Patient continues to complain of back pain, restless. Pt states "bed is uncomfortable" Pain medication administered, repositioned.

## 2019-03-26 NOTE — Progress Notes (Signed)
Patient uses condom caths at home during day. Sat condom cath came off during 1st therapy and patient used brief for therapy and time between, then replaced condom cath after last therapy and overnight.  Nancy Fetter, condom cath came off in first therapy and patient requested second condom cath placed prior to next therapy. That cath came off during second therapy.  Educated patient that condom cath may be useful after therapy/over night even though he usually uses diaper at night at home to prevent skin breakdown. Discouraged use of condom cath during therapy because of frequent accidents. Will continue to monitor.

## 2019-03-26 NOTE — Progress Notes (Signed)
Oscar Baker for warfarin Indication: atrial fibrillation  Patient Measurements: Height: 5' 5.98" (167.6 cm) Weight: 202 lb 2.6 oz (91.7 kg) IBW/kg (Calculated) : 63.75  Vital Signs: Temp: 98.8 F (37.1 C) (10/04 0555) Temp Source: Oral (10/04 0555) BP: 108/52 (10/04 0913) Pulse Rate: 60 (10/04 0913)  Labs: Recent Labs    03/24/19 0627 03/25/19 0551 03/26/19 0615  LABPROT 26.5* 24.0* 25.0*  INR 2.5* 2.2* 2.3*    Estimated Creatinine Clearance: 56.6 mL/min (by C-G formula based on SCr of 1.03 mg/dL).   Assessment: Patient on warfarin for atrial fibrillation. PTA Warfarin dosing: 5 mg daily except 7.5 mg on Tues and Sat. Warfarin was resumed on 9/23 s/p THA surgery. No bleeding noted. INR is therapeutic at 2.2. Based on total weekly dose, over the past 7 days, patient should require about 5 mg/day.   Goal of Therapy:  INR 2-3 Monitor platelets by anticoagulation protocol: Yes    Plan:  - Warfarin 5 mg po x1 - Daily INR   Harvel Quale 03/26/2019 9:32 AM

## 2019-03-26 NOTE — Progress Notes (Signed)
[facility] PHYSICAL MEDICINE & REHABILITATION PROGRESS NOTE   Subjective/Complaints:  Pt reports BGs have been going low- this AM got to low 60s x1. Back kineseotape has been rolling and causing discomfort- also buttock spots are very painful- the worst pain having now- pain overall much better controlled. Wants to get away from [place] if possible.  Nursing asking for air mattress overlay- good idea for buttocks pain.  Will decrease Lantus due to low BG and order snack.     ROS_ pt denies CP, N/V/D- feels less SOB than yesterday; frustrated about bowels.  Objective:   No results found. No results for input(s): WBC, HGB, HCT, PLT in the last 72 hours. No results for input(s): NA, K, CL, CO2, GLUCOSE, BUN, CREATININE, CALCIUM in the last 72 hours.  Intake/Output Summary (Last 24 hours) at 03/26/2019 1129 Last data filed at 03/26/2019 0348 Gross per 24 hour  Intake 240 ml  Output 2250 ml  Net -2010 ml     Physical Exam: Vital Signs Blood pressure (!) 108/52, pulse 60, temperature 98.8 F (37.1 C), temperature source Oral, resp. rate 17, height 5' 5.98" (1.676 m), weight 91.7 kg, SpO2 92 %.   Physical Exam Nursing note and vitalsreviewed. Constitutional:  Awake, alert, appropriate, very HOH, OT at bedside; in manual w/c at bedside; wife in room; brighter affect; fewer complaints, NAD HENT:  Head:Macrocephalic. Healing abrasion on left cheek-appears more like birth mark? CV- regular rate; IRR Respiratory:Effort normal. congested cough chronic-more Clear today- good air movement- s/p Duonebs tx GI: . There isno abdominal tenderness.NT; BS(+) Musculoskeletal:  Strength- 5/5 in UEs and B/L LLE HF 3/5, otherwise 4+/5 distally in KE/KF/DF and PF RLE- HF 1/5 due to pain; distally 4+/5 in KE/KF/DF and PF Comments: Right flank with very large hematoma that's purple; a smaller bruise but larger hematoma on R lateral mid back with protrusion of skin by hematoma.  Right  hip incision with dry dressing. Neurological: He is alertand oriented to person, place, and time.  Skin: Skin iswarmand dry. wearing depends from overnight.   Assessment/Plan: 1. Functional deficits secondary to R periprosthetic hip fracture due to ground level fall and poor balance which require 3+ hours per day of interdisciplinary therapy in a comprehensive inpatient rehab setting.  Physiatrist is providing close team supervision and 24 hour management of active medical problems listed below.  Physiatrist and rehab team continue to assess barriers to discharge/monitor patient progress toward functional and medical goals  Care Tool:  Bathing    Body parts bathed by patient: Right arm, Left arm, Chest, Abdomen, Front perineal area, Right upper leg, Left upper leg, Face   Body parts bathed by helper: Right lower leg, Left lower leg, Buttocks     Bathing assist Assist Level: 2 Helpers     Upper Body Dressing/Undressing Upper body dressing   What is the patient wearing?: Hospital gown only    Upper body assist Assist Level: Set up assist    Lower Body Dressing/Undressing Lower body dressing      What is the patient wearing?: Pants     Lower body assist Assist for lower body dressing: 2 Helpers     Toileting Toileting    Toileting assist Assist for toileting: 2 Helpers     Transfers Chair/bed transfer  Transfers assist     Chair/bed transfer assist level: 2 Helpers Chair/bed transfer assistive device: Sliding board   Locomotion Ambulation   Ambulation assist   Ambulation activity did not occur: Safety/medical concerns(increased pain/decreased  activity tolerance)          Walk 10 feet activity   Assist  Walk 10 feet activity did not occur: Safety/medical concerns(increased pain/decreased activity tolerance)        Walk 50 feet activity   Assist Walk 50 feet with 2 turns activity did not occur: Safety/medical concerns(increased  pain/decreased activity tolerance)         Walk 150 feet activity   Assist Walk 150 feet activity did not occur: Safety/medical concerns(increased pain/decreased activity tolerance)         Walk 10 feet on uneven surface  activity   Assist Walk 10 feet on uneven surfaces activity did not occur: Safety/medical concerns(increased pain/decreased activity tolerance)         Wheelchair     Assist   Type of Wheelchair: Manual    Wheelchair assist level: Supervision/Verbal cueing, Set up assist Max wheelchair distance: 105'    Wheelchair 50 feet with 2 turns activity    Assist    Wheelchair 50 feet with 2 turns activity did not occur: Safety/medical concerns(increased pain/decreased activity tolerance)   Assist Level: Set up assist, Supervision/Verbal cueing   Wheelchair 150 feet activity     Assist  Wheelchair 150 feet activity did not occur: Safety/medical concerns(increased pain/decreased activity tolerance)       Blood pressure (!) 108/52, pulse 60, temperature 98.8 F (37.1 C), temperature source Oral, resp. rate 17, height 5' 5.98" (1.676 m), weight 91.7 kg, SpO2 92 %.    Medical Problem List and Plan: 1. R hip periprosthetic fracture s/p ORIF secondary to ground level fall at home due to poor balance.            -will need 15/7 for therapy due to poor movement.  -10/4- progressing with therapy, per staff- is doing [facility] better today.  2. Antithrombotics: -DVT/anticoagulation:Pharmaceutical:Coumadin -antiplatelet therapy: on coumadin for Afib- INR 2.0  9/30- INR 2.5- managed by pharmacy  10/1- INR 2.5 and stable  10/3- INR 2.2  10/4- INR 2.3 3. Pain Management:continue gabapentin tid for neuropathy and Norco 7/5/325 mg q4 hours prn for pain  9/29- pain doing OK per pt currently- might need increase in Norco dose, possibly- will monitor  10/3- wife concerned might need increased pain med dose- will change Norco so can get  7.5 OR 10 mg depending on pain level.  10/4- will add Tramadol 50 mg q6 hours prn if doesn't want to take Norco.  4. Mood:LCSW to follow for evaluation and support. -antipsychotic agents: N/A 5. Neuropsych: This patient is capable of making decisions on his own behalf. 6. Skin/Wound Care:Monitor wound for healing. Continue nutritional supplement to promote healing.            -did entire body skin check- has 2 large hematomas and a R hip incision- will need kinesiotaping for 1 of hematomas 7. Fluids/Electrolytes/Nutrition:Monitor I/O. Check lytes in am. 8. Chronic A FIB: Monitor HR tid--continue coreg. Monitor for orthostatic symptoms.  9/29- INR 2.0- will d/c IV 9. CLL: WBC trending back down- is normally in 20s.   9/29- stable at 41k- will con't to monitor- if contues to increase, will call Heme/onc.  10/1- down to 40.3k- considering, has been stable for last few days. 10 ABLA: Continue to monitor H/H--stabilizing around 8. On iron supplement.   10/1- calls iron pill his "birth control pill"  11. T2DM: On lantus 35 units daily with SSI for elevated BS. Monitor BS ac/hs and titrate lantus as indicated.  9/29- BGs adequate control for now- will monitor  10/3- too low BGs- will decrease Lantus to 38 units from 40 units, do the BG of 60s  10/4- Decrease Lantus to 36 units because BG in low 60s again. CBG (last 3)  Recent Labs    03/25/19 2152 03/26/19 0556 03/26/19 0651  GLUCAP 130* 51* 82    12.Hyponatremia: Will check follow up labs in am.  9/29- labs normalized 13. Chronic cough (due to chronic aspiration): per records has refused dysphagia diet. On duonebs tid. On Neurontin for cough? Keep HOB>30 degrees  10/2- reordered Duonebs TID since resp d/c'd AND made prn as well, so doesn't miss a dose with therapy.  10/3- informed pt and wife to ask for prn dose if misses due to therapy.   14. Constipation due to opioids- will schedule Senokot S 2 tabs BID and use  prns as necessary,  9/30- will d/c Colace, increase miralax ot BID, and con't Senokot S 2 tabs BID  10/1- will give Mg citrate after therapy and follow with Suppository if required  10/2- fixed-/resolved- will decrease Senokot S to 1 tab BID and miralax to daily.   10/3- will decrease Senokot-S to 1 tab/day- can't stop everything because on opioids.    10/4- no complaints today.  15. Poor balance            - will have vestibular PT evaluation done and see if can be helped at all. Also told pt to drink a lot- he doesn't like to because condom cath.  9/29- d/w PT- she will make sure it's done.    LOS: 6 days A FACE TO FACE EVALUATION WAS PERFORMED  Asal Teas 03/26/2019, 11:29 AM

## 2019-03-26 NOTE — Progress Notes (Signed)
Occupational Therapy Session Note  Patient Details  Name: Oscar Baker MRN: [id number] Date of Birth: May 22, 1933  Today's Date: 03/26/2019 OT Individual Time: [id number] OT Individual Time Calculation (min): 75 min    Short Term Goals: Week 1:  OT Short Term Goal 1 (Week 1): Pt will complete sit > stand as needed for LB self-care with mod assist of one caregiver OT Short Term Goal 2 (Week 1): Pt will complete bathing with mod assist of one caregiver OT Short Term Goal 3 (Week 1): Pt will complete LB dressing with mod assist of one caregiver with AE as needed OT Short Term Goal 4 (Week 1): Pt will complete toilet transfer with max assist of one caregiver  Skilled Therapeutic Interventions/Progress Updates:    Pt asleep in bed upon arrival and easily aroused.  Pt ready to get OOB, stating that bed is very uncomfortable.  Kinesio tape had rolled up and was irritating.  Kinesio tape applied to hematoma on back. Pt sat EOB with min A using bed rails-minimal assistance moving RLE to EOB. No increase in pain noted. Sit>stand from EOB with mod A and stand pivot transfer to w/c with min A and min verbal cues for sequencing. Pt changed shirt while seated in w/c.  Pt performed sit<>stand X 2 at sink for grooming tasks.  Pt remained seated in w/c to brush teeth. Sit<>stand from w/c with mod A and CGA for standing balance at sink.  No dizziness noted. Pt propelled w/c to position beside bed and engaged in BUE threx with orange Theraband.  Pt demonstrated exercises from previous day (BUE punches, chest presses).  Diagonal demonstrated and pt return demonstrated. Pt remained seated in w/c with all needs within reach.   Therapy Documentation Precautions:  Precautions Precautions: Fall Restrictions Weight Bearing Restrictions: No RLE Weight Bearing: Partial weight bearing RLE Partial Weight Bearing Percentage or Pounds: 50 Pain: Pt states his pain is "so much better" this morning; Kinesio tape applied to  hematoma mid back   Therapy/Group: Individual Therapy  [name] 03/26/2019, 10:08 AM

## 2019-03-26 NOTE — Progress Notes (Addendum)
Hypoglycemic Event  CBG: 51  Treatment: grape juice  Symptoms: asymptomatic   Follow-up CBG: Time: 0651 CBG Result: 82  Possible Reasons for Event:  Comments/MD notified: Dr. Tonye Becket

## 2019-03-26 NOTE — Discharge Instructions (Addendum)
Inpatient Rehab Discharge Instructions  Oscar Baker Discharge date and time:   04/11/19  Activities/Precautions/ Functional Status: Activity: no lifting, driving, or strenuous exercise for cleared by MD.  Partial weight on left leg till cleared by Dr. Alvan Dame.  Diet: cardiac diet and diabetic diet Wound Care: keep wound clean and dry   Functional status:  ___ No restrictions     ___ Walk up steps independently _X__ 24/7 supervision/assistance   ___ Walk up steps with assistance ___ Intermittent supervision/assistance  ___ Bathe/dress independently ___ Walk with walker     ___ Bathe/dress with assistance ___ Walk Independently    ___ Shower independently ___ Walk with assistance    _X__ Shower with assistance _X__ No alcohol     ___ Return to work/school ________   Special Instructions: 1. Check blood sugars before meals and at bedtime for next 1-2 weeks for till follow up with Dr. Virgina Jock. Note decrease in metformin dose. You are not taking glucotrol or Januvia at this time--your blood sugars have been ranging 90-150 while in the hospital.  2. Partial weigh on left leg till cleared by Dr. Alvan Dame.  3. HHRN to draw protime on Friday 10/23 with results to [facility] coumadin clinic.   COMMUNITY REFERRALS UPON DISCHARGE:    Home Health:   PT, OT, RN  Agency: [facility] Phone:  [phone] Date of last service:04/11/2019  Medical Equipment/Items [place]  Agency/Supplier:[facility]   [phone]  My questions have been answered and I understand these instructions. I will adhere to these goals and the provided educational materials after my discharge from the hospital.  Patient/Caregiver Signature _______________________________ Date __________  Clinician Signature _______________________________________ Date __________  Please bring this form and your medication list with you to all your follow-up doctor's appointments.    Information on my medicine -  Coumadin   (Warfarin)  Why was Coumadin prescribed for you? Coumadin was prescribed for you because you have a blood clot or a medical condition that can cause an increased risk of forming blood clots. Blood clots can cause serious health problems by blocking the flow of blood to the heart, lung, or brain. Coumadin can prevent harmful blood clots from forming. As a reminder your indication for Coumadin is:   Stroke Prevention Because Of Atrial Fibrillation  What test will check on my response to Coumadin? While on Coumadin (warfarin) you will need to have an INR test regularly to ensure that your dose is keeping you in the desired range. The INR (international normalized ratio) number is calculated from the result of the laboratory test called prothrombin time (PT).  If an INR APPOINTMENT HAS NOT ALREADY BEEN MADE FOR YOU please schedule an appointment to have this lab work done by your health care provider within 7 days. Your INR goal is usually a number between:  2 to 3 or your provider may give you a more narrow range like 2-2.5.  Ask your health care provider during an office visit what your goal INR is.  What  do you need to  know  About  COUMADIN? Take Coumadin (warfarin) exactly as prescribed by your healthcare provider about the same time each day.  DO NOT stop taking without talking to the doctor who prescribed the medication.  Stopping without other blood clot prevention medication to take the place of Coumadin may increase your risk of developing a new clot or stroke.  Get refills before you run out.  What do you do if you  miss a dose? If you miss a dose, take it as soon as you remember on the same day then continue your regularly scheduled regimen the next day.  Do not take two doses of Coumadin at the same time.  Important Safety Information A possible side effect of Coumadin (Warfarin) is an increased risk of bleeding. You should call your healthcare provider right away if you  experience any of the following: ? Bleeding from an injury or your nose that does not stop. ? Unusual colored urine (red or dark brown) or unusual colored stools (red or black). ? Unusual bruising for unknown reasons. ? A serious fall or if you hit your head (even if there is no bleeding).  Some foods or medicines interact with Coumadin (warfarin) and might alter your response to warfarin. To help avoid this: ? Eat a balanced diet, maintaining a consistent amount of Vitamin K. ? Notify your provider about major diet changes you plan to make. ? Avoid alcohol or limit your intake to 1 drink for women and 2 drinks for men per day. (1 drink is 5 oz. wine, 12 oz. beer, or 1.5 oz. liquor.)  Make sure that ANY health care provider who prescribes medication for you knows that you are taking Coumadin (warfarin).  Also make sure the healthcare provider who is monitoring your Coumadin knows when you have started a new medication including herbals and non-prescription products.  Coumadin (Warfarin)  Major Drug Interactions  Increased Warfarin Effect Decreased Warfarin Effect  Alcohol (large quantities) Antibiotics (esp. Septra/Bactrim, Flagyl, Cipro) Amiodarone (Cordarone) Aspirin (ASA) Cimetidine (Tagamet) Megestrol (Megace) NSAIDs (ibuprofen, naproxen, etc.) Piroxicam (Feldene) Propafenone (Rythmol SR) Propranolol (Inderal) Isoniazid (INH) Posaconazole (Noxafil) Barbiturates (Phenobarbital) Carbamazepine (Tegretol) Chlordiazepoxide (Librium) Cholestyramine (Questran) Griseofulvin Oral Contraceptives Rifampin Sucralfate (Carafate) Vitamin K   Coumadin (Warfarin) Major Herbal Interactions  Increased Warfarin Effect Decreased Warfarin Effect  Garlic Ginseng Ginkgo biloba Coenzyme Q10 Green tea St. Johns wort    Coumadin (Warfarin) FOOD Interactions  Eat a consistent number of servings per week of foods HIGH in Vitamin K (1 serving =  cup)  Collards (cooked, or boiled &  drained) Kale (cooked, or boiled & drained) Mustard greens (cooked, or boiled & drained) Parsley *serving size only =  cup Spinach (cooked, or boiled & drained) Swiss chard (cooked, or boiled & drained) Turnip greens (cooked, or boiled & drained)  Eat a consistent number of servings per week of foods MEDIUM-HIGH in Vitamin K (1 serving = 1 cup)  Asparagus (cooked, or boiled & drained) Broccoli (cooked, boiled & drained, or raw & chopped) Brussel sprouts (cooked, or boiled & drained) *serving size only =  cup Lettuce, raw (green leaf, endive, romaine) Spinach, raw Turnip greens, raw & chopped   These websites have more information on Coumadin (warfarin):  FailFactory.se; VeganReport.com.au;

## 2019-03-26 NOTE — Progress Notes (Signed)
Physical Therapy Session Note  Patient Details  Name: Oscar Baker MRN: [id number] Date of Birth: Nov 16, 1932  Today's Date: 03/26/2019 PT Individual Time: 1300-1405 PT Individual Time Calculation (min): 65 min   Short Term Goals: Week 1:  PT Short Term Goal 1 (Week 1): Patient will perform basic transfer using LRAD consistenty with mod A of 1 person. PT Short Term Goal 2 (Week 1): Patient will perform sit<>supine with mod A. PT Short Term Goal 3 (Week 1): Patient will propel w/c >50 feet.  Skilled Therapeutic Interventions/Progress Updates:     Patient in w/c with his wife in the room upon PT arrival. Patient alert and agreeable to PT session. Patient reported 3-4/10 mid back pain, RN made aware and provided pain medication during session. PT provided repositioning, rest breaks, and distraction as pain interventions throughout session.   Therapeutic Activity: Transfers: Patient performed stand pivot w/c<>bed using the RW and sit to/from stand x1 using the RW with mod A. Provided verbal cues for R UE on the RW and L UE pushing up form the bed/armrest, leaning forward to stand, and reaching back with his L hand to control descent with increased weight bearing on his L LE to maintain PWB on R LE. Educated nursing staff on stand pivot transfers during session, due to difficulty with transfers earlier during the day. Patient has is unable to step with his L LE at this time, despite several cues for off-weighting his R LE with his UEs and shifting his weight to the R to step. Demonstrated patient's ability to pivot with min A using RW for nursing staff and moving the w/c behind him to sit when pivoting to the R. Attempted using the Stedy to transfer x1 without success due to increased R LE pain when attempting to stand.  During patient's first stand his condom catheter fell off and soiled the patient's pants, TED hose, and socks. PT removed soiled clothing, performed LB bathing, and donned a new  brief, paper scrubs, TEDs, and non-skid socks with total A during session, RN made aware and patient, RN, and PT came to an agreement about no longer using the condom catheter and using briefs during patient's stay due to frequency of the catheter coming off with therapy limiting sessions.  Patient sat EOB with supervision and single UE support x20 min during session while PT assisted with bathing and dressing.   Patient in w/c at end of session with breaks locked, bed alarm set, and all needs within reach.    Therapy Documentation Precautions:  Precautions Precautions: Fall Restrictions Weight Bearing Restrictions: No RLE Weight Bearing: Partial weight bearing RLE Partial Weight Bearing Percentage or Pounds: 50    Therapy/Group: Individual Therapy  Tigran Haynie L Jonny Dearden PT, DPT  03/26/2019, 4:03 PM

## 2019-03-27 ENCOUNTER — Inpatient Hospital Stay (HOSPITAL_COMMUNITY): Payer: Medicare Other

## 2019-03-27 ENCOUNTER — Inpatient Hospital Stay (HOSPITAL_COMMUNITY): Payer: Medicare Other | Admitting: Occupational Therapy

## 2019-03-27 DIAGNOSIS — E875 Hyperkalemia: Secondary | ICD-10-CM

## 2019-03-27 DIAGNOSIS — E162 Hypoglycemia, unspecified: Secondary | ICD-10-CM

## 2019-03-27 DIAGNOSIS — S72002S Fracture of unspecified part of neck of left femur, sequela: Secondary | ICD-10-CM

## 2019-03-27 DIAGNOSIS — E1165 Type 2 diabetes mellitus with hyperglycemia: Secondary | ICD-10-CM

## 2019-03-27 DIAGNOSIS — M9701XS Periprosthetic fracture around internal prosthetic right hip joint, sequela: Secondary | ICD-10-CM

## 2019-03-27 DIAGNOSIS — D62 Acute posthemorrhagic anemia: Secondary | ICD-10-CM

## 2019-03-27 DIAGNOSIS — K5903 Drug induced constipation: Secondary | ICD-10-CM

## 2019-03-27 LAB — BASIC METABOLIC PANEL
Anion gap: 5 (ref 5–15)
BUN: 13 mg/dL (ref 8–23)
CO2: 27 mmol/L (ref 22–32)
Calcium: 8.9 mg/dL (ref 8.9–10.3)
Chloride: 105 mmol/L (ref 98–111)
Creatinine, Ser: 0.89 mg/dL (ref 0.61–1.24)
GFR calc Af Amer: >60 mL/min (ref >60)
GFR calc non Af Amer: >60 mL/min (ref >60)
Glucose, Bld: 74 mg/dL (ref 70–99)
Potassium: 5.1 mmol/L (ref 3.5–5.1)
Sodium: 137 mmol/L (ref 135–145)

## 2019-03-27 LAB — CBC
HCT: 31.3 % — ABNORMAL LOW (ref 39.0–52.0)
Hemoglobin: 10.2 g/dL — ABNORMAL LOW (ref 13.0–17.0)
MCH: 33.7 pg (ref 26.0–34.0)
MCHC: 32.6 g/dL (ref 30.0–36.0)
MCV: 103.3 fL — ABNORMAL HIGH (ref 80.0–100.0)
Platelets: 553 10*3/uL — ABNORMAL HIGH (ref 150–400)
RBC: 3.03 MIL/uL — ABNORMAL LOW (ref 4.22–5.81)
RDW: 20.7 % — ABNORMAL HIGH (ref 11.5–15.5)
WBC: 32.6 10*3/uL — ABNORMAL HIGH (ref 4.0–10.5)
nRBC: 0 % (ref 0.0–0.2)

## 2019-03-27 LAB — PROTIME-INR
INR: 2 — ABNORMAL HIGH (ref 0.8–1.2)
Prothrombin Time: 22.5 seconds — ABNORMAL HIGH (ref 11.4–15.2)

## 2019-03-27 LAB — GLUCOSE, CAPILLARY
Glucose-Capillary: 49 mg/dL — ABNORMAL LOW (ref 70–99)
Glucose-Capillary: 82 mg/dL (ref 70–99)
Glucose-Capillary: 129 mg/dL — ABNORMAL HIGH (ref 70–99)
Glucose-Capillary: 119 mg/dL — ABNORMAL HIGH (ref 70–99)
Glucose-Capillary: 195 mg/dL — ABNORMAL HIGH (ref 70–99)

## 2019-03-27 MED ORDER — WARFARIN SODIUM 7.5 MG PO TABS
7.5000 mg | ORAL_TABLET | Freq: Once | ORAL | Status: AC
  Administered 2019-03-27: 7.5 mg via ORAL
  Filled 2019-03-27: qty 1

## 2019-03-27 MED ORDER — INSULIN GLARGINE 100 UNIT/ML ~~LOC~~ SOLN
22.0000 [IU] | Freq: Every day | SUBCUTANEOUS | Status: DC
  Administered 2019-03-28 – 2019-04-01 (×5): 22 [IU] via SUBCUTANEOUS
  Filled 2019-03-27 (×6): qty 0.22

## 2019-03-27 NOTE — Progress Notes (Signed)
Occupational Therapy Session Note  Patient Details  Name: Oscar Baker MRN: [id number] Date of Birth: 1933-03-31  Today's Date: 03/27/2019 OT Individual Time: 1300-1345 OT Individual Time Calculation (min): 45 min    Short Term Goals: Week 1:  OT Short Term Goal 1 (Week 1): Pt will complete sit > stand as needed for LB self-care with mod assist of one caregiver OT Short Term Goal 2 (Week 1): Pt will complete bathing with mod assist of one caregiver OT Short Term Goal 3 (Week 1): Pt will complete LB dressing with mod assist of one caregiver with AE as needed OT Short Term Goal 4 (Week 1): Pt will complete toilet transfer with max assist of one caregiver  Skilled Therapeutic Interventions/Progress Updates:    Patient seated in w/c and ready for pm therapy session.  Agreeable to work on standing and taking steps with RW - sit to stand min/mod A of 2, CGA for ambulation with RW (first walk 8-10 feet, second walk 19-20 feet) with w/c follow.  He is able to bear weight through bilateral UEs and maintain PWB.  Sit to stand with walker to change wet brief - min a of 2 to stand, dependent to change brief and complete hygiene, CGA of 2 to turn to recliner with RW.  He sat comfortably in recliner at close of session with wife present, ice pack provided, call bell and tray table in reach.    Therapy Documentation Precautions:  Precautions Precautions: Fall Restrictions Weight Bearing Restrictions: No RLE Weight Bearing: Partial weight bearing RLE Partial Weight Bearing Percentage or Pounds: 50 General:   Vital Signs: Therapy Vitals Temp: 97.9 F (36.6 C) Temp Source: Oral Pulse Rate: 67 Resp: 20 BP: (!) 114/58 Patient Position (if appropriate): Lying Oxygen Therapy SpO2: 96 % O2 Device: Room Air Pain: Pain Assessment Pain Scale: 0-10 Pain Score: 2  Pain Location: Leg Pain Orientation: Right Pain Descriptors / Indicators: Tender Pain Intervention(s): Repositioned;Cold  applied Other Treatments:     Therapy/Group: Individual Therapy  Carlos Levering 03/27/2019, 4:15 PM

## 2019-03-27 NOTE — Progress Notes (Signed)
Physical Therapy Session Note  Patient Details  Name: Oscar Baker MRN: [id number] Date of Birth: 06/14/1933  Today's Date: 03/27/2019 PT Individual Time: [id number] PT Individual Time Calculation (min): 30 min   Short Term Goals: Week 1:  PT Short Term Goal 1 (Week 1): Patient will perform basic transfer using LRAD consistenty with mod A of 1 person. PT Short Term Goal 2 (Week 1): Patient will perform sit<>supine with mod A. PT Short Term Goal 3 (Week 1): Patient will propel w/c >50 feet.   Skilled Therapeutic Interventions/Progress Updates:     Patient in bed with his wife at bedside upon PT arrival. Patient alert and agreeable to PT session. Patient denied pain at rest, reported 7-8/10 L hip pain during therapeutic exercises that went away with rest. PT provided repositioning, rest breaks, and distraction as pain interventions throughout session. Patient was incontinent in his brief at beginning of session. Patient and his wife expressed frustration about nursing management of patient's incontinence. Discussed using a condom catheter at night to manage incontinence and brief during the day for therapies, patient and his wife in agreement. RN made aware. Respatory therapy came to provide patient with a breathing treatment during session. Patient received breathing treatment while getting cleaned up during session.   Therapeutic Activity: Bed Mobility: Patient performed rolling R/L with min A with use of bed rails x3 while PT changed his brief and performed peri-car with total A. Provided verbal cues for crossing R LE over L to roll L and bending L leg up to push to roll R.  Therapeutic Exercise: Patient performed the following exercises with verbal and tactile cues for proper technique. -L knee and hip flexion with 50% assist x10 -L LSR with 50% assist x10 -L hip abduction/adduction in supine with 50% assit x10  Patient in bed with this wife at bedsideat end of session with breaks  locked, bed alarm set, and all needs within reach.    Therapy Documentation Precautions:  Precautions Precautions: Fall Restrictions Weight Bearing Restrictions: No RLE Weight Bearing: Partial weight bearing RLE Partial Weight Bearing Percentage or Pounds: 50    Therapy/Group: Individual Therapy  Tatijana Bierly L Jessyka Austria PT, DPT  03/27/2019, 12:19 PM

## 2019-03-27 NOTE — Progress Notes (Signed)
Discussed concerns regarding low BS and need to adjust insulin with patient and wife. Wife reports that patient was very non compliant with dietary restrictions and is not eating at times due to limited food choices--"tired of carrots and green beans every day" . Discussed options with ambassador--will liberalize diet to low salt which will allow him the choice of meats and she will check with supervisor to allow patient to have more choices (blue menu)

## 2019-03-27 NOTE — Progress Notes (Signed)
Occupational Therapy Session Note  Patient Details  Name: Oscar Baker MRN: [id number] Date of Birth: 04/18/1933  Today's Date: 03/27/2019 OT Individual Time: 1030-1130 OT Individual Time Calculation (min): 60 min    Short Term Goals: Week 1:  OT Short Term Goal 1 (Week 1): Pt will complete sit > stand as needed for LB self-care with mod assist of one caregiver OT Short Term Goal 2 (Week 1): Pt will complete bathing with mod assist of one caregiver OT Short Term Goal 3 (Week 1): Pt will complete LB dressing with mod assist of one caregiver with AE as needed OT Short Term Goal 4 (Week 1): Pt will complete toilet transfer with max assist of one caregiver  Skilled Therapeutic Interventions/Progress Updates:    Patient in bed, wife present.  Patient requires encouragement to donn clothing due to incontinent episodes.  Finally agreed to wear hospital paper scrubs.  LB dressing completed bed level - max A for hygiene and to change wet brief.  Max A to donn pants.  UB dressing completed w/c level with set up.  Rolling in bed min a.  Side lying to SSP with max A.  Sit to stand and SPT bed to w/c mod A of one with stand by of 2nd person.  Max A/dep for donning TEDS and slipper socks.  Patient declined grooming tasks at this time.  Completed sit to stand with RW and single step activity with focus on maintaining PWB and use of UEs to provide support - mod cues and CGA for 2 reps.  Patient is SOB upon sitting.  He declined UB conditioning and breath support/trunk/posture activities.  Completed leg kicks and repositioning in w/c with CS.  Patient remained seated in w/c at close of session - ice pack provided for right hip.  refused seat belt alarm, wife present to monitor.  Call bell, suction and tray table in reach.  Leg rests removed with his insistence.   Therapy Documentation Precautions:  Precautions Precautions: Fall Restrictions Weight Bearing Restrictions: No RLE Weight Bearing: Partial weight  bearing RLE Partial Weight Bearing Percentage or Pounds: 50 General:   Vital Signs: Therapy Vitals Pulse Rate: 66 Oxygen Therapy SpO2: 94 % O2 Device: Room Air Pain: Pain Assessment Pain Scale: 0-10 Pain Score: 2  Faces Pain Scale: No hurt Pain Location: Leg Pain Orientation: Right Pain Descriptors / Indicators: Tender Pain Intervention(s): Repositioned;Cold applied Other Treatments:     Therapy/Group: Individual Therapy  Oscar Baker 03/27/2019, 12:20 PM

## 2019-03-27 NOTE — Progress Notes (Signed)
ANTICOAGULATION CONSULT NOTE  Pharmacy Consult for warfarin Indication: atrial fibrillation  Patient Measurements: Height: 5' 5.98" (167.6 cm) Weight: 202 lb 2.6 oz (91.7 kg) IBW/kg (Calculated) : 63.75  Vital Signs: Temp: 97.8 F (36.6 C) (10/05 0413) Temp Source: Oral (10/05 0413) BP: 122/65 (10/05 0413) Pulse Rate: 60 (10/05 0413)  Labs: Recent Labs    03/25/19 0551 03/26/19 0615 03/27/19 0612  HGB  --   --  10.2*  HCT  --   --  31.3*  PLT  --   --  553*  LABPROT 24.0* 25.0* 22.5*  INR 2.2* 2.3* 2.0*  CREATININE  --   --  0.89    Estimated Creatinine Clearance: 65.5 mL/min (by C-G formula based on SCr of 0.89 mg/dL).   Assessment: Patient on warfarin for atrial fibrillation. PTA Warfarin dosing: 5 mg daily except 7.5 mg on Tues and Sat. Warfarin was resumed on 9/23 s/p THA surgery. No bleeding noted. INR is therapeutic at 2.   Goal of Therapy:  INR 2-3 Monitor platelets by anticoagulation protocol: Yes   Plan:  Warfarin 7.5mg  PO x 1 tonight Daily iNR   Tirza Senteno, Rande Lawman 03/27/2019 7:55 AM

## 2019-03-27 NOTE — Plan of Care (Signed)
  Problem: Consults Goal: Diabetes Guidelines if Diabetic/Glucose > 140 Description: If diabetic or lab glucose is > 140 mg/dl - Initiate Diabetes/Hyperglycemia Guidelines & Document Interventions  Outcome: Progressing   Problem: RH BOWEL ELIMINATION Goal: RH STG MANAGE BOWEL W/MEDICATION W/ASSISTANCE Description: STG Manage Bowel with Medication with min Assistance. Outcome: Progressing   Problem: RH SKIN INTEGRITY Goal: RH STG MAINTAIN SKIN INTEGRITY WITH ASSISTANCE Description: STG Maintain Skin Integrity With mod Assistance. Outcome: Progressing   Problem: RH SAFETY Goal: RH STG ADHERE TO SAFETY PRECAUTIONS W/ASSISTANCE/DEVICE Description: STG Adhere to Safety Precautions With supervision Assistance/Device. Outcome: Progressing   Problem: RH PAIN MANAGEMENT Goal: RH STG PAIN MANAGED AT OR BELOW PT'S PAIN GOAL Description: < 4 Outcome: Not Progressing

## 2019-03-27 NOTE — Progress Notes (Signed)
PHYSICAL MEDICINE & REHABILITATION PROGRESS NOTE   Subjective/Complaints: Patient seen sitting up in bed this morning working with therapies.  He states he did not sleep well overnight due to urinary incontinence and swelling the bed.  ROS: Denies CP, SOB, N/V/D  Objective:   No results found. Recent Labs    03/27/19 0612  WBC 32.6*  HGB 10.2*  HCT 31.3*  PLT 553*   Recent Labs    03/27/19 0612  NA 137  K 5.1  CL 105  CO2 27  GLUCOSE 74  BUN 13  CREATININE 0.89  CALCIUM 8.9    Intake/Output Summary (Last 24 hours) at 03/27/2019 1113 Last data filed at 03/27/2019 0730 Gross per 24 hour  Intake 1312 ml  Output -  Net 1312 ml     Physical Exam: Vital Signs Blood pressure 122/65, pulse 66, temperature 97.8 F (36.6 C), temperature source Oral, resp. rate 18, height 5' 5.98" (1.676 m), weight 91.7 kg, SpO2 94 %.   Physical Exam Constitutional: No distress . Vital signs reviewed. HENT: Normocephalic.  Atraumatic. Eyes: EOMI. No discharge. Cardiovascular: No JVD. Respiratory: Normal effort.  No stridor. GI: Non-distended. Skin: Right hip with dressing C/D/I Psych: Normal mood.  Normal behavior. Musc: Right hip edema with tenderness. Neuro: Alert HOH Motor: Bilateral upper extremities: 5/5 proximal distal Left lower extremity: Hip flexion, knee extension 4-- 4/5, ankle dorsiflexion 5/5 Right lower extremity: Hip flexion, knee extension 2/5, ankle dorsiflexion 5/5  Assessment/Plan: 1. Functional deficits secondary to R periprosthetic hip fracture due to ground level fall and poor balance which require 3+ hours per day of interdisciplinary therapy in a comprehensive inpatient rehab setting.  Physiatrist is providing close team supervision and 24 hour management of active medical problems listed below.  Physiatrist and rehab team continue to assess barriers to discharge/monitor patient progress toward functional and medical goals  Care  Tool:  Bathing    Body parts bathed by patient: Right arm, Left arm, Chest, Abdomen, Front perineal area, Right upper leg, Left upper leg, Face   Body parts bathed by helper: Right lower leg, Left lower leg, Buttocks     Bathing assist Assist Level: 2 Helpers     Upper Body Dressing/Undressing Upper body dressing   What is the patient wearing?: Hospital gown only    Upper body assist Assist Level: Set up assist    Lower Body Dressing/Undressing Lower body dressing      What is the patient wearing?: Pants     Lower body assist Assist for lower body dressing: 2 Helpers     Toileting Toileting    Toileting assist Assist for toileting: 2 Helpers     Transfers Chair/bed transfer  Transfers assist     Chair/bed transfer assist level: Moderate Assistance - Patient 50 - 74% Chair/bed transfer assistive device: Programmer, multimedia   Ambulation assist   Ambulation activity did not occur: Safety/medical concerns(increased pain/decreased activity tolerance)          Walk 10 feet activity   Assist  Walk 10 feet activity did not occur: Safety/medical concerns(increased pain/decreased activity tolerance)        Walk 50 feet activity   Assist Walk 50 feet with 2 turns activity did not occur: Safety/medical concerns(increased pain/decreased activity tolerance)         Walk 150 feet activity   Assist Walk 150 feet activity did not occur: Safety/medical concerns(increased pain/decreased activity tolerance)         Walk  10 feet on uneven surface  activity   Assist Walk 10 feet on uneven surfaces activity did not occur: Safety/medical concerns(increased pain/decreased activity tolerance)         Wheelchair     Assist   Type of Wheelchair: Manual    Wheelchair assist level: Supervision/Verbal cueing, Set up assist Max wheelchair distance: 105'    Wheelchair 50 feet with 2 turns activity    Assist    Wheelchair 50  feet with 2 turns activity did not occur: Safety/medical concerns(increased pain/decreased activity tolerance)   Assist Level: Set up assist, Supervision/Verbal cueing   Wheelchair 150 feet activity     Assist  Wheelchair 150 feet activity did not occur: Safety/medical concerns(increased pain/decreased activity tolerance)       Blood pressure 122/65, pulse 66, temperature 97.8 F (36.6 C), temperature source Oral, resp. rate 18, height 5' 5.98" (1.676 m), weight 91.7 kg, SpO2 94 %.    Medical Problem List and Plan: 1. R hip periprosthetic fracture s/p ORIF secondary to ground level fall at home due to poor balance.           Cont CIR 2. Antithrombotics: -DVT/anticoagulation:Pharmaceutical:Coumadin  INR borderline therapeutic on 10/5 -antiplatelet therapy: NA 3. Pain Management:  continue gabapentin tid for neuropathy and Norco prn   Tramadol as needed added on 10/4 4. Mood:LCSW to follow for evaluation and support. -antipsychotic agents: N/A 5. Neuropsych: This patient is capable of making decisions on his own behalf. 6. Skin/Wound Care:Monitor wound for healing. Continue nutritional supplement to promote healing. 7. Fluids/Electrolytes/Nutrition:Monitor I/O.  8. Chronic A FIB: Monitor HR tid--continue coreg. Monitor for orthostatic symptoms.  Continue Coumadin 9. CLL: WBC trending back down- is normally in 20s.   WBCs 32.6 on 10/5  Cont to monitor 10 ABLA: Continue to monitor H/H--stabilizing around 8. On iron supplement.   Hemoglobin 10.2 on 10/5 11. T2DM with hyperglycemia: On lantus 35 units daily with SSI for elevated BS. Monitor BS ac/hs and titrate lantus as indicated.   Decrease Lantus to 36 units on 10/4  Decreased Lantus to 20 units on 10/6 (already received a.m. dose) due to hypoglycemia  Will ensure for bedtime snack CBG (last 3)  Recent Labs    03/26/19 2126 03/27/19 0622 03/27/19 0705  GLUCAP 116* 49* 82    12.Hyponatremia: Resolved 13. Chronic cough (due to chronic aspiration): per records has refused dysphagia diet. On duonebs tid. On Neurontin for cough? Keep HOB>30 degrees  10/2-Duonebs TID since resp d/c'd AND made prn as well, so doesn't miss a dose with therapy. 14. Constipation due to opioids-  Scheduled Senokot S 2 tabs BID and use prns as necessary,  Cont prn meds 15. Poor balance            vestibular PT  16. Elevated K+  K+ 5.1 on 10/5  Labs ordered for tomorrow   LOS: 7 days A FACE TO FACE EVALUATION WAS PERFORMED  Oscar Baker Lorie Phenix 03/27/2019, 11:13 AM

## 2019-03-28 ENCOUNTER — Inpatient Hospital Stay (HOSPITAL_COMMUNITY): Payer: Medicare Other | Admitting: Occupational Therapy

## 2019-03-28 ENCOUNTER — Inpatient Hospital Stay (HOSPITAL_COMMUNITY): Payer: Medicare Other

## 2019-03-28 LAB — BASIC METABOLIC PANEL
Anion gap: 7 (ref 5–15)
BUN: 16 mg/dL (ref 8–23)
CO2: 28 mmol/L (ref 22–32)
Calcium: 8.8 mg/dL — ABNORMAL LOW (ref 8.9–10.3)
Chloride: 106 mmol/L (ref 98–111)
Creatinine, Ser: 0.81 mg/dL (ref 0.61–1.24)
GFR calc Af Amer: >60 mL/min (ref >60)
GFR calc non Af Amer: >60 mL/min (ref >60)
Glucose, Bld: 97 mg/dL (ref 70–99)
Potassium: 4.4 mmol/L (ref 3.5–5.1)
Sodium: 141 mmol/L (ref 135–145)

## 2019-03-28 LAB — GLUCOSE, CAPILLARY
Glucose-Capillary: 76 mg/dL (ref 70–99)
Glucose-Capillary: 103 mg/dL — ABNORMAL HIGH (ref 70–99)
Glucose-Capillary: 175 mg/dL — ABNORMAL HIGH (ref 70–99)
Glucose-Capillary: 206 mg/dL — ABNORMAL HIGH (ref 70–99)

## 2019-03-28 LAB — PROTIME-INR
INR: 2.1 — ABNORMAL HIGH (ref 0.8–1.2)
Prothrombin Time: 23.3 seconds — ABNORMAL HIGH (ref 11.4–15.2)

## 2019-03-28 MED ORDER — WARFARIN SODIUM 5 MG PO TABS
5.0000 mg | ORAL_TABLET | Freq: Once | ORAL | Status: AC
  Administered 2019-03-28: 19:00:00 5 mg via ORAL
  Filled 2019-03-28: qty 1

## 2019-03-28 MED ORDER — MAGNESIUM CITRATE PO SOLN
1.0000 | Freq: Once | ORAL | Status: AC
  Administered 2019-03-28: 15:00:00 1 via ORAL
  Filled 2019-03-28: qty 296

## 2019-03-28 NOTE — Progress Notes (Signed)
ANTICOAGULATION CONSULT NOTE  Pharmacy Consult for warfarin Indication: atrial fibrillation  Patient Measurements: Height: 5' 5.98" (167.6 cm) Weight: 275 lb (124.7 kg) IBW/kg (Calculated) : 63.75  Vital Signs: Temp: 97.7 F (36.5 C) (10/06 0208) BP: 113/54 (10/06 0208) Pulse Rate: 61 (10/06 0208)  Labs: Recent Labs    03/26/19 0615 03/27/19 0612 03/28/19 0456  HGB  --  10.2*  --   HCT  --  31.3*  --   PLT  --  553*  --   LABPROT 25.0* 22.5* 23.3*  INR 2.3* 2.0* 2.1*  CREATININE  --  0.89 0.81    Estimated Creatinine Clearance: 84.7 mL/min (by C-G formula based on SCr of 0.81 mg/dL).   Assessment: Patient on warfarin for atrial fibrillation. PTA Warfarin dosing: 5 mg daily except 7.5 mg on Tues and Sat. Warfarin was resumed on 9/23 s/p THA surgery. No bleeding noted. INR is therapeutic at 2.1.   Goal of Therapy:  INR 2-3 Monitor platelets by anticoagulation protocol: Yes    Plan:  - Warfarin 5 mg po x1 - Daily INR   Harvel Quale 03/28/2019 7:57 AM

## 2019-03-28 NOTE — Progress Notes (Signed)
Occupational Therapy Session Note  Patient Details  Name: Oscar Baker MRN: [id number] Date of Birth: [birth year]  Today's Date: 03/28/2019 OT Individual Time: 1345-1425 OT Individual Time Calculation (min): 40 min    Short Term Goals: Week 2:  OT Short Term Goal 1 (Week 2): Pt will complete toilet transfers with mod A OT Short Term Goal 2 (Week 2): Pt will complete toileting tasks with max A OT Short Term Goal 3 (Week 2): Pt will complete LB dressing tasks with min A using AE PRN OT Short Term Goal 4 (Week 2): Pt will complete LB bathing tasks with min A using AE PRN  Skilled Therapeutic Interventions/Progress Updates:    Pt resting in w/c upon arrival and ready for therapy.  OT intervention with focus on sit<>stand, discharge planning, w/c mobility, and functional amb with RW to increase independence with functional mobility and BADLs. Pt propelled w/c to ADL apartment to practice furniture transfers and discuss seating arrangements at home.  Pt performed stand step pivot tranfser to sofa with min A.  Pt required max A+2 for sit<>stand from sofa.  Discussed optional seating arragnements (also discussed lift chair with wife-pros and cons). Pt amb 67' with RW with CGA and w/c follow. Pt unable to attempt further amb.  Pt returned to room and remained seated in w/c with ice applied to R hip.   Therapy Documentation Precautions:  Precautions Precautions: Fall Restrictions Weight Bearing Restrictions: Yes RLE Weight Bearing: Partial weight bearing RLE Partial Weight Bearing Percentage or Pounds: 50   Pain:  Pt c/o 7/10 pain in R hip with movement; repositioned and ice applied  Therapy/Group: Individual Therapy  [name] 03/28/2019, 2:40 PM

## 2019-03-28 NOTE — Progress Notes (Signed)
Physical Therapy Weekly Progress Note  Patient Details  Name: Oscar Baker MRN: [id number] Date of Birth: 02-24-1933  Beginning of progress report period: March 21, 2019 End of progress report period: March 28, 2019  Today's Date: 03/28/2019 PT Individual Time: 0800-0845 PT Individual Time Calculation (min): 45 min   Patient has met 3 of 3 short term goals.  Patient is progressing well with therapies, sessions sometimes have been limited by urinary incontinence requiring frequent brief changing and peri-care during sessions. Patient currently requires mod A of 1 person for bed mobility, sit to stand, and stand pivot transfers, with +2 assist at end of day with fatigue. He has initiated gait training up to 26 feet with the RW with min A and has improved with maintaining PWB with all mobility. He continues to present with decreased R LE strength/ROM, decreased balance, increased assist with functional mobility, and increased R hip pain/edema and will benefit from continued skilled PT to address these deficits in order for the patient to return home with his wife.   Patient continues to demonstrate the following deficits muscle weakness and muscle joint tightness, decreased cardiorespiratoy endurance and decreased sitting balance, decreased standing balance, decreased postural control, decreased balance strategies and difficulty maintaining precautions and therefore will continue to benefit from skilled PT intervention to increase functional independence with mobility.  Patient progressing toward long term goals..  Continue plan of care.  PT Short Term Goals Week 1:  PT Short Term Goal 1 (Week 1): Patient will perform basic transfer using LRAD consistenty with mod A of 1 person. PT Short Term Goal 1 - Progress (Week 1): Met PT Short Term Goal 2 (Week 1): Patient will perform sit<>supine with mod A. PT Short Term Goal 2 - Progress (Week 1): Met PT Short Term Goal 3 (Week 1): Patient will  propel w/c >50 feet. PT Short Term Goal 3 - Progress (Week 1): Met Week 2:  PT Short Term Goal 1 (Week 2): Patient will perform bed mobility with min A. PT Short Term Goal 2 (Week 2): Patient will perform basic transfers with min A of 1 person. PT Short Term Goal 3 (Week 2): Patient will ambulate >25 feet with CGA using LRAD while maintaining R PWB. PT Short Term Goal 4 (Week 2): Patient will propel w/c 100 ft.  Skilled Therapeutic Interventions/Progress Updates:     Patient in bed with his wife at bedside upon PT arrival. Patient alert and agreeable to PT session. Patient denied pain at beginning of session.   Therapeutic Activity: Bed Mobility: Patient performed supine to sit with min A in a flat bed with use of bed rails. Provided verbal cues for sliding LE off the bed and pushing through his elbows to sit up. Transfers: Patient performed sit to/from stand x2 and stand pivot during a toilet transfer x2 with mod-min A using the RW, mod A from lower surfaces. Provided verbal cues for leaning forward to stand, having his R foot forward when standing/sitting to off-weight R LE, and hand placement for L hand or both hands to push up from the bed/armrest or both hand. Required total A for peri-care and max A for LB dressing during toileting, CGA-min A for balance in standing to pull up a new brief and paper scrubs.   Gait Training:  Patient ambulated 26 feet using RW with min A-CGA. Ambulated with step-to gait pattern leading with R, increased trunk flexion, decreased stance time on R with an antalgic gait, and increased R  knee and hip flexion in stance. Provided verbal cues for sequencing, using of UEs to off-weight R LE in stance to maintain PWB.  Wheelchair Mobility:  Patient propelled wheelchair 20 feet and 35 feet with supervision using B UEs. Provided verbal cues for propelling with both UEs at the same time to steer straight and turning technique.   Patient in w/c with R LE elevated using  ELR and ice packs applied to R hip at end of session with breaks locked, chair alarm set with his wife in the room, and all needs within reach. Patient reported 6/10 R hip pain at end of session, RN made aware. PT provided repositioning, rest breaks, and distraction as pain interventions throughout session.   Therapy Documentation Precautions:  Precautions Precautions: Fall Restrictions Weight Bearing Restrictions: Yes RLE Weight Bearing: Partial weight bearing RLE Partial Weight Bearing Percentage or Pounds: 50   Therapy/Group: Individual Therapy  Maille Halliwell L Marina Desire PT, DPT  03/28/2019, 12:24 PM

## 2019-03-28 NOTE — Progress Notes (Signed)
PHYSICAL MEDICINE & REHABILITATION PROGRESS NOTE   Subjective/Complaints: Patient reports no BM in 2-3 days- ready for the stuff to drink, to go- admits had been refusing all bowel meds in between now and LBM.  Didn't sleep well- only ~ 2 hrs per sleep chart- walked 20 ft with PWB on RLE with RW yesterday- did again today in front of me!   ROS: Denies CP, SOB, N/V/D  Objective:   No results found. Recent Labs    03/27/19 0612  WBC 32.6*  HGB 10.2*  HCT 31.3*  PLT 553*   Recent Labs    03/27/19 0612 03/28/19 0456  NA 137 141  K 5.1 4.4  CL 105 106  CO2 27 28  GLUCOSE 74 97  BUN 13 16  CREATININE 0.89 0.81  CALCIUM 8.9 8.8*    Intake/Output Summary (Last 24 hours) at 03/28/2019 1326 Last data filed at 03/28/2019 0800 Gross per 24 hour  Intake 1464 ml  Output 700 ml  Net 764 ml     Physical Exam: Vital Signs Blood pressure (!) 113/54, pulse 61, temperature 97.7 F (36.5 C), resp. rate 17, height 5' 5.98" (1.676 m), weight 124.7 kg, SpO2 96 %.   Physical Exam Constitutional: No distress . Vital signs and labs reviewed. Sitting up in manual w/c, wife at bedside; laughing more; PT and OT in room, NAD HENT: Normocephalic.  Atraumatic. Eyes: EOMI. No discharge. Cardiovascular: No JVD. Respiratory: Normal effort.  No stridor. GI: Non-distended. Skin: Right hip with dressing C/D/I Psych: Normal mood.  Normal behavior. Musc: Right hip edema with tenderness. Neuro: Alert HOH Motor: Bilateral upper extremities: 5/5 proximal distal Left lower extremity: Hip flexion, knee extension 4-- 4/5, ankle dorsiflexion 5/5 Right lower extremity: Hip flexion, knee extension 2/5, ankle dorsiflexion 5/5  Assessment/Plan: 1. Functional deficits secondary to R periprosthetic hip fracture due to ground level fall and poor balance which require 3+ hours per day of interdisciplinary therapy in a comprehensive inpatient rehab setting.  Physiatrist is providing close  team supervision and 24 hour management of active medical problems listed below.  Physiatrist and rehab team continue to assess barriers to discharge/monitor patient progress toward functional and medical goals  Care Tool:  Bathing    Body parts bathed by patient: Right arm, Left arm, Chest, Abdomen, Front perineal area, Right upper leg, Left upper leg, Face   Body parts bathed by helper: Right lower leg, Left lower leg, Buttocks     Bathing assist Assist Level: 2 Helpers     Upper Body Dressing/Undressing Upper body dressing   What is the patient wearing?: Pull over shirt    Upper body assist Assist Level: Set up assist    Lower Body Dressing/Undressing Lower body dressing      What is the patient wearing?: Pants, Incontinence brief     Lower body assist Assist for lower body dressing: Maximal Assistance - Patient 25 - 49%     Toileting Toileting    Toileting assist Assist for toileting: Total Assistance - Patient < 25%     Transfers Chair/bed transfer  Transfers assist     Chair/bed transfer assist level: Moderate Assistance - Patient 50 - 74% Chair/bed transfer assistive device: Programmer, multimedia   Ambulation assist   Ambulation activity did not occur: Safety/medical concerns(increased pain/decreased activity tolerance)  Assist level: Minimal Assistance - Patient > 75% Assistive device: Walker-rolling Max distance: 26'   Walk 10 feet activity   Assist  Walk 10 feet  activity did not occur: Safety/medical concerns(increased pain/decreased activity tolerance)  Assist level: Minimal Assistance - Patient > 75% Assistive device: Walker-rolling   Walk 50 feet activity   Assist Walk 50 feet with 2 turns activity did not occur: Safety/medical concerns(increased pain/decreased activity tolerance)         Walk 150 feet activity   Assist Walk 150 feet activity did not occur: Safety/medical concerns(increased pain/decreased activity  tolerance)         Walk 10 feet on uneven surface  activity   Assist Walk 10 feet on uneven surfaces activity did not occur: Safety/medical concerns(increased pain/decreased activity tolerance)         Wheelchair     Assist Will patient use wheelchair at discharge?: Yes Type of Wheelchair: Manual    Wheelchair assist level: Supervision/Verbal cueing, Set up assist Max wheelchair distance: 77'    Wheelchair 50 feet with 2 turns activity    Assist    Wheelchair 50 feet with 2 turns activity did not occur: Safety/medical concerns(increased pain/decreased activity tolerance)   Assist Level: Set up assist, Supervision/Verbal cueing   Wheelchair 150 feet activity     Assist  Wheelchair 150 feet activity did not occur: Safety/medical concerns(increased pain/decreased activity tolerance)       Blood pressure (!) 113/54, pulse 61, temperature 97.7 F (36.5 C), resp. rate 17, height 5' 5.98" (1.676 m), weight 124.7 kg, SpO2 96 %.    Medical Problem List and Plan: 1. R hip periprosthetic fracture s/p ORIF secondary to ground level fall at home due to poor balance. PWB on RLE           Cont CIR 2. Antithrombotics: -DVT/anticoagulation:Pharmaceutical:Coumadin  INR borderline therapeutic on 10/5- 2.1 -antiplatelet therapy: NA 3. Pain Management:  continue gabapentin tid for neuropathy and Norco prn   Tramadol as needed added on 10/4 4. Mood:LCSW to follow for evaluation and support. -antipsychotic agents: N/A 5. Neuropsych: This patient is capable of making decisions on his own behalf. 6. Skin/Wound Care:Monitor wound for healing. Continue nutritional supplement to promote healing. 7. Fluids/Electrolytes/Nutrition:Monitor I/O.  8. Chronic A FIB: Monitor HR tid--continue coreg. Monitor for orthostatic symptoms.  Continue Coumadin 9. CLL: WBC trending back down- is normally in 20s.   WBCs 32.6 on 10/5  Cont to monitor 10  ABLA: Continue to monitor H/H--stabilizing around 8. On iron supplement.   Hemoglobin 10.2 on 10/5 11. T2DM with hyperglycemia: On lantus 35 units daily with SSI for elevated BS. Monitor BS ac/hs and titrate lantus as indicated.   Decrease Lantus to 36 units on 10/4  Decreased Lantus to 20 units on 10/6 (already received a.m. dose) due to hypoglycemia  Will ensure for bedtime snack CBG (last 3)  Recent Labs    03/27/19 2102 03/28/19 0620 03/28/19 1154  GLUCAP 195* 76 103*   12.Hyponatremia: Resolved 13. Chronic cough (due to chronic aspiration): per records has refused dysphagia diet. On duonebs tid. On Neurontin for cough? Keep HOB>30 degrees  10/2-Duonebs TID since resp d/c'd AND made prn as well, so doesn't miss a dose with therapy. 14. Constipation due to opioids-  Scheduled Senokot S 2 tabs BID and use prns as necessary,  10/6- give Mg citrate and suppository/enema if no results- advised pt to stop refusing bowel meds when going.  Cont prn meds 15. Poor balance            vestibular PT  16. Elevated K+  K+ 5.1 on 10/5  Labs ordered for tomorrow  10/6- K+  4.4- much improved with no treatment   LOS: 8 days A FACE TO FACE EVALUATION WAS PERFORMED  Anureet Bruington 03/28/2019, 1:26 PM

## 2019-03-28 NOTE — Progress Notes (Addendum)
Occupational Therapy Session Note  Patient Details  Name: KEILAN BADDERS MRN: [id number] Date of Birth: May 14, 1933  Today's Date: 03/28/2019 OT Individual Time: [id number] OT Individual Time Calculation (min): 55 min    Short Term Goals: Week 2:  OT Short Term Goal 1 (Week 2): Pt will complete toilet transfers with mod A OT Short Term Goal 2 (Week 2): Pt will complete toileting tasks with max A OT Short Term Goal 3 (Week 2): Pt will complete LB dressing tasks with min A using AE PRN OT Short Term Goal 4 (Week 2): Pt will complete LB bathing tasks with min A using AE PRN  Skilled Therapeutic Interventions/Progress Updates:    OT intervention with focus on sit<>stand, standing balance, functional amb with RW, and activity tolerance to increase independence with BADLs.  Pt initially engaged in standing activity assembling pipe structure using BUE.  Pt able to remove BUE from RW, maintain balance and PWB to complete structure.  Pt amb 25' with RW (CGA and w/c follow). Pt returned to room and incontinence brief changed with tot A. Pt remained seated in w/c with wife present and all needs within reach.   Therapy Documentation Precautions:  Precautions Precautions: Fall Restrictions Weight Bearing Restrictions: Yes RLE Weight Bearing: Partial weight bearing RLE Partial Weight Bearing Percentage or Pounds: 50 Pain:  Pt 6/10 pain in R hip with transitional movements; ice applied and repositioned   Therapy/Group: Individual Therapy  [name] 03/28/2019, 12:53 PM

## 2019-03-28 NOTE — Progress Notes (Signed)
Occupational Therapy Weekly Progress Note  Patient Details  Name: Oscar Baker MRN: [id number] Date of Birth: [birth year]  Beginning of progress report period: March 21, 2019 End of progress report period: March 28, 2019  Patient has met 4 of 4 short term goals.  Pt is making steady progress with BADLs and functional transfers.  Pt requires mod A for sit<>stand, min A for stand pivot transfers, min A for standing balance, and mod A for bathing and LB dressing tasks with AE. Pt's pain is better managed/under control which has facilitated improved active participation in therapy and continued progress.  Pt's wife has been present for all therapy but will not be able to provide assistance after discharge. Pt continues to be motivated and actively participates in all therapy.  Patient continues to demonstrate the following deficits: muscle weakness and acute pain, decreased cardiorespiratoy endurance and decreased standing balance, decreased balance strategies and difficulty maintaining precautions and therefore will continue to benefit from skilled OT intervention to enhance overall performance with BADL and Reduce care partner burden.  Patient progressing toward long term goals..  Continue plan of care.  OT Short Term Goals Week 1:  OT Short Term Goal 1 (Week 1): Pt will complete sit > stand as needed for LB self-care with mod assist of one caregiver OT Short Term Goal 1 - Progress (Week 1): Met OT Short Term Goal 2 (Week 1): Pt will complete bathing with mod assist of one caregiver OT Short Term Goal 2 - Progress (Week 1): Met OT Short Term Goal 3 (Week 1): Pt will complete LB dressing with mod assist of one caregiver with AE as needed OT Short Term Goal 3 - Progress (Week 1): Met OT Short Term Goal 4 (Week 1): Pt will complete toilet transfer with max assist of one caregiver OT Short Term Goal 4 - Progress (Week 1): Met Week 2:  OT Short Term Goal 1 (Week 2): Pt will complete toilet  transfers with mod A OT Short Term Goal 2 (Week 2): Pt will complete toileting tasks with max A OT Short Term Goal 3 (Week 2): Pt will complete LB dressing tasks with min A using AE PRN OT Short Term Goal 4 (Week 2): Pt will complete LB bathing tasks with min A using AE PRN     [name] 03/28/2019, 6:54 AM

## 2019-03-29 ENCOUNTER — Inpatient Hospital Stay (HOSPITAL_COMMUNITY): Payer: Medicare Other

## 2019-03-29 ENCOUNTER — Inpatient Hospital Stay (HOSPITAL_COMMUNITY): Payer: Medicare Other | Admitting: Occupational Therapy

## 2019-03-29 LAB — GLUCOSE, CAPILLARY
Glucose-Capillary: 72 mg/dL (ref 70–99)
Glucose-Capillary: 97 mg/dL (ref 70–99)
Glucose-Capillary: 169 mg/dL — ABNORMAL HIGH (ref 70–99)
Glucose-Capillary: 258 mg/dL — ABNORMAL HIGH (ref 70–99)

## 2019-03-29 LAB — PROTIME-INR
INR: 2.2 — ABNORMAL HIGH (ref 0.8–1.2)
Prothrombin Time: 24 seconds — ABNORMAL HIGH (ref 11.4–15.2)

## 2019-03-29 MED ORDER — WARFARIN SODIUM 5 MG PO TABS
5.0000 mg | ORAL_TABLET | Freq: Once | ORAL | Status: AC
  Administered 2019-03-29: 5 mg via ORAL
  Filled 2019-03-29: qty 1

## 2019-03-29 NOTE — Patient Care Conference (Signed)
Inpatient RehabilitationTeam Conference and Plan of Care Update Date: 03/29/2019   Time: 10:00 AM    Patient Name: Oscar Baker      Medical Record Number: [id number]  Date of Birth: [birth year] Sex: Male         Room/Bed: 4M08C/4M08C-01 Payor Info: Payor: MEDICARE / Plan: MEDICARE PART A AND B / Product Type: *No Product type* /    Admit Date/Time:  03/20/2019  4:16 PM  Primary Diagnosis:  Periprosthetic fracture around internal prosthetic right hip joint [facility])  Patient Active Problem List   Diagnosis Date Noted  . Serum potassium elevated   . Drug induced constipation   . Hypoglycemia   . Uncontrolled type 2 diabetes mellitus with hyperglycemia ([place])   . Acute blood loss anemia   . Periprosthetic fracture around internal prosthetic right hip joint ([place]) 03/20/2019  . Closed right hip fracture, initial encounter ([place]) 03/10/2019  . Chronic bronchitis with productive mucopurulent cough ([place]) 08/22/2018  . Acute systolic heart failure ([place]) 08/02/2018  . Type 2 diabetes mellitus ([place]) 08/02/2018  . Pain in joint of right shoulder 06/08/2018  . CAD (coronary artery disease) 05/21/2018  . Asthma exacerbation 05/21/2018  . Aspiration pneumonia ([place]) 05/21/2018  . Elevated INR   . Shoulder strain, right, initial encounter   . Acute respiratory failure with hypoxia ([place]) 05/20/2018  . Bilateral impacted cerumen 02/14/2018  . Asymmetric SNHL (sensorineural hearing loss) 02/14/2018  . Encounter for therapeutic drug monitoring 12/20/2017  . S/P thoracotomy 11/29/2017  . Sepsis ([place])   . Hypoxemia   . Pneumonia of left lower lobe due to infectious organism   . Mediastinal lymphadenopathy   . Chronic cough 11/23/2017  . CLL (chronic lymphocytic leukemia) ([place]) 11/23/2017  . Pleural effusion 11/23/2017  . HCAP (healthcare-associated pneumonia) 11/20/2017  . Anemia 11/20/2017  . Mixed conductive and sensorineural hearing loss of right ear with restricted hearing of left ear 09/14/2017   . Abnormal hearing test 09/06/2017  . Cellulitis 09/06/2017  . Lymphadenitis, acute 09/06/2017  . Ischemic cardiomyopathy 09/01/2017  . S/p bilateral revision of total hip arthroplasty 08/11/2017  . History of revision of total replacement of left hip joint 08/11/2017  . Right hip pain   . Acute otitis media   . Chronic systolic CHF (congestive heart failure) ([place])   . Pressure injury of skin 07/20/2017  . Pressure ulcer 07/20/2017  . Malignant otitis externa 07/19/2017  . Fall in home 07/19/2017  . Acute on chronic diastolic heart failure ([place]) 07/19/2017  . S/P left THA, AA 05/12/2016  . History of repair of hip joint 05/12/2016  . Laryngopharyngeal reflux 05/21/2015  . Allergic rhinitis 05/21/2015  . Obesity 12/12/2014  . Upper airway cough syndrome 12/11/2014  . Posterior rhinorrhea 12/11/2014  . Cough variant asthma 07/27/2014  . Wheezing 06/20/2014  . Symptomatic bradycardia - s/p Biotronik (serial number [id number]) 02/08/2014  . Bradycardia 02/08/2014  . Diabetes mellitus type 2, noninsulin dependent ([place])   . Mixed incontinence 03/09/2013  . Edema of foot 03/06/2013  . Traumatic ecchymosis of right foot 03/06/2013  . Contusion of foot 03/06/2013  . Malignant neoplasm of prostate ([place]) 02/15/2013  . Diabetes mellitus without complication ([place]) 0000000  . Bone spur 12/30/2012  . Pain of toe of left foot 12/30/2012  . Hyperlipidemia 07/13/2012  . Hypertensive disorder 04/12/2012  . Sick sinus syndrome ([place]) 08/17/2011  . PAF (paroxysmal atrial fibrillation) ([place]) 09/16/2010    Expected Discharge Date: Expected Discharge Date: 04/11/19  Team Members Present:  Physician leading conference: Dr. Courtney Heys Social Worker Present: Ovidio Kin, LCSW Nurse Present: Rayetta Pigg, RN PT Present: Apolinar Junes, PT OT Present: Willeen Cass, OT;Roanna Epley, COTA SLP Present: Other (comment)(Erin Tamala Julian) [place] Coordinator present : Gunnar Fusi, SLP     Current  Status/Progress Goal Weekly Team Focus  Bowel/Bladder   continent of b/b with urgency; LBM: 10/07  time toliet while awake to reduce incontinence urgency  assist with tolieting needs prn   Swallow/Nutrition/ Hydration             ADL's   LB bathing-max A+1; LB dressing-mod A; toileting-tot A; toilet transfers-mod A; sit<>stand and stanading balance-min A;  Supervision  ADL training, sit<>stand, functional transfers, education, activity tolerance, safety awareness   Mobility   Min A bed mobility and mod A-min A stand pivot and sit<>stand transfers with RW bed>w/c with bed slightly elevated, S w/c ~75', min A-CGA gait 26' with RW  Supervision overall  Balance, strength/ROM, activity tolerance, pain/edema management, functional mobility, gait training, patient/caregiver education   Communication             Safety/Cognition/ Behavioral Observations            Pain   c/o to RLE; prn norco and tramadol  pain <4/10  assess pain QS and prn   Skin   R hip incision with foam, stage II on coccyx (foam and barrier cream)  Improvement of current skin issues; reamin free of new skin breakdown/infection  assess skin QS and prn      *See Care Plan and progress notes for long and short-term goals.     Barriers to Discharge  Current Status/Progress Possible Resolutions Date Resolved   Nursing                  PT  Decreased caregiver support  Patient's wife only avaialable caregiver at d/c and can only provide supervision assist.              OT                  SLP                SW                Discharge Planning/Teaching Needs:  Home with wife who can assist some, but is limited with her own health issues.      Team Discussion:  Making good progress this week in therapies, goals are now reachable. Currently min-mod level of assist striving for supervision level. Bowel issues resolved and feeling better. Wife here daily and observes in therapies.  Revisions to Treatment Plan:  DC  10/20    Medical Summary Current Status: cleaned out in last 24 hours; incision looks good- BGs 70s-150s- doing OK without condom cath- pain better controlled; Weekly Focus/Goal: incontinence  Barriers to Discharge: Behavior;Decreased family/caregiver support;Medication compliance;Home enviroment access/layout;Incontinence;Weight bearing restrictions;Weight  Barriers to Discharge Comments: n/a Possible Resolutions to Barriers: improving pain so now walking 26 ft RW min assist   Continued Need for Acute Rehabilitation Level of Care: The patient requires daily medical management by a physician with specialized training in physical medicine and rehabilitation for the following reasons: Direction of a multidisciplinary physical rehabilitation program to maximize functional independence : Yes Medical management of patient stability for increased activity during participation in an intensive rehabilitation regime.: Yes Analysis of laboratory values and/or radiology reports with any subsequent need for medication adjustment and/or medical intervention. :  Yes   I attest that I was present, lead the team conference, and concur with the assessment and plan of the team. Teleconference held due to COVID 19   Ashley Montminy, Gardiner Rhyme 03/29/2019, 1:41 PM

## 2019-03-29 NOTE — Progress Notes (Signed)
Social Work Patient ID: Oscar Baker, male   DOB: [birth year], 85 y.o.   MRN: [id number] Met with pt and wife to discuss team conference goals supervision level and target discharge date 10/20. Pt has made very good progress this week and now the goals above are reachable. Wife approves and feels it will be manageable to go home. She is here daily and see's him in therapies. Work toward discharge.

## 2019-03-29 NOTE — Progress Notes (Signed)
Occupational Therapy Session Note  Patient Details  Name: Oscar Baker MRN: [id number] Date of Birth: 1933/03/09  Today's Date: 03/29/2019 OT Individual Time: 1345-1425 OT Individual Time Calculation (min): 40 min    Short Term Goals: Week 2:  OT Short Term Goal 1 (Week 2): Pt will complete toilet transfers with mod A OT Short Term Goal 2 (Week 2): Pt will complete toileting tasks with max A OT Short Term Goal 3 (Week 2): Pt will complete LB dressing tasks with min A using AE PRN OT Short Term Goal 4 (Week 2): Pt will complete LB bathing tasks with min A using AE PRN  Skilled Therapeutic Interventions/Progress Updates:    Pt resting in w/c with wife present.  Pt agreeable to therapy.  Initial focus on amb with RW to sink to wash hands. When pt stood he requested use of urinal but was unable to void.  Pt able to maintain balance while holding the urinal-CGA. Pt amb with RW to sink to wash hands.  Focus transitioned to functional amb with RW 46'X2. Pt's wife pleased with pt's progress and encouraged.  Pt pleased with progress.  Pt remained in w/c with belt alarm activated and all needs within reach.  Wife present.   Therapy Documentation Precautions:  Precautions Precautions: Fall Restrictions Weight Bearing Restrictions: Yes RLE Weight Bearing: Partial weight bearing RLE Partial Weight Bearing Percentage or Pounds: 50 Pain: Pt denies pain this afternoon  Therapy/Group: Individual Therapy  [name] 03/29/2019, 2:56 PM

## 2019-03-29 NOTE — Progress Notes (Signed)
Physical Therapy Session Note  Patient Details  Name: Oscar Baker MRN: [id number] Date of Birth: 10/29/1932  Today's Date: 03/29/2019 PT Individual Time: 1035-1120 PT Individual Time Calculation (min): 45 min   Short Term Goals: Week 2:  PT Short Term Goal 1 (Week 2): Patient will perform bed mobility with min A. PT Short Term Goal 2 (Week 2): Patient will perform basic transfers with min A of 1 person. PT Short Term Goal 3 (Week 2): Patient will ambulate >25 feet with CGA using LRAD while maintaining R PWB. PT Short Term Goal 4 (Week 2): Patient will propel w/c 100 ft.  Skilled Therapeutic Interventions/Progress Updates:     Patient in w/c with his wife in the room upon PT arrival. Patient alert and agreeable to PT session. Patient reported 2/10 R hip pain during session, reported he was pre medicated prior to session. PT provided repositioning, rest breaks, and distraction as pain interventions throughout session.   Therapeutic Activity: Transfers: Patient performed sit to/from stand x2 with CGA-close supervision. Provided verbal cues for pushing up with both hands from the arm rests and reaching back to sit. Patient recalled placing his R foot forward when sitting and standing to maintain PWB.  Gait Training:  Patient ambulated 46 feet and 80 feet using RW with CGA with a w/c follow due to decreased activity tolerance. Ambulated with step-to gait pattern leading with his R, decreased stance time on R, mild antalgic gait, decreased B step height, and forward trunk flexion. Patient recalled sequencing using teach back method. Provided verbal cues for erect posture, stepping between his hands to off-weight his R LE in stance, shoulder depression, and increased step height to reduce risk of falls/tripping.  Wheelchair Mobility:  Patient propelled wheelchair 100 feet and 20 feet with supervision. Provided verbal cues for propulsion and turning technique and attending to other people around  him to avoid blocking their path x2.   Patient in w/c with is wife in the room at end of session with breaks locked, w/c alarm set, and all needs within reach. Discussed POC and ELOS with patient and his wife and discussed potential to increase therapy time if patient continues to progress. Patient reported that he is not sleeping at night due to sleeping during the day. Discussed strategies for taking short naps and getting OOB for meals and staying up until later in the evening, RN notified.    Therapy Documentation Precautions:  Precautions Precautions: Fall Restrictions Weight Bearing Restrictions: Yes RLE Weight Bearing: Partial weight bearing RLE Partial Weight Bearing Percentage or Pounds: 50    Therapy/Group: Individual Therapy  Oscar Baker Oscar Baker PT, DPT  03/29/2019, 12:35 PM

## 2019-03-29 NOTE — Plan of Care (Signed)
  Problem: Consults Goal: RH GENERAL PATIENT EDUCATION Description: See Patient Education module for education specifics. Outcome: Progressing Goal: Skin Care Protocol Initiated - if Braden Score 18 or less Description: If consults are not indicated, leave blank or document N/A Outcome: Progressing Goal: Diabetes Guidelines if Diabetic/Glucose > 140 Description: If diabetic or lab glucose is > 140 mg/dl - Initiate Diabetes/Hyperglycemia Guidelines & Document Interventions  Outcome: Progressing   Problem: RH BOWEL ELIMINATION Goal: RH STG MANAGE BOWEL WITH ASSISTANCE Description: STG Manage Bowel with min Assistance. Outcome: Progressing Goal: RH STG MANAGE BOWEL W/MEDICATION W/ASSISTANCE Description: STG Manage Bowel with Medication with min Assistance. Outcome: Progressing   Problem: RH SKIN INTEGRITY Goal: RH STG SKIN FREE OF INFECTION/BREAKDOWN Description: Patients skin will remain free from further infection or breakdown with mod assist. Outcome: Progressing Goal: RH STG MAINTAIN SKIN INTEGRITY WITH ASSISTANCE Description: STG Maintain Skin Integrity With mod Assistance. Outcome: Progressing Goal: RH STG ABLE TO PERFORM INCISION/WOUND CARE W/ASSISTANCE Description: STG Able To Perform Incision/Wound Care With total Assistance from caregiver. Outcome: Progressing   Problem: RH SAFETY Goal: RH STG ADHERE TO SAFETY PRECAUTIONS W/ASSISTANCE/DEVICE Description: STG Adhere to Safety Precautions With supervision Assistance/Device. Outcome: Progressing   Problem: RH PAIN MANAGEMENT Goal: RH STG PAIN MANAGED AT OR BELOW PT'S PAIN GOAL Description: < 4 Outcome: Progressing

## 2019-03-29 NOTE — Progress Notes (Signed)
ANTICOAGULATION CONSULT NOTE  Pharmacy Consult for warfarin Indication: atrial fibrillation  Patient Measurements: Height: 5' 5.98" (167.6 cm) Weight: 275 lb (124.7 kg) IBW/kg (Calculated) : 63.75  Vital Signs: BP: 106/61 (10/07 1439) Pulse Rate: 73 (10/07 1439)  Labs: Recent Labs    03/27/19 0612 03/28/19 0456 03/29/19 1010  HGB 10.2*  --   --   HCT 31.3*  --   --   PLT 553*  --   --   LABPROT 22.5* 23.3* 24.0*  INR 2.0* 2.1* 2.2*  CREATININE 0.89 0.81  --     Estimated Creatinine Clearance: 84.7 mL/min (by C-G formula based on SCr of 0.81 mg/dL).   Assessment: Patient on warfarin for atrial fibrillation. PTA Warfarin dosing: 5 mg daily except 7.5 mg on Tues and Sat. Warfarin was resumed on 9/23 s/p THA surgery.   INR therapeutic today at 2.2  Goal of Therapy:  INR 2-3 Monitor platelets by anticoagulation protocol: Yes    Plan:  Warfarin 5mg  PO x1 tonight Daily INR, s/s bleeding  Bertis Ruddy, PharmD Clinical Pharmacist Please check AMION for all [facility] numbers 03/29/2019 5:50 PM

## 2019-03-29 NOTE — Progress Notes (Signed)
[facility] PHYSICAL MEDICINE & REHABILITATION PROGRESS NOTE   Subjective/Complaints: Patient had multiple BMs yesterday- took 4 hours but went- also had small BM this AM.  Best morning he's had since got here-   ROS: Denies CP, SOB, N/V/D  Objective:   No results found. Recent Labs    03/27/19 0612  WBC 32.6*  HGB 10.2*  HCT 31.3*  PLT 553*   Recent Labs    03/27/19 0612 03/28/19 0456  NA 137 141  K 5.1 4.4  CL 105 106  CO2 27 28  GLUCOSE 74 97  BUN 13 16  CREATININE 0.89 0.81  CALCIUM 8.9 8.8*    Intake/Output Summary (Last 24 hours) at 03/29/2019 0925 Last data filed at 03/29/2019 0735 Gross per 24 hour  Intake 1134 ml  Output 450 ml  Net 684 ml     Physical Exam: Vital Signs Blood pressure 107/72, pulse 77, temperature 97.9 F (36.6 C), temperature source Oral, resp. rate 18, height 5' 5.98" (1.676 m), weight 124.7 kg, SpO2 97 %.   Physical Exam Constitutional: No distress . Vital signs and labs reviewed. Sitting up in manual w/c, ; laughing more, a lot- acting bright and chipper today; PT  in room, NAD HENT: Normocephalic.  Atraumatic. Eyes: EOMI. No discharge. Cardiovascular: No JVD. Respiratory: Normal effort.  No stridor. GI: Non-distended. Skin: Right hip with dressing C/D/I- incision looks great- almost healed- no drainage; no erythema; mild edema Psych: Normal mood.  Normal behavior. Musc: Right hip edema with tenderness. Neuro: Alert HOH Motor: Bilateral upper extremities: 5/5 proximal distal Left lower extremity: Hip flexion, knee extension 4-- 4/5, ankle dorsiflexion 5/5 Right lower extremity: Hip flexion, knee extension 2/5, ankle dorsiflexion 5/5  Assessment/Plan: 1. Functional deficits secondary to R periprosthetic hip fracture due to ground level fall and poor balance which require 3+ hours per day of interdisciplinary therapy in a comprehensive inpatient rehab setting.  Physiatrist is providing close team supervision and 24 hour  management of active medical problems listed below.  Physiatrist and rehab team continue to assess barriers to discharge/monitor patient progress toward functional and medical goals  Care Tool:  Bathing    Body parts bathed by patient: Right arm, Left arm, Chest, Abdomen, Front perineal area, Buttocks, Right upper leg, Left upper leg, Right lower leg, Left lower leg   Body parts bathed by helper: Right lower leg, Left lower leg, Buttocks     Bathing assist Assist Level: Minimal Assistance - Patient > 75%     Upper Body Dressing/Undressing Upper body dressing   What is the patient wearing?: Pull over shirt    Upper body assist Assist Level: Set up assist    Lower Body Dressing/Undressing Lower body dressing      What is the patient wearing?: Pants, Incontinence brief     Lower body assist Assist for lower body dressing: Moderate Assistance - Patient 50 - 74%     Toileting Toileting    Toileting assist Assist for toileting: Total Assistance - Patient < 25%     Transfers Chair/bed transfer  Transfers assist     Chair/bed transfer assist level: Moderate Assistance - Patient 50 - 74% Chair/bed transfer assistive device: Programmer, multimedia   Ambulation assist   Ambulation activity did not occur: Safety/medical concerns(increased pain/decreased activity tolerance)  Assist level: Minimal Assistance - Patient > 75% Assistive device: Walker-rolling Max distance: 26'   Walk 10 feet activity   Assist  Walk 10 feet activity did not occur:  Safety/medical concerns(increased pain/decreased activity tolerance)  Assist level: Minimal Assistance - Patient > 75% Assistive device: Walker-rolling   Walk 50 feet activity   Assist Walk 50 feet with 2 turns activity did not occur: Safety/medical concerns(increased pain/decreased activity tolerance)         Walk 150 feet activity   Assist Walk 150 feet activity did not occur: Safety/medical  concerns(increased pain/decreased activity tolerance)         Walk 10 feet on uneven surface  activity   Assist Walk 10 feet on uneven surfaces activity did not occur: Safety/medical concerns(increased pain/decreased activity tolerance)         Wheelchair     Assist Will patient use wheelchair at discharge?: Yes Type of Wheelchair: Manual    Wheelchair assist level: Supervision/Verbal cueing, Set up assist Max wheelchair distance: 92'    Wheelchair 50 feet with 2 turns activity    Assist    Wheelchair 50 feet with 2 turns activity did not occur: Safety/medical concerns(increased pain/decreased activity tolerance)   Assist Level: Set up assist, Supervision/Verbal cueing   Wheelchair 150 feet activity     Assist  Wheelchair 150 feet activity did not occur: Safety/medical concerns(increased pain/decreased activity tolerance)       Blood pressure 107/72, pulse 77, temperature 97.9 F (36.6 C), temperature source Oral, resp. rate 18, height 5' 5.98" (1.676 m), weight 124.7 kg, SpO2 97 %.    Medical Problem List and Plan: 1. R hip periprosthetic fracture s/p ORIF secondary to ground level fall at home due to poor balance. PWB on RLE           Cont CIR 2. Antithrombotics: -DVT/anticoagulation:Pharmaceutical:Coumadin  INR borderline therapeutic on 10/5- 2.1 -antiplatelet therapy: NA 3. Pain Management:  continue gabapentin tid for neuropathy and Norco prn   Tramadol as needed added on 10/4 4. Mood:LCSW to follow for evaluation and support. -antipsychotic agents: N/A 5. Neuropsych: This patient is capable of making decisions on his own behalf. 6. Skin/Wound Care:Monitor wound for healing. Continue nutritional supplement to promote healing. 7. Fluids/Electrolytes/Nutrition:Monitor I/O.  8. Chronic A FIB: Monitor HR tid--continue coreg. Monitor for orthostatic symptoms.  Continue Coumadin 9. CLL: WBC trending back down-  is normally in 20s.   WBCs 32.6 on 10/5  Cont to monitor 10 ABLA: Continue to monitor H/H--stabilizing around 8. On iron supplement.   Hemoglobin 10.2 on 10/5 11. T2DM with hyperglycemia: On lantus 35 units daily with SSI for elevated BS. Monitor BS ac/hs and titrate lantus as indicated.   Decrease Lantus to 36 units on 10/4  Decreased Lantus to 20 units on 10/6 (already received a.m. dose) due to hypoglycemia  Will ensure for bedtime snack CBG (last 3)  Recent Labs    03/28/19 1654 03/28/19 2118 03/29/19 0607  GLUCAP 175* 206* 72   12.Hyponatremia: Resolved 13. Chronic cough (due to chronic aspiration): per records has refused dysphagia diet. On duonebs tid. On Neurontin for cough? Keep HOB>30 degrees  10/2-Duonebs TID since resp d/c'd AND made prn as well, so doesn't miss a dose with therapy. 14. Constipation due to opioids-  Scheduled Senokot S 2 tabs BID and use prns as necessary,  10/6- give Mg citrate and suppository/enema if no results- advised pt to stop refusing bowel meds when going.  10/7- had good BMs x2+- doing well  Cont prn meds 15. Poor balance            vestibular PT  16. Elevated K+  K+ 5.1 on 10/5  Labs  ordered for tomorrow  10/6- K+ 4.4- much improved with no treatment   LOS: 9 days A FACE TO FACE EVALUATION WAS PERFORMED  Punam Broussard 03/29/2019, 9:25 AM

## 2019-03-29 NOTE — Progress Notes (Signed)
Occupational Therapy Session Note  Patient Details  Name: Oscar Baker MRN: [id number] Date of Birth: 1933/06/04  Today's Date: 03/29/2019 OT Individual Time: 0815-0900 OT Individual Time Calculation (min): 45 min    Short Term Goals: Week 2:  OT Short Term Goal 1 (Week 2): Pt will complete toilet transfers with mod A OT Short Term Goal 2 (Week 2): Pt will complete toileting tasks with max A OT Short Term Goal 3 (Week 2): Pt will complete LB dressing tasks with min A using AE PRN OT Short Term Goal 4 (Week 2): Pt will complete LB bathing tasks with min A using AE PRN  Skilled Therapeutic Interventions/Progress Updates:    OT intervention with focus on BADL retraining including bathing at shower level and dressing with sit<>stand from w/c in room. Pt amb with RW from bathroom to shower with CGA.  Sit<>stand with CGA.  Pt used long handle sponge for bathing lower legs. Pt used reacher to thread pants.  Pt able to pull pants over hips while standing. Pt pleased with progress today.  Pt remained in w/c with all needs within reach and bed alarm activated.   Therapy Documentation Precautions:  Precautions Precautions: Fall Restrictions Weight Bearing Restrictions: Yes RLE Weight Bearing: Partial weight bearing RLE Partial Weight Bearing Percentage Oscar Pounds: 50   Pain: Pt denies pain this morning  Therapy/Group: Individual Therapy  [name] 03/29/2019, 9:11 AM

## 2019-03-30 ENCOUNTER — Inpatient Hospital Stay (HOSPITAL_COMMUNITY): Payer: Medicare Other

## 2019-03-30 LAB — GLUCOSE, CAPILLARY
Glucose-Capillary: 100 mg/dL — ABNORMAL HIGH (ref 70–99)
Glucose-Capillary: 120 mg/dL — ABNORMAL HIGH (ref 70–99)
Glucose-Capillary: 143 mg/dL — ABNORMAL HIGH (ref 70–99)
Glucose-Capillary: 189 mg/dL — ABNORMAL HIGH (ref 70–99)

## 2019-03-30 LAB — PROTIME-INR
INR: 2.5 — ABNORMAL HIGH (ref 0.8–1.2)
Prothrombin Time: 27 seconds — ABNORMAL HIGH (ref 11.4–15.2)

## 2019-03-30 MED ORDER — WARFARIN SODIUM 5 MG PO TABS
5.0000 mg | ORAL_TABLET | Freq: Once | ORAL | Status: AC
  Administered 2019-03-30: 18:00:00 5 mg via ORAL
  Filled 2019-03-30: qty 1

## 2019-03-30 NOTE — Progress Notes (Signed)
Occupational Therapy Session Note  Patient Details  Name: [name] MRN: [id number] Date of Birth: [birth year]  Today's Date: 03/30/2019 OT Individual Time: 1330-1430 OT Individual Time Calculation (min): 60 min    Short Term Goals: Week 2:  OT Short Term Goal 1 (Week 2): Pt will complete toilet transfers with mod A OT Short Term Goal 2 (Week 2): Pt will complete toileting tasks with max A OT Short Term Goal 3 (Week 2): Pt will complete LB dressing tasks with min A using AE PRN OT Short Term Goal 4 (Week 2): Pt will complete LB bathing tasks with min A using AE PRN Week 3:     Skilled Therapeutic Interventions/Progress Updates:    OT intervention with focus on functional amb with RW, standing balance, toileting, and bed mobility.  Pt amb with RW (CGA) to bathroom to change incontinence brief and attempt voiding. Pt maintained standing while holding urinal. Pt required tot A for toileting tasks.  Pt transitioned to ADL apartment to practiced sit<>supine in regular bed. Reviewed hip precautions. Pt required max verbal cues. Pt tot A for sit<>supine in bed. Pt returned to room and remained in w/c with belt alarm activated and all needs within reach.    Therapy Documentation Precautions:  Precautions Precautions: Fall Restrictions Weight Bearing Restrictions: Yes RLE Weight Bearing: Partial weight bearing RLE Partial Weight Bearing Percentage or Pounds: 50   Pain: Pt denies pain (premedicated before therapy); increased pain with movement; repositioned  Therapy/Group: Individual Therapy  [name] 03/30/2019, 2:46 PM

## 2019-03-30 NOTE — Progress Notes (Signed)
Oscar Baker PHYSICAL MEDICINE & REHABILITATION PROGRESS NOTE   Subjective/Complaints: Patient just woke up when I entered room- said didn't even hear me- however says doing well- no complaints- wished wife could come to therapy sessions.   Of note, INR 2.5   ROS: Denies CP, SOB, N/V/D  Objective:   No results found. No results for input(s): WBC, HGB, HCT, PLT in the last 72 hours. Recent Labs    03/28/19 0456  NA 141  K 4.4  CL 106  CO2 28  GLUCOSE 97  BUN 16  CREATININE 0.81  CALCIUM 8.8*    Intake/Output Summary (Last 24 hours) at 03/30/2019 0928 Last data filed at 03/30/2019 0239 Gross per 24 hour  Intake 524 ml  Output 400 ml  Net 124 ml     Physical Exam: Vital Signs Blood pressure 116/60, pulse 63, temperature 97.9 F (36.6 C), temperature source Oral, resp. rate 17, height 5' 5.98" (1.676 m), weight 124.7 kg, SpO2 92 %.   Physical Exam Constitutional: No distress . Vital signs and labs reviewed. Lying in bed; just woke up- now alert, still HOH, appropriate, NAD HENT: Normocephalic.  Atraumatic. Eyes: EOMI. No discharge. Conjugate gaze Cardiovascular: No JVD.irregular- rate controlled Respiratory: Normal effort.  No stridor. CTA B/L after cleared/coughed GI: Non-distended. Soft , NT, (+)BS Skin: Right hip with dressing C/D/I- incision looks great- almost healed- no drainage; no erythema; mild edema Psych: Normal mood.  Normal behavior. Musc: Right hip edema with tenderness. Neuro: Alert HOH Motor: Bilateral upper extremities: 5/5 proximal distal Left lower extremity: Hip flexion, knee extension 4-- 4/5, ankle dorsiflexion 5/5 Right lower extremity: Hip flexion, knee extension 2/5, ankle dorsiflexion 5/5  Assessment/Plan: 1. Functional deficits secondary to R periprosthetic hip fracture due to ground level fall and poor balance which require 3+ hours per day of interdisciplinary therapy in a comprehensive inpatient rehab setting.  Physiatrist is  providing close team supervision and 24 hour management of active medical problems listed below.  Physiatrist and rehab team continue to assess barriers to discharge/monitor patient progress toward functional and medical goals  Care Tool:  Bathing    Body parts bathed by patient: Right arm, Left arm, Chest, Abdomen, Front perineal area, Buttocks, Right upper leg, Left upper leg, Right lower leg, Left lower leg   Body parts bathed by helper: Right lower leg, Left lower leg, Buttocks     Bathing assist Assist Level: Minimal Assistance - Patient > 75%     Upper Body Dressing/Undressing Upper body dressing   What is the patient wearing?: Pull over shirt    Upper body assist Assist Level: Set up assist    Lower Body Dressing/Undressing Lower body dressing      What is the patient wearing?: Pants, Incontinence brief     Lower body assist Assist for lower body dressing: Moderate Assistance - Patient 50 - 74%     Toileting Toileting    Toileting assist Assist for toileting: Total Assistance - Patient < 25%     Transfers Chair/bed transfer  Transfers assist     Chair/bed transfer assist level: Moderate Assistance - Patient 50 - 74% Chair/bed transfer assistive device: Programmer, multimedia   Ambulation assist   Ambulation activity did not occur: Safety/medical concerns(increased pain/decreased activity tolerance)  Assist level: Contact Guard/Touching assist Assistive device: Walker-rolling Max distance: 80'   Walk 10 feet activity   Assist  Walk 10 feet activity did not occur: Safety/medical concerns(increased pain/decreased activity tolerance)  Assist level: Contact  Guard/Touching assist Assistive device: Walker-rolling   Walk 50 feet activity   Assist Walk 50 feet with 2 turns activity did not occur: Safety/medical concerns(increased pain/decreased activity tolerance)  Assist level: Contact Guard/Touching assist Assistive device:  Walker-rolling    Walk 150 feet activity   Assist Walk 150 feet activity did not occur: Safety/medical concerns(increased pain/decreased activity tolerance)         Walk 10 feet on uneven surface  activity   Assist Walk 10 feet on uneven surfaces activity did not occur: Safety/medical concerns(increased pain/decreased activity tolerance)         Wheelchair     Assist Will patient use wheelchair at discharge?: Yes Type of Wheelchair: Manual    Wheelchair assist level: Supervision/Verbal cueing Max wheelchair distance: 100'    Wheelchair 50 feet with 2 turns activity    Assist    Wheelchair 50 feet with 2 turns activity did not occur: Safety/medical concerns(increased pain/decreased activity tolerance)   Assist Level: Supervision/Verbal cueing   Wheelchair 150 feet activity     Assist  Wheelchair 150 feet activity did not occur: Safety/medical concerns(increased pain/decreased activity tolerance)       Blood pressure 116/60, pulse 63, temperature 97.9 F (36.6 C), temperature source Oral, resp. rate 17, height 5' 5.98" (1.676 m), weight 124.7 kg, SpO2 92 %.    Medical Problem List and Plan: 1. R hip periprosthetic fracture s/p ORIF secondary to ground level fall at home due to poor balance. PWB on RLE           Cont CIR 2. Antithrombotics: -DVT/anticoagulation:Pharmaceutical:Coumadin  INR borderline therapeutic on 10/5- 2.1 -antiplatelet therapy: NA 3. Pain Management:  continue gabapentin tid for neuropathy and Norco prn   Tramadol as needed added on 10/4 4. Mood:LCSW to follow for evaluation and support. -antipsychotic agents: N/A 5. Neuropsych: This patient is capable of making decisions on his own behalf. 6. Skin/Wound Care:Monitor wound for healing. Continue nutritional supplement to promote healing. 7. Fluids/Electrolytes/Nutrition:Monitor I/O.  8. Chronic A FIB: Monitor HR tid--continue coreg. Monitor  for orthostatic symptoms.  Continue Coumadin 9. CLL: WBC trending back down- is normally in 20s.   WBCs 32.6 on 10/5  Cont to monitor 10 ABLA: Continue to monitor H/H--stabilizing around 8. On iron supplement.   Hemoglobin 10.2 on 10/5 11. T2DM with hyperglycemia: On lantus 35 units daily with SSI for elevated BS. Monitor BS ac/hs and titrate lantus as indicated.   Decrease Lantus to 36 units on 10/4  Decreased Lantus to 20 units on 10/6 (already received a.m. dose) due to hypoglycemia  Will ensure for bedtime snack CBG (last 3)  Recent Labs    03/29/19 1719 03/29/19 2108 03/30/19 0741  GLUCAP 169* 258* 100*   12.Hyponatremia: Resolved 13. Chronic cough (due to chronic aspiration): per records has refused dysphagia diet. On duonebs tid. On Neurontin for cough? Keep HOB>30 degrees  10/2-Duonebs TID since resp d/c'd AND made prn as well, so doesn't miss a dose with therapy. 14. Constipation due to opioids-  Scheduled Senokot S 2 tabs BID and use prns as necessary,  10/6- give Mg citrate and suppository/enema if no results- advised pt to stop refusing bowel meds when going.  10/7- had good BMs x2+- doing well  Cont prn meds 15. Poor balance            vestibular PT  16. Elevated K+  K+ 5.1 on 10/5  Labs ordered for tomorrow  10/6- K+ 4.4- much improved with no treatment  LOS: 10 days A FACE TO FACE EVALUATION WAS PERFORMED  Oscar Baker 03/30/2019, 9:28 AM

## 2019-03-30 NOTE — Plan of Care (Signed)
  Problem: Consults Goal: RH GENERAL PATIENT EDUCATION Description: See Patient Education module for education specifics. Outcome: Progressing Goal: Skin Care Protocol Initiated - if Braden Score 18 or less Description: If consults are not indicated, leave blank or document N/A Outcome: Progressing Goal: Diabetes Guidelines if Diabetic/Glucose > 140 Description: If diabetic or lab glucose is > 140 mg/dl - Initiate Diabetes/Hyperglycemia Guidelines & Document Interventions  Outcome: Progressing   Problem: RH BOWEL ELIMINATION Goal: RH STG MANAGE BOWEL WITH ASSISTANCE Description: STG Manage Bowel with min Assistance. Outcome: Progressing Goal: RH STG MANAGE BOWEL W/MEDICATION W/ASSISTANCE Description: STG Manage Bowel with Medication with min Assistance. Outcome: Progressing   Problem: RH SKIN INTEGRITY Goal: RH STG SKIN FREE OF INFECTION/BREAKDOWN Description: Patients skin will remain free from further infection or breakdown with mod assist. Outcome: Progressing Goal: RH STG MAINTAIN SKIN INTEGRITY WITH ASSISTANCE Description: STG Maintain Skin Integrity With mod Assistance. Outcome: Progressing Goal: RH STG ABLE TO PERFORM INCISION/WOUND CARE W/ASSISTANCE Description: STG Able To Perform Incision/Wound Care With total Assistance from caregiver. Outcome: Progressing   Problem: RH SAFETY Goal: RH STG ADHERE TO SAFETY PRECAUTIONS W/ASSISTANCE/DEVICE Description: STG Adhere to Safety Precautions With supervision Assistance/Device. Outcome: Progressing   Problem: RH PAIN MANAGEMENT Goal: RH STG PAIN MANAGED AT OR BELOW PT'S PAIN GOAL Description: < 4 Outcome: Progressing

## 2019-03-30 NOTE — Progress Notes (Signed)
Occupational Therapy Session Note  Patient Details  Name: Oscar Baker MRN: [id number] Date of Birth: 28-Aug-1932  Today's Date: 03/30/2019 OT Individual Time: 0815-0900 OT Individual Time Calculation (min): 45 min    Short Term Goals: Week 2:  OT Short Term Goal 1 (Week 2): Pt will complete toilet transfers with mod A OT Short Term Goal 2 (Week 2): Pt will complete toileting tasks with max A OT Short Term Goal 3 (Week 2): Pt will complete LB dressing tasks with min A using AE PRN OT Short Term Goal 4 (Week 2): Pt will complete LB bathing tasks with min A using AE PRN  Skilled Therapeutic Interventions/Progress Updates:    Pt resting in bed upon arrival with wife present. Pt sat EOB using bed rails with supervision and preformed sit>stand from EOB with supervision.  Pt incontinent of bowel with no awareness although pt was aware that he was incontinent of bladder (premorbid incontinence). Brief removed while pt was standing and pt amb with RW (CGA) to bathroom for shower to assist with hygiene.  Pt stood in shower with support of grab bars to facilitate therapist performing hygiene. Pt performed sit<>stand X 4 in shower.  Pt returned to room for dressing.  Pt required assistance with LB dressing without use of AE. Pt performed sit<>stand X 3 for dressing tasks.  Pt remained seated in w/c with all needs within reach, belt alarm activated, and wife present.  Focus on standing balance, sit<>stand, funcitonal amb with RW, and activity tolerance to increase independence with BADLs.   Therapy Documentation Precautions:  Precautions Precautions: Fall Restrictions Weight Bearing Restrictions: Yes RLE Weight Bearing: Partial weight bearing RLE Partial Weight Bearing Percentage or Pounds: 50   Pain: Pt denies pain this morning  Therapy/Group: Individual Therapy  [name] 03/30/2019, 9:40 AM

## 2019-03-30 NOTE — Progress Notes (Signed)
Physical Therapy Session Note  Patient Details  Name: Oscar Baker MRN: [id number] Date of Birth: [birth year]  Today's Date: 03/30/2019 PT Individual Time: 1115-1215 PT Individual Time Calculation (min): 60 min   Short Term Goals: Week 1:  PT Short Term Goal 1 (Week 1): Patient will perform basic transfer using LRAD consistenty with mod A of 1 person. PT Short Term Goal 1 - Progress (Week 1): Met PT Short Term Goal 2 (Week 1): Patient will perform sit<>supine with mod A. PT Short Term Goal 2 - Progress (Week 1): Met PT Short Term Goal 3 (Week 1): Patient will propel w/c >50 feet. PT Short Term Goal 3 - Progress (Week 1): Met Week 2:  PT Short Term Goal 1 (Week 2): Patient will perform bed mobility with min A. PT Short Term Goal 2 (Week 2): Patient will perform basic transfers with min A of 1 person. PT Short Term Goal 3 (Week 2): Patient will ambulate >25 feet with CGA using LRAD while maintaining R PWB. PT Short Term Goal 4 (Week 2): Patient will propel w/c 100 ft. Week 3:     Skilled Therapeutic Interventions/Progress Updates:    Pain  8/10 w/specific movements, treatment to toletrance, rest/repositioning as needed.  Pt initially OOB in wc.  Wife present and requesting therapist work on "getting in and out of the bed" today.   wc propulsion 87f w/bilat UE's as warm up activity. Sit to stand from wc w/min assist, extra time, cues for safety. Gait 67fw/RW PWB RLE and cga, wc following, cues for increased use of UE's to unweight RLE  Bed mobility training in rehab apartment:  Short distance gait w/rw and cga from wc to bed, turn/sit on bed w/max cues for safety/rw positioning/hand positioning. Worked on sit to/from supine repeated x 4 using multiple strategies and requiring from mod to cga depending on set up/device. Pt able to best perform when powderboard placed at lower 1/3 of bed allowing pt to abd/add and flex L hip w/greatly reduced friction/resistance.  Therex:   powderboard hip abd/add AAROM to AROM 2x12 powderboard hip and knee flexion 2x12  Wife educated following session about performance w/mobility, device used, availability of similar device.  Demonstrated technique and assist required when powderboard utilized.  Discussed her concerns and agreed to emphasize this in rx sessions due to her request/concerns/pt needs.    Pt left oob in wc w/wife present and needs in reach.  Therapy Documentation Precautions:  Precautions Precautions: Fall Restrictions Weight Bearing Restrictions: Yes RLE Weight Bearing: Partial weight bearing RLE Partial Weight Bearing Percentage or Pounds: 50    Therapy/Group: Individual Therapy  BaCallie FieldingPTPine Air0/01/2019, 4:07 PM

## 2019-03-30 NOTE — Progress Notes (Signed)
[place] for warfarin Indication: atrial fibrillation  Patient Measurements: Height: 5' 5.98" (167.6 cm) Weight: 275 lb (124.7 kg) IBW/kg (Calculated) : 63.75  Vital Signs: Temp: 97.9 F (36.6 C) (10/08 0336) Temp Source: Oral (10/08 0336) BP: 116/60 (10/08 0336) Pulse Rate: 63 (10/08 0336)  Labs: Recent Labs    03/28/19 0456 03/29/19 1010 03/30/19 0603  LABPROT 23.3* 24.0* 27.0*  INR 2.1* 2.2* 2.5*  CREATININE 0.81  --   --     Estimated Creatinine Clearance: 84.7 mL/min (by C-G formula based on SCr of 0.81 mg/dL).   Assessment: Patient on warfarin for atrial fibrillation. PTA Warfarin dosing: 5 mg daily except 7.5 mg on Tues and Sat. Warfarin was resumed on 9/23 s/p THA surgery. No bleeding noted. INR is therapeutic at 2.5.   Goal of Therapy:  INR 2-3 Monitor platelets by anticoagulation protocol: Yes    Plan:  - Warfarin 5 mg po x1 - Daily INR   Harvel Quale 03/30/2019 7:37 AM

## 2019-03-31 ENCOUNTER — Inpatient Hospital Stay (HOSPITAL_COMMUNITY): Payer: Medicare Other | Admitting: Physical Therapy

## 2019-03-31 ENCOUNTER — Inpatient Hospital Stay (HOSPITAL_COMMUNITY): Payer: Medicare Other | Admitting: Occupational Therapy

## 2019-03-31 DIAGNOSIS — R001 Bradycardia, unspecified: Secondary | ICD-10-CM

## 2019-03-31 LAB — PROTIME-INR
INR: 2.5 — ABNORMAL HIGH (ref 0.8–1.2)
Prothrombin Time: 26.9 seconds — ABNORMAL HIGH (ref 11.4–15.2)

## 2019-03-31 LAB — GLUCOSE, CAPILLARY
Glucose-Capillary: 92 mg/dL (ref 70–99)
Glucose-Capillary: 106 mg/dL — ABNORMAL HIGH (ref 70–99)
Glucose-Capillary: 149 mg/dL — ABNORMAL HIGH (ref 70–99)
Glucose-Capillary: 217 mg/dL — ABNORMAL HIGH (ref 70–99)

## 2019-03-31 MED ORDER — WARFARIN SODIUM 5 MG PO TABS
5.0000 mg | ORAL_TABLET | Freq: Once | ORAL | Status: AC
  Administered 2019-03-31: 5 mg via ORAL
  Filled 2019-03-31: qty 1

## 2019-03-31 MED ORDER — HYDROCODONE-ACETAMINOPHEN 7.5-325 MG PO TABS
1.0000 | ORAL_TABLET | ORAL | Status: DC | PRN
  Administered 2019-03-31 – 2019-04-02 (×2): 1 via ORAL
  Filled 2019-03-31 (×3): qty 1

## 2019-03-31 MED ORDER — ACETAMINOPHEN 325 MG PO TABS
650.0000 mg | ORAL_TABLET | ORAL | Status: DC | PRN
  Administered 2019-03-31 – 2019-04-04 (×3): 650 mg via ORAL
  Filled 2019-03-31 (×2): qty 2

## 2019-03-31 MED ORDER — TRAMADOL HCL 50 MG PO TABS
50.0000 mg | ORAL_TABLET | Freq: Every day | ORAL | Status: DC
  Administered 2019-03-31 – 2019-04-10 (×9): 50 mg via ORAL
  Filled 2019-03-31 (×11): qty 1

## 2019-03-31 NOTE — Progress Notes (Signed)
Occupational Therapy Session Note  Patient Details  Name: Oscar Baker MRN: [id number] Date of Birth: 05-Nov-1932  Today's Date: 03/31/2019 OT Individual Time: [id number] OT Individual Time Calculation (min): 47 min    Short Term Goals: Week 2:  OT Short Term Goal 1 (Week 2): Pt will complete toilet transfers with mod A OT Short Term Goal 2 (Week 2): Pt will complete toileting tasks with max A OT Short Term Goal 3 (Week 2): Pt will complete LB dressing tasks with min A using AE PRN OT Short Term Goal 4 (Week 2): Pt will complete LB bathing tasks with min A using AE PRN  Skilled Therapeutic Interventions/Progress Updates:    Treatment session with focus on self-care retraining with sit > stand, functional transfers, and LB dressing.  Pt received supine in bed agreeable to shower this session. Pt completed bed mobility with CGA, but no physical assistance this session.  CGA for sit > stand and pt ambulated to tub bench in room shower with RW with CGA.  Pt completed bathing with assistance to wash buttocks.  Pt reports need to toilet.  Ambulated to toilet with RW with CGA and required max assist for clothing management and hygiene post BM.  Pt able to don shorts with use of reacher and increased time, did require assistance when pulling shorts over hips.  Therapist applied TEDS and socks due to time constraints.  Pt and wife pleased with progress since this therapist evaluated pt.  Therapy Documentation Precautions:  Precautions Precautions: Fall Restrictions Weight Bearing Restrictions: Yes RLE Weight Bearing: Partial weight bearing RLE Partial Weight Bearing Percentage or Pounds: 50 Pain: Pain Assessment Pain Scale: 0-10 Pain Score: 0-No pain   Therapy/Group: Individual Therapy  Simonne Come 03/31/2019, 12:16 PM

## 2019-03-31 NOTE — Progress Notes (Signed)
[place] for warfarin Indication: atrial fibrillation  Patient Measurements: Height: 5' 5.98" (167.6 cm) Weight: 275 lb (124.7 kg) IBW/kg (Calculated) : 63.75  Vital Signs: Temp: 97.7 F (36.5 C) (10/09 0334) Temp Source: Oral (10/09 0334) BP: 128/66 (10/09 0334) Pulse Rate: 79 (10/09 0334)  Labs: Recent Labs    03/29/19 1010 03/30/19 0603 03/31/19 0549  LABPROT 24.0* 27.0* 26.9*  INR 2.2* 2.5* 2.5*    Estimated Creatinine Clearance: 84.7 mL/min (by C-G formula based on SCr of 0.81 mg/dL).   Assessment: Patient on warfarin for atrial fibrillation. PTA Warfarin dosing: 5 mg daily except 7.5 mg on Tues and Sat. Warfarin was resumed on 9/23 s/p THA surgery. No bleeding noted. INR is therapeutic at 2.5.   Goal of Therapy:  INR 2-3 Monitor platelets by anticoagulation protocol: Yes    Plan:  - Warfarin 5 mg po x1 - Daily INR   Duanne Limerick  03/31/2019 7:29 AM

## 2019-03-31 NOTE — Progress Notes (Signed)
Physical Therapy Session Note  Patient Details  Name: Oscar Baker MRN: [id number] Date of Birth: 05/26/1933  Today's Date: 03/31/2019 PT Individual Time: [id number] PT Individual Time Calculation (min): 48 min   Short Term Goals: Week 2:  PT Short Term Goal 1 (Week 2): Patient will perform bed mobility with min A. PT Short Term Goal 2 (Week 2): Patient will perform basic transfers with min A of 1 person. PT Short Term Goal 3 (Week 2): Patient will ambulate >25 feet with CGA using LRAD while maintaining R PWB. PT Short Term Goal 4 (Week 2): Patient will propel w/c 100 ft.  Skilled Therapeutic Interventions/Progress Updates:    Pt received sitting in w/c with his wife present and pt agreeable to therapy session. Pt's wife expresses concerns regarding her ability to assist pt at home specifically getting in/out of bed - therapist provided education on pt's CLOF, therapy POC with estimated D/C date, and included her during session to initiate family education/training. Pt able to recall R LE PWB precautions. Pt requesting need to urinate and have BM. Ambulated ~8ft x2 to/from bathroom using RW with min assist for balance, cuing for maintaining R LE WBing precautions with pt demonstrating understanding. Standing with RW pt able to perform LB clothing management with CGA/min assist for balance - required total assist for posterior peri-care- pt continent of small BM (black) and bladder.  Transported to/from ADL apartment in w/c. Stand pivot w/c<>EOB x2 using RW with CGA/min assist for steadying/balance and pt demonstrates proper sequencing of task with min cuing. Supine<>sit x2 in ADL apartment bed using leg lifter for R LE management with min assist for B LE management (more assist for lifting R LE in bed compared to getting out) and min cuing for sequencing. Therapist had noted sacral wound earlier in session and provided pt with different w/c cushion for improved pressure relief as pt sits in w/c  majority of day. Educated pt/wife regarding pt's need to perform pressure relief every hour in room for a minimum of 1 minute via having nursing staff come assist with sit<>stand or pt performing w/c push-up while his wife is present. Transported pt back to room and left sitting in w/c with needs in reach and seat belt alarm on.  Therapy Documentation Precautions:  Precautions Precautions: Fall Restrictions Weight Bearing Restrictions: Yes RLE Weight Bearing: Partial weight bearing RLE Partial Weight Bearing Percentage or Pounds: 50  Pain: Reports "a little bit" of pain in R LE but this does not limit pt's participation in therapy session - provided rest breaks as needed throughout session.   Therapy/Group: Individual Therapy  Tawana Scale, PT, DPT 03/31/2019, 1:01 PM

## 2019-03-31 NOTE — Plan of Care (Signed)
  Problem: Consults Goal: RH GENERAL PATIENT EDUCATION Description: See Patient Education module for education specifics. Outcome: Progressing Goal: Skin Care Protocol Initiated - if Braden Score 18 or less Description: If consults are not indicated, leave blank or document N/A Outcome: Progressing Goal: Diabetes Guidelines if Diabetic/Glucose > 140 Description: If diabetic or lab glucose is > 140 mg/dl - Initiate Diabetes/Hyperglycemia Guidelines & Document Interventions  Outcome: Progressing   Problem: RH BOWEL ELIMINATION Goal: RH STG MANAGE BOWEL WITH ASSISTANCE Description: STG Manage Bowel with min Assistance. Outcome: Progressing Goal: RH STG MANAGE BOWEL W/MEDICATION W/ASSISTANCE Description: STG Manage Bowel with Medication with min Assistance. Outcome: Progressing   Problem: RH SKIN INTEGRITY Goal: RH STG SKIN FREE OF INFECTION/BREAKDOWN Description: Patients skin will remain free from further infection or breakdown with mod assist. Outcome: Progressing Goal: RH STG MAINTAIN SKIN INTEGRITY WITH ASSISTANCE Description: STG Maintain Skin Integrity With mod Assistance. Outcome: Progressing Goal: RH STG ABLE TO PERFORM INCISION/WOUND CARE W/ASSISTANCE Description: STG Able To Perform Incision/Wound Care With total Assistance from caregiver. Outcome: Progressing   Problem: RH SAFETY Goal: RH STG ADHERE TO SAFETY PRECAUTIONS W/ASSISTANCE/DEVICE Description: STG Adhere to Safety Precautions With supervision Assistance/Device. Outcome: Progressing   Problem: RH PAIN MANAGEMENT Goal: RH STG PAIN MANAGED AT OR BELOW PT'S PAIN GOAL Description: < 4 Outcome: Progressing

## 2019-03-31 NOTE — Progress Notes (Signed)
Occupational Therapy Session Note  Patient Details  Name: Oscar Baker MRN: [id number] Date of Birth: 1932-09-20  Today's Date: 03/31/2019 OT Individual Time: 1015-1100 OT Individual Time Calculation (min): 45 min    Short Term Goals: Week 1:  OT Short Term Goal 1 (Week 1): Pt will complete sit > stand as needed for LB self-care with mod assist of one caregiver OT Short Term Goal 1 - Progress (Week 1): Met OT Short Term Goal 2 (Week 1): Pt will complete bathing with mod assist of one caregiver OT Short Term Goal 2 - Progress (Week 1): Met OT Short Term Goal 3 (Week 1): Pt will complete LB dressing with mod assist of one caregiver with AE as needed OT Short Term Goal 3 - Progress (Week 1): Met OT Short Term Goal 4 (Week 1): Pt will complete toilet transfer with max assist of one caregiver OT Short Term Goal 4 - Progress (Week 1): Met Week 2:  OT Short Term Goal 1 (Week 2): Pt will complete toilet transfers with mod A OT Short Term Goal 2 (Week 2): Pt will complete toileting tasks with max A OT Short Term Goal 3 (Week 2): Pt will complete LB dressing tasks with min A using AE PRN OT Short Term Goal 4 (Week 2): Pt will complete LB bathing tasks with min A using AE PRN Week 3:     Skilled Therapeutic Interventions/Progress Updates:    1:1 Education with wife and pt on shower transfer to tub shower with tub bench. Practiced in ADL apartment; simulated situation to his wife['s bathroom setup. Trial use of leg lifter to assist both legs - STILL REQUIRED A to lift both LEs over the tub threshold.  Pt discouraged about requiring help and reassured him we would practice again with wife trying to assist. Her biggest concerns are his bed mobility and ability to get into shower. Wife reports he also is getting a lift chair.  Pt able to propel w/c back to room with supervision with extra time from ADL apartment.   Therapy Documentation Precautions:  Precautions Precautions:  Fall Restrictions Weight Bearing Restrictions: Yes RLE Weight Bearing: Partial weight bearing RLE Partial Weight Bearing Percentage or Pounds: 50 Pain: Pain Assessment Pain Scale: 0-10 Pain Score: 0-No pain   Therapy/Group: Individual Therapy  Willeen Cass [facility] 03/31/2019, 11:17 AM

## 2019-03-31 NOTE — Progress Notes (Signed)
[place] PHYSICAL MEDICINE & REHABILITATION PROGRESS NOTE   Subjective/Complaints: Patient reports no pain at rest- gets to 6-8/10 with therapy.  Had continent BM at 3am overnight in bathroom.  Wants Rx for lift chair- I feel it's appropriate and will write Rx.   Of note, INR 2.5  again ROS: Denies CP, SOB, N/V/D  Objective:   No results found. No results for input(s): WBC, HGB, HCT, PLT in the last 72 hours. No results for input(s): NA, K, CL, CO2, GLUCOSE, BUN, CREATININE, CALCIUM in the last 72 hours.  Intake/Output Summary (Last 24 hours) at 03/31/2019 0947 Last data filed at 03/31/2019 0800 Gross per 24 hour  Intake 660 ml  Output 425 ml  Net 235 ml     Physical Exam: Vital Signs Blood pressure 128/66, pulse 79, temperature 97.7 F (36.5 C), temperature source Oral, resp. rate 19, height 5' 5.98" (1.676 m), weight 124.7 kg, SpO2 99 %.   Physical Exam Constitutional: No distress . Vital signs and labs reviewed. Sitting up in manual w/c, wife at bedside-  alert, still HOH, appropriate, NAD HENT: Normocephalic.  Atraumatic. Eyes: EOMI. No discharge. Conjugate gaze Cardiovascular: No JVD.irregular- rate controlled Respiratory: Normal effort.  No stridor. CTA B/L after cleared/coughed GI: Non-distended. Soft , NT, (+)BS Skin: Right hip with dressing C/D/I- incision looks great- almost healed- no drainage; no erythema; mild edema Psych: Normal mood.  Normal behavior. Musc: Right hip edema with tenderness. Neuro: Alert HOH Motor: Bilateral upper extremities: 5/5 proximal distal Left lower extremity: Hip flexion, knee extension 4-- 4/5, ankle dorsiflexion 5/5 Right lower extremity: Hip flexion, knee extension 2/5, ankle dorsiflexion 5/5  Assessment/Plan: 1. Functional deficits secondary to R periprosthetic hip fracture due to ground level fall and poor balance which require 3+ hours per day of interdisciplinary therapy in a comprehensive inpatient rehab  setting.  Physiatrist is providing close team supervision and 24 hour management of active medical problems listed below.  Physiatrist and rehab team continue to assess barriers to discharge/monitor patient progress toward functional and medical goals  Care Tool:  Bathing    Body parts bathed by patient: Right arm, Left arm, Chest, Abdomen, Front perineal area, Buttocks, Right upper leg, Left upper leg, Right lower leg, Left lower leg   Body parts bathed by helper: Right lower leg, Left lower leg, Buttocks     Bathing assist Assist Level: Minimal Assistance - Patient > 75%     Upper Body Dressing/Undressing Upper body dressing   What is the patient wearing?: Pull over shirt    Upper body assist Assist Level: Set up assist    Lower Body Dressing/Undressing Lower body dressing      What is the patient wearing?: Pants, Incontinence brief     Lower body assist Assist for lower body dressing: Moderate Assistance - Patient 50 - 74%     Toileting Toileting    Toileting assist Assist for toileting: Total Assistance - Patient < 25%     Transfers Chair/bed transfer  Transfers assist     Chair/bed transfer assist level: Moderate Assistance - Patient 50 - 74% Chair/bed transfer assistive device: Programmer, multimedia   Ambulation assist   Ambulation activity did not occur: Safety/medical concerns(increased pain/decreased activity tolerance)  Assist level: Contact Guard/Touching assist Assistive device: Walker-rolling Max distance: 80'   Walk 10 feet activity   Assist  Walk 10 feet activity did not occur: Safety/medical concerns(increased pain/decreased activity tolerance)  Assist level: Contact Guard/Touching assist Assistive device: Walker-rolling  Walk 50 feet activity   Assist Walk 50 feet with 2 turns activity did not occur: Safety/medical concerns(increased pain/decreased activity tolerance)  Assist level: Contact Guard/Touching  assist Assistive device: Walker-rolling    Walk 150 feet activity   Assist Walk 150 feet activity did not occur: Safety/medical concerns(increased pain/decreased activity tolerance)         Walk 10 feet on uneven surface  activity   Assist Walk 10 feet on uneven surfaces activity did not occur: Safety/medical concerns(increased pain/decreased activity tolerance)         Wheelchair     Assist Will patient use wheelchair at discharge?: Yes Type of Wheelchair: Manual    Wheelchair assist level: Supervision/Verbal cueing Max wheelchair distance: 75    Wheelchair 50 feet with 2 turns activity    Assist    Wheelchair 50 feet with 2 turns activity did not occur: Safety/medical concerns(increased pain/decreased activity tolerance)   Assist Level: Supervision/Verbal cueing   Wheelchair 150 feet activity     Assist  Wheelchair 150 feet activity did not occur: Safety/medical concerns(increased pain/decreased activity tolerance)       Blood pressure 128/66, pulse 79, temperature 97.7 F (36.5 C), temperature source Oral, resp. rate 19, height 5' 5.98" (1.676 m), weight 124.7 kg, SpO2 99 %.    Medical Problem List and Plan: 1. R hip periprosthetic fracture s/p ORIF secondary to ground level fall at home due to poor balance. PWB on RLE  10/9- wrote Rx for lift chair, -feel it's appropriate.           Cont CIR 2. Antithrombotics: -DVT/anticoagulation:Pharmaceutical:Coumadin  INR borderline therapeutic on 10/5- 2.1  10/9- INR 2.5- therapuetic -antiplatelet therapy: NA 3. Pain Management:  continue gabapentin tid for neuropathy and Norco prn   Tramadol as needed added on 10/4 4. Mood:LCSW to follow for evaluation and support. -antipsychotic agents: N/A 5. Neuropsych: This patient is capable of making decisions on his own behalf. 6. Skin/Wound Care:Monitor wound for healing. Continue nutritional supplement to promote  healing. 7. Fluids/Electrolytes/Nutrition:Monitor I/O.  8. Chronic A FIB: Monitor HR tid--continue coreg. Monitor for orthostatic symptoms.  Continue Coumadin 9. CLL: WBC trending back down- is normally in 20s.   WBCs 32.6 on 10/5  Cont to monitor 10 ABLA: Continue to monitor H/H--stabilizing around 8. On iron supplement.   Hemoglobin 10.2 on 10/5 11. T2DM with hyperglycemia: On lantus 35 units daily with SSI for elevated BS. Monitor BS ac/hs and titrate lantus as indicated.   Decrease Lantus to 36 units on 10/4  Decreased Lantus to 20 units on 10/6 (already received a.m. dose) due to hypoglycemia  Will ensure for bedtime snack CBG (last 3)  Recent Labs    03/30/19 1649 03/30/19 2135 03/31/19 0627  GLUCAP 143* 189* 92   12.Hyponatremia: Resolved 13. Chronic cough (due to chronic aspiration): per records has refused dysphagia diet. On duonebs tid. On Neurontin for cough? Keep HOB>30 degrees  10/2-Duonebs TID since resp d/c'd AND made prn as well, so doesn't miss a dose with therapy. 14. Constipation due to opioids-  Scheduled Senokot S 2 tabs BID and use prns as necessary,  10/6- give Mg citrate and suppository/enema if no results- advised pt to stop refusing bowel meds when going.  10/7- had good BMs x2+- doing well  Cont prn meds 15. Poor balance            vestibular PT  16. Elevated K+  K+ 5.1 on 10/5  Labs ordered for tomorrow  10/6- K+  4.4- much improved with no treatment   LOS: 11 days A FACE TO FACE EVALUATION WAS PERFORMED  Oscar Baker 03/31/2019, 9:47 AM

## 2019-04-01 ENCOUNTER — Inpatient Hospital Stay (HOSPITAL_COMMUNITY): Payer: Medicare Other | Admitting: Occupational Therapy

## 2019-04-01 ENCOUNTER — Inpatient Hospital Stay (HOSPITAL_COMMUNITY): Payer: Medicare Other

## 2019-04-01 LAB — GLUCOSE, CAPILLARY
Glucose-Capillary: 96 mg/dL (ref 70–99)
Glucose-Capillary: 44 mg/dL — CL (ref 70–99)
Glucose-Capillary: 64 mg/dL — ABNORMAL LOW (ref 70–99)
Glucose-Capillary: 96 mg/dL (ref 70–99)
Glucose-Capillary: 175 mg/dL — ABNORMAL HIGH (ref 70–99)
Glucose-Capillary: 158 mg/dL — ABNORMAL HIGH (ref 70–99)

## 2019-04-01 LAB — PROTIME-INR
INR: 2.6 — ABNORMAL HIGH (ref 0.8–1.2)
Prothrombin Time: 27.2 seconds — ABNORMAL HIGH (ref 11.4–15.2)

## 2019-04-01 MED ORDER — WARFARIN SODIUM 5 MG PO TABS
5.0000 mg | ORAL_TABLET | Freq: Every day | ORAL | Status: DC
  Administered 2019-04-01 – 2019-04-10 (×10): 5 mg via ORAL
  Filled 2019-04-01 (×10): qty 1

## 2019-04-01 NOTE — Progress Notes (Signed)
Occupational Therapy Session Note  Patient Details  Name: Oscar Baker MRN: [id number] Date of Birth: 09-02-1932  Today's Date: 04/01/2019 OT Individual Time: [id number] OT Individual Time Calculation (min): 68 min    Short Term Goals: Week 2:  OT Short Term Goal 1 (Week 2): Pt will complete toilet transfers with mod A OT Short Term Goal 2 (Week 2): Pt will complete toileting tasks with max A OT Short Term Goal 3 (Week 2): Pt will complete LB dressing tasks with min A using AE PRN OT Short Term Goal 4 (Week 2): Pt will complete LB bathing tasks with min A using AE PRN  Skilled Therapeutic Interventions/Progress Updates:    Treatment session with focus on functional mobility and LB dressing.  Pt received supine in bed agreeable to therapy session.  Engaged in bed mobility to Lt side of bed (home setup) with close supervision and CGA for sit > stand from EOB with RW.  Engaged in LB dressing with use of reacher when threading BLE.  Pt able to thread BLE with reacher and then stand with close supervision with RW to pull shorts over hips.  Provided pt and wife with plastic foot covering to decrease burden of care when donning TEDS.  Therapist donned one stocking providing cues and demonstration for technique; wife able to complete other with increased time and min cues. Pt donned slip on shoes with use of reacher and increased time. Pt ambulated to toilet with RW with CGA.  Pt able to doff shorts and incontinence brief with close supervision.  Therapist assisted with hygiene post BM.  Pt able to pull shorts over hips with close supervision in standing. Discussed various options for routine with LB dressing to decrease burden of care at home.  Therapy Documentation Precautions:  Precautions Precautions: Fall Restrictions Weight Bearing Restrictions: Yes RLE Weight Bearing: Partial weight bearing RLE Partial Weight Bearing Percentage or Pounds: 50 General:   Vital Signs: Therapy Vitals Temp:  98.7 F (37.1 C) Temp Source: Oral Pulse Rate: 72 Resp: 17 BP: (!) 99/51 Patient Position (if appropriate): Sitting Oxygen Therapy SpO2: 95 % O2 Device: Room Air Pain: Pt with no c/o pain  Therapy/Group: Individual Therapy  Simonne Come 04/01/2019, 3:42 PM

## 2019-04-01 NOTE — Progress Notes (Signed)
Physical Therapy Session Note  Patient Details  Name: Oscar Baker MRN: [id number] Date of Birth: [birth year]  Today's Date: 04/01/2019 PT Individual Time: 1104-1200 PT Individual Time Calculation (min): 56 min   Short Term Goals: Week 1:  PT Short Term Goal 1 (Week 1): Patient will perform basic transfer using LRAD consistenty with mod A of 1 person. PT Short Term Goal 1 - Progress (Week 1): Met PT Short Term Goal 2 (Week 1): Patient will perform sit<>supine with mod A. PT Short Term Goal 2 - Progress (Week 1): Met PT Short Term Goal 3 (Week 1): Patient will propel w/c >50 feet. PT Short Term Goal 3 - Progress (Week 1): Met Week 2:  PT Short Term Goal 1 (Week 2): Patient will perform bed mobility with min A. PT Short Term Goal 2 (Week 2): Patient will perform basic transfers with min A of 1 person. PT Short Term Goal 3 (Week 2): Patient will ambulate >25 feet with CGA using LRAD while maintaining R PWB. PT Short Term Goal 4 (Week 2): Patient will propel w/c 100 ft. Week 3:     Skilled Therapeutic Interventions/Progress Updates:    PAIN: R hip and knee 4-5/10  Rest and repositioning performed during session, treatment to tolerance.  pt initially OOB in chair.  wc propulsion 192f w/bilat UE's and occasional use of wall rail to assist despite therapist instruct to ues UEs only.  wc to mat via STP w/rw and cga.  In sitting performed : LAq RLE x 12 w/2 sec hold at end range STS w/rw x 8reps for LE strengthening/improve performance w/transfers  Sit to supine w/min assist and max verbal cues for sequencing, movement of RLE In supine performed: Hip abd/add AROM to AAROM 3x6 Heel slides x 20 Clamshells (hip abd/er, add/ir) 2x15 Bridge w/L foot bias to decrease wbing on RLE 2 x15 Pt demonstrated improved AROM with all therex and decreased c/o pain   Supine to sit on edge of mat w/supervision only. sts TO RW w/cga. Gait 65fw/RW and cga, w/fatigue cadence siginificantly declined and  pt unable to clear RLE fully thru swing.  Stand to sit in wc w/cga. Pt returned to room and he was able to verbalize activities performed in session to wife.  Explained continued progress w/RLE AROM and w/mobility on mat.   Pt left OOB in wc, char alarm set, wife w/pt, and needs in reach.    Therapy Documentation Precautions:  Precautions Precautions: Fall Restrictions Weight Bearing Restrictions: Yes RLE Weight Bearing: Partial weight bearing RLE Partial Weight Bearing Percentage or Pounds: 50    Therapy/Group: Individual Therapy  BaCallie FieldingPT  04/01/2019, 12:25 PM

## 2019-04-01 NOTE — Progress Notes (Signed)
[place] for warfarin Indication: atrial fibrillation  Patient Measurements: Height: 5' 5.98" (167.6 cm) Weight: 272 lb 15.6 oz (123.8 kg) IBW/kg (Calculated) : 63.75  Vital Signs: Temp: 97.8 F (36.6 C) (10/10 0542) Temp Source: Oral (10/10 0542) BP: 106/74 (10/10 0619) Pulse Rate: 60 (10/10 0542)  Labs: Recent Labs    03/30/19 0603 03/31/19 0549 04/01/19 0529  LABPROT 27.0* 26.9* 27.2*  INR 2.5* 2.5* 2.6*    Estimated Creatinine Clearance: 84.3 mL/min (by C-G formula based on SCr of 0.81 mg/dL).   Assessment: 23 YOM on warfarin for atrial fibrillation. Warfarin resumed on 9/23 s/p THA surgery. INR has been therapeutic and relatively stable.  PO intake has been stable per charting.  No bleeding reported.  Home regimen: 5mg  PO daily except 7.5mg  on Tues and Sat.    Goal of Therapy:  INR 2-3 Monitor platelets by anticoagulation protocol: Yes   Plan:  Try Coumadin 5mg  PO daily at 1800 Change INR to MWF only  Mada Sadik D. Mina Marble, PharmD, BCPS, [place] 04/01/2019, 11:22 AM

## 2019-04-01 NOTE — Progress Notes (Signed)
[facility] PHYSICAL MEDICINE & REHABILITATION PROGRESS NOTE   Subjective/Complaints: Slept ok Has had urinary incont since bladder CA recurrence ROS: Denies CP, SOB, N/V/D  Objective:   No results found. No results for input(s): WBC, HGB, HCT, PLT in the last 72 hours. No results for input(s): NA, K, CL, CO2, GLUCOSE, BUN, CREATININE, CALCIUM in the last 72 hours.  Intake/Output Summary (Last 24 hours) at 04/01/2019 0727 Last data filed at 04/01/2019 0705 Gross per 24 hour  Intake 720 ml  Output 500 ml  Net 220 ml     Physical Exam: Vital Signs Blood pressure 106/74, pulse 60, temperature 97.8 F (36.6 C), temperature source Oral, resp. rate 19, height 5' 5.98" (1.676 m), weight 123.8 kg, SpO2 97 %.      Physical Exam Constitutional: No distress . Vital signs and labs reviewed. Sitting up in manual w/c, wife at bedside-  alert, still HOH, appropriate, NAD HENT: Normocephalic.  Atraumatic. Eyes: EOMI. No discharge. Conjugate gaze Cardiovascular: No JVD.irregular- rate controlled Respiratory: Normal effort.  No stridor. CTA B/L after cleared/coughed GI: Non-distended. Soft , NT, (+)BS Skin: Right hip with dressing C/D/I- incision looks great- almost healed- no drainage; no erythema; mild edema Psych: Normal mood.  Normal behavior. Musc: Right hip edema with tenderness. Neuro: Alert HOH Motor: Bilateral upper extremities: 5/5 proximal distal Left lower extremity: Hip flexion, knee extension 4-- 4/5, ankle dorsiflexion 5/5 Right lower extremity: Hip flexion, knee extension 2/5, ankle dorsiflexion 5/5  Assessment/Plan: 1. Functional deficits secondary to R periprosthetic hip fracture due to ground level fall and poor balance which require 3+ hours per day of interdisciplinary therapy in a comprehensive inpatient rehab setting.  Physiatrist is providing close team supervision and 24 hour management of active medical problems listed below.  Physiatrist and rehab team  continue to assess barriers to discharge/monitor patient progress toward functional and medical goals  Care Tool:  Bathing    Body parts bathed by patient: Right arm, Left arm, Chest, Abdomen, Front perineal area, Right upper leg, Left upper leg, Right lower leg, Left lower leg   Body parts bathed by helper: Buttocks     Bathing assist Assist Level: Minimal Assistance - Patient > 75%     Upper Body Dressing/Undressing Upper body dressing   What is the patient wearing?: Pull over shirt    Upper body assist Assist Level: Set up assist    Lower Body Dressing/Undressing Lower body dressing      What is the patient wearing?: Pants, Incontinence brief     Lower body assist Assist for lower body dressing: Moderate Assistance - Patient 50 - 74%     Toileting Toileting    Toileting assist Assist for toileting: Maximal Assistance - Patient 25 - 49%     Transfers Chair/bed transfer  Transfers assist     Chair/bed transfer assist level: Contact Guard/Touching assist Chair/bed transfer assistive device: Programmer, multimedia   Ambulation assist   Ambulation activity did not occur: Safety/medical concerns(increased pain/decreased activity tolerance)  Assist level: Contact Guard/Touching assist Assistive device: Walker-rolling Max distance: 14ft   Walk 10 feet activity   Assist  Walk 10 feet activity did not occur: Safety/medical concerns(increased pain/decreased activity tolerance)  Assist level: Contact Guard/Touching assist Assistive device: Walker-rolling   Walk 50 feet activity   Assist Walk 50 feet with 2 turns activity did not occur: Safety/medical concerns(increased pain/decreased activity tolerance)  Assist level: Contact Guard/Touching assist Assistive device: Walker-rolling    Walk 150 feet activity  Assist Walk 150 feet activity did not occur: Safety/medical concerns(increased pain/decreased activity tolerance)         Walk 10  feet on uneven surface  activity   Assist Walk 10 feet on uneven surfaces activity did not occur: Safety/medical concerns(increased pain/decreased activity tolerance)         Wheelchair     Assist Will patient use wheelchair at discharge?: Yes Type of Wheelchair: Manual    Wheelchair assist level: Supervision/Verbal cueing Max wheelchair distance: 75    Wheelchair 50 feet with 2 turns activity    Assist    Wheelchair 50 feet with 2 turns activity did not occur: Safety/medical concerns(increased pain/decreased activity tolerance)   Assist Level: Supervision/Verbal cueing   Wheelchair 150 feet activity     Assist  Wheelchair 150 feet activity did not occur: Safety/medical concerns(increased pain/decreased activity tolerance)       Blood pressure 106/74, pulse 60, temperature 97.8 F (36.6 C), temperature source Oral, resp. rate 19, height 5' 5.98" (1.676 m), weight 123.8 kg, SpO2 97 %.    Medical Problem List and Plan: 1. R hip periprosthetic fracture s/p ORIF secondary to ground level fall at home due to poor balance. PWB on RLE  10/9- wrote Rx for lift chair, -feel it's appropriate.  CIR PT, OT  2. Antithrombotics: -DVT/anticoagulation:Pharmaceutical:Coumadin  INR borderline therapeutic on 10/5- 2.1  10/9- INR 2.5- therapuetic -antiplatelet therapy: NA 3. Pain Management:  continue gabapentin tid for neuropathy and Norco prn   Tramadol as needed added on 10/4 4. Mood:LCSW to follow for evaluation and support. -antipsychotic agents: N/A 5. Neuropsych: This patient is capable of making decisions on his own behalf. 6. Skin/Wound Care:Monitor wound for healing. Continue nutritional supplement to promote healing. 7. Fluids/Electrolytes/Nutrition:Monitor I/O.  8. Chronic A FIB: Monitor HR tid--continue coreg. Monitor for orthostatic symptoms.  Continue Coumadin 9. CLL: WBC trending back down- is normally in 20s.   WBCs  32.6 on 10/5  Cont to monitor 10 ABLA: Continue to monitor H/H--stabilizing around 8. On iron supplement.   Hemoglobin 10.2 on 10/5 11. T2DM with hyperglycemia: On lantus 35 units daily with SSI for elevated BS. Monitor BS ac/hs and titrate lantus as indicated.   Decrease Lantus to 36 units on 10/4  Decreased Lantus to 20 units on 10/6 (already received a.m. dose) due to hypoglycemia  Will ensure for bedtime snack CBG (last 3)  Recent Labs    03/31/19 1702 03/31/19 2113 04/01/19 0609  GLUCAP 149* 217* 96   12.Hyponatremia: Resolved 13. Chronic cough (due to chronic aspiration): per records has refused dysphagia diet. On duonebs tid. On Neurontin for cough? Keep HOB>30 degrees  10/2-Duonebs TID since resp d/c'd AND made prn as well, so doesn't miss a dose with therapy. Will enc flutter valve use 14. Constipation due to opioids-  Scheduled Senokot S 2 tabs BID and use prns as necessary,  10/6- give Mg citrate and suppository/enema if no results- advised pt to stop refusing bowel meds when going.  10/7- had good BMs x2+- doing well  Cont prn meds 15. Poor balance            vestibular PT  16. Elevated K+  K+ 5.1 on 10/5  Labs ordered for tomorrow  10/6- K+ 4.4- much improved with no treatment  17.  Chronic incont related to bladder CA - f/u with urology as OP  LOS: 12 days A FACE TO [place] 04/01/2019, 7:27 AM

## 2019-04-01 NOTE — Progress Notes (Signed)
Patient's CBG was 44 at 12:05pm, reported by NT Crystal. Patient was given grape juice, peanut butter, and crackers; CBG was 64 at 12:20pm. Patient ate his lunch at 12:30pm, and his CBG was rechecked at 12:56pm and was 96. Patient remained asymptomatic from 12:05pm until his blood sugar reached 96, only reporting feeling "a little tired". Wife was at the bedside.

## 2019-04-02 ENCOUNTER — Inpatient Hospital Stay (HOSPITAL_COMMUNITY): Payer: Medicare Other

## 2019-04-02 LAB — GLUCOSE, CAPILLARY
Glucose-Capillary: 59 mg/dL — ABNORMAL LOW (ref 70–99)
Glucose-Capillary: 62 mg/dL — ABNORMAL LOW (ref 70–99)
Glucose-Capillary: 69 mg/dL — ABNORMAL LOW (ref 70–99)
Glucose-Capillary: 74 mg/dL (ref 70–99)
Glucose-Capillary: 61 mg/dL — ABNORMAL LOW (ref 70–99)
Glucose-Capillary: 64 mg/dL — ABNORMAL LOW (ref 70–99)
Glucose-Capillary: 125 mg/dL — ABNORMAL HIGH (ref 70–99)
Glucose-Capillary: 133 mg/dL — ABNORMAL HIGH (ref 70–99)
Glucose-Capillary: 147 mg/dL — ABNORMAL HIGH (ref 70–99)

## 2019-04-02 MED ORDER — INSULIN GLARGINE 100 UNIT/ML ~~LOC~~ SOLN
20.0000 [IU] | Freq: Every day | SUBCUTANEOUS | Status: DC
  Filled 2019-04-02: qty 0.2

## 2019-04-02 MED ORDER — INSULIN GLARGINE 100 UNIT/ML ~~LOC~~ SOLN
17.0000 [IU] | Freq: Every day | SUBCUTANEOUS | Status: DC
  Administered 2019-04-02 – 2019-04-07 (×6): 17 [IU] via SUBCUTANEOUS
  Filled 2019-04-02 (×6): qty 0.17

## 2019-04-02 NOTE — Progress Notes (Signed)
Hypoglycemic Event  CBG: 62  Treatment: Graham crackers with peanut butter, followed by breakfast with orange juice  Symptoms: Asymptomatic  Follow-up CBG: Time: 0705 CBG Result: 74  Possible Reasons for Event:   Comments/MD notified: Lorelei Pont

## 2019-04-02 NOTE — Progress Notes (Signed)
Occupational Therapy Session Note  Patient Details  Name: Oscar Baker MRN: [id number] Date of Birth: 05-09-1933  Today's Date: 04/02/2019 OT Individual Time: 1100-1200 OT Individual Time Calculation (min): 60 min    Short Term Goals: Week 2:  OT Short Term Goal 1 (Week 2): Pt will complete toilet transfers with mod A OT Short Term Goal 2 (Week 2): Pt will complete toileting tasks with max A OT Short Term Goal 3 (Week 2): Pt will complete LB dressing tasks with min A using AE PRN OT Short Term Goal 4 (Week 2): Pt will complete LB bathing tasks with min A using AE PRN  Skilled Therapeutic Interventions/Progress Updates:    Pt received sitting up in w/c with his wife present. Permitted wife to come observe therapy session in ADL apt d/t her concerns with tub and bed transfers. Both were edu on use of TTB and leg lifter. Pt reported not liking to use leg lifted and a looped gait belt was trialed instead. Pt completed 1 tub transfer with no additional equipment, requiring min A to lift R leg over side of tub. Then, with gait belt use pt able to lift his own leg over threshold with min cueing. He practiced this 2x more in similar fashion. Pt then used RW to complete functional mobility to the regular, full sized bed. Pt transferred in and out of bed 3x with min A overall and cueing/edu provided throughout on strategies to increase ease at home. Discussed with pt and wife d/c planning and home transfers/safety throughout session. Pt returned to room and left sitting up with all needs met.   Therapy Documentation Precautions:  Precautions Precautions: Fall Restrictions Weight Bearing Restrictions: Yes RLE Weight Bearing: Partial weight bearing RLE Partial Weight Bearing Percentage or Pounds: 50   Therapy/Group: Individual Therapy  Curtis Sites 04/02/2019, 7:23 AM

## 2019-04-02 NOTE — Progress Notes (Signed)
Hypoglycemic Event  CBG:65  Treatment: 4 oz juice/soda, peanut butter crackers   Symptoms: None  Follow-up CBG: Time:*11300  CBG Result: 125   Possible Reasons for Event: Unknown  Comments/MD notified: Erskin Burnet

## 2019-04-02 NOTE — Progress Notes (Signed)
[facility] PHYSICAL MEDICINE & REHABILITATION PROGRESS NOTE   Subjective/Complaints: No issues overnite  Per RN had hypoglycemia  ROS: Denies CP, SOB, N/V/D  Objective:   No results found. No results for input(s): WBC, HGB, HCT, PLT in the last 72 hours. No results for input(s): NA, K, CL, CO2, GLUCOSE, BUN, CREATININE, CALCIUM in the last 72 hours.  Intake/Output Summary (Last 24 hours) at 04/02/2019 0710 Last data filed at 04/02/2019 0125 Gross per 24 hour  Intake 480 ml  Output 400 ml  Net 80 ml     Physical Exam: Vital Signs Blood pressure (!) 116/56, pulse 63, temperature 97.8 F (36.6 C), temperature source Oral, resp. rate 19, height 5' 5.98" (1.676 m), weight 123.8 kg, SpO2 95 %.      Physical Exam Constitutional: No distress . Vital signs and labs reviewed. Sitting up in manual w/c, wife at bedside-  alert, still HOH, appropriate, NAD HENT: Normocephalic.  Atraumatic. Eyes: EOMI. No discharge. Conjugate gaze Cardiovascular: No JVD.irregular- rate controlled Respiratory: Normal effort.  No stridor. CTA B/L after cleared/coughed GI: Non-distended. Soft , NT, (+)BS Skin: Right hip with dressing C/D/I- incision looks great- almost healed- no drainage; no erythema; mild edema Psych: Normal mood.  Normal behavior. Musc: Right hip edema with tenderness. Neuro: Alert HOH Motor: Bilateral upper extremities: 5/5 proximal distal Left lower extremity: Hip flexion, knee extension 4-- 4/5, ankle dorsiflexion 5/5 Right lower extremity: Hip flexion, knee extension 2/5, ankle dorsiflexion 5/5  Assessment/Plan: 1. Functional deficits secondary to R periprosthetic hip fracture due to ground level fall and poor balance which require 3+ hours per day of interdisciplinary therapy in a comprehensive inpatient rehab setting.  Physiatrist is providing close team supervision and 24 hour management of active medical problems listed below.  Physiatrist and rehab team continue to  assess barriers to discharge/monitor patient progress toward functional and medical goals  Care Tool:  Bathing    Body parts bathed by patient: Right arm, Left arm, Chest, Abdomen, Front perineal area, Right upper leg, Left upper leg, Right lower leg, Left lower leg   Body parts bathed by helper: Buttocks     Bathing assist Assist Level: Minimal Assistance - Patient > 75%     Upper Body Dressing/Undressing Upper body dressing   What is the patient wearing?: Pull over shirt    Upper body assist Assist Level: Set up assist    Lower Body Dressing/Undressing Lower body dressing      What is the patient wearing?: Pants     Lower body assist Assist for lower body dressing: Supervision/Verbal cueing(with reacher)     Toileting Toileting    Toileting assist Assist for toileting: Moderate Assistance - Patient 50 - 74%     Transfers Chair/bed transfer  Transfers assist     Chair/bed transfer assist level: Contact Guard/Touching assist Chair/bed transfer assistive device: Programmer, multimedia   Ambulation assist   Ambulation activity did not occur: Safety/medical concerns(increased pain/decreased activity tolerance)  Assist level: Contact Guard/Touching assist Assistive device: Walker-rolling Max distance: 65   Walk 10 feet activity   Assist  Walk 10 feet activity did not occur: Safety/medical concerns(increased pain/decreased activity tolerance)  Assist level: Contact Guard/Touching assist Assistive device: Walker-rolling   Walk 50 feet activity   Assist Walk 50 feet with 2 turns activity did not occur: Safety/medical concerns(increased pain/decreased activity tolerance)  Assist level: Contact Guard/Touching assist Assistive device: Walker-rolling    Walk 150 feet activity   Assist Walk 150 feet  activity did not occur: Safety/medical concerns(increased pain/decreased activity tolerance)         Walk 10 feet on uneven surface   activity   Assist Walk 10 feet on uneven surfaces activity did not occur: Safety/medical concerns(increased pain/decreased activity tolerance)         Wheelchair     Assist Will patient use wheelchair at discharge?: Yes Type of Wheelchair: Manual    Wheelchair assist level: Supervision/Verbal cueing Max wheelchair distance: 75    Wheelchair 50 feet with 2 turns activity    Assist    Wheelchair 50 feet with 2 turns activity did not occur: Safety/medical concerns(increased pain/decreased activity tolerance)   Assist Level: Supervision/Verbal cueing   Wheelchair 150 feet activity     Assist  Wheelchair 150 feet activity did not occur: Safety/medical concerns(increased pain/decreased activity tolerance)       Blood pressure (!) 116/56, pulse 63, temperature 97.8 F (36.6 C), temperature source Oral, resp. rate 19, height 5' 5.98" (1.676 m), weight 123.8 kg, SpO2 95 %.    Medical Problem List and Plan: 1. R hip periprosthetic fracture s/p ORIF secondary to ground level fall at home due to poor balance. PWB on RLE    CIR PT, OT  2. Antithrombotics: -DVT/anticoagulation:Pharmaceutical:Coumadin  INR borderline therapeutic on 10/5- 2.1  10/9- INR 2.5- therapeutic -antiplatelet therapy: NA 3. Pain Management:  continue gabapentin tid for neuropathy and Norco prn   Tramadol as needed added on 10/4 4. Mood:LCSW to follow for evaluation and support. -antipsychotic agents: N/A 5. Neuropsych: This patient is capable of making decisions on his own behalf. 6. Skin/Wound Care:Monitor wound for healing. Continue nutritional supplement to promote healing. 7. Fluids/Electrolytes/Nutrition:Monitor I/O.  8. Chronic A FIB: Monitor HR tid--continue coreg. Monitor for orthostatic symptoms.  Continue Coumadin 9. CLL: WBC trending back down- is normally in 20s.   WBCs 32.6 on 10/5  Cont to monitor 10 ABLA: Continue to monitor  H/H--stabilizing around 8. On iron supplement.   Hemoglobin 10.2 on 10/5 11. T2DM with hyperglycemia: On lantus 35 units daily with SSI for elevated BS. Monitor BS ac/hs and titrate lantus as indicated.   Decrease Lantus to 36 units on 10/4  Decreased Lantus to 20 units on 10/6 (already received a.m. dose) due to hypoglycemia  Will ensure for bedtime snack CBG (last 3)  Recent Labs    04/02/19 0645 04/02/19 0703 04/02/19 0705  GLUCAP 62* 69* 74  will reduce Lantus to 17U  12.Hyponatremia: Resolved 13. Chronic cough (due to chronic aspiration): per records has refused dysphagia diet. On duonebs tid. On Neurontin for cough? Keep HOB>30 degrees  10/2-Duonebs TID since resp d/c'd AND made prn as well, so doesn't miss a dose with therapy. Will enc flutter valve use 14. Constipation due to opioids-  Scheduled Senokot S 2 tabs BID and use prns as necessary,  10/6- give Mg citrate and suppository/enema if no results- advised pt to stop refusing bowel meds when going.  10/7- had good BMs x2+- doing well  Cont prn meds 15. Poor balance            vestibular PT  16. Elevated K+  K+ 5.1 on 10/5  Labs ordered for tomorrow  10/6- K+ 4.4- much improved with no treatment  17.  Chronic incont related to bladder CA - f/u with urology as OP  LOS: 13 days A FACE TO [place] 04/02/2019, 7:10 AM

## 2019-04-02 NOTE — Progress Notes (Signed)
Physical Therapy Session Note  Patient Details  Name: Oscar Baker MRN: [id number] Date of Birth: [birth year]  Today's Date: 04/02/2019 PT Individual Time: 0803-0905 PT Individual Time Calculation (min): 62 min   Short Term Goals: Week 2:  PT Short Term Goal 1 (Week 2): Patient will perform bed mobility with min A. PT Short Term Goal 2 (Week 2): Patient will perform basic transfers with min A of 1 person. PT Short Term Goal 3 (Week 2): Patient will ambulate >25 feet with CGA using LRAD while maintaining R PWB. PT Short Term Goal 4 (Week 2): Patient will propel w/c 100 ft.  Skilled Therapeutic Interventions/Progress Updates: Pt presented in w/c with wife present agreeable to therapy. Pt stating some discomfort at R hip but primarily with hip flexion. PTA threaded pants for time management and pt ambulated approx 51f with CGA. Pt noted to use BUE to maintain wt bearing restrictions by ambulating with step to pattern and using BUE to offset weight when advancing RLE. Pt propelled remaining distance to rehab gym and performed stand pivot to mat with CGA. Participated in seated and supine therex with PTA assisting in developing HEP.  LAQ* Seated hip flexion * Seated hip abd/add Seated hip ER with orange band*  Supine heel slides (initially AAROM progressed to AROM) with hip flexion* Supine hip abd/add *  Pt also performed standing SLR and hip abd/add but not added to HEP  *added to HEP  Pt ambulated additional 442fwith RW and CGA then propelled remaining distance back to room. Pt remained in w/c at end of session refusing belt alarm but both pt and wife verbalizing understanding that will call for assistance when need to get up. Pt left with call bell within reach and all needs met.      Therapy Documentation Precautions:  Precautions Precautions: Fall Restrictions Weight Bearing Restrictions: Yes RLE Weight Bearing: Partial weight bearing RLE Partial Weight Bearing Percentage or  Pounds: 50    Therapy/Group: Individual Therapy  Kimblery Diop  Kenni Newton, PTA  04/02/2019, 12:52 PM

## 2019-04-02 NOTE — Plan of Care (Signed)
  Problem: Consults Goal: RH GENERAL PATIENT EDUCATION Description: See Patient Education module for education specifics. Outcome: Progressing Goal: Skin Care Protocol Initiated - if Braden Score 18 or less Description: If consults are not indicated, leave blank or document N/A Outcome: Progressing Goal: Diabetes Guidelines if Diabetic/Glucose > 140 Description: If diabetic or lab glucose is > 140 mg/dl - Initiate Diabetes/Hyperglycemia Guidelines & Document Interventions  Outcome: Progressing   Problem: RH BOWEL ELIMINATION Goal: RH STG MANAGE BOWEL WITH ASSISTANCE Description: STG Manage Bowel with min Assistance. Outcome: Progressing Goal: RH STG MANAGE BOWEL W/MEDICATION W/ASSISTANCE Description: STG Manage Bowel with Medication with min Assistance. Outcome: Progressing   Problem: RH SKIN INTEGRITY Goal: RH STG SKIN FREE OF INFECTION/BREAKDOWN Description: Patients skin will remain free from further infection or breakdown with mod assist. Outcome: Progressing Goal: RH STG MAINTAIN SKIN INTEGRITY WITH ASSISTANCE Description: STG Maintain Skin Integrity With mod Assistance. Outcome: Progressing Goal: RH STG ABLE TO PERFORM INCISION/WOUND CARE W/ASSISTANCE Description: STG Able To Perform Incision/Wound Care With total Assistance from caregiver. Outcome: Progressing   Problem: RH SAFETY Goal: RH STG ADHERE TO SAFETY PRECAUTIONS W/ASSISTANCE/DEVICE Description: STG Adhere to Safety Precautions With supervision Assistance/Device. Outcome: Progressing   Problem: RH PAIN MANAGEMENT Goal: RH STG PAIN MANAGED AT OR BELOW PT'S PAIN GOAL Description: < 4 Outcome: Progressing   Problem: Consults Goal: RH GENERAL PATIENT EDUCATION Description: See Patient Education module for education specifics. Outcome: Progressing Goal: Skin Care Protocol Initiated - if Braden Score 18 or less Description: If consults are not indicated, leave blank or document N/A Outcome: Progressing Goal:  Diabetes Guidelines if Diabetic/Glucose > 140 Description: If diabetic or lab glucose is > 140 mg/dl - Initiate Diabetes/Hyperglycemia Guidelines & Document Interventions  Outcome: Progressing   Problem: RH BOWEL ELIMINATION Goal: RH STG MANAGE BOWEL WITH ASSISTANCE Description: STG Manage Bowel with min Assistance. Outcome: Progressing Goal: RH STG MANAGE BOWEL W/MEDICATION W/ASSISTANCE Description: STG Manage Bowel with Medication with min Assistance. Outcome: Progressing   Problem: RH SKIN INTEGRITY Goal: RH STG SKIN FREE OF INFECTION/BREAKDOWN Description: Patients skin will remain free from further infection or breakdown with mod assist. Outcome: Progressing Goal: RH STG MAINTAIN SKIN INTEGRITY WITH ASSISTANCE Description: STG Maintain Skin Integrity With mod Assistance. Outcome: Progressing Goal: RH STG ABLE TO PERFORM INCISION/WOUND CARE W/ASSISTANCE Description: STG Able To Perform Incision/Wound Care With total Assistance from caregiver. Outcome: Progressing   Problem: RH SAFETY Goal: RH STG ADHERE TO SAFETY PRECAUTIONS W/ASSISTANCE/DEVICE Description: STG Adhere to Safety Precautions With supervision Assistance/Device. Outcome: Progressing   Problem: RH PAIN MANAGEMENT Goal: RH STG PAIN MANAGED AT OR BELOW PT'S PAIN GOAL Description: < 4 Outcome: Progressing

## 2019-04-03 ENCOUNTER — Telehealth: Payer: Self-pay | Admitting: Emergency Medicine

## 2019-04-03 ENCOUNTER — Inpatient Hospital Stay (HOSPITAL_COMMUNITY): Payer: Medicare Other

## 2019-04-03 LAB — CBC
HCT: 32.7 % — ABNORMAL LOW (ref 39.0–52.0)
Hemoglobin: 10.1 g/dL — ABNORMAL LOW (ref 13.0–17.0)
MCH: 32.8 pg (ref 26.0–34.0)
MCHC: 30.9 g/dL (ref 30.0–36.0)
MCV: 106.2 fL — ABNORMAL HIGH (ref 80.0–100.0)
Platelets: 362 10*3/uL (ref 150–400)
RBC: 3.08 MIL/uL — ABNORMAL LOW (ref 4.22–5.81)
RDW: 20.8 % — ABNORMAL HIGH (ref 11.5–15.5)
WBC: 27.3 10*3/uL — ABNORMAL HIGH (ref 4.0–10.5)
nRBC: 0.1 % (ref 0.0–0.2)

## 2019-04-03 LAB — BASIC METABOLIC PANEL
Anion gap: 9 (ref 5–15)
BUN: 15 mg/dL (ref 8–23)
CO2: 24 mmol/L (ref 22–32)
Calcium: 8.6 mg/dL — ABNORMAL LOW (ref 8.9–10.3)
Chloride: 104 mmol/L (ref 98–111)
Creatinine, Ser: 0.85 mg/dL (ref 0.61–1.24)
GFR calc Af Amer: >60 mL/min (ref >60)
GFR calc non Af Amer: >60 mL/min (ref >60)
Glucose, Bld: 98 mg/dL (ref 70–99)
Potassium: 4.5 mmol/L (ref 3.5–5.1)
Sodium: 137 mmol/L (ref 135–145)

## 2019-04-03 LAB — PROTIME-INR
INR: 2.5 — ABNORMAL HIGH (ref 0.8–1.2)
Prothrombin Time: 26.4 seconds — ABNORMAL HIGH (ref 11.4–15.2)

## 2019-04-03 LAB — GLUCOSE, CAPILLARY
Glucose-Capillary: 91 mg/dL (ref 70–99)
Glucose-Capillary: 82 mg/dL (ref 70–99)
Glucose-Capillary: 100 mg/dL — ABNORMAL HIGH (ref 70–99)
Glucose-Capillary: 146 mg/dL — ABNORMAL HIGH (ref 70–99)

## 2019-04-03 MED ORDER — ACETAMINOPHEN 325 MG PO TABS: 650.0000 mg | ORAL_TABLET | ORAL | Status: AC | PRN

## 2019-04-03 NOTE — Progress Notes (Signed)
Occupational Therapy Session Note  Patient Details  Name: Oscar Baker MRN: [id number] Date of Birth: 09/26/1932  Today's Date: 04/03/2019 OT Individual Time: 0815-0900 OT Individual Time Calculation (min): 45 min    Short Term Goals: Week 2:  OT Short Term Goal 1 (Week 2): Pt will complete toilet transfers with mod A OT Short Term Goal 2 (Week 2): Pt will complete toileting tasks with max A OT Short Term Goal 3 (Week 2): Pt will complete LB dressing tasks with min A using AE PRN OT Short Term Goal 4 (Week 2): Pt will complete LB bathing tasks with min A using AE PRN  Skilled Therapeutic Interventions/Progress Updates:    Pt resting in bed upon arrival with wife present.  Pt sat EOB using bed rails with supervision.  Pt able to move RLE off EOB without assistance.  Pt requested to take shower this morning.  Pt amb with RW to bathroom with CGA and completed shower with CGA for standing to wash buttocks.  Pt used long handle sponge to bathe BLE. Pt returned to room and completed dressing with sit<>stand from w/c.  Pt required assistance with donning Ted hose and socks but completed all other dressing tasks with CGA for standing balance to pull pants over hips. Pt completed grooming tasks seated in w/c at sink.  Pt remained seated in w/c with all needs within reach and wife present.   Therapy Documentation Precautions:  Precautions Precautions: Fall Restrictions Weight Bearing Restrictions: Yes RLE Weight Bearing: Partial weight bearing RLE Partial Weight Bearing Percentage or Pounds: 50   Pain: Pain Assessment Pain Scale: 0-10 Pain Score: 0-No pain   Therapy/Group: Individual Therapy  [name] 04/03/2019, 9:16 AM

## 2019-04-03 NOTE — Plan of Care (Signed)
  Problem: RH Ambulation Goal: LTG Patient will ambulate in controlled environment (PT) Description: LTG: Patient will ambulate in a controlled environment, # of feet with assistance (PT). 04/03/2019 1614 by Miracle Criado, [place], PT Flowsheets Taken 04/03/2019 1614 LTG: Pt will ambulate in controlled environ  assist needed:: (upgraded goal) Supervision/Verbal cueing Taken 04/03/2019 1613 LTG: Ambulation distance in controlled environment: 100' Note: Upgraded goal due to patient's progress with functional mobility and decreased pain with mobility. 04/03/2019 1613 by Doreene Burke, PT Flowsheets (Taken 04/03/2019 1613) LTG: Pt will ambulate in controlled environ  assist needed:: Supervision/Verbal cueing LTG: Ambulation distance in controlled environment: 100' Goal: LTG Patient will ambulate in home environment (PT) Description: LTG: Patient will ambulate in home environment, # of feet with assistance (PT). 04/03/2019 1614 by Siyona Coto, [place], PT Flowsheets Taken 04/03/2019 1614 LTG: Pt will ambulate in home environ  assist needed:: (upgraded goal) Supervision/Verbal cueing Taken 04/03/2019 1613 LTG: Ambulation distance in home environment: 50' Note: Upgraded goal due to patient's progress with functional mobility and decreased pain with mobility. 04/03/2019 1613 by Apolinar Junes L, PT Flowsheets (Taken 04/03/2019 1613) LTG: Pt will ambulate in home environ  assist needed:: Supervision/Verbal cueing LTG: Ambulation distance in home environment: 68'

## 2019-04-03 NOTE — Plan of Care (Signed)
  Problem: Consults Goal: RH GENERAL PATIENT EDUCATION Description: See Patient Education module for education specifics. Outcome: Progressing Goal: Skin Care Protocol Initiated - if Braden Score 18 or less Description: If consults are not indicated, leave blank or document N/A Outcome: Progressing Goal: Diabetes Guidelines if Diabetic/Glucose > 140 Description: If diabetic or lab glucose is > 140 mg/dl - Initiate Diabetes/Hyperglycemia Guidelines & Document Interventions  Outcome: Progressing   Problem: RH BOWEL ELIMINATION Goal: RH STG MANAGE BOWEL WITH ASSISTANCE Description: STG Manage Bowel with min Assistance. Outcome: Progressing Goal: RH STG MANAGE BOWEL W/MEDICATION W/ASSISTANCE Description: STG Manage Bowel with Medication with min Assistance. Outcome: Progressing   Problem: RH SKIN INTEGRITY Goal: RH STG SKIN FREE OF INFECTION/BREAKDOWN Description: Patients skin will remain free from further infection or breakdown with mod assist. Outcome: Progressing Goal: RH STG MAINTAIN SKIN INTEGRITY WITH ASSISTANCE Description: STG Maintain Skin Integrity With mod Assistance. Outcome: Progressing Goal: RH STG ABLE TO PERFORM INCISION/WOUND CARE W/ASSISTANCE Description: STG Able To Perform Incision/Wound Care With total Assistance from caregiver. Outcome: Progressing   Problem: RH SAFETY Goal: RH STG ADHERE TO SAFETY PRECAUTIONS W/ASSISTANCE/DEVICE Description: STG Adhere to Safety Precautions With supervision Assistance/Device. Outcome: Progressing   Problem: RH PAIN MANAGEMENT Goal: RH STG PAIN MANAGED AT OR BELOW PT'S PAIN GOAL Description: < 4 Outcome: Progressing

## 2019-04-03 NOTE — Progress Notes (Addendum)
Physical Therapy Session Note  Patient Details  Name: Oscar Baker MRN: [id number] Date of Birth: 1932-10-22  Today's Date: 04/03/2019 PT Individual Time: 1116-1210 PT Individual Time Calculation (min): 54 min   Short Term Goals: Week 2:  PT Short Term Goal 1 (Week 2): Patient will perform bed mobility with min A. PT Short Term Goal 2 (Week 2): Patient will perform basic transfers with min A of 1 person. PT Short Term Goal 3 (Week 2): Patient will ambulate >25 feet with CGA using LRAD while maintaining R PWB. PT Short Term Goal 4 (Week 2): Patient will propel w/c 100 ft.  Skilled Therapeutic Interventions/Progress Updates:     Patient in w/c asleep with his wife at bedside upon PT arrival. Patient was easily aroused to tactile stimulation and agreeable to PT session. He denied pain throughout session.Patient's wife stated that she noticed that his TED hose were digging into him and that she pulled them down as she was concerned about this. PT noted increased B LE pitting edema and removed B TED hose as the both had produces significant indentations in his legs, RN made aware and provided thigh high TEDs during session and discussed changing medications with PA and family.   Therapeutic Activity: Transfers: Patient performed sit to/from stand x2 with supervision using the RW, using B UEs to push up form the armrests of the w/c. He alsor performed car transfers x1 performing stand pivot with heavy min A to get up from a sedan height seat, 19 inches, and x1 performing a squat pivot with min A-CGA from the same height. Provided verbal cues for hand placement, leaning seat back to bring R LE into the vehicle using B UEs, and w/c set up for squat pivot transfer. Patient was unsure which transfer felt better and requested to continue trying both with therapies prior to d/c. Patient voided while standing in the bathroom with CGA, he was adamant about performing in this way instead of sitting at  therapist suggested. Educated on increased fall risk and risk of incontinence with this technique, patient stated understanding after.   Gait Training:  Patient ambulated 10 feet and 15 feet in the room using RW with close supervision. Ambulated with step-to gait pattern leading with R, with decreased stance time on R, decreased step height and length, and downward head gaze. Provided verbal cues for looking ahead and increased foot clearance.  Wheelchair Mobility:  Patient propelled wheelchair 125 feet with supervision using B UEs. Provided verbal cues for turning technique and using of breaks throughout session.   Therapeutic Exercise: Patient performed the following exercises with verbal and tactile cues for proper technique. -B LAQs 2x10 -B marching 2x10 -B seated hip external rotation 2x10  Patient in w/c with his wife in the room at end of session with breaks locked and all needs within reach.    Therapy Documentation Precautions:  Precautions Precautions: Fall Restrictions Weight Bearing Restrictions: Yes RLE Weight Bearing: Partial weight bearing RLE Partial Weight Bearing Percentage or Pounds: 50    Therapy/Group: Individual Therapy  Antony Sian L Le Faulcon PT, DPT  04/03/2019, 12:58 PM

## 2019-04-03 NOTE — Progress Notes (Signed)
[facility] for warfarin Indication: atrial fibrillation  Patient Measurements: Height: 5' 5.98" (167.6 cm) Weight: 272 lb 15.6 oz (123.8 kg) IBW/kg (Calculated) : 63.75  Vital Signs: Temp: 98.3 F (36.8 C) (10/12 0408) Temp Source: Oral (10/12 0408) BP: 105/57 (10/12 0408) Pulse Rate: 66 (10/12 0408)  Labs: Recent Labs    04/01/19 0529 04/03/19 0722  HGB  --  10.1*  HCT  --  32.7*  PLT  --  362  LABPROT 27.2* 26.4*  INR 2.6* 2.5*  CREATININE  --  0.85    Estimated Creatinine Clearance: 80.3 mL/min (by C-G formula based on SCr of 0.85 mg/dL).   Assessment: 84 YOM on warfarin for atrial fibrillation. Warfarin resumed on 9/23 s/p THA surgery. INR has been therapeutic and relatively stable.  PO intake has been stable per charting.  No bleeding reported, CBC stable.  Home regimen: 5mg  PO daily except 7.5mg  on Tues and Sat.    Goal of Therapy:  INR 2-3 Monitor platelets by anticoagulation protocol: Yes   Plan:  Continue Coumadin 5mg  PO daily at 1800 Change INR to MWF only   Thank you for involving pharmacy in this patient's care.  Renold Genta, PharmD, BCPS Clinical Pharmacist Clinical phone for 04/03/2019 until 3p is [phone] 04/03/2019 1:00 PM  **Pharmacist phone directory can be found on [email or web address] listed under [place]**

## 2019-04-03 NOTE — Progress Notes (Signed)
Occupational Therapy Session Note  Patient Details  Name: Oscar Baker MRN: [id number] Date of Birth: 11-24-1932  Today's Date: 04/03/2019 OT Individual Time: 1345-1425 OT Individual Time Calculation (min): 40 min    Short Term Goals: Week 2:  OT Short Term Goal 1 (Week 2): Pt will complete toilet transfers with mod A OT Short Term Goal 2 (Week 2): Pt will complete toileting tasks with max A OT Short Term Goal 3 (Week 2): Pt will complete LB dressing tasks with min A using AE PRN OT Short Term Goal 4 (Week 2): Pt will complete LB bathing tasks with min A using AE PRN  Skilled Therapeutic Interventions/Progress Updates:    Pt requested to use toilet upon arrival.  Pt amb with RW to bathroom (CGA).  Pt able to doff pants and assist with pulling pants up after BM but required assistance with hygiene. Pt performed sit<>stand X5 during session.  Pt stated that PA requested his BLE be elevated but pt does not like elevating leg rests.  Pt comments that if he lays back in bed he will fall asleep.  Pt transferred to recliner sitting on Roho cushion with BLE elevated. Pt reports that current position comfortable.  RN notifed. Pt remained in recliner with all needs within reach.  Therapy Documentation Precautions:  Precautions Precautions: Fall Restrictions Weight Bearing Restrictions: Yes RLE Weight Bearing: Partial weight bearing RLE Partial Weight Bearing Percentage or Pounds: 50 Pain:  Pt denies pain this afternoon   Therapy/Group: Individual Therapy  [name] 04/03/2019, 2:36 PM

## 2019-04-03 NOTE — Progress Notes (Signed)
[facility] PH[facility]ICAL MEDICINE & REHABILITATION PROGRESS NOTE   Subjective/Complaints:  Pt's DM meds reduced yesterday since had low BGs in AM x 2 days-  Per his wife, he cleans his plate daily, so don't think that is the issue. Went down to 44 and 60 those 2 times.  Also checking on bowel meds- not too much, not too little right now- advised him not to refuse bowel meds.  ROS: Denies CP, SOB, N/V/D  Objective:   No results found. Recent Labs    04/03/19 0722  WBC 27.3*  HGB 10.1*  HCT 32.7*  PLT 362   Recent Labs    04/03/19 0722  NA 137  K 4.5  CL 104  CO2 24  GLUCOSE 98  BUN 15  CREATININE 0.85  CALCIUM 8.6*    Intake/Output Summary (Last 24 hours) at 04/03/2019 1029 Last data filed at 04/03/2019 0630 Gross per 24 hour  Intake 480 ml  Output 400 ml  Net 80 ml     Physical Exam: Vital Signs Blood pressure (!) 105/57, pulse 66, temperature 98.3 F (36.8 C), temperature source Oral, resp. rate 16, height 5' 5.98" (1.676 m), weight 123.8 kg, SpO2 95 %.      Physical Exam Constitutional: No distress . Vital signs and labs reviewed. Sitting up in bed; laughing a lot; wife at bedside; bright affect, NAD HENT: Normocephalic.  Atraumatic. Eyes: EOMI.  Conjugate gaze Cardiovascular: .irregular- rate controlled Respiratory: Normal effort.  No stridor. CTA B/L after cleared/coughed which is chronic GI: Non-distended. Soft , NT, (+)BS Skin: Right hip with dressing C/D/I- incision looks great- almost healed- no drainage; no erythema; mild edema Psych: bright affect Musc: Right hip edema with tenderness. Neuro: Alert HOH Motor: Bilateral upper extremities: 5/5 proximal distal Left lower extremity: Hip flexion, knee extension 4-- 4/5, ankle dorsiflexion 5/5 Right lower extremity: Hip flexion, knee extension 2/5, ankle dorsiflexion 5/5  Assessment/Plan: 1. Functional deficits secondary to R periprosthetic hip fracture due to ground level fall and poor balance  which require 3+ hours per day of interdisciplinary therapy in a comprehensive inpatient rehab setting.  Physiatrist is providing close team supervision and 24 hour management of active medical problems listed below.  Physiatrist and rehab team continue to assess barriers to discharge/monitor patient progress toward functional and medical goals  Care Tool:  Bathing    Body parts bathed by patient: Right arm, Left arm, Chest, Abdomen, Front perineal area, Right upper leg, Left upper leg, Right lower leg, Left lower leg, Buttocks   Body parts bathed by helper: Buttocks     Bathing assist Assist Level: Contact Guard/Touching assist     Upper Body Dressing/Undressing Upper body dressing   What is the patient wearing?: Pull over shirt    Upper body assist Assist Level: Set up assist    Lower Body Dressing/Undressing Lower body dressing      What is the patient wearing?: Incontinence brief, Pants     Lower body assist Assist for lower body dressing: Minimal Assistance - Patient > 75%     Toileting Toileting    Toileting assist Assist for toileting: Moderate Assistance - Patient 50 - 74%     Transfers Chair/bed transfer  Transfers assist     Chair/bed transfer assist level: Contact Guard/Touching assist Chair/bed transfer assistive device: Programmer, multimedia   Ambulation assist   Ambulation activity did not occur: Safety/medical concerns(increased pain/decreased activity tolerance)  Assist level: Contact Guard/Touching assist Assistive device: Walker-rolling Max distance: 65  Walk 10 feet activity   Assist  Walk 10 feet activity did not occur: Safety/medical concerns(increased pain/decreased activity tolerance)  Assist level: Contact Guard/Touching assist Assistive device: Walker-rolling   Walk 50 feet activity   Assist Walk 50 feet with 2 turns activity did not occur: Safety/medical concerns(increased pain/decreased activity  tolerance)  Assist level: Contact Guard/Touching assist Assistive device: Walker-rolling    Walk 150 feet activity   Assist Walk 150 feet activity did not occur: Safety/medical concerns(increased pain/decreased activity tolerance)         Walk 10 feet on uneven surface  activity   Assist Walk 10 feet on uneven surfaces activity did not occur: Safety/medical concerns(increased pain/decreased activity tolerance)         Wheelchair     Assist Will patient use wheelchair at discharge?: Yes Type of Wheelchair: Manual    Wheelchair assist level: Supervision/Verbal cueing Max wheelchair distance: 75    Wheelchair 50 feet with 2 turns activity    Assist    Wheelchair 50 feet with 2 turns activity did not occur: Safety/medical concerns(increased pain/decreased activity tolerance)   Assist Level: Supervision/Verbal cueing   Wheelchair 150 feet activity     Assist  Wheelchair 150 feet activity did not occur: Safety/medical concerns(increased pain/decreased activity tolerance)       Blood pressure (!) 105/57, pulse 66, temperature 98.3 F (36.8 C), temperature source Oral, resp. rate 16, height 5' 5.98" (1.676 m), weight 123.8 kg, SpO2 95 %.    Medical Problem List and Plan: 1. R hip periprosthetic fracture s/p ORIF secondary to ground level fall at home due to poor balance. PWB on RLE    CIR PT, OT  2. Antithrombotics: -DVT/anticoagulation:Pharmaceutical:Coumadin  INR borderline therapeutic on 10/5- 2.1  10/12- INR 2.5- therapeutic -antiplatelet therapy: NA 3. Pain Management:  continue gabapentin tid for neuropathy and Norco prn   Tramadol as needed added on 10/4 4. Mood:LCSW to follow for evaluation and support. -antipsychotic agents: N/A 5. Neuropsych: This patient is capable of making decisions on his own behalf. 6. Skin/Wound Care:Monitor wound for healing. Continue nutritional supplement to promote  healing. 7. Fluids/Electrolytes/Nutrition:Monitor I/O.  8. Chronic A FIB: Monitor HR tid--continue coreg. Monitor for orthostatic symptoms.  Continue Coumadin 9. CLL: WBC trending back down- is normally in 20s.   WBCs 32.6 on 10/5  10/12- WBC down to 27k- finally close to his baseline  Cont to monitor 10 ABLA: Continue to monitor H/H--stabilizing around 8. On iron supplement.   10/12- Hb up to 10.1 11. T2DM with hyperglycemia: On lantus 35 units daily with SSI for elevated BS. Monitor BS ac/hs and titrate lantus as indicated.   Decrease Lantus to 36 units on 10/4  Decreased Lantus to 20 units on 10/6 (already received a.m. dose) due to hypoglycemia  Will ensure for bedtime snack CBG (last 3)  Recent Labs    04/02/19 1644 04/02/19 2100 04/03/19 0617  GLUCAP 133* 147* 91  will reduce Lantus to 17U  10/12- no hypoglycemic episodes with reduction in Lantus.   12.Hyponatremia: Resolved 13. Chronic cough (due to chronic aspiration): per records has refused dysphagia diet. On duonebs tid. On Neurontin for cough? Keep HOB>30 degrees  10/2-Duonebs TID since resp d/c'd AND made prn as well, so doesn't miss a dose with therapy. Will enc flutter valve use 14. Constipation due to opioids-  Scheduled Senokot S 2 tabs BID and use prns as necessary,  10/6- give Mg citrate and suppository/enema if no results- advised pt to stop refusing  bowel meds when going.  10/7- had good BMs x2+- doing well  Cont prn meds 15. Poor balance            vestibular PT  16. Elevated K+  K+ 5.1 on 10/5  Labs ordered for tomorrow  10/6- K+ 4.4- much improved with no treatment  17.  Chronic incont related to bladder CA - f/u with urology as OP     LOS: 14 days A FACE TO FACE EVALUATION WAS PERFORMED  Gurveer Colucci 04/03/2019, 10:29 AM

## 2019-04-03 NOTE — Telephone Encounter (Signed)
Spoke with Opal Sidles (wife) per DPR . Patient scheduled to come home from rehab facility on Monday. Will take remote monitor and set up in patient's room if not discharged next Monday.

## 2019-04-04 ENCOUNTER — Inpatient Hospital Stay (HOSPITAL_COMMUNITY): Payer: Medicare Other

## 2019-04-04 DIAGNOSIS — R600 Localized edema: Secondary | ICD-10-CM

## 2019-04-04 LAB — GLUCOSE, CAPILLARY
Glucose-Capillary: 94 mg/dL (ref 70–99)
Glucose-Capillary: 93 mg/dL (ref 70–99)
Glucose-Capillary: 168 mg/dL — ABNORMAL HIGH (ref 70–99)
Glucose-Capillary: 150 mg/dL — ABNORMAL HIGH (ref 70–99)

## 2019-04-04 MED ORDER — FUROSEMIDE 20 MG PO TABS
10.0000 mg | ORAL_TABLET | Freq: Every day | ORAL | Status: DC
  Administered 2019-04-04 – 2019-04-07 (×4): 10 mg via ORAL
  Filled 2019-04-04 (×4): qty 1
  Filled 2019-04-04: qty 0.5

## 2019-04-04 MED ORDER — FUROSEMIDE 40 MG PO TABS
40.0000 mg | ORAL_TABLET | Freq: Once | ORAL | Status: AC
  Administered 2019-04-04: 10:00:00 40 mg via ORAL
  Filled 2019-04-04: qty 1

## 2019-04-04 NOTE — Progress Notes (Signed)
Occupational Therapy Weekly Progress Note  Patient Details  Name: Oscar Baker MRN: [id number] Date of Birth: 1932/08/10  Beginning of progress report period: March 28, 2019 End of progress report period: April 04, 2019  Patient has met 4 of 4 short term goals. Pt made excellent progress with BADLs, functional transfers, and functional amb during the past week. Pt performs toilet transfers with CGA and TTB transfers with min A for RLE.  Pt uses AE appropriately to assist with LB bathing/dressing tasks.  Pt requires min A for LB dressing tasks and CGA for bathing tasks when standing. Pt currently requires max A for toileting tasks. Pt's wife has been present for therapy sessions and is pleased with progress.  Pt's wife will not be able to provide physical assist after discharge.  Patient continues to demonstrate the following deficits: muscle weakness, decreased cardiorespiratoy endurance, decreased coordination, decreased problem solving and decreased safety awareness and decreased standing balance and decreased postural control and therefore will continue to benefit from skilled OT intervention to enhance overall performance with BADL.  Patient progressing toward long term goals..  Continue plan of care.  OT Short Term Goals Week 2:  OT Short Term Goal 1 (Week 2): Pt will complete toilet transfers with mod A OT Short Term Goal 1 - Progress (Week 2): Met OT Short Term Goal 2 (Week 2): Pt will complete toileting tasks with max A OT Short Term Goal 2 - Progress (Week 2): Met OT Short Term Goal 3 (Week 2): Pt will complete LB dressing tasks with min A using AE PRN OT Short Term Goal 3 - Progress (Week 2): Met OT Short Term Goal 4 (Week 2): Pt will complete LB bathing tasks with min A using AE PRN OT Short Term Goal 4 - Progress (Week 2): Met Week 3:  OT Short Term Goal 1 (Week 3): STG=LTG secondary to ELOS   [name] 04/04/2019, 6:37 AM

## 2019-04-04 NOTE — Progress Notes (Signed)
[facility] PHYSICAL MEDICINE & REHABILITATION PROGRESS NOTE   Subjective/Complaints:  Pt reports having a lot of LE edema- was worse yesterday but still an issue this AM. Was on Lasix 20 mEq daily prn at home, but took it most days-   Denies any pain so far this AM.  ROS: Denies CP, SOB, N/V/D  Objective:   No results found. Recent Labs    04/03/19 0722  WBC 27.3*  HGB 10.1*  HCT 32.7*  PLT 362   Recent Labs    04/03/19 0722  NA 137  K 4.5  CL 104  CO2 24  GLUCOSE 98  BUN 15  CREATININE 0.85  CALCIUM 8.6*    Intake/Output Summary (Last 24 hours) at 04/04/2019 0851 Last data filed at 04/04/2019 0557 Gross per 24 hour  Intake 480 ml  Output 850 ml  Net -370 ml     Physical Exam: Vital Signs Blood pressure 110/66, pulse 83, temperature 97.7 F (36.5 C), resp. rate 17, height 5' 5.98" (1.676 m), weight 90.9 kg, SpO2 97 %.      Physical Exam Constitutional: No distress . Vital signs and labs reviewed. Sitting up in bedside chair; PT in room along with nurse; bright affect, NAD HENT: Normocephalic.  Atraumatic. Eyes: EOMI.  Conjugate gaze Cardiovascular: .irregular- rate controlled Respiratory: Normal effort.  No stridor. CTA B/L after cleared/coughed which is chronic GI: Non-distended. Soft , NT, (+)BS Skin: Right hip with dressing C/D/I- incision looks great- almost healed- no drainage; no erythema; mild edema 2-3+ LE edema to mid calf B/L- pitting Psych: bright affect Musc: Right hip edema with tenderness. Neuro: Alert HOH Motor: Bilateral upper extremities: 5/5 proximal distal Left lower extremity: Hip flexion, knee extension 4-- 4/5, ankle dorsiflexion 5/5 Right lower extremity: Hip flexion, knee extension 2/5, ankle dorsiflexion 5/5  Assessment/Plan: 1. Functional deficits secondary to R periprosthetic hip fracture due to ground level fall and poor balance which require 3+ hours per day of interdisciplinary therapy in a comprehensive inpatient  rehab setting.  Physiatrist is providing close team supervision and 24 hour management of active medical problems listed below.  Physiatrist and rehab team continue to assess barriers to discharge/monitor patient progress toward functional and medical goals  Care Tool:  Bathing    Body parts bathed by patient: Right arm, Left arm, Chest, Abdomen, Front perineal area, Right upper leg, Left upper leg, Right lower leg, Left lower leg, Buttocks   Body parts bathed by helper: Buttocks     Bathing assist Assist Level: Contact Guard/Touching assist     Upper Body Dressing/Undressing Upper body dressing   What is the patient wearing?: Pull over shirt    Upper body assist Assist Level: Set up assist    Lower Body Dressing/Undressing Lower body dressing      What is the patient wearing?: Incontinence brief, Pants     Lower body assist Assist for lower body dressing: Minimal Assistance - Patient > 75%     Toileting Toileting    Toileting assist Assist for toileting: Minimal Assistance - Patient > 75%     Transfers Chair/bed transfer  Transfers assist     Chair/bed transfer assist level: Contact Guard/Touching assist Chair/bed transfer assistive device: Programmer, multimedia   Ambulation assist   Ambulation activity did not occur: Safety/medical concerns(increased pain/decreased activity tolerance)  Assist level: Supervision/Verbal cueing Assistive device: Walker-rolling Max distance: 15'   Walk 10 feet activity   Assist  Walk 10 feet activity did not occur:  Safety/medical concerns(increased pain/decreased activity tolerance)  Assist level: Supervision/Verbal cueing Assistive device: Walker-rolling   Walk 50 feet activity   Assist Walk 50 feet with 2 turns activity did not occur: Safety/medical concerns(increased pain/decreased activity tolerance)  Assist level: Contact Guard/Touching assist Assistive device: Walker-rolling    Walk 150 feet  activity   Assist Walk 150 feet activity did not occur: Safety/medical concerns(increased pain/decreased activity tolerance)         Walk 10 feet on uneven surface  activity   Assist Walk 10 feet on uneven surfaces activity did not occur: Safety/medical concerns(increased pain/decreased activity tolerance)         Wheelchair     Assist Will patient use wheelchair at discharge?: Yes Type of Wheelchair: Manual    Wheelchair assist level: Supervision/Verbal cueing Max wheelchair distance: 125'    Wheelchair 50 feet with 2 turns activity    Assist    Wheelchair 50 feet with 2 turns activity did not occur: Safety/medical concerns(increased pain/decreased activity tolerance)   Assist Level: Supervision/Verbal cueing   Wheelchair 150 feet activity     Assist  Wheelchair 150 feet activity did not occur: Safety/medical concerns(increased pain/decreased activity tolerance)       Blood pressure 110/66, pulse 83, temperature 97.7 F (36.5 C), resp. rate 17, height 5' 5.98" (1.676 m), weight 90.9 kg, SpO2 97 %.    Medical Problem List and Plan: 1. R hip periprosthetic fracture s/p ORIF secondary to ground level fall at home due to poor balance. PWB on RLE    CIR PT, OT  2. Antithrombotics: -DVT/anticoagulation:Pharmaceutical:Coumadin  INR borderline therapeutic on 10/5- 2.1  10/12- INR 2.5- therapeutic -antiplatelet therapy: NA 3. Pain Management:  continue gabapentin tid for neuropathy and Norco prn   Tramadol as needed added on 10/4 4. Mood:LCSW to follow for evaluation and support. -antipsychotic agents: N/A 5. Neuropsych: This patient is capable of making decisions on his own behalf. 6. Skin/Wound Care:Monitor wound for healing. Continue nutritional supplement to promote healing. 7. Fluids/Electrolytes/Nutrition:Monitor I/O.  8. Chronic A FIB: Monitor HR tid--continue coreg. Monitor for orthostatic  symptoms.  Continue Coumadin 9. CLL: WBC trending back down- is normally in 20s.   WBCs 32.6 on 10/5  10/12- WBC down to 27k- finally close to his baseline  Cont to monitor 10 ABLA: Continue to monitor H/H--stabilizing around 8. On iron supplement.   10/12- Hb up to 10.1 11. T2DM with hyperglycemia: On lantus 35 units daily with SSI for elevated BS. Monitor BS ac/hs and titrate lantus as indicated.   Decrease Lantus to 36 units on 10/4  Decreased Lantus to 20 units on 10/6 (already received a.m. dose) due to hypoglycemia  Will ensure for bedtime snack CBG (last 3)  Recent Labs    04/03/19 1711 04/03/19 2115 04/04/19 0615  GLUCAP 100* 146* 94  will reduce Lantus to 17U  10/12- no hypoglycemic episodes with reduction in Lantus.   12.Hyponatremia: Resolved 13. Chronic cough (due to chronic aspiration): per records has refused dysphagia diet. On duonebs tid. On Neurontin for cough? Keep HOB>30 degrees  10/2-Duonebs TID since resp d/c'd AND made prn as well, so doesn't miss a dose with therapy. Will enc flutter valve use 14. Constipation due to opioids-  Scheduled Senokot S 2 tabs BID and use prns as necessary,  10/6- give Mg citrate and suppository/enema if no results- advised pt to stop refusing bowel meds when going.  10/7- had good BMs x2+- doing well  Cont prn meds 15. Poor balance  vestibular PT  16. Elevated K+  K+ 5.1 on 10/5  Labs ordered for tomorrow  10/6- K+ 4.4- much improved with no treatment  17.  Chronic incont related to bladder CA - f/u with urology as OP  18. LE edema  10/13- will add Lasix 10 mEq daily and give Lasix 40 mEq x1- will recheck in AM.    LOS: 15 days A FACE TO FACE EVALUATION WAS PERFORMED  Miraya Cudney 04/04/2019, 8:51 AM

## 2019-04-04 NOTE — Progress Notes (Signed)
Occupational Therapy Session Note  Patient Details  Name: Oscar Baker MRN: [id number] Date of Birth: Nov 17, 1932  Today's Date: 04/04/2019 OT Individual Time: 1015-1100 OT Individual Time Calculation (min): 45 min    Short Term Goals: Week 3:  OT Short Term Goal 1 (Week 3): STG=LTG secondary to ELOS  Skilled Therapeutic Interventions/Progress Updates:    Pt resting in recliner upon arrival.  Pt stated he was "worn out" from early PT session but agreeable to therapy.  Pt stated he didn't want to do any walking.  Pt agreeable to BUE therex seated in recliner.  BUT therex with 4# bar a 1 kg ball.  Chest presses and rowing with bar (3X15). Diagonals with ball (3X10). Pt agreeable to practice sit<>stand from recliner X 4. Pt requested to use toilet and amb with RW to bathroom.  Pt insistent on standing to urinate.  Pt stood with CGA but required max A for toileting tasks.  Pt returned to room and remained seated in recliner with wife present. All needs within reach.   Therapy Documentation Precautions:  Precautions Precautions: Fall Restrictions Weight Bearing Restrictions: Yes RLE Weight Bearing: Partial weight bearing RLE Partial Weight Bearing Percentage or Pounds: 50   Pain: Pt denies pain   Therapy/Group: Individual Therapy  [name] 04/04/2019, 12:09 PM

## 2019-04-04 NOTE — Plan of Care (Signed)
  Problem: Consults Goal: RH GENERAL PATIENT EDUCATION Description: See Patient Education module for education specifics. Outcome: Progressing Goal: Skin Care Protocol Initiated - if Braden Score 18 or less Description: If consults are not indicated, leave blank or document N/A Outcome: Progressing Goal: Diabetes Guidelines if Diabetic/Glucose > 140 Description: If diabetic or lab glucose is > 140 mg/dl - Initiate Diabetes/Hyperglycemia Guidelines & Document Interventions  Outcome: Progressing   Problem: RH BOWEL ELIMINATION Goal: RH STG MANAGE BOWEL WITH ASSISTANCE Description: STG Manage Bowel with min Assistance. Outcome: Progressing Goal: RH STG MANAGE BOWEL W/MEDICATION W/ASSISTANCE Description: STG Manage Bowel with Medication with min Assistance. Outcome: Progressing   Problem: RH SKIN INTEGRITY Goal: RH STG SKIN FREE OF INFECTION/BREAKDOWN Description: Patients skin will remain free from further infection or breakdown with mod assist. Outcome: Progressing Goal: RH STG MAINTAIN SKIN INTEGRITY WITH ASSISTANCE Description: STG Maintain Skin Integrity With mod Assistance. Outcome: Progressing Goal: RH STG ABLE TO PERFORM INCISION/WOUND CARE W/ASSISTANCE Description: STG Able To Perform Incision/Wound Care With total Assistance from caregiver. Outcome: Progressing   Problem: RH SAFETY Goal: RH STG ADHERE TO SAFETY PRECAUTIONS W/ASSISTANCE/DEVICE Description: STG Adhere to Safety Precautions With supervision Assistance/Device. Outcome: Progressing   Problem: RH PAIN MANAGEMENT Goal: RH STG PAIN MANAGED AT OR BELOW PT'S PAIN GOAL Description: < 4 Outcome: Progressing

## 2019-04-04 NOTE — Progress Notes (Signed)
Physical Therapy Weekly Progress Note  Patient Details  Name: Oscar Baker MRN: [id number] Date of Birth: Nov 18, 1932  Beginning of progress report period: March 28, 2019 End of progress report period: April 04, 2019  Today's Date: 04/04/2019 PT Individual Time: 4010-2725 PT Individual Time Calculation (min): 45 min   Patient has met 3 of 3 short term goals.  Patient is progressing well toward his LTGs and is on target for meeting goals by set d/c date of 10/20. He currently requires close supervision for bed mobility, transfers, gait up to 160', and w/c mobility, and requires min A for car transfers at this time. Will monitor ambulation status and d/c w/c goals as patient progresses if patient continues to progress as a functional Ambulator. Will continue to benefit from skilled PT to address R LE strength/ROM, balance, functional mobility training and patient/family education, including falls risk education/prevention.   Patient continues to demonstrate the following deficits muscle weakness and muscle joint tightness, decreased cardiorespiratoy endurance and decreased sitting balance, decreased standing balance, decreased postural control, decreased balance strategies and difficulty maintaining precautions and therefore will continue to benefit from skilled PT intervention to increase functional independence with mobility.  Patient progressing toward long term goals..  Continue plan of care.  PT Short Term Goals Week 2:  PT Short Term Goal 1 (Week 2): Patient will perform bed mobility with min A. PT Short Term Goal 1 - Progress (Week 2): Met PT Short Term Goal 2 (Week 2): Patient will perform basic transfers with min A of 1 person. PT Short Term Goal 3 (Week 2): Patient will ambulate >25 feet with CGA using LRAD while maintaining R PWB. PT Short Term Goal 3 - Progress (Week 2): Met PT Short Term Goal 4 (Week 2): Patient will propel w/c 100 ft. PT Short Term Goal 4 - Progress (Week 2):  Met Week 3:  PT Short Term Goal 1 (Week 3): STG=LTG due to ELOS.  Skilled Therapeutic Interventions/Progress Updates:     Patient in recliner with RN in room upon PT arrival. Patient alert and agreeable to PT session. Patient reported 1/10 progressing to 7/10 R hip pain during session, RN made aware. PT provided repositioning, rest breaks, and distraction as pain interventions throughout session. PT noted B LE edema, improved from yesterday R>L. Donned B thigh high TED hose with total A at beginning of session for edema control. Dr. Dagoberto Ligas, MD rounded during and discussed edema and medications with patient.   Therapeutic Activity: Bed Mobility: Patient performed supine to/from sit on a mat table with supervision using a gait belt for lifting R LE on/off the mat table. Provided verbal cues for . Transfers: Patient performed sit to/from stand x1 and stand pivot x1 with supervision using the RW. Provided verbal cues for sequencing for stand pivot.  Gait Training:  Patient ambulated 160 feet using Rw with close supervision. Ambulated with step-to gait pattern leading with R to maintain PWB, increased trunk flexion, decreased stance time on R, and downward head gaze. Provided verbal cues for erect posture, looking ahead, and staying close to the RW.  Therapeutic Exercise: Patient performed the following exercises from HEP provided in the room with verbal and tactile cues for proper technique. -R hip abduction in supine x10 with gait belt for self assist -R heel slides x10 with gait belt for self assist -B LAQ x15 -B marching x10 -B seated hip external rotation x10  Patient in recliner in room with B LEs elevated at end of  session with breaks locked and all needs within reach.    Therapy Documentation Precautions:  Precautions Precautions: Fall Restrictions Weight Bearing Restrictions: Yes RLE Weight Bearing: Partial weight bearing RLE Partial Weight Bearing Percentage or Pounds:  50   Therapy/Group: Individual Therapy  Lysbeth Dicola L Radonna Bracher PT, DPT  04/04/2019, 12:35 PM

## 2019-04-04 NOTE — Progress Notes (Signed)
Occupational Therapy Session Note  Patient Details  Name: Oscar Baker MRN: [id number] Date of Birth: [birth year]  Today's Date: 04/04/2019 OT Individual Time: 1300-1340 OT Individual Time Calculation (min): 40 min    Short Term Goals: Week 3:  OT Short Term Goal 1 (Week 3): STG=LTG secondary to ELOS  Skilled Therapeutic Interventions/Progress Updates:    OT intervention with focus on functional amb with RW, TTB transfers, standing balance, discharge planning, educaiton, activity tolerance, and safety awareness. Pt practiced TTB transfers X 3. Pt uses gait belt to assist with lifting RLE into/out of tub.  Pt completed all transfers with supervision.  Recommended raising TTB to highest setting to facilitate easier sit>stand. Two grab bars in tub at home.  Pt has grab bar beside toilet at home and do not want a BSC. Pt returned to room and remained in recliner with wife present.  All needs within reach.   Therapy Documentation Precautions:  Precautions Precautions: Fall Restrictions Weight Bearing Restrictions: Yes RLE Weight Bearing: Partial weight bearing RLE Partial Weight Bearing Percentage or Pounds: 50  Pain:  Pt denies pain this afternoon   Therapy/Group: Individual Therapy  [name] 04/04/2019, 2:39 PM

## 2019-04-05 ENCOUNTER — Inpatient Hospital Stay (HOSPITAL_COMMUNITY): Payer: Medicare Other

## 2019-04-05 ENCOUNTER — Ambulatory Visit: Payer: Medicare Other | Admitting: Podiatry

## 2019-04-05 ENCOUNTER — Inpatient Hospital Stay (HOSPITAL_COMMUNITY): Payer: Medicare Other | Admitting: Occupational Therapy

## 2019-04-05 LAB — CBC WITH DIFFERENTIAL/PLATELET
Abs Immature Granulocytes: 0.11 10*3/uL — ABNORMAL HIGH (ref 0.00–0.07)
Basophils Absolute: 0.1 10*3/uL (ref 0.0–0.1)
Basophils Relative: 0 %
Eosinophils Absolute: 0.3 10*3/uL (ref 0.0–0.5)
Eosinophils Relative: 1 %
HCT: 32.3 % — ABNORMAL LOW (ref 39.0–52.0)
Hemoglobin: 10.4 g/dL — ABNORMAL LOW (ref 13.0–17.0)
Immature Granulocytes: 0 %
Lymphocytes Relative: 78 %
Lymphs Abs: 20.5 10*3/uL — ABNORMAL HIGH (ref 0.7–4.0)
MCH: 33.5 pg (ref 26.0–34.0)
MCHC: 32.2 g/dL (ref 30.0–36.0)
MCV: 104.2 fL — ABNORMAL HIGH (ref 80.0–100.0)
Monocytes Absolute: 2.9 10*3/uL — ABNORMAL HIGH (ref 0.1–1.0)
Monocytes Relative: 11 %
Neutro Abs: 2.7 10*3/uL (ref 1.7–7.7)
Neutrophils Relative %: 10 %
Platelets: 304 10*3/uL (ref 150–400)
RBC: 3.1 MIL/uL — ABNORMAL LOW (ref 4.22–5.81)
RDW: 20.4 % — ABNORMAL HIGH (ref 11.5–15.5)
WBC: 26.5 10*3/uL — ABNORMAL HIGH (ref 4.0–10.5)
nRBC: 0 % (ref 0.0–0.2)

## 2019-04-05 LAB — PROTIME-INR
INR: 2.4 — ABNORMAL HIGH (ref 0.8–1.2)
Prothrombin Time: 26.1 seconds — ABNORMAL HIGH (ref 11.4–15.2)

## 2019-04-05 LAB — BASIC METABOLIC PANEL
Anion gap: 7 (ref 5–15)
BUN: 16 mg/dL (ref 8–23)
CO2: 27 mmol/L (ref 22–32)
Calcium: 8.7 mg/dL — ABNORMAL LOW (ref 8.9–10.3)
Chloride: 104 mmol/L (ref 98–111)
Creatinine, Ser: 0.87 mg/dL (ref 0.61–1.24)
GFR calc Af Amer: >60 mL/min (ref >60)
GFR calc non Af Amer: >60 mL/min (ref >60)
Glucose, Bld: 99 mg/dL (ref 70–99)
Potassium: 4.2 mmol/L (ref 3.5–5.1)
Sodium: 138 mmol/L (ref 135–145)

## 2019-04-05 LAB — GLUCOSE, CAPILLARY
Glucose-Capillary: 86 mg/dL (ref 70–99)
Glucose-Capillary: 71 mg/dL (ref 70–99)
Glucose-Capillary: 174 mg/dL — ABNORMAL HIGH (ref 70–99)
Glucose-Capillary: 163 mg/dL — ABNORMAL HIGH (ref 70–99)

## 2019-04-05 NOTE — Progress Notes (Signed)
Physical Therapy Session Note  Patient Details  Name: Oscar Baker MRN: [id number] Date of Birth: 1932/12/14  Today's Date: 04/05/2019 PT Individual Time:  -      Short Term Goals: Week 3:  PT Short Term Goal 1 (Week 3): STG=LTG due to ELOS.  Skilled Therapeutic Interventions/Progress Updates:     Patient in recliner in room upon PT arrival. Patient alert and agreeable to PT session. Patient denied pain throughout session, except as noted below.  Therapeutic Activity: Transfers: Patient performed stand pivot x1 and sit to/from stand x1 from the recliner, x2 from the w/c, x1 from the ADL recliner and x1 from the ADL sofa with supervision and increased time from lower surfaces using the RW. Provided verbal cues for pushing up from stable surfaces/arm rest from lower surfaces rather than holding the RW with his R hand.  Gait Training:  Patient ambulated 25 feet using the RW into and through the ADL apartment to simulate home environment with distant supervision and 140 feet using RW with close supervision. Ambulated with step-to gait pattern leading with R, forward trunk lean, decreased weight shift to R, and decreased stance time of R. Provided verbal cues for erect posture, staying close to the RW, and maintaining sequencing, leading with R, during turns to maintain PWB on R.  Wheelchair Mobility:  Patient propelled wheelchair 140 feet with supervision. Provided verbal cues for locking breaks x1.  Therapeutic Exercise: Patient performed the following exercises with verbal and tactile cues for proper technique. Educated on performing exercises on his own in the room 2x outside of therapies.  -R hip abduction in supine, with recliner leaned back and LEs elevated, x10 with gait belt for self assist -R heel slides x10, with recliner leaned back and LEs elevated, with gait belt for self assist -B LAQ x15 -B marching x10 -B seated hip external rotation x10  Patient in recliner with his  wife in the room at end of session with breaks locked and all needs within reach.    Therapy Documentation Precautions:  Precautions Precautions: Fall Restrictions Weight Bearing Restrictions: Yes RLE Weight Bearing: Partial weight bearing RLE Partial Weight Bearing Percentage or Pounds: 50%    Therapy/Group: Individual Therapy  Amarrah Meinhart L Srihitha Tagliaferri PT, DPT  04/05/2019, 8:57 AM

## 2019-04-05 NOTE — Progress Notes (Signed)
Occupational Therapy Session Note  Patient Details  Name: Oscar Baker MRN: [id number] Date of Birth: [birth year]  Today's Date: 04/05/2019 OT Individual Time: [id number] OT Individual Time Calculation (min): 40 min    Short Term Goals: Week 3:  OT Short Term Goal 1 (Week 3): STG=LTG secondary to ELOS  Skilled Therapeutic Interventions/Progress Updates:    OT intervention with focus on functional amb with RW, standing balance, BADL training including bahting at shower level, dressing with sit<>stand, and toileting, activity tolerance and safety awareness. Pt amb with RW to bathroom and completed shower with sit<>stand from bench.  Pt required CGA when standing to bathe buttocks.  Pt uses long handle sponge appropriately.  Pt requested use of toilet and stood at toilet to urinate.  Pt required assistance with donning incontinence brief. Pt returned to room and completed dressing tasks with sit<>stand from recliner-supervision level.  Pt completed grooming tasks seated in recliner.  Pt requires more than a reasonable amount of time to complete all tasks. Pt remained in recliner with all needs within reach.   Therapy Documentation Precautions:  Precautions Precautions: Fall Restrictions Weight Bearing Restrictions: Yes RLE Weight Bearing: Partial weight bearing RLE Partial Weight Bearing Percentage or Pounds: 50   Pain:  Pt denies pain this morning  Therapy/Group: Individual Therapy  [name] 04/05/2019, 9:03 AM

## 2019-04-05 NOTE — Patient Care Conference (Signed)
Inpatient RehabilitationTeam Conference and Plan of Care Update Date: 04/05/2019   Time: 10:17 AM    Patient Name: Oscar Baker      Medical Record Number: [id number]  Date of Birth: [birth year] Sex: Male         Room/Bed: 4M08C/4M08C-01 Payor Info: Payor: MEDICARE / Plan: MEDICARE PART A AND B / Product Type: *No Product type* /    Admit Date/Time:  03/20/2019  4:16 PM  Primary Diagnosis:  Periprosthetic fracture around internal prosthetic right hip joint [facility])  Patient Active Problem List   Diagnosis Date Noted  . Localized edema   . Serum potassium elevated   . Drug induced constipation   . Hypoglycemia   . Uncontrolled type 2 diabetes mellitus with hyperglycemia ([place])   . Acute blood loss anemia   . Periprosthetic fracture around internal prosthetic right hip joint ([place]) 03/20/2019  . Closed right hip fracture, initial encounter ([place]) 03/10/2019  . Chronic bronchitis with productive mucopurulent cough ([place]) 08/22/2018  . Acute systolic heart failure ([place]) 08/02/2018  . Type 2 diabetes mellitus ([place]) 08/02/2018  . Pain in joint of right shoulder 06/08/2018  . CAD (coronary artery disease) 05/21/2018  . Asthma exacerbation 05/21/2018  . Aspiration pneumonia ([place]) 05/21/2018  . Elevated INR   . Shoulder strain, right, initial encounter   . Acute respiratory failure with hypoxia ([place]) 05/20/2018  . Bilateral impacted cerumen 02/14/2018  . Asymmetric SNHL (sensorineural hearing loss) 02/14/2018  . Encounter for therapeutic drug monitoring 12/20/2017  . S/P thoracotomy 11/29/2017  . Sepsis ([place])   . Hypoxemia   . Pneumonia of left lower lobe due to infectious organism   . Mediastinal lymphadenopathy   . Chronic cough 11/23/2017  . CLL (chronic lymphocytic leukemia) ([place]) 11/23/2017  . Pleural effusion 11/23/2017  . HCAP (healthcare-associated pneumonia) 11/20/2017  . Anemia 11/20/2017  . Mixed conductive and sensorineural hearing loss of right ear with restricted hearing  of left ear 09/14/2017  . Abnormal hearing test 09/06/2017  . Cellulitis 09/06/2017  . Lymphadenitis, acute 09/06/2017  . Ischemic cardiomyopathy 09/01/2017  . S/p bilateral revision of total hip arthroplasty 08/11/2017  . History of revision of total replacement of left hip joint 08/11/2017  . Right hip pain   . Acute otitis media   . Chronic systolic CHF (congestive heart failure) ([place])   . Pressure injury of skin 07/20/2017  . Pressure ulcer 07/20/2017  . Malignant otitis externa 07/19/2017  . Fall in home 07/19/2017  . Acute on chronic diastolic heart failure ([place]) 07/19/2017  . S/P left THA, AA 05/12/2016  . History of repair of hip joint 05/12/2016  . Laryngopharyngeal reflux 05/21/2015  . Allergic rhinitis 05/21/2015  . Obesity 12/12/2014  . Upper airway cough syndrome 12/11/2014  . Posterior rhinorrhea 12/11/2014  . Cough variant asthma 07/27/2014  . Wheezing 06/20/2014  . Symptomatic bradycardia - s/p Biotronik (serial number [id number]) 02/08/2014  . Bradycardia 02/08/2014  . Diabetes mellitus type 2, noninsulin dependent ([place])   . Mixed incontinence 03/09/2013  . Edema of foot 03/06/2013  . Traumatic ecchymosis of right foot 03/06/2013  . Contusion of foot 03/06/2013  . Malignant neoplasm of prostate ([place]) 02/15/2013  . Diabetes mellitus without complication ([place]) 0000000  . Bone spur 12/30/2012  . Pain of toe of left foot 12/30/2012  . Hyperlipidemia 07/13/2012  . Hypertensive disorder 04/12/2012  . Sick sinus syndrome ([place]) 08/17/2011  . PAF (paroxysmal atrial fibrillation) ([place]) 09/16/2010    Expected Discharge Date: Expected Discharge Date:  04/11/19  Team Members Present: Physician leading conference: Dr. Delice Lesch Social Worker Present: Ovidio Kin, LCSW Nurse Present: Rayetta Pigg, RN PT Present: Apolinar Junes, PT OT Present: Willeen Cass, OT;Roanna Epley, COTA SLP Present: Jettie Booze, CF-SLP PPS Coordinator present : Gunnar Fusi,  SLP     Current Status/Progress Goal Weekly Team Focus  Bowel/Bladder   continent of b/b with urgency causing incontiences at timesl LBM: 10/13  remain continent of b/b  assist with tolieting needs prn   Swallow/Nutrition/ Hydration             ADL's   LB bathing/dressing-CGA; functional transfers-CGA/supervision; standing balance-CGA/supervision; TTB transfers-supervision; dynamic standing balance-CGA  supervision overall  ADL training; standing balance, safety awareness, educaiton, activity tolerance   Mobility   Supervision overall  Currently supervision, upgrading bed mobility, transfers, and short household ambulation to mod I for decreased caregiver burden at d/c.  Strength/ROM, balance, functional mobility, adhearing to weight bearing restrictions, d/c planning, activity tolerance, patient/caregiver education.   Communication             Safety/Cognition/ Behavioral Observations            Pain   c/o pain to RLE; scheduled tramadol  pain level <3/10  assess pain QS and prn   Skin   R hip incision with foam; stage II on coccyx, foam  remain free of new skin breakdown/infection  assess skin QS and prn    Rehab Goals Patient on target to meet rehab goals: Yes *See Care Plan and progress notes for long and short-term goals.     Barriers to Discharge  Current Status/Progress Possible Resolutions Date Resolved   Nursing                  PT                    OT                  SLP                SW                Discharge Planning/Teaching Needs:  Wife has been in for observation in therapies anf eels now she will be able to manage him at discharge from [place]. She will do hands on once team feels appropriate. Both pleased with his progress this week      Team Discussion:  Making good progress this week. Currently at min assist level. Goals of supervision PT upgrading to mod/i level. OT focusing on tolieting. Medically adjusting insulin and CBG's improved. Wife is  here to observe in therapies.   Revisions to Treatment Plan:  DC 10/20    Medical Summary Current Status: progressing- still intemittent constipation- taking prune juice- refusing other bowel meds currently Weekly Focus/Goal: constipation  Barriers to Discharge: Behavior;Home enviroment access/layout;Decreased family/caregiver support;Medication compliance;Other (comments)  Barriers to Discharge Comments: constipation Possible Resolutions to Barriers: get constpiation resolved   Continued Need for Acute Rehabilitation Level of Care: The patient requires daily medical management by a physician with specialized training in physical medicine and rehabilitation for the following reasons: Direction of a multidisciplinary physical rehabilitation program to maximize functional independence : Yes Medical management of patient stability for increased activity during participation in an intensive rehabilitation regime.: Yes Analysis of laboratory values and/or radiology reports with any subsequent need for medication adjustment and/or medical intervention. : Yes   I attest that I was  present, lead the team conference, and concur with the assessment and plan of the team. Team conference was held via web/ teleconference due to [place]    Fenris Cauble, Gard[place] Rhyme 04/06/2019, 8:54 AM

## 2019-04-05 NOTE — Plan of Care (Signed)
  Problem: RH BOWEL ELIMINATION Goal: RH STG MANAGE BOWEL WITH ASSISTANCE Description: STG Manage Bowel with min Assistance. Outcome: Progressing Goal: RH STG MANAGE BOWEL W/MEDICATION W/ASSISTANCE Description: STG Manage Bowel with Medication with min Assistance. Outcome: Progressing   Problem: RH SKIN INTEGRITY Goal: RH STG SKIN FREE OF INFECTION/BREAKDOWN Description: Patients skin will remain free from further infection or breakdown with mod assist. Outcome: Progressing Goal: RH STG MAINTAIN SKIN INTEGRITY WITH ASSISTANCE Description: STG Maintain Skin Integrity With mod Assistance. Outcome: Progressing Goal: RH STG ABLE TO PERFORM INCISION/WOUND CARE W/ASSISTANCE Description: STG Able To Perform Incision/Wound Care With total Assistance from caregiver. Outcome: Progressing   Problem: RH SAFETY Goal: RH STG ADHERE TO SAFETY PRECAUTIONS W/ASSISTANCE/DEVICE Description: STG Adhere to Safety Precautions With supervision Assistance/Device. Outcome: Progressing   Problem: RH PAIN MANAGEMENT Goal: RH STG PAIN MANAGED AT OR BELOW PT'S PAIN GOAL Description: < 4 Outcome: Progressing

## 2019-04-05 NOTE — Progress Notes (Signed)
Social Work Patient ID: Oscar Baker, male   DOB: 1932/09/15, 85 y.o.   MRN: [id number]  Met with pt and wife to discuss team conference progress toward his goals and goals being upgraded to mod/i-PT goals. Both very pleased with his progress and feel will be ready to go home on Tuesday, 10/20. Discussed home health and equipment will work on needs.

## 2019-04-05 NOTE — Progress Notes (Signed)
Occupational Therapy Session Note  Patient Details  Name: Oscar Baker MRN: [id number] Date of Birth: 21-Jun-1933  Today's Date: 04/05/2019 OT Individual Time: 1016-1100 OT Individual Time Calculation (min): 44 min    Short Term Goals: Week 3:  OT Short Term Goal 1 (Week 3): STG=LTG secondary to ELOS  Skilled Therapeutic Interventions/Progress Updates:    Began session with pt working on tub/shower transfers with min assist using the RW and tub bench.  He is able to self manage his RLE over the edge of the tub, both placing it in as well as lifting it out, with use of a gait belt to help him lift it secondary to not having enough strength to complete it without self assist.  Next, took pt to the therapy gym for work on UE strengthening and endurance.  He completed 4 sets with use of the UE ergonometer on level 8 resistance and RPMs maintained at 25-30.  He completed 3 intervals of 5 mins, 5 mins, and 3 mins peddling forward and then 1 for 3 mins peddling reverse.  Oxygen sats 97% with HR at 84 after each set.  He was then transferred back to the room where he completed use of the toilet in standing with min guard assist and use of the RW for support.  Finished session with pt in the bedside recliner with call button and phone in reach.    Therapy Documentation Precautions:  Precautions Precautions: Fall Restrictions Weight Bearing Restrictions: Yes RLE Weight Bearing: Partial weight bearing RLE Partial Weight Bearing Percentage or Pounds: 50  Pain: Pain Assessment Pain Scale: Faces Pain Score: 0-No pain  Therapy/Group: Individual Therapy  Agnieszka Newhouse OTR/L 04/05/2019, 11:08 AM

## 2019-04-05 NOTE — Plan of Care (Signed)
  Problem: RH Balance Goal: LTG Patient will maintain dynamic standing balance (PT) Description: LTG:  Patient will maintain dynamic standing balance with assistance during mobility activities (PT) Flowsheets (Taken 04/05/2019 1209) LTG: Pt will maintain dynamic standing balance during mobility activities with:: (upgraded goal) Independent with assistive device  Note: Upgraded goal due to patient's progress with functional mobility/balance and for reduced caregiver burden at d/c.   Problem: RH Bed Mobility Goal: LTG Patient will perform bed mobility with assist (PT) Description: LTG: Patient will perform bed mobility with assistance, with/without cues (PT). Flowsheets (Taken 04/05/2019 1209) LTG: Pt will perform bed mobility with assistance level of: (upgraded goal, set up for use of gait belt to manage R LE) Set up assist  Note: Upgraded goal due to patient's progress with functional mobility/balance and for reduced caregiver burden at d/c.   Problem: RH Bed to Chair Transfers Goal: LTG Patient will perform bed/chair transfers w/assist (PT) Description: LTG: Patient will perform bed to chair transfers with assistance (PT). Flowsheets (Taken 04/05/2019 1209) LTG: Pt will perform Bed to Chair Transfers with assistance level: (upgraded goal) Independent with assistive device  Note: Upgraded goal due to patient's progress with functional mobility/balance and for reduced caregiver burden at d/c.   Problem: RH Ambulation Goal: LTG Patient will ambulate in home environment (PT) Description: LTG: Patient will ambulate in home environment, # of feet with assistance (PT). Flowsheets (Taken 04/05/2019 1209) LTG: Pt will ambulate in home environ  assist needed:: (upgraded goal) Independent with assistive device LTG: Ambulation distance in home environment: (doungraded distance due to upgrading to mod I with home ambulation.) 25' Note: Upgraded goal due to patient's progress with functional  mobility/balance and for reduced caregiver burden at d/c.

## 2019-04-05 NOTE — Progress Notes (Signed)
[place] for warfarin Indication: atrial fibrillation  Patient Measurements: Height: 5' 5.98" (167.6 cm) Weight: 201 lb 11.5 oz (91.5 kg) IBW/kg (Calculated) : 63.75  Vital Signs: Temp: 97.6 F (36.4 C) (10/14 0400) Temp Source: Oral (10/14 0400) BP: 118/61 (10/14 0400) Pulse Rate: 59 (10/14 0400)  Labs: Recent Labs    04/03/19 0722 04/05/19 0515  HGB 10.1* 10.4*  HCT 32.7* 32.3*  PLT 362 304  LABPROT 26.4* 26.1*  INR 2.5* 2.4*  CREATININE 0.85 0.87    Estimated Creatinine Clearance: 67 mL/min (by C-G formula based on SCr of 0.87 mg/dL).   Assessment: 55 YOM on warfarin for atrial fibrillation. Warfarin resumed on 9/23 s/p THA surgery. INR has been therapeutic and relatively stable.  PO intake has been stable per charting.  No bleeding reported, CBC stable.  Home regimen: 5mg  PO daily except 7.5mg  on Tues and Sat.    Goal of Therapy:  INR 2-3 Monitor platelets by anticoagulation protocol: Yes   Plan:  Continue Coumadin 5mg  PO daily at 1800 INR on MWF only   Thank you for involving pharmacy in this patient's care.  Horton Chin, PharmD, BCPS Clinical Pharmacist Clinical phone for 04/05/2019 until 3p is [phone] 04/05/2019 8:03 AM  **Pharmacist phone directory can be found on [email or web address] listed under [place]**

## 2019-04-05 NOTE — Progress Notes (Signed)
Occupational Therapy Session Note  Patient Details  Name: Oscar Baker MRN: [id number] Date of Birth: 12-Dec-1932  Today's Date: 04/05/2019 OT Individual Time: 1300-1345 OT Individual Time Calculation (min): 45 min    Short Term Goals: Week 3:  OT Short Term Goal 1 (Week 3): STG=LTG secondary to ELOS  Skilled Therapeutic Interventions/Progress Updates:    OT intervention with focus on bed mobility on ADL bed.  Pt practiced sit<>supine and repositioning X 4 at supervsion level.  Pt uses gait belt as assist lifting RLE on and off bed.  Pt performed sit<>stand X 4.  Pt requested to use toilet upon return to room.  Pt required assistance with incontinence brief but able to doff/don pants without assistance.  Discussed use of urinal at night when returning to home.  Pt in agreement. Pt returned to recliner.  All needs within reach. Wife present.   Therapy Documentation Precautions:  Precautions Precautions: Fall Restrictions Weight Bearing Restrictions: Yes RLE Weight Bearing: Partial weight bearing RLE Partial Weight Bearing Percentage or Pounds: 50 Pain:   Pt denies pain this afternoon.  Therapy/Group: Individual Therapy  [name] 04/05/2019, 2:34 PM

## 2019-04-05 NOTE — Progress Notes (Signed)
[facility] PHYSICAL MEDICINE & REHABILITATION PROGRESS NOTE   Subjective/Complaints:  Pt reports best BM this AM- just prune juice- prune juice x2 cups every AM.   ROS: Denies CP, SOB, N/V/D  Objective:   No results found. Recent Labs    04/03/19 0722 04/05/19 0515  WBC 27.3* 26.5*  HGB 10.1* 10.4*  HCT 32.7* 32.3*  PLT 362 304   Recent Labs    04/03/19 0722 04/05/19 0515  NA 137 138  K 4.5 4.2  CL 104 104  CO2 24 27  GLUCOSE 98 99  BUN 15 16  CREATININE 0.85 0.87  CALCIUM 8.6* 8.7*    Intake/Output Summary (Last 24 hours) at 04/05/2019 0856 Last data filed at 04/05/2019 0747 Gross per 24 hour  Intake 960 ml  Output 1100 ml  Net -140 ml     Physical Exam: Vital Signs Blood pressure 118/61, pulse (!) 59, temperature 97.6 F (36.4 C), temperature source Oral, resp. rate 18, height 5' 5.98" (1.676 m), weight 91.5 kg, SpO2 96 %.      Physical Exam Constitutional: No distress . Vital signs and labs reviewed. Sitting up in bedside chair; alone; bright affect, NAD HENT: Normocephalic.  Atraumatic. Eyes: EOMI.  Conjugate gaze Cardiovascular: .irregular- rate controlled Respiratory: Normal effort.  No stridor. CTA B/L after cleared/coughed which is chronic GI: Non-distended. Soft , NT, (+)BS Skin: Right hip with dressing C/D/I- incision looks great- almost healed- no drainage; no erythema; mild edema 2+ LE edema to mid calf B/L- pitting Psych: bright affect Musc: Right hip edema with tenderness. Neuro: Alert HOH Motor: Bilateral upper extremities: 5/5 proximal distal Left lower extremity: Hip flexion, knee extension 4-- 4/5, ankle dorsiflexion 5/5 Right lower extremity: Hip flexion, knee extension 2/5, ankle dorsiflexion 5/5  Assessment/Plan: 1. Functional deficits secondary to R periprosthetic hip fracture due to ground level fall and poor balance which require 3+ hours per day of interdisciplinary therapy in a comprehensive inpatient rehab  setting.  Physiatrist is providing close team supervision and 24 hour management of active medical problems listed below.  Physiatrist and rehab team continue to assess barriers to discharge/monitor patient progress toward functional and medical goals  Care Tool:  Bathing    Body parts bathed by patient: Right arm, Left arm, Chest, Abdomen, Front perineal area, Right upper leg, Left upper leg, Right lower leg, Left lower leg, Buttocks   Body parts bathed by helper: Buttocks     Bathing assist Assist Level: Contact Guard/Touching assist     Upper Body Dressing/Undressing Upper body dressing   What is the patient wearing?: Pull over shirt    Upper body assist Assist Level: Set up assist    Lower Body Dressing/Undressing Lower body dressing      What is the patient wearing?: Incontinence brief, Pants     Lower body assist Assist for lower body dressing: Minimal Assistance - Patient > 75%     Toileting Toileting    Toileting assist Assist for toileting: Minimal Assistance - Patient > 75%     Transfers Chair/bed transfer  Transfers assist     Chair/bed transfer assist level: Supervision/Verbal cueing Chair/bed transfer assistive device: Programmer, multimedia   Ambulation assist   Ambulation activity did not occur: Safety/medical concerns(increased pain/decreased activity tolerance)  Assist level: Supervision/Verbal cueing Assistive device: Walker-rolling Max distance: 160   Walk 10 feet activity   Assist  Walk 10 feet activity did not occur: Safety/medical concerns(increased pain/decreased activity tolerance)  Assist level: Supervision/Verbal cueing  Assistive device: Walker-rolling   Walk 50 feet activity   Assist Walk 50 feet with 2 turns activity did not occur: Safety/medical concerns(increased pain/decreased activity tolerance)  Assist level: Supervision/Verbal cueing Assistive device: Walker-rolling    Walk 150 feet  activity   Assist Walk 150 feet activity did not occur: Safety/medical concerns(increased pain/decreased activity tolerance)  Assist level: Supervision/Verbal cueing Assistive device: Walker-rolling    Walk 10 feet on uneven surface  activity   Assist Walk 10 feet on uneven surfaces activity did not occur: Safety/medical concerns(increased pain/decreased activity tolerance)         Wheelchair     Assist Will patient use wheelchair at discharge?: Yes Type of Wheelchair: Manual    Wheelchair assist level: Supervision/Verbal cueing Max wheelchair distance: 125'    Wheelchair 50 feet with 2 turns activity    Assist    Wheelchair 50 feet with 2 turns activity did not occur: Safety/medical concerns(increased pain/decreased activity tolerance)   Assist Level: Supervision/Verbal cueing   Wheelchair 150 feet activity     Assist  Wheelchair 150 feet activity did not occur: Safety/medical concerns(increased pain/decreased activity tolerance)       Blood pressure 118/61, pulse (!) 59, temperature 97.6 F (36.4 C), temperature source Oral, resp. rate 18, height 5' 5.98" (1.676 m), weight 91.5 kg, SpO2 96 %.    Medical Problem List and Plan: 1. R hip periprosthetic fracture s/p ORIF secondary to ground level fall at home due to poor balance. PWB on RLE    CIR PT, OT  2. Antithrombotics: -DVT/anticoagulation:Pharmaceutical:Coumadin  INR borderline therapeutic on 10/5- 2.1  10/14- INR 2.5- therapeutic -antiplatelet therapy: NA 3. Pain Management:  continue gabapentin tid for neuropathy and Norco prn   Tramadol as needed added on 10/4 4. Mood:LCSW to follow for evaluation and support. -antipsychotic agents: N/A 5. Neuropsych: This patient is capable of making decisions on his own behalf. 6. Skin/Wound Care:Monitor wound for healing. Continue nutritional supplement to promote healing. 7. Fluids/Electrolytes/Nutrition:Monitor  I/O.  8. Chronic A FIB: Monitor HR tid--continue coreg. Monitor for orthostatic symptoms.  Continue Coumadin 9. CLL: WBC trending back down- is normally in 20s.   WBCs 32.6 on 10/5  10/12- WBC down to 27k- finally close to his baseline  Cont to monitor 10 ABLA: Continue to monitor H/H--stabilizing around 8. On iron supplement.   10/12- Hb up to 10.1 11. T2DM with hyperglycemia: On lantus 35 units daily with SSI for elevated BS. Monitor BS ac/hs and titrate lantus as indicated.   Decrease Lantus to 36 units on 10/4  Decreased Lantus to 20 units on 10/6 (already received a.m. dose) due to hypoglycemia  Will ensure for bedtime snack CBG (last 3)  Recent Labs    04/04/19 1619 04/04/19 2049 04/05/19 0606  GLUCAP 168* 150* 86  will reduce Lantus to 17U  10/12- no hypoglycemic episodes with reduction in Lantus.   12.Hyponatremia: Resolved 13. Chronic cough (due to chronic aspiration): per records has refused dysphagia diet. On duonebs tid. On Neurontin for cough? Keep HOB>30 degrees  10/2-Duonebs TID since resp d/c'd AND made prn as well, so doesn't miss a dose with therapy. Will enc flutter valve use 14. Constipation due to opioids-  Scheduled Senokot S 2 tabs BID and use prns as necessary,  10/6- give Mg citrate and suppository/enema if no results- advised pt to stop refusing bowel meds when going.  10/7- had good BMs x2+- doing well  Cont prn meds 15. Poor balance  vestibular PT  16. Elevated K+  K+ 5.1 on 10/5  Labs ordered for tomorrow  10/6- K+ 4.4- much improved with no treatment  17.  Chronic incont related to bladder CA - f/u with urology as OP  18. LE edema  10/13- will add Lasix 10 mEq daily and give Lasix 40 mEq x1- will recheck in AM.   10/14- K+ 4.2- edema slightly improved  LOS: 16 days A FACE TO FACE EVALUATION WAS PERFORMED  Chastidy Ranker 04/05/2019, 8:56 AM

## 2019-04-06 ENCOUNTER — Inpatient Hospital Stay (HOSPITAL_COMMUNITY): Payer: Medicare Other

## 2019-04-06 LAB — GLUCOSE, CAPILLARY
Glucose-Capillary: 90 mg/dL (ref 70–99)
Glucose-Capillary: 89 mg/dL (ref 70–99)
Glucose-Capillary: 166 mg/dL — ABNORMAL HIGH (ref 70–99)
Glucose-Capillary: 166 mg/dL — ABNORMAL HIGH (ref 70–99)

## 2019-04-06 NOTE — Progress Notes (Signed)
[facility] PHYSICAL MEDICINE & REHABILITATION PROGRESS NOTE   Subjective/Complaints:  Pt reports LBM just now on toilet- leg swelling is much better- Look like "real legs".   Wife agreed with improvement in swelling.  ROS: Denies CP, SOB, N/V/D  Objective:   No results found. Recent Labs    04/05/19 0515  WBC 26.5*  HGB 10.4*  HCT 32.3*  PLT 304   Recent Labs    04/05/19 0515  NA 138  K 4.2  CL 104  CO2 27  GLUCOSE 99  BUN 16  CREATININE 0.87  CALCIUM 8.7*    Intake/Output Summary (Last 24 hours) at 04/06/2019 1109 Last data filed at 04/06/2019 0900 Gross per 24 hour  Intake 600 ml  Output 900 ml  Net -300 ml     Physical Exam: Vital Signs Blood pressure 112/65, pulse 60, temperature 98 F (36.7 C), temperature source Oral, resp. rate 18, height 5' 5.98" (1.676 m), weight 92.3 kg, SpO2 95 %.      Physical Exam Constitutional: No distress . Vital signs and labs reviewed. Sitting up in bedside chair; just got there by walking with RW< wife at bedside, NT assisted pt, NAD HENT: Normocephalic.  Atraumatic. Eyes: EOMI.  Conjugate gaze Cardiovascular: .irregular- rate controlled Respiratory: Normal effort.  No stridor. CTA B/L after cleared/coughed which is chronic- always clears in AM GI: Non-distended. Soft , NT, (+)BS Skin: Right hip with dressing C/D/I- incision looks great- almost healed- no drainage; no erythema; mild edema 1-2+ LE edema to mid calf B/L- pitting- improved greatly Psych: bright affect Musc: Right hip edema with tenderness. Neuro: Alert HOH Motor: Bilateral upper extremities: 5/5 proximal distal Left lower extremity: Hip flexion, knee extension 4-- 4/5, ankle dorsiflexion 5/5 Right lower extremity: Hip flexion, knee extension 2/5, ankle dorsiflexion 5/5  Assessment/Plan: 1. Functional deficits secondary to R periprosthetic hip fracture due to ground level fall and poor balance which require 3+ hours per day of interdisciplinary  therapy in a comprehensive inpatient rehab setting.  Physiatrist is providing close team supervision and 24 hour management of active medical problems listed below.  Physiatrist and rehab team continue to assess barriers to discharge/monitor patient progress toward functional and medical goals  Care Tool:  Bathing    Body parts bathed by patient: Right arm, Left arm, Chest, Abdomen, Front perineal area, Right upper leg, Left upper leg, Right lower leg, Left lower leg, Buttocks   Body parts bathed by helper: Buttocks     Bathing assist Assist Level: Contact Guard/Touching assist     Upper Body Dressing/Undressing Upper body dressing   What is the patient wearing?: Pull over shirt    Upper body assist Assist Level: Set up assist    Lower Body Dressing/Undressing Lower body dressing      What is the patient wearing?: Incontinence brief, Pants     Lower body assist Assist for lower body dressing: Minimal Assistance - Patient > 75%     Toileting Toileting    Toileting assist Assist for toileting: Contact Guard/Touching assist     Transfers Chair/bed transfer  Transfers assist     Chair/bed transfer assist level: Supervision/Verbal cueing Chair/bed transfer assistive device: Programmer, multimedia   Ambulation assist   Ambulation activity did not occur: Safety/medical concerns(increased pain/decreased activity tolerance)  Assist level: Supervision/Verbal cueing Assistive device: Walker-rolling Max distance: 140'   Walk 10 feet activity   Assist  Walk 10 feet activity did not occur: Safety/medical concerns(increased pain/decreased activity tolerance)  Assist level: Supervision/Verbal cueing Assistive device: Walker-rolling   Walk 50 feet activity   Assist Walk 50 feet with 2 turns activity did not occur: Safety/medical concerns(increased pain/decreased activity tolerance)  Assist level: Supervision/Verbal cueing Assistive device:  Walker-rolling    Walk 150 feet activity   Assist Walk 150 feet activity did not occur: Safety/medical concerns(increased pain/decreased activity tolerance)  Assist level: Supervision/Verbal cueing Assistive device: Walker-rolling    Walk 10 feet on uneven surface  activity   Assist Walk 10 feet on uneven surfaces activity did not occur: Safety/medical concerns(increased pain/decreased activity tolerance)         Wheelchair     Assist Will patient use wheelchair at discharge?: Yes Type of Wheelchair: Manual    Wheelchair assist level: Supervision/Verbal cueing Max wheelchair distance: 140'    Wheelchair 50 feet with 2 turns activity    Assist    Wheelchair 50 feet with 2 turns activity did not occur: Safety/medical concerns(increased pain/decreased activity tolerance)   Assist Level: Supervision/Verbal cueing   Wheelchair 150 feet activity     Assist  Wheelchair 150 feet activity did not occur: Safety/medical concerns(increased pain/decreased activity tolerance)       Blood pressure 112/65, pulse 60, temperature 98 F (36.7 C), temperature source Oral, resp. rate 18, height 5' 5.98" (1.676 m), weight 92.3 kg, SpO2 95 %.    Medical Problem List and Plan: 1. R hip periprosthetic fracture s/p ORIF secondary to ground level fall at home due to poor balance. PWB on RLE    CIR PT, OT  2. Antithrombotics: -DVT/anticoagulation:Pharmaceutical:Coumadin  INR borderline therapeutic on 10/5- 2.1  10/14- INR 2.5- therapeutic -antiplatelet therapy: NA 3. Pain Management:  continue gabapentin tid for neuropathy and Norco prn   Tramadol as needed added on 10/4 4. Mood:LCSW to follow for evaluation and support. -antipsychotic agents: N/A 5. Neuropsych: This patient is capable of making decisions on his own behalf. 6. Skin/Wound Care:Monitor wound for healing. Continue nutritional supplement to promote healing. 7.  Fluids/Electrolytes/Nutrition:Monitor I/O.  8. Chronic A FIB: Monitor HR tid--continue coreg. Monitor for orthostatic symptoms.  Continue Coumadin 9. CLL: WBC trending back down- is normally in 20s.   WBCs 32.6 on 10/5  10/12- WBC down to 27k- finally close to his baseline  Cont to monitor 10 ABLA: Continue to monitor H/H--stabilizing around 8. On iron supplement.   10/12- Hb up to 10.1 11. T2DM with hyperglycemia: On lantus 35 units daily with SSI for elevated BS. Monitor BS ac/hs and titrate lantus as indicated.   Decrease Lantus to 36 units on 10/4  Decreased Lantus to 20 units on 10/6 (already received a.m. dose) due to hypoglycemia  Will ensure for bedtime snack CBG (last 3)  Recent Labs    04/05/19 1628 04/05/19 2118 04/06/19 0624  GLUCAP 174* 163* 90  will reduce Lantus to 17U  10/12- no hypoglycemic episodes with reduction in Lantus.   12.Hyponatremia: Resolved 13. Chronic cough (due to chronic aspiration): per records has refused dysphagia diet. On duonebs tid. On Neurontin for cough? Keep HOB>30 degrees  10/2-Duonebs TID since resp d/c'd AND made prn as well, so doesn't miss a dose with therapy. Will enc flutter valve use 14. Constipation due to opioids-  Scheduled Senokot S 2 tabs BID and use prns as necessary,  10/6- give Mg citrate and suppository/enema if no results- advised pt to stop refusing bowel meds when going.  10/7- had good BMs x2+- doing well  Cont prn meds 15. Poor balance  vestibular PT  16. Elevated K+  K+ 5.1 on 10/5  Labs ordered for tomorrow  10/6- K+ 4.4- much improved with no treatment  17.  Chronic incont related to bladder CA - f/u with urology as OP  18. LE edema  10/13- will add Lasix 10 mEq daily and give Lasix 40 mEq x1- will recheck in AM.   10/14- K+ 4.2- edema slightly improved  10/15- Edema much improved- voided a lot yesterday.   - might go back to home dose of 20 mEq, but will monitor til Monday  LOS: 17 days A  FACE TO FACE EVALUATION WAS PERFORMED  [place] 04/06/2019, 11:09 AM

## 2019-04-06 NOTE — Progress Notes (Signed)
Occupational Therapy Session Note  Patient Details  Name: OLTON PREJEAN MRN: [id number] Date of Birth: 1933-04-01  Today's Date: 04/06/2019 OT Individual Time: 0815-0900 OT Individual Time Calculation (min): 45 min    Short Term Goals: Week 3:  OT Short Term Goal 1 (Week 3): STG=LTG secondary to ELOS  Skilled Therapeutic Interventions/Progress Updates:    Pt resting in recliner upon arrival with wife present. Pt requested to use toilet and amb with RW to bathroom (supervision) and managed clothing without assistance.  Pt required assistance with hygiene.  Pt amb with RW in hall 120' and returned to recliner.  Pt remained in recliner with all needs within reach and wife present.   Therapy Documentation Precautions:  Precautions Precautions: Fall Restrictions Weight Bearing Restrictions: Yes RLE Weight Bearing: Partial weight bearing RLE Partial Weight Bearing Percentage or Pounds: 50 Pain: Pain Assessment Pain Scale: 0-10 Pain Score: 0-No pain   Therapy/Group: Individual Therapy  [name] 04/06/2019, 9:57 AM

## 2019-04-06 NOTE — Progress Notes (Signed)
Physical Therapy Session Note  Patient Details  Name: Oscar Baker MRN: [id number] Date of Birth: March 13, 1933  Today's Date: 04/06/2019 PT Individual Time: 1010-1045 and 1250-1345 PT Individual Time Calculation (min): 35 min and 55 min   Short Term Goals: Week 3:  PT Short Term Goal 1 (Week 3): STG=LTG due to ELOS.  Skilled Therapeutic Interventions/Progress Updates:     Session 1: Patient in recliner with his wife in the room upon PT arrival. Patient alert and agreeable to PT session. Patient reported 4/10 L hip pain during session, RN made aware. PT provided repositioning, rest breaks, and distraction as pain interventions throughout session.   Therapeutic Activity: Transfers: Patient performed sit to/from stand x1 and a car tranfser x1 set to 19" height to simulate a [employer] with supervision. Provided verbal cues for hand placement during car transfer.  Gait Training:  Patient ambulated 60 feet using RW with supervision. Ambulated with step-to gait pattern leading with R with increased forward trunk lean. Provided verbal cues for erect posture.  Wheelchair Mobility:  Patient was transported in the w/c with total A throughout session for energy conservation and time management.  Patient in recliner with his wife in the room at end of session with breaks locked and all needs within reach. Discussed d/c plan, HHPT, and goals for d/c with patient and his wife at end of session.   Session 2: Patient in recliner in room finishing lunch with his wife leaving upon PT arrival. Patient alert and agreeable to PT session. Patient denied pain throughout session. Transported patient outside for community mobility training during session.   Therapeutic Activity: Transfers: Patient performed sit to/from stand x6 and stand pivot x2 with supervision for safety using a RW. Demonstrated correct hand position and foot placement to reduce WB on R LE each trial with cues to reach back to sit x1.    Gait Training:  Patient ambulated 120 feet on paved unlevel surfaces outside, 60 feet up down a paved hill outside, up/down a curb x3 leading with L up and R down with increased UE use to off-weight R LE for PWB, and ambulated crossing in front of a mildly busy parking deck in front of traffic to simulate community ambulation using RW with CGA for safety for all ambulation. Ambulated as above. Provided verbal cues for lifting RW over unlevel surfaces and sequencing with demonstration for curb management.  Wheelchair Mobility:  Patient was transported in the w/c with total A throughout session for energy conservation and time management.  Patient in recliner in the room at end of session with breaks locked and all needs within reach. Patient was very pleased with this session and stated he felt very confident in going home next week after this session. PT educated on energy conservation techniques and strategies for safe mobility in the community, patient was very receptive.   Therapy Documentation Precautions:  Precautions Precautions: Fall Restrictions Weight Bearing Restrictions: Yes RLE Weight Bearing: Partial weight bearing RLE Partial Weight Bearing Percentage or Pounds: 50    Therapy/Group: Individual Therapy  Vian Fluegel L Barack Nicodemus PT, DPT  04/06/2019, 4:13 PM

## 2019-04-06 NOTE — Plan of Care (Signed)
  Problem: RH Wheelchair Mobility Goal: LTG Patient will propel w/c in controlled environment (PT) Description: LTG: Patient will propel wheelchair in controlled environment, # of feet with assist (PT) Outcome: Not Applicable Flowsheets (Taken 04/06/2019 1611) LTG: Pt will propel w/c in controlled environ  assist needed:: (d/c goal) -- Note: D/c goal due to patient progressing to a functional ambulator and not needing a w/c at d/c. Goal: LTG Patient will propel w/c in home environment (PT) Description: LTG: Patient will propel wheelchair in home environment, # of feet with assistance (PT). Outcome: Not Applicable Flowsheets (Taken 04/06/2019 1611) LTG: Pt will propel w/c in home environ  assist needed:: (d/c goal) -- Note: D/c goal due to patient progressing to a functional ambulator and not needing a w/c at d/c. Goal: LTG Patient will propel w/c in community environment (PT) Description: LTG: Patient will propel wheelchair in community environment, # of feet with assist (PT) Outcome: Not Applicable Flowsheets (Taken 04/06/2019 1611) LTG: Pt will propel w/c in community environ  assist needed:: (d/c goal) -- Note: D/c goal due to patient progressing to a functional ambulator and not needing a w/c at d/c.

## 2019-04-07 ENCOUNTER — Inpatient Hospital Stay (HOSPITAL_COMMUNITY): Payer: Medicare Other

## 2019-04-07 LAB — GLUCOSE, CAPILLARY
Glucose-Capillary: 71 mg/dL (ref 70–99)
Glucose-Capillary: 97 mg/dL (ref 70–99)
Glucose-Capillary: 131 mg/dL — ABNORMAL HIGH (ref 70–99)
Glucose-Capillary: 168 mg/dL — ABNORMAL HIGH (ref 70–99)

## 2019-04-07 LAB — PROTIME-INR
INR: 2.6 — ABNORMAL HIGH (ref 0.8–1.2)
Prothrombin Time: 27.7 seconds — ABNORMAL HIGH (ref 11.4–15.2)

## 2019-04-07 MED ORDER — IPRATROPIUM-ALBUTEROL 0.5-2.5 (3) MG/3ML IN SOLN
3.0000 mL | Freq: Four times a day (QID) | RESPIRATORY_TRACT | Status: DC
  Administered 2019-04-07: 3 mL via RESPIRATORY_TRACT

## 2019-04-07 MED ORDER — FUROSEMIDE 20 MG PO TABS
20.0000 mg | ORAL_TABLET | Freq: Every day | ORAL | Status: DC
  Administered 2019-04-08 – 2019-04-11 (×4): 20 mg via ORAL
  Filled 2019-04-07 (×4): qty 1

## 2019-04-07 MED ORDER — IPRATROPIUM-ALBUTEROL 0.5-2.5 (3) MG/3ML IN SOLN
3.0000 mL | Freq: Three times a day (TID) | RESPIRATORY_TRACT | Status: DC
  Administered 2019-04-07 – 2019-04-10 (×8): 3 mL via RESPIRATORY_TRACT
  Filled 2019-04-07 (×8): qty 3

## 2019-04-07 MED ORDER — METFORMIN HCL 850 MG PO TABS
850.0000 mg | ORAL_TABLET | Freq: Two times a day (BID) | ORAL | Status: DC
  Administered 2019-04-07 – 2019-04-11 (×8): 850 mg via ORAL
  Filled 2019-04-07 (×8): qty 1

## 2019-04-07 MED ORDER — IPRATROPIUM-ALBUTEROL 0.5-2.5 (3) MG/3ML IN SOLN: 3.0000 mL | Freq: Three times a day (TID) | RESPIRATORY_TRACT | Status: DC

## 2019-04-07 MED ORDER — IPRATROPIUM-ALBUTEROL 0.5-2.5 (3) MG/3ML IN SOLN: 3.0000 mL | Freq: Four times a day (QID) | RESPIRATORY_TRACT | Status: DC

## 2019-04-07 NOTE — Progress Notes (Signed)
Physical Therapy Session Note  Patient Details  Name: Oscar Baker MRN: [id number] Date of Birth: [birth year]  Today's Date: 04/07/2019 PT Individual Time: 0815-0915 PT Individual Time Calculation (min): 60 min   Short Term Goals: Week 2:  PT Short Term Goal 1 (Week 2): Patient will perform bed mobility with min A. PT Short Term Goal 1 - Progress (Week 2): Met PT Short Term Goal 2 (Week 2): Patient will perform basic transfers with min A of 1 person. PT Short Term Goal 3 (Week 2): Patient will ambulate >25 feet with CGA using LRAD while maintaining R PWB. PT Short Term Goal 3 - Progress (Week 2): Met PT Short Term Goal 4 (Week 2): Patient will propel w/c 100 ft. PT Short Term Goal 4 - Progress (Week 2): Met Week 3:  PT Short Term Goal 1 (Week 3): STG=LTG due to ELOS.  Skilled Therapeutic Interventions/Progress Updates:    Session focused on family education with pt's wife, Opal Sidles, present. Pt performed functional gait into bathroom to change brief and urinate standing at the toilet at overall supervision level using RW. Pt able to maintain balance during hygiene (performed himself) with supervision. Assist with donning of brief needed. Pt able to gait back to recliner to complete dressing at supervision level. Pt able to don pants and shirt from recliner with supervision to stand to pull up pants. PT donned Tedhose. Pt's wife request to work on car transfer, tub bench transfer, and bed mobility in real bed (in ADL apartment). Performed supervision transfer to [place] with RW with pt able to perform correct sequence without cues and educated wife on positioning for safety. In ADL apartment, performed transfer to tub bench with RW, recliner to simulate home recliner, and regular bed access. Pt able to perform all at supervision level. Discussed importance of allowing patient to do as much as possible before intervening. Also discussed possible use of transport w/c for community outings. They were  discussing using the w/c the condo unit has for transport out to the parking lot, upstairs, etc but recommended looking into transport chair as it is much lighter and pt's wife more likely to manage as pt able to walk longer distances but not community distances and they could use it out in the community as well. Both in agreement and notified CSW.   Therapy Documentation Precautions:  Precautions Precautions: Fall Restrictions Weight Bearing Restrictions: Yes RLE Weight Bearing: Partial weight bearing RLE Partial Weight Bearing Percentage or Pounds: 50 Pain:  Denies pain.    Therapy/Group: Individual Therapy  Canary Brim Ivory Broad, PT, DPT, CBIS  04/07/2019, 11:06 AM

## 2019-04-07 NOTE — Progress Notes (Signed)
Occupational Therapy Session Note  Patient Details  Name: Oscar Baker MRN: [id number] Date of Birth: 05-Jun-1933  Today's Date: 04/07/2019 OT Individual Time: 1400-1430 OT Individual Time Calculation (min): 30 min    Short Term Goals: Week 3:  OT Short Term Goal 1 (Week 3): STG=LTG secondary to ELOS  Skilled Therapeutic Interventions/Progress Updates:    OT intervention with focus on functional transfers, functional amb with RW, and toileting.  Pt amb with RW to bathroom and stood at toilet to void.  CGA for all toileting tasks.  Pt also assisted with changing incontinence brief at end of session while standing.  Pt amb in hallway 225' without rest breaks-supervisoin.  Pt stood at sink to wash hands after toileting.  Pt remained in w/c with all needs within reach.  Therapy Documentation Precautions:  Precautions Precautions: Fall Restrictions Weight Bearing Restrictions: Yes RLE Weight Bearing: Partial weight bearing RLE Partial Weight Bearing Percentage or Pounds: 50 Pain: Pain Assessment Pain Scale: 0-10 Pain Score: 0-No pain ATherapy/Group: Individual Therapy  [name] 04/07/2019, 2:50 PM

## 2019-04-07 NOTE — Progress Notes (Signed)
Occupational Therapy Session Note  Patient Details  Name: DARICK SAFER MRN: [id number] Date of Birth: [birth year]  Today's Date: 04/07/2019 OT Individual Time: [id number] Individual Time Calculation (min): 60 min    Short Term Goals: Week 3:  OT Short Term Goal 1 (Week 3): STG=LTG secondary to ELOS  Skilled Therapeutic Interventions/Progress Updates:    OT intervention with focus on functional amb with RW, standing balance, toileting, BADL training, activity tolerance, and safety awareness to increase independence with BADLs. Pt amb with RW to bathroom and completed bathing at shower level with sit<>stand using AE.  Pt amb with RW to toilet and completed toileting tasks with CGA. Pt returned to room and completed dressing tasks with sit<>stand.  Pt requires assistance with Ted hose. Pt amb with RW to sink and completed grooming tasks while standing.  Pt returned to recliner and remained seated in recliner with all needs within reach.  Wife present.  Therapy Documentation Precautions:  Precautions Precautions: Fall Restrictions Weight Bearing Restrictions: Yes RLE Weight Bearing: Partial weight bearing RLE Partial Weight Bearing Percentage or Pounds: 50 Pain:   Pt denies pain   Therapy/Group: Individual Therapy  [name] 04/07/2019, 10:50 AM

## 2019-04-07 NOTE — Progress Notes (Signed)
Social Work Patient ID: Oscar Baker, male   DOB: 08/27/1932, 85 y.o.   MRN: [id number]    Diagnosis codes: I50.22 & M97.01XA  Height:   5'7            Weight: 252 lbs           Patient suffers from R-Hip Fracture and CHF  which impairs their ability to perform daily activities like ADL's    in the home.  A walker  will not resolve issue with performing activities of daily living.  A wheelchair will allow patient to safely perform daily activities.  Patient has a caregiver who is able to push him in a transport chair.

## 2019-04-07 NOTE — Progress Notes (Signed)
ANTICOAGULATION CONSULT NOTE  Pharmacy Consult for warfarin Indication: atrial fibrillation  Patient Measurements: Height: 5' 5.98" (167.6 cm) Weight: 203 lb 7.8 oz (92.3 kg) IBW/kg (Calculated) : 63.75  Vital Signs: Temp: 97.7 F (36.5 C) (10/16 0353) Temp Source: Oral (10/16 0353) BP: 111/55 (10/16 0356) Pulse Rate: 60 (10/16 0356)  Labs: Recent Labs    04/05/19 0515 04/07/19 0515  HGB 10.4*  --   HCT 32.3*  --   PLT 304  --   LABPROT 26.1* 27.7*  INR 2.4* 2.6*  CREATININE 0.87  --     Estimated Creatinine Clearance: 67.2 mL/min (by C-G formula based on SCr of 0.87 mg/dL).   Assessment: 66 YOM on warfarin for atrial fibrillation. Warfarin resumed on 9/23 s/p THA surgery. INR has been therapeutic and relatively stable.  PO intake has been stable per charting.  No bleeding reported, CBC stable.  Home regimen: 5mg  PO daily except 7.5mg  on Tues and Sat.    Goal of Therapy:  INR 2-3 Monitor platelets by anticoagulation protocol: Yes   Plan:  Continue Coumadin 5mg  PO daily at 1800 INR on MWF only   Thank you for involving pharmacy in this patient's care.  Horton Chin, PharmD, BCPS Clinical Pharmacist Clinical phone for 04/07/2019 until 3p is [phone] 04/07/2019 8:33 AM  **Pharmacist phone directory can be found on [email or web address] listed under [place]**

## 2019-04-07 NOTE — Progress Notes (Signed)
[facility] PHYSICAL MEDICINE & REHABILITATION PROGRESS NOTE   Subjective/Complaints:  Pt reports legs look like real legs since yesterday. Best friend/like a brother had a stroke and downstairs last night- wife was there until midnight.  Says hasn't gotten Duonebs in 2 days- appears was cancelled by Resp therapy again. They keep stopping the meds when I wrote in order to not stop- pt had coughing fit all morning yesterday and starting again already this AM without Duonebs - was on home dose- but resp therapy keeps stopping.   ROS: Denies CP, SOB, N/V/D  Objective:   No results found. Recent Labs    04/05/19 0515  WBC 26.5*  HGB 10.4*  HCT 32.3*  PLT 304   Recent Labs    04/05/19 0515  NA 138  K 4.2  CL 104  CO2 27  GLUCOSE 99  BUN 16  CREATININE 0.87  CALCIUM 8.7*    Intake/Output Summary (Last 24 hours) at 04/07/2019 1323 Last data filed at 04/07/2019 0820 Gross per 24 hour  Intake 720 ml  Output 600 ml  Net 120 ml     Physical Exam: Vital Signs Blood pressure (!) 111/55, pulse 60, temperature 97.7 F (36.5 C), temperature source Oral, resp. rate 17, height 5' 5.98" (1.676 m), weight 92.3 kg, SpO2 96 %.      Physical Exam Constitutional: No distress . Vital signs and labs reviewed. Sitting up in bedside chair;wife in room, coughing like hacking up a lung intermittently, NAD  HENT: Normocephalic.  Atraumatic. Eyes: EOMI.  Conjugate gaze Cardiovascular: .irregular- rate controlled Respiratory: coughing like crazy- can't stop- sounds coarse with esp wheezes diffusely- even after large coughing fit, sounds like needs to bring up secretions- fair air movement B/L GI: Non-distended. Soft , NT, (+)BS Skin: Right hip with dressing C/D/I- incision looks great- almost healed- no drainage; no erythema; mild edema 1-2+ LE edema to mid calf B/L- pitting- improved greatly Psych: bright affect Musc: Right hip edema with tenderness. Neuro: Alert HOH Motor:  Bilateral upper extremities: 5/5 proximal distal Left lower extremity: Hip flexion, knee extension 4-- 4/5, ankle dorsiflexion 5/5 Right lower extremity: Hip flexion, knee extension 2/5, ankle dorsiflexion 5/5  Assessment/Plan: 1. Functional deficits secondary to R periprosthetic hip fracture due to ground level fall and poor balance which require 3+ hours per day of interdisciplinary therapy in a comprehensive inpatient rehab setting.  Physiatrist is providing close team supervision and 24 hour management of active medical problems listed below.  Physiatrist and rehab team continue to assess barriers to discharge/monitor patient progress toward functional and medical goals  Care Tool:  Bathing    Body parts bathed by patient: Right arm, Left arm, Chest, Abdomen, Front perineal area, Right upper leg, Left upper leg, Right lower leg, Left lower leg, Buttocks   Body parts bathed by helper: Buttocks     Bathing assist Assist Level: Supervision/Verbal cueing     Upper Body Dressing/Undressing Upper body dressing   What is the patient wearing?: Pull over shirt    Upper body assist Assist Level: Independent    Lower Body Dressing/Undressing Lower body dressing      What is the patient wearing?: Incontinence brief, Pants     Lower body assist Assist for lower body dressing: Minimal Assistance - Patient > 75%     Toileting Toileting    Toileting assist Assist for toileting: Contact Guard/Touching assist     Transfers Chair/bed transfer  Transfers assist     Chair/bed transfer assist level:  Supervision/Verbal cueing Chair/bed transfer assistive device: Programmer, multimedia   Ambulation assist   Ambulation activity did not occur: Safety/medical concerns(increased pain/decreased activity tolerance)  Assist level: Supervision/Verbal cueing Assistive device: Walker-rolling Max distance: 50'   Walk 10 feet activity   Assist  Walk 10 feet activity did  not occur: Safety/medical concerns(increased pain/decreased activity tolerance)  Assist level: Supervision/Verbal cueing Assistive device: Walker-rolling   Walk 50 feet activity   Assist Walk 50 feet with 2 turns activity did not occur: Safety/medical concerns(increased pain/decreased activity tolerance)  Assist level: Supervision/Verbal cueing Assistive device: Walker-rolling    Walk 150 feet activity   Assist Walk 150 feet activity did not occur: Safety/medical concerns(increased pain/decreased activity tolerance)  Assist level: Supervision/Verbal cueing Assistive device: Walker-rolling    Walk 10 feet on uneven surface  activity   Assist Walk 10 feet on uneven surfaces activity did not occur: Safety/medical concerns(increased pain/decreased activity tolerance)   Assist level: Contact Guard/Touching assist Assistive device: Aeronautical engineer Will patient use wheelchair at discharge?: Yes Type of Wheelchair: Manual    Wheelchair assist level: Supervision/Verbal cueing Max wheelchair distance: 140'    Wheelchair 50 feet with 2 turns activity    Assist    Wheelchair 50 feet with 2 turns activity did not occur: Safety/medical concerns(increased pain/decreased activity tolerance)   Assist Level: Supervision/Verbal cueing   Wheelchair 150 feet activity     Assist  Wheelchair 150 feet activity did not occur: Safety/medical concerns(increased pain/decreased activity tolerance)       Blood pressure (!) 111/55, pulse 60, temperature 97.7 F (36.5 C), temperature source Oral, resp. rate 17, height 5' 5.98" (1.676 m), weight 92.3 kg, SpO2 96 %.    Medical Problem List and Plan: 1. R hip periprosthetic fracture s/p ORIF secondary to ground level fall at home due to poor balance. PWB on RLE    CIR PT, OT  2. Antithrombotics: -DVT/anticoagulation:Pharmaceutical:Coumadin  INR borderline therapeutic on 10/5- 2.1  10/16- INR  2.6- therapeutic -antiplatelet therapy: NA 3. Pain Management:  continue gabapentin tid for neuropathy and Norco prn   Tramadol as needed added on 10/4 4. Mood:LCSW to follow for evaluation and support. -antipsychotic agents: N/A 5. Neuropsych: This patient is capable of making decisions on his own behalf. 6. Skin/Wound Care:Monitor wound for healing. Continue nutritional supplement to promote healing. 7. Fluids/Electrolytes/Nutrition:Monitor I/O.  8. Chronic A FIB: Monitor HR tid--continue coreg. Monitor for orthostatic symptoms.  Continue Coumadin 9. CLL: WBC trending back down- is normally in 20s.   WBCs 32.6 on 10/5  10/12- WBC down to 27k- finally close to his baseline  Cont to monitor 10 ABLA: Continue to monitor H/H--stabilizing around 8. On iron supplement.   10/12- Hb up to 10.1 11. T2DM with hyperglycemia: On lantus 35 units daily with SSI for elevated BS. Monitor BS ac/hs and titrate lantus as indicated.   Decrease Lantus to 36 units on 10/4  Decreased Lantus to 20 units on 10/6 (already received a.m. dose) due to hypoglycemia  Will ensure for bedtime snack CBG (last 3)  Recent Labs    04/06/19 2128 04/07/19 0613 04/07/19 1150  GLUCAP 166* 71 97  will reduce Lantus to 17U  10/12- no hypoglycemic episodes with reduction in Lantus.    10/16- Change back to home dose metformin- will do 850 mg BID and stop Lantus and monitor BGs 12.Hyponatremia: Resolved 13. Chronic cough (due to chronic aspiration): per records has refused dysphagia diet. On  duonebs tid. On Neurontin for cough? Keep HOB>30 degrees  10/2-Duonebs TID since resp d/c'd AND made prn as well, so doesn't miss a dose with therapy. Will enc flutter valve use  10/16- RESTARTED DUONEBS since resp therapy keeps stopping.  Will make home dose of TID   14. Constipation due to opioids-   Scheduled Senokot S 2 tabs BID and use prns as necessary,  10/6- give Mg citrate and  suppository/enema if no results- advised pt to stop refusing bowel meds when going.  10/7- had good BMs x2+- doing well  Cont prn meds 15. Poor balance            vestibular PT  16. Elevated K+  K+ 5.1 on 10/5  Labs ordered for tomorrow  10/6- K+ 4.4- much improved with no treatment  17.  Chronic incont related to bladder CA - f/u with urology as OP  18. LE edema  10/13- will add Lasix 10 mEq daily and give Lasix 40 mEq x1- will recheck in AM.   10/14- K+ 4.2- edema slightly improved  10/15- Edema much improved- voided a lot yesterday.   - might go back to home dose of 20 mEq, but will monitor til Monday  19. Dispo  Will need monitoring of Metformin/BGs since changed back and monitor that resp therapy doesn't stop Duonebs  LOS: 18 days A FACE TO FACE EVALUATION WAS PERFORMED  Oscar Baker 04/07/2019, 1:23 PM

## 2019-04-07 NOTE — Progress Notes (Signed)
Social Work Patient ID: Oscar Baker, male   DOB: 10-May-1933, 85 y.o.   MRN: [id number]  Wife here to do family education in preparation for discharge Tuesday. PT recommends a transport chair for them, since wife could not lift a wheelchair. Have made referral for a transport chair. Pt has other pieces of equipment and follow up arranged.

## 2019-04-08 ENCOUNTER — Inpatient Hospital Stay (HOSPITAL_COMMUNITY): Payer: Medicare Other

## 2019-04-08 DIAGNOSIS — R7309 Other abnormal glucose: Secondary | ICD-10-CM

## 2019-04-08 LAB — GLUCOSE, CAPILLARY
Glucose-Capillary: 99 mg/dL (ref 70–99)
Glucose-Capillary: 75 mg/dL (ref 70–99)
Glucose-Capillary: 163 mg/dL — ABNORMAL HIGH (ref 70–99)
Glucose-Capillary: 143 mg/dL — ABNORMAL HIGH (ref 70–99)

## 2019-04-08 NOTE — Progress Notes (Signed)
Physical Therapy Session Note  Patient Details  Name: Oscar Baker MRN: [id number] Date of Birth: 09-14-1932  Today's Date: 04/08/2019 PT Individual Time: 0900-1015 PT Individual Time Calculation (min): 75 min   Short Term Goals:  Week 3:  PT Short Term Goal 1 (Week 3): STG=LTG due to ELOS.      Skilled Therapeutic Interventions/Progress Updates:  Pt sitting up in recliner.  No c/o pain.  PT donned TEDS and non slip socks, and threaded shorts on feet and pulled up over knees.  Pt stood up with supervisoin, RW and completed shorts with supervision.  Stand pivot to mat with supervision.    Sustained stretch and balance challenge , standing with forefeet on wedge with bil UE support> 0UE support.  Pt able to sustain balance up to 19 seconds without UE support, and without LOB.   Standing at sturdy table with bil UE support, R hip neuro re-ed via demo, multimodal cues for abduction x 10 .    Standing with RW, kicking 6 small cones with R foot toward target 3' away, with accuracy 3/6.  Seated bil UE and upper back strengthening using shuttle on rope PT with cues for technique.  As pt increased in speed without cues, dual task was introduced, naming animals before sending shuttle.  Pt was unable to name an animal before sending shuttle 15/15 trials; increased time q trial to think of an animal. In standing, step/tap with R foot onto cone targets, accurately per color requested by PT, with 80% accuracy as difficulty increased to multiple colors and taps.  Seated RLE strengthening, drawing alphabet letters with R toes, in R knee extension.  PROM and self stretching RLE hamstrings and heel cords in sitting with foot stool.   Gait training to return to room, RW x 150' with supervision.  At end of session, pt resting in recliner with cues to elevate bil LEs.  Needs at hand and wife present. Pt asked to have diaper changed; PT notified Tonya, LPN.      Therapy Documentation Precautions:   Precautions Precautions: Fall Restrictions Weight Bearing Restrictions: Yes RLE Weight Bearing: Partial weight bearing RLE Partial Weight Bearing Percentage or Pounds: 50         Therapy/Group: Individual Therapy  Hall Birchard 04/08/2019, 10:30 AM

## 2019-04-08 NOTE — Progress Notes (Signed)
Occupational Therapy Session Note  Patient Details  Name: Oscar Baker MRN: [id number] Date of Birth: 1933-04-02  Today's Date: 04/08/2019 OT Individual Time: 1100-1200 OT Individual Time Calculation (min): 60 min    Short Term Goals: Week 1:  OT Short Term Goal 1 (Week 1): Pt will complete sit > stand as needed for LB self-care with mod assist of one caregiver OT Short Term Goal 1 - Progress (Week 1): Met OT Short Term Goal 2 (Week 1): Pt will complete bathing with mod assist of one caregiver OT Short Term Goal 2 - Progress (Week 1): Met OT Short Term Goal 3 (Week 1): Pt will complete LB dressing with mod assist of one caregiver with AE as needed OT Short Term Goal 3 - Progress (Week 1): Met OT Short Term Goal 4 (Week 1): Pt will complete toilet transfer with max assist of one caregiver OT Short Term Goal 4 - Progress (Week 1): Met  Skilled Therapeutic Interventions/Progress Updates:    1;1. Pt and wife present in room upon entering. Pt agreeable to OT but declines bathing this date. Pt changes shirt with setup. Pt very adamant he has no balance and will not get any back. OT discusses continuing to challenge and train balance and RW management skills to improve safety. Pt completes standing task declining standing in staggered stance with RLE forward during pipe tree activity with supervision. Pt able to notice and respond to 1 error made. Pt ambulates with RW with VC for decreasing shoulder elevation to ADL apartment for endurance. Pt transfers onto/off of low couch with S and completes kitchen search for in home mobility prep and dynamic reaching. Pt requries min VC for RW managemetnt throughout. Standing dynavision with RW and airex pad for balance training for 2 min with CGA for increased ant/posterior sway. Exited session with pt seated in bed, exit alarm on and call light in reach  Therapy Documentation Precautions:  Precautions Precautions: Fall Restrictions Weight Bearing  Restrictions: Yes RLE Weight Bearing: Partial weight bearing RLE Partial Weight Bearing Percentage or Pounds: 50 General:   Vital Signs: Oxygen Therapy SpO2: 98 % O2 Device: Room Air Pain:   ADL: ADL Grooming: Setup Where Assessed-Grooming: Sitting at sink Upper Body Bathing: Minimal assistance Where Assessed-Upper Body Bathing: Sitting at sink Lower Body Bathing: Maximal assistance Where Assessed-Lower Body Bathing: Sitting at sink Upper Body Dressing: Minimal assistance Where Assessed-Upper Body Dressing: Sitting at sink Lower Body Dressing: (+2) Where Assessed-Lower Body Dressing: Sitting at sink, Standing at sink ADL Comments: Unable to complete transfers on eval due to pain limiting mobility Vision   Perception    Praxis   Exercises:   Other Treatments:     Therapy/Group: Individual Therapy  Tonny Branch 04/08/2019, 12:15 PM

## 2019-04-08 NOTE — Progress Notes (Signed)
[facility] PHYSICAL MEDICINE & REHABILITATION PROGRESS NOTE   Subjective/Complaints: Patient seen sitting up this morning.  He states he slept well overnight.  He denies complaints.  ROS: Denies CP, SOB, N/V/D  Objective:   No results found. No results for input(s): WBC, HGB, HCT, PLT in the last 72 hours. No results for input(s): NA, K, CL, CO2, GLUCOSE, BUN, CREATININE, CALCIUM in the last 72 hours.  Intake/Output Summary (Last 24 hours) at 04/08/2019 0959 Last data filed at 04/08/2019 0848 Gross per 24 hour  Intake 720 ml  Output 250 ml  Net 470 ml     Physical Exam: Vital Signs Blood pressure (!) 109/54, pulse (!) 59, temperature 98.3 F (36.8 C), temperature source Oral, resp. rate 17, height 5' 5.98" (1.676 m), weight 92.3 kg, SpO2 98 %. Constitutional: No distress . Vital signs reviewed. HENT: Normocephalic.  Atraumatic. Eyes: EOMI. No discharge. Cardiovascular: No JVD. Respiratory: Normal effort.  No stridor. GI: Non-distended. Skin: Warm and dry.  Intact. Psych: Normal mood.  Normal behavior. Musc: Lower extremity edema Neuro: Alert Very HOH Motor: Bilateral upper extremities: 5/5 proximal distal Left lower extremity: Hip flexion, knee extension 4-- 4/5, ankle dorsiflexion 5/5 Right lower extremity: Hip flexion, knee extension 2/5, ankle dorsiflexion 5/5  Assessment/Plan: 1. Functional deficits secondary to R periprosthetic hip fracture due to ground level fall and poor balance which require 3+ hours per day of interdisciplinary therapy in a comprehensive inpatient rehab setting.  Physiatrist is providing close team supervision and 24 hour management of active medical problems listed below.  Physiatrist and rehab team continue to assess barriers to discharge/monitor patient progress toward functional and medical goals  Care Tool:  Bathing    Body parts bathed by patient: Right arm, Left arm, Chest, Abdomen, Front perineal area, Right upper leg, Left  upper leg, Right lower leg, Left lower leg, Buttocks   Body parts bathed by helper: Buttocks     Bathing assist Assist Level: Supervision/Verbal cueing     Upper Body Dressing/Undressing Upper body dressing   What is the patient wearing?: Pull over shirt    Upper body assist Assist Level: Independent    Lower Body Dressing/Undressing Lower body dressing      What is the patient wearing?: Incontinence brief, Pants     Lower body assist Assist for lower body dressing: Minimal Assistance - Patient > 75%     Toileting Toileting    Toileting assist Assist for toileting: Contact Guard/Touching assist     Transfers Chair/bed transfer  Transfers assist     Chair/bed transfer assist level: Supervision/Verbal cueing Chair/bed transfer assistive device: Programmer, multimedia   Ambulation assist   Ambulation activity did not occur: Safety/medical concerns(increased pain/decreased activity tolerance)  Assist level: Supervision/Verbal cueing Assistive device: Walker-rolling Max distance: 50'   Walk 10 feet activity   Assist  Walk 10 feet activity did not occur: Safety/medical concerns(increased pain/decreased activity tolerance)  Assist level: Supervision/Verbal cueing Assistive device: Walker-rolling   Walk 50 feet activity   Assist Walk 50 feet with 2 turns activity did not occur: Safety/medical concerns(increased pain/decreased activity tolerance)  Assist level: Supervision/Verbal cueing Assistive device: Walker-rolling    Walk 150 feet activity   Assist Walk 150 feet activity did not occur: Safety/medical concerns(increased pain/decreased activity tolerance)  Assist level: Supervision/Verbal cueing Assistive device: Walker-rolling    Walk 10 feet on uneven surface  activity   Assist Walk 10 feet on uneven surfaces activity did not occur: Safety/medical concerns(increased pain/decreased activity  tolerance)   Assist level: Contact  Guard/Touching assist Assistive device: Aeronautical engineer Will patient use wheelchair at discharge?: Yes Type of Wheelchair: Manual    Wheelchair assist level: Supervision/Verbal cueing Max wheelchair distance: 140'    Wheelchair 50 feet with 2 turns activity    Assist    Wheelchair 50 feet with 2 turns activity did not occur: Safety/medical concerns(increased pain/decreased activity tolerance)   Assist Level: Supervision/Verbal cueing   Wheelchair 150 feet activity     Assist  Wheelchair 150 feet activity did not occur: Safety/medical concerns(increased pain/decreased activity tolerance)       Blood pressure (!) 109/54, pulse (!) 59, temperature 98.3 F (36.8 C), temperature source Oral, resp. rate 17, height 5' 5.98" (1.676 m), weight 92.3 kg, SpO2 98 %.    Medical Problem List and Plan: 1. R hip periprosthetic fracture s/p ORIF secondary to ground level fall at home due to poor balance. PWB on RLE  Continue CIR 2. Antithrombotics: -DVT/anticoagulation:Pharmaceutical:Coumadin  INR therapeutic on 10/16 -antiplatelet therapy: NA 3. Pain Management:  continue gabapentin tid for neuropathy and Norco prn   Tramadol as needed added on 10/4 4. Mood:LCSW to follow for evaluation and support. -antipsychotic agents: N/A 5. Neuropsych: This patient is capable of making decisions on his own behalf. 6. Skin/Wound Care:Monitor wound for healing. Continue nutritional supplement to promote healing. 7. Fluids/Electrolytes/Nutrition:Monitor I/O.  8. Chronic A FIB: Monitor HR tid--continue coreg. Monitor for orthostatic symptoms.  Continue Coumadin 9. CLL: WBC trending back down- is normally in 20s.   WBCs 26.5 on 10/14  Cont to monitor 10 ABLA: Continue to monitor H/H--stabilizing around 8. On iron supplement.   Hemoglobin 10.4 on 10/14 11. T2DM with hyperglycemia: On lantus 35 units daily with SSI for elevated  BS. Monitor BS ac/hs and titrate lantus as indicated.   Decrease Lantus to 17 units and DC'd on 10/16  Home Metformin restarted on 10/16 CBG (last 3)  Recent Labs    04/07/19 1655 04/07/19 2111 04/08/19 0610  GLUCAP 131* 168* 99   Labile on 10/17  12.Hyponatremia: Resolved 13. Chronic cough (due to chronic aspiration): per records has refused dysphagia diet. On duonebs tid. On Neurontin for cough? Keep HOB>30 degrees  10/2-Duonebs TID since resp d/c'd AND made prn as well, so doesn't miss a dose with therapy. Will enc flutter valve use  10/16-DuoNebs restarted, home dose of TID  14. Constipation due to opioids-   Scheduled Senokot S 2 tabs BID and use prns as necessary,  Cont prn meds  Improving 15. Poor balance            vestibular PT  16. Elevated K+: Resolved  K+ 4.2 on 10/14 17.  Chronic incont related to bladder CA - f/u with urology as OP  18. LE edema  Home dose of Lasix 20  LOS: 19 days A FACE TO FACE EVALUATION WAS PERFORMED  Tanita Palinkas Lorie Phenix 04/08/2019, 9:59 AM

## 2019-04-09 ENCOUNTER — Inpatient Hospital Stay (HOSPITAL_COMMUNITY): Payer: Medicare Other | Admitting: Occupational Therapy

## 2019-04-09 ENCOUNTER — Inpatient Hospital Stay (HOSPITAL_COMMUNITY): Payer: Medicare Other

## 2019-04-09 DIAGNOSIS — I5022 Chronic systolic (congestive) heart failure: Secondary | ICD-10-CM

## 2019-04-09 LAB — GLUCOSE, CAPILLARY
Glucose-Capillary: 78 mg/dL (ref 70–99)
Glucose-Capillary: 100 mg/dL — ABNORMAL HIGH (ref 70–99)
Glucose-Capillary: 151 mg/dL — ABNORMAL HIGH (ref 70–99)
Glucose-Capillary: 141 mg/dL — ABNORMAL HIGH (ref 70–99)

## 2019-04-09 NOTE — Progress Notes (Addendum)
Occupational Therapy Session Note  Patient Details  Name: Oscar Baker MRN: [id number] Date of Birth: 1933/04/11  Today's Date: 04/09/2019 OT Individual Time: 1300-1414 OT Individual Time Calculation (min): 74 min   Short Term Goals: Week 3:  OT Short Term Goal 1 (Week 3): STG=LTG secondary to ELOS  Skilled Therapeutic Interventions/Progress Updates:    Pt greeted in his w/c with no c/o pain. Opting to wait until tomorrow for showering. Agreeable to go outdoors during session. Started with ambulatory toilet transfer using RW with supervision. Supervision for balance during toileting tasks with pt getting urine on his Ted hose. After handwashing at the sink, OT changed Ted stockings before taking him outside. Once outdoors, worked on dynamic standing balance and UB strengthening when navigating over uneven terrain and up/down inclines. Pt ambulated with the RW for 1 lap around the fountain with vcs for forward gaze. He self propelled the w/c for 1 lap as well. Next guided pt through isometric pushes holding for 5 seconds initially, progressing to 20 seconds. Discussed with pt importance of incorporating simple exercises like this into daily routine to maintain current level of functioning. He was then escorted back to the room. Toileting completed once again with the same assist, though Teds remained dry. After handwashing, pt transferred to the recliner. Left him with all needs within reach.    Therapy Documentation Precautions:  Precautions Precautions: Fall Restrictions Weight Bearing Restrictions: Yes RLE Weight Bearing: Partial weight bearing RLE Partial Weight Bearing Percentage or Pounds: 50 ADL: ADL Grooming: Setup Where Assessed-Grooming: Sitting at sink Upper Body Bathing: Minimal assistance Where Assessed-Upper Body Bathing: Sitting at sink Lower Body Bathing: Maximal assistance Where Assessed-Lower Body Bathing: Sitting at sink Upper Body Dressing: Minimal  assistance Where Assessed-Upper Body Dressing: Sitting at sink Lower Body Dressing: (+2) Where Assessed-Lower Body Dressing: Sitting at sink, Standing at sink ADL Comments: Unable to complete transfers on eval due to pain limiting mobility      Therapy/Group: Individual Therapy  Alexey Rhoads A Marygrace Sandoval 04/09/2019, 3:46 PM

## 2019-04-09 NOTE — Progress Notes (Signed)
Physical Therapy Session Note  Patient Details  Name: Oscar Baker MRN: [id number] Date of Birth: [birth year]  Today's Date: 04/09/2019 PT Individual Time: 1050-1205 PT Individual Time Calculation (min): 75 min   Short Term Goals: Week 3:  PT Short Term Goal 1 (Week 3): STG=LTG due to ELOS.  Skilled Therapeutic Interventions/Progress Updates:     Patient in w/c with his wife in the room upon PT arrival. Patient alert and agreeable to PT session. Patient denied pain throughout session. Patient in hospital gown at beginning of session, requested to put on clothes before leaving the room. Sitting in the w/c, patient donned a t-shirt and threaded B LEs through pants independently with set up assist and increased time to complete tasks. PT donned a incontinence brief, B thigh high TEDs and shoes with total A. Patient pulled up the brief and his pants in standing with supervision for safety.   Therapeutic Activity: Transfers: Patient performed sit to/from stand x5 with mod I using the RW. Patient demonstrated safe technique with proper hand placement on the RW, R hand on RW L hand pushing up/reaching back to promote decreased WBing on R LE without cues from therapist.   Gait Training:  Patient ambulated 155 feet using RW with supervision for safety due to decreased endurance, required 2 standing rest breaks 20-30 seconds each. Had patient talk through daily routine at home PTA during ambulation for duel task component. Mild decreased gait speed with walking while talking, but no LOB or unsteadiness noted. Ambulated with decreased gait speed, step-to gait pattern leading with R to maintain PWB on R LE, forward trunk lean, and decreased B step height R>L. Provided verbal cues for erect posture, walking between his hands in the RW, and increased step height. Patient performed side stepping 2x15 feet each direction with the RW with close supervision simulating entering/exiting his wife's bathroom. PT  provided demonstration for technique and use of UE to off-weight R LE. Patient reported he would need to side step into his wife's bathroom if her felt urgency with a BM at home and stated the RW would not fit ambulating forwards. PT provided cues for keeping toes facing forwards and staying close to the RW. Patient performed task with significant decreased gait speed, patient stated he would probably be faster to make it to his bathroom up the hall. PT in agreement. Educated on increased fall risk with increased gait speed and urgency going to the bathroom. Educated on wearing a brief at home with urgency to avoid rushing to the bathroom and losing his balance. Patient in agreement.  Patient performed the TUG using a RW during session: Trial 1: 34 seconds Trial 2: 30 seconds Based on TUG time, patient is at increased fall risk with ambulation. Educated patient on outcome and goals for this test as he progresses with therapy. Discussed fall risk/prevention and home modifications and activation of emergency response in the event of a fall. Patient very receptive to all education.   Wheelchair Mobility:  Patient propelled wheelchair 120 feet with supervision using B LEs with a pillow behind his back to simulate self propulsion with a transport chair, as patient will have one for community mobility at d/c. Required 3 sitting rest breaks throughout due to decreased LE strength/endurance with task. Provided verbal cues for scooting forward in the chair for improved body mechanics to use hamstrings efficiently during task.   Patient in w/c at end of session with breaks locked and all needs within reach.  Therapy Documentation Precautions:  Precautions Precautions: Fall Restrictions Weight Bearing Restrictions: Yes RLE Weight Bearing: Partial weight bearing RLE Partial Weight Bearing Percentage or Pounds: 50    Therapy/Group: Individual Therapy  Audric Venn L Joslynne Klatt PT, DPT  04/09/2019, 2:50 PM

## 2019-04-09 NOTE — Progress Notes (Signed)
[facility] PHYSICAL MEDICINE & REHABILITATION PROGRESS NOTE   Subjective/Complaints: Patient seen sitting up in his chair this morning.  He states he slept well overnight.  He is very jovial.    ROS: Denies CP, SOB, N/V/D  Objective:   No results found. No results for input(s): WBC, HGB, HCT, PLT in the last 72 hours. No results for input(s): NA, K, CL, CO2, GLUCOSE, BUN, CREATININE, CALCIUM in the last 72 hours.  Intake/Output Summary (Last 24 hours) at 04/09/2019 0845 Last data filed at 04/09/2019 0803 Gross per 24 hour  Intake 1076 ml  Output 475 ml  Net 601 ml     Physical Exam: Vital Signs Blood pressure 106/63, pulse 83, temperature 98.1 F (36.7 C), temperature source Oral, resp. rate 16, height 5' 5.98" (1.676 m), weight 123.4 kg, SpO2 95 %. Constitutional: No distress . Vital signs reviewed.  Morbidly obese. HENT: Normocephalic.  Atraumatic. Eyes: EOMI. No discharge. Cardiovascular: No JVD. Respiratory: Normal effort.  No stridor. GI: Non-distended. Skin: Right hip with partial dressing C/D/I Psych: Normal mood.  Normal behavior. Musc: Lower extremity edema Neuro: Alert Very HOH Motor: Bilateral upper extremities: 5/5 proximal distal Left lower extremity: Hip flexion, knee extension 4/5, ankle dorsiflexion 5/5 Right lower extremity: Hip flexion 2+/5, knee extension 3+/5, ankle dorsiflexion 5/5  Assessment/Plan: 1. Functional deficits secondary to R periprosthetic hip fracture due to ground level fall and poor balance which require 3+ hours per day of interdisciplinary therapy in a comprehensive inpatient rehab setting.  Physiatrist is providing close team supervision and 24 hour management of active medical problems listed below.  Physiatrist and rehab team continue to assess barriers to discharge/monitor patient progress toward functional and medical goals  Care Tool:  Bathing    Body parts bathed by patient: Right arm, Left arm, Chest, Abdomen, Front  perineal area, Right upper leg, Left upper leg, Right lower leg, Left lower leg, Buttocks   Body parts bathed by helper: Buttocks     Bathing assist Assist Level: Supervision/Verbal cueing     Upper Body Dressing/Undressing Upper body dressing   What is the patient wearing?: Pull over shirt    Upper body assist Assist Level: Independent    Lower Body Dressing/Undressing Lower body dressing      What is the patient wearing?: Incontinence brief, Pants     Lower body assist Assist for lower body dressing: Minimal Assistance - Patient > 75%     Toileting Toileting    Toileting assist Assist for toileting: Contact Guard/Touching assist     Transfers Chair/bed transfer  Transfers assist     Chair/bed transfer assist level: Supervision/Verbal cueing Chair/bed transfer assistive device: Programmer, multimedia   Ambulation assist   Ambulation activity did not occur: Safety/medical concerns(increased pain/decreased activity tolerance)  Assist level: Supervision/Verbal cueing Assistive device: Walker-rolling Max distance: 150   Walk 10 feet activity   Assist  Walk 10 feet activity did not occur: Safety/medical concerns(increased pain/decreased activity tolerance)  Assist level: Supervision/Verbal cueing Assistive device: Walker-rolling   Walk 50 feet activity   Assist Walk 50 feet with 2 turns activity did not occur: Safety/medical concerns(increased pain/decreased activity tolerance)  Assist level: Supervision/Verbal cueing Assistive device: Walker-rolling    Walk 150 feet activity   Assist Walk 150 feet activity did not occur: Safety/medical concerns(increased pain/decreased activity tolerance)  Assist level: Supervision/Verbal cueing Assistive device: Walker-rolling    Walk 10 feet on uneven surface  activity   Assist Walk 10 feet on uneven surfaces  activity did not occur: Safety/medical concerns(increased pain/decreased activity  tolerance)   Assist level: Contact Guard/Touching assist Assistive device: Aeronautical engineer Will patient use wheelchair at discharge?: Yes Type of Wheelchair: Manual    Wheelchair assist level: Supervision/Verbal cueing Max wheelchair distance: 140'    Wheelchair 50 feet with 2 turns activity    Assist    Wheelchair 50 feet with 2 turns activity did not occur: Safety/medical concerns(increased pain/decreased activity tolerance)   Assist Level: Supervision/Verbal cueing   Wheelchair 150 feet activity     Assist  Wheelchair 150 feet activity did not occur: Safety/medical concerns(increased pain/decreased activity tolerance)       Blood pressure 106/63, pulse 83, temperature 98.1 F (36.7 C), temperature source Oral, resp. rate 16, height 5' 5.98" (1.676 m), weight 123.4 kg, SpO2 95 %.    Medical Problem List and Plan: 1. R hip periprosthetic fracture s/p ORIF secondary to ground level fall at home due to poor balance. PWB on RLE  Continue CIR 2. Antithrombotics: -DVT/anticoagulation:Pharmaceutical:Coumadin  INR therapeutic on 10/16, no labs today -antiplatelet therapy: NA 3. Pain Management:  continue gabapentin tid for neuropathy and Norco prn   Tramadol as needed added on 10/4 4. Mood:LCSW to follow for evaluation and support. -antipsychotic agents: N/A 5. Neuropsych: This patient is capable of making decisions on his own behalf. 6. Skin/Wound Care:Monitor wound for healing. Continue nutritional supplement to promote healing. 7. Fluids/Electrolytes/Nutrition:Monitor I/O.  8. Chronic A FIB: Monitor HR tid--continue coreg. Monitor for orthostatic symptoms.  Continue Coumadin, see #2 9. CLL: WBC trending back down- is normally in 20s.   WBCs 26.5 on 10/14, labs ordered for tomorrow  Cont to monitor 10 ABLA: Continue to monitor H/H--stabilizing around 8. On iron supplement.   Hemoglobin 10.4 on  10/14, labs ordered for tomorrow 11. T2DM with hyperglycemia: On lantus 35 units daily with SSI for elevated BS. Monitor BS ac/hs and titrate lantus as indicated.   Decrease Lantus to 17 units and DC'd on 10/16  Home Metformin restarted on 10/16 CBG (last 3)  Recent Labs    04/08/19 1655 04/08/19 2107 04/09/19 0630  GLUCAP 163* 143* 78   Labile on 10/18, monitor for trend 12.Hyponatremia: Resolved 13. Chronic cough (due to chronic aspiration): per records has refused dysphagia diet. On duonebs tid. On Neurontin for cough? Keep HOB>30 degrees  10/2-Duonebs TID since resp d/c'd AND made prn as well, so doesn't miss a dose with therapy.  Will enc flutter valve use  10/16-DuoNebs restarted, home dose of TID  Last chest x-ray on 03/10/2019, reviewed, relatively unremarkable.  Repeat chest x-ray ordered.  14. Constipation due to opioids-   Scheduled Senokot S 2 tabs BID and use prns as necessary,  Cont prn meds  Improving 15. Poor balance            vestibular PT  16. Elevated K+: Resolved  K+ 4.2 on 10/14 17.  Chronic incont related to bladder CA - f/u with urology as OP  18. LE edema  Home dose of Lasix 20 19.  Chronic systolic congestive heart failure Filed Weights   04/06/19 0445 04/08/19 0800 04/09/19 0312  Weight: 92.3 kg 92.6 kg 123.4 kg   ?  Reliability on 10/18  DG chest ordered  BNP ordered for tomorrow AM  LOS: 20 days A FACE TO FACE EVALUATION WAS PERFORMED  Abigail Marsiglia Lorie Phenix 04/09/2019, 8:45 AM

## 2019-04-10 ENCOUNTER — Inpatient Hospital Stay (HOSPITAL_COMMUNITY): Payer: Medicare Other

## 2019-04-10 ENCOUNTER — Ambulatory Visit (HOSPITAL_REHABILITATION): Payer: Medicare Other | Admitting: *Deleted

## 2019-04-10 DIAGNOSIS — I255 Ischemic cardiomyopathy: Secondary | ICD-10-CM | POA: Diagnosis not present

## 2019-04-10 DIAGNOSIS — I5033 Acute on chronic diastolic (congestive) heart failure: Secondary | ICD-10-CM

## 2019-04-10 LAB — GLUCOSE, CAPILLARY
Glucose-Capillary: 93 mg/dL (ref 70–99)
Glucose-Capillary: 96 mg/dL (ref 70–99)
Glucose-Capillary: 147 mg/dL — ABNORMAL HIGH (ref 70–99)
Glucose-Capillary: 152 mg/dL — ABNORMAL HIGH (ref 70–99)

## 2019-04-10 LAB — BASIC METABOLIC PANEL
Anion gap: 9 (ref 5–15)
BUN: 16 mg/dL (ref 8–23)
CO2: 26 mmol/L (ref 22–32)
Calcium: 9.3 mg/dL (ref 8.9–10.3)
Chloride: 103 mmol/L (ref 98–111)
Creatinine, Ser: 0.94 mg/dL (ref 0.61–1.24)
GFR calc Af Amer: >60 mL/min (ref >60)
GFR calc non Af Amer: >60 mL/min (ref >60)
Glucose, Bld: 140 mg/dL — ABNORMAL HIGH (ref 70–99)
Potassium: 4.1 mmol/L (ref 3.5–5.1)
Sodium: 138 mmol/L (ref 135–145)

## 2019-04-10 LAB — CBC
HCT: 35.6 % — ABNORMAL LOW (ref 39.0–52.0)
Hemoglobin: 11.8 g/dL — ABNORMAL LOW (ref 13.0–17.0)
MCH: 34.5 pg — ABNORMAL HIGH (ref 26.0–34.0)
MCHC: 33.1 g/dL (ref 30.0–36.0)
MCV: 104.1 fL — ABNORMAL HIGH (ref 80.0–100.0)
Platelets: 293 10*3/uL (ref 150–400)
RBC: 3.42 MIL/uL — ABNORMAL LOW (ref 4.22–5.81)
RDW: 20.3 % — ABNORMAL HIGH (ref 11.5–15.5)
WBC: 25.9 10*3/uL — ABNORMAL HIGH (ref 4.0–10.5)
nRBC: 0 % (ref 0.0–0.2)

## 2019-04-10 LAB — PROTIME-INR
INR: 2.3 — ABNORMAL HIGH (ref 0.8–1.2)
Prothrombin Time: 25.2 seconds — ABNORMAL HIGH (ref 11.4–15.2)

## 2019-04-10 LAB — BRAIN NATRIURETIC PEPTIDE: B Natriuretic Peptide: 228.5 pg/mL — ABNORMAL HIGH (ref 0.0–100.0)

## 2019-04-10 NOTE — Progress Notes (Signed)
Oscar Baker for warfarin Indication: atrial fibrillation  Patient Measurements: Height: 5' 5.98" (167.6 cm) Weight: 272 lb 0.8 oz (123.4 kg) IBW/kg (Calculated) : 63.75  Vital Signs: BP: 101/56 (10/19 0649) Pulse Rate: 59 (10/19 0649)  Labs: Recent Labs    04/10/19 0904  HGB 11.8*  HCT 35.6*  PLT 293  LABPROT 25.2*  INR 2.3*  CREATININE 0.94    Estimated Creatinine Clearance: 72.5 mL/min (by C-G formula based on SCr of 0.94 mg/dL).   Assessment: Oscar Baker on warfarin for atrial fibrillation. Warfarin resumed on 9/23 s/p THA surgery. INR has been therapeutic and relatively stable.  PO intake has been stable per charting.  No bleeding reported, CBC stable.  Home regimen: 5mg  PO daily except 7.5mg  on Tues and Sat.    Goal of Therapy:  INR 2-3 Monitor platelets by anticoagulation protocol: Yes   Plan:  Continue Coumadin 5mg  PO daily at 1800 INR on MWF only   Thank you for involving pharmacy in this patient's care.  Oscar Baker, PharmD, BCPS Clinical Pharmacist Clinical phone for 04/10/2019 until 3p is [phone] 04/10/2019 1:00 PM  **Pharmacist phone directory can be found on [email or web address] listed under [place]**

## 2019-04-10 NOTE — Progress Notes (Signed)
Physical Therapy Discharge Summary  Patient Details  Name: Oscar Baker MRN: [id number] Date of Birth: 18-Sep-1932  Today's Date: 04/10/2019 PT Individual Time: 1300-1355 PT Individual Time Calculation (min): 55 min    Patient has met 7 of 7 long term goals due to improved activity tolerance, improved balance, improved postural control, increased strength, increased range of motion, decreased pain and ability to compensate for deficits.  Patient to discharge at an ambulatory level Supervision.   Patient's care partner is independent to provide the necessary physical assistance at discharge.  Recommendation:  Patient will benefit from ongoing skilled PT services in home health setting to continue to advance safe functional mobility, address ongoing impairments in strength, ROM, functional mobility, balance, patient/caregiver education, and minimize fall risk.  Equipment: transport chair for community access  Reasons for discharge: treatment goals met  Patient/family agrees with progress made and goals achieved: Yes  Skilled Therapeutic Interventions: Patient in recliner with his wife in the room upon PT arrival. Patient alert and agreeable to PT session and denied pain throughout session. Patient's wife participated in family education throughout session.   Therapeutic Activity: Bed Mobility: Patient performed rolling R/L supine to/from sit independently in the ADL bed. Reported increased R hip discomfort lying on R side, educated on performing this as tolerated at home. Transfers: Patient performed sit to/from stand and stand pivot transfers throughout session with mod I using a RW from the w/c, ADL recliner and sofa, and recliner in the room. Demonstrated correct hand placement and placing R foot forward to off weight his R LE during transfers without cuing. He performed a car transfer with supervision for safety and cues for hand placement. Patient's wife comfortable with providing cues  and guarding with patient.  Gait Training:  Patient ambulated 165 feet x2 using RW with supervision >50 feet due to decreased endurance for safety, otherwise mod I up to 50 feet. Ambulated with step-to gait pattern on R, see other details below. Provided verbal cues for erect posture and staying close to the RW with fatigue for safety. He ambulated up/down a ramp, over 10 feet of unlevel surfaces, and up/down a 7" curb with supervision for safety. Provided cues for use of RW of mulch.   Wheelchair Mobility:  Patient propelled wheelchair 50 feet with supervision using B LEs for simulating independence with use of transport chair at home or in the commuinty. Distance limited due to decreased activity tolerance.  Provided verbal cues for proper technique and putting a pillow behind him to promote proper body mechanics.   Reviewed patient's HEP he has been performing in the room and provided education on fall risk/prevention, home safety, and activation of emergency services throughout session.   Patient in recliner with his wife in the room at end of session with breaks locked and all needs within reach.    PT Discharge Precautions/Restrictions Restrictions Weight Bearing Restrictions: Yes RLE Weight Bearing: Partial weight bearing Vision/Perception  Vision - Assessment Tracking/Visual Pursuits: Decreased smoothness of eye movement to RIGHT inferior field;Decreased smoothness of eye movement to RIGHT superior field;Decreased smoothness of horizontal tracking;Decreased smoothness of eye movement to LEFT superior field;Decreased smoothness of eye movement to LEFT inferior field;Decreased smoothness of vertical tracking Saccades: Overshoots Convergence: Within functional limits Perception Perception: Within Functional Limits Praxis Praxis: Intact  Cognition Overall Cognitive Status: Within Functional Limits for tasks assessed Arousal/Alertness: Awake/alert Attention:  Focused;Sustained Focused Attention: Appears intact Sustained Attention: Appears intact Memory: Appears intact Awareness: Appears intact Problem Solving: Appears intact Safety/Judgment:  Appears intact Sensation Sensation Light Touch: Appears Intact Hot/Cold: Appears Intact Proprioception: Appears Intact Coordination Gross Motor Movements are Fluid and Coordinated: No Fine Motor Movements are Fluid and Coordinated: Yes Coordination and Movement Description: R hip fx with PWB, decreased strength/activity tolerance, but improving Motor  Motor Motor: Other (comment) Motor - Skilled Clinical Observations: generalized weakness and limited by pain in R hip due to fracture and PWB status  Mobility Bed Mobility Bed Mobility: Rolling Right;Rolling Left;Supine to Sit;Sit to Supine Rolling Right: Independent Rolling Left: Independent Supine to Sit: Independent Sit to Supine: Independent Transfers Transfers: Sit to Stand;Stand to Sit;Stand Pivot Transfers Sit to Stand: Independent with assistive device Stand to Sit: Independent with assistive device Stand Pivot Transfers: Independent with assistive device Stand Pivot Transfer Details (indicate cue type and reason): none Transfer (Assistive device): Rolling walker Locomotion  Gait Ambulation: Yes Gait Assistance: Independent with assistive device Gait Distance (Feet): 165 Feet Assistive device: Rolling walker Gait Gait: Yes Gait Pattern: Impaired Gait Pattern: Step-to pattern;Decreased stance time - right;Decreased hip/knee flexion - right;Trunk flexed;Decreased trunk rotation;Right foot flat;Left foot flat Gait velocity: decreased High Level Ambulation High Level Ambulation: Side stepping Side Stepping: with RW x15 feet with supervision for navigating narrow space in a bathroom at home Stairs / Additional Locomotion Stairs: Yes Stairs Assistance: Supervision/Verbal cueing Stair Management Technique: With walker Number of  Stairs: 1 Height of Stairs: 7 Ramp: Supervision/Verbal cueing Curb: Supervision/Verbal cueing Wheelchair Mobility Wheelchair Mobility: Yes Wheelchair Assistance: Chartered loss adjuster: Both lower extermities(to simulate transport chair) Wheelchair Parts Management: Supervision/cueing Distance: 50'  Trunk/Postural Assessment  Cervical Assessment Cervical Assessment: Exceptions to WFL(forward head) Thoracic Assessment Thoracic Assessment: Exceptions to WFL(kyphosis) Lumbar Assessment Lumbar Assessment: Exceptions to WFL(posterior pelvic tilt) Postural Control Postural Control: Deficits on evaluation(delayed/decreased)  Balance Balance Balance Assessed: Yes Static Sitting Balance Static Sitting - Balance Support: No upper extremity supported;Feet supported Static Sitting - Level of Assistance: 7: Independent Dynamic Sitting Balance Dynamic Sitting - Balance Support: No upper extremity supported;Feet supported Dynamic Sitting - Level of Assistance: 7: Independent Dynamic Sitting - Balance Activities: Lateral lean/weight shifting;Forward lean/weight shifting;Reaching for objects Static Standing Balance Static Standing - Balance Support: No upper extremity supported;During functional activity Static Standing - Level of Assistance: 6: Modified independent (Device/Increase time) Dynamic Standing Balance Dynamic Standing - Balance Support: Left upper extremity supported;Right upper extremity supported;During functional activity Dynamic Standing - Level of Assistance: 6: Modified independent (Device/Increase time) Dynamic Standing - Balance Activities: Lateral lean/weight shifting;Forward lean/weight shifting;Reaching for objects Extremity Assessment  RUE Assessment RUE Assessment: Within Functional Limits LUE Assessment LUE Assessment: Within Functional Limits RLE Assessment RLE Assessment: Exceptions to [facility] Active Range of Motion (AROM) Comments: hip  extension at least to neutral in lying, limited hip flexion ~100 degrees in lying General Strength Comments: Grossly at least 3+/5 throughout with functional mobility LLE Assessment LLE Assessment: Within Functional Limits Active Range of Motion (AROM) Comments: WFL for all functional mobility General Strength Comments: WFL for all functional mobility    Jayra Choyce L [place] Hedman PT, DPT  04/10/2019, 8:02 AM

## 2019-04-10 NOTE — Discharge Summary (Signed)
Occupational Therapy Discharge Summary  Patient Details  Name: Oscar Baker MRN: [id number] Date of Birth: 13-Oct-1932  Patient has met 9 of 9 long term goals due to improved activity tolerance, improved balance, postural control, ability to compensate for deficits, improved awareness and improved coordination.  Pt made excellent progress with BADLs during this admission.  Pt completes bathing, dressing and toileting tasks with supervision.  All functional transfers with supervision.  Pt's wife has been present for therapy and provides the appropriate level of supervision.  Patient to discharge at overall Supervision level.  Patient's care partner is independent to provide the necessary assistance at discharge.     Recommendation:  Patient will benefit from ongoing skilled OT services in home health setting to continue to advance functional skills in the area of BADL and iADL.  Equipment: No equipment provided Pt purchased TTB from local vendor  Reasons for discharge: treatment goals met and discharge from hospital  Patient/family agrees with progress made and goals achieved: Yes  OT Discharge Vision Baseline Vision/History: Wears glasses Wears Glasses: Reading only Patient Visual Report: No change from baseline Vision Assessment?: Yes Eye Alignment: Within Functional Limits Ocular Range of Motion: Within Functional Limits Alignment/Gaze Preference: Within Defined Limits Tracking/Visual Pursuits: Decreased smoothness of eye movement to RIGHT superior field;Decreased smoothness of eye movement to RIGHT inferior field;Decreased smoothness of eye movement to LEFT inferior field;Decreased smoothness of eye movement to LEFT superior field;Requires cues, head turns, or add eye shifts to track Saccades: Overshoots;Additional eye shifts occurred during testing;Additional head turns occurred during testing Convergence: Within functional limits Perception  Perception: Within Functional  Limits Praxis Praxis: Intact Cognition Overall Cognitive Status: Within Functional Limits for tasks assessed Arousal/Alertness: Awake/alert Orientation Level: Oriented X4 Attention: Focused;Sustained Focused Attention: Appears intact Sustained Attention: Appears intact Memory: Appears intact Immediate Memory Recall: Sock;Blue;Bed Memory Recall Sock: Without Cue Memory Recall Blue: Without Cue Memory Recall Bed: Without Cue Awareness: Appears intact Problem Solving: Appears intact Safety/Judgment: Appears intact Sensation Sensation Light Touch: Appears Intact Hot/Cold: Appears Intact Proprioception: Appears Intact Coordination Gross Motor Movements are Fluid and Coordinated: Yes Fine Motor Movements are Fluid and Coordinated: Yes Coordination and Movement Description: R hip fx with PWB, decreased strength/activity tolerance, but improving Motor  Motor Motor: Other (comment) Motor - Skilled Clinical Observations: generalized weakness and limited by pain in R hip due to fracture and PWB status Mobility  Bed Mobility Bed Mobility: Rolling Right;Rolling Left;Supine to Sit;Sit to Supine Rolling Right: Independent Rolling Left: Independent Supine to Sit: Independent Sit to Supine: Independent Transfers Sit to Stand: Independent with assistive device Stand to Sit: Independent with assistive device  Trunk/Postural Assessment  Cervical Assessment Cervical Assessment: Exceptions to WFL(forward head) Thoracic Assessment Thoracic Assessment: Exceptions to WFL(kyphosis) Lumbar Assessment Lumbar Assessment: Exceptions to WFL(posterior pelvic tilt) Postural Control Postural Control: Deficits on evaluation(delayed/decreased)  Balance Static Sitting Balance Static Sitting - Balance Support: Feet supported;Bilateral upper extremity supported Static Sitting - Level of Assistance: 6: Modified independent (Device/Increase time) Dynamic Sitting Balance Dynamic Sitting - Balance  Support: During functional activity Dynamic Sitting - Level of Assistance: 6: Modified independent (Device/Increase time) Extremity/Trunk Assessment RUE Assessment RUE Assessment: Within Functional Limits LUE Assessment LUE Assessment: Within Functional Limits   [name] 04/10/2019, 10:59 AM

## 2019-04-10 NOTE — Progress Notes (Signed)
Occupational Therapy Session Note  Patient Details  Name: Oscar Baker MRN: [id number] Date of Birth: July 22, 1932  Today's Date: 04/10/2019 OT Individual Time: 0815-0900 OT Individual Time Calculation (min): 45 min    Short Term Goals: Week 3:  OT Short Term Goal 1 (Week 3): STG=LTG secondary to ELOS  Skilled Therapeutic Interventions/Progress Updates:    OT intervention with focus on functional amb with RW, standing balance, toileting, bathing at shower level, and dressing with sit<stand from seat. Pt amb with RW to bathroom and urinated while standing at toilet before transferring to shower.  Pt used long handle sponge to assist with bathing.  Pt returned to room and completed dressing with sit<>stand.  Pt stood at sink to complete grooming tasks.  Pt completed all tasks at supervision level.  Pt does not exhibit any unsafe behaviors. Pt's wife present for therapy session.  Pt and wife pleased with progress and ready for discharge tomorrow.   Therapy Documentation Precautions:  Precautions Precautions: Fall Restrictions Weight Bearing Restrictions: Yes RLE Weight Bearing: Partial weight bearing RLE Partial Weight Bearing Percentage or Pounds: 50  Pain:  Pt with no c/o pain this morning  Therapy/Group: Individual Therapy  [name] 04/10/2019, 9:03 AM

## 2019-04-10 NOTE — Progress Notes (Signed)
Social Work Discharge Note   The overall goal for the admission was met for:   Discharge location: Yes-HOME WITH [name] WHO CAN PROVIDE 24 HR SUPERVISION LEVEL  Length of Stay: Yes-22 DAYS  Discharge activity level: Yes-SUPERVISION LEVEL  Home/community participation: Yes  Services provided included: MD, RD, PT, OT, RN, CM, Pharmacy and SW  Financial Services: Medicare and Private Insurance: [place]  Follow-up services arranged: Home Health: [facility]-PT,OT,RN, DME: [facility]-TRANSPORT CHAIR and Patient/Family has no preference for HH/DME agencies  Comments (or additional information):[name] WAS HERE DAILY AND PARTICIPATED IN THERAPIES. BOTH FEEL COMFORTABLE WITH HIS DC AND READY TO GO HOME.  Patient/Family verbalized understanding of follow-up arrangements: Yes  Individual responsible for coordination of the follow-up plan: [name]-WIFE  Confirmed correct DME delivered: Oscar Baker 04/10/2019    Oscar Baker

## 2019-04-10 NOTE — Plan of Care (Signed)
  Problem: Consults Goal: RH GENERAL PATIENT EDUCATION Description: See Patient Education module for education specifics. Outcome: Progressing Goal: Skin Care Protocol Initiated - if Braden Score 18 or less Description: If consults are not indicated, leave blank or document N/A Outcome: Progressing Goal: Diabetes Guidelines if Diabetic/Glucose > 140 Description: If diabetic or lab glucose is > 140 mg/dl - Initiate Diabetes/Hyperglycemia Guidelines & Document Interventions  Outcome: Progressing   Problem: RH BOWEL ELIMINATION Goal: RH STG MANAGE BOWEL WITH ASSISTANCE Description: STG Manage Bowel with min Assistance. Outcome: Progressing Flowsheets (Taken 04/10/2019 1511) STG: Pt will manage bowels with assistance: 5-Supervision/curing Goal: RH STG MANAGE BOWEL W/MEDICATION W/ASSISTANCE Description: STG Manage Bowel with Medication with min Assistance. Outcome: Progressing Flowsheets (Taken 04/10/2019 1511) STG: Pt will manage bowels with medication with assistance: 5-Supervision/cueing   Problem: RH SKIN INTEGRITY Goal: RH STG SKIN FREE OF INFECTION/BREAKDOWN Description: Patients skin will remain free from further infection or breakdown with mod assist. Outcome: Progressing Goal: RH STG MAINTAIN SKIN INTEGRITY WITH ASSISTANCE Description: STG Maintain Skin Integrity With mod Assistance. Outcome: Progressing Flowsheets (Taken 04/10/2019 1511) STG: Maintain skin integrity with assistance: 4-Minimal assistance Goal: RH STG ABLE TO PERFORM INCISION/WOUND CARE W/ASSISTANCE Description: STG Able To Perform Incision/Wound Care With total Assistance from caregiver. Outcome: Progressing Flowsheets (Taken 04/10/2019 1511) STG: Pt will be able to perform incision/wound care with assistance: 4-Minimal assistance   Problem: RH SAFETY Goal: RH STG ADHERE TO SAFETY PRECAUTIONS W/ASSISTANCE/DEVICE Description: STG Adhere to Safety Precautions With supervision Assistance/Device. Outcome:  Progressing Flowsheets (Taken 04/10/2019 1511) STG:Pt will adhere to safety precautions with assistance/device: 4-Minimal assistance   Problem: RH PAIN MANAGEMENT Goal: RH STG PAIN MANAGED AT OR BELOW PT'S PAIN GOAL Description: < 4 Outcome: Progressing

## 2019-04-10 NOTE — Progress Notes (Signed)
[facility] PHYSICAL MEDICINE & REHABILITATION PROGRESS NOTE   Subjective/Complaints: Patient reports still has a spot on butt- that's stinging/bothersome.   Wondering what's the care he should do at home for it. Explained barrier cream/ointment is good, and cover with dressing.   ROS: Denies CP, SOB, N/V/D  Objective:   Dg Chest 2 View  Result Date: 04/09/2019 CLINICAL DATA:  Cough. EXAM: CHEST - 2 VIEW COMPARISON:  March 10, 2019 FINDINGS: Atelectasis in the left base. Stable pacemaker. The heart, hila, mediastinum, lungs, and pleura are otherwise normal. IMPRESSION: No active cardiopulmonary disease. Electronically Signed   By: Dorise Bullion III M.D   On: 04/09/2019 12:46   Recent Labs    04/10/19 0904  WBC 25.9*  HGB 11.8*  HCT 35.6*  PLT 293   Recent Labs    04/10/19 0904  NA 138  K 4.1  CL 103  CO2 26  GLUCOSE 140*  BUN 16  CREATININE 0.94  CALCIUM 9.3    Intake/Output Summary (Last 24 hours) at 04/10/2019 1009 Last data filed at 04/10/2019 0700 Gross per 24 hour  Intake 1070 ml  Output 1000 ml  Net 70 ml     Physical Exam: Vital Signs Blood pressure (!) 101/56, pulse (!) 59, temperature 98.3 F (36.8 C), temperature source Oral, resp. rate 18, height 5' 5.98" (1.676 m), weight 123.4 kg, SpO2 95 %. Constitutional: No distress . Vital signs and labs reviewed; sitting up in bedside chair; wife at bedside, NAD HENT: Normocephalic.  Atraumatic. Eyes: EOMI. Conjugate gaze Cardiovascular: No JVD. Respiratory: CTA B/L GI: Non-distended. Skin: Right hip with partial dressing C/D/I- almost completely healed; has a split that's obviously healing between crack of buttocks. Granulation tissue seen. Lipoma on R forearm. Psych: Normal mood.  Normal behavior; bright affect. Musc: Lower extremity edema Neuro: Alert Very HOH Motor: Bilateral upper extremities: 5/5 proximal distal Left lower extremity: Hip flexion, knee extension 4/5, ankle dorsiflexion  5/5 Right lower extremity: Hip flexion 2+/5, knee extension 3+/5, ankle dorsiflexion 5/5 Able to stand with RW for me to look at backside  Assessment/Plan: 1. Functional deficits secondary to R periprosthetic hip fracture due to ground level fall and poor balance which require 3+ hours per day of interdisciplinary therapy in a comprehensive inpatient rehab setting.  Physiatrist is providing close team supervision and 24 hour management of active medical problems listed below.  Physiatrist and rehab team continue to assess barriers to discharge/monitor patient progress toward functional and medical goals  Care Tool:  Bathing    Body parts bathed by patient: Right arm, Left arm, Chest, Abdomen, Front perineal area, Right upper leg, Left upper leg, Right lower leg, Left lower leg, Buttocks   Body parts bathed by helper: Buttocks     Bathing assist Assist Level: Supervision/Verbal cueing     Upper Body Dressing/Undressing Upper body dressing   What is the patient wearing?: Pull over shirt    Upper body assist Assist Level: Independent    Lower Body Dressing/Undressing Lower body dressing      What is the patient wearing?: Underwear/pull up, Pants     Lower body assist Assist for lower body dressing: Supervision/Verbal cueing     Toileting Toileting    Toileting assist Assist for toileting: Supervision/Verbal cueing     Transfers Chair/bed transfer  Transfers assist     Chair/bed transfer assist level: Independent with assistive device Chair/bed transfer assistive device: Programmer, multimedia   Ambulation assist   Ambulation activity did  not occur: Safety/medical concerns(increased pain/decreased activity tolerance)  Assist level: Supervision/Verbal cueing Assistive device: Walker-rolling Max distance: 155'   Walk 10 feet activity   Assist  Walk 10 feet activity did not occur: Safety/medical concerns(increased pain/decreased activity  tolerance)  Assist level: Supervision/Verbal cueing Assistive device: Walker-rolling   Walk 50 feet activity   Assist Walk 50 feet with 2 turns activity did not occur: Safety/medical concerns(increased pain/decreased activity tolerance)  Assist level: Supervision/Verbal cueing Assistive device: Walker-rolling    Walk 150 feet activity   Assist Walk 150 feet activity did not occur: Safety/medical concerns(increased pain/decreased activity tolerance)  Assist level: Supervision/Verbal cueing Assistive device: Walker-rolling    Walk 10 feet on uneven surface  activity   Assist Walk 10 feet on uneven surfaces activity did not occur: Safety/medical concerns(increased pain/decreased activity tolerance)   Assist level: Contact Guard/Touching assist Assistive device: Aeronautical engineer Will patient use wheelchair at discharge?: Yes Type of Wheelchair: Manual    Wheelchair assist level: Supervision/Verbal cueing Max wheelchair distance: 30' using B LEs, limited by fatigue    Wheelchair 50 feet with 2 turns activity    Assist    Wheelchair 50 feet with 2 turns activity did not occur: Safety/medical concerns(increased pain/decreased activity tolerance)   Assist Level: Supervision/Verbal cueing   Wheelchair 150 feet activity     Assist  Wheelchair 150 feet activity did not occur: Safety/medical concerns(increased pain/decreased activity tolerance)       Blood pressure (!) 101/56, pulse (!) 59, temperature 98.3 F (36.8 C), temperature source Oral, resp. rate 18, height 5' 5.98" (1.676 m), weight 123.4 kg, SpO2 95 %.    Medical Problem List and Plan: 1. R hip periprosthetic fracture s/p ORIF secondary to ground level fall at home due to poor balance. PWB on RLE  Continue CIR 2. Antithrombotics: -DVT/anticoagulation:Pharmaceutical:Coumadin  INR therapeutic on 10/16, no labs today  10/19- INR 2.3 -antiplatelet  therapy: NA 3. Pain Management:  continue gabapentin tid for neuropathy and Norco prn   Tramadol as needed added on 10/4 4. Mood:LCSW to follow for evaluation and support. -antipsychotic agents: N/A 5. Neuropsych: This patient is capable of making decisions on his own behalf. 6. Skin/Wound Care:Monitor wound for healing. Continue nutritional supplement to promote healing. 7. Fluids/Electrolytes/Nutrition:Monitor I/O.  8. Chronic A FIB: Monitor HR tid--continue coreg. Monitor for orthostatic symptoms.  Continue Coumadin, see #2 9. CLL: WBC trending back down- is normally in 20s.   WBCs 26.5 on 10/14, labs ordered for tomorrow  Cont to monitor 10 ABLA: Continue to monitor H/H--stabilizing around 8. On iron supplement.   Hemoglobin 10.4 on 10/14, labs ordered for tomorrow 11. T2DM with hyperglycemia: On lantus 35 units daily with SSI for elevated BS. Monitor BS ac/hs and titrate lantus as indicated.   Decrease Lantus to 17 units and DC'd on 10/16  Home Metformin restarted on 10/16 CBG (last 3)  Recent Labs    04/09/19 1638 04/09/19 2134 04/10/19 0620  GLUCAP 151* 141* 93   Well controlled 10/19 12.Hyponatremia: Resolved 13. Chronic cough (due to chronic aspiration): per records has refused dysphagia diet. On duonebs tid. On Neurontin for cough? Keep HOB>30 degrees  10/2-Duonebs TID since resp d/c'd AND made prn as well, so doesn't miss a dose with therapy.  Will enc flutter valve use  10/16-DuoNebs restarted, home dose of TID  Last chest x-ray on 03/10/2019, reviewed, relatively unremarkable.  Repeat chest x-ray ordered.  10/18- CXR looks good  14. Constipation due  to opioids-   Scheduled Senokot S 2 tabs BID and use prns as necessary,  Cont prn meds  Improving 15. Poor balance            vestibular PT  16. Elevated K+: Resolved  K+ 4.2 on 10/14 17.  Chronic incont related to bladder CA - f/u with urology as OP  18. LE edema  Home dose of Lasix 20 19.   Chronic systolic congestive heart failure Filed Weights   04/08/19 0800 04/09/19 0312 04/09/19 0800  Weight: 92.6 kg 123.4 kg 123.4 kg   ?  Reliability on 10/18  DG chest ordered  BNP ordered for tomorrow AM  10/19- think 92 kg and 123kg are wrong- weights are changing 20 -30 kg depending on scale used. CXR looks OK- no fluid overload.  20- Dispo  10/19- will f/u with Mr Gurule in my clinic- d/c tomorrow LOS: 21 days A FACE TO FACE EVALUATION WAS PERFORMED  Pierra Skora 04/10/2019, 10:09 AM

## 2019-04-10 NOTE — Progress Notes (Signed)
Occupational Therapy Session Note  Patient Details  Name: Oscar Baker MRN: [id number] Date of Birth: [birth year]  Today's Date: 04/10/2019 OT Individual Time: 1000-1100 OT Individual Time Calculation (min): 60 min    Short Term Goals: Week 3:  OT Short Term Goal 1 (Week 3): STG=LTG secondary to ELOS  Skilled Therapeutic Interventions/Progress Updates:    OT intervention with focus on functional amb with RW, TTB transfers, bed transfers/mobility, toileting, and family education to continue preparation for discharge tomorrow. Pt amb with RW to tub room and ADL apartment.  All transfers with supervision.  Pt will need to side step into home bathroom and pt completed tasks with supervision.  Pt's wife provided appropriate level of supervision. Pt performs bed transfers and supine<>sit EOB with supervision.  Pt returned to room and remained in recliner with all needs within reach and wife present.  Pt and wife pleased with progress and ready for discharge tomorrow.   Therapy Documentation Precautions:  Precautions Precautions: Fall Restrictions Weight Bearing Restrictions: Yes RLE Weight Bearing: Partial weight bearing RLE Partial Weight Bearing Percentage or Pounds: 50 Pain: Pain Assessment Pain Scale: 0-10 Pain Score: 0-No pain   Therapy/Group: Individual Therapy  [name] 04/10/2019, 11:02 AM

## 2019-04-11 LAB — CUP PACEART REMOTE DEVICE CHECK
Date Time Interrogation Session: 20201020151149
Implantable Lead Implant Date: 20150820
Implantable Lead Location: 753860
Implantable Lead Model: 350
Implantable Lead Serial Number: 29625633
Implantable Pulse Generator Implant Date: 20150820
Pulse Gen Model: 394934
Pulse Gen Serial Number: 68372291

## 2019-04-11 LAB — GLUCOSE, CAPILLARY: Glucose-Capillary: 116 mg/dL — ABNORMAL HIGH (ref 70–99)

## 2019-04-11 MED ORDER — METFORMIN HCL 850 MG PO TABS: 850.0000 mg | ORAL_TABLET | Freq: Two times a day (BID) | ORAL | 0 refills | Status: DC

## 2019-04-11 MED ORDER — FERROUS SULFATE 325 (65 FE) MG PO TABS: 325.0000 mg | ORAL_TABLET | Freq: Every day | ORAL | 0 refills | Status: DC

## 2019-04-11 MED ORDER — TRAMADOL HCL 50 MG PO TABS: 50.0000 mg | ORAL_TABLET | Freq: Two times a day (BID) | ORAL | 0 refills | Status: AC | PRN

## 2019-04-11 MED ORDER — FUROSEMIDE 20 MG PO TABS: 20.0000 mg | ORAL_TABLET | Freq: Every day | ORAL | Status: AC

## 2019-04-11 NOTE — Plan of Care (Signed)
Pt. And his wife got d/c papers and equipment,pt. Is ready to go home.

## 2019-04-11 NOTE — Progress Notes (Signed)
[place] PHYSICAL MEDICINE & REHABILITATION PROGRESS NOTE   Subjective/Complaints: Patient seen sitting up in his chair this morning performing ADLs.  He states he slept well overnight, slept fairly well per sleep chart.  Wife at bedside.  He is ready for discharge.  Discussed follow-up appointment with wife.  ROS: Limited due to [facility], but appears to deny CP, SOB, N/V/D  Objective:   Dg Chest 2 View  Result Date: 04/09/2019 CLINICAL DATA:  Cough. EXAM: CHEST - 2 VIEW COMPARISON:  March 10, 2019 FINDINGS: Atelectasis in the left base. Stable pacemaker. The heart, hila, mediastinum, lungs, and pleura are otherwise normal. IMPRESSION: No active cardiopulmonary disease. Electronically Signed   By: Dorise Bullion III M.D   On: 04/09/2019 12:46   Recent Labs    04/10/19 0904  WBC 25.9*  HGB 11.8*  HCT 35.6*  PLT 293   Recent Labs    04/10/19 0904  NA 138  K 4.1  CL 103  CO2 26  GLUCOSE 140*  BUN 16  CREATININE 0.94  CALCIUM 9.3    Intake/Output Summary (Last 24 hours) at 04/11/2019 0851 Last data filed at 04/11/2019 0740 Gross per 24 hour  Intake 1320 ml  Output 300 ml  Net 1020 ml     Physical Exam: Vital Signs Blood pressure 111/60, pulse 61, temperature 98 F (36.7 C), temperature source Oral, resp. rate 17, height 5' 5.98" (1.676 m), weight 93.4 kg, SpO2 97 %. Constitutional: No distress . Vital signs reviewed. HENT: Normocephalic.  Atraumatic. Eyes: EOMI. No discharge. Cardiovascular: No JVD. Respiratory: Normal effort.  No stridor. GI: Non-distended. Skin: Warm and dry.  Intact on visible skin. Psych: Normal mood.  Normal behavior. Neuro: Alert Very HOH   Assessment/Plan: 1. Functional deficits secondary to R periprosthetic hip fracture due to ground level fall and poor balance which require 3+ hours per day of interdisciplinary therapy in a comprehensive inpatient rehab setting.  Physiatrist is providing close team supervision and 24 hour  management of active medical problems listed below.  Physiatrist and rehab team continue to assess barriers to discharge/monitor patient progress toward functional and medical goals  Care Tool:  Bathing    Body parts bathed by patient: Right arm, Left arm, Chest, Abdomen, Front perineal area, Right upper leg, Left upper leg, Right lower leg, Left lower leg, Buttocks   Body parts bathed by helper: Buttocks     Bathing assist Assist Level: Supervision/Verbal cueing     Upper Body Dressing/Undressing Upper body dressing   What is the patient wearing?: Pull over shirt    Upper body assist Assist Level: Independent    Lower Body Dressing/Undressing Lower body dressing      What is the patient wearing?: Underwear/pull up, Pants     Lower body assist Assist for lower body dressing: Supervision/Verbal cueing     Toileting Toileting    Toileting assist Assist for toileting: Supervision/Verbal cueing     Transfers Chair/bed transfer  Transfers assist     Chair/bed transfer assist level: Independent with assistive device Chair/bed transfer assistive device: Programmer, multimedia   Ambulation assist   Ambulation activity did not occur: Safety/medical concerns(increased pain/decreased activity tolerance)  Assist level: Supervision/Verbal cueing Assistive device: Walker-rolling Max distance: 165'   Walk 10 feet activity   Assist  Walk 10 feet activity did not occur: Safety/medical concerns(increased pain/decreased activity tolerance)  Assist level: Independent with assistive device Assistive device: Walker-rolling   Walk 50 feet activity   Assist Walk  50 feet with 2 turns activity did not occur: Safety/medical concerns(increased pain/decreased activity tolerance)  Assist level: Independent with assistive device Assistive device: Walker-rolling    Walk 150 feet activity   Assist Walk 150 feet activity did not occur: Safety/medical  concerns(increased pain/decreased activity tolerance)  Assist level: Supervision/Verbal cueing(supervision for safety due to decreased activity tolerance) Assistive device: Walker-rolling    Walk 10 feet on uneven surface  activity   Assist Walk 10 feet on uneven surfaces activity did not occur: Safety/medical concerns(increased pain/decreased activity tolerance)   Assist level: Supervision/Verbal cueing Assistive device: Aeronautical engineer Will patient use wheelchair at discharge?: Yes(transport chair) Type of Wheelchair: Manual    Wheelchair assist level: Supervision/Verbal cueing Max wheelchair distance: 50'(using B LEs for transport chair)    Wheelchair 50 feet with 2 turns activity    Assist    Wheelchair 50 feet with 2 turns activity did not occur: Safety/medical concerns(increased pain/decreased activity tolerance)   Assist Level: Supervision/Verbal cueing   Wheelchair 150 feet activity     Assist  Wheelchair 150 feet activity did not occur: Safety/medical concerns(decreased strength/endurance)       Blood pressure 111/60, pulse 61, temperature 98 F (36.7 C), temperature source Oral, resp. rate 17, height 5' 5.98" (1.676 m), weight 93.4 kg, SpO2 97 %.    Medical Problem List and Plan: 1. R hip periprosthetic fracture s/p ORIF secondary to ground level fall at home due to poor balance. PWB on RLE  DC today  Will see patient for transitional care management in 1-2 weeks post-discharge 2. Antithrombotics: -DVT/anticoagulation:Pharmaceutical:Coumadin  INR therapeutic on 10/19 -antiplatelet therapy: NA 3. Pain Management:  continue gabapentin tid for neuropathy and Norco prn   Tramadol as needed added on 10/4 4. Mood:LCSW to follow for evaluation and support. -antipsychotic agents: N/A 5. Neuropsych: This patient is capable of making decisions on his own behalf. 6. Skin/Wound Care:Monitor  wound for healing. Continue nutritional supplement to promote healing. 7. Fluids/Electrolytes/Nutrition:Monitor I/O.  8. Chronic A FIB: Monitor HR tid--continue coreg. Monitor for orthostatic symptoms.  Continue Coumadin, see #2 9. CLL: WBC trending back down- is normally in 20s.   WBCs 25.9 on 10/19  Cont to monitor 10 ABLA: Continue to monitor H/H--stabilizing around 8. On iron supplement.   Hemoglobin 11.8 on 10/19 11. T2DM with hyperglycemia: On lantus 35 units daily with SSI for elevated BS. Monitor BS ac/hs and titrate lantus as indicated.   Decrease Lantus to 17 units and DC'd on 10/16  Home Metformin restarted on 10/16 CBG (last 3)  Recent Labs    04/10/19 1709 04/10/19 2200 04/11/19 0644  GLUCAP 147* 152* 116*   Slightly labile on 10/20 12.Hyponatremia: Resolved 13. Chronic cough (due to chronic aspiration): per records has refused dysphagia diet. On duonebs tid. On Neurontin for cough? Keep HOB>30 degrees  10/2-Duonebs TID since resp d/c'd AND made prn as well, so doesn't miss a dose with therapy.  Will enc flutter valve use  10/16-DuoNebs restarted, home dose of TID  Chest x-ray stable  14. Constipation due to opioids-   Scheduled Senokot S 2 tabs BID and use prns as necessary,  Cont prn meds  Improving 15. Poor balance            vestibular PT  16. Elevated K+: Resolved 17.  Chronic incont related to bladder CA - f/u with urology as OP  18. LE edema  Home dose of Lasix 20 19.  Chronic systolic congestive  heart failure Filed Weights   04/09/19 0312 04/09/19 0800 04/10/19 1400  Weight: 123.4 kg 123.4 kg 93.4 kg   ?  Reliability on 10/19  BNP elevated on 10/19, ambulatory follow-up  LOS: 22 days A FACE TO FACE EVALUATION WAS PERFORMED  Shyteria Lewis Lorie Phenix 04/11/2019, 8:51 AM

## 2019-04-12 DIAGNOSIS — C61 Malignant neoplasm of prostate: Secondary | ICD-10-CM | POA: Diagnosis not present

## 2019-04-13 ENCOUNTER — Telehealth: Payer: Self-pay

## 2019-04-13 DIAGNOSIS — I495 Sick sinus syndrome: Secondary | ICD-10-CM | POA: Diagnosis not present

## 2019-04-13 DIAGNOSIS — I252 Old myocardial infarction: Secondary | ICD-10-CM | POA: Diagnosis not present

## 2019-04-13 DIAGNOSIS — R05 Cough: Secondary | ICD-10-CM | POA: Diagnosis not present

## 2019-04-13 DIAGNOSIS — R32 Unspecified urinary incontinence: Secondary | ICD-10-CM | POA: Diagnosis not present

## 2019-04-13 DIAGNOSIS — I2510 Atherosclerotic heart disease of native coronary artery without angina pectoris: Secondary | ICD-10-CM | POA: Diagnosis not present

## 2019-04-13 DIAGNOSIS — Z950 Presence of cardiac pacemaker: Secondary | ICD-10-CM | POA: Diagnosis not present

## 2019-04-13 DIAGNOSIS — Z8551 Personal history of malignant neoplasm of bladder: Secondary | ICD-10-CM | POA: Diagnosis not present

## 2019-04-13 DIAGNOSIS — D62 Acute posthemorrhagic anemia: Secondary | ICD-10-CM | POA: Diagnosis not present

## 2019-04-13 DIAGNOSIS — S72001D Fracture of unspecified part of neck of right femur, subsequent encounter for closed fracture with routine healing: Secondary | ICD-10-CM | POA: Diagnosis not present

## 2019-04-13 DIAGNOSIS — I5022 Chronic systolic (congestive) heart failure: Secondary | ICD-10-CM | POA: Diagnosis not present

## 2019-04-13 DIAGNOSIS — H918X1 Other specified hearing loss, right ear: Secondary | ICD-10-CM | POA: Diagnosis not present

## 2019-04-13 DIAGNOSIS — I480 Paroxysmal atrial fibrillation: Secondary | ICD-10-CM | POA: Diagnosis not present

## 2019-04-13 DIAGNOSIS — E785 Hyperlipidemia, unspecified: Secondary | ICD-10-CM | POA: Diagnosis not present

## 2019-04-13 DIAGNOSIS — Z7984 Long term (current) use of oral hypoglycemic drugs: Secondary | ICD-10-CM | POA: Diagnosis not present

## 2019-04-13 DIAGNOSIS — I110 Hypertensive heart disease with heart failure: Secondary | ICD-10-CM | POA: Diagnosis not present

## 2019-04-13 DIAGNOSIS — Z8701 Personal history of pneumonia (recurrent): Secondary | ICD-10-CM | POA: Diagnosis not present

## 2019-04-13 DIAGNOSIS — M9701XD Periprosthetic fracture around internal prosthetic right hip joint, subsequent encounter: Secondary | ICD-10-CM | POA: Diagnosis not present

## 2019-04-13 DIAGNOSIS — S20221D Contusion of right back wall of thorax, subsequent encounter: Secondary | ICD-10-CM | POA: Diagnosis not present

## 2019-04-13 DIAGNOSIS — C9110 Chronic lymphocytic leukemia of B-cell type not having achieved remission: Secondary | ICD-10-CM | POA: Diagnosis not present

## 2019-04-13 DIAGNOSIS — E119 Type 2 diabetes mellitus without complications: Secondary | ICD-10-CM | POA: Diagnosis not present

## 2019-04-13 DIAGNOSIS — M1990 Unspecified osteoarthritis, unspecified site: Secondary | ICD-10-CM | POA: Diagnosis not present

## 2019-04-13 DIAGNOSIS — Z7901 Long term (current) use of anticoagulants: Secondary | ICD-10-CM | POA: Diagnosis not present

## 2019-04-13 DIAGNOSIS — I255 Ischemic cardiomyopathy: Secondary | ICD-10-CM | POA: Diagnosis not present

## 2019-04-13 DIAGNOSIS — H6691 Otitis media, unspecified, right ear: Secondary | ICD-10-CM | POA: Diagnosis not present

## 2019-04-13 DIAGNOSIS — I4510 Unspecified right bundle-branch block: Secondary | ICD-10-CM | POA: Diagnosis not present

## 2019-04-13 NOTE — Telephone Encounter (Signed)
  [place]  Patient Name: Oscar Baker DOB: 1932/09/25 Appointment Date and Time: 04-24-2019 / 940am With: Zella Ball first then Dr. Dagoberto Ligas  Questions for our staff to ask patients on Transitional care 48 hour phone call:   1. Are you/is patient experiencing any problems since coming home? NO     Are there any questions regarding any aspect of care? NO  2. Are there any questions regarding medications administration/dosing? NO     Are meds being taken as prescribed? YES  3. Have there been any falls? NO  4. Has Home Health been to the house and/or have they contacted you?  YES     If not, have you tried to contact them? NO     Can we help you contact them? NO  5. Are bowels and bladder emptying properly? YES     Are there any unexpected incontinence issues? NO     If applicable, is patient following bowel/bladder programs?NO  6. Any fevers, problems with breathing, unexpected pain? NO  7. Are there any skin problems or new areas of breakdown? NO  8. Has the patient/family member arranged specialty MD follow up (ie cardiology/neurology/renal/surgical/etc)? YES     Can we help arrange? NA  9. Does the patient need any other services or support that we can help arrange? NO  10. Are caregivers following through as expected in assisting the patient? YES  11. Has the patient quit smoking, drinking alcohol, or using drugs as recommended? NA  [facility] [address] [phone]

## 2019-04-13 NOTE — Discharge Summary (Signed)
Physician Discharge Summary  Patient ID: Oscar Baker MRN: [id number] DOB/AGE: 02-07-1933 85 y.o.  Admit date: 03/20/2019 Discharge date: 04/11/2019  Discharge Diagnoses:  Principal Problem:   Periprosthetic fracture around internal prosthetic right hip joint ([place]) Active Problems:   PAF (paroxysmal atrial fibrillation) ([place])   Hypertensive disorder   Diabetes mellitus type 2, noninsulin dependent (HCC)   Cough variant asthma   Chronic systolic CHF (congestive heart failure) (HCC)   CLL (chronic lymphocytic leukemia) ([place])   Acute blood loss anemia   Chronic systolic congestive heart failure ([place])   Discharged Condition:  Stable   Significant Diagnostic Studies: Dg Chest 2 View  Result Date: 04/09/2019 CLINICAL DATA:  Cough. EXAM: CHEST - 2 VIEW COMPARISON:  March 10, 2019 FINDINGS: Atelectasis in the left base. Stable pacemaker. The heart, hila, mediastinum, lungs, and pleura are otherwise normal. IMPRESSION: No active cardiopulmonary disease. Electronically Signed   By: Dorise Bullion III M.D   On: 04/09/2019 12:46   Dg Hip Port Unilat With Pelvis 1v Right  Result Date: 03/15/2019 CLINICAL DATA:  Status post surgical repair of right hip fracture. EXAM: DG HIP (WITH OR WITHOUT PELVIS) 1V PORT RIGHT COMPARISON:  Radiographs June 09, 2019. FINDINGS: Patient is status post surgical fixation of periprosthetic fracture involving the proximal right femur. Status post right total hip arthroplasty. IMPRESSION: Status post surgical internal fixation of proximal right femoral periprosthetic fracture. Electronically Signed   By: Marijo Conception M.D.   On: 03/15/2019 16:15    Labs:  Basic Metabolic Panel: . BMP Latest Ref Rng & Units 04/10/2019 04/05/2019 04/03/2019  Glucose 70 - 99 mg/dL 140(H) 99 98  BUN 8 - 23 mg/dL 16 16 15   Creatinine 0.61 - 1.24 mg/dL 0.94 0.87 0.85  BUN/Creat Ratio 10 - 24 - - -  Sodium 135 - 145 mmol/L 138 138 137  Potassium 3.5 - 5.1 mmol/L 4.1 4.2  4.5  Chloride 98 - 111 mmol/L 103 104 104  CO2 22 - 32 mmol/L 26 27 24   Calcium 8.9 - 10.3 mg/dL 9.3 8.7(L) 8.6(L)    CBC: CBC Latest Ref Rng & Units 04/10/2019 04/05/2019 04/03/2019  WBC 4.0 - 10.5 K/uL 25.9(H) 26.5(H) 27.3(H)  Hemoglobin 13.0 - 17.0 g/dL 11.8(L) 10.4(L) 10.1(L)  Hematocrit 39.0 - 52.0 % 35.6(L) 32.3(L) 32.7(L)  Platelets 150 - 400 K/uL 293 304 362    CBG: Recent Labs  Lab 04/10/19 0620 04/10/19 1151 04/10/19 1709 04/10/19 2200 04/11/19 0644  GLUCAP 93 96 147* 152* 116*    Protime/INR Lab Results  Component Value Date   INR 2.3 (H) 04/10/2019   INR 2.6 (H) 04/07/2019   INR 2.4 (H) 04/05/2019    Brief HPI:   Oscar Baker is a 85 y.o. male with history of A Fib, SSS s/p PPM, T2DM, CLL who was admitted on 03/10/19 after a fall backwards and struck his chest and mid back.  He was found to have minimally displaced right periprostatic hip fracture with mild bruising on back with large hematoma.  Chronic Coumadin allowed to drift down and he underwent ORIF right hip on 09/23 by Dr. Alvan Dame.  To be partial weightbearing for 6 to 8 weeks and Coumadin resumed.  Hospital course has been significant for issues with a flutter, leukocytosis with rising WBCs to 43.4, hyponatremia as well as acute blood loss anemia.  No fevers noted and WBC is slowly trending down.  Blood pressures have been soft.  Therapy evaluations completed and CIR recommended due to  decline in mobility and ADLs   Hospital Course: Oscar Baker was admitted to rehab 03/20/2019 for inpatient therapies to consist of PT and OT at least three hours five days a week. Past admission physiatrist, therapy team and rehab RN have worked together to provide customized collaborative inpatient rehab.  He was maintained on Coumadin throughout his stay and INR is therapeutic on home dose.  Pain control was initially an issue but has improved with addition of tramadol at bedtime to help with sleep-wake cycle and adequate  rest.  Currently pain is controlled with as needed use of Tylenol as well as local measures. Blood pressures were monitored on TID basis and have been stable.  Chronic systolic heart failure has been monitored with daily weights and for any signs of overload. He was noted to have increasing lower extremity edema as well as a port trend in weights.  Chest x-ray done showed no evidence of fluid overload.  Furosemide was resumed at 20 mg/day.   Serial check of BMET transient rise in potassium.  Follow-up labs show shows renal status and potassium levels to be WNL.  Serial CBC shows acute blood loss anemia is resolving and white count trending down to baseline.  No fevers noted.  His diabetes has been monitored with ac/hs CBG checks and SSI was use prn for tighter BS control.  Lantus was discontinued and Metformin was resumed at 500 mg twice daily and titrated upwards to home dose.  Blood sugars are currently controlled on Metformin alone.  Right hip incisions are C/D/INR healing well without any signs of infection. OIC has resolved to with adjustment of bowel program. He is continent of bowel and bladder.  He has made gains during rehab stay and is currently at modified independent to supervision level. He will continue to receive follow up HHPT, [place] and [place] by [facility] at home fter discharge   Rehab course: During patient's stay in rehab weekly team conferences were held to monitor patient's progress, set goals and discuss barriers to discharge. At admission, patient required max assist with mobility and max assist +2 for standing with basic self-care task. Hee  has had improvement in activity tolerance, balance, postural control as well as ability to compensate for deficits.  He is able to complete ADL tasks with supervision he is independent for sit to stand transfers and is able to ambulate 165 feet with assistive device.  Family education was completed with wife who can provide assistance as needed after  discharge   Disposition: Home  Diet: Heart healthy/carb modified medium  Special Instructions: 1.  Continue partial weightbearing on right lower extremity. 2.  Home health RN to draw a pro time on 10/23 with results to [facility] coumadin [facility]   Discharge Instructions    Ambulatory referral to Physical Medicine Rehab   Complete by: As directed    1-2 weeks transitional care appt     Allergies as of 04/11/2019      Reactions   Altace [ramipril] Other (See Comments)   "throat felt like had a knot in it"   Augmentin [amoxicillin-pot Clavulanate] Diarrhea   severe   Codeine Nausea And Vomiting   Nausea and vomiting   Simvastatin Other (See Comments)   Leg aches      Medication List    STOP taking these medications   glipiZIDE 2.5 MG 24 hr tablet Commonly known as: GLUCOTROL XL   HYDROcodone-homatropine 5-1.5 MG/5ML syrup Commonly known as: HYCODAN   HYDROmorphone 2 MG  tablet Commonly known as: DILAUDID   methocarbamol 500 MG tablet Commonly known as: Robaxin   senna-docusate 8.6-50 MG tablet Commonly known as: Senokot-S   sitaGLIPtin 100 MG tablet Commonly known as: JANUVIA     TAKE these medications   acetaminophen 325 MG tablet Commonly known as: TYLENOL Take 2 tablets (650 mg total) by mouth every 4 (four) hours as needed for mild pain. What changed:   medication strength  how much to take  when to take this  reasons to take this   atorvastatin 20 MG tablet Commonly known as: LIPITOR TAKE ONE TABLET EACH DAY AT 6PM What changed: See the new instructions.   carvedilol 3.125 MG tablet Commonly known as: COREG TAKE ONE TABLET TWICE DAILY What changed: when to take this   fenofibrate micronized 200 MG capsule Commonly known as: LOFIBRA TAKE ONE CAPSULE EACH DAY BEFORE BREAKFAST What changed: See the new instructions.   ferrous sulfate 325 (65 FE) MG tablet Take 1 tablet (325 mg total) by mouth daily with lunch.   furosemide 20 MG  tablet Commonly known as: LASIX Take 1 tablet (20 mg total) by mouth daily. What changed: See the new instructions.   gabapentin 300 MG capsule Commonly known as: Neurontin Take 1 capsule (300 mg total) by mouth 3 (three) times daily.   guaiFENesin 600 MG 12 hr tablet Commonly known as: MUCINEX Take 600 mg by mouth 2 (two) times daily as needed for cough.   ipratropium 0.02 % nebulizer solution Commonly known as: ATROVENT TAKE 2.59mL BY NEBULIZER 3 TIMES DAILY What changed: See the new instructions.   metFORMIN 850 MG tablet Commonly known as: GLUCOPHAGE Take 1 tablet (850 mg total) by mouth 2 (two) times daily with a meal. What changed:   medication strength  how much to take   multivitamin with minerals Tabs tablet Take 1 tablet by mouth daily.   polyethylene glycol 17 g packet Commonly known as: MIRALAX / GLYCOLAX Take 17 g by mouth daily.   traMADol 50 MG tablet--Rx# 14 pills Commonly known as: ULTRAM Take 1 tablet (50 mg total) by mouth every 12 (twelve) hours as needed for severe pain. Notes to patient: We have been giving you a dose at bedtime to help with pain at the end of the day. You can use this or 2 tylenol after supper/before bedtime.    Vios LC Sprint Deluxe Misc Use 1 each every 6 (six) hours as needed (shortness of breath or wheezing) Fax to [facility]   vitamin B-12 1000 MCG tablet Commonly known as: CYANOCOBALAMIN Take 1,000 mcg by mouth daily.   warfarin 5 MG tablet Commonly known as: COUMADIN Take as directed. If you are unsure how to take this medication, talk to your nurse or doctor. Original instructions: TAKE AS DIRECTED BY COUMADIN CLINIC What changed: See the new instructions. Notes to patient: Take 7.5 mg on Tue/Sat and 5 mg on all other days.       Follow-up Information    Paralee Cancel, MD. Call on 04/12/2019.   Specialty: Orthopedic Surgery Why: for post op check Contact information: [address] [place]  [address] [phone]        [name], MD. Call.   Specialty: Internal Medicine Why: for post hospital follow up.  Contact information: [place] [address] [phone]        Jamse Arn, MD Follow up.   Specialty: Physical Medicine and Rehabilitation Why: Office will call you with  follow up appointment Contact information: [name] [address] [phone]           Signed: Bary Leriche 04/13/2019, 1:16 PM

## 2019-04-14 DIAGNOSIS — Z4789 Encounter for other orthopedic aftercare: Secondary | ICD-10-CM | POA: Diagnosis not present

## 2019-04-15 DIAGNOSIS — D62 Acute posthemorrhagic anemia: Secondary | ICD-10-CM | POA: Diagnosis not present

## 2019-04-15 DIAGNOSIS — I110 Hypertensive heart disease with heart failure: Secondary | ICD-10-CM | POA: Diagnosis not present

## 2019-04-15 DIAGNOSIS — S72001D Fracture of unspecified part of neck of right femur, subsequent encounter for closed fracture with routine healing: Secondary | ICD-10-CM | POA: Diagnosis not present

## 2019-04-15 DIAGNOSIS — I5022 Chronic systolic (congestive) heart failure: Secondary | ICD-10-CM | POA: Diagnosis not present

## 2019-04-15 DIAGNOSIS — M9701XD Periprosthetic fracture around internal prosthetic right hip joint, subsequent encounter: Secondary | ICD-10-CM | POA: Diagnosis not present

## 2019-04-15 DIAGNOSIS — S20221D Contusion of right back wall of thorax, subsequent encounter: Secondary | ICD-10-CM | POA: Diagnosis not present

## 2019-04-17 ENCOUNTER — Encounter: Payer: Self-pay | Admitting: Podiatry

## 2019-04-17 ENCOUNTER — Ambulatory Visit (INDEPENDENT_AMBULATORY_CARE_PROVIDER_SITE_OTHER): Payer: Medicare Other | Admitting: Podiatry

## 2019-04-17 ENCOUNTER — Other Ambulatory Visit: Payer: Self-pay

## 2019-04-17 DIAGNOSIS — I5022 Chronic systolic (congestive) heart failure: Secondary | ICD-10-CM | POA: Diagnosis not present

## 2019-04-17 DIAGNOSIS — S72001D Fracture of unspecified part of neck of right femur, subsequent encounter for closed fracture with routine healing: Secondary | ICD-10-CM | POA: Diagnosis not present

## 2019-04-17 DIAGNOSIS — S20221D Contusion of right back wall of thorax, subsequent encounter: Secondary | ICD-10-CM | POA: Diagnosis not present

## 2019-04-17 DIAGNOSIS — M79675 Pain in left toe(s): Secondary | ICD-10-CM

## 2019-04-17 DIAGNOSIS — B351 Tinea unguium: Secondary | ICD-10-CM | POA: Diagnosis not present

## 2019-04-17 DIAGNOSIS — I110 Hypertensive heart disease with heart failure: Secondary | ICD-10-CM | POA: Diagnosis not present

## 2019-04-17 DIAGNOSIS — M9701XD Periprosthetic fracture around internal prosthetic right hip joint, subsequent encounter: Secondary | ICD-10-CM | POA: Diagnosis not present

## 2019-04-17 DIAGNOSIS — M79674 Pain in right toe(s): Secondary | ICD-10-CM

## 2019-04-17 DIAGNOSIS — D62 Acute posthemorrhagic anemia: Secondary | ICD-10-CM | POA: Diagnosis not present

## 2019-04-17 NOTE — Patient Instructions (Signed)
Diabetes Mellitus and Foot Care Foot care is an important part of your health, especially when you have diabetes. Diabetes may cause you to have problems because of poor blood flow (circulation) to your feet and legs, which can cause your skin to:  Become thinner and drier.  Break more easily.  Heal more slowly.  Peel and crack. You may also have nerve damage (neuropathy) in your legs and feet, causing decreased feeling in them. This means that you may not notice minor injuries to your feet that could lead to more serious problems. Noticing and addressing any potential problems early is the best way to prevent future foot problems. How to care for your feet Foot hygiene  Wash your feet daily with warm water and mild soap. Do not use hot water. Then, pat your feet and the areas between your toes until they are completely dry. Do not soak your feet as this can dry your skin.  Trim your toenails straight across. Do not dig under them or around the cuticle. File the edges of your nails with an emery board or nail file.  Apply a moisturizing lotion or petroleum jelly to the skin on your feet and to dry, brittle toenails. Use lotion that does not contain alcohol and is unscented. Do not apply lotion between your toes. Shoes and socks  Wear clean socks or stockings every day. Make sure they are not too tight. Do not wear knee-high stockings since they may decrease blood flow to your legs.  Wear shoes that fit properly and have enough cushioning. Always look in your shoes before you put them on to be sure there are no objects inside.  To break in new shoes, wear them for just a few hours a day. This prevents injuries on your feet. Wounds, scrapes, corns, and calluses  Check your feet daily for blisters, cuts, bruises, sores, and redness. If you cannot see the bottom of your feet, use a mirror or ask someone for help.  Do not cut corns or calluses or try to remove them with medicine.  If you  find a minor scrape, cut, or break in the skin on your feet, keep it and the skin around it clean and dry. You may clean these areas with mild soap and water. Do not clean the area with peroxide, alcohol, or iodine.  If you have a wound, scrape, corn, or callus on your foot, look at it several times a day to make sure it is healing and not infected. Check for: ? Redness, swelling, or pain. ? Fluid or blood. ? Warmth. ? Pus or a bad smell. General instructions  Do not cross your legs. This may decrease blood flow to your feet.  Do not use heating pads or hot water bottles on your feet. They may burn your skin. If you have lost feeling in your feet or legs, you may not know this is happening until it is too late.  Protect your feet from hot and cold by wearing shoes, such as at the beach or on hot pavement.  Schedule a complete foot exam at least once a year (annually) or more often if you have foot problems. If you have foot problems, report any cuts, sores, or bruises to your health care provider immediately. Contact a health care provider if:  You have a medical condition that increases your risk of infection and you have any cuts, sores, or bruises on your feet.  You have an injury that is not   healing.  You have redness on your legs or feet.  You feel burning or tingling in your legs or feet.  You have pain or cramps in your legs and feet.  Your legs or feet are numb.  Your feet always feel cold.  You have pain around a toenail. Get help right away if:  You have a wound, scrape, corn, or callus on your foot and: ? You have pain, swelling, or redness that gets worse. ? You have fluid or blood coming from the wound, scrape, corn, or callus. ? Your wound, scrape, corn, or callus feels warm to the touch. ? You have pus or a bad smell coming from the wound, scrape, corn, or callus. ? You have a fever. ? You have a red line going up your leg. Summary  Check your feet every day  for cuts, sores, red spots, swelling, and blisters.  Moisturize feet and legs daily.  Wear shoes that fit properly and have enough cushioning.  If you have foot problems, report any cuts, sores, or bruises to your health care provider immediately.  Schedule a complete foot exam at least once a year (annually) or more often if you have foot problems. This information is not intended to replace advice given to you by your health care provider. Make sure you discuss any questions you have with your health care provider. Document Released: 06/05/2000 Document Revised: 07/21/2017 Document Reviewed: 07/10/2016 Elsevier Patient Education  2020 [employer]  

## 2019-04-18 ENCOUNTER — Ambulatory Visit (INDEPENDENT_AMBULATORY_CARE_PROVIDER_SITE_OTHER): Payer: Medicare Other | Admitting: Cardiovascular Disease

## 2019-04-18 DIAGNOSIS — Z5181 Encounter for therapeutic drug level monitoring: Secondary | ICD-10-CM | POA: Diagnosis not present

## 2019-04-18 DIAGNOSIS — S20221D Contusion of right back wall of thorax, subsequent encounter: Secondary | ICD-10-CM | POA: Diagnosis not present

## 2019-04-18 DIAGNOSIS — I110 Hypertensive heart disease with heart failure: Secondary | ICD-10-CM | POA: Diagnosis not present

## 2019-04-18 DIAGNOSIS — D62 Acute posthemorrhagic anemia: Secondary | ICD-10-CM | POA: Diagnosis not present

## 2019-04-18 DIAGNOSIS — M9701XD Periprosthetic fracture around internal prosthetic right hip joint, subsequent encounter: Secondary | ICD-10-CM | POA: Diagnosis not present

## 2019-04-18 DIAGNOSIS — S72001D Fracture of unspecified part of neck of right femur, subsequent encounter for closed fracture with routine healing: Secondary | ICD-10-CM | POA: Diagnosis not present

## 2019-04-18 DIAGNOSIS — I5022 Chronic systolic (congestive) heart failure: Secondary | ICD-10-CM | POA: Diagnosis not present

## 2019-04-18 LAB — POCT INR: INR: 3.2 — AB (ref 2.0–3.0)

## 2019-04-18 NOTE — Patient Instructions (Addendum)
Description    Spoke to [facility] while they were at the pt's house, instructed pt to hold today, then continue taking 1 tablet daily except 1.5 tablets on Tuesdays and Saturdays. Recheck in 2 weeks.  Call Coumad[facility] [phone] with any concerns.

## 2019-04-20 DIAGNOSIS — I110 Hypertensive heart disease with heart failure: Secondary | ICD-10-CM | POA: Diagnosis not present

## 2019-04-20 DIAGNOSIS — S72001D Fracture of unspecified part of neck of right femur, subsequent encounter for closed fracture with routine healing: Secondary | ICD-10-CM | POA: Diagnosis not present

## 2019-04-20 DIAGNOSIS — S20221D Contusion of right back wall of thorax, subsequent encounter: Secondary | ICD-10-CM | POA: Diagnosis not present

## 2019-04-20 DIAGNOSIS — D62 Acute posthemorrhagic anemia: Secondary | ICD-10-CM | POA: Diagnosis not present

## 2019-04-20 DIAGNOSIS — I5022 Chronic systolic (congestive) heart failure: Secondary | ICD-10-CM | POA: Diagnosis not present

## 2019-04-20 DIAGNOSIS — M9701XD Periprosthetic fracture around internal prosthetic right hip joint, subsequent encounter: Secondary | ICD-10-CM | POA: Diagnosis not present

## 2019-04-20 NOTE — Progress Notes (Signed)
Subjective: Oscar Baker is seen today for follow up painful, elongated, thickened toenails 1-5 b/l feet that he cannot cut. Pain interferes with daily activities. Aggravating factor includes wearing enclosed shoe gear and relieved with periodic debridement.  Current Outpatient Medications on File Prior to Visit  Medication Sig  . methocarbamol (ROBAXIN) 500 MG tablet Robaxin 500 mg tablet   500 mg by oral route.  [name] acetaminophen (TYLENOL) 325 MG tablet Take 2 tablets (650 mg total) by mouth every 4 (four) hours as needed for mild pain.  [name] atorvastatin (LIPITOR) 20 MG tablet TAKE ONE TABLET EACH DAY AT 6PM (Patient taking differently: Take 20 mg by mouth daily at 6 PM. )  . carvedilol (COREG) 3.125 MG tablet TAKE ONE TABLET TWICE DAILY (Patient taking differently: Take 3.125 mg by mouth 2 (two) times daily with a meal. )  . fenofibrate micronized (LOFIBRA) 200 MG capsule TAKE ONE CAPSULE EACH DAY BEFORE BREAKFAST (Patient taking differently: Take 200 mg by mouth daily before breakfast. )  . ferrous sulfate 325 (65 FE) MG tablet Take 1 tablet (325 mg total) by mouth daily with lunch.  . furosemide (LASIX) 20 MG tablet Take 1 tablet (20 mg total) by mouth daily.  [name] gabapentin (NEURONTIN) 300 MG capsule Take 1 capsule (300 mg total) by mouth 3 (three) times daily.  [name] guaiFENesin (MUCINEX) 600 MG 12 hr tablet Take 600 mg by mouth 2 (two) times daily as needed for cough.   . hydrocortisone 2.5 % cream Apply topically 2 (two) times daily. to affected area  . ipratropium (ATROVENT) 0.02 % nebulizer solution TAKE 2.61mL BY NEBULIZER 3 TIMES DAILY (Patient taking differently: Take 0.5 mg by nebulization 3 (three) times daily. )  . ketoconazole (NIZORAL) 2 % shampoo 5 APPLICATIONS TO SCALP 2 TO 3 TIMES A WEEK  . loratadine (CLARITIN) 10 MG tablet Take 10 mg by mouth daily.  . metFORMIN (GLUCOPHAGE) 850 MG tablet Take 1 tablet (850 mg total) by mouth 2 (two) times daily with a meal.  . Multiple Vitamin  (MULTIVITAMIN WITH MINERALS) TABS tablet Take 1 tablet by mouth daily.  . Nebulizers (VIOS LC SPRINT DELUXE) MISC Use 1 each every 6 (six) hours as needed (shortness of breath or wheezing) Fax to [facility]  . polyethylene glycol (MIRALAX / GLYCOLAX) 17 g packet Take 17 g by mouth daily.  . traMADol (ULTRAM) 50 MG tablet Take 1 tablet (50 mg total) by mouth every 12 (twelve) hours as needed for severe pain.  . vitamin B-12 (CYANOCOBALAMIN) 1000 MCG tablet Take 1,000 mcg by mouth daily.   [name] warfarin (COUMADIN) 5 MG tablet TAKE AS DIRECTED BY COUMADIN CLINIC (Patient taking differently: Take 5-7.5 mg by mouth See admin instructions. Take 1 and 1/2 tablets on Saturday and Tuesday then take 1 tablet all the other days)   No current facility-administered medications on file prior to visit.      Allergies  Allergen Reactions  . Altace [Ramipril] Other (See Comments)    "throat felt like had a knot in it"  . Augmentin [Amoxicillin-Pot Clavulanate] Diarrhea    severe  . Codeine Nausea And Vomiting    Nausea and vomiting   . Simvastatin Other (See Comments)    Leg aches    Objective:  Vascular Examination: Capillary refill time <3 seconds x 10 digits.  Dorsalis pedis present b/l.  Posterior tibial pulses present b/l.  Digital hair sparse b/l.  Skin temperature gradient WNL b/l.   Dermatological Examination: Skin with normal  turgor, texture and tone b/l  Toenails 1-5 b/l discolored, thick, dystrophic with subungual debris and pain with palpation to nailbeds due to thickness of nails.  Musculoskeletal: Muscle strength 5/5 to all LE muscle groups  Contracted digits 2-5 b/l.  No pain, crepitus or joint limitation noted with ROM.   Neurological Examination: Protective sensation intact with 10 gram monofilament bilaterally.  Epicritic sensation present bilaterally.  Vibratory sensation intact bilaterally.   Assessment: Painful onychomycosis toenails 1-5 b/l  Hammertoes  b/l  Plan: 1. Toenails 1-5 b/l were debrided in length and girth without iatrogenic bleeding. 2. Patient to continue soft, supportive shoe gear 3. Patient to report any pedal injuries to medical professional immediately. 4. Follow up 3 months.  5. Patient/POA to call should there be a concern in the interim.

## 2019-04-21 DIAGNOSIS — C61 Malignant neoplasm of prostate: Secondary | ICD-10-CM | POA: Diagnosis not present

## 2019-04-24 ENCOUNTER — Encounter: Payer: Medicare Other | Attending: Registered Nurse | Admitting: Registered Nurse

## 2019-04-24 ENCOUNTER — Other Ambulatory Visit: Payer: Self-pay

## 2019-04-24 VITALS — BP 121/69 | HR 67 | Temp 98.3°F | Ht 70.0 in | Wt 200.0 lb

## 2019-04-24 DIAGNOSIS — M9701XA Periprosthetic fracture around internal prosthetic right hip joint, initial encounter: Secondary | ICD-10-CM | POA: Diagnosis not present

## 2019-04-24 DIAGNOSIS — I5022 Chronic systolic (congestive) heart failure: Secondary | ICD-10-CM

## 2019-04-24 DIAGNOSIS — I480 Paroxysmal atrial fibrillation: Secondary | ICD-10-CM

## 2019-04-24 NOTE — Progress Notes (Signed)
Subjective:    Patient ID: Oscar Baker, male    DOB: 11/24/1932, 85 y.o.   MRN: [id number]  HPI: Oscar Baker is a 85 y.o. male who is here for transitional care visit for follow up of his periprosthetic fracture around internal prosthetic right hip joint, PAF and chronic systolic congestive heart failure.  He presented to [facility] on 03/10/2019, after he had fallen backwards  in his home.  DG Right Hip:  IMPRESSION: Minimally displaced periprosthetic proximal femoral fracture. CT Abdomen:  IMPRESSION: Periprosthetic fracture in the proximal right femur.  Cholelithiasis without complicating factors.  Chronic changes as described above.  Orthopedic was consulted: Mr. Baiz underwent  By Dr. Alvan Dame on 03/15/2019.  OPEN REDUCTION INTERNAL FIXATION (ORIF) PERIPROSTHETIC FRACTURE WITH ZIMMER CABLES Right  He was admitted to inpatient rehabilitation on 03/20/2019 and discharged home on 04/11/2019. He is receiving outpatient therapy with [facility]. He denies pain. He rated his pain 0. Also reports he has a good appetite.   Wife in room, all questions answered.   Pain Inventory Average Pain 0 Pain Right Now 0 My pain is na  In the last 24 hours, has pain interfered with the following? General activity 0 Relation with others 0 Enjoyment of life 0 What TIME of day is your pain at its worst? na Sleep (in general) Fair  Pain is worse with: na Pain improves with: na Relief from Meds: na  Mobility walk with assistance use a walker ability to climb steps?  no do you drive?  no  Function retired  Neuro/Psych bladder control problems trouble walking  Prior Studies Any changes since last visit?  no  Physicians involved in your care Any changes since last visit?  no   Family History  Problem Relation Age of Onset  . Emphysema Mother   . Allergic rhinitis Mother   . Heart attack Father   . Allergic rhinitis Brother   . Pulmonary fibrosis  Brother    Social History   Socioeconomic History  . Marital status: Married    Spouse name: Not on file  . Number of children: Not on file  . Years of education: Not on file  . Highest education level: Not on file  Occupational History  . Not on file  Social Needs  . Financial resource strain: Not on file  . Food insecurity    Worry: Not on file    Inability: Not on file  . Transportation needs    Medical: Not on file    Non-medical: Not on file  Tobacco Use  . Smoking status: Former Smoker    Years: 25.00    Types: Pipe, Cigars    Quit date: 11/25/1975    Years since quitting: 43.4  . Smokeless tobacco: Never Used  Substance and Sexual Activity  . Alcohol use: Yes    Alcohol/week: 0.0 standard drinks    Comment: occasional  . Drug use: No  . Sexual activity: Not on file  Lifestyle  . Physical activity    Days per week: Not on file    Minutes per session: Not on file  . Stress: Not on file  Relationships  . Social Herbalist on phone: Not on file    Gets together: Not on file    Attends religious service: Not on file    Active member of club or organization: Not on file    Attends meetings of clubs or organizations: Not on file  Relationship status: Not on file  Other Topics Concern  . Not on file  Social History Narrative  . Not on file   Past Surgical History:  Procedure Laterality Date  . APPENDECTOMY    . BACK SURGERY     disk  . CARDIAC CATHETERIZATION  09-03-1999  dr Cathie Olden   abnormal cardiolite study:  minor luminal irregularities but no critial coronary artery stenosis  . CARDIOVASCULAR STRESS TEST  08-18-2017  dr Cathie Olden   Intermediate risk nuclear study w/ large area inferior infarction, no evidence of ishcemia (consistant w/ prior MI)/  study not gated due to frequent PVCs  . CARPAL TUNNEL RELEASE Right 2000  . CARPAL TUNNEL RELEASE Left 11/19/2009  . CATARACT EXTRACTION Right 07/2015  . CATARACT EXTRACTION Left 09/2015  .  CYSTOSCOPY N/A 11/04/2017   Procedure: CYSTOSCOPY AND CAUTERIZATION OF BLADDER;  Surgeon: Franchot Gallo, MD;  Location: [facility];  Service: Urology;  Laterality: N/A;  . INSERTION PENILE PROSTHESIS  02-22-2004    dr Mattie Marlin  [facility]  . IR THORACENTESIS ASP PLEURAL SPACE W/IMG GUIDE  11/26/2017  . KNEE ARTHROSCOPY Left 07/2010  . ORIF PERIPROSTHETIC FRACTURE Right 03/15/2019   Procedure: OPEN REDUCTION INTERNAL FIXATION (ORIF) PERIPROSTHETIC FRACTURE WITH ZIMMER CABLES;  Surgeon: Paralee Cancel, MD;  Location: [place];  Service: Orthopedics;  Laterality: Right;  . PERMANENT PACEMAKER INSERTION N/A 02/08/2014   Procedure: PERMANENT PACEMAKER INSERTION;  Surgeon: Evans Lance, MD;  Location: [facility] CATH LAB;  Service: Cardiovascular;  Laterality: N/A;  . PLEURAL EFFUSION DRAINAGE Left 11/30/2017   Procedure: DRAINAGE OF HEMOTHORAX;  Surgeon: Ivin Poot, MD;  Location: [place];  Service: Thoracic;  Laterality: Left;  . RADICAL RETROPUBIC PROSTATECTOMY W/ BILATERAL PELVIC LYMPH NODE DISSECTION  11-28-2002   dr Mattie Marlin  [facility]  . TONSILLECTOMY    . TOTAL HIP ARTHROPLASTY Left 05/12/2016   Procedure: LEFT TOTAL HIP ARTHROPLASTY ANTERIOR APPROACH;  Surgeon: Paralee Cancel, MD;  Location: [facility];  Service: Orthopedics;  Laterality: Left;  . TOTAL HIP ARTHROPLASTY Right 07-15-2006   dr Alvan Dame  [facility]  . TRANSTHORACIC ECHOCARDIOGRAM  07/20/2017   mild LVH, ef 45-50%, hypokinesis of the basal-midinferior myocardium, due to AFib unable to evaluate diastolic function/  severe LAE and RAE/  trivial PR and TR  . VIDEO ASSISTED THORACOSCOPY Left 11/30/2017   Procedure: VIDEO ASSISTED THORACOSCOPY;  Surgeon: Ivin Poot, MD;  Location: [place];  Service: Thoracic;  Laterality: Left;  [name] VIDEO ASSISTED THORACOSCOPY (VATS)/EMPYEMA Left 11/29/2017   Procedure: VIDEO ASSISTED THORACOSCOPY (VATS)/EMPYEMA;  Surgeon: Ivin Poot, MD;  Location: [place];  Service: Thoracic;  Laterality: Left;   Past Medical  History:  Diagnosis Date  . Asthma 1950's   history of  . Atrial fibrillation ([place])   . Bilateral carpal tunnel syndrome   . Bilateral lower extremity edema   . Bladder tumor   . Chronic systolic heart failure (HCC)    Echo 1/19: Mild LVH, EF 45-50, inf HK, MAC, severe LAE, severe RAE // Echo 7/15: Mild LVH, mod focal basal sept hypertrophy, EF 55-60, AV peak and mean 16/9, trivial MR, mod LAE, PASP 38  . CLL (chronic lymphocytic leukemia) [facility]) oncologist-  dr Ilene Qua--   dx [phone] ;  Lymphocytosis, CLL - per lov note 05-11-2017 currently under active survillance,  CT 04-17-2014 show very small lymphadenopathy, no indication for treatment  . Coronary artery disease    cardiologist-  dr Cathie Olden--  08-18-2017 Intermittant risk nuclear study w/  large area of inferior infartion with no evidence ishcemia   . 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 Diabetes mellitus type 2, noninsulin dependent ([place])   . Elevated PSA    since prostatectomy but now resolved  . Hematuria 04/2017  . History of ear infection    Right  . History of MI (myocardial infarction)    per myoview nuclear study 08-18-2017 , unknown when  . History of shingles 08/2017   L ear and scalp, possible  . Hyperlipidemia   . Hypertension   . Ischemic cardiomyopathy 09/01/2017   Presumed +CAD with Nuclear stress test 08/18/17 - Inferior scar, no ischemia, intermediate risk // med management unless +angina or worse dyspnea  . OA (osteoarthritis)   . Pacemaker 02/08/2014   followed by dr g. taylor--  single chamber Biotronik due to SSS  . Permanent atrial fibrillation ([place])   . Pneumonia 2019   Left lung  . Prostate cancer [facility]) urologist-  dr Diona Fanti   dx 2004--  Gleason 8, PSA 10.45--  11-28-2002  s/p  radical prostatectomy;  recurrent w/ increasing PSA, started ADT treatment  . RBBB (right bundle branch block)   . Sick sinus syndrome ([place])    a-Flutter with episodes of bradycardia; S/P Biotronik (serial number [id number]) 02-08-2014  .  Urinary incontinence   . Wears hearing aid in right ear    receiver and transmitter   BP 121/69   Pulse 67   Temp 98.3 F (36.8 C)   Ht _0  (1.778 m)   Wt 200 lb (90.7 kg)   SpO2 94%   BMI 28.70 kg/m   Opioid Risk Score:   Fall Risk Score:  `1  Depression screen PHQ 2/9  No flowsheet data found.   Review of Systems  Constitutional: Negative.   HENT: Negative.   Eyes: Negative.   Respiratory: Positive for choking and wheezing.   Cardiovascular: Negative.   Gastrointestinal: Negative.   Endocrine: Negative.   Genitourinary: Positive for difficulty urinating.  Musculoskeletal: Positive for gait problem.  Skin: Negative.   Allergic/Immunologic: Negative.   Hematological: Negative.   Psychiatric/Behavioral: Negative.   All other systems reviewed and are negative.      Objective:   Physical Exam Vitals signs and nursing note reviewed.  Constitutional:      Appearance: Normal appearance.  Neck:     Musculoskeletal: Normal range of motion and neck supple.  Cardiovascular:     Rate and Rhythm: Normal rate and regular rhythm.     Pulses: Normal pulses.     Heart sounds: Normal heart sounds.  Pulmonary:     Effort: Pulmonary effort is normal.     Breath sounds: Normal breath sounds.  Musculoskeletal:     Right lower leg: Edema present.     Left lower leg: Edema present.     Comments: Normal Muscle Bulk and Muscle Testing Reveals:  Upper Extremities: Full ROM and Muscle Strength 5/5  Lower Extremities: Full ROM and Muscle Strength 5/5 Arises from table with ease using walker for support Narrow Based  Gait   Skin:    General: Skin is warm and dry.  Neurological:     Mental Status: He is alert and oriented to person, place, and time.  Psychiatric:        Mood and Affect: Mood normal.        Behavior: Behavior normal.           Assessment & Plan:  1Pperiprosthetic fracture around internal prosthetic right  hip joint: S/p Right ORIF on 03/15/2019 by Dr.  Alvan Dame. Continue Outpatient Therapy. Dr. Alvan Dame Following.  2. PAF: Continue current medication regimen. PCP Following.  3.chronic systolic congestive heart failure: Continue current medication regimen. PCP Following.   20 minutes of face to face patient care time was spent during this visit. All questions were encouraged and answered.  Mr. Schnelle Refuses to return for F/U appointment.

## 2019-04-25 ENCOUNTER — Ambulatory Visit (INDEPENDENT_AMBULATORY_CARE_PROVIDER_SITE_OTHER): Payer: Medicare Other | Admitting: Cardiology

## 2019-04-25 DIAGNOSIS — I110 Hypertensive heart disease with heart failure: Secondary | ICD-10-CM | POA: Diagnosis not present

## 2019-04-25 DIAGNOSIS — D62 Acute posthemorrhagic anemia: Secondary | ICD-10-CM | POA: Diagnosis not present

## 2019-04-25 DIAGNOSIS — I480 Paroxysmal atrial fibrillation: Secondary | ICD-10-CM | POA: Diagnosis not present

## 2019-04-25 DIAGNOSIS — I5022 Chronic systolic (congestive) heart failure: Secondary | ICD-10-CM | POA: Diagnosis not present

## 2019-04-25 DIAGNOSIS — Z5181 Encounter for therapeutic drug level monitoring: Secondary | ICD-10-CM | POA: Diagnosis not present

## 2019-04-25 DIAGNOSIS — M9701XD Periprosthetic fracture around internal prosthetic right hip joint, subsequent encounter: Secondary | ICD-10-CM | POA: Diagnosis not present

## 2019-04-25 DIAGNOSIS — S20221D Contusion of right back wall of thorax, subsequent encounter: Secondary | ICD-10-CM | POA: Diagnosis not present

## 2019-04-25 DIAGNOSIS — S72001D Fracture of unspecified part of neck of right femur, subsequent encounter for closed fracture with routine healing: Secondary | ICD-10-CM | POA: Diagnosis not present

## 2019-04-25 LAB — POCT INR: INR: 4.3 — AB (ref 2.0–3.0)

## 2019-04-25 NOTE — Patient Instructions (Signed)
Description   Spoke to [place] with Alvis Lemmings and instructed pt to hold today's dose and take 1/2 tablet tomorrow then start taking 1 tablet daily except 1.5 tablets on Saturdays. Recheck in 1 week-d/c from [facility].  Call Coumadin Clinic [phone] with any concerns.

## 2019-04-26 DIAGNOSIS — Z8551 Personal history of malignant neoplasm of bladder: Secondary | ICD-10-CM

## 2019-04-26 DIAGNOSIS — C9110 Chronic lymphocytic leukemia of B-cell type not having achieved remission: Secondary | ICD-10-CM | POA: Diagnosis not present

## 2019-04-26 DIAGNOSIS — I252 Old myocardial infarction: Secondary | ICD-10-CM

## 2019-04-26 DIAGNOSIS — S20221D Contusion of right back wall of thorax, subsequent encounter: Secondary | ICD-10-CM | POA: Diagnosis not present

## 2019-04-26 DIAGNOSIS — Z87891 Personal history of nicotine dependence: Secondary | ICD-10-CM

## 2019-04-26 DIAGNOSIS — Z9181 History of falling: Secondary | ICD-10-CM

## 2019-04-26 DIAGNOSIS — M9701XD Periprosthetic fracture around internal prosthetic right hip joint, subsequent encounter: Secondary | ICD-10-CM | POA: Diagnosis not present

## 2019-04-26 DIAGNOSIS — Z8546 Personal history of malignant neoplasm of prostate: Secondary | ICD-10-CM

## 2019-04-26 DIAGNOSIS — R05 Cough: Secondary | ICD-10-CM

## 2019-04-26 DIAGNOSIS — Z7901 Long term (current) use of anticoagulants: Secondary | ICD-10-CM

## 2019-04-26 DIAGNOSIS — I5022 Chronic systolic (congestive) heart failure: Secondary | ICD-10-CM | POA: Diagnosis not present

## 2019-04-26 DIAGNOSIS — I110 Hypertensive heart disease with heart failure: Secondary | ICD-10-CM | POA: Diagnosis not present

## 2019-04-26 DIAGNOSIS — M1990 Unspecified osteoarthritis, unspecified site: Secondary | ICD-10-CM

## 2019-04-26 DIAGNOSIS — Z8701 Personal history of pneumonia (recurrent): Secondary | ICD-10-CM

## 2019-04-26 DIAGNOSIS — W19XXXD Unspecified fall, subsequent encounter: Secondary | ICD-10-CM

## 2019-04-26 DIAGNOSIS — I2510 Atherosclerotic heart disease of native coronary artery without angina pectoris: Secondary | ICD-10-CM | POA: Diagnosis not present

## 2019-04-26 DIAGNOSIS — E785 Hyperlipidemia, unspecified: Secondary | ICD-10-CM

## 2019-04-26 DIAGNOSIS — R32 Unspecified urinary incontinence: Secondary | ICD-10-CM

## 2019-04-26 DIAGNOSIS — I480 Paroxysmal atrial fibrillation: Secondary | ICD-10-CM | POA: Diagnosis not present

## 2019-04-26 DIAGNOSIS — Z7984 Long term (current) use of oral hypoglycemic drugs: Secondary | ICD-10-CM

## 2019-04-26 DIAGNOSIS — I4510 Unspecified right bundle-branch block: Secondary | ICD-10-CM

## 2019-04-26 DIAGNOSIS — D62 Acute posthemorrhagic anemia: Secondary | ICD-10-CM | POA: Diagnosis not present

## 2019-04-26 DIAGNOSIS — H6691 Otitis media, unspecified, right ear: Secondary | ICD-10-CM

## 2019-04-26 DIAGNOSIS — Z9079 Acquired absence of other genital organ(s): Secondary | ICD-10-CM

## 2019-04-26 DIAGNOSIS — I495 Sick sinus syndrome: Secondary | ICD-10-CM | POA: Diagnosis not present

## 2019-04-26 DIAGNOSIS — E119 Type 2 diabetes mellitus without complications: Secondary | ICD-10-CM

## 2019-04-26 DIAGNOSIS — I255 Ischemic cardiomyopathy: Secondary | ICD-10-CM | POA: Diagnosis not present

## 2019-04-26 DIAGNOSIS — H918X1 Other specified hearing loss, right ear: Secondary | ICD-10-CM

## 2019-04-26 DIAGNOSIS — Z950 Presence of cardiac pacemaker: Secondary | ICD-10-CM

## 2019-04-26 DIAGNOSIS — S72001D Fracture of unspecified part of neck of right femur, subsequent encounter for closed fracture with routine healing: Secondary | ICD-10-CM | POA: Diagnosis not present

## 2019-04-27 DIAGNOSIS — S20221D Contusion of right back wall of thorax, subsequent encounter: Secondary | ICD-10-CM | POA: Diagnosis not present

## 2019-04-27 DIAGNOSIS — I5022 Chronic systolic (congestive) heart failure: Secondary | ICD-10-CM | POA: Diagnosis not present

## 2019-04-27 DIAGNOSIS — M9701XD Periprosthetic fracture around internal prosthetic right hip joint, subsequent encounter: Secondary | ICD-10-CM | POA: Diagnosis not present

## 2019-04-27 DIAGNOSIS — S72001D Fracture of unspecified part of neck of right femur, subsequent encounter for closed fracture with routine healing: Secondary | ICD-10-CM | POA: Diagnosis not present

## 2019-04-27 DIAGNOSIS — I110 Hypertensive heart disease with heart failure: Secondary | ICD-10-CM | POA: Diagnosis not present

## 2019-04-27 DIAGNOSIS — D62 Acute posthemorrhagic anemia: Secondary | ICD-10-CM | POA: Diagnosis not present

## 2019-04-28 ENCOUNTER — Encounter: Payer: Self-pay | Admitting: Registered Nurse

## 2019-05-01 NOTE — Progress Notes (Signed)
Remote pacemaker transmission.   

## 2019-05-02 DIAGNOSIS — S72001D Fracture of unspecified part of neck of right femur, subsequent encounter for closed fracture with routine healing: Secondary | ICD-10-CM | POA: Diagnosis not present

## 2019-05-02 DIAGNOSIS — I110 Hypertensive heart disease with heart failure: Secondary | ICD-10-CM | POA: Diagnosis not present

## 2019-05-02 DIAGNOSIS — D62 Acute posthemorrhagic anemia: Secondary | ICD-10-CM | POA: Diagnosis not present

## 2019-05-02 DIAGNOSIS — I5022 Chronic systolic (congestive) heart failure: Secondary | ICD-10-CM | POA: Diagnosis not present

## 2019-05-02 DIAGNOSIS — S20221D Contusion of right back wall of thorax, subsequent encounter: Secondary | ICD-10-CM | POA: Diagnosis not present

## 2019-05-02 DIAGNOSIS — M9701XD Periprosthetic fracture around internal prosthetic right hip joint, subsequent encounter: Secondary | ICD-10-CM | POA: Diagnosis not present

## 2019-05-04 ENCOUNTER — Other Ambulatory Visit: Payer: Self-pay

## 2019-05-04 ENCOUNTER — Ambulatory Visit (INDEPENDENT_AMBULATORY_CARE_PROVIDER_SITE_OTHER): Payer: Medicare Other | Admitting: *Deleted

## 2019-05-04 DIAGNOSIS — Z5181 Encounter for therapeutic drug level monitoring: Secondary | ICD-10-CM

## 2019-05-04 DIAGNOSIS — I4821 Permanent atrial fibrillation: Secondary | ICD-10-CM | POA: Diagnosis not present

## 2019-05-04 LAB — POCT INR: INR: 1.8 — AB (ref 2.0–3.0)

## 2019-05-04 NOTE — Patient Instructions (Signed)
Description   Take 1.5 tablets today and then continue taking 1 tablet daily except 1.5 tablets on Saturdays. Recheck in 1 week.  Call Coumadin Clinic [phone] with any concerns.

## 2019-05-05 DIAGNOSIS — M9701XD Periprosthetic fracture around internal prosthetic right hip joint, subsequent encounter: Secondary | ICD-10-CM | POA: Diagnosis not present

## 2019-05-05 DIAGNOSIS — S72001D Fracture of unspecified part of neck of right femur, subsequent encounter for closed fracture with routine healing: Secondary | ICD-10-CM | POA: Diagnosis not present

## 2019-05-05 DIAGNOSIS — I5022 Chronic systolic (congestive) heart failure: Secondary | ICD-10-CM | POA: Diagnosis not present

## 2019-05-05 DIAGNOSIS — S20221D Contusion of right back wall of thorax, subsequent encounter: Secondary | ICD-10-CM | POA: Diagnosis not present

## 2019-05-05 DIAGNOSIS — I110 Hypertensive heart disease with heart failure: Secondary | ICD-10-CM | POA: Diagnosis not present

## 2019-05-05 DIAGNOSIS — D62 Acute posthemorrhagic anemia: Secondary | ICD-10-CM | POA: Diagnosis not present

## 2019-05-09 DIAGNOSIS — S20221D Contusion of right back wall of thorax, subsequent encounter: Secondary | ICD-10-CM | POA: Diagnosis not present

## 2019-05-09 DIAGNOSIS — S72001D Fracture of unspecified part of neck of right femur, subsequent encounter for closed fracture with routine healing: Secondary | ICD-10-CM | POA: Diagnosis not present

## 2019-05-09 DIAGNOSIS — M9701XD Periprosthetic fracture around internal prosthetic right hip joint, subsequent encounter: Secondary | ICD-10-CM | POA: Diagnosis not present

## 2019-05-09 DIAGNOSIS — I5022 Chronic systolic (congestive) heart failure: Secondary | ICD-10-CM | POA: Diagnosis not present

## 2019-05-09 DIAGNOSIS — D62 Acute posthemorrhagic anemia: Secondary | ICD-10-CM | POA: Diagnosis not present

## 2019-05-09 DIAGNOSIS — I110 Hypertensive heart disease with heart failure: Secondary | ICD-10-CM | POA: Diagnosis not present

## 2019-05-11 ENCOUNTER — Other Ambulatory Visit: Payer: Self-pay | Admitting: Internal Medicine

## 2019-05-11 ENCOUNTER — Other Ambulatory Visit: Payer: Self-pay | Admitting: Physical Medicine and Rehabilitation

## 2019-05-11 ENCOUNTER — Other Ambulatory Visit: Payer: Self-pay | Admitting: Cardiovascular Disease

## 2019-05-11 DIAGNOSIS — D62 Acute posthemorrhagic anemia: Secondary | ICD-10-CM | POA: Diagnosis not present

## 2019-05-11 DIAGNOSIS — M9701XD Periprosthetic fracture around internal prosthetic right hip joint, subsequent encounter: Secondary | ICD-10-CM | POA: Diagnosis not present

## 2019-05-11 DIAGNOSIS — S72001D Fracture of unspecified part of neck of right femur, subsequent encounter for closed fracture with routine healing: Secondary | ICD-10-CM | POA: Diagnosis not present

## 2019-05-11 DIAGNOSIS — I5022 Chronic systolic (congestive) heart failure: Secondary | ICD-10-CM | POA: Diagnosis not present

## 2019-05-11 DIAGNOSIS — C9110 Chronic lymphocytic leukemia of B-cell type not having achieved remission: Secondary | ICD-10-CM

## 2019-05-11 DIAGNOSIS — I110 Hypertensive heart disease with heart failure: Secondary | ICD-10-CM | POA: Diagnosis not present

## 2019-05-11 DIAGNOSIS — L219 Seborrheic dermatitis, unspecified: Secondary | ICD-10-CM | POA: Diagnosis not present

## 2019-05-11 DIAGNOSIS — S20221D Contusion of right back wall of thorax, subsequent encounter: Secondary | ICD-10-CM | POA: Diagnosis not present

## 2019-05-12 DIAGNOSIS — Z471 Aftercare following joint replacement surgery: Secondary | ICD-10-CM | POA: Diagnosis not present

## 2019-05-12 DIAGNOSIS — Z96641 Presence of right artificial hip joint: Secondary | ICD-10-CM | POA: Diagnosis not present

## 2019-05-13 DIAGNOSIS — M1990 Unspecified osteoarthritis, unspecified site: Secondary | ICD-10-CM | POA: Diagnosis not present

## 2019-05-13 DIAGNOSIS — I110 Hypertensive heart disease with heart failure: Secondary | ICD-10-CM | POA: Diagnosis not present

## 2019-05-13 DIAGNOSIS — C9110 Chronic lymphocytic leukemia of B-cell type not having achieved remission: Secondary | ICD-10-CM | POA: Diagnosis not present

## 2019-05-13 DIAGNOSIS — Z8551 Personal history of malignant neoplasm of bladder: Secondary | ICD-10-CM | POA: Diagnosis not present

## 2019-05-13 DIAGNOSIS — I480 Paroxysmal atrial fibrillation: Secondary | ICD-10-CM | POA: Diagnosis not present

## 2019-05-13 DIAGNOSIS — E119 Type 2 diabetes mellitus without complications: Secondary | ICD-10-CM | POA: Diagnosis not present

## 2019-05-13 DIAGNOSIS — M9701XD Periprosthetic fracture around internal prosthetic right hip joint, subsequent encounter: Secondary | ICD-10-CM | POA: Diagnosis not present

## 2019-05-13 DIAGNOSIS — I2510 Atherosclerotic heart disease of native coronary artery without angina pectoris: Secondary | ICD-10-CM | POA: Diagnosis not present

## 2019-05-13 DIAGNOSIS — I255 Ischemic cardiomyopathy: Secondary | ICD-10-CM | POA: Diagnosis not present

## 2019-05-13 DIAGNOSIS — D62 Acute posthemorrhagic anemia: Secondary | ICD-10-CM | POA: Diagnosis not present

## 2019-05-13 DIAGNOSIS — Z950 Presence of cardiac pacemaker: Secondary | ICD-10-CM | POA: Diagnosis not present

## 2019-05-13 DIAGNOSIS — E785 Hyperlipidemia, unspecified: Secondary | ICD-10-CM | POA: Diagnosis not present

## 2019-05-13 DIAGNOSIS — R05 Cough: Secondary | ICD-10-CM | POA: Diagnosis not present

## 2019-05-13 DIAGNOSIS — S72001D Fracture of unspecified part of neck of right femur, subsequent encounter for closed fracture with routine healing: Secondary | ICD-10-CM | POA: Diagnosis not present

## 2019-05-13 DIAGNOSIS — H6691 Otitis media, unspecified, right ear: Secondary | ICD-10-CM | POA: Diagnosis not present

## 2019-05-13 DIAGNOSIS — I4510 Unspecified right bundle-branch block: Secondary | ICD-10-CM | POA: Diagnosis not present

## 2019-05-13 DIAGNOSIS — I495 Sick sinus syndrome: Secondary | ICD-10-CM | POA: Diagnosis not present

## 2019-05-13 DIAGNOSIS — Z8701 Personal history of pneumonia (recurrent): Secondary | ICD-10-CM | POA: Diagnosis not present

## 2019-05-13 DIAGNOSIS — R32 Unspecified urinary incontinence: Secondary | ICD-10-CM | POA: Diagnosis not present

## 2019-05-13 DIAGNOSIS — Z7901 Long term (current) use of anticoagulants: Secondary | ICD-10-CM | POA: Diagnosis not present

## 2019-05-13 DIAGNOSIS — I5022 Chronic systolic (congestive) heart failure: Secondary | ICD-10-CM | POA: Diagnosis not present

## 2019-05-13 DIAGNOSIS — I252 Old myocardial infarction: Secondary | ICD-10-CM | POA: Diagnosis not present

## 2019-05-13 DIAGNOSIS — H918X1 Other specified hearing loss, right ear: Secondary | ICD-10-CM | POA: Diagnosis not present

## 2019-05-13 DIAGNOSIS — Z7984 Long term (current) use of oral hypoglycemic drugs: Secondary | ICD-10-CM | POA: Diagnosis not present

## 2019-05-13 DIAGNOSIS — S20221D Contusion of right back wall of thorax, subsequent encounter: Secondary | ICD-10-CM | POA: Diagnosis not present

## 2019-05-15 ENCOUNTER — Telehealth: Payer: Self-pay | Admitting: Oncology

## 2019-05-15 ENCOUNTER — Ambulatory Visit (INDEPENDENT_AMBULATORY_CARE_PROVIDER_SITE_OTHER): Payer: Medicare Other | Admitting: *Deleted

## 2019-05-15 ENCOUNTER — Other Ambulatory Visit: Payer: Self-pay

## 2019-05-15 DIAGNOSIS — I4821 Permanent atrial fibrillation: Secondary | ICD-10-CM

## 2019-05-15 DIAGNOSIS — Z5181 Encounter for therapeutic drug level monitoring: Secondary | ICD-10-CM | POA: Diagnosis not present

## 2019-05-15 LAB — POCT INR: INR: 2.7 (ref 2.0–3.0)

## 2019-05-15 NOTE — Telephone Encounter (Signed)
Called patient regarding 11/27 appointment, informed patient this will be a phone visit instead of virtual visit since patient does not have access.

## 2019-05-15 NOTE — Telephone Encounter (Signed)
Called per providers request, informed patient 11/27 appointment has been switched to a virtual visit. Labs cancelled.

## 2019-05-15 NOTE — Patient Instructions (Signed)
Description   Continue taking 1 tablet daily except 1.5 tablets on Saturdays. Recheck in 2 week.  Call Coumadin Clinic [phone] with any concerns.

## 2019-05-16 DIAGNOSIS — S20221D Contusion of right back wall of thorax, subsequent encounter: Secondary | ICD-10-CM | POA: Diagnosis not present

## 2019-05-16 DIAGNOSIS — I5022 Chronic systolic (congestive) heart failure: Secondary | ICD-10-CM | POA: Diagnosis not present

## 2019-05-16 DIAGNOSIS — S72001D Fracture of unspecified part of neck of right femur, subsequent encounter for closed fracture with routine healing: Secondary | ICD-10-CM | POA: Diagnosis not present

## 2019-05-16 DIAGNOSIS — I110 Hypertensive heart disease with heart failure: Secondary | ICD-10-CM | POA: Diagnosis not present

## 2019-05-16 DIAGNOSIS — M9701XD Periprosthetic fracture around internal prosthetic right hip joint, subsequent encounter: Secondary | ICD-10-CM | POA: Diagnosis not present

## 2019-05-16 DIAGNOSIS — D62 Acute posthemorrhagic anemia: Secondary | ICD-10-CM | POA: Diagnosis not present

## 2019-05-17 DIAGNOSIS — E7849 Other hyperlipidemia: Secondary | ICD-10-CM | POA: Diagnosis not present

## 2019-05-17 DIAGNOSIS — E1122 Type 2 diabetes mellitus with diabetic chronic kidney disease: Secondary | ICD-10-CM | POA: Diagnosis not present

## 2019-05-19 ENCOUNTER — Inpatient Hospital Stay: Payer: Medicare Other

## 2019-05-19 ENCOUNTER — Inpatient Hospital Stay: Payer: Medicare Other | Attending: Oncology | Admitting: Oncology

## 2019-05-19 ENCOUNTER — Inpatient Hospital Stay: Payer: Medicare Other | Admitting: Oncology

## 2019-05-19 ENCOUNTER — Telehealth: Payer: Medicare Other | Admitting: Oncology

## 2019-05-19 DIAGNOSIS — R82998 Other abnormal findings in urine: Secondary | ICD-10-CM | POA: Diagnosis not present

## 2019-05-19 DIAGNOSIS — I10 Essential (primary) hypertension: Secondary | ICD-10-CM | POA: Diagnosis not present

## 2019-05-19 DIAGNOSIS — C9110 Chronic lymphocytic leukemia of B-cell type not having achieved remission: Secondary | ICD-10-CM

## 2019-05-19 NOTE — Progress Notes (Signed)
Hematology and Oncology Follow Up for Telemedicine Visits  Oscar Baker [id number] 07-29-1932 85 y.o. 05/19/2019 10:28 AM [name], [place], [name], MD   I connected with [name] on 05/19/19 at 10:30 AM EST by telephone visit and verified that I am speaking with the correct person using two identifiers.   I discussed the limitations, risks, security and privacy concerns of performing an evaluation and management service by telemedicine and the availability of in-person appointments. I also discussed with the patient that there may be a patient responsible charge related to this service. The patient expressed understanding and agreed to proceed.  Other persons participating in the visit and their role in the encounter:  None  Patient's location: Home Provider's location: Office   Principle Diagnosis: 85 year old man with:  1.    CLL presented with leukocytosis and no adenopathy in 2015.  He was found to have stage 0  disease.  2.    Biochemically recurrent prostate cancer in 2018.  He was initially diagnosed in 2004 with Gleason score 4+4 = 8 and status post prostatectomy  reviewed at that time.    Current therapy: Active surveillance.  Interim History: [name] reports of feeling better at this time and he is improving in his ambulation.  He has sustained a fall in July 2020 sustaining a hip fracture that required surgical fixation and required rehabilitation stay.  He has recovered reasonably well at this time and currently full weightbearing with the use of a walker.  He has not had any constitutional symptoms or decline in his appetite.  His performance status and quality of life remains maintained.     Medications: I have reviewed the patient's current medications.  Current Outpatient Medications  Medication Sig Dispense Refill  . acetaminophen (TYLENOL) 325 MG tablet Take 2 tablets (650 mg total) by mouth every 4 (four) hours as needed for mild pain.    [name]  atorvastatin (LIPITOR) 20 MG tablet TAKE ONE TABLET DAILY AT 6PM 90 tablet 0  . carvedilol (COREG) 3.125 MG tablet TAKE ONE TABLET TWICE DAILY (Patient taking differently: Take 3.125 mg by mouth 2 (two) times daily with a meal. ) 180 tablet 3  . fenofibrate micronized (LOFIBRA) 200 MG capsule TAKE ONE CAPSULE EACH DAY BEFORE BREAKFAST (Patient taking differently: Take 200 mg by mouth daily before breakfast. ) 90 capsule 2  . ferrous sulfate 325 (65 FE) MG tablet Take 1 tablet (325 mg total) by mouth daily with lunch. 30 tablet 0  . furosemide (LASIX) 20 MG tablet Take 1 tablet (20 mg total) by mouth daily.    [name] gabapentin (NEURONTIN) 300 MG capsule Take 1 capsule (300 mg total) by mouth 3 (three) times daily. 180 capsule 1  . guaiFENesin (MUCINEX) 600 MG 12 hr tablet Take 600 mg by mouth 2 (two) times daily as needed for cough.     . hydrocortisone 2.5 % cream Apply topically 2 (two) times daily. to affected area    . ipratropium (ATROVENT) 0.02 % nebulizer solution TAKE 2.62mL BY NEBULIZER 3 TIMES DAILY (Patient taking differently: Take 0.5 mg by nebulization 3 (three) times daily. ) 675 mL 0  . ketoconazole (NIZORAL) 2 % shampoo 5 APPLICATIONS TO SCALP 2 TO 3 TIMES A WEEK    . loratadine (CLARITIN) 10 MG tablet Take 10 mg by mouth daily.    . metFORMIN (GLUCOPHAGE) 850 MG tablet Take 1 tablet (850 mg total) by mouth 2 (two) times daily with a meal. 60 tablet 0  .  methocarbamol (ROBAXIN) 500 MG tablet Robaxin 500 mg tablet   500 mg by oral route.    . Multiple Vitamin (MULTIVITAMIN WITH MINERALS) TABS tablet Take 1 tablet by mouth daily.    . Nebulizers (VIOS LC SPRINT DELUXE) MISC Use 1 each every 6 (six) hours as needed (shortness of breath or wheezing) Fax to [facility]    . polyethylene glycol (MIRALAX / GLYCOLAX) 17 g packet Take 17 g by mouth daily. 14 each 0  . traMADol (ULTRAM) 50 MG tablet Take 1 tablet (50 mg total) by mouth every 12 (twelve) hours as needed for severe pain. 14 tablet 0  .  vitamin B-12 (CYANOCOBALAMIN) 1000 MCG tablet Take 1,000 mcg by mouth daily.     [name] warfarin (COUMADIN) 5 MG tablet TAKE AS DIRECTED BY COUMADIN CLINIC 120 tablet 0   No current facility-administered medications for this visit.      Allergies:  Allergies  Allergen Reactions  . Altace [Ramipril] Other (See Comments)    "throat felt like had a knot in it"  . Augmentin [Amoxicillin-Pot Clavulanate] Diarrhea    severe  . Codeine Nausea And Vomiting    Nausea and vomiting   . Simvastatin Other (See Comments)    Leg aches    Past Medical History, Surgical history, Social history, and Family History without any changes on review.      Lab Results: Lab Results  Component Value Date   WBC 25.9 (H) 04/10/2019   HGB 11.8 (L) 04/10/2019   HCT 35.6 (L) 04/10/2019   MCV 104.1 (H) 04/10/2019   PLT 293 04/10/2019     Chemistry      Component Value Date/Time   NA 138 04/10/2019 0904   NA 140 01/26/2018 1244   NA 139 05/11/2017 0831   K 4.1 04/10/2019 0904   K 4.6 05/11/2017 0831   CL 103 04/10/2019 0904   CO2 26 04/10/2019 0904   CO2 23 05/11/2017 0831   BUN 16 04/10/2019 0904   BUN 21 01/26/2018 1244   BUN 21.1 05/11/2017 0831   CREATININE 0.94 04/10/2019 0904   CREATININE 1.02 11/15/2018 0942   CREATININE 1.0 05/11/2017 0831      Component Value Date/Time   CALCIUM 9.3 04/10/2019 0904   CALCIUM 9.0 05/11/2017 0831   ALKPHOS 70 03/23/2019 0635   ALKPHOS 65 05/11/2017 0831   AST 27 03/23/2019 0635   AST 30 11/15/2018 0942   AST 26 05/11/2017 0831   ALT 13 03/23/2019 0635   ALT 19 11/15/2018 0942   ALT 19 05/11/2017 0831   BILITOT 1.0 03/23/2019 0635   BILITOT 0.6 11/15/2018 0942   BILITOT 0.75 05/11/2017 0831       Impression and Plan:  85 year old gentleman with:  1.    CLL diagnosed in 2015.  He presented with lymphocytosis and stage 0  at that time.  Laboratory testing repeated in October 2020 was personally reviewed and discussed with the patient.   His leukocytosis and lymphocytosis continues to be close to baseline indicating no progression of disease at this time.  CT scan obtained on 03/10/2019 was personally reviewed as well and showed no evidence of abdominal adenopathy.  For the time being I recommended continued active surveillance given the fact he has no indication for treatment.  He develops painful adenopathy or worsening cytopenias then consideration for therapy will be discussed.  2.  Prostate cancer: He continues to receive androgen deprivation with reasonable disease control.  3.   Status post right  hip fracture: He is status post ORIF and recovering well at this time.  4. Follow-up:  In 6 months for a follow-up.     I discussed the assessment and treatment plan with the patient. The patient was provided an opportunity to ask questions and all were answered. The patient agreed with the plan and demonstrated an understanding of the instructions.   The patient was advised to call back or seek an in-person evaluation if the symptoms worsen or if the condition fails to improve as anticipated.  I provided 20 minutes of non face-to-face telephone visit time during this encounter, and > 50% was dedicated to reviewing his laboratory data, disease status update, reviewing imaging studies as well as coordinating plan of care for the future.  Zola Button, MD 05/19/2019 10:28 AM

## 2019-05-22 ENCOUNTER — Telehealth: Payer: Self-pay | Admitting: Oncology

## 2019-05-22 NOTE — Telephone Encounter (Signed)
Scheduled appt per 11/27 los.  A calendar will be mailed out.

## 2019-05-29 ENCOUNTER — Ambulatory Visit (INDEPENDENT_AMBULATORY_CARE_PROVIDER_SITE_OTHER): Payer: Medicare Other | Admitting: *Deleted

## 2019-05-29 ENCOUNTER — Telehealth: Payer: Self-pay | Admitting: Oncology

## 2019-05-29 ENCOUNTER — Other Ambulatory Visit: Payer: Self-pay

## 2019-05-29 DIAGNOSIS — Z5181 Encounter for therapeutic drug level monitoring: Secondary | ICD-10-CM

## 2019-05-29 DIAGNOSIS — I4821 Permanent atrial fibrillation: Secondary | ICD-10-CM

## 2019-05-29 LAB — POCT INR: INR: 1.3 — AB (ref 2.0–3.0)

## 2019-05-29 NOTE — Telephone Encounter (Signed)
Returned patient's phone call regarding rescheduling 05/28 appointment, per patient's request appointment has moved t 05/25.

## 2019-05-29 NOTE — Patient Instructions (Signed)
Description   Take 1.5 today and tomorrow, then continue taking 1 tablet daily except 1.5 tablets on Saturdays. Recheck in 2 week.  Call Coumadin Clinic [phone] with any concerns.

## 2019-06-05 ENCOUNTER — Other Ambulatory Visit: Payer: Self-pay

## 2019-06-05 ENCOUNTER — Ambulatory Visit (INDEPENDENT_AMBULATORY_CARE_PROVIDER_SITE_OTHER): Payer: Medicare Other | Admitting: *Deleted

## 2019-06-05 DIAGNOSIS — Z5181 Encounter for therapeutic drug level monitoring: Secondary | ICD-10-CM

## 2019-06-05 DIAGNOSIS — I4821 Permanent atrial fibrillation: Secondary | ICD-10-CM | POA: Diagnosis not present

## 2019-06-05 LAB — POCT INR: INR: 2.4 (ref 2.0–3.0)

## 2019-06-05 NOTE — Patient Instructions (Addendum)
Description   Continue taking 1 tablet daily except 1.5 tablets on Saturdays. Recheck in 4 weeks- per pt request.  Call Coumadin Clinic [phone] with any concerns.

## 2019-06-19 ENCOUNTER — Other Ambulatory Visit: Payer: Self-pay | Admitting: Pulmonary Disease

## 2019-06-19 NOTE — Telephone Encounter (Signed)
Dr. Ander Slade, please advise if you are okay with [place] refilling med for pt.

## 2019-06-29 ENCOUNTER — Ambulatory Visit (INDEPENDENT_AMBULATORY_CARE_PROVIDER_SITE_OTHER): Payer: Medicare Other | Admitting: *Deleted

## 2019-06-29 ENCOUNTER — Encounter: Payer: Self-pay | Admitting: Cardiovascular Disease

## 2019-06-29 ENCOUNTER — Encounter: Payer: Self-pay | Admitting: Nurse Practitioner

## 2019-06-29 ENCOUNTER — Ambulatory Visit (INDEPENDENT_AMBULATORY_CARE_PROVIDER_SITE_OTHER): Payer: Medicare Other | Admitting: Cardiovascular Disease

## 2019-06-29 ENCOUNTER — Other Ambulatory Visit: Payer: Self-pay

## 2019-06-29 VITALS — BP 110/56 | HR 66 | Ht 70.0 in | Wt 212.0 lb

## 2019-06-29 DIAGNOSIS — I4821 Permanent atrial fibrillation: Secondary | ICD-10-CM | POA: Diagnosis not present

## 2019-06-29 DIAGNOSIS — I480 Paroxysmal atrial fibrillation: Secondary | ICD-10-CM

## 2019-06-29 DIAGNOSIS — I10 Essential (primary) hypertension: Secondary | ICD-10-CM | POA: Diagnosis not present

## 2019-06-29 LAB — POCT INR: INR: 2.9 (ref 2.0–3.0)

## 2019-06-29 NOTE — Patient Instructions (Signed)
Description   Continue taking 1 tablet daily except 1.5 tablets on Saturdays. Recheck in 5 weeks- per pt request.  Call Coumadin Clinic [phone] with any concerns.

## 2019-06-29 NOTE — Progress Notes (Signed)
Oscar Baker Date of Birth  May 26, 1933 [place] [address]    [place]   [place], [place]  [address]    [place], [place]  [address] [phone]  Fax  [phone]  [phone]  Fax [phone]  Problem list: 1. Sick sinus syndrome with episodes of atrial flutter, frequent episodes of bradycardia - s/p pacer Aug. 2015.  CHADS2Vasc = 4 ( age 86, HTN ,DM)  2. Hypertension 3. Dyslipidemia 4. Diabetes mellitus 5. Arthroscopic knee surgery     Oscar Baker is doing well.  He has asymptomatic bradycardia that we have followed since July 2000.  He has never had any syncope.  He had left knee surgery a year ago and right knee surgery in Dec. 2012.  No cardiac complaints.    He has done well since I last saw him.  He has bad knees and has not been walking much - he tries to avoid salt as much as possible.  He denies any chest pain, shortness breath, or syncope.  July 13, 2012 Oscar Baker is doing well.  No syncope or presyncope.  [name]    January 16, 2013:  Oscar Baker is doing well.  His HR continues to be slow.  He is asymptomatic - no syncope or presyncope.  Feb. 24, 2015:  Oscar Baker is doing well.  No syncope. No pre-syncope.  Getting his coumadin checked monthly.  No cp or dyspnea.  Had a URI this winter. We walked around the office and he increased his HR to 63.  He is able to get his heart rate up into the 70s when he is working out at [employer].  December 19, 2013:  Oscar Baker is here as a work in for fatigue and slow HR.    He has not had any episodes of syncope.   No chest pain , no dyspnea .  Dec. 30, 2015:  Oscar Baker is a 86 yo with hx of bradycardia and pacer,   placement, HTN, DM, -   No CP or dyspnea. Has had URI,  Has been on several courses of Abx.  I suggested Zyrtec or claritan   Dec. 6, 2016:  Doing well .  No CP , no dyspnea.   Has had a chronic sinus  drainage.  ? Allergies, ? GERD    Dec. 12, 2017:  Doing well,     Seen with wife , Oscar Baker  Left  hip replacement 3 weeks ago .   Alvan Dame)  No cardiac complaints.     Dec. 12, 2018:  Prostate cancer has returned.  Sees Dr. Diona Fanti.   Has an indwelling catheter at this point.  No cardiac issues ,  Is moving - has lost his meds.  Has not had amlodipine in 3 weeks.  BP is still normal   Dec. 16, 2019:  Oscar Baker is seen today for follow-up of his atrial fibrillation, hypertension, hyperlipidemia. Rough year, has been in the hospital 3 times Has had VATS surgery on his lung ( for a bacterial infection)  HAS chronic aspiration , coughs when he eats   No cardiac [place]   Jan. 7, 2021:  Oscar Baker his leg several months ago He was hospitalized for about a month or so. Still has problems with aspiration and a cough.  Current Outpatient Medications on File Prior to Visit  Medication Sig Dispense Refill  .  acetaminophen (TYLENOL) 325 MG tablet Take 2 tablets (650 mg total) by mouth every 4 (four) hours as needed for mild pain.    [name] atorvastatin (LIPITOR) 20 MG tablet TAKE ONE TABLET DAILY AT 6PM 90 tablet 0  . carvedilol (COREG) 3.125 MG tablet TAKE ONE TABLET TWICE DAILY 180 tablet 3  . fenofibrate micronized (LOFIBRA) 200 MG capsule TAKE ONE CAPSULE EACH DAY BEFORE BREAKFAST 90 capsule 2  . ferrous sulfate 325 (65 FE) MG tablet Take 1 tablet (325 mg total) by mouth daily with lunch. 30 tablet 0  . furosemide (LASIX) 20 MG tablet Take 1 tablet (20 mg total) by mouth daily.    [name] gabapentin (NEURONTIN) 300 MG capsule TAKE ONE CAPSULE TWICE A DAY 60 capsule 3  . guaiFENesin (MUCINEX) 600 MG 12 hr tablet Take 600 mg by mouth 2 (two) times daily as needed for cough.     . hydrocortisone 2.5 % cream Apply topically 2 (two) times daily. to affected area    . ipratropium (ATROVENT) 0.02 % nebulizer solution TAKE 2.66m BY NEBULIZER 3 TIMES DAILY 675 mL 0  . ketoconazole (NIZORAL) 2 % shampoo 5  APPLICATIONS TO SCALP 2 TO 3 TIMES A WEEK    . loratadine (CLARITIN) 10 MG tablet Take 10 mg by mouth daily.    . metFORMIN (GLUCOPHAGE) 1000 MG tablet Take 1,000 mg by mouth every morning.    . metFORMIN (GLUCOPHAGE) 850 MG tablet Take 850 mg by mouth at bedtime.    . methocarbamol (ROBAXIN) 500 MG tablet Robaxin 500 mg tablet   500 mg by oral route.    . Multiple Vitamin (MULTIVITAMIN WITH MINERALS) TABS tablet Take 1 tablet by mouth daily.    .Glory RosebushULTRA test strip 1 each by Other route daily.    . polyethylene glycol (MIRALAX / GLYCOLAX) 17 g packet Take 17 g by mouth daily. 14 each 0  . traMADol (ULTRAM) 50 MG tablet Take 1 tablet (50 mg total) by mouth every 12 (twelve) hours as needed for severe pain. 14 tablet 0  . vitamin B-12 (CYANOCOBALAMIN) 1000 MCG tablet Take 1,000 mcg by mouth daily.     .Marland Kitchenwarfarin (COUMADIN) 5 MG tablet TAKE AS DIRECTED BY COUMADIN CLINIC 120 tablet 0   No current facility-administered medications on file prior to visit.    Allergies  Allergen Reactions  . Altace [Ramipril] Other (See Comments)    "throat felt like had a knot in it"  . Augmentin [Amoxicillin-Pot Clavulanate] Diarrhea    severe  . Codeine Nausea And Vomiting    Nausea and vomiting   . Simvastatin Other (See Comments)    Leg aches    Past Medical History:  Diagnosis Date  . Asthma 1950's   history of  . Atrial [place]   . Bilateral carpal tunnel syndrome   . Bilateral lower extremity edema   . Bladder tumor   . Chronic systolic heart failure (HCC)    Echo 1/19: Mild LVH, EF 45-50, inf HK, MAC, severe LAE, severe RAE // Echo 7/15: Mild LVH, mod focal basal sept hypertrophy, EF 55-60, AV peak and mean 16/9, trivial MR, mod LAE, PASP 38  . CLL (chronic lymphocytic leukemia) ([facility] oncologist-  dr sIlene Qua-   dx [phone];  Lymphocytosis, CLL - per lov note 05-11-2017 currently under active survillance,  CT 04-17-2014 show very small lymphadenopathy, no indication for  treatment  . Coronary artery disease    cardiologist-  dr nCathie Olden-  08-18-2017 Intermittant  risk nuclear study w/ large area of inferior infartion with no evidence ishcemia   . 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 Diabetes mellitus type 2, noninsulin dependent ([place])   . Elevated PSA    since prostatectomy but now resolved  . Hematuria 04/2017  . History of ear infection    Right  . History of MI (myocardial infarction)    per myoview nuclear study 08-18-2017 , unknown when  . History of shingles 08/2017   L ear and scalp, possible  . Hyperlipidemia   . Hypertension   . Ischemic cardiomyopathy 09/01/2017   Presumed +CAD with Nuclear stress test 08/18/17 - Inferior scar, no ischemia, intermediate risk // med management unless +angina or worse dyspnea  . OA (osteoarthritis)   . Pacemaker 02/08/2014   followed by dr g. taylor--  single chamber Biotronik due to SSS  . Permanent atrial fibrillation ([place])   . Pneumonia 2019   Left lung  . Prostate cancer [facility]) urologist-  dr Diona Fanti   dx 2004--  Gleason 8, PSA 10.45--  11-28-2002  s/p  radical prostatectomy;  recurrent w/ increasing PSA, started ADT treatment  . RBBB (right bundle branch block)   . Sick sinus syndrome ([place])    a-Flutter with episodes of bradycardia; S/P Biotronik (serial number [id number]) 02-08-2014  . Urinary incontinence   . Wears hearing aid in right ear    receiver and transmitter    Past Surgical History:  Procedure Laterality Date  . APPENDECTOMY    . BACK SURGERY     disk  . CARDIAC CATHETERIZATION  09-03-1999  dr Cathie Olden   abnormal cardiolite study:  minor luminal irregularities but no critial coronary artery stenosis  . CARDIOVASCULAR STRESS TEST  08-18-2017  dr Cathie Olden   Intermediate risk nuclear study w/ large area inferior infarction, no evidence of ishcemia (consistant w/ prior MI)/  study not gated due to frequent PVCs  . CARPAL TUNNEL RELEASE Right 2000  . CARPAL TUNNEL RELEASE Left 11/19/2009  . CATARACT  EXTRACTION Right 07/2015  . CATARACT EXTRACTION Left 09/2015  . CYSTOSCOPY N/A 11/04/2017   Procedure: CYSTOSCOPY AND CAUTERIZATION OF BLADDER;  Surgeon: Franchot Gallo, MD;  Location: [facility];  Service: Urology;  Laterality: N/A;  . INSERTION PENILE PROSTHESIS  02-22-2004    dr Mattie Marlin  [facility]  . IR THORACENTESIS ASP PLEURAL SPACE W/IMG GUIDE  11/26/2017  . KNEE ARTHROSCOPY Left 07/2010  . ORIF PERIPROSTHETIC FRACTURE Right 03/15/2019   Procedure: OPEN REDUCTION INTERNAL FIXATION (ORIF) PERIPROSTHETIC FRACTURE WITH ZIMMER CABLES;  Surgeon: Paralee Cancel, MD;  Location: [facility];  Service: Orthopedics;  Laterality: Right;  . PERMANENT PACEMAKER INSERTION N/A 02/08/2014   Procedure: PERMANENT PACEMAKER INSERTION;  Surgeon: Evans Lance, MD;  Location[facility] C[facility] LAB;  Service: Cardiovascular;  Laterality: N/A;  . PLEURAL EFFUSION DRAINAGE Left 11/30/2017   Procedure: DRAINAGE OF HEMOTHORAX;  Surgeon: Ivin Poot, MD;  Location: [place];  Service: Thoracic;  Laterality: Left;  . RADICAL RETROPUBIC PROSTATECTOMY W/ BILATERAL PELVIC LYMPH NODE DISSECTION  11-28-2002   dr Mattie Marlin  [facility]  . TONSILLECTOMY    . TOTAL HIP ARTHROPLASTY Left 05/12/2016   Procedure: LEFT TOTAL HIP ARTHROPLASTY ANTERIOR APPROACH;  Surgeon: Paralee Cancel, MD;  Location: [facility];  Service: Orthopedics;  Laterality: Left;  . TOTAL HIP ARTHROPLASTY Right 07-15-2006   dr Alvan Dame  [facility]  . TRANSTHORACIC ECHOCARDIOGRAM  07/20/2017   mild LVH, ef 45-50%, hypokinesis of the basal-midinferior myocardium, due to AFib  unable to evaluate diastolic function/  severe LAE and RAE/  trivial PR and TR  . VIDEO ASSISTED THORACOSCOPY Left 11/30/2017   Procedure: VIDEO ASSISTED THORACOSCOPY;  Surgeon: Ivin Poot, MD;  Location: [place];  Service: Thoracic;  Laterality: Left;  [name] VIDEO ASSISTED THORACOSCOPY (VATS)/EMPYEMA Left 11/29/2017   Procedure: VIDEO ASSISTED THORACOSCOPY (VATS)/EMPYEMA;  Surgeon: Ivin Poot, MD;   [facility];  Service: Thoracic;  Laterality: Left;    Social History   Tobacco Use  Smoking Status Former Smoker  . Years: 25.00  . Types: Pipe, Cigars  . Quit date: 11/25/1975  . Years since quitting: 43.6  Smokeless Tobacco Never Used    Social History   Substance and Sexual Activity  Alcohol Use Yes  . Alcohol/week: 0.0 standard drinks   Comment: occasional    Family History  Problem Relation Age of Onset  . Emphysema Mother   . Allergic rhinitis Mother   . Heart attack Father   . Allergic rhinitis Brother   . Pulmonary fibrosis Brother     Reviw of Systems:  Reviewed in the HPI.  All other systems are negative.  Physical Exam: Blood pressure (!) 110/56, pulse 66, height _0  (1.778 m), weight 212 lb (96.2 kg), SpO2 95 %.  GEN:  Well nourished, well developed in no acute distress HEENT: Normal NECK: No JVD; No carotid bruits LYMPHATICS: No lymphadenopathy CARDIAC: RRR , no murmurs, rubs, gallops RESPIRATORY:  bilate rhonchi and wheezes,  R>L ABDOMEN: Soft, non-tender, non-distended MUSCULOSKELETAL: 2-3+ edema,  Right leg >> left leg.  SKIN: Warm and dry NEUROLOGIC:  Alert and oriented x 3   ECG    Assessment / Plan:   1. Atrial fib:     CHADS2Vasc = 4 ( age 10, HTN ,DM)   Cont warfarin  It will be ok for him to get covid vaccine   2. Hypertension-     BP looks good today .    3. Dyslipidemia-  Continue meds .  4. Diabetes mellitus 5. Arthroscopic knee surgery   Mertie Moores, MD  06/29/2019 3:22 PM    [facility] [place],  [place], [place]  [address] Pager [phone] Phone: [phone]; Fax: [phone]

## 2019-06-29 NOTE — Patient Instructions (Signed)
Medication Instructions:  Your physician recommends that you continue on your current medications as directed. Please refer to the Current Medication list given to you today.  *If you need a refill on your cardiac medications before your next appointment, please call your pharmacy*  Lab Work: None Ordered    Testing/Procedures: None Ordered   Follow-Up: At [facility], you and your health needs are our priority.  As part of our continuing mission to provide you with exceptional heart care, we have created designated Provider Care Teams.  These Care Teams include your primary Cardiologist (physician) and Advanced Practice Providers (APPs -  Physician Assistants and Nurse Practitioners) who all work together to provide you with the care you need, when you need it.  Your next appointment:   1 year(s)  The format for your next appointment:   In Person  Provider:   You may see Mertie Moores, MD or one of the following Advanced Practice Providers on your designated Care Team:    Richardson Dopp, PA-C  [place], [place]  Daune Perch, [place]

## 2019-07-10 ENCOUNTER — Ambulatory Visit (INDEPENDENT_AMBULATORY_CARE_PROVIDER_SITE_OTHER): Payer: Medicare Other | Admitting: *Deleted

## 2019-07-10 DIAGNOSIS — I255 Ischemic cardiomyopathy: Secondary | ICD-10-CM

## 2019-07-11 LAB — CUP PACEART REMOTE DEVICE CHECK
Date Time Interrogation Session: 20210119064203
Implantable Lead Implant Date: 20150820
Implantable Lead Location: 753860
Implantable Lead Model: 350
Implantable Lead Serial Number: 29625633
Implantable Pulse Generator Implant Date: 20150820
Pulse Gen Model: 394934
Pulse Gen Serial Number: 68372291

## 2019-07-12 ENCOUNTER — Other Ambulatory Visit: Payer: Self-pay | Admitting: Cardiovascular Disease

## 2019-07-20 ENCOUNTER — Ambulatory Visit: Payer: Medicare Other

## 2019-07-24 DIAGNOSIS — H60333 Swimmer's ear, bilateral: Secondary | ICD-10-CM | POA: Diagnosis not present

## 2019-07-24 DIAGNOSIS — H903 Sensorineural hearing loss, bilateral: Secondary | ICD-10-CM | POA: Diagnosis not present

## 2019-07-25 ENCOUNTER — Encounter: Payer: Self-pay | Admitting: Podiatry

## 2019-07-25 ENCOUNTER — Ambulatory Visit (INDEPENDENT_AMBULATORY_CARE_PROVIDER_SITE_OTHER): Payer: Medicare Other | Admitting: Podiatry

## 2019-07-25 ENCOUNTER — Other Ambulatory Visit: Payer: Self-pay

## 2019-07-25 DIAGNOSIS — M79675 Pain in left toe(s): Secondary | ICD-10-CM

## 2019-07-25 DIAGNOSIS — E119 Type 2 diabetes mellitus without complications: Secondary | ICD-10-CM

## 2019-07-25 DIAGNOSIS — M2042 Other hammer toe(s) (acquired), left foot: Secondary | ICD-10-CM | POA: Diagnosis not present

## 2019-07-25 DIAGNOSIS — B351 Tinea unguium: Secondary | ICD-10-CM | POA: Diagnosis not present

## 2019-07-25 DIAGNOSIS — M79674 Pain in right toe(s): Secondary | ICD-10-CM

## 2019-07-25 DIAGNOSIS — M2041 Other hammer toe(s) (acquired), right foot: Secondary | ICD-10-CM

## 2019-07-25 NOTE — Patient Instructions (Signed)

## 2019-07-26 DIAGNOSIS — C61 Malignant neoplasm of prostate: Secondary | ICD-10-CM | POA: Diagnosis not present

## 2019-07-28 ENCOUNTER — Ambulatory Visit: Payer: Medicare Other | Attending: Internal Medicine

## 2019-07-28 DIAGNOSIS — Z23 Encounter for immunization: Secondary | ICD-10-CM | POA: Insufficient documentation

## 2019-07-28 NOTE — Progress Notes (Signed)
   Covid-19 Vaccination Clinic  Name:  ALYAN LUEBKE    MRN: [id number] DOB: 20-Aug-1932  07/28/2019  Mr. Reeves was observed post Covid-19 immunization for 15 minutes without incidence. He was provided with Vaccine Information Sheet and instruction to access the V-Safe system.   Mr. Hensey was instructed to call 911 with any severe reactions post vaccine: [name] Difficulty breathing  . Swelling of your face and throat  . A fast heartbeat  . A bad rash all over your body  . Dizziness and weakness    Immunizations Administered    Name Date Dose VIS Date Route   Pfizer COVID-19 Vaccine 07/28/2019  3:57 PM 0.3 mL 06/02/2019 Intramuscular   Manufacturer: [place]   Lot: [id number]   [place]: [id number]

## 2019-07-29 NOTE — Progress Notes (Signed)
Subjective: Oscar Baker presents today for follow up of preventative diabetic foot care and painful mycotic nails b/l that are difficult to trim. Pain interferes with ambulation. Aggravating factors include wearing enclosed shoe gear. Pain is relieved with periodic professional debridement.   Current Outpatient Medications on File Prior to Visit  Medication Sig  . acetaminophen (TYLENOL) 325 MG tablet Take 2 tablets (650 mg total) by mouth every 4 (four) hours as needed for mild pain.  [name] alendronate (FOSAMAX) 70 MG tablet Take 70 mg by mouth once a week.  [name] atorvastatin (LIPITOR) 20 MG tablet TAKE ONE TABLET DAILY AT 6PM  . carvedilol (COREG) 3.125 MG tablet TAKE ONE TABLET TWICE DAILY  . ciprofloxacin (CILOXAN) 0.3 % ophthalmic solution 4 drops 2 (two) times daily.  . fenofibrate micronized (LOFIBRA) 200 MG capsule TAKE ONE CAPSULE EACH DAY BEFORE BREAKFAST  . ferrous sulfate 325 (65 FE) MG tablet Take 1 tablet (325 mg total) by mouth daily with lunch.  . furosemide (LASIX) 20 MG tablet Take 1 tablet (20 mg total) by mouth daily.  [name] gabapentin (NEURONTIN) 300 MG capsule TAKE ONE CAPSULE TWICE A DAY  . guaiFENesin (MUCINEX) 600 MG 12 hr tablet Take 600 mg by mouth 2 (two) times daily as needed for cough.   . hydrocortisone 2.5 % cream Apply topically 2 (two) times daily. to affected area  . ipratropium (ATROVENT) 0.02 % nebulizer solution TAKE 2.53mL BY NEBULIZER 3 TIMES DAILY  . ketoconazole (NIZORAL) 2 % shampoo 5 APPLICATIONS TO SCALP 2 TO 3 TIMES A WEEK  . loratadine (CLARITIN) 10 MG tablet Take 10 mg by mouth daily.  . metFORMIN (GLUCOPHAGE) 1000 MG tablet Take 1,000 mg by mouth every morning.  . metFORMIN (GLUCOPHAGE) 850 MG tablet Take 850 mg by mouth at bedtime.  . methocarbamol (ROBAXIN) 500 MG tablet Robaxin 500 mg tablet   500 mg by oral route.  . mometasone (ELOCON) 0.1 % cream APPLY TO EXTERNAL EAR ONCE A DAY FOR 7 DAYS THEN AS NEEDED FOR ITCHING  . Multiple Vitamin  (MULTIVITAMIN WITH MINERALS) TABS tablet Take 1 tablet by mouth daily.  Glory Rosebush ULTRA test strip 1 each by Other route daily.  . polyethylene glycol (MIRALAX / GLYCOLAX) 17 g packet Take 17 g by mouth daily.  . prednisoLONE acetate (PRED FORTE) 1 % ophthalmic suspension 4 drops 2 (two) times daily.  . traMADol (ULTRAM) 50 MG tablet Take 1 tablet (50 mg total) by mouth every 12 (twelve) hours as needed for severe pain.  . vitamin B-12 (CYANOCOBALAMIN) 1000 MCG tablet Take 1,000 mcg by mouth daily.   [name] warfarin (COUMADIN) 5 MG tablet TAKE AS DIRECTED BY COUMADIN CLINIC   No current facility-administered medications on file prior to visit.    Allergies  Allergen Reactions  . Altace [Ramipril] Other (See Comments)    "throat felt like had a knot in it"  . Augmentin [Amoxicillin-Pot Clavulanate] Diarrhea    severe  . Codeine Nausea And Vomiting    Nausea and vomiting   . Simvastatin Other (See Comments)    Leg aches    Objective: There were no vitals filed for this visit.  Vascular Examination:  Capillary fill time to digits <3s b/l, palpable DP pulses b/l, palpable PT pulses b/l, pedal hair sparse b/l and skin temperature gradient within normal limits b/l  Dermatological Examination: Pedal skin with normal turgor, texture and tone bilaterally, no open wounds bilaterally, no interdigital macerations bilaterally and toenails 1-5 b/l elongated, dystrophic, thickened,  crumbly with subungual debris  Musculoskeletal: Normal muscle strength 5/5 to all lower extremity muscle groups bilaterally, no pain crepitus or joint limitation noted with ROM b/l and hammertoes noted to the  2-5 bilaterally  Neurological: Protective sensation intact 5/5 intact bilaterally with 10g monofilament b/l and vibratory sensation intact b/l   Hemoglobin A1C Latest Ref Rng & Units 03/10/2019  HGBA1C 4.8 - 5.6 % 6.4(H)  Some recent data might be hidden   Assessment: Pain due to onychomycosis of toenails of  both feet  Type 2 diabetes mellitus without complication, without long-term current use of insulin (HCC)  Acquired hammertoes of both feet  Encounter for diabetic foot exam ([place])  Plan: -Diabetic foot examination performed on today's visit. -Continue diabetic foot care principles. Literature dispensed on today.  -Toenails 1-5 b/l were debrided in length and girth without iatrogenic bleeding. -Patient to continue soft, supportive shoe gear daily. -Patient to report any pedal injuries to medical professional immediately. -Patient/POA to call should there be question/concern in the interim.  Return in about 3 months (around 10/22/2019) for nail trim.

## 2019-08-02 DIAGNOSIS — N3945 Continuous leakage: Secondary | ICD-10-CM | POA: Diagnosis not present

## 2019-08-02 DIAGNOSIS — C61 Malignant neoplasm of prostate: Secondary | ICD-10-CM | POA: Diagnosis not present

## 2019-08-03 ENCOUNTER — Other Ambulatory Visit: Payer: Self-pay

## 2019-08-03 ENCOUNTER — Ambulatory Visit (INDEPENDENT_AMBULATORY_CARE_PROVIDER_SITE_OTHER): Payer: Medicare Other

## 2019-08-03 DIAGNOSIS — I480 Paroxysmal atrial fibrillation: Secondary | ICD-10-CM

## 2019-08-03 DIAGNOSIS — I4821 Permanent atrial fibrillation: Secondary | ICD-10-CM | POA: Diagnosis not present

## 2019-08-03 DIAGNOSIS — Z5181 Encounter for therapeutic drug level monitoring: Secondary | ICD-10-CM

## 2019-08-03 LAB — POCT INR: INR: 2.7 (ref 2.0–3.0)

## 2019-08-03 NOTE — Patient Instructions (Signed)
Description   Continue taking 1 tablet daily except 1.5 tablets on Saturdays. Recheck in 6 weeks.  Call Coumadin Clinic [phone] with any concerns.

## 2019-08-08 DIAGNOSIS — H903 Sensorineural hearing loss, bilateral: Secondary | ICD-10-CM | POA: Diagnosis not present

## 2019-08-08 DIAGNOSIS — H838X3 Other specified diseases of inner ear, bilateral: Secondary | ICD-10-CM | POA: Diagnosis not present

## 2019-08-08 DIAGNOSIS — H66013 Acute suppurative otitis media with spontaneous rupture of ear drum, bilateral: Secondary | ICD-10-CM | POA: Diagnosis not present

## 2019-08-09 DIAGNOSIS — Z961 Presence of intraocular lens: Secondary | ICD-10-CM | POA: Diagnosis not present

## 2019-08-09 DIAGNOSIS — H1789 Other corneal scars and opacities: Secondary | ICD-10-CM | POA: Diagnosis not present

## 2019-08-09 DIAGNOSIS — H47322 Drusen of optic disc, left eye: Secondary | ICD-10-CM | POA: Diagnosis not present

## 2019-08-14 ENCOUNTER — Encounter: Payer: Medicare Other | Admitting: Internal Medicine

## 2019-08-15 ENCOUNTER — Encounter: Payer: Self-pay | Admitting: Internal Medicine

## 2019-08-15 ENCOUNTER — Ambulatory Visit (INDEPENDENT_AMBULATORY_CARE_PROVIDER_SITE_OTHER): Payer: Medicare Other | Admitting: Internal Medicine

## 2019-08-15 ENCOUNTER — Other Ambulatory Visit: Payer: Self-pay

## 2019-08-15 VITALS — BP 118/60 | HR 68 | Ht 70.0 in | Wt 211.6 lb

## 2019-08-15 DIAGNOSIS — I480 Paroxysmal atrial fibrillation: Secondary | ICD-10-CM

## 2019-08-15 DIAGNOSIS — I5022 Chronic systolic (congestive) heart failure: Secondary | ICD-10-CM | POA: Diagnosis not present

## 2019-08-15 DIAGNOSIS — Z950 Presence of cardiac pacemaker: Secondary | ICD-10-CM | POA: Insufficient documentation

## 2019-08-15 DIAGNOSIS — I255 Ischemic cardiomyopathy: Secondary | ICD-10-CM

## 2019-08-15 DIAGNOSIS — I495 Sick sinus syndrome: Secondary | ICD-10-CM

## 2019-08-15 NOTE — Progress Notes (Signed)
HPI Oscar Baker returns today for followup. He is a pleasant 86 yo man with perm atrial fib, HTN, and dyslipidemia, s/p PPM insertion. The patient has not had any anginal symptoms.  Allergies  Allergen Reactions  . Altace [Ramipril] Other (See Comments)    "throat felt like had a knot in it"  . Augmentin [Amoxicillin-Pot Clavulanate] Diarrhea    severe  . Codeine Nausea And Vomiting    Nausea and vomiting   . Simvastatin Other (See Comments)    Leg aches     Current Outpatient Medications  Medication Sig Dispense Refill  . acetaminophen (TYLENOL) 325 MG tablet Take 2 tablets (650 mg total) by mouth every 4 (four) hours as needed for mild pain.    [name] alendronate (FOSAMAX) 70 MG tablet Take 70 mg by mouth once a week.    [name] atorvastatin (LIPITOR) 20 MG tablet TAKE ONE TABLET DAILY AT 6PM 90 tablet 0  . carvedilol (COREG) 3.125 MG tablet TAKE ONE TABLET TWICE DAILY 180 tablet 3  . ciprofloxacin (CILOXAN) 0.3 % ophthalmic solution 4 drops 2 (two) times daily.    . fenofibrate micronized (LOFIBRA) 200 MG capsule TAKE ONE CAPSULE EACH DAY BEFORE BREAKFAST 90 capsule 3  . furosemide (LASIX) 20 MG tablet Take 1 tablet (20 mg total) by mouth daily.    [name] gabapentin (NEURONTIN) 300 MG capsule TAKE ONE CAPSULE TWICE A DAY 60 capsule 3  . glipiZIDE (GLUCOTROL XL) 2.5 MG 24 hr tablet Take 2.5 mg by mouth. Take 1 tablet daily if blood glucose >140    . guaiFENesin (MUCINEX) 600 MG 12 hr tablet Take 600 mg by mouth 2 (two) times daily as needed for cough.     [name] HYDROcodone-homatropine (HYCODAN) 5-1.5 MG/5ML syrup Take 5 mLs by mouth 2 times daily at 12 noon and 4 pm. As needed    . hydrocortisone 2.5 % cream Apply topically 2 (two) times daily. to affected area    . ipratropium (ATROVENT) 0.02 % nebulizer solution TAKE 2.46m BY NEBULIZER 3 TIMES DAILY 675 mL 0  . ketoconazole (NIZORAL) 2 % shampoo 5 APPLICATIONS TO SCALP 2 TO 3 TIMES A WEEK    . metFORMIN (GLUCOPHAGE) 1000 MG tablet Take 1,000  mg by mouth every morning.    . metFORMIN (GLUCOPHAGE) 850 MG tablet Take 850 mg by mouth at bedtime.    . methocarbamol (ROBAXIN) 500 MG tablet Robaxin 500 mg tablet   500 mg by oral route.    . mometasone (ELOCON) 0.1 % cream APPLY TO EXTERNAL EAR ONCE A DAY FOR 7 DAYS THEN AS NEEDED FOR ITCHING    . Multiple Vitamin (MULTIVITAMIN WITH MINERALS) TABS tablet Take 1 tablet by mouth daily.    .Glory RosebushULTRA test strip 1 each by Other route daily.    . prednisoLONE acetate (PRED FORTE) 1 % ophthalmic suspension 4 drops 2 (two) times daily.    . traMADol (ULTRAM) 50 MG tablet Take 1 tablet (50 mg total) by mouth every 12 (twelve) hours as needed for severe pain. 14 tablet 0  . vitamin B-12 (CYANOCOBALAMIN) 1000 MCG tablet Take 1,000 mcg by mouth daily.     .Marland Kitchenwarfarin (COUMADIN) 5 MG tablet TAKE AS DIRECTED BY COUMADIN CLINIC 120 tablet 0   No current facility-administered medications for this visit.     Past Medical History:  Diagnosis Date  . Asthma 1950's   history of  . [place]   . Bilateral carpal tunnel  syndrome   . Bilateral lower extremity edema   . Bladder tumor   . Chronic systolic heart failure (HCC)    Echo 1/19: Mild LVH, EF 45-50, inf HK, MAC, severe LAE, severe RAE // Echo 7/15: Mild LVH, mod focal basal sept hypertrophy, EF 55-60, AV peak and mean 16/9, trivial MR, mod LAE, PASP 38  . CLL (chronic lymphocytic leukemia) [facility]) oncologist-  dr Ilene Qua--   dx [phone] ;  Lymphocytosis, CLL - per lov note 05-11-2017 currently under active survillance,  CT 04-17-2014 show very small lymphadenopathy, no indication for treatment  . Coronary artery disease    cardiologist-  dr Cathie Olden--  08-18-2017 Intermittant risk nuclear study w/ large area of inferior infartion with no evidence ishcemia   . 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 Diabetes mellitus type 2, noninsulin dependent ([place])   . Elevated PSA    since prostatectomy but now resolved  . Hematuria 04/2017  . History of  ear infection    Right  . History of MI (myocardial infarction)    per myoview nuclear study 08-18-2017 , unknown when  . History of shingles 08/2017   L ear and scalp, possible  . Hyperlipidemia   . Hypertension   . Ischemic cardiomyopathy 09/01/2017   Presumed +CAD with Nuclear stress test 08/18/17 - Inferior scar, no ischemia, intermediate risk // med management unless +angina or worse dyspnea  . OA (osteoarthritis)   . Pacemaker 02/08/2014   followed by dr g. Dail Meece--  single chamber Biotronik due to SSS  . Permanent atrial fibrillation ([place])   . Pneumonia 2019   Left lung  . Prostate cancer [facility]) urologist-  dr Diona Fanti   dx 2004--  Gleason 8, PSA 10.45--  11-28-2002  s/p  radical prostatectomy;  recurrent w/ increasing PSA, started ADT treatment  . RBBB (right bundle branch block)   . Sick sinus syndrome ([place])    a-Flutter with episodes of bradycardia; S/P Biotronik (serial number [id number]) 02-08-2014  . Urinary incontinence   . Wears hearing aid in right ear    receiver and transmitter    ROS:   All systems reviewed and negative except as noted in the HPI.   Past Surgical History:  Procedure Laterality Date  . APPENDECTOMY    . BACK SURGERY     disk  . CARDIAC CATHETERIZATION  09-03-1999  dr Cathie Olden   abnormal cardiolite study:  minor luminal irregularities but no critial coronary artery stenosis  . CARDIOVASCULAR STRESS TEST  08-18-2017  dr Cathie Olden   Intermediate risk nuclear study w/ large area inferior infarction, no evidence of ishcemia (consistant w/ prior MI)/  study not gated due to frequent PVCs  . CARPAL TUNNEL RELEASE Right 2000  . CARPAL TUNNEL RELEASE Left 11/19/2009  . CATARACT EXTRACTION Right 07/2015  . CATARACT EXTRACTION Left 09/2015  . CYSTOSCOPY N/A 11/04/2017   Procedure: CYSTOSCOPY AND CAUTERIZATION OF BLADDER;  Surgeon: Franchot Gallo, MD;  Location: [facility];  Service: Urology;  Laterality: N/A;  . INSERTION PENILE  PROSTHESIS  02-22-2004    dr Mattie Marlin  [facility]  . IR THORACENTESIS ASP PLEURAL SPACE W/IMG GUIDE  11/26/2017  . KNEE ARTHROSCOPY Left 07/2010  . ORIF PERIPROSTHETIC FRACTURE Right 03/15/2019   Procedure: OPEN REDUCTION INTERNAL FIXATION (ORIF) PERIPROSTHETIC FRACTURE WITH ZIMMER CABLES;  Surgeon: Paralee Cancel, MD;  Location: [place];  Service: Orthopedics;  Laterality: Right;  . PERMANENT PACEMAKER INSERTION N/A 02/08/2014   Procedure: PERMANENT PACEMAKER INSERTION;  Surgeon: Champ Mungo  Lovena Le, MD;  Location: [facility] CATH LAB;  Service: Cardiovascular;  Laterality: N/A;  . PLEURAL EFFUSION DRAINAGE Left 11/30/2017   Procedure: DRAINAGE OF HEMOTHORAX;  Surgeon: Ivin Poot, MD;  Location: [place];  Service: Thoracic;  Laterality: Left;  . RADICAL RETROPUBIC PROSTATECTOMY W/ BILATERAL PELVIC LYMPH NODE DISSECTION  11-28-2002   dr Mattie Marlin  [facility]  . TONSILLECTOMY    . TOTAL HIP ARTHROPLASTY Left 05/12/2016   Procedure: LEFT TOTAL HIP ARTHROPLASTY ANTERIOR APPROACH;  Surgeon: Paralee Cancel, MD;  Location: [facility];  Service: Orthopedics;  Laterality: Left;  . TOTAL HIP ARTHROPLASTY Right 07-15-2006   dr Alvan Dame  [facility]  . TRANSTHORACIC ECHOCARDIOGRAM  07/20/2017   mild LVH, ef 45-50%, hypokinesis of the basal-midinferior myocardium, due to AFib unable to evaluate diastolic function/  severe LAE and RAE/  trivial PR and TR  . VIDEO ASSISTED THORACOSCOPY Left 11/30/2017   Procedure: VIDEO ASSISTED THORACOSCOPY;  Surgeon: Ivin Poot, MD;  Location: [place];  Service: Thoracic;  Laterality: Left;  [name] VIDEO ASSISTED THORACOSCOPY (VATS)/EMPYEMA Left 11/29/2017   Procedure: VIDEO ASSISTED THORACOSCOPY (VATS)/EMPYEMA;  Surgeon: Ivin Poot, MD;  Location: [facility];  Service: Thoracic;  Laterality: Left;     Family History  Problem Relation Age of Onset  . Emphysema Mother   . Allergic rhinitis Mother   . Heart attack Father   . Allergic rhinitis Brother   . Pulmonary fibrosis Brother      Social History    Socioeconomic History  . Marital status: Married    Spouse name: Not on file  . Number of children: Not on file  . Years of education: Not on file  . Highest education level: Not on file  Occupational History  . Not on file  Tobacco Use  . Smoking status: Former Smoker    Years: 25.00    Types: Pipe, Cigars    Quit date: 11/25/1975    Years since quitting: 43.7  . Smokeless tobacco: Never Used  Substance and Sexual Activity  . Alcohol use: Yes    Alcohol/week: 0.0 standard drinks    Comment: occasional  . Drug use: No  . Sexual activity: Not on file  Other Topics Concern  . Not on file  Social History Narrative  . Not on file   Social Determinants of Health   Financial Resource Strain:   . Difficulty of Paying Living Expenses: Not on file  Food Insecurity:   . Worried About Charity fundraiser in the Last Year: Not on file  . Ran Out of Food in the Last Year: Not on file  Transportation Needs:   . Lack of Transportation (Medical): Not on file  . Lack of Transportation (Non-Medical): Not on file  Physical Activity:   . Days of Exercise per Week: Not on file  . Minutes of Exercise per Session: Not on file  Stress:   . Feeling of Stress : Not on file  Social Connections:   . Frequency of Communication with Friends and Family: Not on file  . Frequency of Social Gatherings with Friends and Family: Not on file  . Attends Religious Services: Not on file  . Active Member of Clubs or Organizations: Not on file  . Attends Archivist Meetings: Not on file  . Marital Status: Not on file  Intimate Partner Violence:   . Fear of Current or Ex-Partner: Not on file  . Emotionally Abused: Not on file  . Physically Abused: Not on file  .  Sexually Abused: Not on file     BP 118/60   Pulse 68   Ht _0  (1.778 m)   Wt 211 lb 9.6 oz (96 kg)   SpO2 90%   BMI 30.36 kg/m   Physical Exam:  Well appearing NAD HEENT: Unremarkable Neck:  No JVD, no  thyromegally Lymphatics:  No adenopathy Back:  No CVA tenderness Lungs:  Clear with no wheezes HEART:  Regular rate rhythm, no murmurs, no rubs, no clicks Abd:  soft, positive bowel sounds, no organomegally, no rebound, no guarding Ext:  2 plus pulses, no edema, no cyanosis, no clubbing Skin:  No rashes no nodules Neuro:  CN II through XII intact, motor grossly intact  DEVICE  Normal device function.  See PaceArt for details.   Assess/Plan: 1. Perm atrial fib - his VR is well controlled, s/p PPM insertion. 2. CAD - he denies anginal symptoms. He will continue his current meds. 3. HTN - His bp is well controlled. He will continue his current meds.  Mikle Bosworth.D.

## 2019-08-15 NOTE — Patient Instructions (Signed)
Medication Instructions:  Your physician recommends that you continue on your current medications as directed. Please refer to the Current Medication list given to you today.  Labwork: None ordered.  Testing/Procedures: None ordered.  Follow-Up: Your physician wants you to follow-up in: one year with Dr. Lovena Le.   You will receive a reminder letter in the mail two months in advance. If you don't receive a letter, please call our office to schedule the follow-up appointment.  Remote monitoring is used to monitor your Pacemaker from home. This monitoring reduces the number of office visits required to check your device to one time per year. It allows [place] to keep an eye on the functioning of your device to ensure it is working properly. You are scheduled for a device check from home on 10/09/2019. You may send your transmission at any time that day. If you have a wireless device, the transmission will be sent automatically. After your physician reviews your transmission, you will receive a postcard with your next transmission date.  Any Other Special Instructions Will Be Listed Below (If Applicable).  If you need a refill on your cardiac medications before your next appointment, please call your pharmacy.

## 2019-08-22 ENCOUNTER — Ambulatory Visit: Payer: Medicare Other | Attending: Internal Medicine

## 2019-08-22 DIAGNOSIS — Z23 Encounter for immunization: Secondary | ICD-10-CM | POA: Insufficient documentation

## 2019-08-22 NOTE — Progress Notes (Signed)
   Covid-19 Vaccination Clinic  Name:  Oscar Baker    MRN: [id number] DOB: Mar 13, 1933  08/22/2019  [name] was observed post Covid-19 immunization for 15 minutes without incident. He was provided with Vaccine Information Sheet and instruction to access the V-Safe system.   [name] was instructed to call 911 with any severe reactions post vaccine: [name] Difficulty breathing  . Swelling of face and throat  . A fast heartbeat  . A bad rash all over body  . Dizziness and weakness   Immunizations Administered    Name Date Dose VIS Date Route   Pfizer COVID-19 Vaccine 08/22/2019  3:24 PM 0.3 mL 06/02/2019 Intramuscular   Manufacturer: [place]   Lot: [id number]   [place]: [id number]

## 2019-09-06 ENCOUNTER — Other Ambulatory Visit: Payer: Self-pay | Admitting: Cardiovascular Disease

## 2019-09-06 DIAGNOSIS — C9110 Chronic lymphocytic leukemia of B-cell type not having achieved remission: Secondary | ICD-10-CM

## 2019-09-14 ENCOUNTER — Ambulatory Visit (INDEPENDENT_AMBULATORY_CARE_PROVIDER_SITE_OTHER): Payer: Medicare Other

## 2019-09-14 ENCOUNTER — Other Ambulatory Visit: Payer: Self-pay

## 2019-09-14 DIAGNOSIS — Z5181 Encounter for therapeutic drug level monitoring: Secondary | ICD-10-CM

## 2019-09-14 DIAGNOSIS — I480 Paroxysmal atrial fibrillation: Secondary | ICD-10-CM | POA: Diagnosis not present

## 2019-09-14 DIAGNOSIS — I4821 Permanent atrial fibrillation: Secondary | ICD-10-CM

## 2019-09-14 LAB — POCT INR: INR: 2.7 (ref 2.0–3.0)

## 2019-09-14 NOTE — Patient Instructions (Signed)
Description   Continue on same dosage 1 tablet daily except 1.5 tablets on Saturdays. Recheck in 6 weeks.  Call Coumadin Clinic [phone] with any concerns.

## 2019-09-27 DIAGNOSIS — C61 Malignant neoplasm of prostate: Secondary | ICD-10-CM | POA: Diagnosis not present

## 2019-10-09 ENCOUNTER — Ambulatory Visit (INDEPENDENT_AMBULATORY_CARE_PROVIDER_SITE_OTHER): Payer: Medicare Other | Admitting: *Deleted

## 2019-10-09 DIAGNOSIS — I255 Ischemic cardiomyopathy: Secondary | ICD-10-CM

## 2019-10-09 LAB — CUP PACEART REMOTE DEVICE CHECK
Date Time Interrogation Session: 20210419110922
Implantable Lead Implant Date: 20150820
Implantable Lead Location: 753860
Implantable Lead Model: 350
Implantable Lead Serial Number: 29625633
Implantable Pulse Generator Implant Date: 20150820
Pulse Gen Model: 394934
Pulse Gen Serial Number: 68372291

## 2019-10-10 NOTE — Progress Notes (Signed)
PPM Remote  

## 2019-10-12 ENCOUNTER — Other Ambulatory Visit: Payer: Self-pay | Admitting: Internal Medicine

## 2019-10-16 ENCOUNTER — Inpatient Hospital Stay (HOSPITAL_COMMUNITY)
Admission: EM | Admit: 2019-10-16 | Discharge: 2019-10-21 | DRG: 871 | Disposition: E | Payer: Medicare Other | Source: Ambulatory Visit | Attending: Student | Admitting: Student

## 2019-10-16 ENCOUNTER — Emergency Department (HOSPITAL_COMMUNITY): Payer: Medicare Other

## 2019-10-16 ENCOUNTER — Other Ambulatory Visit: Payer: Self-pay

## 2019-10-16 ENCOUNTER — Encounter (HOSPITAL_COMMUNITY): Payer: Self-pay | Admitting: Emergency Medicine

## 2019-10-16 DIAGNOSIS — R54 Age-related physical debility: Secondary | ICD-10-CM | POA: Diagnosis present

## 2019-10-16 DIAGNOSIS — R5381 Other malaise: Secondary | ICD-10-CM | POA: Diagnosis not present

## 2019-10-16 DIAGNOSIS — J918 Pleural effusion in other conditions classified elsewhere: Secondary | ICD-10-CM | POA: Diagnosis present

## 2019-10-16 DIAGNOSIS — E785 Hyperlipidemia, unspecified: Secondary | ICD-10-CM | POA: Diagnosis present

## 2019-10-16 DIAGNOSIS — I495 Sick sinus syndrome: Secondary | ICD-10-CM | POA: Diagnosis present

## 2019-10-16 DIAGNOSIS — R0689 Other abnormalities of breathing: Secondary | ICD-10-CM | POA: Diagnosis not present

## 2019-10-16 DIAGNOSIS — R6520 Severe sepsis without septic shock: Secondary | ICD-10-CM | POA: Diagnosis present

## 2019-10-16 DIAGNOSIS — C9110 Chronic lymphocytic leukemia of B-cell type not having achieved remission: Secondary | ICD-10-CM

## 2019-10-16 DIAGNOSIS — J690 Pneumonitis due to inhalation of food and vomit: Secondary | ICD-10-CM | POA: Diagnosis present

## 2019-10-16 DIAGNOSIS — R0602 Shortness of breath: Secondary | ICD-10-CM | POA: Diagnosis not present

## 2019-10-16 DIAGNOSIS — J90 Pleural effusion, not elsewhere classified: Secondary | ICD-10-CM

## 2019-10-16 DIAGNOSIS — H9191 Unspecified hearing loss, right ear: Secondary | ICD-10-CM | POA: Diagnosis present

## 2019-10-16 DIAGNOSIS — R091 Pleurisy: Secondary | ICD-10-CM | POA: Diagnosis not present

## 2019-10-16 DIAGNOSIS — R52 Pain, unspecified: Secondary | ICD-10-CM | POA: Diagnosis not present

## 2019-10-16 DIAGNOSIS — Z87891 Personal history of nicotine dependence: Secondary | ICD-10-CM | POA: Diagnosis not present

## 2019-10-16 DIAGNOSIS — T45515A Adverse effect of anticoagulants, initial encounter: Secondary | ICD-10-CM | POA: Diagnosis present

## 2019-10-16 DIAGNOSIS — J9601 Acute respiratory failure with hypoxia: Secondary | ICD-10-CM | POA: Diagnosis not present

## 2019-10-16 DIAGNOSIS — N179 Acute kidney failure, unspecified: Secondary | ICD-10-CM | POA: Diagnosis present

## 2019-10-16 DIAGNOSIS — I252 Old myocardial infarction: Secondary | ICD-10-CM | POA: Diagnosis not present

## 2019-10-16 DIAGNOSIS — R1310 Dysphagia, unspecified: Secondary | ICD-10-CM | POA: Diagnosis not present

## 2019-10-16 DIAGNOSIS — Z9689 Presence of other specified functional implants: Secondary | ICD-10-CM

## 2019-10-16 DIAGNOSIS — C61 Malignant neoplasm of prostate: Secondary | ICD-10-CM | POA: Diagnosis not present

## 2019-10-16 DIAGNOSIS — Z66 Do not resuscitate: Secondary | ICD-10-CM | POA: Diagnosis present

## 2019-10-16 DIAGNOSIS — I5082 Biventricular heart failure: Secondary | ICD-10-CM | POA: Diagnosis present

## 2019-10-16 DIAGNOSIS — R069 Unspecified abnormalities of breathing: Secondary | ICD-10-CM | POA: Diagnosis not present

## 2019-10-16 DIAGNOSIS — D72824 Basophilia: Secondary | ICD-10-CM | POA: Diagnosis not present

## 2019-10-16 DIAGNOSIS — E1122 Type 2 diabetes mellitus with diabetic chronic kidney disease: Secondary | ICD-10-CM | POA: Diagnosis not present

## 2019-10-16 DIAGNOSIS — I350 Nonrheumatic aortic (valve) stenosis: Secondary | ICD-10-CM | POA: Diagnosis not present

## 2019-10-16 DIAGNOSIS — Z7901 Long term (current) use of anticoagulants: Secondary | ICD-10-CM

## 2019-10-16 DIAGNOSIS — H9190 Unspecified hearing loss, unspecified ear: Secondary | ICD-10-CM | POA: Diagnosis present

## 2019-10-16 DIAGNOSIS — Z8249 Family history of ischemic heart disease and other diseases of the circulatory system: Secondary | ICD-10-CM

## 2019-10-16 DIAGNOSIS — Z7984 Long term (current) use of oral hypoglycemic drugs: Secondary | ICD-10-CM

## 2019-10-16 DIAGNOSIS — R0902 Hypoxemia: Secondary | ICD-10-CM | POA: Diagnosis not present

## 2019-10-16 DIAGNOSIS — I2510 Atherosclerotic heart disease of native coronary artery without angina pectoris: Secondary | ICD-10-CM | POA: Diagnosis present

## 2019-10-16 DIAGNOSIS — Z515 Encounter for palliative care: Secondary | ICD-10-CM | POA: Diagnosis not present

## 2019-10-16 DIAGNOSIS — J189 Pneumonia, unspecified organism: Secondary | ICD-10-CM

## 2019-10-16 DIAGNOSIS — I4821 Permanent atrial fibrillation: Secondary | ICD-10-CM | POA: Diagnosis present

## 2019-10-16 DIAGNOSIS — I110 Hypertensive heart disease with heart failure: Secondary | ICD-10-CM | POA: Diagnosis present

## 2019-10-16 DIAGNOSIS — Z789 Other specified health status: Secondary | ICD-10-CM | POA: Diagnosis not present

## 2019-10-16 DIAGNOSIS — I5023 Acute on chronic systolic (congestive) heart failure: Secondary | ICD-10-CM | POA: Diagnosis present

## 2019-10-16 DIAGNOSIS — I255 Ischemic cardiomyopathy: Secondary | ICD-10-CM | POA: Diagnosis present

## 2019-10-16 DIAGNOSIS — C9111 Chronic lymphocytic leukemia of B-cell type in remission: Secondary | ICD-10-CM | POA: Diagnosis present

## 2019-10-16 DIAGNOSIS — Z825 Family history of asthma and other chronic lower respiratory diseases: Secondary | ICD-10-CM

## 2019-10-16 DIAGNOSIS — I4891 Unspecified atrial fibrillation: Secondary | ICD-10-CM

## 2019-10-16 DIAGNOSIS — D689 Coagulation defect, unspecified: Secondary | ICD-10-CM | POA: Diagnosis present

## 2019-10-16 DIAGNOSIS — I5043 Acute on chronic combined systolic (congestive) and diastolic (congestive) heart failure: Secondary | ICD-10-CM | POA: Diagnosis not present

## 2019-10-16 DIAGNOSIS — R791 Abnormal coagulation profile: Secondary | ICD-10-CM | POA: Diagnosis not present

## 2019-10-16 DIAGNOSIS — Z79899 Other long term (current) drug therapy: Secondary | ICD-10-CM

## 2019-10-16 DIAGNOSIS — J9600 Acute respiratory failure, unspecified whether with hypoxia or hypercapnia: Secondary | ICD-10-CM | POA: Diagnosis not present

## 2019-10-16 DIAGNOSIS — J869 Pyothorax without fistula: Secondary | ICD-10-CM | POA: Diagnosis present

## 2019-10-16 DIAGNOSIS — E1165 Type 2 diabetes mellitus with hyperglycemia: Secondary | ICD-10-CM | POA: Diagnosis present

## 2019-10-16 DIAGNOSIS — I959 Hypotension, unspecified: Secondary | ICD-10-CM | POA: Diagnosis not present

## 2019-10-16 DIAGNOSIS — R05 Cough: Secondary | ICD-10-CM | POA: Diagnosis not present

## 2019-10-16 DIAGNOSIS — K219 Gastro-esophageal reflux disease without esophagitis: Secondary | ICD-10-CM | POA: Diagnosis not present

## 2019-10-16 DIAGNOSIS — Z8546 Personal history of malignant neoplasm of prostate: Secondary | ICD-10-CM

## 2019-10-16 DIAGNOSIS — E875 Hyperkalemia: Secondary | ICD-10-CM | POA: Diagnosis present

## 2019-10-16 DIAGNOSIS — Z20822 Contact with and (suspected) exposure to covid-19: Secondary | ICD-10-CM | POA: Diagnosis present

## 2019-10-16 DIAGNOSIS — Z96643 Presence of artificial hip joint, bilateral: Secondary | ICD-10-CM | POA: Diagnosis present

## 2019-10-16 DIAGNOSIS — J9690 Respiratory failure, unspecified, unspecified whether with hypoxia or hypercapnia: Secondary | ICD-10-CM | POA: Diagnosis not present

## 2019-10-16 DIAGNOSIS — A419 Sepsis, unspecified organism: Secondary | ICD-10-CM | POA: Diagnosis present

## 2019-10-16 DIAGNOSIS — I4892 Unspecified atrial flutter: Secondary | ICD-10-CM | POA: Diagnosis not present

## 2019-10-16 DIAGNOSIS — Z7983 Long term (current) use of bisphosphonates: Secondary | ICD-10-CM

## 2019-10-16 DIAGNOSIS — Z950 Presence of cardiac pacemaker: Secondary | ICD-10-CM

## 2019-10-16 DIAGNOSIS — I4510 Unspecified right bundle-branch block: Secondary | ICD-10-CM | POA: Diagnosis present

## 2019-10-16 DIAGNOSIS — I5022 Chronic systolic (congestive) heart failure: Secondary | ICD-10-CM | POA: Diagnosis not present

## 2019-10-16 LAB — CBC WITH DIFFERENTIAL/PLATELET
Abs Immature Granulocytes: 0.78 10*3/uL — ABNORMAL HIGH (ref 0.00–0.07)
Basophils Absolute: 0.2 10*3/uL — ABNORMAL HIGH (ref 0.0–0.1)
Basophils Relative: 0 %
Eosinophils Absolute: 0 10*3/uL (ref 0.0–0.5)
Eosinophils Relative: 0 %
HCT: 37.9 % — ABNORMAL LOW (ref 39.0–52.0)
Hemoglobin: 12.5 g/dL — ABNORMAL LOW (ref 13.0–17.0)
Immature Granulocytes: 1 %
Lymphocytes Relative: 55 %
Lymphs Abs: 30.9 10*3/uL — ABNORMAL HIGH (ref 0.7–4.0)
MCH: 33.2 pg (ref 26.0–34.0)
MCHC: 33 g/dL (ref 30.0–36.0)
MCV: 100.8 fL — ABNORMAL HIGH (ref 80.0–100.0)
Monocytes Absolute: 3.3 10*3/uL — ABNORMAL HIGH (ref 0.1–1.0)
Monocytes Relative: 6 %
Neutro Abs: 21.5 10*3/uL — ABNORMAL HIGH (ref 1.7–7.7)
Neutrophils Relative %: 38 %
Platelets: 391 10*3/uL (ref 150–400)
RBC: 3.76 MIL/uL — ABNORMAL LOW (ref 4.22–5.81)
RDW: 18.4 % — ABNORMAL HIGH (ref 11.5–15.5)
WBC: 56.6 10*3/uL (ref 4.0–10.5)
nRBC: 0.1 % (ref 0.0–0.2)

## 2019-10-16 LAB — COMPREHENSIVE METABOLIC PANEL
ALT: 15 U/L (ref 0–44)
AST: 25 U/L (ref 15–41)
Albumin: 2.9 g/dL — ABNORMAL LOW (ref 3.5–5.0)
Alkaline Phosphatase: 80 U/L (ref 38–126)
Anion gap: 12 (ref 5–15)
BUN: 36 mg/dL — ABNORMAL HIGH (ref 8–23)
CO2: 21 mmol/L — ABNORMAL LOW (ref 22–32)
Calcium: 8.8 mg/dL — ABNORMAL LOW (ref 8.9–10.3)
Chloride: 101 mmol/L (ref 98–111)
Creatinine, Ser: 1.33 mg/dL — ABNORMAL HIGH (ref 0.61–1.24)
GFR calc Af Amer: 56 mL/min — ABNORMAL LOW (ref >60)
GFR calc non Af Amer: 48 mL/min — ABNORMAL LOW (ref >60)
Glucose, Bld: 297 mg/dL — ABNORMAL HIGH (ref 70–99)
Potassium: 5.2 mmol/L — ABNORMAL HIGH (ref 3.5–5.1)
Sodium: 134 mmol/L — ABNORMAL LOW (ref 135–145)
Total Bilirubin: 1.3 mg/dL — ABNORMAL HIGH (ref 0.3–1.2)
Total Protein: 6.8 g/dL (ref 6.5–8.1)

## 2019-10-16 LAB — POCT I-STAT EG7
Acid-base deficit: 3 mmol/L — ABNORMAL HIGH (ref 0.0–2.0)
Bicarbonate: 23.6 mmol/L (ref 20.0–28.0)
Calcium, Ion: 1.14 mmol/L — ABNORMAL LOW (ref 1.15–1.40)
HCT: 40 % (ref 39.0–52.0)
Hemoglobin: 13.6 g/dL (ref 13.0–17.0)
O2 Saturation: 99 %
Potassium: 5.3 mmol/L — ABNORMAL HIGH (ref 3.5–5.1)
Sodium: 134 mmol/L — ABNORMAL LOW (ref 135–145)
TCO2: 25 mmol/L (ref 22–32)
pCO2, Ven: 45.2 mmHg (ref 44.0–60.0)
pH, Ven: 7.326 (ref 7.250–7.430)
pO2, Ven: 144 mmHg — ABNORMAL HIGH (ref 32.0–45.0)

## 2019-10-16 LAB — LACTIC ACID, PLASMA
Lactic Acid, Venous: 2.9 mmol/L (ref 0.5–1.9)
Lactic Acid, Venous: 2.2 mmol/L (ref 0.5–1.9)

## 2019-10-16 LAB — PROTIME-INR
INR: 7.2 (ref 0.8–1.2)
Prothrombin Time: 62.2 seconds — ABNORMAL HIGH (ref 11.4–15.2)

## 2019-10-16 LAB — APTT: aPTT: 68 seconds — ABNORMAL HIGH (ref 24–36)

## 2019-10-16 LAB — RESPIRATORY PANEL BY RT PCR (FLU A&B, COVID)
Influenza A by PCR: NEGATIVE
Influenza B by PCR: NEGATIVE
SARS Coronavirus 2 by RT PCR: NEGATIVE

## 2019-10-16 LAB — CBG MONITORING, ED
Glucose-Capillary: 278 mg/dL — ABNORMAL HIGH (ref 70–99)
Glucose-Capillary: 291 mg/dL — ABNORMAL HIGH (ref 70–99)

## 2019-10-16 LAB — BRAIN NATRIURETIC PEPTIDE: B Natriuretic Peptide: 423.8 pg/mL — ABNORMAL HIGH (ref 0.0–100.0)

## 2019-10-16 LAB — HEMOGLOBIN A1C
Hgb A1c MFr Bld: 6.9 % — ABNORMAL HIGH (ref 4.8–5.6)
Mean Plasma Glucose: 151.33 mg/dL

## 2019-10-16 MED ORDER — HYDROCORTISONE 1 % EX CREA
TOPICAL_CREAM | Freq: Two times a day (BID) | CUTANEOUS | Status: DC
  Filled 2019-10-16: qty 28

## 2019-10-16 MED ORDER — SODIUM CHLORIDE 0.9 % IV BOLUS
500.0000 mL | Freq: Once | INTRAVENOUS | Status: AC
  Administered 2019-10-16: 500 mL via INTRAVENOUS

## 2019-10-16 MED ORDER — VITAMIN K1 10 MG/ML IJ SOLN
10.0000 mg | Freq: Once | INTRAMUSCULAR | Status: DC
  Filled 2019-10-16: qty 1

## 2019-10-16 MED ORDER — VITAMIN B-12 1000 MCG PO TABS
1000.0000 ug | ORAL_TABLET | Freq: Every day | ORAL | Status: DC
  Administered 2019-10-17 – 2019-10-18 (×2): 1000 ug via ORAL
  Filled 2019-10-16 (×2): qty 1

## 2019-10-16 MED ORDER — MELATONIN 3 MG PO TABS
6.0000 mg | ORAL_TABLET | Freq: Every evening | ORAL | Status: DC | PRN
  Filled 2019-10-16: qty 2

## 2019-10-16 MED ORDER — SODIUM CHLORIDE 0.9 % IV SOLN
500.0000 mg | Freq: Once | INTRAVENOUS | Status: AC
  Administered 2019-10-16: 500 mg via INTRAVENOUS
  Filled 2019-10-16: qty 500

## 2019-10-16 MED ORDER — PREDNISOLONE ACETATE 1 % OP SUSP: 4.0000 [drp] | Freq: Two times a day (BID) | OPHTHALMIC | Status: DC

## 2019-10-16 MED ORDER — HYDROCODONE-ACETAMINOPHEN 5-325 MG PO TABS
1.0000 | ORAL_TABLET | ORAL | Status: DC | PRN
  Administered 2019-10-17 – 2019-10-18 (×4): 2 via ORAL
  Filled 2019-10-16 (×4): qty 2

## 2019-10-16 MED ORDER — PIPERACILLIN-TAZOBACTAM 3.375 G IVPB
3.3750 g | Freq: Three times a day (TID) | INTRAVENOUS | Status: DC
  Administered 2019-10-17 – 2019-10-18 (×5): 3.375 g via INTRAVENOUS
  Filled 2019-10-16 (×5): qty 50

## 2019-10-16 MED ORDER — WARFARIN - PHARMACIST DOSING INPATIENT: Freq: Every day | Status: DC

## 2019-10-16 MED ORDER — PREDNISOLONE ACETATE 1 % OP SUSP
4.0000 [drp] | Freq: Two times a day (BID) | OPHTHALMIC | Status: DC
  Administered 2019-10-17 – 2019-10-18 (×2): 4 [drp] via OPHTHALMIC
  Filled 2019-10-16: qty 5

## 2019-10-16 MED ORDER — IPRATROPIUM BROMIDE 0.02 % IN SOLN
0.5000 mg | RESPIRATORY_TRACT | Status: DC
  Administered 2019-10-16 – 2019-10-17 (×6): 0.5 mg via RESPIRATORY_TRACT
  Filled 2019-10-16 (×6): qty 2.5

## 2019-10-16 MED ORDER — ONDANSETRON HCL 4 MG/2ML IJ SOLN: 4.0000 mg | Freq: Four times a day (QID) | INTRAMUSCULAR | Status: DC | PRN

## 2019-10-16 MED ORDER — FENOFIBRATE 160 MG PO TABS
160.0000 mg | ORAL_TABLET | Freq: Every day | ORAL | Status: DC
  Administered 2019-10-16 – 2019-10-18 (×3): 160 mg via ORAL
  Filled 2019-10-16 (×3): qty 1

## 2019-10-16 MED ORDER — INSULIN ASPART 100 UNIT/ML ~~LOC~~ SOLN
0.0000 [IU] | Freq: Three times a day (TID) | SUBCUTANEOUS | Status: DC
  Administered 2019-10-16 – 2019-10-17 (×2): 5 [IU] via SUBCUTANEOUS
  Administered 2019-10-17: 3 [IU] via SUBCUTANEOUS
  Administered 2019-10-17: 2 [IU] via SUBCUTANEOUS
  Administered 2019-10-18 (×2): 5 [IU] via SUBCUTANEOUS

## 2019-10-16 MED ORDER — ALENDRONATE SODIUM 70 MG PO TABS: 70.0000 mg | ORAL_TABLET | ORAL | Status: DC

## 2019-10-16 MED ORDER — BACID PO TABS
2.0000 | ORAL_TABLET | Freq: Three times a day (TID) | ORAL | Status: DC
  Administered 2019-10-16 – 2019-10-18 (×5): 2 via ORAL
  Filled 2019-10-16 (×8): qty 2

## 2019-10-16 MED ORDER — IPRATROPIUM BROMIDE 0.02 % IN SOLN: 0.5000 mg | RESPIRATORY_TRACT | Status: DC

## 2019-10-16 MED ORDER — GUAIFENESIN ER 600 MG PO TB12
600.0000 mg | ORAL_TABLET | Freq: Two times a day (BID) | ORAL | Status: DC | PRN
  Administered 2019-10-16: 600 mg via ORAL
  Filled 2019-10-16: qty 1

## 2019-10-16 MED ORDER — VITAMIN K1 10 MG/ML IJ SOLN
5.0000 mg | Freq: Once | INTRAVENOUS | Status: AC
  Administered 2019-10-16: 5 mg via INTRAVENOUS
  Filled 2019-10-16: qty 0.5

## 2019-10-16 MED ORDER — HYDROCORTISONE 2.5 % EX CREA: TOPICAL_CREAM | Freq: Two times a day (BID) | CUTANEOUS | Status: DC

## 2019-10-16 MED ORDER — VITAMIN K1 10 MG/ML IJ SOLN
1.0000 mg | Freq: Once | INTRAVENOUS | Status: AC
  Administered 2019-10-16: 1 mg via INTRAVENOUS
  Filled 2019-10-16: qty 0.1

## 2019-10-16 MED ORDER — ONDANSETRON HCL 4 MG PO TABS: 4.0000 mg | ORAL_TABLET | Freq: Four times a day (QID) | ORAL | Status: DC | PRN

## 2019-10-16 MED ORDER — SODIUM CHLORIDE 0.9 % IV SOLN: INTRAVENOUS | Status: DC

## 2019-10-16 MED ORDER — PIPERACILLIN-TAZOBACTAM 3.375 G IVPB 30 MIN
3.3750 g | Freq: Once | INTRAVENOUS | Status: AC
  Administered 2019-10-16: 3.375 g via INTRAVENOUS
  Filled 2019-10-16: qty 50

## 2019-10-16 MED ORDER — SODIUM CHLORIDE 0.9 % IV SOLN
2.0000 g | Freq: Once | INTRAVENOUS | Status: AC
  Administered 2019-10-16: 2 g via INTRAVENOUS
  Filled 2019-10-16: qty 20

## 2019-10-16 MED ORDER — ACETAMINOPHEN 325 MG PO TABS: 650.0000 mg | ORAL_TABLET | Freq: Four times a day (QID) | ORAL | Status: DC | PRN

## 2019-10-16 MED ORDER — ACETAMINOPHEN 650 MG RE SUPP: 650.0000 mg | Freq: Four times a day (QID) | RECTAL | Status: DC | PRN

## 2019-10-16 MED ORDER — HYDRALAZINE HCL 25 MG PO TABS: 25.0000 mg | ORAL_TABLET | Freq: Four times a day (QID) | ORAL | Status: DC | PRN

## 2019-10-16 MED ORDER — GABAPENTIN 100 MG PO CAPS
100.0000 mg | ORAL_CAPSULE | Freq: Two times a day (BID) | ORAL | Status: DC
  Administered 2019-10-16 – 2019-10-18 (×4): 100 mg via ORAL
  Filled 2019-10-16 (×4): qty 1

## 2019-10-16 MED ORDER — ATORVASTATIN CALCIUM 10 MG PO TABS
20.0000 mg | ORAL_TABLET | Freq: Every day | ORAL | Status: DC
  Administered 2019-10-16 – 2019-10-18 (×3): 20 mg via ORAL
  Filled 2019-10-16 (×3): qty 2

## 2019-10-16 MED ORDER — ADULT MULTIVITAMIN W/MINERALS CH
1.0000 | ORAL_TABLET | Freq: Every day | ORAL | Status: DC
  Administered 2019-10-17 – 2019-10-18 (×2): 1 via ORAL
  Filled 2019-10-16 (×2): qty 1

## 2019-10-16 NOTE — Consult Note (Signed)
NAME:  Oscar Baker, MRN:  [id number], DOB:  November 27, 1932, LOS: 0 ADMISSION DATE:  10/06/2019, CONSULTATION DATE:  4/26 REFERRING MD:  zhang, CHIEF COMPLAINT:  Respiratory failure w/ pleural effusion    Brief History   86 year old male admitted 4/26 with acute hypoxic respiratory failure, working diagnosis is pneumonia and pleural effusion  History of present illness   86 year old male with history as mentioned below who presented to the emergency room from his doctor's office with chief complaint of shortness of breath, productive cough, and hypoxia.  Apparently he does have some baseline exertional dyspnea and chronic cough, however this is escalated, and was associated with right-sided pleuritic chest pain and purulent mucus which was green in color.  He reported he was coughing so hard he was at times having posttussive tussive emesis, his shortness of breath had been getting progressively worse in spite of rescue bronchodilators which he had at home.  He was evaluated by his primary care, pulse oximetry noted to be 85%.  Sent to the emergency room for evaluation  Past Medical History  CLL, on surveillance, congestive heart failure, HFrEF, atrial fibrillation on Coumadin coronary artery disease, diabetes type 2, elevated PSA, , Hypertension, prior empyema, on left, chronic cough with chronic aspiration Significant Hospital Events   426 admitted  Consults:  Pulmonary consulted for pleural effusion  Procedures:    Significant Diagnostic Tests:  CT chest 4/26:  1. Dense masslike area of consolidation in the right infrahilar region with marked compression and occlusion of the bronchus intermedius. Findings very suspicious for neoplasm with obstructive atelectasis in the right lower lobe and right middle lobe. Bronchoscopy may be helpful for further evaluation. 2. Large right sided loculated pleural fluid no obvious pleural mass.3. Enlarged mediastinal and hilar lymph nodes progressive  since prior study. This could be inflammatory/reactive or neoplastic.4. Numerous borderline enlarged bilateral axillary and supraclavicular lymph nodes, slightly more pronounced. 5. Advanced atherosclerotic calcifications involving the aorta andcoronary arteries.6. Aortic atherosclerosis.  Micro Data:   Respiratory PCR panel: Negative Antimicrobials:  Ceftriaxone 5/26 Azithromycin 5/26 Zosyn started 5/26 Interim history/subjective:  Feels a little bit better since admission  Objective   Blood pressure (Abnormal) 132/49, pulse 63, temperature (Abnormal) 97.4 F (36.3 C), temperature source Axillary, resp. rate (Abnormal) 28, height 5\' 10"  (1.778 m), weight 96 kg, SpO2 93 %.        Intake/Output Summary (Last 24 hours) at 09/21/2019 1517 Last data filed at 10/04/2019 1501 Gross per 24 hour  Intake 308.33 ml  Output 125 ml  Net 183.33 ml   Filed Weights   10/20/2019 1240  Weight: 96 kg    Examination: General: Very pleasant 86 year old white male currently resting in bed he is in no acute distress currently HENT: Normocephalic atraumatic no jugular venous distention does have upper airway rhonchi Lungs: Diffuse rhonchi, no accessory use currently.  Currently on 9 L high flow nasal cannula Cardiovascular: Regular irregular, atrial fibrillation on monitor Abdomen: Soft not tender no organomegaly Extremities: Lower extremity edema present bilaterally pulses are strong brisk cap refill Neuro: Hard of hearing, but alert oriented without focal deficits GU: Due to void  Resolved Hospital Problem list     Assessment & Plan:  Acute hypoxic respiratory failure in the setting of right lower lobe pneumonia (aspiration vs possibly post-obstructive) with loculated right pleural effusion; can't exclude underlying malignancy  Has known history of chronic aspiration Plan Admit to stepdown unit per internal medicine Supplemental oxygen Empiric antibiotics, agree with IV Zosyn to  cover for  pseudomonal coverage as well as aspiration Send urine strep and Legionella antigen Pulmonary hygiene Hold anticoagulation, will reevaluate in the morning, will need  Nixxon catheter placement.  I suspect he will need TPA and DNase   Severe sepsis (POA) secondary to pneumonia, no organism specified to date WBC count chronically elevated in setting of CLL Plan Follow-up lactic acid Provide gentle hydration Antibiotics as above  Chronic atrial fibrillation, on anticoagulation Plan Continuing rate control Holding anticoagulation in anticipation of diagnostic thoracentesis  Coumadin induced coagulopathy.  Currently INR 7.2 No evidence of bleeding Plan Hold coumadin for now Administering vitamin K A.m. INR  AKI in setting of sepsis Plan Cont IVFs Am chemistry  Strict I&O  HFrEF, currently appears to be compensated Plan Continue home medications, Telemetry monitoring  Mild hyperkalemia Plan Trend   History of CLL, currently on surveillance Plan Primary team is contacting med/onc   Best practice:  Diet: reg Pain/Anxiety/Delirium protocol (if indicated): na VAP protocol (if indicated): na DVT prophylaxis: holding  GI prophylaxis: na Glucose control: ssi Mobility: BR Code Status: dnr Family Communication: wife updated  Disposition: admit   Labs   CBC: Recent Labs  Lab 10/04/2019 1305 10/12/2019 1337  WBC 56.6*  --   NEUTROABS 21.5*  --   HGB 12.5* 13.6  HCT 37.9* 40.0  MCV 100.8*  --   PLT 391  --     Basic Metabolic Panel: Recent Labs  Lab 10/11/2019 1305 10/05/2019 1337  NA 134* 134*  K 5.2* 5.3*  CL 101  --   CO2 21*  --   GLUCOSE 297*  --   BUN 36*  --   CREATININE 1.33*  --   CALCIUM 8.8*  --    GFR: Estimated Creatinine Clearance: 47.2 mL/min (A) (by C-G formula based on SCr of 1.33 mg/dL (H)). Recent Labs  Lab 10/19/2019 1255 10/20/2019 1305  WBC  --  56.6*  LATICACIDVEN 2.9*  --     Liver Function Tests: Recent Labs  Lab 10/10/2019 1305   AST 25  ALT 15  ALKPHOS 80  BILITOT 1.3*  PROT 6.8  ALBUMIN 2.9*   No results for input(s): LIPASE, AMYLASE in the last 168 hours. No results for input(s): AMMONIA in the last 168 hours.  ABG    Component Value Date/Time   PHART 7.449 12/01/2017 0744   PCO2ART 40.3 12/01/2017 0744   PO2ART 58.0 (L) 12/01/2017 0744   HCO3 23.6 09/24/2019 1337   TCO2 25 10/03/2019 1337   ACIDBASEDEF 3.0 (H) 10/15/2019 1337   O2SAT 99.0 10/07/2019 1337     Coagulation Profile: Recent Labs  Lab 10/01/2019 1305  INR 7.2*    Cardiac Enzymes: No results for input(s): CKTOTAL, CKMB, CKMBINDEX, TROPONINI in the last 168 hours.  HbA1C: Hgb A1c MFr Bld  Date/Time Value Ref Range Status  03/10/2019 05:54 AM 6.4 (H) 4.8 - 5.6 % Final    Comment:    (NOTE)         Prediabetes: 5.7 - 6.4         Diabetes: >6.4         Glycemic control for adults with diabetes: <7.0   05/05/2016 12:17 PM 6.0 (H) 4.8 - 5.6 % Final    Comment:    (NOTE)         Pre-diabetes: 5.7 - 6.4         Diabetes: >6.4         Glycemic control for adults with diabetes: <  7.0     CBG: No results for input(s): GLUCAP in the last 168 hours.  Review of Systems:   Review of Systems  Constitutional: Positive for malaise/fatigue.  HENT: Positive for hearing loss.   Eyes: Negative.   Respiratory: Positive for cough, sputum production and shortness of breath.        Cough worse than baseline. Known aspiration history. Now w/ green yellow sputum   Cardiovascular: Positive for chest pain and leg swelling.       Right pleuritic chest pain   Gastrointestinal: Negative.   Genitourinary: Negative.   Musculoskeletal: Positive for back pain.  Skin: Negative.   Neurological: Negative.   Endo/Heme/Allergies: Negative.   Psychiatric/Behavioral: Negative.      Past Medical History  He,  has a past medical history of Asthma (1950's), Atrial fibrillation (HCC), Bilateral carpal tunnel syndrome, Bilateral lower extremity edema,  Bladder tumor, Chronic systolic heart failure ([place]), CLL (chronic lymphocytic leukemia) ([place]) (oncologist-  dr Ilene Qua--), Coronary artery disease, Deafness in right ear, Diabetes mellitus type 2, noninsulin dependent ([place]), Elevated PSA, Hematuria (04/2017), History of ear infection, History of MI (myocardial infarction), History of shingles (08/2017), Hyperlipidemia, Hypertension, Ischemic cardiomyopathy (09/01/2017), OA (osteoarthritis), Pacemaker (02/08/2014), Permanent atrial fibrillation ([place]), Pneumonia (2019), Prostate cancer [facility]) (urologist-  dr Diona Fanti), RBBB (right bundle branch block), Sick sinus syndrome ([place]), Urinary incontinence, and Wears hearing aid in right ear.   Surgical History    Past Surgical History:  Procedure Laterality Date  . APPENDECTOMY    . BACK SURGERY     disk  . CARDIAC CATHETERIZATION  09-03-1999  dr Cathie Olden   abnormal cardiolite study:  minor luminal irregularities but no critial coronary artery stenosis  . CARDIOVASCULAR STRESS TEST  08-18-2017  dr Cathie Olden   Intermediate risk nuclear study w/ large area inferior infarction, no evidence of ishcemia (consistant w/ prior MI)/  study not gated due to frequent PVCs  . CARPAL TUNNEL RELEASE Right 2000  . CARPAL TUNNEL RELEASE Left 11/19/2009  . CATARACT EXTRACTION Right 07/2015  . CATARACT EXTRACTION Left 09/2015  . CYSTOSCOPY N/A 11/04/2017   Procedure: CYSTOSCOPY AND CAUTERIZATION OF BLADDER;  Surgeon: Franchot Gallo, MD;  Location: [facility];  Service: Urology;  Laterality: N/A;  . INSERTION PENILE PROSTHESIS  02-22-2004    dr Mattie Marlin  [place]  . IR THORACENTESIS ASP PLEURAL SPACE W/IMG GUIDE  11/26/2017  . KNEE ARTHROSCOPY Left 07/2010  . ORIF PERIPROSTHETIC FRACTURE Right 03/15/2019   Procedure: OPEN REDUCTION INTERNAL FIXATION (ORIF) PERIPROSTHETIC FRACTURE WITH ZIMMER CABLES;  Surgeon: Paralee Cancel, MD;  Location: [place];  Service: Orthopedics;  Laterality: Right;  . PERMANENT PACEMAKER  INSERTION N/A 02/08/2014   Procedure: PERMANENT PACEMAKER INSERTION;  Surgeon: Evans Lance, MD;  Location: [facility] CATH LAB;  Service: Cardiovascular;  Laterality: N/A;  . PLEURAL EFFUSION DRAINAGE Left 11/30/2017   Procedure: DRAINAGE OF HEMOTHORAX;  Surgeon: Ivin Poot, MD;  Location: [place];  Service: Thoracic;  Laterality: Left;  . RADICAL RETROPUBIC PROSTATECTOMY W/ BILATERAL PELVIC LYMPH NODE DISSECTION  11-28-2002   dr Mattie Marlin  [facility]  . TONSILLECTOMY    . TOTAL HIP ARTHROPLASTY Left 05/12/2016   Procedure: LEFT TOTAL HIP ARTHROPLASTY ANTERIOR APPROACH;  Surgeon: Paralee Cancel, MD;  Location: [facility];  Service: Orthopedics;  Laterality: Left;  . TOTAL HIP ARTHROPLASTY Right 07-15-2006   dr Alvan Dame  [facility]  . TRANSTHORACIC ECHOCARDIOGRAM  07/20/2017   mild LVH, ef 45-50%, hypokinesis of the basal-midinferior myocardium, due to AFib  unable to evaluate diastolic function/  severe LAE and RAE/  trivial PR and TR  . VIDEO ASSISTED THORACOSCOPY Left 11/30/2017   Procedure: VIDEO ASSISTED THORACOSCOPY;  Surgeon: Ivin Poot, MD;  Location: [place];  Service: Thoracic;  Laterality: Left;  [name] VIDEO ASSISTED THORACOSCOPY (VATS)/EMPYEMA Left 11/29/2017   Procedure: VIDEO ASSISTED THORACOSCOPY (VATS)/EMPYEMA;  Surgeon: Ivin Poot, MD;  Location: [place];  Service: Thoracic;  Laterality: Left;     Social History   reports that he quit smoking about 43 years ago. His smoking use included pipe and cigars. He quit after 25.00 years of use. He has never used smokeless tobacco. He reports current alcohol use. He reports that he does not use drugs.   Family History   His family history includes Allergic rhinitis in his brother and mother; Emphysema in his mother; Heart attack in his father; Pulmonary fibrosis in his brother.   Allergies Allergies  Allergen Reactions  . Altace [Ramipril] Other (See Comments)    "throat felt like had a knot in it"  . Augmentin [Amoxicillin-Pot Clavulanate] Diarrhea     severe  . Codeine Nausea And Vomiting    Nausea and vomiting   . Simvastatin Other (See Comments)    Leg aches     Home Medications  Prior to Admission medications   Medication Sig Start Date End Date Taking? Authorizing Provider  acetaminophen (TYLENOL) 325 MG tablet Take 2 tablets (650 mg total) by mouth every 4 (four) hours as needed for mild pain. 04/03/19   Love, Ivan Anchors, PA-C  alendronate (FOSAMAX) 70 MG tablet Take 70 mg by mouth once a week. 07/12/19   [provider]  atorvastatin (LIPITOR) 20 MG tablet TAKE ONE TABLET DAILY AT 6PM 09/06/19   Nahser, Wonda Cheng, MD  carvedilol (COREG) 3.125 MG tablet TAKE ONE TABLET TWICE DAILY 09/07/18   Evans Lance, MD  ciprofloxacin (CILOXAN) 0.3 % ophthalmic solution 4 drops 2 (two) times daily. 07/24/19   [provider]  fenofibrate micronized (LOFIBRA) 200 MG capsule TAKE ONE CAPSULE EACH DAY BEFORE BREAKFAST 07/12/19   Nahser, Wonda Cheng, MD  furosemide (LASIX) 20 MG tablet Take 1 tablet (20 mg total) by mouth daily. 04/11/19   Love, Ivan Anchors, PA-C  gabapentin (NEURONTIN) 300 MG capsule TAKE ONE CAPSULE TWICE A DAY 06/19/19   Olalere, Adewale A, MD  glipiZIDE (GLUCOTROL XL) 2.5 MG 24 hr tablet Take 2.5 mg by mouth. Take 1 tablet daily if blood glucose >140    [provider]  guaiFENesin (MUCINEX) 600 MG 12 hr tablet Take 600 mg by mouth 2 (two) times daily as needed for cough.     [provider]  HYDROcodone-homatropine (HYCODAN) 5-1.5 MG/5ML syrup Take 5 mLs by mouth 2 times daily at 12 noon and 4 pm. As needed    [provider]  hydrocortisone 2.5 % cream Apply topically 2 (two) times daily. to affected area 02/01/19   [provider]  ipratropium (ATROVENT) 0.02 % nebulizer solution TAKE 2.60mL BY NEBULIZER 3 TIMES DAILY 03/01/19   Olalere, Adewale A, MD  ketoconazole (NIZORAL) 2 % shampoo 5 APPLICATIONS TO SCALP 2 TO 3 TIMES A WEEK 01/09/19   [provider]  metFORMIN (GLUCOPHAGE)  1000 MG tablet Take 1,000 mg by mouth every morning.    [provider]  metFORMIN (GLUCOPHAGE) 850 MG tablet Take 850 mg by mouth at bedtime.    [provider]  methocarbamol (ROBAXIN) 500 MG tablet Robaxin 500  mg tablet   500 mg by oral route. 03/16/19   [provider]  mometasone (ELOCON) 0.1 % cream APPLY TO EXTERNAL EAR ONCE A DAY FOR 7 DAYS THEN AS NEEDED FOR ITCHING 07/24/19   [provider]  Multiple Vitamin (MULTIVITAMIN WITH MINERALS) TABS tablet Take 1 tablet by mouth daily.    [provider]  [facility] ULTRA test strip 1 each by Other route daily. 06/12/19   [provider]  prednisoLONE acetate (PRED FORTE) 1 % ophthalmic suspension 4 drops 2 (two) times daily. 07/24/19   [provider]  traMADol (ULTRAM) 50 MG tablet Take 1 tablet (50 mg total) by mouth every 12 (twelve) hours as needed for severe pain. 04/11/19   Love, Ivan Anchors, PA-C  vitamin B-12 (CYANOCOBALAMIN) 1000 MCG tablet Take 1,000 mcg by mouth daily.     [provider]  warfarin (COUMADIN) 5 MG tablet TAKE AS DIRECTED BY COUMADIN CLINIC 10/12/19   Nahser, Wonda Cheng, MD     Critical care time: [place] ACNP-BC [place] Pager # [phone] OR # [phone] if no answer

## 2019-10-16 NOTE — ED Notes (Signed)
Pt O2 reading 80% on 15L/m HFNC. 10L/m NRB started with HFNC. O2 reading 85%

## 2019-10-16 NOTE — ED Triage Notes (Addendum)
Pt sent here from his pcp's office for sob and "gurgling sounds" in lungs.  Sats were 85% on RA at pcp's office.  Sats increased to 95% on 4L.  RR 34.

## 2019-10-16 NOTE — H&P (Signed)
History and Physical    DRAPER GALLON ZOX:[id number] DOB: Jan 06, 1933 DOA: 09/21/2019  PCP: [name], MD   Patient coming from: Home  I have personally briefly reviewed patient's old medical records in [place]  Chief Complaint: Cough, short of breath and chest pain  HPI: Oscar Baker is a 86 y.o. male with medical history significant of CLL on remission, CAD, borderline systolic CHF chronic, A. fib on Coumadin, IIDM, HTN, HLD, SSS status post PPM, presented with increasing short of breath dry cough and right-sided pleuritic chest pain for 3 days.  Symptoms started suddenly 3 days ago, cough was dry at the beginning and then became productive of greenish sputum, denied any fever or chills, chest pain started 2 days ago, sharp-like located on the right sided rib cage, worsening with cough and movement.  Before this, patient had a few days of loose bowel movement, but denied any nauseous vomiting or abdominal pain.  And diarrhea subsided few days ago. ED Course: CT showed right-sided pneumonia probably obstructive, question of a right-sided lung mass, and large new onset of right-sided pleural effusion, WBC 56,000, elevated of lymphocyte count 30,000 compared to 20,000 baseline. Review of Systems: As per HPI otherwise 10 point review of systems negative.    Past Medical History:  Diagnosis Date  . Asthma 1950's   history of  . Atrial fibrillation ([place])   . Bilateral carpal tunnel syndrome   . Bilateral lower extremity edema   . Bladder tumor   . Chronic systolic heart failure (HCC)    Echo 1/19: Mild LVH, EF 45-50, inf HK, MAC, severe LAE, severe RAE // Echo 7/15: Mild LVH, mod focal basal sept hypertrophy, EF 55-60, AV peak and mean 16/9, trivial MR, mod LAE, PASP 38  . CLL (chronic lymphocytic leukemia) [facility]) oncologist-  dr Ilene Qua--   dx [phone] ;  Lymphocytosis, CLL - per lov note 05-11-2017 currently under active survillance,  CT 04-17-2014 show very small lymphadenopathy,  no indication for treatment  . Coronary artery disease    cardiologist-  dr Cathie Olden--  08-18-2017 Intermittant risk nuclear study w/ large area of inferior infartion with no evidence ishcemia   . 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 Diabetes mellitus type 2, noninsulin dependent ([place])   . Elevated PSA    since prostatectomy but now resolved  . Hematuria 04/2017  . History of ear infection    Right  . History of MI (myocardial infarction)    per myoview nuclear study 08-18-2017 , unknown when  . History of shingles 08/2017   L ear and scalp, possible  . Hyperlipidemia   . Hypertension   . Ischemic cardiomyopathy 09/01/2017   Presumed +CAD with Nuclear stress test 08/18/17 - Inferior scar, no ischemia, intermediate risk // med management unless +angina or worse dyspnea  . OA (osteoarthritis)   . Pacemaker 02/08/2014   followed by dr g. taylor--  single chamber Biotronik due to SSS  . Permanent atrial fibrillation ([place])   . Pneumonia 2019   Left lung  . Prostate cancer [facility]) urologist-  dr Diona Fanti   dx 2004--  Gleason 8, PSA 10.45--  11-28-2002  s/p  radical prostatectomy;  recurrent w/ increasing PSA, started ADT treatment  . RBBB (right bundle branch block)   . Sick sinus syndrome ([place])    a-Flutter with episodes of bradycardia; S/P Biotronik (serial number [id number]) 02-08-2014  . Urinary incontinence   . Wears hearing aid in right ear    receiver  and transmitter    Past Surgical History:  Procedure Laterality Date  . APPENDECTOMY    . BACK SURGERY     disk  . CARDIAC CATHETERIZATION  09-03-1999  dr Cathie Olden   abnormal cardiolite study:  minor luminal irregularities but no critial coronary artery stenosis  . CARDIOVASCULAR STRESS TEST  08-18-2017  dr Cathie Olden   Intermediate risk nuclear study w/ large area inferior infarction, no evidence of ishcemia (consistant w/ prior MI)/  study not gated due to frequent PVCs  . CARPAL TUNNEL RELEASE Right 2000  . CARPAL TUNNEL RELEASE Left 11/19/2009    . CATARACT EXTRACTION Right 07/2015  . CATARACT EXTRACTION Left 09/2015  . CYSTOSCOPY N/A 11/04/2017   Procedure: CYSTOSCOPY AND CAUTERIZATION OF BLADDER;  Surgeon: Franchot Gallo, MD;  Location: [facility];  Service: Urology;  Laterality: N/A;  . INSERTION PENILE PROSTHESIS  02-22-2004    dr Mattie Marlin  [facility]  . IR THORACENTESIS ASP PLEURAL SPACE W/IMG GUIDE  11/26/2017  . KNEE ARTHROSCOPY Left 07/2010  . ORIF PERIPROSTHETIC FRACTURE Right 03/15/2019   Procedure: OPEN REDUCTION INTERNAL FIXATION (ORIF) PERIPROSTHETIC FRACTURE WITH ZIMMER CABLES;  Surgeon: Paralee Cancel, MD;  Location: [facility];  Service: Orthopedics;  Laterality: Right;  . PERMANENT PACEMAKER INSERTION N/A 02/08/2014   Procedure: PERMANENT PACEMAKER INSERTION;  Surgeon: Evans Lance, MD;  Location[facility] CATH LAB;  Service: Cardiovascular;  Laterality: N/A;  . PLEURAL EFFUSION DRAINAGE Left 11/30/2017   Procedure: DRAINAGE OF HEMOTHORAX;  Surgeon: Ivin Poot, MD;  Location: [place];  Service: Thoracic;  Laterality: Left;  . RADICAL RETROPUBIC PROSTATECTOMY W/ BILATERAL PELVIC LYMPH NODE DISSECTION  11-28-2002   dr Mattie Marlin  [facility]  . TONSILLECTOMY    . TOTAL HIP ARTHROPLASTY Left 05/12/2016   Procedure: LEFT TOTAL HIP ARTHROPLASTY ANTERIOR APPROACH;  Surgeon: Paralee Cancel, MD;  Location: [facility];  Service: Orthopedics;  Laterality: Left;  . TOTAL HIP ARTHROPLASTY Right 07-15-2006   dr Alvan Dame  [facility]  . TRANSTHORACIC ECHOCARDIOGRAM  07/20/2017   mild LVH, ef 45-50%, hypokinesis of the basal-midinferior myocardium, due to AFib unable to evaluate diastolic function/  severe LAE and RAE/  trivial PR and TR  . VIDEO ASSISTED THORACOSCOPY Left 11/30/2017   Procedure: VIDEO ASSISTED THORACOSCOPY;  Surgeon: Ivin Poot, MD;  Location: [facility];  Service: Thoracic;  Laterality: Left;  [name] VIDEO ASSISTED THORACOSCOPY (VATS)/EMPYEMA Left 11/29/2017   Procedure: VIDEO ASSISTED THORACOSCOPY (VATS)/EMPYEMA;  Surgeon: Ivin Poot,  MD;  Location: [place];  Service: Thoracic;  Laterality: Left;     reports that he quit smoking about 43 years ago. His smoking use included pipe and cigars. He quit after 25.00 years of use. He has never used smokeless tobacco. He reports current alcohol use. He reports that he does not use drugs.  Allergies  Allergen Reactions  . Altace [Ramipril] Other (See Comments)    "throat felt like had a knot in it"  . Augmentin [Amoxicillin-Pot Clavulanate] Diarrhea    severe  . Codeine Nausea And Vomiting    Nausea and vomiting   . Simvastatin Other (See Comments)    Leg aches    Family History  Problem Relation Age of Onset  . Emphysema Mother   . Allergic rhinitis Mother   . Heart attack Father   . Allergic rhinitis Brother   . Pulmonary fibrosis Brother      Prior to Admission medications   Medication Sig Start Date End Date Taking? Authorizing Provider  acetaminophen (  TYLENOL) 325 MG tablet Take 2 tablets (650 mg total) by mouth every 4 (four) hours as needed for mild pain. 04/03/19  Yes Love, Ivan Anchors, PA-C  alendronate (FOSAMAX) 70 MG tablet Take 70 mg by mouth once a week. 07/12/19  Yes [provider]  atorvastatin (LIPITOR) 20 MG tablet TAKE ONE TABLET DAILY AT 6PM Patient taking differently: Take 20 mg by mouth daily.  09/06/19  Yes Nahser, Wonda Cheng, MD  carvedilol (COREG) 3.125 MG tablet TAKE ONE TABLET TWICE DAILY Patient taking differently: Take 3.125 mg by mouth in the morning and at bedtime.  09/07/18  Yes Evans Lance, MD  ciprofloxacin (CILOXAN) 0.3 % ophthalmic solution Place 4 drops into both eyes 2 (two) times daily.  07/24/19  Yes [provider]  fenofibrate micronized (LOFIBRA) 200 MG capsule TAKE ONE CAPSULE EACH DAY BEFORE BREAKFAST Patient taking differently: Take 200 mg by mouth daily.  07/12/19  Yes Nahser, Wonda Cheng, MD  furosemide (LASIX) 20 MG tablet Take 1 tablet (20 mg total) by mouth daily. 04/11/19  Yes Love, Ivan Anchors, PA-C  glipiZIDE  (GLUCOTROL XL) 2.5 MG 24 hr tablet Take 2.5 mg by mouth. Take 1 tablet daily if blood glucose >140   Yes [provider]  guaiFENesin (MUCINEX) 600 MG 12 hr tablet Take 600 mg by mouth 2 (two) times daily as needed for cough.    Yes [provider]  HYDROcodone-homatropine (HYCODAN) 5-1.5 MG/5ML syrup Take 5 mLs by mouth 2 times daily at 12 noon and 4 pm. As needed   Yes [provider]  hydrocortisone 2.5 % cream Apply 1 application topically 2 (two) times daily as needed (for itching). to affected area 02/01/19  Yes [provider]  ipratropium (ATROVENT) 0.02 % nebulizer solution TAKE 2.54m BY NEBULIZER 3 TIMES DAILY Patient taking differently: Take 0.25 mg by nebulization See admin instructions. Takes 2 to 3 times daily as needed for wheezing or shortness of breath. 03/01/19  Yes Olalere, Adewale A, MD  ketoconazole (NIZORAL) 2 % shampoo Apply 1 application topically See admin instructions. 2 to 3 times a week. 01/09/19  Yes [provider]  metFORMIN (GLUCOPHAGE) 1000 MG tablet Take 1,000 mg by mouth every morning.   Yes [provider]  metFORMIN (GLUCOPHAGE) 850 MG tablet Take 850 mg by mouth at bedtime.   Yes [provider]  mometasone (ELOCON) 0.1 % cream Apply 1 application topically daily as needed (for itching).  07/24/19  Yes [provider]  Multiple Vitamin (MULTIVITAMIN WITH MINERALS) TABS tablet Take 1 tablet by mouth daily.   Yes [provider]  traMADol (ULTRAM) 50 MG tablet Take 1 tablet (50 mg total) by mouth every 12 (twelve) hours as needed for severe pain. 04/11/19  Yes Love, PIvan Anchors PA-C  vitamin B-12 (CYANOCOBALAMIN) 1000 MCG tablet Take 1,000 mcg by mouth daily.    Yes [provider]  warfarin (COUMADIN) 5 MG tablet TAKE AS DIRECTED BY COUMADIN CLINIC Patient taking differently: Take 5 mg by mouth daily.  10/12/19  Yes Nahser, PWonda Cheng MD  gabapentin (NEURONTIN) 300 MG capsule TAKE ONE  CAPSULE TWICE A DAY Patient not taking: Reported on 10/01/2019 06/19/19   OLaurin Coder MD  [facility] test strip 1 each by Other route daily. 06/12/19   [provider]    Physical Exam: Vitals:   10/12/2019 1530 10/12/2019 1600 10/08/2019 1630 10/10/2019 1700  BP: (!) 142/59 136/60 (!) 136/54 (!) 128/50  Pulse: 64 66  63 62  Resp: 20 (!) 27 (!) 24 (!) 34  Temp:      TempSrc:      SpO2: 91% 91% 91% 93%  Weight:      Height:        Constitutional: NAD, calm, comfortable Vitals:   10/08/2019 1530 10/11/2019 1600 09/24/2019 1630 10/10/2019 1700  BP: (!) 142/59 136/60 (!) 136/54 (!) 128/50  Pulse: 64 66 63 62  Resp: 20 (!) 27 (!) 24 (!) 34  Temp:      TempSrc:      SpO2: 91% 91% 91% 93%  Weight:      Height:       Eyes: PERRL, lids and conjunctivae normal ENMT: Mucous membranes are moist. Posterior pharynx clear of any exudate or lesions.Normal dentition.  Neck: normal, supple, no masses, no thyromegaly Respiratory: Significant decrease in all breathing sound on the right side with few scattered crackles. Normal respiratory effort. No accessory muscle use.  Cardiovascular: Irregular heart rate no murmurs / rubs / gallops. No extremity edema. 2+ pedal pulses. No carotid bruits.  Abdomen: no tenderness, no masses palpated. No hepatosplenomegaly. Bowel sounds positive.  Musculoskeletal: no clubbing / cyanosis. No joint deformity upper and lower extremities. Good ROM, no contractures. Normal muscle tone.  Skin: no rashes, lesions, ulcers. No induration Neurologic: CN 2-12 grossly intact. Sensation intact, DTR normal. Strength 5/5 in all 4.  Psychiatric: Normal judgment and insight. Alert and oriented x 3. Normal mood.     Labs on Admission: I have personally reviewed following labs and imaging studies  CBC: Recent Labs  Lab 09/21/2019 1305 10/02/2019 1337  WBC 56.6*  --   NEUTROABS 21.5*  --   HGB 12.5* 13.6  HCT 37.9* 40.0  MCV 100.8*  --   PLT 391  --    Basic  Metabolic Panel: Recent Labs  Lab 10/09/2019 1305 10/12/2019 1337  NA 134* 134*  K 5.2* 5.3*  CL 101  --   CO2 21*  --   GLUCOSE 297*  --   BUN 36*  --   CREATININE 1.33*  --   CALCIUM 8.8*  --    GFR: Estimated Creatinine Clearance: 47.2 mL/min (A) (by C-G formula based on SCr of 1.33 mg/dL (H)). Liver Function Tests: Recent Labs  Lab 10/11/2019 1305  AST 25  ALT 15  ALKPHOS 80  BILITOT 1.3*  PROT 6.8  ALBUMIN 2.9*   No results for input(s): LIPASE, AMYLASE in the last 168 hours. No results for input(s): AMMONIA in the last 168 hours. Coagulation Profile: Recent Labs  Lab 10/06/2019 1305  INR 7.2*   Cardiac Enzymes: No results for input(s): CKTOTAL, CKMB, CKMBINDEX, TROPONINI in the last 168 hours. BNP (last 3 results) No results for input(s): PROBNP in the last 8760 hours. HbA1C: Recent Labs    10/17/2019 1454  HGBA1C 6.9*   CBG: No results for input(s): GLUCAP in the last 168 hours. Lipid Profile: No results for input(s): CHOL, HDL, LDLCALC, TRIG, CHOLHDL, LDLDIRECT in the last 72 hours. Thyroid Function Tests: No results for input(s): TSH, T4TOTAL, FREET4, T3FREE, THYROIDAB in the last 72 hours. Anemia Panel: No results for input(s): VITAMINB12, FOLATE, FERRITIN, TIBC, IRON, RETICCTPCT in the last 72 hours. Urine analysis:    Component Value Date/Time   COLORURINE YELLOW 11/27/2017 1454   APPEARANCEUR HAZY (A) 11/27/2017 1454   LABSPEC 1.018 11/27/2017 1454   PHURINE 7.0 11/27/2017 1454   GLUCOSEU >=500 (A) 11/27/2017 1454   HGBUR SMALL (A) 11/27/2017  [place] 11/27/2017 [place] 11/27/2017 1454   PROTEINUR NEGATIVE 11/27/2017 1454   NITRITE NEGATIVE 11/27/2017 1454   LEUKOCYTESUR MODERATE (A) 11/27/2017 1454    Radiological Exams on Admission: CT CHEST WO CONTRAST  Result Date: 10/03/2019 CLINICAL DATA:  Shortness of breath. EXAM: CT CHEST WITHOUT CONTRAST TECHNIQUE: Multidetector CT imaging of the chest was  performed following the standard protocol without IV contrast. COMPARISON:  Chest CT 11/23/2017 and chest x-ray from today. FINDINGS: Cardiovascular: The heart is mildly enlarged. No pericardial effusion. There is tortuosity, ectasia and calcification of the thoracic aorta. Extensive coronary artery calcifications are noted. Mediastinum/Nodes: Enlarged mediastinal and hilar lymph nodes progressive since prior study. This could be inflammatory/reactive. 11 mm right paratracheal nodes on image number 52/3. 21 mm subcarinal lymph node on image number 73/3. The esophagus is grossly normal. Lungs/Pleura: There is a large loculated right pleural fluid collection along with fluid in the major and minor fissures. This measures as simple fluid. I do not think this is pleural tumor. Empyema is possible. Dense masslike area of consolidation in the right infrahilar region. Could not exclude neoplasm with obstructive atelectasis in the right lower lobe and right middle lobe. T the bronchus intermedius is markedly compressed and occluded distally. The left lung is relatively clear. Some streaky areas of atelectasis. Upper Abdomen: No significant upper abdominal findings. No hepatic or adrenal gland lesions are identified. Advanced aortic calcifications. Musculoskeletal: Numerous borderline enlarged bilateral axillary and supraclavicular lymph nodes. These were also present on the prior CT scan but are slightly more pronounced. No significant bony findings. Advanced degenerative changes involving the spine. IMPRESSION: 1. Dense masslike area of consolidation in the right infrahilar region with marked compression and occlusion of the bronchus intermedius. Findings very suspicious for neoplasm with obstructive atelectasis in the right lower lobe and right middle lobe. Bronchoscopy may be helpful for further evaluation. 2. Large right sided loculated pleural fluid no obvious pleural mass. 3. Enlarged mediastinal and hilar lymph nodes  progressive since prior study. This could be inflammatory/reactive or neoplastic. 4. Numerous borderline enlarged bilateral axillary and supraclavicular lymph nodes, slightly more pronounced. 5. Advanced atherosclerotic calcifications involving the aorta and coronary arteries. 6. Aortic atherosclerosis. Aortic Atherosclerosis (ICD10-I70.0). Aortic Atherosclerosis (ICD10-I70.0). Electronically Signed   By: Marijo Sanes M.D.   On: 10/06/2019 15:33   DG Chest Port 1 View  Result Date: 09/23/2019 CLINICAL DATA:  Short of breath, cough, hypoxia EXAM: PORTABLE CHEST 1 VIEW COMPARISON:  04/09/2019 FINDINGS: Single frontal view of the chest demonstrates stable single lead pacemaker. Cardiac silhouette is unchanged. There is new veiling opacity at the right lung base consistent with consolidation and effusion. No pneumothorax. IMPRESSION: 1. Right basilar consolidation and effusion, compatible with pneumonia. Electronically Signed   By: Randa Ngo M.D.   On: 10/02/2019 13:17    EKG: Independently reviewed.  A. fib with 3:1 transduction  Assessment/Plan Active Problems:   Sepsis ([place])  Sepsis -Evidenced with proximal, tachypneic, increased lactic acid and white count, source is right-sided pneumonia likely obstructive, aspiration less likely given history. -Escalate antibiotic to Zosyn to cover anaerobic -There are also a question about the nature of the right-sided pleural effusion, empyema versus malignancy given the CT chest report showing.  Discussed with on-call pulmonologist, who will come to see the patient and decide whether a thoracentesis needed emergently. -Discussed with patient wife at bedside, prognosis guarded.  Patient is DNR, he does not want to be intubated no matter what.  Acute hypoxic respiratory failure secondary to right-sided pneumonia and right-sided large pleural effusion As above, now stabilized on 9 L of high flow oxygen.  Leukocytosis and lymphocytosis Discussed with  on-call oncologist, who will come and review patient peripheral smear  Coumadin induced coagulopathy No symptoms or signs of active bleeding, received 1 dose of vitamin K today Hold Coumadin and recheck INR tomorrow No chemical DVT prophylaxis for today  AKI Hold lasix, but will not start maintenance IV fluid given his history of systolic CHF and his blood pressure has been stable.  Chronic systolic CHF Monitor volume status, currently he is probably mildly hypokalemia, hold Lasix for now  HTN PRN BP meds only for now  IIDM Hold all p.o. diabetic medications and start sliding scale  Deconditioning Hold PT evaluation until breathing status stabilized  DVT prophylaxis: SCD for now Code Status: DNR Family Communication: Wife at bedside Disposition Plan: Patient is static, likely will need at least 5 to 7 days hospital stay Consults called: Pulmonary, oncology Dr. Marin Olp Admission status: PCU  Lequita Halt MD [facility] Pager [phone]    09/27/2019, 5:15 PM

## 2019-10-16 NOTE — ED Notes (Signed)
This nurse NT suctioned pt d/t noisy, wet breath sounds and pt unable to maintain O2 saturation. Copious thick/yellow secretions were retrieved. Pt still unable to maintain sats above 89% on 15 lpm HFNC

## 2019-10-16 NOTE — Progress Notes (Signed)

## 2019-10-16 NOTE — ED Provider Notes (Signed)
Oscar Baker Provider Note   CSN: [id number] Arrival date & time: 09/22/2019  1227     History Chief Complaint  Patient presents with  . Shortness of Breath    Oscar Baker is a 86 y.o. male.  Patient is an 86 year old male with a history of diabetes, permanent A. fib on Coumadin, CHF, CLL, ischemic cardiomyopathy with EF of 45 to 50% 2 years ago who is presenting today from his doctor's office with cough, shortness of breath and hypoxia.  Patient reports that he has had a chronic cough for some time and is always short of breath but since Friday symptoms have been significantly worse.  He reports a severe pain in the right rib area Saturday and Sunday that required him to take 1 pain pill to help him sleep and also a worsening cough with a greenish-yellow sputum.  He reports sometimes he coughs so hard he has posttussive emesis.  He is also been feeling more short of breath.  He has been using his inhalers at home but they have not been helping.  He has had no abdominal pain, fever or diarrhea.  He reports there is no new swelling in his legs and he has been checking his weight daily and has dropped 3 pounds.  He continues to take his home medications as prescribed.  At his doctor's office he was noted to be satting 85% on room air and he was placed on 4 L of oxygen.  He has been vaccinated against Covid and has received both injections.  The history is provided by the patient and medical records.  Shortness of Breath Severity:  Severe Onset quality:  Gradual Duration:  3 days Timing:  Constant Progression:  Worsening Chronicity:  Recurrent Context: URI   Context comment:  Cough Relieved by:  Nothing Worsened by:  Activity and coughing Ineffective treatments:  Rest and inhaler Associated symptoms: chest pain, cough, sputum production, vomiting and wheezing   Associated symptoms: no abdominal pain and no fever        Past Medical  History:  Diagnosis Date  . Asthma 1950's   history of  . Atrial fibrillation ([place])   . Bilateral carpal tunnel syndrome   . Bilateral lower extremity edema   . Bladder tumor   . Chronic systolic heart failure (HCC)    Echo 1/19: Mild LVH, EF 45-50, inf HK, MAC, severe LAE, severe RAE // Echo 7/15: Mild LVH, mod focal basal sept hypertrophy, EF 55-60, AV peak and mean 16/9, trivial MR, mod LAE, PASP 38  . CLL (chronic lymphocytic leukemia) [facility]) oncologist-  dr Ilene Qua--   dx [phone] ;  Lymphocytosis, CLL - per lov note 05-11-2017 currently under active survillance,  CT 04-17-2014 show very small lymphadenopathy, no indication for treatment  . Coronary artery disease    cardiologist-  dr Cathie Olden--  08-18-2017 Intermittant risk nuclear study w/ large area of inferior infartion with no evidence ishcemia   . 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 Diabetes mellitus type 2, noninsulin dependent ([place])   . Elevated PSA    since prostatectomy but now resolved  . Hematuria 04/2017  . History of ear infection    Right  . History of MI (myocardial infarction)    per myoview nuclear study 08-18-2017 , unknown when  . History of shingles 08/2017   L ear and scalp, possible  . Hyperlipidemia   . Hypertension   . Ischemic cardiomyopathy 09/01/2017   Presumed +  CAD with Nuclear stress test 08/18/17 - Inferior scar, no ischemia, intermediate risk // med management unless +angina or worse dyspnea  . OA (osteoarthritis)   . Pacemaker 02/08/2014   followed by dr g. taylor--  single chamber Biotronik due to SSS  . Permanent atrial fibrillation ([place])   . Pneumonia 2019   Left lung  . Prostate cancer [facility]) urologist-  dr Diona Fanti   dx 2004--  Gleason 8, PSA 10.45--  11-28-2002  s/p  radical prostatectomy;  recurrent w/ increasing PSA, started ADT treatment  . RBBB (right bundle branch block)   . Sick sinus syndrome ([place])    a-Flutter with episodes of bradycardia; S/P Biotronik (serial number [id number]) 02-08-2014  .  Urinary incontinence   . Wears hearing aid in right ear    receiver and transmitter    Patient Active Problem List   Diagnosis Date Noted  . Pacemaker 08/15/2019  . Chronic systolic congestive heart failure ([place])   . Acute blood loss anemia   . Periprosthetic fracture around internal prosthetic right hip joint ([place]) 03/20/2019  . Closed right hip fracture, initial encounter ([place]) 03/10/2019  . Chronic bronchitis with productive mucopurulent cough ([place]) 08/22/2018  . Acute systolic heart failure ([place]) 08/02/2018  . Type 2 diabetes mellitus ([place]) 08/02/2018  . Pain in joint of right shoulder 06/08/2018  . CAD (coronary artery disease) 05/21/2018  . Asthma exacerbation 05/21/2018  . Aspiration pneumonia ([place]) 05/21/2018  . Elevated INR   . Shoulder strain, right, initial encounter   . Acute respiratory failure with hypoxia ([place]) 05/20/2018  . Bilateral impacted cerumen 02/14/2018  . Asymmetric SNHL (sensorineural hearing loss) 02/14/2018  . Encounter for therapeutic drug monitoring 12/20/2017  . S/P thoracotomy 11/29/2017  . Sepsis ([place])   . Hypoxemia   . Pneumonia of left lower lobe due to infectious organism   . Mediastinal lymphadenopathy   . Chronic cough 11/23/2017  . CLL (chronic lymphocytic leukemia) ([place]) 11/23/2017  . Pleural effusion 11/23/2017  . HCAP (healthcare-associated pneumonia) 11/20/2017  . Anemia 11/20/2017  . Mixed conductive and sensorineural hearing loss of right ear with restricted hearing of left ear 09/14/2017  . Abnormal hearing test 09/06/2017  . Cellulitis 09/06/2017  . Lymphadenitis, acute 09/06/2017  . Ischemic cardiomyopathy 09/01/2017  . S/p bilateral revision of total hip arthroplasty 08/11/2017  . History of revision of total replacement of left hip joint 08/11/2017  . Right hip pain   . Acute otitis media   . Chronic systolic CHF (congestive heart failure) ([place])   . Pressure injury of skin 07/20/2017  . Pressure ulcer 07/20/2017  .  Malignant otitis externa 07/19/2017  . Fall in home 07/19/2017  . Acute on chronic diastolic heart failure ([place]) 07/19/2017  . S/P left THA, AA 05/12/2016  . History of repair of hip joint 05/12/2016  . Laryngopharyngeal reflux 05/21/2015  . Allergic rhinitis 05/21/2015  . Obesity 12/12/2014  . Upper airway cough syndrome 12/11/2014  . Posterior rhinorrhea 12/11/2014  . Cough variant asthma 07/27/2014  . Wheezing 06/20/2014  . Symptomatic bradycardia - s/p Biotronik (serial number [id number]) 02/08/2014  . Bradycardia 02/08/2014  . Diabetes mellitus type 2, noninsulin dependent ([place])   . Mixed incontinence 03/09/2013  . Edema of foot 03/06/2013  . Traumatic ecchymosis of right foot 03/06/2013  . Contusion of foot 03/06/2013  . Malignant neoplasm of prostate ([place]) 02/15/2013  . Diabetes mellitus without complication ([place]) 09/62/8366  . Bone spur 12/30/2012  . Pain of toe of left foot 12/30/2012  .  Hyperlipidemia 07/13/2012  . HTN (hypertension) 04/12/2012  . Sick sinus syndrome ([place]) 08/17/2011  . PAF (paroxysmal atrial fibrillation) ([place]) 09/16/2010    Past Surgical History:  Procedure Laterality Date  . APPENDECTOMY    . BACK SURGERY     disk  . CARDIAC CATHETERIZATION  09-03-1999  dr Cathie Olden   abnormal cardiolite study:  minor luminal irregularities but no critial coronary artery stenosis  . CARDIOVASCULAR STRESS TEST  08-18-2017  dr Cathie Olden   Intermediate risk nuclear study w/ large area inferior infarction, no evidence of ishcemia (consistant w/ prior MI)/  study not gated due to frequent PVCs  . CARPAL TUNNEL RELEASE Right 2000  . CARPAL TUNNEL RELEASE Left 11/19/2009  . CATARACT EXTRACTION Right 07/2015  . CATARACT EXTRACTION Left 09/2015  . CYSTOSCOPY N/A 11/04/2017   Procedure: CYSTOSCOPY AND CAUTERIZATION OF BLADDER;  Surgeon: Franchot Gallo, MD;  Location: [facility];  Service: Urology;  Laterality: N/A;  . INSERTION PENILE PROSTHESIS  02-22-2004     dr Mattie Marlin  [facility]  . IR THORACENTESIS ASP PLEURAL SPACE W/IMG GUIDE  11/26/2017  . KNEE ARTHROSCOPY Left 07/2010  . ORIF PERIPROSTHETIC FRACTURE Right 03/15/2019   Procedure: OPEN REDUCTION INTERNAL FIXATION (ORIF) PERIPROSTHETIC FRACTURE WITH ZIMMER CABLES;  Surgeon: Paralee Cancel, MD;  Location: [place];  Service: Orthopedics;  Laterality: Right;  . PERMANENT PACEMAKER INSERTION N/A 02/08/2014   Procedure: PERMANENT PACEMAKER INSERTION;  Surgeon: Evans Lance, MD;  Location[facility] CATH LAB;  Service: Cardiovascular;  Laterality: N/A;  . PLEURAL EFFUSION DRAINAGE Left 11/30/2017   Procedure: DRAINAGE OF HEMOTHORAX;  Surgeon: Ivin Poot, MD;  Location: [place];  Service: Thoracic;  Laterality: Left;  . RADICAL RETROPUBIC PROSTATECTOMY W/ BILATERAL PELVIC LYMPH NODE DISSECTION  11-28-2002   dr Mattie Marlin  [facility]  . TONSILLECTOMY    . TOTAL HIP ARTHROPLASTY Left 05/12/2016   Procedure: LEFT TOTAL HIP ARTHROPLASTY ANTERIOR APPROACH;  Surgeon: Paralee Cancel, MD;  Location: [facility];  Service: Orthopedics;  Laterality: Left;  . TOTAL HIP ARTHROPLASTY Right 07-15-2006   dr Alvan Dame  [facility]  . TRANSTHORACIC ECHOCARDIOGRAM  07/20/2017   mild LVH, ef 45-50%, hypokinesis of the basal-midinferior myocardium, due to AFib unable to evaluate diastolic function/  severe LAE and RAE/  trivial PR and TR  . VIDEO ASSISTED THORACOSCOPY Left 11/30/2017   Procedure: VIDEO ASSISTED THORACOSCOPY;  Surgeon: Ivin Poot, MD;  Location: [place];  Service: Thoracic;  Laterality: Left;  [name] VIDEO ASSISTED THORACOSCOPY (VATS)/EMPYEMA Left 11/29/2017   Procedure: VIDEO ASSISTED THORACOSCOPY (VATS)/EMPYEMA;  Surgeon: Ivin Poot, MD;  Location: [facility] OR;  Service: Thoracic;  Laterality: Left;       Family History  Problem Relation Age of Onset  . Emphysema Mother   . Allergic rhinitis Mother   . Heart attack Father   . Allergic rhinitis Brother   . Pulmonary fibrosis Brother     Social History   Tobacco Use  . Smoking status:  Former Smoker    Years: 25.00    Types: Pipe, Cigars    Quit date: 11/25/1975    Years since quitting: 43.9  . Smokeless tobacco: Never Used  Substance Use Topics  . Alcohol use: Yes    Alcohol/week: 0.0 standard drinks    Comment: occasional  . Drug use: No    Home Medications Prior to Admission medications   Medication Sig Start Date End Date Taking? Authorizing Provider  acetaminophen (TYLENOL) 325 MG tablet Take 2 tablets (650 mg  total) by mouth every 4 (four) hours as needed for mild pain. 04/03/19   Love, Ivan Anchors, PA-C  alendronate (FOSAMAX) 70 MG tablet Take 70 mg by mouth once a week. 07/12/19   [provider]  atorvastatin (LIPITOR) 20 MG tablet TAKE ONE TABLET DAILY AT 6PM 09/06/19   Nahser, Wonda Cheng, MD  carvedilol (COREG) 3.125 MG tablet TAKE ONE TABLET TWICE DAILY 09/07/18   Evans Lance, MD  ciprofloxacin (CILOXAN) 0.3 % ophthalmic solution 4 drops 2 (two) times daily. 07/24/19   [provider]  fenofibrate micronized (LOFIBRA) 200 MG capsule TAKE ONE CAPSULE EACH DAY BEFORE BREAKFAST 07/12/19   Nahser, Wonda Cheng, MD  furosemide (LASIX) 20 MG tablet Take 1 tablet (20 mg total) by mouth daily. 04/11/19   Love, Ivan Anchors, PA-C  gabapentin (NEURONTIN) 300 MG capsule TAKE ONE CAPSULE TWICE A DAY 06/19/19   Olalere, Adewale A, MD  glipiZIDE (GLUCOTROL XL) 2.5 MG 24 hr tablet Take 2.5 mg by mouth. Take 1 tablet daily if blood glucose >140    [provider]  guaiFENesin (MUCINEX) 600 MG 12 hr tablet Take 600 mg by mouth 2 (two) times daily as needed for cough.     [provider]  HYDROcodone-homatropine (HYCODAN) 5-1.5 MG/5ML syrup Take 5 mLs by mouth 2 times daily at 12 noon and 4 pm. As needed    [provider]  hydrocortisone 2.5 % cream Apply topically 2 (two) times daily. to affected area 02/01/19   [provider]  ipratropium (ATROVENT) 0.02 % nebulizer solution TAKE 2.29m BY NEBULIZER 3 TIMES DAILY 03/01/19   Olalere,  Adewale A, MD  ketoconazole (NIZORAL) 2 % shampoo 5 APPLICATIONS TO SCALP 2 TO 3 TIMES A WEEK 01/09/19   [provider]  metFORMIN (GLUCOPHAGE) 1000 MG tablet Take 1,000 mg by mouth every morning.    [provider]  metFORMIN (GLUCOPHAGE) 850 MG tablet Take 850 mg by mouth at bedtime.    [provider]  methocarbamol (ROBAXIN) 500 MG tablet Robaxin 500 mg tablet   500 mg by oral route. 03/16/19   [provider]  mometasone (ELOCON) 0.1 % cream APPLY TO EXTERNAL EAR ONCE A DAY FOR 7 DAYS THEN AS NEEDED FOR ITCHING 07/24/19   [provider]  Multiple Vitamin (MULTIVITAMIN WITH MINERALS) TABS tablet Take 1 tablet by mouth daily.    [provider]  [facility] test strip 1 each by Other route daily. 06/12/19   [provider]  prednisoLONE acetate (PRED FORTE) 1 % ophthalmic suspension 4 drops 2 (two) times daily. 07/24/19   [provider]  traMADol (ULTRAM) 50 MG tablet Take 1 tablet (50 mg total) by mouth every 12 (twelve) hours as needed for severe pain. 04/11/19   Love, PIvan Anchors PA-C  vitamin B-12 (CYANOCOBALAMIN) 1000 MCG tablet Take 1,000 mcg by mouth daily.     [provider]  warfarin (COUMADIN) 5 MG tablet TAKE AS DIRECTED BY COUMADIN CLINIC 10/12/19   Nahser, PWonda Cheng MD    Allergies    Altace [ramipril], Augmentin [amoxicillin-pot clavulanate], Codeine, and Simvastatin  Review of Systems   Review of Systems  Constitutional: Negative for fever.  Respiratory: Positive for cough, sputum production, shortness of breath and wheezing.   Cardiovascular: Positive for chest pain.  Gastrointestinal: Positive for vomiting. Negative for abdominal pain.  All other systems reviewed and are negative.   Physical Exam Updated Vital Signs BP (!) 138/56 (BP Location: Left Arm)  Pulse 66   Resp (!) 23   Ht _0  (1.778 m)   Wt 96 kg   SpO2 (!) 88%   BMI 30.37 kg/m   Physical Exam Vitals and nursing note  reviewed.  Constitutional:      General: He is not in acute distress.    Appearance: He is well-developed and normal weight.  HENT:     Head: Normocephalic and atraumatic.  Eyes:     Conjunctiva/sclera: Conjunctivae normal.     Pupils: Pupils are equal, round, and reactive to light.  Cardiovascular:     Rate and Rhythm: Normal rate and regular rhythm.     Heart sounds: No murmur.  Pulmonary:     Effort: Pulmonary effort is normal. Tachypnea present. No respiratory distress.     Breath sounds: Examination of the right-middle field reveals decreased breath sounds. Examination of the right-lower field reveals decreased breath sounds. Decreased breath sounds and rhonchi present. No wheezing or rales.     Comments: Rhonchi and coarse breath sounds present throughout all lung fields Chest:     Chest wall: No tenderness.  Abdominal:     General: There is no distension.     Palpations: Abdomen is soft.     Tenderness: There is no abdominal tenderness. There is no guarding or rebound.  Musculoskeletal:        General: No tenderness. Normal range of motion.     Cervical back: Normal range of motion and neck supple.     Right lower leg: Edema present.     Left lower leg: Edema present.     Comments: 1+ edema in bilateral ankles  Skin:    General: Skin is warm and dry.     Capillary Refill: Capillary refill takes less than 2 seconds.     Findings: No erythema or rash.  Neurological:     General: No focal deficit present.     Mental Status: He is alert and oriented to person, place, and time.  Psychiatric:        Behavior: Behavior normal.     ED Results / Procedures / Treatments   Labs (all labs ordered are listed, but only abnormal results are displayed) Labs Reviewed  LACTIC ACID, PLASMA - Abnormal; Notable for the following components:      Result Value   Lactic Acid, Venous 2.9 (*)    All other components within normal limits  COMPREHENSIVE METABOLIC PANEL - Abnormal; Notable  for the following components:   Sodium 134 (*)    Potassium 5.2 (*)    CO2 21 (*)    Glucose, Bld 297 (*)    BUN 36 (*)    Creatinine, Ser 1.33 (*)    Calcium 8.8 (*)    Albumin 2.9 (*)    Total Bilirubin 1.3 (*)    GFR calc non Af Amer 48 (*)    GFR calc Af Amer 56 (*)    All other components within normal limits  CBC WITH DIFFERENTIAL/PLATELET - Abnormal; Notable for the following components:   WBC 56.6 (*)    RBC 3.76 (*)    Hemoglobin 12.5 (*)    HCT 37.9 (*)    MCV 100.8 (*)    RDW 18.4 (*)    Neutro Abs 21.5 (*)    Lymphs Abs 30.9 (*)    Monocytes Absolute 3.3 (*)    Basophils Absolute 0.2 (*)    Abs Immature Granulocytes 0.78 (*)    All other components within  normal limits  APTT - Abnormal; Notable for the following components:   aPTT 68 (*)    All other components within normal limits  PROTIME-INR - Abnormal; Notable for the following components:   Prothrombin Time 62.2 (*)    INR 7.2 (*)    All other components within normal limits  POCT I-STAT EG7 - Abnormal; Notable for the following components:   pO2, Ven 144.0 (*)    Acid-base deficit 3.0 (*)    Sodium 134 (*)    Potassium 5.3 (*)    Calcium, Ion 1.14 (*)    All other components within normal limits  CULTURE, BLOOD (ROUTINE X 2)  CULTURE, BLOOD (ROUTINE X 2)  RESPIRATORY PANEL BY RT PCR (FLU A&B, COVID)  LACTIC ACID, PLASMA  BRAIN NATRIURETIC PEPTIDE  I-STAT VENOUS BLOOD GAS, ED    EKG EKG Interpretation  Date/Time:  Monday October 16 2019 12:42:16 EDT Ventricular Rate:  67 PR Interval:    QRS Duration: 172 QT Interval:  463 QTC Calculation: 489 R Axis:   -93 Text Interpretation: Atrial fibrillation Ventricular premature complex Right bundle branch block No significant change since last tracing Confirmed by Blanchie Dessert ([id number]) on 09/27/2019 12:56:28 PM   Radiology DG Chest Port 1 View  Result Date: 10/10/2019 CLINICAL DATA:  Short of breath, cough, hypoxia EXAM: PORTABLE CHEST 1 VIEW  COMPARISON:  04/09/2019 FINDINGS: Single frontal view of the chest demonstrates stable single lead pacemaker. Cardiac silhouette is unchanged. There is new veiling opacity at the right lung base consistent with consolidation and effusion. No pneumothorax. IMPRESSION: 1. Right basilar consolidation and effusion, compatible with pneumonia. Electronically Signed   By: Randa Ngo M.D.   On: 10/03/2019 13:17    Procedures Procedures (including critical care time)  Medications Ordered in ED Medications - No data to display  ED Course  I have reviewed the triage vital signs and the nursing notes.  Pertinent labs & imaging results that were available during my care of the patient were reviewed by me and considered in my medical decision making (see chart for details).    MDM Rules/Calculators/A&P                      Elderly male presenting today with pain, shortness of breath, hypoxia and new productive cough with green sputum.  Patient does intermittently have issues with swallowing and aspiration and concern for new aspiration pneumonia versus empyema.  Patient has been vaccinated against Covid with lower suspicion that this is Covid.  Also patient has a history of CHF but does not have significant new swelling and reports a decrease in weight which would make CHF lower likelihood.  Also lower suspicion for PE at this time.  Patient is currently on 5 L of oxygen to maintain oxygen saturation of 90%.  He is not significantly wheezing where inhalers would be helpful at this time.  Labs and x-ray are pending.  Patient's EKG shows a right bundle branch block but no acute changes.  1:35 PM Patient's EKG is consistent with consolidation and effusion compatible with pneumonia on the right lower lobe.  On repeat evaluation patient is still feeling short of breath and satting 88 to 89% on 5 L.  Will have RT come in place patient on high flow nasal cannula to help with oxygenation.  He was also started on  Rocephin and azithromycin.  Once lab values to return we will plan on admission.  2:23 PM VBG without acute findings,  CMP with new AKI with creatinine 1.3 from baseline of 8 and mild hyperkalemia 5.2.  Patient's white count today with a leukocytosis of 56,000 with his baseline usually in the 20s to 30s and stable hemoglobin in the setting of CLL.  Lactate today of 2.9 and INR supratherapeutic at 7.2.  Patient given 500 bolus of fluid will reverse INR with vitamin K.  Patient placed on 9 L high flow nasal cannula with significant improvement in his shortness of breath and work of breathing.  MDM Number of Diagnoses or Management Options   Amount and/or Complexity of Data Reviewed Clinical lab tests: ordered and reviewed Tests in the radiology section of CPT: ordered and reviewed Tests in the medicine section of CPT: ordered and reviewed Decide to obtain previous medical records or to obtain history from someone other than the patient: yes Obtain history from someone other than the patient: yes Review and summarize past medical records: yes Discuss the patient with other providers: yes Independent visualization of images, tracings, or specimens: yes  Risk of Complications, Morbidity, and/or Mortality Presenting problems: high Diagnostic procedures: low Management options: moderate  Critical Care Total time providing critical care: 30-74 minutes  Patient Progress Patient progress: improved  CRITICAL CARE Performed by: Emillia Weatherly Total critical care time: 40 minutes Critical care time was exclusive of separately billable procedures and treating other patients. Critical care was necessary to treat or prevent imminent or life-threatening deterioration. Critical care was time spent personally by me on the following activities: development of treatment plan with patient and/or surrogate as well as nursing, discussions with consultants, evaluation of patient's response to treatment,  examination of patient, obtaining history from patient or surrogate, ordering and performing treatments and interventions, ordering and review of laboratory studies, ordering and review of radiographic studies, pulse oximetry and re-evaluation of patient's condition.   Final Clinical Impression(s) / ED Diagnoses Final diagnoses:  Community acquired pneumonia of right lower lobe of lung  AKI (acute kidney injury) ([place])  Supratherapeutic INR  Acute respiratory failure with hypoxia [facility])    Rx / DC Orders ED Discharge Orders    None       Blanchie Dessert, MD 10/05/2019 1426

## 2019-10-16 NOTE — ED Notes (Signed)
Patient transported to CT 

## 2019-10-16 NOTE — Consult Note (Signed)
Referral MD  Reason for Referral: CLL with right lower lobe pneumonia/pleural effusion; multiple other health issues.  Biochemically recurrent prostate cancer.  Chief Complaint  Patient presents with  . Shortness of Breath  : I had a hard time breathing.  HPI: [name] is a nice 86 year old white male.  He has multiple, multiple health problems.  He is followed by Dr. Alen Blew for CLL.  He is seen every 6 months.  Again, he has multiple other health problems.  Is on Coumadin for atrial fibrillation.  He has diabetes.  He has a history of biochemical recurrent prostate cancer.  He now is in the emergency room at [facility].  He came in with shortness of breath.  He has pneumonia and a right pleural effusion and possible empyema.  His labs today show white cell count of 56.6.  Hemoglobin 12.5 and platelet count 391,000.  His white cell differential with 38 segs 55 lymphs 6 monos.  When he last saw Dr. Alen Blew 6 months ago, I think his white cell count was 26,000.  His white cell differential was 10 segs and 78 lymphs and 11 monos.  The emergency room doctor was very worried about the elevated white cell count.  He asked for a consult thinking that the patient was possibly transforming to acute leukemia.  [name] is very nice.  He is hard of hearing.  His wife is with him.  His does not sound like he has had many issues with the CLL.  From what his wife says he has had pneumonia in the past.  There is been no swollen lymph nodes.  They have had a CT scan today.  This was of the chest.  This showed dense masslike consolidation in the right lower lobe with marked compression and occlusion of the bronchus intermedius.  The findings were very suspicious for neoplasm.  There is a large right pleural effusion.  He has enlarged mediastinal and hilar lymph nodes.  He has a cough.  He is bringing up quite a bit of purulent mucus which is greenish-brown in color.  He has had no obvious  hemoptysis.  I think there is a past history of tobacco use.  He stopped smoking about 43 years ago.  He has about 25-pack-year history of tobacco use.  He is on Coumadin.  This has to be reversed.  I am sure that his cardiologist will put him on one of the newer oral anticoagulants.  He has been admitted to stepdown unit.  Currently, his performance status is ECOG 3.     Past Medical History:  Diagnosis Date  . Asthma 1950's   history of  . Atrial fibrillation ([place])   . Bilateral carpal tunnel syndrome   . Bilateral lower extremity edema   . Bladder tumor   . Chronic systolic heart failure (HCC)    Echo 1/19: Mild LVH, EF 45-50, inf HK, MAC, severe LAE, severe RAE // Echo 7/15: Mild LVH, mod focal basal sept hypertrophy, EF 55-60, AV peak and mean 16/9, trivial MR, mod LAE, PASP 38  . CLL (chronic lymphocytic leukemia) [facility]) oncologist-  dr Ilene Qua--   dx [phone] ;  Lymphocytosis, CLL - per lov note 05-11-2017 currently under active survillance,  CT 04-17-2014 show very small lymphadenopathy, no indication for treatment  . Coronary artery disease    cardiologist-  dr Cathie Olden--  08-18-2017 Intermittant risk nuclear study w/ large area of inferior infartion with no evidence ishcemia   . Deafness in  right ear   . Diabetes mellitus type 2, noninsulin dependent ([place])   . Elevated PSA    since prostatectomy but now resolved  . Hematuria 04/2017  . History of ear infection    Right  . History of MI (myocardial infarction)    per myoview nuclear study 08-18-2017 , unknown when  . History of shingles 08/2017   L ear and scalp, possible  . Hyperlipidemia   . Hypertension   . Ischemic cardiomyopathy 09/01/2017   Presumed +CAD with Nuclear stress test 08/18/17 - Inferior scar, no ischemia, intermediate risk // med management unless +angina or worse dyspnea  . OA (osteoarthritis)   . Pacemaker 02/08/2014   followed by dr g. taylor--  single chamber Biotronik due to SSS  . Permanent atrial  fibrillation ([place])   . Pneumonia 2019   Left lung  . Prostate cancer [facility]) urologist-  dr Diona Fanti   dx 2004--  Gleason 8, PSA 10.45--  11-28-2002  s/p  radical prostatectomy;  recurrent w/ increasing PSA, started ADT treatment  . RBBB (right bundle branch block)   . Sick sinus syndrome ([place])    a-Flutter with episodes of bradycardia; S/P Biotronik (serial number [id number]) 02-08-2014  . Urinary incontinence   . Wears hearing aid in right ear    receiver and transmitter  :  Past Surgical History:  Procedure Laterality Date  . APPENDECTOMY    . BACK SURGERY     disk  . CARDIAC CATHETERIZATION  09-03-1999  dr Cathie Olden   abnormal cardiolite study:  minor luminal irregularities but no critial coronary artery stenosis  . CARDIOVASCULAR STRESS TEST  08-18-2017  dr Cathie Olden   Intermediate risk nuclear study w/ large area inferior infarction, no evidence of ishcemia (consistant w/ prior MI)/  study not gated due to frequent PVCs  . CARPAL TUNNEL RELEASE Right 2000  . CARPAL TUNNEL RELEASE Left 11/19/2009  . CATARACT EXTRACTION Right 07/2015  . CATARACT EXTRACTION Left 09/2015  . CYSTOSCOPY N/A 11/04/2017   Procedure: CYSTOSCOPY AND CAUTERIZATION OF BLADDER;  Surgeon: Franchot Gallo, MD;  Location: [facility];  Service: Urology;  Laterality: N/A;  . INSERTION PENILE PROSTHESIS  02-22-2004    dr Mattie Marlin  [facility]  . IR THORACENTESIS ASP PLEURAL SPACE W/IMG GUIDE  11/26/2017  . KNEE ARTHROSCOPY Left 07/2010  . ORIF PERIPROSTHETIC FRACTURE Right 03/15/2019   Procedure: OPEN REDUCTION INTERNAL FIXATION (ORIF) PERIPROSTHETIC FRACTURE WITH ZIMMER CABLES;  Surgeon: Paralee Cancel, MD;  Location: [place];  Service: Orthopedics;  Laterality: Right;  . PERMANENT PACEMAKER INSERTION N/A 02/08/2014   Procedure: PERMANENT PACEMAKER INSERTION;  Surgeon: Evans Lance, MD;  Location: [facility] CATH LAB;  Service: Cardiovascular;  Laterality: N/A;  . PLEURAL EFFUSION DRAINAGE Left 11/30/2017   Procedure:  DRAINAGE OF HEMOTHORAX;  Surgeon: Ivin Poot, MD;  Location: [place];  Service: Thoracic;  Laterality: Left;  . RADICAL RETROPUBIC PROSTATECTOMY W/ BILATERAL PELVIC LYMPH NODE DISSECTION  11-28-2002   dr Mattie Marlin  [facility]  . TONSILLECTOMY    . TOTAL HIP ARTHROPLASTY Left 05/12/2016   Procedure: LEFT TOTAL HIP ARTHROPLASTY ANTERIOR APPROACH;  Surgeon: Paralee Cancel, MD;  Location: [facility];  Service: Orthopedics;  Laterality: Left;  . TOTAL HIP ARTHROPLASTY Right 07-15-2006   dr Alvan Dame  [facility]  . TRANSTHORACIC ECHOCARDIOGRAM  07/20/2017   mild LVH, ef 45-50%, hypokinesis of the basal-midinferior myocardium, due to AFib unable to evaluate diastolic function/  severe LAE and RAE/  trivial PR and TR  .  VIDEO ASSISTED THORACOSCOPY Left 11/30/2017   Procedure: VIDEO ASSISTED THORACOSCOPY;  Surgeon: Ivin Poot, MD;  Location: [place];  Service: Thoracic;  Laterality: Left;  [name] VIDEO ASSISTED THORACOSCOPY (VATS)/EMPYEMA Left 11/29/2017   Procedure: VIDEO ASSISTED THORACOSCOPY (VATS)/EMPYEMA;  Surgeon: Prescott Gum, Collier Salina, MD;  Location: [place];  Service: Thoracic;  Laterality: Left;  :   Current Facility-Administered Medications:  .  acetaminophen (TYLENOL) tablet 650 mg, 650 mg, Oral, Q6H PRN **OR** acetaminophen (TYLENOL) suppository 650 mg, 650 mg, Rectal, Q6H PRN, Wynetta Fines T, MD .  alendronate (FOSAMAX) tablet 70 mg, 70 mg, Oral, Weekly, Wynetta Fines T, MD .  atorvastatin (LIPITOR) tablet 20 mg, 20 mg, Oral, Daily, Zhang, Ping T, MD .  fenofibrate tablet 160 mg, 160 mg, Oral, Daily, Wynetta Fines T, MD .  gabapentin (NEURONTIN) capsule 100 mg, 100 mg, Oral, BID, Wynetta Fines T, MD .  guaiFENesin (MUCINEX) 12 hr tablet 600 mg, 600 mg, Oral, BID PRN, Wynetta Fines T, MD .  hydrALAZINE (APRESOLINE) tablet 25 mg, 25 mg, Oral, Q6H PRN, Wynetta Fines T, MD .  HYDROcodone-acetaminophen (NORCO/VICODIN) 5-325 MG per tablet 1-2 tablet, 1-2 tablet, Oral, Q4H PRN, Wynetta Fines T, MD .  hydrocortisone 2.5 % cream, ,  Topical, BID, Zhang, Ping T, MD .  insulin aspart (novoLOG) injection 0-9 Units, 0-9 Units, Subcutaneous, TID WC, Zhang, Ping T, MD .  ipratropium (ATROVENT) nebulizer solution 0.5 mg, 0.5 mg, Nebulization, Q4H, Wynetta Fines T, MD .  lactobacillus acidophilus (BACID) tablet 2 tablet, 2 tablet, Oral, TID, Lequita Halt, MD .  Derrill Memo ON 10/17/2019] multivitamin with minerals tablet 1 tablet, 1 tablet, Oral, Daily, Zhang, Ping T, MD .  ondansetron (ZOFRAN) tablet 4 mg, 4 mg, Oral, Q6H PRN **OR** ondansetron (ZOFRAN) injection 4 mg, 4 mg, Intravenous, Q6H PRN, Wynetta Fines T, MD .  phytonadione (VITAMIN K) 5 mg in dextrose 5 % 50 mL IVPB, 5 mg, Intravenous, Once, Clark, Laura P, DO .  piperacillin-tazobactam (ZOSYN) IVPB 3.375 g, 3.375 g, Intravenous, Once, Wynetta Fines T, MD .  Derrill Memo ON 10/17/2019] piperacillin-tazobactam (ZOSYN) IVPB 3.375 g, 3.375 g, Intravenous, Q8H, Mancheril, Darnell Level, RPH .  prednisoLONE acetate (PRED FORTE) 1 % ophthalmic suspension 4 drop, 4 drop, Both Eyes, BID, Lequita Halt, MD .  Derrill Memo ON 10/17/2019] vitamin B-12 (CYANOCOBALAMIN) tablet 1,000 mcg, 1,000 mcg, Oral, Daily, Lequita Halt, MD .  Derrill Memo ON 10/17/2019] Warfarin - Pharmacist Dosing Inpatient, , Does not apply, q1600, Mancheril, Darnell Level, [facility]  Current Outpatient Medications:  .  acetaminophen (TYLENOL) 325 MG tablet, Take 2 tablets (650 mg total) by mouth every 4 (four) hours as needed for mild pain., Disp:  , Rfl:  .  alendronate (FOSAMAX) 70 MG tablet, Take 70 mg by mouth once a week., Disp: , Rfl:  .  atorvastatin (LIPITOR) 20 MG tablet, TAKE ONE TABLET DAILY AT 6PM (Patient taking differently: Take 20 mg by mouth daily. ), Disp: 90 tablet, Rfl: 0 .  carvedilol (COREG) 3.125 MG tablet, TAKE ONE TABLET TWICE DAILY (Patient taking differently: Take 3.125 mg by mouth in the morning and at bedtime. ), Disp: 180 tablet, Rfl: 3 .  ciprofloxacin (CILOXAN) 0.3 % ophthalmic solution, Place 4 drops into both eyes 2 (two)  times daily. , Disp: , Rfl:  .  fenofibrate micronized (LOFIBRA) 200 MG capsule, TAKE ONE CAPSULE EACH DAY BEFORE BREAKFAST (Patient taking differently: Take 200 mg by mouth daily. ), Disp: 90 capsule, Rfl: 3 .  furosemide (LASIX) 20  MG tablet, Take 1 tablet (20 mg total) by mouth daily., Disp: , Rfl:  .  glipiZIDE (GLUCOTROL XL) 2.5 MG 24 hr tablet, Take 2.5 mg by mouth. Take 1 tablet daily if blood glucose >140, Disp: , Rfl:  .  guaiFENesin (MUCINEX) 600 MG 12 hr tablet, Take 600 mg by mouth 2 (two) times daily as needed for cough. , Disp: , Rfl:  .  HYDROcodone-homatropine (HYCODAN) 5-1.5 MG/5ML syrup, Take 5 mLs by mouth 2 times daily at 12 noon and 4 pm. As needed, Disp: , Rfl:  .  hydrocortisone 2.5 % cream, Apply 1 application topically 2 (two) times daily as needed (for itching). to affected area, Disp: , Rfl:  .  ipratropium (ATROVENT) 0.02 % nebulizer solution, TAKE 2.41m BY NEBULIZER 3 TIMES DAILY (Patient taking differently: Take 0.25 mg by nebulization See admin instructions. Takes 2 to 3 times daily as needed for wheezing or shortness of breath.), Disp: 675 mL, Rfl: 0 .  ketoconazole (NIZORAL) 2 % shampoo, Apply 1 application topically See admin instructions. 2 to 3 times a week., Disp: , Rfl:  .  metFORMIN (GLUCOPHAGE) 1000 MG tablet, Take 1,000 mg by mouth every morning., Disp: , Rfl:  .  metFORMIN (GLUCOPHAGE) 850 MG tablet, Take 850 mg by mouth at bedtime., Disp: , Rfl:  .  mometasone (ELOCON) 0.1 % cream, Apply 1 application topically daily as needed (for itching). , Disp: , Rfl:  .  Multiple Vitamin (MULTIVITAMIN WITH MINERALS) TABS tablet, Take 1 tablet by mouth daily., Disp: , Rfl:  .  traMADol (ULTRAM) 50 MG tablet, Take 1 tablet (50 mg total) by mouth every 12 (twelve) hours as needed for severe pain., Disp: 14 tablet, Rfl: 0 .  vitamin B-12 (CYANOCOBALAMIN) 1000 MCG tablet, Take 1,000 mcg by mouth daily. , Disp: , Rfl:  .  warfarin (COUMADIN) 5 MG tablet, TAKE AS DIRECTED BY  COUMADIN CLINIC (Patient taking differently: Take 5 mg by mouth daily. ), Disp: 100 tablet, Rfl: 1 .  gabapentin (NEURONTIN) 300 MG capsule, TAKE ONE CAPSULE TWICE A DAY (Patient not taking: Reported on 09/21/2019), Disp: 60 capsule, Rfl: 3 .  ONETOUCH ULTRA test strip, 1 each by Other route daily., Disp: , Rfl: :  . alendronate  70 mg Oral Weekly  . atorvastatin  20 mg Oral Daily  . fenofibrate  160 mg Oral Daily  . gabapentin  100 mg Oral BID  . hydrocortisone   Topical BID  . insulin aspart  0-9 Units Subcutaneous TID WC  . ipratropium  0.5 mg Nebulization Q4H  . lactobacillus acidophilus  2 tablet Oral TID  . [START ON 10/17/2019] multivitamin with minerals  1 tablet Oral Daily  . prednisoLONE acetate  4 drop Both Eyes BID  . [START ON 10/17/2019] vitamin B-12  1,000 mcg Oral Daily  . [START ON 10/17/2019] Warfarin - Pharmacist Dosing Inpatient   Does not apply [id number] :  Allergies  Allergen Reactions  . Altace [Ramipril] Other (See Comments)    "throat felt like had a knot in it"  . Augmentin [Amoxicillin-Pot Clavulanate] Diarrhea    severe  . Codeine Nausea And Vomiting    Nausea and vomiting   . Simvastatin Other (See Comments)    Leg aches  :  Family History  Problem Relation Age of Onset  . Emphysema Mother   . Allergic rhinitis Mother   . Heart attack Father   . Allergic rhinitis Brother   . Pulmonary fibrosis Brother   :  Social History   Socioeconomic History  . Marital status: Married    Spouse name: Not on file  . Number of children: Not on file  . Years of education: Not on file  . Highest education level: Not on file  Occupational History  . Not on file  Tobacco Use  . Smoking status: Former Smoker    Years: 25.00    Types: Pipe, Cigars    Quit date: 11/25/1975    Years since quitting: 43.9  . Smokeless tobacco: Never Used  Substance and Sexual Activity  . Alcohol use: Yes    Alcohol/week: 0.0 standard drinks    Comment: occasional  . Drug use: No   . Sexual activity: Not on file  Other Topics Concern  . Not on file  Social History Narrative  . Not on file   Social Determinants of Health   Financial Resource Strain:   . Difficulty of Paying Living Expenses:   Food Insecurity:   . Worried About Charity fundraiser in the Last Year:   . Arboriculturist in the Last Year:   Transportation Needs:   . Film/video editor (Medical):   [name] Lack of Transportation (Non-Medical):   Physical Activity:   . Days of Exercise per Week:   . Minutes of Exercise per Session:   Stress:   . Feeling of Stress :   Social Connections:   . Frequency of Communication with Friends and Family:   . Frequency of Social Gatherings with Friends and Family:   . Attends Religious Services:   . Active Member of Clubs or Organizations:   . Attends Archivist Meetings:   [name] Marital Status:   Intimate Partner Violence:   . Fear of Current or Ex-Partner:   . Emotionally Abused:   [name] Physically Abused:   . Sexually Abused:   :  Review of Systems  Constitutional: Positive for chills and malaise/fatigue.  HENT: Positive for hearing loss.   Eyes: Negative.   Respiratory: Positive for cough, sputum production and shortness of breath.   Cardiovascular: Positive for chest pain, palpitations and leg swelling.  Gastrointestinal: Positive for nausea.  Genitourinary: Positive for frequency.  Musculoskeletal: Positive for back pain and myalgias.  Skin: Negative.   Neurological: Positive for weakness.  Endo/Heme/Allergies: Negative.   Psychiatric/Behavioral: Negative.     Exam:  This is an elderly appearing ill white male in no obvious distress.  Vital signs temperature of 99.  Pulse 62.  Blood pressure 128/50.  Oxygen saturation 93%.  Head neck exam shows no adenopathy in the neck.  Thyroid is nonpalpable.  There is no scleral icterus.  Pupils react appropriately.  Lungs show poor breath sounds in the right lung field.  He has scattered wheezes and  rhonchi bilaterally.  Cardiac exam is irregular rate and irregular rhythm consistent with atrial fibrillation.  He has a 1/6 systolic ejection murmur.  Abdomen is soft.  There is no guarding rebound tenderness.  He has no obvious hepatosplenomegaly.  There is no fluid wave.  Extremities shows 2+ edema in his legs.  Skin exam shows scattered ecchymoses.  Neurological exam is nonfocal.   Patient Vitals for the past 24 hrs:  BP Temp Temp src Pulse Resp SpO2 Height Weight  09/29/2019 1700 (!) 128/50 -- -- 62 (!) 34 93 % -- --  10/08/2019 1630 (!) 136/54 -- -- 63 (!) 24 91 % -- --  10/13/2019 1600 136/60 -- -- 66 (!) 27 91 % -- --  09/26/2019 1530 (!) 142/59 -- -- 64 20 91 % -- --  10/09/2019 1523 (!) 136/59 -- -- 69 (!) 28 93 % -- --  09/29/2019 1430 (!) 132/49 -- -- 63 (!) 28 93 % -- --  10/13/2019 1400 (!) 124/53 -- -- 66 (!) 25 91 % -- --  09/27/2019 1352 (!) 138/55 -- -- 64 (!) 29 91 % -- --  10/13/2019 1330 (!) 138/55 -- -- 65 (!) 26 90 % -- --  09/26/2019 1300 124/61 (!) 97.4 F (36.3 C) Axillary 63 20 (!) 86 % -- --  09/29/2019 1242 (!) 138/56 -- -- 66 (!) 23 (!) 88 % -- --  10/02/2019 1240 -- -- -- 75 16 (!) 83 % _0  (1.778 m) 211 lb 10.3 oz (96 kg)  10/19/2019 1239 -- -- -- -- -- (!) 83 % -- --     Recent Labs    09/21/2019 1305 09/21/2019 1337  WBC 56.6*  --   HGB 12.5* 13.6  HCT 37.9* 40.0  PLT 391  --    Recent Labs    10/06/2019 1305 10/05/2019 1337  NA 134* 134*  K 5.2* 5.3*  CL 101  --   CO2 21*  --   GLUCOSE 297*  --   BUN 36*  --   CREATININE 1.33*  --   CALCIUM 8.8*  --     Blood smear review: Normochromic and normocytic population of red blood cells.  There is no rouleaux formation.  I see no schistocytes or spherocytes.  There are no nucleated red blood cells.  White cells are markedly increased in number.  He has mature polys.  Has mature lymphocytes.  I see no hyper segmented polys.  He has no blasts.  There is no immature lymphoid cells.  Platelets are adequate number and  size.  Pathology: None    Assessment and Plan: Oscar Baker is an 86 year old white male.  He has multiple health issues.  He has CLL.  This is never been treated as he has been pretty much asymptomatic with this.  I think the problem might be him having a possible mass in the right lung.  He is in very poor condition.  I am not sure if this is a malignancy if this could even be treated.  Hopefully, this might just be some type of bronchial obstruction because the mucus.  I suspect that his immunoglobulins are going to be low.  We will send off immunoglobulin levels.  Given that he has had recurrent pneumonia, I suspect he probably will be eligible for IVIG infusions.  I know the pulmonary is following him closely.  They will manage the pleural effusion, pneumonia, and bronchial obstruction.  I do not see anything on his blood smear that looks as if the CLL is active.  In fact, I think that the CLL is quite stable.  Compared to his last CBC and differential, his white cell differential is better this time.  I suspect his white cells being elevated is from this pneumonia.  He is very nice.  Again he is hard of hearing.  He has multiple health issues.  Blood sugar is quite high.  He has the recurrent prostate cancer that is just a biochemical recurrence.  I really do not think this is an issue.  I think get him off Coumadin and onto one of the new oral anticoagulants will clearly improve his quality of life.  I am sure cardiology will be involved with  his care.  I am sure he has multiple, multiple doctors who sees him.  He is wonderful to talk to.  His wife is very nice.  She helps provide a lot of information.  We will follow him while he is in the hospital.  We will see about giving him IVIG depending on what the immunoglobulin levels are.  Lattie Haw, MD  [facility] 6:2

## 2019-10-16 NOTE — Progress Notes (Addendum)
ANTICOAGULATION CONSULT NOTE - Initial Consult  Pharmacy Consult for warfarin Indication: atrial fibrillation  Allergies  Allergen Reactions  . Altace [Ramipril] Other (See Comments)    "throat felt like had a knot in it"  . Augmentin [Amoxicillin-Pot Clavulanate] Diarrhea    severe  . Codeine Nausea And Vomiting    Nausea and vomiting   . Simvastatin Other (See Comments)    Leg aches    Patient Measurements: Height: _0  (177.8 cm) Weight: 96 kg (211 lb 10.3 oz) IBW/kg (Calculated) : 73  Vital Signs: Temp: 97.4 F (36.3 C) (04/26 1300) Temp Source: Axillary (04/26 1300) BP: 132/49 (04/26 1430) Pulse Rate: 63 (04/26 1430)  Labs: Recent Labs    09/26/2019 1305 10/11/2019 1337  HGB 12.5* 13.6  HCT 37.9* 40.0  PLT 391  --   APTT 68*  --   LABPROT 62.2*  --   INR 7.2*  --   CREATININE 1.33*  --     Estimated Creatinine Clearance: 47.2 mL/min (A) (by C-G formula based on SCr of 1.33 mg/dL (H)).   Medical History: Past Medical History:  Diagnosis Date  . Asthma 1950's   history of  . Atrial fibrillation ([place])   . Bilateral carpal tunnel syndrome   . Bilateral lower extremity edema   . Bladder tumor   . Chronic systolic heart failure (HCC)    Echo 1/19: Mild LVH, EF 45-50, inf HK, MAC, severe LAE, severe RAE // Echo 7/15: Mild LVH, mod focal basal sept hypertrophy, EF 55-60, AV peak and mean 16/9, trivial MR, mod LAE, PASP 38  . CLL (chronic lymphocytic leukemia) [facility]) oncologist-  dr Ilene Qua--   dx [phone] ;  Lymphocytosis, CLL - per lov note 05-11-2017 currently under active survillance,  CT 04-17-2014 show very small lymphadenopathy, no indication for treatment  . Coronary artery disease    cardiologist-  dr Cathie Olden--  08-18-2017 Intermittant risk nuclear study w/ large area of inferior infartion with no evidence ishcemia   . 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 Diabetes mellitus type 2, noninsulin dependent ([place])   . Elevated PSA    since prostatectomy but now resolved   . Hematuria 04/2017  . History of ear infection    Right  . History of MI (myocardial infarction)    per myoview nuclear study 08-18-2017 , unknown when  . History of shingles 08/2017   L ear and scalp, possible  . Hyperlipidemia   . Hypertension   . Ischemic cardiomyopathy 09/01/2017   Presumed +CAD with Nuclear stress test 08/18/17 - Inferior scar, no ischemia, intermediate risk // med management unless +angina or worse dyspnea  . OA (osteoarthritis)   . Pacemaker 02/08/2014   followed by dr g. taylor--  single chamber Biotronik due to SSS  . Permanent atrial fibrillation ([place])   . Pneumonia 2019   Left lung  . Prostate cancer [facility]) urologist-  dr Diona Fanti   dx 2004--  Gleason 8, PSA 10.45--  11-28-2002  s/p  radical prostatectomy;  recurrent w/ increasing PSA, started ADT treatment  . RBBB (right bundle branch block)   . Sick sinus syndrome ([place])    a-Flutter with episodes of bradycardia; S/P Biotronik (serial number [id number]) 02-08-2014  . Urinary incontinence   . Wears hearing aid in right ear    receiver and transmitter    Medications:  (Not in a hospital admission)   Assessment: 41 YOM with h/o AFib on warfarin presents with shortness of breath concerning for sepsis and  elevated INR of of 7.2. MD has ordered a dose of IV vitamin K 1 mg x 1 dose today. H/H slightly low Plt wnl.   Goal of Therapy:  INR 2-3 Monitor platelets by anticoagulation protocol: Yes   Plan:  -Hold warfarin today -F/u INR in AM  Albertina Parr, PharmD., BCPS, BCCCP Clinical Pharmacist Clinical phone for 09/22/2019 until 11pm: [phone] If after 11pm, please refer to [facility] for unit-specific pharmacist   Addendum: Now adding IV Zosyn for HCAP. WBC elevated 56. LA 2.2. SCr 1.33. Will start Zosyn 3.375 gm IV Q  8 hours (EI infusion).   Albertina Parr, PharmD., BCPS, BCCCP Clinical Pharmacist

## 2019-10-17 ENCOUNTER — Inpatient Hospital Stay (HOSPITAL_COMMUNITY): Payer: Medicare Other

## 2019-10-17 DIAGNOSIS — I350 Nonrheumatic aortic (valve) stenosis: Secondary | ICD-10-CM | POA: Diagnosis not present

## 2019-10-17 DIAGNOSIS — J90 Pleural effusion, not elsewhere classified: Secondary | ICD-10-CM | POA: Diagnosis not present

## 2019-10-17 DIAGNOSIS — J690 Pneumonitis due to inhalation of food and vomit: Secondary | ICD-10-CM | POA: Diagnosis not present

## 2019-10-17 DIAGNOSIS — R6520 Severe sepsis without septic shock: Secondary | ICD-10-CM | POA: Diagnosis not present

## 2019-10-17 DIAGNOSIS — A419 Sepsis, unspecified organism: Secondary | ICD-10-CM

## 2019-10-17 DIAGNOSIS — J189 Pneumonia, unspecified organism: Secondary | ICD-10-CM | POA: Diagnosis not present

## 2019-10-17 DIAGNOSIS — I5043 Acute on chronic combined systolic (congestive) and diastolic (congestive) heart failure: Secondary | ICD-10-CM

## 2019-10-17 LAB — BASIC METABOLIC PANEL
Anion gap: 9 (ref 5–15)
BUN: 44 mg/dL — ABNORMAL HIGH (ref 8–23)
CO2: 25 mmol/L (ref 22–32)
Calcium: 8.8 mg/dL — ABNORMAL LOW (ref 8.9–10.3)
Chloride: 99 mmol/L (ref 98–111)
Creatinine, Ser: 1.3 mg/dL — ABNORMAL HIGH (ref 0.61–1.24)
GFR calc Af Amer: 58 mL/min — ABNORMAL LOW (ref >60)
GFR calc non Af Amer: 50 mL/min — ABNORMAL LOW (ref >60)
Glucose, Bld: 337 mg/dL — ABNORMAL HIGH (ref 70–99)
Potassium: 6.6 mmol/L (ref 3.5–5.1)
Sodium: 133 mmol/L — ABNORMAL LOW (ref 135–145)

## 2019-10-17 LAB — ECHOCARDIOGRAM COMPLETE
Height: 70 in
Weight: 3350.99 oz

## 2019-10-17 LAB — URINALYSIS, ROUTINE W REFLEX MICROSCOPIC
Bilirubin Urine: NEGATIVE
Glucose, UA: 50 mg/dL — AB
Hgb urine dipstick: NEGATIVE
Ketones, ur: NEGATIVE mg/dL
Leukocytes,Ua: NEGATIVE
Nitrite: NEGATIVE
Protein, ur: NEGATIVE mg/dL
Specific Gravity, Urine: 1.019 (ref 1.005–1.030)
pH: 5 (ref 5.0–8.0)

## 2019-10-17 LAB — GLUCOSE, CAPILLARY
Glucose-Capillary: 288 mg/dL — ABNORMAL HIGH (ref 70–99)
Glucose-Capillary: 284 mg/dL — ABNORMAL HIGH (ref 70–99)
Glucose-Capillary: 203 mg/dL — ABNORMAL HIGH (ref 70–99)
Glucose-Capillary: 179 mg/dL — ABNORMAL HIGH (ref 70–99)
Glucose-Capillary: 227 mg/dL — ABNORMAL HIGH (ref 70–99)

## 2019-10-17 LAB — BODY FLUID CELL COUNT WITH DIFFERENTIAL
Lymphs, Fluid: 2 %
Monocyte-Macrophage-Serous Fluid: 2 % — ABNORMAL LOW (ref 50–90)
Neutrophil Count, Fluid: 96 % — ABNORMAL HIGH (ref 0–25)
Total Nucleated Cell Count, Fluid: 3800 cu mm — ABNORMAL HIGH (ref 0–1000)

## 2019-10-17 LAB — POTASSIUM: Potassium: 5.1 mmol/L (ref 3.5–5.1)

## 2019-10-17 LAB — PROTEIN, PLEURAL OR PERITONEAL FLUID: Total protein, fluid: 3.7 g/dL

## 2019-10-17 LAB — PROTIME-INR
INR: 1.8 — ABNORMAL HIGH (ref 0.8–1.2)
Prothrombin Time: 20.5 seconds — ABNORMAL HIGH (ref 11.4–15.2)

## 2019-10-17 LAB — CBC
HCT: 37.9 % — ABNORMAL LOW (ref 39.0–52.0)
Hemoglobin: 12.3 g/dL — ABNORMAL LOW (ref 13.0–17.0)
MCH: 33 pg (ref 26.0–34.0)
MCHC: 32.5 g/dL (ref 30.0–36.0)
MCV: 101.6 fL — ABNORMAL HIGH (ref 80.0–100.0)
Platelets: 428 10*3/uL — ABNORMAL HIGH (ref 150–400)
RBC: 3.73 MIL/uL — ABNORMAL LOW (ref 4.22–5.81)
RDW: 18.6 % — ABNORMAL HIGH (ref 11.5–15.5)
WBC: 70.3 10*3/uL (ref 4.0–10.5)
nRBC: 0.1 % (ref 0.0–0.2)

## 2019-10-17 LAB — GLUCOSE, PLEURAL OR PERITONEAL FLUID: Glucose, Fluid: 178 mg/dL

## 2019-10-17 MED ORDER — INSULIN ASPART 100 UNIT/ML IV SOLN
10.0000 [IU] | Freq: Once | INTRAVENOUS | Status: AC
  Administered 2019-10-17: 10 [IU] via INTRAVENOUS

## 2019-10-17 MED ORDER — FENTANYL CITRATE (PF) 100 MCG/2ML IJ SOLN: 25.0000 ug | Freq: Once | INTRAMUSCULAR | Status: AC

## 2019-10-17 MED ORDER — FUROSEMIDE 10 MG/ML IJ SOLN
40.0000 mg | Freq: Once | INTRAMUSCULAR | Status: AC
  Administered 2019-10-17: 40 mg via INTRAVENOUS
  Filled 2019-10-17: qty 4

## 2019-10-17 MED ORDER — SODIUM ZIRCONIUM CYCLOSILICATE 10 G PO PACK
10.0000 g | PACK | Freq: Every day | ORAL | Status: AC
  Administered 2019-10-17: 10 g via ORAL
  Filled 2019-10-17: qty 1

## 2019-10-17 MED ORDER — INSULIN DETEMIR 100 UNIT/ML ~~LOC~~ SOLN
10.0000 [IU] | Freq: Every day | SUBCUTANEOUS | Status: DC
  Administered 2019-10-18: 10 [IU] via SUBCUTANEOUS
  Filled 2019-10-17: qty 0.1

## 2019-10-17 MED ORDER — VANCOMYCIN HCL 2000 MG/400ML IV SOLN
2000.0000 mg | Freq: Once | INTRAVENOUS | Status: AC
  Administered 2019-10-17: 2000 mg via INTRAVENOUS
  Filled 2019-10-17: qty 400

## 2019-10-17 MED ORDER — SODIUM CHLORIDE (PF) 0.9 % IJ SOLN
10.0000 mg | Freq: Two times a day (BID) | INTRAMUSCULAR | Status: DC
  Administered 2019-10-17: 10 mg via INTRAPLEURAL
  Filled 2019-10-17 (×4): qty 10

## 2019-10-17 MED ORDER — STERILE WATER FOR INJECTION IJ SOLN
5.0000 mg | Freq: Two times a day (BID) | RESPIRATORY_TRACT | Status: DC
  Administered 2019-10-17: 5 mg via INTRAPLEURAL
  Filled 2019-10-17 (×4): qty 5

## 2019-10-17 MED ORDER — FUROSEMIDE 10 MG/ML IJ SOLN: 20.0000 mg | Freq: Two times a day (BID) | INTRAMUSCULAR | Status: DC

## 2019-10-17 MED ORDER — ACETAMINOPHEN 500 MG PO TABS
1000.0000 mg | ORAL_TABLET | Freq: Three times a day (TID) | ORAL | Status: DC
  Filled 2019-10-17 (×2): qty 2

## 2019-10-17 MED ORDER — INSULIN DETEMIR 100 UNIT/ML ~~LOC~~ SOLN
7.0000 [IU] | Freq: Two times a day (BID) | SUBCUTANEOUS | Status: DC
  Administered 2019-10-17: 7 [IU] via SUBCUTANEOUS
  Filled 2019-10-17 (×2): qty 0.07

## 2019-10-17 MED ORDER — FUROSEMIDE 10 MG/ML IJ SOLN
20.0000 mg | Freq: Once | INTRAMUSCULAR | Status: AC
  Administered 2019-10-17: 20 mg via INTRAVENOUS
  Filled 2019-10-17: qty 2

## 2019-10-17 MED ORDER — VANCOMYCIN HCL 1250 MG/250ML IV SOLN
1250.0000 mg | INTRAVENOUS | Status: DC
  Administered 2019-10-18: 1250 mg via INTRAVENOUS
  Filled 2019-10-17: qty 250

## 2019-10-17 MED ORDER — MORPHINE SULFATE (PF) 2 MG/ML IV SOLN
1.0000 mg | INTRAVENOUS | Status: AC
  Administered 2019-10-17: 1 mg via INTRAVENOUS
  Filled 2019-10-17: qty 1

## 2019-10-17 MED ORDER — FENTANYL CITRATE (PF) 100 MCG/2ML IJ SOLN
INTRAMUSCULAR | Status: AC
  Administered 2019-10-17: 25 ug via INTRAVENOUS
  Filled 2019-10-17: qty 2

## 2019-10-17 NOTE — ED Notes (Signed)
Attempted to call report x 1  

## 2019-10-17 NOTE — Progress Notes (Signed)
Patient is now resting in bed on Optiflow 30L at 100%, still tachypneic with RR 29-32. No complaints of discomfort or Chest pain.   Denny,NP is aware of the patient's condition. RRT will continue to monitor patient throughout the night.

## 2019-10-17 NOTE — Progress Notes (Signed)
Tpa/dnase administered via right chest tube. Tube clamped.  Pt tolerated well  Will unclamp and place back to sxn in 1 hr  Erick Colace ACNP-BC [place] Pager # [phone] OR # [phone] if no answer

## 2019-10-17 NOTE — Progress Notes (Signed)
Inpatient Diabetes Program Recommendations  AACE/ADA: New Consensus Statement on Inpatient Glycemic Control (2015)  Target Ranges:  Prepandial:   less than 140 mg/dL      Peak postprandial:   less than 180 mg/dL (1-2 hours)      Critically ill patients:  140 - 180 mg/dL   Lab Results  Component Value Date   GLUCAP 284 (H) 10/17/2019   HGBA1C 6.9 (H) 10/05/2019    Review of Glycemic Control  Diabetes history: DM2 Outpatient Diabetes medications: Glucotrol 2.5 mg + Metformin 1 gm am + 850 mg pm Current orders for Inpatient glycemic control: Novolog sensitive correction 0-9 units tid  Inpatient Diabetes Program Recommendations:   While oral DM medications held, add -Levemir 10 units daily (0.1 units/kg/dl x 95 kg = 9.5)  Thank you, Bethena Roys E. Dathan Attia, RN, MSN, CDE  Diabetes Coordinator Inpatient Glycemic Control Team Team Pager [phone] (8am-5pm) 10/17/2019 11:13 AM

## 2019-10-17 NOTE — Progress Notes (Signed)
  Echocardiogram 2D Echocardiogram has been performed.  Oscar Baker 10/17/2019, 9:39 AM

## 2019-10-17 NOTE — Progress Notes (Signed)
Patient is now resting in bed on Optiflow 30L at 100%, still tachypneic with RR 29-32. No complaints of discomfort or Chest pain.   Denny,NP is aware of the patient's condition. RRT will continue to monitor patient throughout the night.  Order for Lasix 20mg  IV was ordered.

## 2019-10-17 NOTE — Procedures (Signed)
Chest Tube Insertion Procedure Note  Indications:  Clinically significant parapneumonic effusion  Pre-operative Diagnosis: Effusion  Post-operative Diagnosis: Effusion  Procedure Details  Informed consent was obtained for the procedure, including sedation.  Risks of lung perforation, hemorrhage, arrhythmia, and adverse drug reaction were discussed.   US demonstrated loculated pleural fluid throughout the lower right hemithorax. Fluid pocked was found and marked. After sterile skin prep, using standard technique, a 14 French tube was placed in the right posterolateral location. Small volume of orange pleural fluid obtained. Chest tube tidaling, no air leak. [place] after placement demonstrated ongoing loculated fluid.  Findings: 75 ml of serosanguinous fluid obtained  Estimated Blood Loss:  Minimal         Specimens:  Sent serosanguinous fluid              Complications:  None; patient tolerated the procedure well.         Disposition: to remain on floor         Condition: stable  Attending Attestation: I performed the procedure.  Post-CXR demonstrated chest tube in posterior location and dense R lower and lateral hemithoax opacities.

## 2019-10-17 NOTE — Progress Notes (Addendum)
This nurse opened stopcock to drain indwelling medication placed and directed by provider.

## 2019-10-17 NOTE — Progress Notes (Addendum)
PROGRESS NOTE    Oscar Baker  [id number] DOB: 1933-04-16 DOA: 10/15/2019 PCP: [name], MD   Chief Complaint  Patient presents with  . Shortness of Breath    Brief Narrative:   Oscar Baker is Oscar Baker 86 y.o. male with medical history significant of CLL on remission, CAD, borderline systolic CHF chronic, Oscar Baker. fib on Coumadin, IIDM, HTN, HLD, SSS status post PPM, presented with increasing short of breath dry cough and right-sided pleuritic chest pain for 3 days.  Symptoms started suddenly 3 days ago, cough was dry at the beginning and then became productive of greenish sputum, denied any fever or chills, chest pain started 2 days ago, sharp-like located on the right sided rib cage, worsening with cough and movement.  Before this, patient had Cala Kruckenberg few days of loose bowel movement, but denied any nauseous vomiting or abdominal pain.  And diarrhea subsided few days ago.  He was admitted with acute hypoxic respiratory failure secondary to pneumonia with Easter Kennebrew loculated effusion.  Pulmonology and oncology following.  Now s/p thoracentesis.   Assessment & Plan:   Active Problems:   Sepsis ([place])   Supratherapeutic INR  Acute Hypoxic Respiratory Failure  Pneumonia with large loculated R sided Effusion  Sepsis - currently requiring 30 L HFNC - Continue zosyn, add vancomycin - follow MRSA PCR and sputum cx - follow blood cx - s/p thoracentesis 4/27 -> follow labs  - CXR pending - concern for possible malignancy on imaging  Leukocytosis: appreciate oncology recs - per Dr. Alen Blew, WBC elevation reactive 2/2 infection and pneumonia without suggestion of transformation of CLL.  Immunoglobulins intact without need for IVIG.   Coumadin induced coagulopathy INR 7.2 on presentation -> improved, now subtherapeutic  S/p vit K x1  Continue to hold anticoagulation for now  Hyperkalemia: repeat improved, follow.  Lokelma x1.  AKI Baseline creatinine <1 Creatinine on presentation ~1.3 Continue to  monitor  Check bladder scan I/O, daily weights Follow UA If worsening or not improving, follow renal US  Chronic systolic CHF Monitor volume status Lasix on hold I/O, daily weights Echo with EF 30-35% with akinesis of the distal septum and apex (see report) Will consult cardiology   HTN PRN hydral ordered  T2DM: SSI, will add levemir  Deconditioning Hold PT evaluation until breathing status stabilized  Lesion within Proximal Left Humerus with Associated Chondroid Matrix: Suspicious for low grade chondrosarcoma Will need dedicated L humerus films, hold for now with resp status  DVT prophylaxis: SCD Code Status: DNR Family Communication: none at bedside - wife over phone Disposition:   Status is: Inpatient  Remains inpatient appropriate because:Inpatient level of care appropriate due to severity of illness   Dispo: The patient is from: Home              Anticipated d/c is to: unknown              Anticipated d/c date is: > 3 days              Patient currently is not medically stable to d/c.   Consultants:   PCCM  Procedures:  4/27 chest tube  Echo IMPRESSIONS    1. Akinesis of the distal septum and apex; overall moderate to severe LV  dysfunction; mild LVH; biatrial enlargement; mild RVE with severe RV  dysfunction; mild AS (AVA 1.8 cm2 by continuity equation).  2. Left ventricular ejection fraction, by estimation, is 30 to 35%. The  left ventricle has moderate to severely decreased  function. The left  ventricle demonstrates regional wall motion abnormalities (see scoring  diagram/findings for description). There  is mild left ventricular hypertrophy. Left ventricular diastolic function  could not be evaluated.  3. Right ventricular systolic function is severely reduced. The right  ventricular size is mildly enlarged. There is moderately elevated  pulmonary artery systolic pressure.  4. Left atrial size was moderately dilated.  5. Right atrial  size was severely dilated.  6. The mitral valve is normal in structure. Trivial mitral valve  regurgitation. No evidence of mitral stenosis.  7. The aortic valve is tricuspid. Aortic valve regurgitation is not  visualized. Mild aortic valve stenosis.  8. The inferior vena cava is normal in size with greater than 50%  respiratory variability, suggesting right atrial pressure of 3 mmHg.   Antimicrobials:  Anti-infectives (From admission, onward)   Start     Dose/Rate Route Frequency Ordered Stop   11-10-19 0900  vancomycin (VANCOREADY) IVPB 1250 mg/250 mL     1,250 mg 166.7 mL/hr over 90 Minutes Intravenous Every 24 hours 10/17/19 0932     10/17/19 0800  vancomycin (VANCOREADY) IVPB 2000 mg/400 mL     2,000 mg 200 mL/hr over 120 Minutes Intravenous  Once 10/17/19 0753 10/17/19 1326   10/17/19 0000  piperacillin-tazobactam (ZOSYN) IVPB 3.375 g     3.375 g 12.5 mL/hr over 240 Minutes Intravenous Every 8 hours 09/24/2019 1625     09/21/2019 1630  piperacillin-tazobactam (ZOSYN) IVPB 3.375 g     3.375 g 100 mL/hr over 30 Minutes Intravenous  Once 10/13/2019 1538 09/27/2019 2231   09/23/2019 1345  cefTRIAXone (ROCEPHIN) 2 g in sodium chloride 0.9 % 100 mL IVPB     2 g 200 mL/hr over 30 Minutes Intravenous  Once 09/25/2019 1330 10/15/2019 1441   09/28/2019 1345  azithromycin (ZITHROMAX) 500 mg in sodium chloride 0.9 % 250 mL IVPB     500 mg 250 mL/hr over 60 Minutes Intravenous  Once 10/12/2019 1330 10/17/2019 1501      Subjective: SOB is better since oxygen No pain  Objective: Vitals:   10/17/19 0815 10/17/19 0820 10/17/19 0823 10/17/19 1139  BP:   (!) 121/54   Pulse: 62 67 70 64  Resp: (!) 24 (!) 28 (!) 22 (!) 35  Temp:      TempSrc:      SpO2: 95% 96% 98% 99%  Weight:      Height:        Intake/Output Summary (Last 24 hours) at 10/17/2019 1430 Last data filed at 10/17/2019 0654 Gross per 24 hour  Intake 908.33 ml  Output 700 ml  Net 208.33 ml   Filed Weights   10/15/2019 1240 10/17/19  0232  Weight: 96 kg 95 kg    Examination:  General exam: Appears calm and comfortable  Respiratory system: diffuse rhonchi Cardiovascular system: S1 & S2 heard, RRR. Gastrointestinal system: Abdomen is nondistended, soft and nontender Central nervous system: Alert and oriented. No focal neurological deficits. Extremities: no LEE Skin: No rashes, lesions or ulcers Psychiatry: Judgement and insight appear normal. Mood & affect appropriate.     Data Reviewed: I have personally reviewed following labs and imaging studies  CBC: Recent Labs  Lab 09/22/2019 1305 09/30/2019 1337 10/17/19 0240  WBC 56.6*  --  70.3*  NEUTROABS 21.5*  --   --   HGB 12.5* 13.6 12.3*  HCT 37.9* 40.0 37.9*  MCV 100.8*  --  101.6*  PLT 391  --  428*  Basic Metabolic Panel: Recent Labs  Lab 10/11/2019 1305 09/25/2019 1337 10/17/19 0240 10/17/19 0809  NA 134* 134* 133*  --   K 5.2* 5.3* 6.6* 5.1  CL 101  --  99  --   CO2 21*  --  25  --   GLUCOSE 297*  --  337*  --   BUN 36*  --  44*  --   CREATININE 1.33*  --  1.30*  --   CALCIUM 8.8*  --  8.8*  --     GFR: Estimated Creatinine Clearance: 48.1 mL/min (Maria Coin) (by C-G formula based on SCr of 1.3 mg/dL (H)).  Liver Function Tests: Recent Labs  Lab 10/19/2019 1305  AST 25  ALT 15  ALKPHOS 80  BILITOT 1.3*  PROT 6.8  ALBUMIN 2.9*    CBG: Recent Labs  Lab 10/15/2019 1846 10/07/2019 2247 10/17/19 0215 10/17/19 0857 10/17/19 1144  GLUCAP 278* 291* 288* 284* 203*     Recent Results (from the past 240 hour(s))  Blood Culture (routine x 2)     Status: None (Preliminary result)   Collection Time: 10/20/2019 12:55 PM   Specimen: BLOOD  Result Value Ref Range Status   Specimen Description BLOOD RIGHT ANTECUBITAL  Final   Special Requests   Final    BOTTLES DRAWN AEROBIC AND ANAEROBIC Blood Culture adequate volume   Culture   Final    NO GROWTH < 24 HOURS Performed at [facility], [place] [address], [place], [place] [address]    Report  Status PENDING  Incomplete  Respiratory Panel by RT PCR (Flu Shar Paez&B, Covid) - Nasopharyngeal Swab     Status: None   Collection Time: 10/11/2019  2:11 PM   Specimen: Nasopharyngeal Swab  Result Value Ref Range Status   SARS Coronavirus 2 by RT PCR NEGATIVE NEGATIVE Final    Comment: (NOTE) SARS-CoV-2 target nucleic acids are NOT DETECTED. The SARS-CoV-2 RNA is generally detectable in upper respiratoy specimens during the acute phase of infection. The lowest concentration of SARS-CoV-2 viral copies this assay can detect is 131 copies/mL. Reniah Cottingham negative result does not preclude SARS-Cov-2 infection and should not be used as the sole basis for treatment or other patient management decisions. Cincere Zorn negative result may occur with  improper specimen collection/handling, submission of specimen other than nasopharyngeal swab, presence of viral mutation(s) within the areas targeted by this assay, and inadequate number of viral copies (<131 copies/mL). Ariyan Brisendine negative result must be combined with clinical observations, patient history, and epidemiological information. The expected result is Negative. Fact Sheet for Patients:  PinkCheek.be Fact Sheet for Healthcare Providers:  GravelBags.it This test is not yet ap proved or cleared by the [place] FDA and  has been authorized for detection and/or diagnosis of SARS-CoV-2 by FDA under an Emergency Use Authorization (EUA). This EUA will remain  in effect (meaning this test can be used) for the duration of the COVID-19 declaration under Section 564(b)(1) of the Act, 21 U.S.C. section 360bbb-3(b)(1), unless the authorization is terminated or revoked sooner.    Influenza Jaszmine Navejas by PCR NEGATIVE NEGATIVE Final   Influenza B by PCR NEGATIVE NEGATIVE Final    Comment: (NOTE) The Xpert Xpress SARS-CoV-2/FLU/RSV assay is intended as an aid in  the diagnosis of influenza from Nasopharyngeal swab specimens and    should not be used as Doll Frazee sole basis for treatment. Nasal washings and  aspirates are unacceptable for Xpert Xpress SARS-CoV-2/FLU/RSV  testing. Fact Sheet for Patients: PinkCheek.be Fact Sheet for  Healthcare Providers: GravelBags.it This test is not yet approved or cleared by the [place] and  has been authorized for detection and/or diagnosis of SARS-CoV-2 by  FDA under an Emergency Use Authorization (EUA). This EUA will remain  in effect (meaning this test can be used) for the duration of the  Covid-19 declaration under Section 564(b)(1) of the Act, 21  U.S.C. section 360bbb-3(b)(1), unless the authorization is  terminated or revoked. Performed at [facility], [place] [address], [place], [place] [address]   Blood Culture (routine x 2)     Status: None (Preliminary result)   Collection Time: 10/04/2019  2:58 PM   Specimen: BLOOD  Result Value Ref Range Status   Specimen Description BLOOD BLOOD RIGHT FOREARM  Final   Special Requests   Final    BOTTLES DRAWN AEROBIC AND ANAEROBIC Blood Culture adequate volume   Culture   Final    NO GROWTH < 24 HOURS Performed at [facility], [place] [address] [place], [place] [address]    Report Status PENDING  Incomplete  Body fluid culture (includes gram stain)     Status: None (Preliminary result)   Collection Time: 10/17/19  8:22 AM   Specimen: Pleural Fluid  Result Value Ref Range Status   Specimen Description FLUID  Final   Special Requests PLEURAL  Final   Gram Stain   Final    MODERATE WBC PRESENT, PREDOMINANTLY PMN NO ORGANISMS SEEN Performed at [facility], [place] [address], [place], [place] [address]    Culture PENDING  Incomplete   Report Status PENDING  Incomplete         Radiology Studies: CT CHEST WO CONTRAST  Result Date: 09/30/2019 CLINICAL DATA:  Shortness of breath. EXAM: CT CHEST WITHOUT CONTRAST TECHNIQUE: Multidetector CT imaging of  the chest was performed following the standard protocol without IV contrast. COMPARISON:  Chest CT 11/23/2017 and chest x-ray from today. FINDINGS: Cardiovascular: The heart is mildly enlarged. No pericardial effusion. There is tortuosity, ectasia and calcification of the thoracic aorta. Extensive coronary artery calcifications are noted. Mediastinum/Nodes: Enlarged mediastinal and hilar lymph nodes progressive since prior study. This could be inflammatory/reactive. 11 mm right paratracheal nodes on image number 52/3. 21 mm subcarinal lymph node on image number 73/3. The esophagus is grossly normal. Lungs/Pleura: There is Lateesha Bezold large loculated right pleural fluid collection along with fluid in the major and minor fissures. This measures as simple fluid. I do not think this is pleural tumor. Empyema is possible. Dense masslike area of consolidation in the right infrahilar region. Could not exclude neoplasm with obstructive atelectasis in the right lower lobe and right middle lobe. T the bronchus intermedius is markedly compressed and occluded distally. The left lung is relatively clear. Some streaky areas of atelectasis. Upper Abdomen: No significant upper abdominal findings. No hepatic or adrenal gland lesions are identified. Advanced aortic calcifications. Musculoskeletal: Numerous borderline enlarged bilateral axillary and supraclavicular lymph nodes. These were also present on the prior CT scan but are slightly more pronounced. No significant bony findings. Advanced degenerative changes involving the spine. IMPRESSION: 1. Dense masslike area of consolidation in the right infrahilar region with marked compression and occlusion of the bronchus intermedius. Findings very suspicious for neoplasm with obstructive atelectasis in the right lower lobe and right middle lobe. Bronchoscopy may be helpful for further evaluation. 2. Large right sided loculated pleural fluid no obvious pleural mass. 3. Enlarged mediastinal and  hilar lymph nodes progressive since prior  study. This could be inflammatory/reactive or neoplastic. 4. Numerous borderline enlarged bilateral axillary and supraclavicular lymph nodes, slightly more pronounced. 5. Advanced atherosclerotic calcifications involving the aorta and coronary arteries. 6. Aortic atherosclerosis. Aortic Atherosclerosis (ICD10-I70.0). Aortic Atherosclerosis (ICD10-I70.0). Electronically Signed   By: Marijo Sanes M.D.   On: 09/23/2019 15:33   DG CHEST PORT 1 VIEW  Result Date: 10/17/2019 CLINICAL DATA:  Chest tube placement, shortness of breath, sore chest. EXAM: PORTABLE CHEST 1 VIEW COMPARISON:  Chest CT 09/27/2019, chest radiograph 10/15/2019 FINDINGS: There has been interval placement of Claudette Wermuth right-sided chest tube. Two sites of kinking are noted within the chest tube at the level of the mid to lower right chest (see annotations on image). Latayvia Mandujano trace right apical pneumothorax has developed since prior chest CT 09/21/2019. Persistent at least moderate sized right pleural effusion which is likely loculated given findings on chest CT performed 10/01/2019. Shan Valdes masslike area of consolidation within the right infrahilar region was better appreciated on this prior study. Right middle and lower lobe atelectasis have progressed. The left lung remains clear. Unchanged cardiomediastinal silhouette. Aortic atherosclerosis. Redemonstrated left chest pacer device. There is Malashia Kamaka incompletely assessed lesion within the proximal left humerus with associated chondroid matrix. Chondroid matrix extends beyond the bony margins. These results will be called to the ordering clinician or representative by the Radiologist Assistant, and communication documented in the PACS or [employer]. IMPRESSION: Interval placement of Dominick Morella right-sided chest tube. There are two sites of kinking within the chest tube at the level of the mid to lower chest. Joel Cowin trace right apical pneumothorax is now present. Persistent at least  moderate right pleural effusion which is likely loculated given findings on chest CT performed 10/15/2019. Cadee Agro masslike area of consolidation suspicious for neoplasm within the right infrahilar region was better appreciated on this prior exam. Right middle and lower lobe atelectasis have progressed. Incompletely assessed lesion within the proximal left humerus with associated chondroid matrix. 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 Dedicated radiographs of the left humerus are recommended for initial further evaluation. Electronically Signed   By: Kellie Simmering DO   On: 10/17/2019 08:50   DG Chest Port 1 View  Result Date: 10/10/2019 CLINICAL DATA:  Short of breath, cough, hypoxia EXAM: PORTABLE CHEST 1 VIEW COMPARISON:  04/09/2019 FINDINGS: Single frontal view of the chest demonstrates stable single lead pacemaker. Cardiac silhouette is unchanged. There is new veiling opacity at the right lung base consistent with consolidation and effusion. No pneumothorax. IMPRESSION: 1. Right basilar consolidation and effusion, compatible with pneumonia. Electronically Signed   By: Randa Ngo M.D.   On: 09/22/2019 13:17   ECHOCARDIOGRAM COMPLETE  Result Date: 10/17/2019    ECHOCARDIOGRAM REPORT   Patient Name:   Oscar Baker Date of Exam: 10/17/2019 Medical Rec #:  [id number]      Height:       70.0 in Accession #:    [id number]     Weight:       209.4 lb Date of Birth:  [birth year]      BSA:          2.129 m Patient Age:    40 years       BP:           121/54 mmHg Patient Gender: M              HR:           70 bpm.  Exam Location:  Inpatient Procedure: 2D Echo, Cardiac Doppler and Color Doppler Indications:    Chest Pain 786.50/R07.9  History:        Patient has prior history of Echocardiogram examinations, most                 recent 07/20/2017. CHF, CAD, Arrythmias:Atrial Fibrillation; Risk                 Factors:Hypertension, Dyslipidemia  and Diabetes. Symptomatic                 bradycardia - s/p Biotronik (serial number [id number]). Ischemic                 cardiomyopathy.  Sonographer:    Clayton Lefort RDCS (AE) Referring Phys: [id number] [place]  1. Akinesis of the distal septum and apex; overall moderate to severe LV dysfunction; mild LVH; biatrial enlargement; mild RVE with severe RV dysfunction; mild AS (AVA 1.8 cm2 by continuity equation).  2. Left ventricular ejection fraction, by estimation, is 30 to 35%. The left ventricle has moderate to severely decreased function. The left ventricle demonstrates regional wall motion abnormalities (see scoring diagram/findings for description). There is mild left ventricular hypertrophy. Left ventricular diastolic function could not be evaluated.  3. Right ventricular systolic function is severely reduced. The right ventricular size is mildly enlarged. There is moderately elevated pulmonary artery systolic pressure.  4. Left atrial size was moderately dilated.  5. Right atrial size was severely dilated.  6. The mitral valve is normal in structure. Trivial mitral valve regurgitation. No evidence of mitral stenosis.  7. The aortic valve is tricuspid. Aortic valve regurgitation is not visualized. Mild aortic valve stenosis.  8. The inferior vena cava is normal in size with greater than 50% respiratory variability, suggesting right atrial pressure of 3 mmHg. FINDINGS  Left Ventricle: Left ventricular ejection fraction, by estimation, is 30 to 35%. The left ventricle has moderate to severely decreased function. The left ventricle demonstrates regional wall motion abnormalities. The left ventricular internal cavity size was normal in size. There is mild left ventricular hypertrophy. Left ventricular diastolic function could not be evaluated due to atrial fibrillation. Left ventricular diastolic function could not be evaluated. Right Ventricle: The right ventricular size is mildly enlarged.Right  ventricular systolic function is severely reduced. There is moderately elevated pulmonary artery systolic pressure. The tricuspid regurgitant velocity is 2.91 m/s, and with an assumed right atrial pressure of 15 mmHg, the estimated right ventricular systolic pressure is [id number] mmHg. Left Atrium: Left atrial size was moderately dilated. Right Atrium: Right atrial size was severely dilated. Pericardium: Trivial pericardial effusion is present. Mitral Valve: The mitral valve is normal in structure. There is mild calcification of the mitral valve leaflet(s). Normal mobility of the mitral valve leaflets. Trivial mitral valve regurgitation. No evidence of mitral valve stenosis. Tricuspid Valve: The tricuspid valve is normal in structure. Tricuspid valve regurgitation is trivial. No evidence of tricuspid stenosis. Aortic Valve: The aortic valve is tricuspid. Aortic valve regurgitation is not visualized. Mild aortic stenosis is present. Aortic valve mean gradient measures 7.0 mmHg. Aortic valve peak gradient measures 12.3 mmHg. Aortic valve area, by VTI measures 1.81 cm. Pulmonic Valve: The pulmonic valve was not well visualized. Pulmonic valve regurgitation is not visualized. No evidence of pulmonic stenosis. Aorta: The aortic root is normal in size and structure. Venous: The inferior vena cava is normal in size with greater than 50% respiratory variability, suggesting right atrial pressure of  3 mmHg. IAS/Shunts: No atrial level shunt detected by color flow Doppler. Additional Comments: Akinesis of the distal septum and apex; overall moderate to severe LV dysfunction; mild LVH; biatrial enlargement; mild RVE with severe RV dysfunction; mild AS (AVA 1.8 cm2 by continuity equation). Zion Ta pacer wire is visualized.  LEFT VENTRICLE PLAX 2D LVIDd:         5.09 cm LVIDs:         4.35 cm LV PW:         1.57 cm LV IVS:        1.69 cm LVOT diam:     2.30 cm LV SV:         59 LV SV Index:   28 LVOT Area:     4.15 cm  LV Volumes (MOD) LV  vol d, MOD A2C: 162.0 ml LV vol d, MOD A4C: 139.0 ml LV vol s, MOD A2C: 105.0 ml LV vol s, MOD A4C: 94.5 ml LV SV MOD A2C:     57.0 ml LV SV MOD A4C:     139.0 ml LV SV MOD BP:      50.8 ml RIGHT VENTRICLE             IVC RV Basal diam:  4.80 cm     IVC diam: 2.12 cm RV Mid diam:    4.90 cm RV S prime:     12.30 cm/s TAPSE (M-mode): 1.2 cm LEFT ATRIUM              Index       RIGHT ATRIUM           Index LA diam:        4.40 cm  2.07 cm/m  RA Area:     36.50 cm LA Vol (A2C):   104.0 ml 48.86 ml/m RA Volume:   152.00 ml 71.41 ml/m LA Vol (A4C):   85.4 ml  40.12 ml/m LA Biplane Vol: 95.2 ml  44.72 ml/m  AORTIC VALVE AV Area (Vmax):    1.89 cm AV Area (Vmean):   1.70 cm AV Area (VTI):     1.81 cm AV Vmax:           175.20 cm/s AV Vmean:          124.200 cm/s AV VTI:            0.328 m AV Peak Grad:      12.3 mmHg AV Mean Grad:      7.0 mmHg LVOT Vmax:         79.52 cm/s LVOT Vmean:        50.940 cm/s LVOT VTI:          0.143 m LVOT/AV VTI ratio: 0.44  AORTA Ao Root diam: 3.50 cm Ao Asc diam:  3.70 cm TRICUSPID VALVE TR Peak grad:   33.9 mmHg TR Vmax:        291.00 cm/s  SHUNTS Systemic VTI:  0.14 m Systemic Diam: 2.30 cm Kirk Ruths MD Electronically signed by Kirk Ruths MD Signature Date/Time: 10/17/2019/12:13:41 PM    Final         Scheduled Meds: . alteplase (TPA) for intrapleural administration  10 mg Intrapleural Q12H   And  . pulmozyme (DORNASE) for intrapleural administration  5 mg Intrapleural Q12H  . atorvastatin  20 mg Oral Daily  . fenofibrate  160 mg Oral Daily  . gabapentin  100 mg Oral BID  . hydrocortisone cream   Topical BID  .  insulin aspart  0-9 Units Subcutaneous TID WC  . ipratropium  0.5 mg Nebulization Q4H  . lactobacillus acidophilus  2 tablet Oral TID  . multivitamin with minerals  1 tablet Oral Daily  . prednisoLONE acetate  4 drop Both Eyes BID  . vitamin B-12  1,000 mcg Oral Daily   Continuous Infusions: . piperacillin-tazobactam (ZOSYN)  IV 3.375 g  (10/17/19 0945)  . [START ON 15-Nov-2019] vancomycin       LOS: 1 day    Time spent: over 30 min 45 min critical care time with AHRF on HHFNC with loculated effusion    Fayrene Helper, MD [facility]   To contact the attending provider between 7A-7P or the covering provider during after hours 7P-7A, please log into the web site [email or web address] and access using universal  password for that web site. If you do not have the password, please call the hospital operator.  10/17/2019, 2:30 PM

## 2019-10-17 NOTE — Progress Notes (Signed)
NAME:  Oscar Baker, MRN:  [id number], DOB:  Nov 13, 1932, LOS: 1 ADMISSION DATE:  10/14/2019, CONSULTATION DATE:  4/26 REFERRING MD:  [email or web address], CHIEF COMPLAINT:  Respiratory failure w/ pleural effusion    Brief History   86 year old male admitted 4/26 with acute hypoxic respiratory failure, working diagnosis is pneumonia and pleural effusion   Past Medical History  CLL, on surveillance, congestive heart failure, HFrEF, atrial fibrillation on Coumadin coronary artery disease, diabetes type 2, elevated PSA, , Hypertension, prior empyema, on left, chronic cough with chronic aspiration Significant Hospital Events   426 admitted; abx started. Given Vit K and coumadin held. INR 7.8 4/27 chest tube placed TPA/DNase (first administered at 1030am) Consults:  Pulmonary consulted for pleural effusion  Procedures:    Significant Diagnostic Tests:  CT chest 4/26:  1. Dense masslike area of consolidation in the right infrahilar region with marked compression and occlusion of the bronchus intermedius. Findings very suspicious for neoplasm with obstructive atelectasis in the right lower lobe and right middle lobe. Bronchoscopy may be helpful for further evaluation. 2. Large right sided loculated pleural fluid no obvious pleural mass.3. Enlarged mediastinal and hilar lymph nodes progressive since prior study. This could be inflammatory/reactive or neoplastic.4. Numerous borderline enlarged bilateral axillary and supraclavicular lymph nodes, slightly more pronounced. 5. Advanced atherosclerotic calcifications involving the aorta andcoronary arteries.6. Aortic atherosclerosis.  Micro Data:   Respiratory PCR panel: Negative Antimicrobials:  Ceftriaxone 5/26 Azithromycin 5/26 Zosyn started 5/26 Interim history/subjective:  Feels a little better  Objective   Blood pressure (Abnormal) 121/54, pulse 70, temperature 98 F (36.7 C), temperature source Oral, resp. rate (Abnormal) 22, height 5\' 10"  (1.778  m), weight 95 kg, SpO2 98 %.    FiO2 (%):  [100 %] 100 %   Intake/Output Summary (Last 24 hours) at 10/17/2019 1037 Last data filed at 10/17/2019 0654 Gross per 24 hour  Intake 908.33 ml  Output 825 ml  Net 83.33 ml   Filed Weights   10/15/2019 1240 10/17/19 0232  Weight: 96 kg 95 kg    Examination:  General this is a pleasant 86 year old male. He is sitting at bed side. Not in distress but does require high flow oxygen still HENT NCAT no JVD MMM pulm wheezes and rhonchi on left, significantly reduced on right. CT in place posteriorly. No air leak appreciated. Minimal pleural fluid out as of yet. Still on 90% high flow  Card regular irreg abd not tender Ext warm and dry  Neuro hard of hearing but intact.   Resolved Hospital Problem list     Assessment & Plan:  Acute hypoxic respiratory failure in the setting of right lower lobe pneumonia (aspiration vs possibly post-obstructive) with loculated right pleural effusion; can't exclude underlying malignancy  Has known history of chronic aspiration PCXR w/ on-going right sided airspace disease and element of effusion (loculated) CT appears kinked  Plan Cont chest tube drainage planned at least daily and ideally twice daily TPA/DNase dwells. May need to pull chest tube out some to reposition if doesn't drain much w/ first TPA/DNase attempt Cont zosyn day 2 Aspiration precautions Wean oxygen    Severe sepsis (POA) secondary to pneumonia, no organism specified to date WBC count chronically elevated in setting of CLL Plan abx as above   Chronic atrial fibrillation, on anticoagulation Plan Cont rate control  Coumadin induced coagulopathy.  Down to 1.8 from  INR 7.2 No evidence of bleeding Plan Cont to hold ac for now   AKI in  setting of sepsis Plan Cont IVFs Am chemistry   HFrEF, currently appears to be compensated Plan Cont home meds Tele   Mild hyperkalemia Plan Per primary   History of CLL, currently on  surveillance Plan Supportive care per heme   Best practice:  Diet: reg Pain/Anxiety/Delirium protocol (if indicated): na VAP protocol (if indicated): na DVT prophylaxis: holding  GI prophylaxis: na Glucose control: ssi Mobility: BR Code Status: dnr Family Communication: wife updated  Disposition: admit    Erick Colace ACNP-BC [place] Pager # [phone] OR # [phone] if no answer

## 2019-10-17 NOTE — Progress Notes (Signed)
Discussed w/ AC pt condition & admission. Was recommended to admit pt to unit & consult rapid response upon arrival. Pt arrived to unit - o2 sats in high 80's on 15L HFNC & 15 NRB with respiratory rate in 40's - respiratory & rapid called shortly after arrival. M. Sharlet Salina on call hospitalist paged to update on pt status.

## 2019-10-17 NOTE — Significant Event (Signed)
Not a Rapid Response Event- RN asked for 2nd assessment  Overview: Tachypnea and High oxygen requirement  Initial Focused Assessment: I was notified by nursing staff of patient recently admitted from ED with tachypnea (30-35) and high oxygen requirement 15L HFNC and 15L NRB mask. Oscar Baker is admitted with multiple medical problems most importantly aspiration pneumonia with pleural effusion/empyema. Upon arrival, Oscar Baker was awake, oriented x4 and able to speak in short phrases. He is tachypneic RR 30-35 with some abdominal accessory muscle use. He does endorse some SOB, however he states he feels a lot better than he did an couple of hours ago. BBS Coarse Rhonchi with rales and diminished BS on Right. Reportedly a productive cough. Skin is pink, cool and clammy with pitting edema.   0330- 99.3 F rectal, HR 68, 144/62(82), RR 38-40 with sats 94-95% on 30L HHFNC at 100% FiO2 with some reduction of abdominal accessory muscle use.    Interventions: -Add HHFNC with NRB due to increased WOB  Plan of Care (if not transferred): -Maintain aspiration precautions and HOB >30 degrees -Frequent pulmonary toileting as tolerated, TCDB, IS and flutter valve  Event Summary: Call received 0213 Arrived at call 0235 Call ended 0330  Madelynn Done

## 2019-10-17 NOTE — Progress Notes (Signed)
Pharmacy Antibiotic Note  Oscar Baker is a 86 y.o. male admitted on 10/15/2019 with pneumonia.  Pharmacy has been consulted for Vancomycin dosing. Patient is also receiving Zosyn. Patient noted to have CLL with baseline WBC ~ 20-30K. WBC currently elevated to 70K. SCr elevated at 1.30 (baseline 0.8 -1). Currently afebrile. R-sided pleuritic pain. CT Chest with R-sided PNA and question of R-sided lung mass.   Plan: Vancomycin 2g IV x1 then, 1250 mg IV every 24 hours.  Continue Zosyn at 3.375g IV every 8 hours- 4hr infusion. Monitor renal function, culture results, and clinical status.   Height: 5\' 10"  (177.8 cm) Weight: 95 kg (209 lb 7 oz) IBW/kg (Calculated) : 73  Temp (24hrs), Avg:98.2 F (36.8 C), Min:97.4 F (36.3 C), Max:99.3 F (37.4 C)  Recent Labs  Lab 09/27/2019 1255 10/01/2019 1305 10/15/2019 1456 10/17/19 0240  WBC  --  56.6*  --  70.3*  CREATININE  --  1.33*  --  1.30*  LATICACIDVEN 2.9*  --  2.2*  --     Estimated Creatinine Clearance: 48.1 mL/min (A) (by C-G formula based on SCr of 1.3 mg/dL (H)).    Allergies  Allergen Reactions  . Altace [Ramipril] Other (See Comments)    "throat felt like had a knot in it"  . Augmentin [Amoxicillin-Pot Clavulanate] Diarrhea    severe  . Codeine Nausea And Vomiting    Nausea and vomiting   . Simvastatin Other (See Comments)    Leg aches    Antimicrobials this admission: Azith 4/26 x1 CTX 4/26 x1 Zosyn 4/26 >> Vanc 4/27 >>  Dose adjustments this admission:   Microbiology results: 4/27 Pleural fluid >> 4/27 MRSA PCR >> 4/26 BCx >> 4/26 COVID/Flu negative  Thank you for allowing pharmacy to be a part of this patient's care.  Sloan Leiter, PharmD, BCPS, BCCCP Clinical Pharmacist Please refer to [facility] for [place] numbers 10/17/2019 7:49 AM

## 2019-10-17 NOTE — Progress Notes (Signed)
Patient admitted to unit with increased work of breathing, coarse crackles lung sounds, +3-+4 pitting edema in bilateral lower extremities and on NRB at 15L, tachypneic with respiratory rate between 35-42 bpm. Patient was diaphoretic with hands and feet, Rectal temp 99.49F.   Charge RN, Amy called Rapid Response on the patent and Respiratory therapy was also called to the bedside.  When RRT and RT arrived at bedside, patient was on 15L HFNC and NRB with O2 sats hanging around 85-88%.  Allen,RT placed patient on Optiflow at 30L 100% and David,RN RRT assessed patient and paged the oncall hospitalist for suggestions.

## 2019-10-17 NOTE — Consult Note (Addendum)
Cardiology Consultation:   Patient ID: LAMOYNE HESSEL; [id number]; July 17, 1932   Admit date: 10/10/2019 Date of Consult: 10/17/2019  Primary Care Provider: [name], MD Primary Cardiologist: Mertie Moores, MD 06/29/2019 Primary Electrophysiologist:  Cristopher Peru, MD 08/15/2019   Patient Profile:   RAGAN REALE is a 86 y.o. male with a hx of perm Afib/flutter, SSS s/p Biotronik PPM 2015, DM, HTN, HLD, S-CHF w/ EF 30-35%, CLL,  abnl MV 2019 (med rx), who is being seen today for the evaluation of CHF at the request of Dr Florene Glen.  History of Present Illness:   Mr. Gomer went to see his PCP 04/26 for increasing cough and shortness of breath.  He is not sure about lower extremity edema.  He has significant dyspnea on exertion.  He has orthopnea as well as PND.   Room, his O2 saturation was 80%.  He was started on high flow nasal cannula, but his oxygen saturation was difficult to maintain on that.  At 10 L/min nonrebreather was started and his O2 saturation was 85%.  His respiratory rate was in the 40s at times.  He was placed on OptiFlow at 30 L 400% O2.  His O2 saturation increased to greater than 90%.  He was felt to have some volume overload by exam and was given Lasix 20 mg IV.  He had thoracentesis with the chest tube insertion on the right, that only about 75 cc was removed.  Once his INR was subtherapeutic, TPA/DNase was inserted.    Cough is productive of greenish-yellow sputum.  His home inhalers were not helping.  His weight has been stable or down a little.  He was felt to have acute hypoxic respiratory failure due to aspiration pneumonia, complicated by right loculated parapneumonic effusion versus empyema.  Critical Care team was consulted for consideration of chest tube.  His INR was severely elevated at 7.2 and was reversed.  A chest tube was indicated, it was inserted once his INR was reversed.  His initial white count was 56,000, but actually went up to 70K overnight.   Normal white count is 20,000-30,000.  Oncology who felt that his white count elevation was reactive related to current infection, not transformation of his CLL.  There was no need for IVIG.  Supportive care was recommended.  His chest CT showed a dense masslike consolidation in the right lower lobe with occlusion of the bronchus intermedius, suspicious for neoplasm.  There was also a large right pleural effusion.  Lymph nodes were enlarged as well.  Mr Bidinger is still extremely SOB. He is awake and alert, responds to questions as he is able, but cannot speak more than a few words at a time.   He denies CP.  SOB is a little better on the high-flow O2.    Past Medical History:  Diagnosis Date  . Asthma 1950's   history of  . Atrial fibrillation ([place])   . Bilateral carpal tunnel syndrome   . Bilateral lower extremity edema   . Bladder tumor   . Chronic systolic heart failure (HCC)    Echo 1/19: Mild LVH, EF 45-50, inf HK, MAC, severe LAE, severe RAE // Echo 7/15: Mild LVH, mod focal basal sept hypertrophy, EF 55-60, AV peak and mean 16/9, trivial MR, mod LAE, PASP 38  . CLL (chronic lymphocytic leukemia) [facility]) oncologist-  dr Ilene Qua--   dx [phone] ;  Lymphocytosis, CLL - per lov note 05-11-2017 currently under active survillance,  CT 04-17-2014 show very small  lymphadenopathy, no indication for treatment  . Coronary artery disease    cardiologist-  dr Cathie Olden--  08-18-2017 Intermittant risk nuclear study w/ large area of inferior infartion with no evidence ishcemia   . 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 Diabetes mellitus type 2, noninsulin dependent ([place])   . Elevated PSA    since prostatectomy but now resolved  . Hematuria 04/2017  . History of ear infection    Right  . History of MI (myocardial infarction)    per myoview nuclear study 08-18-2017 , unknown when  . History of shingles 08/2017   L ear and scalp, possible  . Hyperlipidemia   . Hypertension   . Ischemic cardiomyopathy 09/01/2017    Presumed +CAD with Nuclear stress test 08/18/17 - Inferior scar, no ischemia, intermediate risk // med management unless +angina or worse dyspnea  . OA (osteoarthritis)   . Pacemaker 02/08/2014   followed by dr g. taylor--  single chamber Biotronik due to SSS  . Permanent atrial fibrillation ([place])   . Pneumonia 2019   Left lung  . Prostate cancer [facility]) urologist-  dr Diona Fanti   dx 2004--  Gleason 8, PSA 10.45--  11-28-2002  s/p  radical prostatectomy;  recurrent w/ increasing PSA, started ADT treatment  . RBBB (right bundle branch block)   . Sick sinus syndrome ([place])    a-Flutter with episodes of bradycardia; S/P Biotronik (serial number [id number]) 02-08-2014  . Urinary incontinence   . Wears hearing aid in right ear    receiver and transmitter    Past Surgical History:  Procedure Laterality Date  . APPENDECTOMY    . BACK SURGERY     disk  . CARDIAC CATHETERIZATION  09-03-1999  dr Cathie Olden   abnormal cardiolite study:  minor luminal irregularities but no critial coronary artery stenosis  . CARDIOVASCULAR STRESS TEST  08-18-2017  dr Cathie Olden   Intermediate risk nuclear study w/ large area inferior infarction, no evidence of ishcemia (consistant w/ prior MI)/  study not gated due to frequent PVCs  . CARPAL TUNNEL RELEASE Right 2000  . CARPAL TUNNEL RELEASE Left 11/19/2009  . CATARACT EXTRACTION Right 07/2015  . CATARACT EXTRACTION Left 09/2015  . CYSTOSCOPY N/A 11/04/2017   Procedure: CYSTOSCOPY AND CAUTERIZATION OF BLADDER;  Surgeon: Franchot Gallo, MD;  Location: [facility];  Service: Urology;  Laterality: N/A;  . INSERTION PENILE PROSTHESIS  02-22-2004    dr Mattie Marlin  [facility]  . IR THORACENTESIS ASP PLEURAL SPACE W/IMG GUIDE  11/26/2017  . KNEE ARTHROSCOPY Left 07/2010  . ORIF PERIPROSTHETIC FRACTURE Right 03/15/2019   Procedure: OPEN REDUCTION INTERNAL FIXATION (ORIF) PERIPROSTHETIC FRACTURE WITH ZIMMER CABLES;  Surgeon: Paralee Cancel, MD;  Location: [place];  Service:  Orthopedics;  Laterality: Right;  . PERMANENT PACEMAKER INSERTION N/A 02/08/2014   Procedure: PERMANENT PACEMAKER INSERTION;  Surgeon: Evans Lance, MD;  Location: [facility] CATH LAB;  Service: Cardiovascular;  Laterality: N/A;  . PLEURAL EFFUSION DRAINAGE Left 11/30/2017   Procedure: DRAINAGE OF HEMOTHORAX;  Surgeon: Ivin Poot, MD;  Location: [place];  Service: Thoracic;  Laterality: Left;  . RADICAL RETROPUBIC PROSTATECTOMY W/ BILATERAL PELVIC LYMPH NODE DISSECTION  11-28-2002   dr Mattie Marlin  [facility]  . TONSILLECTOMY    . TOTAL HIP ARTHROPLASTY Left 05/12/2016   Procedure: LEFT TOTAL HIP ARTHROPLASTY ANTERIOR APPROACH;  Surgeon: Paralee Cancel, MD;  Location: [facility];  Service: Orthopedics;  Laterality: Left;  . TOTAL HIP ARTHROPLASTY Right 07-15-2006   dr Alvan Dame  [facility]  . TRANSTHORACIC ECHOCARDIOGRAM  07/20/2017   mild LVH, ef 45-50%, hypokinesis of the basal-midinferior myocardium, due to AFib unable to evaluate diastolic function/  severe LAE and RAE/  trivial PR and TR  . VIDEO ASSISTED THORACOSCOPY Left 11/30/2017   Procedure: VIDEO ASSISTED THORACOSCOPY;  Surgeon: Ivin Poot, MD;  Location: [place];  Service: Thoracic;  Laterality: Left;  [name] VIDEO ASSISTED THORACOSCOPY (VATS)/EMPYEMA Left 11/29/2017   Procedure: VIDEO ASSISTED THORACOSCOPY (VATS)/EMPYEMA;  Surgeon: Ivin Poot, MD;  Location: [place];  Service: Thoracic;  Laterality: Left;     Prior to Admission medications   Medication Sig Start Date End Date Taking? Authorizing Provider  acetaminophen (TYLENOL) 325 MG tablet Take 2 tablets (650 mg total) by mouth every 4 (four) hours as needed for mild pain. 04/03/19  Yes Love, Ivan Anchors, PA-C  alendronate (FOSAMAX) 70 MG tablet Take 70 mg by mouth once a week. 07/12/19  Yes [provider]  atorvastatin (LIPITOR) 20 MG tablet TAKE ONE TABLET DAILY AT 6PM Patient taking differently: Take 20 mg by mouth daily.  09/06/19  Yes Khamryn Calderone, Wonda Cheng, MD  carvedilol (COREG) 3.125 MG tablet TAKE  ONE TABLET TWICE DAILY Patient taking differently: Take 3.125 mg by mouth in the morning and at bedtime.  09/07/18  Yes Evans Lance, MD  ciprofloxacin (CILOXAN) 0.3 % ophthalmic solution Place 4 drops into both eyes 2 (two) times daily.  07/24/19  Yes [provider]  fenofibrate micronized (LOFIBRA) 200 MG capsule TAKE ONE CAPSULE EACH DAY BEFORE BREAKFAST Patient taking differently: Take 200 mg by mouth daily.  07/12/19  Yes Earsel Shouse, Wonda Cheng, MD  furosemide (LASIX) 20 MG tablet Take 1 tablet (20 mg total) by mouth daily. 04/11/19  Yes Love, Ivan Anchors, PA-C  glipiZIDE (GLUCOTROL XL) 2.5 MG 24 hr tablet Take 2.5 mg by mouth. Take 1 tablet daily if blood glucose >140   Yes [provider]  guaiFENesin (MUCINEX) 600 MG 12 hr tablet Take 600 mg by mouth 2 (two) times daily as needed for cough.    Yes [provider]  HYDROcodone-homatropine (HYCODAN) 5-1.5 MG/5ML syrup Take 5 mLs by mouth 2 times daily at 12 noon and 4 pm. As needed   Yes [provider]  hydrocortisone 2.5 % cream Apply 1 application topically 2 (two) times daily as needed (for itching). to affected area 02/01/19  Yes [provider]  ipratropium (ATROVENT) 0.02 % nebulizer solution TAKE 2.37m BY NEBULIZER 3 TIMES DAILY Patient taking differently: Take 0.25 mg by nebulization See admin instructions. Takes 2 to 3 times daily as needed for wheezing or shortness of breath. 03/01/19  Yes Olalere, Adewale A, MD  ketoconazole (NIZORAL) 2 % shampoo Apply 1 application topically See admin instructions. 2 to 3 times a week. 01/09/19  Yes [provider]  metFORMIN (GLUCOPHAGE) 1000 MG tablet Take 1,000 mg by mouth every morning.   Yes [provider]  metFORMIN (GLUCOPHAGE) 850 MG tablet Take 850 mg by mouth at bedtime.   Yes [provider]  mometasone (ELOCON) 0.1 % cream Apply 1 application topically daily as needed (for itching).  07/24/19  Yes [provider]    Multiple Vitamin (MULTIVITAMIN WITH MINERALS) TABS tablet Take 1 tablet by mouth daily.   Yes [provider]  traMADol (ULTRAM) 50 MG tablet Take 1 tablet (50 mg total) by mouth every 12 (twelve) hours as needed for severe pain. 04/11/19  Yes Love, PIvan Anchors PA-C  vitamin  B-12 (CYANOCOBALAMIN) 1000 MCG tablet Take 1,000 mcg by mouth daily.    Yes [provider]  warfarin (COUMADIN) 5 MG tablet TAKE AS DIRECTED BY COUMADIN CLINIC Patient taking differently: Take 5 mg by mouth daily.  10/12/19  Yes Christiane Sistare, Wonda Cheng, MD  gabapentin (NEURONTIN) 300 MG capsule TAKE ONE CAPSULE TWICE A DAY Patient not taking: Reported on 10/15/2019 06/19/19   Laurin Coder, MD  [facility] ULTRA test strip 1 each by Other route daily. 06/12/19   [provider]    Inpatient Medications: Scheduled Meds: . alteplase (TPA) for intrapleural administration  10 mg Intrapleural Q12H   And  . pulmozyme (DORNASE) for intrapleural administration  5 mg Intrapleural Q12H  . atorvastatin  20 mg Oral Daily  . fenofibrate  160 mg Oral Daily  . gabapentin  100 mg Oral BID  . hydrocortisone cream   Topical BID  . insulin aspart  0-9 Units Subcutaneous TID WC  . insulin detemir  7 Units Subcutaneous BID  . ipratropium  0.5 mg Nebulization Q4H  . lactobacillus acidophilus  2 tablet Oral TID  . multivitamin with minerals  1 tablet Oral Daily  . prednisoLONE acetate  4 drop Both Eyes BID  . vitamin B-12  1,000 mcg Oral Daily   Continuous Infusions: . piperacillin-tazobactam (ZOSYN)  IV 3.375 g (10/17/19 0945)  . [START ON 10-23-2019] vancomycin     PRN Meds: acetaminophen **OR** acetaminophen, guaiFENesin, hydrALAZINE, HYDROcodone-acetaminophen, melatonin, ondansetron **OR** ondansetron (ZOFRAN) IV  Allergies:    Allergies  Allergen Reactions  . Altace [Ramipril] Other (See Comments)    "throat felt like had a knot in it"  . Augmentin [Amoxicillin-Pot Clavulanate] Diarrhea    severe  .  Codeine Nausea And Vomiting    Nausea and vomiting   . Simvastatin Other (See Comments)    Leg aches    Social History:   Social History   Socioeconomic History  . Marital status: Married    Spouse name: Not on file  . Number of children: Not on file  . Years of education: Not on file  . Highest education level: Not on file  Occupational History  . Not on file  Tobacco Use  . Smoking status: Former Smoker    Years: 25.00    Types: Pipe, Cigars    Quit date: 11/25/1975    Years since quitting: 43.9  . Smokeless tobacco: Never Used  Substance and Sexual Activity  . Alcohol use: Yes    Alcohol/week: 0.0 standard drinks    Comment: occasional  . Drug use: No  . Sexual activity: Not on file  Other Topics Concern  . Not on file  Social History Narrative  . Not on file   Social Determinants of Health   Financial Resource Strain:   . Difficulty of Paying Living Expenses:   Food Insecurity:   . Worried About Charity fundraiser in the Last Year:   . Arboriculturist in the Last Year:   Transportation Needs:   . Film/video editor (Medical):   [name] Lack of Transportation (Non-Medical):   Physical Activity:   . Days of Exercise per Week:   . Minutes of Exercise per Session:   Stress:   . Feeling of Stress :   Social Connections:   . Frequency of Communication with Friends and Family:   . Frequency of Social Gatherings with Friends and Family:   . Attends Religious Services:   . Active Member of Clubs  or Organizations:   . Attends Archivist Meetings:   [name] Marital Status:   Intimate Partner Violence:   . Fear of Current or Ex-Partner:   . Emotionally Abused:   [name] Physically Abused:   . Sexually Abused:     Family History:   Family History  Problem Relation Age of Onset  . Emphysema Mother   . Allergic rhinitis Mother   . Heart attack Father   . Allergic rhinitis Brother   . Pulmonary fibrosis Brother    Family Status:  Family Status  Relation Name  Status  . Mother  Deceased at age 52  . Father  Deceased at age 62       MI  . Brother  (Not Specified)       PTCA  . MGM  Deceased  . MGF  Deceased  . PGM  Deceased  . PGF  Deceased    ROS:  Please see the history of present illness.  All other ROS reviewed and negative.     Physical Exam/Data:   Vitals:   10/17/19 0820 10/17/19 0823 10/17/19 1139 10/17/19 1556  BP:  (!) 121/54    Pulse: 67 70 64 64  Resp: (!) 28 (!) 22 (!) 35 (!) 33  Temp:      TempSrc:      SpO2: 96% 98% 99% 98%  Weight:      Height:        Intake/Output Summary (Last 24 hours) at 10/17/2019 1615 Last data filed at 10/17/2019 1532 Gross per 24 hour  Intake 2550 ml  Output 700 ml  Net 1850 ml    Last 3 Weights 10/17/2019 09/21/2019 08/15/2019  Weight (lbs) 209 lb 7 oz 211 lb 10.3 oz 211 lb 9.6 oz  Weight (kg) 95 kg 96 kg 95.981 kg     Body mass index is 30.05 kg/m.   General:  Well nourished, well developed, male in no acute distress HEENT: normal Lymph: no adenopathy Neck: JVD - 10 cm Endocrine:  No thryomegaly Vascular: No carotid bruits; 4/4 extremity pulses 2+  Cardiac:  normal S1, S2; Irreg R&R; no murmur Lungs: rales and rhonchi bilaterally, no wheezing  Abd: soft, + tender, no hepatomegaly  Ext: 1+ LE edema Musculoskeletal:  No deformities, BUE and BLE strength weak but equal Skin: warm and dry  Neuro:  CNs 2-12 intact, no focal abnormalities noted Psych:  Normal affect   EKG:  The EKG was personally reviewed and demonstrates:  04/27 ECG is atrial flutter, HR 67, some paced beats Telemetry:  Telemetry was personally reviewed and demonstrates:  Atrial fib/flutter w/ V pacing prn   CV studies:   ECHO: 10/17/2019 1. Akinesis of the distal septum and apex; overall moderate to severe LV  dysfunction; mild LVH; biatrial enlargement; mild RVE with severe RV  dysfunction; mild AS (AVA 1.8 cm2 by continuity equation).  2. Left ventricular ejection fraction, by estimation, is 30 to  35%. The  left ventricle has moderate to severely decreased function. The left  ventricle demonstrates regional wall motion abnormalities (see scoring  diagram/findings for description). There  is mild left ventricular hypertrophy. Left ventricular diastolic function  could not be evaluated.  3. Right ventricular systolic function is severely reduced. The right  ventricular size is mildly enlarged. There is moderately elevated  pulmonary artery systolic pressure.  4. Left atrial size was moderately dilated.  5. Right atrial size was severely dilated.  6. The mitral valve is normal in structure.  Trivial mitral valve  regurgitation. No evidence of mitral stenosis.  7. The aortic valve is tricuspid. Aortic valve regurgitation is not  visualized. Mild aortic valve stenosis.  8. The inferior vena cava is normal in size with greater than 50%  respiratory variability, suggesting right atrial pressure of 3 mmHg.   MYOVIEW: 08/18/2017   There was no ST segment deviation noted during stress.  Defect 1: There is a large defect of severe severity present in the basal inferoseptal, basal inferior, basal inferolateral, mid inferoseptal, mid inferior, mid inferolateral, apical septal, apical inferior, apical lateral and apex location.  Findings consistent with prior myocardial infarction.  This is an intermediate risk study. Large area of inferior infarction. No ischemia.  EF not calculated (frequent PVC's noted by tech). Recent ECHO 45% EF    Laboratory Data:   Chemistry Recent Labs  Lab 10/04/2019 1305 09/28/2019 1305 10/13/2019 1337 10/17/19 0240 10/17/19 0809  NA 134*  --  134* 133*  --   K 5.2*   < > 5.3* 6.6* 5.1  CL 101  --   --  99  --   CO2 21*  --   --  25  --   GLUCOSE 297*  --   --  337*  --   BUN 36*  --   --  44*  --   CREATININE 1.33*  --   --  1.30*  --   CALCIUM 8.8*  --   --  8.8*  --   GFRNONAA 48*  --   --  50*  --   GFRAA 56*  --   --  58*  --   ANIONGAP 12   --   --  9  --    < > = values in this interval not displayed.    Lab Results  Component Value Date   ALT 15 10/09/2019   AST 25 10/13/2019   ALKPHOS 80 09/21/2019   BILITOT 1.3 (H) 10/05/2019   Hematology Recent Labs  Lab 10/08/2019 1305 10/04/2019 1337 10/17/19 0240  WBC 56.6*  --  70.3*  RBC 3.76*  --  3.73*  HGB 12.5* 13.6 12.3*  HCT 37.9* 40.0 37.9*  MCV 100.8*  --  101.6*  MCH 33.2  --  33.0  MCHC 33.0  --  32.5  RDW 18.4*  --  18.6*  PLT 391  --  428*   Cardiac Enzymes High Sensitivity Troponin:  No results for input(s): TROPONINIHS in the last 720 hours.    BNP Recent Labs  Lab 09/29/2019 1255  BNP 423.8*    DDimer No results for input(s): DDIMER in the last 168 hours. TSH:  Lab Results  Component Value Date   TSH 0.436 07/22/2017   Lipids: Lab Results  Component Value Date   CHOL 145 05/28/2015   HDL 23 (L) 05/28/2015   LDLCALC 101 05/28/2015   LDLDIRECT 133.8 07/13/2012   TRIG 107 05/28/2015   CHOLHDL 6.3 (H) 05/28/2015   HgbA1c: Lab Results  Component Value Date   HGBA1C 6.9 (H) 10/04/2019   Magnesium:  Magnesium  Date Value Ref Range Status  11/22/2017 1.9 1.7 - 2.4 mg/dL Final    Comment:    Performed at [facility], [place] [address] [place], [place] [address]   Lab Results  Component Value Date   INR 1.8 (H) 10/17/2019   INR 7.2 (HH) 10/07/2019   INR 2.7 09/14/2019      Radiology/Studies:  CT CHEST WO CONTRAST  Result Date: 09/22/2019 CLINICAL DATA:  Shortness of breath. EXAM: CT CHEST WITHOUT CONTRAST TECHNIQUE: Multidetector CT imaging of the chest was performed following the standard protocol without IV contrast. COMPARISON:  Chest CT 11/23/2017 and chest x-ray from today. FINDINGS: Cardiovascular: The heart is mildly enlarged. No pericardial effusion. There is tortuosity, ectasia and calcification of the thoracic aorta. Extensive coronary artery calcifications are noted. Mediastinum/Nodes: Enlarged mediastinal and hilar  lymph nodes progressive since prior study. This could be inflammatory/reactive. 11 mm right paratracheal nodes on image number 52/3. 21 mm subcarinal lymph node on image number 73/3. The esophagus is grossly normal. Lungs/Pleura: There is a large loculated right pleural fluid collection along with fluid in the major and minor fissures. This measures as simple fluid. I do not think this is pleural tumor. Empyema is possible. Dense masslike area of consolidation in the right infrahilar region. Could not exclude neoplasm with obstructive atelectasis in the right lower lobe and right middle lobe. T the bronchus intermedius is markedly compressed and occluded distally. The left lung is relatively clear. Some streaky areas of atelectasis. Upper Abdomen: No significant upper abdominal findings. No hepatic or adrenal gland lesions are identified. Advanced aortic calcifications. Musculoskeletal: Numerous borderline enlarged bilateral axillary and supraclavicular lymph nodes. These were also present on the prior CT scan but are slightly more pronounced. No significant bony findings. Advanced degenerative changes involving the spine. IMPRESSION: 1. Dense masslike area of consolidation in the right infrahilar region with marked compression and occlusion of the bronchus intermedius. Findings very suspicious for neoplasm with obstructive atelectasis in the right lower lobe and right middle lobe. Bronchoscopy may be helpful for further evaluation. 2. Large right sided loculated pleural fluid no obvious pleural mass. 3. Enlarged mediastinal and hilar lymph nodes progressive since prior study. This could be inflammatory/reactive or neoplastic. 4. Numerous borderline enlarged bilateral axillary and supraclavicular lymph nodes, slightly more pronounced. 5. Advanced atherosclerotic calcifications involving the aorta and coronary arteries. 6. Aortic atherosclerosis. Aortic Atherosclerosis (ICD10-I70.0). Aortic Atherosclerosis  (ICD10-I70.0). Electronically Signed   By: Marijo Sanes M.D.   On: 09/28/2019 15:33   DG CHEST PORT 1 VIEW  Result Date: 10/17/2019 CLINICAL DATA:  Chest tube placement, shortness of breath, sore chest. EXAM: PORTABLE CHEST 1 VIEW COMPARISON:  Chest CT 09/25/2019, chest radiograph 09/24/2019 FINDINGS: There has been interval placement of a right-sided chest tube. Two sites of kinking are noted within the chest tube at the level of the mid to lower right chest (see annotations on image). A trace right apical pneumothorax has developed since prior chest CT 10/14/2019. Persistent at least moderate sized right pleural effusion which is likely loculated given findings on chest CT performed 10/19/2019. A masslike area of consolidation within the right infrahilar region was better appreciated on this prior study. Right middle and lower lobe atelectasis have progressed. The left lung remains clear. Unchanged cardiomediastinal silhouette. Aortic atherosclerosis. Redemonstrated left chest pacer device. There is a incompletely assessed lesion within the proximal left humerus with associated chondroid matrix. Chondroid matrix extends beyond the bony margins. These results will be called to the ordering clinician or representative by the Radiologist Assistant, and communication documented in the PACS or [employer]. IMPRESSION: Interval placement of a right-sided chest tube. There are two sites of kinking within the chest tube at the level of the mid to lower chest. A trace right apical pneumothorax is now present. Persistent at least moderate right pleural effusion which is likely loculated given findings on chest CT performed 10/20/2019. A  masslike area of consolidation suspicious for neoplasm within the right infrahilar region was better appreciated on this prior exam. Right middle and lower lobe atelectasis have progressed. Incompletely assessed lesion within the proximal left humerus with associated chondroid  matrix. 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 Dedicated radiographs of the left humerus are recommended for initial further evaluation. Electronically Signed   By: Kellie Simmering DO   On: 10/17/2019 08:50   DG Chest Port 1 View  Result Date: 09/27/2019 CLINICAL DATA:  Short of breath, cough, hypoxia EXAM: PORTABLE CHEST 1 VIEW COMPARISON:  04/09/2019 FINDINGS: Single frontal view of the chest demonstrates stable single lead pacemaker. Cardiac silhouette is unchanged. There is new veiling opacity at the right lung base consistent with consolidation and effusion. No pneumothorax. IMPRESSION: 1. Right basilar consolidation and effusion, compatible with pneumonia. Electronically Signed   By: Randa Ngo M.D.   On: 10/02/2019 13:17   ECHOCARDIOGRAM COMPLETE  Result Date: 10/17/2019    ECHOCARDIOGRAM REPORT   Patient Name:   CRISTON CHANCELLOR Date of Exam: 10/17/2019 Medical Rec #:  [id number]      Height:       70.0 in Accession #:    [id number]     Weight:       209.4 lb Date of Birth:  12-Sep-1932      BSA:          2.129 m Patient Age:    3 years       BP:           121/54 mmHg Patient Gender: M              HR:           70 bpm. Exam Location:  Inpatient Procedure: 2D Echo, Cardiac Doppler and Color Doppler Indications:    Chest Pain 786.50/R07.9  History:        Patient has prior history of Echocardiogram examinations, most                 recent 07/20/2017. CHF, CAD, Arrythmias:Atrial Fibrillation; Risk                 Factors:Hypertension, Dyslipidemia and Diabetes. Symptomatic                 bradycardia - s/p Biotronik (serial number [id number]). Ischemic                 cardiomyopathy.  Sonographer:    Clayton Lefort RDCS (AE) Referring Phys: [id number] [place]  1. Akinesis of the distal septum and apex; overall moderate to severe LV dysfunction; mild LVH; biatrial enlargement; mild RVE with severe RV dysfunction; mild AS  (AVA 1.8 cm2 by continuity equation).  2. Left ventricular ejection fraction, by estimation, is 30 to 35%. The left ventricle has moderate to severely decreased function. The left ventricle demonstrates regional wall motion abnormalities (see scoring diagram/findings for description). There is mild left ventricular hypertrophy. Left ventricular diastolic function could not be evaluated.  3. Right ventricular systolic function is severely reduced. The right ventricular size is mildly enlarged. There is moderately elevated pulmonary artery systolic pressure.  4. Left atrial size was moderately dilated.  5. Right atrial size was severely dilated.  6. The mitral valve is normal in structure. Trivial mitral valve regurgitation. No evidence of mitral stenosis.  7. The aortic valve is tricuspid. Aortic valve regurgitation is not visualized. Mild aortic valve stenosis.  8. The inferior vena cava is normal in size with greater than 50% respiratory variability, suggesting right atrial pressure of 3 mmHg. FINDINGS  Left Ventricle: Left ventricular ejection fraction, by estimation, is 30 to 35%. The left ventricle has moderate to severely decreased function. The left ventricle demonstrates regional wall motion abnormalities. The left ventricular internal cavity size was normal in size. There is mild left ventricular hypertrophy. Left ventricular diastolic function could not be evaluated due to atrial fibrillation. Left ventricular diastolic function could not be evaluated. Right Ventricle: The right ventricular size is mildly enlarged.Right ventricular systolic function is severely reduced. There is moderately elevated pulmonary artery systolic pressure. The tricuspid regurgitant velocity is 2.91 m/s, and with an assumed right atrial pressure of 15 mmHg, the estimated right ventricular systolic pressure is 88.4 mmHg. Left Atrium: Left atrial size was moderately dilated. Right Atrium: Right atrial size was severely dilated.  Pericardium: Trivial pericardial effusion is present. Mitral Valve: The mitral valve is normal in structure. There is mild calcification of the mitral valve leaflet(s). Normal mobility of the mitral valve leaflets. Trivial mitral valve regurgitation. No evidence of mitral valve stenosis. Tricuspid Valve: The tricuspid valve is normal in structure. Tricuspid valve regurgitation is trivial. No evidence of tricuspid stenosis. Aortic Valve: The aortic valve is tricuspid. Aortic valve regurgitation is not visualized. Mild aortic stenosis is present. Aortic valve mean gradient measures 7.0 mmHg. Aortic valve peak gradient measures 12.3 mmHg. Aortic valve area, by VTI measures 1.81 cm. Pulmonic Valve: The pulmonic valve was not well visualized. Pulmonic valve regurgitation is not visualized. No evidence of pulmonic stenosis. Aorta: The aortic root is normal in size and structure. Venous: The inferior vena cava is normal in size with greater than 50% respiratory variability, suggesting right atrial pressure of 3 mmHg. IAS/Shunts: No atrial level shunt detected by color flow Doppler. Additional Comments: Akinesis of the distal septum and apex; overall moderate to severe LV dysfunction; mild LVH; biatrial enlargement; mild RVE with severe RV dysfunction; mild AS (AVA 1.8 cm2 by continuity equation). A pacer wire is visualized.  LEFT VENTRICLE PLAX 2D LVIDd:         5.09 cm LVIDs:         4.35 cm LV PW:         1.57 cm LV IVS:        1.69 cm LVOT diam:     2.30 cm LV SV:         59 LV SV Index:   28 LVOT Area:     4.15 cm  LV Volumes (MOD) LV vol d, MOD A2C: 162.0 ml LV vol d, MOD A4C: 139.0 ml LV vol s, MOD A2C: 105.0 ml LV vol s, MOD A4C: 94.5 ml LV SV MOD A2C:     57.0 ml LV SV MOD A4C:     139.0 ml LV SV MOD BP:      50.8 ml RIGHT VENTRICLE             IVC RV Basal diam:  4.80 cm     IVC diam: 2.12 cm RV Mid diam:    4.90 cm RV S prime:     12.30 cm/s TAPSE (M-mode): 1.2 cm LEFT ATRIUM              Index       RIGHT  ATRIUM           Index LA diam:        4.40 cm  2.07  cm/m  RA Area:     36.50 cm LA Vol (A2C):   104.0 ml 48.86 ml/m RA Volume:   152.00 ml 71.41 ml/m LA Vol (A4C):   85.4 ml  40.12 ml/m LA Biplane Vol: 95.2 ml  44.72 ml/m  AORTIC VALVE AV Area (Vmax):    1.89 cm AV Area (Vmean):   1.70 cm AV Area (VTI):     1.81 cm AV Vmax:           175.20 cm/s AV Vmean:          124.200 cm/s AV VTI:            0.328 m AV Peak Grad:      12.3 mmHg AV Mean Grad:      7.0 mmHg LVOT Vmax:         79.52 cm/s LVOT Vmean:        50.940 cm/s LVOT VTI:          0.143 m LVOT/AV VTI ratio: 0.44  AORTA Ao Root diam: 3.50 cm Ao Asc diam:  3.70 cm TRICUSPID VALVE TR Peak grad:   33.9 mmHg TR Vmax:        291.00 cm/s  SHUNTS Systemic VTI:  0.14 m Systemic Diam: 2.30 cm Kirk Ruths MD Electronically signed by Kirk Ruths MD Signature Date/Time: 10/17/2019/12:13:41 PM    Final     Assessment and Plan:   1. 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  - He got Lasix 20 mg IV early this am, repeat per MD.  Active Problems:   Community acquired pneumonia of right lower lobe of lung   Sepsis ([place])   Supratherapeutic INR     For questions or updates, please contact [facility] Please consult [email or web address] for contact info under Cardiology/STEMI.   Signed, Rosaria Ferries, PA-C  10/17/2019 4:15 PM   Attending Note:   The patient was seen and examined.  Agree with assessment and plan as noted above.  Changes made to the above note as needed.  Patient seen and independently examined with  Rosaria Ferries, PA .   We discussed all aspects of the  encounter. I agree with the assessment and plan as stated above.  1.   Hypoxemia : Jaryn has had progressive hypoxemia and respiratory failure .  He had a very complex right pleural empyema , lung mass, ? Cancer, ? Aspiration. He is significant congestion with extensive rhonchi bilaterally ,  R >> L .  He may have some slight contribution from his CHF but this is minor compared to his pulmonary issues.  Chest tube is in place.  Hopefully he will turn around quickly    He received lasix,  Will repeat lasix tonight   2.  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   :   EF has further decreased since previous echo .   He may have some volume excess and will will diurese as best we can with lasix .    3.   Atrial fib:  On coumadin,  Reversed in the ER .  Hold further anticoagulation for now  He is very weak and congested. His prognosis is poor .   He is a DNR     I have spent a total of 40 minutes with patient reviewing hospital  notes , telemetry, EKGs, labs and examining patient as well as establishing an assessment and plan that was discussed with the patient. > 50% of time was spent in direct patient care.    Thayer Headings, Brooke Bonito., MD, [facility] 10/17/2019, 5:18 PM [address],  [place] Pager [phone]

## 2019-10-17 NOTE — Care Plan (Signed)
TPA & DNase instilled in right chest tube with bedside RNs during handoff. They will release meds in 1 hour.  Fentanyl 17mcg given for pain related to chest tube.  His wife was at bedside and was concerned about his overall prognosis. I agree that his condition is serious and he may not be able to recover fully from such a significant infection. I remain concerned that he will have repeat pulmonary infections in the future. She reiterated that he has a DNR and that he has been through so much with his health. I reassured her that if I think he is actively dying we will prioritize making him comfortable and ensuring that she can be with him.  Julian Hy, DO 10/17/19 7:35 PM [facility]

## 2019-10-17 NOTE — Progress Notes (Signed)
Events noted in the last 24 hours.  Oscar Baker is known to me with history of CLL and prostate cancer although neither of those conditions are currently active or contributing to his illness.  His white cell count elevation is reactive related to his current infection and pneumonia without any suggestion of transformation of his CLL.  His immunoglobulins are intact without any need for IVIG.  I recommend continued supportive management per the primary team and the pulmonary team without any additional treatment needed from an oncology standpoint.  His right lung mass could be a neoplasm versus infectious which will be reevaluated upon his recovery from this current hospitalization. Possibly repeat imaging studies and biopsy may be needed if this mass persisted after his infection has been treated.  No further recommendation for the time being.  Please call with any questions regarding this pleasant gentleman.

## 2019-10-18 ENCOUNTER — Inpatient Hospital Stay (HOSPITAL_COMMUNITY): Payer: Medicare Other

## 2019-10-18 DIAGNOSIS — I5023 Acute on chronic systolic (congestive) heart failure: Secondary | ICD-10-CM

## 2019-10-18 DIAGNOSIS — R5381 Other malaise: Secondary | ICD-10-CM

## 2019-10-18 DIAGNOSIS — I2510 Atherosclerotic heart disease of native coronary artery without angina pectoris: Secondary | ICD-10-CM

## 2019-10-18 DIAGNOSIS — J9601 Acute respiratory failure with hypoxia: Secondary | ICD-10-CM | POA: Diagnosis not present

## 2019-10-18 DIAGNOSIS — D72824 Basophilia: Secondary | ICD-10-CM

## 2019-10-18 DIAGNOSIS — Z515 Encounter for palliative care: Secondary | ICD-10-CM

## 2019-10-18 DIAGNOSIS — A419 Sepsis, unspecified organism: Secondary | ICD-10-CM | POA: Diagnosis not present

## 2019-10-18 DIAGNOSIS — J9600 Acute respiratory failure, unspecified whether with hypoxia or hypercapnia: Secondary | ICD-10-CM

## 2019-10-18 DIAGNOSIS — Z66 Do not resuscitate: Secondary | ICD-10-CM

## 2019-10-18 DIAGNOSIS — I4821 Permanent atrial fibrillation: Secondary | ICD-10-CM

## 2019-10-18 DIAGNOSIS — Z789 Other specified health status: Secondary | ICD-10-CM

## 2019-10-18 DIAGNOSIS — R791 Abnormal coagulation profile: Secondary | ICD-10-CM

## 2019-10-18 DIAGNOSIS — J189 Pneumonia, unspecified organism: Secondary | ICD-10-CM | POA: Diagnosis not present

## 2019-10-18 LAB — CBC WITH DIFFERENTIAL/PLATELET
Abs Immature Granulocytes: 1.25 10*3/uL — ABNORMAL HIGH (ref 0.00–0.07)
Basophils Absolute: 0 10*3/uL (ref 0.0–0.1)
Basophils Relative: 0 %
Eosinophils Absolute: 0 10*3/uL (ref 0.0–0.5)
Eosinophils Relative: 0 %
HCT: 38.5 % — ABNORMAL LOW (ref 39.0–52.0)
Hemoglobin: 12.8 g/dL — ABNORMAL LOW (ref 13.0–17.0)
Immature Granulocytes: 2 %
Lymphocytes Relative: 60 %
Lymphs Abs: 39 10*3/uL — ABNORMAL HIGH (ref 0.7–4.0)
MCH: 32.8 pg (ref 26.0–34.0)
MCHC: 33.2 g/dL (ref 30.0–36.0)
MCV: 98.7 fL (ref 80.0–100.0)
Monocytes Absolute: 3 10*3/uL — ABNORMAL HIGH (ref 0.1–1.0)
Monocytes Relative: 5 %
Neutro Abs: 21.2 10*3/uL — ABNORMAL HIGH (ref 1.7–7.7)
Neutrophils Relative %: 33 %
Platelets: 433 10*3/uL — ABNORMAL HIGH (ref 150–400)
RBC: 3.9 MIL/uL — ABNORMAL LOW (ref 4.22–5.81)
RDW: 18.7 % — ABNORMAL HIGH (ref 11.5–15.5)
WBC Morphology: ABNORMAL
WBC: 64.4 10*3/uL (ref 4.0–10.5)
nRBC: 0.1 % (ref 0.0–0.2)

## 2019-10-18 LAB — NA AND K (SODIUM & POTASSIUM), RAND UR
Potassium Urine: 40 mmol/L
Sodium, Ur: 20 mmol/L

## 2019-10-18 LAB — COMPREHENSIVE METABOLIC PANEL
ALT: 14 U/L (ref 0–44)
AST: 23 U/L (ref 15–41)
Albumin: 2.3 g/dL — ABNORMAL LOW (ref 3.5–5.0)
Alkaline Phosphatase: 68 U/L (ref 38–126)
Anion gap: 16 — ABNORMAL HIGH (ref 5–15)
BUN: 51 mg/dL — ABNORMAL HIGH (ref 8–23)
CO2: 18 mmol/L — ABNORMAL LOW (ref 22–32)
Calcium: 8.3 mg/dL — ABNORMAL LOW (ref 8.9–10.3)
Chloride: 101 mmol/L (ref 98–111)
Creatinine, Ser: 1.38 mg/dL — ABNORMAL HIGH (ref 0.61–1.24)
GFR calc Af Amer: 54 mL/min — ABNORMAL LOW (ref >60)
GFR calc non Af Amer: 46 mL/min — ABNORMAL LOW (ref >60)
Glucose, Bld: 242 mg/dL — ABNORMAL HIGH (ref 70–99)
Potassium: 5.2 mmol/L — ABNORMAL HIGH (ref 3.5–5.1)
Sodium: 135 mmol/L (ref 135–145)
Total Bilirubin: 1.3 mg/dL — ABNORMAL HIGH (ref 0.3–1.2)
Total Protein: 5.7 g/dL — ABNORMAL LOW (ref 6.5–8.1)

## 2019-10-18 LAB — PATHOLOGIST SMEAR REVIEW

## 2019-10-18 LAB — MAGNESIUM: Magnesium: 2 mg/dL (ref 1.7–2.4)

## 2019-10-18 LAB — PHOSPHORUS: Phosphorus: 4.1 mg/dL (ref 2.5–4.6)

## 2019-10-18 LAB — GLUCOSE, CAPILLARY
Glucose-Capillary: 263 mg/dL — ABNORMAL HIGH (ref 70–99)
Glucose-Capillary: 260 mg/dL — ABNORMAL HIGH (ref 70–99)
Glucose-Capillary: 208 mg/dL — ABNORMAL HIGH (ref 70–99)

## 2019-10-18 LAB — PROTIME-INR
INR: 1.6 — ABNORMAL HIGH (ref 0.8–1.2)
Prothrombin Time: 18.3 seconds — ABNORMAL HIGH (ref 11.4–15.2)

## 2019-10-18 MED ORDER — MORPHINE 100MG IN NS 100ML (1MG/ML) PREMIX INFUSION
1.0000 mg/h | INTRAVENOUS | Status: DC
  Administered 2019-10-18: 1 mg/h via INTRAVENOUS
  Filled 2019-10-18: qty 100

## 2019-10-18 MED ORDER — LORAZEPAM 1 MG PO TABS: 1.0000 mg | ORAL_TABLET | ORAL | Status: DC | PRN

## 2019-10-18 MED ORDER — GLYCOPYRROLATE 0.2 MG/ML IJ SOLN
0.2000 mg | INTRAMUSCULAR | Status: DC | PRN
  Filled 2019-10-18: qty 1

## 2019-10-18 MED ORDER — LORAZEPAM 2 MG/ML PO CONC: 1.0000 mg | ORAL | Status: DC | PRN

## 2019-10-18 MED ORDER — HALOPERIDOL LACTATE 5 MG/ML IJ SOLN: 0.5000 mg | INTRAMUSCULAR | Status: DC | PRN

## 2019-10-18 MED ORDER — SODIUM CHLORIDE 0.9% FLUSH: 10.0000 mL | INTRAVENOUS | Status: DC | PRN

## 2019-10-18 MED ORDER — HALOPERIDOL 0.5 MG PO TABS: 0.5000 mg | ORAL_TABLET | ORAL | Status: DC | PRN

## 2019-10-18 MED ORDER — POLYVINYL ALCOHOL 1.4 % OP SOLN: 1.0000 [drp] | Freq: Four times a day (QID) | OPHTHALMIC | Status: DC | PRN

## 2019-10-18 MED ORDER — LORAZEPAM 2 MG/ML IJ SOLN: 1.0000 mg | INTRAMUSCULAR | Status: DC | PRN

## 2019-10-18 MED ORDER — GLYCOPYRROLATE 1 MG PO TABS: 1.0000 mg | ORAL_TABLET | ORAL | Status: DC | PRN

## 2019-10-18 MED ORDER — SODIUM CHLORIDE 0.9% FLUSH
10.0000 mL | Freq: Two times a day (BID) | INTRAVENOUS | Status: DC
  Administered 2019-10-18: 10 mL

## 2019-10-18 MED ORDER — MORPHINE SULFATE (PF) 2 MG/ML IV SOLN
2.0000 mg | INTRAVENOUS | Status: DC | PRN
  Administered 2019-10-18: 2 mg via INTRAVENOUS
  Filled 2019-10-18: qty 1

## 2019-10-18 MED ORDER — GLYCOPYRROLATE 0.2 MG/ML IJ SOLN: 0.2000 mg | INTRAMUSCULAR | Status: DC | PRN

## 2019-10-18 MED ORDER — IPRATROPIUM BROMIDE 0.02 % IN SOLN
0.5000 mg | Freq: Four times a day (QID) | RESPIRATORY_TRACT | Status: DC
  Administered 2019-10-18 (×2): 0.5 mg via RESPIRATORY_TRACT
  Filled 2019-10-18 (×2): qty 2.5

## 2019-10-18 MED ORDER — HALOPERIDOL LACTATE 2 MG/ML PO CONC
0.5000 mg | ORAL | Status: DC | PRN
  Filled 2019-10-18: qty 0.3

## 2019-10-18 MED ORDER — FUROSEMIDE 10 MG/ML IJ SOLN
40.0000 mg | Freq: Once | INTRAMUSCULAR | Status: AC
  Administered 2019-10-18: 40 mg via INTRAVENOUS
  Filled 2019-10-18: qty 4

## 2019-10-18 MED ORDER — SODIUM CHLORIDE 3 % IN NEBU
4.0000 mL | INHALATION_SOLUTION | Freq: Every day | RESPIRATORY_TRACT | Status: DC
  Administered 2019-10-18: 4 mL via RESPIRATORY_TRACT
  Filled 2019-10-18: qty 4

## 2019-10-19 LAB — IGG, IGA, IGM
IgA: 99 mg/dL (ref 61–437)
IgG (Immunoglobin G), Serum: 771 mg/dL (ref 603–1613)
IgM (Immunoglobulin M), Srm: 108 mg/dL (ref 15–143)

## 2019-10-19 LAB — CYTOLOGY - NON PAP

## 2019-10-20 LAB — BODY FLUID CULTURE: Culture: NO GROWTH

## 2019-10-21 LAB — CULTURE, BLOOD (ROUTINE X 2)
Culture: NO GROWTH
Culture: NO GROWTH
Special Requests: ADEQUATE
Special Requests: ADEQUATE

## 2019-10-21 NOTE — Progress Notes (Signed)
Patient's saturation has been dropping to the high 80's while asleep on the [facility].  :Patient is breathing with his mouth open.  Liter flow increased to 40 with very little improvement in saturation.  NRB initiated during times of sleep to assist with mouth breathing.  Saturation 92% and greater.  Patient was awaken and encouraged to cough to clear his airway.

## 2019-10-21 NOTE — Progress Notes (Signed)
Did no tolerate HT saline neb.  Remains labored. Minimally responsive.  Do not think he will survive this.   I spoke to his wife Opal Sidles. At this point I think we will do more for him therapeutically by letting family see him than continuing the current treatment plan that is failing him.   Plan Transition to full comfort.  Liberalize family visiting as much as system will allow PRN morphine Cont current oxygen but don't titrate up or down Stop all meds and therapies that do not directly contribute to his comfort.   I will notify the primary service of this change in plan  Erick Colace ACNP-BC [place] Pager # [phone] OR # [phone] if no answer

## 2019-10-21 NOTE — Death Summary Note (Signed)
Death Summary  Oscar Baker [id number] DOB: 24-Jan-1933 DOA: 25-Oct-2019  PCP: [name], MD  Admit date: Oct 25, 2019 Date of Death: 10/27/2019 Time of Death: 09/21/1945 Notification: [name], MD notified of death of 10-27-2019   History of present illness:  Oscar Baker is a 86 y.o. male with a history of CLL on remission, CAD, borderline systolic CHF chronic, A. fib on Coumadin,IIDM,HTN, HLD, SSS status post PPM, presented with increasing short of breath dry cough and right-sided pleuritic chest pain for 3 days. Symptoms started suddenly 3 days ago, cough was dry at the beginning and then became productive of greenish sputum, denied any fever or chills, chest pain started 2 days ago, sharp-like located on the right sided rib cage, worsening with cough and movement. Before this, patient had a few days of loose bowel movement, but denied any nauseous vomiting or abdominal pain. And diarrhea subsided few days ago.  He was admitted with acute hypoxic respiratory failure and severe sepsis secondary to pneumonia with a loculated effusion, DNR at time of admission. He had mild AKI on admission with hyperkalemia and was in acute on chronic systolic CHF. Pulmonology and oncology following.  He underwent thoracocentesis with removal of exudative fluid by protein. He was treated with broad spectrum antibiotics.  Fluid cultures negative.  Had chest tube placed on 4/27.  Started on TPA with DNase.  However, patient continued to have respiratory distress requiring higher level of supplemental oxygen.  Eventually, he was transitioned to comfort care by PCCM on 10/27/22 and passed peacefully that evening.   Final Diagnoses:  1.   Acute hypoxic respiratory failure  2.   Severe sepsis secondary to right-sided pneumonia with pleural effusion  3.   Acute kidney injury  4.   CLL  5.   CAD 6.   IDDM 7.   Acute on chronic systolic CHF  8.   Atrial fibrillation    The results of significant diagnostics  from this hospitalization (including imaging, microbiology, ancillary and laboratory) are listed below for reference.    Significant Diagnostic Studies: CT CHEST WO CONTRAST  Result Date: 10-25-19 CLINICAL DATA:  Shortness of breath. EXAM: CT CHEST WITHOUT CONTRAST TECHNIQUE: Multidetector CT imaging of the chest was performed following the standard protocol without IV contrast. COMPARISON:  Chest CT 11/23/2017 and chest x-ray from today. FINDINGS: Cardiovascular: The heart is mildly enlarged. No pericardial effusion. There is tortuosity, ectasia and calcification of the thoracic aorta. Extensive coronary artery calcifications are noted. Mediastinum/Nodes: Enlarged mediastinal and hilar lymph nodes progressive since prior study. This could be inflammatory/reactive. 11 mm right paratracheal nodes on image number 52/3. 21 mm subcarinal lymph node on image number 73/3. The esophagus is grossly normal. Lungs/Pleura: There is a large loculated right pleural fluid collection along with fluid in the major and minor fissures. This measures as simple fluid. I do not think this is pleural tumor. Empyema is possible. Dense masslike area of consolidation in the right infrahilar region. Could not exclude neoplasm with obstructive atelectasis in the right lower lobe and right middle lobe. T the bronchus intermedius is markedly compressed and occluded distally. The left lung is relatively clear. Some streaky areas of atelectasis. Upper Abdomen: No significant upper abdominal findings. No hepatic or adrenal gland lesions are identified. Advanced aortic calcifications. Musculoskeletal: Numerous borderline enlarged bilateral axillary and supraclavicular lymph nodes. These were also present on the prior CT scan but are slightly more pronounced. No significant bony findings. Advanced degenerative changes involving the spine. IMPRESSION:  1. Dense masslike area of consolidation in the right infrahilar region with marked  compression and occlusion of the bronchus intermedius. Findings very suspicious for neoplasm with obstructive atelectasis in the right lower lobe and right middle lobe. Bronchoscopy may be helpful for further evaluation. 2. Large right sided loculated pleural fluid no obvious pleural mass. 3. Enlarged mediastinal and hilar lymph nodes progressive since prior study. This could be inflammatory/reactive or neoplastic. 4. Numerous borderline enlarged bilateral axillary and supraclavicular lymph nodes, slightly more pronounced. 5. Advanced atherosclerotic calcifications involving the aorta and coronary arteries. 6. Aortic atherosclerosis. Aortic Atherosclerosis (ICD10-I70.0). Aortic Atherosclerosis (ICD10-I70.0). Electronically Signed   By: Marijo Sanes M.D.   On: 09/29/2019 15:33   DG Chest Port 1 View  Result Date: Nov 07, 2019 CLINICAL DATA:  Respiratory failure EXAM: PORTABLE CHEST 1 VIEW COMPARISON:  October 17, 2019 chest radiograph and October 16, 2019 chest CT FINDINGS: Chest tube again noted on the right with apparent kinks in the chest tube in the lower right hemithorax region, similar to 1 day prior. The previously noted pneumothorax on the right is not evident currently. There is currently diffuse opacification of the right lung, likely due to combination of consolidation and pleural effusion. Left lung is clear. Heart is borderline enlarged with pulmonary vascularity on the left normal and pulmonary vascularity on the right obscured. No adenopathy evident in areas that can be assessed. There is aortic atherosclerosis. There is a sclerotic lesion in the proximal left humerus, stable. IMPRESSION: 1. Diffuse consolidation right lung, likely due to combination of consolidation and pleural effusion. An underlying mass cannot be excluded in the right lung region. There is concern for right perihilar mass on recent CT. 2.  Left lung clear. 3. Stable cardiac prominence. Pacemaker lead attached to right ventricle. 4.  Sclerotic area in the proximal left humerus with chondroid matrix. Question bone infarct or enchondroma. Chondrosarcoma potentially could present in this manner as was noted on recent radiographic report. Dedicated left humerus radiographs advised in this regard. Electronically Signed   By: Lowella Grip III M.D.   On: 07-Nov-2019 08:01   DG CHEST PORT 1 VIEW  Result Date: 10/17/2019 CLINICAL DATA:  Chest tube placement, shortness of breath, sore chest. EXAM: PORTABLE CHEST 1 VIEW COMPARISON:  Chest CT 09/26/2019, chest radiograph 10/10/2019 FINDINGS: There has been interval placement of a right-sided chest tube. Two sites of kinking are noted within the chest tube at the level of the mid to lower right chest (see annotations on image). A trace right apical pneumothorax has developed since prior chest CT 09/23/2019. Persistent at least moderate sized right pleural effusion which is likely loculated given findings on chest CT performed 10/12/2019. A masslike area of consolidation within the right infrahilar region was better appreciated on this prior study. Right middle and lower lobe atelectasis have progressed. The left lung remains clear. Unchanged cardiomediastinal silhouette. Aortic atherosclerosis. Redemonstrated left chest pacer device. There is a incompletely assessed lesion within the proximal left humerus with associated chondroid matrix. Chondroid matrix extends beyond the bony margins. These results will be called to the ordering clinician or representative by the Radiologist Assistant, and communication documented in the PACS or [employer]. IMPRESSION: Interval placement of a right-sided chest tube. There are two sites of kinking within the chest tube at the level of the mid to lower chest. A trace right apical pneumothorax is now present. Persistent at least moderate right pleural effusion which is likely loculated given findings on chest CT performed 10/20/2019. A  masslike area of  consolidation suspicious for neoplasm within the right infrahilar region was better appreciated on this prior exam. Right middle and lower lobe atelectasis have progressed. Incompletely assessed lesion within the proximal left humerus with associated chondroid matrix. 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 Dedicated radiographs of the left humerus are recommended for initial further evaluation. Electronically Signed   By: Kellie Simmering DO   On: 10/17/2019 08:50   DG Chest Port 1 View  Result Date: 10/12/2019 CLINICAL DATA:  Short of breath, cough, hypoxia EXAM: PORTABLE CHEST 1 VIEW COMPARISON:  04/09/2019 FINDINGS: Single frontal view of the chest demonstrates stable single lead pacemaker. Cardiac silhouette is unchanged. There is new veiling opacity at the right lung base consistent with consolidation and effusion. No pneumothorax. IMPRESSION: 1. Right basilar consolidation and effusion, compatible with pneumonia. Electronically Signed   By: Randa Ngo M.D.   On: 09/26/2019 13:17   ECHOCARDIOGRAM COMPLETE  Result Date: 10/17/2019    ECHOCARDIOGRAM REPORT   Patient Name:   Oscar Baker Date of Exam: 10/17/2019 Medical Rec #:  [id number]      Height:       70.0 in Accession #:    [id number]     Weight:       209.4 lb Date of Birth:  Nov 14, 1932      BSA:          2.129 m Patient Age:    89 years       BP:           121/54 mmHg Patient Gender: M              HR:           70 bpm. Exam Location:  Inpatient Procedure: 2D Echo, Cardiac Doppler and Color Doppler Indications:    Chest Pain 786.50/R07.9  History:        Patient has prior history of Echocardiogram examinations, most                 recent 07/20/2017. CHF, CAD, Arrythmias:Atrial Fibrillation; Risk                 Factors:Hypertension, Dyslipidemia and Diabetes. Symptomatic                 bradycardia - s/p Biotronik (serial number [id number]). Ischemic                  cardiomyopathy.  Sonographer:    Clayton Lefort RDCS (AE) Referring Phys: [id number] [place]  1. Akinesis of the distal septum and apex; overall moderate to severe LV dysfunction; mild LVH; biatrial enlargement; mild RVE with severe RV dysfunction; mild AS (AVA 1.8 cm2 by continuity equation).  2. Left ventricular ejection fraction, by estimation, is 30 to 35%. The left ventricle has moderate to severely decreased function. The left ventricle demonstrates regional wall motion abnormalities (see scoring diagram/findings for description). There is mild left ventricular hypertrophy. Left ventricular diastolic function could not be evaluated.  3. Right ventricular systolic function is severely reduced. The right ventricular size is mildly enlarged. There is moderately elevated pulmonary artery systolic pressure.  4. Left atrial size was moderately dilated.  5. Right atrial size was severely dilated.  6. The mitral valve is normal in structure. Trivial mitral valve regurgitation. No evidence of mitral stenosis.  7. The aortic valve is tricuspid. Aortic valve regurgitation is not visualized. Mild aortic valve stenosis.  8. The inferior vena cava is normal in size with greater than 50% respiratory variability, suggesting right atrial pressure of 3 mmHg. FINDINGS  Left Ventricle: Left ventricular ejection fraction, by estimation, is 30 to 35%. The left ventricle has moderate to severely decreased function. The left ventricle demonstrates regional wall motion abnormalities. The left ventricular internal cavity size was normal in size. There is mild left ventricular hypertrophy. Left ventricular diastolic function could not be evaluated due to atrial fibrillation. Left ventricular diastolic function could not be evaluated. Right Ventricle: The right ventricular size is mildly enlarged.Right ventricular systolic function is severely reduced. There is moderately elevated pulmonary artery systolic pressure. The  tricuspid regurgitant velocity is 2.91 m/s, and with an assumed right atrial pressure of 15 mmHg, the estimated right ventricular systolic pressure is [id number] mmHg. Left Atrium: Left atrial size was moderately dilated. Right Atrium: Right atrial size was severely dilated. Pericardium: Trivial pericardial effusion is present. Mitral Valve: The mitral valve is normal in structure. There is mild calcification of the mitral valve leaflet(s). Normal mobility of the mitral valve leaflets. Trivial mitral valve regurgitation. No evidence of mitral valve stenosis. Tricuspid Valve: The tricuspid valve is normal in structure. Tricuspid valve regurgitation is trivial. No evidence of tricuspid stenosis. Aortic Valve: The aortic valve is tricuspid. Aortic valve regurgitation is not visualized. Mild aortic stenosis is present. Aortic valve mean gradient measures 7.0 mmHg. Aortic valve peak gradient measures 12.3 mmHg. Aortic valve area, by VTI measures 1.81 cm. Pulmonic Valve: The pulmonic valve was not well visualized. Pulmonic valve regurgitation is not visualized. No evidence of pulmonic stenosis. Aorta: The aortic root is normal in size and structure. Venous: The inferior vena cava is normal in size with greater than 50% respiratory variability, suggesting right atrial pressure of 3 mmHg. IAS/Shunts: No atrial level shunt detected by color flow Doppler. Additional Comments: Akinesis of the distal septum and apex; overall moderate to severe LV dysfunction; mild LVH; biatrial enlargement; mild RVE with severe RV dysfunction; mild AS (AVA 1.8 cm2 by continuity equation). A pacer wire is visualized.  LEFT VENTRICLE PLAX 2D LVIDd:         5.09 cm LVIDs:         4.35 cm LV PW:         1.57 cm LV IVS:        1.69 cm LVOT diam:     2.30 cm LV SV:         59 LV SV Index:   28 LVOT Area:     4.15 cm  LV Volumes (MOD) LV vol d, MOD A2C: 162.0 ml LV vol d, MOD A4C: 139.0 ml LV vol s, MOD A2C: 105.0 ml LV vol s, MOD A4C: 94.5 ml LV SV MOD  A2C:     57.0 ml LV SV MOD A4C:     139.0 ml LV SV MOD BP:      50.8 ml RIGHT VENTRICLE             IVC RV Basal diam:  4.80 cm     IVC diam: 2.12 cm RV Mid diam:    4.90 cm RV S prime:     12.30 cm/s TAPSE (M-mode): 1.2 cm LEFT ATRIUM              Index       RIGHT ATRIUM           Index LA diam:        4.40 cm  2.07  cm/m  RA Area:     36.50 cm LA Vol (A2C):   104.0 ml 48.86 ml/m RA Volume:   152.00 ml 71.41 ml/m LA Vol (A4C):   85.4 ml  40.12 ml/m LA Biplane Vol: 95.2 ml  44.72 ml/m  AORTIC VALVE AV Area (Vmax):    1.89 cm AV Area (Vmean):   1.70 cm AV Area (VTI):     1.81 cm AV Vmax:           175.20 cm/s AV Vmean:          124.200 cm/s AV VTI:            0.328 m AV Peak Grad:      12.3 mmHg AV Mean Grad:      7.0 mmHg LVOT Vmax:         79.52 cm/s LVOT Vmean:        50.940 cm/s LVOT VTI:          0.143 m LVOT/AV VTI ratio: 0.44  AORTA Ao Root diam: 3.50 cm Ao Asc diam:  3.70 cm TRICUSPID VALVE TR Peak grad:   33.9 mmHg TR Vmax:        291.00 cm/s  SHUNTS Systemic VTI:  0.14 m Systemic Diam: 2.30 cm Kirk Ruths MD Electronically signed by Kirk Ruths MD Signature Date/Time: 10/17/2019/12:13:41 PM    Final    CUP PACEART REMOTE DEVICE CHECK  Result Date: 10/09/2019 Scheduled remote reviewed. Normal device function.  Next remote 91 days. Felisa Bonier, RN, MSN   Microbiology: Recent Results (from the past 240 hour(s))  Blood Culture (routine x 2)     Status: None (Preliminary result)   Collection Time: 10/13/2019 12:55 PM   Specimen: BLOOD  Result Value Ref Range Status   Specimen Description BLOOD RIGHT ANTECUBITAL  Final   Special Requests   Final    BOTTLES DRAWN AEROBIC AND ANAEROBIC Blood Culture adequate volume   Culture   Final    NO GROWTH 2 DAYS Performed at [facility], [address], [place], [place] [address]    Report Status PENDING  Incomplete  Respiratory Panel by RT PCR (Flu A&B, Covid) - Nasopharyngeal Swab     Status: None   Collection Time: 10/05/2019  2:11 PM    Specimen: Nasopharyngeal Swab  Result Value Ref Range Status   SARS Coronavirus 2 by RT PCR NEGATIVE NEGATIVE Final    Comment: (NOTE) SARS-CoV-2 target nucleic acids are NOT DETECTED. The SARS-CoV-2 RNA is generally detectable in upper respiratoy specimens during the acute phase of infection. The lowest concentration of SARS-CoV-2 viral copies this assay can detect is 131 copies/mL. A negative result does not preclude SARS-Cov-2 infection and should not be used as the sole basis for treatment or other patient management decisions. A negative result may occur with  improper specimen collection/handling, submission of specimen other than nasopharyngeal swab, presence of viral mutation(s) within the areas targeted by this assay, and inadequate number of viral copies (<131 copies/mL). A negative result must be combined with clinical observations, patient history, and epidemiological information. The expected result is Negative. Fact Sheet for Patients:  PinkCheek.be Fact Sheet for Healthcare Providers:  GravelBags.it This test is not yet ap proved or cleared by the [place] FDA and  has been authorized for detection and/or diagnosis of SARS-CoV-2 by FDA under an Emergency Use Authorization (EUA). This EUA will remain  in effect (meaning this test can be used) for the duration of the COVID-19  declaration under Section 564(b)(1) of the Act, 21 U.S.C. section 360bbb-3(b)(1), unless the authorization is terminated or revoked sooner.    Influenza A by PCR NEGATIVE NEGATIVE Final   Influenza B by PCR NEGATIVE NEGATIVE Final    Comment: (NOTE) The Xpert Xpress SARS-CoV-2/FLU/RSV assay is intended as an aid in  the diagnosis of influenza from Nasopharyngeal swab specimens and  should not be used as a sole basis for treatment. Nasal washings and  aspirates are unacceptable for Xpert Xpress SARS-CoV-2/FLU/RSV  testing. Fact Sheet  for Patients: PinkCheek.be Fact Sheet for Healthcare Providers: GravelBags.it This test is not yet approved or cleared by the [place] FDA and  has been authorized for detection and/or diagnosis of SARS-CoV-2 by  FDA under an Emergency Use Authorization (EUA). This EUA will remain  in effect (meaning this test can be used) for the duration of the  Covid-19 declaration under Section 564(b)(1) of the Act, 21  U.S.C. section 360bbb-3(b)(1), unless the authorization is  terminated or revoked. Performed at [facility], [place] [address] [place], [place] [address]   Blood Culture (routine x 2)     Status: None (Preliminary result)   Collection Time: 10/01/2019  2:58 PM   Specimen: BLOOD  Result Value Ref Range Status   Specimen Description BLOOD BLOOD RIGHT FOREARM  Final   Special Requests   Final    BOTTLES DRAWN AEROBIC AND ANAEROBIC Blood Culture adequate volume   Culture   Final    NO GROWTH 2 DAYS Performed at [facility], [place] [address], [place], [place] [address]    Report Status PENDING  Incomplete  Body fluid culture (includes gram stain)     Status: None (Preliminary result)   Collection Time: 10/17/19  8:22 AM   Specimen: Pleural Fluid  Result Value Ref Range Status   Specimen Description FLUID  Final   Special Requests PLEURAL  Final   Gram Stain   Final    MODERATE WBC PRESENT, PREDOMINANTLY PMN NO ORGANISMS SEEN    Culture   Final    NO GROWTH < 24 HOURS Performed at [facility], [place] [address], [place], [place] [address]    Report Status PENDING  Incomplete     Labs: Basic Metabolic Panel: Recent Labs  Lab 10/04/2019 1305 10/14/2019 1305 10/07/2019 1337 09/21/2019 1337 10/17/19 0240 10/17/19 0240 10/17/19 0809 10-30-19 0145  NA 134*  --  134*  --  133*  --   --  135  K 5.2*   < > 5.3*   < > 6.6*   < > 5.1 5.2*  CL 101  --   --   --  99  --   --  101  CO2 21*  --   --   --  25  --    --  18*  GLUCOSE 297*  --   --   --  337*  --   --  242*  BUN 36*  --   --   --  44*  --   --  51*  CREATININE 1.33*  --   --   --  1.30*  --   --  1.38*  CALCIUM 8.8*  --   --   --  8.8*  --   --  8.3*  MG  --   --   --   --   --   --   --  2.0  PHOS  --   --   --   --   --   --   --  4.1   < > = values in this interval not displayed.   Liver Function Tests: Recent Labs  Lab 10/12/2019 1305 2019-11-03 0145  AST 25 23  ALT 15 14  ALKPHOS 80 68  BILITOT 1.3* 1.3*  PROT 6.8 5.7*  ALBUMIN 2.9* 2.3*   No results for input(s): LIPASE, AMYLASE in the last 168 hours. No results for input(s): AMMONIA in the last 168 hours. CBC: Recent Labs  Lab 10/15/2019 1305 10/15/2019 1337 10/17/19 0240 11-03-19 0145  WBC 56.6*  --  70.3* 64.4*  NEUTROABS 21.5*  --   --  21.2*  HGB 12.5* 13.6 12.3* 12.8*  HCT 37.9* 40.0 37.9* 38.5*  MCV 100.8*  --  101.6* 98.7  PLT 391  --  428* 433*   Cardiac Enzymes: No results for input(s): CKTOTAL, CKMB, CKMBINDEX, TROPONINI in the last 168 hours. D-Dimer No results for input(s): DDIMER in the last 72 hours. BNP: Invalid input(s): POCBNP CBG: Recent Labs  Lab 10/17/19 1713 10/17/19 2105 Nov 03, 2019 0833 Nov 03, 2019 1141 Nov 03, 2019 1554  GLUCAP 179* 227* 263* 260* 208*   Anemia work up No results for input(s): VITAMINB12, FOLATE, FERRITIN, TIBC, IRON, RETICCTPCT in the last 72 hours. Urinalysis    Component Value Date/Time   COLORURINE YELLOW 10/17/2019 2320   APPEARANCEUR CLOUDY (A) 10/17/2019 2320   LABSPEC 1.019 10/17/2019 2320   PHURINE 5.0 10/17/2019 2320   GLUCOSEU 50 (A) 10/17/2019 2320   HGBUR NEGATIVE 10/17/2019 2320   BILIRUBINUR NEGATIVE 10/17/2019 2320   KETONESUR NEGATIVE 10/17/2019 2320   PROTEINUR NEGATIVE 10/17/2019 2320   NITRITE NEGATIVE 10/17/2019 2320   LEUKOCYTESUR NEGATIVE 10/17/2019 2320   Sepsis Labs Invalid input(s): PROCALCITONIN,  WBC,  LACTICIDVEN     SIGNED:  Vianne Bulls, MD  [facility] Nov 03, 2019,  8:38 PM Pager   If 7PM-7AM, please contact night-coverage [email or web address] Password [email or web address]

## 2019-10-21 NOTE — Progress Notes (Signed)
Called provider on call for pt's increased oxygen requirement and coarse crackles. Order's rec'd.

## 2019-10-21 NOTE — Progress Notes (Signed)
NAME:  Oscar Baker, MRN:  [id number], DOB:  July 13, 1932, LOS: 2 ADMISSION DATE:  10/08/2019, CONSULTATION DATE:  4/26 REFERRING MD:  zhang, CHIEF COMPLAINT:  Respiratory failure w/ pleural effusion    Brief History   86 year old male admitted 4/26 with acute hypoxic respiratory failure, working diagnosis is pneumonia and pleural effusion   Past Medical History  CLL, on surveillance, congestive heart failure, HFrEF, atrial fibrillation on Coumadin coronary artery disease, diabetes type 2, elevated PSA, , Hypertension, prior empyema, on left, chronic cough with chronic aspiration Significant Hospital Events   426 admitted; abx started. Given Vit K and coumadin held. INR 7.8 4/27 chest tube placed TPA/DNase (first administered at 1030am) 4/28 > 2 liters from chest tube since TPA/DNase but mucous plugging and collapsed right hemithorax. Much weaker. Appearing terminally ill.  Consults:  Pulmonary consulted for pleural effusion  Procedures:    Significant Diagnostic Tests:  CT chest 4/26:  1. Dense masslike area of consolidation in the right infrahilar region with marked compression and occlusion of the bronchus intermedius. Findings very suspicious for neoplasm with obstructive atelectasis in the right lower lobe and right middle lobe. Bronchoscopy may be helpful for further evaluation. 2. Large right sided loculated pleural fluid no obvious pleural mass.3. Enlarged mediastinal and hilar lymph nodes progressive since prior study. This could be inflammatory/reactive or neoplastic.4. Numerous borderline enlarged bilateral axillary and supraclavicular lymph nodes, slightly more pronounced. 5. Advanced atherosclerotic calcifications involving the aorta andcoronary arteries.6. Aortic atherosclerosis.  Micro Data:   Respiratory PCR panel: Negative Antimicrobials:  Ceftriaxone 5/26 Azithromycin 5/26 Zosyn started 5/26 Interim history/subjective:  Looks worse   Objective   Blood pressure  (Abnormal) 142/70, pulse 89, temperature (Abnormal) 97.4 F (36.3 C), temperature source Oral, resp. rate (Abnormal) 31, height 5\' 10"  (1.778 m), weight 95 kg, SpO2 96 %.    FiO2 (%):  [70 %-100 %] 70 %   Intake/Output Summary (Last 24 hours) at Oct 25, 2019 0835 Last data filed at 2019/10/25 0606 Gross per 24 hour  Intake 240 ml  Output 3500 ml  Net -3260 ml   Filed Weights   09/30/2019 1240 10/17/19 0232  Weight: 96 kg 95 kg    Examination:  General this is a pleasant and now terminally ill appearing white male. He appears comfortable at rest but is still requiring high flow oxygen HENT NCAT + upper airway rhonchi w/ weak cough  pulm decreased/absent breath sounds on right. The chest tube is draining old dark bloody pleural fluid. There is no airleak. The left side of chest demonstrates both rhonchi and rales. He remains on high flow oxygen and w/ any exertion has marked accessory use  Card Regular irreg abd not tender + bowel sounds Ext warm and dry  Neuro more sleepy oriented. Very weak   Resolved Hospital Problem list     Assessment & Plan:  Acute hypoxic respiratory failure in the setting of right lower lobe pneumonia (aspiration vs possibly post-obstructive) with loculated right pleural effusion; can't exclude underlying malignancy  Progressive mucous plugging w/ resultant atelectasis and complete right hemothorax opacification/collapse (new 4/28) Severe sepsis (POA) secondary to pneumonia, no organism specified to date Chronic atrial fibrillation, on anticoagulation Coumadin induced coagulopathy: got vit K. INR now 1.6. still has bloody pleural output  AKI in setting of sepsis: scr cont to decline as of 4/28 HFrEF, currently appears to be compensated Mild hyperkalemia: this has been persistent  History of CLL, currently on surveillance: WBC ct remains elevated. Heme thinks reactive. No  sig change   Pulm problem list Acute hypoxic respiratory failure 2/2 recurrent  aspiration pneumonia, exacerbated further by poor cough mechanics w/  mucous plugging and resultant right sided atelectasis w/ complete opacification of the right hemithorax -PCXR personally reviewed. The right ct is in satisfactory position.  There is now complete opacification of the right hemithorax w/ airway shifting to the right  Plan Cont supplemental oxygen Adding HT saline via metaneb Cont day 3 zosyn  Right sided empyema s/p chest tube 4/27 w/ tpa/dnase x 2 doses resulting in excellent pleural cavity drainage. He has drained 2.2 liters out of his chest tube; still w/ bloody output Plan Will cont chest tube to drainage Will hold off on further TNA/DNase as mucous plugging is the primary issue now and also has sig bloody output Would cont to hold coumadin  I am concerned that he is actively dying. He appears much weaker. His cough mechanics are terrible and he would not survive bronchoscopy and even if he did I suspect he would just plug again. I have spoken w/ his wife Oscar Baker. She understands how serious the situation is. Will swing back later today. Would have very low threshold to switch gears to comfort at this point.    Best practice:  Diet: reg Pain/Anxiety/Delirium protocol (if indicated): na VAP protocol (if indicated): na DVT prophylaxis: holding  GI prophylaxis: na Glucose control: ssi Mobility: BR Code Status: dnr Family Communication: wife updated  Disposition: admit    Oscar Baker ACNP-BC [place] Pager # [phone] OR # [phone] if no answer

## 2019-10-21 NOTE — Progress Notes (Signed)
PROGRESS NOTE  Oscar Baker [id number] DOB: 01/27/1933   PCP: [name], MD  Patient is from: Home  DOA: 10/12/2019 LOS: 2  Brief Narrative / Interim history: 86 y.o.malewith medical history significant ofCLL on remission, CAD, borderline systolic CHF chronic, A. fib on Coumadin,IIDM,HTN, HLD, SSS status post PPM, presented with increasing short of breath dry cough and right-sided pleuritic chest pain for 3 days. Symptoms started suddenly 3 days ago, cough was dry at the beginning and then became productive of greenish sputum, denied any fever or chills, chest pain started 2 days ago, sharp-like located on the right sided rib cage, worsening with cough and movement. Before this, patient had a few days of loose bowel movement, but denied any nauseous vomiting or abdominal pain. And diarrhea subsided few days ago.  He was admitted with acute hypoxic respiratory failure secondary to pneumonia with a loculated effusion.  Pulmonology and oncology following.    He underwent thoracocentesis with removal of exudative fluid by protein.  Fluid cultures negative.  Had chest tube placed on 4/27.  Started on TPA with DNase.  However, patient continued to have respiratory distress requiring higher level of supplemental oxygen.  Eventually, he was transitioned to comfort care by PCCM on 4/28.  Subjective: Seen and examined earlier this morning.  Complains 10/10 pain on the right side of his chest.  Significantly increased work of breathing with gurgling.  He speaks with one word at a time due to increased work of breathing.  Objective: Vitals:   11-07-19 0900 11-07-2019 1029 11/07/19 1100 07-Nov-2019 1555  BP:   (!) 104/58 (!) 84/55  Pulse:  80 78 67  Resp:  (!) 31 (!) 25 (!) 24  Temp:   (!) 97.1 F (36.2 C) (!) 97.4 F (36.3 C)  TempSrc:   Axillary Oral  SpO2: 98% 98% 100%   Weight:      Height:        Intake/Output Summary (Last 24 hours) at Nov 07, 2019 1724 Last data filed at 2019/11/07  1500 Gross per 24 hour  Intake 418.7 ml  Output 1550 ml  Net -1131.3 ml   Filed Weights   10/07/2019 1240 10/17/19 0232  Weight: 96 kg 95 kg    Examination:  GENERAL: Notable distress due to work of breathing. HEENT: MMM.  Vision and hearing grossly intact.  NECK: Supple.  No apparent JVD.  RESP: Significant IWOB.  Rattling and rhonchi bilaterally. CVS:  RRR. Heart sounds normal.  ABD/GI/GU: BS present. Soft.  Significant tenderness over RUQ and lower right chest anteriorly. MSK/EXT:  Moves extremities. No apparent deformity. No edema.  SKIN: no apparent skin lesion or wound NEURO: Awake, alert and oriented self. PSYCH: Appears to be in distress due to increased work of breathing and pain.  Procedures:  4/27-thoracocentesis with chest tube placement  Microbiology summarized: COVID-19 PCR negative. Influenza PCR negative. Blood cultures negative. Pleural fluid cultures negative.  Assessment & Plan: End-of-life care/DNR/DNI/full comfort care -Patient transition to full comfort care by PCCM on 4/28. -Palliative care focused order set utilized -Anticipate in hospital death  Acute Hypoxic Respiratory Failure  Pneumonia with large loculated R sided Effusion  Sepsis -Status post thoracocentesis with chest tube placement -Vancomycin, Zosyn and azithromycin 4/26-4/28 -Transition to comfort care by PCCM on 4/28  Sepsis due to pneumonia as above  Chronic lymphocytic leukemia -Appreciate cardiology input.  History of CAD/NICM  Acute on chronic chronic systolic CHF with RV failure/ischemic cardiomyopathy: Echo with EF of 30 to 35%, akinesis of  distal septum and apex.   Permanent atrial fibrillation/sick sinus syndrome  Coumadin induced coagulopathy: INR 7.2 on presentation.  Now subtherapeutic after reversal with vitamin K.  Hyperkalemia: repeat improved, follow.  Lokelma x1.  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  Discharge barrier: Unstable.  Anticipating in hospital death Patient is from: Home Final disposition: Anticipate in hospital death  Consultants:  PCCM, oncology, cardiology   Sch Meds:  Scheduled Meds: . acetaminophen  1,000 mg Oral Q8H  . hydrocortisone cream   Topical BID  . prednisoLONE acetate  4 drop Both Eyes BID  . sodium chloride flush  10-40 mL Intracatheter Q12H   Continuous Infusions: PRN Meds:.morphine injection, [DISCONTINUED] ondansetron **OR** ondansetron (ZOFRAN) IV, sodium chloride flush  Antimicrobials: Anti-infectives (From admission, onward)   Start     Dose/Rate Route Frequency Ordered Stop   11/06/2019 0900  vancomycin (VANCOREADY) IVPB 1250 mg/250 mL  Status:  Discontinued     1,250 mg 166.7 mL/hr over 90 Minutes Intravenous Every 24 hours 10/17/19 0932 Nov 06, 2019 1406   10/17/19 0800  vancomycin (VANCOREADY) IVPB 2000 mg/400 mL     2,000 mg 200 mL/hr over 120 Minutes Intravenous  Once 10/17/19 0753 06-Nov-2019 0940   10/17/19 0000  piperacillin-tazobactam (ZOSYN) IVPB 3.375 g  Status:  Discontinued     3.375 g 12.5 mL/hr over 240 Minutes Intravenous Every 8 hours 09/21/2019 1625 11/06/2019 1406   09/25/2019 1630  piperacillin-tazobactam (ZOSYN) IVPB 3.375 g     3.375 g 100 mL/hr over 30 Minutes Intravenous  Once 09/27/2019 1538 09/28/2019 2231   09/30/2019 1345  cefTRIAXone (ROCEPHIN) 2 g in sodium chloride 0.9 % 100 mL IVPB     2 g 200 mL/hr over 30 Minutes Intravenous  Once 09/22/2019 1330 10/10/2019 1441   10/03/2019 1345  azithromycin (ZITHROMAX) 500 mg in sodium chloride 0.9 % 250 mL IVPB     500 mg 250 mL/hr over 60 Minutes  Intravenous  Once 10/12/2019 1330 09/22/2019 1501       I have personally reviewed the following labs and images: CBC: Recent Labs  Lab 09/27/2019 1305 10/09/2019 1337 10/17/19 0240 06-Nov-2019 0145  WBC 56.6*  --  70.3* 64.4*  NEUTROABS 21.5*  --   --  21.2*  HGB 12.5* 13.6 12.3* 12.8*  HCT 37.9* 40.0 37.9* 38.5*  MCV 100.8*  --  101.6* 98.7  PLT 391  --  428* 433*   BMP &GFR Recent Labs  Lab 10/13/2019 1305 09/29/2019 1337 10/17/19 0240 10/17/19 0809 11-06-2019 0145  NA 134* 134* 133*  --  135  K 5.2* 5.3* 6.6* 5.1 5.2*  CL 101  --  99  --  101  CO2 21*  --  25  --  18*  GLUCOSE 297*  --  337*  --  242*  BUN 36*  --  44*  --  51*  CREATININE 1.33*  --  1.30*  --  1.38*  CALCIUM 8.8*  --  8.8*  --  8.3*  MG  --   --   --   --  2.0  PHOS  --   --   --   --  4.1   Estimated Creatinine Clearance: 45.3 mL/min (A) (by C-G formula based on SCr of 1.38 mg/dL (H)). Liver & Pancreas: Recent Labs  Lab 10/01/2019 1305 10-21-2019 0145  AST 25 23  ALT 15 14  ALKPHOS 80 68  BILITOT 1.3* 1.3*  PROT 6.8 5.7*  ALBUMIN 2.9* 2.3*   No results for input(s): LIPASE, AMYLASE in the last 168 hours. No results for input(s): AMMONIA in the last 168 hours. Diabetic: Recent Labs    10/03/2019 1454  HGBA1C 6.9*   Recent Labs  Lab 10/17/19 1713 10/17/19 2105 10/21/2019 0833 10-21-19 1141 2019/10/21 1554  GLUCAP 179* 227* 263* 260* 208*   Cardiac Enzymes: No results for input(s): CKTOTAL, CKMB, CKMBINDEX, TROPONINI in the last 168 hours. No results for input(s): PROBNP in the last 8760 hours. Coagulation Profile: Recent Labs  Lab 10/19/2019 1305 10/17/19 0240 10/21/19 0145  INR 7.2* 1.8* 1.6*   Thyroid Function Tests: No results for input(s): TSH, T4TOTAL, FREET4, T3FREE, THYROIDAB in the last 72 hours. Lipid Profile: No results for input(s): CHOL, HDL, LDLCALC, TRIG, CHOLHDL, LDLDIRECT in the last 72 hours. Anemia Panel: No results for input(s): VITAMINB12, FOLATE, FERRITIN, TIBC, IRON,  RETICCTPCT in the last 72 hours. Urine analysis:    Component Value Date/Time   COLORURINE YELLOW 10/17/2019 2320   APPEARANCEUR CLOUDY (A) 10/17/2019 2320   LABSPEC 1.019 10/17/2019 2320   PHURINE 5.0 10/17/2019 2320   GLUCOSEU 50 (A) 10/17/2019 2320   HGBUR NEGATIVE 10/17/2019 2320   [place] 10/17/2019 2320   KETONESUR NEGATIVE 10/17/2019 2320   PROTEINUR NEGATIVE 10/17/2019 2320   NITRITE NEGATIVE 10/17/2019 2320   LEUKOCYTESUR NEGATIVE 10/17/2019 2320   Sepsis Labs: Invalid input(s): PROCALCITONIN, [place]  Microbiology: Recent Results (from the past 240 hour(s))  Blood Culture (routine x 2)     Status: None (Preliminary result)   Collection Time: 10/02/2019 12:55 PM   Specimen: BLOOD  Result Value Ref Range Status   Specimen Description BLOOD RIGHT ANTECUBITAL  Final   Special Requests   Final    BOTTLES DRAWN AEROBIC AND ANAEROBIC Blood Culture adequate volume   Culture   Final    NO GROWTH 2 DAYS Performed at [facility], [address] [place], [place] [address]    Report Status PENDING  Incomplete  Respiratory Panel by RT PCR (Flu A&B, Covid) - Nasopharyngeal Swab     Status: None   Collection Time: 10/05/2019  2:11 PM   Specimen: Nasopharyngeal Swab  Result Value Ref Range Status   SARS Coronavirus 2 by RT PCR NEGATIVE NEGATIVE Final    Comment: (NOTE) SARS-CoV-2 target nucleic acids are NOT DETECTED. The SARS-CoV-2 RNA is generally detectable in upper respiratoy specimens during the acute phase of infection. The lowest concentration of SARS-CoV-2 viral copies this assay can detect is 131 copies/mL. A negative result does not preclude SARS-Cov-2 infection and should not be used as the sole basis for treatment or other patient management decisions. A negative result may occur with  improper specimen collection/handling, submission of specimen other than nasopharyngeal swab, presence of viral mutation(s) within the areas targeted by this  assay, and inadequate number of viral copies (<131 copies/mL). A negative result must be combined with clinical observations, patient history, and epidemiological information. The expected result is Negative. Fact Sheet for Patients:  PinkCheek.be Fact Sheet for Healthcare Providers:  GravelBags.it This test is not yet ap proved or cleared by the [place] FDA and  has been authorized for detection and/or  diagnosis of SARS-CoV-2 by FDA under an Emergency Use Authorization (EUA). This EUA will remain  in effect (meaning this test can be used) for the duration of the COVID-19 declaration under Section 564(b)(1) of the Act, 21 U.S.C. section 360bbb-3(b)(1), unless the authorization is terminated or revoked sooner.    Influenza A by PCR NEGATIVE NEGATIVE Final   Influenza B by PCR NEGATIVE NEGATIVE Final    Comment: (NOTE) The Xpert Xpress SARS-CoV-2/FLU/RSV assay is intended as an aid in  the diagnosis of influenza from Nasopharyngeal swab specimens and  should not be used as a sole basis for treatment. Nasal washings and  aspirates are unacceptable for Xpert Xpress SARS-CoV-2/FLU/RSV  testing. Fact Sheet for Patients: PinkCheek.be Fact Sheet for Healthcare Providers: GravelBags.it This test is not yet approved or cleared by the [place] FDA and  has been authorized for detection and/or diagnosis of SARS-CoV-2 by  FDA under an Emergency Use Authorization (EUA). This EUA will remain  in effect (meaning this test can be used) for the duration of the  Covid-19 declaration under Section 564(b)(1) of the Act, 21  U.S.C. section 360bbb-3(b)(1), unless the authorization is  terminated or revoked. Performed at [facility], [place] [address], [place], [place] [address]   Blood Culture (routine x 2)     Status: None (Preliminary result)   Collection Time: 10/15/2019   2:58 PM   Specimen: BLOOD  Result Value Ref Range Status   Specimen Description BLOOD BLOOD RIGHT FOREARM  Final   Special Requests   Final    BOTTLES DRAWN AEROBIC AND ANAEROBIC Blood Culture adequate volume   Culture   Final    NO GROWTH 2 DAYS Performed at [facility], [place] [address], [place], [place] [address]    Report Status PENDING  Incomplete  Body fluid culture (includes gram stain)     Status: None (Preliminary result)   Collection Time: 10/17/19  8:22 AM   Specimen: Pleural Fluid  Result Value Ref Range Status   Specimen Description FLUID  Final   Special Requests PLEURAL  Final   Gram Stain   Final    MODERATE WBC PRESENT, PREDOMINANTLY PMN NO ORGANISMS SEEN    Culture   Final    NO GROWTH < 24 HOURS Performed at [facility], [place] [address] [place], [place] [address]    Report Status PENDING  Incomplete    Radiology Studies: DG Chest Port 1 View  Result Date: Oct 27, 2019 CLINICAL DATA:  Respiratory failure EXAM: PORTABLE CHEST 1 VIEW COMPARISON:  October 17, 2019 chest radiograph and October 16, 2019 chest CT FINDINGS: Chest tube again noted on the right with apparent kinks in the chest tube in the lower right hemithorax region, similar to 1 day prior. The previously noted pneumothorax on the right is not evident currently. There is currently diffuse opacification of the right lung, likely due to combination of consolidation and pleural effusion. Left lung is clear. Heart is borderline enlarged with pulmonary vascularity on the left normal and pulmonary vascularity on the right obscured. No adenopathy evident in areas that can be assessed. There is aortic atherosclerosis. There is a sclerotic lesion in the proximal left humerus, stable. IMPRESSION: 1. Diffuse consolidation right lung, likely due to combination of consolidation and pleural effusion. An underlying mass cannot be excluded in the right lung region. There is concern for right perihilar mass on recent  CT. 2.  Left lung clear. 3. Stable cardiac prominence. Pacemaker lead  attached to right ventricle. 4. Sclerotic area in the proximal left humerus with chondroid matrix. Question bone infarct or enchondroma. Chondrosarcoma potentially could present in this manner as was noted on recent radiographic report. Dedicated left humerus radiographs advised in this regard. Electronically Signed   By: Lowella Grip III M.D.   On: 10-28-19 08:01     Sonia Stickels T. B[place]  If 7PM-7AM, please contact night-coverage [email or web address] Password [facility] 10/28/19, 5:24 PM

## 2019-10-21 NOTE — Progress Notes (Signed)
SLP Cancellation Note  Patient Details Name: Oscar Baker MRN: [id number] DOB: 1932/10/09   Cancelled treatment:       Reason Eval/Treat Not Completed: Medical issues which prohibited therapy. Pt is known to this SLP from previous admissions. Swallow evaluation was ordered for this admission but he appears to have declined, and plan is now to transition him to full comfort. SLP will defer evaluation for now, but please reorder if we can be of assistance.     Osie Bond., M.A. [facility] Pager [phone] Office [phone]  Oct 31, 2019, 2:23 PM

## 2019-10-21 NOTE — Progress Notes (Signed)
Attempted to suction patient orally however he pulled the yankauer.

## 2019-10-21 NOTE — Progress Notes (Signed)
Called to room by wife. Breath sounds absent, no detectable pulse. Pacer strikes seen on monitor. Dr Myna Hidalgo notified. Family at bedside. Time of death 62.

## 2019-10-21 DEATH — deceased

## 2019-10-25 ENCOUNTER — Ambulatory Visit: Payer: Medicare Other | Admitting: Podiatry

## 2019-11-14 ENCOUNTER — Other Ambulatory Visit: Payer: Medicare Other

## 2019-11-14 ENCOUNTER — Ambulatory Visit: Payer: Medicare Other | Admitting: Oncology

## 2019-11-17 ENCOUNTER — Ambulatory Visit: Payer: Medicare Other | Admitting: Oncology

## 2019-11-17 ENCOUNTER — Other Ambulatory Visit: Payer: Medicare Other
